# Patient Record
Sex: Female | Born: 1950 | Race: White | Hispanic: No | Smoking: Never smoker
Health system: Southern US, Community
[De-identification: ages and names within clinical notes are randomized; demographics above are authoritative.]

## PROBLEM LIST (undated history)

## (undated) DIAGNOSIS — N83209 Unspecified ovarian cyst, unspecified side: Secondary | ICD-10-CM

## (undated) DIAGNOSIS — I428 Other cardiomyopathies: Secondary | ICD-10-CM

## (undated) DIAGNOSIS — R9430 Abnormal result of cardiovascular function study, unspecified: Secondary | ICD-10-CM

## (undated) DIAGNOSIS — I340 Nonrheumatic mitral (valve) insufficiency: Secondary | ICD-10-CM

## (undated) DIAGNOSIS — Z7901 Long term (current) use of anticoagulants: Secondary | ICD-10-CM

## (undated) DIAGNOSIS — J449 Chronic obstructive pulmonary disease, unspecified: Secondary | ICD-10-CM

## (undated) DIAGNOSIS — I6509 Occlusion and stenosis of unspecified vertebral artery: Secondary | ICD-10-CM

## (undated) DIAGNOSIS — I480 Paroxysmal atrial fibrillation: Secondary | ICD-10-CM

## (undated) DIAGNOSIS — I639 Cerebral infarction, unspecified: Secondary | ICD-10-CM

## (undated) DIAGNOSIS — E039 Hypothyroidism, unspecified: Secondary | ICD-10-CM

## (undated) DIAGNOSIS — I509 Heart failure, unspecified: Secondary | ICD-10-CM

## (undated) DIAGNOSIS — K922 Gastrointestinal hemorrhage, unspecified: Secondary | ICD-10-CM

## (undated) DIAGNOSIS — Z9889 Other specified postprocedural states: Secondary | ICD-10-CM

## (undated) DIAGNOSIS — Z8679 Personal history of other diseases of the circulatory system: Secondary | ICD-10-CM

## (undated) DIAGNOSIS — M1990 Unspecified osteoarthritis, unspecified site: Secondary | ICD-10-CM

## (undated) DIAGNOSIS — I272 Pulmonary hypertension, unspecified: Secondary | ICD-10-CM

## (undated) DIAGNOSIS — B009 Herpesviral infection, unspecified: Secondary | ICD-10-CM

## (undated) DIAGNOSIS — M810 Age-related osteoporosis without current pathological fracture: Secondary | ICD-10-CM

## (undated) DIAGNOSIS — C50919 Malignant neoplasm of unspecified site of unspecified female breast: Secondary | ICD-10-CM

## (undated) DIAGNOSIS — G35 Multiple sclerosis: Secondary | ICD-10-CM

## (undated) DIAGNOSIS — K589 Irritable bowel syndrome without diarrhea: Secondary | ICD-10-CM

## (undated) DIAGNOSIS — F419 Anxiety disorder, unspecified: Secondary | ICD-10-CM

## (undated) DIAGNOSIS — Z952 Presence of prosthetic heart valve: Secondary | ICD-10-CM

## (undated) DIAGNOSIS — I493 Ventricular premature depolarization: Secondary | ICD-10-CM

## (undated) DIAGNOSIS — N319 Neuromuscular dysfunction of bladder, unspecified: Secondary | ICD-10-CM

## (undated) HISTORY — DX: Gastrointestinal hemorrhage, unspecified: K92.2

## (undated) HISTORY — DX: Age-related osteoporosis without current pathological fracture: M81.0

## (undated) HISTORY — DX: Irritable bowel syndrome, unspecified: K58.9

## (undated) HISTORY — DX: Ventricular premature depolarization: I49.3

## (undated) HISTORY — DX: Occlusion and stenosis of unspecified vertebral artery: I65.09

## (undated) HISTORY — DX: Pulmonary hypertension, unspecified: I27.20

## (undated) HISTORY — PX: LYMPHADENECTOMY: SHX15

## (undated) HISTORY — DX: Paroxysmal atrial fibrillation: I48.0

## (undated) HISTORY — DX: Other cardiomyopathies: I42.8

## (undated) HISTORY — DX: Herpesviral infection, unspecified: B00.9

## (undated) HISTORY — DX: Neuromuscular dysfunction of bladder, unspecified: N31.9

## (undated) HISTORY — DX: Chronic obstructive pulmonary disease, unspecified: J44.9

## (undated) HISTORY — DX: Cerebral infarction, unspecified: I63.9

## (undated) HISTORY — DX: Long term (current) use of anticoagulants: Z79.01

## (undated) HISTORY — DX: Anxiety disorder, unspecified: F41.9

## (undated) HISTORY — DX: Abnormal result of cardiovascular function study, unspecified: R94.30

---
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
## 1968-10-19 HISTORY — PX: TONSILLECTOMY: SUR1361

## 1976-10-19 HISTORY — PX: LAPAROSCOPIC OVARIAN CYSTECTOMY: SUR786

## 1990-10-19 HISTORY — PX: CYSTOSCOPY: SUR368

## 1997-10-19 DIAGNOSIS — C50919 Malignant neoplasm of unspecified site of unspecified female breast: Secondary | ICD-10-CM

## 1997-10-19 HISTORY — PX: BREAST LUMPECTOMY: SHX2

## 1997-10-19 HISTORY — DX: Malignant neoplasm of unspecified site of unspecified female breast: C50.919

## 1998-06-19 DIAGNOSIS — Z853 Personal history of malignant neoplasm of breast: Secondary | ICD-10-CM | POA: Insufficient documentation

## 1998-07-09 ENCOUNTER — Encounter: Admission: RE | Admit: 1998-07-09 | Discharge: 1998-10-07 | Payer: Self-pay | Admitting: Surgery

## 1998-07-19 ENCOUNTER — Ambulatory Visit (HOSPITAL_COMMUNITY): Admission: RE | Admit: 1998-07-19 | Discharge: 1998-07-20 | Payer: Self-pay | Admitting: Surgery

## 1998-07-21 ENCOUNTER — Emergency Department (HOSPITAL_COMMUNITY): Admission: EM | Admit: 1998-07-21 | Discharge: 1998-07-21 | Payer: Self-pay | Admitting: Emergency Medicine

## 1998-08-06 ENCOUNTER — Encounter: Payer: Self-pay | Admitting: Surgery

## 1998-08-06 ENCOUNTER — Ambulatory Visit (HOSPITAL_BASED_OUTPATIENT_CLINIC_OR_DEPARTMENT_OTHER): Admission: RE | Admit: 1998-08-06 | Discharge: 1998-08-06 | Payer: Self-pay | Admitting: Surgery

## 1998-08-07 ENCOUNTER — Ambulatory Visit (HOSPITAL_COMMUNITY): Admission: RE | Admit: 1998-08-07 | Discharge: 1998-08-07 | Payer: Self-pay | Admitting: Hematology and Oncology

## 1998-08-07 ENCOUNTER — Encounter: Payer: Self-pay | Admitting: Hematology and Oncology

## 1998-10-14 ENCOUNTER — Encounter: Admission: RE | Admit: 1998-10-14 | Discharge: 1999-01-12 | Payer: Self-pay | Admitting: Radiation Oncology

## 1998-11-08 ENCOUNTER — Ambulatory Visit (HOSPITAL_BASED_OUTPATIENT_CLINIC_OR_DEPARTMENT_OTHER): Admission: RE | Admit: 1998-11-08 | Discharge: 1998-11-08 | Payer: Self-pay | Admitting: Surgery

## 1999-09-17 ENCOUNTER — Encounter: Payer: Self-pay | Admitting: Hematology and Oncology

## 1999-09-17 ENCOUNTER — Encounter: Admission: RE | Admit: 1999-09-17 | Discharge: 1999-09-17 | Payer: Self-pay | Admitting: Hematology and Oncology

## 1999-10-01 ENCOUNTER — Encounter: Payer: Self-pay | Admitting: Hematology and Oncology

## 1999-11-28 ENCOUNTER — Ambulatory Visit (HOSPITAL_COMMUNITY): Admission: RE | Admit: 1999-11-28 | Discharge: 1999-11-28 | Payer: Self-pay | Admitting: Internal Medicine

## 2000-04-05 ENCOUNTER — Encounter: Payer: Self-pay | Admitting: Oncology

## 2000-04-05 ENCOUNTER — Encounter: Admission: RE | Admit: 2000-04-05 | Discharge: 2000-04-05 | Payer: Self-pay | Admitting: Oncology

## 2000-10-26 ENCOUNTER — Other Ambulatory Visit: Admission: RE | Admit: 2000-10-26 | Discharge: 2000-10-26 | Payer: Self-pay | Admitting: *Deleted

## 2001-11-23 ENCOUNTER — Other Ambulatory Visit: Admission: RE | Admit: 2001-11-23 | Discharge: 2001-11-23 | Payer: Self-pay | Admitting: *Deleted

## 2002-12-11 ENCOUNTER — Other Ambulatory Visit: Admission: RE | Admit: 2002-12-11 | Discharge: 2002-12-11 | Payer: Self-pay | Admitting: *Deleted

## 2003-08-28 ENCOUNTER — Ambulatory Visit (HOSPITAL_COMMUNITY): Admission: RE | Admit: 2003-08-28 | Discharge: 2003-08-28 | Payer: Self-pay | Admitting: Oncology

## 2004-01-22 ENCOUNTER — Other Ambulatory Visit: Admission: RE | Admit: 2004-01-22 | Discharge: 2004-01-22 | Payer: Self-pay | Admitting: *Deleted

## 2004-07-13 ENCOUNTER — Emergency Department (HOSPITAL_COMMUNITY): Admission: EM | Admit: 2004-07-13 | Discharge: 2004-07-13 | Payer: Self-pay | Admitting: Emergency Medicine

## 2004-11-18 ENCOUNTER — Ambulatory Visit: Payer: Self-pay | Admitting: Family Medicine

## 2004-12-05 ENCOUNTER — Ambulatory Visit: Payer: Self-pay | Admitting: Family Medicine

## 2005-02-03 ENCOUNTER — Ambulatory Visit: Payer: Self-pay | Admitting: Oncology

## 2005-04-07 ENCOUNTER — Ambulatory Visit: Payer: Self-pay | Admitting: Internal Medicine

## 2006-02-12 ENCOUNTER — Ambulatory Visit: Payer: Self-pay | Admitting: Oncology

## 2006-02-15 LAB — CBC WITH DIFFERENTIAL/PLATELET
BASO%: 0.7 % (ref 0.0–2.0)
EOS%: 1.5 % (ref 0.0–7.0)
MCH: 33.4 pg (ref 26.0–34.0)
MCHC: 34.8 g/dL (ref 32.0–36.0)
MCV: 96 fL (ref 81.0–101.0)
NEUT%: 61.9 % (ref 39.6–76.8)
RBC: 4.07 10*6/uL (ref 3.70–5.32)
RDW: 12.3 % (ref 11.3–14.5)

## 2006-02-15 LAB — COMPREHENSIVE METABOLIC PANEL
Alkaline Phosphatase: 51 U/L (ref 39–117)
BUN: 24 mg/dL — ABNORMAL HIGH (ref 6–23)
CO2: 27 mEq/L (ref 19–32)
Glucose, Bld: 94 mg/dL (ref 70–99)
Total Bilirubin: 0.4 mg/dL (ref 0.3–1.2)
Total Protein: 6.4 g/dL (ref 6.0–8.3)

## 2006-02-17 ENCOUNTER — Encounter: Payer: Self-pay | Admitting: Surgery

## 2006-03-04 ENCOUNTER — Ambulatory Visit: Payer: Self-pay | Admitting: Family Medicine

## 2006-03-18 ENCOUNTER — Ambulatory Visit: Payer: Self-pay | Admitting: Family Medicine

## 2006-05-14 ENCOUNTER — Ambulatory Visit: Payer: Self-pay | Admitting: Internal Medicine

## 2006-05-18 ENCOUNTER — Ambulatory Visit: Payer: Self-pay | Admitting: Family Medicine

## 2006-05-25 ENCOUNTER — Ambulatory Visit: Payer: Self-pay | Admitting: Family Medicine

## 2006-09-23 ENCOUNTER — Ambulatory Visit: Payer: Self-pay | Admitting: Family Medicine

## 2006-09-23 ENCOUNTER — Encounter: Admission: RE | Admit: 2006-09-23 | Discharge: 2006-09-23 | Payer: Self-pay | Admitting: Family Medicine

## 2006-11-30 ENCOUNTER — Ambulatory Visit: Payer: Self-pay | Admitting: Internal Medicine

## 2007-01-19 DIAGNOSIS — M949 Disorder of cartilage, unspecified: Secondary | ICD-10-CM

## 2007-01-19 DIAGNOSIS — Z8719 Personal history of other diseases of the digestive system: Secondary | ICD-10-CM

## 2007-01-19 DIAGNOSIS — R32 Unspecified urinary incontinence: Secondary | ICD-10-CM

## 2007-01-19 DIAGNOSIS — G35 Multiple sclerosis: Secondary | ICD-10-CM

## 2007-01-19 DIAGNOSIS — M899 Disorder of bone, unspecified: Secondary | ICD-10-CM | POA: Insufficient documentation

## 2007-01-19 DIAGNOSIS — Z87898 Personal history of other specified conditions: Secondary | ICD-10-CM

## 2007-01-19 DIAGNOSIS — L719 Rosacea, unspecified: Secondary | ICD-10-CM

## 2007-01-19 DIAGNOSIS — G35D Multiple sclerosis, unspecified: Secondary | ICD-10-CM | POA: Insufficient documentation

## 2007-02-09 ENCOUNTER — Ambulatory Visit: Payer: Self-pay | Admitting: Oncology

## 2007-02-14 LAB — CBC WITH DIFFERENTIAL/PLATELET
Basophils Absolute: 0 10*3/uL (ref 0.0–0.1)
EOS%: 1.8 % (ref 0.0–7.0)
HGB: 14.2 g/dL (ref 11.6–15.9)
MCH: 34.2 pg — ABNORMAL HIGH (ref 26.0–34.0)
MONO#: 0.6 10*3/uL (ref 0.1–0.9)
NEUT#: 2.8 10*3/uL (ref 1.5–6.5)
RDW: 12.7 % (ref 11.3–14.5)
WBC: 4.9 10*3/uL (ref 3.9–10.0)
lymph#: 1.3 10*3/uL (ref 0.9–3.3)

## 2007-02-14 LAB — COMPREHENSIVE METABOLIC PANEL
BUN: 20 mg/dL (ref 6–23)
Calcium: 9.5 mg/dL (ref 8.4–10.5)
Chloride: 105 mEq/L (ref 96–112)
Creatinine, Ser: 0.93 mg/dL (ref 0.40–1.20)
Glucose, Bld: 89 mg/dL (ref 70–99)
Potassium: 4 mEq/L (ref 3.5–5.3)
Total Bilirubin: 0.4 mg/dL (ref 0.3–1.2)

## 2007-02-14 LAB — CANCER ANTIGEN 27.29: CA 27.29: 39 U/mL (ref 0–39)

## 2007-04-20 ENCOUNTER — Ambulatory Visit: Payer: Self-pay | Admitting: Oncology

## 2007-04-26 LAB — CANCER ANTIGEN 27.29: CA 27.29: 27 U/mL (ref 0–39)

## 2007-11-18 ENCOUNTER — Encounter: Payer: Self-pay | Admitting: Family Medicine

## 2007-12-20 ENCOUNTER — Encounter: Payer: Self-pay | Admitting: Family Medicine

## 2008-02-15 ENCOUNTER — Ambulatory Visit: Payer: Self-pay | Admitting: Oncology

## 2008-02-20 LAB — CBC WITH DIFFERENTIAL/PLATELET
Basophils Absolute: 0 10*3/uL (ref 0.0–0.1)
Eosinophils Absolute: 0.1 10*3/uL (ref 0.0–0.5)
HCT: 39.3 % (ref 34.8–46.6)
LYMPH%: 28.3 % (ref 14.0–48.0)
MONO#: 0.5 10*3/uL (ref 0.1–0.9)
MONO%: 8.9 % (ref 0.0–13.0)
NEUT%: 61.4 % (ref 39.6–76.8)

## 2008-02-21 LAB — COMPREHENSIVE METABOLIC PANEL
AST: 18 U/L (ref 0–37)
Albumin: 4.1 g/dL (ref 3.5–5.2)
Alkaline Phosphatase: 69 U/L (ref 39–117)
BUN: 16 mg/dL (ref 6–23)
Calcium: 10.1 mg/dL (ref 8.4–10.5)
Creatinine, Ser: 0.89 mg/dL (ref 0.40–1.20)
Glucose, Bld: 102 mg/dL — ABNORMAL HIGH (ref 70–99)
Total Protein: 6.8 g/dL (ref 6.0–8.3)

## 2008-02-27 ENCOUNTER — Encounter: Payer: Self-pay | Admitting: Internal Medicine

## 2008-10-19 HISTORY — PX: COLONOSCOPY: SHX174

## 2008-12-21 ENCOUNTER — Encounter: Payer: Self-pay | Admitting: Family Medicine

## 2008-12-27 ENCOUNTER — Encounter: Payer: Self-pay | Admitting: Family Medicine

## 2009-02-05 ENCOUNTER — Ambulatory Visit: Payer: Self-pay | Admitting: Internal Medicine

## 2009-02-06 ENCOUNTER — Telehealth: Payer: Self-pay | Admitting: Internal Medicine

## 2009-02-15 ENCOUNTER — Ambulatory Visit: Payer: Self-pay | Admitting: Oncology

## 2009-02-22 ENCOUNTER — Ambulatory Visit: Payer: Self-pay | Admitting: Internal Medicine

## 2009-02-26 ENCOUNTER — Encounter: Payer: Self-pay | Admitting: Internal Medicine

## 2009-02-26 ENCOUNTER — Encounter: Payer: Self-pay | Admitting: Family Medicine

## 2010-02-18 ENCOUNTER — Ambulatory Visit: Payer: Self-pay | Admitting: Oncology

## 2010-02-19 ENCOUNTER — Encounter: Payer: Self-pay | Admitting: Family Medicine

## 2010-02-19 LAB — CBC WITH DIFFERENTIAL/PLATELET
BASO%: 0.3 % (ref 0.0–2.0)
Basophils Absolute: 0 10*3/uL (ref 0.0–0.1)
EOS%: 1.4 % (ref 0.0–7.0)
MCH: 33 pg (ref 25.1–34.0)
MCHC: 34.5 g/dL (ref 31.5–36.0)
MCV: 95.8 fL (ref 79.5–101.0)
MONO%: 8.4 % (ref 0.0–14.0)
NEUT#: 4.6 10*3/uL (ref 1.5–6.5)
NEUT%: 71.4 % (ref 38.4–76.8)
RDW: 13.1 % (ref 11.2–14.5)
WBC: 6.4 10*3/uL (ref 3.9–10.3)

## 2010-02-19 LAB — COMPREHENSIVE METABOLIC PANEL
AST: 17 U/L (ref 0–37)
Alkaline Phosphatase: 61 U/L (ref 39–117)
BUN: 21 mg/dL (ref 6–23)
Creatinine, Ser: 0.93 mg/dL (ref 0.40–1.20)
Potassium: 4 mEq/L (ref 3.5–5.3)

## 2010-02-26 ENCOUNTER — Encounter: Payer: Self-pay | Admitting: Internal Medicine

## 2010-03-05 ENCOUNTER — Encounter: Payer: Self-pay | Admitting: Family Medicine

## 2010-10-19 HISTORY — PX: WRIST SURGERY: SHX841

## 2010-11-18 NOTE — Letter (Signed)
[facility]   Imported By: Sherian Rein 03/27/2010 13:55:29  _____________________________________________________________________  External Attachment:    Type:   Image     Comment:   External Document

## 2010-11-18 NOTE — Letter (Signed)
Summary: [facility]   Imported By: Lanelle Bal 03/26/2010 10:54:16  _____________________________________________________________________  External Attachment:    Type:   Image     Comment:   External Document

## 2010-12-15 ENCOUNTER — Telehealth: Payer: Self-pay | Admitting: Family Medicine

## 2010-12-25 NOTE — Progress Notes (Signed)
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 Pt is due to have some dental work done and would like to know if she needs to be premedicated. Pt note that in the pass for a while she was premedicated but here lately she has not. Pt has not been seen in years by you pls advise if you would prefer to see Pt. Pls advise...Marland KitchenMarland KitchenFelecia Deloach CMA  December 15, 2010 10:08 AM   Follow-up for Phone Call        now no premedication is needed but since she has not been seen here in a while ---I don't know what has been done since I have seen her---which has been several years---she would need to be seen for me to evaluated her and do appropriate testing.   Follow-up by: Loreen Freud DO,  December 15, 2010 11:30 AM  Additional Follow-up for Phone Call Additional follow up Details #1::        spoke with pt and I advised of above-- stated she sees a functional medicine doctor who gives her a physical every year with labs, I advised patient if he is acting as her PCP then she needs to call there for medical advice, unless she is going to continue her medical care there, she voiced understanding... Stated she may come back to see Dr.Lowne soon, but since Dr. Laury Axon said no she will go to her dental appt, I advised she needed to get medical advice from her pcp, she thank me and ended call Additional Follow-up by: Almeta Monas CMA Duncan Dull),  December 15, 2010 11:57 AM

## 2011-01-17 ENCOUNTER — Emergency Department (HOSPITAL_BASED_OUTPATIENT_CLINIC_OR_DEPARTMENT_OTHER)
Admission: EM | Admit: 2011-01-17 | Discharge: 2011-01-17 | Disposition: A | Discharge status: Home / Self Care | Payer: Medicare Other | Attending: Emergency Medicine | Admitting: Emergency Medicine

## 2011-01-17 DIAGNOSIS — E039 Hypothyroidism, unspecified: Secondary | ICD-10-CM | POA: Insufficient documentation

## 2011-01-17 DIAGNOSIS — W268XXA Contact with other sharp object(s), not elsewhere classified, initial encounter: Secondary | ICD-10-CM | POA: Insufficient documentation

## 2011-01-17 DIAGNOSIS — Y92009 Unspecified place in unspecified non-institutional (private) residence as the place of occurrence of the external cause: Secondary | ICD-10-CM | POA: Insufficient documentation

## 2011-01-17 DIAGNOSIS — S61209A Unspecified open wound of unspecified finger without damage to nail, initial encounter: Secondary | ICD-10-CM | POA: Insufficient documentation

## 2011-02-02 ENCOUNTER — Emergency Department (HOSPITAL_BASED_OUTPATIENT_CLINIC_OR_DEPARTMENT_OTHER)
Admission: EM | Admit: 2011-02-02 | Discharge: 2011-02-02 | Disposition: A | Discharge status: Home / Self Care | Payer: Medicare Other | Attending: Emergency Medicine | Admitting: Emergency Medicine

## 2011-02-02 DIAGNOSIS — H1010 Acute atopic conjunctivitis, unspecified eye: Secondary | ICD-10-CM | POA: Insufficient documentation

## 2011-02-02 DIAGNOSIS — E039 Hypothyroidism, unspecified: Secondary | ICD-10-CM | POA: Insufficient documentation

## 2011-02-19 ENCOUNTER — Other Ambulatory Visit: Payer: Self-pay | Admitting: Oncology

## 2011-02-19 ENCOUNTER — Encounter (HOSPITAL_BASED_OUTPATIENT_CLINIC_OR_DEPARTMENT_OTHER): Payer: Medicare Other | Admitting: Oncology

## 2011-02-19 DIAGNOSIS — C50919 Malignant neoplasm of unspecified site of unspecified female breast: Secondary | ICD-10-CM

## 2011-02-19 DIAGNOSIS — Z170 Estrogen receptor positive status [ER+]: Secondary | ICD-10-CM

## 2011-02-19 LAB — COMPREHENSIVE METABOLIC PANEL
BUN: 23 mg/dL (ref 6–23)
CO2: 31 mEq/L (ref 19–32)
Calcium: 10.1 mg/dL (ref 8.4–10.5)
Chloride: 98 mEq/L (ref 96–112)
Creatinine, Ser: 0.82 mg/dL (ref 0.40–1.20)
Glucose, Bld: 100 mg/dL — ABNORMAL HIGH (ref 70–99)

## 2011-02-19 LAB — CBC WITH DIFFERENTIAL/PLATELET
BASO%: 0.9 % (ref 0.0–2.0)
EOS%: 1.6 % (ref 0.0–7.0)
HCT: 40.3 % (ref 34.8–46.6)
LYMPH%: 25.7 % (ref 14.0–49.7)
MCH: 33.2 pg (ref 25.1–34.0)
MCHC: 35 g/dL (ref 31.5–36.0)
MCV: 94.8 fL (ref 79.5–101.0)
MONO%: 10.9 % (ref 0.0–14.0)
NEUT%: 60.9 % (ref 38.4–76.8)
lymph#: 1.3 10*3/uL (ref 0.9–3.3)

## 2011-02-19 LAB — CANCER ANTIGEN 27.29: CA 27.29: 36 U/mL (ref 0–39)

## 2011-02-24 ENCOUNTER — Emergency Department (HOSPITAL_COMMUNITY): Payer: Medicare Other

## 2011-02-24 ENCOUNTER — Inpatient Hospital Stay (HOSPITAL_COMMUNITY)
Admission: EM | Admit: 2011-02-24 | Discharge: 2011-03-03 | DRG: 512 | Disposition: A | Discharge status: Home / Self Care | Payer: Medicare Other | Attending: Orthopedic Surgery | Admitting: Orthopedic Surgery

## 2011-02-24 DIAGNOSIS — W19XXXA Unspecified fall, initial encounter: Secondary | ICD-10-CM | POA: Diagnosis present

## 2011-02-24 DIAGNOSIS — Z853 Personal history of malignant neoplasm of breast: Secondary | ICD-10-CM

## 2011-02-24 DIAGNOSIS — S52509B Unspecified fracture of the lower end of unspecified radius, initial encounter for open fracture type I or II: Principal | ICD-10-CM | POA: Diagnosis present

## 2011-02-24 LAB — BASIC METABOLIC PANEL
BUN: 20 mg/dL (ref 6–23)
Calcium: 9.5 mg/dL (ref 8.4–10.5)
Creatinine, Ser: 0.64 mg/dL (ref 0.4–1.2)
GFR calc Af Amer: >60 mL/min (ref >60)
GFR calc non Af Amer: >60 mL/min (ref >60)

## 2011-02-24 LAB — CBC
HCT: 38.6 % (ref 36.0–46.0)
Hemoglobin: 13.3 g/dL (ref 12.0–15.0)
MCH: 32.2 pg (ref 26.0–34.0)
MCHC: 34.5 g/dL (ref 30.0–36.0)
MCV: 93.5 fL (ref 78.0–100.0)
Platelets: 205 10*3/uL (ref 150–400)
RDW: 12.6 % (ref 11.5–15.5)

## 2011-02-24 LAB — DIFFERENTIAL
Lymphocytes Relative: 6 % — ABNORMAL LOW (ref 12–46)
Lymphs Abs: 0.7 10*3/uL (ref 0.7–4.0)
Monocytes Absolute: 0.6 10*3/uL (ref 0.1–1.0)
Monocytes Relative: 5 % (ref 3–12)
Neutro Abs: 11.4 10*3/uL — ABNORMAL HIGH (ref 1.7–7.7)

## 2011-03-06 NOTE — Assessment & Plan Note (Signed)
[facility]                         GASTROENTEROLOGY OFFICE NOTE   NAME:Poche, ESMA KILTS                     MRN:          [id number]  DATE:11/30/2006                            DOB:          [birth year]    HISTORY OF PRESENT ILLNESS:  Ms. Abdelrahman is a 60 year old white female  who is due to a colonoscopy. She is here to discuss colonoscopy  procedure. She has a history of breast cancer and family history of  colon cancer. She was actually scheduled for a colonoscopy 3 years ago  but because of diarrhea, she wanted to wait until her symptoms  stabilized. We did her colonoscopy in February 2001 and she had a normal  examination. Since her last visit, her symptoms have stabilized. She is  now having formed stools, mostly due to taking in fiber and taking pro-  biotics. She has been improved only for the last 4 weeks. Prior to that,  her stools were rather loose.   We have discussed colonoscopy today. She will be ready for it in June  2008. She will, for the time being, continue her high fiber diet, then  pro-biotics. She has requested a prescription for EMLA cream 2.5%/2.5%,  to use 1 hour prior to the procedure, which she used before her IV  insertion for colonoscopy. We will send her recall letter in May 2008.     Hedwig Morton. Juanda Chance, MD  Electronically Signed    DMB/MedQ  DD: 11/30/2006  DT: 11/30/2006  Job #: [id number]

## 2011-03-12 NOTE — Discharge Summary (Signed)
[name], [name]              ACCOUNT NO.:  [id number]  MEDICAL RECORD NO.:  [id number]           PATIENT TYPE:  LOCATION:                                 FACILITY:  PHYSICIAN:  Dionne Ano. Ranvir Renovato, M.D.DATE OF BIRTH:  1951/08/11  DATE OF ADMISSION:  02/24/2011 DATE OF DISCHARGE:                              DISCHARGE SUMMARY   DISCHARGE SUMMARY:  [name] [name] is a very pleasant 60 year old female, who fell and sustaining a comminuted complex intraarticular distal, radius, and ulna fracture, right upper extremity.  She was admitted to the emergency room to [facility], Feb 24, 2011.  She was immediately taken to the operative arena due to the type 1 open fracture.  She underwent irrigation and debridement of her type 1 open ulnar fracture and following this underwent reconstruction of her right radius with a spanning plate and allograft bone graft.  The patient tolerated the procedure well.  She has a history of breast cancer, thyroid disease, and surgical history that includes breast surgery, tonsillectomy, and navel rupture that was treated in 1952.  She does not smoke or drink.  She has a history of drug allergy to ERYTHROMYCIN.  She takes thyroid medicine and hormone replacement.  During her hospital course, she was admitted and underwent surgery the night of her injury as described above.  Postoperatively, she did very well.  She was able to move her fingers.  Had good sensation.  No neurovascular compromise.  [name] [name] matriculated through her postoperative course with a gentle weaning of her PCA pain pump and O2 on today's date Feb 27, 2011.  She was stable, awake, alert, and oriented.  Chest is clear.  Abdomen is nontender.  She is tolerating a regular diet.  She has no signs of DVT, infection, or vascular compromise, and lower extremity examination is benign.  Her right upper extremity remains neurovascularly intact and quite pleased with her response to  treatment thus far.  Given her independent living situation and the injury that she sustained, it is felt that she would be better suited to a skilled level of care and we are going to try to arrange for her to be in a skilled facility and her mother who is at counter side in [place].  We are making arrangements at the present time for this.  She is going to be discharged on Percocet 5 mg one to two q.4-6 h. p.r.n. pain p.o., Robaxin 500 mg one q.6 h. p.r.n. spasm, vitamin C 1000 mg a day, Senokot or Peri-Colace p.r.n. to prevent constipation.  In addition of this, we are going to have her continue her regular vitamin, supplements, and regimes and to continue her thyroid medicine and her hormone replacement.  FINAL DISCHARGE DIAGNOSIS:  Status post open treatment type 1 open right distal radius fracture, treated with spanning plate about the radius and I and D of the ulna.  She is going to be following up in 10-14 days in my office.  We are going to take serial radiographs.  Ultimately, the spanning plate will need to be removed and we are going to need to watch  the ulnar fracture closely.  Occasionally, if this displaces, we will have to perform additional secondary fixation, but for now, she looks very well maintained.  I have discussed with [name] [name] the use of sling when she is up out of bed only to walk with a cane for support and would recommend therapy when she is at the skilled nursing so that she get her balance to normal and feel comfortable with an independent living status.  Once she is stable, comfortable with independent living, we will transfer her back into her independent living situation.  I have discussed these issues at length.  These notes etc., and all questions have been encouraged and answered.  It is my pleasure to see and treat [name] [name].  We look forward to participating in her follow-up care.  She is a delightful young lady who had a  very unfortunate fracture.  All questions have been encouraged and answered.     Dionne Ano. Amanda Pea, M.D.     [facility]  D:  02/27/2011  T:  02/27/2011  Job:  [id number]  Electronically Signed by Dominica Severin M.D. on 03/12/2011 09:46:29 PM

## 2011-03-12 NOTE — Op Note (Signed)
NAMEABAGAEL, KRAMM              ACCOUNT NO.:  [id number]  MEDICAL RECORD NO.:  [id number]           PATIENT TYPE:  O  LOCATION:  3013                         FACILITY:  [facility]  PHYSICIAN:  Dionne Ano. Welles Walthall, M.D.DATE OF BIRTH:  1951-05-13  DATE OF PROCEDURE:  02/24/2011 DATE OF DISCHARGE:                              OPERATIVE REPORT   PREOPERATIVE DIAGNOSES: 1. Type 1 open ulna fracture distally, right upper extremity. 2. Comminuted complex intraarticular right distal radius fracture,     right wrist.  POSTOPERATIVE DIAGNOSES: 1. Type 1 open ulna fracture distally, right upper extremity. 2. Comminuted complex intraarticular right distal radius fracture,     right wrist.  PROCEDURE.: 1. Irrigation and debridement of type 1 open ulna comminuted distal     shaft fracture at the wrist region. 2. Open treatment, ulna shaft/distal ulna fracture, right upper     extremity. 3. Open reduction with internal fixation and a spanning plate, right     radius fracture, comminuted intraarticular greater than 5 part with     allograft bone grafting. 4. Extensor pollicis longus decompression and transposition in soft     tissues. 5. Posterior interosseous nerve neurectomy, right wrist. 6. Stress radiography.  SURGEON:  Dionne Ano. Amanda Pea, MD  ASSISTANT:  None.  COMPLICATIONS:  None.  ANESTHESIA:  General.  TOURNIQUET TIME:  Less than an hour.  INDICATIONS FOR PROCEDURE:  This patient is a pleasant female who presents with the above-mentioned diagnosis.  I have counseled her in regards to risks and benefits of surgery including risk of infection, bleeding, anesthesia, damage to normal structures, and failure of surgery to accomplish its intended goals of relieving symptoms and restoring function.  With this in mind, she desires to proceed.  All questions have been encouraged and answered and addressed preoperatively with the patient.  OPERATIVE PROCEDURE IN DETAIL:  The patient  was seen by myself and Anesthesia.  She was counseled in the preop holding area.  I discussed the risk of nonunion, malunion, need for total wrist fusion, revision surgery, secondary reconstruction, and need for secondary fixation procedures, etc. Given her very horrible fracture pattern, I feel that she has a high risk of problems into the future.  I do feel that she will have progressive arthritis and discussed these issues with her open and honestly.  She understood this and desired to proceed with the above- mentioned surgical intervention.  The patient was seen by myself and Anesthesia.  She was given Ancef in the operative arena.  She was also given preoperative Ancef and her tetanus was updated.  The patient had very careful and cautious general anesthesia induced.  Following this, time-out was called.  The arm was then identified by myself.  Tourniquet was applied, a 1000 drape placed around the tourniquet.  She was prepped and draped in the usual sterile fashion, Betadine scrub and paint about the right upper extremity.  Once this was done, I then made an incision over the open wound.  This was an open distal ulna fracture.  The patient had this wound enlarged. Dissection was carried down.  Bone was identified and exposed,  curettage, and 3 liters of saline were placed in the wound.  This was an I and D of an open fracture and open treatment of the distal ulna shaft fracture near the distal radial ulnar joint.  Following this, given the comminution and evaluation under stress radiography and preoperatively, it was very apparent that the patient would require more than a simple plate and screw construct.  I set out to perform a spanning wrist plate for an internal and external fixator- type apparatus.  Incision was made dorsally.  Dissection was carried down, EPL was identified and removed out of its confines where bony shards were present and hematoma was then evacuated,  followed by transfer of the EPL to the dorsal soft tissues.  Once this was done, I placed a DePuy spanning plate in line with the second metacarpal. Second metacarpal screws were placed, first combination locking and regular screws were placed.  Once this was done, I then took tension off the bone and made an exposure and performed open reduction with allograft bone grafting.  Cancellous bone chips and graft on derivative were placed into this area.  The patient tolerated this well.  I was able achieve an excellent reduction.  She had a little bit a volar prominence, but I felt this was stable given the nature of the pronator which was noted to be intact from the initial evaluation that was performed with my I and D of the open volar ulnar wound.  At this time, I copiously packed bone into the defect, recreated the joint anatomy.  This looked well in AP and lateral views.  I took tension off the traction and made sure that the bone graft set nicely and then applied screws in the proximal plate.  Thus, a spanning wrist plate was placed achieving excellent cortices proximal and distal to the zone of injury which is very comminuted greater than 5 part and was treated with open reduction technique and allograft bone grafting.  At this time, I then closed the fascia and retinaculum over the plate so that there be good coverage and no extensor tendon adherence or impingement against plate.  A posterior interosseous nerve neurectomy was accomplished as well carefully.  The fourth dorsal compartment was stable as was the second.  At this time, I closed wound with far-near- near-far Prolene sutures of 3 and 4 variety.  Once this was done, I tested her out.  She had a remarkably stable distal radioulnar joint even though there was a horrible amount of comminution.  There was no grinding, popping or catching.  Given the swelling and her injury as well as the alignment, I felt that deferring any  further fixation for the ulna would be wise.  At this time, I checked her multiple times, took final copy x-rays.  The distal radioulnar joint actually looked very impressively good given the comminution and poor characteristics. With this noted, we then performed very careful and close placement of sterile bandages followed by a long-arm splint after dressings were placed.  She was taken to recovery room.  I plan for Ancef and gentamicin, elevation, ice, finger range of motion, my standard postop algorithm for a distal radius fracture and reconstruction.  We will keep a spanning plate on to she is completely healed and should any problems or questions concern, she is going to notify me immediately.  I have discussed with the patient all issues and I will call her friends at her request.  I would give her  variable prognosis. She is very osteoporotic, horrible comminution was noted, ample bone graft was placed and although there were no immediate complications, I do feel she has a very long and difficult road ahead of her.  These notes were discussed and all questions encouraged and answered.     Dionne Ano. Amanda Pea, M.D.     [facility]  D:  02/25/2011  T:  02/25/2011  Job:  [id number]  Electronically Signed by Dominica Severin M.D. on 03/12/2011 09:46:34 PM

## 2011-04-14 ENCOUNTER — Emergency Department (HOSPITAL_BASED_OUTPATIENT_CLINIC_OR_DEPARTMENT_OTHER)
Admission: EM | Admit: 2011-04-14 | Discharge: 2011-04-14 | Disposition: A | Discharge status: Home / Self Care | Payer: Medicare Other | Attending: Emergency Medicine | Admitting: Emergency Medicine

## 2011-04-14 ENCOUNTER — Emergency Department (INDEPENDENT_AMBULATORY_CARE_PROVIDER_SITE_OTHER): Payer: Medicare Other

## 2011-04-14 DIAGNOSIS — S2249XA Multiple fractures of ribs, unspecified side, initial encounter for closed fracture: Secondary | ICD-10-CM | POA: Insufficient documentation

## 2011-04-14 DIAGNOSIS — S5290XD Unspecified fracture of unspecified forearm, subsequent encounter for closed fracture with routine healing: Secondary | ICD-10-CM

## 2011-04-14 DIAGNOSIS — Y92009 Unspecified place in unspecified non-institutional (private) residence as the place of occurrence of the external cause: Secondary | ICD-10-CM | POA: Insufficient documentation

## 2011-04-14 DIAGNOSIS — M25539 Pain in unspecified wrist: Secondary | ICD-10-CM

## 2011-04-14 DIAGNOSIS — S20219A Contusion of unspecified front wall of thorax, initial encounter: Secondary | ICD-10-CM

## 2011-04-14 DIAGNOSIS — S5010XA Contusion of unspecified forearm, initial encounter: Secondary | ICD-10-CM | POA: Insufficient documentation

## 2011-04-14 DIAGNOSIS — W19XXXA Unspecified fall, initial encounter: Secondary | ICD-10-CM | POA: Insufficient documentation

## 2011-04-14 DIAGNOSIS — E039 Hypothyroidism, unspecified: Secondary | ICD-10-CM | POA: Insufficient documentation

## 2011-04-14 DIAGNOSIS — G35 Multiple sclerosis: Secondary | ICD-10-CM | POA: Insufficient documentation

## 2011-04-14 DIAGNOSIS — R0789 Other chest pain: Secondary | ICD-10-CM

## 2011-08-04 ENCOUNTER — Other Ambulatory Visit: Payer: Self-pay | Admitting: Physician Assistant

## 2011-12-17 DIAGNOSIS — Z472 Encounter for removal of internal fixation device: Secondary | ICD-10-CM | POA: Diagnosis not present

## 2011-12-17 DIAGNOSIS — S42309S Unspecified fracture of shaft of humerus, unspecified arm, sequela: Secondary | ICD-10-CM | POA: Diagnosis not present

## 2011-12-17 DIAGNOSIS — M659 Synovitis and tenosynovitis, unspecified: Secondary | ICD-10-CM | POA: Diagnosis not present

## 2011-12-17 DIAGNOSIS — T84498A Other mechanical complication of other internal orthopedic devices, implants and grafts, initial encounter: Secondary | ICD-10-CM | POA: Diagnosis not present

## 2011-12-28 DIAGNOSIS — S52599A Other fractures of lower end of unspecified radius, initial encounter for closed fracture: Secondary | ICD-10-CM | POA: Diagnosis not present

## 2012-01-06 DIAGNOSIS — S52599A Other fractures of lower end of unspecified radius, initial encounter for closed fracture: Secondary | ICD-10-CM | POA: Diagnosis not present

## 2012-01-13 DIAGNOSIS — S52599A Other fractures of lower end of unspecified radius, initial encounter for closed fracture: Secondary | ICD-10-CM | POA: Diagnosis not present

## 2012-01-16 ENCOUNTER — Other Ambulatory Visit: Payer: Self-pay

## 2012-01-16 ENCOUNTER — Inpatient Hospital Stay (HOSPITAL_BASED_OUTPATIENT_CLINIC_OR_DEPARTMENT_OTHER)
Admission: EM | Admit: 2012-01-16 | Discharge: 2012-02-03 | DRG: 217 | Disposition: A | Discharge status: Skilled Nursing Facility | Payer: Medicare Other | Attending: Thoracic Surgery (Cardiothoracic Vascular Surgery) | Admitting: Thoracic Surgery (Cardiothoracic Vascular Surgery)

## 2012-01-16 ENCOUNTER — Encounter (HOSPITAL_BASED_OUTPATIENT_CLINIC_OR_DEPARTMENT_OTHER): Payer: Self-pay | Admitting: *Deleted

## 2012-01-16 ENCOUNTER — Emergency Department (INDEPENDENT_AMBULATORY_CARE_PROVIDER_SITE_OTHER): Payer: Medicare Other

## 2012-01-16 DIAGNOSIS — J438 Other emphysema: Secondary | ICD-10-CM | POA: Diagnosis not present

## 2012-01-16 DIAGNOSIS — R05 Cough: Secondary | ICD-10-CM

## 2012-01-16 DIAGNOSIS — R0989 Other specified symptoms and signs involving the circulatory and respiratory systems: Secondary | ICD-10-CM | POA: Diagnosis not present

## 2012-01-16 DIAGNOSIS — E876 Hypokalemia: Secondary | ICD-10-CM

## 2012-01-16 DIAGNOSIS — I4891 Unspecified atrial fibrillation: Secondary | ICD-10-CM | POA: Diagnosis not present

## 2012-01-16 DIAGNOSIS — I059 Rheumatic mitral valve disease, unspecified: Secondary | ICD-10-CM

## 2012-01-16 DIAGNOSIS — L719 Rosacea, unspecified: Secondary | ICD-10-CM | POA: Diagnosis present

## 2012-01-16 DIAGNOSIS — Z7901 Long term (current) use of anticoagulants: Secondary | ICD-10-CM

## 2012-01-16 DIAGNOSIS — I509 Heart failure, unspecified: Secondary | ICD-10-CM | POA: Diagnosis not present

## 2012-01-16 DIAGNOSIS — F411 Generalized anxiety disorder: Secondary | ICD-10-CM | POA: Diagnosis present

## 2012-01-16 DIAGNOSIS — N39498 Other specified urinary incontinence: Secondary | ICD-10-CM | POA: Diagnosis present

## 2012-01-16 DIAGNOSIS — Z8679 Personal history of other diseases of the circulatory system: Secondary | ICD-10-CM

## 2012-01-16 DIAGNOSIS — I2789 Other specified pulmonary heart diseases: Secondary | ICD-10-CM | POA: Diagnosis present

## 2012-01-16 DIAGNOSIS — J449 Chronic obstructive pulmonary disease, unspecified: Secondary | ICD-10-CM

## 2012-01-16 DIAGNOSIS — Z853 Personal history of malignant neoplasm of breast: Secondary | ICD-10-CM | POA: Diagnosis present

## 2012-01-16 DIAGNOSIS — D62 Acute posthemorrhagic anemia: Secondary | ICD-10-CM | POA: Diagnosis not present

## 2012-01-16 DIAGNOSIS — N319 Neuromuscular dysfunction of bladder, unspecified: Secondary | ICD-10-CM | POA: Diagnosis present

## 2012-01-16 DIAGNOSIS — F419 Anxiety disorder, unspecified: Secondary | ICD-10-CM | POA: Diagnosis present

## 2012-01-16 DIAGNOSIS — I272 Pulmonary hypertension, unspecified: Secondary | ICD-10-CM | POA: Diagnosis present

## 2012-01-16 DIAGNOSIS — M899 Disorder of bone, unspecified: Secondary | ICD-10-CM | POA: Diagnosis present

## 2012-01-16 DIAGNOSIS — G35 Multiple sclerosis: Secondary | ICD-10-CM | POA: Diagnosis present

## 2012-01-16 DIAGNOSIS — R918 Other nonspecific abnormal finding of lung field: Secondary | ICD-10-CM

## 2012-01-16 DIAGNOSIS — J439 Emphysema, unspecified: Secondary | ICD-10-CM | POA: Diagnosis present

## 2012-01-16 DIAGNOSIS — I498 Other specified cardiac arrhythmias: Secondary | ICD-10-CM

## 2012-01-16 DIAGNOSIS — Z881 Allergy status to other antibiotic agents status: Secondary | ICD-10-CM

## 2012-01-16 DIAGNOSIS — R000 Tachycardia, unspecified: Secondary | ICD-10-CM | POA: Diagnosis present

## 2012-01-16 DIAGNOSIS — G35D Multiple sclerosis, unspecified: Secondary | ICD-10-CM | POA: Diagnosis present

## 2012-01-16 DIAGNOSIS — Z952 Presence of prosthetic heart valve: Secondary | ICD-10-CM

## 2012-01-16 DIAGNOSIS — E039 Hypothyroidism, unspecified: Secondary | ICD-10-CM | POA: Diagnosis present

## 2012-01-16 DIAGNOSIS — R0902 Hypoxemia: Secondary | ICD-10-CM | POA: Diagnosis present

## 2012-01-16 DIAGNOSIS — E873 Alkalosis: Secondary | ICD-10-CM | POA: Diagnosis not present

## 2012-01-16 DIAGNOSIS — E441 Mild protein-calorie malnutrition: Secondary | ICD-10-CM | POA: Diagnosis not present

## 2012-01-16 DIAGNOSIS — I440 Atrioventricular block, first degree: Secondary | ICD-10-CM | POA: Insufficient documentation

## 2012-01-16 DIAGNOSIS — Z79899 Other long term (current) drug therapy: Secondary | ICD-10-CM

## 2012-01-16 DIAGNOSIS — R32 Unspecified urinary incontinence: Secondary | ICD-10-CM | POA: Diagnosis present

## 2012-01-16 HISTORY — DX: Malignant neoplasm of unspecified site of unspecified female breast: C50.919

## 2012-01-16 HISTORY — DX: Personal history of other diseases of the circulatory system: Z86.79

## 2012-01-16 HISTORY — DX: Multiple sclerosis: G35

## 2012-01-16 HISTORY — DX: Nonrheumatic mitral (valve) insufficiency: I34.0

## 2012-01-16 HISTORY — DX: Unspecified ovarian cyst, unspecified side: N83.209

## 2012-01-16 HISTORY — DX: Hypothyroidism, unspecified: E03.9

## 2012-01-16 HISTORY — DX: Heart failure, unspecified: I50.9

## 2012-01-16 HISTORY — DX: Presence of prosthetic heart valve: Z95.2

## 2012-01-16 HISTORY — DX: Other specified postprocedural states: Z98.890

## 2012-01-16 HISTORY — DX: Unspecified osteoarthritis, unspecified site: M19.90

## 2012-01-16 LAB — BASIC METABOLIC PANEL
BUN: 25 mg/dL — ABNORMAL HIGH (ref 6–23)
Calcium: 9.5 mg/dL (ref 8.4–10.5)
Creatinine, Ser: 0.6 mg/dL (ref 0.50–1.10)
GFR calc Af Amer: >90 mL/min (ref >90)
GFR calc non Af Amer: >90 mL/min (ref >90)

## 2012-01-16 LAB — CBC
MCHC: 34.4 g/dL (ref 30.0–36.0)
Platelets: 196 10*3/uL (ref 150–400)
RDW: 14 % (ref 11.5–15.5)
WBC: 14.8 10*3/uL — ABNORMAL HIGH (ref 4.0–10.5)

## 2012-01-16 LAB — DIFFERENTIAL
Basophils Absolute: 0 10*3/uL (ref 0.0–0.1)
Basophils Relative: 0 % (ref 0–1)
Monocytes Absolute: 1.6 10*3/uL — ABNORMAL HIGH (ref 0.1–1.0)
Neutro Abs: 11.6 10*3/uL — ABNORMAL HIGH (ref 1.7–7.7)
Neutrophils Relative %: 78 % — ABNORMAL HIGH (ref 43–77)

## 2012-01-16 LAB — PRO B NATRIURETIC PEPTIDE: Pro B Natriuretic peptide (BNP): 7570 pg/mL — ABNORMAL HIGH (ref 0–125)

## 2012-01-16 LAB — TROPONIN I: Troponin I: <0.3 ng/mL (ref <0.30)

## 2012-01-16 MED ORDER — FUROSEMIDE 20 MG PO TABS
20.0000 mg | ORAL_TABLET | Freq: Once | ORAL | Status: AC
  Administered 2012-01-16: 20 mg via ORAL
  Filled 2012-01-16: qty 1

## 2012-01-16 MED ORDER — NITROGLYCERIN 2 % TD OINT
1.0000 [in_us] | TOPICAL_OINTMENT | Freq: Once | TRANSDERMAL | Status: AC
  Administered 2012-01-16: 1 [in_us] via TOPICAL
  Filled 2012-01-16: qty 1

## 2012-01-16 NOTE — ED Notes (Signed)
MD at bedside. 

## 2012-01-16 NOTE — ED Provider Notes (Addendum)
This chart was scribed for Doug Sou, MD by Williemae Natter. The patient was seen in room MH10/MH10 at 7:38 PM.  CSN: [id number]  Arrival date & time 01/16/12  1845   First MD Initiated Contact with Patient 01/16/12 1936      Chief Complaint  Patient presents with  . Shortness of Breath    (Consider location/radiation/quality/duration/timing/severity/associated sxs/prior treatment) HPI [name] [name] is a 61 y.o. female who presents to the Emergency Department complaining of acute onset moderate to severe shortness of breath for one day. Pt has an associated shallow nonproductive cough for one week. Pt reports that she hears crunchy sound when exhaling. Hot flashes at night. Pt is short of breath. No recorded fever. Laying flat makes it worse. Sitting up makes it better. No pain. No treatment prior to coming here to Pt is on thyroid and has hx of MS. Pt has had tonsillectomy, laparoscopy, hernia surgery, flexible cystoscopy, lumpectomy in 97, and lymphectomy. Pt fell and broke her arm. Pt does not smoke or drink. Pt is allergic to erythromycin. Pt reports getting "almost nauseated" in the mornings. Pt is more lethargic than usual. Family history of CHF. PCP-Dr. Ludwig Clarks Past Medical History  Diagnosis Date  . Multiple sclerosis   . Breast cancer   . Ovarian cyst     Past Surgical History  Procedure Date  . Breast lumpectomy   . Lymphadenectomy   . Wrist surgery   . Hernia repair   . Tonsillectomy   . Cystoscopy   . Laparoscopy     No family history on file.  History  Substance Use Topics  . Smoking status: Never Smoker   . Smokeless tobacco: Never Used  . Alcohol Use: No    OB History    Grav Para Term Preterm Abortions TAB SAB Ect Mult Living                  Review of Systems  Respiratory: Positive for cough and shortness of breath.        Orthopnea  All other systems reviewed and are negative.    Allergies  Erythromycin  Home Medications   Current  Outpatient Rx  Name Route Sig Dispense Refill  . PROGESTERONE MICRONIZED PO Oral Take 25 mg by mouth 2 (two) times daily.    . THYROID 60 MG PO TABS Oral Take 60 mg by mouth daily.      BP 137/67  Pulse 113  Temp(Src) 97.9 F (36.6 C) (Oral)  Resp 24  SpO2 83%  Physical Exam  Nursing note and vitals reviewed. Constitutional: She is oriented to person, place, and time. She appears well-developed and well-nourished.  HENT:  Head: Normocephalic and atraumatic.  Eyes: Conjunctivae are normal. Pupils are equal, round, and reactive to light.  Neck: Neck supple. No tracheal deviation present. No thyromegaly present.       Positive JVD, no bruit  Cardiovascular: Normal rate and regular rhythm.   No murmur heard. Pulmonary/Chest: Effort normal. She has rales (rales at right base).       Rales right base  Abdominal: Soft. Bowel sounds are normal. She exhibits no distension. There is no tenderness.  Musculoskeletal: Normal range of motion. She exhibits no edema and no tenderness.  Neurological: She is alert and oriented to person, place, and time. Coordination normal.  Skin: Skin is warm and dry. No rash noted.  Psychiatric: She has a normal mood and affect. Her behavior is normal.    ED Course  Procedures (including critical care time) DIAGNOSTIC STUDIES: Oxygen Saturation is 83% on [place], HYPOXEMIA by my interpretation.    COORDINATION OF CARE:  Medications  thyroid (ARMOUR) 60 MG tablet (not administered)  PROGESTERONE MICRONIZED PO (not administered)     Date: 01/16/2012  Rate: 105  Rhythm: sinus tachycardia  QRS Axis: normal  Intervals: normal  ST/T Wave abnormalities: normal  Conduction Disutrbances:none  Narrative Interpretation:   Old EKG Reviewed: changes noted Tracing from 02/24/2011 showed normal sinus rhythm 80 beats per minute otherwise unchanged   Labs Reviewed - No data to display No results found.  Results for orders placed during the hospital encounter of  01/16/12  BASIC METABOLIC PANEL      Component Value Range   Sodium 135  135 - 145 (mEq/L)   Potassium 4.1  3.5 - 5.1 (mEq/L)   Chloride 97  96 - 112 (mEq/L)   CO2 25  19 - 32 (mEq/L)   Glucose, Bld 132 (*) 70 - 99 (mg/dL)   BUN 25 (*) 6 - 23 (mg/dL)   Creatinine, Ser 9.52  0.50 - 1.10 (mg/dL)   Calcium 9.5  8.4 - 84.1 (mg/dL)   GFR calc non Af Amer >90  >90 (mL/min)   GFR calc Af Amer >90  >90 (mL/min)  CBC      Component Value Range   WBC 14.8 (*) 4.0 - 10.5 (K/uL)   RBC 4.51  3.87 - 5.11 (MIL/uL)   Hemoglobin 14.4  12.0 - 15.0 (g/dL)   HCT 32.4  40.1 - 02.7 (%)   MCV 92.7  78.0 - 100.0 (fL)   MCH 31.9  26.0 - 34.0 (pg)   MCHC 34.4  30.0 - 36.0 (g/dL)   RDW 25.3  66.4 - 40.3 (%)   Platelets 196  150 - 400 (K/uL)  DIFFERENTIAL      Component Value Range   Neutrophils Relative 78 (*) 43 - 77 (%)   Neutro Abs 11.6 (*) 1.7 - 7.7 (K/uL)   Lymphocytes Relative 11 (*) 12 - 46 (%)   Lymphs Abs 1.6  0.7 - 4.0 (K/uL)   Monocytes Relative 11  3 - 12 (%)   Monocytes Absolute 1.6 (*) 0.1 - 1.0 (K/uL)   Eosinophils Relative 0  0 - 5 (%)   Eosinophils Absolute 0.0  0.0 - 0.7 (K/uL)   Basophils Relative 0  0 - 1 (%)   Basophils Absolute 0.0  0.0 - 0.1 (K/uL)  TROPONIN I      Component Value Range   Troponin I <0.30  <0.30 (ng/mL)  PRO B NATRIURETIC PEPTIDE      Component Value Range   Pro B Natriuretic peptide (BNP) 7570.0 (*) 0 - 125 (pg/mL)   Dg Chest 2 View  01/16/2012  *RADIOLOGY REPORT*  Clinical Data: Cough, congestion, fever, shortness of breath.  CHEST - 2 VIEW  Comparison: 02/24/2011  Findings: Severe COPD changes.  Patchy bilateral airspace opacities are noted, right greater than left with bilateral pleural effusions.  Heart size is upper limits normal.  Stable right apical pleural thickening.  Surgical clips in the right axilla and breast.  IMPRESSION: Severe COPD.  Bilateral airspace opacities, right greater than left with bilateral effusions.  Asymmetric edema versus  infection.  Original Report Authenticated By: Cyndie Chime, M.D.    No diagnosis found.  Spoke with Dr Maryelizabeth Kaufmann who accepts patient in transfer to [facility]. Patient resting resting comfortably 11:10 PM  MDM  Clinically patient has  congestive heart failure rather than pneumonia i.e. no cough complains of orthopnea, positive neck vein distention Plan Lasix nitrates telemetry Diagnosis#1 congestive heart failure #2 hypoxia   I personally performed the services described in this documentation, which was scribed in my presence. The recorded information has been reviewed and considered.       Doug Sou, MD 01/16/12 2317  Doug Sou, MD 01/16/12 [phone]

## 2012-01-16 NOTE — ED Notes (Signed)
EDP Jacubowitz notified of pt's O2 sats 83% on RA in triage- pt up to 90% on 2L [place]

## 2012-01-16 NOTE — ED Notes (Signed)
Pt reports sob x 1 day- O2 sats 82% in triage- cough also

## 2012-01-17 ENCOUNTER — Encounter (HOSPITAL_COMMUNITY): Payer: Self-pay | Admitting: *Deleted

## 2012-01-17 DIAGNOSIS — R5383 Other fatigue: Secondary | ICD-10-CM | POA: Diagnosis not present

## 2012-01-17 DIAGNOSIS — Z79899 Other long term (current) drug therapy: Secondary | ICD-10-CM | POA: Diagnosis not present

## 2012-01-17 DIAGNOSIS — R0902 Hypoxemia: Secondary | ICD-10-CM | POA: Diagnosis not present

## 2012-01-17 DIAGNOSIS — R262 Difficulty in walking, not elsewhere classified: Secondary | ICD-10-CM | POA: Diagnosis not present

## 2012-01-17 DIAGNOSIS — E441 Mild protein-calorie malnutrition: Secondary | ICD-10-CM | POA: Diagnosis not present

## 2012-01-17 DIAGNOSIS — J984 Other disorders of lung: Secondary | ICD-10-CM | POA: Diagnosis not present

## 2012-01-17 DIAGNOSIS — I059 Rheumatic mitral valve disease, unspecified: Secondary | ICD-10-CM | POA: Diagnosis not present

## 2012-01-17 DIAGNOSIS — R000 Tachycardia, unspecified: Secondary | ICD-10-CM | POA: Diagnosis not present

## 2012-01-17 DIAGNOSIS — E039 Hypothyroidism, unspecified: Secondary | ICD-10-CM | POA: Diagnosis present

## 2012-01-17 DIAGNOSIS — J449 Chronic obstructive pulmonary disease, unspecified: Secondary | ICD-10-CM

## 2012-01-17 DIAGNOSIS — I700 Atherosclerosis of aorta: Secondary | ICD-10-CM | POA: Diagnosis not present

## 2012-01-17 DIAGNOSIS — C50919 Malignant neoplasm of unspecified site of unspecified female breast: Secondary | ICD-10-CM | POA: Diagnosis not present

## 2012-01-17 DIAGNOSIS — E873 Alkalosis: Secondary | ICD-10-CM | POA: Diagnosis not present

## 2012-01-17 DIAGNOSIS — R279 Unspecified lack of coordination: Secondary | ICD-10-CM | POA: Diagnosis not present

## 2012-01-17 DIAGNOSIS — M6281 Muscle weakness (generalized): Secondary | ICD-10-CM | POA: Diagnosis not present

## 2012-01-17 DIAGNOSIS — R0602 Shortness of breath: Secondary | ICD-10-CM | POA: Diagnosis not present

## 2012-01-17 DIAGNOSIS — I509 Heart failure, unspecified: Secondary | ICD-10-CM | POA: Diagnosis not present

## 2012-01-17 DIAGNOSIS — F411 Generalized anxiety disorder: Secondary | ICD-10-CM | POA: Diagnosis present

## 2012-01-17 DIAGNOSIS — G35 Multiple sclerosis: Secondary | ICD-10-CM | POA: Diagnosis present

## 2012-01-17 DIAGNOSIS — N319 Neuromuscular dysfunction of bladder, unspecified: Secondary | ICD-10-CM | POA: Diagnosis present

## 2012-01-17 DIAGNOSIS — Z5189 Encounter for other specified aftercare: Secondary | ICD-10-CM | POA: Diagnosis not present

## 2012-01-17 DIAGNOSIS — N39498 Other specified urinary incontinence: Secondary | ICD-10-CM | POA: Diagnosis present

## 2012-01-17 DIAGNOSIS — Z954 Presence of other heart-valve replacement: Secondary | ICD-10-CM | POA: Diagnosis not present

## 2012-01-17 DIAGNOSIS — L719 Rosacea, unspecified: Secondary | ICD-10-CM | POA: Diagnosis present

## 2012-01-17 DIAGNOSIS — I440 Atrioventricular block, first degree: Secondary | ICD-10-CM | POA: Diagnosis not present

## 2012-01-17 DIAGNOSIS — I4891 Unspecified atrial fibrillation: Secondary | ICD-10-CM | POA: Diagnosis not present

## 2012-01-17 DIAGNOSIS — Z01810 Encounter for preprocedural cardiovascular examination: Secondary | ICD-10-CM | POA: Diagnosis not present

## 2012-01-17 DIAGNOSIS — J95811 Postprocedural pneumothorax: Secondary | ICD-10-CM | POA: Diagnosis not present

## 2012-01-17 DIAGNOSIS — I498 Other specified cardiac arrhythmias: Secondary | ICD-10-CM | POA: Diagnosis not present

## 2012-01-17 DIAGNOSIS — Z7901 Long term (current) use of anticoagulants: Secondary | ICD-10-CM | POA: Diagnosis not present

## 2012-01-17 DIAGNOSIS — M899 Disorder of bone, unspecified: Secondary | ICD-10-CM | POA: Diagnosis present

## 2012-01-17 DIAGNOSIS — Z881 Allergy status to other antibiotic agents status: Secondary | ICD-10-CM | POA: Diagnosis not present

## 2012-01-17 DIAGNOSIS — J90 Pleural effusion, not elsewhere classified: Secondary | ICD-10-CM | POA: Diagnosis not present

## 2012-01-17 DIAGNOSIS — J438 Other emphysema: Secondary | ICD-10-CM | POA: Diagnosis not present

## 2012-01-17 DIAGNOSIS — Q233 Congenital mitral insufficiency: Secondary | ICD-10-CM | POA: Diagnosis not present

## 2012-01-17 DIAGNOSIS — J9819 Other pulmonary collapse: Secondary | ICD-10-CM | POA: Diagnosis not present

## 2012-01-17 DIAGNOSIS — D62 Acute posthemorrhagic anemia: Secondary | ICD-10-CM | POA: Diagnosis not present

## 2012-01-17 DIAGNOSIS — R918 Other nonspecific abnormal finding of lung field: Secondary | ICD-10-CM | POA: Diagnosis not present

## 2012-01-17 DIAGNOSIS — Z48812 Encounter for surgical aftercare following surgery on the circulatory system: Secondary | ICD-10-CM | POA: Diagnosis not present

## 2012-01-17 DIAGNOSIS — Z853 Personal history of malignant neoplasm of breast: Secondary | ICD-10-CM | POA: Diagnosis not present

## 2012-01-17 DIAGNOSIS — Z09 Encounter for follow-up examination after completed treatment for conditions other than malignant neoplasm: Secondary | ICD-10-CM | POA: Diagnosis not present

## 2012-01-17 DIAGNOSIS — E876 Hypokalemia: Secondary | ICD-10-CM | POA: Diagnosis not present

## 2012-01-17 DIAGNOSIS — I2789 Other specified pulmonary heart diseases: Secondary | ICD-10-CM | POA: Diagnosis not present

## 2012-01-17 DIAGNOSIS — J9383 Other pneumothorax: Secondary | ICD-10-CM | POA: Diagnosis not present

## 2012-01-17 LAB — IRON AND TIBC
Iron: 37 ug/dL — ABNORMAL LOW (ref 42–135)
Saturation Ratios: 15 % — ABNORMAL LOW (ref 20–55)
TIBC: 245 ug/dL — ABNORMAL LOW (ref 250–470)
UIBC: 208 ug/dL (ref 125–400)

## 2012-01-17 LAB — T4, FREE: Free T4: 0.75 ng/dL — ABNORMAL LOW (ref 0.80–1.80)

## 2012-01-17 LAB — CBC
HCT: 39.3 % (ref 36.0–46.0)
Hemoglobin: 13.5 g/dL (ref 12.0–15.0)
RBC: 4.17 MIL/uL (ref 3.87–5.11)

## 2012-01-17 LAB — BASIC METABOLIC PANEL
CO2: 24 mEq/L (ref 19–32)
Calcium: 9.2 mg/dL (ref 8.4–10.5)
Chloride: 99 mEq/L (ref 96–112)
Creatinine, Ser: 0.63 mg/dL (ref 0.50–1.10)
Glucose, Bld: 138 mg/dL — ABNORMAL HIGH (ref 70–99)
Sodium: 136 mEq/L (ref 135–145)

## 2012-01-17 LAB — TSH: TSH: 4.38 u[IU]/mL (ref 0.350–4.500)

## 2012-01-17 LAB — MAGNESIUM: Magnesium: 1.8 mg/dL (ref 1.5–2.5)

## 2012-01-17 LAB — FERRITIN: Ferritin: 225 ng/mL (ref 10–291)

## 2012-01-17 MED ORDER — FUROSEMIDE 10 MG/ML IJ SOLN
40.0000 mg | Freq: Two times a day (BID) | INTRAMUSCULAR | Status: DC
  Administered 2012-01-17 – 2012-01-18 (×2): 40 mg via INTRAVENOUS
  Filled 2012-01-17 (×4): qty 4

## 2012-01-17 MED ORDER — ONDANSETRON HCL 4 MG/2ML IJ SOLN: 4.0000 mg | Freq: Four times a day (QID) | INTRAMUSCULAR | Status: DC | PRN

## 2012-01-17 MED ORDER — LISINOPRIL 2.5 MG PO TABS
2.5000 mg | ORAL_TABLET | Freq: Every day | ORAL | Status: DC
  Administered 2012-01-17 – 2012-01-21 (×4): 2.5 mg via ORAL
  Filled 2012-01-17 (×6): qty 1

## 2012-01-17 MED ORDER — SODIUM CHLORIDE 0.9 % IJ SOLN: 3.0000 mL | INTRAMUSCULAR | Status: DC | PRN

## 2012-01-17 MED ORDER — POTASSIUM CHLORIDE CRYS ER 10 MEQ PO TBCR
10.0000 meq | EXTENDED_RELEASE_TABLET | Freq: Every day | ORAL | Status: DC
  Administered 2012-01-17 – 2012-01-21 (×5): 10 meq via ORAL
  Filled 2012-01-17 (×6): qty 1

## 2012-01-17 MED ORDER — FUROSEMIDE 10 MG/ML IJ SOLN
20.0000 mg | Freq: Two times a day (BID) | INTRAMUSCULAR | Status: DC
Start: 2012-01-17 — End: 2012-01-17
  Administered 2012-01-17: 20 mg via INTRAVENOUS
  Filled 2012-01-17 (×3): qty 2

## 2012-01-17 MED ORDER — THYROID 60 MG PO TABS
60.0000 mg | ORAL_TABLET | Freq: Every day | ORAL | Status: DC
  Administered 2012-01-17 – 2012-02-03 (×16): 60 mg via ORAL
  Filled 2012-01-17 (×19): qty 1

## 2012-01-17 MED ORDER — HEPARIN SODIUM (PORCINE) 5000 UNIT/ML IJ SOLN
5000.0000 [IU] | Freq: Three times a day (TID) | INTRAMUSCULAR | Status: DC
  Administered 2012-01-17 – 2012-01-20 (×9): 5000 [IU] via SUBCUTANEOUS
  Filled 2012-01-17 (×13): qty 1

## 2012-01-17 MED ORDER — SODIUM CHLORIDE 0.9 % IJ SOLN
3.0000 mL | Freq: Two times a day (BID) | INTRAMUSCULAR | Status: DC
  Administered 2012-01-18 – 2012-01-19 (×3): 3 mL via INTRAVENOUS

## 2012-01-17 MED ORDER — SODIUM CHLORIDE 0.9 % IV SOLN: 250.0000 mL | INTRAVENOUS | Status: DC | PRN

## 2012-01-17 MED ORDER — ACETAMINOPHEN 325 MG PO TABS: 650.0000 mg | ORAL_TABLET | ORAL | Status: DC | PRN

## 2012-01-17 NOTE — H&P (Signed)
Primary Cardiologist: unassigned [name] [name])   Patient Location: Room 04/14/36  Chief Complaint: shortness of breath  HPI:  [name] [name] is a pleasant 61 year old woman with a history of remotely treated breast cancer, hypothyroidism, and a reported history of incidentally noted mitral regurgitation years ago who presented to [facility] this evening with shortness of breath.  She reports last feeling physically well approximately ten days ago. She usually is able to walk up to half a mile or ascend stairs without any limiting chest symptoms or shortness of breath. For the past several days she has developed a cough and has developed progressively worsening orthopnea to the point where she has slept with the head of the bed at a 45 degree angle for the past two nights at home. She has begun to tire more easily and feel drained of energy. She has felt mildly nauseated over the past few days. No palpitations, chest pain or tightness, lightheadedness, or syncope. No recent fevers or illnesses. No clear sick contacts. No changes in her bowel or bladder habits.   No recent changes in thyroid replacement medication.   She has been under considerable stress recently stemming from the death of her mother in 01-14-2023 of this year. She says she is still very much in the grieving process and indicates that she has been pretty demoralized of late.   On the basis of her symptoms and her exam, she was transferred to [facility] for further evaluation and management.  Past Medical History:  Mitral Regurgitation - reportedly noted on TTE when she was undergoing cancer treatments - She has never been on medication for this Breast Cancer (1999-2000) - s/p Right lumpectomy & lymphadenectomy  Multiple Sclerosis Hypothyroidism Ovarian Cyst Right Upper Extremity Type I open fracture requiring surgical repair, 04/15/11)  Family History  No familial cardiomyopathies or sudden cardiac death  Social  History  Lifelong non-smoker No EtOH or illicits Retired; previously worked in the [employer], dispensing in [place], lived in [place], then moved to [place] alone with her mother's cat Mother, to whom patient was very close, passed away in March 28, 2013Husband died of cancer in 1999-04-15  ROS: as per HPI; otherwise is comprehensively negative  Allergies: Erythromycin  Medications (home):  Thyroid Armour 60mg   Progesterone 25mg   Exam  Weight 141 lbs (she says her baseline has been around 135 lbs for past several months) Afebrile HR 116 BP 119/76 RR 16 95% on RA  GEN no acute distress; thin appearing woman sitting with head of the bed at 45 degrees NECK supple, JVP is elevated at 12-14 cm H2O  CHEST clear to auscultation bilaterally with diminished basilar sounds CARDIAC regular, S1 S2, holosystolic murmur heard best at apex ABDOMEN soft, non-distended, non-tender, liver non-pulsatile EXT warm, no edema present  NEURO alert & oriented  SKIN warm & dry   Labs  ProBNP 7500 Trop <0.30 Na/K 135/4.1  BUN/Cr 25/0.6 WBC  14.8 Hgb 14.4 Plts 196K Urinalysis: pending  Imaging & Testing  CXR: bilateral airspace opacities consistent with vascular congestion ECG:  Sinus tachycardia (106), normal intervals, no Q waves  Assessment/Plan  This is a 61 year old woman with a clinical history and exam suggestive of acute decompensated heart failure in a patient with no prior history of congestive heart failure. Her Pro-BNP is significantly elevated at 7500. She has had a fairly steep clinical decline in the past week or so, and is impressively symptomatic on admission. The potential etiologies of  her presumptive CHF are many, and include: worsening mitral regurgitation, myocarditis,  any number of infiltrative cardiomyopathies, constrictive pericarditis given her receipt of XRT in the remote past, under-replacement or over-replacement of thyroid hormone, stress induced cardiomyopathy stemming from  her mother's death and the ongoing grief, etc. Given her pronounced recent decline together with normal cardiac biomarkers on presentation, I think it unlikely that this is an ischemic cardiomyopathy.   She is warm and wet on exam, without clinical or laboratory signs of compromised end-organ perfusion. We will nevertheless proceed cautiously with modifying her medication regimen until she is a bit more compensated from a volume standpoint.  1) ADHF: Acute Decompensated Heart Failure, warm & wet - IV Furosemide at 20mg  IV q12h with modifications as necessary to achieve adequate diuresis. She is 5-6 pounds over her supposed baseline weight. - Vigilance with regards to electrolytes and renal function; Will pro-actively initiate KCL at 20 mEq PO Daily  - Will initiate low-dose afterload reduction in the form of Lisinopril 2.5mg  Daily with adjustments as tolerated by BP and hemodynamics - Will hold on initiating a beta-blocker until she is in a more compensated state - Check TSH and Free T4 to formally assess adequacy of thyroid hormone replacement - Check Fe studies (Ferritin and TIBC) - Check TTE to assess for structural heart disease and to assess bi-ventricular function - Once we have a sense of her structural heart, her overall LV function, and her response to the above therapies, we can then assess her need and the timing for more invasive testing such as LHCath & RHCath.  I discussed my impressions with the patient. Her questions were answered to the best of my ability.   Zacarias Pontes, MD  Cardiology Fellow On-Call  [phone]

## 2012-01-17 NOTE — Progress Notes (Signed)
  Echocardiogram 2D Echocardiogram has been performed.  [name] [name] 01/17/2012, 3:23 PM

## 2012-01-17 NOTE — Progress Notes (Signed)
Patient ID: [name] [name], female   DOB: August 31, 1951, 61 y.o.   MRN: [id number]   SUBJECTIVE: Patient is feeling somewhat better since admission. I cannot get document how much diuresis she has had.   Filed Vitals:   01/16/12 2350 01/17/12 0126 01/17/12 0539 01/17/12 0640  BP: 128/82 119/76 124/72 124/72  Pulse: 114 116 116 116  Temp: 98.1 F (36.7 C) 98.1 F (36.7 C) 99 F (37.2 C) 99 F (37.2 C)  TempSrc: Oral Oral Oral Oral  Resp:  23 20 20   Height:    5\' 9"  (1.753 m)  Weight:  141 lb 1.6 oz (64.003 kg)  141 lb 1.6 oz (64.003 kg)  SpO2: 95% 90% 90% 90%    Intake/Output Summary (Last 24 hours) at 01/17/12 0955 Last data filed at 01/17/12 0600  Gross per 24 hour  Intake    120 ml  Output      0 ml  Net    120 ml    LABS: Basic Metabolic Panel:  Basename 01/17/12 0228 01/16/12 2004  NA 136 135  K 3.6 4.1  CL 99 97  CO2 24 25  GLUCOSE 138* 132*  BUN 23 25*  CREATININE 0.63 0.60  CALCIUM 9.2 9.5  MG 1.8 --  PHOS -- --   Liver Function Tests: No results found for this basename: AST:2,ALT:2,ALKPHOS:2,BILITOT:2,PROT:2,ALBUMIN:2 in the last 72 hours No results found for this basename: LIPASE:2,AMYLASE:2 in the last 72 hours CBC:  Basename 01/17/12 0228 01/16/12 2004  WBC 13.9* 14.8*  NEUTROABS -- 11.6*  HGB 13.5 14.4  HCT 39.3 41.8  MCV 94.2 92.7  PLT 195 196   Cardiac Enzymes:  Basename 01/16/12 2004  CKTOTAL --  CKMB --  CKMBINDEX --  TROPONINI <0.30   BNP: No components found with this basename: POCBNP:3 D-Dimer: No results found for this basename: DDIMER:2 in the last 72 hours Hemoglobin A1C: No results found for this basename: HGBA1C in the last 72 hours Fasting Lipid Panel: No results found for this basename: CHOL,HDL,LDLCALC,TRIG,CHOLHDL,LDLDIRECT in the last 72 hours Thyroid Function Tests: No results found for this basename: TSH,T4TOTAL,FREET3,T3FREE,THYROIDAB in the last 72 hours  RADIOLOGY: Dg Chest 2 View  01/16/2012  *RADIOLOGY  REPORT*  Clinical Data: Cough, congestion, fever, shortness of breath.  CHEST - 2 VIEW  Comparison: 02/24/2011  Findings: Severe COPD changes.  Patchy bilateral airspace opacities are noted, right greater than left with bilateral pleural effusions.  Heart size is upper limits normal.  Stable right apical pleural thickening.  Surgical clips in the right axilla and breast.  IMPRESSION: Severe COPD.  Bilateral airspace opacities, right greater than left with bilateral effusions.  Asymmetric edema versus infection.  Original Report Authenticated By: Cyndie Chime, M.D.    PHYSICAL EXAM   Patient is oriented to person time and place. Affect is normal. Head is atraumatic. Evaluation the lungs reveal scattered rhonchi and some rales. Cardiac exam reveals S1 and S2. There is a systolic murmur. The abdomen is soft. There is 1+ peripheral edema.   TELEMETRY: I have reviewed telemetry. There is sinus rhythm.  ASSESSMENT AND PLAN:   HYPOTHYROIDISM    Labs are being checked.   Multiple sclerosis   MITRAL REGURGITATION    The patient gives a history of mitral regurgitation in the past. She had chemotherapy at the time of her breast cancer in the past. She does not mention LV dysfunction from then. Echo is to be done.   NEUROGENIC BLADDER  URINARY INCONTINENCE  BREAST  CANCER, HX OF   COPD (chronic obstructive pulmonary disease)   Chest x-ray reveals severe COPD. In addition there is evidence of fluid overload.   CHF (congestive heart failure)   There is a new diagnosis of CHF. We do not know yet if this is systolic or diastolic. Echo is to be done. Dose of Lasix is increased and will follow her renal function carefully.  Willa Rough 01/17/2012 9:55 AM

## 2012-01-18 DIAGNOSIS — R0902 Hypoxemia: Secondary | ICD-10-CM | POA: Diagnosis present

## 2012-01-18 DIAGNOSIS — I059 Rheumatic mitral valve disease, unspecified: Secondary | ICD-10-CM

## 2012-01-18 DIAGNOSIS — R000 Tachycardia, unspecified: Secondary | ICD-10-CM | POA: Diagnosis present

## 2012-01-18 DIAGNOSIS — I2789 Other specified pulmonary heart diseases: Secondary | ICD-10-CM

## 2012-01-18 DIAGNOSIS — I509 Heart failure, unspecified: Principal | ICD-10-CM

## 2012-01-18 DIAGNOSIS — I272 Pulmonary hypertension, unspecified: Secondary | ICD-10-CM | POA: Diagnosis present

## 2012-01-18 DIAGNOSIS — F419 Anxiety disorder, unspecified: Secondary | ICD-10-CM | POA: Diagnosis present

## 2012-01-18 DIAGNOSIS — J449 Chronic obstructive pulmonary disease, unspecified: Secondary | ICD-10-CM

## 2012-01-18 LAB — BASIC METABOLIC PANEL
BUN: 24 mg/dL — ABNORMAL HIGH (ref 6–23)
Calcium: 9 mg/dL (ref 8.4–10.5)
Creatinine, Ser: 0.62 mg/dL (ref 0.50–1.10)
GFR calc Af Amer: >90 mL/min (ref >90)
GFR calc non Af Amer: >90 mL/min (ref >90)

## 2012-01-18 LAB — SEDIMENTATION RATE: Sed Rate: 52 mm/hr — ABNORMAL HIGH (ref 0–22)

## 2012-01-18 LAB — RHEUMATOID FACTOR: Rhuematoid fact SerPl-aCnc: 12 IU/mL (ref <=14)

## 2012-01-18 MED ORDER — SODIUM CHLORIDE 0.45 % IV SOLN
INTRAVENOUS | Status: DC
  Administered 2012-01-19: 07:00:00 via INTRAVENOUS

## 2012-01-18 MED ORDER — SODIUM CHLORIDE 0.9 % IJ SOLN: 3.0000 mL | INTRAMUSCULAR | Status: DC | PRN

## 2012-01-18 MED ORDER — FENTANYL CITRATE 0.05 MG/ML IJ SOLN: 250.0000 ug | Freq: Once | INTRAMUSCULAR | Status: DC

## 2012-01-18 MED ORDER — FUROSEMIDE 40 MG PO TABS
40.0000 mg | ORAL_TABLET | Freq: Once | ORAL | Status: AC
  Filled 2012-01-18: qty 1

## 2012-01-18 MED ORDER — SODIUM CHLORIDE 0.9 % IJ SOLN
3.0000 mL | Freq: Two times a day (BID) | INTRAMUSCULAR | Status: DC
  Administered 2012-01-18 – 2012-01-19 (×3): 3 mL via INTRAVENOUS

## 2012-01-18 MED ORDER — BENZOCAINE 20 % MT SOLN
1.0000 "application " | OROMUCOSAL | Status: DC | PRN
  Filled 2012-01-18: qty 57

## 2012-01-18 MED ORDER — SODIUM CHLORIDE 0.9 % IV SOLN: 250.0000 mL | INTRAVENOUS | Status: DC | PRN

## 2012-01-18 MED ORDER — MIDAZOLAM HCL 10 MG/2ML IJ SOLN: 10.0000 mg | Freq: Once | INTRAMUSCULAR | Status: DC

## 2012-01-18 NOTE — Progress Notes (Signed)
Patient ID: [name] [name], female   DOB: [birth year], 61 y.o.   MRN: [id number]   SUBJECTIVE:  The patient feels mildly improved. She still has some shortness of breath. She's had mild to diuresis. Her renal function has remained stable.  I personally reviewed her two-dimensional echo. The patient has excellent LV function. This is important to note that she had had chemotherapy in the past for breast cancer. She has severe mitral regurgitation. 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   Filed Vitals:   01/17/12 0640 01/17/12 1500 01/17/12 2106 01/18/12 0409  BP: 124/72 96/62 97/75  105/72  Pulse: 116 106 108 102  Temp: 99 F (37.2 C) 98.5 F (36.9 C) 98.5 F (36.9 C) 98.6 F (37 C)  TempSrc: Oral Oral Oral Oral  Resp: 20 16 17 17   Height: 5\' 9"  (1.753 m)     Weight: 141 lb 1.6 oz (64.003 kg)   137 lb 9.6 oz (62.415 kg)  SpO2: 90% 93% 92% 92%    Intake/Output Summary (Last 24 hours) at 01/18/12 1008 Last data filed at 01/18/12 0900  Gross per 24 hour  Intake    830 ml  Output    600 ml  Net    230 ml    LABS: Basic Metabolic Panel:  Basename 01/18/12 0615 01/17/12 0228  NA 139 136  K 3.7 3.6  CL 102 99  CO2 28 24  GLUCOSE 117* 138*  BUN 24* 23  CREATININE 0.62 0.63  CALCIUM 9.0 9.2  MG -- 1.8  PHOS -- --   Liver Function Tests: No results found for this basename: AST:2,ALT:2,ALKPHOS:2,BILITOT:2,PROT:2,ALBUMIN:2 in the last 72 hours No results found for this basename: LIPASE:2,AMYLASE:2 in the last 72 hours CBC:  Basename 01/17/12 0228 01/16/12 2004  WBC 13.9* 14.8*  NEUTROABS -- 11.6*  HGB 13.5 14.4  HCT 39.3 41.8  MCV 94.2 92.7  PLT 195 196   Cardiac Enzymes:  Basename 01/16/12 2004  CKTOTAL --  CKMB --  CKMBINDEX --  TROPONINI <0.30   BNP: No components found with this basename: POCBNP:3 D-Dimer: No results found for this basename: DDIMER:2 in the last 72 hours Hemoglobin A1C: No results found for this basename: HGBA1C in the last 72  hours Fasting Lipid Panel: No results found for this basename: CHOL,HDL,LDLCALC,TRIG,CHOLHDL,LDLDIRECT in the last 72 hours Thyroid Function Tests:  Basename 01/17/12 0228  TSH 4.380  T4TOTAL --  T3FREE --  THYROIDAB --    RADIOLOGY: Dg Chest 2 View  01/16/2012  *RADIOLOGY REPORT*  Clinical Data: Cough, congestion, fever, shortness of breath.  CHEST - 2 VIEW  Comparison: 02/24/2011  Findings: Severe COPD changes.  Patchy bilateral airspace opacities are noted, right greater than left with bilateral pleural effusions.  Heart size is upper limits normal.  Stable right apical pleural thickening.  Surgical clips in the right axilla and breast.  IMPRESSION: Severe COPD.  Bilateral airspace opacities, right greater than left with bilateral effusions.  Asymmetric edema versus infection.  Original Report Authenticated By: Cyndie Chime, M.D.    PHYSICAL EXAM  Patient is oriented to person time and place. Affect is normal. She is anxious about her diagnoses. I have explained them to her at great length. Head is atraumatic. Lungs reveal scattered rhonchi. I do not hear definite rales today. There is no respiratory distress. Cardiac exam reveals S1 and S2. There is a loud murmur of mitral regurgitation. The abdomen is soft. She has no significant peripheral edema. There no musculoskeletal  deformities. There are no skin rashes.   TELEMETRY: I personally reviewed telemetry. She has mild sinus tachycardia. There are multiple PACs.   ASSESSMENT AND PLAN:   HYPOTHYROIDISM   TSH is normal. She is on her home dose of thyroid.   Multiple sclerosis    At this point her multiple sclerosis does not appear to be a major limiting factor.   MITRAL REGURGITATION    Mitral regurgitation is severe. I have had a prolonged discussion with the patient about this. Left ventricular function is normal with an ejection fraction of 60%. 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 She does have pulmonary  hypertension. I cannot be sure at this time as this is related to her valve or lung disease. We will obtain more data to heart catheterization and pulmonary consultation. The patient needs a transesophageal echo and she will need right and left heart cath both for hemodynamics and 4 anatomy of her coronaries. I will also plan to involve cardiothoracic surgery for their opinion.   NEUROGENIC BLADDER  URINARY INCONTINENCE   BREAST CANCER, HX OF    The patient received chemotherapy in the past. This does not appear to have affected her LV function.   COPD (chronic obstructive pulmonary disease)    Chest x-ray is read as severe COPD. There is pulmonary hypertension. I will last for formal pulmonary consultation to help as part of the overall evaluation.   CHF (congestive heart failure)   The patient's initial chest x-ray suggested some volume overload. She has diaries mildly. I am hesitant to push her diuretics more aggressively at this point.   Anxiety   The patient notes repeatedly that she is anxious about feeling things at the time of any studies. She hopes that we can sedate her as much as possible.   Willa Rough 01/18/2012 10:08 AM

## 2012-01-18 NOTE — Progress Notes (Signed)
01/18/12 1415 UR Completed. Tera Mater, RN, BSN Nurse Case Manager [phone]

## 2012-01-18 NOTE — Consult Note (Signed)
Patient: [name] [name] DOB: 03-Dec-1950 Date of Admission: 01/16/2012            Pulmonary consult  Date of Consult: 01/18/2012 MD requesting consult:  Myrtis Ser Reason for consult: pulm htn, surgical clearance  HPI - 61yo female with hx MS and breast ca who presented 331 with 10 day hx progressive SOB and orthopnea.  Admitted by cardiology with decompensated heart failure and 2D echo revealed severe mitral valve regurgitation and pulm HTN.  PCCM consulted for pulm htn and surgical clearance for potential MVR.   Allergies:  Allergies  Allergen Reactions  . Erythromycin Nausea And Vomiting     PMH: Past Medical History  Diagnosis Date  . Multiple sclerosis   . Breast cancer   . Ovarian cyst   . Hypothyroidism     Home meds: Thyroid Armour 60mg   Progesterone 25mg     Social Hx: History   Social History  . Marital Status: Widowed    Spouse Name: N/A    Number of Children: N/A  . Years of Education: N/A   Occupational History  . Not on file.   Social History Main Topics  . Smoking status: Never Smoker   . Smokeless tobacco: Never Used  . Alcohol Use: No  . Drug Use: No  . Sexually Active:    Other Topics Concern  . Not on file   Social History Narrative  . No narrative on file     Family Hx: History reviewed. No pertinent family history.   ROS: C/o very mild SOB.  At baseline has no activity limitation r/t SOB.  Never smoker but did have significant second hand exposure both by her parents and her husband.  C/o intermittent loose cough with clear sputum.  Denies chest pain, hemoptysis. All other systems reviewed and were neg.   Filed Vitals:   01/17/12 0640 01/17/12 1500 01/17/12 2106 01/18/12 0409  BP: 124/72 96/62 97/75  105/72  Pulse: 116 106 108 102  Temp: 99 F (37.2 C) 98.5 F (36.9 C) 98.5 F (36.9 C) 98.6 F (37 C)  TempSrc: Oral Oral Oral Oral  Resp: 20 16 17 17   Height: 5\' 9"  (1.753 m)     Weight: 141 lb 1.6 oz (64.003 kg)   137 lb 9.6 oz  (62.415 kg)  SpO2: 90% 93% 92% 92%    chest X-ray Dg Chest 2 View  01/16/2012  *RADIOLOGY REPORT*  Clinical Data: Cough, congestion, fever, shortness of breath.  CHEST - 2 VIEW  Comparison: 02/24/2011  Findings: Severe COPD changes.  Patchy bilateral airspace opacities are noted, right greater than left with bilateral pleural effusions.  Heart size is upper limits normal.  Stable right apical pleural thickening.  Surgical clips in the right axilla and breast.  IMPRESSION: Severe COPD.  Bilateral airspace opacities, right greater than left with bilateral effusions.  Asymmetric edema versus infection.  Original Report Authenticated By: Cyndie Chime, M.D.     CBC    Component Value Date/Time   WBC 13.9* 01/17/2012 0228   WBC 5.2 02/19/2011 1518   RBC 4.17 01/17/2012 0228   RBC 4.25 02/19/2011 1518   HGB 13.5 01/17/2012 0228   HGB 14.1 02/19/2011 1518   HCT 39.3 01/17/2012 0228   HCT 40.3 02/19/2011 1518   PLT 195 01/17/2012 0228   PLT 216 02/19/2011 1518   MCV 94.2 01/17/2012 0228   MCV 94.8 02/19/2011 1518   MCH 32.4 01/17/2012 0228   MCH 33.2 02/19/2011 1518   MCHC 34.4  01/17/2012 0228   MCHC 35.0 02/19/2011 1518   RDW 14.0 01/17/2012 0228   RDW 12.5 02/19/2011 1518   LYMPHSABS 1.6 01/16/2012 2004   LYMPHSABS 1.3 02/19/2011 1518   MONOABS 1.6* 01/16/2012 2004   MONOABS 0.6 02/19/2011 1518   EOSABS 0.0 01/16/2012 2004   EOSABS 0.1 02/19/2011 1518   BASOSABS 0.0 01/16/2012 2004   BASOSABS 0.0 02/19/2011 1518     BMET    Component Value Date/Time   NA 139 01/18/2012 0615   K 3.7 01/18/2012 0615   CL 102 01/18/2012 0615   CO2 28 01/18/2012 0615   GLUCOSE 117* 01/18/2012 0615   BUN 24* 01/18/2012 0615   CREATININE 0.62 01/18/2012 0615   CALCIUM 9.0 01/18/2012 0615   GFRNONAA >90 01/18/2012 0615   GFRAA >90 01/18/2012 0615       EXAM: General: thin female, NAD Neuro: awake and alert, MAE, appropriate - does have MS but only manifestation appears to be incontinent bladder CV:  s1s2 rrr, +murmur PULM: resps even non  labored on [place], few bibasilar crackles, no wheeze  GI: abd soft Extremities:  Thin, pale, warm and dry    IMPRESSION/ PLAN:  Dyspnea -- multifactorial in setting CHF, severe mitral regurg, pulm HTN and potentially underlying COPD. No bronchospasm.  Much improved with diuresis.  PLAN -  For TEE and cath tomorrow  ??PFT- may want to wait until d/c and do as oupt rather than during acute illness for more accurate results  Will send RF, ESR, CRP, ANA    Pulm HTN - PA pressure=68.  In setting CHF and severe mitral regurg with probable flail leaflet PLAN -  F/u cath results May improve No need sildenafil at this time   Mitral regurg - per cards CHF - per cards   [facility], NP 01/18/2012  11:41 AM Pager: [phone]  *Care during the described time interval was provided by me and/or other providers on the critical care team. I have reviewed this patient's available data, including medical history, events of note, physical examination and test results as part of my evaluation.  Pul HTN likely secondary to CHF and valve.  No indication for pul HTN treatment.  Autoimmune work up sent and will need PFT as outpatient once cardiac situation is addressed.  Patient seen and examined, agree with above note.  I dictated the care and orders written for this patient under my direction.  Koren Bound, M.D. [phone]

## 2012-01-19 ENCOUNTER — Encounter (HOSPITAL_COMMUNITY): Payer: Self-pay | Admitting: *Deleted

## 2012-01-19 ENCOUNTER — Encounter (HOSPITAL_COMMUNITY)
Admission: EM | Disposition: A | Discharge status: Skilled Nursing Facility | Payer: Self-pay | Source: Home / Self Care | Attending: Cardiology

## 2012-01-19 DIAGNOSIS — I059 Rheumatic mitral valve disease, unspecified: Secondary | ICD-10-CM

## 2012-01-19 DIAGNOSIS — E876 Hypokalemia: Secondary | ICD-10-CM

## 2012-01-19 DIAGNOSIS — J438 Other emphysema: Secondary | ICD-10-CM

## 2012-01-19 HISTORY — PX: TEE WITHOUT CARDIOVERSION: SHX5443

## 2012-01-19 LAB — BASIC METABOLIC PANEL
Calcium: 9 mg/dL (ref 8.4–10.5)
GFR calc Af Amer: >90 mL/min (ref >90)
GFR calc non Af Amer: >90 mL/min (ref >90)
Glucose, Bld: 121 mg/dL — ABNORMAL HIGH (ref 70–99)
Potassium: 3.4 mEq/L — ABNORMAL LOW (ref 3.5–5.1)
Sodium: 139 mEq/L (ref 135–145)

## 2012-01-19 SURGERY — ECHOCARDIOGRAM, TRANSESOPHAGEAL
Anesthesia: Moderate Sedation

## 2012-01-19 MED ORDER — DIAZEPAM 5 MG PO TABS
5.0000 mg | ORAL_TABLET | ORAL | Status: AC
  Administered 2012-01-20: 5 mg via ORAL
  Filled 2012-01-19: qty 1

## 2012-01-19 MED ORDER — BUTAMBEN-TETRACAINE-BENZOCAINE 2-2-14 % EX AERO
INHALATION_SPRAY | CUTANEOUS | Status: DC | PRN
  Administered 2012-01-19: 2 via TOPICAL

## 2012-01-19 MED ORDER — SODIUM CHLORIDE 0.9 % IJ SOLN
3.0000 mL | Freq: Two times a day (BID) | INTRAMUSCULAR | Status: DC
  Administered 2012-01-19: 3 mL via INTRAVENOUS

## 2012-01-19 MED ORDER — SODIUM CHLORIDE 0.9 % IJ SOLN: 3.0000 mL | INTRAMUSCULAR | Status: DC | PRN

## 2012-01-19 MED ORDER — FENTANYL CITRATE 0.05 MG/ML IJ SOLN: 250.0000 ug | Freq: Once | INTRAMUSCULAR | Status: DC

## 2012-01-19 MED ORDER — BENZOCAINE 20 % MT SOLN: 1.0000 "application " | OROMUCOSAL | Status: DC | PRN

## 2012-01-19 MED ORDER — FENTANYL CITRATE 0.05 MG/ML IJ SOLN
INTRAMUSCULAR | Status: DC | PRN
  Administered 2012-01-19 (×2): 25 ug via INTRAVENOUS

## 2012-01-19 MED ORDER — ASPIRIN 81 MG PO CHEW
324.0000 mg | CHEWABLE_TABLET | ORAL | Status: AC
  Administered 2012-01-20: 324 mg via ORAL
  Filled 2012-01-19: qty 4

## 2012-01-19 MED ORDER — POTASSIUM CHLORIDE CRYS ER 20 MEQ PO TBCR
40.0000 meq | EXTENDED_RELEASE_TABLET | Freq: Once | ORAL | Status: AC
  Administered 2012-01-19: 40 meq via ORAL
  Filled 2012-01-19: qty 2

## 2012-01-19 MED ORDER — SODIUM CHLORIDE 0.45 % IV SOLN
INTRAVENOUS | Status: DC
  Administered 2012-01-19: 20 mL via INTRAVENOUS

## 2012-01-19 MED ORDER — SODIUM CHLORIDE 0.9 % IV SOLN: 250.0000 mL | INTRAVENOUS | Status: DC | PRN

## 2012-01-19 MED ORDER — MIDAZOLAM HCL 10 MG/2ML IJ SOLN
INTRAMUSCULAR | Status: DC | PRN
  Administered 2012-01-19 (×2): 2 mg via INTRAVENOUS

## 2012-01-19 MED ORDER — ASPIRIN 81 MG PO CHEW: 324.0000 mg | CHEWABLE_TABLET | ORAL | Status: DC

## 2012-01-19 MED ORDER — SODIUM CHLORIDE 0.9 % IV SOLN: INTRAVENOUS | Status: DC

## 2012-01-19 MED ORDER — FENTANYL CITRATE 0.05 MG/ML IJ SOLN
INTRAMUSCULAR | Status: AC
  Filled 2012-01-19: qty 2

## 2012-01-19 MED ORDER — MIDAZOLAM HCL 10 MG/2ML IJ SOLN: 10.0000 mg | Freq: Once | INTRAMUSCULAR | Status: DC

## 2012-01-19 MED ORDER — MIDAZOLAM HCL 10 MG/2ML IJ SOLN
INTRAMUSCULAR | Status: AC
  Filled 2012-01-19: qty 2

## 2012-01-19 MED ORDER — DIAZEPAM 5 MG PO TABS: 5.0000 mg | ORAL_TABLET | ORAL | Status: DC

## 2012-01-19 MED ORDER — SODIUM CHLORIDE 0.9 % IJ SOLN: 3.0000 mL | Freq: Two times a day (BID) | INTRAMUSCULAR | Status: DC

## 2012-01-19 MED ORDER — SODIUM CHLORIDE 0.9 % IV SOLN
1.0000 mL/kg/h | INTRAVENOUS | Status: DC
  Administered 2012-01-20: 1 mL/kg/h via INTRAVENOUS

## 2012-01-19 NOTE — Interval H&P Note (Signed)
History and Physical Interval Note:  01/19/2012 2:20 PM  [name] [name]  has presented today for surgery, with the diagnosis of assess mitral valve  The various methods of treatment have been discussed with the patient and family. After consideration of risks, benefits and other options for treatment, the patient has consented to  Procedure(s) (LRB): TRANSESOPHAGEAL ECHOCARDIOGRAM (TEE) (N/A) as a surgical intervention .  The patients' history has been reviewed, patient examined, no change in status, stable for surgery.  I have reviewed the patients' chart and labs.  Questions were answered to the patient's satisfaction.     Theron Arista [facility] 01/19/2012 2:20 PM

## 2012-01-19 NOTE — H&P (View-Only) (Signed)
Patient ID: Keyera C Staton, female   DOB: 11/21/1950, 61 y.o.   MRN: [id number]   SUBJECTIVE:  The patient is feeling a little better. I have answered her questions about proceeding with the TEE today and cardiac catheterization tomorrow. She has been seen by our pulmonary team. At this point we will follow through with the complete cardiac workup.   Filed Vitals:   01/18/12 0409 01/18/12 1410 01/18/12 2036 01/19/12 0422  BP: 105/72 94/62 96/66 115/73  Pulse: 102 113 102 96  Temp: 98.6 F (37 C) 97.4 F (36.3 C) 97.3 F (36.3 C) 97.1 F (36.2 C)  TempSrc: Oral Oral Oral Oral  Resp: 17 18 16 20  Height:      Weight: 137 lb 9.6 oz (62.415 kg)   139 lb 8.8 oz (63.3 kg)  SpO2: 92% 96% 91% 93%    Intake/Output Summary (Last 24 hours) at 01/19/12 0930 Last data filed at 01/19/12 0428  Gross per 24 hour  Intake    480 ml  Output    375 ml  Net    105 ml    LABS: Basic Metabolic Panel:  Basename 01/19/12 0600 01/18/12 0615 01/17/12 0228  NA 139 139 --  K 3.4* 3.7 --  CL 101 102 --  CO2 28 28 --  GLUCOSE 121* 117* --  BUN 30* 24* --  CREATININE 0.60 0.62 --  CALCIUM 9.0 9.0 --  MG -- -- 1.8  PHOS -- -- --   Liver Function Tests: No results found for this basename: AST:2,ALT:2,ALKPHOS:2,BILITOT:2,PROT:2,ALBUMIN:2 in the last 72 hours No results found for this basename: LIPASE:2,AMYLASE:2 in the last 72 hours CBC:  Basename 01/17/12 0228 01/16/12 2004  WBC 13.9* 14.8*  NEUTROABS -- 11.6*  HGB 13.5 14.4  HCT 39.3 41.8  MCV 94.2 92.7  PLT 195 196   Cardiac Enzymes:  Basename 01/16/12 2004  CKTOTAL --  CKMB --  CKMBINDEX --  TROPONINI <0.30   BNP: No components found with this basename: POCBNP:3 D-Dimer: No results found for this basename: DDIMER:2 in the last 72 hours Hemoglobin A1C: No results found for this basename: HGBA1C in the last 72 hours Fasting Lipid Panel: No results found for this basename: CHOL,HDL,LDLCALC,TRIG,CHOLHDL,LDLDIRECT in the last 72  hours Thyroid Function Tests:  Basename 01/17/12 0228  TSH 4.380  T4TOTAL --  T3FREE --  THYROIDAB --    RADIOLOGY: Dg Chest 2 View  01/16/2012  *RADIOLOGY REPORT*  Clinical Data: Cough, congestion, fever, shortness of breath.  CHEST - 2 VIEW  Comparison: 02/24/2011  Findings: Severe COPD changes.  Patchy bilateral airspace opacities are noted, right greater than left with bilateral pleural effusions.  Heart size is upper limits normal.  Stable right apical pleural thickening.  Surgical clips in the right axilla and breast.  IMPRESSION: Severe COPD.  Bilateral airspace opacities, right greater than left with bilateral effusions.  Asymmetric edema versus infection.  Original Report Authenticated By: KEVIN G. DOVER, M.D.    PHYSICAL EXAM   Patient denies fever, chills, headache, sweats, rash, change in vision, change in hearing, chest pain, nausea vomiting, urinary symptoms. All other systems are reviewed and are negative.   TELEMETRY: I have reviewed telemetry. There is sinus rhythm. The rate is still close to 100.   ASSESSMENT AND PLAN:   HYPOTHYROIDISM   TSH is in the normal range.   Multiple sclerosis    Stable.   MITRAL REGURGITATION    Today. This will help further assess her anatomy. I believe   she will need her valve repaired. I will await more information from her TEE.   NEUROGENIC BLADDER  URINARY INCONTINENCE   BREAST CANCER, HX OF    This was treated in the past with Treatment including  chemotherapy. She has excellent LV function.   COPD (chronic obstructive pulmonary disease)   See the consultation done by pulmonary. At this point our hope is that her overall pulmonary artery pressures can't improve with mitral valve treatment.   CHF (congestive heart failure)   The patient has been diuresis a small amount since admission. I have been hesitant to push her diuretics too hard.   Anxiety   It is important to give the patient adequate sedation with any tests that  she is quite anxious.   Tachycardia    There is mild sinus tachycardia. I think this is probably physiologic. I have not added a beta blocker.   Pulmonary hypertension   There is significant pulmonary hypertension. This will be assessed further with her right and left heart cath tomorrow.   Hypoxemia  Hypokalemia. Potassium is 3.4. We'll plan to give her potassium after she refers from her transesophageal echo.  Francess Mullen 01/19/2012 9:30 AM  

## 2012-01-19 NOTE — CV Procedure (Signed)
TEE performed under conscious sedation with 50 micrograms of IV Fentanyl and 4 mg IV Versed. Good image quality.   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 Severe MR. Normal LV function.  Full report to follow in [employer] reporting system. Peter [place] MD, [facility]

## 2012-01-19 NOTE — Progress Notes (Signed)
Patient: [name] [name] DOB: Apr 26, 1951 Date of Admission: 01/16/2012            Pulmonary consult  Date of Consult: 01/19/2012 MD requesting consult:  Myrtis Ser Reason for consult: pulm htn, surgical clearance  Subj- Sl less dyspnea.  Awaiting TEE today 4/2 and cath 4/3    Filed Vitals:   01/18/12 0409 01/18/12 1410 01/18/12 2036 01/19/12 0422  BP: 105/72 94/62 96/66  115/73  Pulse: 102 113 102 96  Temp: 98.6 F (37 C) 97.4 F (36.3 C) 97.3 F (36.3 C) 97.1 F (36.2 C)  TempSrc: Oral Oral Oral Oral  Resp: 17 18 16 20   Height:      Weight: 62.415 kg (137 lb 9.6 oz)   63.3 kg (139 lb 8.8 oz)  SpO2: 92% 96% 91% 93%    chest X-ray 3/30 Severe COPD.  Bilateral airspace opacities, right greater than left with  bilateral effusions. Asymmetric edema versus infection    CBC    Component Value Date/Time   WBC 13.9* 01/17/2012 0228   WBC 5.2 02/19/2011 1518   RBC 4.17 01/17/2012 0228   RBC 4.25 02/19/2011 1518   HGB 13.5 01/17/2012 0228   HGB 14.1 02/19/2011 1518   HCT 39.3 01/17/2012 0228   HCT 40.3 02/19/2011 1518   PLT 195 01/17/2012 0228   PLT 216 02/19/2011 1518   MCV 94.2 01/17/2012 0228   MCV 94.8 02/19/2011 1518   MCH 32.4 01/17/2012 0228   MCH 33.2 02/19/2011 1518   MCHC 34.4 01/17/2012 0228   MCHC 35.0 02/19/2011 1518   RDW 14.0 01/17/2012 0228   RDW 12.5 02/19/2011 1518   LYMPHSABS 1.6 01/16/2012 2004   LYMPHSABS 1.3 02/19/2011 1518   MONOABS 1.6* 01/16/2012 2004   MONOABS 0.6 02/19/2011 1518   EOSABS 0.0 01/16/2012 2004   EOSABS 0.1 02/19/2011 1518   BASOSABS 0.0 01/16/2012 2004   BASOSABS 0.0 02/19/2011 1518     BMET    Component Value Date/Time   NA 139 01/19/2012 0600   K 3.4* 01/19/2012 0600   CL 101 01/19/2012 0600   CO2 28 01/19/2012 0600   GLUCOSE 121* 01/19/2012 0600   BUN 30* 01/19/2012 0600   CREATININE 0.60 01/19/2012 0600   CALCIUM 9.0 01/19/2012 0600   GFRNONAA >90 01/19/2012 0600   GFRAA >90 01/19/2012 0600       EXAM: General: thin female, NAD Neuro: awake and alert, MAE,  appropriate - does have MS but only manifestation appears to be incontinent bladder CV:  s1s2 rrr, +murmur PULM: resps even non labored on [place], few bibasilar crackles, no wheeze  GI: abd soft Extremities:  Thin, pale, warm and dry    IMPRESSION/ PLAN:  Dyspnea -- multifactorial in setting CHF, severe mitral regurg, pulm HTN and potentially underlying COPD. No bronchospasm.  Much improved with diuresis.  PLAN -  For TEE 01/19/12 and cath 01/20/12 Cant do PFTS now until cardiac studies done and pulm edema is better  Pulm HTN - PA pressure=68.  In setting CHF and severe mitral regurg with probable flail leaflet PLAN -  F/u cath results May improve No need sildenafil at this time   Mitral regurg - per cards  CHF - per cards     Shan Levans, M.D. Beeper  [phone]  Cell  [phone]  If no response or cell goes to voicemail, call beeper [phone]

## 2012-01-19 NOTE — Progress Notes (Signed)
  Echocardiogram Echocardiogram Transesophageal has been performed.  Isatou Agredano, Real Cons 01/19/2012, 3:26 PM

## 2012-01-19 NOTE — Progress Notes (Addendum)
Patient ID: [name] [name], female   DOB: Jan 15, 1951, 61 y.o.   MRN: [id number]   SUBJECTIVE:  The patient is feeling a little better. I have answered her questions about proceeding with the TEE today and cardiac catheterization tomorrow. She has been seen by our pulmonary team. At this point we will follow through with the complete cardiac workup.   Filed Vitals:   01/18/12 0409 01/18/12 1410 01/18/12 2036 01/19/12 0422  BP: 105/72 94/62 96/66  115/73  Pulse: 102 113 102 96  Temp: 98.6 F (37 C) 97.4 F (36.3 C) 97.3 F (36.3 C) 97.1 F (36.2 C)  TempSrc: Oral Oral Oral Oral  Resp: 17 18 16 20   Height:      Weight: 137 lb 9.6 oz (62.415 kg)   139 lb 8.8 oz (63.3 kg)  SpO2: 92% 96% 91% 93%    Intake/Output Summary (Last 24 hours) at 01/19/12 0930 Last data filed at 01/19/12 0428  Gross per 24 hour  Intake    480 ml  Output    375 ml  Net    105 ml    LABS: Basic Metabolic Panel:  Basename 01/19/12 0600 01/18/12 0615 01/17/12 0228  NA 139 139 --  K 3.4* 3.7 --  CL 101 102 --  CO2 28 28 --  GLUCOSE 121* 117* --  BUN 30* 24* --  CREATININE 0.60 0.62 --  CALCIUM 9.0 9.0 --  MG -- -- 1.8  PHOS -- -- --   Liver Function Tests: No results found for this basename: AST:2,ALT:2,ALKPHOS:2,BILITOT:2,PROT:2,ALBUMIN:2 in the last 72 hours No results found for this basename: LIPASE:2,AMYLASE:2 in the last 72 hours CBC:  Basename 01/17/12 0228 01/16/12 2004  WBC 13.9* 14.8*  NEUTROABS -- 11.6*  HGB 13.5 14.4  HCT 39.3 41.8  MCV 94.2 92.7  PLT 195 196   Cardiac Enzymes:  Basename 01/16/12 2004  CKTOTAL --  CKMB --  CKMBINDEX --  TROPONINI <0.30   BNP: No components found with this basename: POCBNP:3 D-Dimer: No results found for this basename: DDIMER:2 in the last 72 hours Hemoglobin A1C: No results found for this basename: HGBA1C in the last 72 hours Fasting Lipid Panel: No results found for this basename: CHOL,HDL,LDLCALC,TRIG,CHOLHDL,LDLDIRECT in the last 72  hours Thyroid Function Tests:  Basename 01/17/12 0228  TSH 4.380  T4TOTAL --  T3FREE --  THYROIDAB --    RADIOLOGY: Dg Chest 2 View  01/16/2012  *RADIOLOGY REPORT*  Clinical Data: Cough, congestion, fever, shortness of breath.  CHEST - 2 VIEW  Comparison: 02/24/2011  Findings: Severe COPD changes.  Patchy bilateral airspace opacities are noted, right greater than left with bilateral pleural effusions.  Heart size is upper limits normal.  Stable right apical pleural thickening.  Surgical clips in the right axilla and breast.  IMPRESSION: Severe COPD.  Bilateral airspace opacities, right greater than left with bilateral effusions.  Asymmetric edema versus infection.  Original Report Authenticated By: Cyndie Chime, M.D.    PHYSICAL EXAM   Patient denies fever, chills, headache, sweats, rash, change in vision, change in hearing, chest pain, nausea vomiting, urinary symptoms. All other systems are reviewed and are negative.   TELEMETRY: I have reviewed telemetry. There is sinus rhythm. The rate is still close to 100.   ASSESSMENT AND PLAN:   HYPOTHYROIDISM   TSH is in the normal range.   Multiple sclerosis    Stable.   MITRAL REGURGITATION    Today. This will help further assess her anatomy. I believe  she will need her valve repaired. I will await more information from her TEE.   NEUROGENIC BLADDER  URINARY INCONTINENCE   BREAST CANCER, HX OF    This was treated in the past with Treatment including  chemotherapy. She has excellent LV function.   COPD (chronic obstructive pulmonary disease)   See the consultation done by pulmonary. At this point our hope is that her overall pulmonary artery pressures can't improve with mitral valve treatment.   CHF (congestive heart failure)   The patient has been diuresis a small amount since admission. I have been hesitant to push her diuretics too hard.   Anxiety   It is important to give the patient adequate sedation with any tests that  she is quite anxious.   Tachycardia    There is mild sinus tachycardia. I think this is probably physiologic. I have not added a beta blocker.   Pulmonary hypertension   There is significant pulmonary hypertension. This will be assessed further with her right and left heart cath tomorrow.   Hypoxemia  Hypokalemia. Potassium is 3.4. We'll plan to give her potassium after she refers from her transesophageal echo.  Willa Rough 01/19/2012 9:30 AM

## 2012-01-20 ENCOUNTER — Other Ambulatory Visit: Payer: Self-pay

## 2012-01-20 ENCOUNTER — Encounter (HOSPITAL_COMMUNITY)
Admission: EM | Disposition: A | Discharge status: Skilled Nursing Facility | Payer: Self-pay | Source: Home / Self Care | Attending: Cardiology

## 2012-01-20 ENCOUNTER — Encounter (HOSPITAL_COMMUNITY): Payer: Self-pay | Admitting: Cardiology

## 2012-01-20 ENCOUNTER — Encounter (HOSPITAL_COMMUNITY): Payer: Medicare Other

## 2012-01-20 DIAGNOSIS — Z01810 Encounter for preprocedural cardiovascular examination: Secondary | ICD-10-CM

## 2012-01-20 DIAGNOSIS — I059 Rheumatic mitral valve disease, unspecified: Secondary | ICD-10-CM

## 2012-01-20 HISTORY — PX: LEFT AND RIGHT HEART CATHETERIZATION WITH CORONARY ANGIOGRAM: SHX5449

## 2012-01-20 LAB — POCT I-STAT 3, VENOUS BLOOD GAS (G3P V)
Acid-Base Excess: 2 mmol/L (ref 0.0–2.0)
Bicarbonate: 27.3 mEq/L — ABNORMAL HIGH (ref 20.0–24.0)
pH, Ven: 7.389 — ABNORMAL HIGH (ref 7.250–7.300)

## 2012-01-20 LAB — POCT I-STAT 3, ART BLOOD GAS (G3+)
Bicarbonate: 23.3 mEq/L (ref 20.0–24.0)
O2 Saturation: 93 %
TCO2: 24 mmol/L (ref 0–100)
pO2, Arterial: 60 mmHg — ABNORMAL LOW (ref 80.0–100.0)

## 2012-01-20 LAB — BASIC METABOLIC PANEL
Calcium: 8.9 mg/dL (ref 8.4–10.5)
GFR calc non Af Amer: >90 mL/min (ref >90)
Sodium: 138 mEq/L (ref 135–145)

## 2012-01-20 LAB — PROTIME-INR
INR: 1.02 (ref 0.00–1.49)
Prothrombin Time: 13.6 seconds (ref 11.6–15.2)

## 2012-01-20 SURGERY — LEFT AND RIGHT HEART CATHETERIZATION WITH CORONARY ANGIOGRAM
Anesthesia: LOCAL

## 2012-01-20 MED ORDER — HEPARIN (PORCINE) IN NACL 2-0.9 UNIT/ML-% IJ SOLN
INTRAMUSCULAR | Status: AC
  Filled 2012-01-20: qty 2000

## 2012-01-20 MED ORDER — ADENOSINE 6 MG/2ML IV SOLN
INTRAVENOUS | Status: AC
  Filled 2012-01-20: qty 4

## 2012-01-20 MED ORDER — SODIUM CHLORIDE 0.9 % IV SOLN: INTRAVENOUS | Status: AC

## 2012-01-20 MED ORDER — MIDAZOLAM HCL 2 MG/2ML IJ SOLN
INTRAMUSCULAR | Status: AC
  Filled 2012-01-20: qty 2

## 2012-01-20 MED ORDER — FENTANYL CITRATE 0.05 MG/ML IJ SOLN
INTRAMUSCULAR | Status: AC
  Filled 2012-01-20: qty 2

## 2012-01-20 MED ORDER — ADENOSINE 6 MG/2ML IV SOLN
INTRAVENOUS | Status: AC
  Filled 2012-01-20: qty 2

## 2012-01-20 MED ORDER — AMIODARONE HCL 200 MG PO TABS
200.0000 mg | ORAL_TABLET | Freq: Two times a day (BID) | ORAL | Status: DC
  Administered 2012-01-21: 200 mg via ORAL
  Filled 2012-01-20 (×2): qty 1

## 2012-01-20 MED ORDER — LIDOCAINE HCL (PF) 1 % IJ SOLN
INTRAMUSCULAR | Status: AC
  Filled 2012-01-20: qty 30

## 2012-01-20 MED ORDER — METOPROLOL TARTRATE 1 MG/ML IV SOLN
INTRAVENOUS | Status: AC
  Filled 2012-01-20: qty 5

## 2012-01-20 NOTE — Progress Notes (Signed)
Patient ID: [name] [name], female   DOB: 1951/08/27, 61 y.o.   MRN: [id number]   The TEE showed flail leaflet and severe MR. Patient for right and left cath with coronaries today.  Myrtis Ser

## 2012-01-20 NOTE — Interval H&P Note (Signed)
History and Physical Interval Note:  01/20/2012 9:52 AM  [name] [name]  has presented today for surgery, with the diagnosis of chest pain  The various methods of treatment have been discussed with the patient and family. After consideration of risks, benefits and other options for treatment, the patient has consented to  Procedure(s) (LRB): LEFT AND RIGHT HEART CATHETERIZATION WITH CORONARY ANGIOGRAM (N/A) as a surgical intervention .  The patients' history has been reviewed, patient examined, no change in status, stable for surgery.  I have reviewed the patients' chart and labs.  Questions were answered to the patient's satisfaction.     [name]

## 2012-01-20 NOTE — Consult Note (Signed)
CARDIOTHORACIC SURGERY CONSULTATION REPORT  PCP is Henri Medal, MD, MD Referring Provider is Willa Rough, MD   Reason for consultation:  Severe mitral regurgitation  HPI:  Patient is a 61 year old widowed white female from [place] with known history of mitral regurgitation who was admitted with fairly rapid onset of symptoms of congestive heart failure over the past two weeks.  Prior to that she reports only occasional symptoms of mild exertional shortness of breath. Last week she developed fairly rapid progression of symptoms of exertional shortness of breath and orthopnea. By this past weekend she had severe resting shortness of breath and a dry cough. She initially presented to the acute care clinic at [facility] where she was noted to be in congestive heart failure. She was admitted to the hospital and symptoms have improved somewhat with diuretic therapy. Transthoracic and transesophageal echocardiograms both demonstrate mitral valve prolapse with severe (4+) mitral regurgitation. Left and right heart catheterization performed earlier today demonstrates normal coronary artery anatomy with no significant coronary artery disease.  There was normal left ventricular function with moderate pulmonary potential. Cardiothoracic surgical consultation has been requested.  Past Medical History  Diagnosis Date  . Multiple sclerosis   . Breast cancer   . Ovarian cyst   . Hypothyroidism   . Mitral valve regurgitation   . CHF (congestive heart failure)   . Shortness of breath   . Multiple sclerosis   . Neuromuscular disorder     ms  . Arthritis     knees    Past Surgical History  Procedure Date  . Breast lumpectomy   . Lymphadenectomy   . Wrist surgery   . Hernia repair   . Tonsillectomy   . Cystoscopy   . Laparoscopy   . Tee without cardioversion 01/19/2012    Procedure: TRANSESOPHAGEAL ECHOCARDIOGRAM (TEE);  Surgeon: Peter M Swaziland, MD;  Location: [facility]  ENDOSCOPY;  Service: Cardiovascular;  Laterality: N/A;    History reviewed. No pertinent family history.  Social History History  Substance Use Topics  . Smoking status: Never Smoker   . Smokeless tobacco: Never Used  . Alcohol Use: No    Prior to Admission medications   Medication Sig Start Date End Date Taking? Authorizing Provider  PROGESTERONE MICRONIZED PO Take 25 mg by mouth 2 (two) times daily.   Yes Historical Provider, MD  thyroid (ARMOUR) 60 MG tablet Take 60 mg by mouth daily.   Yes Historical Provider, MD    Current Facility-Administered Medications  Medication Dose Route Frequency Provider Last Rate Last Dose  . 0.9 %  sodium chloride infusion   Intravenous Continuous Kathleene Hazel, MD 50 mL/hr at 01/20/12 1200    . acetaminophen (TYLENOL) tablet 650 mg  650 mg Oral Q4H PRN Zacarias Pontes, MD      . adenosine [facility]) 6 MG/2ML injection           . adenosine (ADENOCARD) 6 MG/2ML injection           . aspirin chewable tablet 324 mg  324 mg Oral Pre-Cath Luis Abed, MD   324 mg at 01/20/12 1478  . diazepam (VALIUM) tablet 5 mg  5 mg Oral On Call Luis Abed, MD   5 mg at 01/20/12 0928  . fentaNYL (SUBLIMAZE) 0.05 MG/ML injection           . heparin 2-0.9 UNIT/ML-% infusion           .  lidocaine (XYLOCAINE) 1 % injection           . lisinopril (PRINIVIL,ZESTRIL) tablet 2.5 mg  2.5 mg Oral Daily Zacarias Pontes, MD   2.5 mg at 01/19/12 1000  . metoprolol (LOPRESSOR) 1 MG/ML injection           . midazolam (VERSED) 2 MG/2ML injection           . ondansetron (ZOFRAN) injection 4 mg  4 mg Intravenous Q6H PRN Zacarias Pontes, MD      . potassium chloride (K-DUR,KLOR-CON) CR tablet 10 mEq  10 mEq Oral Daily Zacarias Pontes, MD   10 mEq at 01/20/12 1524  . thyroid (ARMOUR) tablet 60 mg  60 mg Oral QAC breakfast Zacarias Pontes, MD   60 mg at 01/20/12 0608  . DISCONTD: 0.9 %  sodium chloride infusion  250 mL Intravenous PRN Luis Abed, MD      . DISCONTD: 0.9 %  sodium  chloride infusion  1 mL/kg/hr Intravenous Continuous Luis Abed, MD 63.3 mL/hr at 01/20/12 0405 1 mL/kg/hr at 01/20/12 0405  . DISCONTD: 0.9 %  sodium chloride infusion   Intravenous Continuous Luis Abed, MD      . DISCONTD: benzocaine (HURRICAINE) 20 % mouth spray 1 application  1 application Mouth/Throat PRN Luis Abed, MD      . DISCONTD: fentaNYL (SUBLIMAZE) injection 250 mcg  250 mcg Intravenous Once Luis Abed, MD      . DISCONTD: fentaNYL (SUBLIMAZE) injection 250 mcg  250 mcg Intravenous Once Peter M Swaziland, MD      . DISCONTD: heparin injection 5,000 Units  5,000 Units Subcutaneous Q8H Zacarias Pontes, MD   5,000 Units at 01/20/12 [phone]  . DISCONTD: midazolam (VERSED) injection 10 mg  10 mg Intravenous Once Luis Abed, MD      . DISCONTD: sodium chloride 0.9 % injection 3 mL  3 mL Intravenous Q12H Luis Abed, MD   3 mL at 01/19/12 2143  . DISCONTD: sodium chloride 0.9 % injection 3 mL  3 mL Intravenous PRN Luis Abed, MD        Allergies  Allergen Reactions  . Erythromycin Nausea And Vomiting    Review of Systems:  General:  normal appetite, decreased energy   Respiratory:  + dry cough, no wheezing, no hemoptysis, no pain with inspiration or cough, + shortness of breath  Cardiac:   no chest pain or tightness, + exertional SOB, + resting SOB, no PND, + orthopnea, no LE edema, no palpitations, no syncope  GI:   no difficulty swallowing, no hematochezia, no hematemesis, no melena, no constipation, no diarrhea   GU:   no dysuria, no urgency, no frequency   Musculoskeletal: + arthritis and arthralgia afflicting both knees which have a tendency to hyperextension, the patient also recently had ORIF of right wrist fracture and has been participating in OT and PT exercises to improve her somewhat limited mobility of the wrist  Vascular:  no pain suggestive of claudication   Neuro:   no symptoms suggestive of TIA's, no seizures, no headaches, no peripheral neuropathy     Endocrine:  Negative   HEENT:  no loose teeth or painful teeth, typically sees her dentist regularly although not yet this year, no recent vision changes, no history of lens problems  Psych:   Some anxiety and stress recently related to her mother's recent passing   Physical Exam:   BP 102/72  Pulse 94  Temp(Src) 98.2  F (36.8 C) (Oral)  Resp 18  Ht 5\' 9"  (1.753 m)  Wt 63.6 kg (140 lb 3.4 oz)  BMI 20.71 kg/m2  SpO2 95%  General:  Thin but otherwise well-appearing with features suggestive of Marfan's  HEENT:  Unremarkable   Neck:   no JVD, no bruits, no adenopathy   Chest:   clear to auscultation, symmetrical breath sounds, no wheezes, no rhonchi   CV:   RRR, grade IV/VI holosystolic murmur all across the precordium  Abdomen:  soft, non-tender, no masses   Extremities:  warm, well-perfused, pulses diminished at ankles  Rectal/GU  Deferred  Neuro:   Grossly non-focal and symmetrical throughout  Skin:   Clean and dry, no rashes, no breakdown  Diagnostic Tests:  Transesophageal Echocardiography  Patient: [name] [name], [name] [name] MR #: [id number] Study Date: 01/19/2012 ------------------------------------------------------------ LV EF: 65% - 70% ------------------------------------------------------------ ------------------------------------------------------------ Study Conclusions  - Left ventricle: The cavity size was normal. Wall thickness was normal. Systolic function was vigorous. The estimated ejection fraction was in the range of 65% to 70%. - Aortic valve: No evidence of vegetation. - Mitral valve: Severe prolapse, involving the medial and middle scallop of the posterior leaflet. Flail motion involving the middle scallop of the posterior leaflet due to rupture of one or more chords. Severe regurgitation directed anteriorly. - Left atrium: The atrium was mildly dilated. - Right atrium: No evidence of thrombus in the atrial cavity or appendage. - Atrial septum: No  defect or patent foramen ovale was identified. Echo contrast study showed no right-to-left atrial level shunt, following an increase in RA pressure induced by provocative maneuvers. - Tricuspid valve: No evidence of vegetation. ------------------------------------------------------------ Left ventricle: The cavity size was normal. Wall thickness was normal. Systolic function was vigorous. The estimated ejection fraction was in the range of 65% to 70%. ------------------------------------------------------------ Aortic valve: Structurally normal valve. Cusp separation was normal. No evidence of vegetation. Doppler: No regurgitation. ------------------------------------------------------------ Aorta: The aorta was normal, not dilated, and non-diseased. ------------------------------------------------------------ Mitral valve: Normal-sized annulus. Mildly thickened leaflets . Moderate myxomatous degeneration. Severe prolapse, involving the medial and middle scallop of the posterior leaflet. Flail motion involving the middle scallop of the posterior leaflet due to rupture of one or more chords. Doppler: Severe regurgitation directed anteriorly. ------------------------------------------------------------ Left atrium: The atrium was mildly dilated. The appendage was morphologically a left appendage. ------------------------------------------------------------ Atrial septum: No defect or patent foramen ovale was identified. Echo contrast study showed no right-to-left atrial level shunt, following an increase in RA pressure induced by provocative maneuvers. ------------------------------------------------------------ Right ventricle: The cavity size was normal. Wall thickness was normal. Systolic function was normal. ------------------------------------------------------------ Pulmonic valve: Doppler: Trivial  regurgitation. ------------------------------------------------------------ Tricuspid valve: Structurally normal valve. Leaflet separation was normal. No evidence of vegetation. Doppler: No regurgitation. ------------------------------------------------------------ Right atrium: The atrium was normal in size. No evidence of thrombus in the atrial cavity or appendage. ------------------------------------------------------------ Pericardium: The pericardium was normal in appearance. There was no pericardial effusion. ------------------------------------------------------------ Post procedure conclusions Ascending Aorta: - The aorta was normal, not dilated, and non-diseased. ----------------------------------------------------------- Doppler measurements Normal Mitral valve Regurg vena 5.41 mm ------ contracta Regurg alias 38.5 cm/s ------ vel, PISA Regurg 12.4 mm ------ radius, PISA Max regurg 464 cm/s ------ vel Regurg VTI 109 cm ------ ERO, PISA 0.8 cm^2 ------ ------------------------------------------------------------ Prepared and Electronically Authenticated by  Peter Swaziland, MD 2013-04-02T16:[id number]   Cardiac Catheterization Operative Report  [name] [name]  [id number]  4/3/201310:54 AM  No primary provider on file.  Procedure Performed:  1. Left Heart Catheterization 2.  Selective Coronary Angiography 3. Right Heart Catheterization 4. Left ventricular angiogram Operator: Verne Carrow, MD  Indication: Severe MR. Cath is for exclusion of CAD and for assessment of right sided and PA pressures.  Procedure Details:  The risks, benefits, complications, treatment options, and expected outcomes were discussed with the patient. The patient and/or family concurred with the proposed plan, giving informed consent. The patient was brought to the cath lab after IV hydration was begun and oral premedication was given. The patient was further sedated with Versed and  Fentanyl. The patient was prepped and draped in the usual manner. Using the modified Seldinger access technique, a 5 French sheath was placed in the femoral artery. A 6 French sheath was inserted into the right femoral vein. A balloon tipped catheter was used to perform a right heart catheterization. Standard diagnostic catheters were used to perform selective coronary angiography. A pigtail catheter was used to perform a left ventricular angiogram. There were no immediate complications. The patient was taken to the recovery area in stable condition.  Hemodynamic Findings:  Ao: 93/64  LV: 92/4/11  RA: 3  RV: 48/5/8  PA: 49/22 (mean 35)  PCWP: 20  Fick Cardiac Output: 3.1 L/min  Fick Cardiac Index: 1.77 L/min/m2  Central Aortic Saturation: 93%  Pulmonary Artery Saturation: 52%  Angiographic Findings:  Left main: No obstructive disease.  Left Anterior Descending Artery: Large vessel that courses to the apex. There is a moderate sized diagonal branch. No obstructive disease.  Circumflex Artery: Non-dominant. Small caliber artery with no obstructive disease noted.  Right Coronary Artery: Large dominant vessel with mild calcification in the proximal vessel. No obstructive disease noted.  Left Ventricular Angiogram: LVEF 55-60%. Severe MR.  Impression:  1. No angiographic evidence of obstructive CAD  2. Preserved LV systolic function  3. Severe MR   Impression:  Mitral valve prolapse with flail posterior leaflet and severe (4+) mitral regurgitation. The patient presents with relatively sudden progression of symptoms of class IV congestive heart failure that most likely coincide with recent development of multiple ruptured cords involving a large portion of the posterior leaflet. Left ventricular systolic function remains preserved and the patient does not have significant coronary artery disease. Physically the patient has features suggestive of underlying collagen vascular disorder such as  Marfan's syndrome. I agree that she needs mitral valve repair. She may be a good candidate for minimally invasive approach for surgery.   Plan:  The rationale for elective mitral valve repair surgery has been explained, including a comparison between surgery and continued medical therapy with close follow-up.  The likelihood of successful and durable valve repair has been discussed with particular reference to the findings of their recent echocardiogram.  Based upon these findings and previous experience, I have quoted them a greater than 80 percent likelihood of successful valve repair.  In the unlikely event that their valve cannot be successfully repaired, we discussed the possibility of replacing the mitral valve using a mechanical prosthesis with the attendant need for long-term anticoagulation versus the alternative of replacing it using a bioprosthetic tissue valve with its potential for late structural valve deterioration and failure, depending upon the patient's longevity.  The patient specifically requests that if the mitral valve must be replaced that it be done using a mechanical valve.  All questions have been addressed.  Alternative surgical approaches have been discussed, including a comparison between conventional sternotomy and minimally-invasive techniques.  The relative risks and benefits of each have been reviewed as they pertain  to the patient's specific circumstances, and all of their questions have been addressed.    We will proceed with CT angiogram of the chest to rule out aneurysmal enlargement of the aorta or other findings suggestive of Marfan's disease. We will obtain CT angiogram of the abdomen and pelvis to rule out significant aortoiliac occlusive disease which would preclude the ability to use femoral artery cannulation for surgery. We will tentatively plan for surgery next week. The patient is interested in possibly going home for a few days between now and then. This will  depend upon her clinical stability with respect to her congestive heart failure. I do favor starting amiodarone preoperatively to decrease her risk of perioperative atrial arrhythmias.    Salvatore Decent. Cornelius Moras, MD 01/20/2012 7:54 PM

## 2012-01-20 NOTE — CV Procedure (Addendum)
    Cardiac Catheterization Operative Report  [name] [name] [id number] 4/3/201310:54 AM No primary provider on file.  Procedure Performed:  1. Left Heart Catheterization 2. Selective Coronary Angiography 3. Right Heart Catheterization 4. Left ventricular angiogram  Operator: Verne Carrow, MD  Indication: Severe MR. Cath is for exclusion of CAD and for assessment of right sided and PA pressures.                                  Procedure Details: The risks, benefits, complications, treatment options, and expected outcomes were discussed with the patient. The patient and/or family concurred with the proposed plan, giving informed consent. The patient was brought to the cath lab after IV hydration was begun and oral premedication was given. The patient was further sedated with Versed and Fentanyl. The patient was prepped and draped in the usual manner. Using the modified Seldinger access technique, a 5 French sheath was placed in the femoral artery. A 6 French sheath was inserted into the right femoral vein. A balloon tipped catheter was used to perform a right heart catheterization. Standard diagnostic catheters were used to perform selective coronary angiography. A pigtail catheter was used to perform a left ventricular angiogram. There were no immediate complications. The patient was taken to the recovery area in stable condition.   Hemodynamic Findings: Ao:  93/64               LV:  92/4/11 RA:  3               RV:  48/5/8 PA:   49/22 (mean 35)      PCWP:  20 Fick Cardiac Output: 3.1   L/min Fick Cardiac Index:  1.77   L/min/m2 Central Aortic Saturation: 93% Pulmonary Artery Saturation: 52%   Angiographic Findings:  Left main: No obstructive disease.   Left Anterior Descending Artery: Large vessel that courses to the apex. There is a moderate sized diagonal branch. No obstructive disease.  Circumflex Artery: Non-dominant. Small caliber artery with no obstructive  disease noted.   Right Coronary Artery: Large dominant vessel with mild calcification in the proximal vessel. No obstructive disease noted.   Left Ventricular Angiogram: LVEF 55-60%. Severe MR.   Impression: 1. No angiographic evidence of obstructive CAD 2. Preserved LV systolic function 3. Severe MR  Recommendations: Proceed with surgical evaluation.        Complications:  None; patient tolerated the procedure well.

## 2012-01-20 NOTE — Progress Notes (Addendum)
Pre-op Cardiac Surgery  Carotid Findings:  No evidence of internal carotid artery stenosis bilaterally. Antegrade left vertebral artery, unable to visualize right vertebral artery.   Upper Extremity Right Left  Brachial Pressures Unable to take pressure- history of lymphectomy. Biphasic 113 Triphasic  Radial Waveforms Biphasic Biphasic  Ulnar Waveforms Triphasic Biphasic  Palmar Arch (Allen's Test) Doppler signal obliterates with radial compression, is unchanged with ulnar compression. Doppler signal remains normal with radial compression, decreases >50% with ulnar compression.   Findings:  See table above.    Lower  Extremity Right Left  Dorsalis Pedis    Anterior Tibial    Posterior Tibial    Ankle/Brachial Indices      Findings:  Strong pedal pulses felt.

## 2012-01-21 ENCOUNTER — Encounter (HOSPITAL_COMMUNITY): Payer: Self-pay | Admitting: Critical Care Medicine

## 2012-01-21 ENCOUNTER — Inpatient Hospital Stay (HOSPITAL_COMMUNITY): Payer: Medicare Other

## 2012-01-21 ENCOUNTER — Encounter (HOSPITAL_COMMUNITY): Payer: Self-pay | Admitting: Radiology

## 2012-01-21 ENCOUNTER — Other Ambulatory Visit: Payer: Self-pay

## 2012-01-21 ENCOUNTER — Inpatient Hospital Stay (HOSPITAL_COMMUNITY): Payer: Medicare Other | Admitting: Critical Care Medicine

## 2012-01-21 DIAGNOSIS — I059 Rheumatic mitral valve disease, unspecified: Secondary | ICD-10-CM

## 2012-01-21 DIAGNOSIS — I509 Heart failure, unspecified: Secondary | ICD-10-CM | POA: Diagnosis not present

## 2012-01-21 LAB — URINALYSIS, ROUTINE W REFLEX MICROSCOPIC
Glucose, UA: NEGATIVE mg/dL
Ketones, ur: NEGATIVE mg/dL
Nitrite: POSITIVE — AB
Protein, ur: NEGATIVE mg/dL

## 2012-01-21 LAB — COMPREHENSIVE METABOLIC PANEL
ALT: 67 U/L — ABNORMAL HIGH (ref 0–35)
AST: 47 U/L — ABNORMAL HIGH (ref 0–37)
Alkaline Phosphatase: 63 U/L (ref 39–117)
CO2: 25 mEq/L (ref 19–32)
Chloride: 103 mEq/L (ref 96–112)
GFR calc non Af Amer: >90 mL/min (ref >90)
Sodium: 138 mEq/L (ref 135–145)
Total Bilirubin: 0.7 mg/dL (ref 0.3–1.2)

## 2012-01-21 LAB — CBC
MCH: 32.2 pg (ref 26.0–34.0)
MCHC: 34.3 g/dL (ref 30.0–36.0)
Platelets: 263 10*3/uL (ref 150–400)
RDW: 13.7 % (ref 11.5–15.5)

## 2012-01-21 LAB — BLOOD GAS, ARTERIAL
Acid-Base Excess: 2.6 mmol/L — ABNORMAL HIGH (ref 0.0–2.0)
Bicarbonate: 26 mEq/L — ABNORMAL HIGH (ref 20.0–24.0)
TCO2: 27.1 mmol/L (ref 0–100)
pCO2 arterial: 35.2 mmHg (ref 35.0–45.0)
pH, Arterial: 7.479 — ABNORMAL HIGH (ref 7.350–7.400)

## 2012-01-21 LAB — URINE MICROSCOPIC-ADD ON

## 2012-01-21 LAB — PULMONARY FUNCTION TEST

## 2012-01-21 LAB — PREALBUMIN: Prealbumin: 13.9 mg/dL — ABNORMAL LOW (ref 17.0–34.0)

## 2012-01-21 LAB — MRSA PCR SCREENING: MRSA by PCR: NEGATIVE

## 2012-01-21 MED ORDER — LIDOCAINE HCL (CARDIAC) 20 MG/ML IV SOLN
INTRAVENOUS | Status: DC | PRN
  Administered 2012-01-21: 100 mg via INTRAVENOUS

## 2012-01-21 MED ORDER — FUROSEMIDE 10 MG/ML IJ SOLN
40.0000 mg | Freq: Every day | INTRAMUSCULAR | Status: DC
  Administered 2012-01-22: 40 mg via INTRAVENOUS
  Filled 2012-01-21: qty 4

## 2012-01-21 MED ORDER — LORAZEPAM 2 MG/ML IJ SOLN: 0.5000 mg | Freq: Once | INTRAMUSCULAR | Status: AC

## 2012-01-21 MED ORDER — AMIODARONE HCL IN DEXTROSE 360-4.14 MG/200ML-% IV SOLN
30.0000 mg/h | INTRAVENOUS | Status: DC
  Administered 2012-01-21 – 2012-01-22 (×2): 60 mg/h via INTRAVENOUS
  Administered 2012-01-22 – 2012-01-23 (×3): 30 mg/h via INTRAVENOUS
  Filled 2012-01-21 (×17): qty 200

## 2012-01-21 MED ORDER — PROPOFOL 10 MG/ML IV BOLUS
INTRAVENOUS | Status: DC | PRN
  Administered 2012-01-21: 40 mg via INTRAVENOUS

## 2012-01-21 MED ORDER — SODIUM CHLORIDE 0.9 % IV SOLN
INTRAVENOUS | Status: DC
  Administered 2012-01-23: 06:00:00 via INTRAVENOUS

## 2012-01-21 MED ORDER — ADENOSINE 6 MG/2ML IV SOLN
INTRAVENOUS | Status: AC
  Administered 2012-01-21: 6 mg
  Filled 2012-01-21: qty 4

## 2012-01-21 MED ORDER — TIOTROPIUM BROMIDE MONOHYDRATE 18 MCG IN CAPS
18.0000 ug | ORAL_CAPSULE | Freq: Every day | RESPIRATORY_TRACT | Status: DC
  Administered 2012-01-22 – 2012-01-25 (×4): 18 ug via RESPIRATORY_TRACT
  Filled 2012-01-21: qty 5

## 2012-01-21 MED ORDER — ALBUTEROL SULFATE (5 MG/ML) 0.5% IN NEBU
2.5000 mg | INHALATION_SOLUTION | Freq: Once | RESPIRATORY_TRACT | Status: AC
  Administered 2012-01-21: 2.5 mg via RESPIRATORY_TRACT

## 2012-01-21 MED ORDER — SODIUM CHLORIDE 0.9 % IV SOLN
INTRAVENOUS | Status: DC | PRN
  Administered 2012-01-21: 15:00:00 via INTRAVENOUS

## 2012-01-21 MED ORDER — LORAZEPAM 0.5 MG PO TABS
0.5000 mg | ORAL_TABLET | Freq: Once | ORAL | Status: AC
  Administered 2012-01-21: 0.5 mg via ORAL
  Filled 2012-01-21: qty 1

## 2012-01-21 MED ORDER — DEXTROSE 5 % IV SOLN
1.0000 mg/min | INTRAVENOUS | Status: DC
  Filled 2012-01-21: qty 9

## 2012-01-21 MED ORDER — ADENOSINE 6 MG/2ML IV SOLN: 6.0000 mg | Freq: Once | INTRAVENOUS | Status: AC

## 2012-01-21 MED ORDER — IOHEXOL 350 MG/ML SOLN
100.0000 mL | Freq: Once | INTRAVENOUS | Status: AC | PRN
  Administered 2012-01-21: 100 mL via INTRAVENOUS

## 2012-01-21 MED ORDER — FUROSEMIDE 10 MG/ML IJ SOLN
40.0000 mg | Freq: Once | INTRAMUSCULAR | Status: AC
  Administered 2012-01-21: 40 mg via INTRAVENOUS
  Filled 2012-01-21: qty 4

## 2012-01-21 MED ORDER — LORAZEPAM 0.5 MG PO TABS
0.5000 mg | ORAL_TABLET | Freq: Three times a day (TID) | ORAL | Status: DC | PRN
  Administered 2012-01-21 – 2012-01-24 (×4): 0.5 mg via ORAL
  Filled 2012-01-21 (×4): qty 1

## 2012-01-21 MED ORDER — AMIODARONE IV BOLUS ONLY 150 MG/100ML
150.0000 mg | Freq: Once | INTRAVENOUS | Status: AC
  Administered 2012-01-21: 150 mg via INTRAVENOUS
  Filled 2012-01-21: qty 100

## 2012-01-21 NOTE — Transfer of Care (Addendum)
Immediate Anesthesia Transfer of Care Note  Patient: [name] [name]  Procedure(s) Performed: * No procedures listed *  Patient Location: 2037  Anesthesia Type: General  Level of Consciousness: awake, alert  and oriented  Airway & Oxygen Therapy: Patient Spontanous Breathing and Patient connected to nasal cannula oxygen  Post-op Assessment: Report given to PACU RN, Post -op Vital signs reviewed and stable and Patient moving all extremities X 4  Post vital signs: Reviewed and stable  Complications: No apparent anesthesia complications

## 2012-01-21 NOTE — Progress Notes (Signed)
CSW went to patient's room to offer support and discuss HF management. Patient reported that she was tired and requested that CSW return at another time. CSW spoke with MD and patient's CHF is secondary to MR and therefore will resolve after her valve surgery. CSW will not return to discuss CHF management with patient per MD's instructions. Clinical Social Worker will sign off for now as social work intervention is no longer needed. Please consult [place] again if new need arises.   Sabino Niemann, MSW, [employer] [phone]

## 2012-01-21 NOTE — Progress Notes (Signed)
Patient: [name] [name] DOB: 1951-07-01 Date of Admission: 01/16/2012            Pulmonary consult  Date of Consult: 01/21/2012 MD requesting consult:  Myrtis Ser Reason for consult: pulm htn, surgical clearance  Subj-  Dyspnea unchanged    Filed Vitals:   01/20/12 1956 01/21/12 0520 01/21/12 0900 01/21/12 1100  BP: 131/81 123/82 127/85   Pulse: 110 119 120   Temp: 97.9 F (36.6 C) 97.9 F (36.6 C) 97 F (36.1 C)   TempSrc: Oral Oral Oral   Resp: 16 22 20    Height:      Weight:    63.7 kg (140 lb 6.9 oz)  SpO2: 94% 92% 97%     chest X-ray 3/30 Severe COPD.  Bilateral airspace opacities, right greater than left with  bilateral effusions. Asymmetric edema versus infection  PFTs combined severe restriction /obstruction with low DLCO c/w emphysema   CBC    Component Value Date/Time   WBC 8.7 01/21/2012 0515   WBC 5.2 02/19/2011 1518   RBC 4.32 01/21/2012 0515   RBC 4.25 02/19/2011 1518   HGB 13.9 01/21/2012 0515   HGB 14.1 02/19/2011 1518   HCT 40.5 01/21/2012 0515   HCT 40.3 02/19/2011 1518   PLT 263 01/21/2012 0515   PLT 216 02/19/2011 1518   MCV 93.8 01/21/2012 0515   MCV 94.8 02/19/2011 1518   MCH 32.2 01/21/2012 0515   MCH 33.2 02/19/2011 1518   MCHC 34.3 01/21/2012 0515   MCHC 35.0 02/19/2011 1518   RDW 13.7 01/21/2012 0515   RDW 12.5 02/19/2011 1518   LYMPHSABS 1.6 01/16/2012 2004   LYMPHSABS 1.3 02/19/2011 1518   MONOABS 1.6* 01/16/2012 2004   MONOABS 0.6 02/19/2011 1518   EOSABS 0.0 01/16/2012 2004   EOSABS 0.1 02/19/2011 1518   BASOSABS 0.0 01/16/2012 2004   BASOSABS 0.0 02/19/2011 1518     BMET    Component Value Date/Time   NA 138 01/21/2012 0515   K 4.1 01/21/2012 0515   CL 103 01/21/2012 0515   CO2 25 01/21/2012 0515   GLUCOSE 133* 01/21/2012 0515   BUN 21 01/21/2012 0515   CREATININE 0.50 01/21/2012 0515   CALCIUM 9.4 01/21/2012 0515   GFRNONAA >90 01/21/2012 0515   GFRAA >90 01/21/2012 0515       EXAM: General: thin female, NAD Neuro: awake and alert, MAE, appropriate - does have MS but 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 No bronchospasm.  Much improved with diuresis.  PLAN -  For TEE 01/19/12 and cath 01/20/12 Cant do PFTS now until cardiac studies done and pulm edema is better  Pulm HTN - PA pressure=68.  In setting CHF and severe mitral regurg with  Flail posterior  Leaflet  PLAN -  Per cards/ tcts   Mitral regurg - per cards  CHF - per cards    COPD with primary emphysema This pt can be cleared for planned MV surgery Plan Start Spiriva daily Check alpha one anti tryspin assay   Shan Levans, M.D. [employer]  [phone]  Cell  [phone]  If no response or cell goes to voicemail, call beeper [phone]

## 2012-01-21 NOTE — Progress Notes (Signed)
Patient ID: [name] [name], female   DOB: [birth year], 61 y.o.   MRN: [id number] The purpose of this note is to document that the patient did present with congestive heart failure. This is CHF secondary to mitral regurgitation. Therefore it really is not CHF from systolic failure or diastolic dysfunction. It is CHF from mitral regurgitation .  Jerral Bonito, MD

## 2012-01-21 NOTE — Anesthesia Preprocedure Evaluation (Addendum)
Anesthesia Evaluation  Patient identified by MRN, date of birth, ID band Patient awake    Airway       Dental  (+) Dental Advisory Given   Pulmonary shortness of breath,          Cardiovascular +CHF + Valvular Problems/Murmurs MR     Neuro/Psych    GI/Hepatic   Endo/Other  Hypothyroidism   Renal/GU      Musculoskeletal   Abdominal   Peds  Hematology   Anesthesia Other Findings   Reproductive/Obstetrics                           Anesthesia Physical Anesthesia Plan  ASA: III  Anesthesia Plan: General   Post-op Pain Management:    Induction: Intravenous  Airway Management Planned: Mask  Additional Equipment:   Intra-op Plan:   Post-operative Plan:   Informed Consent: I have reviewed the patients History and Physical, chart, labs and discussed the procedure including the risks, benefits and alternatives for the proposed anesthesia with the patient or authorized representative who has indicated his/her understanding and acceptance.   Dental advisory given  Plan Discussed with: CRNA, Anesthesiologist and Surgeon  Anesthesia Plan Comments:         Anesthesia Quick Evaluation

## 2012-01-21 NOTE — Progress Notes (Signed)
01/21/12 1630 UR Completed. Tera Mater, RN, BSN Nurse Case Manager [phone]

## 2012-01-21 NOTE — Progress Notes (Signed)
Pt blood pressures remain low.  Pt is alert and oriented without any distress.  Pt aware she will move to CCICU once bed available.  Pt is currently on 4L O2 sats of 94%, HR=105, BP=85/50. Amio drip and NS running currently.  Will continue to monitor pt until transfer.  Pt is set up on frequent vitals.  Call bell within reach and pt watching TV. Thomas Hoff

## 2012-01-21 NOTE — Progress Notes (Signed)
Called by patient's RN regarding HR in 180s, SBP 90s. Upon review of telemetry, patient noted be in a regular, narrow-complex tachycardia at 140-180 bpm. Reviewed by myself and Dr. Excell Seltzer. 12-lead done at bedside. Noted to be a SVT vs rapid atrial flutter initially. Patient given Adenosine 6mg  IV with a transient decrease in HR, but without conversion to NSR. Returned to regular, narrow-complex tachycardia. On telemetry review of Adenosine infusion, trace P waves were noted initially, followed by fibrillation waves. No flutter waves noted. Decision was made to proceed with DCCV.   Patient seen, examined. Available data reviewed. Agree with findings, assessment, and plan as outlined by Hurman Horn, PA-C. As per note above, I initially thought pt was in SVT with regular narrow-complex tach at 180 bpm. After IV adenosine the patient was clearly in AF with RVR. She became increasingly hypotensive with SBP approx 70. She was able to maintain normal mentation throughout. With unstable rapid AF I elected to treat her with IV amio and emergent DCCV with anesthesia assistance. The patient was given Amio 150 mg over 10 min and DCCV was performed with propofol 40 mg IV, followed by one synchronized biphasic shock with 120 joules. The patient converted to sinus rhythm with recovery BP of 82/49. She is awake and mentating normally post-cardioversion. Will continue IV amiodarone and would favor continuation of this until she goes to the OR. Will continue to follow closely and will transfer patient to a CCU bed as her BP remains low.   Critical care time spent with patient = 45 minutes.  Tonny Bollman, M.D. 01/21/2012 2:56 PM

## 2012-01-21 NOTE — Clinical Documentation Improvement (Signed)
CHF DOCUMENTATION CLARIFICATION QUERY  THIS DOCUMENT IS NOT A PERMANENT PART OF THE MEDICAL RECORD  TO RESPOND TO THE THIS QUERY, FOLLOW THE INSTRUCTIONS BELOW:  1. If needed, update documentation for the patient's encounter via the notes activity.  2. Access this query again and click edit on the In [employer].  3. After updating, or not, click F2 to complete all highlighted (required) fields concerning your review. Select "additional documentation in the medical record" OR "no additional documentation provided".  4. Click Sign note button.  5. The deficiency will fall out of your In Basket *Please let us know if you are not able to complete this workflow by phone or e-mail (listed below).  Please update your documentation within the medical record to reflect your response to this query.                                                                                    01/21/12  Dear Dr. Shela Commons. [facility],  In a better effort to capture your patient's severity of illness, reflect appropriate length of stay and utilization of resources, a review of the patient medical record has revealed the following indicators the diagnosis of Heart Failure.    Based on your clinical judgment, please clarify and document in a progress note and/or discharge summary the clinical condition associated with the following supporting information:  In responding to this query please exercise your independent judgment.  The fact that a query is asked, does not imply that any particular answer is desired or expected.  Initial cause for admit thought to be "acute decompensated heart failure" .  Pro BNP at time of admit 7500 elevated. Please clarify " type of chf" that the patient has/had if known.  Thank you      Possible Clinical Conditions?  Chronic Systolic Congestive Heart Failure Chronic Diastolic Congestive Heart Failure Chronic Systolic & Diastolic Congestive Heart Failure Acute Systolic  Congestive Heart Failure Acute Diastolic Congestive Heart Failure Acute Systolic & Diastolic Congestive Heart Failure Acute on Chronic Systolic Congestive Heart Failure Acute on Chronic Diastolic Congestive Heart Failure Acute on Chronic Systolic & Diastolic  Congestive Heart Failure Other Condition________CHF from severe mitral regurgitation________________________________ Cannot Clinically Determine  Supporting Information:  Risk Factors: CAD, MVR  Signs & Symptoms: mild exertional shortness of breath and orthopnea/progressing to severe resting sob And a dry couth   Diagnostics: 2 Decho done/ CXR: bilateral airspace opacities consistent with vascular congestion  Treatment: IV Lasix 20 mg q 12 hrs   Reviewed:   Thank You,  Leonette Most Addison  Clinical Documentation Specialist RN, BSN:  Pager [phone] HIM off [phone]  Health Information Management [place]

## 2012-01-21 NOTE — Progress Notes (Signed)
CARDIOTHORACIC SURGERY PROGRESS NOTE  1 Day Post-Op  S/P Procedure(s) (LRB): LEFT AND RIGHT HEART CATHETERIZATION WITH CORONARY ANGIOGRAM (N/A)  Subjective: Events noted.  Currently feels much better.  Objective: Vital signs in last 24 hours: Temp:  [97 F (36.1 C)-97.9 F (36.6 C)] 97.9 F (36.6 C) (04/04 1338) Pulse Rate:  [16-185] 102  (04/04 1800) Cardiac Rhythm:  [-] Normal sinus rhythm (04/04 0800) Resp:  [16-28] 24  (04/04 1800) BP: (69-131)/(32-85) 101/55 mmHg (04/04 1800) SpO2:  [92 %-98 %] 97 % (04/04 1800) Weight:  [63.7 kg (140 lb 6.9 oz)] 63.7 kg (140 lb 6.9 oz) (04/04 1100)  Physical Exam:  Rhythm:   sinus  Breath sounds: Diminished at bases  Heart sounds:  RRR with prominent murmur  Incisions:  n/a  Abdomen:  soft  Extremities:  warm   Intake/Output from previous day: 04/03 0701 - 04/04 0700 In: -  Out: 400 [Urine:400] Intake/Output this shift: Total I/O In: 380 [P.O.:360; I.V.:20] Out: 750 [Urine:750]  Lab Results:  [facility] 01/21/12 0515  WBC 8.7  HGB 13.9  HCT 40.5  PLT 263   BMET:  Basename 01/21/12 0515 01/20/12 0505  NA 138 138  K 4.1 3.7  CL 103 102  CO2 25 24  GLUCOSE 133* 122*  BUN 21 23  CREATININE 0.50 0.62  CALCIUM 9.4 8.9    CBG (last 3)  No results found for this basename: GLUCAP:3 in the last 72 hours PT/INR:   Basename 01/21/12 0515  LABPROT 14.6  INR 1.12    CTA: CT ANGIOGRAPHY CHEST, ABDOMEN AND PELVIS  Technique: Multidetector CT imaging through the chest, abdomen and  pelvis was performed using the standard protocol during bolus  administration of intravenous contrast. Multiplanar reconstructed  images including MIPs were obtained and reviewed to evaluate the  vascular anatomy.  Contrast: 100 ml Omnipaque 300 IV.  Comparison: None.  CTA CHEST  Findings: There is tortuosity of the thoracic aorta which is  nonaneurysmal. Largest diameter is in the ascending aorta  measuring 3.0 cm. Aortic arch measures  2.4 cm. Proximal  descending thoracic aorta measures 2.3 cm. Descending aorta at the  aortic hiatus measures 2.1 cm. Minimal calcified plaque in the  aortic arch and distal descending thoracic aorta.  There are large bilateral pleural effusions. Compressive  atelectasis in the lower lobes bilaterally. Heart is upper limits  normal in size. There are ground-glass opacities within the lungs  bilaterally which could reflect edema or bronchiolitis. Mildly  enlarged pretracheal lymph node measures 13 mm in short axis  diameter. No hilar or axillary adenopathy.  No acute bony abnormality. Calcifications noted in the proximal  left anterior descending coronary artery.  Review of the MIP images confirms the above findings.  IMPRESSION:  No evidence of aortic aneurysm or dissection.  Large bilateral pleural effusions. Ground-glass opacities within  the lungs could reflect edema or bronchiolitis/alveolitis.  Heart is borderline in size. Coronary artery calcifications.  CTA ABDOMEN AND PELVIS  Findings: No evidence of aortic aneurysm or dissection. Scattered  atherosclerotic calcifications in the infrarenal aorta and common  iliac arteries. Iliofemoral vessels are patent without stenosis.  Celiac, superior mesenteric artery, inferior mesenteric artery and  single renal arteries bilaterally are patent without focal  stenosis.  Liver, spleen, pancreas, adrenals and kidneys are unremarkable.  Small amount of free fluid in the pelvis. Uterus and adnexa as  well as urinary bladder grossly unremarkable. Large and small  bowel are grossly unremarkable. No evidence of obstruction.  Review of the MIP images confirms the above findings.  IMPRESSION:  No evidence of aortic aneurysm or dissection. Atherosclerotic  change in the distal aorta and common iliac arteries.  No acute findings in the abdomen or pelvis.  Original Report Authenticated By: Cyndie Chime, M.D.     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  At present the patient looks good and remains stable.  We plan minimally invasive mitral valve repair and maze procedure next Tuesday.  I spent in excess of 30 minutes reviewing the indications, risks, and benefits of surgery as well as expectations for her postoperative recovery.  All questions answered.  Roberts Bon H 01/21/2012 6:29 PM

## 2012-01-21 NOTE — Preoperative (Signed)
Beta Blockers   Reason not to administer Beta Blockers:Not Applicable 

## 2012-01-21 NOTE — Progress Notes (Addendum)
Patient ID: [name] [name], female   DOB: March 26, 1951, 61 y.o.   MRN: [id number]   SUBJECTIVE:Stable post cath. Appreciate help Dr. Cornelius Moras with plan for MVR. The patient is very anxious today. She has used Ativan in the past on a when necessary basis and would like to have some. In addition she does have some increased shortness of breath. I will give her Lasix this morning.    Filed Vitals:   01/20/12 1430 01/20/12 1500 01/20/12 1956 01/21/12 0520  BP: 107/71 102/72 131/81 123/82  Pulse: 94 94 110 119  Temp:   97.9 F (36.6 C) 97.9 F (36.6 C)  TempSrc:   Oral Oral  Resp: 18 18 16 22   Height:      Weight:      SpO2: 95% 95% 94% 92%    Intake/Output Summary (Last 24 hours) at 01/21/12 0731 Last data filed at 01/20/12 1900  Gross per 24 hour  Intake      0 ml  Output    300 ml  Net   -300 ml    LABS: Basic Metabolic Panel:  Basename 01/21/12 0515 01/20/12 0505  NA 138 138  K 4.1 3.7  CL 103 102  CO2 25 24  GLUCOSE 133* 122*  BUN 21 23  CREATININE 0.50 0.62  CALCIUM 9.4 8.9  MG -- --  PHOS -- --   Liver Function Tests:  [facility] 01/21/12 0515  AST 47*  ALT 67*  ALKPHOS 63  BILITOT 0.7  PROT 5.9*  ALBUMIN 2.5*   No results found for this basename: LIPASE:2,AMYLASE:2 in the last 72 hours CBC:  Basename 01/21/12 0515  WBC 8.7  NEUTROABS --  HGB 13.9  HCT 40.5  MCV 93.8  PLT 263   Cardiac Enzymes: No results found for this basename: CKTOTAL:3,CKMB:3,CKMBINDEX:3,TROPONINI:3 in the last 72 hours BNP: No components found with this basename: POCBNP:3 D-Dimer: No results found for this basename: DDIMER:2 in the last 72 hours Hemoglobin A1C: No results found for this basename: HGBA1C in the last 72 hours Fasting Lipid Panel: No results found for this basename: CHOL,HDL,LDLCALC,TRIG,CHOLHDL,LDLDIRECT in the last 72 hours Thyroid Function Tests: No results found for this basename: TSH,T4TOTAL,FREET3,T3FREE,THYROIDAB in the last 72 hours  RADIOLOGY: Dg  Chest 2 View  01/16/2012  *RADIOLOGY REPORT*  Clinical Data: Cough, congestion, fever, shortness of breath.  CHEST - 2 VIEW  Comparison: 02/24/2011  Findings: Severe COPD changes.  Patchy bilateral airspace opacities are noted, right greater than left with bilateral pleural effusions.  Heart size is upper limits normal.  Stable right apical pleural thickening.  Surgical clips in the right axilla and breast.  IMPRESSION: Severe COPD.  Bilateral airspace opacities, right greater than left with bilateral effusions.  Asymmetric edema versus infection.  Original Report Authenticated By: Cyndie Chime, M.D.    PHYSICAL EXAM stable , she is anxious understandably. She is oriented to person time and place. Affect is normal. There is no jugular venous distention. She has a few scattered rhonchi. Abdomen is soft. There is no significant peripheral edema.   TELEMETRY: I have reviewed. NSR   ASSESSMENT AND PLAN:   *CHF (congestive heart failure)    We will resume her Lasix this morning.   HYPOTHYROIDISM  Multiple sclerosis   MITRAL REGURGITATION   Severe. Plan for MVR   NEUROGENIC BLADDER  URINARY INCONTINENCE  BREAST CANCER, HX OF   Anxiety   Call our to have Ativan on a when necessary basis.   Tachycardia  Rhythm  stable   Pulmonary hypertension  Hypoxemia   Hypokalemia  Potassium stable yesterday. Repeat AM.  As of this AM I favor patient remaining in the hospital until surgery. The patient agrees.   Willa Rough 01/21/2012 7:31 AM

## 2012-01-21 NOTE — Anesthesia Postprocedure Evaluation (Addendum)
  Anesthesia Post-op Note  Patient: [name] [name]  Procedure(s) Performed: * No procedures listed *  Patient Location: 2037  Anesthesia Type: General  Level of Consciousness: awake, alert  and oriented  Airway and Oxygen Therapy: Patient Spontanous Breathing and Patient connected to nasal cannula oxygen  Post-op Pain: none  Post-op Assessment: Post-op Vital signs reviewed, PATIENT'S CARDIOVASCULAR STATUS UNSTABLE and Respiratory Function Stable  Post-op Vital Signs: Reviewed  Complications: No apparent anesthesia complications and Dr. Excell Seltzer and Dr. Katrinka Blazing aware of pt's hypotension.

## 2012-01-22 ENCOUNTER — Inpatient Hospital Stay (HOSPITAL_COMMUNITY): Payer: Medicare Other

## 2012-01-22 DIAGNOSIS — J439 Emphysema, unspecified: Secondary | ICD-10-CM | POA: Diagnosis present

## 2012-01-22 DIAGNOSIS — G35 Multiple sclerosis: Secondary | ICD-10-CM

## 2012-01-22 LAB — BASIC METABOLIC PANEL
CO2: 26 mEq/L (ref 19–32)
Calcium: 9.1 mg/dL (ref 8.4–10.5)
GFR calc non Af Amer: >90 mL/min (ref >90)
Sodium: 135 mEq/L (ref 135–145)

## 2012-01-22 LAB — ALPHA-1-ANTITRYPSIN: A-1 Antitrypsin, Ser: 208 mg/dL — ABNORMAL HIGH (ref 90–200)

## 2012-01-22 LAB — HEPARIN LEVEL (UNFRACTIONATED)
Heparin Unfractionated: 0.43 IU/mL (ref 0.30–0.70)
Heparin Unfractionated: 0.45 IU/mL (ref 0.30–0.70)

## 2012-01-22 MED ORDER — FUROSEMIDE 10 MG/ML IJ SOLN
40.0000 mg | Freq: Two times a day (BID) | INTRAMUSCULAR | Status: DC
  Filled 2012-01-22 (×3): qty 4

## 2012-01-22 MED ORDER — HEPARIN (PORCINE) IN NACL 100-0.45 UNIT/ML-% IJ SOLN
1150.0000 [IU]/h | INTRAMUSCULAR | Status: DC
  Administered 2012-01-22 – 2012-01-23 (×2): 1050 [IU]/h via INTRAVENOUS
  Administered 2012-01-24 (×2): 1150 [IU]/h via INTRAVENOUS
  Filled 2012-01-22 (×6): qty 250

## 2012-01-22 MED ORDER — POTASSIUM CHLORIDE CRYS ER 20 MEQ PO TBCR
20.0000 meq | EXTENDED_RELEASE_TABLET | Freq: Every day | ORAL | Status: DC
  Administered 2012-01-22: 20 meq via ORAL
  Filled 2012-01-22 (×2): qty 1
  Filled 2012-01-22: qty 2

## 2012-01-22 MED ORDER — HEPARIN BOLUS VIA INFUSION
4000.0000 [IU] | Freq: Once | INTRAVENOUS | Status: AC
  Administered 2012-01-22: 4000 [IU] via INTRAVENOUS
  Filled 2012-01-22: qty 4000

## 2012-01-22 MED ORDER — POTASSIUM CHLORIDE CRYS ER 20 MEQ PO TBCR
40.0000 meq | EXTENDED_RELEASE_TABLET | Freq: Once | ORAL | Status: AC
  Administered 2012-01-22: 40 meq via ORAL

## 2012-01-22 MED ORDER — FUROSEMIDE 10 MG/ML IJ SOLN
40.0000 mg | Freq: Once | INTRAMUSCULAR | Status: AC
  Administered 2012-01-22: 40 mg via INTRAVENOUS
  Filled 2012-01-22: qty 4

## 2012-01-22 NOTE — Progress Notes (Signed)
Patient ID: [name] [name], female   DOB: 05/17/1951, 61 y.o.   MRN: [id number]   The patient had some increased shortness of breath in the middle of the day today. There was concern that this might be anxiety. However in addition her initial CT scan did show bilateral effusions. The patient has not had a followup chest x-ray and I have just ordered one for this evening. Dr. Cornelius Moras called me to discuss the fact that he was concerned that the patient might still be fluid overloaded. I have ordered an extra 40 mg of Lasix for now. I've also ordered that her Lasix be increased from 40 IV daily to 40 twice a day. I have ordered extra potassium. A BMet is ordered for tomorrow morning. The patient will be seen on rounds by our team tomorrow for further review of the chest film and response to diuretics and her labs. She has severe mitral regurgitation. She is scheduled for surgery on Tuesday. Hopefully, if she does have significant effusions present, they will diuresis with increased meds. If the patient's chest x-ray this evening does not reveal significant effusions, we will probably need to cut her Lasix dose back to once daily.  Jerral Bonito, MD

## 2012-01-22 NOTE — Progress Notes (Signed)
Patient ID: [name] [name], female   DOB: 03/17/1951, 61 y.o.   MRN: [id number]   SUBJECTIVE:  The patient is quite stable today. Yesterday was complicated with a burst of supraventricular tachycardia. After adenosine she converted atrial flutter with a rapid rate and hypotension. She was immediately cardioverted. Over a brief period of time her blood pressure improved when she was back in sinus rhythm. She has not had any further arrhythmias. She is on IV amiodarone and IV heparin. She looks very good today.   Filed Vitals:   01/22/12 0700 01/22/12 0753 01/22/12 0800 01/22/12 0919  BP: 106/54  108/68   Pulse:  103 104   Temp:  97.8 F (36.6 C)    TempSrc:  Oral    Resp:  18 22   Height:      Weight:      SpO2:  94% 92% 94%    Intake/Output Summary (Last 24 hours) at 01/22/12 0934 Last data filed at 01/22/12 0600  Gross per 24 hour  Intake  733.6 ml  Output   1150 ml  Net -416.4 ml    LABS: Basic Metabolic Panel:  Basename 01/22/12 0700 01/21/12 0515  NA 135 138  K 3.7 4.1  CL 99 103  CO2 26 25  GLUCOSE 123* 133*  BUN 20 21  CREATININE 0.64 0.50  CALCIUM 9.1 9.4  MG -- --  PHOS -- --   Liver Function Tests:  Basename 01/21/12 0515  AST 47*  ALT 67*  ALKPHOS 63  BILITOT 0.7  PROT 5.9*  ALBUMIN 2.5*   No results found for this basename: LIPASE:2,AMYLASE:2 in the last 72 hours CBC:  Basename 01/21/12 0515  WBC 8.7  NEUTROABS --  HGB 13.9  HCT 40.5  MCV 93.8  PLT 263   Cardiac Enzymes: No results found for this basename: CKTOTAL:3,CKMB:3,CKMBINDEX:3,TROPONINI:3 in the last 72 hours BNP: No components found with this basename: POCBNP:3 D-Dimer: No results found for this basename: DDIMER:2 in the last 72 hours Hemoglobin A1C:  Basename 01/21/12 0515  HGBA1C 5.3   Fasting Lipid Panel: No results found for this basename: CHOL,HDL,LDLCALC,TRIG,CHOLHDL,LDLDIRECT in the last 72 hours Thyroid Function Tests: No results found for this basename:  TSH,T4TOTAL,FREET3,T3FREE,THYROIDAB in the last 72 hours  RADIOLOGY: Dg Chest 2 View  01/16/2012  *RADIOLOGY REPORT*  Clinical Data: Cough, congestion, fever, shortness of breath.  CHEST - 2 VIEW  Comparison: 02/24/2011  Findings: Severe COPD changes.  Patchy bilateral airspace opacities are noted, right greater than left with bilateral pleural effusions.  Heart size is upper limits normal.  Stable right apical pleural thickening.  Surgical clips in the right axilla and breast.  IMPRESSION: Severe COPD.  Bilateral airspace opacities, right greater than left with bilateral effusions.  Asymmetric edema versus infection.  Original Report Authenticated By: Cyndie Chime, M.D.   Ct Angio Chest W/cm &/or Wo Cm  01/21/2012  *RADIOLOGY REPORT*  Clinical Data:  Shortness of breath.  Rule out thoracic aortic aneurysm.  Rule out aorto occlusive disease.  CT ANGIOGRAPHY CHEST, ABDOMEN AND PELVIS  Technique:  Multidetector CT imaging through the chest, abdomen and pelvis was performed using the standard protocol during bolus administration of intravenous contrast.  Multiplanar reconstructed images including MIPs were obtained and reviewed to evaluate the vascular anatomy.  Contrast:  100 ml Omnipaque 300 IV.  Comparison:  None.  CTA CHEST  Findings:  There is tortuosity of the thoracic aorta which is nonaneurysmal.  Largest diameter is in the ascending aorta  measuring 3.0 cm.  Aortic arch measures 2.4 cm.  Proximal descending thoracic aorta measures 2.3 cm.  Descending aorta at the aortic hiatus measures 2.1 cm.  Minimal calcified plaque in the aortic arch and distal descending thoracic aorta.  There are large bilateral pleural effusions.  Compressive atelectasis in the lower lobes bilaterally.  Heart is upper limits normal in size.  There are ground-glass opacities within the lungs bilaterally which could reflect edema or bronchiolitis.  Mildly enlarged pretracheal lymph node measures 13 mm in short axis diameter.  No  hilar or axillary adenopathy.  No acute bony abnormality. Calcifications noted in the proximal left anterior descending coronary artery.   Review of the MIP images confirms the above findings.  IMPRESSION: No evidence of aortic aneurysm or dissection.  Large bilateral pleural effusions.  Ground-glass opacities within the lungs could reflect edema or bronchiolitis/alveolitis.  Heart is borderline in size.  Coronary artery calcifications.  CTA ABDOMEN AND PELVIS  Findings:  No evidence of aortic aneurysm or dissection.  Scattered atherosclerotic calcifications in the infrarenal aorta and common iliac arteries.  Iliofemoral vessels are patent without stenosis. Celiac, superior mesenteric artery, inferior mesenteric artery and single renal arteries bilaterally are patent without focal stenosis.  Liver, spleen, pancreas, adrenals and kidneys are unremarkable.  Small amount of free fluid in the pelvis.  Uterus and adnexa as well as urinary bladder grossly unremarkable.  Large and small bowel are grossly unremarkable.  No evidence of obstruction.   Review of the MIP images confirms the above findings.  IMPRESSION: No evidence of aortic aneurysm or dissection. Atherosclerotic change in the distal aorta and common iliac arteries.  No acute findings in the abdomen or pelvis.  Original Report Authenticated By: Cyndie Chime, M.D.   Ct Angio Abd/pel W/ And/or W/o  01/21/2012  *RADIOLOGY REPORT*  Clinical Data:  Shortness of breath.  Rule out thoracic aortic aneurysm.  Rule out aorto occlusive disease.  CT ANGIOGRAPHY CHEST, ABDOMEN AND PELVIS  Technique:  Multidetector CT imaging through the chest, abdomen and pelvis was performed using the standard protocol during bolus administration of intravenous contrast.  Multiplanar reconstructed images including MIPs were obtained and reviewed to evaluate the vascular anatomy.  Contrast:  100 ml Omnipaque 300 IV.  Comparison:  None.  CTA CHEST  Findings:  There is tortuosity of the  thoracic aorta which is nonaneurysmal.  Largest diameter is in the ascending aorta measuring 3.0 cm.  Aortic arch measures 2.4 cm.  Proximal descending thoracic aorta measures 2.3 cm.  Descending aorta at the aortic hiatus measures 2.1 cm.  Minimal calcified plaque in the aortic arch and distal descending thoracic aorta.  There are large bilateral pleural effusions.  Compressive atelectasis in the lower lobes bilaterally.  Heart is upper limits normal in size.  There are ground-glass opacities within the lungs bilaterally which could reflect edema or bronchiolitis.  Mildly enlarged pretracheal lymph node measures 13 mm in short axis diameter.  No hilar or axillary adenopathy.  No acute bony abnormality. Calcifications noted in the proximal left anterior descending coronary artery.   Review of the MIP images confirms the above findings.  IMPRESSION: No evidence of aortic aneurysm or dissection.  Large bilateral pleural effusions.  Ground-glass opacities within the lungs could reflect edema or bronchiolitis/alveolitis.  Heart is borderline in size.  Coronary artery calcifications.  CTA ABDOMEN AND PELVIS  Findings:  No evidence of aortic aneurysm or dissection.  Scattered atherosclerotic calcifications in the infrarenal aorta and common iliac arteries.  Iliofemoral vessels are patent without stenosis. Celiac, superior mesenteric artery, inferior mesenteric artery and single renal arteries bilaterally are patent without focal stenosis.  Liver, spleen, pancreas, adrenals and kidneys are unremarkable.  Small amount of free fluid in the pelvis.  Uterus and adnexa as well as urinary bladder grossly unremarkable.  Large and small bowel are grossly unremarkable.  No evidence of obstruction.   Review of the MIP images confirms the above findings.  IMPRESSION: No evidence of aortic aneurysm or dissection. Atherosclerotic change in the distal aorta and common iliac arteries.  No acute findings in the abdomen or pelvis.   Original Report Authenticated By: Cyndie Chime, M.D.    PHYSICAL EXAM  Patient is stable today. Using when necessary Ativan she is much less anxious. She is recovered completely from her episode of atrial fibrillation with low blood pressure yesterday. She is oriented to person time and place. Affect is normal. Lungs are clear. Respiratory effort is not labored. Cardiac exam reveals S1 and S2 and her murmur of mitral regurgitation. The abdomen is soft. There is no peripheral edema.   TELEMETRY: I have reviewed telemetry. She has normal sinus rhythm.   ASSESSMENT AND PLAN:   *MITRAL REGURGITATION  Patient will have surgery for mitral valve repair next week   HYPOTHYROIDISM  Multiple sclerosis  NEUROGENIC BLADDER  URINARY INCONTINENCE  BREAST CANCER, HX OF    CHF (congestive heart failure) Her CHF is related to her mitral regurgitation. Her volume status is stable. She does not need medications for left ventricular dysfunction.   Anxiety   When necessary Ativan is helping her.    Pulmonary hypertension  We feel this is related primarily to her mitral valve disease. She does have pulmonary disease.   Hypokalemia  Potassium is 3.7. I've increased her daily potassium dosing in order be made for tomorrow.   COPD with emphysema  Appreciate help from the critical care team. She does have some underlying lung disease. They are adjusting her medications.   Atrial fibrillation  Her supraventricular arrhythmias her new starting yesterday. It is my understanding that was SVT followed by adenosine followed by atrial fibrillation. She is on IV amiodarone and IV heparin at this point.   Willa Rough 01/22/2012 9:34 AM

## 2012-01-22 NOTE — Progress Notes (Signed)
HPI:  Patient is a 61 year old woman with a history of remotely treated breast cancer, hypothyroidism, and a reported history of incidentally noted mitral regurgitation years ago who presented to [facility] on admission with acute shortness of breath.She underwent cardiac cath which showed no coronary disease, 2D Echo demenstrated severe MR, she will be undergoing mitral valve replacement next week.   Antibiotics:   None  Cultures/Sepsis Markers:   None  Access/Protocols:   Best Practice: DVT: Heparin GI: Protonix   Subjective:   Physical Exam: Filed Vitals:   01/22/12 0800  BP: 108/68  Pulse: 104  Temp:   Resp: 22    Intake/Output Summary (Last 24 hours) at 01/22/12 0938 Last data filed at 01/22/12 0600  Gross per 24 hour  Intake  733.6 ml  Output   1150 ml  Net -416.4 ml     GEN no acute distress; thin appearing woman sitting with head of the bed at 45 degrees  NECK supple, JVP is elevated at 12-14 cm H2O  CHEST diffuse bibasillar crackles, no wheezing  CARDIAC regular, S1 S2, holosystolic murmur heard best at apex ABDOMEN soft, non-distended, non-tender, liver non-pulsatile EXT warm, no edema present  NEURO alert & oriented  SKIN warm & dry   Labs: CBC:    Component Value Date/Time   WBC 8.7 01/21/2012 0515   WBC 5.2 02/19/2011 1518   HGB 13.9 01/21/2012 0515   HGB 14.1 02/19/2011 1518   HCT 40.5 01/21/2012 0515   HCT 40.3 02/19/2011 1518   PLT 263 01/21/2012 0515   PLT 216 02/19/2011 1518   MCV 93.8 01/21/2012 0515   MCV 94.8 02/19/2011 1518   NEUTROABS 11.6* 01/16/2012 2004   NEUTROABS 3.1 02/19/2011 1518   LYMPHSABS 1.6 01/16/2012 2004   LYMPHSABS 1.3 02/19/2011 1518   MONOABS 1.6* 01/16/2012 2004   MONOABS 0.6 02/19/2011 1518   EOSABS 0.0 01/16/2012 2004   EOSABS 0.1 02/19/2011 1518   BASOSABS 0.0 01/16/2012 2004   BASOSABS 0.0 02/19/2011 1518     Basic Metabolic Panel:    Component Value Date/Time   NA 135 01/22/2012 0700   K 3.7 01/22/2012 0700   CL 99  01/22/2012 0700   CO2 26 01/22/2012 0700   BUN 20 01/22/2012 0700   CREATININE 0.64 01/22/2012 0700   GLUCOSE 123* 01/22/2012 0700   CALCIUM 9.1 01/22/2012 0700   Comprehensive Metabolic Panel:    Component Value Date/Time   NA 135 01/22/2012 0700   K 3.7 01/22/2012 0700   CL 99 01/22/2012 0700   CO2 26 01/22/2012 0700   BUN 20 01/22/2012 0700   CREATININE 0.64 01/22/2012 0700   GLUCOSE 123* 01/22/2012 0700   CALCIUM 9.1 01/22/2012 0700   AST 47* 01/21/2012 0515   ALT 67* 01/21/2012 0515   ALKPHOS 63 01/21/2012 0515   BILITOT 0.7 01/21/2012 0515   PROT 5.9* 01/21/2012 0515   ALBUMIN 2.5* 01/21/2012 0515    ABG    Component Value Date/Time   PHART 7.479* 01/21/2012 0505   PCO2ART 35.2 01/21/2012 0505   PO2ART 52.4* 01/21/2012 0505   HCO3 26.0* 01/21/2012 0505   TCO2 27.1 01/21/2012 0505   O2SAT 86.6 01/21/2012 0505     Lab 01/17/12 0228  MG 1.8   Lab Results  Component Value Date   CALCIUM 9.1 01/22/2012    Chest Xray: Severe COPD. Bilateral airspace opacities, right greater than left with bilateral effusions.  Assessment & Plan:  Acute Severe Mitral Valve  Regurgitation  Going to surgery next week, continue to optimize volume status with IV lasix, may also consider vasodilators such for bridging until surgery, as vasodilators decreases left ventricular end-diastolic pressure and volume while increasing forward stroke volume and cardiac index. Hydralazine has similar salutary effects. In contrast, the acute effect of angiotensin converting enzyme (ACE) inhibitors or nitrates is usually a decrease in the cardiac index. Therefore D/C Lisinopril.  COPD Continue spiriva, avoid bronchodilators if possible, given risk for tachycardia exacerbation.  AFIB Now NSR after cardioversion, now on amiodarone.   Darnelle Maffucci, MD  Patient seen and examined, agree with above note.  I dictated the care and orders written for this patient under my direction.  Koren Bound, M.D. [phone]

## 2012-01-22 NOTE — Progress Notes (Signed)
Patient: [name] [name] DOB: 1951/02/14 Date of Admission: 01/16/2012            Pulmonary consult  Date of Consult: 01/22/2012 MD requesting consult:  Myrtis Ser Reason for consult: pulm htn, surgical clearance  Subj-  Events of PM 4/4 noted.  THis am pt in CCU on amiodarone drip.  Now in sinus tach.    Filed Vitals:   01/22/12 0600 01/22/12 0700 01/22/12 0753 01/22/12 0800  BP: 111/67 106/54  108/68  Pulse: 100  103 104  Temp:   97.8 F (36.6 C)   TempSrc:   Oral   Resp: 23  18 22   Height:      Weight:      SpO2: 96%  94% 92%      PFTs combined severe restriction /obstruction with low DLCO c/w emphysema   CBC    Component Value Date/Time   WBC 8.7 01/21/2012 0515   WBC 5.2 02/19/2011 1518   RBC 4.32 01/21/2012 0515   RBC 4.25 02/19/2011 1518   HGB 13.9 01/21/2012 0515   HGB 14.1 02/19/2011 1518   HCT 40.5 01/21/2012 0515   HCT 40.3 02/19/2011 1518   PLT 263 01/21/2012 0515   PLT 216 02/19/2011 1518   MCV 93.8 01/21/2012 0515   MCV 94.8 02/19/2011 1518   MCH 32.2 01/21/2012 0515   MCH 33.2 02/19/2011 1518   MCHC 34.3 01/21/2012 0515   MCHC 35.0 02/19/2011 1518   RDW 13.7 01/21/2012 0515   RDW 12.5 02/19/2011 1518   LYMPHSABS 1.6 01/16/2012 2004   LYMPHSABS 1.3 02/19/2011 1518   MONOABS 1.6* 01/16/2012 2004   MONOABS 0.6 02/19/2011 1518   EOSABS 0.0 01/16/2012 2004   EOSABS 0.1 02/19/2011 1518   BASOSABS 0.0 01/16/2012 2004   BASOSABS 0.0 02/19/2011 1518     BMET    Component Value Date/Time   NA 135 01/22/2012 0700   K 3.7 01/22/2012 0700   CL 99 01/22/2012 0700   CO2 26 01/22/2012 0700   GLUCOSE 123* 01/22/2012 0700   BUN 20 01/22/2012 0700   CREATININE 0.64 01/22/2012 0700   CALCIUM 9.1 01/22/2012 0700   GFRNONAA >90 01/22/2012 0700   GFRAA >90 01/22/2012 0700   Ct Angio Chest W/cm &/or Wo Cm  01/21/2012  *RADIOLOGY REPORT*  Clinical Data:  Shortness of breath.  Rule out thoracic aortic aneurysm.  Rule out aorto occlusive disease.  CT ANGIOGRAPHY CHEST, ABDOMEN AND PELVIS  Technique:  Multidetector CT imaging  through the chest, abdomen and pelvis was performed using the standard protocol during bolus administration of intravenous contrast.  Multiplanar reconstructed images including MIPs were obtained and reviewed to evaluate the vascular anatomy.  Contrast:  100 ml Omnipaque 300 IV.  Comparison:  None.  CTA CHEST  Findings:  There is tortuosity of the thoracic aorta which is nonaneurysmal.  Largest diameter is in the ascending aorta measuring 3.0 cm.  Aortic arch measures 2.4 cm.  Proximal descending thoracic aorta measures 2.3 cm.  Descending aorta at the aortic hiatus measures 2.1 cm.  Minimal calcified plaque in the aortic arch and distal descending thoracic aorta.  There are large bilateral pleural effusions.  Compressive atelectasis in the lower lobes bilaterally.  Heart is upper limits normal in size.  There are ground-glass opacities within the lungs bilaterally which could reflect edema or bronchiolitis.  Mildly enlarged pretracheal lymph node measures 13 mm in short axis diameter.  No hilar or axillary adenopathy.  No acute bony abnormality. Calcifications noted in  the proximal left anterior descending coronary artery.   Review of the MIP images confirms the above findings.  IMPRESSION: No evidence of aortic aneurysm or dissection.  Large bilateral pleural effusions.  Ground-glass opacities within the lungs could reflect edema or bronchiolitis/alveolitis.  Heart is borderline in size.  Coronary artery calcifications.  CTA ABDOMEN AND PELVIS  Findings:  No evidence of aortic aneurysm or dissection.  Scattered atherosclerotic calcifications in the infrarenal aorta and common iliac arteries.  Iliofemoral vessels are patent without stenosis. Celiac, superior mesenteric artery, inferior mesenteric artery and single renal arteries bilaterally are patent without focal stenosis.  Liver, spleen, pancreas, adrenals and kidneys are unremarkable.  Small amount of free fluid in the pelvis.  Uterus and adnexa as well as  urinary bladder grossly unremarkable.  Large and small bowel are grossly unremarkable.  No evidence of obstruction.   Review of the MIP images confirms the above findings.  IMPRESSION: No evidence of aortic aneurysm or dissection. Atherosclerotic change in the distal aorta and common iliac arteries.  No acute findings in the abdomen or pelvis.  Original Report Authenticated By: Cyndie Chime, M.D.   Ct Angio Abd/pel W/ And/or W/o  01/21/2012  *RADIOLOGY REPORT*  Clinical Data:  Shortness of breath.  Rule out thoracic aortic aneurysm.  Rule out aorto occlusive disease.  CT ANGIOGRAPHY CHEST, ABDOMEN AND PELVIS  Technique:  Multidetector CT imaging through the chest, abdomen and pelvis was performed using the standard protocol during bolus administration of intravenous contrast.  Multiplanar reconstructed images including MIPs were obtained and reviewed to evaluate the vascular anatomy.  Contrast:  100 ml Omnipaque 300 IV.  Comparison:  None.  CTA CHEST  Findings:  There is tortuosity of the thoracic aorta which is nonaneurysmal.  Largest diameter is in the ascending aorta measuring 3.0 cm.  Aortic arch measures 2.4 cm.  Proximal descending thoracic aorta measures 2.3 cm.  Descending aorta at the aortic hiatus measures 2.1 cm.  Minimal calcified plaque in the aortic arch and distal descending thoracic aorta.  There are large bilateral pleural effusions.  Compressive atelectasis in the lower lobes bilaterally.  Heart is upper limits normal in size.  There are ground-glass opacities within the lungs bilaterally which could reflect edema or bronchiolitis.  Mildly enlarged pretracheal lymph node measures 13 mm in short axis diameter.  No hilar or axillary adenopathy.  No acute bony abnormality. Calcifications noted in the proximal left anterior descending coronary artery.   Review of the MIP images confirms the above findings.  IMPRESSION: No evidence of aortic aneurysm or dissection.  Large bilateral pleural  effusions.  Ground-glass opacities within the lungs could reflect edema or bronchiolitis/alveolitis.  Heart is borderline in size.  Coronary artery calcifications.  CTA ABDOMEN AND PELVIS  Findings:  No evidence of aortic aneurysm or dissection.  Scattered atherosclerotic calcifications in the infrarenal aorta and common iliac arteries.  Iliofemoral vessels are patent without stenosis. Celiac, superior mesenteric artery, inferior mesenteric artery and single renal arteries bilaterally are patent without focal stenosis.  Liver, spleen, pancreas, adrenals and kidneys are unremarkable.  Small amount of free fluid in the pelvis.  Uterus and adnexa as well as urinary bladder grossly unremarkable.  Large and small bowel are grossly unremarkable.  No evidence of obstruction.   Review of the MIP images confirms the above findings.  IMPRESSION: No evidence of aortic aneurysm or dissection. Atherosclerotic change in the distal aorta and common iliac arteries.  No acute findings in the abdomen or pelvis.  Original Report Authenticated By: Cyndie Chime, M.D.       EXAM: BP 108/68  Pulse 104  Temp(Src) 97.8 F (36.6 C) (Oral)  Resp 22  Ht 5\' 9"  (1.753 m)  Wt 64.4 kg (141 lb 15.6 oz)  BMI 20.97 kg/m2  SpO2 92% Now sinus tach  RH 104 General: thin female, NAD Neuro: awake and alert, MAE, appropriate - does have MS but only manifestation appears to be incontinent bladder CV:  s1s2 rrr, +murmur PULM: resps even non labored on [place], few bibasilar crackles, no wheeze  GI: abd soft Extremities:  Thin, pale, warm and dry    IMPRESSION/ PLAN:  Dyspnea -- multifactorial in setting CHF, severe mitral regurg, pulm HTN and potentially underlying COPD. No bronchospasm.  PFTs showed combined restriction/obstruction  PLAN -  Spiriva daily CHF Rx per cardiology  Pulm HTN - PA pressure=68.  In setting CHF and severe mitral regurg with  Flail posterior  Leaflet  PLAN -  Per cards/ tcts   Mitral regurg - per  cards/ Dr Barry Dienes Plan is for OR next week  CHF - per cards    COPD with primary emphysema Alpha one antitrypsin Normal.  This pt can be cleared for planned MV surgery Plan Start Spiriva daily  PCCM will be available prn this weekend and will be checked next week  Shan Levans, M.D. Beeper  [phone]  Cell  [phone]  If no response or cell goes to voicemail, call beeper [phone]

## 2012-01-22 NOTE — Progress Notes (Signed)
   CARDIOTHORACIC SURGERY PROGRESS NOTE  2 Days Post-Op  S/P Procedure(s) (LRB): LEFT AND RIGHT HEART CATHETERIZATION WITH CORONARY ANGIOGRAM (N/A)  Subjective: Feels okay but dyspneic  Objective: Vital signs in last 24 hours: Temp:  [97.6 F (36.4 C)-98.3 F (36.8 C)] 97.6 F (36.4 C) (04/05 1614) Pulse Rate:  [89-106] 106  (04/05 1614) Cardiac Rhythm:  [-] Normal sinus rhythm (04/05 1200) Resp:  [17-30] 24  (04/05 1614) BP: (86-118)/(48-68) 112/65 mmHg (04/05 1615) SpO2:  [92 %-100 %] 97 % (04/05 1614) FiO2 (%):  [50 %] 50 % (04/05 1200) Weight:  [64.4 kg (141 lb 15.6 oz)] 64.4 kg (141 lb 15.6 oz) (04/05 0500)  Physical Exam:  Rhythm:   sinus  Breath sounds: Diminished at bases  Heart sounds:  RRR with loud murmur  Incisions:  n/a  Abdomen:  soft  Extremities:  warm   Intake/Output from previous day: 04/04 0701 - 04/05 0700 In: 733.6 [P.O.:360; I.V.:373.6] Out: 1150 [Urine:1150] Intake/Output this shift: Total I/O In: 548.8 [P.O.:120; I.V.:424.8; IV Piggyback:4] Out: -   Lab Results:  [facility] 01/21/12 0515  WBC 8.7  HGB 13.9  HCT 40.5  PLT 263   BMET:  Basename 01/22/12 0700 01/21/12 0515  NA 135 138  K 3.7 4.1  CL 99 103  CO2 26 25  GLUCOSE 123* 133*  BUN 20 21  CREATININE 0.64 0.50  CALCIUM 9.1 9.4    CBG (last 3)  No results found for this basename: GLUCAP:3 in the last 72 hours PT/INR:   Basename 01/21/12 0515  LABPROT 14.6  INR 1.12    CXR:  N/A  Assessment/Plan: S/P Procedure(s) (LRB): LEFT AND RIGHT HEART CATHETERIZATION WITH CORONARY ANGIOGRAM (N/A)  [name] [name] remains stable although still dyspneic and she cannot lie flat.  She has fairly large pleural effusions, R>L which could be tapped if her respiratory status deteriorates any.  However, I would consider pushing her diuretics - she's really not being diuresed aggressively.  I will follow up on Monday.  Tentatively for OR Tuesday.  [name] [name] H 01/22/2012 4:53 PM

## 2012-01-22 NOTE — Progress Notes (Signed)
PHARMACY - ANTICOAGULATION CONSULT INITIAL NOTE  Pharmacy Consult for: Heparin  Indication: atrial fibrillation    Patient Data:   Allergies: Allergies  Allergen Reactions  . Erythromycin Nausea And Vomiting    Patient Measurements: Height: 5\' 9"  (175.3 cm) Weight: 140 lb 6.9 oz (63.7 kg) IBW/kg (Calculated) : 66.2  Heparin dosing weight: 64 kg   Vital Signs: Temp:  [97 F (36.1 C)-98.1 F (36.7 C)] 98 F (36.7 C) (04/05 0351) Pulse Rate:  [16-185] 93  (04/05 0500) Resp:  [17-30] 22  (04/05 0500) BP: (69-127)/(32-85) 107/63 mmHg (04/05 0500) SpO2:  [93 %-98 %] 97 % (04/05 0500) Weight:  [140 lb 6.9 oz (63.7 kg)] 140 lb 6.9 oz (63.7 kg) (04/04 1100)  Intake/Output from previous day:  Intake/Output Summary (Last 24 hours) at 01/22/12 0624 Last data filed at 01/22/12 0500  Gross per 24 hour  Intake  706.9 ml  Output   1150 ml  Net -443.1 ml    Labs:  Basename 01/21/12 0515 01/20/12 0505  HGB 13.9 --  HCT 40.5 --  PLT 263 --  APTT 29 --  LABPROT 14.6 13.6  INR 1.12 1.02  HEPARINUNFRC -- --  CREATININE 0.50 0.62  CKTOTAL -- --  CKMB -- --  TROPONINI -- --   Estimated Creatinine Clearance: 73.3 ml/min (by C-G formula based on Cr of 0.5).  Medical History: Past Medical History  Diagnosis Date  . Multiple sclerosis   . Breast cancer   . Ovarian cyst   . Hypothyroidism   . Mitral valve regurgitation   . CHF (congestive heart failure)   . Shortness of breath   . Multiple sclerosis   . Neuromuscular disorder     ms  . Arthritis     knees    Scheduled medications:     . adenosine (ADENOCARD) IV  6 mg Intravenous Once  . adenosine      . albuterol  2.5 mg Nebulization Once  . amiodarone  150 mg Intravenous Once  . furosemide  40 mg Intravenous Once  . furosemide  40 mg Intravenous Daily  . lisinopril  2.5 mg Oral Daily  . LORazepam  0.5 mg Oral Once   Or  . LORazepam  0.5 mg Intramuscular Once  . potassium chloride  10 mEq Oral Daily  .  thyroid  60 mg Oral QAC breakfast  . tiotropium  18 mcg Inhalation Daily  . DISCONTD: amiodarone  200 mg Oral BID     Assessment:  61 y.o. female with atrial fibrillation. Pharmacy consulted to manage IV heparin. Plan for mitral valve repair and maze procedure next Tuesday.   Goal of Therapy:  1. Heparin level 0.3-0.7 units/ml  Plan:  1. Heparin IV bolus of 4000 units x 1, then IV heparin infusion at 1050 units/hr.  2. Heparin level in 6 hours.  3. Daily CBC, heparin level.   Dineen Kid Thad Ranger, PharmD 01/22/2012, 6:24 AM

## 2012-01-22 NOTE — Progress Notes (Signed)
ANTICOAGULATION CONSULT NOTE - Follow Up Consult  Pharmacy Consult for Heparin Indication: atrial fibrillation  Allergies  Allergen Reactions  . Erythromycin Nausea And Vomiting    Patient Measurements: Height: 5\' 9"  (175.3 cm) Weight: 141 lb 15.6 oz (64.4 kg) IBW/kg (Calculated) : 66.2  Heparin Dosing Weight: 64 kg  Vital Signs: Temp: 97.8 F (36.6 C) (04/05 2017) Temp src: Oral (04/05 1614) BP: 114/70 mmHg (04/05 1700) Pulse Rate: 87  (04/05 1700)  Labs:  Basename 01/22/12 1955 01/22/12 1357 01/22/12 0700 01/21/12 0515 01/20/12 0505  HGB -- -- -- 13.9 --  HCT -- -- -- 40.5 --  PLT -- -- -- 263 --  APTT -- -- -- 29 --  LABPROT -- -- -- 14.6 13.6  INR -- -- -- 1.12 1.02  HEPARINUNFRC 0.45 0.43 -- -- --  CREATININE -- -- 0.64 0.50 0.62  CKTOTAL -- -- -- -- --  CKMB -- -- -- -- --  TROPONINI -- -- -- -- --   Estimated Creatinine Clearance: 74.1 ml/min (by C-G formula based on Cr of 0.64).   Medications:  Scheduled:     . furosemide  40 mg Intravenous Once  . furosemide  40 mg Intravenous BID  . heparin  4,000 Units Intravenous Once  . potassium chloride  20 mEq Oral Daily  . potassium chloride  40 mEq Oral Once  . thyroid  60 mg Oral QAC breakfast  . tiotropium  18 mcg Inhalation Daily  . DISCONTD: furosemide  40 mg Intravenous Daily  . DISCONTD: lisinopril  2.5 mg Oral Daily  . DISCONTD: potassium chloride  10 mEq Oral Daily   Infusions:     . sodium chloride 10 mL/hr at 01/21/12 2345  . amiodarone (NEXTERONE PREMIX) 360 mg/200 mL dextrose 60 mg/hr (01/22/12 0836)  . heparin 1,050 Units/hr (01/22/12 0800)    Assessment: 61 yo F with new SVT and persistent Afib overnight requiring DCCV.  Currently on amio and heparin infusion.  Heparin level is therapeutic on heparin at 1050 units/hr.  Noted plans for MVR Tuesday.  Goal of Therapy:  Heparin level 0.3-0.7 units/ml   Plan:  Continue heparin at 1050 units/hr. Continue daily heparin level and  CBC.   Caelynn Marshman L. Illene Bolus, PharmD, BCPS Clinical Pharmacist Pager: [phone] 01/22/2012 8:52 PM

## 2012-01-22 NOTE — Progress Notes (Signed)
ANTICOAGULATION CONSULT NOTE - Follow Up Consult  Pharmacy Consult for Heparin Indication: atrial fibrillation  Allergies  Allergen Reactions  . Erythromycin Nausea And Vomiting    Patient Measurements: Height: 5\' 9"  (175.3 cm) Weight: 141 lb 15.6 oz (64.4 kg) IBW/kg (Calculated) : 66.2  Heparin Dosing Weight: 64 kg  Vital Signs: Temp: 98.3 F (36.8 C) (04/05 1137) Temp src: Oral (04/05 1137) BP: 108/67 mmHg (04/05 1200) Pulse Rate: 106  (04/05 1137)  Labs:  Basename 01/22/12 1357 01/22/12 0700 01/21/12 0515 01/20/12 0505  HGB -- -- 13.9 --  HCT -- -- 40.5 --  PLT -- -- 263 --  APTT -- -- 29 --  LABPROT -- -- 14.6 13.6  INR -- -- 1.12 1.02  HEPARINUNFRC 0.43 -- -- --  CREATININE -- 0.64 0.50 0.62  CKTOTAL -- -- -- --  CKMB -- -- -- --  TROPONINI -- -- -- --   Estimated Creatinine Clearance: 74.1 ml/min (by C-G formula based on Cr of 0.64).   Medications:  Scheduled:    . furosemide  40 mg Intravenous Daily  . heparin  4,000 Units Intravenous Once  . potassium chloride  20 mEq Oral Daily  . thyroid  60 mg Oral QAC breakfast  . tiotropium  18 mcg Inhalation Daily  . DISCONTD: lisinopril  2.5 mg Oral Daily  . DISCONTD: potassium chloride  10 mEq Oral Daily   Infusions:    . sodium chloride 10 mL/hr at 01/21/12 2345  . amiodarone (NEXTERONE PREMIX) 360 mg/200 mL dextrose 60 mg/hr (01/22/12 0836)  . heparin 1,050 Units/hr (01/22/12 0800)    Assessment: 61 yo F with new SVT and persistent Afib overnight requiring DCCV.  Currently on amio and heparin infusion.  Heparin level is therapeutic on heparin at 1050 units/hr.  Noted plans for MVR Tuesday.  Goal of Therapy:  Heparin level 0.3-0.7 units/ml   Plan:  Continue heparin at 1050 units/hr. Recheck heparin level this PM to confirm heparin remains therapeutic. Continue daily heparin level and CBC. Continue to monitor for s/sx bleeding.  [employer], Pharm.D., BCPS Clinical Pharmacist Pager  [phone] 01/22/2012 3:26 PM

## 2012-01-23 DIAGNOSIS — I4891 Unspecified atrial fibrillation: Secondary | ICD-10-CM

## 2012-01-23 LAB — BASIC METABOLIC PANEL
Calcium: 9 mg/dL (ref 8.4–10.5)
GFR calc Af Amer: >90 mL/min (ref >90)
GFR calc non Af Amer: >90 mL/min (ref >90)
Potassium: 4.1 mEq/L (ref 3.5–5.1)
Sodium: 136 mEq/L (ref 135–145)

## 2012-01-23 LAB — CBC
Hemoglobin: 12.7 g/dL (ref 12.0–15.0)
MCH: 31.4 pg (ref 26.0–34.0)
MCHC: 33.4 g/dL (ref 30.0–36.0)
Platelets: 250 10*3/uL (ref 150–400)

## 2012-01-23 LAB — HEPARIN LEVEL (UNFRACTIONATED)
Heparin Unfractionated: 0.29 IU/mL — ABNORMAL LOW (ref 0.30–0.70)
Heparin Unfractionated: 0.53 IU/mL (ref 0.30–0.70)

## 2012-01-23 LAB — URINE CULTURE: Colony Count: 35000

## 2012-01-23 LAB — PREALBUMIN: Prealbumin: 13 mg/dL — ABNORMAL LOW (ref 17.0–34.0)

## 2012-01-23 MED ORDER — POTASSIUM CHLORIDE CRYS ER 20 MEQ PO TBCR
40.0000 meq | EXTENDED_RELEASE_TABLET | Freq: Every day | ORAL | Status: DC
  Administered 2012-01-23 – 2012-01-25 (×3): 40 meq via ORAL
  Filled 2012-01-23: qty 2
  Filled 2012-01-23: qty 1
  Filled 2012-01-23 (×2): qty 2

## 2012-01-23 MED ORDER — SODIUM CHLORIDE 0.9 % IV SOLN
INTRAVENOUS | Status: DC
  Administered 2012-01-23: 06:00:00 via INTRAVENOUS

## 2012-01-23 MED ORDER — NESIRITIDE 1.5 MG IV SOLR
0.0050 ug/kg/min | INTRAVENOUS | Status: DC
  Administered 2012-01-23: 0.005 ug/kg/min via INTRAVENOUS
  Filled 2012-01-23: qty 5

## 2012-01-23 MED ORDER — FUROSEMIDE 10 MG/ML IJ SOLN
40.0000 mg | Freq: Three times a day (TID) | INTRAMUSCULAR | Status: DC
  Administered 2012-01-23 – 2012-01-25 (×9): 40 mg via INTRAVENOUS
  Filled 2012-01-23 (×13): qty 4

## 2012-01-23 NOTE — Progress Notes (Addendum)
ANTICOAGULATION CONSULT NOTE - Follow Up Consult  Pharmacy Consult for Heparin Indication: atrial fibrillation  Allergies  Allergen Reactions  . Erythromycin Nausea And Vomiting    Patient Measurements: Height: 5\' 9"  (175.3 cm) Weight: 142 lb 3.2 oz (64.5 kg) IBW/kg (Calculated) : 66.2  Heparin Dosing Weight: 64 kg  Vital Signs: Temp: 98.3 F (36.8 C) (04/06 0358) Temp src: Oral (04/06 0358) BP: 108/80 mmHg (04/06 0700) Pulse Rate: 90  (04/06 0700)  Labs:  Basename 01/23/12 0500 01/22/12 1955 01/22/12 1357 01/22/12 0700 01/21/12 0515  HGB 12.7 -- -- -- 13.9  HCT 38.0 -- -- -- 40.5  PLT 250 -- -- -- 263  APTT -- -- -- -- 29  LABPROT -- -- -- -- 14.6  INR -- -- -- -- 1.12  HEPARINUNFRC 0.29* 0.45 0.43 -- --  CREATININE 0.61 -- -- 0.64 0.50  CKTOTAL -- -- -- -- --  CKMB -- -- -- -- --  TROPONINI -- -- -- -- --   Estimated Creatinine Clearance: 74.2 ml/min (by C-G formula based on Cr of 0.61).   Medications:  Scheduled:     . furosemide  40 mg Intravenous Once  . furosemide  40 mg Intravenous BID  . potassium chloride  20 mEq Oral Daily  . potassium chloride  40 mEq Oral Once  . thyroid  60 mg Oral QAC breakfast  . tiotropium  18 mcg Inhalation Daily  . DISCONTD: furosemide  40 mg Intravenous Daily  . DISCONTD: lisinopril  2.5 mg Oral Daily  . DISCONTD: potassium chloride  10 mEq Oral Daily   Infusions:     . sodium chloride 10 mL/hr at 01/23/12 0551  . sodium chloride 10 mL/hr at 01/23/12 0551  . amiodarone (NEXTERONE PREMIX) 360 mg/200 mL dextrose 30 mg/hr (01/22/12 2253)  . heparin 1,050 Units/hr (01/23/12 0053)    Assessment: 61 yo F with new SVT and persistent Afib overnight requiring DCCV.  Currently on amio and heparin infusion.  Heparin level is subtherapeutic at 0.29 this AM, decreased from yesterday, on heparin at 1050 units/hr. CBC and renal function stable. No line issues noted per RN.  Noted plans for MVR Tuesday.   Goal of Therapy:    Heparin level 0.3-0.7 units/ml   Plan:  Increase heparin to 1150 units/h (=11.5 ml/h) Follow up 6h heparin level and daily CBC.   Maudry Mayhew, PharmD Pgr [phone] 01/23/2012 7:40 AM    Addendum: Heparin level after rate increase to 1150 units/h is therapeutic at 0.53 (goal 0.3-0.7). Continue heparin gtt at the current rate.  Follow up AM heparin level and CBC.  Maudry Mayhew, PharmD Pgr [phone] 01/23/2012 3:20 PM

## 2012-01-23 NOTE — Progress Notes (Signed)
HPI:  Patient is a 61 year old woman with a history of remotely treated breast cancer, hypothyroidism, and a reported history of incidentally noted mitral regurgitation years ago who presented to [facility] on admission with acute shortness of breath.She underwent cardiac cath which showed no coronary disease, 2D Echo demenstrated severe MR, she will be undergoing mitral valve replacement next week.   Antibiotics:   None  Cultures/Sepsis Markers:   None  Access/Protocols:   Best Practice: DVT: Heparin GI: Protonix   Subjective:   Physical Exam: Filed Vitals:   01/23/12 1045  BP: 116/61  Pulse: 113  Temp:   Resp: 31    Intake/Output Summary (Last 24 hours) at 01/23/12 1115 Last data filed at 01/23/12 1000  Gross per 24 hour  Intake 1023.22 ml  Output    800 ml  Net 223.22 ml   Vent Mode:  [-]  FiO2 (%):  [50 %] 50 % GEN no acute distress; thin appearing woman sitting with head of the bed at 45 degrees  NECK supple, JVP is elevated at 12-14 cm H2O  CHEST diffuse bibasillar crackles, no wheezing  CARDIAC regular, S1 S2, holosystolic murmur heard best at apex ABDOMEN soft, non-distended, non-tender, liver non-pulsatile EXT warm, no edema present  NEURO alert & oriented  SKIN warm & dry   Labs: CBC:    Component Value Date/Time   WBC 10.3 01/23/2012 0500   WBC 5.2 02/19/2011 1518   HGB 12.7 01/23/2012 0500   HGB 14.1 02/19/2011 1518   HCT 38.0 01/23/2012 0500   HCT 40.3 02/19/2011 1518   PLT 250 01/23/2012 0500   PLT 216 02/19/2011 1518   MCV 93.8 01/23/2012 0500   MCV 94.8 02/19/2011 1518   NEUTROABS 11.6* 01/16/2012 2004   NEUTROABS 3.1 02/19/2011 1518   LYMPHSABS 1.6 01/16/2012 2004   LYMPHSABS 1.3 02/19/2011 1518   MONOABS 1.6* 01/16/2012 2004   MONOABS 0.6 02/19/2011 1518   EOSABS 0.0 01/16/2012 2004   EOSABS 0.1 02/19/2011 1518   BASOSABS 0.0 01/16/2012 2004   BASOSABS 0.0 02/19/2011 1518     Basic Metabolic Panel:    Component Value Date/Time   NA 136 01/23/2012  0500   K 4.1 01/23/2012 0500   CL 99 01/23/2012 0500   CO2 30 01/23/2012 0500   BUN 21 01/23/2012 0500   CREATININE 0.61 01/23/2012 0500   GLUCOSE 112* 01/23/2012 0500   CALCIUM 9.0 01/23/2012 0500   Comprehensive Metabolic Panel:    Component Value Date/Time   NA 136 01/23/2012 0500   K 4.1 01/23/2012 0500   CL 99 01/23/2012 0500   CO2 30 01/23/2012 0500   BUN 21 01/23/2012 0500   CREATININE 0.61 01/23/2012 0500   GLUCOSE 112* 01/23/2012 0500   CALCIUM 9.0 01/23/2012 0500   AST 47* 01/21/2012 0515   ALT 67* 01/21/2012 0515   ALKPHOS 63 01/21/2012 0515   BILITOT 0.7 01/21/2012 0515   PROT 5.9* 01/21/2012 0515   ALBUMIN 2.5* 01/21/2012 0515    ABG    Component Value Date/Time   PHART 7.479* 01/21/2012 0505   PCO2ART 35.2 01/21/2012 0505   PO2ART 52.4* 01/21/2012 0505   HCO3 26.0* 01/21/2012 0505   TCO2 27.1 01/21/2012 0505   O2SAT 86.6 01/21/2012 0505     Lab 01/17/12 0228  MG 1.8   Lab Results  Component Value Date   CALCIUM 9.0 01/23/2012    Chest Xray: Severe COPD. Bilateral airspace opacities, right greater than left with bilateral  effusions.  Assessment & Plan:  Acute Severe Mitral Valve Regurgitation  Going to surgery next week, continue to optimize volume status with IV lasix, may also consider vasodilators such for bridging until surgery, as vasodilators decreases left ventricular end-diastolic pressure and volume while increasing forward stroke volume and cardiac index. Hydralazine has similar salutary effects. In contrast, the acute effect of angiotensin converting enzyme (ACE) inhibitors or nitrates is usually a decrease in the cardiac index. Therefore D/C Lisinopril.  COPD Continue spiriva, avoid bronchodilators if possible, given risk for tachycardia exacerbation.  AFIB Now NSR after cardioversion, now on amiodarone.   Patient is likely at baseline at this point, little for PCCM to do.  If surgery is undertaken then PCCM will be available to assist but in the meantime, PCCM signing off, please call  back if needed.   Koren Bound, MD [phone]

## 2012-01-23 NOTE — Progress Notes (Signed)
Patient ID: [name] [name], female   DOB: 08-21-1951, 61 y.o.   MRN: [id number]    SUBJECTIVE: Orthopneic and short of breath.  She seems dyspneic talking.  Says this has been stable and has not worsened for the last couple of days.       . furosemide  40 mg Intravenous Once  . furosemide  40 mg Intravenous BID  . potassium chloride  20 mEq Oral Daily  . potassium chloride  40 mEq Oral Once  . thyroid  60 mg Oral QAC breakfast  . tiotropium  18 mcg Inhalation Daily  . DISCONTD: furosemide  40 mg Intravenous Daily  . DISCONTD: lisinopril  2.5 mg Oral Daily  . DISCONTD: potassium chloride  10 mEq Oral Daily      Filed Vitals:   01/23/12 0400 01/23/12 0500 01/23/12 0600 01/23/12 0700  BP: 103/76 109/67 108/77 108/80  Pulse: 94 95 91 90  Temp:      TempSrc:      Resp: 24 25 23 23   Height:      Weight:  142 lb 3.2 oz (64.5 kg)    SpO2: 95% 96% 97% 98%    Intake/Output Summary (Last 24 hours) at 01/23/12 0737 Last data filed at 01/23/12 0700  Gross per 24 hour  Intake 1186.8 ml  Output    800 ml  Net  386.8 ml    LABS: Basic Metabolic Panel:  Basename 01/23/12 0500 01/22/12 0700  NA 136 135  K 4.1 3.7  CL 99 99  CO2 30 26  GLUCOSE 112* 123*  BUN 21 20  CREATININE 0.61 0.64  CALCIUM 9.0 9.1  MG -- --  PHOS -- --   Liver Function Tests:  Basename 01/21/12 0515  AST 47*  ALT 67*  ALKPHOS 63  BILITOT 0.7  PROT 5.9*  ALBUMIN 2.5*   No results found for this basename: LIPASE:2,AMYLASE:2 in the last 72 hours CBC:  Basename 01/23/12 0500 01/21/12 0515  WBC 10.3 8.7  NEUTROABS -- --  HGB 12.7 13.9  HCT 38.0 40.5  MCV 93.8 93.8  PLT 250 263   Cardiac Enzymes: No results found for this basename: CKTOTAL:3,CKMB:3,CKMBINDEX:3,TROPONINI:3 in the last 72 hours BNP: No components found with this basename: POCBNP:3 D-Dimer: No results found for this basename: DDIMER:2 in the last 72 hours Hemoglobin A1C:  Basename 01/21/12 0515  HGBA1C 5.3   Fasting  Lipid Panel: No results found for this basename: CHOL,HDL,LDLCALC,TRIG,CHOLHDL,LDLDIRECT in the last 72 hours Thyroid Function Tests: No results found for this basename: TSH,T4TOTAL,FREET3,T3FREE,THYROIDAB in the last 72 hours Anemia Panel: No results found for this basename: VITAMINB12,FOLATE,FERRITIN,TIBC,IRON,RETICCTPCT in the last 72 hours  RADIOLOGY:  Dg Chest Port 1 View  01/22/2012  *RADIOLOGY REPORT*  Clinical Data: Shortness of breath  PORTABLE CHEST - 1 VIEW  Comparison: Chest x-ray of 01/16/2012  Findings: The lungs are not well aerated and there has been worsening of pulmonary edema with bilateral effusions and cardiomegaly.  Surgical clips overlie the right axilla.  IMPRESSION: Worsening of congestive heart failure with poor aeration and bilateral pleural effusions.  Original Report Authenticated By: Juline Patch, M.D.   Ct Angio Abd/pel W/ And/or W/o  01/21/2012  *RADIOLOGY REPORT*  Clinical Data:  Shortness of breath.  Rule out thoracic aortic aneurysm.  Rule out aorto occlusive disease.  CT ANGIOGRAPHY CHEST, ABDOMEN AND PELVIS  Technique:  Multidetector CT imaging through the chest, abdomen and pelvis was performed using the standard protocol during bolus administration of intravenous  contrast.  Multiplanar reconstructed images including MIPs were obtained and reviewed to evaluate the vascular anatomy.  Contrast:  100 ml Omnipaque 300 IV.  Comparison:  None.  CTA CHEST  Findings:  There is tortuosity of the thoracic aorta which is nonaneurysmal.  Largest diameter is in the ascending aorta measuring 3.0 cm.  Aortic arch measures 2.4 cm.  Proximal descending thoracic aorta measures 2.3 cm.  Descending aorta at the aortic hiatus measures 2.1 cm.  Minimal calcified plaque in the aortic arch and distal descending thoracic aorta.  There are large bilateral pleural effusions.  Compressive atelectasis in the lower lobes bilaterally.  Heart is upper limits normal in size.  There are ground-glass  opacities within the lungs bilaterally which could reflect edema or bronchiolitis.  Mildly enlarged pretracheal lymph node measures 13 mm in short axis diameter.  No hilar or axillary adenopathy.  No acute bony abnormality. Calcifications noted in the proximal left anterior descending coronary artery.   Review of the MIP images confirms the above findings.  IMPRESSION: No evidence of aortic aneurysm or dissection.  Large bilateral pleural effusions.  Ground-glass opacities within the lungs could reflect edema or bronchiolitis/alveolitis.  Heart is borderline in size.  Coronary artery calcifications.  CTA ABDOMEN AND PELVIS  Findings:  No evidence of aortic aneurysm or dissection.  Scattered atherosclerotic calcifications in the infrarenal aorta and common iliac arteries.  Iliofemoral vessels are patent without stenosis. Celiac, superior mesenteric artery, inferior mesenteric artery and single renal arteries bilaterally are patent without focal stenosis.  Liver, spleen, pancreas, adrenals and kidneys are unremarkable.  Small amount of free fluid in the pelvis.  Uterus and adnexa as well as urinary bladder grossly unremarkable.  Large and small bowel are grossly unremarkable.  No evidence of obstruction.   Review of the MIP images confirms the above findings.  IMPRESSION: No evidence of aortic aneurysm or dissection. Atherosclerotic change in the distal aorta and common iliac arteries.  No acute findings in the abdomen or pelvis.  Original Report Authenticated By: Cyndie Chime, M.D.    PHYSICAL EXAM General: NAD Neck: JVP 8-9 cm, no thyromegaly or thyroid nodule.  Lungs: Decreased breath sounds at bases bilaterally CV: Nondisplaced PMI.  Heart regular S1/S2, no S3/S4, 3/6 HSM at apex.  No peripheral edema.  No carotid bruit.  Normal pedal pulses.  Abdomen: Soft, nontender, no hepatosplenomegaly, no distention.  Neurologic: Alert and oriented x 3.  Psych: Normal affect. Extremities: No clubbing or  cyanosis.   TELEMETRY: Reviewed telemetry pt in NSR:  ASSESSMENT AND PLAN:  61 yo with acute severe mitral regurgitation and CHF.  During this hospitalization, she developed atrial fibrillation with RVR and was cardioverted.  1. Mitral regurgitation: Acute severe with flail segment of posterior leaflet (MVP).  Repair planned for Tuesday.  Currently, she is tachypneic and short of breath.  Suspect her symptoms are due to severe MR.  - Add nesiritide at low dose today (0.005 mcg/kg/min w/o bolus) for afterload reduction.  Watch BP carefully.  - Will increase Lasix to q8 hrs to keep I/Os net negative.  2. Atrial fibrillation: Remains in NSR.  Continue heparin gtt/amio gtt today, transition amio to po tomorrow.   Marca Ancona 01/23/2012 7:42 AM

## 2012-01-24 LAB — BASIC METABOLIC PANEL
Calcium: 9 mg/dL (ref 8.4–10.5)
GFR calc Af Amer: >90 mL/min (ref >90)
GFR calc non Af Amer: >90 mL/min (ref >90)
Glucose, Bld: 120 mg/dL — ABNORMAL HIGH (ref 70–99)
Potassium: 3.8 mEq/L (ref 3.5–5.1)
Sodium: 134 mEq/L — ABNORMAL LOW (ref 135–145)

## 2012-01-24 LAB — CBC
Hemoglobin: 12.1 g/dL (ref 12.0–15.0)
MCH: 31.9 pg (ref 26.0–34.0)
MCHC: 34.6 g/dL (ref 30.0–36.0)
Platelets: 265 10*3/uL (ref 150–400)
RDW: 13.9 % (ref 11.5–15.5)

## 2012-01-24 LAB — HEPARIN LEVEL (UNFRACTIONATED): Heparin Unfractionated: 0.6 IU/mL (ref 0.30–0.70)

## 2012-01-24 MED ORDER — POTASSIUM CHLORIDE 20 MEQ PO PACK
40.0000 meq | PACK | Freq: Once | ORAL | Status: AC
  Administered 2012-01-24: 40 meq via ORAL
  Filled 2012-01-24: qty 2

## 2012-01-24 MED ORDER — AMIODARONE HCL 200 MG PO TABS
400.0000 mg | ORAL_TABLET | Freq: Two times a day (BID) | ORAL | Status: DC
  Administered 2012-01-24 – 2012-01-25 (×4): 400 mg via ORAL
  Filled 2012-01-24 (×6): qty 2

## 2012-01-24 MED ORDER — POTASSIUM CHLORIDE 20 MEQ/15ML (10%) PO LIQD
ORAL | Status: AC
  Filled 2012-01-24: qty 30

## 2012-01-24 NOTE — Progress Notes (Signed)
Patient ID: [name] [name], female   DOB: 1951-05-03, 61 y.o.   MRN: [id number]     SUBJECTIVE: Doing much better today.  Started nesiritide yesterday, breathing easier and diuresed well.  I/Os are unrecorded b/c of incontinence but weight is down 2 lbs. She remains in NSR.      . furosemide  40 mg Intravenous Q8H  . potassium chloride  40 mEq Oral Daily  . thyroid  60 mg Oral QAC breakfast  . tiotropium  18 mcg Inhalation Daily  . DISCONTD: furosemide  40 mg Intravenous BID  . DISCONTD: potassium chloride  20 mEq Oral Daily  nesiritide gtt @ 0.005    Filed Vitals:   01/24/12 0400 01/24/12 0500 01/24/12 0600 01/24/12 0727  BP: 93/57 98/74 91/56    Pulse: 83 92 79   Temp:      TempSrc:      Resp: 18 22 16    Height:      Weight:  140 lb 6.9 oz (63.7 kg)    SpO2: 96% 94% 97% 97%    Intake/Output Summary (Last 24 hours) at 01/24/12 0734 Last data filed at 01/24/12 0600  Gross per 24 hour  Intake 1301.22 ml  Output    400 ml  Net 901.22 ml    LABS: Basic Metabolic Panel:  Basename 01/24/12 0455 01/23/12 0500  NA 134* 136  K 3.8 4.1  CL 96 99  CO2 30 30  GLUCOSE 120* 112*  BUN 19 21  CREATININE 0.69 0.61  CALCIUM 9.0 9.0  MG -- --  PHOS -- --   Liver Function Tests: No results found for this basename: AST:2,ALT:2,ALKPHOS:2,BILITOT:2,PROT:2,ALBUMIN:2 in the last 72 hours No results found for this basename: LIPASE:2,AMYLASE:2 in the last 72 hours CBC:  Basename 01/24/12 0455 01/23/12 0500  WBC 10.2 10.3  NEUTROABS -- --  HGB 12.1 12.7  HCT 35.0* 38.0  MCV 92.3 93.8  PLT 265 250   Cardiac Enzymes: No results found for this basename: CKTOTAL:3,CKMB:3,CKMBINDEX:3,TROPONINI:3 in the last 72 hours BNP: No components found with this basename: POCBNP:3 D-Dimer: No results found for this basename: DDIMER:2 in the last 72 hours Hemoglobin A1C: No results found for this basename: HGBA1C in the last 72 hours Fasting Lipid Panel: No results found for this  basename: CHOL,HDL,LDLCALC,TRIG,CHOLHDL,LDLDIRECT in the last 72 hours Thyroid Function Tests: No results found for this basename: TSH,T4TOTAL,FREET3,T3FREE,THYROIDAB in the last 72 hours Anemia Panel: No results found for this basename: VITAMINB12,FOLATE,FERRITIN,TIBC,IRON,RETICCTPCT in the last 72 hours  RADIOLOGY:  Dg Chest Port 1 View  01/22/2012  *RADIOLOGY REPORT*  Clinical Data: Shortness of breath  PORTABLE CHEST - 1 VIEW  Comparison: Chest x-ray of 01/16/2012  Findings: The lungs are not well aerated and there has been worsening of pulmonary edema with bilateral effusions and cardiomegaly.  Surgical clips overlie the right axilla.  IMPRESSION: Worsening of congestive heart failure with poor aeration and bilateral pleural effusions.  Original Report Authenticated By: Juline Patch, M.D.   Ct Angio Abd/pel W/ And/or W/o  01/21/2012  *RADIOLOGY REPORT*  Clinical Data:  Shortness of breath.  Rule out thoracic aortic aneurysm.  Rule out aorto occlusive disease.  CT ANGIOGRAPHY CHEST, ABDOMEN AND PELVIS  Technique:  Multidetector CT imaging through the chest, abdomen and pelvis was performed using the standard protocol during bolus administration of intravenous contrast.  Multiplanar reconstructed images including MIPs were obtained and reviewed to evaluate the vascular anatomy.  Contrast:  100 ml Omnipaque 300 IV.  Comparison:  None.  CTA CHEST  Findings:  There is tortuosity of the thoracic aorta which is nonaneurysmal.  Largest diameter is in the ascending aorta measuring 3.0 cm.  Aortic arch measures 2.4 cm.  Proximal descending thoracic aorta measures 2.3 cm.  Descending aorta at the aortic hiatus measures 2.1 cm.  Minimal calcified plaque in the aortic arch and distal descending thoracic aorta.  There are large bilateral pleural effusions.  Compressive atelectasis in the lower lobes bilaterally.  Heart is upper limits normal in size.  There are ground-glass opacities within the lungs bilaterally  which could reflect edema or bronchiolitis.  Mildly enlarged pretracheal lymph node measures 13 mm in short axis diameter.  No hilar or axillary adenopathy.  No acute bony abnormality. Calcifications noted in the proximal left anterior descending coronary artery.   Review of the MIP images confirms the above findings.  IMPRESSION: No evidence of aortic aneurysm or dissection.  Large bilateral pleural effusions.  Ground-glass opacities within the lungs could reflect edema or bronchiolitis/alveolitis.  Heart is borderline in size.  Coronary artery calcifications.  CTA ABDOMEN AND PELVIS  Findings:  No evidence of aortic aneurysm or dissection.  Scattered atherosclerotic calcifications in the infrarenal aorta and common iliac arteries.  Iliofemoral vessels are patent without stenosis. Celiac, superior mesenteric artery, inferior mesenteric artery and single renal arteries bilaterally are patent without focal stenosis.  Liver, spleen, pancreas, adrenals and kidneys are unremarkable.  Small amount of free fluid in the pelvis.  Uterus and adnexa as well as urinary bladder grossly unremarkable.  Large and small bowel are grossly unremarkable.  No evidence of obstruction.   Review of the MIP images confirms the above findings.  IMPRESSION: No evidence of aortic aneurysm or dissection. Atherosclerotic change in the distal aorta and common iliac arteries.  No acute findings in the abdomen or pelvis.  Original Report Authenticated By: Cyndie Chime, M.D.    PHYSICAL EXAM General: NAD Neck: JVP 7 cm, no thyromegaly or thyroid nodule.  Lungs: Decreased breath sounds at bases bilaterally CV: Nondisplaced PMI.  Heart regular S1/S2, no S3/S4, 3/6 HSM at apex.  No peripheral edema.  No carotid bruit.  Normal pedal pulses.  Abdomen: Soft, nontender, no hepatosplenomegaly, no distention.  Neurologic: Alert and oriented x 3.  Psych: Normal affect. Extremities: No clubbing or cyanosis.   TELEMETRY: Reviewed telemetry pt in  NSR:  ASSESSMENT AND PLAN:  61 yo with acute severe mitral regurgitation and CHF.  During this hospitalization, she developed atrial fibrillation with RVR and was cardioverted.  1. Mitral regurgitation: Acute severe with flail segment of posterior leaflet (MVP).  Repair planned for Tuesday.   - Symptomatically better on low-dose nesiritide.  Continue. - Continue current Lasix dosing, she diuresed well yesterday.  2. Atrial fibrillation: Remains in NSR.  Continue heparin gtt, change amiodarone to po.  Marca Ancona 01/24/2012 7:34 AM

## 2012-01-24 NOTE — Progress Notes (Addendum)
ANTICOAGULATION CONSULT NOTE - Follow Up Consult  Pharmacy Consult for Heparin Indication: atrial fibrillation  Allergies  Allergen Reactions  . Erythromycin Nausea And Vomiting    Patient Measurements: Height: 5\' 9"  (175.3 cm) Weight: 140 lb 6.9 oz (63.7 kg) IBW/kg (Calculated) : 66.2  Heparin Dosing Weight: 64 kg  Vital Signs: Temp: 98 F (36.7 C) (04/07 0744) Temp src: Oral (04/07 0744) BP: 91/56 mmHg (04/07 0600) Pulse Rate: 79  (04/07 0600)  Labs:  Basename 01/24/12 0455 01/23/12 1400 01/23/12 0500 01/22/12 0700  HGB 12.1 -- 12.7 --  HCT 35.0* -- 38.0 --  PLT 265 -- 250 --  APTT -- -- -- --  LABPROT -- -- -- --  INR -- -- -- --  HEPARINUNFRC 0.60 0.53 0.29* --  CREATININE 0.69 -- 0.61 0.64  CKTOTAL -- -- -- --  CKMB -- -- -- --  TROPONINI -- -- -- --   Estimated Creatinine Clearance: 73.3 ml/min (by C-G formula based on Cr of 0.69).   Medications:  Scheduled:     . amiodarone  400 mg Oral BID  . furosemide  40 mg Intravenous Q8H  . potassium chloride  40 mEq Oral Once  . potassium chloride  40 mEq Oral Daily  . thyroid  60 mg Oral QAC breakfast  . tiotropium  18 mcg Inhalation Daily   Infusions:     . sodium chloride 10 mL/hr at 01/23/12 2000  . sodium chloride 10 mL/hr at 01/23/12 2000  . heparin 1,150 Units/hr (01/24/12 0014)  . nesiritide (NATRECOR) infusion 0.005 mcg/kg/min (01/23/12 2000)  . DISCONTD: amiodarone (NEXTERONE PREMIX) 360 mg/200 mL dextrose 30 mg/hr (01/23/12 2312)    Assessment: 61 yo F with new SVT and persistent Afib requiring DCCV during hospitalization currently on heparin infusion.  Heparin level remains within goal range at 0.60 this AM on heparin at 1150 units/hr. CBC and renal function stable. No line issues or bleeding noted.  Noted plans for MVR Tuesday.   Goal of Therapy:  Heparin level 0.3-0.7 units/ml   Plan:  Continue heparin drip at 1150 units/h (=11.5 ml/h) Follow up AM heparin level, CBC, and renal  function.   Maudry Mayhew, PharmD Pgr [phone] 01/24/2012 7:53 AM

## 2012-01-25 DIAGNOSIS — I059 Rheumatic mitral valve disease, unspecified: Secondary | ICD-10-CM

## 2012-01-25 LAB — COMPREHENSIVE METABOLIC PANEL
ALT: 48 U/L — ABNORMAL HIGH (ref 0–35)
AST: 24 U/L (ref 0–37)
Albumin: 2.5 g/dL — ABNORMAL LOW (ref 3.5–5.2)
Calcium: 9.5 mg/dL (ref 8.4–10.5)
Chloride: 94 mEq/L — ABNORMAL LOW (ref 96–112)
Creatinine, Ser: 0.84 mg/dL (ref 0.50–1.10)
Sodium: 133 mEq/L — ABNORMAL LOW (ref 135–145)
Total Bilirubin: 0.6 mg/dL (ref 0.3–1.2)

## 2012-01-25 LAB — CBC
MCV: 93.8 fL (ref 78.0–100.0)
Platelets: 305 10*3/uL (ref 150–400)
RDW: 13.9 % (ref 11.5–15.5)
WBC: 8 10*3/uL (ref 4.0–10.5)

## 2012-01-25 MED ORDER — EPINEPHRINE HCL 1 MG/ML IJ SOLN
0.5000 ug/min | INTRAVENOUS | Status: DC
  Filled 2012-01-25: qty 4

## 2012-01-25 MED ORDER — CHLORHEXIDINE GLUCONATE 4 % EX LIQD
60.0000 mL | Freq: Once | CUTANEOUS | Status: AC
  Administered 2012-01-25: 4 via TOPICAL

## 2012-01-25 MED ORDER — BISACODYL 5 MG PO TBEC
5.0000 mg | DELAYED_RELEASE_TABLET | Freq: Once | ORAL | Status: AC
  Administered 2012-01-25: 5 mg via ORAL
  Filled 2012-01-25: qty 1

## 2012-01-25 MED ORDER — NITROGLYCERIN IN D5W 200-5 MCG/ML-% IV SOLN
2.0000 ug/min | INTRAVENOUS | Status: AC
  Administered 2012-01-26: 5 ug/min via INTRAVENOUS
  Filled 2012-01-25: qty 250

## 2012-01-25 MED ORDER — CHLORHEXIDINE GLUCONATE 4 % EX LIQD: 60.0000 mL | Freq: Once | CUTANEOUS | Status: DC

## 2012-01-25 MED ORDER — DEXTROSE 5 % IV SOLN
30.0000 ug/min | INTRAVENOUS | Status: DC
  Administered 2012-01-26: 10 ug/min via INTRAVENOUS
  Filled 2012-01-25: qty 2

## 2012-01-25 MED ORDER — AMINOCAPROIC ACID 250 MG/ML IV SOLN
INTRAVENOUS | Status: AC
  Administered 2012-01-26: 60 mL/h via INTRAVENOUS
  Filled 2012-01-25 (×2): qty 40

## 2012-01-25 MED ORDER — LORAZEPAM 1 MG PO TABS
1.0000 mg | ORAL_TABLET | Freq: Every day | ORAL | Status: AC
  Administered 2012-01-26: 1 mg via ORAL
  Filled 2012-01-25: qty 1

## 2012-01-25 MED ORDER — DEXTROSE 5 % IV SOLN
750.0000 mg | INTRAVENOUS | Status: DC
  Filled 2012-01-25: qty 750

## 2012-01-25 MED ORDER — VANCOMYCIN HCL 1000 MG IV SOLR
1250.0000 mg | INTRAVENOUS | Status: AC
  Administered 2012-01-26: 1250 mg via INTRAVENOUS
  Filled 2012-01-25: qty 1250

## 2012-01-25 MED ORDER — POTASSIUM CHLORIDE 2 MEQ/ML IV SOLN
80.0000 meq | INTRAVENOUS | Status: DC
  Filled 2012-01-25: qty 40

## 2012-01-25 MED ORDER — TEMAZEPAM 15 MG PO CAPS: 15.0000 mg | ORAL_CAPSULE | Freq: Once | ORAL | Status: AC | PRN

## 2012-01-25 MED ORDER — DEXTROSE 5 % IV SOLN
1.5000 g | INTRAVENOUS | Status: AC
  Administered 2012-01-26: .75 g via INTRAVENOUS
  Administered 2012-01-26: 1.5 g via INTRAVENOUS
  Filled 2012-01-25: qty 1.5

## 2012-01-25 MED ORDER — METOPROLOL TARTRATE 12.5 MG HALF TABLET
12.5000 mg | ORAL_TABLET | Freq: Once | ORAL | Status: AC
  Administered 2012-01-26: 12.5 mg via ORAL
  Filled 2012-01-25: qty 1

## 2012-01-25 MED ORDER — SODIUM CHLORIDE 0.9 % IV SOLN
0.1000 ug/kg/h | INTRAVENOUS | Status: AC
  Administered 2012-01-26: .2 ug/kg/h via INTRAVENOUS
  Filled 2012-01-25: qty 4

## 2012-01-25 MED ORDER — SODIUM CHLORIDE 0.9 % IV SOLN
INTRAVENOUS | Status: AC
  Administered 2012-01-26: 1 [IU]/h via INTRAVENOUS
  Filled 2012-01-25: qty 1

## 2012-01-25 MED ORDER — VERAPAMIL HCL 2.5 MG/ML IV SOLN
INTRAVENOUS | Status: DC
  Filled 2012-01-25: qty 2.5

## 2012-01-25 MED ORDER — MAGNESIUM SULFATE 50 % IJ SOLN
40.0000 meq | INTRAMUSCULAR | Status: DC
  Filled 2012-01-25: qty 10

## 2012-01-25 MED ORDER — CHLORHEXIDINE GLUCONATE 4 % EX LIQD
60.0000 mL | Freq: Once | CUTANEOUS | Status: AC
  Administered 2012-01-25: 4 via TOPICAL
  Filled 2012-01-25: qty 60

## 2012-01-25 MED ORDER — DOPAMINE-DEXTROSE 3.2-5 MG/ML-% IV SOLN
2.0000 ug/kg/min | INTRAVENOUS | Status: DC
  Filled 2012-01-25: qty 250

## 2012-01-25 NOTE — Progress Notes (Signed)
ANTICOAGULATION CONSULT NOTE - Follow Up Consult  Pharmacy Consult for Heparin Indication: atrial fibrillation  Allergies  Allergen Reactions  . Erythromycin Nausea And Vomiting    Patient Measurements: Height: 5\' 9"  (175.3 cm) Weight: 135 lb 12.9 oz (61.6 kg) IBW/kg (Calculated) : 66.2  Heparin Dosing Weight:    Vital Signs: Temp: 98.3 F (36.8 C) (04/08 0818) Temp src: Oral (04/08 0818) BP: 117/76 mmHg (04/08 0800) Pulse Rate: 88  (04/08 0800)  Labs:  Basename 01/25/12 0619 01/24/12 0455 01/23/12 1400 01/23/12 0500  HGB 13.0 12.1 -- --  HCT 38.1 35.0* -- 38.0  PLT 305 265 -- 250  APTT -- -- -- --  LABPROT -- -- -- --  INR -- -- -- --  HEPARINUNFRC 0.62 0.60 0.53 --  CREATININE 0.84 0.69 -- 0.61  CKTOTAL -- -- -- --  CKMB -- -- -- --  TROPONINI -- -- -- --   Estimated Creatinine Clearance: 67.5 ml/min (by C-G formula based on Cr of 0.84).  Assessment: several day history of cough and orthopnea  Anticoagulation: Heparin gtt for new AFib (4/5), HL 0.62 remains therapeutic on 1150 units/h. CBC good.  Cardiovascular: CHF, pulmonary HTN,  hx MR, needs MVR, plan for Tuesady, in the interim developed SVT and persistent AFib requiring DCCV on 4/5. Added low dose nesiritide 4/6 (0.005 mcg/kg/min w/o bolus) for afterload reduction. BP 85/69 to 115/81 with HR 79-101. I/O continue to be +1515 despite diruresis. Meds: IV Lasix, amiodarone po, K, Natrecor (1/2 dose)  Endocrinology: hx hypothyroidism on home [place] thyroid,  Neurology: multiple sclerosis, anxiety/depression  Nephrology: SCr 0.84 (stable). H/o neurogenic bladder and incontinence = foley.  Neuro: h/o anxiety.  Pulmonary: h/o COPD with emphysema. Pulm HTN likely 2/2 HF/mitral regurg, started Spiriva and await to eval pulm fxn after valve repair; noted to be short of breath and dyspneic while talking, tachypneic on 3L [place]  Hematology / Oncology: hx breast cancer (2000); CBC stable:   PTA Medication Issues -  progesterone micronzied not resumed,   Goal of Therapy:  Heparin level 0.3-0.7 units/ml   Plan:  Heparin 1150 units/hr. Plan surgery Tuesday.  [name] [name], PharmD, [facility] Clinical Staff Pharmacist Pager [phone]  01/25/2012,9:05 AM

## 2012-01-25 NOTE — Progress Notes (Addendum)
Patient Name: [name] [name] Date of Encounter: 01/25/2012     SUBJECTIVE: Wants more info on the surgery. Breathing better, no chest pain. Ready for surgery tomorrow.    OBJECTIVE  Filed Vitals:   01/25/12 0401 01/25/12 0500 01/25/12 0600 01/25/12 0700  BP:  96/68 90/55 84/55   Pulse:  100 87 77  Temp: 98.1 F (36.7 C)     TempSrc: Oral     Resp:  19 21 16   Height:      Weight:  135 lb 12.9 oz (61.6 kg)    SpO2:  97% 99% 98%    Intake/Output Summary (Last 24 hours) at 01/25/12 0815 Last data filed at 01/25/12 0800  Gross per 24 hour  Intake 1577.5 ml  Output    100 ml  Net 1477.5 ml   Weight change: -4 lb 10.1 oz (-2.1 kg) Wt Readings from Last 1 Encounters:  01/25/12 135 lb 12.9 oz (61.6 kg)    PHYSICAL EXAM  General: Well developed, slender female, in no acute distress. Head: Normocephalic, atraumatic.  Neck: Supple without bruits, JVD at 10 cm. Lungs:  Resp regular and unlabored, decreased breath sounds bases with rales. Heart: RRR, S1, S2, no S3, S4, 3/6 murmur at apex. Abdomen: Soft, non-tender, non-distended, BS + x 4.  Extremities: No clubbing, cyanosis, no edema.  Neuro: Alert and oriented X 3. Moves all extremities spontaneously. Psych: Normal affect.  LABS: CBC: Basename 01/25/12 0619 01/24/12 0455  WBC 8.0 10.2  NEUTROABS -- --  HGB 13.0 12.1  HCT 38.1 35.0*  MCV 93.8 92.3  PLT 305 265   Basic Metabolic Panel: Basename 01/25/12 0619 01/24/12 0455  NA 133* 134*  K 3.9 3.8  CL 94* 96  CO2 30 30  GLUCOSE 114* 120*  BUN 24* 19  CREATININE 0.84 0.69  CALCIUM 9.5 9.0  MG -- --  PHOS -- --   Liver Function Tests: [facility] 01/25/12 0619  AST 24  ALT 48*  ALKPHOS 58  BILITOT 0.6  PROT 5.7*  ALBUMIN 2.5*   BNP: Pro B Natriuretic peptide (BNP)  Date/Time Value Range Status  01/16/2012  8:04 PM 7570.0* 0-125 (pg/mL) Final   TELE:  SR/ST with occ PVCs   Radiology/Studies: Ct Angio Chest W/cm &/or Wo Cm 01/21/2012  *RADIOLOGY  REPORT*  Clinical Data:  Shortness of breath.  Rule out thoracic aortic aneurysm.  Rule out aorto occlusive disease.  CT ANGIOGRAPHY CHEST, ABDOMEN AND PELVIS  Technique:  Multidetector CT imaging through the chest, abdomen and pelvis was performed using the standard protocol during bolus administration of intravenous contrast.  Multiplanar reconstructed images including MIPs were obtained and reviewed to evaluate the vascular anatomy.  Contrast:  100 ml Omnipaque 300 IV.  Comparison:  None.  CTA CHEST  Findings:  There is tortuosity of the thoracic aorta which is nonaneurysmal.  Largest diameter is in the ascending aorta measuring 3.0 cm.  Aortic arch measures 2.4 cm.  Proximal descending thoracic aorta measures 2.3 cm.  Descending aorta at the aortic hiatus measures 2.1 cm.  Minimal calcified plaque in the aortic arch and distal descending thoracic aorta.  There are large bilateral pleural effusions.  Compressive atelectasis in the lower lobes bilaterally.  Heart is upper limits normal in size.  There are ground-glass opacities within the lungs bilaterally which could reflect edema or bronchiolitis.  Mildly enlarged pretracheal lymph node measures 13 mm in short axis diameter.  No hilar or axillary adenopathy.  No acute bony abnormality. Calcifications  noted in the proximal left anterior descending coronary artery.   Review of the MIP images confirms the above findings.  IMPRESSION: No evidence of aortic aneurysm or dissection.  Large bilateral pleural effusions.  Ground-glass opacities within the lungs could reflect edema or bronchiolitis/alveolitis.  Heart is borderline in size.  Coronary artery calcifications.  CTA ABDOMEN AND PELVIS  Findings:  No evidence of aortic aneurysm or dissection.  Scattered atherosclerotic calcifications in the infrarenal aorta and common iliac arteries.  Iliofemoral vessels are patent without stenosis. Celiac, superior mesenteric artery, inferior mesenteric artery and single renal  arteries bilaterally are patent without focal stenosis.  Liver, spleen, pancreas, adrenals and kidneys are unremarkable.  Small amount of free fluid in the pelvis.  Uterus and adnexa as well as urinary bladder grossly unremarkable.  Large and small bowel are grossly unremarkable.  No evidence of obstruction.   Review of the MIP images confirms the above findings.  IMPRESSION: No evidence of aortic aneurysm or dissection. Atherosclerotic change in the distal aorta and common iliac arteries.  No acute findings in the abdomen or pelvis.  Original Report Authenticated By: Cyndie Chime, M.D.   Dg Chest Port 1 View 01/22/2012  *RADIOLOGY REPORT*  Clinical Data: Shortness of breath  PORTABLE CHEST - 1 VIEW  Comparison: Chest x-ray of 01/16/2012  Findings: The lungs are not well aerated and there has been worsening of pulmonary edema with bilateral effusions and cardiomegaly.  Surgical clips overlie the right axilla.  IMPRESSION: Worsening of congestive heart failure with poor aeration and bilateral pleural effusions.  Original Report Authenticated By: Juline Patch, M.D.    Current Medications:    . sodium chloride 10 mL/hr at 01/23/12 2000  . sodium chloride 10 mL/hr at 01/23/12 2000  . heparin 1,150 Units/hr (01/24/12 2338)  . nesiritide (NATRECOR) infusion 0.005 mcg/kg/min (01/23/12 2000)       . amiodarone  400 mg Oral BID  . furosemide  40 mg Intravenous Q8H  . potassium chloride  40 mEq Oral Once  . potassium chloride      . potassium chloride  40 mEq Oral Daily  . thyroid  60 mg Oral QAC breakfast  . tiotropium  18 mcg Inhalation Daily    ASSESSMENT AND PLAN: Principal Problem:  *MITRAL REGURGITATION - surgery in am  Active Problems:  HYPOTHYROIDISM - TSH wnl at 4.380 but Free T4 slightly low at 0.75 (0.80), MD advise if any med changes needed   Multiple sclerosis - follow   NEUROGENIC BLADDER - will order foley cath   URINARY INCONTINENCE -  will order foley cath   BREAST  CANCER, HX OF - no IVs/blood draws right arm   CHF (congestive heart failure) - continue IV Lasix today, exam and symptoms improved, ?decrease dose   Tachycardia - all sinus, HR stays > 80 most of the time   Pulmonary hypertension - ?2nd MR   Hypoxemia -improved on O2   Hypokalemia - improved with supp   COPD with emphysema - no wheezing now   Atrial fibrillation - maintaining SR on amio bid   Signed, Theodore Demark , PA-C 8:15 AM 01/25/2012  Patient seen and examined. I agree with the assessment and plan as detailed above. See also my additional thoughts below.   I've seen the patient and reviewed all data. I discussed the case this morning with Dr. Shirlee Latch. The patient is on nesiritide, and this has helped. She has diaries. Her weight is down. He I&O is incorrect  as it does not reflect this significant urine output she's had into her diaper because of a neurogenic bladder. This is related to her multiple sclerosis. She is stable. She is ready for her mitral valve surgery tomorrow. Her renal function is stable. Potassium is stable. Rhythm is stable on amiodarone.  Willa Rough, MD, [facility] 01/25/2012 9:17 AM

## 2012-01-25 NOTE — Anesthesia Preprocedure Evaluation (Addendum)
Anesthesia Evaluation  Patient identified by MRN, date of birth, ID band Patient awake    Reviewed: Allergy & Precautions, H&P , NPO status , Patient's Chart, lab work & pertinent test results, reviewed documented beta blocker date and time   Airway Mallampati: II TM Distance: <3 FB Neck ROM: Full    Dental No notable dental hx. (+) Teeth Intact, Caps and Dental Advisory Given   Pulmonary neg pulmonary ROS, shortness of breath and at rest,  breath sounds clear to auscultation  Pulmonary exam normal       Cardiovascular +CHF, + Orthopnea and + DOE negative cardio ROS  + dysrhythmias Atrial Fibrillation + Valvular Problems/Murmurs MR Rhythm:Regular Rate:Normal + Systolic murmurs    Neuro/Psych MS  Neuromuscular disease negative psych ROS   GI/Hepatic negative GI ROS, Neg liver ROS,   Endo/Other  Hypothyroidism   Renal/GU negative Renal ROS  negative genitourinary   Musculoskeletal negative musculoskeletal ROS (+)   Abdominal   Peds negative pediatric ROS (+)  Hematology negative hematology ROS (+)   Anesthesia Other Findings   Reproductive/Obstetrics negative OB ROS                         Anesthesia Physical Anesthesia Plan  ASA: III  Anesthesia Plan: General   Post-op Pain Management:    Induction: Intravenous  Airway Management Planned: Oral ETT and Double Lumen EBT  Additional Equipment: Arterial line, CVP, PA Cath and TEE  Intra-op Plan:   Post-operative Plan: Post-operative intubation/ventilation  Informed Consent: I have reviewed the patients History and Physical, chart, labs and discussed the procedure including the risks, benefits and alternatives for the proposed anesthesia with the patient or authorized representative who has indicated his/her understanding and acceptance.   Dental advisory given  Plan Discussed with: CRNA, Anesthesiologist and Surgeon  Anesthesia Plan  Comments:        Anesthesia Quick Evaluation

## 2012-01-25 NOTE — Progress Notes (Addendum)
   CARDIOTHORACIC SURGERY PROGRESS NOTE  5 Days Post-Op  S/P Procedure(s) (LRB): LEFT AND RIGHT HEART CATHETERIZATION WITH CORONARY ANGIOGRAM (N/A)  Subjective: Breathing improved some over the weekend.  No new complaints.  Objective: Vital signs in last 24 hours: Temp:  [97.4 F (36.3 C)-98.3 F (36.8 C)] 97.9 F (36.6 C) (04/08 1141) Pulse Rate:  [77-100] 91  (04/08 1400) Cardiac Rhythm:  [-] Normal sinus rhythm (04/08 0700) Resp:  [16-26] 20  (04/08 1400) BP: (84-122)/(53-81) 115/72 mmHg (04/08 1400) SpO2:  [93 %-100 %] 97 % (04/08 1400) Weight:  [61.6 kg (135 lb 12.9 oz)] 61.6 kg (135 lb 12.9 oz) (04/08 0500)  Physical Exam:  Rhythm:   sinus  Breath sounds: Diminished at bases  Heart sounds:  RRR with prominent murmur  Incisions:  n/a  Abdomen:  soft  Extremities:  warm   Intake/Output from previous day: 04/07 0701 - 04/08 0700 In: 1614.9 [P.O.:720; I.V.:882.9; IV Piggyback:12] Out: 100 [Urine:100] Intake/Output this shift: Total I/O In: 763.4 [P.O.:480; I.V.:279.4; IV Piggyback:4] Out: 50 [Urine:50]  Lab Results:  [facility] 01/25/12 0619 01/24/12 0455  WBC 8.0 10.2  HGB 13.0 12.1  HCT 38.1 35.0*  PLT 305 265   BMET:  Basename 01/25/12 0619 01/24/12 0455  NA 133* 134*  K 3.9 3.8  CL 94* 96  CO2 30 30  GLUCOSE 114* 120*  BUN 24* 19  CREATININE 0.84 0.69  CALCIUM 9.5 9.0    CBG (last 3)  No results found for this basename: GLUCAP:3 in the last 72 hours PT/INR:  No results found for this basename: LABPROT,INR in the last 72 hours  CXR:  N/A  Assessment/Plan: S/P Procedure(s) (LRB): LEFT AND RIGHT HEART CATHETERIZATION WITH CORONARY ANGIOGRAM (N/A)  I have again reviewed the indications, risks and potential benefits of surgery with [name] [name].  Expectations for her postoperative recovery have been reviewed, and I would predict that her recovery might be somewhat slow even in the absence of complications because of her pre-existing severe class IV  CHF and her somewhat frail condition with protein-depleted malnutrition.  All of her questions have been addressed.  In the event that her mitral valve cannot be repaired satisfactorily, she specifically requests that we replace it using a mechanical prosthesis.  For OR in am.  Purcell Nails 01/25/2012 4:54 PM

## 2012-01-25 NOTE — Progress Notes (Signed)
I received a referral; from the chaplain - Terri to visit with this patient who is having open heart surgery tomorrow morning 6:00.  There are no local family members but she has been in contact with several friends and her brother and sister who are out of state.  She is experiencing a high level of anxiety and request chaplains presence in the morning prior to her surgery.  She is afraid and wants prayer. Will follow up and be there for her in the morning.   01/25/12 2000  Clinical Encounter Type  Visited With Patient;Health care provider  Visit Type Spiritual support;Pre-op  Referral From Chaplain  Spiritual Encounters  Spiritual Needs Emotional  Stress Factors  Patient Stress Factors Exhausted

## 2012-01-26 ENCOUNTER — Inpatient Hospital Stay (HOSPITAL_COMMUNITY): Payer: Medicare Other | Admitting: Anesthesiology

## 2012-01-26 ENCOUNTER — Encounter (HOSPITAL_COMMUNITY): Payer: Self-pay | Admitting: Thoracic Surgery (Cardiothoracic Vascular Surgery)

## 2012-01-26 ENCOUNTER — Encounter (HOSPITAL_COMMUNITY)
Admission: EM | Disposition: A | Discharge status: Skilled Nursing Facility | Payer: Self-pay | Source: Home / Self Care | Attending: Cardiology

## 2012-01-26 ENCOUNTER — Inpatient Hospital Stay (HOSPITAL_COMMUNITY): Payer: Medicare Other

## 2012-01-26 ENCOUNTER — Encounter (HOSPITAL_COMMUNITY): Payer: Self-pay | Admitting: Anesthesiology

## 2012-01-26 DIAGNOSIS — Z9889 Other specified postprocedural states: Secondary | ICD-10-CM

## 2012-01-26 DIAGNOSIS — I4891 Unspecified atrial fibrillation: Secondary | ICD-10-CM

## 2012-01-26 DIAGNOSIS — I059 Rheumatic mitral valve disease, unspecified: Secondary | ICD-10-CM

## 2012-01-26 DIAGNOSIS — Z8679 Personal history of other diseases of the circulatory system: Secondary | ICD-10-CM

## 2012-01-26 DIAGNOSIS — Z952 Presence of prosthetic heart valve: Secondary | ICD-10-CM

## 2012-01-26 HISTORY — DX: Presence of prosthetic heart valve: Z95.2

## 2012-01-26 HISTORY — PX: MITRAL VALVE REPLACEMENT: SHX147

## 2012-01-26 HISTORY — DX: Personal history of other diseases of the circulatory system: Z86.79

## 2012-01-26 HISTORY — PX: CHEST TUBE INSERTION: SHX231

## 2012-01-26 HISTORY — PX: MAZE: SHX5063

## 2012-01-26 LAB — POCT I-STAT 4, (NA,K, GLUC, HGB,HCT)
Glucose, Bld: 131 mg/dL — ABNORMAL HIGH (ref 70–99)
Glucose, Bld: 155 mg/dL — ABNORMAL HIGH (ref 70–99)
Glucose, Bld: 142 mg/dL — ABNORMAL HIGH (ref 70–99)
Glucose, Bld: 154 mg/dL — ABNORMAL HIGH (ref 70–99)
Glucose, Bld: 164 mg/dL — ABNORMAL HIGH (ref 70–99)
Glucose, Bld: 118 mg/dL — ABNORMAL HIGH (ref 70–99)
Glucose, Bld: 94 mg/dL (ref 70–99)
Glucose, Bld: 184 mg/dL — ABNORMAL HIGH (ref 70–99)
Glucose, Bld: 92 mg/dL (ref 70–99)
HCT: 34 % — ABNORMAL LOW (ref 36.0–46.0)
HCT: 33 % — ABNORMAL LOW (ref 36.0–46.0)
HCT: 20 % — ABNORMAL LOW (ref 36.0–46.0)
HCT: 20 % — ABNORMAL LOW (ref 36.0–46.0)
HCT: 22 % — ABNORMAL LOW (ref 36.0–46.0)
HCT: 21 % — ABNORMAL LOW (ref 36.0–46.0)
HCT: 19 % — ABNORMAL LOW (ref 36.0–46.0)
HCT: 30 % — ABNORMAL LOW (ref 36.0–46.0)
Hemoglobin: 11.6 g/dL — ABNORMAL LOW (ref 12.0–15.0)
Hemoglobin: 11.2 g/dL — ABNORMAL LOW (ref 12.0–15.0)
Hemoglobin: 6.8 g/dL — CL (ref 12.0–15.0)
Hemoglobin: 7.8 g/dL — ABNORMAL LOW (ref 12.0–15.0)
Hemoglobin: 7.1 g/dL — ABNORMAL LOW (ref 12.0–15.0)
Hemoglobin: 6.5 g/dL — CL (ref 12.0–15.0)
Potassium: 4 mEq/L (ref 3.5–5.1)
Potassium: 4 meq/L (ref 3.5–5.1)
Potassium: 3.3 mEq/L — ABNORMAL LOW (ref 3.5–5.1)
Potassium: 4.1 meq/L (ref 3.5–5.1)
Potassium: 3.8 mEq/L (ref 3.5–5.1)
Potassium: 4.9 meq/L (ref 3.5–5.1)
Potassium: 4.4 meq/L (ref 3.5–5.1)
Sodium: 133 mEq/L — ABNORMAL LOW (ref 135–145)
Sodium: 133 meq/L — ABNORMAL LOW (ref 135–145)
Sodium: 129 meq/L — ABNORMAL LOW (ref 135–145)
Sodium: 134 mEq/L — ABNORMAL LOW (ref 135–145)
Sodium: 136 meq/L (ref 135–145)
Sodium: 135 meq/L (ref 135–145)

## 2012-01-26 LAB — POCT I-STAT 3, ART BLOOD GAS (G3+)
Acid-Base Excess: 9 mmol/L — ABNORMAL HIGH (ref 0.0–2.0)
Acid-Base Excess: 9 mmol/L — ABNORMAL HIGH (ref 0.0–2.0)
Bicarbonate: 31.4 mEq/L — ABNORMAL HIGH (ref 20.0–24.0)
Bicarbonate: 30.9 meq/L — ABNORMAL HIGH (ref 20.0–24.0)
Bicarbonate: 27.9 mEq/L — ABNORMAL HIGH (ref 20.0–24.0)
Bicarbonate: 22.4 mEq/L (ref 20.0–24.0)
O2 Saturation: 100 %
O2 Saturation: 100 %
O2 Saturation: 100 %
TCO2: 32 mmol/L (ref 0–100)
TCO2: 24 mmol/L (ref 0–100)
pCO2 arterial: 35.1 mmHg (ref 35.0–45.0)
pCO2 arterial: 29.6 mmHg — ABNORMAL LOW (ref 35.0–45.0)
pCO2 arterial: 41.9 mmHg (ref 35.0–45.0)
pCO2 arterial: 44.2 mmHg (ref 35.0–45.0)
pCO2 arterial: 31 mmHg — ABNORMAL LOW (ref 35.0–45.0)
pH, Arterial: 7.628 (ref 7.350–7.400)
pH, Arterial: 7.431 — ABNORMAL HIGH (ref 7.350–7.400)
pH, Arterial: 7.313 — ABNORMAL LOW (ref 7.350–7.400)
pH, Arterial: 7.464 — ABNORMAL HIGH (ref 7.350–7.400)
pO2, Arterial: 388 mmHg — ABNORMAL HIGH (ref 80.0–100.0)
pO2, Arterial: 344 mmHg — ABNORMAL HIGH (ref 80.0–100.0)
pO2, Arterial: 214 mmHg — ABNORMAL HIGH (ref 80.0–100.0)
pO2, Arterial: 79 mmHg — ABNORMAL LOW (ref 80.0–100.0)

## 2012-01-26 LAB — CBC
HCT: 32.5 % — ABNORMAL LOW (ref 36.0–46.0)
MCH: 32 pg (ref 26.0–34.0)
MCH: 30.4 pg (ref 26.0–34.0)
MCV: 92.6 fL (ref 78.0–100.0)
MCV: 86.7 fL (ref 78.0–100.0)
Platelets: 370 10*3/uL (ref 150–400)
Platelets: 143 10*3/uL — ABNORMAL LOW (ref 150–400)
RDW: 14 % (ref 11.5–15.5)
RDW: 16.8 % — ABNORMAL HIGH (ref 11.5–15.5)

## 2012-01-26 LAB — HEMOGLOBIN A1C: Hgb A1c MFr Bld: 5.2 % (ref <5.7)

## 2012-01-26 LAB — POCT I-STAT 3, VENOUS BLOOD GAS (G3P V)
pCO2, Ven: 31.2 mmHg — ABNORMAL LOW (ref 45.0–50.0)
pH, Ven: 7.454 — ABNORMAL HIGH (ref 7.250–7.300)
pO2, Ven: 79 mmHg — ABNORMAL HIGH (ref 30.0–45.0)

## 2012-01-26 LAB — HEMOGLOBIN AND HEMATOCRIT, BLOOD: HCT: 21.4 % — ABNORMAL LOW (ref 36.0–46.0)

## 2012-01-26 LAB — BASIC METABOLIC PANEL
Calcium: 9.7 mg/dL (ref 8.4–10.5)
Creatinine, Ser: 0.95 mg/dL (ref 0.50–1.10)
GFR calc Af Amer: 73 mL/min — ABNORMAL LOW (ref >90)

## 2012-01-26 SURGERY — REPLACEMENT, MITRAL VALVE, MINIMALLY INVASIVE
Anesthesia: General | Site: Chest | Laterality: Right | Wound class: Clean

## 2012-01-26 MED ORDER — BISACODYL 10 MG RE SUPP: 10.0000 mg | Freq: Every day | RECTAL | Status: DC

## 2012-01-26 MED ORDER — LACTATED RINGERS IV SOLN: 500.0000 mL | Freq: Once | INTRAVENOUS | Status: AC | PRN

## 2012-01-26 MED ORDER — PHENYLEPHRINE HCL 10 MG/ML IJ SOLN
10.0000 mg | INTRAVENOUS | Status: DC | PRN
  Administered 2012-01-26: 1 ug/min via INTRAVENOUS

## 2012-01-26 MED ORDER — AMINOCAPROIC ACID 250 MG/ML IV SOLN
5.0000 g/h | INTRAVENOUS | Status: DC
  Filled 2012-01-26: qty 20

## 2012-01-26 MED ORDER — PROTAMINE SULFATE 10 MG/ML IV SOLN
INTRAVENOUS | Status: DC | PRN
  Administered 2012-01-26: 300 mg via INTRAVENOUS

## 2012-01-26 MED ORDER — LACTATED RINGERS IV SOLN
INTRAVENOUS | Status: DC | PRN
  Administered 2012-01-26 (×2): via INTRAVENOUS

## 2012-01-26 MED ORDER — METOPROLOL TARTRATE 25 MG/10 ML ORAL SUSPENSION
12.5000 mg | Freq: Two times a day (BID) | ORAL | Status: DC
  Filled 2012-01-26 (×5): qty 5

## 2012-01-26 MED ORDER — ALBUMIN HUMAN 5 % IV SOLN
250.0000 mL | INTRAVENOUS | Status: AC | PRN
  Administered 2012-01-26 – 2012-01-27 (×3): 250 mL via INTRAVENOUS
  Filled 2012-01-26: qty 250

## 2012-01-26 MED ORDER — SODIUM CHLORIDE 0.9 % IV SOLN: INTRAVENOUS | Status: DC

## 2012-01-26 MED ORDER — BISACODYL 5 MG PO TBEC
10.0000 mg | DELAYED_RELEASE_TABLET | Freq: Every day | ORAL | Status: DC
  Administered 2012-01-27 – 2012-02-03 (×7): 10 mg via ORAL
  Filled 2012-01-26 (×5): qty 2
  Filled 2012-01-26 (×2): qty 1
  Filled 2012-01-26: qty 2

## 2012-01-26 MED ORDER — LACTATED RINGERS IV SOLN
INTRAVENOUS | Status: DC | PRN
  Administered 2012-01-26 (×2): via INTRAVENOUS

## 2012-01-26 MED ORDER — SODIUM CHLORIDE 0.45 % IV SOLN: INTRAVENOUS | Status: DC

## 2012-01-26 MED ORDER — METOPROLOL TARTRATE 12.5 MG HALF TABLET
12.5000 mg | ORAL_TABLET | Freq: Two times a day (BID) | ORAL | Status: DC
  Administered 2012-01-27: 12.5 mg via ORAL
  Filled 2012-01-26 (×5): qty 1

## 2012-01-26 MED ORDER — ASPIRIN EC 325 MG PO TBEC
325.0000 mg | DELAYED_RELEASE_TABLET | Freq: Every day | ORAL | Status: DC
  Administered 2012-01-27: 325 mg via ORAL
  Filled 2012-01-26 (×2): qty 1

## 2012-01-26 MED ORDER — METOPROLOL TARTRATE 1 MG/ML IV SOLN: 2.5000 mg | INTRAVENOUS | Status: DC | PRN

## 2012-01-26 MED ORDER — VECURONIUM BROMIDE 10 MG IV SOLR
INTRAVENOUS | Status: DC | PRN
  Administered 2012-01-26: 5 mg via INTRAVENOUS
  Administered 2012-01-26: 10 mg via INTRAVENOUS

## 2012-01-26 MED ORDER — ACETAMINOPHEN 500 MG PO TABS
1000.0000 mg | ORAL_TABLET | Freq: Four times a day (QID) | ORAL | Status: AC
  Administered 2012-01-27 – 2012-01-31 (×18): 1000 mg via ORAL
  Filled 2012-01-26 (×19): qty 2

## 2012-01-26 MED ORDER — EPINEPHRINE HCL 1 MG/ML IJ SOLN
0.0000 ug/min | INTRAVENOUS | Status: DC
  Filled 2012-01-26: qty 4

## 2012-01-26 MED ORDER — PHENYLEPHRINE HCL 10 MG/ML IJ SOLN
0.0000 ug/min | INTRAVENOUS | Status: DC
  Filled 2012-01-26: qty 2

## 2012-01-26 MED ORDER — MAGNESIUM SULFATE 40 MG/ML IJ SOLN
4.0000 g | Freq: Once | INTRAMUSCULAR | Status: AC
  Administered 2012-01-26: 4 g via INTRAVENOUS
  Filled 2012-01-26: qty 100

## 2012-01-26 MED ORDER — PROPOFOL 10 MG/ML IV BOLUS
INTRAVENOUS | Status: DC | PRN
  Administered 2012-01-26: 70 mg via INTRAVENOUS

## 2012-01-26 MED ORDER — DOCUSATE SODIUM 100 MG PO CAPS
200.0000 mg | ORAL_CAPSULE | Freq: Every day | ORAL | Status: DC
  Administered 2012-01-27 – 2012-02-03 (×6): 200 mg via ORAL
  Filled 2012-01-26 (×7): qty 2

## 2012-01-26 MED ORDER — ROCURONIUM BROMIDE 100 MG/10ML IV SOLN
INTRAVENOUS | Status: DC | PRN
  Administered 2012-01-26 (×2): 50 mg via INTRAVENOUS

## 2012-01-26 MED ORDER — CHLORHEXIDINE GLUCONATE 4 % EX LIQD
CUTANEOUS | Status: AC
  Administered 2012-01-26: 05:00:00
  Filled 2012-01-26: qty 60

## 2012-01-26 MED ORDER — MORPHINE SULFATE 4 MG/ML IJ SOLN: 2.0000 mg | INTRAMUSCULAR | Status: DC | PRN

## 2012-01-26 MED ORDER — SODIUM CHLORIDE 0.9 % IV SOLN
250.0000 mL | INTRAVENOUS | Status: DC
  Administered 2012-01-27: 250 mL via INTRAVENOUS

## 2012-01-26 MED ORDER — ASPIRIN 81 MG PO CHEW: 324.0000 mg | CHEWABLE_TABLET | Freq: Every day | ORAL | Status: DC

## 2012-01-26 MED ORDER — SODIUM CHLORIDE 0.9 % IJ SOLN: 3.0000 mL | INTRAMUSCULAR | Status: DC | PRN

## 2012-01-26 MED ORDER — POTASSIUM CHLORIDE 10 MEQ/50ML IV SOLN
10.0000 meq | INTRAVENOUS | Status: AC
  Administered 2012-01-26 (×3): 10 meq via INTRAVENOUS

## 2012-01-26 MED ORDER — MIDAZOLAM HCL 2 MG/2ML IJ SOLN: 2.0000 mg | INTRAMUSCULAR | Status: DC | PRN

## 2012-01-26 MED ORDER — HEPARIN SODIUM (PORCINE) 1000 UNIT/ML IJ SOLN
INTRAMUSCULAR | Status: DC | PRN
  Administered 2012-01-26: 35000 [IU] via INTRAVENOUS

## 2012-01-26 MED ORDER — VANCOMYCIN HCL 1000 MG IV SOLR
1000.0000 mg | Freq: Once | INTRAVENOUS | Status: AC
  Administered 2012-01-26: 1000 mg via INTRAVENOUS
  Filled 2012-01-26: qty 1000

## 2012-01-26 MED ORDER — MORPHINE SULFATE 2 MG/ML IJ SOLN: 1.0000 mg | INTRAMUSCULAR | Status: AC | PRN

## 2012-01-26 MED ORDER — ACETAMINOPHEN 650 MG RE SUPP
650.0000 mg | RECTAL | Status: AC
  Administered 2012-01-26: 650 mg via RECTAL

## 2012-01-26 MED ORDER — FENTANYL CITRATE 0.05 MG/ML IJ SOLN
INTRAMUSCULAR | Status: DC | PRN
  Administered 2012-01-26: 100 ug via INTRAVENOUS
  Administered 2012-01-26 (×2): 250 ug via INTRAVENOUS
  Administered 2012-01-26: 100 ug via INTRAVENOUS
  Administered 2012-01-26: 50 ug via INTRAVENOUS
  Administered 2012-01-26: 500 ug via INTRAVENOUS
  Administered 2012-01-26: 150 ug via INTRAVENOUS
  Administered 2012-01-26: 100 ug via INTRAVENOUS

## 2012-01-26 MED ORDER — ALBUMIN HUMAN 5 % IV SOLN
INTRAVENOUS | Status: DC | PRN
  Administered 2012-01-26: 17:00:00 via INTRAVENOUS

## 2012-01-26 MED ORDER — SODIUM CHLORIDE 0.9 % IV SOLN
INTRAVENOUS | Status: DC
  Filled 2012-01-26: qty 1

## 2012-01-26 MED ORDER — SODIUM CHLORIDE 0.9 % IJ SOLN: 3.0000 mL | Freq: Two times a day (BID) | INTRAMUSCULAR | Status: DC

## 2012-01-26 MED ORDER — SODIUM CHLORIDE 0.9 % IR SOLN
Status: DC | PRN
  Administered 2012-01-26: 1000 mL

## 2012-01-26 MED ORDER — DEXTROSE 5 % IV SOLN
1.5000 g | Freq: Two times a day (BID) | INTRAVENOUS | Status: DC
  Administered 2012-01-26 – 2012-01-27 (×3): 1.5 g via INTRAVENOUS
  Filled 2012-01-26 (×4): qty 1.5

## 2012-01-26 MED ORDER — MIDAZOLAM HCL 5 MG/5ML IJ SOLN
INTRAMUSCULAR | Status: DC | PRN
  Administered 2012-01-26: 2 mg via INTRAVENOUS
  Administered 2012-01-26 (×2): 6 mg via INTRAVENOUS
  Administered 2012-01-26: 4 mg via INTRAVENOUS
  Administered 2012-01-26: 2 mg via INTRAVENOUS

## 2012-01-26 MED ORDER — EPINEPHRINE HCL 1 MG/ML IJ SOLN
1000.0000 ug | INTRAVENOUS | Status: DC | PRN
  Administered 2012-01-26: 1.33 ug/min via INTRAVENOUS

## 2012-01-26 MED ORDER — DOPAMINE-DEXTROSE 3.2-5 MG/ML-% IV SOLN
INTRAVENOUS | Status: DC | PRN
  Administered 2012-01-26: 3 ug/kg/min via INTRAVENOUS

## 2012-01-26 MED ORDER — OXYCODONE HCL 5 MG PO TABS
5.0000 mg | ORAL_TABLET | ORAL | Status: DC | PRN
  Administered 2012-01-27 – 2012-01-28 (×2): 5 mg via ORAL
  Administered 2012-01-28: 10 mg via ORAL
  Administered 2012-01-28: 5 mg via ORAL
  Administered 2012-01-29 – 2012-02-02 (×17): 10 mg via ORAL
  Filled 2012-01-26: qty 2
  Filled 2012-01-26: qty 1
  Filled 2012-01-26 (×6): qty 2
  Filled 2012-01-26: qty 1
  Filled 2012-01-26 (×3): qty 2
  Filled 2012-01-26: qty 1
  Filled 2012-01-26 (×8): qty 2

## 2012-01-26 MED ORDER — LACTATED RINGERS IV SOLN: INTRAVENOUS | Status: DC

## 2012-01-26 MED ORDER — ONDANSETRON HCL 4 MG/2ML IJ SOLN: 4.0000 mg | Freq: Four times a day (QID) | INTRAMUSCULAR | Status: DC | PRN

## 2012-01-26 MED ORDER — MILRINONE IN DEXTROSE 200-5 MCG/ML-% IV SOLN
0.3000 ug/kg/min | INTRAVENOUS | Status: DC
  Filled 2012-01-26: qty 100

## 2012-01-26 MED ORDER — NITROGLYCERIN IN D5W 200-5 MCG/ML-% IV SOLN: 0.0000 ug/min | INTRAVENOUS | Status: DC

## 2012-01-26 MED ORDER — 0.9 % SODIUM CHLORIDE (POUR BTL) OPTIME
TOPICAL | Status: DC | PRN
  Administered 2012-01-26: 6000 mL

## 2012-01-26 MED ORDER — FAMOTIDINE IN NACL 20-0.9 MG/50ML-% IV SOLN
20.0000 mg | Freq: Two times a day (BID) | INTRAVENOUS | Status: AC
  Administered 2012-01-26 – 2012-01-27 (×2): 20 mg via INTRAVENOUS
  Filled 2012-01-26: qty 50

## 2012-01-26 MED ORDER — PANTOPRAZOLE SODIUM 40 MG PO TBEC
40.0000 mg | DELAYED_RELEASE_TABLET | Freq: Every day | ORAL | Status: DC
  Administered 2012-01-28 – 2012-02-02 (×6): 40 mg via ORAL
  Filled 2012-01-26 (×8): qty 1

## 2012-01-26 MED ORDER — ACETAMINOPHEN 160 MG/5ML PO SOLN: 650.0000 mg | ORAL | Status: AC

## 2012-01-26 MED ORDER — POTASSIUM CHLORIDE 10 MEQ/50ML IV SOLN
10.0000 meq | INTRAVENOUS | Status: AC | PRN
  Administered 2012-01-26 – 2012-01-27 (×3): 10 meq via INTRAVENOUS

## 2012-01-26 MED ORDER — DOPAMINE-DEXTROSE 3.2-5 MG/ML-% IV SOLN: 0.0000 ug/kg/min | INTRAVENOUS | Status: DC

## 2012-01-26 MED ORDER — SODIUM CHLORIDE 0.9 % IR SOLN
Status: DC | PRN
  Administered 2012-01-26: 3000 mL

## 2012-01-26 MED ORDER — MILRINONE IN DEXTROSE 200-5 MCG/ML-% IV SOLN
INTRAVENOUS | Status: DC | PRN
  Administered 2012-01-26: .3 ug/kg/min via INTRAVENOUS

## 2012-01-26 MED ORDER — SODIUM CHLORIDE 0.9 % IV SOLN
0.1000 ug/kg/h | INTRAVENOUS | Status: DC
  Administered 2012-01-26: 0.5 ug/kg/h via INTRAVENOUS
  Filled 2012-01-26 (×2): qty 2

## 2012-01-26 MED ORDER — ACETAMINOPHEN 160 MG/5ML PO SOLN
975.0000 mg | Freq: Four times a day (QID) | ORAL | Status: AC
  Administered 2012-01-27: 975 mg
  Filled 2012-01-26: qty 40.6

## 2012-01-26 MED ORDER — LACTATED RINGERS IV SOLN
INTRAVENOUS | Status: DC | PRN
  Administered 2012-01-26: 09:00:00 via INTRAVENOUS

## 2012-01-26 MED ORDER — INSULIN REGULAR BOLUS VIA INFUSION
0.0000 [IU] | Freq: Three times a day (TID) | INTRAVENOUS | Status: DC
  Filled 2012-01-26: qty 10

## 2012-01-26 SURGICAL SUPPLY — 127 items
ADAPTER CARDIO PERF ANTE/RETRO (ADAPTER) ×4 IMPLANT
BAG DECANTER FOR FLEXI CONT (MISCELLANEOUS) ×4 IMPLANT
BENZOIN TINCTURE PRP APPL 2/3 (GAUZE/BANDAGES/DRESSINGS) ×4 IMPLANT
BLADE SURG 11 STRL SS (BLADE) ×8 IMPLANT
BOOT SUTURE AID YELLOW STND (SUTURE) ×4 IMPLANT
CANISTER SUCTION 2500CC (MISCELLANEOUS) ×8 IMPLANT
CANNULA FEM VENOUS REMOTE 22FR (CANNULA) ×4 IMPLANT
CANNULA FEMORAL ART 14 SM (MISCELLANEOUS) ×4 IMPLANT
CANNULA GUNDRY RCSP 15FR (MISCELLANEOUS) ×4 IMPLANT
CANNULA OPTISITE PERFUSION 16F (CANNULA) ×4 IMPLANT
CANNULA OPTISITE PERFUSION 18F (CANNULA) IMPLANT
CARDIAC SUCTION (MISCELLANEOUS) ×4 IMPLANT
CARDIOBLATE CARDIAC ABLATION (MISCELLANEOUS)
CATH KIT ON Q 5IN SLV (PAIN MANAGEMENT) IMPLANT
CLOTH BEACON ORANGE TIMEOUT ST (SAFETY) ×4 IMPLANT
CONN ST 1/4X3/8  BEN (MISCELLANEOUS) ×2
CONN ST 1/4X3/8 BEN (MISCELLANEOUS) ×6 IMPLANT
CONT SPEC 4OZ CLIKSEAL STRL BL (MISCELLANEOUS) ×4 IMPLANT
CONT SPEC STER OR (MISCELLANEOUS) ×4 IMPLANT
COVER DRAPE ULTRASOUND 610 023 (DRAPES) ×4 IMPLANT
COVER MAYO STAND STRL (DRAPES) ×4 IMPLANT
COVER SURGICAL LIGHT HANDLE (MISCELLANEOUS) ×8 IMPLANT
CRADLE DONUT ADULT HEAD (MISCELLANEOUS) ×4 IMPLANT
DERMABOND ADVANCED (GAUZE/BANDAGES/DRESSINGS) ×1
DERMABOND ADVANCED .7 DNX12 (GAUZE/BANDAGES/DRESSINGS) ×3 IMPLANT
DEVICE CARDIOBLATE CARDIAC ABL (MISCELLANEOUS) IMPLANT
DEVICE PMI PUNCTURE CLOSURE (MISCELLANEOUS) ×4 IMPLANT
DEVICE TROCAR PUNCTURE CLOSURE (ENDOMECHANICALS) ×4 IMPLANT
DRAIN CHANNEL 28F RND 3/8 FF (WOUND CARE) ×8 IMPLANT
DRAPE BILATERAL SPLIT (DRAPES) ×4 IMPLANT
DRAPE C-ARM 42X72 X-RAY (DRAPES) ×4 IMPLANT
DRAPE CV SPLIT W-CLR ANES SCRN (DRAPES) ×4 IMPLANT
DRAPE INCISE IOBAN 66X45 STRL (DRAPES) ×8 IMPLANT
DRAPE SLUSH MACHINE 52X66 (DRAPES) IMPLANT
DRAPE SLUSH/WARMER DISC (DRAPES) ×4 IMPLANT
DRSG COVADERM 4X8 (GAUZE/BANDAGES/DRESSINGS) ×4 IMPLANT
ELECT BLADE 6.5 EXT (BLADE) ×4 IMPLANT
ELECT REM PT RETURN 9FT ADLT (ELECTROSURGICAL) ×8
ELECTRODE REM PT RTRN 9FT ADLT (ELECTROSURGICAL) ×6 IMPLANT
FEMORAL VENOUS CANN RAP (CANNULA) IMPLANT
GLOVE BIO SURGEON STRL SZ 6 (GLOVE) ×20 IMPLANT
GLOVE BIO SURGEON STRL SZ 6.5 (GLOVE) ×16 IMPLANT
GLOVE BIO SURGEON STRL SZ7.5 (GLOVE) ×12 IMPLANT
GLOVE BIOGEL PI IND STRL 6 (GLOVE) ×9 IMPLANT
GLOVE BIOGEL PI INDICATOR 6 (GLOVE) ×3
GLOVE ORTHO TXT STRL SZ7.5 (GLOVE) ×12 IMPLANT
GOWN STRL NON-REIN LRG LVL3 (GOWN DISPOSABLE) ×36 IMPLANT
GUIDEWIRE ANG ZIPWIRE 038X150 (WIRE) ×4 IMPLANT
INSERT CONFORM CROSS CLAMP 66M (MISCELLANEOUS) IMPLANT
INSERT CONFORM CROSS CLAMP 86M (MISCELLANEOUS) IMPLANT
KIT BASIN OR (CUSTOM PROCEDURE TRAY) ×4 IMPLANT
KIT DILATOR VASC 18G NDL (KITS) ×4 IMPLANT
KIT DRAINAGE VACCUM ASSIST (KITS) ×4 IMPLANT
KIT ROOM TURNOVER OR (KITS) ×4 IMPLANT
KIT SUCTION CATH 14FR (SUCTIONS) ×8 IMPLANT
LEAD PACING MYOCARDI (MISCELLANEOUS) ×4 IMPLANT
LINE VENT (MISCELLANEOUS) ×4 IMPLANT
NEEDLE AORTIC ROOT 14G 7F (CATHETERS) ×4 IMPLANT
NS IRRIG 1000ML POUR BTL (IV SOLUTION) ×12 IMPLANT
PACK OPEN HEART (CUSTOM PROCEDURE TRAY) ×4 IMPLANT
PAD ARMBOARD 7.5X6 YLW CONV (MISCELLANEOUS) ×8 IMPLANT
PAD ELECT DEFIB RADIOL ZOLL (MISCELLANEOUS) ×4 IMPLANT
PAD SHARPS MAGNETIC DISPOSAL (MISCELLANEOUS) ×4 IMPLANT
PATCH CORMATRIX 4CMX7CM (Prosthesis & Implant Heart) ×8 IMPLANT
PROBE CRYO2-ABLATION MALLABLE (MISCELLANEOUS) ×4 IMPLANT
RETRACTOR TRL SOFT TISSUE LG (INSTRUMENTS) IMPLANT
RETRACTOR TRM SOFT TISSUE 7.5 (INSTRUMENTS) ×4 IMPLANT
RING MITRAL MEMO 3D 28MM SMD28 (Prosthesis & Implant Heart) ×4 IMPLANT
SET CANNULATION TOURNIQUET (MISCELLANEOUS) ×4 IMPLANT
SET CARDIOPLEGIA MPS 5001102 (MISCELLANEOUS) ×4 IMPLANT
SET IRRIG TUBING LAPAROSCOPIC (IRRIGATION / IRRIGATOR) ×4 IMPLANT
SOLUTION ANTI FOG 6CC (MISCELLANEOUS) ×4 IMPLANT
SPONGE GAUZE 4X4 12PLY (GAUZE/BANDAGES/DRESSINGS) ×12 IMPLANT
SUCKER WEIGHTED FLEX (MISCELLANEOUS) ×8 IMPLANT
SUT BONE WAX W31G (SUTURE) ×8 IMPLANT
SUT E-PACK MINIMALLY INVASIVE (SUTURE) ×4 IMPLANT
SUT ETHIBOND (SUTURE) ×8 IMPLANT
SUT ETHIBOND 2 0 SH (SUTURE) ×16 IMPLANT
SUT ETHIBOND 2 0 V4 (SUTURE) IMPLANT
SUT ETHIBOND 2 0V4 GREEN (SUTURE) IMPLANT
SUT ETHIBOND 2-0 RB-1 WHT (SUTURE) ×8 IMPLANT
SUT ETHIBOND 4 0 TF (SUTURE) IMPLANT
SUT ETHIBOND 5 0 C 1 30 (SUTURE) IMPLANT
SUT ETHIBOND NAB MH 2-0 36IN (SUTURE) IMPLANT
SUT ETHIBOND X763 2 0 SH 1 (SUTURE) ×8 IMPLANT
SUT GORETEX 6.0 TH-9 30 IN (SUTURE) IMPLANT
SUT GORETEX CV 4 TH 22 36 (SUTURE) ×4 IMPLANT
SUT GORETEX CV-5THC-13 36IN (SUTURE) ×16 IMPLANT
SUT GORETEX CV4 TH-18 (SUTURE) ×16 IMPLANT
SUT GORETEX TH-18 36 INCH (SUTURE) IMPLANT
SUT MNCRL AB 3-0 PS2 18 (SUTURE) IMPLANT
SUT PROLENE 3 0 SH DA (SUTURE) ×20 IMPLANT
SUT PROLENE 3 0 SH1 36 (SUTURE) ×12 IMPLANT
SUT PROLENE 4 0 RB 1 (SUTURE) ×6
SUT PROLENE 4-0 RB1 .5 CRCL 36 (SUTURE) ×18 IMPLANT
SUT PROLENE 5 0 C 1 36 (SUTURE) ×4 IMPLANT
SUT PROLENE 6 0 C 1 30 (SUTURE) ×4 IMPLANT
SUT SILK  1 MH (SUTURE) ×1
SUT SILK 1 MH (SUTURE) ×3 IMPLANT
SUT SILK 1 TIES 10X30 (SUTURE) IMPLANT
SUT SILK 2 0 SH CR/8 (SUTURE) IMPLANT
SUT SILK 2 0 TIES 10X30 (SUTURE) IMPLANT
SUT SILK 2 0SH CR/8 30 (SUTURE) IMPLANT
SUT SILK 3 0 (SUTURE)
SUT SILK 3 0 SH CR/8 (SUTURE) IMPLANT
SUT SILK 3 0SH CR/8 30 (SUTURE) IMPLANT
SUT SILK 3-0 18XBRD TIE 12 (SUTURE) IMPLANT
SUT TEM PAC WIRE 2 0 SH (SUTURE) IMPLANT
SUT VIC AB 2-0 CTX 36 (SUTURE) IMPLANT
SUT VIC AB 2-0 UR6 27 (SUTURE) IMPLANT
SUT VIC AB 3-0 SH 8-18 (SUTURE) ×4 IMPLANT
SUT VICRYL 2 TP 1 (SUTURE) IMPLANT
SYRINGE 10CC LL (SYRINGE) ×4 IMPLANT
SYS ATRICLIP LAA EXCLUSION 45 (CLIP) IMPLANT
SYSTEM SAHARA CHEST DRAIN ATS (WOUND CARE) ×4 IMPLANT
TAPE CLOTH SURG 4X10 WHT LF (GAUZE/BANDAGES/DRESSINGS) ×8 IMPLANT
TOWEL OR 17X24 6PK STRL BLUE (TOWEL DISPOSABLE) ×4 IMPLANT
TOWEL OR 17X26 10 PK STRL BLUE (TOWEL DISPOSABLE) ×4 IMPLANT
TRAY FOLEY IC TEMP SENS 14FR (CATHETERS) ×4 IMPLANT
TROCAR XCEL BLADELESS 5X75MML (TROCAR) ×4 IMPLANT
TROCAR XCEL NON-BLD 11X100MML (ENDOMECHANICALS) ×8 IMPLANT
TUBE SUCT INTRACARD DLP 20F (MISCELLANEOUS) ×4 IMPLANT
TUNNELER SHEATH ON-Q 11GX8 (MISCELLANEOUS) IMPLANT
UNDERPAD 30X30 INCONTINENT (UNDERPADS AND DIAPERS) ×4 IMPLANT
VALVE MITRAL 31MM (Prosthesis & Implant Heart) ×4 IMPLANT
WATER STERILE IRR 1000ML POUR (IV SOLUTION) ×8 IMPLANT
WIRE BENTSON .035X145CM (WIRE) ×4 IMPLANT

## 2012-01-26 NOTE — Preoperative (Signed)
Beta Blockers   Reason not to administer Beta Blockers:Not Applicable 

## 2012-01-26 NOTE — OR Nursing (Signed)
On transport call to SICU @1652 

## 2012-01-26 NOTE — Brief Op Note (Signed)
01/16/2012 - 01/26/2012  6:00 PM  PATIENT:  [name] [name]  61 y.o. 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

## 2012-01-26 NOTE — Progress Notes (Signed)
Received referral from staff chaplain Camelia Eng) who indicated that she had formerly provided chaplain service to pt.and that pt had no family locally. Pt. requested that I come have prayer with her before her  Surgery at 6am. I met with her and had   Offered encouragement ,blessing and Prayer.  Will pass on to unit chaplain  For further support.   01/26/12 0800  Clinical Encounter Type  Visited With Patient;Health care provider  Visit Type Spiritual support;Pre-op  Referral From Chaplain  Spiritual Encounters  Spiritual Needs Prayer;Emotional  Stress Factors  Patient Stress Factors Exhausted;Major life changes;Other (Comment) (patient afraid)

## 2012-01-26 NOTE — Op Note (Addendum)
CARDIOTHORACIC SURGERY OPERATIVE NOTE  Date of Procedure: 01/26/2012  Preoperative Diagnosis:   Severe Mitral Regurgitation  New-onset Persistent Atrial Fibrillation  Class IV Congestive Heart Failure  Bilateral Pleural Effusions  Postoperative Diagnosis: Same  Procedure:    Minimally-Invasive Mitral Valve Replacement  Sorin [employer] Optiform mechanical prosthesis (size 31mm, cat. #[id number], serial #[id number])   Maze Procedure  Complete biatrial lesion set using cryothermy   Placement of Left Chest Tube    Surgeon: Salvatore Decent. 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  Prior to that she reports only occasional symptoms of mild exertional shortness of breath. Last week she developed fairly rapid progression of symptoms of exertional shortness of breath and orthopnea. By this past weekend she had severe resting shortness of breath and a dry cough. She initially presented to the acute care clinic at [facility] where she was noted to be in congestive heart failure. She was admitted to the hospital and symptoms have improved somewhat with diuretic therapy. Transthoracic and transesophageal echocardiograms both demonstrate mitral valve prolapse with severe (4+) mitral regurgitation. Left and right heart  catheterization demonstrates normal coronary artery anatomy with no significant coronary artery disease.  There was normal left ventricular function with moderate pulmonary hypertension.  The patient has been seen in consultation and counseled at length regarding the indications, risks and potential benefits of surgery.  All questions have been answered, and the patient provides full informed consent for the operation as described.   The patient specifically requests that her valve be replaced using a mechanical prosthesis if it cannot be satisfactorily repaired.     DETAILS OF THE OPERATIVE PROCEDURE  The patient is brought to the operating room on the above mentioned date and central monitoring was established by the anesthesia team including placement of Swan-Ganz catheter through the right internal jugular vein.  Previous attempts at placing the Swan-Ganz catheter through both the left internal jugular and left subclavian approach were unsuccessful.  A radial arterial line is placed. The patient is placed in the supine position on the operating table.  Intravenous antibiotics are administered. General endotracheal anesthesia is induced uneventfully. The patient is initially intubated using a dual lumen endotracheal tube.  A Foley catheter is placed.  A skin overlying the left anterolateral chest wall just lateral to the left breast was prepared and draped in a sterile manner.  A 28 French chest tube is placed into the left pleural space in order to drain the pre-existing left pleural effusion. A chest tube was fixed to close suction drainage device.  Baseline transesophageal echocardiogram was performed.  Findings were notable for essentially wide open mitral regurgitation. There is fibroelastic deficiency type degenerative disease of the mitral valve with multiple ruptured primary cords involving almost the entire posterior leaflet. Both the P1 and P2 segments of the posterior leaflet were completely  flail. There was some prolapse of P3  as well here the anterior leaflet was essentially normal. Left ventricular function was normal. The mitral annulus was dilated. The aortic valve was normal. There was moderate tricuspid regurgitation with normal right ventricular function.  A soft roll is placed behind the patient's left scapula and the neck gently extended and turned to the left.   The patient's right neck, chest, abdomen, both groins, and both lower extremities are prepared and draped in a sterile manner. A time out procedure is performed.  A small incision is made in the right inguinal crease and the anterior surface of the right common femoral artery and right common femoral vein are identified.  A right miniature anterolateral thoracotomy incision is performed. The incision is placed just lateral to and superior to the right nipple. The pectoralis major muscle is retracted medially and completely preserved. The right pleural space is entered through the 3rd intercostal space. A soft tissue retractor is placed.  Two 11 mm ports are placed through separate stab incisions inferiorly. The right pleural space is insufflated continuously with carbon dioxide gas through the posterior port during the remainder of the operation.  A pledgeted sutures placed through the dome of the right hemidiaphragm and retracted inferiorly to facilitate exposure.  A longitudinal incision is made in the pericardium 3 cm anterior to the phrenic nerve and silk traction sutures are placed on either side of the incision for exposure.  Pursestring sutures are placed on the anterior surface of the right common femoral vein and right common femoral artery. The right common femoral vein is cannulated with the Seldinger technique and a guidewire is advanced under transesophageal echocardiogram guidance through the right atrium. The femoral vein is cannulated with a long 22 French femoral venous cannula. The right common femoral artery  is cannulated with Seldinger technique and a flexible guidewire is advanced until it can be appreciated intraluminally in the descending thoracic aorta on transesophageal echocardiogram. The femoral artery is cannulated with an 18 French femoral arterial cannula.  Adequate heparinization is verified.      The entire pre-bypass portion of the operation was notable for stable hemodynamics.  Cardiopulmonary bypass was begun.  Vacuum assist venous drainage is utilized. The incision in the pericardium is extended in both directions. Venous drainage and exposure are notably excellent. A retrograde cardioplegia cannula is placed through the right atrium into the coronary sinus using transesophageal echocardiogram guidance.  An antegrade cardioplegia cannula is placed in the ascending aorta.    The Atricure cryothermy system is utilized for all cryothermy ablation lesions for the Cox cryomaze procedure.  The right atrial lateral wall lesions are placed including a longitudinal line along the lateral wall from the superior vena cava to the inferior vena cava.  A second lesion is then placed extending from the midportion of the first lesion in an anterior and inferior direction to reach the acute margin of the heart.  The patient is cooled to 28C systemic temperature.  The aortic cross clamp is applied and cold blood cardioplegia is delivered initially in an antegrade fashion through the aortic root.   The initial cardioplegic arrest is rapid with early diastolic arrest.  Supplemental cardioplegia is given retrograde through the coronary sinus catheter. However, the pressure does not increase appropriately and follow up TEE reveals that the retrograde catheter has become dislodged from the coronary sinus.  The catheter is removed and a 14 French venous cannula placed into the right atrium to supplement venous drainage.  Repeat doses of cardioplegia are administered  intermittently every 20 to 30 minutes throughout the  entire cross clamp portion of the operation through the aortic root in order to maintain completely flat electrocardiogram.  Myocardial protection was felt to be excellent.  A left atriotomy incision was performed through the interatrial groove and extended partially across the back wall of the left atrium after opening the oblique sinus inferiorly.  The mitral valve is exposed using a self-retaining retractor.  The mitral valve was inspected and notable for Fibroelastic deficiency type degenerative disease of the valve with ruptured primary cords to almost all of P1 and the majority of P2. This prolapse without rupture and P3. The anterior leaflet is normal. The P2 segment is somewhat tall. Almost the entire posterior leaflet is without support although there does appear to be some chordal support to P3 into the portion of P2 towards the posterior commissure. Both the anterior and posterior commissures abnormal support as does the entire anterior leaflet. There is no calcification.  The left atrial lesion set of the Cox cryomaze procedure is now performed using 3 minute duration for all cryothermy lesions.  Initially a lesion is placed along the endocardial surface of the left atrium from the caudad apex of the atriotomy incision across the posterior wall of the left atrium onto the posterior mitral annulus.  A mirror image lesion along the epicardial surface is then performed with the probe posterior to the left atrium, crossing over the coronary sinus.  Two lesions are then performed to create a box isolating all of the pulmonary veins from the remainder of the left atrium.  The first lesion is placed from the cephalad apex of the atriotomy incision across the dome of the left atrium to just anterior to the left sided pulmonary veins.  The second lesion completes the box from the caudad apex of the atriotomy incision across the back wall of the left atrium to connect with the previous lesion just anterior to  the left sided pulmonary veins.    Interrupted 2-0 Ethibond horizontal mattress sutures are placed circumferentially around the entire mitral valve annulus. The sutures will ultimately be utilized for ring annuloplasty, and at this juncture there are utilized to suspend the valve symmetrically.  The valve is again carefully examined and a strategy for valve repair is planned. Valve appears performed using a generous quadrangular resection of the portion of the P2 segment towards the anterior commissure as well as approximately 1/3 of the P1 segment. Approximately 30% of P2 was removed. The remaining portions of P1 and P2 are now mobilized off of the posterior annulus to perform sliding leaflet annuloplasty. Repair strategy consists of sliding folding plasty involving P2 utilized the somewhat redundant tissue from P2 to reconstruct the resected portion of P1. A pair of secondary cords that were not enlarged were mobilized off of the undersurface of P2 and translocated to the free margin where there were reattached using CV 5 Gore-Tex suture. It is clear that additional chordal support will be necessary, so prior to reconstruction of the posterior leaflet a pair of CV 4 pledgeted Gore-Tex sutures are placed into the head of both the anterior and posterior papillary muscles respectively. Each of these will be utilized for artificial Gore-Tex cord replacement. A series of interrupted 2-0 Ethibond figure-of-eight compression sutures are now placed in the posterior annulus to shorten the posterior annular circumference. The sliding leaflet plasty is completed with a 2 layer closure of running 4-0 Prolene 3 patch been mobilized portions of P1 and P2  back to the posterior annulus. The intervening vertical deffect between P1 and P2 is now closed using interrupted everting CV 5 Gore-Tex sutures.  The valve is tested with saline and appears to be recently competent even without ring annuloplasty completed. The valve was  sized to accept a 28 mm annuloplasty ring corresponding fairly precisely to the overall dimensions of the anterior leaflet including the linear dimension between the anterior and posterior commissures, the height of the anterior leaflet, and overall surface area of the anterior leaflet.  A Sorin Memo 3-D annuloplasty ring (size 28mm, catalog [id number], serial [id number]) is secured in place uneventfully.  The valve was tested with saline and appeared to be intact. The four artificial Gore-Tex cords were retrieved from the left ventricular chamber.  Each limb was woven through the posterior leaflet beginning by placing the cord from the ventricular to the atrial surface close to the free margin on either side of the repair vertical defect. The Gore-Tex cords were then woven in a diamond-shaped fashion towards the posterior annulus. The ventricle was filled with saline and each of the cords tied while the heart was completely filled to secure the appropriate length.  The valve is again tested with saline and appears to be competent with a broad symmetrical line of coaptation of the anterior and posterior leaflet. There is no residual leak. Rewarming is begun.  The atriotomy was closed using a 2-layer closure of running 3-0 Prolene suture after placing a sump drain across the mitral valve to serve as a left ventricular vent.  One final dose of warm antegrade "hot shot" cardioplegia was administered retrograde through the coronary sinus catheter while all air was evacuated through the aortic root.  The aortic cross clamp was removed after a total cross clamp time of 178 minutes.  Epicardial pacing wires are fixed to the inferior wall of the right ventricule and to the right atrial appendage. The patient is rewarmed to 37C temperature. The left ventricular vent is removed.  The patient is ventilated and flow volumes turndown while the mitral valve repair is inspected using transesophageal echocardiogram. The valve  repair appears intact with no residual leak. The antegrade cardioplegia cannula is now removed. The patient is weaned and disconnected from cardiopulmonary bypass.  The patient's rhythm at separation from bypass was AV paced.  The patient was weaned from bypass on low dose dopamine. Total cardiopulmonary bypass time for the operation was 216 minutes.  Followup transesophageal echocardiogram performed after separation from bypass revealed a well-seated annuloplasty ring in the mitral position with a normal functioning mitral valve. There was moderate (2+) residual mitral regurgitation. The functional anatomy of the residual leak appeared to be do to restriction of the posterior leaflet.  Left ventricular function was unchanged from preoperatively.  The patient was observed for a period of several minutes. Hemodynamics became hyperdynamic and dopamine was discontinued. The mitral regurgitation persisted. Although the mitral regurgitation did not appear severe, it clearly was moderate in severity and worrisome for the possibility that valve repair would not remain durable in the long run.  Cardiopulmonary bypass was resumed. The aortic cross-clamp was replaced. Cold blood cardioplegia was again administered antegrade to the aortic root. Repeat doses of cardioplegia were administered in only about the second cross clamp portion of the operation every 20-30 minutes to maintain completely flat all echocardiogram. The left atriotomy was reopened. The mitral valve was reexamined. The repair appeared unchanged from previously and on saline testing appeared intact. Only with significant distention of  the left ventricle could some restriction of posterior leaflet be demonstrated. The artificial Gore-Tex cords were noticeably relatively loose and clearly not the source of leaflet restriction. The leaflet restriction appeared to be do to the amount of resected tissue as well as the sliding folding leaflet plasty of the  posterior leaflet. Repeat repair would require removing the annuloplasty ring and possibly patch augmentation of the posterior leaflet. Given the duration of the operation at this point valve replacement seemed most prudent course of action.  The existing annuloplasty was removed. The majority of the anterior leaflet of the mitral valve was excised sharply with exception of the free margin of the leaflet with some chordal attachments which were incorporated into the lateral margin of the valve replacement to preserve cortical integrity from both the anterior and the posterior leaflet. The sliding leaflet plasty of the posterior leaflet was taken down in the midline and the artificial Gore-Tex cords removed. The valve was sized to accept a 31 mm can't prosthesis. Mitral valve replacement is performed using interrupted 2-0 Ethibond horizontal mattress pledgeted sutures with pledgets in the supra-annular position. A Sorin [employer] Optiform mechanical prosthetic heart valve (size 31mm, catalog #[id number], serial [id number]) was secured in place uneventfully.  The valve was tested to make sure that both leaflets opened and closed without impingement.   The atriotomy was closed using a 2-layer closure of running 3-0 Prolene suture after placing a sump drain across the mitral valve to serve as a left ventricular vent.  One final dose of warm antegrade "hot shot" cardioplegia was administered retrograde through the coronary sinus catheter while all air was evacuated through the aortic root.  The aortic cross clamp was removed after a total cross clamp time of 100 minutes, such that the total combined crossclamp time was 278 minutes.  The patient is rewarmed to 37C temperature. The left ventricular vent is removed.  The patient is ventilated and flow volumes turndown while the mitral valve repair is inspected using transesophageal echocardiogram. The mechanical valve appears intact and functioning normally. The  antegrade cardioplegia cannula is now removed. The patient is weaned and disconnected from cardiopulmonary bypass.  The patient's rhythm at separation from bypass was AV paced.  The patient was weaned from bypass on low dose dopamine. Total cardiopulmonary bypass time for the operation was 144 minutes for a combined total cardiopulmonary bypass time of 360 minutes.  Transesophageal echocardiogram was performed demonstrating normal functioning mechanical mitral prosthesis with no sign of any perivalvular leak. Left ventricular function is mildly reduced. Right ventricular function is moderately reduced. Low-dose milrinone infusion as added.  The femoral arterial and venous cannulae were removed uneventfully. There was a palpable pulse in the distal right common femoral artery after removal of the cannula. Protamine was administered to reverse the anticoagulation. Single lung ventilation was begun. The atriotomy closure was inspected for hemostasis. The pericardial sac was drained using a 28 French Bard drain placed through the anterior port incision.  The pericardium was closed using a patch of core matrix bovine submucosal tissue patch. The right pleural space is irrigated with saline solution and inspected for hemostasis. The right pleural space was drained using a 28 French Bard drain placed through the posterior port incision. The miniature thoracotomy incision was closed in multiple layers in routine fashion. The right groin incision was inspected for hemostasis and closed in multiple layers in routine fashion.  The post-bypass portion of the operation was notable for stable rhythm and hemodynamics although low dose epinephrine  was added because of her moderate right ventricular dysfunction.  Cardiac outputs increased appropriately.  The patient tolerated the procedure well.  The patient was reintubated using a single lumen endotracheal tube and subsequently transported to the surgical intensive care  unit in stable condition. There were no intraoperative complications. All sponge instrument and needle counts are verified correct at completion of the operation.     Salvatore Decent. Cornelius Moras MD 01/26/2012 7:24 PM

## 2012-01-26 NOTE — Progress Notes (Signed)
[facility] BRIEF SICU PROGRESS NOTE  Day of Surgery  S/P Procedure(s) (LRB): MAZE (N/A) MINIMALLY INVASIVE MITRAL VALVE (MV) REPLACEMENT (Right) CHEST TUBE INSERTION (Left)   Sedated on vent Sinus rhythm - AAI paced - O2 sats 98% on 50% FiO2 Stable hemodynamics on low dose dopamine and milrinone Chest tube output low UOP excellent Labs okay with HgB 11.4  Plan: Continue routine early postop  [name] [name] H 01/26/2012 8:11 PM

## 2012-01-26 NOTE — Anesthesia Postprocedure Evaluation (Signed)
  Anesthesia Post-op Note  Patient: [name] [name]  Procedure(s) Performed: Procedure(s) (LRB): MAZE (N/A) MINIMALLY INVASIVE MITRAL VALVE (MV) REPLACEMENT (Right) CHEST TUBE INSERTION (Left)  Patient Location: SICU  Anesthesia Type: General  Level of Consciousness: sedated  Airway and Oxygen Therapy: Patient remains intubated per anesthesia plan and Patient placed on Ventilator (see vital sign flow sheet for setting)  Post-op Pain: none  Post-op Assessment: Post-op Vital signs reviewed and Patient's Cardiovascular Status Stable  Post-op Vital Signs: stable  Complications: No apparent anesthesia complications

## 2012-01-26 NOTE — Transfer of Care (Signed)
Immediate Anesthesia Transfer of Care Note  Patient: BLONDELL LAPERLE  Procedure(s) Performed: Procedure(s) (LRB): MAZE (N/A) MINIMALLY INVASIVE MITRAL VALVE (MV) REPLACEMENT (Right) CHEST TUBE INSERTION (Left)  Patient Location: PACU and SICU  Anesthesia Type: General  Level of Consciousness: sedated  Airway & Oxygen Therapy: Patient remains intubated per anesthesia plan and Patient placed on Ventilator (see vital sign flow sheet for setting)  Post-op Assessment: Report given to PACU RN and Post -op Vital signs reviewed and stable  Post vital signs: Reviewed and stable  Complications: No apparent anesthesia complications

## 2012-01-26 NOTE — Anesthesia Procedure Notes (Signed)
Procedure Name: Intubation Performed by: Brien Mates DOBSON Pre-anesthesia Checklist: Patient identified, Emergency Drugs available, Suction available and Timeout performed Patient Re-evaluated:Patient Re-evaluated prior to inductionOxygen Delivery Method: Circle system utilized Preoxygenation: Pre-oxygenation with 100% oxygen Intubation Type: IV induction Laryngoscope Size: Mac and 3 Grade View: Grade I Tube type: Oral Endobronchial tube: Double lumen EBT and EBT position confirmed by fiberoptic bronchoscope and 35 Fr Number of attempts: 1 Airway Equipment and Method: Stylet Placement Confirmation: ETT inserted through vocal cords under direct vision,  breath sounds checked- equal and bilateral,  positive ETCO2 and CO2 detector Tube secured with: Tape Dental Injury: Teeth and Oropharynx as per pre-operative assessment

## 2012-01-26 NOTE — OR Nursing (Signed)
2nd call to SICU @1745 Louie Bun

## 2012-01-26 NOTE — Progress Notes (Signed)
  Echocardiogram Echocardiogram Transesophageal has been performed.  [name] [name] 01/26/2012, 9:36 AM

## 2012-01-26 NOTE — Brief Op Note (Deleted)
                   [address]            [place] [address]          [id number]    01/16/2012 - 01/26/2012  2:03 PM  PATIENT:  [name] [name]  61 y.o. female  PRE-OPERATIVE DIAGNOSIS:  mitral regurgitation  POST-OPERATIVE DIAGNOSIS:  mitral regurgitation  PROCEDURE:  Procedure(s): MINIMALLY INVASIVE MITRAL VALVE REPAIR (MVR) with complex valvuloplasty and #28 MEMO 3D RING ANNULOPLASTY MAZE PROCEDURE  SURGEON:  Surgeon(s): Purcell Nails, MD  PHYSICIAN ASSISTANT: Keena Dinse PA-C  ANESTHESIA:   general  PATIENT CONDITION:  TO SICU STABLE AND INTUBATED  PRE-OPERATIVE WEIGHT: 59kg   COMPLICATIONS- NO KNOWN

## 2012-01-27 ENCOUNTER — Encounter (HOSPITAL_COMMUNITY): Payer: Self-pay | Admitting: Thoracic Surgery (Cardiothoracic Vascular Surgery)

## 2012-01-27 ENCOUNTER — Inpatient Hospital Stay (HOSPITAL_COMMUNITY): Payer: Medicare Other

## 2012-01-27 LAB — BASIC METABOLIC PANEL
BUN: 17 mg/dL (ref 6–23)
Chloride: 107 mEq/L (ref 96–112)
Creatinine, Ser: 0.68 mg/dL (ref 0.50–1.10)
GFR calc Af Amer: >90 mL/min (ref >90)
Glucose, Bld: 113 mg/dL — ABNORMAL HIGH (ref 70–99)
Potassium: 4.4 mEq/L (ref 3.5–5.1)

## 2012-01-27 LAB — POCT I-STAT 3, ART BLOOD GAS (G3+)
Acid-base deficit: 2 mmol/L (ref 0.0–2.0)
Acid-base deficit: 3 mmol/L — ABNORMAL HIGH (ref 0.0–2.0)
O2 Saturation: 99 %
Patient temperature: 36.7
TCO2: 24 mmol/L (ref 0–100)
pCO2 arterial: 38.2 mmHg (ref 35.0–45.0)
pH, Arterial: 7.383 (ref 7.350–7.400)
pO2, Arterial: 104 mmHg — ABNORMAL HIGH (ref 80.0–100.0)

## 2012-01-27 LAB — CREATININE, SERUM
Creatinine, Ser: 0.85 mg/dL (ref 0.50–1.10)
GFR calc Af Amer: 83 mL/min — ABNORMAL LOW (ref >90)
GFR calc non Af Amer: 72 mL/min — ABNORMAL LOW (ref >90)

## 2012-01-27 LAB — CBC
HCT: 33.6 % — ABNORMAL LOW (ref 36.0–46.0)
Hemoglobin: 11.8 g/dL — ABNORMAL LOW (ref 12.0–15.0)
MCHC: 35.1 g/dL (ref 30.0–36.0)
MCV: 87.3 fL (ref 78.0–100.0)
MCV: 88.2 fL (ref 78.0–100.0)
Platelets: 171 10*3/uL (ref 150–400)
RBC: 4.24 MIL/uL (ref 3.87–5.11)
RDW: 17.4 % — ABNORMAL HIGH (ref 11.5–15.5)
RDW: 17.8 % — ABNORMAL HIGH (ref 11.5–15.5)
WBC: 15.9 10*3/uL — ABNORMAL HIGH (ref 4.0–10.5)

## 2012-01-27 LAB — GLUCOSE, CAPILLARY
Glucose-Capillary: 109 mg/dL — ABNORMAL HIGH (ref 70–99)
Glucose-Capillary: 110 mg/dL — ABNORMAL HIGH (ref 70–99)
Glucose-Capillary: 95 mg/dL (ref 70–99)
Glucose-Capillary: 138 mg/dL — ABNORMAL HIGH (ref 70–99)
Glucose-Capillary: 166 mg/dL — ABNORMAL HIGH (ref 70–99)
Glucose-Capillary: 154 mg/dL — ABNORMAL HIGH (ref 70–99)

## 2012-01-27 LAB — PREPARE FRESH FROZEN PLASMA: Unit division: 0

## 2012-01-27 LAB — SURGICAL PCR SCREEN
MRSA, PCR: NEGATIVE
Staphylococcus aureus: NEGATIVE

## 2012-01-27 LAB — POCT I-STAT, CHEM 8
Glucose, Bld: 158 mg/dL — ABNORMAL HIGH (ref 70–99)
HCT: 38 % (ref 36.0–46.0)
Hemoglobin: 12.9 g/dL (ref 12.0–15.0)
Potassium: 4.4 mEq/L (ref 3.5–5.1)
Sodium: 137 mEq/L (ref 135–145)
TCO2: 23 mmol/L (ref 0–100)

## 2012-01-27 LAB — PREPARE PLATELET PHERESIS: Unit division: 0

## 2012-01-27 LAB — MAGNESIUM: Magnesium: 2.4 mg/dL (ref 1.5–2.5)

## 2012-01-27 MED ORDER — INSULIN ASPART 100 UNIT/ML ~~LOC~~ SOLN
0.0000 [IU] | SUBCUTANEOUS | Status: AC
  Administered 2012-01-27: 2 [IU] via SUBCUTANEOUS
  Administered 2012-01-27: 4 [IU] via SUBCUTANEOUS

## 2012-01-27 MED ORDER — FUROSEMIDE 10 MG/ML IJ SOLN
20.0000 mg | Freq: Four times a day (QID) | INTRAMUSCULAR | Status: AC
  Administered 2012-01-27 (×3): 20 mg via INTRAVENOUS
  Filled 2012-01-27 (×3): qty 2

## 2012-01-27 MED ORDER — AMIODARONE HCL 200 MG PO TABS: 200.0000 mg | ORAL_TABLET | Freq: Two times a day (BID) | ORAL | Status: DC

## 2012-01-27 MED ORDER — WARFARIN - PHYSICIAN DOSING INPATIENT
Freq: Every day | Status: DC
  Administered 2012-01-28: 17:00:00

## 2012-01-27 MED ORDER — AMIODARONE HCL IN DEXTROSE 360-4.14 MG/200ML-% IV SOLN
30.0000 mg/h | INTRAVENOUS | Status: DC
  Administered 2012-01-27 (×2): 30 mg/h via INTRAVENOUS
  Filled 2012-01-27 (×4): qty 200

## 2012-01-27 MED ORDER — MILRINONE IN DEXTROSE 200-5 MCG/ML-% IV SOLN
0.0000 ug/kg/min | INTRAVENOUS | Status: DC
  Filled 2012-01-27: qty 100

## 2012-01-27 MED ORDER — WARFARIN SODIUM 2.5 MG PO TABS
2.5000 mg | ORAL_TABLET | Freq: Every day | ORAL | Status: DC
  Administered 2012-01-27 – 2012-02-02 (×7): 2.5 mg via ORAL
  Filled 2012-01-27 (×8): qty 1

## 2012-01-27 MED ORDER — INSULIN ASPART 100 UNIT/ML ~~LOC~~ SOLN
0.0000 [IU] | SUBCUTANEOUS | Status: DC
  Administered 2012-01-27: 2 [IU] via SUBCUTANEOUS

## 2012-01-27 MED ORDER — INSULIN ASPART 100 UNIT/ML ~~LOC~~ SOLN
0.0000 [IU] | SUBCUTANEOUS | Status: DC
  Administered 2012-01-27: 2 [IU] via SUBCUTANEOUS
  Administered 2012-01-27 (×2): 4 [IU] via SUBCUTANEOUS
  Administered 2012-01-28 (×2): 2 [IU] via SUBCUTANEOUS

## 2012-01-27 MED ORDER — AMIODARONE HCL 200 MG PO TABS
200.0000 mg | ORAL_TABLET | Freq: Two times a day (BID) | ORAL | Status: DC
  Administered 2012-01-28 – 2012-02-03 (×13): 200 mg via ORAL
  Filled 2012-01-27 (×14): qty 1

## 2012-01-27 MED FILL — Verapamil HCl IV Soln 2.5 MG/ML: INTRAVENOUS | Qty: 4 | Status: AC

## 2012-01-27 MED FILL — Lactated Ringer's Solution: INTRAVENOUS | Qty: 500 | Status: AC

## 2012-01-27 MED FILL — Potassium Chloride Inj 2 mEq/ML: INTRAVENOUS | Qty: 40 | Status: AC

## 2012-01-27 MED FILL — Heparin Sodium (Porcine) Inj 1000 Unit/ML: INTRAMUSCULAR | Qty: 10 | Status: AC

## 2012-01-27 MED FILL — Magnesium Sulfate Inj 50%: INTRAMUSCULAR | Qty: 10 | Status: AC

## 2012-01-27 MED FILL — Nitroglycerin IV Soln 5 MG/ML: INTRAVENOUS | Qty: 10 | Status: AC

## 2012-01-27 NOTE — Progress Notes (Addendum)
Advanced Heart Failure Rounding Note   Subjective:    POD 1 Maze procedure and MVR by Dr. Cornelius Moras.  Weaning milrinone today.  Amiodarone gtt on.    Feels pretty good.  Breathing improved.  Pain with movement otherwise ok.    Has significant drainage from chest tubes.   Objective:    Vital Signs:   Temp:  [94.1 F (34.5 C)-98.8 F (37.1 C)] 97.5 F (36.4 C) (04/10 1547) Pulse Rate:  [66-96] 66  (04/10 1600) Resp:  [0-27] 23  (04/10 1600) BP: (84-136)/(52-85) 114/71 mmHg (04/10 1600) SpO2:  [96 %-100 %] 99 % (04/10 1600) Arterial Line BP: (84-177)/(48-84) 153/72 mmHg (04/10 0945) FiO2 (%):  [39.8 %-50.3 %] 39.8 % (04/10 0500) Weight:  [155 lb 13.8 oz (70.7 kg)] 155 lb 13.8 oz (70.7 kg) (04/10 0600) Last BM Date: 01/27/12  Weight change: Filed Weights   01/25/12 0500 01/26/12 0500 01/27/12 0600  Weight: 135 lb 12.9 oz (61.6 kg) 131 lb 9.8 oz (59.7 kg) 155 lb 13.8 oz (70.7 kg)    Intake/Output:   Intake/Output Summary (Last 24 hours) at 01/27/12 1723 Last data filed at 01/27/12 1600  Gross per 24 hour  Intake 6987.42 ml  Output   4616 ml  Net 2371.42 ml     Physical Exam: General:  Ill appearing. No resp difficulty HEENT: normal Neck: supple. JVP 10-12 . Carotids 2+ bilat; no bruits. No lymphadenopathy or thryomegaly appreciated. Cor: PMI nondisplaced. Regular rate & rhythm. Mechanical click. Lungs: decreased BS at bases.  Chest tube in place Abdomen: soft, nontender, nondistended. No hepatosplenomegaly. No bruits or masses. Good bowel sounds. Extremities: no cyanosis, clubbing, rash, edema, SCDs in place  Neuro: alert & orientedx3, cranial nerves grossly intact. moves all 4 extremities w/o difficulty. Affect pleasant  Telemetry: NSR 80s long 1aVB  Labs: Basic Metabolic Panel:  Lab 01/27/12 4098 01/27/12 1627 01/27/12 0223 01/26/12 1908 01/26/12 1713 01/26/12 1541 01/26/12 0510 01/25/12 0619 01/24/12 0455 01/23/12 0500  NA -- 137 139 141 140 135 -- -- -- --   K -- 4.4 4.4 3.4* 4.4 4.4 -- -- -- --  CL -- 104 107 -- -- -- 91* 94* 96 --  CO2 -- -- 24 -- -- -- 29 30 30 30   GLUCOSE -- 158* 113* 92 184* 94 -- -- -- --  BUN -- 22 17 -- -- -- 25* 24* 19 --  CREATININE 0.85 1.00 0.68 -- -- -- 0.95 0.84 -- --  CALCIUM -- -- 8.7 -- -- -- 9.7 9.5 -- --  MG 2.4 -- 3.1* -- -- -- -- -- -- --  PHOS -- -- -- -- -- -- -- -- -- --    Liver Function Tests:  Lab 01/25/12 0619 01/21/12 0515  AST 24 47*  ALT 48* 67*  ALKPHOS 58 63  BILITOT 0.6 0.7  PROT 5.7* 5.9*  ALBUMIN 2.5* 2.5*   No results found for this basename: LIPASE:5,AMYLASE:5 in the last 168 hours No results found for this basename: AMMONIA:3 in the last 168 hours  CBC:  Lab 01/27/12 1630 01/27/12 1627 01/27/12 0223 01/26/12 1908 01/26/12 1906 01/26/12 1339 01/26/12 0510 01/25/12 0619  WBC 15.9* -- 13.7* -- 15.6* -- 10.9* 8.0  NEUTROABS -- -- -- -- -- -- -- --  HGB 12.8 12.9 11.8* 10.2* 11.4* -- -- --  HCT 37.4 38.0 33.6* 30.0* 32.5* -- -- --  MCV 88.2 -- 87.3 -- 86.7 -- 92.6 93.8  PLT 171 -- 183 -- 143* 158  370 --    Cardiac Enzymes: No results found for this basename: CKTOTAL:5,CKMB:5,CKMBINDEX:5,TROPONINI:5 in the last 168 hours  BNP: BNP (last 3 results)  Basename 01/16/12 2004  PROBNP 7570.0*     Other results:  Imaging: Dg Chest Portable 1 View In Am  01/27/2012  *RADIOLOGY REPORT*  Clinical Data: Postop heart surgery  PORTABLE CHEST - 1 VIEW  Comparison: Chest radiograph 01/26/2012  Findings: Interval extubation with no increase in atelectasis. There is some improvement in the air space opacity in the right mid lung.  Swan-Ganz catheter, mediastinal drain, and left chest tube are unchanged.  There is interval removal of NG tube.  Additionally there is a right chest tube.  IMPRESSION:  1.  Interval extubation without complication. 2.  Improved aeration in the right mid lung. 3.  Stable remaining support apparatus of pneumothorax.  Original Report Authenticated By: Genevive Bi, M.D.   Dg Chest Portable 1 View  01/26/2012  *RADIOLOGY REPORT*  Clinical Data: Mitral valve replacement procedure.  PORTABLE CHEST - 1 VIEW  Comparison: 01/22/2012  Findings: Endotracheal tube is approximately 2.4 cm above the carina. Patient has undergone a mitral valve replacement.  Ernestine Conrad- Ganz catheter in the proximal right main pulmonary artery. Evidence for bilateral chest tubes.  No evidence for a large pneumothorax.  Nasogastric tube extends into the abdomen.  There is patchy airspace disease, right side greater than left.  Findings could represent edema.  Decreased pleural effusions compared to the prior examination.  There are epicardial pacer wires.  There is also a right jugular central venous catheter with tip in the SVC region. There appears be a chest drain overlying the cardiac silhouette.  IMPRESSION: Post mitral valve surgery.  Bilateral chest tubes without a large pneumothorax.  Decreased pleural effusions.  Bilateral airspace disease most likely represents asymmetric edema. Recommend attention to the airspace disease in the right lung on follow-up exams to exclude infection or aspiration.  Original Report Authenticated By: Richarda Overlie, M.D.     Medications:     Scheduled Medications:    . acetaminophen (TYLENOL) oral liquid 160 mg/5 mL  650 mg Per Tube NOW   Or  . acetaminophen  650 mg Rectal NOW  . acetaminophen  1,000 mg Oral Q6H   Or  . acetaminophen (TYLENOL) oral liquid 160 mg/5 mL  975 mg Per Tube Q6H  . amiodarone  200 mg Oral BID  . aspirin EC  325 mg Oral Daily   Or  . aspirin  324 mg Per Tube Daily  . bisacodyl  10 mg Oral Daily   Or  . bisacodyl  10 mg Rectal Daily  . cefUROXime (ZINACEF)  IV  1.5 g Intravenous Q12H  . docusate sodium  200 mg Oral Daily  . famotidine (PEPCID) IV  20 mg Intravenous Q12H  . furosemide  20 mg Intravenous Q6H  . insulin aspart  0-24 Units Subcutaneous Q2H  . insulin aspart  0-24 Units Subcutaneous Q4H  . magnesium  sulfate  4 g Intravenous Once  . metoprolol tartrate  12.5 mg Oral BID   Or  . metoprolol tartrate  12.5 mg Per Tube BID  . pantoprazole  40 mg Oral Q1200  . potassium chloride  10 mEq Intravenous Q1 Hr x 3  . sodium chloride  3 mL Intravenous Q12H  . thyroid  60 mg Oral QAC breakfast  . vancomycin (VANCOCIN) IVPB 1000 mg/100 mL central line  1,000 mg Intravenous Once  . warfarin  2.5 mg  Oral q1800  . Warfarin - Physician Dosing Inpatient   Does not apply q1800  . DISCONTD: aminocaproic acid (AMICAR) IVPB  5 g/hr Intravenous To OR  . DISCONTD: amiodarone  200 mg Oral BID  . DISCONTD: amiodarone  400 mg Oral BID  . DISCONTD: furosemide  40 mg Intravenous Q8H  . DISCONTD: insulin aspart  0-24 Units Subcutaneous Q4H  . DISCONTD: insulin regular  0-10 Units Intravenous TID WC  . DISCONTD: potassium chloride  40 mEq Oral Daily  . DISCONTD: tiotropium  18 mcg Inhalation Daily    Infusions:    . sodium chloride Stopped (01/27/12 1000)  . sodium chloride    . sodium chloride 250 mL (01/27/12 0626)  . amiodarone (NEXTERONE PREMIX) 360 mg/200 mL dextrose 30 mg/hr (01/27/12 1600)  . lactated ringers 20 mL/hr at 01/27/12 0600  . milrinone Stopped (01/27/12 1000)  . nitroGLYCERIN Stopped (01/26/12 2102)  . DISCONTD: sodium chloride 10 mL/hr at 01/23/12 2000  . DISCONTD: sodium chloride 10 mL/hr at 01/23/12 2000  . DISCONTD: dexmedetomidine (PRECEDEX) IV infusion Stopped (01/26/12 2228)  . DISCONTD: DOPamine Stopped (01/27/12 0600)  . DISCONTD: EPINEPHrine infusion    . DISCONTD: insulin (NOVOLIN-R) infusion Stopped (01/27/12 0146)  . DISCONTD: milrinone 0.302 mcg/kg/min (01/27/12 0600)  . DISCONTD: nesiritide (NATRECOR) infusion 0.005 mcg/kg/min (01/23/12 2000)  . DISCONTD: phenylephrine (NEO-SYNEPHRINE) Adult infusion Stopped (01/27/12 0500)    PRN Medications: albumin human, lactated ringers, metoprolol, midazolam, morphine injection, morphine, ondansetron (ZOFRAN) IV, oxyCODONE,  potassium chloride, sodium chloride, DISCONTD: acetaminophen, DISCONTD: LORazepam, DISCONTD: ondansetron (ZOFRAN) IV, DISCONTD: sodium chloride irrigation   Assessment:   1) Severe mitral regurgitation s/p MVR (mechanical prosthesis) POD1 2) A/C CHF due to valvular disease 3) Atrial fibrillation s/p Maze procedure POD1 4) Neurogenic bladder 5) Multiple sclerosis   Plan/Discussion:    Ms. Maring is doing well post op day 1. Maintaining NSR (with very long 1 AVB) Her volume status is elevated, agree with IV diuresis at this time.  Management per CVTS. No new recs at this time. Can follow CVPs if needed.   Tenaya Hilyer,MD 5:24 PM   Length of Stay: 11

## 2012-01-27 NOTE — Progress Notes (Signed)
Anesthesiology Follow up:  Ms. Coxe is alert and oriented, neurologically intact, sitting up in bed. Hemodynamically stable overnight, extubated at 04:38 today.  VS: T- 36.6 BP- 136/58 RR-10 HR- 87 (atrial pacing)  CO/CI: 3.2/1.87  PA 29/11  H/H: 32.5/11.4 Plts: 143, 000 K-4.4 Cr.- 0.95  Milrinone 0.3 ug/kg/min all other pressors weaned off.  61 year old WF S/P minimally invasive mitral valve replacement for severe MR. Stable post-op course so far.  Kipp Brood, MD

## 2012-01-27 NOTE — Op Note (Signed)
Intraoperative Transesophageal Echocardiography Report  Ms. [name] [name] is a 61 year old white female who  was recently admitted with congestive heart failure and was found to have severe mitral regurgitation. She is now scheduled to undergo mitral valve repair or replacement by Dr. Tressie Stalker. Intraoperative transesophageal echocardiography was requested to evaluate the mitral valve and to assist with the procedure and to serve as a monitor for intraoperative volume status.  The patient was brought to the operating room at [facility] and general anesthesia was induced without difficulty. Following endotracheal intubation with an endobronchial tube and oral gastric suctioning, the transesophageal echocardiography probe was inserted into the esophagus without difficulty.  Impression pre-bypass findings:  1. Mitral valve: There was severe mitral regurgitation. This was due to a flail posterior leaflet in the P2 and P1 regions. This resulted in a jet of severe mitral regurgitation which was both centrally and anteriorly directed. Multiple torn chordae were evident in the P2 and P1 regions. The antero-lateral commissure was intact without evident flail segments.  2. Aortic valve: The aortic valve was tri-leaflet. The leaflets open normally without stenosis. The leaflets were thin and pliable and were not calcified. There was no aortic insufficiency.  3. Left ventricle: The left ventricular cavity was dilated and was hyperdynamic. The ejection fraction was estimated at 65-70% with vigorous contractility in all segments interrogated. Left ventricular end diastolic diameter was 6.1 cm at end diastole at the mid-papillary level and left ventricular end-systolic diameter was 3.8 cm in the same plane. Left ventricular wall thickness measured 0.75-0.8 cm at end diastole at the mid-papillary level of the  posterior and inferior walls.  4. Right ventricle: The right ventricular cavity size appeared  to be withinin normal limits with good contractility of the right ventricular free wall and normal appearing right ventricular function.  5. Tricuspid valve: There was 2+ tricuspid insufficiency with a central jet. The tricuspid leaflets appeared structurally normal. The tricuspid annulus measured 4.1 cm in diameter.  6: Intra-atrial septum: The interatrial septum was intact without evidence of patent foramen ovale or atrial septal defect by color Doppler or bubble study.  7. Left atrium: Left atrial cavity was enlarged and there was no thrombus noted in the left atrial cavity or left atrial appendage.  8. Ascending Aorta: The  ascending aorta was non-aneurysmal and there was no significant atheromatous disease noted in the aortic wall.  9. Descending Aorta: The descending aorta appeared normal without significant atheromatous disease and measured 2.2 cm in diameter.  Post-bypass findings:  1. Mitral valve: Following initial separation from cardiopulmonary bypass, the mitral valve was imaged using 2-D and color Doppler. There was an annuloplasty ring in the mitral position. The posterior leaflet was fixed and the anterior leaflet was freely mobile. There was a jet of mitral insufficiency which was judged as 2+. The patient was returned to cardiopulmonary bypass  Following separation from cardiopulmonary bypass the second time, the mitral valve was again imaged. There was a bileaflet mechanical prosthesis in the mitral position. Both leaflets open normally. There were normal-appearing washing jets of mitral insufficiency consistent with a normally functioning bileaflet mechanical valve. No perivalvular leaks could be appreciated with extensive Omni plane scanning from 0-180 using color Doppler.  2. Aortic valve: the aortic valve appeared normal and was unchanged from the pre-bypass study.  3. Left ventricle: Upon separation from cardiopulmonary bypass there was  left ventricular hypokinesis which  was most prominent in the inferior wall. There was also dyssynchronous contractility of the  left ventricle due to ventricular pacing. The left ventricular contractility improved during the bot post-bypass period and the left ventricular ejection fraction was estimated at 40-45% when the transesophageal echocardiography probe was removed.  4. Right ventricle: The right ventricular cavity was dilated following the initial separation from cardiopulmonary bypass and there was decreased contractility of the right ventricular free wall. These findings improved during the post-bypass period.  5. Tricuspid valve: The tricuspid valve was difficult to image do to acoustic shadowing from the mitral prosthesis but there appeared to be 2+ tricuspid insufficiency which appeared unchanged from the pre-bypass study.  Kipp Brood, M.D.

## 2012-01-27 NOTE — Procedures (Signed)
Extubation Procedure Note  Patient Details:   Name: [name] [name] DOB: June 21, 1951 MRN: [id number]   Airway Documentation:  Airway 8 mm (Active)  Secured at (cm) 24 cm 01/27/2012  4:38 AM  Measured From Lips 01/27/2012  4:38 AM  Secured Location Right 01/27/2012  4:38 AM  Secured By Caron Presume Tape 01/27/2012  4:38 AM  Cuff Pressure (cm H2O) 24 cm H2O 01/26/2012 11:07 PM  Site Condition Dry 01/27/2012  4:38 AM    Evaluation  O2 sats: stable throughout Complications: No apparent complications Patient did tolerate procedure well. Bilateral Breath Sounds: Clear Suctioning: Airway Yes  Filbert Schilder 01/27/2012, 5:15 AM   Patient was weaned, performed SBT, coached on deep breathing, cough and was extubated to a 4 L nasal cannula.  No evidence of stridor present.

## 2012-01-27 NOTE — Progress Notes (Addendum)
   CARDIOTHORACIC SURGERY PROGRESS NOTE   R1 Day Post-Op Procedure(s) (LRB): MAZE (N/A) MINIMALLY INVASIVE MITRAL VALVE (MV) REPLACEMENT (Right) CHEST TUBE INSERTION (Left)  Subjective: Looks good.  Denies any pain or SOB.  Feels sleepy.  Objective: Vital signs: BP Readings from Last 1 Encounters:  01/27/12 110/69   Pulse Readings from Last 1 Encounters:  01/27/12 86   Resp Readings from Last 1 Encounters:  01/27/12 21   Temp Readings from Last 1 Encounters:  01/27/12 97.9 F (36.6 C) Core (Comment)    Hemodynamics: PAP: (27-40)/(9-22) 28/10 mmHg CO:  [3.1 L/min-3.8 L/min] 3.4 L/min CI:  [1.8 L/min/m2-2.2 L/min/m2] 2 L/min/m2  Physical Exam:  Rhythm:   sinus  Breath sounds: clear  Heart sounds:  RRR with mechanical sounds, no murmur  Incisions:  Dressings dry  Abdomen:  Soft, non distended, non tender  Extremities:  Warm, well perfused   Intake/Output from previous day: 04/09 0701 - 04/10 0700 In: 1610 [I.V.:3881; RUEAV:4098; NG/GT:90; IV Piggyback:2100] Out: 1191 [YNWGN:5621; Emesis/NG output:150; Blood:1400; Chest Tube:710] Intake/Output this shift:    Lab Results:  Basename 01/27/12 0223 01/26/12 1908 01/26/12 1906  WBC 13.7* -- 15.6*  HGB 11.8* 10.2* --  HCT 33.6* 30.0* --  PLT 183 -- 143*   BMET:  Basename 01/27/12 0223 01/26/12 1908 01/26/12 0510  NA 139 141 --  K 4.4 3.4* --  CL 107 -- 91*  CO2 24 -- 29  GLUCOSE 113* 92 --  BUN 17 -- 25*  CREATININE 0.68 -- 0.95  CALCIUM 8.7 -- 9.7    CBG (last 3)   Basename 01/27/12 0636 01/27/12 0404 01/27/12 0145  GLUCAP 138* 161* 77   ABG    Component Value Date/Time   PHART 7.383 01/27/2012 0638   HCO3 21.7 01/27/2012 0638   TCO2 23 01/27/2012 0638   ACIDBASEDEF 3.0* 01/27/2012 0638   O2SAT 98.0 01/27/2012 0638   CXR: Looks good  Assessment/Plan: S/P Procedure(s) (LRB): MAZE (N/A) MINIMALLY INVASIVE MITRAL VALVE (MV) REPLACEMENT (Right) CHEST TUBE INSERTION (Left)  Doing well  POD1 Expected post op acute blood loss anemia, mild, stable Expected post op volume excess   Mobilize  Diuresis  D/C lines  Wean milrinone off  Start coumadin  Restart amiodarone  Ismahan Lippman H 01/27/2012 8:19 AM

## 2012-01-27 NOTE — Progress Notes (Signed)
Patient unable to substain SICU rapid wean at this time.  Patient is able to follow commands but cannot maintain an adequate minute volume or spontaneous tidal volume when place on a RR of 4 and 40%  Gypsy Decant, RRT, RCP

## 2012-01-27 NOTE — Progress Notes (Signed)
Patient ID: [name] [name], female   DOB: 1951/07/05, 61 y.o.   MRN: [id number] Just back from walking BP 114/71  Pulse 66  Temp(Src) 97.5 F (36.4 C) (Oral)  Resp 23  Ht 5\' 9"  (1.753 m)  Wt 155 lb 13.8 oz (70.7 kg)  BMI 23.02 kg/m2  SpO2 99%  Intake/Output Summary (Last 24 hours) at 01/27/12 1819 Last data filed at 01/27/12 1600  Gross per 24 hour  Intake 5891.42 ml  Output   3216 ml  Net 2675.42 ml   Continue diuresis Creatinine and lytes OK

## 2012-01-27 NOTE — Progress Notes (Signed)
Patient unable to substain SICU rapid wean at this time. Patient is able to follow commands but cannot maintain an adequate minute volume or a consistent spontaneous tidal volume when place on a RR of 4 and 40% Gypsy Decant, RRT, RCP

## 2012-01-27 NOTE — Addendum Note (Signed)
Addendum  created 01/27/12 6045 by Kipp Brood, MD   Modules edited:Notes Section

## 2012-01-27 NOTE — Addendum Note (Signed)
Addendum  created 01/27/12 0849 by Kipp Brood, MD   Modules edited:Notes Section

## 2012-01-28 ENCOUNTER — Inpatient Hospital Stay (HOSPITAL_COMMUNITY): Payer: Medicare Other

## 2012-01-28 LAB — GLUCOSE, CAPILLARY
Glucose-Capillary: 133 mg/dL — ABNORMAL HIGH (ref 70–99)
Glucose-Capillary: 142 mg/dL — ABNORMAL HIGH (ref 70–99)

## 2012-01-28 LAB — BASIC METABOLIC PANEL
BUN: 28 mg/dL — ABNORMAL HIGH (ref 6–23)
Calcium: 8.9 mg/dL (ref 8.4–10.5)
Chloride: 102 mEq/L (ref 96–112)
Creatinine, Ser: 1.09 mg/dL (ref 0.50–1.10)
GFR calc Af Amer: 62 mL/min — ABNORMAL LOW (ref >90)
GFR calc non Af Amer: 53 mL/min — ABNORMAL LOW (ref >90)

## 2012-01-28 LAB — PROTIME-INR
INR: 2.03 — ABNORMAL HIGH (ref 0.00–1.49)
Prothrombin Time: 23.3 seconds — ABNORMAL HIGH (ref 11.6–15.2)

## 2012-01-28 LAB — CBC
HCT: 38.1 % (ref 36.0–46.0)
MCHC: 34.6 g/dL (ref 30.0–36.0)
Platelets: 139 10*3/uL — ABNORMAL LOW (ref 150–400)
RDW: 17.4 % — ABNORMAL HIGH (ref 11.5–15.5)
WBC: 16.9 10*3/uL — ABNORMAL HIGH (ref 4.0–10.5)

## 2012-01-28 MED ORDER — ENSURE COMPLETE PO LIQD
237.0000 mL | Freq: Three times a day (TID) | ORAL | Status: DC
  Administered 2012-01-28 – 2012-02-03 (×14): 237 mL via ORAL

## 2012-01-28 MED ORDER — SODIUM CHLORIDE 0.9 % IJ SOLN: 3.0000 mL | INTRAMUSCULAR | Status: DC | PRN

## 2012-01-28 MED ORDER — TRAMADOL HCL 50 MG PO TABS: 50.0000 mg | ORAL_TABLET | ORAL | Status: DC | PRN

## 2012-01-28 MED ORDER — SODIUM CHLORIDE 0.9 % IJ SOLN
3.0000 mL | Freq: Two times a day (BID) | INTRAMUSCULAR | Status: DC
  Administered 2012-01-28 – 2012-02-03 (×4): 3 mL via INTRAVENOUS

## 2012-01-28 MED ORDER — SODIUM CHLORIDE 0.9 % IV SOLN: 250.0000 mL | INTRAVENOUS | Status: DC | PRN

## 2012-01-28 MED ORDER — POTASSIUM CHLORIDE CRYS ER 20 MEQ PO TBCR
20.0000 meq | EXTENDED_RELEASE_TABLET | Freq: Two times a day (BID) | ORAL | Status: DC
  Administered 2012-01-29 – 2012-02-03 (×11): 20 meq via ORAL
  Filled 2012-01-28 (×12): qty 1

## 2012-01-28 MED ORDER — MOVING RIGHT ALONG BOOK
Freq: Once | Status: AC
  Administered 2012-01-28: 08:00:00
  Filled 2012-01-28: qty 1

## 2012-01-28 MED ORDER — FUROSEMIDE 10 MG/ML IJ SOLN
40.0000 mg | Freq: Two times a day (BID) | INTRAMUSCULAR | Status: AC
  Administered 2012-01-28 (×2): 40 mg via INTRAVENOUS
  Filled 2012-01-28 (×2): qty 4

## 2012-01-28 MED ORDER — FUROSEMIDE 40 MG PO TABS
40.0000 mg | ORAL_TABLET | Freq: Two times a day (BID) | ORAL | Status: DC
  Administered 2012-01-29 – 2012-02-01 (×7): 40 mg via ORAL
  Filled 2012-01-28 (×9): qty 1

## 2012-01-28 NOTE — Evaluation (Signed)
Physical Therapy Evaluation Patient Details Name: [name] [name] MRN: [id number] DOB: 30-Aug-1951 Today's Date: 01/28/2012  Problem List:  Patient Active Problem List  Diagnoses  . HYPOTHYROIDISM  . Multiple sclerosis  . MITRAL REGURGITATION  . NEUROGENIC BLADDER  . ROSACEA  . OSTEOPENIA  . URINARY INCONTINENCE  . BREAST CANCER, HX OF  . COLONOSCOPY, HX OF  . GENITAL HERPES, HX OF  . CHF (congestive heart failure)  . Anxiety  . Tachycardia  . Pulmonary hypertension  . Hypoxemia  . Hypokalemia  . COPD with emphysema  . Atrial fibrillation  . S/P mitral valve replacement  . S/P Maze operation for atrial fibrillation    Past Medical History:  Past Medical History  Diagnosis Date  . Multiple sclerosis   . Breast cancer   . Ovarian cyst   . Hypothyroidism   . Mitral valve regurgitation   . CHF (congestive heart failure)   . Shortness of breath   . Multiple sclerosis   . Neuromuscular disorder     ms  . Arthritis     knees  . 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 S/P Maze operation for atrial fibrillation 01/26/2012    Complete biatrial lesion set using cryothermy via right mini thoracotomy   Past Surgical History:  Past Surgical History  Procedure Date  . Breast lumpectomy   . Lymphadenectomy   . Wrist surgery   . Hernia repair   . Tonsillectomy   . Cystoscopy   . Laparoscopy   . Tee without cardioversion 01/19/2012    Procedure: TRANSESOPHAGEAL ECHOCARDIOGRAM (TEE);  Surgeon: Peter M Swaziland, MD;  Location: [facility] ENDOSCOPY;  Service: Cardiovascular;  Laterality: N/A;  . Maze 01/26/2012    Procedure: MAZE;  Surgeon: Purcell Nails, MD;  Location: [facility] OR;  Service: Open Heart Surgery;  Laterality: N/A;  . Mitral valve replacement 01/26/2012    Procedure: MINIMALLY INVASIVE MITRAL VALVE (MV) REPLACEMENT;  Surgeon: Purcell Nails, MD;  Location: [facility] OR;  Service: Open Heart Surgery;   Laterality: Right;  . Chest tube insertion 01/26/2012    Procedure: CHEST TUBE INSERTION;  Surgeon: Purcell Nails, MD;  Location: [facility] OR;  Service: Open Heart Surgery;  Laterality: Left;    PT Assessment/Plan/Recommendation PT Assessment Clinical Impression Statement: Patient s/p MVR with decr mobility secondary to pain and lines and tubes.  Feeling poorly today therefore refused to walk.  Will most likely need NHP on d/c. PT Recommendation/Assessment: Patient will need skilled PT in the acute care venue PT Problem List: Decreased strength;Decreased activity tolerance;Decreased balance;Decreased mobility;Decreased safety awareness;Decreased knowledge of precautions;Decreased knowledge of use of DME PT Therapy Diagnosis : Generalized weakness PT Plan PT Frequency: Min 3X/week PT Treatment/Interventions: DME instruction;Gait training;Functional mobility training;Therapeutic activities;Therapeutic exercise;Balance training;Patient/family education PT Recommendation Recommendations for Other Services: OT consult Follow Up Recommendations: Skilled nursing facility;Supervision/Assistance - 24 hour Equipment Recommended: Defer to next venue PT Goals  Acute Rehab PT Goals PT Goal Formulation: With patient Time For Goal Achievement: 2 weeks Pt will go Supine/Side to Sit: with modified independence PT Goal: Supine/Side to Sit - Progress: Goal set today Pt will go Sit to Supine/Side: with modified independence PT Goal: Sit to Supine/Side - Progress: Goal set today Pt will go Sit to Stand: with modified independence;with upper extremity assist PT Goal: Sit to Stand - Progress: Goal set today Pt will go Stand to Sit: with modified independence;with upper extremity assist PT  Goal: Stand to Sit - Progress: Goal set today Pt will Transfer Bed to Chair/Chair to Bed: with supervision PT Transfer Goal: Bed to Chair/Chair to Bed - Progress: Goal set today Pt will Ambulate: >150 feet;with modified  independence;with least restrictive assistive device PT Goal: Ambulate - Progress: Goal set today Pt will Perform Home Exercise Program: with supervision, verbal cues required/provided PT Goal: Perform Home Exercise Program - Progress: Goal set today  PT Evaluation Precautions/Restrictions   Patient has 2 chest tubes Prior Functioning  Home Living Lives With: Alone Available Help at Discharge: Family;Friend(s) Type of Home: House Home Access: Stairs to enter [employer] of Steps: 3 Entrance Stairs-Rails: Can reach both Home Layout: Two level;Able to live on main level with bedroom/bathroom;1/2 bath on main level Alternate Level Stairs-Number of Steps: flight Alternate Level Stairs-Rails: Can reach both Bathroom Shower/Tub: Walk-in shower;Door [employer] Toilet: Standard Home Adaptive Equipment: Bedside commode/3-in-1;Straight cane Prior Function Level of Independence: Independent Able to Take Stairs?: Yes Driving: Yes Vocation: Retired Financial risk analyst Arousal/Alertness: Awake/alert Overall Cognitive Status: Appears within functional limits for tasks assessed Orientation Level: Oriented X4 Sensation/Coordination Sensation Light Touch: Appears Intact Stereognosis: Not tested Hot/Cold: Not tested Proprioception: Not tested Coordination Gross Motor Movements are Fluid and Coordinated: Yes Fine Motor Movements are Fluid and Coordinated: Yes Extremity Assessment RUE Assessment RUE Assessment: Within Functional Limits LUE Assessment LUE Assessment: Within Functional Limits RLE Assessment RLE Assessment: Within Functional Limits LLE Assessment LLE Assessment: Within Functional Limits Mobility (including Balance) Bed Mobility Bed Mobility: Yes Rolling Right: 1: +2 Total assist;Patient percentage (comment);With rail (HOB at 80 degrees; pt = 70%) Right Sidelying to Sit: 1: +2 Total assist;Patient percentage (comment);With rails;HOB elevated (comment degrees)  (HOB at 80 degrees; pt = 70%) Sitting - Scoot to Edge of Bed: 1: +2 Total assist;Patient percentage (comment) (pt = 50%) Sitting - Scoot to Edge of Bed Details (indicate cue type and reason): assisted with pad Transfers Transfers: Yes Sit to Stand: 1: +2 Total assist;Patient percentage (comment);From elevated surface;With upper extremity assist;From bed (pt = 50%) Sit to Stand Details (indicate cue type and reason): cues for hand placement Stand to Sit: 1: +2 Total assist;Patient percentage (comment);With upper extremity assist;With armrests;To chair/3-in-1 (pt = 50%) Stand to Sit Details: assist to control descent, cues for hand placement Stand Pivot Transfers: 1: +2 Total assist;Patient percentage (comment) (pt = 65%) Stand Pivot Transfer Details (indicate cue type and reason): Patient not standing fully upright.  Wide BOS.  Used RW to step from bed to chair.  Took several steps to get to chair. Ambulation/Gait Ambulation/Gait: No Stairs: No Wheelchair Mobility Wheelchair Mobility: No  Posture/Postural Control Posture/Postural Control: Postural limitations Postural Limitations: forward rounded shoulders    End of Session PT - End of Session Equipment Utilized During Treatment: Gait belt Activity Tolerance: Patient limited by fatigue;Patient limited by pain Patient left: in chair;with call bell in reach Nurse Communication: Mobility status for transfers General Behavior During Session: [facility] for tasks performed Cognition: [facility] for tasks performed INGOLD,Rubye Strohmeyer 01/28/2012, 12:56 PM  [facility] Acute Rehabilitation [phone] (pager)

## 2012-01-28 NOTE — Progress Notes (Signed)
   CARE MANAGEMENT NOTE 01/28/2012  Patient:  MASAYO, FERA   Account Number:  [id number]  Date Initiated:  01/28/2012  Documentation initiated by:  [facility]  Subjective/Objective Assessment:   post op VR and Maze on 01-26-12.  Lives alone     Action/Plan:   PTA, PT INDEPENDENT, LIVES ALONE.  DISCUSSED DC PLANNING WITH PT; SHE WILL NEED SHORT TERM SNF FOR REHAB AT DISCHARGE, AS SHE HAS LIMITED SUPPORT.  WILL OBTAIN P.T. CONSULT.   Anticipated DC Date:  02/01/2012   Anticipated DC Plan:  SKILLED NURSING FACILITY  In-house referral  Clinical Social Worker      DC Planning Services  CM consult      Choice offered to / List presented to:             Status of service:  In process, will continue to follow Medicare Important Message given?   (If response is "NO", the following Medicare IM given date fields will be blank) Date Medicare IM given:   Date Additional Medicare IM given:    Discharge Disposition:    Per UR Regulation:  Reviewed for med. necessity/level of care/duration of stay  If discussed at Long Length of Stay Meetings, dates discussed:   01/27/2012    Comments:  01/28/12 Kylia Grajales,RN,BSN 1400 REFERRAL TO CSW TO FACILITATE DC TO SNF WHEN MEDICALLY STABLE FOR DISCHARGE.

## 2012-01-28 NOTE — Progress Notes (Signed)
All medications, charting, assessments, patient care and procedures given by Amy V-Smith, SN [facility] has been supervised and verified by this RN from 4/10 [phone]

## 2012-01-28 NOTE — Progress Notes (Signed)
UR Completed.  Tamicka Shimon Jane [phone] 01/28/2012  

## 2012-01-28 NOTE — Plan of Care (Signed)
Problem: Phase III Progression Outcomes Goal: Time patient transferred to PCTU/Telemetry POD Outcome: Completed/Met Date Met:  01/28/12 11:45am

## 2012-01-28 NOTE — Progress Notes (Signed)
   CARDIOTHORACIC SURGERY PROGRESS NOTE   R2 Days Post-Op Procedure(s) (LRB): MAZE (N/A) MINIMALLY INVASIVE MITRAL VALVE (MV) REPLACEMENT (Right) CHEST TUBE INSERTION (Left)  Subjective: Feels weak but otherwise okay.  Minimal pain only with movement, cough.  No SOB.  Objective: Vital signs: BP Readings from Last 1 Encounters:  01/28/12 101/66   Pulse Readings from Last 1 Encounters:  01/28/12 58   Resp Readings from Last 1 Encounters:  01/28/12 14   Temp Readings from Last 1 Encounters:  01/28/12 97.4 F (36.3 C) Oral    Hemodynamics: PAP: (33-42)/(16-23) 42/23 mmHg CO:  [3.2 L/min] 3.2 L/min CI:  [1.9 L/min/m2] 1.9 L/min/m2  Physical Exam:  Rhythm:   sinus  Breath sounds: clear  Heart sounds:  RRR  Incisions:  Dressings dry  Abdomen:  soft  Extremities:  Warm  Chest tubes:  Serous drainage approx 200 mL/shift   Intake/Output from previous day: 04/10 0701 - 04/11 0700 In: 3340.8 [P.O.:960; I.V.:2276.8; IV Piggyback:104] Out: 1706 [Urine:740; Stool:1; Chest Tube:965] Intake/Output this shift: Total I/O In: -  Out: 10 [Urine:10]  Lab Results:  [facility] 01/28/12 0415 01/27/12 1630  WBC 16.9* 15.9*  HGB 13.2 12.8  HCT 38.1 37.4  PLT 139* 171   BMET:  Basename 01/28/12 0415 01/27/12 1630 01/27/12 1627 01/27/12 0223  NA 135 -- 137 --  K 4.4 -- 4.4 --  CL 102 -- 104 --  CO2 23 -- -- 24  GLUCOSE 129* -- 158* --  BUN 28* -- 22 --  CREATININE 1.09 0.85 -- --  CALCIUM 8.9 -- -- 8.7    CBG (last 3)   Basename 01/28/12 0344 01/27/12 2358 01/27/12 2207  GLUCAP 142* 133* 154*   ABG    Component Value Date/Time   PHART 7.383 01/27/2012 0638   HCO3 21.7 01/27/2012 0638   TCO2 23 01/27/2012 1627   ACIDBASEDEF 3.0* 01/27/2012 0638   O2SAT 98.0 01/27/2012 0638   PT/INR:  Results for IDANIA, DESOUZA (MRN [id number]) as of 01/28/2012 07:39  Ref. Range 01/28/2012 04:15  Prothrombin Time Latest Range: 11.6-15.2 seconds 23.3 (H)  INR Latest Range: 0.00-1.49   2.03 (H)    CXR: stable  Assessment/Plan: S/P Procedure(s) (LRB): MAZE (N/A) MINIMALLY INVASIVE MITRAL VALVE (MV) REPLACEMENT (Right) CHEST TUBE INSERTION (Left)  Doing well POD2 Expected post op acute blood loss anemia, mild, stable Expected post op volume excess, modest diuresis Protein-depleted malnutrition - preop Generalized weakness - pre and postop Chest tubes still draining serous fluid S/P mechanical MVR - INR already > 2.0 likely due to protein-depletion, not coumadin S/P maze - maintaining NSR so far postop - HR decreased on IV amiodarone   Mobilize  Diuresis  Transfer step down  PT consult  Nutritional supplements  Continue amiodarone + coumadin  AAI pace and hold beta blocker    Domingue Coltrain H 01/28/2012 7:36 AM

## 2012-01-28 NOTE — Progress Notes (Signed)
Clinical Social Work Department CLINICAL SOCIAL WORK PLACEMENT NOTE 01/28/2012  Patient:  ELERI, RUBEN  Account Number:  [id number] Admit date:  01/16/2012  Clinical Social Worker:  Baxter Flattery, LCSWA  Date/time:  01/28/2012 03:00 PM  Clinical Social Work is seeking post-discharge placement for this patient at the following level of care:   SKILLED NURSING   (*CSW will update this form in Epic as items are completed)     Patient/family provided with [facility] Department of Clinical Social Work's list of facilities offering this level of care within the geographic area requested by the patient (or if unable, by the patient's family).    Patient/family informed of their freedom to choose among providers that offer the needed level of care, that participate in Medicare, Medicaid or managed care program needed by the patient, have an available bed and are willing to accept the patient.    Patient/family informed of [facility]' ownership interest in [facility], as well as of the fact that they are under no obligation to receive care at this facility.  PASARR submitted to EDS on  PASARR number received from EDS on   FL2 transmitted to all facilities in geographic area requested by pt/family on  01/28/2012 FL2 transmitted to all facilities within larger geographic area on   Patient informed that his/her managed care company has contracts with or will negotiate with  certain facilities, including the following:   [facility]     Patient/family informed of bed offers received:  01/28/2012 Patient chooses bed at [facility] Physician recommends and patient chooses bed at    Patient to be transferred to [facility] on   Patient to be transferred to facility by ptar  The following physician request were entered in Epic:   Additional Comments: Pt requesting placement at [facility]. Facility confirms bed availability. Pt has  existing PASSAR

## 2012-01-28 NOTE — Progress Notes (Signed)
Clinical Social Work Department BRIEF PSYCHOSOCIAL ASSESSMENT 01/28/2012  Patient:  DEAUN, ROCHA     Account Number:  [id number]     Admit date:  01/16/2012  Clinical Social Worker:  Mee Hives  Date/Time:  01/28/2012 03:00 PM  Referred by:  Care Management  Date Referred:  01/28/2012 Referred for  SNF Placement   Other Referral:   Interview type:  Patient Other interview type:    PSYCHOSOCIAL DATA Living Status:  ALONE Admitted from facility:   Level of care:   Primary support name:   Primary support relationship to patient:  FAMILY Degree of support available:   Pt daughter in law is support, lives in [place]. Pt has "many friends" in the area, but no living family. Pt mother passed away in 01/15/2012   CURRENT CONCERNS Current Concerns  Post-Acute Placement  Adjustment to Illness  Other - See comment   Other Concerns:   Pt is grieving loss of her mother,    SOCIAL WORK ASSESSMENT / PLAN Pt weak and deconditioned s/p MVR, states she was on the table for 9 hours and her recovery has been very slow. 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 Pt seems to have suffered a great deal of loss and is likely also experiencing depressed feelings around decline of her own health. Pt has limited social support. With regard to disposition,pt requesting placement at [facility], is very familiar with this facility. CSW contacted facility and sent clinicals, confirmed pt has bed there when medically ready. CSW will continue to follow.   Assessment/plan status:  Other - See comment Other assessment/ plan:   SNF at d/c   Information/referral to community resources:    PATIENT'S/FAMILY'S RESPONSE TO PLAN OF CARE: Pt very appreciative and requests CSW check in every couple of days.

## 2012-01-28 NOTE — Progress Notes (Signed)
CARDIAC REHAB PHASE I   PRE:  Rate/Rhythm: 70PACING  BP:  Supine:   Sitting: 121/71  Standing:    SaO2: 100% 2L  MODE:  Ambulation: 150 ft   POST:  Rate/Rhythem: 74-84  BP:  Supine:   Sitting: 125/72  Standing:    SaO2: 96%2L 1312-1400 Pt assisted x 2 to walk 150 ft pushing wheelchair on oxygen at 2L. All equipment intact. Took many standing rest breaks due to SOB and weak legs.  To recliner on chair alarm. Call bell in reach.tired by end of walk. Set up lunch.  Duanne Limerick

## 2012-01-29 ENCOUNTER — Encounter: Payer: Self-pay | Admitting: Thoracic Surgery (Cardiothoracic Vascular Surgery)

## 2012-01-29 ENCOUNTER — Inpatient Hospital Stay (HOSPITAL_COMMUNITY): Payer: Medicare Other

## 2012-01-29 DIAGNOSIS — I498 Other specified cardiac arrhythmias: Secondary | ICD-10-CM

## 2012-01-29 DIAGNOSIS — I440 Atrioventricular block, first degree: Secondary | ICD-10-CM | POA: Insufficient documentation

## 2012-01-29 LAB — TYPE AND SCREEN
ABO/RH(D): A POS
Antibody Screen: NEGATIVE
Unit division: 0
Unit division: 0
Unit division: 0

## 2012-01-29 LAB — CBC
HCT: 36.2 % (ref 36.0–46.0)
Hemoglobin: 12.5 g/dL (ref 12.0–15.0)
MCH: 30.9 pg (ref 26.0–34.0)
MCHC: 34.5 g/dL (ref 30.0–36.0)
MCV: 89.6 fL (ref 78.0–100.0)
RDW: 16.3 % — ABNORMAL HIGH (ref 11.5–15.5)

## 2012-01-29 LAB — BASIC METABOLIC PANEL
BUN: 30 mg/dL — ABNORMAL HIGH (ref 6–23)
Calcium: 8.6 mg/dL (ref 8.4–10.5)
Creatinine, Ser: 0.77 mg/dL (ref 0.50–1.10)
GFR calc Af Amer: >90 mL/min (ref >90)
GFR calc non Af Amer: 88 mL/min — ABNORMAL LOW (ref >90)
Glucose, Bld: 107 mg/dL — ABNORMAL HIGH (ref 70–99)
Potassium: 4 mEq/L (ref 3.5–5.1)

## 2012-01-29 MED ORDER — PATIENT'S GUIDE TO USING COUMADIN BOOK
Freq: Once | Status: AC
  Administered 2012-01-29: 17:00:00
  Filled 2012-01-29: qty 1

## 2012-01-29 MED ORDER — POTASSIUM CHLORIDE CRYS ER 20 MEQ PO TBCR
40.0000 meq | EXTENDED_RELEASE_TABLET | Freq: Once | ORAL | Status: AC
  Administered 2012-01-29: 40 meq via ORAL
  Filled 2012-01-29: qty 2

## 2012-01-29 MED ORDER — WARFARIN VIDEO
Freq: Once | Status: AC
  Administered 2012-01-30: 10:00:00

## 2012-01-29 MED FILL — Albumin, Human Inj 5%: INTRAVENOUS | Qty: 250 | Status: AC

## 2012-01-29 MED FILL — Electrolyte-R (PH 7.4) Solution: INTRAVENOUS | Qty: 4000 | Status: AC

## 2012-01-29 MED FILL — Sodium Chloride Irrigation Soln 0.9%: Qty: 3000 | Status: AC

## 2012-01-29 MED FILL — Sodium Bicarbonate IV Soln 8.4%: INTRAVENOUS | Qty: 50 | Status: AC

## 2012-01-29 MED FILL — Sodium Chloride IV Soln 0.9%: INTRAVENOUS | Qty: 1000 | Status: AC

## 2012-01-29 MED FILL — Mannitol IV Soln 20%: INTRAVENOUS | Qty: 500 | Status: AC

## 2012-01-29 MED FILL — Heparin Sodium (Porcine) Inj 1000 Unit/ML: INTRAMUSCULAR | Qty: 30 | Status: AC

## 2012-01-29 MED FILL — Lidocaine HCl IV Inj 20 MG/ML: INTRAVENOUS | Qty: 10 | Status: AC

## 2012-01-29 MED FILL — Heparin Sodium (Porcine) Inj 1000 Unit/ML: INTRAMUSCULAR | Qty: 40 | Status: AC

## 2012-01-29 NOTE — Progress Notes (Addendum)
Subjective:    [name] [name] states she feels better than yesterday.  She does complain of pain at her chest tube sites and surgical incision.  She also states that Cardiology told her about her heart being in a junctional rhythm and the patient is concerned that she will develop A. Fib and hopes that we can prevent that from happening.  Objective:  Vital Signs in the last 24 hours: Temp:  [97.8 F (36.6 C)-98.3 F (36.8 C)] 97.8 F (36.6 C) (04/12 0453) Pulse Rate:  [70-88] 86  (04/12 0453) Resp:  [11-25] 18  (04/12 0453) BP: (112-133)/(65-74) 120/65 mmHg (04/12 0453) SpO2:  [96 %-100 %] 99 % (04/12 0453) Weight:  [155 lb 6.8 oz (70.5 kg)] 155 lb 6.8 oz (70.5 kg) (04/12 0453)  Intake/Output from previous day: 04/11 0701 - 04/12 0700 In: 123 [P.O.:120; I.V.:3] Out: 1020 [Urine:630; Chest Tube:390] Intake/Output from this shift:    Physical Exam: General appearance: alert, cooperative and no distress Lungs: clear to auscultation bilaterally Heart: regular rate and rhythm Abdomen: soft, non-tender; bowel sounds normal; no masses,  no organomegaly Extremities: edema trace Skin: incision healing well, no drainage or erythema present  Lab Results:  Basename 01/29/12 0500 01/28/12 0415  WBC 13.4* 16.9*  HGB 12.5 13.2  PLT 154 139*    Basename 01/28/12 0415 01/27/12 1630 01/27/12 1627 01/27/12 0223  NA 135 -- 137 --  K 4.4 -- 4.4 --  CL 102 -- 104 --  CO2 23 -- -- 24  GLUCOSE 129* -- 158* --  BUN 28* -- 22 --  CREATININE 1.09 0.85 -- --   No results found for this basename: TROPONINI:2,CK,MB:2 in the last 72 hours Hepatic Function Panel No results found for this basename: PROT,ALBUMIN,AST,ALT,ALKPHOS,BILITOT,BILIDIR,IBILI in the last 72 hours No results found for this basename: CHOL in the last 72 hours No results found for this basename: PROTIME in the last 72 hours   Assessment/Plan:   1. S/P Mini MVR/MAZE procedure 2. CV- patient appears to be in first degree AV  block, patient remains A. Paced.  Patient currently on Amiodarone for A. Fib prophylaxis, Cardiology has evaluated patient and recommends discontinuing Amiodarone feeling patient is suffering from junctional rhythm, will discuss with Dr. Cornelius Moras 3. Chest tubes- 24hr output of 480, CXR ordered for this morning, will leave in place today 4. Resp- patient remains on oxygen, will attempt to wean as tolerated, encouraged IS use 5. Fluid Balance- patient with modest po intake, good U/O, weight up 6kg from admission, will continue diuresis 6. INR 2.10 this morning- will continue 2.5mg  coumadin tonight, ideally patients INR needs to be 2.5 to 3.5   LOS: 13 days    [name], [name] 01/29/2012, 8:37 AM    I have seen and examined the patient and agree with the assessment and plan as outlined.  Atrial lead confirms that her rhythm is sinus with 1st degree AV block.  Continue Amiodarone.  Keep chest tubes 1 more day.  Anticipate d/c to SNF on Monday.  [name] [name] 01/29/2012 9:12 AM

## 2012-01-29 NOTE — Progress Notes (Addendum)
1425 - d/c EPW per protocol and as ordered, all ends intact, no ectopy/bleeding noted, pt tolerated well.  Pt reminded to lie supine approximately one hour. VS WNL, INR 2.10 (Erin, PA notified, order to continue with pulling wires).  Will continue to monitor.

## 2012-01-29 NOTE — Progress Notes (Signed)
CARDIAC REHAB PHASE I   PRE:  Rate/Rhythm: 88 SR    BP: sitting 112/66    SaO2: 100 2L  MODE:  Ambulation: 350 ft   POST:  Rate/Rhythm: 101    BP: sitting 112/82     SaO2: 100 2L  Stronger today. Used W/C to push, assist x2 with 2L O2. x2 rest stops. Return to bed for EPW. Will f/u. [id number]  Harriet Masson CES, ACSM

## 2012-01-29 NOTE — Progress Notes (Signed)
Patient ID: [name] [name], female   DOB: Oct 04, 1951, 60 y.o.   MRN: [id number]    SUBJECTIVE: The patient feels better than yesterday. She has chest tubes in. I do not see P waves on her telemetry at this time. This may be a junctional rhythm. The rate is stable at 85 and her blood pressure is stable. She's had first degree AV block in the past.  Filed Vitals:   01/28/12 1058 01/28/12 1150 01/28/12 2012 01/29/12 0453  BP: 112/69 133/74 112/67 120/65  Pulse: 77 88 86 86  Temp:  97.9 F (36.6 C) 98.3 F (36.8 C) 97.8 F (36.6 C)  TempSrc:  Oral Oral Oral  Resp:  18 18 18   Height:      Weight:    155 lb 6.8 oz (70.5 kg)  SpO2:  96% 100% 99%    Intake/Output Summary (Last 24 hours) at 01/29/12 5784 Last data filed at 01/28/12 2130  Gross per 24 hour  Intake    123 ml  Output    950 ml  Net   -827 ml    LABS: Basic Metabolic Panel:  Basename 01/28/12 0415 01/27/12 1630 01/27/12 1627 01/27/12 0223  NA 135 -- 137 --  K 4.4 -- 4.4 --  CL 102 -- 104 --  CO2 23 -- -- 24  GLUCOSE 129* -- 158* --  BUN 28* -- 22 --  CREATININE 1.09 0.85 -- --  CALCIUM 8.9 -- -- 8.7  MG -- 2.4 -- 3.1*  PHOS -- -- -- --   Liver Function Tests: No results found for this basename: AST:2,ALT:2,ALKPHOS:2,BILITOT:2,PROT:2,ALBUMIN:2 in the last 72 hours No results found for this basename: LIPASE:2,AMYLASE:2 in the last 72 hours CBC:  Basename 01/28/12 0415 01/27/12 1630  WBC 16.9* 15.9*  NEUTROABS -- --  HGB 13.2 12.8  HCT 38.1 37.4  MCV 88.2 88.2  PLT 139* 171   Cardiac Enzymes: No results found for this basename: CKTOTAL:3,CKMB:3,CKMBINDEX:3,TROPONINI:3 in the last 72 hours BNP: No components found with this basename: POCBNP:3 D-Dimer: No results found for this basename: DDIMER:2 in the last 72 hours Hemoglobin A1C: No results found for this basename: HGBA1C in the last 72 hours Fasting Lipid Panel: No results found for this basename: CHOL,HDL,LDLCALC,TRIG,CHOLHDL,LDLDIRECT in the last  72 hours Thyroid Function Tests: No results found for this basename: TSH,T4TOTAL,FREET3,T3FREE,THYROIDAB in the last 72 hours  Physical exam:  Patient is oriented to person time and place. Affect is normal. Cardiac exam reveals crisp closure sound of her mitral prosthesis. She has scattered rhonchi.   TELEMETRY: I reviewed telemetry. It appears that she has a junctional rhythm at this time with a rate of 85.   ASSESSMENT AND PLAN:   *S/P mitral valve replacement   Patient is recovering status post mitral valve replacement   Atrial fibrillation  The patient had a Maze procedure with her surgery. She is on amiodarone. She's not had any recurrent significant rapid atrial fib.   S/P Maze operation for atrial fibrillation   Junctional rhythm   On telemetry the rhythm appears to be junctional today with a rate of approximately 85. The only med that she is getting at this time that could affect her rhythm is amiodarone. She did have rapid atrial fibrillation that had to be cardioverted before her surgery. However she has had a Maze procedure. I will get an EKG to be sure we can see all leads to make sure this is junctional. However assuming it is, the options would be to  either continue to watch the rhythm or to stop her amiodarone. I would favor stopping her amiodarone. However I will wait for input from the surgical team.    Willa Rough 01/29/2012 7:12 AM

## 2012-01-30 ENCOUNTER — Inpatient Hospital Stay (HOSPITAL_COMMUNITY): Payer: Medicare Other

## 2012-01-30 LAB — PROTIME-INR: Prothrombin Time: 25.3 seconds — ABNORMAL HIGH (ref 11.6–15.2)

## 2012-01-30 NOTE — Progress Notes (Addendum)
Subjective:   [name] [name] complains of pain this morning.  She states that she can feel the chest tubes moving around in her chest.    Objective:  Vital Signs in the last 24 hours: Temp:  [97.7 F (36.5 C)-98.6 F (37 C)] 97.7 F (36.5 C) (04/13 0348) Pulse Rate:  [87-88] 88  (04/13 0348) Resp:  [18] 18  (04/13 0348) BP: (123-132)/(76-82) 123/79 mmHg (04/13 0348) SpO2:  [98 %-100 %] 100 % (04/13 0348) Weight:  [156 lb 15.5 oz (71.2 kg)] 156 lb 15.5 oz (71.2 kg) (04/13 0348)  Intake/Output from previous day: 04/12 0701 - 04/13 0700 In: 483 [P.O.:480; I.V.:3] Out: 350 [Chest Tube:350] Intake/Output from this shift:    Physical Exam: General appearance: alert, cooperative and no distress Lungs: clear to auscultation bilaterally Heart: regular rate and rhythm Abdomen: soft, non-tender; bowel sounds normal; no masses,  no organomegaly Extremities: edema 1+ Skin: incisions C/D/I  Lab Results:  Basename 01/29/12 0500 01/28/12 0415  WBC 13.4* 16.9*  HGB 12.5 13.2  PLT 154 139*    Basename 01/29/12 0500 01/28/12 0415  NA 133* 135  K 4.0 4.4  CL 99 102  CO2 21 23  GLUCOSE 107* 129*  BUN 30* 28*  CREATININE 0.77 1.09   No results found for this basename: TROPONINI:2,CK,MB:2 in the last 72 hours Hepatic Function Panel No results found for this basename: PROT,ALBUMIN,AST,ALT,ALKPHOS,BILITOT,BILIDIR,IBILI in the last 72 hours No results found for this basename: CHOL in the last 72 hours No results found for this basename: PROTIME in the last 72 hours  Imaging: Imaging results have been reviewed: chest tubes remain in place on right and left, no evidence of pneumothorax, + bilateral pleural effusions  Assessment/Plan:   1. S/P MINI MVR/MAZE 2. CV- patient remains in NSR with 1 AV Block, will continue to hold Beta Blocker, will continue Amiodarone 3. Chest tubes- left pleural chest tube had 100cc of drainage in last 24 hours, will d/c this tube.  Right sided chest tubes  had 250cc for 24 hour output will leave these in place and hopefully remove tomorrow 4. Resp- patient remains on oxygen with sats running 98-100%, will wean off today 5. Constipation- patient has not had a BM since surgery, she is passing flatus and is requesting senokot, but wishes to wait another day before trying a laxative 6. INR- results pending, will continue coumadin at 2.5mg  nightly, if INR result elevated will make adjustments accordingly 7. Dispo- patient doing well.  Will aim for discharge to SNF on Monday    LOS: 14 days    BARRETT, ERIN 01/30/2012, 7:44 AM    patient examined and medical record reviewed,agree with above note.Remove L tube. VAN TRIGT III,[name] [name] 01/30/2012

## 2012-01-30 NOTE — Progress Notes (Signed)
Removed chest tubes on the left side and tied sutures. Applied Vaseline guaze, guaze, and hyperfix tape over the site. Patient tolerated well, will continue to monitor. Lajuana Matte, RN

## 2012-01-30 NOTE — Progress Notes (Signed)
CARDIAC REHAB PHASE I   PRE:  Rate/Rhythm: 92SR  BP:  Supine: 103/81  Sitting:   Standing:    SaO2: 100% 2L  MODE:  Ambulation: 550 ft   POST:  Rate/Rhythem: 108ST  BP:  Supine:   Sitting:   Standing:    SaO2: 100%2L 1125-1200 Pt walked 550 ft on 2L with rolling walker and asst x 2. Pt pushed wheelchair. Took several standing rest breaks to catch her breath. Proud of her distance. Changed pt's depends and cleaned bottom upon return to room before going back to bed and BP not taken. No c/o dizziness. Staff in to pull chest tube and pws. Pt tolerated walk well and positive reenforcement given.    Duanne Limerick

## 2012-01-30 NOTE — Progress Notes (Signed)
Patient ambulated in the hallway 350 feet with wheelchair. Patient tolerated well with two brief stops, no SOB. Returned patient to bed. Patient complains of left arm swelling. Changed patient dressing on right side, saturated with serosanguinous fluid. Lajuana Matte, RN

## 2012-01-30 NOTE — Progress Notes (Signed)
Changed patient's dressing on right side, saturated with serosanguinous fluid. Patient tolerated well, will continue to monitor. Lajuana Matte, RN

## 2012-01-31 ENCOUNTER — Inpatient Hospital Stay (HOSPITAL_COMMUNITY): Payer: Medicare Other

## 2012-01-31 LAB — PROTIME-INR
INR: 2.36 — ABNORMAL HIGH (ref 0.00–1.49)
Prothrombin Time: 26.2 seconds — ABNORMAL HIGH (ref 11.6–15.2)

## 2012-01-31 MED ORDER — LEVALBUTEROL HCL 0.63 MG/3ML IN NEBU
0.6300 mg | INHALATION_SOLUTION | Freq: Four times a day (QID) | RESPIRATORY_TRACT | Status: AC
  Administered 2012-01-31 – 2012-02-02 (×9): 0.63 mg via RESPIRATORY_TRACT
  Filled 2012-01-31 (×14): qty 3

## 2012-01-31 MED ORDER — SODIUM CHLORIDE 0.9 % IJ SOLN
10.0000 mL | Freq: Two times a day (BID) | INTRAMUSCULAR | Status: DC
  Administered 2012-01-31 – 2012-02-01 (×6): 10 mL

## 2012-01-31 MED ORDER — SODIUM CHLORIDE 0.9 % IJ SOLN
10.0000 mL | INTRAMUSCULAR | Status: DC | PRN
  Administered 2012-02-02 (×2): 10 mL
  Administered 2012-02-03: 20 mL
  Administered 2012-02-03: 10 mL

## 2012-01-31 NOTE — Progress Notes (Signed)
Patient refused her walk this morning, she stated that she wanted to rest after her wash up. Lajuana Matte, RN

## 2012-01-31 NOTE — Progress Notes (Addendum)
Subjective:   [name] [name] states she feels better again today.  She states she walked 900 ft yesterday and was amazed at how good she felt afterwards.  Objective:  Vital Signs in the last 24 hours: Temp:  [97.5 F (36.4 C)-98 F (36.7 C)] 98 F (36.7 C) (04/14 0644) Pulse Rate:  [92-101] 92  (04/14 0644) Resp:  [16-19] 19  (04/14 0644) BP: (111-126)/(71-85) 112/75 mmHg (04/14 0644) SpO2:  [100 %] 100 % (04/14 0644) Weight:  [152 lb 1.9 oz (69 kg)] 152 lb 1.9 oz (69 kg) (04/14 0644)  Intake/Output from previous day: 04/13 0701 - 04/14 0700 In: 720 [P.O.:720] Out: 830 [Urine:200; Chest Tube:630] Intake/Output from this shift:    Physical Exam: General appearance: alert, cooperative and no distress Lungs: wheezes bilaterally and on inhalation Heart: regular rate and rhythm Abdomen: soft, non-tender; bowel sounds normal; no masses,  no organomegaly Extremities: edema 1+ Skin: incisions clean, chest tube site on right with soaked dressing  Lab Results:  Basename 01/29/12 0500  WBC 13.4*  HGB 12.5  PLT 154    Basename 01/29/12 0500  NA 133*  K 4.0  CL 99  CO2 21  GLUCOSE 107*  BUN 30*  CREATININE 0.77   No results found for this basename: TROPONINI:2,CK,MB:2 in the last 72 hours Hepatic Function Panel No results found for this basename: PROT,ALBUMIN,AST,ALT,ALKPHOS,BILITOT,BILIDIR,IBILI in the last 72 hours No results found for this basename: CHOL in the last 72 hours No results found for this basename: PROTIME in the last 72 hours  Imaging: Imaging results have been reviewed: small left sided pneumothorax, stable from previous film, right sided chest tubes remain in place  Assessment/Plan:   1. S/P Mini MVR/MAZE 2. CV- NSR with 1 AV Block, patients HR in the 90s, blood pressure controlled, patient currently on Amiodarone 3. Resp- new inspiratory wheezing, will order Xopenex nebulizer treatments and encouraged IS 4. Chest tube- patient's right sided chest tube  continue to have increased output, 24 hour total was about 400cc, will leave in place today 5. INR- result pending this morning, yesterdays value was 2.26, will continue 2.5mg  nightly 6. Fluid Balance- patient remains edematous, her weight is up 5kg since admission, will continue diuresis 7. Dispo-  The patient is stable at this time,  We will transfer her out of stepdown.  Hopefully be able to d/c chest tubes tomorrow, will aim for d/c to SNF possibly Tuesday   LOS: 15 days    [name], [name] 01/31/2012, 8:15 AM   Sig R chest tube output persists- will leave in place to water seal, otherwise progressing well- preop albumin 2.5

## 2012-02-01 ENCOUNTER — Inpatient Hospital Stay (HOSPITAL_COMMUNITY): Payer: Medicare Other

## 2012-02-01 LAB — URINALYSIS, ROUTINE W REFLEX MICROSCOPIC
Bilirubin Urine: NEGATIVE
Hgb urine dipstick: NEGATIVE
Nitrite: POSITIVE — AB
Specific Gravity, Urine: 1.011 (ref 1.005–1.030)
pH: 5.5 (ref 5.0–8.0)

## 2012-02-01 LAB — URINE MICROSCOPIC-ADD ON

## 2012-02-01 MED ORDER — FUROSEMIDE 10 MG/ML IJ SOLN
40.0000 mg | Freq: Four times a day (QID) | INTRAMUSCULAR | Status: AC
  Administered 2012-02-01 – 2012-02-02 (×3): 40 mg via INTRAVENOUS
  Filled 2012-02-01 (×3): qty 4

## 2012-02-01 MED ORDER — FUROSEMIDE 40 MG PO TABS
40.0000 mg | ORAL_TABLET | Freq: Two times a day (BID) | ORAL | Status: DC
  Administered 2012-02-02 – 2012-02-03 (×3): 40 mg via ORAL
  Filled 2012-02-01 (×5): qty 1

## 2012-02-01 NOTE — Progress Notes (Signed)
CARDIAC REHAB PHASE I   PRE:  Rate/Rhythm: 98 SR  BP:  Supine:   Sitting: 100/60  Standing:    SaO2: 100 RA  MODE:  Ambulation: 634 ft   POST:  Rate/Rhythem: 112  BP:  Supine:   Sitting: 100/64  Standing:    SaO2: 99 RA [id number] Assisted X 1 and used walker to ambulate. Gait fairly steady with walker, pt has awkward gait her feet turn outward. Pt tires easily and took several standing rest stops. VS stable Some DOE, but RA sats good. Pt to side of bed after walk with call light in reach.  Beatrix Fetters

## 2012-02-01 NOTE — Progress Notes (Addendum)
Subjective:   [name] [name] complains of swelling this morning.  She states that her left arm is sore and swollen.  She states she expected this in her right arm due to history of lymph node removal.  Objective:  Vital Signs in the last 24 hours: Temp:  [98.1 F (36.7 C)-98.9 F (37.2 C)] 98.3 F (36.8 C) (04/15 0641) Pulse Rate:  [90-92] 92  (04/15 0641) Resp:  [18-20] 19  (04/15 0641) BP: (105-123)/(70-75) 115/75 mmHg (04/15 0641) SpO2:  [97 %-100 %] 100 % (04/15 0641) Weight:  [151 lb 0.2 oz (68.5 kg)] 151 lb 0.2 oz (68.5 kg) (04/15 0641)  Intake/Output from previous day: 04/14 0701 - 04/15 0700 In: 240 [P.O.:240] Out: 690 [Urine:400; Chest Tube:290] Intake/Output from this shift:    Physical Exam: General appearance: alert, cooperative and no distress Lungs: wheezes bilaterally Heart: regular rate and rhythm Abdomen: soft, non-tender; bowel sounds normal; no masses,  no organomegaly Extremities: LUE area of edema, hard to palpation, warm to touch, no erythema present Skin: incision clean and dry, chest tube site continues to saturate dressing  Lab Results: No results found for this basename: WBC:2,HGB:2,PLT:2 in the last 72 hours No results found for this basename: NA:2,K:2,CL:2,CO2:2,GLUCOSE:2,BUN:2,CREATININE:2 in the last 72 hours No results found for this basename: TROPONINI:2,CK,MB:2 in the last 72 hours Hepatic Function Panel No results found for this basename: PROT,ALBUMIN,AST,ALT,ALKPHOS,BILITOT,BILIDIR,IBILI in the last 72 hours No results found for this basename: CHOL in the last 72 hours No results found for this basename: PROTIME in the last 72 hours  Assessment/Plan:   1. S/P Mini MVR/MAZE 2. CV- NSR with 1 AV Block, Tachy with good blood pressure control, patient is on Amiodarone 200mg  BID 3. Resp- wheezing has improved, will continue Xopenex  4. Chest tube- patient continues to have increased output, 24hr total was 370cc, she also continues to saturate  her dressing 5. INR-2.27, will continue coumadin 2.5mg  daily 6. LUE edema- concerning for DVT, patient had IV placed on admission which has since been removed, patient currently on Coumadin will discuss need for LUE duplex with staff 7. Fluid Balance- patients weight up 4kg since admission, some edema on exam, will continue diuresis 8. Dispo- patient with chest tube in place, continues to have increased drainage, currently on suction, will discuss further management with Dr. Cornelius Moras.  Once chest tube removed patient would be ready for d/c to SNF  LOS: 16 days    [name] [name] 02/01/2012, 7:55 AM    I have seen and examined the patient and agree with the assessment and plan as outlined.  Still volume overloaded - needs increased lasix.  I suspect that pleural fluid output will decrease when volume overload improved.  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  Presumably this may be related to old left subclavian central venous port.  She is and will need to remain therapeutic on coumadin.  Treatment is elevation of left arm.  Oleg Oleson [name] 02/01/2012 9:36 AM

## 2012-02-01 NOTE — Progress Notes (Signed)
CSW reviewed chart and staffed pt with RNCM. Note pt with chest tube presently. Will continue to follow for SNF placement when pt is medically ready.  Baxter Flattery, MSW [phone]

## 2012-02-01 NOTE — Progress Notes (Signed)
PT Cancellation Note  Treatment cancelled today due to pt declining mobility at this time stating she just woke up, was too hot, uncomfortable, and wanted to eat.  Will try another time.     Sunny Schlein, [place] [phone] 02/01/2012, 1:46 PM

## 2012-02-02 ENCOUNTER — Inpatient Hospital Stay (HOSPITAL_COMMUNITY): Payer: Medicare Other

## 2012-02-02 DIAGNOSIS — R000 Tachycardia, unspecified: Secondary | ICD-10-CM

## 2012-02-02 LAB — PROTIME-INR
INR: 2.15 — ABNORMAL HIGH (ref 0.00–1.49)
Prothrombin Time: 24.4 seconds — ABNORMAL HIGH (ref 11.6–15.2)

## 2012-02-02 LAB — CBC
HCT: 37.8 % (ref 36.0–46.0)
MCHC: 32.8 g/dL (ref 30.0–36.0)
RDW: 15.9 % — ABNORMAL HIGH (ref 11.5–15.5)
WBC: 11.3 10*3/uL — ABNORMAL HIGH (ref 4.0–10.5)

## 2012-02-02 LAB — BASIC METABOLIC PANEL
BUN: 16 mg/dL (ref 6–23)
Chloride: 92 mEq/L — ABNORMAL LOW (ref 96–112)
Creatinine, Ser: 0.68 mg/dL (ref 0.50–1.10)
GFR calc Af Amer: >90 mL/min (ref >90)
GFR calc non Af Amer: >90 mL/min (ref >90)
Potassium: 4.1 mEq/L (ref 3.5–5.1)

## 2012-02-02 MED ORDER — OXYCODONE HCL 5 MG PO TABS: 5.0000 mg | ORAL_TABLET | ORAL | Status: AC | PRN

## 2012-02-02 MED ORDER — POTASSIUM CHLORIDE CRYS ER 20 MEQ PO TBCR: 20.0000 meq | EXTENDED_RELEASE_TABLET | Freq: Two times a day (BID) | ORAL | Status: DC

## 2012-02-02 MED ORDER — AMIODARONE HCL 200 MG PO TABS: 200.0000 mg | ORAL_TABLET | Freq: Two times a day (BID) | ORAL | Status: DC

## 2012-02-02 MED ORDER — ENSURE COMPLETE PO LIQD: 237.0000 mL | Freq: Three times a day (TID) | ORAL | Status: DC

## 2012-02-02 MED ORDER — FUROSEMIDE 40 MG PO TABS: 40.0000 mg | ORAL_TABLET | Freq: Two times a day (BID) | ORAL | Status: DC

## 2012-02-02 MED ORDER — TRAMADOL HCL 50 MG PO TABS: 50.0000 mg | ORAL_TABLET | ORAL | Status: AC | PRN

## 2012-02-02 MED ORDER — METOPROLOL TARTRATE 12.5 MG HALF TABLET: 12.5000 mg | ORAL_TABLET | Freq: Two times a day (BID) | ORAL | Status: DC

## 2012-02-02 MED ORDER — WARFARIN SODIUM 2.5 MG PO TABS: 2.5000 mg | ORAL_TABLET | Freq: Every day | ORAL | Status: DC

## 2012-02-02 MED ORDER — METOPROLOL TARTRATE 12.5 MG HALF TABLET
12.5000 mg | ORAL_TABLET | Freq: Two times a day (BID) | ORAL | Status: DC
  Administered 2012-02-02 – 2012-02-03 (×3): 12.5 mg via ORAL
  Filled 2012-02-02 (×4): qty 1

## 2012-02-02 NOTE — Progress Notes (Signed)
PT Cancellation Note  Treatment cancelled today due to patient's refusal to participate. 1st attempt, pt was fatigued, and second attempt, she had just had chest tube removed. Will reattempt tomorrow.  Thank you.   Virl Cagey, [place] [phone]  02/02/2012, 3:36 PM

## 2012-02-02 NOTE — Progress Notes (Signed)
R chest tube removed per MD order and protocol. Occlusive dressing applied, Pt tolerated procedure well. PA notified and no x ray orders at this time. CXR ordered for tomorrow am. Will continue to monitor.

## 2012-02-02 NOTE — Discharge Summary (Addendum)
Physician Discharge Summary  Patient ID: [name] [name] MRN: [id number] DOB/AGE: 1951-06-27 61 y.o.  Admit date: 01/16/2012 Discharge date: 02/03/2012  Admission Diagnoses:  Patient Active Problem List  Diagnoses  . HYPOTHYROIDISM  . Multiple sclerosis  . MITRAL REGURGITATION  . NEUROGENIC BLADDER  . ROSACEA  . OSTEOPENIA  . URINARY INCONTINENCE  . BREAST CANCER, HX OF  . COLONOSCOPY, HX OF  . GENITAL HERPES, HX OF  . CHF (congestive heart failure)  . Anxiety  . Tachycardia  . Pulmonary hypertension  . Hypoxemia  . Hypokalemia  . COPD with emphysema  . Atrial fibrillation     Patient Active Problem List  Diagnoses  . HYPOTHYROIDISM  . Multiple sclerosis  . MITRAL REGURGITATION  . NEUROGENIC BLADDER  . ROSACEA  . OSTEOPENIA  . URINARY INCONTINENCE  . BREAST CANCER, HX OF  . COLONOSCOPY, HX OF  . GENITAL HERPES, HX OF  . CHF (congestive heart failure)  . Anxiety  . Tachycardia  . Pulmonary hypertension  . Hypoxemia  . Hypokalemia  . COPD with emphysema  . Atrial fibrillation  . S/P mitral valve replacement  . S/P Maze operation for atrial fibrillation  . First Degree Heart Block   Discharged Condition: good  Hospital Course:   Ms. Haseley is a 61 yo white female with history of breast cancer and thyroid disease who presented to the Emergency Department with a complaint of acute onset severe shortness of breath.  This had been occurring for one day.  However the patient states that she has been getting progressively more fatigued over the past 10 days.  The patient also complained of a non-productive cough for the past week.  The patient stated that when she exhales she heard a crunchy sound.  She also states that she is unable to lay flat.  The patient denied fevers, chills, or sweats.  Workup obtained in the ED suggested the patient to be suffering from CHF.  Cardiology was consulted and evaluated the patient who admits that she was told she had mitral  regurgitation in the past as an incidental finding.  Based on Cardiology evaluation it was felt the patient was likely suffering from acute non decompensated heart failure.  She was subsequently admitted for CHF and to undergo further medical workup.  HD #1 the patient underwent TTE which revealed a preserved EF of 60% and severe mitral regurgitation with a probable flail of her posterior leaflet and pulmonary hypertension.  HD #2 Pulmonary consulted for evaluation of the patient's pulmonary hypertension.  HD #3 pulmonary evaluated the patient and felt her pulmonary hypertension was likely multifactorial.  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  The patient also underwent cardiac catheterization which did not reveal any evidence of CAD.  Cardiothoracic surgery was consulted for possible Mitral Valve surgery.  Dr. Cornelius Moras evaluated the patient and felt that she would need to undergo Mitral Valve surgery.  He also requested CTA chest to rule out possible Aortic Aneurysm because the patient exhibits suggestive of an underlying collagen disorder.   HD #5 the patient underwent CTA of chest which did not reveal any evidence of aortic aneurysm or dissection.  The patient was found to have bilateral pleural effusions.  Pulmonary cleared the patient to proceed with Mitral Valve surgery.  The patient developed SVT vs. Atrial Flutter.  She was treated accordingly with IV Adenosine  which did convert the patient to NSR.  At that point it was clear the patient was suffering from Rapid Atrial Fibrillation.  She underwent cardioversion with subsequent conversion to NSR.  She remained on IV Amiodarone.  HD #6 Dr. Cornelius Moras again evaluated the patient and felt that she would be a candidate for minimally invasive mitral valve repair and MAZE procedure.  The risks and benefits were explained to the patient  and she wished to proceed with surgery. HD #7 the patient was started on Spiriva by pulmonary for COPD.  HD #8 the patient was placed on Nesiritide for more aggressive diruesis.  HD #9  Dr. Cornelius Moras again spoke with the patient regarding risks and benefits of the surgery.  All of her questions were answered and she was willing to proceed.  HD #10  The patient was taken to the operating room and underwent  Minimally Invasive Mitral Valve replacement utilizing a 31mm Sorin [employer] mechanical valve prosthesis, complete MAZE procedure, and left sided chest tube placement for pleural effusion.  The patient tolerated the procedure well and was taken to the Surgical ICU in stable condition.  POD #1 the patient was extubated.  The patient was weaned off her milrinone drip.  She was placed on Coumadin for mechanical valve anticoagulation.  Her arterial lines were removed.  POD #2  The patient remained on amiodarone for atrial fibrillation prophylaxis.  She continued to be atrial paced.  She was medically stable and transferred to the step down unit.  POD #3  The patient developed first degree heart block.  Her pacing wires were removed without difficulty.  POD #4 the patient's left sided chest tube was removed without difficulty.  POD #5 the patient's medically stable.  She was transferred to the telemetry unit.  The patients chest xray did not reveal any evidence of a left sided pneumothorax.  POD #6  The patient has LUE edema, felt most likely to be associated with possible central venous obstruction of left subclavian and/or internal jugular veins.  POD #7 the patients right sided chest tubes were removed. The patient was started on low dose Beta Blocker for tachycardia.  POD #8 the patient is doing well. Her chest xray did not reveal any evidence on pneumothorax or pleural effusion.  Her INR is 2.35 this morning and she will be discharged on 2.5mg  daily.  Please keep her INR 2.5-3.5.  She will follow up with Dr. Cornelius Moras on  02/22/2012 at 4:30 and should have a chest xray on 02/19/2012.  Disposition: 01-Home or Self Care  Discharge Orders    Future Appointments: Provider: Department: Dept Phone: Center:   02/22/2012 4:30 PM Purcell Nails, MD [facility] [phone]     Medication List  As of 02/03/2012  8:07 AM   TAKE these medications         amiodarone 200 MG tablet   Commonly known as: PACERONE   Take 1 tablet (200 mg total) by mouth 2 (two) times daily.      feeding supplement Liqd   Take 237 mLs by mouth 3 (three) times daily with meals.      furosemide 40 MG tablet   Commonly known as: LASIX   Take 1 tablet (40 mg total) by mouth 2 (two) times daily.      metoprolol tartrate 12.5 mg Tabs   Commonly known as: LOPRESSOR   Take 0.5 tablets (12.5 mg total) by mouth 2 (two) times daily.      oxyCODONE  5 MG immediate release tablet   Commonly known as: Oxy IR/ROXICODONE   Take 1-2 tablets (5-10 mg total) by mouth every 4 (four) hours as needed.      potassium chloride SA 20 MEQ tablet   Commonly known as: K-DUR,KLOR-CON   Take 1 tablet (20 mEq total) by mouth 2 (two) times daily.      PROGESTERONE MICRONIZED PO   Take 25 mg by mouth 2 (two) times daily.      thyroid 60 MG tablet   Commonly known as: ARMOUR   Take 60 mg by mouth daily.      traMADol 50 MG tablet   Commonly known as: ULTRAM   Take 1-2 tablets (50-100 mg total) by mouth every 4 (four) hours as needed.      warfarin 2.5 MG tablet   Commonly known as: COUMADIN   Take 1 tablet (2.5 mg total) by mouth daily at 6 PM.           Follow-up Information    Follow up with Purcell Nails, MD on 02/22/2012. (Appointment time is 4:30pm)    Contact information:   [address] [place] [address] [phone]       Follow up with [facility] on 02/19/2012. (Please have chest xray performed, appointment is as a walk in)       Follow up with Willa Rough, MD. Schedule an appointment as soon  as possible for a visit in 2 weeks.   Contact information:   [address] [place] [address] [phone]       Follow up with Shan Levans, MD. Schedule an appointment as soon as possible for a visit in 4 weeks.   Contact information:   [address] [facility] [address] [place] [address] [phone]          Signed: Lowella Dandy 02/03/2012, 8:07 AM

## 2012-02-02 NOTE — Progress Notes (Signed)
CARDIAC REHAB PHASE I   PRE:  Rate/Rhythm: 91SR  BP:  Supine: 100/70  Sitting:   Standing:    SaO2: 99%RA  MODE:  Ambulation: 800 ft   POST:  Rate/Rhythem: 105  BP:  Supine:   Sitting:   Standing:    SaO2: 92%RA 1100-1135 Pt walked 800 ft on RA with rolling walker and asst x 1. Gait steady. To BSC prior to walk. Pt took a few standing rest breaks but tolerated well. Back to bed after walk. To get chest tube out today.  Duanne Limerick

## 2012-02-02 NOTE — Discharge Summary (Signed)
I agree with the above discharge summary and plan for follow-up.  Salisa Broz H  

## 2012-02-02 NOTE — Discharge Instructions (Signed)
Mitral Valve Replacement Care After  Refer to this sheet in the next few weeks. These instructions provide you with information on caring for yourself after your procedure. Your caregiver may also give you specific instructions. Your treatment has been planned according to current medical practices, but problems sometimes occur. Call your caregiver if you have any problems or questions after your procedure.  HOME CARE INSTRUCTIONS   Only take over-the-counter or prescription medicines for pain, fever, or discomfort as directed by your caregiver.   Take your temperature every morning for the first week after surgery. Write these down.   Weigh yourself every morning for at least the first week after surgery and record.   Do not lift more than 10 lb (4.5 kg) until your breastbone (sternum) has healed. Avoid all activities which would place strain on your surgical cut (incision).   You may shower. Do not take baths until instructed by your surgeon. Pat incisions dry. Do not rub incisions with washcloth or towel.   Avoid driving for 4 to 6 weeks after surgery or as instructed.   Use your elastic stockings during the day. You should wear the stockings for at least 2 weeks after discharge or longer if your ankles are swollen. The stockings help blood flow and help reduce swelling in the legs. It is easiest to put the stockings on before you get out of bed in the morning. They should be snug.   Some individuals with a mitral valve replacement need to take antibiotics before having dental work or other surgical procedures. This is called prophylactic antibiotic treatment. These drugs help to prevent infective endocarditis. Antibiotics are only recommended for individuals with the highest risk for developing infective endocarditis. Let your dentist and your caregiver know if you have a history of any of the following so that the necessary precautions can be taken:   A VSD.   A repaired VSD.    Endocarditis in the past.   An artificial (prosthetic) heart valve.  Pain Control  You may feel some chest, shoulder, or abdominal discomfort. It is caused by the absorption of air through the chest wall.   If a prescription was given for a pain reliever, follow your surgeon's directions.   If the pain is not relieved by your medicine, becomes worse, or you have difficulty breathing, call your surgeon.  Activity  Take frequent rest periods throughout the day.   Wait 1 week before returning to strenuous activities such as heavy lifting (more than 10 pounds), pushing, or pulling.   Talk with your doctor about when you may return to work and your exercise routine.   Do not drive while taking prescription pain medication.  Nutrition  You may resume your normal diet as instructed.   Eat a well-balanced diet.  Elimination Your normal bowel function should return. If constipation should occur, you may:  Take a mild laxative as directed by your caregiver.   Add fruit and bran to your diet.   Drink enough fluids to keep your urine clear or pale yellow.   Call your surgeon if constipation is not relieved.  SEEK IMMEDIATE MEDICAL CARE IF:   You develop chest pain which is not coming from your incision.   You develop shortness of breath or have difficulty breathing.   You develop a temperature over 102 F (38.9 C).   You have a sudden weight gain. Let your doctor know what the weight gain is.   You develop a rash.  You develop any reaction or side effects to medications given.   You have increased bleeding from wounds.   You see redness, swelling, or have increasing pain in wounds.   You have yellowish white fluid (pus) coming from your wound.   You feel lightheaded or feel faint.   You feel sick to your stomach (nauseous) or throw up (vomit).  Document Released: 04/24/2005 Document Revised: 09/24/2011 Document Reviewed: 10/05/2005 [facility] Patient Information  2012 [place], [place].

## 2012-02-02 NOTE — Progress Notes (Signed)
Patient ID: [name] [name], female   DOB: 1950-12-10, 61 y.o.   MRN: [id number]   SUBJECTIVE:  Patient continues to feel better and get stronger. She is maintaining sinus rhythm with first degree AV block. Initially she had marked prolongation of her first degree AV block. She had also had some bradycardia. Her beta blocker was stopped. Currently her heart rate is running in the 90 range despite the beta blocking effect of the amiodarone.   Filed Vitals:   02/01/12 1453 02/01/12 1946 02/01/12 2120 02/02/12 0517  BP:   98/63 119/76  Pulse:   98 108  Temp:   98.7 F (37.1 C) 98.1 F (36.7 C)  TempSrc:   Oral Oral  Resp:   18 18  Height:      Weight:    144 lb 9.6 oz (65.59 kg)  SpO2: 98% 100% 97% 99%    Intake/Output Summary (Last 24 hours) at 02/02/12 0930 Last data filed at 02/02/12 0700  Gross per 24 hour  Intake    600 ml  Output    290 ml  Net    310 ml    LABS: Basic Metabolic Panel:  Basename 02/02/12 0510  NA 137  K 4.1  CL 92*  CO2 38*  GLUCOSE 116*  BUN 16  CREATININE 0.68  CALCIUM 9.2  MG --  PHOS --   Liver Function Tests: No results found for this basename: AST:2,ALT:2,ALKPHOS:2,BILITOT:2,PROT:2,ALBUMIN:2 in the last 72 hours No results found for this basename: LIPASE:2,AMYLASE:2 in the last 72 hours CBC:  Basename 02/02/12 0510  WBC 11.3*  NEUTROABS --  HGB 12.4  HCT 37.8  MCV 93.3  PLT 292   Cardiac Enzymes: No results found for this basename: CKTOTAL:3,CKMB:3,CKMBINDEX:3,TROPONINI:3 in the last 72 hours BNP: No components found with this basename: POCBNP:3 D-Dimer: No results found for this basename: DDIMER:2 in the last 72 hours Hemoglobin A1C: No results found for this basename: HGBA1C in the last 72 hours Fasting Lipid Panel: No results found for this basename: CHOL,HDL,LDLCALC,TRIG,CHOLHDL,LDLDIRECT in the last 72 hours Thyroid Function Tests: No results found for this basename: TSH,T4TOTAL,FREET3,T3FREE,THYROIDAB in the last 72  hours  RADIOLOGY: Dg Chest 2 View  01/16/2012  *RADIOLOGY REPORT*  Clinical Data: Cough, congestion, fever, shortness of breath.  CHEST - 2 VIEW  Comparison: 02/24/2011  Findings: Severe COPD changes.  Patchy bilateral airspace opacities are noted, right greater than left with bilateral pleural effusions.  Heart size is upper limits normal.  Stable right apical pleural thickening.  Surgical clips in the right axilla and breast.  IMPRESSION: Severe COPD.  Bilateral airspace opacities, right greater than left with bilateral effusions.  Asymmetric edema versus infection.  Original Report Authenticated By: Cyndie Chime, M.D.   Ct Angio Chest W/cm &/or Wo Cm  01/21/2012  *RADIOLOGY REPORT*  Clinical Data:  Shortness of breath.  Rule out thoracic aortic aneurysm.  Rule out aorto occlusive disease.  CT ANGIOGRAPHY CHEST, ABDOMEN AND PELVIS  Technique:  Multidetector CT imaging through the chest, abdomen and pelvis was performed using the standard protocol during bolus administration of intravenous contrast.  Multiplanar reconstructed images including MIPs were obtained and reviewed to evaluate the vascular anatomy.  Contrast:  100 ml Omnipaque 300 IV.  Comparison:  None.  CTA CHEST  Findings:  There is tortuosity of the thoracic aorta which is nonaneurysmal.  Largest diameter is in the ascending aorta measuring 3.0 cm.  Aortic arch measures 2.4 cm.  Proximal descending thoracic aorta measures 2.3 cm.  Descending aorta at the aortic hiatus measures 2.1 cm.  Minimal calcified plaque in the aortic arch and distal descending thoracic aorta.  There are large bilateral pleural effusions.  Compressive atelectasis in the lower lobes bilaterally.  Heart is upper limits normal in size.  There are ground-glass opacities within the lungs bilaterally which could reflect edema or bronchiolitis.  Mildly enlarged pretracheal lymph node measures 13 mm in short axis diameter.  No hilar or axillary adenopathy.  No acute bony  abnormality. Calcifications noted in the proximal left anterior descending coronary artery.   Review of the MIP images confirms the above findings.  IMPRESSION: No evidence of aortic aneurysm or dissection.  Large bilateral pleural effusions.  Ground-glass opacities within the lungs could reflect edema or bronchiolitis/alveolitis.  Heart is borderline in size.  Coronary artery calcifications.  CTA ABDOMEN AND PELVIS  Findings:  No evidence of aortic aneurysm or dissection.  Scattered atherosclerotic calcifications in the infrarenal aorta and common iliac arteries.  Iliofemoral vessels are patent without stenosis. Celiac, superior mesenteric artery, inferior mesenteric artery and single renal arteries bilaterally are patent without focal stenosis.  Liver, spleen, pancreas, adrenals and kidneys are unremarkable.  Small amount of free fluid in the pelvis.  Uterus and adnexa as well as urinary bladder grossly unremarkable.  Large and small bowel are grossly unremarkable.  No evidence of obstruction.   Review of the MIP images confirms the above findings.  IMPRESSION: No evidence of aortic aneurysm or dissection. Atherosclerotic change in the distal aorta and common iliac arteries.  No acute findings in the abdomen or pelvis.  Original Report Authenticated By: Cyndie Chime, M.D.   Dg Chest Port 1 View  02/02/2012  *RADIOLOGY REPORT*  Clinical Data: Right pleural effusion.  PORTABLE CHEST - 1 VIEW  Comparison: 02/01/2012  Findings: Support devices remain in stable position.  No pneumothorax.  Stable right apical pleural thickening.  Prior valve replacement.  Heart is borderline in size.  Patchy airspace disease in the right lung and left base.  Suspect small effusions.  No real change.  IMPRESSION: No significant interval change.  Original Report Authenticated By: Cyndie Chime, M.D.   Dg Chest Port 1 View  02/01/2012  *RADIOLOGY REPORT*  Clinical Data: Right chest tubes, pleural effusion, increased shortness of  breath  PORTABLE CHEST - 1 VIEW  Comparison: Portable exam 0807 hours compared to 01/30/2029  Findings: Right jugular central venous catheter stable, tip projecting over SVC. Pair of the right thoracostomy tubes unchanged. Upper normal heart size post MVR. Mediastinal contours and pulmonary vascularity normal. Emphysematous changes with left basilar atelectasis and right apical pleural parenchymal scarring/thickening unchanged. Minimal peribronchial thickening. No definite pneumothorax identified. Bones demineralized. Surgical clips right axilla.  IMPRESSION: Right thoracostomy tubes without pneumothorax. No definite left apex pneumothorax identified.  Original Report Authenticated By: Lollie Marrow, M.D.   Dg Chest Port 1 View  01/31/2012  *RADIOLOGY REPORT*  Clinical Data: Removal of left chest tube.  PORTABLE CHEST - 1 VIEW  Comparison: 01/30/2012  Findings: The left chest tube has been removed.  Densities at the left lung base could represent atelectasis or pleural fluid.  There is no definite left pneumothorax but again seen is a small lucency at the left lung apex.  Again noted is a mitral valve replacement. The right chest drains are still in place.  There is not a large right pneumothorax.  Stable densities in the right lung apex. Heart size is stable.  Jugular central catheter overlying  the SVC.  IMPRESSION:  Removal of left chest tube without a large pneumothorax.  There may be a tiny amount of left apical pleural air which is unchanged. Left basilar densities as described.  Stable position of the remaining chest drains.  Original Report Authenticated By: Richarda Overlie, M.D.   Dg Chest Port 1 View  01/30/2012  *RADIOLOGY REPORT*  Clinical Data: Postop mitral valve replacement.  PORTABLE CHEST - 1 VIEW  Comparison: 01/29/2012 and 01/28/2012.  Findings: 0530 hours.  Right IJ central venous catheter and bilateral chest tubes remain in place.  A small left-sided pneumothorax remains with apical and medial  components adjacent to the heart.  There is no right-sided pneumothorax.  Bilateral pulmonary opacities are stable.  Heart size and mediastinal contours are stable.  IMPRESSION: Stable postoperative chest with a small residual left-sided pneumothorax.  Original Report Authenticated By: Gerrianne Scale, M.D.   Dg Chest Port 1 View  01/29/2012  *RADIOLOGY REPORT*  Clinical Data: 61 year old female status post cardiac valve replacement.  Left pneumothorax, chest tube.  PORTABLE CHEST - 1 VIEW  Comparison: 01/28/2012 and earlier.  Findings: Portable upright AP view at 0900 hours.  Bilateral chest tubes remain in place.  Small left apical pneumothorax is less apparent.  Small bilateral pleural effusions.  Right apical pleural/parenchymal scarring.  No right pneumothorax identified. Right IJ central line remains in place.  Epicardial pacer wires and mediastinal drain remain in place.  Cardiac valve replacement. Stable cardiac size and mediastinal contours.  No consolidation. Improved bibasilar ventilation.  IMPRESSION: 1. Stable lines and tubes.  Small left apical pneumothorax is less apparent. 2.  Small bilateral pleural effusions.  Interval improved bibasilar ventilation.  Original Report Authenticated By: Harley Hallmark, M.D.   Dg Chest Portable 1 View In Am  01/28/2012  *RADIOLOGY REPORT*  Clinical Data: Postop open heart surgery  PORTABLE CHEST - 1 VIEW  Comparison: Chest x-ray of 01/27/2012  Findings: The Swan-Ganz catheter has been removed with a venous sheath remaining in the SVC.  The left system remains in the does appear to be a tiny left apical pneumothorax present.  Right chest tubes are noted and no definite right pneumothorax is seen with right apical opacity unchanged.  Heart size is stable.  IMPRESSION: Swan-Ganz catheter removed.  Tiny left apical pneumothorax. Bilateral chest tubes remain.  Original Report Authenticated By: Juline Patch, M.D.   Dg Chest Portable 1 View In Am  01/27/2012   *RADIOLOGY REPORT*  Clinical Data: Postop heart surgery  PORTABLE CHEST - 1 VIEW  Comparison: Chest radiograph 01/26/2012  Findings: Interval extubation with no increase in atelectasis. There is some improvement in the air space opacity in the right mid lung.  Swan-Ganz catheter, mediastinal drain, and left chest tube are unchanged.  There is interval removal of NG tube.  Additionally there is a right chest tube.  IMPRESSION:  1.  Interval extubation without complication. 2.  Improved aeration in the right mid lung. 3.  Stable remaining support apparatus of pneumothorax.  Original Report Authenticated By: Genevive Bi, M.D.   Dg Chest Portable 1 View  01/26/2012  *RADIOLOGY REPORT*  Clinical Data: Mitral valve replacement procedure.  PORTABLE CHEST - 1 VIEW  Comparison: 01/22/2012  Findings: Endotracheal tube is approximately 2.4 cm above the carina. Patient has undergone a mitral valve replacement.  Ernestine Conrad- Ganz catheter in the proximal right main pulmonary artery. Evidence for bilateral chest tubes.  No evidence for a large pneumothorax.  Nasogastric tube extends into  the abdomen.  There is patchy airspace disease, right side greater than left.  Findings could represent edema.  Decreased pleural effusions compared to the prior examination.  There are epicardial pacer wires.  There is also a right jugular central venous catheter with tip in the SVC region. There appears be a chest drain overlying the cardiac silhouette.  IMPRESSION: Post mitral valve surgery.  Bilateral chest tubes without a large pneumothorax.  Decreased pleural effusions.  Bilateral airspace disease most likely represents asymmetric edema. Recommend attention to the airspace disease in the right lung on follow-up exams to exclude infection or aspiration.  Original Report Authenticated By: Richarda Overlie, M.D.   Dg Chest Port 1 View  01/22/2012  *RADIOLOGY REPORT*  Clinical Data: Shortness of breath  PORTABLE CHEST - 1 VIEW  Comparison: Chest  x-ray of 01/16/2012  Findings: The lungs are not well aerated and there has been worsening of pulmonary edema with bilateral effusions and cardiomegaly.  Surgical clips overlie the right axilla.  IMPRESSION: Worsening of congestive heart failure with poor aeration and bilateral pleural effusions.  Original Report Authenticated By: Juline Patch, M.D.   Ct Angio Abd/pel W/ And/or W/o  01/21/2012  *RADIOLOGY REPORT*  Clinical Data:  Shortness of breath.  Rule out thoracic aortic aneurysm.  Rule out aorto occlusive disease.  CT ANGIOGRAPHY CHEST, ABDOMEN AND PELVIS  Technique:  Multidetector CT imaging through the chest, abdomen and pelvis was performed using the standard protocol during bolus administration of intravenous contrast.  Multiplanar reconstructed images including MIPs were obtained and reviewed to evaluate the vascular anatomy.  Contrast:  100 ml Omnipaque 300 IV.  Comparison:  None.  CTA CHEST  Findings:  There is tortuosity of the thoracic aorta which is nonaneurysmal.  Largest diameter is in the ascending aorta measuring 3.0 cm.  Aortic arch measures 2.4 cm.  Proximal descending thoracic aorta measures 2.3 cm.  Descending aorta at the aortic hiatus measures 2.1 cm.  Minimal calcified plaque in the aortic arch and distal descending thoracic aorta.  There are large bilateral pleural effusions.  Compressive atelectasis in the lower lobes bilaterally.  Heart is upper limits normal in size.  There are ground-glass opacities within the lungs bilaterally which could reflect edema or bronchiolitis.  Mildly enlarged pretracheal lymph node measures 13 mm in short axis diameter.  No hilar or axillary adenopathy.  No acute bony abnormality. Calcifications noted in the proximal left anterior descending coronary artery.   Review of the MIP images confirms the above findings.  IMPRESSION: No evidence of aortic aneurysm or dissection.  Large bilateral pleural effusions.  Ground-glass opacities within the lungs could  reflect edema or bronchiolitis/alveolitis.  Heart is borderline in size.  Coronary artery calcifications.  CTA ABDOMEN AND PELVIS  Findings:  No evidence of aortic aneurysm or dissection.  Scattered atherosclerotic calcifications in the infrarenal aorta and common iliac arteries.  Iliofemoral vessels are patent without stenosis. Celiac, superior mesenteric artery, inferior mesenteric artery and single renal arteries bilaterally are patent without focal stenosis.  Liver, spleen, pancreas, adrenals and kidneys are unremarkable.  Small amount of free fluid in the pelvis.  Uterus and adnexa as well as urinary bladder grossly unremarkable.  Large and small bowel are grossly unremarkable.  No evidence of obstruction.   Review of the MIP images confirms the above findings.  IMPRESSION: No evidence of aortic aneurysm or dissection. Atherosclerotic change in the distal aorta and common iliac arteries.  No acute findings in the abdomen or pelvis.  Original Report Authenticated By: Cyndie Chime, M.D.    PHYSICAL EXAM  Patient is oriented to person time and place. Affect is normal. Lungs reveal only a few scattered rhonchi. Cardiac exam reveals crisp mitral prosthetic closure sound. No obvious murmurs heard.   TELEMETRY: There is sinus rhythm with a rate of 92. I have personally reviewed telemetry.   ASSESSMENT AND PLAN:   *S/P mitral valve replacement     Patient continues to improve. I appreciate the excellent care from the entire cardiothoracic team, especially Dr. Cornelius Moras.   HYPOTHYROIDISM Her thyroid functions have been checked this admission and they are in the normal range on her current therapy.    Tachycardia   Her resting heart rate is mildly increased. I will start a low dose of beta blocker back.    S/P Maze operation for atrial fibrillation     She is now holding sinus on amiodarone and this will be continued for now.    Willa Rough 02/02/2012 9:30 AM

## 2012-02-02 NOTE — Progress Notes (Addendum)
[address]            [place] [address]          [phone]     7 Days Post-Op  Procedure(s) (LRB): MAZE (N/A) MINIMALLY INVASIVE MITRAL VALVE (MV) REPLACEMENT (Right) CHEST TUBE INSERTION (Left) Subjective: Her arm feels better, less swelling with elevation.   Objective  Telemetry SR/ST  Temp:  [97.8 F (36.6 C)-98.7 F (37.1 C)] 98.1 F (36.7 C) (04/16 0517) Pulse Rate:  [98-108] 108  (04/16 0517) Resp:  [18] 18  (04/16 0517) BP: (98-119)/(62-76) 119/76 mmHg (04/16 0517) SpO2:  [97 %-100 %] 99 % (04/16 0517) Weight:  [144 lb 9.6 oz (65.59 kg)] 144 lb 9.6 oz (65.59 kg) (04/16 0517)   Intake/Output Summary (Last 24 hours) at 02/02/12 0730 Last data filed at 02/01/12 2100  Gross per 24 hour  Intake    960 ml  Output    200 ml  Net    760 ml       General appearance: alert, cooperative and no distress Heart: regular rate and rhythm, S1, S2 normal and crisp valve click Lungs: mildly diminished in bases Abdomen: soft, nontender Extremities: min LE edema, L arm swelling improved per patient report Wound: incisions healing well  Lab Results:  Basename 02/02/12 0510  NA 137  K 4.1  CL 92*  CO2 38*  GLUCOSE 116*  BUN 16  CREATININE 0.68  CALCIUM 9.2  MG --  PHOS --   No results found for this basename: AST:2,ALT:2,ALKPHOS:2,BILITOT:2,PROT:2,ALBUMIN:2 in the last 72 hours No results found for this basename: LIPASE:2,AMYLASE:2 in the last 72 hours  Basename 02/02/12 0510  WBC 11.3*  NEUTROABS --  HGB 12.4  HCT 37.8  MCV 93.3  PLT 292   No results found for this basename: CKTOTAL:4,CKMB:4,TROPONINI:4 in the last 72 hours No components found with this basename: POCBNP:3 No results found for this basename: DDIMER in the last 72 hours No results found for this basename: HGBA1C in the last 72 hours No results found for this basename: CHOL,HDL,LDLCALC,TRIG,CHOLHDL in the last 72 hours No results found for this basename:  TSH,T4TOTAL,FREET3,T3FREE,THYROIDAB in the last 72 hours No results found for this basename: VITAMINB12,FOLATE,FERRITIN,TIBC,IRON,RETICCTPCT in the last 72 hours  Medications: Scheduled    . amiodarone  200 mg Oral BID  . bisacodyl  10 mg Oral Daily   Or  . bisacodyl  10 mg Rectal Daily  . docusate sodium  200 mg Oral Daily  . feeding supplement  237 mL Oral TID WC  . furosemide  40 mg Intravenous Q6H  . furosemide  40 mg Oral BID  . levalbuterol  0.63 mg Nebulization Q6H  . pantoprazole  40 mg Oral Q1200  . potassium chloride  20 mEq Oral BID  . sodium chloride  10-40 mL Intracatheter Q12H  . sodium chloride  3 mL Intravenous Q12H  . thyroid  60 mg Oral QAC breakfast  . warfarin  2.5 mg Oral q1800  . Warfarin - Physician Dosing Inpatient   Does not apply q1800  . DISCONTD: furosemide  40 mg Oral BID     Radiology/Studies:  Dg Chest Port 1 View  02/01/2012  *RADIOLOGY REPORT*  Clinical Data: Right chest tubes, pleural effusion, increased shortness of breath  PORTABLE CHEST - 1 VIEW  Comparison: Portable exam 0807 hours compared to 01/30/2029  Findings: Right jugular central venous  catheter stable, tip projecting over SVC. Pair of the right thoracostomy tubes unchanged. Upper normal heart size post MVR. Mediastinal contours and pulmonary vascularity normal. Emphysematous changes with left basilar atelectasis and right apical pleural parenchymal scarring/thickening unchanged. Minimal peribronchial thickening. No definite pneumothorax identified. Bones demineralized. Surgical clips right axilla.  IMPRESSION: Right thoracostomy tubes without pneumothorax. No definite left apex pneumothorax identified.  Original Report Authenticated By: Lollie Marrow, M.D.   Dg Chest Port 1 View  01/31/2012  *RADIOLOGY REPORT*  Clinical Data: Removal of left chest tube.  PORTABLE CHEST - 1 VIEW  Comparison: 01/30/2012  Findings: The left chest tube has been removed.  Densities at the left lung base could  represent atelectasis or pleural fluid.  There is no definite left pneumothorax but again seen is a small lucency at the left lung apex.  Again noted is a mitral valve replacement. The right chest drains are still in place.  There is not a large right pneumothorax.  Stable densities in the right lung apex. Heart size is stable.  Jugular central catheter overlying the SVC.  IMPRESSION:  Removal of left chest tube without a large pneumothorax.  There may be a tiny amount of left apical pleural air which is unchanged. Left basilar densities as described.  Stable position of the remaining chest drains.  Original Report Authenticated By: Richarda Overlie, M.D.   Chest tube 190 cc out yesterday Wt 144 #   INR:2.15 Will add last result for INR, ABG once components are confirmed Will add last 4 CBG results once components are confirmed  Assessment/Plan: S/P Procedure(s) (LRB): MAZE (N/A) MINIMALLY INVASIVE MITRAL VALVE (MV) REPLACEMENT (Right) CHEST TUBE INSERTION (Left)  1. D/C chest tubes today 2. Some metabolic alkalosis ,. Lasix decreased  today, good diuresis. 3. Cont AC RX 4. Poss SNF soon  LOS: 17 days    GOLD,WAYNE E 4/16/20137:30 AM    I have seen and examined the patient and agree with the assessment and plan as outlined.  Tentatively for d/c to SNF tomorrow.  Will decrease lasix and KCl to daily dosages for d/c.  Recheck INR in am to decide on coumadin dose.  Brady Schiller H 02/02/2012 1:41 PM

## 2012-02-03 ENCOUNTER — Inpatient Hospital Stay (HOSPITAL_COMMUNITY): Payer: Medicare Other

## 2012-02-03 DIAGNOSIS — Z48812 Encounter for surgical aftercare following surgery on the circulatory system: Secondary | ICD-10-CM | POA: Diagnosis not present

## 2012-02-03 DIAGNOSIS — J984 Other disorders of lung: Secondary | ICD-10-CM | POA: Diagnosis not present

## 2012-02-03 DIAGNOSIS — E039 Hypothyroidism, unspecified: Secondary | ICD-10-CM | POA: Diagnosis not present

## 2012-02-03 DIAGNOSIS — G35 Multiple sclerosis: Secondary | ICD-10-CM | POA: Diagnosis not present

## 2012-02-03 DIAGNOSIS — R0602 Shortness of breath: Secondary | ICD-10-CM | POA: Diagnosis not present

## 2012-02-03 DIAGNOSIS — Z5189 Encounter for other specified aftercare: Secondary | ICD-10-CM | POA: Diagnosis not present

## 2012-02-03 DIAGNOSIS — Z79899 Other long term (current) drug therapy: Secondary | ICD-10-CM | POA: Diagnosis not present

## 2012-02-03 DIAGNOSIS — I4891 Unspecified atrial fibrillation: Secondary | ICD-10-CM | POA: Diagnosis not present

## 2012-02-03 DIAGNOSIS — R279 Unspecified lack of coordination: Secondary | ICD-10-CM | POA: Diagnosis not present

## 2012-02-03 DIAGNOSIS — J449 Chronic obstructive pulmonary disease, unspecified: Secondary | ICD-10-CM | POA: Diagnosis not present

## 2012-02-03 DIAGNOSIS — J90 Pleural effusion, not elsewhere classified: Secondary | ICD-10-CM | POA: Diagnosis not present

## 2012-02-03 DIAGNOSIS — R5381 Other malaise: Secondary | ICD-10-CM | POA: Diagnosis not present

## 2012-02-03 DIAGNOSIS — I2789 Other specified pulmonary heart diseases: Secondary | ICD-10-CM | POA: Diagnosis not present

## 2012-02-03 DIAGNOSIS — Q233 Congenital mitral insufficiency: Secondary | ICD-10-CM | POA: Diagnosis not present

## 2012-02-03 DIAGNOSIS — M6281 Muscle weakness (generalized): Secondary | ICD-10-CM | POA: Diagnosis not present

## 2012-02-03 DIAGNOSIS — R262 Difficulty in walking, not elsewhere classified: Secondary | ICD-10-CM | POA: Diagnosis not present

## 2012-02-03 DIAGNOSIS — Z09 Encounter for follow-up examination after completed treatment for conditions other than malignant neoplasm: Secondary | ICD-10-CM | POA: Diagnosis not present

## 2012-02-03 DIAGNOSIS — I509 Heart failure, unspecified: Secondary | ICD-10-CM | POA: Diagnosis not present

## 2012-02-03 LAB — PROTIME-INR: Prothrombin Time: 26.1 seconds — ABNORMAL HIGH (ref 11.6–15.2)

## 2012-02-03 MED ORDER — POLYETHYLENE GLYCOL 3350 17 G PO PACK
17.0000 g | PACK | Freq: Once | ORAL | Status: DC
  Filled 2012-02-03: qty 1

## 2012-02-03 NOTE — Progress Notes (Signed)
Pt has bed at [facility]. Packet is prepared and placed in [place]. RN advised and preparing pt for transfer.  CSW will d/c pt when FL2 is signed.   Baxter Flattery, MSW [phone]

## 2012-02-03 NOTE — Progress Notes (Signed)
CARDIAC REHAB PHASE I   PRE:  Rate/Rhythm: 86SR  BP:  Supine:   Sitting: 100/70  Standing:    SaO2: 100%RA  MODE:  Ambulation: 800 ft   POST:  Rate/Rhythem: 93  BP:  Supine:   Sitting: 118/60  Standing:    SaO2: 94%RA 1115-1152 Pt walked 800 ft with rolling walker with steady gait. Stopped several times to rest. Tolerated well. To bed after walk. Education completed. Permission given to refer to [facility] Phase 2. Linnet Bottari DunlapRN  Duanne Limerick

## 2012-02-03 NOTE — Progress Notes (Addendum)
8 Days Post-Op Procedure(s) (LRB): MAZE (N/A) MINIMALLY INVASIVE MITRAL VALVE (MV) REPLACEMENT (Right) CHEST TUBE INSERTION (Left) Subjective:  [name] [name] has no complaints this morning.  She states she thinks she is doing very well and is hoping to be discharged to rehab today  Objective: Vital signs in last 24 hours: Temp:  [98.1 F (36.7 C)-98.4 F (36.9 C)] 98.1 F (36.7 C) (04/17 0529) Pulse Rate:  [100-110] 101  (04/17 0529) Cardiac Rhythm:  [-] Heart block (04/16 1930) Resp:  [18] 18  (04/17 0529) BP: (104-114)/(61-74) 114/74 mmHg (04/17 0529) SpO2:  [93 %-99 %] 93 % (04/17 0529) Weight:  [146 lb 9.6 oz (66.497 kg)] 146 lb 9.6 oz (66.497 kg) (04/17 0529)   Intake/Output from previous day: 04/16 0701 - 04/17 0700 In: 600 [P.O.:600] Out: -     General appearance: alert, cooperative and no distress Neurologic: intact Heart: regular rate and rhythm Lungs: clear to auscultation bilaterally Abdomen: soft, non-tender; bowel sounds normal; no masses,  no organomegaly Extremities: edema 1+ Wound: clean and dry, healing with no evidence of infectious process  Lab Results:  [facility] 02/02/12 0510  WBC 11.3*  HGB 12.4  HCT 37.8  PLT 292   BMET:  Basename 02/02/12 0510  NA 137  K 4.1  CL 92*  CO2 38*  GLUCOSE 116*  BUN 16  CREATININE 0.68  CALCIUM 9.2    PT/INR:  Basename 02/03/12 0500  LABPROT 26.1*  INR 2.35*   ABG    Component Value Date/Time   PHART 7.383 01/27/2012 0638   HCO3 21.7 01/27/2012 0638   TCO2 23 01/27/2012 1627   ACIDBASEDEF 3.0* 01/27/2012 0638   O2SAT 98.0 01/27/2012 0638   CBG (last 3)  No results found for this basename: GLUCAP:3 in the last 72 hours  Assessment/Plan: S/P Procedure(s) (LRB): MAZE (N/A) MINIMALLY INVASIVE MITRAL VALVE (MV) REPLACEMENT (Right) CHEST TUBE INSERTION (Left)  2. CV- NSR with 1 degree block, blood pressure well controlled, on Beta Blocker and Amiodarone 3. INR 2.35, on coumadin, will continue 2.5mg   daily 4. Dispo-patients chest xray looks good, no pneumothorax or pleural effusion appreciated.  Her INR is therapeutic.  She is medically stable and can be discharged to SNF today if bed available    LOS: 18 days    BARRETT, ERIN 02/03/2012   I have seen and examined the patient and agree with the assessment and plan as outlined.  Lysha Schrade H 02/03/2012 9:03 AM

## 2012-02-03 NOTE — Discharge Summary (Signed)
I agree with the above discharge summary and plan for follow-up.  Yameli Delamater H  

## 2012-02-03 NOTE — Progress Notes (Signed)
Patient discharge to SNF via (ambulance or private vehicle) 02/03/2012 3:08 PM. All necessary paperwork sent with patient to facility. [name] [name]

## 2012-02-03 NOTE — Progress Notes (Signed)
Patient central line removed per protocol without difficulty.  All ends intact. Patient tolerated procedure well. Instructed to leave pressure dressing on for 24 hours. [name] [name]

## 2012-02-05 DIAGNOSIS — Q233 Congenital mitral insufficiency: Secondary | ICD-10-CM | POA: Diagnosis not present

## 2012-02-05 DIAGNOSIS — J449 Chronic obstructive pulmonary disease, unspecified: Secondary | ICD-10-CM | POA: Diagnosis not present

## 2012-02-05 DIAGNOSIS — I2789 Other specified pulmonary heart diseases: Secondary | ICD-10-CM | POA: Diagnosis not present

## 2012-02-05 DIAGNOSIS — G35 Multiple sclerosis: Secondary | ICD-10-CM | POA: Diagnosis not present

## 2012-02-17 ENCOUNTER — Encounter: Payer: Self-pay | Admitting: Cardiology

## 2012-02-17 DIAGNOSIS — I480 Paroxysmal atrial fibrillation: Secondary | ICD-10-CM | POA: Insufficient documentation

## 2012-02-17 DIAGNOSIS — J449 Chronic obstructive pulmonary disease, unspecified: Secondary | ICD-10-CM | POA: Insufficient documentation

## 2012-02-17 DIAGNOSIS — E039 Hypothyroidism, unspecified: Secondary | ICD-10-CM | POA: Insufficient documentation

## 2012-02-17 DIAGNOSIS — I340 Nonrheumatic mitral (valve) insufficiency: Secondary | ICD-10-CM | POA: Insufficient documentation

## 2012-02-17 DIAGNOSIS — R0602 Shortness of breath: Secondary | ICD-10-CM | POA: Insufficient documentation

## 2012-02-17 DIAGNOSIS — I509 Heart failure, unspecified: Secondary | ICD-10-CM | POA: Insufficient documentation

## 2012-02-17 DIAGNOSIS — M1990 Unspecified osteoarthritis, unspecified site: Secondary | ICD-10-CM | POA: Insufficient documentation

## 2012-02-17 DIAGNOSIS — G709 Myoneural disorder, unspecified: Secondary | ICD-10-CM | POA: Insufficient documentation

## 2012-02-17 DIAGNOSIS — Z7901 Long term (current) use of anticoagulants: Secondary | ICD-10-CM | POA: Insufficient documentation

## 2012-02-17 DIAGNOSIS — R9430 Abnormal result of cardiovascular function study, unspecified: Secondary | ICD-10-CM | POA: Insufficient documentation

## 2012-02-17 DIAGNOSIS — C50919 Malignant neoplasm of unspecified site of unspecified female breast: Secondary | ICD-10-CM | POA: Insufficient documentation

## 2012-02-17 DIAGNOSIS — N319 Neuromuscular dysfunction of bladder, unspecified: Secondary | ICD-10-CM | POA: Insufficient documentation

## 2012-02-18 ENCOUNTER — Other Ambulatory Visit: Payer: Self-pay | Admitting: Thoracic Surgery (Cardiothoracic Vascular Surgery)

## 2012-02-18 DIAGNOSIS — Z48812 Encounter for surgical aftercare following surgery on the circulatory system: Secondary | ICD-10-CM | POA: Diagnosis not present

## 2012-02-18 DIAGNOSIS — I2789 Other specified pulmonary heart diseases: Secondary | ICD-10-CM | POA: Diagnosis not present

## 2012-02-18 DIAGNOSIS — I509 Heart failure, unspecified: Secondary | ICD-10-CM | POA: Diagnosis not present

## 2012-02-18 DIAGNOSIS — J438 Other emphysema: Secondary | ICD-10-CM | POA: Diagnosis not present

## 2012-02-18 DIAGNOSIS — N319 Neuromuscular dysfunction of bladder, unspecified: Secondary | ICD-10-CM | POA: Diagnosis not present

## 2012-02-18 DIAGNOSIS — I4891 Unspecified atrial fibrillation: Secondary | ICD-10-CM | POA: Diagnosis not present

## 2012-02-18 DIAGNOSIS — G35 Multiple sclerosis: Secondary | ICD-10-CM | POA: Diagnosis not present

## 2012-02-18 DIAGNOSIS — I059 Rheumatic mitral valve disease, unspecified: Secondary | ICD-10-CM

## 2012-02-19 ENCOUNTER — Ambulatory Visit (INDEPENDENT_AMBULATORY_CARE_PROVIDER_SITE_OTHER): Payer: Medicare Other | Admitting: *Deleted

## 2012-02-19 ENCOUNTER — Ambulatory Visit
Admission: RE | Admit: 2012-02-19 | Discharge: 2012-02-19 | Disposition: A | Discharge status: Home / Self Care | Payer: Medicare Other | Source: Ambulatory Visit | Attending: Thoracic Surgery (Cardiothoracic Vascular Surgery) | Admitting: Thoracic Surgery (Cardiothoracic Vascular Surgery)

## 2012-02-19 ENCOUNTER — Encounter: Payer: Self-pay | Admitting: Cardiology

## 2012-02-19 ENCOUNTER — Ambulatory Visit (INDEPENDENT_AMBULATORY_CARE_PROVIDER_SITE_OTHER): Payer: Medicare Other | Admitting: Cardiology

## 2012-02-19 VITALS — BP 123/78 | HR 84 | Ht 69.25 in | Wt 139.0 lb

## 2012-02-19 DIAGNOSIS — Z952 Presence of prosthetic heart valve: Secondary | ICD-10-CM

## 2012-02-19 DIAGNOSIS — R05 Cough: Secondary | ICD-10-CM | POA: Diagnosis not present

## 2012-02-19 DIAGNOSIS — Z954 Presence of other heart-valve replacement: Secondary | ICD-10-CM

## 2012-02-19 DIAGNOSIS — I059 Rheumatic mitral valve disease, unspecified: Secondary | ICD-10-CM

## 2012-02-19 DIAGNOSIS — Z8679 Personal history of other diseases of the circulatory system: Secondary | ICD-10-CM

## 2012-02-19 DIAGNOSIS — I509 Heart failure, unspecified: Secondary | ICD-10-CM

## 2012-02-19 DIAGNOSIS — Z7901 Long term (current) use of anticoagulants: Secondary | ICD-10-CM

## 2012-02-19 DIAGNOSIS — R918 Other nonspecific abnormal finding of lung field: Secondary | ICD-10-CM | POA: Diagnosis not present

## 2012-02-19 DIAGNOSIS — Z9889 Other specified postprocedural states: Secondary | ICD-10-CM | POA: Diagnosis not present

## 2012-02-19 DIAGNOSIS — J90 Pleural effusion, not elsewhere classified: Secondary | ICD-10-CM | POA: Diagnosis not present

## 2012-02-19 DIAGNOSIS — I4891 Unspecified atrial fibrillation: Secondary | ICD-10-CM

## 2012-02-19 LAB — POCT INR: INR: 3.1

## 2012-02-19 MED ORDER — AMIODARONE HCL 200 MG PO TABS: 200.0000 mg | ORAL_TABLET | Freq: Every day | ORAL | Status: DC

## 2012-02-19 NOTE — Patient Instructions (Signed)
Your physician recommends that you schedule a follow-up appointment in: 6 weeks  Your physician recommends that you return for lab work in: Monday, 02/22/12 (bmet)  Your physician has recommended you make the following change in your medication: Decrease your amiodarone to 200mg  once daily

## 2012-02-19 NOTE — Progress Notes (Addendum)
HPI  The patient is seen today post hospitalization for a complex illness. I have reviewed the extensive records carefully. The patient presented with congestive heart failure. Her echo showed good LV function but severe mitral regurgitation. She appeared to have a flail leaflet. She has multiple other medical problems that were stable. Evaluation revealed that she would need mitral valve surgery. It appeared that this could be done through a thoracotomy. Preparations were being made. The patient did have ongoing problems with CHF and required further diuretics. In addition she developed a rapid atrial fibrillation and at one point had to be rapidly cardioverted. She stabilized with all of this. She underwent mitral valve surgery. Careful attempts were made to repair her valve. Unfortunately this could not be done and she received a mechanical valve the mitral position. She has been coumadinized. She received a Maze procedure for the atrial fib. She is now here for followup.  Allergies  Allergen Reactions  . Erythromycin Nausea And Vomiting    Current Outpatient Prescriptions  Medication Sig Dispense Refill  . amiodarone (PACERONE) 200 MG tablet Take 1 tablet (200 mg total) by mouth 2 (two) times daily.  60 tablet  1  . feeding supplement (ENSURE COMPLETE) LIQD Take 237 mLs by mouth 3 (three) times daily with meals.      . furosemide (LASIX) 40 MG tablet Take 1 tablet (40 mg total) by mouth 2 (two) times daily.  60 tablet  0  . metoprolol tartrate (LOPRESSOR) 12.5 mg TABS Take 0.5 tablets (12.5 mg total) by mouth 2 (two) times daily.  30 tablet  1  . potassium chloride SA (K-DUR,KLOR-CON) 20 MEQ tablet Take 1 tablet (20 mEq total) by mouth 2 (two) times daily.  60 tablet  0  . PROGESTERONE MICRONIZED PO Take 25 mg by mouth 2 (two) times daily.      Marland Kitchen thyroid (ARMOUR) 60 MG tablet Take 60 mg by mouth daily.      Marland Kitchen warfarin (COUMADIN) 2.5 MG tablet Take 1 tablet (2.5 mg total) by mouth daily at 6  PM.  30 tablet  1    History   Social History  . Marital Status: Widowed    Spouse Name: N/A    Number of Children: N/A  . Years of Education: N/A   Occupational History  . Not on file.   Social History Main Topics  . Smoking status: Never Smoker   . Smokeless tobacco: Never Used  . Alcohol Use: No  . Drug Use: No  . Sexually Active:    Other Topics Concern  . Not on file   Social History Narrative  . No narrative on file    No family history on file.  Past Medical History  Diagnosis Date  . Multiple sclerosis   . Breast cancer   . Ovarian cyst   . Hypothyroidism   . Mitral valve regurgitation     Mitral valve replacement April, 2013, Mitral valve prolapse  . CHF (congestive heart failure)     Related to severe mitral regurgitation, April, 2013  . Shortness of breath     Related to severe mitral regurgitation, April, 2013  . Atrial fibrillation     Rapid atrial fibrillation in-hospital, Rapid cardioversion,  before mitral valve surgery  . Neuromuscular disorder     ms  . Arthritis     knees  . 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  S/P Maze operation for atrial fibrillation 01/26/2012    Complete biatrial lesion set using cryothermy via right mini thoracotomy  . Pulmonary hypertension     Echo, April, 2013, before mitral valve surgery  . 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 Assess by pulmonary team in the hospital April, 2013  . Anxiety   . Neurogenic bladder   . Ejection fraction     EF 60%, echo, April, 2013, with severe MR before mitral valve replacement  . Warfarin anticoagulation     Mechanical mitral prosthesis, April, [address]    Past Surgical History  Procedure Date  . Breast lumpectomy   . Lymphadenectomy   . Wrist surgery   . Hernia repair   . Tonsillectomy   . Cystoscopy   . Laparoscopy   . Tee without cardioversion  01/19/2012    Procedure: TRANSESOPHAGEAL ECHOCARDIOGRAM (TEE);  Surgeon: Peter M Swaziland, MD;  [facility] ENDOSCOPY;  Service: Cardiovascular;  Laterality: N/A;  . Maze 01/26/2012    Procedure: MAZE;  Surgeon: Purcell Nails, MD;  Location: [facility] OR;  Service: Open Heart Surgery;  Laterality: N/A;  . Mitral valve replacement 01/26/2012    Procedure: MINIMALLY INVASIVE MITRAL VALVE (MV) REPLACEMENT;  Surgeon: Purcell Nails, MD;  Location: [facility] OR;  Service: Open Heart Surgery;  Laterality: Right;  . Chest tube insertion 01/26/2012    Procedure: CHEST TUBE INSERTION;  Surgeon: Purcell Nails, MD;  Location: [facility] OR;  Service: Open Heart Surgery;  Laterality: Left;    ROS Patient denies fever, chills, headache, sweats, rash, change in vision, change in hearing, chest pain, cough, nausea vomiting, urinary symptoms. All other systems are reviewed and are negative.  PHYSICAL EXAM  Patient is oriented to person time and place. Affect is normal. She's recovering nicely. There is no jugulovenous distention. Lungs are clear. Respiratory effort is nonlabored. Cardiac exam reveals crisp closure sound of her mitral prosthesis. Her chest lesion is healing nicely. The abdomen is soft. There is no significant peripheral edema. There are no musculoskeletal deformities. There are no skin rashes.  Filed Vitals:   02/19/12 1231  BP: 123/78  Pulse: 84  Height: 5' 9.25" (1.759 m)  Weight: 139 lb (63.05 kg)   EKG is done today and reviewed by me. There is sinus rhythm. There are no diagnostic abnormalities.  ASSESSMENT & PLAN

## 2012-02-19 NOTE — Assessment & Plan Note (Signed)
Patient had significant rapid atrial fibrillation before her operation. She did not tolerate this well and actually was cardioverted. She is on amiodarone. I will decrease the dose to 200 mg daily. Hopefully this can be stopped over time.

## 2012-02-19 NOTE — Assessment & Plan Note (Signed)
It was felt that her CHF was predominantly related to her mitral regurgitation. However she seems to have an ongoing need for diuretics. She will be kept on Lasix for now. Chemistry lab is to be checked today.

## 2012-02-19 NOTE — Patient Instructions (Signed)

## 2012-02-19 NOTE — Assessment & Plan Note (Signed)
She has a mechanical prosthesis. She'll be continued on warfarin indefinitely

## 2012-02-19 NOTE — Assessment & Plan Note (Signed)
The patient is stable after her mitral valve replacement with a mechanical valve. She's doing very well.

## 2012-02-19 NOTE — Assessment & Plan Note (Signed)
The patient had a Maze procedure for her atrial fib. She is holding sinus. She is on amiodarone. The dose has been 200 mg twice a day. I will lower it to 200 mg daily today.

## 2012-02-22 ENCOUNTER — Other Ambulatory Visit: Payer: Medicare Other

## 2012-02-22 ENCOUNTER — Ambulatory Visit (INDEPENDENT_AMBULATORY_CARE_PROVIDER_SITE_OTHER): Payer: Self-pay | Admitting: Thoracic Surgery (Cardiothoracic Vascular Surgery)

## 2012-02-22 ENCOUNTER — Encounter: Payer: Self-pay | Admitting: Thoracic Surgery (Cardiothoracic Vascular Surgery)

## 2012-02-22 VITALS — BP 120/74 | HR 85 | Resp 16 | Ht 69.0 in | Wt 140.0 lb

## 2012-02-22 DIAGNOSIS — Z8679 Personal history of other diseases of the circulatory system: Secondary | ICD-10-CM

## 2012-02-22 DIAGNOSIS — I4891 Unspecified atrial fibrillation: Secondary | ICD-10-CM | POA: Diagnosis not present

## 2012-02-22 DIAGNOSIS — I2789 Other specified pulmonary heart diseases: Secondary | ICD-10-CM | POA: Diagnosis not present

## 2012-02-22 DIAGNOSIS — Z9889 Other specified postprocedural states: Secondary | ICD-10-CM

## 2012-02-22 DIAGNOSIS — Z952 Presence of prosthetic heart valve: Secondary | ICD-10-CM

## 2012-02-22 DIAGNOSIS — J438 Other emphysema: Secondary | ICD-10-CM | POA: Diagnosis not present

## 2012-02-22 DIAGNOSIS — Z954 Presence of other heart-valve replacement: Secondary | ICD-10-CM

## 2012-02-22 DIAGNOSIS — Z48812 Encounter for surgical aftercare following surgery on the circulatory system: Secondary | ICD-10-CM | POA: Diagnosis not present

## 2012-02-22 DIAGNOSIS — G35 Multiple sclerosis: Secondary | ICD-10-CM | POA: Diagnosis not present

## 2012-02-22 DIAGNOSIS — N319 Neuromuscular dysfunction of bladder, unspecified: Secondary | ICD-10-CM | POA: Diagnosis not present

## 2012-02-22 NOTE — Patient Instructions (Signed)
The patient has been advised to continue to gradually increase their physical activity as tolerated.  They should refrain from any heavy lifting or strenuous use of their arms and shoulders until at least 8 weeks from the time of their surgery, and they should avoid activities that cause increased pain in their chest on the side of their surgical incision.The patient has been instructed that they may return driving an automobile as long as they are no longer requiring oral narcotic pain relievers during the daytime.  They have been advised to start driving short distances during the daylight and gradually increase from there as they feel comfortable.  

## 2012-02-22 NOTE — Progress Notes (Signed)
[address]            [place] [address]          [phone]     CARDIOTHORACIC SURGERY OFFICE NOTE  Referring Provider is Luis Abed, MD PCP is Henri Medal, MD, MD   HPI:  Patient returns for routine followup nearly one month status post minimally invasive mitral valve replacement and Maze procedure. She has done well postoperatively. Since hospital discharge she has had her Coumadin dose adjusted and monitored. Her most recent INR was reportedly 3.1. She is been seen in followup by Dr. Myrtis Ser and returns for routine followup to our office today. She reports that she's doing quite well. She has minimal residual soreness in her chest and she has not taken any sort of pain relievers. She reports that her breathing is quite good. Her activity level is continued to gradually increase. She is eating fairly well. She has no complaints. She has not had any palpitations or dizzy spells.   Current Outpatient Prescriptions  Medication Sig Dispense Refill  . amiodarone (PACERONE) 200 MG tablet Take 1 tablet (200 mg total) by mouth daily.  30 tablet  5  . feeding supplement (ENSURE COMPLETE) LIQD Take 237 mLs by mouth 3 (three) times daily with meals.      . furosemide (LASIX) 40 MG tablet Take 1 tablet (40 mg total) by mouth 2 (two) times daily.  60 tablet  0  . metoprolol tartrate (LOPRESSOR) 12.5 mg TABS Take 0.5 tablets (12.5 mg total) by mouth 2 (two) times daily.  30 tablet  1  . potassium chloride SA (K-DUR,KLOR-CON) 20 MEQ tablet Take 1 tablet (20 mEq total) by mouth 2 (two) times daily.  60 tablet  0  . PROGESTERONE MICRONIZED PO Take 25 mg by mouth 2 (two) times daily.      Marland Kitchen thyroid (ARMOUR) 60 MG tablet Take 60 mg by mouth daily.      Marland Kitchen warfarin (COUMADIN) 2.5 MG tablet Take 1 tablet (2.5 mg total) by mouth daily at 6 PM.  30 tablet  1      Physical Exam:   BP 120/74  Pulse 85  Resp 16  Ht 5\' 9"  (1.753 m)  Wt 140 lb (63.504 kg)   BMI 20.67 kg/m2  SpO2 98%  General:  Well-appearing  Chest:   Clear to auscultation  CV:   Regular rate and rhythm with mechanical heart sounds  Incisions:  Clean and dry and healing nicely  Abdomen:  Soft and nontender  Extremities:  Warm and well-perfused with no lower extremity edema  Diagnostic Tests:  *RADIOLOGY REPORT*  Clinical Data: History of mitral valve replacement and MAZE  procedure on 01/26/2012. Cough and fatigue. History of right-sided  breast cancer.  CHEST - 2 VIEW 02/19/2012 Comparison: Chest x-ray 02/03/2012.  Findings: There are bilateral pleural effusions (small on the left  and moderate on the right). There continues to be irregular areas  of interstitial prominence and patchy opacities throughout the  right mid and lower lung, similar to the prior study from  02/03/2012. Right apical pleuroparenchymal thickening is  unchanged, and presumably reflects scarring. Pulmonary vasculature  is normal. Heart size is normal. A mechanical mitral valve is in  place. Mediastinal contours are unremarkable. Atherosclerotic  calcifications in the arch of the aorta. Postoperative changes of  right-sided lumpectomy and axillary nodal dissection are  again  noted.  IMPRESSION:  1. Slight interval enlargement of a moderate right-sided pleural  effusion. Small left-sided pleural effusion is unchanged. There  continues to be extensive interstitial and patchy airspace  opacities throughout the right mid and lower lung, slightly  increased compared to the prior study. This may in part reflect  some resolving postoperative scarring, however, this may  alternatively indicate a bronchopneumonia involving portions of the  right middle and right lower lobe. Clinical correlation is  recommended.  2. Status post minimally invasive MAZE procedure and mitral valve  replacement with mechanical mitral valve in place.  3. Atherosclerosis.  Original Report Authenticated By: Florencia Reasons, M.D.    Impression:  Patient is doing well following minimally invasive mitral valve replacement and Maze procedure. She is maintaining sinus rhythm. Her physical activity is progressing well. She has a very small residual pleural effusion on the right side.  Plan:  I've encouraged patient to continue to gradually increase her physical activity as tolerated without any particular limitations at this time. I think she can resume driving an automobile and I've encouraged her to start cardiac rehabilitation program. We'll plan to see her back in 2 months for routine followup and rhythm check.   Salvatore Decent. Cornelius Moras, MD 02/22/2012 5:17 PM

## 2012-02-23 DIAGNOSIS — Z48812 Encounter for surgical aftercare following surgery on the circulatory system: Secondary | ICD-10-CM | POA: Diagnosis not present

## 2012-02-23 DIAGNOSIS — I2789 Other specified pulmonary heart diseases: Secondary | ICD-10-CM | POA: Diagnosis not present

## 2012-02-23 DIAGNOSIS — N319 Neuromuscular dysfunction of bladder, unspecified: Secondary | ICD-10-CM | POA: Diagnosis not present

## 2012-02-23 DIAGNOSIS — I4891 Unspecified atrial fibrillation: Secondary | ICD-10-CM | POA: Diagnosis not present

## 2012-02-23 DIAGNOSIS — G35 Multiple sclerosis: Secondary | ICD-10-CM | POA: Diagnosis not present

## 2012-02-23 DIAGNOSIS — J438 Other emphysema: Secondary | ICD-10-CM | POA: Diagnosis not present

## 2012-02-24 DIAGNOSIS — I4891 Unspecified atrial fibrillation: Secondary | ICD-10-CM | POA: Diagnosis not present

## 2012-02-24 DIAGNOSIS — G35 Multiple sclerosis: Secondary | ICD-10-CM | POA: Diagnosis not present

## 2012-02-24 DIAGNOSIS — N319 Neuromuscular dysfunction of bladder, unspecified: Secondary | ICD-10-CM | POA: Diagnosis not present

## 2012-02-24 DIAGNOSIS — Z48812 Encounter for surgical aftercare following surgery on the circulatory system: Secondary | ICD-10-CM | POA: Diagnosis not present

## 2012-02-24 DIAGNOSIS — I2789 Other specified pulmonary heart diseases: Secondary | ICD-10-CM | POA: Diagnosis not present

## 2012-02-24 DIAGNOSIS — J438 Other emphysema: Secondary | ICD-10-CM | POA: Diagnosis not present

## 2012-02-25 DIAGNOSIS — I4891 Unspecified atrial fibrillation: Secondary | ICD-10-CM | POA: Diagnosis not present

## 2012-02-25 DIAGNOSIS — G35 Multiple sclerosis: Secondary | ICD-10-CM | POA: Diagnosis not present

## 2012-02-25 DIAGNOSIS — I2789 Other specified pulmonary heart diseases: Secondary | ICD-10-CM | POA: Diagnosis not present

## 2012-02-25 DIAGNOSIS — N319 Neuromuscular dysfunction of bladder, unspecified: Secondary | ICD-10-CM | POA: Diagnosis not present

## 2012-02-25 DIAGNOSIS — Z48812 Encounter for surgical aftercare following surgery on the circulatory system: Secondary | ICD-10-CM | POA: Diagnosis not present

## 2012-02-25 DIAGNOSIS — J438 Other emphysema: Secondary | ICD-10-CM | POA: Diagnosis not present

## 2012-02-26 ENCOUNTER — Ambulatory Visit (INDEPENDENT_AMBULATORY_CARE_PROVIDER_SITE_OTHER): Payer: Medicare Other | Admitting: Pharmacist

## 2012-02-26 DIAGNOSIS — Z954 Presence of other heart-valve replacement: Secondary | ICD-10-CM | POA: Diagnosis not present

## 2012-02-26 DIAGNOSIS — Z9889 Other specified postprocedural states: Secondary | ICD-10-CM

## 2012-02-26 DIAGNOSIS — Z952 Presence of prosthetic heart valve: Secondary | ICD-10-CM

## 2012-02-26 DIAGNOSIS — I509 Heart failure, unspecified: Secondary | ICD-10-CM | POA: Diagnosis not present

## 2012-02-26 DIAGNOSIS — Z7901 Long term (current) use of anticoagulants: Secondary | ICD-10-CM | POA: Diagnosis not present

## 2012-02-26 DIAGNOSIS — I4891 Unspecified atrial fibrillation: Secondary | ICD-10-CM | POA: Diagnosis not present

## 2012-03-01 DIAGNOSIS — I4891 Unspecified atrial fibrillation: Secondary | ICD-10-CM | POA: Diagnosis not present

## 2012-03-01 DIAGNOSIS — J438 Other emphysema: Secondary | ICD-10-CM | POA: Diagnosis not present

## 2012-03-01 DIAGNOSIS — Z48812 Encounter for surgical aftercare following surgery on the circulatory system: Secondary | ICD-10-CM | POA: Diagnosis not present

## 2012-03-01 DIAGNOSIS — I2789 Other specified pulmonary heart diseases: Secondary | ICD-10-CM | POA: Diagnosis not present

## 2012-03-01 DIAGNOSIS — G35 Multiple sclerosis: Secondary | ICD-10-CM | POA: Diagnosis not present

## 2012-03-01 DIAGNOSIS — N319 Neuromuscular dysfunction of bladder, unspecified: Secondary | ICD-10-CM | POA: Diagnosis not present

## 2012-03-03 ENCOUNTER — Encounter: Payer: Self-pay | Admitting: Critical Care Medicine

## 2012-03-03 ENCOUNTER — Encounter (HOSPITAL_COMMUNITY)
Admission: RE | Admit: 2012-03-03 | Discharge: 2012-03-03 | Disposition: A | Discharge status: Home / Self Care | Payer: Medicare Other | Source: Ambulatory Visit | Attending: Cardiology | Admitting: Cardiology

## 2012-03-03 ENCOUNTER — Ambulatory Visit (INDEPENDENT_AMBULATORY_CARE_PROVIDER_SITE_OTHER): Payer: Medicare Other | Admitting: Critical Care Medicine

## 2012-03-03 VITALS — BP 108/62 | HR 92 | Temp 98.1°F | Ht 69.0 in | Wt 137.0 lb

## 2012-03-03 DIAGNOSIS — E039 Hypothyroidism, unspecified: Secondary | ICD-10-CM | POA: Insufficient documentation

## 2012-03-03 DIAGNOSIS — I509 Heart failure, unspecified: Secondary | ICD-10-CM | POA: Insufficient documentation

## 2012-03-03 DIAGNOSIS — M949 Disorder of cartilage, unspecified: Secondary | ICD-10-CM | POA: Insufficient documentation

## 2012-03-03 DIAGNOSIS — Z7901 Long term (current) use of anticoagulants: Secondary | ICD-10-CM | POA: Insufficient documentation

## 2012-03-03 DIAGNOSIS — Z853 Personal history of malignant neoplasm of breast: Secondary | ICD-10-CM | POA: Insufficient documentation

## 2012-03-03 DIAGNOSIS — J449 Chronic obstructive pulmonary disease, unspecified: Secondary | ICD-10-CM

## 2012-03-03 DIAGNOSIS — R000 Tachycardia, unspecified: Secondary | ICD-10-CM | POA: Insufficient documentation

## 2012-03-03 DIAGNOSIS — R0602 Shortness of breath: Secondary | ICD-10-CM | POA: Diagnosis not present

## 2012-03-03 DIAGNOSIS — G35 Multiple sclerosis: Secondary | ICD-10-CM | POA: Insufficient documentation

## 2012-03-03 DIAGNOSIS — M899 Disorder of bone, unspecified: Secondary | ICD-10-CM | POA: Insufficient documentation

## 2012-03-03 DIAGNOSIS — I272 Pulmonary hypertension, unspecified: Secondary | ICD-10-CM

## 2012-03-03 DIAGNOSIS — Z5189 Encounter for other specified aftercare: Secondary | ICD-10-CM | POA: Insufficient documentation

## 2012-03-03 DIAGNOSIS — Z881 Allergy status to other antibiotic agents status: Secondary | ICD-10-CM | POA: Insufficient documentation

## 2012-03-03 DIAGNOSIS — J438 Other emphysema: Secondary | ICD-10-CM | POA: Insufficient documentation

## 2012-03-03 DIAGNOSIS — I2789 Other specified pulmonary heart diseases: Secondary | ICD-10-CM | POA: Insufficient documentation

## 2012-03-03 DIAGNOSIS — Z79899 Other long term (current) drug therapy: Secondary | ICD-10-CM | POA: Insufficient documentation

## 2012-03-03 DIAGNOSIS — I059 Rheumatic mitral valve disease, unspecified: Secondary | ICD-10-CM | POA: Insufficient documentation

## 2012-03-03 DIAGNOSIS — Z9889 Other specified postprocedural states: Secondary | ICD-10-CM | POA: Insufficient documentation

## 2012-03-03 DIAGNOSIS — F411 Generalized anxiety disorder: Secondary | ICD-10-CM | POA: Insufficient documentation

## 2012-03-03 DIAGNOSIS — I440 Atrioventricular block, first degree: Secondary | ICD-10-CM | POA: Insufficient documentation

## 2012-03-03 DIAGNOSIS — I4891 Unspecified atrial fibrillation: Secondary | ICD-10-CM | POA: Insufficient documentation

## 2012-03-03 NOTE — Patient Instructions (Signed)
Ok to stay off spiriva Return to pulmonary if needed

## 2012-03-03 NOTE — Progress Notes (Signed)
Cardiac Rehab Medication Review by a Pharmacist  Does the patient  feel that his/her medications are working for him/her?  Yes    Has the patient been experiencing any side effects to the medications prescribed?  no  Does the patient measure his/her own blood pressure or blood glucose at home?  no   Does the patient have any problems obtaining medications due to transportation or finances?   no  Understanding of regimen: excellent Understanding of indications: good Potential of compliance: excellent    Pharmacist comments: Pt is concerned that she is unable to gain weight even with drinking extra Ensure.  She also has inquiries regarding her lasix frequency for her next visit.      [name] [name] 03/03/2012 8:54 AM

## 2012-03-03 NOTE — Assessment & Plan Note (Signed)
1 hypertension was secondary in nature due to mitral valve disease and now is improving status post mitral valve repair

## 2012-03-03 NOTE — Progress Notes (Signed)
Subjective:    Patient ID: [name] [name], female    DOB: 07-16-1951, 61 y.o.   MRN: [id number]  HPI  Post hosp f/u 61 y.o. F Copd. Had MVrepair and MAZE, minimally invasive.  4/13 Hx of pulm HTN , copd changes on xray and pfts Pt was d/c off inhalers . spiriva was d/c.  Pt adm 3/30 - d/c 4/17 Since d/c doing well.  [facility] was out to home.  All signed off. Now has some sl cough.  If walks a distance , is better.  No real wheeze.  Will do IS.  Dyspnea is markedly better.  Past Medical History  Diagnosis Date  . Multiple sclerosis   . Breast cancer   . Ovarian cyst   . Hypothyroidism   . Mitral valve regurgitation     Mitral valve replacement April, 2013, Mitral valve prolapse  . CHF (congestive heart failure)     Related to severe mitral regurgitation, April, 2013  . Shortness of breath     Related to severe mitral regurgitation, April, 2013  . Atrial fibrillation     Rapid atrial fibrillation in-hospital, Rapid cardioversion,  before mitral valve surgery  . Neuromuscular disorder     ms  . Arthritis     knees  . 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 S/P Maze operation for atrial fibrillation 01/26/2012    Complete biatrial lesion set using cryothermy via right mini thoracotomy  . Pulmonary hypertension     Echo, April, 2013, before mitral valve surgery  . 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 Assess by pulmonary team in the hospital April, 2013  . Anxiety   . Neurogenic bladder   . Ejection fraction     EF 60%, echo, April, 2013, with severe MR before mitral valve replacement  . Warfarin anticoagulation     Mechanical mitral prosthesis, April, 40981     No family history on file.   History   Social History  . Marital Status: Widowed    Spouse Name: N/A    Number of Children: N/A  . Years of Education: N/A   Occupational History  . Not on file.    Social History Main Topics  . Smoking status: Never Smoker   . Smokeless tobacco: Never Used  . Alcohol Use: No  . Drug Use: No  . Sexually Active: Not on file   Other Topics Concern  . Not on file   Social History Narrative  . No narrative on file     Allergies  Allergen Reactions  . Erythromycin Nausea And Vomiting     Outpatient Prescriptions Prior to Visit  Medication Sig Dispense Refill  . amiodarone (PACERONE) 200 MG tablet Take 1 tablet (200 mg total) by mouth daily.  30 tablet  5  . furosemide (LASIX) 40 MG tablet Take 1 tablet (40 mg total) by mouth 2 (two) times daily.  60 tablet  0  . potassium chloride SA (K-DUR,KLOR-CON) 20 MEQ tablet Take 1 tablet (20 mEq total) by mouth 2 (two) times daily.  60 tablet  0  . PROGESTERONE MICRONIZED PO Take 25 mg by mouth 2 (two) times daily.      Marland Kitchen thyroid (ARMOUR) 60 MG tablet Take 60 mg by mouth daily.      Marland Kitchen warfarin (COUMADIN) 2.5 MG tablet Take 1 tablet (2.5 mg total) by mouth daily at 6 PM.  30 tablet  1  . metoprolol tartrate (LOPRESSOR) 12.5 mg TABS Take 0.5 tablets (12.5 mg total) by mouth 2 (two) times daily.  30 tablet  1     Review of Systems Constitutional:   No  weight loss, night sweats,  Fevers, chills, fatigue, lassitude. HEENT:   No headaches,  Difficulty swallowing,  Tooth/dental problems,  Sore throat,                No sneezing, itching, ear ache, nasal congestion, post nasal drip,   CV:  No chest pain,  Orthopnea, PND, swelling in lower extremities, anasarca, dizziness, palpitations  GI  No heartburn, indigestion, abdominal pain, nausea, vomiting, diarrhea, change in bowel habits, loss of appetite  Resp: No shortness of breath with exertion or at rest.  No excess mucus, no productive cough,  No non-productive cough,  No coughing up of blood.  No change in color of mucus.  No wheezing.  No chest wall deformity  Skin: no rash or lesions.  GU: no dysuria, change in color of urine, no urgency or  frequency.  No flank pain.  MS:  No joint pain or swelling.  No decreased range of motion.  No back pain.  Psych:  No change in mood or affect. No depression or anxiety.  No memory loss.     Objective:   Physical Exam Filed Vitals:   03/03/12 1439  BP: 108/62  Pulse: 92  Temp: 98.1 F (36.7 C)  TempSrc: Oral  Height: 5\' 9"  (1.753 m)  Weight: 137 lb (62.143 kg)  SpO2: 96%    Gen: Thin white female, in no distress,  normal affect  ENT: No lesions,  mouth clear,  oropharynx clear, no postnasal drip  Neck: No JVD, no TMG, no carotid bruits  Lungs: No use of accessory muscles, no dullness to percussion, clear without rales or rhonchi  Cardiovascular: RRR, heart sounds normal, no murmur or gallops, no peripheral edema  Abdomen: soft and NT, no HSM,  BS normal  Musculoskeletal: No deformities, no cyanosis or clubbing  Neuro: alert, non focal  Skin: Warm, no lesions or rashes  No results found.        Assessment & Plan:   COPD (chronic obstructive pulmonary disease) Chronic obstructive lung disease with mild airflow limitation and primary emphysema is demonstrated on chest x-ray This patient does not require bronchodilator therapy at this time as most of her dyspnea has resolved following mitral valve repair Plan Discontinue further inhaler medications Return to pulmonary as needed if symptoms worsen  Pulmonary hypertension 1 hypertension was secondary in nature due to mitral valve disease and now is improving status post mitral valve repair    Updated Medication List Outpatient Encounter Prescriptions as of 03/03/2012  Medication Sig Dispense Refill  . amiodarone (PACERONE) 200 MG tablet Take 1 tablet (200 mg total) by mouth daily.  30 tablet  5  . Black Cohosh 40 MG CAPS Take 1 capsule by mouth daily.      . Bromelains (BROMELAIN PO) Take 1 capsule by mouth 2 (two) times daily.      . Cholecalciferol (VITAMIN D) 2000 UNITS CAPS Take 6,000 Units by mouth  daily.      . Coenzyme Q10 (CO Q-10) 100 MG CAPS Take 1 capsule by mouth daily.      . Cranberry 500 MG CAPS Take 1 capsule by mouth daily.      . feeding supplement (ENSURE COMPLETE) LIQD Take 237 mLs by mouth 2 (two)  times daily between meals.      . furosemide (LASIX) 40 MG tablet Take 1 tablet (40 mg total) by mouth 2 (two) times daily.  60 tablet  0  . L-THEANINE PO Take 1 capsule by mouth 2 (two) times daily.      . Magnesium 200 MG TABS Take 400 mg by mouth 2 (two) times daily.      . metoprolol tartrate (LOPRESSOR) 12.5 mg TABS Take 6.25 mg by mouth 2 (two) times daily.      Marland Kitchen MILK THISTLE PO Take 1 capsule by mouth 2 (two) times daily.      . Omega-3 Fatty Acids (FISH OIL PO) Take 2 capsules by mouth 2 (two) times daily.      Marland Kitchen OVER THE COUNTER MEDICATION Red marine algae - 2 capsules daily      . potassium chloride SA (K-DUR,KLOR-CON) 20 MEQ tablet Take 1 tablet (20 mEq total) by mouth 2 (two) times daily.  60 tablet  0  . PROGESTERONE MICRONIZED PO Take 25 mg by mouth 2 (two) times daily.      Marland Kitchen thyroid (ARMOUR) 60 MG tablet Take 60 mg by mouth daily.      . vitamin A 86578 UNIT capsule Take 10,000 Units by mouth daily.      . vitamin E 400 UNIT capsule Take 400 Units by mouth daily.      Marland Kitchen warfarin (COUMADIN) 2.5 MG tablet Take 1 tablet (2.5 mg total) by mouth daily at 6 PM.  30 tablet  1  . DISCONTD: metoprolol tartrate (LOPRESSOR) 12.5 mg TABS Take 0.5 tablets (12.5 mg total) by mouth 2 (two) times daily.  30 tablet  1

## 2012-03-03 NOTE — Assessment & Plan Note (Signed)
Chronic obstructive lung disease with mild airflow limitation and primary emphysema is demonstrated on chest x-ray This patient does not require bronchodilator therapy at this time as most of her dyspnea has resolved following mitral valve repair Plan Discontinue further inhaler medications Return to pulmonary as needed if symptoms worsen

## 2012-03-04 ENCOUNTER — Ambulatory Visit (INDEPENDENT_AMBULATORY_CARE_PROVIDER_SITE_OTHER): Payer: Medicare Other | Admitting: Pharmacist

## 2012-03-04 DIAGNOSIS — I4891 Unspecified atrial fibrillation: Secondary | ICD-10-CM

## 2012-03-04 DIAGNOSIS — Z7901 Long term (current) use of anticoagulants: Secondary | ICD-10-CM | POA: Diagnosis not present

## 2012-03-04 DIAGNOSIS — I509 Heart failure, unspecified: Secondary | ICD-10-CM | POA: Diagnosis not present

## 2012-03-04 DIAGNOSIS — Z954 Presence of other heart-valve replacement: Secondary | ICD-10-CM

## 2012-03-04 DIAGNOSIS — Z8679 Personal history of other diseases of the circulatory system: Secondary | ICD-10-CM

## 2012-03-04 DIAGNOSIS — Z9889 Other specified postprocedural states: Secondary | ICD-10-CM

## 2012-03-04 DIAGNOSIS — Z952 Presence of prosthetic heart valve: Secondary | ICD-10-CM

## 2012-03-07 ENCOUNTER — Telehealth: Payer: Self-pay | Admitting: Cardiology

## 2012-03-07 ENCOUNTER — Encounter (HOSPITAL_COMMUNITY)
Admission: RE | Admit: 2012-03-07 | Discharge: 2012-03-07 | Disposition: A | Discharge status: Home / Self Care | Payer: Medicare Other | Source: Ambulatory Visit | Attending: Cardiology | Admitting: Cardiology

## 2012-03-07 DIAGNOSIS — I440 Atrioventricular block, first degree: Secondary | ICD-10-CM | POA: Diagnosis not present

## 2012-03-07 DIAGNOSIS — R000 Tachycardia, unspecified: Secondary | ICD-10-CM | POA: Diagnosis not present

## 2012-03-07 DIAGNOSIS — Z9889 Other specified postprocedural states: Secondary | ICD-10-CM | POA: Diagnosis not present

## 2012-03-07 DIAGNOSIS — M899 Disorder of bone, unspecified: Secondary | ICD-10-CM | POA: Diagnosis not present

## 2012-03-07 DIAGNOSIS — I4891 Unspecified atrial fibrillation: Secondary | ICD-10-CM

## 2012-03-07 DIAGNOSIS — E039 Hypothyroidism, unspecified: Secondary | ICD-10-CM | POA: Diagnosis not present

## 2012-03-07 DIAGNOSIS — Z853 Personal history of malignant neoplasm of breast: Secondary | ICD-10-CM | POA: Diagnosis not present

## 2012-03-07 DIAGNOSIS — Z881 Allergy status to other antibiotic agents status: Secondary | ICD-10-CM | POA: Diagnosis not present

## 2012-03-07 DIAGNOSIS — G35 Multiple sclerosis: Secondary | ICD-10-CM | POA: Diagnosis not present

## 2012-03-07 DIAGNOSIS — I509 Heart failure, unspecified: Secondary | ICD-10-CM | POA: Diagnosis not present

## 2012-03-07 DIAGNOSIS — J438 Other emphysema: Secondary | ICD-10-CM | POA: Diagnosis not present

## 2012-03-07 DIAGNOSIS — I059 Rheumatic mitral valve disease, unspecified: Secondary | ICD-10-CM | POA: Diagnosis not present

## 2012-03-07 DIAGNOSIS — I2789 Other specified pulmonary heart diseases: Secondary | ICD-10-CM | POA: Diagnosis not present

## 2012-03-07 DIAGNOSIS — F411 Generalized anxiety disorder: Secondary | ICD-10-CM | POA: Diagnosis not present

## 2012-03-07 DIAGNOSIS — Z7901 Long term (current) use of anticoagulants: Secondary | ICD-10-CM | POA: Diagnosis not present

## 2012-03-07 DIAGNOSIS — Z79899 Other long term (current) drug therapy: Secondary | ICD-10-CM | POA: Diagnosis not present

## 2012-03-07 DIAGNOSIS — Z5189 Encounter for other specified aftercare: Secondary | ICD-10-CM | POA: Diagnosis not present

## 2012-03-07 NOTE — Progress Notes (Signed)
Pt started cardiac rehab today.  Pt tolerated light exercise without difficulty. [name] used a cane for stability due to hx of MS. Vital signs stable. Telemetry rhythm sinus without ectopy. Will continue to monitor the patient throughout  the program.

## 2012-03-07 NOTE — Telephone Encounter (Signed)
Pt on 40 mg of lasix's twice a day, no longer having leg swelling , can she either decrease or stop altogether? pls call [phone]

## 2012-03-07 NOTE — Telephone Encounter (Signed)
I spoke with the patient. She states her right leg is back to normal and her left leg has only intermittent swelling in the ankle. I explained we would need to get Dr. Henrietta Hoover ok to change her dose. I explained I will forward this message to him. He will be back in the office on Wednesday and hopefully we can call her back then. She is agreeable.

## 2012-03-08 NOTE — Telephone Encounter (Signed)
It is okay to decrease the Lasix to 40 mg once daily

## 2012-03-08 NOTE — Telephone Encounter (Signed)
The patient is aware of Dr. Henrietta Hoover recommendations to decrease lasix to 40 mg once daily.

## 2012-03-09 ENCOUNTER — Encounter (HOSPITAL_COMMUNITY)
Admission: RE | Admit: 2012-03-09 | Discharge: 2012-03-09 | Disposition: A | Discharge status: Home / Self Care | Payer: Medicare Other | Source: Ambulatory Visit | Attending: Cardiology | Admitting: Cardiology

## 2012-03-09 NOTE — Progress Notes (Signed)
[name] [name] 61 y.o. female       Nutrition Screen                                                                    YES  NO Do you live in a nursing home?  X   Do you eat out more than 3 times/week?    X If yes, how many times per week do you eat out?     Do you have food allergies?  X  If yes, what are you allergic to? Gluten sensitivity, lactose intolerant  Have you gained or lost more than 10 lbs without trying?              X  If yes, how much weight have you lost and over what time period?    Do you want to lose weight?     X If yes, what is a goal weight or amount of weight you would like to lose?    Do you eat alone most of the time?  X    Do you eat less than 2 meals/day?  X If yes, how many meals do you eat?    Do you drink more than 3 alcohol drinks/day?  X If yes, how many drinks per day?   Are you having trouble with constipation? *  X If yes, what are you doing to help relieve constipation?  Do you have financial difficulties with buying food?*    X   Are you experiencing regular nausea/ vomiting?*     X   Do you have a poor appetite? *                                        X   Do you have trouble chewing/swallowing? *   X    Pt with diagnoses of:  X COPD X AVR/MVR/ICD      X %  Body fat >goal / Body Mass Index >25 X HTN / BP >120/80 XCHF        Pt Risk Score   3       Diagnosis Risk Score  70       Total Risk Score   73                        X High Risk                Low Risk    HT: 70" Ht Readings from Last 1 Encounters:  03/03/12 5\' 9"  (1.753 m)    WT:   138.8 lb (63.1 kg) Wt Readings from Last 3 Encounters:  03/03/12 137 lb (62.143 kg)  03/03/12 139 lb 1.8 oz (63.1 kg)  02/22/12 140 lb (63.504 kg)     IBW 68.2 93%IBW BMI 20.0 30.8%body fat  Meds reviewed: Black Cohosh, Bromelain, Vitamin D, CoQ10, Cranberry, Ensure BID, Lasix, L-Theanine, Magnesium, Milk thistle, Fish oil, vitamin A, vitamin E, Warfarin Past Medical History  Diagnosis Date  .  Multiple sclerosis   . Breast cancer   . Ovarian cyst   . Hypothyroidism   .  Mitral valve regurgitation     Mitral valve replacement April, 2013, Mitral valve prolapse  . CHF (congestive heart failure)     Related to severe mitral regurgitation, April, 2013  . Shortness of breath     Related to severe mitral regurgitation, April, 2013  . Atrial fibrillation     Rapid atrial fibrillation in-hospital, Rapid cardioversion,  before mitral valve surgery  . Neuromuscular disorder     ms  . Arthritis     knees  . 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 S/P Maze operation for atrial fibrillation 01/26/2012    Complete biatrial lesion set using cryothermy via right mini thoracotomy  . Pulmonary hypertension     Echo, April, 2013, before mitral valve surgery  . 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 Assess by pulmonary team in the hospital April, 2013  . Anxiety   . Neurogenic bladder   . Ejection fraction     EF 60%, echo, April, 2013, with severe MR before mitral valve replacement  . Warfarin anticoagulation     Mechanical mitral prosthesis, April, [address]109       Activity level: Pt is moderately active  Wt goal: to be determined with pt Current tobacco use? No Food/Drug Interaction? Yes      If Y, which med(s)? Coumadin If yes, pt given Food/Drug Interaction handout? Yes Labs:  Lipid Panel  No results found for this basename: chol, trig, hdl, cholhdl, vldl, ldlcalc   Lab Results  Component Value Date   HGBA1C 5.2 01/26/2012   LDL goal: < 100      MI, DM, Carotid or PVD and > 2:      HTN, family h/o, >63 yo female Estimated Daily Nutrition Needs for: ? wt gain 2400-2900 Kcal , Total Fat 65-80gm, Saturated Fat 18-22 gm, Trans Fat 2.4-2.9 gm,  Sodium less than 1500 mg

## 2012-03-11 ENCOUNTER — Telehealth: Payer: Self-pay | Admitting: Cardiology

## 2012-03-11 ENCOUNTER — Encounter (HOSPITAL_COMMUNITY)
Admission: RE | Admit: 2012-03-11 | Discharge: 2012-03-11 | Disposition: A | Discharge status: Home / Self Care | Payer: Medicare Other | Source: Ambulatory Visit | Attending: Cardiology | Admitting: Cardiology

## 2012-03-11 ENCOUNTER — Ambulatory Visit (INDEPENDENT_AMBULATORY_CARE_PROVIDER_SITE_OTHER): Payer: Medicare Other | Admitting: *Deleted

## 2012-03-11 DIAGNOSIS — Z9889 Other specified postprocedural states: Secondary | ICD-10-CM

## 2012-03-11 DIAGNOSIS — Z7901 Long term (current) use of anticoagulants: Secondary | ICD-10-CM | POA: Diagnosis not present

## 2012-03-11 DIAGNOSIS — Z952 Presence of prosthetic heart valve: Secondary | ICD-10-CM

## 2012-03-11 DIAGNOSIS — I4891 Unspecified atrial fibrillation: Secondary | ICD-10-CM | POA: Diagnosis not present

## 2012-03-11 DIAGNOSIS — I509 Heart failure, unspecified: Secondary | ICD-10-CM | POA: Diagnosis not present

## 2012-03-11 DIAGNOSIS — Z954 Presence of other heart-valve replacement: Secondary | ICD-10-CM

## 2012-03-11 LAB — POCT INR: INR: 2

## 2012-03-11 NOTE — Telephone Encounter (Signed)
May go to just one daily with the Lasix. Will need BMET at next visit.

## 2012-03-11 NOTE — Telephone Encounter (Signed)
Advised patient and scheduled labs 

## 2012-03-11 NOTE — Telephone Encounter (Signed)
New problem:  Patient calling wants discuss cutting back the K+ to once a day. Due to lasix was cut back to once a day.

## 2012-03-11 NOTE — Telephone Encounter (Signed)
Patient was taking Lasix and Potassium twice a day while she was in the hospital a month ago and has continued.  On 5/21 per phone note Lasix decreased to one daily, should patient continue potassium twice a day or decrease.  Patient states she does not want to have labs again at this time secondary to still being bruised from hospital.  Will forward to [facility] NP for review

## 2012-03-16 ENCOUNTER — Encounter (HOSPITAL_COMMUNITY)
Admission: RE | Admit: 2012-03-16 | Discharge: 2012-03-16 | Disposition: A | Discharge status: Home / Self Care | Payer: Medicare Other | Source: Ambulatory Visit | Attending: Cardiology | Admitting: Cardiology

## 2012-03-16 ENCOUNTER — Other Ambulatory Visit: Payer: Self-pay | Admitting: Cardiology

## 2012-03-18 ENCOUNTER — Ambulatory Visit (INDEPENDENT_AMBULATORY_CARE_PROVIDER_SITE_OTHER): Payer: Medicare Other | Admitting: *Deleted

## 2012-03-18 ENCOUNTER — Encounter (HOSPITAL_COMMUNITY)
Admission: RE | Admit: 2012-03-18 | Discharge: 2012-03-18 | Disposition: A | Discharge status: Home / Self Care | Payer: Medicare Other | Source: Ambulatory Visit | Attending: Cardiology | Admitting: Cardiology

## 2012-03-18 DIAGNOSIS — Z7901 Long term (current) use of anticoagulants: Secondary | ICD-10-CM

## 2012-03-18 DIAGNOSIS — Z952 Presence of prosthetic heart valve: Secondary | ICD-10-CM

## 2012-03-18 DIAGNOSIS — Z9889 Other specified postprocedural states: Secondary | ICD-10-CM

## 2012-03-18 DIAGNOSIS — Z954 Presence of other heart-valve replacement: Secondary | ICD-10-CM

## 2012-03-18 DIAGNOSIS — I509 Heart failure, unspecified: Secondary | ICD-10-CM

## 2012-03-18 DIAGNOSIS — Z8679 Personal history of other diseases of the circulatory system: Secondary | ICD-10-CM

## 2012-03-18 DIAGNOSIS — I4891 Unspecified atrial fibrillation: Secondary | ICD-10-CM

## 2012-03-18 LAB — POCT INR: INR: 3.1

## 2012-03-18 NOTE — Progress Notes (Signed)
[name] [name] feels she has been working to hard with her previous exercise sessions. [name] [name] will work at reduced workloads today.

## 2012-03-21 ENCOUNTER — Encounter (HOSPITAL_COMMUNITY)
Admission: RE | Admit: 2012-03-21 | Discharge: 2012-03-21 | Disposition: A | Discharge status: Home / Self Care | Payer: Medicare Other | Source: Ambulatory Visit | Attending: Cardiology | Admitting: Cardiology

## 2012-03-21 DIAGNOSIS — R000 Tachycardia, unspecified: Secondary | ICD-10-CM | POA: Insufficient documentation

## 2012-03-21 DIAGNOSIS — I4891 Unspecified atrial fibrillation: Secondary | ICD-10-CM | POA: Insufficient documentation

## 2012-03-21 DIAGNOSIS — Z5189 Encounter for other specified aftercare: Secondary | ICD-10-CM | POA: Insufficient documentation

## 2012-03-21 DIAGNOSIS — Z9889 Other specified postprocedural states: Secondary | ICD-10-CM | POA: Insufficient documentation

## 2012-03-21 DIAGNOSIS — Z79899 Other long term (current) drug therapy: Secondary | ICD-10-CM | POA: Diagnosis not present

## 2012-03-21 DIAGNOSIS — I059 Rheumatic mitral valve disease, unspecified: Secondary | ICD-10-CM | POA: Diagnosis not present

## 2012-03-21 DIAGNOSIS — G35 Multiple sclerosis: Secondary | ICD-10-CM | POA: Insufficient documentation

## 2012-03-21 DIAGNOSIS — Z853 Personal history of malignant neoplasm of breast: Secondary | ICD-10-CM | POA: Diagnosis not present

## 2012-03-21 DIAGNOSIS — F411 Generalized anxiety disorder: Secondary | ICD-10-CM | POA: Insufficient documentation

## 2012-03-21 DIAGNOSIS — M899 Disorder of bone, unspecified: Secondary | ICD-10-CM | POA: Diagnosis not present

## 2012-03-21 DIAGNOSIS — I509 Heart failure, unspecified: Secondary | ICD-10-CM | POA: Diagnosis not present

## 2012-03-21 DIAGNOSIS — Z881 Allergy status to other antibiotic agents status: Secondary | ICD-10-CM | POA: Diagnosis not present

## 2012-03-21 DIAGNOSIS — J438 Other emphysema: Secondary | ICD-10-CM | POA: Diagnosis not present

## 2012-03-21 DIAGNOSIS — I440 Atrioventricular block, first degree: Secondary | ICD-10-CM | POA: Insufficient documentation

## 2012-03-21 DIAGNOSIS — I2789 Other specified pulmonary heart diseases: Secondary | ICD-10-CM | POA: Insufficient documentation

## 2012-03-21 DIAGNOSIS — Z7901 Long term (current) use of anticoagulants: Secondary | ICD-10-CM | POA: Insufficient documentation

## 2012-03-21 DIAGNOSIS — E039 Hypothyroidism, unspecified: Secondary | ICD-10-CM | POA: Insufficient documentation

## 2012-03-21 DIAGNOSIS — M949 Disorder of cartilage, unspecified: Secondary | ICD-10-CM | POA: Insufficient documentation

## 2012-03-21 NOTE — Progress Notes (Signed)
Reviewed home exercise with pt today.  Pt plans to walk at home and in stores for exercise.  Pt is also considering joining the [employer].  Reviewed THR, pulse, RPE, sign and symptoms, and when to call 911 or MD.  Pt voiced understanding. Fabio Pierce, MA, ACSM RCEP

## 2012-03-23 ENCOUNTER — Encounter (HOSPITAL_COMMUNITY)
Admission: RE | Admit: 2012-03-23 | Discharge: 2012-03-23 | Disposition: A | Discharge status: Home / Self Care | Payer: Medicare Other | Source: Ambulatory Visit | Attending: Cardiology | Admitting: Cardiology

## 2012-03-24 ENCOUNTER — Other Ambulatory Visit: Payer: Self-pay | Admitting: Cardiology

## 2012-03-24 ENCOUNTER — Other Ambulatory Visit: Payer: Self-pay

## 2012-03-24 MED ORDER — POTASSIUM CHLORIDE CRYS ER 20 MEQ PO TBCR: 20.0000 meq | EXTENDED_RELEASE_TABLET | Freq: Every day | ORAL | Status: DC

## 2012-03-25 ENCOUNTER — Encounter (HOSPITAL_COMMUNITY)
Admission: RE | Admit: 2012-03-25 | Discharge: 2012-03-25 | Disposition: A | Discharge status: Home / Self Care | Payer: Medicare Other | Source: Ambulatory Visit | Attending: Cardiology | Admitting: Cardiology

## 2012-03-26 ENCOUNTER — Other Ambulatory Visit: Payer: Self-pay | Admitting: *Deleted

## 2012-03-28 ENCOUNTER — Encounter (HOSPITAL_COMMUNITY)
Admission: RE | Admit: 2012-03-28 | Discharge: 2012-03-28 | Disposition: A | Discharge status: Home / Self Care | Payer: Medicare Other | Source: Ambulatory Visit | Attending: Cardiology | Admitting: Cardiology

## 2012-03-28 NOTE — Progress Notes (Signed)
[name] [name] has a follow up appointment with Dr Myrtis Ser tomorrow. Faxed exercise flow sheets to Dr Myrtis Ser for review.

## 2012-03-29 ENCOUNTER — Ambulatory Visit (INDEPENDENT_AMBULATORY_CARE_PROVIDER_SITE_OTHER): Payer: Medicare Other | Admitting: Cardiology

## 2012-03-29 ENCOUNTER — Encounter: Payer: Self-pay | Admitting: Cardiology

## 2012-03-29 ENCOUNTER — Ambulatory Visit (INDEPENDENT_AMBULATORY_CARE_PROVIDER_SITE_OTHER): Payer: Medicare Other | Admitting: *Deleted

## 2012-03-29 ENCOUNTER — Other Ambulatory Visit (INDEPENDENT_AMBULATORY_CARE_PROVIDER_SITE_OTHER): Payer: Medicare Other

## 2012-03-29 VITALS — BP 110/60 | HR 90 | Ht 69.25 in | Wt 137.0 lb

## 2012-03-29 DIAGNOSIS — Z7901 Long term (current) use of anticoagulants: Secondary | ICD-10-CM

## 2012-03-29 DIAGNOSIS — Z9889 Other specified postprocedural states: Secondary | ICD-10-CM

## 2012-03-29 DIAGNOSIS — I4891 Unspecified atrial fibrillation: Secondary | ICD-10-CM | POA: Diagnosis not present

## 2012-03-29 DIAGNOSIS — I509 Heart failure, unspecified: Secondary | ICD-10-CM

## 2012-03-29 DIAGNOSIS — Z954 Presence of other heart-valve replacement: Secondary | ICD-10-CM

## 2012-03-29 DIAGNOSIS — Z952 Presence of prosthetic heart valve: Secondary | ICD-10-CM

## 2012-03-29 DIAGNOSIS — Z8679 Personal history of other diseases of the circulatory system: Secondary | ICD-10-CM

## 2012-03-29 DIAGNOSIS — I480 Paroxysmal atrial fibrillation: Secondary | ICD-10-CM

## 2012-03-29 DIAGNOSIS — R0989 Other specified symptoms and signs involving the circulatory and respiratory systems: Secondary | ICD-10-CM

## 2012-03-29 LAB — BASIC METABOLIC PANEL
BUN: 25 mg/dL — ABNORMAL HIGH (ref 6–23)
GFR: 80.49 mL/min (ref >60.00)
Potassium: 4.1 mEq/L (ref 3.5–5.1)
Sodium: 139 mEq/L (ref 135–145)

## 2012-03-29 LAB — POCT INR: INR: 2

## 2012-03-29 MED ORDER — FUROSEMIDE 20 MG PO TABS: ORAL_TABLET | ORAL | Status: DC

## 2012-03-29 MED ORDER — WARFARIN SODIUM 2.5 MG PO TABS: ORAL_TABLET | ORAL | Status: DC

## 2012-03-29 MED ORDER — POTASSIUM CHLORIDE CRYS ER 20 MEQ PO TBCR: 10.0000 meq | EXTENDED_RELEASE_TABLET | Freq: Every day | ORAL | Status: DC

## 2012-03-29 MED ORDER — AMIODARONE HCL 200 MG PO TABS: 100.0000 mg | ORAL_TABLET | Freq: Every day | ORAL | Status: DC

## 2012-03-29 NOTE — Assessment & Plan Note (Addendum)
Patient is holding sinus rhythm. We will continue to reduce her amiodarone. She is having some tingling in her feet. It's possible that amiodarone could be playing a role with this. Hopefully we'll get her ischemia. Soon.

## 2012-03-29 NOTE — Assessment & Plan Note (Signed)
Her fluid status is quite stable now. Will reduce Lasix to 20 mg daily and reduce potassium to 10 mEq daily.

## 2012-03-29 NOTE — Progress Notes (Signed)
HPI   The patient looks good today. She's getting stronger. She is maintaining sinus rhythm. She's not had any syncope or presyncope. There's been no chest pain. She has no edema.  Allergies  Allergen Reactions  . Erythromycin Nausea And Vomiting    Current Outpatient Prescriptions  Medication Sig Dispense Refill  . amiodarone (PACERONE) 200 MG tablet Take 1 tablet (200 mg total) by mouth daily.  30 tablet  5  . Black Cohosh 40 MG CAPS Take 1 capsule by mouth daily.      . Bromelains (BROMELAIN PO) Take 1 capsule by mouth 2 (two) times daily.      . Cholecalciferol (VITAMIN D) 2000 UNITS CAPS Take 6,000 Units by mouth daily.      . Coenzyme Q10 (CO Q-10) 100 MG CAPS Take 1 capsule by mouth daily.      . Cranberry 500 MG CAPS Take 1 capsule by mouth daily.      . feeding supplement (ENSURE COMPLETE) LIQD Take 237 mLs by mouth 2 (two) times daily between meals.      . furosemide (LASIX) 40 MG tablet Take one tablet by mouth once daily.      Marland Kitchen L-THEANINE PO Take 1 capsule by mouth 2 (two) times daily.      . Magnesium 200 MG TABS Take 400 mg by mouth 2 (two) times daily.      . metoprolol tartrate (LOPRESSOR) 25 MG tablet take 1/2 tablet by mouth twice a day  30 tablet  11  . MILK THISTLE PO Take 1 capsule by mouth 2 (two) times daily.      . Omega-3 Fatty Acids (FISH OIL PO) Take 2 capsules by mouth 2 (two) times daily.      Marland Kitchen OVER THE COUNTER MEDICATION Red marine algae - 2 capsules daily      . potassium chloride SA (K-DUR,KLOR-CON) 20 MEQ tablet Take 1 tablet (20 mEq total) by mouth daily.  30 tablet  0  . PROGESTERONE MICRONIZED PO Take 25 mg by mouth 2 (two) times daily.      Marland Kitchen thyroid (ARMOUR) 60 MG tablet Take 60 mg by mouth daily.      . vitamin A 04540 UNIT capsule Take 10,000 Units by mouth daily.      . vitamin E 400 UNIT capsule Take 400 Units by mouth daily.      Marland Kitchen warfarin (COUMADIN) 2.5 MG tablet Take as directed by coumadin clinic  30 tablet  3  . DISCONTD: warfarin  (COUMADIN) 2.5 MG tablet Take 1 tablet (2.5 mg total) by mouth daily at 6 PM.  30 tablet  1    History   Social History  . Marital Status: Widowed    Spouse Name: N/A    Number of Children: N/A  . Years of Education: N/A   Occupational History  . Not on file.   Social History Main Topics  . Smoking status: Never Smoker   . Smokeless tobacco: Never Used  . Alcohol Use: No  . Drug Use: No  . Sexually Active: Not on file   Other Topics Concern  . Not on file   Social History Narrative  . No narrative on file    No family history on file.  Past Medical History  Diagnosis Date  . Multiple sclerosis   . Breast cancer   . Ovarian cyst   . Hypothyroidism   . Mitral valve regurgitation     Mitral valve replacement April, 2013, Mitral  valve prolapse  . CHF (congestive heart failure)     Related to severe mitral regurgitation, April, 2013  . Shortness of breath     Related to severe mitral regurgitation, April, 2013  . Atrial fibrillation     Rapid atrial fibrillation in-hospital, Rapid cardioversion,  before mitral valve surgery  . Neuromuscular disorder     ms  . Arthritis     knees  . 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 S/P Maze operation for atrial fibrillation 01/26/2012    Complete biatrial lesion set using cryothermy via right mini thoracotomy  . Pulmonary hypertension     Echo, April, 2013, before mitral valve surgery  . 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 Assess by pulmonary team in the hospital April, 2013  . Anxiety   . Neurogenic bladder   . Ejection fraction     EF 60%, echo, April, 2013, with severe MR before mitral valve replacement  . Warfarin anticoagulation     Mechanical mitral prosthesis, April, [address]109    Past Surgical History  Procedure Date  . Breast lumpectomy   . Lymphadenectomy   . Wrist surgery   . Hernia repair     . Tonsillectomy   . Cystoscopy   . Laparoscopy   . Tee without cardioversion 01/19/2012    Procedure: TRANSESOPHAGEAL ECHOCARDIOGRAM (TEE);  Surgeon: Peter M Swaziland, MD;  Location[facility] ENDOSCOPY;  Service: Cardiovascular;  Laterality: N/A;  . Maze 01/26/2012    Procedure: MAZE;  Surgeon: Purcell Nails, MD;  Location[facility] OR;  Service: Open Heart Surgery;  Laterality: N/A;  . Mitral valve replacement 01/26/2012    Procedure: MINIMALLY INVASIVE MITRAL VALVE (MV) REPLACEMENT;  Surgeon: Purcell Nails, MD;  Location: [facility] OR;  Service: Open Heart Surgery;  Laterality: Right;  . Chest tube insertion 01/26/2012    Procedure: CHEST TUBE INSERTION;  Surgeon: Purcell Nails, MD;  Location: [facility] OR;  Service: Open Heart Surgery;  Laterality: Left;    ROS  Patient denies fever, chills, headache, sweats, rash, change in vision, change in hearing, chest pain, cough, nausea vomiting, urinary symptoms. All other systems are reviewed and are negative.   PHYSICAL EXAM Patient is oriented to person time and place. Affect is normal. There is no jugulovenous distention. Lungs are clear. Respiratory effort is nonlabored. Cardiac exam reveals a crisp mitral closure sound. There is no significant murmur. The abdomen is soft. She is no peripheral edema.  Filed Vitals:   03/29/12 1410  BP: 110/60  Pulse: 90  Height: 5' 9.25" (1.759 m)  Weight: 137 lb (62.143 kg)  SpO2: 97%     ASSESSMENT & PLAN

## 2012-03-29 NOTE — Assessment & Plan Note (Signed)
Patient is doing very well post mitral valve surgery. No further workup.

## 2012-03-29 NOTE — Patient Instructions (Signed)
Your physician recommends that you return for lab work in: today (bmet)  Your physician has recommended you make the following change in your medication: Cut your potassium in half and take one-half tab daily, cut your amiodarone in half and take one-half tab daily.  Decrease your lasix to 20mg  daily  Your physician recommends that you schedule a follow-up appointment in: 4-5 weeks

## 2012-03-29 NOTE — Assessment & Plan Note (Signed)
Patient's rhythm is regular. Amiodarone will be reduced to 100 mg daily today.

## 2012-03-30 ENCOUNTER — Encounter (HOSPITAL_COMMUNITY)
Admission: RE | Admit: 2012-03-30 | Discharge: 2012-03-30 | Disposition: A | Discharge status: Home / Self Care | Payer: Medicare Other | Source: Ambulatory Visit | Attending: Cardiology | Admitting: Cardiology

## 2012-03-30 ENCOUNTER — Telehealth: Payer: Self-pay | Admitting: Cardiology

## 2012-03-30 NOTE — Telephone Encounter (Signed)
Please return call to patient at [phone], regarding taking Advil with current medication Amiodarone.

## 2012-03-30 NOTE — Telephone Encounter (Signed)
No drug interaction found between tylenol and amiodarone found but I told her I wo9uld double check with Dr Myrtis Ser to make sure it is ok for her to take tylenol.  Pt states the question was for tylenol, not advil.

## 2012-03-31 NOTE — Telephone Encounter (Signed)
Okay to take Tylenol. As always in general, moderation in dosing of any medicine is appropriate.

## 2012-04-01 ENCOUNTER — Encounter (HOSPITAL_COMMUNITY)
Admission: RE | Admit: 2012-04-01 | Discharge: 2012-04-01 | Disposition: A | Discharge status: Home / Self Care | Payer: Medicare Other | Source: Ambulatory Visit | Attending: Cardiology | Admitting: Cardiology

## 2012-04-01 NOTE — Telephone Encounter (Signed)
Pt was notified.  

## 2012-04-04 ENCOUNTER — Encounter (HOSPITAL_COMMUNITY): Payer: Medicare Other

## 2012-04-04 ENCOUNTER — Encounter (HOSPITAL_COMMUNITY)
Admission: RE | Admit: 2012-04-04 | Discharge: 2012-04-04 | Disposition: A | Discharge status: Home / Self Care | Payer: Medicare Other | Source: Ambulatory Visit | Attending: Cardiology | Admitting: Cardiology

## 2012-04-06 ENCOUNTER — Encounter (HOSPITAL_COMMUNITY)
Admission: RE | Admit: 2012-04-06 | Discharge: 2012-04-06 | Disposition: A | Discharge status: Home / Self Care | Payer: Medicare Other | Source: Ambulatory Visit | Attending: Cardiology | Admitting: Cardiology

## 2012-04-06 ENCOUNTER — Telehealth: Payer: Self-pay

## 2012-04-06 NOTE — Telephone Encounter (Signed)
YES, stop potassium

## 2012-04-06 NOTE — Telephone Encounter (Signed)
[name] [name] states that at her last appt,  Dr Myrtis Ser told her to cut her lasix in half for 2 weeks and then stop it.  She wants to know if she should stop her potassium also?  K+ was 4.1.

## 2012-04-08 ENCOUNTER — Ambulatory Visit (INDEPENDENT_AMBULATORY_CARE_PROVIDER_SITE_OTHER): Payer: Medicare Other | Admitting: *Deleted

## 2012-04-08 ENCOUNTER — Encounter (HOSPITAL_COMMUNITY)
Admission: RE | Admit: 2012-04-08 | Discharge: 2012-04-08 | Disposition: A | Discharge status: Home / Self Care | Payer: Medicare Other | Source: Ambulatory Visit | Attending: Cardiology | Admitting: Cardiology

## 2012-04-08 DIAGNOSIS — Z9889 Other specified postprocedural states: Secondary | ICD-10-CM

## 2012-04-08 DIAGNOSIS — I509 Heart failure, unspecified: Secondary | ICD-10-CM | POA: Diagnosis not present

## 2012-04-08 DIAGNOSIS — Z954 Presence of other heart-valve replacement: Secondary | ICD-10-CM | POA: Diagnosis not present

## 2012-04-08 DIAGNOSIS — Z952 Presence of prosthetic heart valve: Secondary | ICD-10-CM

## 2012-04-08 DIAGNOSIS — Z7901 Long term (current) use of anticoagulants: Secondary | ICD-10-CM

## 2012-04-08 DIAGNOSIS — I4891 Unspecified atrial fibrillation: Secondary | ICD-10-CM

## 2012-04-08 DIAGNOSIS — Z8679 Personal history of other diseases of the circulatory system: Secondary | ICD-10-CM

## 2012-04-08 LAB — POCT INR: INR: 3.1

## 2012-04-11 ENCOUNTER — Encounter (HOSPITAL_COMMUNITY)
Admission: RE | Admit: 2012-04-11 | Discharge: 2012-04-11 | Disposition: A | Discharge status: Home / Self Care | Payer: Medicare Other | Source: Ambulatory Visit | Attending: Cardiology | Admitting: Cardiology

## 2012-04-11 NOTE — Telephone Encounter (Signed)
Pt was notified.  

## 2012-04-13 ENCOUNTER — Encounter (HOSPITAL_COMMUNITY)
Admission: RE | Admit: 2012-04-13 | Discharge: 2012-04-13 | Disposition: A | Discharge status: Home / Self Care | Payer: Medicare Other | Source: Ambulatory Visit | Attending: Cardiology | Admitting: Cardiology

## 2012-04-15 ENCOUNTER — Encounter (HOSPITAL_COMMUNITY)
Admission: RE | Admit: 2012-04-15 | Discharge: 2012-04-15 | Disposition: A | Discharge status: Home / Self Care | Payer: Medicare Other | Source: Ambulatory Visit | Attending: Cardiology | Admitting: Cardiology

## 2012-04-18 ENCOUNTER — Encounter (HOSPITAL_COMMUNITY)
Admission: RE | Admit: 2012-04-18 | Discharge: 2012-04-18 | Disposition: A | Discharge status: Home / Self Care | Payer: Medicare Other | Source: Ambulatory Visit | Attending: Cardiology | Admitting: Cardiology

## 2012-04-18 DIAGNOSIS — I440 Atrioventricular block, first degree: Secondary | ICD-10-CM | POA: Insufficient documentation

## 2012-04-18 DIAGNOSIS — I4891 Unspecified atrial fibrillation: Secondary | ICD-10-CM | POA: Insufficient documentation

## 2012-04-18 DIAGNOSIS — I059 Rheumatic mitral valve disease, unspecified: Secondary | ICD-10-CM | POA: Insufficient documentation

## 2012-04-18 DIAGNOSIS — F411 Generalized anxiety disorder: Secondary | ICD-10-CM | POA: Insufficient documentation

## 2012-04-18 DIAGNOSIS — Z853 Personal history of malignant neoplasm of breast: Secondary | ICD-10-CM | POA: Diagnosis not present

## 2012-04-18 DIAGNOSIS — E039 Hypothyroidism, unspecified: Secondary | ICD-10-CM | POA: Diagnosis not present

## 2012-04-18 DIAGNOSIS — I509 Heart failure, unspecified: Secondary | ICD-10-CM | POA: Insufficient documentation

## 2012-04-18 DIAGNOSIS — Z79899 Other long term (current) drug therapy: Secondary | ICD-10-CM | POA: Insufficient documentation

## 2012-04-18 DIAGNOSIS — J438 Other emphysema: Secondary | ICD-10-CM | POA: Insufficient documentation

## 2012-04-18 DIAGNOSIS — I2789 Other specified pulmonary heart diseases: Secondary | ICD-10-CM | POA: Diagnosis not present

## 2012-04-18 DIAGNOSIS — Z881 Allergy status to other antibiotic agents status: Secondary | ICD-10-CM | POA: Insufficient documentation

## 2012-04-18 DIAGNOSIS — Z7901 Long term (current) use of anticoagulants: Secondary | ICD-10-CM | POA: Insufficient documentation

## 2012-04-18 DIAGNOSIS — Z5189 Encounter for other specified aftercare: Secondary | ICD-10-CM | POA: Insufficient documentation

## 2012-04-18 DIAGNOSIS — M899 Disorder of bone, unspecified: Secondary | ICD-10-CM | POA: Insufficient documentation

## 2012-04-18 DIAGNOSIS — M949 Disorder of cartilage, unspecified: Secondary | ICD-10-CM | POA: Insufficient documentation

## 2012-04-18 DIAGNOSIS — R000 Tachycardia, unspecified: Secondary | ICD-10-CM | POA: Diagnosis not present

## 2012-04-18 DIAGNOSIS — Z9889 Other specified postprocedural states: Secondary | ICD-10-CM | POA: Diagnosis not present

## 2012-04-18 DIAGNOSIS — G35 Multiple sclerosis: Secondary | ICD-10-CM | POA: Diagnosis not present

## 2012-04-20 ENCOUNTER — Encounter (HOSPITAL_COMMUNITY)
Admission: RE | Admit: 2012-04-20 | Discharge: 2012-04-20 | Disposition: A | Discharge status: Home / Self Care | Payer: Medicare Other | Source: Ambulatory Visit | Attending: Cardiology | Admitting: Cardiology

## 2012-04-22 ENCOUNTER — Encounter (HOSPITAL_COMMUNITY)
Admission: RE | Admit: 2012-04-22 | Discharge: 2012-04-22 | Disposition: A | Discharge status: Home / Self Care | Payer: Medicare Other | Source: Ambulatory Visit | Attending: Cardiology | Admitting: Cardiology

## 2012-04-25 ENCOUNTER — Encounter (HOSPITAL_COMMUNITY)
Admission: RE | Admit: 2012-04-25 | Discharge: 2012-04-25 | Disposition: A | Discharge status: Home / Self Care | Payer: Medicare Other | Source: Ambulatory Visit | Attending: Cardiology | Admitting: Cardiology

## 2012-04-25 ENCOUNTER — Ambulatory Visit: Payer: Medicare Other | Admitting: Thoracic Surgery (Cardiothoracic Vascular Surgery)

## 2012-04-27 ENCOUNTER — Encounter (HOSPITAL_COMMUNITY)
Admission: RE | Admit: 2012-04-27 | Discharge: 2012-04-27 | Disposition: A | Discharge status: Home / Self Care | Payer: Medicare Other | Source: Ambulatory Visit | Attending: Cardiology | Admitting: Cardiology

## 2012-04-29 ENCOUNTER — Ambulatory Visit (INDEPENDENT_AMBULATORY_CARE_PROVIDER_SITE_OTHER): Payer: Medicare Other | Admitting: *Deleted

## 2012-04-29 ENCOUNTER — Encounter (HOSPITAL_COMMUNITY)
Admission: RE | Admit: 2012-04-29 | Discharge: 2012-04-29 | Disposition: A | Discharge status: Home / Self Care | Payer: Medicare Other | Source: Ambulatory Visit | Attending: Cardiology | Admitting: Cardiology

## 2012-04-29 DIAGNOSIS — I509 Heart failure, unspecified: Secondary | ICD-10-CM

## 2012-04-29 DIAGNOSIS — Z952 Presence of prosthetic heart valve: Secondary | ICD-10-CM

## 2012-04-29 DIAGNOSIS — Z954 Presence of other heart-valve replacement: Secondary | ICD-10-CM | POA: Diagnosis not present

## 2012-04-29 DIAGNOSIS — Z9889 Other specified postprocedural states: Secondary | ICD-10-CM

## 2012-04-29 DIAGNOSIS — Z7901 Long term (current) use of anticoagulants: Secondary | ICD-10-CM

## 2012-04-29 DIAGNOSIS — I4891 Unspecified atrial fibrillation: Secondary | ICD-10-CM

## 2012-05-02 ENCOUNTER — Telehealth: Payer: Self-pay | Admitting: Cardiology

## 2012-05-02 ENCOUNTER — Encounter (HOSPITAL_COMMUNITY)
Admission: RE | Admit: 2012-05-02 | Discharge: 2012-05-02 | Disposition: A | Discharge status: Home / Self Care | Payer: Medicare Other | Source: Ambulatory Visit | Attending: Cardiology | Admitting: Cardiology

## 2012-05-02 NOTE — Telephone Encounter (Signed)
Pt is on amilodipine and she is out and needs to know should she get a refill or wait

## 2012-05-02 NOTE — Telephone Encounter (Signed)
Patient calling regarding Amiodarone, not Amlodipine.  Patient had been taking Amiodarone 200 mg until last office visit and was advised to decrease to 100 mg and trying to continue to discontinue completely.  Patient took last 100 mg today and has an appointment Wednesday with Dr Myrtis Ser.  Patient is wanting to know if she should get refilled or just what until she sees Dr Myrtis Ser on Friday.  Will forward to Dr Myrtis Ser and Eunice Blase L. LPN

## 2012-05-03 NOTE — Telephone Encounter (Signed)
Pt was notified.  

## 2012-05-03 NOTE — Telephone Encounter (Signed)
Okay to stop amiodarone. Do not refill the amiodarone and I'll see her back in the office soon

## 2012-05-04 ENCOUNTER — Encounter (HOSPITAL_COMMUNITY)
Admission: RE | Admit: 2012-05-04 | Discharge: 2012-05-04 | Disposition: A | Discharge status: Home / Self Care | Payer: Medicare Other | Source: Ambulatory Visit | Attending: Cardiology | Admitting: Cardiology

## 2012-05-04 ENCOUNTER — Encounter: Payer: Self-pay | Admitting: Cardiology

## 2012-05-04 ENCOUNTER — Ambulatory Visit (INDEPENDENT_AMBULATORY_CARE_PROVIDER_SITE_OTHER): Payer: Medicare Other | Admitting: Cardiology

## 2012-05-04 VITALS — BP 80/52 | HR 104 | Resp 12 | Wt 141.0 lb

## 2012-05-04 DIAGNOSIS — I509 Heart failure, unspecified: Secondary | ICD-10-CM

## 2012-05-04 DIAGNOSIS — Z954 Presence of other heart-valve replacement: Secondary | ICD-10-CM

## 2012-05-04 DIAGNOSIS — Z952 Presence of prosthetic heart valve: Secondary | ICD-10-CM

## 2012-05-04 DIAGNOSIS — R000 Tachycardia, unspecified: Secondary | ICD-10-CM | POA: Diagnosis not present

## 2012-05-04 MED ORDER — METOPROLOL TARTRATE 25 MG PO TABS: 25.0000 mg | ORAL_TABLET | Freq: Two times a day (BID) | ORAL | Status: DC

## 2012-05-04 MED ORDER — METOPROLOL TARTRATE 50 MG PO TABS: 25.0000 mg | ORAL_TABLET | Freq: Two times a day (BID) | ORAL | Status: DC

## 2012-05-04 NOTE — Assessment & Plan Note (Signed)
Patient has mild persistent sinus tachycardia with a rate of 103 today. Her amiodarone was just stopped. I plan to continue her low-dose beta blocker for now.

## 2012-05-04 NOTE — Assessment & Plan Note (Signed)
Hopefully she'll remain in sinus rhythm off of amiodarone.

## 2012-05-04 NOTE — Progress Notes (Signed)
HPI Patient returns for followup of her mitral regurgitation and fluid overload and atrial fibrillation. I have lowered her amiodarone dose to 100 mg. She ran out the other day and we decided to stop it. She is maintaining sinus rhythm. She is definitely getting stronger looking better. Her blood pressure continues to Ron the low side. This has been true historically. She is watching her salt intake. Early post hospitalization we did have some fluid overload problems.  Allergies  Allergen Reactions  . Erythromycin Nausea And Vomiting    Current Outpatient Prescriptions  Medication Sig Dispense Refill  . Black Cohosh 40 MG CAPS Take 1 capsule by mouth daily.      . Bromelains (BROMELAIN PO) Take 1 capsule by mouth 2 (two) times daily.      . Cholecalciferol (VITAMIN D) 2000 UNITS CAPS Take 6,000 Units by mouth daily.      . Coenzyme Q10 (CO Q-10) 100 MG CAPS Take 1 capsule by mouth 2 (two) times daily.       . Cranberry 500 MG CAPS Take 1 capsule by mouth daily.      Marland Kitchen L-THEANINE PO Take 1 capsule by mouth daily.       Marland Kitchen lactose free nutrition (BOOST PLUS) LIQD Take 237 mLs by mouth 2 (two) times daily between meals.      . Lysine 500 MG CAPS Take 1 capsule by mouth daily.      . Magnesium 200 MG TABS Take 400 mg by mouth 2 (two) times daily.      . metoprolol tartrate (LOPRESSOR) 25 MG tablet take 1/2 tablet by mouth twice a day  30 tablet  11  . MILK THISTLE PO Take 1 capsule by mouth 2 (two) times daily.      . Omega-3 Fatty Acids (FISH OIL PO) Take 2 capsules by mouth 2 (two) times daily.      Marland Kitchen OVER THE COUNTER MEDICATION Red marine algae - 2 capsules daily      . PROGESTERONE MICRONIZED PO Take 25 mg by mouth 2 (two) times daily.      Marland Kitchen thyroid (ARMOUR) 60 MG tablet Take 60 mg by mouth daily.      . vitamin A 45409 UNIT capsule Take 10,000 Units by mouth daily.      . vitamin E 400 UNIT capsule Take 400 Units by mouth daily.      Marland Kitchen warfarin (COUMADIN) 2.5 MG tablet Take as  directed by coumadin clinic  30 tablet  3  . amiodarone (PACERONE) 200 MG tablet Take 0.5 tablets (100 mg total) by mouth daily.  30 tablet  3  . feeding supplement (ENSURE COMPLETE) LIQD Take 237 mLs by mouth 2 (two) times daily between meals.      . furosemide (LASIX) 20 MG tablet Take one tablet by mouth once daily.  30 tablet  3  . potassium chloride SA (K-DUR,KLOR-CON) 20 MEQ tablet Take 0.5 tablets (10 mEq total) by mouth daily.  30 tablet  0    History   Social History  . Marital Status: Widowed    Spouse Name: N/A    Number of Children: N/A  . Years of Education: N/A   Occupational History  . Not on file.   Social History Main Topics  . Smoking status: Never Smoker   . Smokeless tobacco: Never Used  . Alcohol Use: No  . Drug Use: No  . Sexually Active: Not on file   Other Topics Concern  .  Not on file   Social History Narrative  . No narrative on file    No family history on file.  Past Medical History  Diagnosis Date  . Multiple sclerosis   . Breast cancer   . Ovarian cyst   . Hypothyroidism   . Mitral valve regurgitation     Mitral valve replacement April, 2013, Mitral valve prolapse  . CHF (congestive heart failure)     Related to severe mitral regurgitation, April, 2013  . Shortness of breath     Related to severe mitral regurgitation, April, 2013  . Paroxysmal atrial fibrillation     Rapid atrial fibrillation in-hospital, Rapid cardioversion,  before mitral valve surgery  . Neuromuscular disorder     ms  . Arthritis     knees  . 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 S/P Maze operation for atrial fibrillation 01/26/2012    Complete biatrial lesion set using cryothermy via right mini thoracotomy  . Pulmonary hypertension     Echo, April, 2013, before mitral valve surgery  . 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 Assess by pulmonary  team in the hospital April, 2013  . Anxiety   . Neurogenic bladder   . Ejection fraction     EF 60%, echo, April, 2013, with severe MR before mitral valve replacement  . Warfarin anticoagulation     Mechanical mitral prosthesis, April, 54098    Past Surgical History  Procedure Date  . Breast lumpectomy   . Lymphadenectomy   . Wrist surgery   . Hernia repair   . Tonsillectomy   . Cystoscopy   . Laparoscopy   . Tee without cardioversion 01/19/2012    Procedure: TRANSESOPHAGEAL ECHOCARDIOGRAM (TEE);  Surgeon: Peter M Swaziland, MD;  Location: [facility] ENDOSCOPY;  Service: Cardiovascular;  Laterality: N/A;  . Maze 01/26/2012    Procedure: MAZE;  Surgeon: Purcell Nails, MD;  Location: [facility] OR;  Service: Open Heart Surgery;  Laterality: N/A;  . Mitral valve replacement 01/26/2012    Procedure: MINIMALLY INVASIVE MITRAL VALVE (MV) REPLACEMENT;  Surgeon: Purcell Nails, MD;  Location: [facility] OR;  Service: Open Heart Surgery;  Laterality: Right;  . Chest tube insertion 01/26/2012    Procedure: CHEST TUBE INSERTION;  Surgeon: Purcell Nails, MD;  Location: [facility] OR;  Service: Open Heart Surgery;  Laterality: Left;    ROS  Patient denies fever, chills, headache, sweats, rash, change in vision, change in hearing, chest pain, cough, nausea vomiting, urinary symptoms. All other systems are reviewed and are negative.  PHYSICAL EXAM Patient is oriented to person time and place. Affect is normal. Lungs are clear. Respiratory effort is nonlabored. Cardiac exam reveals S1 and S2. The mitral valve closure sound is crisp. No murmurs are heard. Abdomen is soft. There is no significant peripheral edema.  Filed Vitals:   05/04/12 1141  BP: 80/52  Pulse: 104  Resp: 12  Weight: 141 lb (63.957 kg)     ASSESSMENT & PLAN

## 2012-05-04 NOTE — Assessment & Plan Note (Signed)
The patient's volume status is now stable. Her blood pressure is on the low side. I will allow her to start to liberalize her salt intake a little bit.

## 2012-05-04 NOTE — Patient Instructions (Addendum)
Your physician recommends that you schedule a follow-up appointment in: 8 weeks  

## 2012-05-04 NOTE — Assessment & Plan Note (Signed)
The patient is doing extremely well after mitral valve replacement. Mitral valve repair could not be done. She continues to improve. She is now off Lasix and off amiodarone.

## 2012-05-05 ENCOUNTER — Telehealth: Payer: Self-pay | Admitting: Cardiology

## 2012-05-05 NOTE — Telephone Encounter (Signed)
New msg Pt was here yesterday and is confused about what mg of metoprolol she is to take 25mg  or 50 mg twice a day. Please call her back

## 2012-05-05 NOTE — Telephone Encounter (Signed)
Metoprolol dosage reviewed with pt.

## 2012-05-06 ENCOUNTER — Encounter (HOSPITAL_COMMUNITY)
Admission: RE | Admit: 2012-05-06 | Discharge: 2012-05-06 | Disposition: A | Discharge status: Home / Self Care | Payer: Medicare Other | Source: Ambulatory Visit | Attending: Cardiology | Admitting: Cardiology

## 2012-05-06 NOTE — Progress Notes (Signed)
Dr Myrtis Ser increased [name] [name]'s metoprolol to 25 mg twice a day.  Will monitor blood pressure at cardiac rehab.

## 2012-05-09 ENCOUNTER — Ambulatory Visit (INDEPENDENT_AMBULATORY_CARE_PROVIDER_SITE_OTHER): Payer: Medicare Other | Admitting: Thoracic Surgery (Cardiothoracic Vascular Surgery)

## 2012-05-09 ENCOUNTER — Encounter (HOSPITAL_COMMUNITY)
Admission: RE | Admit: 2012-05-09 | Discharge: 2012-05-09 | Disposition: A | Discharge status: Home / Self Care | Payer: Medicare Other | Source: Ambulatory Visit | Attending: Cardiology | Admitting: Cardiology

## 2012-05-09 ENCOUNTER — Encounter: Payer: Self-pay | Admitting: Thoracic Surgery (Cardiothoracic Vascular Surgery)

## 2012-05-09 VITALS — BP 113/67 | HR 80 | Resp 20 | Ht 69.0 in | Wt 143.0 lb

## 2012-05-09 DIAGNOSIS — Z952 Presence of prosthetic heart valve: Secondary | ICD-10-CM

## 2012-05-09 DIAGNOSIS — Z9889 Other specified postprocedural states: Secondary | ICD-10-CM

## 2012-05-09 DIAGNOSIS — Z954 Presence of other heart-valve replacement: Secondary | ICD-10-CM | POA: Diagnosis not present

## 2012-05-09 DIAGNOSIS — Z8679 Personal history of other diseases of the circulatory system: Secondary | ICD-10-CM

## 2012-05-09 NOTE — Progress Notes (Signed)
[address]            [place] [address]          [phone]     CARDIOTHORACIC SURGERY OFFICE NOTE  Referring Provider is Willa Rough, MD PCP is Henri Medal, MD   HPI:  Patient returns for followup status post Minimally Invasive Mitral Valve replacement and maze procedure on 01/26/2012.  They were last seen here in the office on 02/22/2012. Patient no longer has pain or soreness related to the surgery.  She is been doing well with cardiac rehabilitation program and her exercise tolerance has continued to improve nicely.  She denies any shortness of breath. She's not having any tachypalpitations.  She has not had any Coumadin related problems and she continues to have it carefully monitored through the [facility].    Current Outpatient Prescriptions  Medication Sig Dispense Refill  . Black Cohosh 40 MG CAPS Take 1 capsule by mouth daily.      . Bromelains (BROMELAIN PO) Take 1 capsule by mouth 2 (two) times daily.      . Cholecalciferol (VITAMIN D) 2000 UNITS CAPS Take 6,000 Units by mouth daily.      . Coenzyme Q10 (CO Q-10) 100 MG CAPS Take 1 capsule by mouth 2 (two) times daily.       . Cranberry 500 MG CAPS Take 1 capsule by mouth daily.      . feeding supplement (ENSURE COMPLETE) LIQD Take 237 mLs by mouth 2 (two) times daily between meals.      Marland Kitchen L-THEANINE PO Take 1 capsule by mouth daily.       Marland Kitchen lactose free nutrition (BOOST PLUS) LIQD Take 237 mLs by mouth 2 (two) times daily between meals.      . Lysine 500 MG CAPS Take 1 capsule by mouth daily.      . Magnesium 200 MG TABS Take 400 mg by mouth 2 (two) times daily.      . metoprolol tartrate (LOPRESSOR) 50 MG tablet Take 0.5 tablets (25 mg total) by mouth 2 (two) times daily.  30 tablet  11  . MILK THISTLE PO Take 1 capsule by mouth 2 (two) times daily.      . Omega-3 Fatty Acids (FISH OIL PO) Take 2 capsules by mouth 2 (two) times daily.      Marland Kitchen OVER THE COUNTER  MEDICATION Red marine algae - 2 capsules daily      . PROGESTERONE MICRONIZED PO Take 25 mg by mouth 2 (two) times daily.      Marland Kitchen thyroid (ARMOUR) 60 MG tablet Take 60 mg by mouth daily.      . vitamin A 91478 UNIT capsule Take 10,000 Units by mouth daily.      . vitamin E 400 UNIT capsule Take 400 Units by mouth daily.      Marland Kitchen warfarin (COUMADIN) 2.5 MG tablet Take as directed by coumadin clinic  30 tablet  3  . amiodarone (PACERONE) 200 MG tablet Take 0.5 tablets (100 mg total) by mouth daily.  30 tablet  3  . furosemide (LASIX) 20 MG tablet Take one tablet by mouth once daily.  30 tablet  3  . potassium chloride SA (K-DUR,KLOR-CON) 20 MEQ tablet Take 0.5 tablets (10 mEq total) by mouth daily.  30 tablet  0      Physical Exam:   BP  113/67  Pulse 80  Resp 20  Ht 5\' 9"  (1.753 m)  Wt 64.864 kg (143 lb)  BMI 21.12 kg/m2  SpO2 97%  General:  Patient looks great overall  Chest:   Clear and symmetrical  CV:   RRR no murmur  Incisions:  Healing very well  Abdomen:  soft  Extremities:  warm  Diagnostic Tests:  2 channel telemetry rhythm strip demonstrates normal sinus rhythm  Impression:  The patient is doing well more than 3 months status post minimally invasive mitral valve replacement and Maze procedure. She is maintaining sinus rhythm. Her exercise tolerance continues to improve.   Plan:  The patient will return for followup and rhythm check in 3 months.   Salvatore Decent. Cornelius Moras, MD 05/09/2012 4:16 PM

## 2012-05-11 ENCOUNTER — Encounter (HOSPITAL_COMMUNITY)
Admission: RE | Admit: 2012-05-11 | Discharge: 2012-05-11 | Disposition: A | Discharge status: Home / Self Care | Payer: Medicare Other | Source: Ambulatory Visit | Attending: Cardiology | Admitting: Cardiology

## 2012-05-13 ENCOUNTER — Ambulatory Visit (INDEPENDENT_AMBULATORY_CARE_PROVIDER_SITE_OTHER): Payer: Medicare Other | Admitting: *Deleted

## 2012-05-13 ENCOUNTER — Encounter (HOSPITAL_COMMUNITY)
Admission: RE | Admit: 2012-05-13 | Discharge: 2012-05-13 | Disposition: A | Discharge status: Home / Self Care | Payer: Medicare Other | Source: Ambulatory Visit | Attending: Cardiology | Admitting: Cardiology

## 2012-05-13 DIAGNOSIS — I4891 Unspecified atrial fibrillation: Secondary | ICD-10-CM | POA: Diagnosis not present

## 2012-05-13 DIAGNOSIS — I509 Heart failure, unspecified: Secondary | ICD-10-CM

## 2012-05-13 DIAGNOSIS — Z952 Presence of prosthetic heart valve: Secondary | ICD-10-CM

## 2012-05-13 DIAGNOSIS — Z954 Presence of other heart-valve replacement: Secondary | ICD-10-CM

## 2012-05-13 DIAGNOSIS — Z9889 Other specified postprocedural states: Secondary | ICD-10-CM

## 2012-05-13 DIAGNOSIS — Z7901 Long term (current) use of anticoagulants: Secondary | ICD-10-CM | POA: Diagnosis not present

## 2012-05-13 DIAGNOSIS — Z8679 Personal history of other diseases of the circulatory system: Secondary | ICD-10-CM

## 2012-05-16 ENCOUNTER — Encounter (HOSPITAL_COMMUNITY)
Admission: RE | Admit: 2012-05-16 | Discharge: 2012-05-16 | Disposition: A | Discharge status: Home / Self Care | Payer: Medicare Other | Source: Ambulatory Visit | Attending: Cardiology | Admitting: Cardiology

## 2012-05-18 ENCOUNTER — Encounter (HOSPITAL_COMMUNITY)
Admission: RE | Admit: 2012-05-18 | Discharge: 2012-05-18 | Disposition: A | Discharge status: Home / Self Care | Payer: Medicare Other | Source: Ambulatory Visit | Attending: Cardiology | Admitting: Cardiology

## 2012-05-19 DIAGNOSIS — G35 Multiple sclerosis: Secondary | ICD-10-CM | POA: Diagnosis not present

## 2012-05-20 ENCOUNTER — Encounter (HOSPITAL_COMMUNITY)
Admission: RE | Admit: 2012-05-20 | Discharge: 2012-05-20 | Disposition: A | Discharge status: Home / Self Care | Payer: Medicare Other | Source: Ambulatory Visit | Attending: Cardiology | Admitting: Cardiology

## 2012-05-20 DIAGNOSIS — Z9889 Other specified postprocedural states: Secondary | ICD-10-CM | POA: Diagnosis not present

## 2012-05-20 DIAGNOSIS — J438 Other emphysema: Secondary | ICD-10-CM | POA: Insufficient documentation

## 2012-05-20 DIAGNOSIS — Z79899 Other long term (current) drug therapy: Secondary | ICD-10-CM | POA: Diagnosis not present

## 2012-05-20 DIAGNOSIS — I2789 Other specified pulmonary heart diseases: Secondary | ICD-10-CM | POA: Diagnosis not present

## 2012-05-20 DIAGNOSIS — I440 Atrioventricular block, first degree: Secondary | ICD-10-CM | POA: Diagnosis not present

## 2012-05-20 DIAGNOSIS — I509 Heart failure, unspecified: Secondary | ICD-10-CM | POA: Insufficient documentation

## 2012-05-20 DIAGNOSIS — Z7901 Long term (current) use of anticoagulants: Secondary | ICD-10-CM | POA: Diagnosis not present

## 2012-05-20 DIAGNOSIS — R000 Tachycardia, unspecified: Secondary | ICD-10-CM | POA: Diagnosis not present

## 2012-05-20 DIAGNOSIS — Z881 Allergy status to other antibiotic agents status: Secondary | ICD-10-CM | POA: Diagnosis not present

## 2012-05-20 DIAGNOSIS — I059 Rheumatic mitral valve disease, unspecified: Secondary | ICD-10-CM | POA: Insufficient documentation

## 2012-05-20 DIAGNOSIS — G35 Multiple sclerosis: Secondary | ICD-10-CM | POA: Diagnosis not present

## 2012-05-20 DIAGNOSIS — I4891 Unspecified atrial fibrillation: Secondary | ICD-10-CM | POA: Diagnosis not present

## 2012-05-20 DIAGNOSIS — Z5189 Encounter for other specified aftercare: Secondary | ICD-10-CM | POA: Diagnosis not present

## 2012-05-20 DIAGNOSIS — F411 Generalized anxiety disorder: Secondary | ICD-10-CM | POA: Insufficient documentation

## 2012-05-20 DIAGNOSIS — M899 Disorder of bone, unspecified: Secondary | ICD-10-CM | POA: Diagnosis not present

## 2012-05-20 DIAGNOSIS — E039 Hypothyroidism, unspecified: Secondary | ICD-10-CM | POA: Insufficient documentation

## 2012-05-20 DIAGNOSIS — Z853 Personal history of malignant neoplasm of breast: Secondary | ICD-10-CM | POA: Diagnosis not present

## 2012-05-23 ENCOUNTER — Encounter (HOSPITAL_COMMUNITY)
Admission: RE | Admit: 2012-05-23 | Discharge: 2012-05-23 | Disposition: A | Discharge status: Home / Self Care | Payer: Medicare Other | Source: Ambulatory Visit | Attending: Cardiology | Admitting: Cardiology

## 2012-05-23 ENCOUNTER — Ambulatory Visit (INDEPENDENT_AMBULATORY_CARE_PROVIDER_SITE_OTHER): Payer: Medicare Other | Admitting: Pharmacist

## 2012-05-23 DIAGNOSIS — Z954 Presence of other heart-valve replacement: Secondary | ICD-10-CM | POA: Diagnosis not present

## 2012-05-23 DIAGNOSIS — Z7901 Long term (current) use of anticoagulants: Secondary | ICD-10-CM | POA: Diagnosis not present

## 2012-05-23 DIAGNOSIS — I509 Heart failure, unspecified: Secondary | ICD-10-CM

## 2012-05-23 DIAGNOSIS — Z8679 Personal history of other diseases of the circulatory system: Secondary | ICD-10-CM

## 2012-05-23 DIAGNOSIS — I4891 Unspecified atrial fibrillation: Secondary | ICD-10-CM | POA: Diagnosis not present

## 2012-05-23 DIAGNOSIS — Z952 Presence of prosthetic heart valve: Secondary | ICD-10-CM

## 2012-05-23 DIAGNOSIS — Z9889 Other specified postprocedural states: Secondary | ICD-10-CM

## 2012-05-23 LAB — POCT INR: INR: 1.5

## 2012-05-25 ENCOUNTER — Encounter (HOSPITAL_COMMUNITY)
Admission: RE | Admit: 2012-05-25 | Discharge: 2012-05-25 | Disposition: A | Discharge status: Home / Self Care | Payer: Medicare Other | Source: Ambulatory Visit | Attending: Cardiology | Admitting: Cardiology

## 2012-05-27 ENCOUNTER — Encounter (HOSPITAL_COMMUNITY)
Admission: RE | Admit: 2012-05-27 | Discharge: 2012-05-27 | Disposition: A | Discharge status: Home / Self Care | Payer: Medicare Other | Source: Ambulatory Visit | Attending: Cardiology | Admitting: Cardiology

## 2012-05-30 ENCOUNTER — Encounter (HOSPITAL_COMMUNITY)
Admission: RE | Admit: 2012-05-30 | Discharge: 2012-05-30 | Disposition: A | Discharge status: Home / Self Care | Payer: Medicare Other | Source: Ambulatory Visit | Attending: Cardiology | Admitting: Cardiology

## 2012-05-30 NOTE — Progress Notes (Signed)
[employer] today she plans to continue exercise in the maintenance program

## 2012-06-01 ENCOUNTER — Encounter (HOSPITAL_COMMUNITY): Payer: Medicare Other

## 2012-06-03 ENCOUNTER — Encounter (HOSPITAL_COMMUNITY)
Admission: RE | Admit: 2012-06-03 | Discharge: 2012-06-03 | Disposition: A | Discharge status: Home / Self Care | Payer: Self-pay | Source: Ambulatory Visit | Attending: Cardiology | Admitting: Cardiology

## 2012-06-03 ENCOUNTER — Encounter (HOSPITAL_COMMUNITY): Payer: Medicare Other

## 2012-06-03 ENCOUNTER — Ambulatory Visit (INDEPENDENT_AMBULATORY_CARE_PROVIDER_SITE_OTHER): Payer: Medicare Other | Admitting: *Deleted

## 2012-06-03 DIAGNOSIS — I509 Heart failure, unspecified: Secondary | ICD-10-CM | POA: Insufficient documentation

## 2012-06-03 DIAGNOSIS — I4891 Unspecified atrial fibrillation: Secondary | ICD-10-CM | POA: Insufficient documentation

## 2012-06-03 DIAGNOSIS — Z7901 Long term (current) use of anticoagulants: Secondary | ICD-10-CM

## 2012-06-03 DIAGNOSIS — Z954 Presence of other heart-valve replacement: Secondary | ICD-10-CM | POA: Diagnosis not present

## 2012-06-03 DIAGNOSIS — R000 Tachycardia, unspecified: Secondary | ICD-10-CM | POA: Insufficient documentation

## 2012-06-03 DIAGNOSIS — Z853 Personal history of malignant neoplasm of breast: Secondary | ICD-10-CM | POA: Insufficient documentation

## 2012-06-03 DIAGNOSIS — M899 Disorder of bone, unspecified: Secondary | ICD-10-CM | POA: Insufficient documentation

## 2012-06-03 DIAGNOSIS — Z5189 Encounter for other specified aftercare: Secondary | ICD-10-CM | POA: Insufficient documentation

## 2012-06-03 DIAGNOSIS — Z9889 Other specified postprocedural states: Secondary | ICD-10-CM | POA: Insufficient documentation

## 2012-06-03 DIAGNOSIS — Z8679 Personal history of other diseases of the circulatory system: Secondary | ICD-10-CM

## 2012-06-03 DIAGNOSIS — Z79899 Other long term (current) drug therapy: Secondary | ICD-10-CM | POA: Insufficient documentation

## 2012-06-03 DIAGNOSIS — I2789 Other specified pulmonary heart diseases: Secondary | ICD-10-CM | POA: Insufficient documentation

## 2012-06-03 DIAGNOSIS — G35 Multiple sclerosis: Secondary | ICD-10-CM | POA: Insufficient documentation

## 2012-06-03 DIAGNOSIS — Z952 Presence of prosthetic heart valve: Secondary | ICD-10-CM

## 2012-06-03 DIAGNOSIS — E039 Hypothyroidism, unspecified: Secondary | ICD-10-CM | POA: Insufficient documentation

## 2012-06-03 DIAGNOSIS — Z881 Allergy status to other antibiotic agents status: Secondary | ICD-10-CM | POA: Insufficient documentation

## 2012-06-03 DIAGNOSIS — I059 Rheumatic mitral valve disease, unspecified: Secondary | ICD-10-CM | POA: Insufficient documentation

## 2012-06-03 DIAGNOSIS — I440 Atrioventricular block, first degree: Secondary | ICD-10-CM | POA: Insufficient documentation

## 2012-06-03 DIAGNOSIS — F411 Generalized anxiety disorder: Secondary | ICD-10-CM | POA: Insufficient documentation

## 2012-06-06 ENCOUNTER — Encounter (HOSPITAL_COMMUNITY): Payer: Medicare Other

## 2012-06-06 ENCOUNTER — Encounter (HOSPITAL_COMMUNITY)
Admission: RE | Admit: 2012-06-06 | Discharge: 2012-06-06 | Disposition: A | Discharge status: Home / Self Care | Payer: Self-pay | Source: Ambulatory Visit | Attending: Cardiology | Admitting: Cardiology

## 2012-06-08 ENCOUNTER — Encounter (HOSPITAL_COMMUNITY): Payer: Self-pay

## 2012-06-08 ENCOUNTER — Encounter (HOSPITAL_COMMUNITY): Payer: Medicare Other

## 2012-06-10 ENCOUNTER — Encounter (HOSPITAL_COMMUNITY)
Admission: RE | Admit: 2012-06-10 | Discharge: 2012-06-10 | Disposition: A | Discharge status: Home / Self Care | Payer: Self-pay | Source: Ambulatory Visit | Attending: Cardiology | Admitting: Cardiology

## 2012-06-10 ENCOUNTER — Encounter (HOSPITAL_COMMUNITY): Payer: Medicare Other

## 2012-06-13 ENCOUNTER — Encounter (HOSPITAL_COMMUNITY)
Admission: RE | Admit: 2012-06-13 | Discharge: 2012-06-13 | Disposition: A | Discharge status: Home / Self Care | Payer: Self-pay | Source: Ambulatory Visit | Attending: Cardiology | Admitting: Cardiology

## 2012-06-13 ENCOUNTER — Ambulatory Visit (INDEPENDENT_AMBULATORY_CARE_PROVIDER_SITE_OTHER): Payer: Medicare Other | Admitting: Pharmacist

## 2012-06-13 DIAGNOSIS — Z9889 Other specified postprocedural states: Secondary | ICD-10-CM

## 2012-06-13 DIAGNOSIS — Z7901 Long term (current) use of anticoagulants: Secondary | ICD-10-CM

## 2012-06-13 DIAGNOSIS — I509 Heart failure, unspecified: Secondary | ICD-10-CM | POA: Diagnosis not present

## 2012-06-13 DIAGNOSIS — I4891 Unspecified atrial fibrillation: Secondary | ICD-10-CM | POA: Diagnosis not present

## 2012-06-13 DIAGNOSIS — Z954 Presence of other heart-valve replacement: Secondary | ICD-10-CM

## 2012-06-13 DIAGNOSIS — Z952 Presence of prosthetic heart valve: Secondary | ICD-10-CM

## 2012-06-15 ENCOUNTER — Encounter (HOSPITAL_COMMUNITY): Payer: Self-pay

## 2012-06-17 ENCOUNTER — Encounter (HOSPITAL_COMMUNITY)
Admission: RE | Admit: 2012-06-17 | Discharge: 2012-06-17 | Disposition: A | Discharge status: Home / Self Care | Payer: Self-pay | Source: Ambulatory Visit | Attending: Cardiology | Admitting: Cardiology

## 2012-06-22 ENCOUNTER — Encounter (HOSPITAL_COMMUNITY)
Admission: RE | Admit: 2012-06-22 | Discharge: 2012-06-22 | Disposition: A | Discharge status: Home / Self Care | Payer: Self-pay | Source: Ambulatory Visit | Attending: Cardiology | Admitting: Cardiology

## 2012-06-22 DIAGNOSIS — G35 Multiple sclerosis: Secondary | ICD-10-CM | POA: Insufficient documentation

## 2012-06-22 DIAGNOSIS — E039 Hypothyroidism, unspecified: Secondary | ICD-10-CM | POA: Insufficient documentation

## 2012-06-22 DIAGNOSIS — Z881 Allergy status to other antibiotic agents status: Secondary | ICD-10-CM | POA: Insufficient documentation

## 2012-06-22 DIAGNOSIS — Z5189 Encounter for other specified aftercare: Secondary | ICD-10-CM | POA: Insufficient documentation

## 2012-06-22 DIAGNOSIS — I4891 Unspecified atrial fibrillation: Secondary | ICD-10-CM | POA: Insufficient documentation

## 2012-06-22 DIAGNOSIS — Z9889 Other specified postprocedural states: Secondary | ICD-10-CM | POA: Insufficient documentation

## 2012-06-22 DIAGNOSIS — R000 Tachycardia, unspecified: Secondary | ICD-10-CM | POA: Insufficient documentation

## 2012-06-22 DIAGNOSIS — M899 Disorder of bone, unspecified: Secondary | ICD-10-CM | POA: Insufficient documentation

## 2012-06-22 DIAGNOSIS — Z79899 Other long term (current) drug therapy: Secondary | ICD-10-CM | POA: Insufficient documentation

## 2012-06-22 DIAGNOSIS — I2789 Other specified pulmonary heart diseases: Secondary | ICD-10-CM | POA: Insufficient documentation

## 2012-06-22 DIAGNOSIS — F411 Generalized anxiety disorder: Secondary | ICD-10-CM | POA: Insufficient documentation

## 2012-06-22 DIAGNOSIS — Z853 Personal history of malignant neoplasm of breast: Secondary | ICD-10-CM | POA: Insufficient documentation

## 2012-06-22 DIAGNOSIS — I509 Heart failure, unspecified: Secondary | ICD-10-CM | POA: Insufficient documentation

## 2012-06-22 DIAGNOSIS — I440 Atrioventricular block, first degree: Secondary | ICD-10-CM | POA: Insufficient documentation

## 2012-06-22 DIAGNOSIS — Z7901 Long term (current) use of anticoagulants: Secondary | ICD-10-CM | POA: Insufficient documentation

## 2012-06-22 DIAGNOSIS — I059 Rheumatic mitral valve disease, unspecified: Secondary | ICD-10-CM | POA: Insufficient documentation

## 2012-06-24 ENCOUNTER — Encounter (HOSPITAL_COMMUNITY)
Admission: RE | Admit: 2012-06-24 | Discharge: 2012-06-24 | Disposition: A | Discharge status: Home / Self Care | Payer: Self-pay | Source: Ambulatory Visit | Attending: Cardiology | Admitting: Cardiology

## 2012-06-24 ENCOUNTER — Ambulatory Visit (INDEPENDENT_AMBULATORY_CARE_PROVIDER_SITE_OTHER): Payer: Medicare Other | Admitting: Pharmacist

## 2012-06-24 DIAGNOSIS — I509 Heart failure, unspecified: Secondary | ICD-10-CM | POA: Diagnosis not present

## 2012-06-24 DIAGNOSIS — Z8679 Personal history of other diseases of the circulatory system: Secondary | ICD-10-CM

## 2012-06-24 DIAGNOSIS — Z952 Presence of prosthetic heart valve: Secondary | ICD-10-CM

## 2012-06-24 DIAGNOSIS — Z954 Presence of other heart-valve replacement: Secondary | ICD-10-CM

## 2012-06-24 DIAGNOSIS — I4891 Unspecified atrial fibrillation: Secondary | ICD-10-CM

## 2012-06-24 DIAGNOSIS — Z7901 Long term (current) use of anticoagulants: Secondary | ICD-10-CM

## 2012-06-24 DIAGNOSIS — Z9889 Other specified postprocedural states: Secondary | ICD-10-CM

## 2012-06-24 MED ORDER — WARFARIN SODIUM 5 MG PO TABS: ORAL_TABLET | ORAL | Status: DC

## 2012-06-27 ENCOUNTER — Encounter (HOSPITAL_COMMUNITY)
Admission: RE | Admit: 2012-06-27 | Discharge: 2012-06-27 | Disposition: A | Discharge status: Home / Self Care | Payer: Self-pay | Source: Ambulatory Visit | Attending: Cardiology | Admitting: Cardiology

## 2012-06-29 ENCOUNTER — Encounter (HOSPITAL_COMMUNITY)
Admission: RE | Admit: 2012-06-29 | Discharge: 2012-06-29 | Disposition: A | Discharge status: Home / Self Care | Payer: Self-pay | Source: Ambulatory Visit | Attending: Cardiology | Admitting: Cardiology

## 2012-06-30 ENCOUNTER — Ambulatory Visit (INDEPENDENT_AMBULATORY_CARE_PROVIDER_SITE_OTHER): Payer: Medicare Other | Admitting: *Deleted

## 2012-06-30 ENCOUNTER — Ambulatory Visit (INDEPENDENT_AMBULATORY_CARE_PROVIDER_SITE_OTHER): Payer: Medicare Other | Admitting: Cardiology

## 2012-06-30 ENCOUNTER — Encounter: Payer: Self-pay | Admitting: Cardiology

## 2012-06-30 VITALS — BP 110/62 | HR 84 | Ht 69.0 in | Wt 148.0 lb

## 2012-06-30 DIAGNOSIS — I509 Heart failure, unspecified: Secondary | ICD-10-CM

## 2012-06-30 DIAGNOSIS — Z954 Presence of other heart-valve replacement: Secondary | ICD-10-CM

## 2012-06-30 DIAGNOSIS — I4891 Unspecified atrial fibrillation: Secondary | ICD-10-CM | POA: Diagnosis not present

## 2012-06-30 DIAGNOSIS — R000 Tachycardia, unspecified: Secondary | ICD-10-CM

## 2012-06-30 DIAGNOSIS — Z952 Presence of prosthetic heart valve: Secondary | ICD-10-CM

## 2012-06-30 DIAGNOSIS — Z9889 Other specified postprocedural states: Secondary | ICD-10-CM

## 2012-06-30 DIAGNOSIS — Z7901 Long term (current) use of anticoagulants: Secondary | ICD-10-CM | POA: Diagnosis not present

## 2012-06-30 LAB — POCT INR: INR: 1.8

## 2012-06-30 NOTE — Assessment & Plan Note (Signed)
She has sinus rhythm by physical exam at this time. No change in therapy. She'll continue on Coumadin. I'll see her in 3 months.

## 2012-06-30 NOTE — Assessment & Plan Note (Signed)
Heart rate today is stable. No change in therapy.

## 2012-06-30 NOTE — Patient Instructions (Addendum)
Your physician recommends that you schedule a follow-up appointment in: 3 months.  

## 2012-06-30 NOTE — Assessment & Plan Note (Signed)
Volume status is stable. No change in therapy. She is off her diuretic.

## 2012-06-30 NOTE — Progress Notes (Signed)
HPI Patient is seen for followup her mitral valve replacement and atrial fibrillation. Her amiodarone had been stopped prior to July visit. She is maintaining sinus rhythm. She has not regained any significant fluid overload. She is increasing her activity level and she looks great. Allergies  Allergen Reactions  . Erythromycin Nausea And Vomiting    Current Outpatient Prescriptions  Medication Sig Dispense Refill  . Black Cohosh 40 MG CAPS Take 1 capsule by mouth daily.      . Bromelains (BROMELAIN PO) Take 1 capsule by mouth 2 (two) times daily.      . Cholecalciferol (VITAMIN D) 2000 UNITS CAPS Take 6,000 Units by mouth daily.      . Coenzyme Q10 (CO Q-10) 100 MG CAPS Take 1 capsule by mouth 2 (two) times daily.       . Cranberry 500 MG CAPS Take 1 capsule by mouth daily.      Marland Kitchen L-THEANINE PO Take 1 capsule by mouth daily.       Marland Kitchen lactose free nutrition (BOOST PLUS) LIQD Take 237 mLs by mouth 2 (two) times daily between meals.      . Lysine 500 MG CAPS Take 1 capsule by mouth daily.      . Magnesium 200 MG TABS Take 400 mg by mouth 2 (two) times daily.      . metoprolol tartrate (LOPRESSOR) 50 MG tablet Take 0.5 tablets (25 mg total) by mouth 2 (two) times daily.  30 tablet  11  . MILK THISTLE PO Take 1 capsule by mouth 2 (two) times daily.      . Omega-3 Fatty Acids (FISH OIL PO) Take 2 capsules by mouth 2 (two) times daily.      Marland Kitchen OVER THE COUNTER MEDICATION Red marine algae - 2 capsules daily      . PROGESTERONE MICRONIZED PO Take 25 mg by mouth 2 (two) times daily.      Marland Kitchen thyroid (ARMOUR) 60 MG tablet Take 60 mg by mouth daily.      . vitamin A 40981 UNIT capsule Take 10,000 Units by mouth daily.      . vitamin E 400 UNIT capsule Take 400 Units by mouth daily.      Marland Kitchen warfarin (COUMADIN) 5 MG tablet Take as directed by Coumadin Clinic/  35 tablet  2    History   Social History  . Marital Status: Widowed    Spouse Name: N/A    Number of Children: N/A  . Years of Education:  N/A   Occupational History  . Not on file.   Social History Main Topics  . Smoking status: Never Smoker   . Smokeless tobacco: Never Used  . Alcohol Use: No  . Drug Use: No  . Sexually Active: Not on file   Other Topics Concern  . Not on file   Social History Narrative  . No narrative on file    No family history on file.  Past Medical History  Diagnosis Date  . Multiple sclerosis   . Breast cancer   . Ovarian cyst   . Hypothyroidism   . Mitral valve regurgitation     Mitral valve replacement April, 2013, Mitral valve prolapse  . CHF (congestive heart failure)     Related to severe mitral regurgitation, April, 2013  . Shortness of breath     Related to severe mitral regurgitation, April, 2013  . Paroxysmal atrial fibrillation     Rapid atrial fibrillation in-hospital, Rapid cardioversion,  before  mitral valve surgery  . Neuromuscular disorder     ms  . Arthritis     knees  . 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 S/P Maze operation for atrial fibrillation 01/26/2012    Complete biatrial lesion set using cryothermy via right mini thoracotomy  . Pulmonary hypertension     Echo, April, 2013, before mitral valve surgery  . 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 Assess by pulmonary team in the hospital April, 2013  . Anxiety   . Neurogenic bladder   . Ejection fraction     EF 60%, echo, April, 2013, with severe MR before mitral valve replacement  . Warfarin anticoagulation     Mechanical mitral prosthesis, April, [address]    Past Surgical History  Procedure Date  . Breast lumpectomy   . Lymphadenectomy   . Wrist surgery   . Hernia repair   . Tonsillectomy   . Cystoscopy   . Laparoscopy   . Tee without cardioversion 01/19/2012    Procedure: TRANSESOPHAGEAL ECHOCARDIOGRAM (TEE);  Surgeon: Peter M Swaziland, MD;  Location: [facility] ENDOSCOPY;  Service:  Cardiovascular;  Laterality: N/A;  . Maze 01/26/2012    Procedure: MAZE;  Surgeon: Purcell Nails, MD;  Location: [facility] OR;  Service: Open Heart Surgery;  Laterality: N/A;  . Mitral valve replacement 01/26/2012    Procedure: MINIMALLY INVASIVE MITRAL VALVE (MV) REPLACEMENT;  Surgeon: Purcell Nails, MD;  Location: [facility] OR;  Service: Open Heart Surgery;  Laterality: Right;  . Chest tube insertion 01/26/2012    Procedure: CHEST TUBE INSERTION;  Surgeon: Purcell Nails, MD;  Location: [facility] OR;  Service: Open Heart Surgery;  Laterality: Left;    ROS   Patient denies fever, chills, headache, sweats, rash, change in vision, change in hearing, chest pain, cough, nausea vomiting, urinary symptoms. All other systems are reviewed and are negative.  PHYSICAL EXAM  Patient is oriented to person time and place. Affect is normal. Lungs are clear. Respiratory effort is nonlabored. Cardiac exam reveals a S1. Her closure sound of the mitral prosthesis is crisp. The abdomen is soft. There is no significant edema.  Filed Vitals:   06/30/12 1427  BP: 110/62  Pulse: 84  Height: 5\' 9"  (1.753 m)  Weight: 148 lb (67.132 kg)    EKG  ASSESSMENT & PLAN

## 2012-06-30 NOTE — Assessment & Plan Note (Signed)
She is very stable post mitral valve replacement. No change in therapy.

## 2012-07-01 ENCOUNTER — Encounter (HOSPITAL_COMMUNITY): Payer: Self-pay

## 2012-07-04 ENCOUNTER — Encounter (HOSPITAL_COMMUNITY)
Admission: RE | Admit: 2012-07-04 | Discharge: 2012-07-04 | Disposition: A | Discharge status: Home / Self Care | Payer: Self-pay | Source: Ambulatory Visit | Attending: Cardiology | Admitting: Cardiology

## 2012-07-05 ENCOUNTER — Telehealth: Payer: Self-pay | Admitting: Pharmacist

## 2012-07-05 NOTE — Telephone Encounter (Signed)
Pt called to report her stools looked a little darker over the past few days.  She had her INR last checked on 9/12 and it was subtherapeutic.  She does not take iron supplements and doesn't report eating any thing different in her diet.  She did say the color was closer to normal today.  Last H/H was normal.  Asked patient to watch her stools over the next 24 hours and if there is no change, will have her come and get a stool card to make sure there is no blood.  She is agreeable.

## 2012-07-06 ENCOUNTER — Encounter (HOSPITAL_COMMUNITY): Payer: Self-pay

## 2012-07-08 ENCOUNTER — Encounter (HOSPITAL_COMMUNITY)
Admission: RE | Admit: 2012-07-08 | Discharge: 2012-07-08 | Disposition: A | Discharge status: Home / Self Care | Payer: Self-pay | Source: Ambulatory Visit | Attending: Cardiology | Admitting: Cardiology

## 2012-07-11 ENCOUNTER — Encounter (HOSPITAL_COMMUNITY): Payer: Self-pay

## 2012-07-11 ENCOUNTER — Other Ambulatory Visit: Payer: Self-pay

## 2012-07-11 ENCOUNTER — Ambulatory Visit (INDEPENDENT_AMBULATORY_CARE_PROVIDER_SITE_OTHER): Payer: Medicare Other | Admitting: *Deleted

## 2012-07-11 DIAGNOSIS — Z7901 Long term (current) use of anticoagulants: Secondary | ICD-10-CM | POA: Diagnosis not present

## 2012-07-11 DIAGNOSIS — Z8679 Personal history of other diseases of the circulatory system: Secondary | ICD-10-CM

## 2012-07-11 DIAGNOSIS — Z954 Presence of other heart-valve replacement: Secondary | ICD-10-CM

## 2012-07-11 DIAGNOSIS — Z9889 Other specified postprocedural states: Secondary | ICD-10-CM | POA: Diagnosis not present

## 2012-07-11 DIAGNOSIS — Z952 Presence of prosthetic heart valve: Secondary | ICD-10-CM

## 2012-07-11 DIAGNOSIS — I4891 Unspecified atrial fibrillation: Secondary | ICD-10-CM

## 2012-07-11 DIAGNOSIS — I509 Heart failure, unspecified: Secondary | ICD-10-CM

## 2012-07-11 LAB — POCT INR: INR: 2.5

## 2012-07-11 MED ORDER — METOPROLOL TARTRATE 25 MG PO TABS: 25.0000 mg | ORAL_TABLET | Freq: Two times a day (BID) | ORAL | Status: DC

## 2012-07-12 DIAGNOSIS — G35 Multiple sclerosis: Secondary | ICD-10-CM | POA: Diagnosis not present

## 2012-07-13 ENCOUNTER — Encounter (HOSPITAL_COMMUNITY)
Admission: RE | Admit: 2012-07-13 | Discharge: 2012-07-13 | Disposition: A | Discharge status: Home / Self Care | Payer: Self-pay | Source: Ambulatory Visit | Attending: Cardiology | Admitting: Cardiology

## 2012-07-15 ENCOUNTER — Encounter (HOSPITAL_COMMUNITY)
Admission: RE | Admit: 2012-07-15 | Discharge: 2012-07-15 | Disposition: A | Discharge status: Home / Self Care | Payer: Self-pay | Source: Ambulatory Visit | Attending: Cardiology | Admitting: Cardiology

## 2012-07-18 ENCOUNTER — Encounter (HOSPITAL_COMMUNITY)
Admission: RE | Admit: 2012-07-18 | Discharge: 2012-07-18 | Disposition: A | Discharge status: Home / Self Care | Payer: Self-pay | Source: Ambulatory Visit | Attending: Cardiology | Admitting: Cardiology

## 2012-07-20 ENCOUNTER — Encounter (HOSPITAL_COMMUNITY): Payer: Self-pay

## 2012-07-20 DIAGNOSIS — Z7901 Long term (current) use of anticoagulants: Secondary | ICD-10-CM | POA: Insufficient documentation

## 2012-07-20 DIAGNOSIS — Z79899 Other long term (current) drug therapy: Secondary | ICD-10-CM | POA: Insufficient documentation

## 2012-07-20 DIAGNOSIS — M949 Disorder of cartilage, unspecified: Secondary | ICD-10-CM | POA: Insufficient documentation

## 2012-07-20 DIAGNOSIS — I440 Atrioventricular block, first degree: Secondary | ICD-10-CM | POA: Insufficient documentation

## 2012-07-20 DIAGNOSIS — Z9889 Other specified postprocedural states: Secondary | ICD-10-CM | POA: Insufficient documentation

## 2012-07-20 DIAGNOSIS — I4891 Unspecified atrial fibrillation: Secondary | ICD-10-CM | POA: Insufficient documentation

## 2012-07-20 DIAGNOSIS — G35 Multiple sclerosis: Secondary | ICD-10-CM | POA: Insufficient documentation

## 2012-07-20 DIAGNOSIS — I509 Heart failure, unspecified: Secondary | ICD-10-CM | POA: Insufficient documentation

## 2012-07-20 DIAGNOSIS — F411 Generalized anxiety disorder: Secondary | ICD-10-CM | POA: Insufficient documentation

## 2012-07-20 DIAGNOSIS — I2789 Other specified pulmonary heart diseases: Secondary | ICD-10-CM | POA: Insufficient documentation

## 2012-07-20 DIAGNOSIS — I059 Rheumatic mitral valve disease, unspecified: Secondary | ICD-10-CM | POA: Insufficient documentation

## 2012-07-20 DIAGNOSIS — Z881 Allergy status to other antibiotic agents status: Secondary | ICD-10-CM | POA: Insufficient documentation

## 2012-07-20 DIAGNOSIS — E039 Hypothyroidism, unspecified: Secondary | ICD-10-CM | POA: Insufficient documentation

## 2012-07-20 DIAGNOSIS — Z5189 Encounter for other specified aftercare: Secondary | ICD-10-CM | POA: Insufficient documentation

## 2012-07-20 DIAGNOSIS — R000 Tachycardia, unspecified: Secondary | ICD-10-CM | POA: Insufficient documentation

## 2012-07-20 DIAGNOSIS — M899 Disorder of bone, unspecified: Secondary | ICD-10-CM | POA: Insufficient documentation

## 2012-07-20 DIAGNOSIS — Z853 Personal history of malignant neoplasm of breast: Secondary | ICD-10-CM | POA: Insufficient documentation

## 2012-07-22 ENCOUNTER — Encounter (HOSPITAL_COMMUNITY)
Admission: RE | Admit: 2012-07-22 | Discharge: 2012-07-22 | Disposition: A | Discharge status: Home / Self Care | Payer: Self-pay | Source: Ambulatory Visit | Attending: Cardiology | Admitting: Cardiology

## 2012-07-25 ENCOUNTER — Encounter (HOSPITAL_COMMUNITY): Payer: Self-pay

## 2012-07-27 ENCOUNTER — Encounter (HOSPITAL_COMMUNITY)
Admission: RE | Admit: 2012-07-27 | Discharge: 2012-07-27 | Disposition: A | Discharge status: Home / Self Care | Payer: Self-pay | Source: Ambulatory Visit | Attending: Cardiology | Admitting: Cardiology

## 2012-07-29 ENCOUNTER — Encounter (HOSPITAL_COMMUNITY)
Admission: RE | Admit: 2012-07-29 | Discharge: 2012-07-29 | Disposition: A | Discharge status: Home / Self Care | Payer: Self-pay | Source: Ambulatory Visit | Attending: Cardiology | Admitting: Cardiology

## 2012-08-01 ENCOUNTER — Encounter (HOSPITAL_COMMUNITY)
Admission: RE | Admit: 2012-08-01 | Discharge: 2012-08-01 | Disposition: A | Discharge status: Home / Self Care | Payer: Self-pay | Source: Ambulatory Visit | Attending: Cardiology | Admitting: Cardiology

## 2012-08-01 ENCOUNTER — Ambulatory Visit (INDEPENDENT_AMBULATORY_CARE_PROVIDER_SITE_OTHER): Payer: Medicare Other | Admitting: *Deleted

## 2012-08-01 DIAGNOSIS — I4891 Unspecified atrial fibrillation: Secondary | ICD-10-CM

## 2012-08-01 DIAGNOSIS — I509 Heart failure, unspecified: Secondary | ICD-10-CM

## 2012-08-01 DIAGNOSIS — Z9889 Other specified postprocedural states: Secondary | ICD-10-CM

## 2012-08-01 DIAGNOSIS — Z954 Presence of other heart-valve replacement: Secondary | ICD-10-CM

## 2012-08-01 DIAGNOSIS — Z952 Presence of prosthetic heart valve: Secondary | ICD-10-CM

## 2012-08-01 DIAGNOSIS — Z7901 Long term (current) use of anticoagulants: Secondary | ICD-10-CM | POA: Diagnosis not present

## 2012-08-03 ENCOUNTER — Encounter (HOSPITAL_COMMUNITY): Payer: Self-pay

## 2012-08-04 DIAGNOSIS — N951 Menopausal and female climacteric states: Secondary | ICD-10-CM | POA: Diagnosis not present

## 2012-08-05 ENCOUNTER — Encounter (HOSPITAL_COMMUNITY): Payer: Self-pay

## 2012-08-08 ENCOUNTER — Encounter: Payer: Self-pay | Admitting: Thoracic Surgery (Cardiothoracic Vascular Surgery)

## 2012-08-08 ENCOUNTER — Encounter (HOSPITAL_COMMUNITY): Admission: RE | Admit: 2012-08-08 | Payer: Self-pay | Source: Ambulatory Visit

## 2012-08-08 ENCOUNTER — Ambulatory Visit
Admission: RE | Admit: 2012-08-08 | Discharge: 2012-08-08 | Disposition: A | Discharge status: Home / Self Care | Payer: Medicare Other | Source: Ambulatory Visit | Attending: Thoracic Surgery (Cardiothoracic Vascular Surgery) | Admitting: Thoracic Surgery (Cardiothoracic Vascular Surgery)

## 2012-08-08 ENCOUNTER — Other Ambulatory Visit: Payer: Self-pay | Admitting: Thoracic Surgery (Cardiothoracic Vascular Surgery)

## 2012-08-08 ENCOUNTER — Ambulatory Visit (INDEPENDENT_AMBULATORY_CARE_PROVIDER_SITE_OTHER): Payer: Medicare Other | Admitting: Thoracic Surgery (Cardiothoracic Vascular Surgery)

## 2012-08-08 VITALS — BP 107/74 | HR 82 | Resp 18 | Ht 69.0 in | Wt 150.0 lb

## 2012-08-08 DIAGNOSIS — Z952 Presence of prosthetic heart valve: Secondary | ICD-10-CM

## 2012-08-08 DIAGNOSIS — J9819 Other pulmonary collapse: Secondary | ICD-10-CM | POA: Diagnosis not present

## 2012-08-08 DIAGNOSIS — J90 Pleural effusion, not elsewhere classified: Secondary | ICD-10-CM | POA: Diagnosis not present

## 2012-08-08 DIAGNOSIS — I2510 Atherosclerotic heart disease of native coronary artery without angina pectoris: Secondary | ICD-10-CM

## 2012-08-08 DIAGNOSIS — Z9889 Other specified postprocedural states: Secondary | ICD-10-CM | POA: Diagnosis not present

## 2012-08-08 DIAGNOSIS — Z954 Presence of other heart-valve replacement: Secondary | ICD-10-CM | POA: Diagnosis not present

## 2012-08-08 DIAGNOSIS — Z8679 Personal history of other diseases of the circulatory system: Secondary | ICD-10-CM

## 2012-08-08 NOTE — Progress Notes (Signed)
[address]            [place] [address]          [phone]     CARDIOTHORACIC SURGERY OFFICE NOTE  Referring Provider is Luis Abed, MD PCP is Henri Medal, MD   HPI:  Patient returns for followup 6 months status post minimally invasive mitral valve replacement and Maze procedure. She was last seen here in the office 3 months ago. Since then she has done extremely well. She finished the cardiac rehabilitation program but has continued on her long-term maintenance program. She enjoys very much and has enjoyed a return to completely normal physical activity. She has not had any problems with Coumadin therapy. She denies any shortness of breath. She has not had any tachypalpitations nor other documented arrhythmias. Overall she is delighted with her progress.   Current Outpatient Prescriptions  Medication Sig Dispense Refill  . Black Cohosh 40 MG CAPS Take 1 capsule by mouth daily.      . Bromelains (BROMELAIN PO) Take 1 capsule by mouth 2 (two) times daily.      . Cholecalciferol (VITAMIN D) 2000 UNITS CAPS Take 6,000 Units by mouth daily.      . Coenzyme Q10 (CO Q-10) 100 MG CAPS Take 1 capsule by mouth 2 (two) times daily.       . Cranberry 500 MG CAPS Take 1 capsule by mouth daily.      Marland Kitchen L-THEANINE PO Take 1 capsule by mouth daily.       Marland Kitchen lactose free nutrition (BOOST PLUS) LIQD Take 237 mLs by mouth 2 (two) times daily between meals.      . Lysine 500 MG CAPS Take 1 capsule by mouth daily.      . Magnesium 200 MG TABS Take 400 mg by mouth 2 (two) times daily.      . metoprolol (LOPRESSOR) 25 MG tablet Take 1 tablet (25 mg total) by mouth 2 (two) times daily.  60 tablet  11  . MILK THISTLE PO Take 1 capsule by mouth 2 (two) times daily.      Gracelyn Nurse Leaf 500 MG CAPS Take by mouth daily.      Marland Kitchen OVER THE COUNTER MEDICATION Red marine algae - 2 capsules daily      . PROGESTERONE MICRONIZED PO Take 25 mg by mouth 2 (two) times daily.       Marland Kitchen thyroid (ARMOUR) 60 MG tablet Take 60 mg by mouth daily.      . vitamin A 91478 UNIT capsule Take 10,000 Units by mouth daily.      . vitamin E 400 UNIT capsule Take 400 Units by mouth daily.      Marland Kitchen warfarin (COUMADIN) 5 MG tablet Take as directed by Coumadin Clinic/  35 tablet  2      Physical Exam:   BP 107/74  Pulse 82  Resp 18  Ht 5\' 9"  (1.753 m)  Wt 150 lb (68.04 kg)  BMI 22.15 kg/m2  SpO2 96%  General:  Well-appearing  Chest:   Clear to auscultation with symmetrical breath sounds  CV:   Regular rate and rhythm with mechanical heart sounds and no murmur  Incisions:  Completely healed  Abdomen:  Soft nontender  Extremities:  Warm and well-perfused with no lower extremity edema  Diagnostic Tests:  2 channel telemetry rhythm strip demonstrates normal sinus  rhythm  *RADIOLOGY REPORT*  Clinical Data: History of mitral valve replacement, follow-up  CHEST - 2 VIEW  Comparison: Chest x-ray of 02/19/2012  Findings: Aeration of the lungs has improved. The small effusions  and basilar atelectasis noted previously have resolved. Mitral  valve replacement is noted. There is pleural and parenchymal  scarring in the right lung apex which appears stable and there may  be slight volume loss of the right upper lobe. Heart size is  stable. No bony abnormality is seen.  IMPRESSION:  Improved aeration with resolution of basilar atelectasis and small  effusions.  Original Report Authenticated By: Juline Patch, M.D.     Impression:  Patient is doing very well more than 6 months status post minimally invasive mitral valve replacement and Maze procedure. She is maintaining sinus rhythm off amiodarone. Clinically she is doing very well.  Plan:  We will plan to see the patient back in 6 months for routine followup and rhythm check.   Salvatore Decent. Cornelius Moras, MD 08/08/2012 12:21 PM

## 2012-08-10 ENCOUNTER — Encounter (HOSPITAL_COMMUNITY)
Admission: RE | Admit: 2012-08-10 | Discharge: 2012-08-10 | Disposition: A | Discharge status: Home / Self Care | Payer: Self-pay | Source: Ambulatory Visit | Attending: Cardiology | Admitting: Cardiology

## 2012-08-10 DIAGNOSIS — G35 Multiple sclerosis: Secondary | ICD-10-CM | POA: Diagnosis not present

## 2012-08-12 ENCOUNTER — Encounter (HOSPITAL_COMMUNITY)
Admission: RE | Admit: 2012-08-12 | Discharge: 2012-08-12 | Disposition: A | Discharge status: Home / Self Care | Payer: Self-pay | Source: Ambulatory Visit | Attending: Cardiology | Admitting: Cardiology

## 2012-08-15 ENCOUNTER — Encounter (HOSPITAL_COMMUNITY)
Admission: RE | Admit: 2012-08-15 | Discharge: 2012-08-15 | Disposition: A | Discharge status: Home / Self Care | Payer: Self-pay | Source: Ambulatory Visit | Attending: Cardiology | Admitting: Cardiology

## 2012-08-15 ENCOUNTER — Ambulatory Visit (INDEPENDENT_AMBULATORY_CARE_PROVIDER_SITE_OTHER): Payer: Medicare Other | Admitting: *Deleted

## 2012-08-15 DIAGNOSIS — Z9889 Other specified postprocedural states: Secondary | ICD-10-CM

## 2012-08-15 DIAGNOSIS — Z954 Presence of other heart-valve replacement: Secondary | ICD-10-CM | POA: Diagnosis not present

## 2012-08-15 DIAGNOSIS — I509 Heart failure, unspecified: Secondary | ICD-10-CM | POA: Diagnosis not present

## 2012-08-15 DIAGNOSIS — Z7901 Long term (current) use of anticoagulants: Secondary | ICD-10-CM | POA: Diagnosis not present

## 2012-08-15 DIAGNOSIS — I4891 Unspecified atrial fibrillation: Secondary | ICD-10-CM

## 2012-08-15 DIAGNOSIS — Z952 Presence of prosthetic heart valve: Secondary | ICD-10-CM

## 2012-08-15 MED ORDER — WARFARIN SODIUM 5 MG PO TABS: ORAL_TABLET | ORAL | Status: DC

## 2012-08-17 ENCOUNTER — Encounter (HOSPITAL_COMMUNITY): Payer: Self-pay

## 2012-08-19 ENCOUNTER — Encounter (HOSPITAL_COMMUNITY)
Admission: RE | Admit: 2012-08-19 | Discharge: 2012-08-19 | Disposition: A | Discharge status: Home / Self Care | Payer: Self-pay | Source: Ambulatory Visit | Attending: Cardiology | Admitting: Cardiology

## 2012-08-19 DIAGNOSIS — I2789 Other specified pulmonary heart diseases: Secondary | ICD-10-CM | POA: Insufficient documentation

## 2012-08-19 DIAGNOSIS — Z7901 Long term (current) use of anticoagulants: Secondary | ICD-10-CM | POA: Insufficient documentation

## 2012-08-19 DIAGNOSIS — I440 Atrioventricular block, first degree: Secondary | ICD-10-CM | POA: Insufficient documentation

## 2012-08-19 DIAGNOSIS — M899 Disorder of bone, unspecified: Secondary | ICD-10-CM | POA: Insufficient documentation

## 2012-08-19 DIAGNOSIS — Z9889 Other specified postprocedural states: Secondary | ICD-10-CM | POA: Insufficient documentation

## 2012-08-19 DIAGNOSIS — I059 Rheumatic mitral valve disease, unspecified: Secondary | ICD-10-CM | POA: Insufficient documentation

## 2012-08-19 DIAGNOSIS — Z79899 Other long term (current) drug therapy: Secondary | ICD-10-CM | POA: Insufficient documentation

## 2012-08-19 DIAGNOSIS — R000 Tachycardia, unspecified: Secondary | ICD-10-CM | POA: Insufficient documentation

## 2012-08-19 DIAGNOSIS — Z853 Personal history of malignant neoplasm of breast: Secondary | ICD-10-CM | POA: Insufficient documentation

## 2012-08-19 DIAGNOSIS — Z5189 Encounter for other specified aftercare: Secondary | ICD-10-CM | POA: Insufficient documentation

## 2012-08-19 DIAGNOSIS — Z881 Allergy status to other antibiotic agents status: Secondary | ICD-10-CM | POA: Insufficient documentation

## 2012-08-19 DIAGNOSIS — I509 Heart failure, unspecified: Secondary | ICD-10-CM | POA: Insufficient documentation

## 2012-08-19 DIAGNOSIS — F411 Generalized anxiety disorder: Secondary | ICD-10-CM | POA: Insufficient documentation

## 2012-08-19 DIAGNOSIS — G35 Multiple sclerosis: Secondary | ICD-10-CM | POA: Insufficient documentation

## 2012-08-19 DIAGNOSIS — E039 Hypothyroidism, unspecified: Secondary | ICD-10-CM | POA: Insufficient documentation

## 2012-08-19 DIAGNOSIS — I4891 Unspecified atrial fibrillation: Secondary | ICD-10-CM | POA: Insufficient documentation

## 2012-08-22 ENCOUNTER — Encounter (HOSPITAL_COMMUNITY)
Admission: RE | Admit: 2012-08-22 | Discharge: 2012-08-22 | Disposition: A | Discharge status: Home / Self Care | Payer: Self-pay | Source: Ambulatory Visit | Attending: Cardiology | Admitting: Cardiology

## 2012-08-24 ENCOUNTER — Encounter (HOSPITAL_COMMUNITY): Payer: Self-pay

## 2012-08-26 ENCOUNTER — Encounter (HOSPITAL_COMMUNITY)
Admission: RE | Admit: 2012-08-26 | Discharge: 2012-08-26 | Disposition: A | Discharge status: Home / Self Care | Payer: Self-pay | Source: Ambulatory Visit | Attending: Cardiology | Admitting: Cardiology

## 2012-08-29 ENCOUNTER — Encounter (HOSPITAL_COMMUNITY): Payer: Self-pay

## 2012-08-29 ENCOUNTER — Ambulatory Visit (INDEPENDENT_AMBULATORY_CARE_PROVIDER_SITE_OTHER): Payer: Medicare Other | Admitting: Pharmacist

## 2012-08-29 DIAGNOSIS — I4891 Unspecified atrial fibrillation: Secondary | ICD-10-CM | POA: Diagnosis not present

## 2012-08-29 DIAGNOSIS — Z7901 Long term (current) use of anticoagulants: Secondary | ICD-10-CM | POA: Diagnosis not present

## 2012-08-29 DIAGNOSIS — I509 Heart failure, unspecified: Secondary | ICD-10-CM

## 2012-08-29 DIAGNOSIS — Z952 Presence of prosthetic heart valve: Secondary | ICD-10-CM

## 2012-08-29 DIAGNOSIS — Z954 Presence of other heart-valve replacement: Secondary | ICD-10-CM

## 2012-08-29 DIAGNOSIS — Z8679 Personal history of other diseases of the circulatory system: Secondary | ICD-10-CM

## 2012-08-29 DIAGNOSIS — Z9889 Other specified postprocedural states: Secondary | ICD-10-CM

## 2012-08-31 ENCOUNTER — Encounter (HOSPITAL_COMMUNITY)
Admission: RE | Admit: 2012-08-31 | Discharge: 2012-08-31 | Disposition: A | Discharge status: Home / Self Care | Payer: Self-pay | Source: Ambulatory Visit | Attending: Cardiology | Admitting: Cardiology

## 2012-09-02 ENCOUNTER — Encounter (HOSPITAL_COMMUNITY)
Admission: RE | Admit: 2012-09-02 | Discharge: 2012-09-02 | Disposition: A | Discharge status: Home / Self Care | Payer: Self-pay | Source: Ambulatory Visit | Attending: Cardiology | Admitting: Cardiology

## 2012-09-05 ENCOUNTER — Encounter (HOSPITAL_COMMUNITY): Payer: Self-pay

## 2012-09-07 ENCOUNTER — Encounter (HOSPITAL_COMMUNITY)
Admission: RE | Admit: 2012-09-07 | Discharge: 2012-09-07 | Disposition: A | Discharge status: Home / Self Care | Payer: Self-pay | Source: Ambulatory Visit | Attending: Cardiology | Admitting: Cardiology

## 2012-09-09 ENCOUNTER — Encounter (HOSPITAL_COMMUNITY)
Admission: RE | Admit: 2012-09-09 | Discharge: 2012-09-09 | Disposition: A | Discharge status: Home / Self Care | Payer: Self-pay | Source: Ambulatory Visit | Attending: Cardiology | Admitting: Cardiology

## 2012-09-12 ENCOUNTER — Encounter (HOSPITAL_COMMUNITY)
Admission: RE | Admit: 2012-09-12 | Discharge: 2012-09-12 | Disposition: A | Discharge status: Home / Self Care | Payer: Self-pay | Source: Ambulatory Visit | Attending: Cardiology | Admitting: Cardiology

## 2012-09-14 ENCOUNTER — Encounter (HOSPITAL_COMMUNITY): Payer: Self-pay

## 2012-09-19 ENCOUNTER — Ambulatory Visit (INDEPENDENT_AMBULATORY_CARE_PROVIDER_SITE_OTHER): Payer: Medicare Other | Admitting: *Deleted

## 2012-09-19 ENCOUNTER — Encounter (HOSPITAL_COMMUNITY)
Admission: RE | Admit: 2012-09-19 | Discharge: 2012-09-19 | Disposition: A | Discharge status: Home / Self Care | Payer: Self-pay | Source: Ambulatory Visit | Attending: Cardiology | Admitting: Cardiology

## 2012-09-19 DIAGNOSIS — R000 Tachycardia, unspecified: Secondary | ICD-10-CM | POA: Insufficient documentation

## 2012-09-19 DIAGNOSIS — Z954 Presence of other heart-valve replacement: Secondary | ICD-10-CM | POA: Diagnosis not present

## 2012-09-19 DIAGNOSIS — I2789 Other specified pulmonary heart diseases: Secondary | ICD-10-CM | POA: Insufficient documentation

## 2012-09-19 DIAGNOSIS — I440 Atrioventricular block, first degree: Secondary | ICD-10-CM | POA: Insufficient documentation

## 2012-09-19 DIAGNOSIS — G35 Multiple sclerosis: Secondary | ICD-10-CM | POA: Insufficient documentation

## 2012-09-19 DIAGNOSIS — I4891 Unspecified atrial fibrillation: Secondary | ICD-10-CM

## 2012-09-19 DIAGNOSIS — Z8679 Personal history of other diseases of the circulatory system: Secondary | ICD-10-CM

## 2012-09-19 DIAGNOSIS — Z9889 Other specified postprocedural states: Secondary | ICD-10-CM

## 2012-09-19 DIAGNOSIS — F411 Generalized anxiety disorder: Secondary | ICD-10-CM | POA: Insufficient documentation

## 2012-09-19 DIAGNOSIS — Z7901 Long term (current) use of anticoagulants: Secondary | ICD-10-CM | POA: Insufficient documentation

## 2012-09-19 DIAGNOSIS — I059 Rheumatic mitral valve disease, unspecified: Secondary | ICD-10-CM | POA: Insufficient documentation

## 2012-09-19 DIAGNOSIS — Z5189 Encounter for other specified aftercare: Secondary | ICD-10-CM | POA: Insufficient documentation

## 2012-09-19 DIAGNOSIS — Z853 Personal history of malignant neoplasm of breast: Secondary | ICD-10-CM | POA: Insufficient documentation

## 2012-09-19 DIAGNOSIS — E039 Hypothyroidism, unspecified: Secondary | ICD-10-CM | POA: Insufficient documentation

## 2012-09-19 DIAGNOSIS — M949 Disorder of cartilage, unspecified: Secondary | ICD-10-CM | POA: Insufficient documentation

## 2012-09-19 DIAGNOSIS — M899 Disorder of bone, unspecified: Secondary | ICD-10-CM | POA: Insufficient documentation

## 2012-09-19 DIAGNOSIS — I509 Heart failure, unspecified: Secondary | ICD-10-CM | POA: Insufficient documentation

## 2012-09-19 DIAGNOSIS — Z79899 Other long term (current) drug therapy: Secondary | ICD-10-CM | POA: Insufficient documentation

## 2012-09-19 DIAGNOSIS — Z952 Presence of prosthetic heart valve: Secondary | ICD-10-CM

## 2012-09-19 DIAGNOSIS — Z881 Allergy status to other antibiotic agents status: Secondary | ICD-10-CM | POA: Insufficient documentation

## 2012-09-19 LAB — POCT INR: INR: 2.5

## 2012-09-21 ENCOUNTER — Encounter (HOSPITAL_COMMUNITY): Payer: Self-pay

## 2012-09-23 ENCOUNTER — Encounter (HOSPITAL_COMMUNITY)
Admission: RE | Admit: 2012-09-23 | Discharge: 2012-09-23 | Disposition: A | Discharge status: Home / Self Care | Payer: Self-pay | Source: Ambulatory Visit | Attending: Cardiology | Admitting: Cardiology

## 2012-09-26 ENCOUNTER — Encounter (HOSPITAL_COMMUNITY)
Admission: RE | Admit: 2012-09-26 | Discharge: 2012-09-26 | Disposition: A | Discharge status: Home / Self Care | Payer: Self-pay | Source: Ambulatory Visit | Attending: Cardiology | Admitting: Cardiology

## 2012-09-28 ENCOUNTER — Encounter (HOSPITAL_COMMUNITY): Payer: Self-pay

## 2012-09-29 ENCOUNTER — Encounter: Payer: Self-pay | Admitting: Cardiology

## 2012-09-29 ENCOUNTER — Ambulatory Visit (INDEPENDENT_AMBULATORY_CARE_PROVIDER_SITE_OTHER): Payer: Medicare Other | Admitting: Cardiology

## 2012-09-29 VITALS — BP 122/70 | HR 89 | Ht 69.0 in | Wt 156.8 lb

## 2012-09-29 DIAGNOSIS — Z7901 Long term (current) use of anticoagulants: Secondary | ICD-10-CM | POA: Diagnosis not present

## 2012-09-29 DIAGNOSIS — Z952 Presence of prosthetic heart valve: Secondary | ICD-10-CM

## 2012-09-29 DIAGNOSIS — I4891 Unspecified atrial fibrillation: Secondary | ICD-10-CM

## 2012-09-29 DIAGNOSIS — Z954 Presence of other heart-valve replacement: Secondary | ICD-10-CM

## 2012-09-29 DIAGNOSIS — I509 Heart failure, unspecified: Secondary | ICD-10-CM

## 2012-09-29 DIAGNOSIS — R000 Tachycardia, unspecified: Secondary | ICD-10-CM | POA: Diagnosis not present

## 2012-09-29 NOTE — Assessment & Plan Note (Signed)
Patient is status post mitral  Valve replacement with a mechanical prosthesis with a mini thoracotomy. She's doing extremely well.

## 2012-09-29 NOTE — Assessment & Plan Note (Signed)
The rate is 85. This is good for her. No change in therapy.

## 2012-09-29 NOTE — Patient Instructions (Addendum)

## 2012-09-29 NOTE — Assessment & Plan Note (Addendum)
Patient's original CHF was related to her mitral regurgitation. Clinically this is now resolved.I do want to proceed with a 2-D echo. We do not have an echo since his surgery. I am hopeful that her left insula function has remained normal. This needs to be reassessed.

## 2012-09-29 NOTE — Assessment & Plan Note (Signed)
Warfarin as continued for her mitral prosthesis.

## 2012-09-29 NOTE — Progress Notes (Signed)
HPI  Patient is seen today to follow up mitral valve disease and atrial fibrillation. She looks great. She's doing well. She's gained some to body weight back which is appropriate for her. She is fully active.  Allergies  Allergen Reactions  . Erythromycin Nausea And Vomiting    Current Outpatient Prescriptions  Medication Sig Dispense Refill  . Black Cohosh 40 MG CAPS Take 1 capsule by mouth daily.      . Bromelains (BROMELAIN PO) Take 1 capsule by mouth 2 (two) times daily.      . Cholecalciferol (VITAMIN D) 2000 UNITS CAPS Take 6,000 Units by mouth daily.      . Coenzyme Q10 (CO Q-10) 100 MG CAPS Take 1 capsule by mouth 2 (two) times daily.       . Cranberry 500 MG CAPS Take 1 capsule by mouth daily.      Marland Kitchen L-THEANINE PO Take 1 capsule by mouth daily.       Marland Kitchen lactose free nutrition (BOOST PLUS) LIQD Take 237 mLs by mouth daily.       Marland Kitchen Lysine 500 MG CAPS Take 1 capsule by mouth daily.      . Magnesium 200 MG TABS Take 400 mg by mouth 2 (two) times daily.      . metoprolol (LOPRESSOR) 25 MG tablet Take 1 tablet (25 mg total) by mouth 2 (two) times daily.  60 tablet  11  . MILK THISTLE PO Take 1 capsule by mouth 2 (two) times daily.      Gracelyn Nurse Leaf 500 MG CAPS Take by mouth daily.      Marland Kitchen OVER THE COUNTER MEDICATION Red marine algae - 2 capsules daily      . PROGESTERONE MICRONIZED PO Take 50 mg by mouth 2 (two) times daily.       Marland Kitchen thyroid (ARMOUR) 60 MG tablet Take 60 mg by mouth daily.      . vitamin A 40981 UNIT capsule Take 10,000 Units by mouth daily.      . vitamin E 400 UNIT capsule Take 400 Units by mouth daily.      Marland Kitchen warfarin (COUMADIN) 5 MG tablet Take as directed by [facility]  40 tablet  3    History   Social History  . Marital Status: Widowed    Spouse Name: N/A    Number of Children: N/A  . Years of Education: N/A   Occupational History  . Not on file.   Social History Main Topics  . Smoking status: Never Smoker   . Smokeless tobacco: Never Used    . Alcohol Use: No  . Drug Use: No  . Sexually Active: Not on file   Other Topics Concern  . Not on file   Social History Narrative  . No narrative on file    History reviewed. No pertinent family history.  Past Medical History  Diagnosis Date  . Multiple sclerosis   . Breast cancer   . Ovarian cyst   . Hypothyroidism   . Mitral valve regurgitation     Mitral valve replacement April, 2013, Mitral valve prolapse  . CHF (congestive heart failure)     Related to severe mitral regurgitation, April, 2013  . Shortness of breath     Related to severe mitral regurgitation, April, 2013  . Paroxysmal atrial fibrillation     Rapid atrial fibrillation in-hospital, Rapid cardioversion,  before mitral valve surgery  . Neuromuscular disorder     ms  . Arthritis  knees  . 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 S/P Maze operation for atrial fibrillation 01/26/2012    Complete biatrial lesion set using cryothermy via right mini thoracotomy  . Pulmonary hypertension     Echo, April, 2013, before mitral valve surgery  . 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 Assess by pulmonary team in the hospital April, 2013  . Anxiety   . Neurogenic bladder   . Ejection fraction     EF 60%, echo, April, 2013, with severe MR before mitral valve replacement  . Warfarin anticoagulation     Mechanical mitral prosthesis, April, 45409    Past Surgical History  Procedure Date  . Breast lumpectomy   . Lymphadenectomy   . Wrist surgery   . Hernia repair   . Tonsillectomy   . Cystoscopy   . Laparoscopy   . Tee without cardioversion 01/19/2012    Procedure: TRANSESOPHAGEAL ECHOCARDIOGRAM (TEE);  Surgeon: Peter M Swaziland, MD;  Location: [facility] ENDOSCOPY;  Service: Cardiovascular;  Laterality: N/A;  . Maze 01/26/2012    Procedure: MAZE;  Surgeon: Purcell Nails, MD;  Location: [facility] OR;  Service:  Open Heart Surgery;  Laterality: N/A;  . Mitral valve replacement 01/26/2012    Procedure: MINIMALLY INVASIVE MITRAL VALVE (MV) REPLACEMENT;  Surgeon: Purcell Nails, MD;  Location: [facility] OR;  Service: Open Heart Surgery;  Laterality: Right;  . Chest tube insertion 01/26/2012    Procedure: CHEST TUBE INSERTION;  Surgeon: Purcell Nails, MD;  Location: [facility] OR;  Service: Open Heart Surgery;  Laterality: Left;    Patient Active Problem List  Diagnosis  . Multiple sclerosis  . ROSACEA  . OSTEOPENIA  . URINARY INCONTINENCE  . BREAST CANCER, HX OF  . COLONOSCOPY, HX OF  . GENITAL HERPES, HX OF  . Anxiety  . Tachycardia  . Pulmonary hypertension  . Methylprednisolone-induced hypoxia  . Hypokalemia  . COPD with emphysema  . S/P mitral valve replacement  . S/P Maze operation for atrial fibrillation  . First degree heart block  . Mitral valve regurgitation  . Breast cancer  . Hypothyroidism  . CHF (congestive heart failure)  . Shortness of breath  . Atrial fibrillation  . Neuromuscular disorder  . Arthritis  . COPD (chronic obstructive pulmonary disease)  . Neurogenic bladder  . Ejection fraction  . Warfarin anticoagulation    ROS   Patient denies fever, chills, headache, sweats, rash, change in vision, change in hearing, chest pain, cough, nausea vomiting, urinary symptoms. All other systems are reviewed and are negative.  PHYSICAL EXAM  Patient is oriented to person time and place. Affect is normal. She looks quite stable. There is no jugulovenous distention. Lungs are clear. Respiratory effort is nonlabored. Cardiac exam reveals S1 and S2. The closure sound of her mitral prosthesis is crisp. The abdomen is soft. There is no peripheral edema.  Filed Vitals:   09/29/12 1427  BP: 122/70  Pulse: 89  Height: 5\' 9"  (1.753 m)  Weight: 156 lb 12.8 oz (71.124 kg)   EKG is done today and reviewed by me. There is normal sinus rhythm. The rate is 85.  ASSESSMENT & PLAN

## 2012-09-30 ENCOUNTER — Encounter (HOSPITAL_COMMUNITY)
Admission: RE | Admit: 2012-09-30 | Discharge: 2012-09-30 | Disposition: A | Discharge status: Home / Self Care | Payer: Self-pay | Source: Ambulatory Visit | Attending: Cardiology | Admitting: Cardiology

## 2012-10-03 ENCOUNTER — Encounter (HOSPITAL_COMMUNITY)
Admission: RE | Admit: 2012-10-03 | Discharge: 2012-10-03 | Disposition: A | Discharge status: Home / Self Care | Payer: Self-pay | Source: Ambulatory Visit | Attending: Cardiology | Admitting: Cardiology

## 2012-10-05 ENCOUNTER — Encounter (HOSPITAL_COMMUNITY): Payer: Self-pay

## 2012-10-05 DIAGNOSIS — G35 Multiple sclerosis: Secondary | ICD-10-CM | POA: Diagnosis not present

## 2012-10-07 ENCOUNTER — Encounter (HOSPITAL_COMMUNITY)
Admission: RE | Admit: 2012-10-07 | Discharge: 2012-10-07 | Disposition: A | Discharge status: Home / Self Care | Payer: Self-pay | Source: Ambulatory Visit | Attending: Cardiology | Admitting: Cardiology

## 2012-10-10 ENCOUNTER — Encounter (HOSPITAL_COMMUNITY)
Admission: RE | Admit: 2012-10-10 | Discharge: 2012-10-10 | Disposition: A | Discharge status: Home / Self Care | Payer: Self-pay | Source: Ambulatory Visit | Attending: Cardiology | Admitting: Cardiology

## 2012-10-14 ENCOUNTER — Encounter (HOSPITAL_COMMUNITY): Payer: Self-pay

## 2012-10-17 ENCOUNTER — Encounter (HOSPITAL_COMMUNITY): Payer: Self-pay

## 2012-10-21 ENCOUNTER — Encounter (HOSPITAL_COMMUNITY): Payer: Self-pay

## 2012-10-21 DIAGNOSIS — I509 Heart failure, unspecified: Secondary | ICD-10-CM | POA: Insufficient documentation

## 2012-10-21 DIAGNOSIS — Z7901 Long term (current) use of anticoagulants: Secondary | ICD-10-CM | POA: Insufficient documentation

## 2012-10-21 DIAGNOSIS — G35 Multiple sclerosis: Secondary | ICD-10-CM | POA: Insufficient documentation

## 2012-10-21 DIAGNOSIS — I2789 Other specified pulmonary heart diseases: Secondary | ICD-10-CM | POA: Insufficient documentation

## 2012-10-21 DIAGNOSIS — Z79899 Other long term (current) drug therapy: Secondary | ICD-10-CM | POA: Insufficient documentation

## 2012-10-21 DIAGNOSIS — Z881 Allergy status to other antibiotic agents status: Secondary | ICD-10-CM | POA: Insufficient documentation

## 2012-10-21 DIAGNOSIS — E039 Hypothyroidism, unspecified: Secondary | ICD-10-CM | POA: Insufficient documentation

## 2012-10-21 DIAGNOSIS — R000 Tachycardia, unspecified: Secondary | ICD-10-CM | POA: Insufficient documentation

## 2012-10-21 DIAGNOSIS — M949 Disorder of cartilage, unspecified: Secondary | ICD-10-CM | POA: Insufficient documentation

## 2012-10-21 DIAGNOSIS — Z853 Personal history of malignant neoplasm of breast: Secondary | ICD-10-CM | POA: Insufficient documentation

## 2012-10-21 DIAGNOSIS — M899 Disorder of bone, unspecified: Secondary | ICD-10-CM | POA: Insufficient documentation

## 2012-10-21 DIAGNOSIS — Z9889 Other specified postprocedural states: Secondary | ICD-10-CM | POA: Insufficient documentation

## 2012-10-21 DIAGNOSIS — F411 Generalized anxiety disorder: Secondary | ICD-10-CM | POA: Insufficient documentation

## 2012-10-21 DIAGNOSIS — Z5189 Encounter for other specified aftercare: Secondary | ICD-10-CM | POA: Insufficient documentation

## 2012-10-21 DIAGNOSIS — I4891 Unspecified atrial fibrillation: Secondary | ICD-10-CM | POA: Insufficient documentation

## 2012-10-21 DIAGNOSIS — I440 Atrioventricular block, first degree: Secondary | ICD-10-CM | POA: Insufficient documentation

## 2012-10-21 DIAGNOSIS — I059 Rheumatic mitral valve disease, unspecified: Secondary | ICD-10-CM | POA: Insufficient documentation

## 2012-10-24 ENCOUNTER — Encounter (HOSPITAL_COMMUNITY): Payer: Self-pay

## 2012-10-24 ENCOUNTER — Ambulatory Visit (INDEPENDENT_AMBULATORY_CARE_PROVIDER_SITE_OTHER): Payer: Medicare Other

## 2012-10-24 DIAGNOSIS — Z8679 Personal history of other diseases of the circulatory system: Secondary | ICD-10-CM

## 2012-10-24 DIAGNOSIS — I509 Heart failure, unspecified: Secondary | ICD-10-CM

## 2012-10-24 DIAGNOSIS — Z7901 Long term (current) use of anticoagulants: Secondary | ICD-10-CM

## 2012-10-24 DIAGNOSIS — I4891 Unspecified atrial fibrillation: Secondary | ICD-10-CM

## 2012-10-24 DIAGNOSIS — Z9889 Other specified postprocedural states: Secondary | ICD-10-CM

## 2012-10-24 DIAGNOSIS — Z954 Presence of other heart-valve replacement: Secondary | ICD-10-CM | POA: Diagnosis not present

## 2012-10-24 DIAGNOSIS — Z952 Presence of prosthetic heart valve: Secondary | ICD-10-CM

## 2012-10-24 LAB — POCT INR: INR: 4.3

## 2012-10-25 DIAGNOSIS — G35 Multiple sclerosis: Secondary | ICD-10-CM | POA: Diagnosis not present

## 2012-10-26 ENCOUNTER — Encounter (HOSPITAL_COMMUNITY): Payer: Self-pay

## 2012-10-27 ENCOUNTER — Other Ambulatory Visit (HOSPITAL_COMMUNITY): Payer: Medicare Other

## 2012-10-28 ENCOUNTER — Encounter (HOSPITAL_COMMUNITY): Payer: Self-pay

## 2012-10-31 ENCOUNTER — Encounter (HOSPITAL_COMMUNITY)
Admission: RE | Admit: 2012-10-31 | Discharge: 2012-10-31 | Disposition: A | Discharge status: Home / Self Care | Payer: Self-pay | Source: Ambulatory Visit | Attending: Cardiology | Admitting: Cardiology

## 2012-11-01 ENCOUNTER — Ambulatory Visit (HOSPITAL_COMMUNITY): Payer: Medicare Other | Attending: Cardiology

## 2012-11-01 DIAGNOSIS — I369 Nonrheumatic tricuspid valve disorder, unspecified: Secondary | ICD-10-CM | POA: Insufficient documentation

## 2012-11-01 DIAGNOSIS — J449 Chronic obstructive pulmonary disease, unspecified: Secondary | ICD-10-CM | POA: Diagnosis not present

## 2012-11-01 DIAGNOSIS — I379 Nonrheumatic pulmonary valve disorder, unspecified: Secondary | ICD-10-CM | POA: Diagnosis not present

## 2012-11-01 DIAGNOSIS — I059 Rheumatic mitral valve disease, unspecified: Secondary | ICD-10-CM | POA: Diagnosis not present

## 2012-11-01 DIAGNOSIS — I509 Heart failure, unspecified: Secondary | ICD-10-CM | POA: Diagnosis not present

## 2012-11-01 DIAGNOSIS — Z952 Presence of prosthetic heart valve: Secondary | ICD-10-CM

## 2012-11-01 DIAGNOSIS — I2789 Other specified pulmonary heart diseases: Secondary | ICD-10-CM | POA: Insufficient documentation

## 2012-11-01 DIAGNOSIS — J4489 Other specified chronic obstructive pulmonary disease: Secondary | ICD-10-CM | POA: Insufficient documentation

## 2012-11-01 NOTE — Progress Notes (Signed)
Echocardiogram performed.  

## 2012-11-02 ENCOUNTER — Encounter (HOSPITAL_COMMUNITY): Payer: Self-pay

## 2012-11-04 ENCOUNTER — Encounter (HOSPITAL_COMMUNITY)
Admission: RE | Admit: 2012-11-04 | Discharge: 2012-11-04 | Disposition: A | Discharge status: Home / Self Care | Payer: Self-pay | Source: Ambulatory Visit | Attending: Cardiology | Admitting: Cardiology

## 2012-11-06 ENCOUNTER — Encounter: Payer: Self-pay | Admitting: Cardiology

## 2012-11-07 ENCOUNTER — Ambulatory Visit (INDEPENDENT_AMBULATORY_CARE_PROVIDER_SITE_OTHER): Payer: Medicare Other | Admitting: *Deleted

## 2012-11-07 ENCOUNTER — Encounter (HOSPITAL_COMMUNITY)
Admission: RE | Admit: 2012-11-07 | Discharge: 2012-11-07 | Disposition: A | Discharge status: Home / Self Care | Payer: Self-pay | Source: Ambulatory Visit | Attending: Cardiology | Admitting: Cardiology

## 2012-11-07 DIAGNOSIS — I509 Heart failure, unspecified: Secondary | ICD-10-CM

## 2012-11-07 DIAGNOSIS — Z8679 Personal history of other diseases of the circulatory system: Secondary | ICD-10-CM

## 2012-11-07 DIAGNOSIS — I4891 Unspecified atrial fibrillation: Secondary | ICD-10-CM | POA: Diagnosis not present

## 2012-11-07 DIAGNOSIS — Z9889 Other specified postprocedural states: Secondary | ICD-10-CM

## 2012-11-07 DIAGNOSIS — Z7901 Long term (current) use of anticoagulants: Secondary | ICD-10-CM

## 2012-11-07 DIAGNOSIS — Z954 Presence of other heart-valve replacement: Secondary | ICD-10-CM | POA: Diagnosis not present

## 2012-11-07 DIAGNOSIS — Z952 Presence of prosthetic heart valve: Secondary | ICD-10-CM

## 2012-11-07 LAB — POCT INR: INR: 3.2

## 2012-11-09 ENCOUNTER — Encounter (HOSPITAL_COMMUNITY)
Admission: RE | Admit: 2012-11-09 | Discharge: 2012-11-09 | Disposition: A | Discharge status: Home / Self Care | Payer: Self-pay | Source: Ambulatory Visit | Attending: Cardiology | Admitting: Cardiology

## 2012-11-11 ENCOUNTER — Encounter (HOSPITAL_COMMUNITY): Payer: Self-pay

## 2012-11-14 ENCOUNTER — Encounter (HOSPITAL_COMMUNITY)
Admission: RE | Admit: 2012-11-14 | Discharge: 2012-11-14 | Disposition: A | Discharge status: Home / Self Care | Payer: Self-pay | Source: Ambulatory Visit | Attending: Cardiology | Admitting: Cardiology

## 2012-11-16 ENCOUNTER — Encounter (HOSPITAL_COMMUNITY): Payer: Self-pay

## 2012-11-17 DIAGNOSIS — G35 Multiple sclerosis: Secondary | ICD-10-CM | POA: Diagnosis not present

## 2012-11-18 ENCOUNTER — Encounter (HOSPITAL_COMMUNITY)
Admission: RE | Admit: 2012-11-18 | Discharge: 2012-11-18 | Disposition: A | Discharge status: Home / Self Care | Payer: Self-pay | Source: Ambulatory Visit | Attending: Cardiology | Admitting: Cardiology

## 2012-11-21 ENCOUNTER — Encounter (HOSPITAL_COMMUNITY)
Admission: RE | Admit: 2012-11-21 | Discharge: 2012-11-21 | Disposition: A | Discharge status: Home / Self Care | Payer: Self-pay | Source: Ambulatory Visit | Attending: Cardiology | Admitting: Cardiology

## 2012-11-21 DIAGNOSIS — F411 Generalized anxiety disorder: Secondary | ICD-10-CM | POA: Insufficient documentation

## 2012-11-21 DIAGNOSIS — Z5189 Encounter for other specified aftercare: Secondary | ICD-10-CM | POA: Insufficient documentation

## 2012-11-21 DIAGNOSIS — Z9889 Other specified postprocedural states: Secondary | ICD-10-CM | POA: Insufficient documentation

## 2012-11-21 DIAGNOSIS — Z881 Allergy status to other antibiotic agents status: Secondary | ICD-10-CM | POA: Insufficient documentation

## 2012-11-21 DIAGNOSIS — I440 Atrioventricular block, first degree: Secondary | ICD-10-CM | POA: Insufficient documentation

## 2012-11-21 DIAGNOSIS — I2789 Other specified pulmonary heart diseases: Secondary | ICD-10-CM | POA: Insufficient documentation

## 2012-11-21 DIAGNOSIS — I4891 Unspecified atrial fibrillation: Secondary | ICD-10-CM | POA: Insufficient documentation

## 2012-11-21 DIAGNOSIS — Z853 Personal history of malignant neoplasm of breast: Secondary | ICD-10-CM | POA: Insufficient documentation

## 2012-11-21 DIAGNOSIS — M949 Disorder of cartilage, unspecified: Secondary | ICD-10-CM | POA: Insufficient documentation

## 2012-11-21 DIAGNOSIS — I509 Heart failure, unspecified: Secondary | ICD-10-CM | POA: Insufficient documentation

## 2012-11-21 DIAGNOSIS — R000 Tachycardia, unspecified: Secondary | ICD-10-CM | POA: Insufficient documentation

## 2012-11-21 DIAGNOSIS — E039 Hypothyroidism, unspecified: Secondary | ICD-10-CM | POA: Insufficient documentation

## 2012-11-21 DIAGNOSIS — I059 Rheumatic mitral valve disease, unspecified: Secondary | ICD-10-CM | POA: Insufficient documentation

## 2012-11-21 DIAGNOSIS — Z7901 Long term (current) use of anticoagulants: Secondary | ICD-10-CM | POA: Insufficient documentation

## 2012-11-21 DIAGNOSIS — G35 Multiple sclerosis: Secondary | ICD-10-CM | POA: Insufficient documentation

## 2012-11-21 DIAGNOSIS — M899 Disorder of bone, unspecified: Secondary | ICD-10-CM | POA: Insufficient documentation

## 2012-11-21 DIAGNOSIS — Z79899 Other long term (current) drug therapy: Secondary | ICD-10-CM | POA: Insufficient documentation

## 2012-11-23 ENCOUNTER — Encounter (HOSPITAL_COMMUNITY): Payer: Self-pay

## 2012-11-25 ENCOUNTER — Encounter (HOSPITAL_COMMUNITY)
Admission: RE | Admit: 2012-11-25 | Discharge: 2012-11-25 | Disposition: A | Discharge status: Home / Self Care | Payer: Self-pay | Source: Ambulatory Visit | Attending: Cardiology | Admitting: Cardiology

## 2012-11-28 ENCOUNTER — Ambulatory Visit (INDEPENDENT_AMBULATORY_CARE_PROVIDER_SITE_OTHER): Payer: Medicare Other | Admitting: *Deleted

## 2012-11-28 ENCOUNTER — Encounter (HOSPITAL_COMMUNITY)
Admission: RE | Admit: 2012-11-28 | Discharge: 2012-11-28 | Disposition: A | Discharge status: Home / Self Care | Payer: Self-pay | Source: Ambulatory Visit | Attending: Cardiology | Admitting: Cardiology

## 2012-11-28 DIAGNOSIS — Z8679 Personal history of other diseases of the circulatory system: Secondary | ICD-10-CM

## 2012-11-28 DIAGNOSIS — Z7901 Long term (current) use of anticoagulants: Secondary | ICD-10-CM

## 2012-11-28 DIAGNOSIS — I509 Heart failure, unspecified: Secondary | ICD-10-CM | POA: Diagnosis not present

## 2012-11-28 DIAGNOSIS — Z9889 Other specified postprocedural states: Secondary | ICD-10-CM

## 2012-11-28 DIAGNOSIS — I4891 Unspecified atrial fibrillation: Secondary | ICD-10-CM | POA: Diagnosis not present

## 2012-11-28 DIAGNOSIS — Z952 Presence of prosthetic heart valve: Secondary | ICD-10-CM

## 2012-11-28 DIAGNOSIS — Z954 Presence of other heart-valve replacement: Secondary | ICD-10-CM

## 2012-11-30 ENCOUNTER — Encounter (HOSPITAL_COMMUNITY): Payer: Self-pay

## 2012-12-02 ENCOUNTER — Encounter (HOSPITAL_COMMUNITY): Payer: Self-pay

## 2012-12-02 DIAGNOSIS — G35 Multiple sclerosis: Secondary | ICD-10-CM | POA: Diagnosis not present

## 2012-12-05 ENCOUNTER — Encounter (HOSPITAL_COMMUNITY)
Admission: RE | Admit: 2012-12-05 | Discharge: 2012-12-05 | Disposition: A | Discharge status: Home / Self Care | Payer: Self-pay | Source: Ambulatory Visit | Attending: Cardiology | Admitting: Cardiology

## 2012-12-07 ENCOUNTER — Encounter (HOSPITAL_COMMUNITY)
Admission: RE | Admit: 2012-12-07 | Discharge: 2012-12-07 | Disposition: A | Discharge status: Home / Self Care | Payer: Self-pay | Source: Ambulatory Visit | Attending: Cardiology | Admitting: Cardiology

## 2012-12-09 ENCOUNTER — Encounter (HOSPITAL_COMMUNITY): Payer: Self-pay

## 2012-12-12 ENCOUNTER — Encounter (HOSPITAL_COMMUNITY)
Admission: RE | Admit: 2012-12-12 | Discharge: 2012-12-12 | Disposition: A | Discharge status: Home / Self Care | Payer: Self-pay | Source: Ambulatory Visit | Attending: Cardiology | Admitting: Cardiology

## 2012-12-14 ENCOUNTER — Encounter (HOSPITAL_COMMUNITY): Payer: Self-pay

## 2012-12-16 ENCOUNTER — Encounter (HOSPITAL_COMMUNITY)
Admission: RE | Admit: 2012-12-16 | Discharge: 2012-12-16 | Disposition: A | Discharge status: Home / Self Care | Payer: Self-pay | Source: Ambulatory Visit | Attending: Cardiology | Admitting: Cardiology

## 2012-12-19 ENCOUNTER — Encounter (HOSPITAL_COMMUNITY): Payer: Self-pay

## 2012-12-19 DIAGNOSIS — M899 Disorder of bone, unspecified: Secondary | ICD-10-CM | POA: Insufficient documentation

## 2012-12-19 DIAGNOSIS — R000 Tachycardia, unspecified: Secondary | ICD-10-CM | POA: Insufficient documentation

## 2012-12-19 DIAGNOSIS — I4891 Unspecified atrial fibrillation: Secondary | ICD-10-CM | POA: Insufficient documentation

## 2012-12-19 DIAGNOSIS — F411 Generalized anxiety disorder: Secondary | ICD-10-CM | POA: Insufficient documentation

## 2012-12-19 DIAGNOSIS — I440 Atrioventricular block, first degree: Secondary | ICD-10-CM | POA: Insufficient documentation

## 2012-12-19 DIAGNOSIS — E039 Hypothyroidism, unspecified: Secondary | ICD-10-CM | POA: Insufficient documentation

## 2012-12-19 DIAGNOSIS — Z853 Personal history of malignant neoplasm of breast: Secondary | ICD-10-CM | POA: Insufficient documentation

## 2012-12-19 DIAGNOSIS — I2789 Other specified pulmonary heart diseases: Secondary | ICD-10-CM | POA: Insufficient documentation

## 2012-12-19 DIAGNOSIS — Z5189 Encounter for other specified aftercare: Secondary | ICD-10-CM | POA: Insufficient documentation

## 2012-12-19 DIAGNOSIS — Z79899 Other long term (current) drug therapy: Secondary | ICD-10-CM | POA: Insufficient documentation

## 2012-12-19 DIAGNOSIS — I059 Rheumatic mitral valve disease, unspecified: Secondary | ICD-10-CM | POA: Insufficient documentation

## 2012-12-19 DIAGNOSIS — Z881 Allergy status to other antibiotic agents status: Secondary | ICD-10-CM | POA: Insufficient documentation

## 2012-12-19 DIAGNOSIS — Z9889 Other specified postprocedural states: Secondary | ICD-10-CM | POA: Insufficient documentation

## 2012-12-19 DIAGNOSIS — Z7901 Long term (current) use of anticoagulants: Secondary | ICD-10-CM | POA: Insufficient documentation

## 2012-12-19 DIAGNOSIS — I509 Heart failure, unspecified: Secondary | ICD-10-CM | POA: Insufficient documentation

## 2012-12-19 DIAGNOSIS — G35 Multiple sclerosis: Secondary | ICD-10-CM | POA: Insufficient documentation

## 2012-12-21 ENCOUNTER — Encounter (HOSPITAL_COMMUNITY)
Admission: RE | Admit: 2012-12-21 | Discharge: 2012-12-21 | Disposition: A | Discharge status: Home / Self Care | Payer: Self-pay | Source: Ambulatory Visit | Attending: Cardiology | Admitting: Cardiology

## 2012-12-23 ENCOUNTER — Encounter (HOSPITAL_COMMUNITY): Payer: Self-pay

## 2012-12-26 ENCOUNTER — Ambulatory Visit (INDEPENDENT_AMBULATORY_CARE_PROVIDER_SITE_OTHER): Payer: Medicare Other | Admitting: Pharmacist

## 2012-12-26 ENCOUNTER — Encounter (HOSPITAL_COMMUNITY)
Admission: RE | Admit: 2012-12-26 | Discharge: 2012-12-26 | Disposition: A | Discharge status: Home / Self Care | Payer: Self-pay | Source: Ambulatory Visit | Attending: Cardiology | Admitting: Cardiology

## 2012-12-26 DIAGNOSIS — Z8679 Personal history of other diseases of the circulatory system: Secondary | ICD-10-CM

## 2012-12-26 DIAGNOSIS — I4891 Unspecified atrial fibrillation: Secondary | ICD-10-CM | POA: Diagnosis not present

## 2012-12-28 ENCOUNTER — Encounter (HOSPITAL_COMMUNITY): Payer: Self-pay

## 2012-12-30 ENCOUNTER — Encounter (HOSPITAL_COMMUNITY)
Admission: RE | Admit: 2012-12-30 | Discharge: 2012-12-30 | Disposition: A | Discharge status: Home / Self Care | Payer: Self-pay | Source: Ambulatory Visit | Attending: Cardiology | Admitting: Cardiology

## 2013-01-02 ENCOUNTER — Encounter (HOSPITAL_COMMUNITY): Payer: Self-pay

## 2013-01-04 ENCOUNTER — Encounter (HOSPITAL_COMMUNITY)
Admission: RE | Admit: 2013-01-04 | Discharge: 2013-01-04 | Disposition: A | Discharge status: Home / Self Care | Payer: Self-pay | Source: Ambulatory Visit | Attending: Cardiology | Admitting: Cardiology

## 2013-01-06 ENCOUNTER — Encounter (HOSPITAL_COMMUNITY)
Admission: RE | Admit: 2013-01-06 | Discharge: 2013-01-06 | Disposition: A | Discharge status: Home / Self Care | Payer: Self-pay | Source: Ambulatory Visit | Attending: Cardiology | Admitting: Cardiology

## 2013-01-09 ENCOUNTER — Encounter (HOSPITAL_COMMUNITY)
Admission: RE | Admit: 2013-01-09 | Discharge: 2013-01-09 | Disposition: A | Discharge status: Home / Self Care | Payer: Self-pay | Source: Ambulatory Visit | Attending: Cardiology | Admitting: Cardiology

## 2013-01-09 ENCOUNTER — Other Ambulatory Visit: Payer: Self-pay | Admitting: Pharmacist

## 2013-01-09 DIAGNOSIS — Z952 Presence of prosthetic heart valve: Secondary | ICD-10-CM

## 2013-01-09 DIAGNOSIS — Z7901 Long term (current) use of anticoagulants: Secondary | ICD-10-CM

## 2013-01-09 DIAGNOSIS — Z9889 Other specified postprocedural states: Secondary | ICD-10-CM

## 2013-01-09 DIAGNOSIS — I509 Heart failure, unspecified: Secondary | ICD-10-CM

## 2013-01-09 DIAGNOSIS — I4891 Unspecified atrial fibrillation: Secondary | ICD-10-CM

## 2013-01-09 MED ORDER — WARFARIN SODIUM 5 MG PO TABS: ORAL_TABLET | ORAL | Status: DC

## 2013-01-11 ENCOUNTER — Encounter (HOSPITAL_COMMUNITY)
Admission: RE | Admit: 2013-01-11 | Discharge: 2013-01-11 | Disposition: A | Discharge status: Home / Self Care | Payer: Self-pay | Source: Ambulatory Visit | Attending: Cardiology | Admitting: Cardiology

## 2013-01-13 ENCOUNTER — Encounter (HOSPITAL_COMMUNITY): Payer: Self-pay

## 2013-01-16 ENCOUNTER — Encounter (HOSPITAL_COMMUNITY)
Admission: RE | Admit: 2013-01-16 | Discharge: 2013-01-16 | Disposition: A | Discharge status: Home / Self Care | Payer: Self-pay | Source: Ambulatory Visit | Attending: Cardiology | Admitting: Cardiology

## 2013-01-18 ENCOUNTER — Encounter (HOSPITAL_COMMUNITY): Payer: Self-pay

## 2013-01-18 DIAGNOSIS — Z853 Personal history of malignant neoplasm of breast: Secondary | ICD-10-CM | POA: Insufficient documentation

## 2013-01-18 DIAGNOSIS — Z9889 Other specified postprocedural states: Secondary | ICD-10-CM | POA: Insufficient documentation

## 2013-01-18 DIAGNOSIS — Z5189 Encounter for other specified aftercare: Secondary | ICD-10-CM | POA: Insufficient documentation

## 2013-01-18 DIAGNOSIS — G35 Multiple sclerosis: Secondary | ICD-10-CM | POA: Insufficient documentation

## 2013-01-18 DIAGNOSIS — M899 Disorder of bone, unspecified: Secondary | ICD-10-CM | POA: Insufficient documentation

## 2013-01-18 DIAGNOSIS — R000 Tachycardia, unspecified: Secondary | ICD-10-CM | POA: Insufficient documentation

## 2013-01-18 DIAGNOSIS — F411 Generalized anxiety disorder: Secondary | ICD-10-CM | POA: Insufficient documentation

## 2013-01-18 DIAGNOSIS — I509 Heart failure, unspecified: Secondary | ICD-10-CM | POA: Insufficient documentation

## 2013-01-18 DIAGNOSIS — M949 Disorder of cartilage, unspecified: Secondary | ICD-10-CM | POA: Insufficient documentation

## 2013-01-18 DIAGNOSIS — Z881 Allergy status to other antibiotic agents status: Secondary | ICD-10-CM | POA: Insufficient documentation

## 2013-01-18 DIAGNOSIS — Z79899 Other long term (current) drug therapy: Secondary | ICD-10-CM | POA: Insufficient documentation

## 2013-01-18 DIAGNOSIS — I059 Rheumatic mitral valve disease, unspecified: Secondary | ICD-10-CM | POA: Insufficient documentation

## 2013-01-18 DIAGNOSIS — Z7901 Long term (current) use of anticoagulants: Secondary | ICD-10-CM | POA: Insufficient documentation

## 2013-01-18 DIAGNOSIS — I440 Atrioventricular block, first degree: Secondary | ICD-10-CM | POA: Insufficient documentation

## 2013-01-18 DIAGNOSIS — I4891 Unspecified atrial fibrillation: Secondary | ICD-10-CM | POA: Insufficient documentation

## 2013-01-18 DIAGNOSIS — I2789 Other specified pulmonary heart diseases: Secondary | ICD-10-CM | POA: Insufficient documentation

## 2013-01-18 DIAGNOSIS — E039 Hypothyroidism, unspecified: Secondary | ICD-10-CM | POA: Insufficient documentation

## 2013-01-20 ENCOUNTER — Encounter (HOSPITAL_COMMUNITY)
Admission: RE | Admit: 2013-01-20 | Discharge: 2013-01-20 | Disposition: A | Discharge status: Home / Self Care | Payer: Self-pay | Source: Ambulatory Visit | Attending: Cardiology | Admitting: Cardiology

## 2013-01-23 ENCOUNTER — Encounter (HOSPITAL_COMMUNITY)
Admission: RE | Admit: 2013-01-23 | Discharge: 2013-01-23 | Disposition: A | Discharge status: Home / Self Care | Payer: Self-pay | Source: Ambulatory Visit | Attending: Cardiology | Admitting: Cardiology

## 2013-01-25 ENCOUNTER — Encounter (HOSPITAL_COMMUNITY): Payer: Self-pay

## 2013-01-27 ENCOUNTER — Ambulatory Visit (INDEPENDENT_AMBULATORY_CARE_PROVIDER_SITE_OTHER): Payer: Medicare Other | Admitting: *Deleted

## 2013-01-27 ENCOUNTER — Encounter (HOSPITAL_COMMUNITY)
Admission: RE | Admit: 2013-01-27 | Discharge: 2013-01-27 | Disposition: A | Discharge status: Home / Self Care | Payer: Self-pay | Source: Ambulatory Visit | Attending: Cardiology | Admitting: Cardiology

## 2013-01-27 DIAGNOSIS — Z8679 Personal history of other diseases of the circulatory system: Secondary | ICD-10-CM

## 2013-01-27 DIAGNOSIS — Z954 Presence of other heart-valve replacement: Secondary | ICD-10-CM

## 2013-01-27 DIAGNOSIS — Z7901 Long term (current) use of anticoagulants: Secondary | ICD-10-CM | POA: Diagnosis not present

## 2013-01-27 DIAGNOSIS — I4891 Unspecified atrial fibrillation: Secondary | ICD-10-CM | POA: Diagnosis not present

## 2013-01-27 DIAGNOSIS — I509 Heart failure, unspecified: Secondary | ICD-10-CM

## 2013-01-27 DIAGNOSIS — Z9889 Other specified postprocedural states: Secondary | ICD-10-CM

## 2013-01-27 DIAGNOSIS — Z952 Presence of prosthetic heart valve: Secondary | ICD-10-CM

## 2013-01-30 ENCOUNTER — Encounter (HOSPITAL_COMMUNITY)
Admission: RE | Admit: 2013-01-30 | Discharge: 2013-01-30 | Disposition: A | Discharge status: Home / Self Care | Payer: Self-pay | Source: Ambulatory Visit | Attending: Cardiology | Admitting: Cardiology

## 2013-02-01 ENCOUNTER — Encounter (HOSPITAL_COMMUNITY): Payer: Self-pay

## 2013-02-03 ENCOUNTER — Encounter (HOSPITAL_COMMUNITY)
Admission: RE | Admit: 2013-02-03 | Discharge: 2013-02-03 | Disposition: A | Discharge status: Home / Self Care | Payer: Self-pay | Source: Ambulatory Visit | Attending: Cardiology | Admitting: Cardiology

## 2013-02-06 ENCOUNTER — Encounter (HOSPITAL_COMMUNITY)
Admission: RE | Admit: 2013-02-06 | Discharge: 2013-02-06 | Disposition: A | Discharge status: Home / Self Care | Payer: Self-pay | Source: Ambulatory Visit | Attending: Cardiology | Admitting: Cardiology

## 2013-02-07 DIAGNOSIS — G35 Multiple sclerosis: Secondary | ICD-10-CM | POA: Diagnosis not present

## 2013-02-08 ENCOUNTER — Encounter (HOSPITAL_COMMUNITY): Payer: Self-pay

## 2013-02-10 ENCOUNTER — Encounter (HOSPITAL_COMMUNITY)
Admission: RE | Admit: 2013-02-10 | Discharge: 2013-02-10 | Disposition: A | Discharge status: Home / Self Care | Payer: Self-pay | Source: Ambulatory Visit | Attending: Cardiology | Admitting: Cardiology

## 2013-02-13 ENCOUNTER — Encounter (HOSPITAL_COMMUNITY)
Admission: RE | Admit: 2013-02-13 | Discharge: 2013-02-13 | Disposition: A | Discharge status: Home / Self Care | Payer: Self-pay | Source: Ambulatory Visit | Attending: Cardiology | Admitting: Cardiology

## 2013-02-15 ENCOUNTER — Encounter (HOSPITAL_COMMUNITY): Payer: Self-pay

## 2013-02-17 ENCOUNTER — Encounter (HOSPITAL_COMMUNITY)
Admission: RE | Admit: 2013-02-17 | Discharge: 2013-02-17 | Disposition: A | Discharge status: Home / Self Care | Payer: Self-pay | Source: Ambulatory Visit | Attending: Cardiology | Admitting: Cardiology

## 2013-02-17 ENCOUNTER — Ambulatory Visit (INDEPENDENT_AMBULATORY_CARE_PROVIDER_SITE_OTHER): Payer: Medicare Other | Admitting: Pharmacist

## 2013-02-17 DIAGNOSIS — Z954 Presence of other heart-valve replacement: Secondary | ICD-10-CM | POA: Diagnosis not present

## 2013-02-17 DIAGNOSIS — Z881 Allergy status to other antibiotic agents status: Secondary | ICD-10-CM | POA: Insufficient documentation

## 2013-02-17 DIAGNOSIS — I440 Atrioventricular block, first degree: Secondary | ICD-10-CM | POA: Insufficient documentation

## 2013-02-17 DIAGNOSIS — I2789 Other specified pulmonary heart diseases: Secondary | ICD-10-CM | POA: Insufficient documentation

## 2013-02-17 DIAGNOSIS — I4891 Unspecified atrial fibrillation: Secondary | ICD-10-CM | POA: Insufficient documentation

## 2013-02-17 DIAGNOSIS — Z853 Personal history of malignant neoplasm of breast: Secondary | ICD-10-CM | POA: Insufficient documentation

## 2013-02-17 DIAGNOSIS — G35 Multiple sclerosis: Secondary | ICD-10-CM | POA: Insufficient documentation

## 2013-02-17 DIAGNOSIS — Z79899 Other long term (current) drug therapy: Secondary | ICD-10-CM | POA: Insufficient documentation

## 2013-02-17 DIAGNOSIS — Z9889 Other specified postprocedural states: Secondary | ICD-10-CM

## 2013-02-17 DIAGNOSIS — Z5189 Encounter for other specified aftercare: Secondary | ICD-10-CM | POA: Insufficient documentation

## 2013-02-17 DIAGNOSIS — E039 Hypothyroidism, unspecified: Secondary | ICD-10-CM | POA: Insufficient documentation

## 2013-02-17 DIAGNOSIS — R000 Tachycardia, unspecified: Secondary | ICD-10-CM | POA: Insufficient documentation

## 2013-02-17 DIAGNOSIS — Z7901 Long term (current) use of anticoagulants: Secondary | ICD-10-CM

## 2013-02-17 DIAGNOSIS — I509 Heart failure, unspecified: Secondary | ICD-10-CM | POA: Diagnosis not present

## 2013-02-17 DIAGNOSIS — I059 Rheumatic mitral valve disease, unspecified: Secondary | ICD-10-CM | POA: Insufficient documentation

## 2013-02-17 DIAGNOSIS — Z952 Presence of prosthetic heart valve: Secondary | ICD-10-CM

## 2013-02-17 DIAGNOSIS — F411 Generalized anxiety disorder: Secondary | ICD-10-CM | POA: Insufficient documentation

## 2013-02-17 DIAGNOSIS — M899 Disorder of bone, unspecified: Secondary | ICD-10-CM | POA: Insufficient documentation

## 2013-02-17 LAB — POCT INR: INR: 3.4

## 2013-02-20 ENCOUNTER — Encounter (HOSPITAL_COMMUNITY)
Admission: RE | Admit: 2013-02-20 | Discharge: 2013-02-20 | Disposition: A | Discharge status: Home / Self Care | Payer: Self-pay | Source: Ambulatory Visit | Attending: Cardiology | Admitting: Cardiology

## 2013-02-22 ENCOUNTER — Encounter (HOSPITAL_COMMUNITY): Payer: Self-pay

## 2013-02-24 ENCOUNTER — Encounter (HOSPITAL_COMMUNITY)
Admission: RE | Admit: 2013-02-24 | Discharge: 2013-02-24 | Disposition: A | Discharge status: Home / Self Care | Payer: Self-pay | Source: Ambulatory Visit | Attending: Cardiology | Admitting: Cardiology

## 2013-02-27 ENCOUNTER — Ambulatory Visit: Payer: Medicare Other | Admitting: Thoracic Surgery (Cardiothoracic Vascular Surgery)

## 2013-02-27 ENCOUNTER — Encounter (HOSPITAL_COMMUNITY): Payer: Self-pay

## 2013-03-01 ENCOUNTER — Encounter (HOSPITAL_COMMUNITY): Payer: Self-pay

## 2013-03-03 ENCOUNTER — Encounter (HOSPITAL_COMMUNITY): Payer: Self-pay

## 2013-03-06 ENCOUNTER — Encounter (HOSPITAL_COMMUNITY): Payer: Self-pay

## 2013-03-08 ENCOUNTER — Encounter (HOSPITAL_COMMUNITY): Payer: Self-pay

## 2013-03-10 ENCOUNTER — Encounter (HOSPITAL_COMMUNITY): Payer: Self-pay

## 2013-03-15 ENCOUNTER — Encounter (HOSPITAL_COMMUNITY): Payer: Self-pay

## 2013-03-16 DIAGNOSIS — E559 Vitamin D deficiency, unspecified: Secondary | ICD-10-CM | POA: Diagnosis not present

## 2013-03-16 DIAGNOSIS — R7989 Other specified abnormal findings of blood chemistry: Secondary | ICD-10-CM | POA: Diagnosis not present

## 2013-03-16 DIAGNOSIS — G35 Multiple sclerosis: Secondary | ICD-10-CM | POA: Diagnosis not present

## 2013-03-16 DIAGNOSIS — E039 Hypothyroidism, unspecified: Secondary | ICD-10-CM | POA: Diagnosis not present

## 2013-03-17 ENCOUNTER — Ambulatory Visit (INDEPENDENT_AMBULATORY_CARE_PROVIDER_SITE_OTHER): Payer: Medicare Other | Admitting: *Deleted

## 2013-03-17 ENCOUNTER — Encounter (HOSPITAL_COMMUNITY): Admission: RE | Admit: 2013-03-17 | Payer: Self-pay | Source: Ambulatory Visit

## 2013-03-17 DIAGNOSIS — I4891 Unspecified atrial fibrillation: Secondary | ICD-10-CM

## 2013-03-17 DIAGNOSIS — Z7901 Long term (current) use of anticoagulants: Secondary | ICD-10-CM

## 2013-03-17 DIAGNOSIS — Z954 Presence of other heart-valve replacement: Secondary | ICD-10-CM

## 2013-03-17 DIAGNOSIS — Z9889 Other specified postprocedural states: Secondary | ICD-10-CM | POA: Diagnosis not present

## 2013-03-17 DIAGNOSIS — Z952 Presence of prosthetic heart valve: Secondary | ICD-10-CM

## 2013-03-17 DIAGNOSIS — Z8679 Personal history of other diseases of the circulatory system: Secondary | ICD-10-CM

## 2013-03-20 ENCOUNTER — Encounter (HOSPITAL_COMMUNITY)
Admission: RE | Admit: 2013-03-20 | Discharge: 2013-03-20 | Disposition: A | Discharge status: Home / Self Care | Payer: Self-pay | Source: Ambulatory Visit | Attending: Cardiology | Admitting: Cardiology

## 2013-03-20 ENCOUNTER — Ambulatory Visit: Payer: Medicare Other | Admitting: Thoracic Surgery (Cardiothoracic Vascular Surgery)

## 2013-03-20 DIAGNOSIS — Z853 Personal history of malignant neoplasm of breast: Secondary | ICD-10-CM | POA: Insufficient documentation

## 2013-03-20 DIAGNOSIS — Z5189 Encounter for other specified aftercare: Secondary | ICD-10-CM | POA: Insufficient documentation

## 2013-03-20 DIAGNOSIS — R000 Tachycardia, unspecified: Secondary | ICD-10-CM | POA: Insufficient documentation

## 2013-03-20 DIAGNOSIS — Z79899 Other long term (current) drug therapy: Secondary | ICD-10-CM | POA: Insufficient documentation

## 2013-03-20 DIAGNOSIS — M899 Disorder of bone, unspecified: Secondary | ICD-10-CM | POA: Insufficient documentation

## 2013-03-20 DIAGNOSIS — I059 Rheumatic mitral valve disease, unspecified: Secondary | ICD-10-CM | POA: Insufficient documentation

## 2013-03-20 DIAGNOSIS — I440 Atrioventricular block, first degree: Secondary | ICD-10-CM | POA: Insufficient documentation

## 2013-03-20 DIAGNOSIS — M949 Disorder of cartilage, unspecified: Secondary | ICD-10-CM | POA: Insufficient documentation

## 2013-03-20 DIAGNOSIS — E039 Hypothyroidism, unspecified: Secondary | ICD-10-CM | POA: Insufficient documentation

## 2013-03-20 DIAGNOSIS — I2789 Other specified pulmonary heart diseases: Secondary | ICD-10-CM | POA: Insufficient documentation

## 2013-03-20 DIAGNOSIS — I4891 Unspecified atrial fibrillation: Secondary | ICD-10-CM | POA: Insufficient documentation

## 2013-03-20 DIAGNOSIS — Z9889 Other specified postprocedural states: Secondary | ICD-10-CM | POA: Insufficient documentation

## 2013-03-20 DIAGNOSIS — F411 Generalized anxiety disorder: Secondary | ICD-10-CM | POA: Insufficient documentation

## 2013-03-20 DIAGNOSIS — I509 Heart failure, unspecified: Secondary | ICD-10-CM | POA: Insufficient documentation

## 2013-03-20 DIAGNOSIS — Z7901 Long term (current) use of anticoagulants: Secondary | ICD-10-CM | POA: Insufficient documentation

## 2013-03-20 DIAGNOSIS — Z881 Allergy status to other antibiotic agents status: Secondary | ICD-10-CM | POA: Insufficient documentation

## 2013-03-20 DIAGNOSIS — G35 Multiple sclerosis: Secondary | ICD-10-CM | POA: Insufficient documentation

## 2013-03-22 ENCOUNTER — Encounter (HOSPITAL_COMMUNITY): Payer: Self-pay

## 2013-03-22 DIAGNOSIS — G35 Multiple sclerosis: Secondary | ICD-10-CM | POA: Diagnosis not present

## 2013-03-23 DIAGNOSIS — G43809 Other migraine, not intractable, without status migrainosus: Secondary | ICD-10-CM | POA: Diagnosis not present

## 2013-03-24 ENCOUNTER — Encounter (HOSPITAL_COMMUNITY)
Admission: RE | Admit: 2013-03-24 | Discharge: 2013-03-24 | Disposition: A | Discharge status: Home / Self Care | Payer: Self-pay | Source: Ambulatory Visit | Attending: Cardiology | Admitting: Cardiology

## 2013-03-27 ENCOUNTER — Encounter: Payer: Self-pay | Admitting: Thoracic Surgery (Cardiothoracic Vascular Surgery)

## 2013-03-27 ENCOUNTER — Ambulatory Visit (INDEPENDENT_AMBULATORY_CARE_PROVIDER_SITE_OTHER): Payer: Medicare Other | Admitting: Thoracic Surgery (Cardiothoracic Vascular Surgery)

## 2013-03-27 ENCOUNTER — Encounter (HOSPITAL_COMMUNITY)
Admission: RE | Admit: 2013-03-27 | Discharge: 2013-03-27 | Disposition: A | Discharge status: Home / Self Care | Payer: Self-pay | Source: Ambulatory Visit | Attending: Cardiology | Admitting: Cardiology

## 2013-03-27 VITALS — Resp 18 | Ht 69.0 in | Wt 156.0 lb

## 2013-03-27 DIAGNOSIS — Z952 Presence of prosthetic heart valve: Secondary | ICD-10-CM

## 2013-03-27 DIAGNOSIS — I059 Rheumatic mitral valve disease, unspecified: Secondary | ICD-10-CM

## 2013-03-27 DIAGNOSIS — Z9889 Other specified postprocedural states: Secondary | ICD-10-CM | POA: Diagnosis not present

## 2013-03-27 DIAGNOSIS — Z954 Presence of other heart-valve replacement: Secondary | ICD-10-CM

## 2013-03-27 DIAGNOSIS — Z8679 Personal history of other diseases of the circulatory system: Secondary | ICD-10-CM

## 2013-03-27 NOTE — Progress Notes (Signed)
[address]       [name] [address]             [phone]     CARDIOTHORACIC SURGERY OFFICE NOTE  Referring Provider is Luis Abed, MD PCP is Henri Medal, MD   HPI:  Patient returns for followup and rhythm surveillance more than one year status post minimally invasive mitral valve replacement with a mechanical prosthesis and Maze procedure. She was last seen here in the office 08/08/2012. Since then she has continued to do exceptionally well. She enjoys fairly normal exercise tolerance and she continues to participate in the maintenance program in the cardiac rehabilitation program. She has not had any problems with Coumadin therapy. She has no shortness of breath reports no history of any sort of tachypalpitations. He feels well and has no complaints.   Current Outpatient Prescriptions  Medication Sig Dispense Refill  . Black Cohosh 40 MG CAPS Take 1 capsule by mouth daily.      . Bromelains (BROMELAIN PO) Take 1 capsule by mouth 2 (two) times daily.      . Cholecalciferol (VITAMIN D) 2000 UNITS CAPS Take 6,000 Units by mouth daily.      . Coenzyme Q10 (CO Q-10) 100 MG CAPS Take 1 capsule by mouth 2 (two) times daily.       . Cranberry 500 MG CAPS Take 1 capsule by mouth daily.      Marland Kitchen L-THEANINE PO Take 1 capsule by mouth daily.       Marland Kitchen lactose free nutrition (BOOST PLUS) LIQD Take 237 mLs by mouth daily.       Marland Kitchen Lysine 500 MG CAPS Take 1 capsule by mouth daily.      . metoprolol tartrate (LOPRESSOR) 25 MG tablet Take 50 mg by mouth 2 (two) times daily.      Marland Kitchen MILK THISTLE PO Take 1 capsule by mouth 2 (two) times daily.      Gracelyn Nurse Leaf 500 MG CAPS Take by mouth daily.      Marland Kitchen OVER THE COUNTER MEDICATION Red marine algae - 2 capsules daily      . PROGESTERONE MICRONIZED PO Take 50 mg by mouth 2 (two) times daily.       Marland Kitchen thyroid (ARMOUR) 60 MG tablet Take 60 mg by mouth daily.      . vitamin A 91478 UNIT capsule Take 10,000 Units by mouth  daily.      . vitamin E 400 UNIT capsule Take 400 Units by mouth daily.      Marland Kitchen warfarin (COUMADIN) 5 MG tablet Take as directed by [facility]  40 tablet  3   No current facility-administered medications for this visit.      Physical Exam:   Resp 18  Ht 5\' 9"  (1.753 m)  Wt 156 lb (70.761 kg)  BMI 23.03 kg/m2  SpO2 97%  General:  Well-appearing  Chest:   Clear to auscultation  CV:   Regular rate and rhythm with mechanical heart sounds  Incisions:  Completely healed  Abdomen:  Soft and nontender  Extremities:  Warm and well-perfused  Diagnostic Tests:  2 channel telemetry rhythm strip demonstrates normal sinus rhythm   Transthoracic Echocardiography  Patient: [name], [name] MR #: [id number] Study Date: 11/01/2012 Gender: F Age: 61 Height: 175.3cm Weight: 70.8kg BSA: 1.68m^2 Pt. Status: Room:  ATTENDING Angelina Pih, Tinnie Gens REFERRING Willa Rough PERFORMING [facility], [facility] SONOGRAPHER Philomena Course, RDCS cc:  ------------------------------------------------------------  LV EF: 50% - 55%  ------------------------------------------------------------ Indications: Mitral valve disease 424.0 CHF - 428.0. Prosthetic valve.  ------------------------------------------------------------ History: PMH: Breast cancer. Maze procedure. Pulmonary hypertension. Acquired from the patient and from the patient's chart. Dyspnea. Paroxysmal Atrial fibrillation. Tachycardia. Congestive heart failure. Mitral valve prolapse. Severe mitral regurgitation, post prosthetic replacement. Chronic obstructive pulmonary disease.  ------------------------------------------------------------ Study Conclusions  - Left ventricle: The cavity size was normal. Wall thickness was normal. Systolic function was normal. The estimated ejection fraction was in the range of 50% to 55%. Wall motion was normal; there were no regional wall motion abnormalities. - Mitral  valve: A mechanical prosthesis was present. Transthoracic echocardiography. M-mode, complete 2D, spectral Doppler, and color Doppler. Height: Height: 175.3cm. Height: 69in. Weight: Weight: 70.8kg. Weight: 155.7lb. Body mass index: BMI: 23kg/m^2. Body surface area: BSA: 1.71m^2. Blood pressure: 122/70. Patient status: Outpatient. Location: [place] [facility]  ------------------------------------------------------------  ------------------------------------------------------------ Left ventricle: The cavity size was normal. Wall thickness was normal. Systolic function was normal. The estimated ejection fraction was in the range of 50% to 55%. Wall motion was normal; there were no regional wall motion abnormalities.  ------------------------------------------------------------ Aortic valve: Trileaflet; normal thickness leaflets. Mobility was not restricted. Doppler: Transvalvular velocity was within the normal range. There was no stenosis. No regurgitation.  ------------------------------------------------------------ Aorta: Aortic root: The aortic root was normal in size.  ------------------------------------------------------------ Mitral valve: A mechanical prosthesis was present. Mobility was not restricted. Doppler: Transvalvular velocity was within the normal range. There was no evidence for stenosis. Trivial regurgitation. Indexed valve area by continuity equation (using LVOT flow): 1.01cm^2/m^2. Mean gradient: 2mm Hg (D). Peak gradient: 5mm Hg (D).  ------------------------------------------------------------ Left atrium: The atrium was normal in size.  ------------------------------------------------------------ Right ventricle: The cavity size was normal. Systolic function was normal.  ------------------------------------------------------------ Pulmonic valve: Doppler: Transvalvular velocity was within the normal range. There was no evidence for stenosis. Mild  regurgitation.  ------------------------------------------------------------ Tricuspid valve: Structurally normal valve. Doppler: Transvalvular velocity was within the normal range. Mild regurgitation.  ------------------------------------------------------------ Pulmonary artery: Systolic pressure was within the normal range.  ------------------------------------------------------------ Right atrium: The atrium was normal in size.  ------------------------------------------------------------ Pericardium: There was no pericardial effusion.  ------------------------------------------------------------  2D measurements Normal Doppler measurements Norma Left ventricle l LVID ED, 41.4 mm 43-52 Main pulmonary artery chord, Pressure, 29 mm Hg =30 PLAX S LVID ES, 27.1 mm 23-38 Left ventricle chord, Ea, lat 8.2 cm/s ----- PLAX ann, tiss 9 FS, chord, 35 % >29 DP PLAX E/Ea, lat 13. ----- LVPW, ED 10.8 mm ------ ann, tiss 75 IVS/LVPW 1.01 <1.3 DP ratio, ED Ea, med 3.9 cm/s ----- Ventricular septum ann, tiss IVS, ED 10.9 mm ------ DP LVOT E/Ea, med 29. ----- Diam, S 19 mm ------ ann, tiss 23 Area 2.84 cm^2 ------ DP Diam 19 mm ------ LVOT Aorta Peak vel, 92. cm/s ----- Root diam, 26 mm ------ S 5 ED VTI, S 15. cm ----- Left atrium 5 AP dim 31 mm ------ HR 78 bpm ----- AP dim 1.67 cm/m^2 <2.2 Stroke vol 43. ml ----- index 9 Cardiac 3.4 L/min ----- output Cardiac 1.8 L/(min-m ----- index ^2) Stroke 23. ml/m^2 ----- index 6 Mitral valve Peak E vel 114 cm/s ----- Peak A vel 49. cm/s ----- 9 Mean vel, 67. cm/s ----- D 3 Decelerati 215 ms 150-2 on time 30 Mean 2 mm Hg ----- gradient, D Peak 5 mm Hg ----- gradient, D Peak E/A 2.3 ----- ratio Area index 1.0 cm^2/m^2 ----- (LVOT 1 cont) Annulus 23. cm ----- VTI 6  Tricuspid valve Regurg 245 cm/s ----- peak vel Peak RV-RA 24 mm Hg ----- gradient, S Systemic veins Estimated 5 mm Hg ----- CVP Right  ventricle Pressure, 29 mm Hg <30 S Sa vel, 10. cm/s ----- lat ann, 7 tiss DP  ------------------------------------------------------------ Prepared and Electronically Authenticated by  Olga Millers 2014-01-14T15:58:19.333   Impression:  Patient is doing very well more than 1 year following minimally invasive mitral valve replacement and Maze procedure. She is maintaining sinus rhythm. Late followup echocardiogram looks quite good with normal left ventricular function.  Plan:  We will have the patient return in one year's time for routine followup and rhythm check.   Salvatore Decent. Cornelius Moras, MD 03/27/2013 4:56 PM

## 2013-03-28 ENCOUNTER — Other Ambulatory Visit: Payer: Self-pay

## 2013-03-28 DIAGNOSIS — I10 Essential (primary) hypertension: Secondary | ICD-10-CM

## 2013-03-29 ENCOUNTER — Encounter (HOSPITAL_COMMUNITY): Payer: Self-pay

## 2013-03-31 ENCOUNTER — Encounter (HOSPITAL_COMMUNITY)
Admission: RE | Admit: 2013-03-31 | Discharge: 2013-03-31 | Disposition: A | Discharge status: Home / Self Care | Payer: Self-pay | Source: Ambulatory Visit | Attending: Cardiology | Admitting: Cardiology

## 2013-04-03 ENCOUNTER — Encounter (HOSPITAL_COMMUNITY)
Admission: RE | Admit: 2013-04-03 | Discharge: 2013-04-03 | Disposition: A | Discharge status: Home / Self Care | Payer: Self-pay | Source: Ambulatory Visit | Attending: Cardiology | Admitting: Cardiology

## 2013-04-05 ENCOUNTER — Encounter (HOSPITAL_COMMUNITY): Admission: RE | Admit: 2013-04-05 | Payer: Self-pay | Source: Ambulatory Visit

## 2013-04-07 ENCOUNTER — Encounter (HOSPITAL_COMMUNITY)
Admission: RE | Admit: 2013-04-07 | Discharge: 2013-04-07 | Disposition: A | Discharge status: Home / Self Care | Payer: Self-pay | Source: Ambulatory Visit | Attending: Cardiology | Admitting: Cardiology

## 2013-04-10 ENCOUNTER — Encounter (HOSPITAL_COMMUNITY): Payer: Self-pay

## 2013-04-12 ENCOUNTER — Encounter (HOSPITAL_COMMUNITY): Payer: Self-pay

## 2013-04-12 DIAGNOSIS — S52599A Other fractures of lower end of unspecified radius, initial encounter for closed fracture: Secondary | ICD-10-CM | POA: Diagnosis not present

## 2013-04-14 ENCOUNTER — Encounter (HOSPITAL_COMMUNITY)
Admission: RE | Admit: 2013-04-14 | Discharge: 2013-04-14 | Disposition: A | Discharge status: Home / Self Care | Payer: Self-pay | Source: Ambulatory Visit | Attending: Cardiology | Admitting: Cardiology

## 2013-04-14 ENCOUNTER — Ambulatory Visit (INDEPENDENT_AMBULATORY_CARE_PROVIDER_SITE_OTHER): Payer: Medicare Other

## 2013-04-14 DIAGNOSIS — Z952 Presence of prosthetic heart valve: Secondary | ICD-10-CM

## 2013-04-14 DIAGNOSIS — Z954 Presence of other heart-valve replacement: Secondary | ICD-10-CM | POA: Diagnosis not present

## 2013-04-14 DIAGNOSIS — Z7901 Long term (current) use of anticoagulants: Secondary | ICD-10-CM | POA: Diagnosis not present

## 2013-04-14 DIAGNOSIS — Z9889 Other specified postprocedural states: Secondary | ICD-10-CM

## 2013-04-14 DIAGNOSIS — Z8679 Personal history of other diseases of the circulatory system: Secondary | ICD-10-CM

## 2013-04-14 DIAGNOSIS — I4891 Unspecified atrial fibrillation: Secondary | ICD-10-CM | POA: Diagnosis not present

## 2013-04-17 ENCOUNTER — Encounter (HOSPITAL_COMMUNITY): Payer: Self-pay

## 2013-04-19 ENCOUNTER — Encounter (HOSPITAL_COMMUNITY)
Admission: RE | Admit: 2013-04-19 | Discharge: 2013-04-19 | Disposition: A | Discharge status: Home / Self Care | Payer: Self-pay | Source: Ambulatory Visit | Attending: Cardiology | Admitting: Cardiology

## 2013-04-19 DIAGNOSIS — G35 Multiple sclerosis: Secondary | ICD-10-CM | POA: Insufficient documentation

## 2013-04-19 DIAGNOSIS — Z7901 Long term (current) use of anticoagulants: Secondary | ICD-10-CM | POA: Insufficient documentation

## 2013-04-19 DIAGNOSIS — I4891 Unspecified atrial fibrillation: Secondary | ICD-10-CM | POA: Insufficient documentation

## 2013-04-19 DIAGNOSIS — M899 Disorder of bone, unspecified: Secondary | ICD-10-CM | POA: Insufficient documentation

## 2013-04-19 DIAGNOSIS — Z79899 Other long term (current) drug therapy: Secondary | ICD-10-CM | POA: Insufficient documentation

## 2013-04-19 DIAGNOSIS — E039 Hypothyroidism, unspecified: Secondary | ICD-10-CM | POA: Insufficient documentation

## 2013-04-19 DIAGNOSIS — M949 Disorder of cartilage, unspecified: Secondary | ICD-10-CM | POA: Insufficient documentation

## 2013-04-19 DIAGNOSIS — Z853 Personal history of malignant neoplasm of breast: Secondary | ICD-10-CM | POA: Insufficient documentation

## 2013-04-19 DIAGNOSIS — I2789 Other specified pulmonary heart diseases: Secondary | ICD-10-CM | POA: Insufficient documentation

## 2013-04-19 DIAGNOSIS — Z5189 Encounter for other specified aftercare: Secondary | ICD-10-CM | POA: Insufficient documentation

## 2013-04-19 DIAGNOSIS — F411 Generalized anxiety disorder: Secondary | ICD-10-CM | POA: Insufficient documentation

## 2013-04-19 DIAGNOSIS — R000 Tachycardia, unspecified: Secondary | ICD-10-CM | POA: Insufficient documentation

## 2013-04-19 DIAGNOSIS — Z881 Allergy status to other antibiotic agents status: Secondary | ICD-10-CM | POA: Insufficient documentation

## 2013-04-19 DIAGNOSIS — I059 Rheumatic mitral valve disease, unspecified: Secondary | ICD-10-CM | POA: Insufficient documentation

## 2013-04-19 DIAGNOSIS — I440 Atrioventricular block, first degree: Secondary | ICD-10-CM | POA: Insufficient documentation

## 2013-04-19 DIAGNOSIS — I509 Heart failure, unspecified: Secondary | ICD-10-CM | POA: Insufficient documentation

## 2013-04-19 DIAGNOSIS — Z9889 Other specified postprocedural states: Secondary | ICD-10-CM | POA: Insufficient documentation

## 2013-04-24 ENCOUNTER — Encounter (HOSPITAL_COMMUNITY)
Admission: RE | Admit: 2013-04-24 | Discharge: 2013-04-24 | Disposition: A | Discharge status: Home / Self Care | Payer: Self-pay | Source: Ambulatory Visit | Attending: Cardiology | Admitting: Cardiology

## 2013-04-26 ENCOUNTER — Encounter (HOSPITAL_COMMUNITY): Payer: Self-pay

## 2013-04-28 ENCOUNTER — Encounter (HOSPITAL_COMMUNITY)
Admission: RE | Admit: 2013-04-28 | Discharge: 2013-04-28 | Disposition: A | Discharge status: Home / Self Care | Payer: Self-pay | Source: Ambulatory Visit | Attending: Cardiology | Admitting: Cardiology

## 2013-05-01 ENCOUNTER — Encounter (HOSPITAL_COMMUNITY)
Admission: RE | Admit: 2013-05-01 | Discharge: 2013-05-01 | Disposition: A | Discharge status: Home / Self Care | Payer: Self-pay | Source: Ambulatory Visit | Attending: Cardiology | Admitting: Cardiology

## 2013-05-03 ENCOUNTER — Encounter (HOSPITAL_COMMUNITY): Payer: Self-pay

## 2013-05-05 ENCOUNTER — Encounter (HOSPITAL_COMMUNITY)
Admission: RE | Admit: 2013-05-05 | Discharge: 2013-05-05 | Disposition: A | Discharge status: Home / Self Care | Payer: Self-pay | Source: Ambulatory Visit | Attending: Cardiology | Admitting: Cardiology

## 2013-05-08 ENCOUNTER — Encounter (HOSPITAL_COMMUNITY)
Admission: RE | Admit: 2013-05-08 | Discharge: 2013-05-08 | Disposition: A | Discharge status: Home / Self Care | Payer: Self-pay | Source: Ambulatory Visit | Attending: Cardiology | Admitting: Cardiology

## 2013-05-09 DIAGNOSIS — G35 Multiple sclerosis: Secondary | ICD-10-CM | POA: Diagnosis not present

## 2013-05-10 ENCOUNTER — Encounter (HOSPITAL_COMMUNITY): Payer: Self-pay

## 2013-05-11 DIAGNOSIS — Z9189 Other specified personal risk factors, not elsewhere classified: Secondary | ICD-10-CM | POA: Diagnosis not present

## 2013-05-11 DIAGNOSIS — Z01419 Encounter for gynecological examination (general) (routine) without abnormal findings: Secondary | ICD-10-CM | POA: Diagnosis not present

## 2013-05-12 ENCOUNTER — Ambulatory Visit (INDEPENDENT_AMBULATORY_CARE_PROVIDER_SITE_OTHER): Payer: Medicare Other | Admitting: *Deleted

## 2013-05-12 ENCOUNTER — Encounter (HOSPITAL_COMMUNITY)
Admission: RE | Admit: 2013-05-12 | Discharge: 2013-05-12 | Disposition: A | Discharge status: Home / Self Care | Payer: Self-pay | Source: Ambulatory Visit | Attending: Cardiology | Admitting: Cardiology

## 2013-05-12 DIAGNOSIS — Z7901 Long term (current) use of anticoagulants: Secondary | ICD-10-CM

## 2013-05-12 DIAGNOSIS — Z952 Presence of prosthetic heart valve: Secondary | ICD-10-CM

## 2013-05-12 DIAGNOSIS — Z9889 Other specified postprocedural states: Secondary | ICD-10-CM | POA: Diagnosis not present

## 2013-05-12 DIAGNOSIS — Z8679 Personal history of other diseases of the circulatory system: Secondary | ICD-10-CM

## 2013-05-12 DIAGNOSIS — I4891 Unspecified atrial fibrillation: Secondary | ICD-10-CM

## 2013-05-12 DIAGNOSIS — Z954 Presence of other heart-valve replacement: Secondary | ICD-10-CM | POA: Diagnosis not present

## 2013-05-12 LAB — POCT INR: INR: 2.7

## 2013-05-15 ENCOUNTER — Encounter (HOSPITAL_COMMUNITY)
Admission: RE | Admit: 2013-05-15 | Discharge: 2013-05-15 | Disposition: A | Discharge status: Home / Self Care | Payer: Self-pay | Source: Ambulatory Visit | Attending: Cardiology | Admitting: Cardiology

## 2013-05-17 ENCOUNTER — Encounter (HOSPITAL_COMMUNITY): Payer: Self-pay

## 2013-05-19 ENCOUNTER — Encounter (HOSPITAL_COMMUNITY)
Admission: RE | Admit: 2013-05-19 | Discharge: 2013-05-19 | Disposition: A | Discharge status: Home / Self Care | Payer: Medicare Other | Source: Ambulatory Visit | Attending: Cardiology | Admitting: Cardiology

## 2013-05-19 DIAGNOSIS — Z79899 Other long term (current) drug therapy: Secondary | ICD-10-CM | POA: Insufficient documentation

## 2013-05-19 DIAGNOSIS — I059 Rheumatic mitral valve disease, unspecified: Secondary | ICD-10-CM | POA: Insufficient documentation

## 2013-05-19 DIAGNOSIS — F411 Generalized anxiety disorder: Secondary | ICD-10-CM | POA: Insufficient documentation

## 2013-05-19 DIAGNOSIS — Z881 Allergy status to other antibiotic agents status: Secondary | ICD-10-CM | POA: Insufficient documentation

## 2013-05-19 DIAGNOSIS — Z5189 Encounter for other specified aftercare: Secondary | ICD-10-CM | POA: Insufficient documentation

## 2013-05-19 DIAGNOSIS — I440 Atrioventricular block, first degree: Secondary | ICD-10-CM | POA: Insufficient documentation

## 2013-05-19 DIAGNOSIS — I4891 Unspecified atrial fibrillation: Secondary | ICD-10-CM | POA: Insufficient documentation

## 2013-05-19 DIAGNOSIS — Z9889 Other specified postprocedural states: Secondary | ICD-10-CM | POA: Insufficient documentation

## 2013-05-19 DIAGNOSIS — Z7901 Long term (current) use of anticoagulants: Secondary | ICD-10-CM | POA: Insufficient documentation

## 2013-05-19 DIAGNOSIS — I509 Heart failure, unspecified: Secondary | ICD-10-CM | POA: Insufficient documentation

## 2013-05-19 DIAGNOSIS — M899 Disorder of bone, unspecified: Secondary | ICD-10-CM | POA: Insufficient documentation

## 2013-05-19 DIAGNOSIS — I2789 Other specified pulmonary heart diseases: Secondary | ICD-10-CM | POA: Insufficient documentation

## 2013-05-19 DIAGNOSIS — M949 Disorder of cartilage, unspecified: Secondary | ICD-10-CM | POA: Insufficient documentation

## 2013-05-19 DIAGNOSIS — E039 Hypothyroidism, unspecified: Secondary | ICD-10-CM | POA: Insufficient documentation

## 2013-05-19 DIAGNOSIS — R000 Tachycardia, unspecified: Secondary | ICD-10-CM | POA: Insufficient documentation

## 2013-05-19 DIAGNOSIS — Z853 Personal history of malignant neoplasm of breast: Secondary | ICD-10-CM | POA: Insufficient documentation

## 2013-05-19 DIAGNOSIS — G35 Multiple sclerosis: Secondary | ICD-10-CM | POA: Insufficient documentation

## 2013-05-19 NOTE — Progress Notes (Signed)
[name] [name] had an episode of irritable bowel syndrome on the way into cardiac rehab today.  After exercising for approximately 5 minutes she experienced dizziness.  Blood pressure 122/60, heart rate 101. Exercise stopped,  given 12 oz. of gatorade and symptoms resolved.  Instructed not to exercise today.

## 2013-05-22 ENCOUNTER — Encounter (HOSPITAL_COMMUNITY)
Admission: RE | Admit: 2013-05-22 | Discharge: 2013-05-22 | Disposition: A | Discharge status: Home / Self Care | Payer: Medicare Other | Source: Ambulatory Visit | Attending: Cardiology | Admitting: Cardiology

## 2013-05-24 ENCOUNTER — Encounter (HOSPITAL_COMMUNITY): Payer: Medicare Other

## 2013-05-26 ENCOUNTER — Other Ambulatory Visit: Payer: Self-pay

## 2013-05-26 ENCOUNTER — Encounter (HOSPITAL_COMMUNITY)
Admission: RE | Admit: 2013-05-26 | Discharge: 2013-05-26 | Disposition: A | Discharge status: Home / Self Care | Payer: Medicare Other | Source: Ambulatory Visit | Attending: Cardiology | Admitting: Cardiology

## 2013-05-26 DIAGNOSIS — I4891 Unspecified atrial fibrillation: Secondary | ICD-10-CM

## 2013-05-26 DIAGNOSIS — Z8679 Personal history of other diseases of the circulatory system: Secondary | ICD-10-CM

## 2013-05-26 DIAGNOSIS — Z7901 Long term (current) use of anticoagulants: Secondary | ICD-10-CM

## 2013-05-26 DIAGNOSIS — Z952 Presence of prosthetic heart valve: Secondary | ICD-10-CM

## 2013-05-26 NOTE — Telephone Encounter (Signed)
Pt needs a refill on Warfarin and I can not refill it for them.

## 2013-05-29 ENCOUNTER — Encounter (HOSPITAL_COMMUNITY)
Admission: RE | Admit: 2013-05-29 | Discharge: 2013-05-29 | Disposition: A | Discharge status: Home / Self Care | Payer: Medicare Other | Source: Ambulatory Visit | Attending: Cardiology | Admitting: Cardiology

## 2013-05-29 MED ORDER — WARFARIN SODIUM 5 MG PO TABS: ORAL_TABLET | ORAL | Status: DC

## 2013-05-30 DIAGNOSIS — Z853 Personal history of malignant neoplasm of breast: Secondary | ICD-10-CM | POA: Diagnosis not present

## 2013-05-30 DIAGNOSIS — N6459 Other signs and symptoms in breast: Secondary | ICD-10-CM | POA: Diagnosis not present

## 2013-05-31 ENCOUNTER — Encounter (HOSPITAL_COMMUNITY): Payer: Medicare Other

## 2013-06-02 ENCOUNTER — Encounter (HOSPITAL_COMMUNITY)
Admission: RE | Admit: 2013-06-02 | Discharge: 2013-06-02 | Disposition: A | Discharge status: Home / Self Care | Payer: Medicare Other | Source: Ambulatory Visit | Attending: Cardiology | Admitting: Cardiology

## 2013-06-05 ENCOUNTER — Encounter (HOSPITAL_COMMUNITY)
Admission: RE | Admit: 2013-06-05 | Discharge: 2013-06-05 | Disposition: A | Discharge status: Home / Self Care | Payer: Medicare Other | Source: Ambulatory Visit | Attending: Cardiology | Admitting: Cardiology

## 2013-06-07 ENCOUNTER — Encounter (HOSPITAL_COMMUNITY): Payer: Medicare Other

## 2013-06-09 ENCOUNTER — Encounter (HOSPITAL_COMMUNITY): Payer: Medicare Other

## 2013-06-12 ENCOUNTER — Encounter: Payer: Self-pay | Admitting: Cardiology

## 2013-06-12 ENCOUNTER — Encounter (HOSPITAL_COMMUNITY)
Admission: RE | Admit: 2013-06-12 | Discharge: 2013-06-12 | Disposition: A | Discharge status: Home / Self Care | Payer: Medicare Other | Source: Ambulatory Visit | Attending: Cardiology | Admitting: Cardiology

## 2013-06-14 ENCOUNTER — Encounter (HOSPITAL_COMMUNITY): Payer: Medicare Other

## 2013-06-16 ENCOUNTER — Ambulatory Visit (INDEPENDENT_AMBULATORY_CARE_PROVIDER_SITE_OTHER): Payer: Medicare Other

## 2013-06-16 ENCOUNTER — Encounter (HOSPITAL_COMMUNITY)
Admission: RE | Admit: 2013-06-16 | Discharge: 2013-06-16 | Disposition: A | Discharge status: Home / Self Care | Payer: Medicare Other | Source: Ambulatory Visit | Attending: Cardiology | Admitting: Cardiology

## 2013-06-16 DIAGNOSIS — Z7901 Long term (current) use of anticoagulants: Secondary | ICD-10-CM | POA: Diagnosis not present

## 2013-06-16 DIAGNOSIS — I509 Heart failure, unspecified: Secondary | ICD-10-CM

## 2013-06-16 DIAGNOSIS — Z8679 Personal history of other diseases of the circulatory system: Secondary | ICD-10-CM

## 2013-06-16 DIAGNOSIS — Z9889 Other specified postprocedural states: Secondary | ICD-10-CM

## 2013-06-16 DIAGNOSIS — Z952 Presence of prosthetic heart valve: Secondary | ICD-10-CM

## 2013-06-16 DIAGNOSIS — Z954 Presence of other heart-valve replacement: Secondary | ICD-10-CM

## 2013-06-16 DIAGNOSIS — I4891 Unspecified atrial fibrillation: Secondary | ICD-10-CM | POA: Diagnosis not present

## 2013-06-16 LAB — POCT INR: INR: 3.1

## 2013-06-21 ENCOUNTER — Encounter (HOSPITAL_COMMUNITY)
Admission: RE | Admit: 2013-06-21 | Discharge: 2013-06-21 | Disposition: A | Discharge status: Home / Self Care | Payer: Self-pay | Source: Ambulatory Visit | Attending: Cardiology | Admitting: Cardiology

## 2013-06-21 DIAGNOSIS — I059 Rheumatic mitral valve disease, unspecified: Secondary | ICD-10-CM | POA: Insufficient documentation

## 2013-06-21 DIAGNOSIS — Z5189 Encounter for other specified aftercare: Secondary | ICD-10-CM | POA: Insufficient documentation

## 2013-06-21 DIAGNOSIS — I4891 Unspecified atrial fibrillation: Secondary | ICD-10-CM | POA: Insufficient documentation

## 2013-06-21 DIAGNOSIS — I2789 Other specified pulmonary heart diseases: Secondary | ICD-10-CM | POA: Insufficient documentation

## 2013-06-23 ENCOUNTER — Encounter (HOSPITAL_COMMUNITY): Payer: Self-pay

## 2013-06-26 ENCOUNTER — Encounter (HOSPITAL_COMMUNITY)
Admission: RE | Admit: 2013-06-26 | Discharge: 2013-06-26 | Disposition: A | Discharge status: Home / Self Care | Payer: Self-pay | Source: Ambulatory Visit | Attending: Cardiology | Admitting: Cardiology

## 2013-06-28 ENCOUNTER — Encounter (HOSPITAL_COMMUNITY): Payer: Self-pay

## 2013-06-28 DIAGNOSIS — G35 Multiple sclerosis: Secondary | ICD-10-CM | POA: Diagnosis not present

## 2013-06-30 ENCOUNTER — Encounter (HOSPITAL_COMMUNITY): Payer: Self-pay

## 2013-07-03 ENCOUNTER — Encounter (HOSPITAL_COMMUNITY): Payer: Self-pay

## 2013-07-03 ENCOUNTER — Telehealth: Payer: Self-pay | Admitting: Cardiology

## 2013-07-03 NOTE — Telephone Encounter (Signed)
New problem  [facility] pt of Kat[facility] takes Coumadin//[facility] wants to know if she can come off 3-5 days for Biopsy.    Pt is scheduled next week for the 24 th for this procedure.

## 2013-07-03 NOTE — Telephone Encounter (Signed)
**Note De-Identified [name] Obfuscation** Pt was last seen in office by Dr Myrtis Ser on 09/29/12 and was to f/u in June but did not. Will she need to f/u before procedure? Please advise.

## 2013-07-05 ENCOUNTER — Encounter (HOSPITAL_COMMUNITY): Payer: Self-pay

## 2013-07-05 NOTE — Telephone Encounter (Signed)
The patient has a mechanical mitral prosthesis. She will have to be bridged anytime her Coumadin is held. Please talk with Kennon Rounds in the Coumadin clinic to arrange this

## 2013-07-07 ENCOUNTER — Encounter (HOSPITAL_COMMUNITY)
Admission: RE | Admit: 2013-07-07 | Discharge: 2013-07-07 | Disposition: A | Discharge status: Home / Self Care | Payer: Self-pay | Source: Ambulatory Visit | Attending: Cardiology | Admitting: Cardiology

## 2013-07-07 NOTE — Telephone Encounter (Signed)
Per Kennon Rounds, pharmacist, Shanda Bumps is advised that the pt does not need to stop Coumadin before biopsy. Shanda Bumps verbalized understanding.

## 2013-07-10 ENCOUNTER — Encounter (HOSPITAL_COMMUNITY)
Admission: RE | Admit: 2013-07-10 | Discharge: 2013-07-10 | Disposition: A | Discharge status: Home / Self Care | Payer: Self-pay | Source: Ambulatory Visit | Attending: Cardiology | Admitting: Cardiology

## 2013-07-12 ENCOUNTER — Other Ambulatory Visit: Payer: Self-pay | Admitting: Radiology

## 2013-07-12 ENCOUNTER — Encounter (HOSPITAL_COMMUNITY): Payer: Self-pay

## 2013-07-12 DIAGNOSIS — N641 Fat necrosis of breast: Secondary | ICD-10-CM | POA: Diagnosis not present

## 2013-07-14 ENCOUNTER — Encounter (HOSPITAL_COMMUNITY)
Admission: RE | Admit: 2013-07-14 | Discharge: 2013-07-14 | Disposition: A | Discharge status: Home / Self Care | Payer: Self-pay | Source: Ambulatory Visit | Attending: Cardiology | Admitting: Cardiology

## 2013-07-17 ENCOUNTER — Encounter (HOSPITAL_COMMUNITY)
Admission: RE | Admit: 2013-07-17 | Discharge: 2013-07-17 | Disposition: A | Discharge status: Home / Self Care | Payer: Self-pay | Source: Ambulatory Visit | Attending: Cardiology | Admitting: Cardiology

## 2013-07-18 ENCOUNTER — Other Ambulatory Visit: Payer: Self-pay

## 2013-07-18 MED ORDER — METOPROLOL TARTRATE 25 MG PO TABS: 25.0000 mg | ORAL_TABLET | Freq: Two times a day (BID) | ORAL | Status: DC

## 2013-07-19 ENCOUNTER — Encounter (HOSPITAL_COMMUNITY): Payer: Medicare Other

## 2013-07-19 DIAGNOSIS — I059 Rheumatic mitral valve disease, unspecified: Secondary | ICD-10-CM | POA: Insufficient documentation

## 2013-07-19 DIAGNOSIS — Z5189 Encounter for other specified aftercare: Secondary | ICD-10-CM | POA: Insufficient documentation

## 2013-07-19 DIAGNOSIS — I4891 Unspecified atrial fibrillation: Secondary | ICD-10-CM | POA: Insufficient documentation

## 2013-07-19 DIAGNOSIS — I2789 Other specified pulmonary heart diseases: Secondary | ICD-10-CM | POA: Insufficient documentation

## 2013-07-21 ENCOUNTER — Encounter (HOSPITAL_COMMUNITY)
Admission: RE | Admit: 2013-07-21 | Discharge: 2013-07-21 | Disposition: A | Discharge status: Home / Self Care | Payer: Self-pay | Source: Ambulatory Visit | Attending: Cardiology | Admitting: Cardiology

## 2013-07-24 ENCOUNTER — Encounter (HOSPITAL_COMMUNITY)
Admission: RE | Admit: 2013-07-24 | Discharge: 2013-07-24 | Disposition: A | Discharge status: Home / Self Care | Payer: Self-pay | Source: Ambulatory Visit | Attending: Cardiology | Admitting: Cardiology

## 2013-07-26 ENCOUNTER — Encounter (HOSPITAL_COMMUNITY): Payer: Medicare Other

## 2013-07-28 ENCOUNTER — Encounter (HOSPITAL_COMMUNITY)
Admission: RE | Admit: 2013-07-28 | Discharge: 2013-07-28 | Disposition: A | Discharge status: Home / Self Care | Payer: Self-pay | Source: Ambulatory Visit | Attending: Cardiology | Admitting: Cardiology

## 2013-07-28 ENCOUNTER — Ambulatory Visit (INDEPENDENT_AMBULATORY_CARE_PROVIDER_SITE_OTHER): Payer: Medicare Other | Admitting: *Deleted

## 2013-07-28 DIAGNOSIS — Z9889 Other specified postprocedural states: Secondary | ICD-10-CM

## 2013-07-28 DIAGNOSIS — I4891 Unspecified atrial fibrillation: Secondary | ICD-10-CM

## 2013-07-28 DIAGNOSIS — Z7901 Long term (current) use of anticoagulants: Secondary | ICD-10-CM

## 2013-07-28 DIAGNOSIS — I509 Heart failure, unspecified: Secondary | ICD-10-CM

## 2013-07-28 DIAGNOSIS — Z954 Presence of other heart-valve replacement: Secondary | ICD-10-CM

## 2013-07-28 DIAGNOSIS — Z952 Presence of prosthetic heart valve: Secondary | ICD-10-CM

## 2013-07-28 DIAGNOSIS — Z8679 Personal history of other diseases of the circulatory system: Secondary | ICD-10-CM

## 2013-07-28 LAB — POCT INR: INR: 1.5

## 2013-07-31 ENCOUNTER — Encounter (HOSPITAL_COMMUNITY): Payer: Medicare Other

## 2013-08-02 ENCOUNTER — Encounter (HOSPITAL_COMMUNITY): Payer: Medicare Other

## 2013-08-04 ENCOUNTER — Ambulatory Visit (INDEPENDENT_AMBULATORY_CARE_PROVIDER_SITE_OTHER): Payer: Medicare Other | Admitting: *Deleted

## 2013-08-04 ENCOUNTER — Encounter (HOSPITAL_COMMUNITY)
Admission: RE | Admit: 2013-08-04 | Discharge: 2013-08-04 | Disposition: A | Discharge status: Home / Self Care | Payer: Self-pay | Source: Ambulatory Visit | Attending: Cardiology | Admitting: Cardiology

## 2013-08-04 DIAGNOSIS — Z954 Presence of other heart-valve replacement: Secondary | ICD-10-CM | POA: Diagnosis not present

## 2013-08-04 DIAGNOSIS — I509 Heart failure, unspecified: Secondary | ICD-10-CM

## 2013-08-04 DIAGNOSIS — Z9889 Other specified postprocedural states: Secondary | ICD-10-CM

## 2013-08-04 DIAGNOSIS — Z8679 Personal history of other diseases of the circulatory system: Secondary | ICD-10-CM

## 2013-08-04 DIAGNOSIS — Z7901 Long term (current) use of anticoagulants: Secondary | ICD-10-CM

## 2013-08-04 DIAGNOSIS — I4891 Unspecified atrial fibrillation: Secondary | ICD-10-CM

## 2013-08-04 DIAGNOSIS — Z952 Presence of prosthetic heart valve: Secondary | ICD-10-CM

## 2013-08-07 ENCOUNTER — Encounter (HOSPITAL_COMMUNITY)
Admission: RE | Admit: 2013-08-07 | Discharge: 2013-08-07 | Disposition: A | Discharge status: Home / Self Care | Payer: Self-pay | Source: Ambulatory Visit | Attending: Cardiology | Admitting: Cardiology

## 2013-08-09 ENCOUNTER — Encounter (HOSPITAL_COMMUNITY): Payer: Medicare Other

## 2013-08-11 ENCOUNTER — Encounter: Payer: Self-pay | Admitting: Cardiology

## 2013-08-11 ENCOUNTER — Encounter (HOSPITAL_COMMUNITY)
Admission: RE | Admit: 2013-08-11 | Discharge: 2013-08-11 | Disposition: A | Discharge status: Home / Self Care | Payer: Self-pay | Source: Ambulatory Visit | Attending: Cardiology | Admitting: Cardiology

## 2013-08-14 ENCOUNTER — Encounter (HOSPITAL_COMMUNITY)
Admission: RE | Admit: 2013-08-14 | Discharge: 2013-08-14 | Disposition: A | Discharge status: Home / Self Care | Payer: Self-pay | Source: Ambulatory Visit | Attending: Cardiology | Admitting: Cardiology

## 2013-08-16 ENCOUNTER — Encounter (HOSPITAL_COMMUNITY): Payer: Medicare Other

## 2013-08-17 DIAGNOSIS — G35 Multiple sclerosis: Secondary | ICD-10-CM | POA: Diagnosis not present

## 2013-08-18 ENCOUNTER — Encounter (HOSPITAL_COMMUNITY)
Admission: RE | Admit: 2013-08-18 | Discharge: 2013-08-18 | Disposition: A | Discharge status: Home / Self Care | Payer: Self-pay | Source: Ambulatory Visit | Attending: Cardiology | Admitting: Cardiology

## 2013-08-21 ENCOUNTER — Encounter (HOSPITAL_COMMUNITY)
Admission: RE | Admit: 2013-08-21 | Discharge: 2013-08-21 | Disposition: A | Discharge status: Home / Self Care | Payer: Self-pay | Source: Ambulatory Visit | Attending: Cardiology | Admitting: Cardiology

## 2013-08-21 ENCOUNTER — Ambulatory Visit (INDEPENDENT_AMBULATORY_CARE_PROVIDER_SITE_OTHER): Payer: Medicare Other | Admitting: *Deleted

## 2013-08-21 DIAGNOSIS — Z8679 Personal history of other diseases of the circulatory system: Secondary | ICD-10-CM

## 2013-08-21 DIAGNOSIS — Z952 Presence of prosthetic heart valve: Secondary | ICD-10-CM

## 2013-08-21 DIAGNOSIS — Z954 Presence of other heart-valve replacement: Secondary | ICD-10-CM | POA: Diagnosis not present

## 2013-08-21 DIAGNOSIS — Z7901 Long term (current) use of anticoagulants: Secondary | ICD-10-CM | POA: Diagnosis not present

## 2013-08-21 DIAGNOSIS — I059 Rheumatic mitral valve disease, unspecified: Secondary | ICD-10-CM | POA: Insufficient documentation

## 2013-08-21 DIAGNOSIS — I4891 Unspecified atrial fibrillation: Secondary | ICD-10-CM

## 2013-08-21 DIAGNOSIS — I509 Heart failure, unspecified: Secondary | ICD-10-CM | POA: Diagnosis not present

## 2013-08-21 DIAGNOSIS — I2789 Other specified pulmonary heart diseases: Secondary | ICD-10-CM | POA: Insufficient documentation

## 2013-08-21 DIAGNOSIS — Z9889 Other specified postprocedural states: Secondary | ICD-10-CM

## 2013-08-21 DIAGNOSIS — Z5189 Encounter for other specified aftercare: Secondary | ICD-10-CM | POA: Insufficient documentation

## 2013-08-21 LAB — POCT INR: INR: 3.1

## 2013-08-23 ENCOUNTER — Encounter (HOSPITAL_COMMUNITY): Payer: Medicare Other

## 2013-08-25 ENCOUNTER — Encounter (HOSPITAL_COMMUNITY)
Admission: RE | Admit: 2013-08-25 | Discharge: 2013-08-25 | Disposition: A | Discharge status: Home / Self Care | Payer: Self-pay | Source: Ambulatory Visit | Attending: Cardiology | Admitting: Cardiology

## 2013-08-28 ENCOUNTER — Encounter (HOSPITAL_COMMUNITY)
Admission: RE | Admit: 2013-08-28 | Discharge: 2013-08-28 | Disposition: A | Discharge status: Home / Self Care | Payer: Self-pay | Source: Ambulatory Visit | Attending: Cardiology | Admitting: Cardiology

## 2013-08-30 ENCOUNTER — Encounter (HOSPITAL_COMMUNITY): Payer: Medicare Other

## 2013-09-01 ENCOUNTER — Encounter (HOSPITAL_COMMUNITY)
Admission: RE | Admit: 2013-09-01 | Discharge: 2013-09-01 | Disposition: A | Discharge status: Home / Self Care | Payer: Self-pay | Source: Ambulatory Visit | Attending: Cardiology | Admitting: Cardiology

## 2013-09-04 ENCOUNTER — Encounter (HOSPITAL_COMMUNITY)
Admission: RE | Admit: 2013-09-04 | Discharge: 2013-09-04 | Disposition: A | Discharge status: Home / Self Care | Payer: Self-pay | Source: Ambulatory Visit | Attending: Cardiology | Admitting: Cardiology

## 2013-09-04 ENCOUNTER — Ambulatory Visit (INDEPENDENT_AMBULATORY_CARE_PROVIDER_SITE_OTHER): Payer: Medicare Other | Admitting: *Deleted

## 2013-09-04 DIAGNOSIS — Z8679 Personal history of other diseases of the circulatory system: Secondary | ICD-10-CM

## 2013-09-04 DIAGNOSIS — I509 Heart failure, unspecified: Secondary | ICD-10-CM

## 2013-09-04 DIAGNOSIS — I4891 Unspecified atrial fibrillation: Secondary | ICD-10-CM | POA: Diagnosis not present

## 2013-09-04 DIAGNOSIS — Z7901 Long term (current) use of anticoagulants: Secondary | ICD-10-CM

## 2013-09-04 DIAGNOSIS — Z954 Presence of other heart-valve replacement: Secondary | ICD-10-CM | POA: Diagnosis not present

## 2013-09-04 DIAGNOSIS — Z9889 Other specified postprocedural states: Secondary | ICD-10-CM

## 2013-09-04 DIAGNOSIS — Z952 Presence of prosthetic heart valve: Secondary | ICD-10-CM

## 2013-09-04 LAB — POCT INR: INR: 2

## 2013-09-06 ENCOUNTER — Encounter (HOSPITAL_COMMUNITY): Payer: Medicare Other

## 2013-09-08 ENCOUNTER — Encounter (HOSPITAL_COMMUNITY)
Admission: RE | Admit: 2013-09-08 | Discharge: 2013-09-08 | Disposition: A | Discharge status: Home / Self Care | Payer: Self-pay | Source: Ambulatory Visit | Attending: Cardiology | Admitting: Cardiology

## 2013-09-11 ENCOUNTER — Encounter (HOSPITAL_COMMUNITY)
Admission: RE | Admit: 2013-09-11 | Discharge: 2013-09-11 | Disposition: A | Discharge status: Home / Self Care | Payer: Self-pay | Source: Ambulatory Visit | Attending: Cardiology | Admitting: Cardiology

## 2013-09-13 ENCOUNTER — Encounter (HOSPITAL_COMMUNITY)
Admission: RE | Admit: 2013-09-13 | Discharge: 2013-09-13 | Disposition: A | Discharge status: Home / Self Care | Payer: Self-pay | Source: Ambulatory Visit | Attending: Cardiology | Admitting: Cardiology

## 2013-09-18 ENCOUNTER — Ambulatory Visit (INDEPENDENT_AMBULATORY_CARE_PROVIDER_SITE_OTHER): Payer: Medicare Other | Admitting: Pharmacist

## 2013-09-18 ENCOUNTER — Encounter (HOSPITAL_COMMUNITY)
Admission: RE | Admit: 2013-09-18 | Discharge: 2013-09-18 | Disposition: A | Discharge status: Home / Self Care | Payer: Self-pay | Source: Ambulatory Visit | Attending: Cardiology | Admitting: Cardiology

## 2013-09-18 DIAGNOSIS — I509 Heart failure, unspecified: Secondary | ICD-10-CM | POA: Diagnosis not present

## 2013-09-18 DIAGNOSIS — I4891 Unspecified atrial fibrillation: Secondary | ICD-10-CM | POA: Diagnosis not present

## 2013-09-18 DIAGNOSIS — Z8679 Personal history of other diseases of the circulatory system: Secondary | ICD-10-CM

## 2013-09-18 DIAGNOSIS — I059 Rheumatic mitral valve disease, unspecified: Secondary | ICD-10-CM | POA: Insufficient documentation

## 2013-09-18 DIAGNOSIS — Z7901 Long term (current) use of anticoagulants: Secondary | ICD-10-CM | POA: Diagnosis not present

## 2013-09-18 DIAGNOSIS — Z9889 Other specified postprocedural states: Secondary | ICD-10-CM

## 2013-09-18 DIAGNOSIS — Z954 Presence of other heart-valve replacement: Secondary | ICD-10-CM | POA: Diagnosis not present

## 2013-09-18 DIAGNOSIS — Z5189 Encounter for other specified aftercare: Secondary | ICD-10-CM | POA: Insufficient documentation

## 2013-09-18 DIAGNOSIS — Z952 Presence of prosthetic heart valve: Secondary | ICD-10-CM

## 2013-09-18 DIAGNOSIS — I2789 Other specified pulmonary heart diseases: Secondary | ICD-10-CM | POA: Insufficient documentation

## 2013-09-18 MED ORDER — WARFARIN SODIUM 5 MG PO TABS: ORAL_TABLET | ORAL | Status: DC

## 2013-09-20 ENCOUNTER — Encounter: Payer: Self-pay | Admitting: Cardiology

## 2013-09-20 ENCOUNTER — Encounter (HOSPITAL_COMMUNITY): Payer: Medicare Other

## 2013-09-20 ENCOUNTER — Ambulatory Visit (INDEPENDENT_AMBULATORY_CARE_PROVIDER_SITE_OTHER): Payer: Medicare Other | Admitting: Cardiology

## 2013-09-20 VITALS — BP 118/76 | HR 75 | Ht 69.5 in | Wt 148.8 lb

## 2013-09-20 DIAGNOSIS — I059 Rheumatic mitral valve disease, unspecified: Secondary | ICD-10-CM

## 2013-09-20 DIAGNOSIS — R002 Palpitations: Secondary | ICD-10-CM | POA: Diagnosis not present

## 2013-09-20 DIAGNOSIS — R000 Tachycardia, unspecified: Secondary | ICD-10-CM

## 2013-09-20 DIAGNOSIS — Z9889 Other specified postprocedural states: Secondary | ICD-10-CM

## 2013-09-20 DIAGNOSIS — Z8679 Personal history of other diseases of the circulatory system: Secondary | ICD-10-CM

## 2013-09-20 DIAGNOSIS — I4891 Unspecified atrial fibrillation: Secondary | ICD-10-CM

## 2013-09-20 DIAGNOSIS — I272 Pulmonary hypertension, unspecified: Secondary | ICD-10-CM

## 2013-09-20 DIAGNOSIS — Z954 Presence of other heart-valve replacement: Secondary | ICD-10-CM

## 2013-09-20 DIAGNOSIS — I2789 Other specified pulmonary heart diseases: Secondary | ICD-10-CM

## 2013-09-20 DIAGNOSIS — I340 Nonrheumatic mitral (valve) insufficiency: Secondary | ICD-10-CM

## 2013-09-20 DIAGNOSIS — Z7901 Long term (current) use of anticoagulants: Secondary | ICD-10-CM

## 2013-09-20 DIAGNOSIS — Z952 Presence of prosthetic heart valve: Secondary | ICD-10-CM

## 2013-09-20 NOTE — Progress Notes (Signed)
HPI  Patient is seen today to followup mitral valve disease and atrial fibrillation. She is done extremely well. She has a mitral mechanical valve. Originally she presented with CHF. This resolved. Followup echo after her repair in January of 2014 revealed EF of 50-55%.  The patient notices some palpitations at nighttime. After further description it sounds like post PVC beats that she is feeling. I had a long discussion with her about this.  She's not having any shortness of breath or chest pain.  Allergies  Allergen Reactions  . Erythromycin Nausea And Vomiting    Current Outpatient Prescriptions  Medication Sig Dispense Refill  . Black Cohosh 40 MG CAPS Take 1 capsule by mouth daily.      . Bromelains (BROMELAIN PO) Take 1 capsule by mouth 2 (two) times daily.      . Cholecalciferol (VITAMIN D) 2000 UNITS CAPS Take 6,000 Units by mouth daily.      . Coenzyme Q10 (CO Q-10) 100 MG CAPS Take 1 capsule by mouth daily.       . Cranberry 500 MG CAPS Take 1 capsule by mouth daily.      Marland Kitchen L-THEANINE PO Take 1 capsule by mouth daily.       Marland Kitchen lactose free nutrition (BOOST PLUS) LIQD Take 237 mLs by mouth daily.       Marland Kitchen Lysine 500 MG CAPS Take 1 capsule by mouth daily.      . metoprolol tartrate (LOPRESSOR) 25 MG tablet Take 1 tablet (25 mg total) by mouth 2 (two) times daily.  60 tablet  6  . MILK THISTLE PO Take 1 capsule by mouth 2 (two) times daily.      Gracelyn Nurse Leaf 500 MG CAPS Take 1 capsule by mouth 2 (two) times daily.       Marland Kitchen OVER THE COUNTER MEDICATION Red marine algae - 2 capsules daily      . Probiotic Product (PROBIOTIC DAILY PO) Take 1 capsule once a day      . PROGESTERONE MICRONIZED PO Take 50 mg by mouth 2 (two) times daily.       Marland Kitchen thyroid (ARMOUR) 60 MG tablet Take 60 mg by mouth daily.      . vitamin A 16109 UNIT capsule Take 10,000 Units by mouth daily.      . vitamin E 400 UNIT capsule Take 400 Units by mouth daily.      Marland Kitchen warfarin (COUMADIN) 5 MG tablet Take as  directed by [facility]  45 tablet  3   No current facility-administered medications for this visit.    History   Social History  . Marital Status: Widowed    Spouse Name: N/A    Number of Children: N/A  . Years of Education: N/A   Occupational History  . Not on file.   Social History Main Topics  . Smoking status: Never Smoker   . Smokeless tobacco: Never Used  . Alcohol Use: No  . Drug Use: No  . Sexual Activity: Not on file   Other Topics Concern  . Not on file   Social History Narrative  . No narrative on file    Family History  Problem Relation Age of Onset  . CAD    . Heart disease Father     cardiac arrest   . CAD Mother     5 stents and numerous bypass surgery    Past Medical History  Diagnosis Date  . Multiple sclerosis   .  Breast cancer   . Ovarian cyst   . Hypothyroidism   . Mitral valve regurgitation     Mitral valve replacement April, 2013, Mitral valve prolapse  . CHF (congestive heart failure)     Related to severe mitral regurgitation, April, 2013  . Shortness of breath     Related to severe mitral regurgitation, April, 2013  . Paroxysmal atrial fibrillation     Rapid atrial fibrillation in-hospital, Rapid cardioversion,  before mitral valve surgery  . Neuromuscular disorder     ms  . Arthritis     knees  . 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 S/P Maze operation for atrial fibrillation 01/26/2012    Complete biatrial lesion set using cryothermy via right mini thoracotomy  . Pulmonary hypertension     Echo, April, 2013, before mitral valve surgery  . 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 Assess by pulmonary team in the hospital April, 2013  . Anxiety   . Neurogenic bladder   . Ejection fraction     EF 60%, echo, April, 2013, with severe MR before mitral valve replacement  . Warfarin anticoagulation      Mechanical mitral prosthesis, April, 45[address]    Past Surgical History  Procedure Laterality Date  . Breast lumpectomy    . Lymphadenectomy    . Wrist surgery    . Hernia repair    . Tonsillectomy    . Cystoscopy    . Laparoscopy    . Tee without cardioversion  01/19/2012    Procedure: TRANSESOPHAGEAL ECHOCARDIOGRAM (TEE);  Surgeon: Peter M Swaziland, MD;  Location: [facility] ENDOSCOPY;  Service: Cardiovascular;  Laterality: N/A;  . Maze  01/26/2012    Procedure: MAZE;  Surgeon: Purcell Nails, MD;  Location: [facility] OR;  Service: Open Heart Surgery;  Laterality: N/A;  . Mitral valve replacement  01/26/2012    Procedure: MINIMALLY INVASIVE MITRAL VALVE (MV) REPLACEMENT;  Surgeon: Purcell Nails, MD;  Location: [facility] OR;  Service: Open Heart Surgery;  Laterality: Right;  . Chest tube insertion  01/26/2012    Procedure: CHEST TUBE INSERTION;  Surgeon: Purcell Nails, MD;  Location: [facility] OR;  Service: Open Heart Surgery;  Laterality: Left;    Patient Active Problem List   Diagnosis Date Noted  . Palpitations 09/20/2013  . Mitral valve regurgitation   . Breast cancer   . Hypothyroidism   . CHF (congestive heart failure)   . Shortness of breath   . Atrial fibrillation   . Neuromuscular disorder   . Arthritis   . COPD (chronic obstructive pulmonary disease)   . Neurogenic bladder   . Ejection fraction   . Warfarin anticoagulation   . First degree heart block 01/29/2012  . 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 S/P Maze operation for atrial fibrillation 01/26/2012  . 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 Hypokalemia 01/19/2012  . Anxiety 01/18/2012  . Tachycardia 01/18/2012  . Pulmonary hypertension 01/18/2012  . Methylprednisolone-induced hypoxia 01/18/2012  . Multiple sclerosis 01/19/2007  . 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 OSTEOPENIA 01/19/2007  . URINARY INCONTINENCE 01/19/2007  . COLONOSCOPY, HX OF 01/19/2007  . GENITAL HERPES, HX OF 01/19/2007  . BREAST CANCER, HX OF 06/19/1998    ROS   Patient  denies fever, chills, headache, sweats, rash, change in vision, change in hearing, chest pain, cough, nausea or vomiting, urinary symptoms. All other systems are reviewed and are  negative other than the history of present illness.  PHYSICAL EXAM  Patient is oriented to person time and place. Affect is normal. There is no jugulovenous distention. Lungs are clear. Respiratory effort is nonlabored. Cardiac exam reveals crisp closure sound of her mitral prosthesis. No murmurs are heard. The abdomen is soft. Is no peripheral edema.  Filed Vitals:   09/20/13 1425  BP: 118/76  Pulse: 75  Height: 5' 9.5" (1.765 m)  Weight: 148 lb 12.8 oz (67.495 kg)   EKG is done today and reviewed by me. There is normal sinus rhythm. Her scattered PVCs.  ASSESSMENT & PLAN

## 2013-09-20 NOTE — Assessment & Plan Note (Signed)
Followup echo after her mitral valve surgery shows that her pulmonary pressures have returned to normal. No further workup.

## 2013-09-20 NOTE — Assessment & Plan Note (Signed)
The patient's resting heart rate is stable. She mentions that after a long walk to rehabilitation her initial resting rate may be 100. This is not a problem. Her resting rate here today is stable. No further workup.

## 2013-09-20 NOTE — Assessment & Plan Note (Signed)
The patient notices some strong beats when it is quiet at nighttime. I explained to her that these are a post-PVC beats that she's feeling. No further workup.

## 2013-09-20 NOTE — Assessment & Plan Note (Signed)
The patient needs lifelong Coumadin for her mechanical mitral prosthesis. Today she asked me about taking supplemental vitamin K. At first I was shocked by this. She showed me literature that she had been reading about the importance of vitamin K as it relates to calcification of vessels. I made it clear to her that her Coumadin is a vitamin K antagonists. It is extremely important that she not use a supplement that would affect her anticoagulation. We talked about this at great length. The Coumadin clinic is very carefully aware of a very small amount of medicine that she's taking. I told her that my preference would be to take none of the vitamin K.  As part of today's evaluation I spent greater than 25 minutes with her total care. More than half of this time was spent with direct contact discussing these very important issues with her.

## 2013-09-20 NOTE — Assessment & Plan Note (Signed)
The patient is maintaining sinus rhythm after the Maze procedure she had associated with her mitral surgery.

## 2013-09-20 NOTE — Assessment & Plan Note (Signed)
The patient is doing extremely well after her mitral valve surgery. Her valve could not be repaired and she required a mechanical prosthesis.

## 2013-09-20 NOTE — Patient Instructions (Signed)
Your physician recommends that you continue on your current medications as directed. Please refer to the Current Medication list given to you today.  Your physician wants you to follow-up in: 1 year. You will receive a reminder letter in the mail two months in advance. If you don't receive a letter, please call our office to schedule the follow-up appointment.  

## 2013-09-22 ENCOUNTER — Encounter (HOSPITAL_COMMUNITY)
Admission: RE | Admit: 2013-09-22 | Discharge: 2013-09-22 | Disposition: A | Discharge status: Home / Self Care | Payer: Self-pay | Source: Ambulatory Visit | Attending: Cardiology | Admitting: Cardiology

## 2013-09-25 ENCOUNTER — Encounter (HOSPITAL_COMMUNITY)
Admission: RE | Admit: 2013-09-25 | Discharge: 2013-09-25 | Disposition: A | Discharge status: Home / Self Care | Payer: Self-pay | Source: Ambulatory Visit | Attending: Cardiology | Admitting: Cardiology

## 2013-09-27 ENCOUNTER — Encounter (HOSPITAL_COMMUNITY): Payer: Medicare Other

## 2013-09-29 ENCOUNTER — Encounter (HOSPITAL_COMMUNITY)
Admission: RE | Admit: 2013-09-29 | Discharge: 2013-09-29 | Disposition: A | Discharge status: Home / Self Care | Payer: Self-pay | Source: Ambulatory Visit | Attending: Cardiology | Admitting: Cardiology

## 2013-10-02 ENCOUNTER — Ambulatory Visit (INDEPENDENT_AMBULATORY_CARE_PROVIDER_SITE_OTHER): Payer: Medicare Other | Admitting: *Deleted

## 2013-10-02 ENCOUNTER — Encounter (HOSPITAL_COMMUNITY)
Admission: RE | Admit: 2013-10-02 | Discharge: 2013-10-02 | Disposition: A | Discharge status: Home / Self Care | Payer: Self-pay | Source: Ambulatory Visit | Attending: Cardiology | Admitting: Cardiology

## 2013-10-02 DIAGNOSIS — I4891 Unspecified atrial fibrillation: Secondary | ICD-10-CM

## 2013-10-02 DIAGNOSIS — Z954 Presence of other heart-valve replacement: Secondary | ICD-10-CM | POA: Diagnosis not present

## 2013-10-02 DIAGNOSIS — Z952 Presence of prosthetic heart valve: Secondary | ICD-10-CM

## 2013-10-02 DIAGNOSIS — Z7901 Long term (current) use of anticoagulants: Secondary | ICD-10-CM | POA: Diagnosis not present

## 2013-10-02 DIAGNOSIS — Z9889 Other specified postprocedural states: Secondary | ICD-10-CM

## 2013-10-02 DIAGNOSIS — I509 Heart failure, unspecified: Secondary | ICD-10-CM | POA: Diagnosis not present

## 2013-10-02 DIAGNOSIS — Z8679 Personal history of other diseases of the circulatory system: Secondary | ICD-10-CM

## 2013-10-03 DIAGNOSIS — R7989 Other specified abnormal findings of blood chemistry: Secondary | ICD-10-CM | POA: Diagnosis not present

## 2013-10-03 DIAGNOSIS — E559 Vitamin D deficiency, unspecified: Secondary | ICD-10-CM | POA: Diagnosis not present

## 2013-10-03 DIAGNOSIS — E039 Hypothyroidism, unspecified: Secondary | ICD-10-CM | POA: Diagnosis not present

## 2013-10-03 DIAGNOSIS — G35 Multiple sclerosis: Secondary | ICD-10-CM | POA: Diagnosis not present

## 2013-10-04 ENCOUNTER — Encounter (HOSPITAL_COMMUNITY): Payer: Medicare Other

## 2013-10-06 ENCOUNTER — Encounter (HOSPITAL_COMMUNITY)
Admission: RE | Admit: 2013-10-06 | Discharge: 2013-10-06 | Disposition: A | Discharge status: Home / Self Care | Payer: Self-pay | Source: Ambulatory Visit | Attending: Cardiology | Admitting: Cardiology

## 2013-10-07 DIAGNOSIS — N951 Menopausal and female climacteric states: Secondary | ICD-10-CM | POA: Diagnosis not present

## 2013-10-09 ENCOUNTER — Encounter (HOSPITAL_COMMUNITY)
Admission: RE | Admit: 2013-10-09 | Discharge: 2013-10-09 | Disposition: A | Discharge status: Home / Self Care | Payer: Self-pay | Source: Ambulatory Visit | Attending: Cardiology | Admitting: Cardiology

## 2013-10-11 ENCOUNTER — Encounter (HOSPITAL_COMMUNITY): Payer: Medicare Other

## 2013-10-16 ENCOUNTER — Ambulatory Visit (INDEPENDENT_AMBULATORY_CARE_PROVIDER_SITE_OTHER): Payer: Medicare Other | Admitting: Pharmacist

## 2013-10-16 ENCOUNTER — Encounter (HOSPITAL_COMMUNITY)
Admission: RE | Admit: 2013-10-16 | Discharge: 2013-10-16 | Disposition: A | Discharge status: Home / Self Care | Payer: Self-pay | Source: Ambulatory Visit | Attending: Cardiology | Admitting: Cardiology

## 2013-10-16 DIAGNOSIS — Z8679 Personal history of other diseases of the circulatory system: Secondary | ICD-10-CM

## 2013-10-16 DIAGNOSIS — Z7901 Long term (current) use of anticoagulants: Secondary | ICD-10-CM | POA: Diagnosis not present

## 2013-10-16 DIAGNOSIS — I509 Heart failure, unspecified: Secondary | ICD-10-CM

## 2013-10-16 DIAGNOSIS — Z9889 Other specified postprocedural states: Secondary | ICD-10-CM

## 2013-10-16 DIAGNOSIS — Z954 Presence of other heart-valve replacement: Secondary | ICD-10-CM

## 2013-10-16 DIAGNOSIS — I4891 Unspecified atrial fibrillation: Secondary | ICD-10-CM | POA: Diagnosis not present

## 2013-10-16 DIAGNOSIS — Z952 Presence of prosthetic heart valve: Secondary | ICD-10-CM

## 2013-10-18 ENCOUNTER — Encounter (HOSPITAL_COMMUNITY): Payer: Medicare Other

## 2013-10-20 ENCOUNTER — Encounter (HOSPITAL_COMMUNITY)
Admission: RE | Admit: 2013-10-20 | Discharge: 2013-10-20 | Disposition: A | Discharge status: Home / Self Care | Payer: Self-pay | Source: Ambulatory Visit | Attending: Cardiology | Admitting: Cardiology

## 2013-10-20 DIAGNOSIS — I2789 Other specified pulmonary heart diseases: Secondary | ICD-10-CM | POA: Insufficient documentation

## 2013-10-20 DIAGNOSIS — I4891 Unspecified atrial fibrillation: Secondary | ICD-10-CM | POA: Insufficient documentation

## 2013-10-20 DIAGNOSIS — Z5189 Encounter for other specified aftercare: Secondary | ICD-10-CM | POA: Insufficient documentation

## 2013-10-20 DIAGNOSIS — I059 Rheumatic mitral valve disease, unspecified: Secondary | ICD-10-CM | POA: Insufficient documentation

## 2013-10-23 ENCOUNTER — Encounter (HOSPITAL_COMMUNITY)
Admission: RE | Admit: 2013-10-23 | Discharge: 2013-10-23 | Disposition: A | Discharge status: Home / Self Care | Payer: Self-pay | Source: Ambulatory Visit | Attending: Cardiology | Admitting: Cardiology

## 2013-10-24 DIAGNOSIS — G35 Multiple sclerosis: Secondary | ICD-10-CM | POA: Diagnosis not present

## 2013-10-25 ENCOUNTER — Encounter (HOSPITAL_COMMUNITY): Payer: Medicare Other

## 2013-10-27 ENCOUNTER — Encounter (HOSPITAL_COMMUNITY): Payer: Medicare Other

## 2013-10-30 ENCOUNTER — Encounter (HOSPITAL_COMMUNITY)
Admission: RE | Admit: 2013-10-30 | Discharge: 2013-10-30 | Disposition: A | Discharge status: Home / Self Care | Payer: Self-pay | Source: Ambulatory Visit | Attending: Cardiology | Admitting: Cardiology

## 2013-11-01 ENCOUNTER — Encounter (HOSPITAL_COMMUNITY): Payer: Medicare Other

## 2013-11-03 ENCOUNTER — Encounter (HOSPITAL_COMMUNITY)
Admission: RE | Admit: 2013-11-03 | Discharge: 2013-11-03 | Disposition: A | Discharge status: Home / Self Care | Payer: Self-pay | Source: Ambulatory Visit | Attending: Cardiology | Admitting: Cardiology

## 2013-11-05 IMAGING — CR DG CHEST 1V PORT
1 series · 1 of 1 positions shown · non-contrast
Comparison: Chest radiograph 01/26/2012

CLINICAL DATA: Postop heart surgery

PORTABLE CHEST - 1 VIEW

[AP]
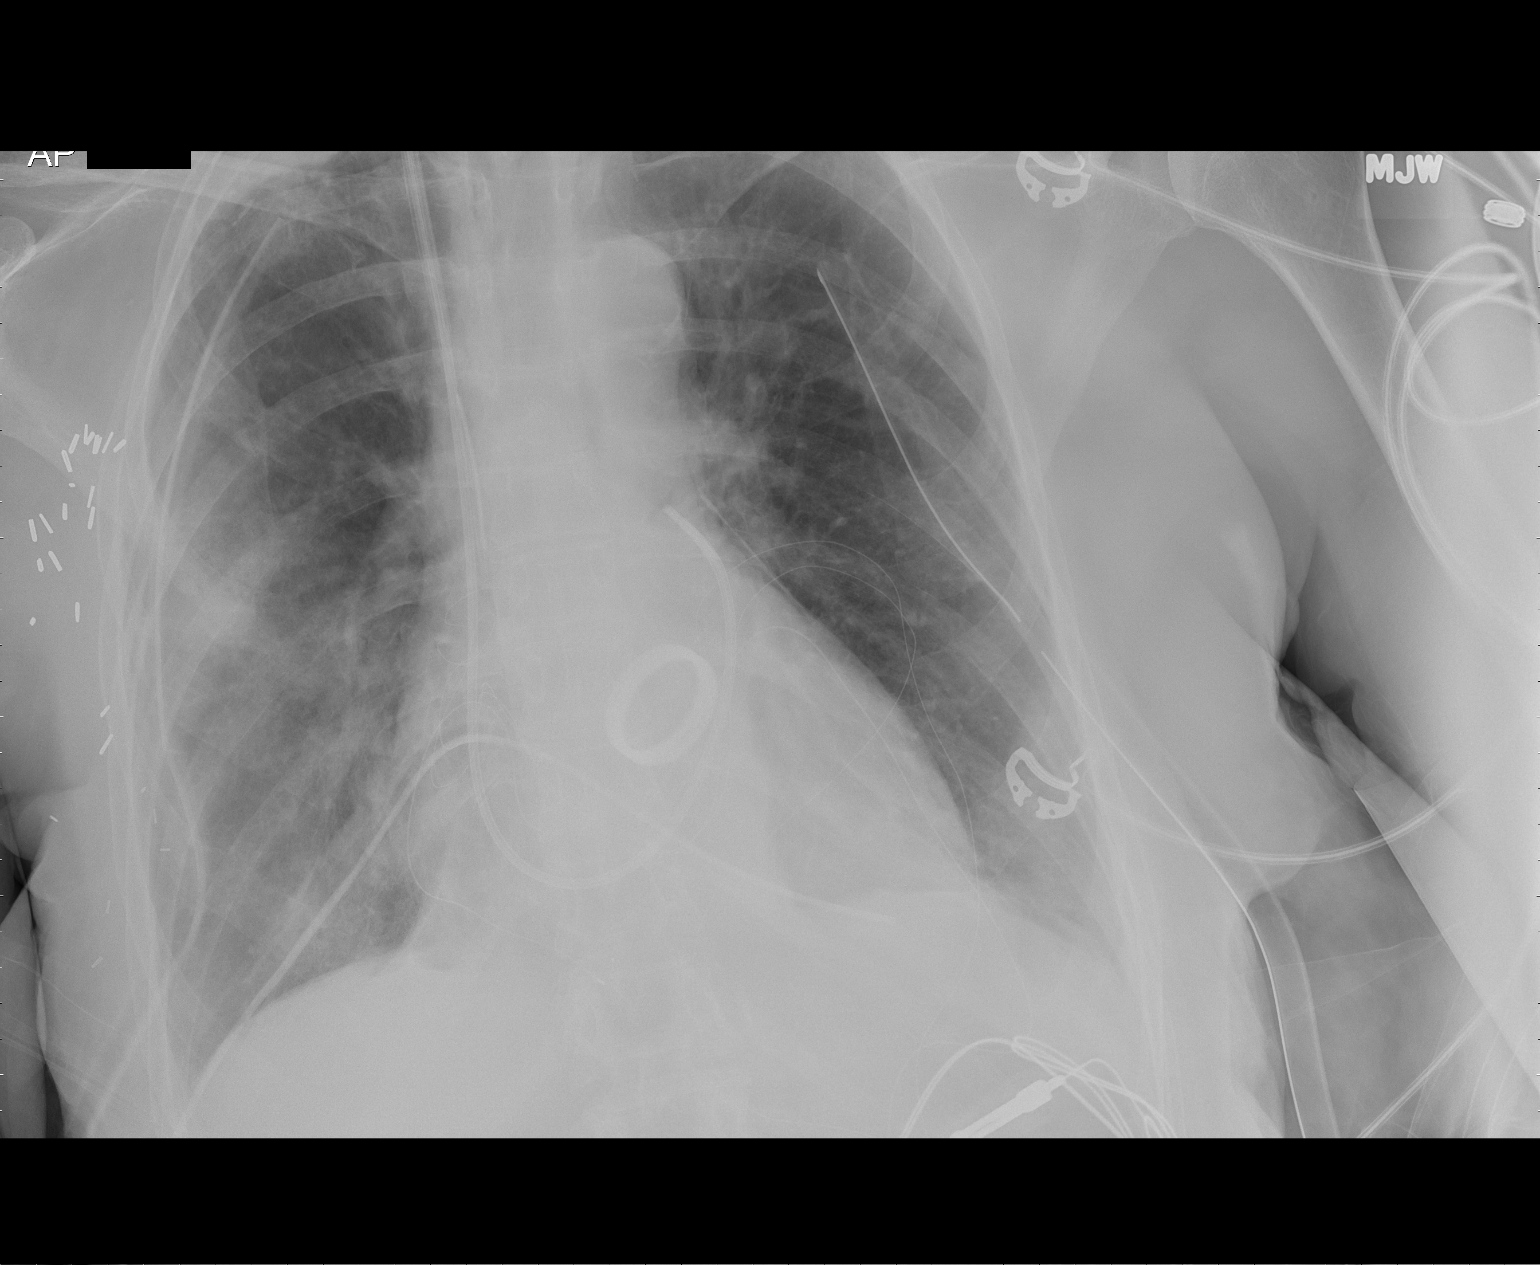

[1 of 1 positions shown; findings below may reference images not displayed]

FINDINGS: Interval extubation with no increase in atelectasis.
There is some improvement in the air space opacity in the right mid
lung.  Swan-Ganz catheter, mediastinal drain, and left chest tube
are unchanged.  There is interval removal of NG tube.  Additionally
there is a right chest tube.
IMPRESSION: 1..  Interval extubation without complication.
2.  Improved aeration in the right mid lung.
3.  Stable remaining support apparatus of pneumothorax.

## 2013-11-06 ENCOUNTER — Encounter (HOSPITAL_COMMUNITY)
Admission: RE | Admit: 2013-11-06 | Discharge: 2013-11-06 | Disposition: A | Discharge status: Home / Self Care | Payer: Self-pay | Source: Ambulatory Visit | Attending: Cardiology | Admitting: Cardiology

## 2013-11-06 ENCOUNTER — Ambulatory Visit (INDEPENDENT_AMBULATORY_CARE_PROVIDER_SITE_OTHER): Payer: Medicare Other | Admitting: Pharmacist

## 2013-11-06 DIAGNOSIS — Z954 Presence of other heart-valve replacement: Secondary | ICD-10-CM

## 2013-11-06 DIAGNOSIS — Z8679 Personal history of other diseases of the circulatory system: Secondary | ICD-10-CM

## 2013-11-06 DIAGNOSIS — I509 Heart failure, unspecified: Secondary | ICD-10-CM | POA: Diagnosis not present

## 2013-11-06 DIAGNOSIS — Z9889 Other specified postprocedural states: Secondary | ICD-10-CM

## 2013-11-06 DIAGNOSIS — Z7901 Long term (current) use of anticoagulants: Secondary | ICD-10-CM | POA: Diagnosis not present

## 2013-11-06 DIAGNOSIS — I4891 Unspecified atrial fibrillation: Secondary | ICD-10-CM | POA: Diagnosis not present

## 2013-11-06 DIAGNOSIS — Z952 Presence of prosthetic heart valve: Secondary | ICD-10-CM

## 2013-11-06 LAB — POCT INR: INR: 2

## 2013-11-08 ENCOUNTER — Encounter (HOSPITAL_COMMUNITY): Payer: Medicare Other

## 2013-11-10 ENCOUNTER — Encounter (HOSPITAL_COMMUNITY)
Admission: RE | Admit: 2013-11-10 | Discharge: 2013-11-10 | Disposition: A | Discharge status: Home / Self Care | Payer: Self-pay | Source: Ambulatory Visit | Attending: Cardiology | Admitting: Cardiology

## 2013-11-13 ENCOUNTER — Encounter (HOSPITAL_COMMUNITY)
Admission: RE | Admit: 2013-11-13 | Discharge: 2013-11-13 | Disposition: A | Discharge status: Home / Self Care | Payer: Self-pay | Source: Ambulatory Visit | Attending: Cardiology | Admitting: Cardiology

## 2013-11-15 ENCOUNTER — Encounter (HOSPITAL_COMMUNITY): Payer: Medicare Other

## 2013-11-17 ENCOUNTER — Encounter (HOSPITAL_COMMUNITY)
Admission: RE | Admit: 2013-11-17 | Discharge: 2013-11-17 | Disposition: A | Discharge status: Home / Self Care | Payer: Self-pay | Source: Ambulatory Visit | Attending: Cardiology | Admitting: Cardiology

## 2013-11-20 ENCOUNTER — Encounter (HOSPITAL_COMMUNITY)
Admission: RE | Admit: 2013-11-20 | Discharge: 2013-11-20 | Disposition: A | Discharge status: Home / Self Care | Payer: Self-pay | Source: Ambulatory Visit | Attending: Cardiology | Admitting: Cardiology

## 2013-11-20 DIAGNOSIS — I2789 Other specified pulmonary heart diseases: Secondary | ICD-10-CM | POA: Insufficient documentation

## 2013-11-20 DIAGNOSIS — I4891 Unspecified atrial fibrillation: Secondary | ICD-10-CM | POA: Insufficient documentation

## 2013-11-20 DIAGNOSIS — I059 Rheumatic mitral valve disease, unspecified: Secondary | ICD-10-CM | POA: Insufficient documentation

## 2013-11-20 DIAGNOSIS — Z5189 Encounter for other specified aftercare: Secondary | ICD-10-CM | POA: Insufficient documentation

## 2013-11-22 ENCOUNTER — Encounter (HOSPITAL_COMMUNITY): Payer: Medicare Other

## 2013-11-24 ENCOUNTER — Encounter (HOSPITAL_COMMUNITY)
Admission: RE | Admit: 2013-11-24 | Discharge: 2013-11-24 | Disposition: A | Discharge status: Home / Self Care | Payer: Self-pay | Source: Ambulatory Visit | Attending: Cardiology | Admitting: Cardiology

## 2013-11-27 ENCOUNTER — Ambulatory Visit (INDEPENDENT_AMBULATORY_CARE_PROVIDER_SITE_OTHER): Payer: Medicare Other | Admitting: *Deleted

## 2013-11-27 ENCOUNTER — Encounter (HOSPITAL_COMMUNITY)
Admission: RE | Admit: 2013-11-27 | Discharge: 2013-11-27 | Disposition: A | Discharge status: Home / Self Care | Payer: Self-pay | Source: Ambulatory Visit | Attending: Cardiology | Admitting: Cardiology

## 2013-11-27 DIAGNOSIS — Z9889 Other specified postprocedural states: Secondary | ICD-10-CM

## 2013-11-27 DIAGNOSIS — Z5181 Encounter for therapeutic drug level monitoring: Secondary | ICD-10-CM

## 2013-11-27 DIAGNOSIS — I4891 Unspecified atrial fibrillation: Secondary | ICD-10-CM

## 2013-11-27 DIAGNOSIS — Z7901 Long term (current) use of anticoagulants: Secondary | ICD-10-CM

## 2013-11-27 DIAGNOSIS — I509 Heart failure, unspecified: Secondary | ICD-10-CM | POA: Diagnosis not present

## 2013-11-27 DIAGNOSIS — Z8679 Personal history of other diseases of the circulatory system: Secondary | ICD-10-CM

## 2013-11-27 DIAGNOSIS — Z952 Presence of prosthetic heart valve: Secondary | ICD-10-CM

## 2013-11-27 DIAGNOSIS — Z954 Presence of other heart-valve replacement: Secondary | ICD-10-CM

## 2013-11-27 LAB — POCT INR: INR: 3

## 2013-11-29 ENCOUNTER — Encounter (HOSPITAL_COMMUNITY): Payer: Medicare Other

## 2013-12-01 ENCOUNTER — Encounter (HOSPITAL_COMMUNITY)
Admission: RE | Admit: 2013-12-01 | Discharge: 2013-12-01 | Disposition: A | Discharge status: Home / Self Care | Payer: Medicare Other | Source: Ambulatory Visit | Attending: Cardiology | Admitting: Cardiology

## 2013-12-04 ENCOUNTER — Encounter (HOSPITAL_COMMUNITY): Payer: Medicare Other

## 2013-12-06 ENCOUNTER — Encounter (HOSPITAL_COMMUNITY): Payer: Medicare Other

## 2013-12-06 DIAGNOSIS — G35 Multiple sclerosis: Secondary | ICD-10-CM | POA: Diagnosis not present

## 2013-12-08 ENCOUNTER — Encounter (HOSPITAL_COMMUNITY)
Admission: RE | Admit: 2013-12-08 | Discharge: 2013-12-08 | Disposition: A | Discharge status: Home / Self Care | Payer: Self-pay | Source: Ambulatory Visit | Attending: Cardiology | Admitting: Cardiology

## 2013-12-11 ENCOUNTER — Encounter (HOSPITAL_COMMUNITY)
Admission: RE | Admit: 2013-12-11 | Discharge: 2013-12-11 | Disposition: A | Discharge status: Home / Self Care | Payer: Self-pay | Source: Ambulatory Visit | Attending: Cardiology | Admitting: Cardiology

## 2013-12-13 ENCOUNTER — Encounter (HOSPITAL_COMMUNITY): Payer: Medicare Other

## 2013-12-15 ENCOUNTER — Encounter (HOSPITAL_COMMUNITY)
Admission: RE | Admit: 2013-12-15 | Discharge: 2013-12-15 | Disposition: A | Discharge status: Home / Self Care | Payer: Self-pay | Source: Ambulatory Visit | Attending: Cardiology | Admitting: Cardiology

## 2013-12-18 ENCOUNTER — Encounter (HOSPITAL_COMMUNITY)
Admission: RE | Admit: 2013-12-18 | Discharge: 2013-12-18 | Disposition: A | Discharge status: Home / Self Care | Payer: Self-pay | Source: Ambulatory Visit | Attending: Cardiology | Admitting: Cardiology

## 2013-12-18 DIAGNOSIS — I059 Rheumatic mitral valve disease, unspecified: Secondary | ICD-10-CM | POA: Insufficient documentation

## 2013-12-18 DIAGNOSIS — Z5189 Encounter for other specified aftercare: Secondary | ICD-10-CM | POA: Insufficient documentation

## 2013-12-18 DIAGNOSIS — I4891 Unspecified atrial fibrillation: Secondary | ICD-10-CM | POA: Insufficient documentation

## 2013-12-18 DIAGNOSIS — I2789 Other specified pulmonary heart diseases: Secondary | ICD-10-CM | POA: Insufficient documentation

## 2013-12-20 ENCOUNTER — Encounter (HOSPITAL_COMMUNITY): Payer: Medicare Other

## 2013-12-22 ENCOUNTER — Encounter (HOSPITAL_COMMUNITY)
Admission: RE | Admit: 2013-12-22 | Discharge: 2013-12-22 | Disposition: A | Discharge status: Home / Self Care | Payer: Self-pay | Source: Ambulatory Visit | Attending: Cardiology | Admitting: Cardiology

## 2013-12-25 ENCOUNTER — Encounter (HOSPITAL_COMMUNITY)
Admission: RE | Admit: 2013-12-25 | Discharge: 2013-12-25 | Disposition: A | Discharge status: Home / Self Care | Payer: Medicare Other | Source: Ambulatory Visit | Attending: Cardiology | Admitting: Cardiology

## 2013-12-27 ENCOUNTER — Encounter (HOSPITAL_COMMUNITY): Payer: Medicare Other

## 2013-12-29 ENCOUNTER — Ambulatory Visit (INDEPENDENT_AMBULATORY_CARE_PROVIDER_SITE_OTHER): Payer: Medicare Other | Admitting: *Deleted

## 2013-12-29 ENCOUNTER — Encounter (HOSPITAL_COMMUNITY)
Admission: RE | Admit: 2013-12-29 | Discharge: 2013-12-29 | Disposition: A | Discharge status: Home / Self Care | Payer: Self-pay | Source: Ambulatory Visit | Attending: Cardiology | Admitting: Cardiology

## 2013-12-29 DIAGNOSIS — Z5181 Encounter for therapeutic drug level monitoring: Secondary | ICD-10-CM

## 2013-12-29 DIAGNOSIS — Z9889 Other specified postprocedural states: Secondary | ICD-10-CM

## 2013-12-29 DIAGNOSIS — Z7901 Long term (current) use of anticoagulants: Secondary | ICD-10-CM | POA: Diagnosis not present

## 2013-12-29 DIAGNOSIS — I4891 Unspecified atrial fibrillation: Secondary | ICD-10-CM

## 2013-12-29 DIAGNOSIS — Z952 Presence of prosthetic heart valve: Secondary | ICD-10-CM

## 2013-12-29 DIAGNOSIS — I509 Heart failure, unspecified: Secondary | ICD-10-CM | POA: Diagnosis not present

## 2013-12-29 DIAGNOSIS — Z8679 Personal history of other diseases of the circulatory system: Secondary | ICD-10-CM

## 2013-12-29 DIAGNOSIS — Z954 Presence of other heart-valve replacement: Secondary | ICD-10-CM | POA: Diagnosis not present

## 2013-12-29 LAB — POCT INR: INR: 2.7

## 2014-01-01 ENCOUNTER — Encounter (HOSPITAL_COMMUNITY)
Admission: RE | Admit: 2014-01-01 | Discharge: 2014-01-01 | Disposition: A | Discharge status: Home / Self Care | Payer: Medicare Other | Source: Ambulatory Visit | Attending: Cardiology | Admitting: Cardiology

## 2014-01-03 ENCOUNTER — Encounter (HOSPITAL_COMMUNITY): Payer: Medicare Other

## 2014-01-05 ENCOUNTER — Telehealth (HOSPITAL_COMMUNITY): Payer: Self-pay | Admitting: *Deleted

## 2014-01-05 ENCOUNTER — Encounter (HOSPITAL_COMMUNITY): Admission: RE | Admit: 2014-01-05 | Payer: Medicare Other | Source: Ambulatory Visit

## 2014-01-05 DIAGNOSIS — M79609 Pain in unspecified limb: Secondary | ICD-10-CM | POA: Diagnosis not present

## 2014-01-08 ENCOUNTER — Encounter (HOSPITAL_COMMUNITY): Admission: RE | Admit: 2014-01-08 | Payer: Medicare Other | Source: Ambulatory Visit

## 2014-01-10 ENCOUNTER — Encounter (HOSPITAL_COMMUNITY): Payer: Medicare Other

## 2014-01-12 ENCOUNTER — Encounter (HOSPITAL_COMMUNITY)
Admission: RE | Admit: 2014-01-12 | Discharge: 2014-01-12 | Disposition: A | Discharge status: Home / Self Care | Payer: Self-pay | Source: Ambulatory Visit | Attending: Cardiology | Admitting: Cardiology

## 2014-01-15 ENCOUNTER — Encounter (HOSPITAL_COMMUNITY)
Admission: RE | Admit: 2014-01-15 | Discharge: 2014-01-15 | Disposition: A | Discharge status: Home / Self Care | Payer: Self-pay | Source: Ambulatory Visit | Attending: Cardiology | Admitting: Cardiology

## 2014-01-17 ENCOUNTER — Encounter (HOSPITAL_COMMUNITY): Payer: Medicare Other

## 2014-01-17 DIAGNOSIS — I4891 Unspecified atrial fibrillation: Secondary | ICD-10-CM | POA: Insufficient documentation

## 2014-01-17 DIAGNOSIS — I059 Rheumatic mitral valve disease, unspecified: Secondary | ICD-10-CM | POA: Insufficient documentation

## 2014-01-17 DIAGNOSIS — Z5189 Encounter for other specified aftercare: Secondary | ICD-10-CM | POA: Insufficient documentation

## 2014-01-17 DIAGNOSIS — I2789 Other specified pulmonary heart diseases: Secondary | ICD-10-CM | POA: Insufficient documentation

## 2014-01-19 ENCOUNTER — Encounter (HOSPITAL_COMMUNITY)
Admission: RE | Admit: 2014-01-19 | Discharge: 2014-01-19 | Disposition: A | Discharge status: Home / Self Care | Payer: Self-pay | Source: Ambulatory Visit | Attending: Cardiology | Admitting: Cardiology

## 2014-01-22 ENCOUNTER — Encounter (HOSPITAL_COMMUNITY)
Admission: RE | Admit: 2014-01-22 | Discharge: 2014-01-22 | Disposition: A | Discharge status: Home / Self Care | Payer: Self-pay | Source: Ambulatory Visit | Attending: Cardiology | Admitting: Cardiology

## 2014-01-24 ENCOUNTER — Encounter (HOSPITAL_COMMUNITY): Payer: Medicare Other

## 2014-01-26 ENCOUNTER — Ambulatory Visit (INDEPENDENT_AMBULATORY_CARE_PROVIDER_SITE_OTHER): Payer: Medicare Other | Admitting: *Deleted

## 2014-01-26 ENCOUNTER — Encounter (HOSPITAL_COMMUNITY)
Admission: RE | Admit: 2014-01-26 | Discharge: 2014-01-26 | Disposition: A | Discharge status: Home / Self Care | Payer: Self-pay | Source: Ambulatory Visit | Attending: Cardiology | Admitting: Cardiology

## 2014-01-26 DIAGNOSIS — I4891 Unspecified atrial fibrillation: Secondary | ICD-10-CM | POA: Diagnosis not present

## 2014-01-26 DIAGNOSIS — Z9889 Other specified postprocedural states: Secondary | ICD-10-CM

## 2014-01-26 DIAGNOSIS — I509 Heart failure, unspecified: Secondary | ICD-10-CM | POA: Diagnosis not present

## 2014-01-26 DIAGNOSIS — Z7901 Long term (current) use of anticoagulants: Secondary | ICD-10-CM | POA: Diagnosis not present

## 2014-01-26 DIAGNOSIS — Z954 Presence of other heart-valve replacement: Secondary | ICD-10-CM

## 2014-01-26 DIAGNOSIS — Z952 Presence of prosthetic heart valve: Secondary | ICD-10-CM

## 2014-01-26 DIAGNOSIS — Z5181 Encounter for therapeutic drug level monitoring: Secondary | ICD-10-CM | POA: Diagnosis not present

## 2014-01-26 DIAGNOSIS — Z8679 Personal history of other diseases of the circulatory system: Secondary | ICD-10-CM

## 2014-01-26 LAB — POCT INR: INR: 1.9

## 2014-01-29 ENCOUNTER — Encounter (HOSPITAL_COMMUNITY)
Admission: RE | Admit: 2014-01-29 | Discharge: 2014-01-29 | Disposition: A | Discharge status: Home / Self Care | Payer: Self-pay | Source: Ambulatory Visit | Attending: Cardiology | Admitting: Cardiology

## 2014-01-31 ENCOUNTER — Encounter (HOSPITAL_COMMUNITY): Payer: Medicare Other

## 2014-02-02 ENCOUNTER — Encounter (HOSPITAL_COMMUNITY)
Admission: RE | Admit: 2014-02-02 | Discharge: 2014-02-02 | Disposition: A | Discharge status: Home / Self Care | Payer: Self-pay | Source: Ambulatory Visit | Attending: Cardiology | Admitting: Cardiology

## 2014-02-05 ENCOUNTER — Encounter (HOSPITAL_COMMUNITY): Admission: RE | Admit: 2014-02-05 | Payer: Medicare Other | Source: Ambulatory Visit

## 2014-02-05 ENCOUNTER — Telehealth (HOSPITAL_COMMUNITY): Payer: Self-pay | Admitting: *Deleted

## 2014-02-07 ENCOUNTER — Encounter (HOSPITAL_COMMUNITY): Payer: Medicare Other

## 2014-02-07 DIAGNOSIS — G35 Multiple sclerosis: Secondary | ICD-10-CM | POA: Diagnosis not present

## 2014-02-08 DIAGNOSIS — M1610 Unilateral primary osteoarthritis, unspecified hip: Secondary | ICD-10-CM | POA: Diagnosis not present

## 2014-02-08 DIAGNOSIS — Z8349 Family history of other endocrine, nutritional and metabolic diseases: Secondary | ICD-10-CM | POA: Diagnosis not present

## 2014-02-08 DIAGNOSIS — M1710 Unilateral primary osteoarthritis, unspecified knee: Secondary | ICD-10-CM | POA: Diagnosis not present

## 2014-02-08 DIAGNOSIS — J309 Allergic rhinitis, unspecified: Secondary | ICD-10-CM | POA: Diagnosis not present

## 2014-02-08 DIAGNOSIS — Z8249 Family history of ischemic heart disease and other diseases of the circulatory system: Secondary | ICD-10-CM | POA: Diagnosis not present

## 2014-02-08 DIAGNOSIS — G35 Multiple sclerosis: Secondary | ICD-10-CM | POA: Diagnosis not present

## 2014-02-08 DIAGNOSIS — M169 Osteoarthritis of hip, unspecified: Secondary | ICD-10-CM | POA: Diagnosis not present

## 2014-02-08 DIAGNOSIS — A6000 Herpesviral infection of urogenital system, unspecified: Secondary | ICD-10-CM | POA: Diagnosis not present

## 2014-02-09 ENCOUNTER — Encounter (HOSPITAL_COMMUNITY)
Admission: RE | Admit: 2014-02-09 | Discharge: 2014-02-09 | Disposition: A | Discharge status: Home / Self Care | Payer: Self-pay | Source: Ambulatory Visit | Attending: Cardiology | Admitting: Cardiology

## 2014-02-09 ENCOUNTER — Ambulatory Visit (INDEPENDENT_AMBULATORY_CARE_PROVIDER_SITE_OTHER): Payer: Medicare Other | Admitting: Pharmacist

## 2014-02-09 DIAGNOSIS — Z9889 Other specified postprocedural states: Secondary | ICD-10-CM

## 2014-02-09 DIAGNOSIS — Z8679 Personal history of other diseases of the circulatory system: Secondary | ICD-10-CM

## 2014-02-09 DIAGNOSIS — I4891 Unspecified atrial fibrillation: Secondary | ICD-10-CM

## 2014-02-09 DIAGNOSIS — Z954 Presence of other heart-valve replacement: Secondary | ICD-10-CM

## 2014-02-09 DIAGNOSIS — I509 Heart failure, unspecified: Secondary | ICD-10-CM

## 2014-02-09 DIAGNOSIS — Z952 Presence of prosthetic heart valve: Secondary | ICD-10-CM

## 2014-02-09 DIAGNOSIS — Z5181 Encounter for therapeutic drug level monitoring: Secondary | ICD-10-CM

## 2014-02-09 DIAGNOSIS — Z7901 Long term (current) use of anticoagulants: Secondary | ICD-10-CM

## 2014-02-09 LAB — POCT INR: INR: 3

## 2014-02-12 ENCOUNTER — Encounter (HOSPITAL_COMMUNITY)
Admission: RE | Admit: 2014-02-12 | Discharge: 2014-02-12 | Disposition: A | Discharge status: Home / Self Care | Payer: Self-pay | Source: Ambulatory Visit | Attending: Cardiology | Admitting: Cardiology

## 2014-02-14 ENCOUNTER — Encounter (HOSPITAL_COMMUNITY): Payer: Medicare Other

## 2014-02-16 ENCOUNTER — Encounter (HOSPITAL_COMMUNITY): Payer: Medicare Other

## 2014-02-16 DIAGNOSIS — Z5189 Encounter for other specified aftercare: Secondary | ICD-10-CM | POA: Insufficient documentation

## 2014-02-16 DIAGNOSIS — I4891 Unspecified atrial fibrillation: Secondary | ICD-10-CM | POA: Insufficient documentation

## 2014-02-16 DIAGNOSIS — I059 Rheumatic mitral valve disease, unspecified: Secondary | ICD-10-CM | POA: Insufficient documentation

## 2014-02-16 DIAGNOSIS — I2789 Other specified pulmonary heart diseases: Secondary | ICD-10-CM | POA: Insufficient documentation

## 2014-02-19 ENCOUNTER — Encounter (HOSPITAL_COMMUNITY)
Admission: RE | Admit: 2014-02-19 | Discharge: 2014-02-19 | Disposition: A | Discharge status: Home / Self Care | Payer: Self-pay | Source: Ambulatory Visit | Attending: Cardiology | Admitting: Cardiology

## 2014-02-20 ENCOUNTER — Other Ambulatory Visit: Payer: Self-pay | Admitting: *Deleted

## 2014-02-20 DIAGNOSIS — I4891 Unspecified atrial fibrillation: Secondary | ICD-10-CM

## 2014-02-20 DIAGNOSIS — Z8679 Personal history of other diseases of the circulatory system: Secondary | ICD-10-CM

## 2014-02-20 DIAGNOSIS — Z7901 Long term (current) use of anticoagulants: Secondary | ICD-10-CM

## 2014-02-20 DIAGNOSIS — Z952 Presence of prosthetic heart valve: Secondary | ICD-10-CM

## 2014-02-20 DIAGNOSIS — Z9889 Other specified postprocedural states: Secondary | ICD-10-CM

## 2014-02-20 MED ORDER — METOPROLOL TARTRATE 25 MG PO TABS: 25.0000 mg | ORAL_TABLET | Freq: Two times a day (BID) | ORAL | Status: DC

## 2014-02-20 MED ORDER — WARFARIN SODIUM 5 MG PO TABS: ORAL_TABLET | ORAL | Status: DC

## 2014-02-21 ENCOUNTER — Encounter (HOSPITAL_COMMUNITY): Payer: Medicare Other

## 2014-02-23 ENCOUNTER — Encounter (HOSPITAL_COMMUNITY)
Admission: RE | Admit: 2014-02-23 | Discharge: 2014-02-23 | Disposition: A | Discharge status: Home / Self Care | Payer: Self-pay | Source: Ambulatory Visit | Attending: Cardiology | Admitting: Cardiology

## 2014-02-26 ENCOUNTER — Encounter (HOSPITAL_COMMUNITY)
Admission: RE | Admit: 2014-02-26 | Discharge: 2014-02-26 | Disposition: A | Discharge status: Home / Self Care | Payer: Self-pay | Source: Ambulatory Visit | Attending: Cardiology | Admitting: Cardiology

## 2014-02-28 ENCOUNTER — Encounter (HOSPITAL_COMMUNITY): Payer: Medicare Other

## 2014-03-02 ENCOUNTER — Encounter (HOSPITAL_COMMUNITY)
Admission: RE | Admit: 2014-03-02 | Discharge: 2014-03-02 | Disposition: A | Discharge status: Home / Self Care | Payer: Self-pay | Source: Ambulatory Visit | Attending: Cardiology | Admitting: Cardiology

## 2014-03-02 ENCOUNTER — Ambulatory Visit (INDEPENDENT_AMBULATORY_CARE_PROVIDER_SITE_OTHER): Payer: Medicare Other | Admitting: Pharmacist

## 2014-03-02 DIAGNOSIS — I509 Heart failure, unspecified: Secondary | ICD-10-CM | POA: Diagnosis not present

## 2014-03-02 DIAGNOSIS — Z9889 Other specified postprocedural states: Secondary | ICD-10-CM

## 2014-03-02 DIAGNOSIS — I4891 Unspecified atrial fibrillation: Secondary | ICD-10-CM

## 2014-03-02 DIAGNOSIS — Z952 Presence of prosthetic heart valve: Secondary | ICD-10-CM

## 2014-03-02 DIAGNOSIS — Z5181 Encounter for therapeutic drug level monitoring: Secondary | ICD-10-CM

## 2014-03-02 DIAGNOSIS — Z8679 Personal history of other diseases of the circulatory system: Secondary | ICD-10-CM

## 2014-03-02 DIAGNOSIS — Z954 Presence of other heart-valve replacement: Secondary | ICD-10-CM

## 2014-03-02 DIAGNOSIS — Z7901 Long term (current) use of anticoagulants: Secondary | ICD-10-CM | POA: Diagnosis not present

## 2014-03-02 LAB — POCT INR: INR: 3.7

## 2014-03-05 ENCOUNTER — Encounter (HOSPITAL_COMMUNITY)
Admission: RE | Admit: 2014-03-05 | Discharge: 2014-03-05 | Disposition: A | Discharge status: Home / Self Care | Payer: Self-pay | Source: Ambulatory Visit | Attending: Cardiology | Admitting: Cardiology

## 2014-03-07 ENCOUNTER — Encounter (HOSPITAL_COMMUNITY): Payer: Medicare Other

## 2014-03-09 ENCOUNTER — Encounter (HOSPITAL_COMMUNITY)
Admission: RE | Admit: 2014-03-09 | Discharge: 2014-03-09 | Disposition: A | Discharge status: Home / Self Care | Payer: Self-pay | Source: Ambulatory Visit | Attending: Cardiology | Admitting: Cardiology

## 2014-03-14 ENCOUNTER — Encounter (HOSPITAL_COMMUNITY): Payer: Medicare Other

## 2014-03-16 ENCOUNTER — Ambulatory Visit (INDEPENDENT_AMBULATORY_CARE_PROVIDER_SITE_OTHER): Payer: Medicare Other | Admitting: *Deleted

## 2014-03-16 ENCOUNTER — Encounter (HOSPITAL_COMMUNITY)
Admission: RE | Admit: 2014-03-16 | Discharge: 2014-03-16 | Disposition: A | Discharge status: Home / Self Care | Payer: Self-pay | Source: Ambulatory Visit | Attending: Cardiology | Admitting: Cardiology

## 2014-03-16 DIAGNOSIS — Z9889 Other specified postprocedural states: Secondary | ICD-10-CM

## 2014-03-16 DIAGNOSIS — Z7901 Long term (current) use of anticoagulants: Secondary | ICD-10-CM | POA: Diagnosis not present

## 2014-03-16 DIAGNOSIS — I4891 Unspecified atrial fibrillation: Secondary | ICD-10-CM

## 2014-03-16 DIAGNOSIS — Z954 Presence of other heart-valve replacement: Secondary | ICD-10-CM

## 2014-03-16 DIAGNOSIS — I509 Heart failure, unspecified: Secondary | ICD-10-CM

## 2014-03-16 DIAGNOSIS — Z8679 Personal history of other diseases of the circulatory system: Secondary | ICD-10-CM

## 2014-03-16 DIAGNOSIS — Z952 Presence of prosthetic heart valve: Secondary | ICD-10-CM

## 2014-03-16 DIAGNOSIS — Z5181 Encounter for therapeutic drug level monitoring: Secondary | ICD-10-CM | POA: Diagnosis not present

## 2014-03-16 LAB — POCT INR: INR: 3.2

## 2014-03-19 ENCOUNTER — Encounter (HOSPITAL_COMMUNITY): Payer: Medicare Other

## 2014-03-19 DIAGNOSIS — I4891 Unspecified atrial fibrillation: Secondary | ICD-10-CM | POA: Insufficient documentation

## 2014-03-19 DIAGNOSIS — I059 Rheumatic mitral valve disease, unspecified: Secondary | ICD-10-CM | POA: Insufficient documentation

## 2014-03-19 DIAGNOSIS — Z5189 Encounter for other specified aftercare: Secondary | ICD-10-CM | POA: Insufficient documentation

## 2014-03-19 DIAGNOSIS — I2789 Other specified pulmonary heart diseases: Secondary | ICD-10-CM | POA: Insufficient documentation

## 2014-03-21 ENCOUNTER — Encounter (HOSPITAL_COMMUNITY): Payer: Medicare Other

## 2014-03-23 ENCOUNTER — Encounter (HOSPITAL_COMMUNITY)
Admission: RE | Admit: 2014-03-23 | Discharge: 2014-03-23 | Disposition: A | Discharge status: Home / Self Care | Payer: Self-pay | Source: Ambulatory Visit | Attending: Cardiology | Admitting: Cardiology

## 2014-03-26 ENCOUNTER — Encounter (HOSPITAL_COMMUNITY)
Admission: RE | Admit: 2014-03-26 | Discharge: 2014-03-26 | Disposition: A | Discharge status: Home / Self Care | Payer: Self-pay | Source: Ambulatory Visit | Attending: Cardiology | Admitting: Cardiology

## 2014-03-28 ENCOUNTER — Encounter (HOSPITAL_COMMUNITY): Payer: Medicare Other

## 2014-03-30 ENCOUNTER — Encounter (HOSPITAL_COMMUNITY)
Admission: RE | Admit: 2014-03-30 | Discharge: 2014-03-30 | Disposition: A | Discharge status: Home / Self Care | Payer: Self-pay | Source: Ambulatory Visit | Attending: Cardiology | Admitting: Cardiology

## 2014-04-02 ENCOUNTER — Ambulatory Visit (INDEPENDENT_AMBULATORY_CARE_PROVIDER_SITE_OTHER): Payer: Medicare Other | Admitting: Thoracic Surgery (Cardiothoracic Vascular Surgery)

## 2014-04-02 ENCOUNTER — Encounter: Payer: Self-pay | Admitting: Thoracic Surgery (Cardiothoracic Vascular Surgery)

## 2014-04-02 ENCOUNTER — Encounter (HOSPITAL_COMMUNITY)
Admission: RE | Admit: 2014-04-02 | Discharge: 2014-04-02 | Disposition: A | Discharge status: Home / Self Care | Payer: Self-pay | Source: Ambulatory Visit | Attending: Cardiology | Admitting: Cardiology

## 2014-04-02 VITALS — BP 122/75 | HR 89 | Resp 19 | Ht 69.5 in | Wt 150.0 lb

## 2014-04-02 DIAGNOSIS — Z9889 Other specified postprocedural states: Secondary | ICD-10-CM

## 2014-04-02 DIAGNOSIS — Z8679 Personal history of other diseases of the circulatory system: Secondary | ICD-10-CM

## 2014-04-02 DIAGNOSIS — I059 Rheumatic mitral valve disease, unspecified: Secondary | ICD-10-CM | POA: Diagnosis not present

## 2014-04-02 NOTE — Progress Notes (Signed)
[address]       [place] [address]             [phone]     CARDIOTHORACIC SURGERY OFFICE NOTE  Referring Provider is [name], MD PCP is Elba Barman, MD   HPI:  Patient returns for followup and rhythm surveillance more than two years status post minimally invasive mitral valve replacement with a mechanical prosthesis and Maze procedure on 01/26/2012. She was last seen here in the office on 03/27/2013.  Since then she has continued to do very well. She reports normal exercise tolerance without any limitations. She continues to participate in the outpatient cardiac rehabilitation maintenance program. She denies any exertional shortness of breath, even with relatively strenuous exertion. She is limited primarily by her chronic musculoskeletal weakness with multiple sclerosis.  She has not had any problems managing long-term anticoagulation using Coumadin. She has not had any documented recurrence of atrial fibrillation.    Current Outpatient Prescriptions  Medication Sig Dispense Refill  . Black Cohosh 40 MG CAPS Take 1 capsule by mouth daily.      . Bromelains (BROMELAIN PO) Take 1 capsule by mouth 2 (two) times daily.      . Cholecalciferol (VITAMIN D) 2000 UNITS CAPS Take 6,000 Units by mouth daily.      . Coenzyme Q10 (CO Q-10) 100 MG CAPS Take 1 capsule by mouth daily.       . Cranberry 500 MG CAPS Take 1 capsule by mouth daily.      Marland Kitchen L-THEANINE PO Take 1 capsule by mouth daily.       Marland Kitchen lactose free nutrition (BOOST PLUS) LIQD Take 237 mLs by mouth daily.       Marland Kitchen Lysine 500 MG CAPS Take 1 capsule by mouth daily.      . metoprolol tartrate (LOPRESSOR) 25 MG tablet Take 1 tablet (25 mg total) by mouth 2 (two) times daily.  60 tablet  6  . MILK THISTLE PO Take 1 capsule by mouth 2 (two) times daily.      . Misc Natural Products (GLUCOSAMINE CHOND COMPLEX/MSM PO) Strength of dose Glucosamine 1500mg  /Chodroitron 1000mg / MSM 500mg  takes one  twice daily      . Olive Leaf 500 MG CAPS Take 1 capsule by mouth 2 (two) times daily.       Marland Kitchen OVER THE COUNTER MEDICATION Red marine algae - 2 capsules daily      . Probiotic Product (PROBIOTIC DAILY PO) Take 1 capsule once a day      . PROGESTERONE MICRONIZED PO Take 50 mg by mouth 2 (two) times daily.       Marland Kitchen thyroid (ARMOUR) 60 MG tablet Take 60 mg by mouth daily.      . vitamin A 10000 UNIT capsule Take 10,000 Units by mouth daily.      . vitamin E 400 UNIT capsule Take 400 Units by mouth daily.      Marland Kitchen warfarin (COUMADIN) 5 MG tablet 1 tablet every day except 1 and 1/2 tablet on Mondays Wednesdays and Fridays  Or as directed by coumadin clinic  45 tablet  3   No current facility-administered medications for this visit.      Physical Exam:   BP 122/75  Pulse 89  Resp 19  Ht 5' 9.5" (1.765 m)  Wt 150 lb (68.04 kg)  BMI 21.84 kg/m2  SpO2 98%  General:  Well-appearing  Chest:   Clear to  auscultation  CV:   Regular rate and rhythm without murmur  Incisions:  Completely healed  Abdomen:  Soft nontender  Extremities:  Warm and well-perfused with no lower extremity edema  Diagnostic Tests:  n/a   Impression:  Patient is doing well more than 2 years status post minimally invasive mitral valve replacement and Maze procedure. She is maintaining sinus rhythm.  Plan:  In the future the patient will call and return to see [place] as needed. She has been reminded regarding the need for long-term antibiotic prophylaxis for all dental cleaning and related procedures.  I spent in excess of 15 minutes during the conduct of this office consultation and >50% of this time involved direct face-to-face encounter with the patient for counseling and/or coordination of their care.    Valentina Gu. Roxy Manns, MD 04/02/2014 2:02 PM

## 2014-04-02 NOTE — Patient Instructions (Signed)

## 2014-04-04 ENCOUNTER — Encounter (HOSPITAL_COMMUNITY): Payer: Medicare Other

## 2014-04-06 ENCOUNTER — Encounter (HOSPITAL_COMMUNITY): Payer: Medicare Other

## 2014-04-09 ENCOUNTER — Encounter (HOSPITAL_COMMUNITY)
Admission: RE | Admit: 2014-04-09 | Discharge: 2014-04-09 | Disposition: A | Discharge status: Home / Self Care | Payer: Self-pay | Source: Ambulatory Visit | Attending: Cardiology | Admitting: Cardiology

## 2014-04-09 ENCOUNTER — Ambulatory Visit (INDEPENDENT_AMBULATORY_CARE_PROVIDER_SITE_OTHER): Payer: Medicare Other | Admitting: *Deleted

## 2014-04-09 DIAGNOSIS — Z5181 Encounter for therapeutic drug level monitoring: Secondary | ICD-10-CM

## 2014-04-09 DIAGNOSIS — Z9889 Other specified postprocedural states: Secondary | ICD-10-CM

## 2014-04-09 DIAGNOSIS — Z8679 Personal history of other diseases of the circulatory system: Secondary | ICD-10-CM

## 2014-04-09 DIAGNOSIS — Z954 Presence of other heart-valve replacement: Secondary | ICD-10-CM | POA: Diagnosis not present

## 2014-04-09 DIAGNOSIS — I4891 Unspecified atrial fibrillation: Secondary | ICD-10-CM

## 2014-04-09 DIAGNOSIS — Z7901 Long term (current) use of anticoagulants: Secondary | ICD-10-CM

## 2014-04-09 DIAGNOSIS — Z952 Presence of prosthetic heart valve: Secondary | ICD-10-CM

## 2014-04-09 LAB — POCT INR: INR: 1.8

## 2014-04-11 ENCOUNTER — Encounter (HOSPITAL_COMMUNITY): Payer: Medicare Other

## 2014-04-13 ENCOUNTER — Encounter (HOSPITAL_COMMUNITY)
Admission: RE | Admit: 2014-04-13 | Discharge: 2014-04-13 | Disposition: A | Discharge status: Home / Self Care | Payer: Self-pay | Source: Ambulatory Visit | Attending: Cardiology | Admitting: Cardiology

## 2014-04-16 ENCOUNTER — Encounter (HOSPITAL_COMMUNITY)
Admission: RE | Admit: 2014-04-16 | Discharge: 2014-04-16 | Disposition: A | Discharge status: Home / Self Care | Payer: Self-pay | Source: Ambulatory Visit | Attending: Cardiology | Admitting: Cardiology

## 2014-04-18 ENCOUNTER — Encounter (HOSPITAL_COMMUNITY): Payer: Medicare Other

## 2014-04-18 DIAGNOSIS — I2789 Other specified pulmonary heart diseases: Secondary | ICD-10-CM | POA: Insufficient documentation

## 2014-04-18 DIAGNOSIS — E039 Hypothyroidism, unspecified: Secondary | ICD-10-CM | POA: Diagnosis not present

## 2014-04-18 DIAGNOSIS — I4891 Unspecified atrial fibrillation: Secondary | ICD-10-CM | POA: Insufficient documentation

## 2014-04-18 DIAGNOSIS — R7989 Other specified abnormal findings of blood chemistry: Secondary | ICD-10-CM | POA: Diagnosis not present

## 2014-04-18 DIAGNOSIS — E559 Vitamin D deficiency, unspecified: Secondary | ICD-10-CM | POA: Diagnosis not present

## 2014-04-18 DIAGNOSIS — I059 Rheumatic mitral valve disease, unspecified: Secondary | ICD-10-CM | POA: Insufficient documentation

## 2014-04-18 DIAGNOSIS — Z5189 Encounter for other specified aftercare: Secondary | ICD-10-CM | POA: Insufficient documentation

## 2014-04-18 DIAGNOSIS — G35 Multiple sclerosis: Secondary | ICD-10-CM | POA: Diagnosis not present

## 2014-04-20 ENCOUNTER — Encounter (HOSPITAL_COMMUNITY): Payer: Medicare Other

## 2014-04-23 ENCOUNTER — Encounter (HOSPITAL_COMMUNITY)
Admission: RE | Admit: 2014-04-23 | Discharge: 2014-04-23 | Disposition: A | Discharge status: Home / Self Care | Payer: Self-pay | Source: Ambulatory Visit | Attending: Cardiology | Admitting: Cardiology

## 2014-04-23 ENCOUNTER — Ambulatory Visit (INDEPENDENT_AMBULATORY_CARE_PROVIDER_SITE_OTHER): Payer: Medicare Other | Admitting: *Deleted

## 2014-04-23 DIAGNOSIS — Z7901 Long term (current) use of anticoagulants: Secondary | ICD-10-CM

## 2014-04-23 DIAGNOSIS — Z954 Presence of other heart-valve replacement: Secondary | ICD-10-CM

## 2014-04-23 DIAGNOSIS — Z5181 Encounter for therapeutic drug level monitoring: Secondary | ICD-10-CM | POA: Diagnosis not present

## 2014-04-23 DIAGNOSIS — I4891 Unspecified atrial fibrillation: Secondary | ICD-10-CM | POA: Diagnosis not present

## 2014-04-23 DIAGNOSIS — Z9889 Other specified postprocedural states: Secondary | ICD-10-CM

## 2014-04-23 DIAGNOSIS — Z8679 Personal history of other diseases of the circulatory system: Secondary | ICD-10-CM

## 2014-04-23 DIAGNOSIS — Z952 Presence of prosthetic heart valve: Secondary | ICD-10-CM

## 2014-04-23 LAB — POCT INR: INR: 3.2

## 2014-04-25 ENCOUNTER — Encounter (HOSPITAL_COMMUNITY): Payer: Medicare Other

## 2014-04-27 ENCOUNTER — Encounter (HOSPITAL_COMMUNITY)
Admission: RE | Admit: 2014-04-27 | Discharge: 2014-04-27 | Disposition: A | Discharge status: Home / Self Care | Payer: Self-pay | Source: Ambulatory Visit | Attending: Cardiology | Admitting: Cardiology

## 2014-04-30 ENCOUNTER — Encounter (HOSPITAL_COMMUNITY): Payer: Medicare Other

## 2014-05-02 ENCOUNTER — Encounter (HOSPITAL_COMMUNITY): Payer: Medicare Other

## 2014-05-02 DIAGNOSIS — G35 Multiple sclerosis: Secondary | ICD-10-CM | POA: Diagnosis not present

## 2014-05-04 ENCOUNTER — Encounter (HOSPITAL_COMMUNITY)
Admission: RE | Admit: 2014-05-04 | Discharge: 2014-05-04 | Disposition: A | Discharge status: Home / Self Care | Payer: Self-pay | Source: Ambulatory Visit | Attending: Cardiology | Admitting: Cardiology

## 2014-05-07 ENCOUNTER — Encounter (HOSPITAL_COMMUNITY)
Admission: RE | Admit: 2014-05-07 | Discharge: 2014-05-07 | Disposition: A | Discharge status: Home / Self Care | Payer: Self-pay | Source: Ambulatory Visit | Attending: Cardiology | Admitting: Cardiology

## 2014-05-09 ENCOUNTER — Encounter (HOSPITAL_COMMUNITY): Payer: Medicare Other

## 2014-05-11 ENCOUNTER — Encounter (HOSPITAL_COMMUNITY)
Admission: RE | Admit: 2014-05-11 | Discharge: 2014-05-11 | Disposition: A | Discharge status: Home / Self Care | Payer: Self-pay | Source: Ambulatory Visit | Attending: Cardiology | Admitting: Cardiology

## 2014-05-14 ENCOUNTER — Encounter (HOSPITAL_COMMUNITY)
Admission: RE | Admit: 2014-05-14 | Discharge: 2014-05-14 | Disposition: A | Discharge status: Home / Self Care | Payer: Self-pay | Source: Ambulatory Visit | Attending: Cardiology | Admitting: Cardiology

## 2014-05-14 ENCOUNTER — Ambulatory Visit (INDEPENDENT_AMBULATORY_CARE_PROVIDER_SITE_OTHER): Payer: Medicare Other

## 2014-05-14 DIAGNOSIS — Z7901 Long term (current) use of anticoagulants: Secondary | ICD-10-CM | POA: Diagnosis not present

## 2014-05-14 DIAGNOSIS — Z954 Presence of other heart-valve replacement: Secondary | ICD-10-CM | POA: Diagnosis not present

## 2014-05-14 DIAGNOSIS — I4891 Unspecified atrial fibrillation: Secondary | ICD-10-CM

## 2014-05-14 DIAGNOSIS — Z952 Presence of prosthetic heart valve: Secondary | ICD-10-CM

## 2014-05-14 DIAGNOSIS — Z5181 Encounter for therapeutic drug level monitoring: Secondary | ICD-10-CM | POA: Diagnosis not present

## 2014-05-14 DIAGNOSIS — Z8679 Personal history of other diseases of the circulatory system: Secondary | ICD-10-CM

## 2014-05-14 DIAGNOSIS — Z9889 Other specified postprocedural states: Secondary | ICD-10-CM

## 2014-05-14 LAB — POCT INR: INR: 2.9

## 2014-05-16 ENCOUNTER — Encounter (HOSPITAL_COMMUNITY): Payer: Medicare Other

## 2014-05-18 ENCOUNTER — Encounter (HOSPITAL_COMMUNITY)
Admission: RE | Admit: 2014-05-18 | Discharge: 2014-05-18 | Disposition: A | Discharge status: Home / Self Care | Payer: Self-pay | Source: Ambulatory Visit | Attending: Cardiology | Admitting: Cardiology

## 2014-05-21 ENCOUNTER — Encounter (HOSPITAL_COMMUNITY)
Admission: RE | Admit: 2014-05-21 | Discharge: 2014-05-21 | Disposition: A | Discharge status: Home / Self Care | Payer: Self-pay | Source: Ambulatory Visit | Attending: Cardiology | Admitting: Cardiology

## 2014-05-21 DIAGNOSIS — Z5189 Encounter for other specified aftercare: Secondary | ICD-10-CM | POA: Insufficient documentation

## 2014-05-21 DIAGNOSIS — I4891 Unspecified atrial fibrillation: Secondary | ICD-10-CM | POA: Insufficient documentation

## 2014-05-21 DIAGNOSIS — I059 Rheumatic mitral valve disease, unspecified: Secondary | ICD-10-CM | POA: Insufficient documentation

## 2014-05-21 DIAGNOSIS — I2789 Other specified pulmonary heart diseases: Secondary | ICD-10-CM | POA: Insufficient documentation

## 2014-05-23 ENCOUNTER — Encounter (HOSPITAL_COMMUNITY): Payer: Medicare Other

## 2014-05-25 ENCOUNTER — Encounter (HOSPITAL_COMMUNITY)
Admission: RE | Admit: 2014-05-25 | Discharge: 2014-05-25 | Disposition: A | Discharge status: Home / Self Care | Payer: Self-pay | Source: Ambulatory Visit | Attending: Cardiology | Admitting: Cardiology

## 2014-05-28 ENCOUNTER — Encounter (HOSPITAL_COMMUNITY)
Admission: RE | Admit: 2014-05-28 | Discharge: 2014-05-28 | Disposition: A | Discharge status: Home / Self Care | Payer: Self-pay | Source: Ambulatory Visit | Attending: Cardiology | Admitting: Cardiology

## 2014-05-30 ENCOUNTER — Encounter (HOSPITAL_COMMUNITY): Payer: Medicare Other

## 2014-06-01 ENCOUNTER — Encounter (HOSPITAL_COMMUNITY)
Admission: RE | Admit: 2014-06-01 | Discharge: 2014-06-01 | Disposition: A | Discharge status: Home / Self Care | Payer: Self-pay | Source: Ambulatory Visit | Attending: Cardiology | Admitting: Cardiology

## 2014-06-04 ENCOUNTER — Encounter (HOSPITAL_COMMUNITY)
Admission: RE | Admit: 2014-06-04 | Discharge: 2014-06-04 | Disposition: A | Discharge status: Home / Self Care | Payer: Self-pay | Source: Ambulatory Visit | Attending: Cardiology | Admitting: Cardiology

## 2014-06-06 ENCOUNTER — Encounter (HOSPITAL_COMMUNITY): Payer: Medicare Other

## 2014-06-08 ENCOUNTER — Encounter (HOSPITAL_COMMUNITY)
Admission: RE | Admit: 2014-06-08 | Discharge: 2014-06-08 | Disposition: A | Discharge status: Home / Self Care | Payer: Self-pay | Source: Ambulatory Visit | Attending: Cardiology | Admitting: Cardiology

## 2014-06-11 ENCOUNTER — Ambulatory Visit (INDEPENDENT_AMBULATORY_CARE_PROVIDER_SITE_OTHER): Payer: Medicare Other | Admitting: *Deleted

## 2014-06-11 ENCOUNTER — Encounter (HOSPITAL_COMMUNITY): Payer: Medicare Other

## 2014-06-11 DIAGNOSIS — I4891 Unspecified atrial fibrillation: Secondary | ICD-10-CM

## 2014-06-11 DIAGNOSIS — Z5181 Encounter for therapeutic drug level monitoring: Secondary | ICD-10-CM | POA: Diagnosis not present

## 2014-06-11 DIAGNOSIS — Z9889 Other specified postprocedural states: Secondary | ICD-10-CM

## 2014-06-11 DIAGNOSIS — Z954 Presence of other heart-valve replacement: Secondary | ICD-10-CM | POA: Diagnosis not present

## 2014-06-11 DIAGNOSIS — Z8679 Personal history of other diseases of the circulatory system: Secondary | ICD-10-CM

## 2014-06-11 DIAGNOSIS — Z952 Presence of prosthetic heart valve: Secondary | ICD-10-CM

## 2014-06-11 DIAGNOSIS — Z7901 Long term (current) use of anticoagulants: Secondary | ICD-10-CM

## 2014-06-11 LAB — POCT INR: INR: 2

## 2014-06-13 ENCOUNTER — Encounter (HOSPITAL_COMMUNITY): Payer: Medicare Other

## 2014-06-15 ENCOUNTER — Encounter (HOSPITAL_COMMUNITY)
Admission: RE | Admit: 2014-06-15 | Discharge: 2014-06-15 | Disposition: A | Discharge status: Home / Self Care | Payer: Self-pay | Source: Ambulatory Visit | Attending: Cardiology | Admitting: Cardiology

## 2014-06-18 ENCOUNTER — Encounter (HOSPITAL_COMMUNITY)
Admission: RE | Admit: 2014-06-18 | Discharge: 2014-06-18 | Disposition: A | Discharge status: Home / Self Care | Payer: Self-pay | Source: Ambulatory Visit | Attending: Cardiology | Admitting: Cardiology

## 2014-06-20 ENCOUNTER — Encounter (HOSPITAL_COMMUNITY): Payer: Self-pay

## 2014-06-20 DIAGNOSIS — Z954 Presence of other heart-valve replacement: Secondary | ICD-10-CM | POA: Insufficient documentation

## 2014-06-20 DIAGNOSIS — I509 Heart failure, unspecified: Secondary | ICD-10-CM | POA: Insufficient documentation

## 2014-06-20 DIAGNOSIS — Z9889 Other specified postprocedural states: Secondary | ICD-10-CM | POA: Insufficient documentation

## 2014-06-22 ENCOUNTER — Encounter (HOSPITAL_COMMUNITY)
Admission: RE | Admit: 2014-06-22 | Discharge: 2014-06-22 | Disposition: A | Discharge status: Home / Self Care | Payer: Self-pay | Source: Ambulatory Visit | Attending: Cardiology | Admitting: Cardiology

## 2014-06-27 ENCOUNTER — Encounter (HOSPITAL_COMMUNITY): Payer: Self-pay

## 2014-06-27 DIAGNOSIS — G35 Multiple sclerosis: Secondary | ICD-10-CM | POA: Diagnosis not present

## 2014-06-29 ENCOUNTER — Ambulatory Visit (INDEPENDENT_AMBULATORY_CARE_PROVIDER_SITE_OTHER): Payer: Medicare Other | Admitting: *Deleted

## 2014-06-29 ENCOUNTER — Encounter (HOSPITAL_COMMUNITY)
Admission: RE | Admit: 2014-06-29 | Discharge: 2014-06-29 | Disposition: A | Discharge status: Home / Self Care | Payer: Self-pay | Source: Ambulatory Visit | Attending: Cardiology | Admitting: Cardiology

## 2014-06-29 DIAGNOSIS — Z952 Presence of prosthetic heart valve: Secondary | ICD-10-CM

## 2014-06-29 DIAGNOSIS — Z8679 Personal history of other diseases of the circulatory system: Secondary | ICD-10-CM

## 2014-06-29 DIAGNOSIS — Z5181 Encounter for therapeutic drug level monitoring: Secondary | ICD-10-CM

## 2014-06-29 DIAGNOSIS — Z7901 Long term (current) use of anticoagulants: Secondary | ICD-10-CM | POA: Diagnosis not present

## 2014-06-29 DIAGNOSIS — Z9889 Other specified postprocedural states: Secondary | ICD-10-CM

## 2014-06-29 DIAGNOSIS — I4891 Unspecified atrial fibrillation: Secondary | ICD-10-CM

## 2014-06-29 DIAGNOSIS — Z954 Presence of other heart-valve replacement: Secondary | ICD-10-CM

## 2014-06-29 LAB — POCT INR: INR: 2.9

## 2014-07-02 ENCOUNTER — Encounter (HOSPITAL_COMMUNITY)
Admission: RE | Admit: 2014-07-02 | Discharge: 2014-07-02 | Disposition: A | Discharge status: Home / Self Care | Payer: Self-pay | Source: Ambulatory Visit | Attending: Cardiology | Admitting: Cardiology

## 2014-07-04 ENCOUNTER — Encounter (HOSPITAL_COMMUNITY): Payer: Self-pay

## 2014-07-06 ENCOUNTER — Encounter (HOSPITAL_COMMUNITY): Payer: Self-pay

## 2014-07-09 ENCOUNTER — Encounter (HOSPITAL_COMMUNITY)
Admission: RE | Admit: 2014-07-09 | Discharge: 2014-07-09 | Disposition: A | Discharge status: Home / Self Care | Payer: Self-pay | Source: Ambulatory Visit | Attending: Cardiology | Admitting: Cardiology

## 2014-07-11 ENCOUNTER — Encounter (HOSPITAL_COMMUNITY): Payer: Self-pay

## 2014-07-11 DIAGNOSIS — R51 Headache: Secondary | ICD-10-CM | POA: Diagnosis not present

## 2014-07-11 DIAGNOSIS — S0990XA Unspecified injury of head, initial encounter: Secondary | ICD-10-CM | POA: Diagnosis not present

## 2014-07-11 DIAGNOSIS — W19XXXA Unspecified fall, initial encounter: Secondary | ICD-10-CM | POA: Diagnosis not present

## 2014-07-11 DIAGNOSIS — G35 Multiple sclerosis: Secondary | ICD-10-CM | POA: Diagnosis not present

## 2014-07-11 DIAGNOSIS — G44319 Acute post-traumatic headache, not intractable: Secondary | ICD-10-CM | POA: Diagnosis not present

## 2014-07-11 DIAGNOSIS — M238X9 Other internal derangements of unspecified knee: Secondary | ICD-10-CM | POA: Diagnosis not present

## 2014-07-13 ENCOUNTER — Encounter (HOSPITAL_COMMUNITY)
Admission: RE | Admit: 2014-07-13 | Discharge: 2014-07-13 | Disposition: A | Discharge status: Home / Self Care | Payer: Self-pay | Source: Ambulatory Visit | Attending: Cardiology | Admitting: Cardiology

## 2014-07-13 ENCOUNTER — Ambulatory Visit (INDEPENDENT_AMBULATORY_CARE_PROVIDER_SITE_OTHER): Payer: Medicare Other

## 2014-07-13 DIAGNOSIS — Z5181 Encounter for therapeutic drug level monitoring: Secondary | ICD-10-CM | POA: Diagnosis not present

## 2014-07-13 DIAGNOSIS — Z7901 Long term (current) use of anticoagulants: Secondary | ICD-10-CM | POA: Diagnosis not present

## 2014-07-13 DIAGNOSIS — Z9889 Other specified postprocedural states: Secondary | ICD-10-CM

## 2014-07-13 DIAGNOSIS — I4891 Unspecified atrial fibrillation: Secondary | ICD-10-CM | POA: Diagnosis not present

## 2014-07-13 DIAGNOSIS — Z952 Presence of prosthetic heart valve: Secondary | ICD-10-CM

## 2014-07-13 DIAGNOSIS — Z8679 Personal history of other diseases of the circulatory system: Secondary | ICD-10-CM

## 2014-07-13 DIAGNOSIS — Z954 Presence of other heart-valve replacement: Secondary | ICD-10-CM

## 2014-07-13 LAB — POCT INR: INR: 3

## 2014-07-16 ENCOUNTER — Encounter (HOSPITAL_COMMUNITY): Payer: Self-pay

## 2014-07-18 ENCOUNTER — Encounter (HOSPITAL_COMMUNITY): Payer: Self-pay

## 2014-07-20 ENCOUNTER — Encounter (HOSPITAL_COMMUNITY): Payer: Medicare Other

## 2014-07-20 DIAGNOSIS — I279 Pulmonary heart disease, unspecified: Secondary | ICD-10-CM | POA: Insufficient documentation

## 2014-07-20 DIAGNOSIS — I4891 Unspecified atrial fibrillation: Secondary | ICD-10-CM | POA: Insufficient documentation

## 2014-07-20 DIAGNOSIS — I059 Rheumatic mitral valve disease, unspecified: Secondary | ICD-10-CM | POA: Insufficient documentation

## 2014-07-20 DIAGNOSIS — Z5189 Encounter for other specified aftercare: Secondary | ICD-10-CM | POA: Insufficient documentation

## 2014-07-23 ENCOUNTER — Encounter (HOSPITAL_COMMUNITY): Payer: Self-pay

## 2014-07-25 ENCOUNTER — Encounter (HOSPITAL_COMMUNITY): Payer: Self-pay

## 2014-07-27 ENCOUNTER — Encounter (HOSPITAL_COMMUNITY): Payer: Self-pay

## 2014-07-30 ENCOUNTER — Encounter (HOSPITAL_COMMUNITY): Payer: Self-pay

## 2014-08-01 ENCOUNTER — Encounter (HOSPITAL_COMMUNITY): Payer: Self-pay

## 2014-08-03 ENCOUNTER — Encounter (HOSPITAL_COMMUNITY)
Admission: RE | Admit: 2014-08-03 | Discharge: 2014-08-03 | Disposition: A | Discharge status: Home / Self Care | Payer: Self-pay | Source: Ambulatory Visit | Attending: Cardiology | Admitting: Cardiology

## 2014-08-03 ENCOUNTER — Ambulatory Visit (INDEPENDENT_AMBULATORY_CARE_PROVIDER_SITE_OTHER): Payer: Medicare Other

## 2014-08-03 DIAGNOSIS — Z952 Presence of prosthetic heart valve: Secondary | ICD-10-CM

## 2014-08-03 DIAGNOSIS — I4891 Unspecified atrial fibrillation: Secondary | ICD-10-CM | POA: Diagnosis not present

## 2014-08-03 DIAGNOSIS — Z5181 Encounter for therapeutic drug level monitoring: Secondary | ICD-10-CM | POA: Diagnosis not present

## 2014-08-03 DIAGNOSIS — Z9889 Other specified postprocedural states: Secondary | ICD-10-CM

## 2014-08-03 DIAGNOSIS — Z7901 Long term (current) use of anticoagulants: Secondary | ICD-10-CM | POA: Diagnosis not present

## 2014-08-03 DIAGNOSIS — Z954 Presence of other heart-valve replacement: Secondary | ICD-10-CM

## 2014-08-03 DIAGNOSIS — Z8679 Personal history of other diseases of the circulatory system: Secondary | ICD-10-CM

## 2014-08-03 LAB — POCT INR: INR: 2.1

## 2014-08-06 ENCOUNTER — Encounter (HOSPITAL_COMMUNITY)
Admission: RE | Admit: 2014-08-06 | Discharge: 2014-08-06 | Disposition: A | Discharge status: Home / Self Care | Payer: Self-pay | Source: Ambulatory Visit | Attending: Cardiology | Admitting: Cardiology

## 2014-08-08 ENCOUNTER — Encounter (HOSPITAL_COMMUNITY): Admission: RE | Admit: 2014-08-08 | Payer: Self-pay | Source: Ambulatory Visit

## 2014-08-10 ENCOUNTER — Encounter (HOSPITAL_COMMUNITY)
Admission: RE | Admit: 2014-08-10 | Discharge: 2014-08-10 | Disposition: A | Discharge status: Home / Self Care | Payer: Self-pay | Source: Ambulatory Visit | Attending: Cardiology | Admitting: Cardiology

## 2014-08-13 ENCOUNTER — Encounter (HOSPITAL_COMMUNITY)
Admission: RE | Admit: 2014-08-13 | Discharge: 2014-08-13 | Disposition: A | Discharge status: Home / Self Care | Payer: Medicare Other | Source: Ambulatory Visit | Attending: Cardiology | Admitting: Cardiology

## 2014-08-15 ENCOUNTER — Encounter (HOSPITAL_COMMUNITY): Payer: Self-pay

## 2014-08-17 ENCOUNTER — Encounter (HOSPITAL_COMMUNITY)
Admission: RE | Admit: 2014-08-17 | Discharge: 2014-08-17 | Disposition: A | Discharge status: Home / Self Care | Payer: Self-pay | Source: Ambulatory Visit | Attending: Cardiology | Admitting: Cardiology

## 2014-08-17 ENCOUNTER — Other Ambulatory Visit: Payer: Self-pay | Admitting: *Deleted

## 2014-08-17 MED ORDER — WARFARIN SODIUM 5 MG PO TABS: 5.0000 mg | ORAL_TABLET | ORAL | Status: DC

## 2014-08-20 ENCOUNTER — Ambulatory Visit (INDEPENDENT_AMBULATORY_CARE_PROVIDER_SITE_OTHER): Payer: Medicare Other | Admitting: *Deleted

## 2014-08-20 ENCOUNTER — Encounter (HOSPITAL_COMMUNITY)
Admission: RE | Admit: 2014-08-20 | Discharge: 2014-08-20 | Disposition: A | Discharge status: Home / Self Care | Payer: Self-pay | Source: Ambulatory Visit | Attending: Cardiology | Admitting: Cardiology

## 2014-08-20 DIAGNOSIS — Z9889 Other specified postprocedural states: Secondary | ICD-10-CM

## 2014-08-20 DIAGNOSIS — I4891 Unspecified atrial fibrillation: Secondary | ICD-10-CM | POA: Diagnosis not present

## 2014-08-20 DIAGNOSIS — Z7901 Long term (current) use of anticoagulants: Secondary | ICD-10-CM | POA: Diagnosis not present

## 2014-08-20 DIAGNOSIS — Z954 Presence of other heart-valve replacement: Secondary | ICD-10-CM | POA: Diagnosis not present

## 2014-08-20 DIAGNOSIS — I059 Rheumatic mitral valve disease, unspecified: Secondary | ICD-10-CM | POA: Insufficient documentation

## 2014-08-20 DIAGNOSIS — Z5189 Encounter for other specified aftercare: Secondary | ICD-10-CM | POA: Insufficient documentation

## 2014-08-20 DIAGNOSIS — Z952 Presence of prosthetic heart valve: Secondary | ICD-10-CM

## 2014-08-20 DIAGNOSIS — Z5181 Encounter for therapeutic drug level monitoring: Secondary | ICD-10-CM | POA: Diagnosis not present

## 2014-08-20 DIAGNOSIS — Z8679 Personal history of other diseases of the circulatory system: Secondary | ICD-10-CM

## 2014-08-20 DIAGNOSIS — I279 Pulmonary heart disease, unspecified: Secondary | ICD-10-CM | POA: Insufficient documentation

## 2014-08-20 LAB — POCT INR: INR: 3.1

## 2014-08-22 ENCOUNTER — Encounter (HOSPITAL_COMMUNITY): Payer: Self-pay

## 2014-08-22 DIAGNOSIS — G35 Multiple sclerosis: Secondary | ICD-10-CM | POA: Diagnosis not present

## 2014-08-24 ENCOUNTER — Encounter (HOSPITAL_COMMUNITY)
Admission: RE | Admit: 2014-08-24 | Discharge: 2014-08-24 | Disposition: A | Discharge status: Home / Self Care | Payer: Self-pay | Source: Ambulatory Visit | Attending: Cardiology | Admitting: Cardiology

## 2014-08-27 ENCOUNTER — Encounter (HOSPITAL_COMMUNITY)
Admission: RE | Admit: 2014-08-27 | Discharge: 2014-08-27 | Disposition: A | Discharge status: Home / Self Care | Payer: Self-pay | Source: Ambulatory Visit | Attending: Cardiology | Admitting: Cardiology

## 2014-08-29 ENCOUNTER — Encounter (HOSPITAL_COMMUNITY): Payer: Self-pay

## 2014-08-31 ENCOUNTER — Encounter (HOSPITAL_COMMUNITY): Payer: Self-pay

## 2014-09-03 ENCOUNTER — Encounter (HOSPITAL_COMMUNITY)
Admission: RE | Admit: 2014-09-03 | Discharge: 2014-09-03 | Disposition: A | Discharge status: Home / Self Care | Payer: Self-pay | Source: Ambulatory Visit | Attending: Cardiology | Admitting: Cardiology

## 2014-09-04 DIAGNOSIS — H2513 Age-related nuclear cataract, bilateral: Secondary | ICD-10-CM | POA: Diagnosis not present

## 2014-09-04 DIAGNOSIS — H43813 Vitreous degeneration, bilateral: Secondary | ICD-10-CM | POA: Diagnosis not present

## 2014-09-04 DIAGNOSIS — H524 Presbyopia: Secondary | ICD-10-CM | POA: Diagnosis not present

## 2014-09-04 DIAGNOSIS — H0289 Other specified disorders of eyelid: Secondary | ICD-10-CM | POA: Diagnosis not present

## 2014-09-05 ENCOUNTER — Encounter (HOSPITAL_COMMUNITY): Payer: Self-pay

## 2014-09-06 DIAGNOSIS — Z01419 Encounter for gynecological examination (general) (routine) without abnormal findings: Secondary | ICD-10-CM | POA: Diagnosis not present

## 2014-09-06 DIAGNOSIS — Z779 Other contact with and (suspected) exposures hazardous to health: Secondary | ICD-10-CM | POA: Diagnosis not present

## 2014-09-06 DIAGNOSIS — Z77128 Contact with and (suspected) exposure to other hazards in the physical environment: Secondary | ICD-10-CM | POA: Diagnosis not present

## 2014-09-07 ENCOUNTER — Encounter (HOSPITAL_COMMUNITY)
Admission: RE | Admit: 2014-09-07 | Discharge: 2014-09-07 | Disposition: A | Discharge status: Home / Self Care | Payer: Self-pay | Source: Ambulatory Visit | Attending: Cardiology | Admitting: Cardiology

## 2014-09-10 ENCOUNTER — Encounter (HOSPITAL_COMMUNITY)
Admission: RE | Admit: 2014-09-10 | Discharge: 2014-09-10 | Disposition: A | Discharge status: Home / Self Care | Payer: Self-pay | Source: Ambulatory Visit | Attending: Cardiology | Admitting: Cardiology

## 2014-09-12 ENCOUNTER — Encounter (HOSPITAL_COMMUNITY): Payer: Self-pay

## 2014-09-17 ENCOUNTER — Ambulatory Visit (INDEPENDENT_AMBULATORY_CARE_PROVIDER_SITE_OTHER): Payer: Medicare Other

## 2014-09-17 ENCOUNTER — Encounter (HOSPITAL_COMMUNITY)
Admission: RE | Admit: 2014-09-17 | Discharge: 2014-09-17 | Disposition: A | Discharge status: Home / Self Care | Payer: Self-pay | Source: Ambulatory Visit | Attending: Cardiology | Admitting: Cardiology

## 2014-09-17 DIAGNOSIS — Z9889 Other specified postprocedural states: Secondary | ICD-10-CM

## 2014-09-17 DIAGNOSIS — Z954 Presence of other heart-valve replacement: Secondary | ICD-10-CM | POA: Diagnosis not present

## 2014-09-17 DIAGNOSIS — Z5181 Encounter for therapeutic drug level monitoring: Secondary | ICD-10-CM | POA: Diagnosis not present

## 2014-09-17 DIAGNOSIS — I509 Heart failure, unspecified: Secondary | ICD-10-CM | POA: Diagnosis not present

## 2014-09-17 DIAGNOSIS — Z7901 Long term (current) use of anticoagulants: Secondary | ICD-10-CM

## 2014-09-17 DIAGNOSIS — I4891 Unspecified atrial fibrillation: Secondary | ICD-10-CM | POA: Diagnosis not present

## 2014-09-17 DIAGNOSIS — Z952 Presence of prosthetic heart valve: Secondary | ICD-10-CM

## 2014-09-17 DIAGNOSIS — Z8679 Personal history of other diseases of the circulatory system: Secondary | ICD-10-CM

## 2014-09-17 LAB — POCT INR: INR: 3.7

## 2014-09-19 ENCOUNTER — Encounter (HOSPITAL_COMMUNITY): Payer: Medicare Other

## 2014-09-19 DIAGNOSIS — I059 Rheumatic mitral valve disease, unspecified: Secondary | ICD-10-CM | POA: Insufficient documentation

## 2014-09-19 DIAGNOSIS — Z5189 Encounter for other specified aftercare: Secondary | ICD-10-CM | POA: Insufficient documentation

## 2014-09-19 DIAGNOSIS — I4891 Unspecified atrial fibrillation: Secondary | ICD-10-CM | POA: Insufficient documentation

## 2014-09-19 DIAGNOSIS — I279 Pulmonary heart disease, unspecified: Secondary | ICD-10-CM | POA: Insufficient documentation

## 2014-09-21 ENCOUNTER — Encounter (HOSPITAL_COMMUNITY)
Admission: RE | Admit: 2014-09-21 | Discharge: 2014-09-21 | Disposition: A | Discharge status: Home / Self Care | Payer: Self-pay | Source: Ambulatory Visit | Attending: Cardiology | Admitting: Cardiology

## 2014-09-21 ENCOUNTER — Telehealth: Payer: Self-pay | Admitting: Cardiology

## 2014-09-21 ENCOUNTER — Other Ambulatory Visit: Payer: Self-pay | Admitting: *Deleted

## 2014-09-21 MED ORDER — METOPROLOL TARTRATE 25 MG PO TABS: 25.0000 mg | ORAL_TABLET | Freq: Two times a day (BID) | ORAL | Status: DC

## 2014-09-21 NOTE — Telephone Encounter (Signed)
error 

## 2014-09-21 NOTE — Telephone Encounter (Signed)
New message    Refill metoprolol tartrate 25mg  --1 tablet bid[facility]

## 2014-09-24 ENCOUNTER — Encounter (HOSPITAL_COMMUNITY)
Admission: RE | Admit: 2014-09-24 | Discharge: 2014-09-24 | Disposition: A | Discharge status: Home / Self Care | Payer: Self-pay | Source: Ambulatory Visit | Attending: Cardiology | Admitting: Cardiology

## 2014-09-26 ENCOUNTER — Encounter: Payer: Self-pay | Admitting: Cardiology

## 2014-09-26 ENCOUNTER — Ambulatory Visit (INDEPENDENT_AMBULATORY_CARE_PROVIDER_SITE_OTHER): Payer: Medicare Other | Admitting: Cardiology

## 2014-09-26 ENCOUNTER — Encounter (HOSPITAL_COMMUNITY): Payer: Self-pay

## 2014-09-26 VITALS — BP 98/62 | HR 79 | Ht 69.5 in | Wt 145.0 lb

## 2014-09-26 DIAGNOSIS — I480 Paroxysmal atrial fibrillation: Secondary | ICD-10-CM | POA: Diagnosis not present

## 2014-09-26 DIAGNOSIS — Z952 Presence of prosthetic heart valve: Secondary | ICD-10-CM

## 2014-09-26 DIAGNOSIS — Z954 Presence of other heart-valve replacement: Secondary | ICD-10-CM

## 2014-09-26 DIAGNOSIS — R002 Palpitations: Secondary | ICD-10-CM | POA: Diagnosis not present

## 2014-09-26 NOTE — Assessment & Plan Note (Signed)
She is doing very well after the mitral valve replacement with a mechanical prosthesis January, 2014. She does not need any further testing at this time.

## 2014-09-26 NOTE — Progress Notes (Signed)
Patient ID: [name] [name], female   DOB: [birth year], 63 y.o.   MRN: [id number]    HPI Patient is seen today to follow-up her history of mitral valve replacement. She's doing very well overall. She had a maze procedure. She has been maintaining sinus rhythm. She thinks she had a brief episode of irregular beat that may of been atrial fib. This was very limited. She is anticoagulated for her mechanical prosthesis. Allergies  Allergen Reactions  . Erythromycin Nausea And Vomiting    Current Outpatient Prescriptions  Medication Sig Dispense Refill  . ARMOUR THYROID 15 MG tablet 15 mg. ALTERNATE 60 MG ONE DAY AND THE NEXT DAY IS 45MG  FOR THE WEEK  0  . Black Cohosh 40 MG CAPS Take 1 capsule by mouth daily.    . Bromelains (BROMELAIN PO) Take 1 capsule by mouth 2 (two) times daily.    . Cholecalciferol (VITAMIN D) 2000 UNITS CAPS Take 6,000 Units by mouth daily.    . Coenzyme Q10 (CO Q-10) 100 MG CAPS Take 1 capsule by mouth daily.     . Cranberry 500 MG CAPS Take 1 capsule by mouth daily.    Marland Kitchen L-THEANINE PO Take 1 capsule by mouth daily.     Marland Kitchen lactose free nutrition (BOOST PLUS) LIQD Take 237 mLs by mouth daily.     Marland Kitchen Lysine 500 MG CAPS Take 1 capsule by mouth daily.    . metoprolol tartrate (LOPRESSOR) 25 MG tablet Take 1 tablet (25 mg total) by mouth 2 (two) times daily. 60 tablet 6  . MILK THISTLE PO Take 1 capsule by mouth 2 (two) times daily.    . Misc Natural Products (GLUCOSAMINE CHOND COMPLEX/MSM PO) Strength of dose Glucosamine 1500mg  /Chodroitron 1000mg / MSM 500mg  takes one twice daily    . Olive Leaf 500 MG CAPS Take 1 capsule by mouth 2 (two) times daily.     Marland Kitchen OVER THE COUNTER MEDICATION Red marine algae - 2 capsules daily    . Probiotic Product (PROBIOTIC DAILY PO) Take 1 capsule once a day    . PROGESTERONE MICRONIZED PO Take 50 mg by mouth 2 (two) times daily.     Marland Kitchen thyroid (ARMOUR) 60 MG tablet Take 60 mg by mouth daily. Alternating 60mg  and 45mg  QOD    . vitamin A 10000  UNIT capsule Take 10,000 Units by mouth daily.    . vitamin E 400 UNIT capsule Take 400 Units by mouth daily.    Marland Kitchen warfarin (COUMADIN) 5 MG tablet Take 1 tablet (5 mg total) by mouth as directed. (Patient taking differently: Take 5 mg by mouth as directed. 7.5 MG ON MWF, REST OF WEEK IS 5 MG.) 120 tablet 1   No current facility-administered medications for this visit.    History   Social History  . Marital Status: Widowed    Spouse Name: N/A    Number of Children: N/A  . Years of Education: N/A   Occupational History  . Not on file.   Social History Main Topics  . Smoking status: Never Smoker   . Smokeless tobacco: Never Used  . Alcohol Use: No  . Drug Use: No  . Sexual Activity: Not on file   Other Topics Concern  . Not on file   Social History Narrative    Family History  Problem Relation Age of Onset  . CAD    . Heart disease Father     cardiac arrest   . CAD Mother  5 stents and numerous bypass surgery    Past Medical History  Diagnosis Date  . Multiple sclerosis   . Breast cancer   . Ovarian cyst   . Hypothyroidism   . Mitral valve regurgitation     Mitral valve replacement April, 2013, Mitral valve prolapse  . CHF (congestive heart failure)     Related to severe mitral regurgitation, April, 2013  . Shortness of breath     Related to severe mitral regurgitation, April, 2013  . Paroxysmal atrial fibrillation     Rapid atrial fibrillation in-hospital, Rapid cardioversion,  before mitral valve surgery  . Neuromuscular disorder     ms  . Arthritis     knees  . S/P mitral valve replacement 01/26/2012    45mm Sorin Carbomedics Optiform mechanical prosthesis via right mini thoracotomy  . S/P Maze operation for atrial fibrillation 01/26/2012    Complete biatrial lesion set using cryothermy via right mini thoracotomy  . Pulmonary hypertension     Echo, April, 2013, before mitral valve surgery  . 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 Assess by pulmonary team in the hospital April, 2013  . Anxiety   . Neurogenic bladder   . Ejection fraction     EF 60%, echo, April, 2013, with severe MR before mitral valve replacement  . Warfarin anticoagulation     Mechanical mitral prosthesis, April, 20136    Past Surgical History  Procedure Laterality Date  . Breast lumpectomy    . Lymphadenectomy    . Wrist surgery    . Hernia repair    . Tonsillectomy    . Cystoscopy    . Laparoscopy    . Tee without cardioversion  01/19/2012    Procedure: TRANSESOPHAGEAL ECHOCARDIOGRAM (TEE);  Surgeon: Peter M Martinique, MD;  Location: [facility] ENDOSCOPY;  Service: Cardiovascular;  Laterality: N/A;  . Maze  01/26/2012    Procedure: MAZE;  Surgeon: Rexene Alberts, MD;  Location: [place];  Service: Open Heart Surgery;  Laterality: N/A;  . Mitral valve replacement  01/26/2012    Procedure: MINIMALLY INVASIVE MITRAL VALVE (MV) REPLACEMENT;  Surgeon: Rexene Alberts, MD;  Location: [place];  Service: Open Heart Surgery;  Laterality: Right;  . Chest tube insertion  01/26/2012    Procedure: CHEST TUBE INSERTION;  Surgeon: Rexene Alberts, MD;  Location: [place];  Service: Open Heart Surgery;  Laterality: Left;    Patient Active Problem List   Diagnosis Date Noted  . Encounter for therapeutic drug monitoring 11/27/2013  . Palpitations 09/20/2013  . Mitral valve regurgitation   . Breast cancer   . Hypothyroidism   . CHF (congestive heart failure)   . Shortness of breath   . Atrial fibrillation   . Neuromuscular disorder   . Arthritis   . COPD (chronic obstructive pulmonary disease)   . Neurogenic bladder   . Ejection fraction   . Warfarin anticoagulation   . First degree heart block 01/29/2012  . 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 S/P Maze operation for atrial fibrillation 01/26/2012  . 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 Hypokalemia 01/19/2012  . Anxiety 01/18/2012  . Tachycardia 01/18/2012  . Pulmonary hypertension 01/18/2012  .  Methylprednisolone-induced hypoxia 01/18/2012  . Multiple sclerosis 01/19/2007  . 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 OSTEOPENIA 01/19/2007  . URINARY INCONTINENCE 01/19/2007  . COLONOSCOPY, HX OF 01/19/2007  . GENITAL HERPES, HX OF 01/19/2007  . BREAST CANCER, HX OF 06/19/1998    ROS  the patient denies fever, chills, headache, sweats, rash, change in vision, change in hearing, chest pain, cough, nausea or vomiting, urinary symptoms. All other systems are reviewed and are negative.  PHYSICAL EXAM  patient is stable today. Head is atraumatic. Sclera and conjunctiva are normal. There is no jugulovenous distention. Lungs are clear. Respiratory effort is not labored. Cardiac exam reveals S1 and S2. The mitral valve closure sound is crisp. Abdomen is soft. There is no peripheral edema. The patient's body habitus suggestive of Marfan's. No edema.  Filed Vitals:   09/26/14 1148  BP: 98/62  Pulse: 79  Height: 5' 9.5" (1.765 m)  Weight: 145 lb (65.772 kg)  SpO2: 98%    EKG is done today and reviewed by me. There is sinus rhythm.   ASSESSMENT & PLAN

## 2014-09-26 NOTE — Assessment & Plan Note (Signed)
The patient may have had a brief episode of atrial fibrillation in the past several weeks. It was quite limited. There's been no recurrence. She is already anticoagulated. I'll not change her meds or do any further workup at this time.

## 2014-09-26 NOTE — Patient Instructions (Signed)
Your physician recommends that you continue on your current medications as directed. Please refer to the Current Medication list given to you today.  Your physician wants you to follow-up in: 6 months. You will receive a reminder letter in the mail two months in advance. If you don't receive a letter, please call our office to schedule the follow-up appointment.  

## 2014-09-27 ENCOUNTER — Encounter (HOSPITAL_COMMUNITY): Payer: Self-pay | Admitting: Cardiovascular Disease

## 2014-09-27 DIAGNOSIS — R7879 Finding of abnormal level of heavy metals in blood: Secondary | ICD-10-CM | POA: Diagnosis not present

## 2014-09-27 DIAGNOSIS — E039 Hypothyroidism, unspecified: Secondary | ICD-10-CM | POA: Diagnosis not present

## 2014-09-27 DIAGNOSIS — G35 Multiple sclerosis: Secondary | ICD-10-CM | POA: Diagnosis not present

## 2014-09-27 DIAGNOSIS — D225 Melanocytic nevi of trunk: Secondary | ICD-10-CM | POA: Diagnosis not present

## 2014-09-27 DIAGNOSIS — L814 Other melanin hyperpigmentation: Secondary | ICD-10-CM | POA: Diagnosis not present

## 2014-09-27 DIAGNOSIS — L821 Other seborrheic keratosis: Secondary | ICD-10-CM | POA: Diagnosis not present

## 2014-09-27 DIAGNOSIS — R7989 Other specified abnormal findings of blood chemistry: Secondary | ICD-10-CM | POA: Diagnosis not present

## 2014-09-27 DIAGNOSIS — E559 Vitamin D deficiency, unspecified: Secondary | ICD-10-CM | POA: Diagnosis not present

## 2014-09-28 ENCOUNTER — Encounter (HOSPITAL_COMMUNITY)
Admission: RE | Admit: 2014-09-28 | Discharge: 2014-09-28 | Disposition: A | Discharge status: Home / Self Care | Payer: Self-pay | Source: Ambulatory Visit | Attending: Cardiology | Admitting: Cardiology

## 2014-10-01 ENCOUNTER — Encounter (HOSPITAL_COMMUNITY)
Admission: RE | Admit: 2014-10-01 | Discharge: 2014-10-01 | Disposition: A | Discharge status: Home / Self Care | Payer: Self-pay | Source: Ambulatory Visit | Attending: Cardiology | Admitting: Cardiology

## 2014-10-03 ENCOUNTER — Encounter (HOSPITAL_COMMUNITY): Payer: Self-pay

## 2014-10-05 ENCOUNTER — Encounter (HOSPITAL_COMMUNITY)
Admission: RE | Admit: 2014-10-05 | Discharge: 2014-10-05 | Disposition: A | Discharge status: Home / Self Care | Payer: Self-pay | Source: Ambulatory Visit | Attending: Cardiology | Admitting: Cardiology

## 2014-10-08 ENCOUNTER — Encounter (HOSPITAL_COMMUNITY)
Admission: RE | Admit: 2014-10-08 | Discharge: 2014-10-08 | Disposition: A | Discharge status: Home / Self Care | Payer: Self-pay | Source: Ambulatory Visit | Attending: Cardiology | Admitting: Cardiology

## 2014-10-08 DIAGNOSIS — G35 Multiple sclerosis: Secondary | ICD-10-CM | POA: Diagnosis not present

## 2014-10-10 ENCOUNTER — Encounter (HOSPITAL_COMMUNITY): Payer: Self-pay

## 2014-10-10 ENCOUNTER — Ambulatory Visit (INDEPENDENT_AMBULATORY_CARE_PROVIDER_SITE_OTHER): Payer: Medicare Other | Admitting: *Deleted

## 2014-10-10 DIAGNOSIS — I4891 Unspecified atrial fibrillation: Secondary | ICD-10-CM | POA: Diagnosis not present

## 2014-10-10 DIAGNOSIS — I509 Heart failure, unspecified: Secondary | ICD-10-CM

## 2014-10-10 DIAGNOSIS — Z9889 Other specified postprocedural states: Secondary | ICD-10-CM

## 2014-10-10 DIAGNOSIS — Z954 Presence of other heart-valve replacement: Secondary | ICD-10-CM | POA: Diagnosis not present

## 2014-10-10 DIAGNOSIS — Z780 Asymptomatic menopausal state: Secondary | ICD-10-CM | POA: Diagnosis not present

## 2014-10-10 DIAGNOSIS — Z8679 Personal history of other diseases of the circulatory system: Secondary | ICD-10-CM

## 2014-10-10 DIAGNOSIS — Z7901 Long term (current) use of anticoagulants: Secondary | ICD-10-CM

## 2014-10-10 DIAGNOSIS — Z5181 Encounter for therapeutic drug level monitoring: Secondary | ICD-10-CM

## 2014-10-10 DIAGNOSIS — Z952 Presence of prosthetic heart valve: Secondary | ICD-10-CM

## 2014-10-10 DIAGNOSIS — Z853 Personal history of malignant neoplasm of breast: Secondary | ICD-10-CM | POA: Diagnosis not present

## 2014-10-10 DIAGNOSIS — Z1231 Encounter for screening mammogram for malignant neoplasm of breast: Secondary | ICD-10-CM | POA: Diagnosis not present

## 2014-10-10 LAB — POCT INR: INR: 3.3

## 2014-10-15 ENCOUNTER — Encounter (HOSPITAL_COMMUNITY)
Admission: RE | Admit: 2014-10-15 | Discharge: 2014-10-15 | Disposition: A | Discharge status: Home / Self Care | Payer: Self-pay | Source: Ambulatory Visit | Attending: Cardiology | Admitting: Cardiology

## 2014-10-17 ENCOUNTER — Encounter (HOSPITAL_COMMUNITY): Payer: Self-pay

## 2014-10-22 ENCOUNTER — Encounter (HOSPITAL_COMMUNITY)
Admission: RE | Admit: 2014-10-22 | Discharge: 2014-10-22 | Disposition: A | Discharge status: Home / Self Care | Payer: Self-pay | Source: Ambulatory Visit | Attending: Cardiology | Admitting: Cardiology

## 2014-10-22 DIAGNOSIS — I279 Pulmonary heart disease, unspecified: Secondary | ICD-10-CM | POA: Insufficient documentation

## 2014-10-22 DIAGNOSIS — I059 Rheumatic mitral valve disease, unspecified: Secondary | ICD-10-CM | POA: Insufficient documentation

## 2014-10-22 DIAGNOSIS — Z5189 Encounter for other specified aftercare: Secondary | ICD-10-CM | POA: Insufficient documentation

## 2014-10-22 DIAGNOSIS — I4891 Unspecified atrial fibrillation: Secondary | ICD-10-CM | POA: Insufficient documentation

## 2014-10-24 ENCOUNTER — Encounter (HOSPITAL_COMMUNITY): Payer: Self-pay

## 2014-10-25 DIAGNOSIS — M170 Bilateral primary osteoarthritis of knee: Secondary | ICD-10-CM | POA: Diagnosis not present

## 2014-10-25 DIAGNOSIS — M25562 Pain in left knee: Secondary | ICD-10-CM | POA: Diagnosis not present

## 2014-10-25 DIAGNOSIS — M25561 Pain in right knee: Secondary | ICD-10-CM | POA: Diagnosis not present

## 2014-10-26 ENCOUNTER — Encounter (HOSPITAL_COMMUNITY)
Admission: RE | Admit: 2014-10-26 | Discharge: 2014-10-26 | Disposition: A | Discharge status: Home / Self Care | Payer: Self-pay | Source: Ambulatory Visit | Attending: Cardiology | Admitting: Cardiology

## 2014-10-29 ENCOUNTER — Encounter (HOSPITAL_COMMUNITY)
Admission: RE | Admit: 2014-10-29 | Discharge: 2014-10-29 | Disposition: A | Discharge status: Home / Self Care | Payer: Self-pay | Source: Ambulatory Visit | Attending: Cardiology | Admitting: Cardiology

## 2014-10-31 ENCOUNTER — Encounter (HOSPITAL_COMMUNITY): Payer: Self-pay

## 2014-10-31 DIAGNOSIS — N319 Neuromuscular dysfunction of bladder, unspecified: Secondary | ICD-10-CM | POA: Diagnosis not present

## 2014-10-31 DIAGNOSIS — R350 Frequency of micturition: Secondary | ICD-10-CM | POA: Diagnosis not present

## 2014-10-31 DIAGNOSIS — N3946 Mixed incontinence: Secondary | ICD-10-CM | POA: Diagnosis not present

## 2014-11-02 ENCOUNTER — Encounter (HOSPITAL_COMMUNITY)
Admission: RE | Admit: 2014-11-02 | Discharge: 2014-11-02 | Disposition: A | Discharge status: Home / Self Care | Payer: Self-pay | Source: Ambulatory Visit | Attending: Cardiology | Admitting: Cardiology

## 2014-11-05 ENCOUNTER — Encounter (HOSPITAL_COMMUNITY): Payer: Self-pay

## 2014-11-05 ENCOUNTER — Encounter: Payer: Self-pay | Admitting: Cardiology

## 2014-11-07 ENCOUNTER — Encounter (HOSPITAL_COMMUNITY): Payer: Self-pay

## 2014-11-09 ENCOUNTER — Encounter (HOSPITAL_COMMUNITY): Payer: Self-pay

## 2014-11-12 ENCOUNTER — Encounter (HOSPITAL_COMMUNITY)
Admission: RE | Admit: 2014-11-12 | Discharge: 2014-11-12 | Disposition: A | Discharge status: Home / Self Care | Payer: Self-pay | Source: Ambulatory Visit | Attending: Cardiology | Admitting: Cardiology

## 2014-11-12 ENCOUNTER — Ambulatory Visit (INDEPENDENT_AMBULATORY_CARE_PROVIDER_SITE_OTHER): Payer: Medicare Other

## 2014-11-12 DIAGNOSIS — I4891 Unspecified atrial fibrillation: Secondary | ICD-10-CM

## 2014-11-12 DIAGNOSIS — Z7901 Long term (current) use of anticoagulants: Secondary | ICD-10-CM | POA: Diagnosis not present

## 2014-11-12 DIAGNOSIS — Z9889 Other specified postprocedural states: Secondary | ICD-10-CM

## 2014-11-12 DIAGNOSIS — Z5181 Encounter for therapeutic drug level monitoring: Secondary | ICD-10-CM | POA: Diagnosis not present

## 2014-11-12 DIAGNOSIS — Z952 Presence of prosthetic heart valve: Secondary | ICD-10-CM

## 2014-11-12 DIAGNOSIS — Z8679 Personal history of other diseases of the circulatory system: Secondary | ICD-10-CM

## 2014-11-12 DIAGNOSIS — Z954 Presence of other heart-valve replacement: Secondary | ICD-10-CM

## 2014-11-12 LAB — POCT INR: INR: 3

## 2014-11-14 ENCOUNTER — Encounter (HOSPITAL_COMMUNITY): Payer: Self-pay

## 2014-11-14 DIAGNOSIS — G35 Multiple sclerosis: Secondary | ICD-10-CM | POA: Diagnosis not present

## 2014-11-16 ENCOUNTER — Encounter (HOSPITAL_COMMUNITY)
Admission: RE | Admit: 2014-11-16 | Discharge: 2014-11-16 | Disposition: A | Discharge status: Home / Self Care | Payer: Self-pay | Source: Ambulatory Visit | Attending: Cardiology | Admitting: Cardiology

## 2014-11-19 ENCOUNTER — Encounter (HOSPITAL_COMMUNITY)
Admission: RE | Admit: 2014-11-19 | Discharge: 2014-11-19 | Disposition: A | Discharge status: Home / Self Care | Payer: Self-pay | Source: Ambulatory Visit | Attending: Cardiology | Admitting: Cardiology

## 2014-11-19 DIAGNOSIS — I4891 Unspecified atrial fibrillation: Secondary | ICD-10-CM | POA: Insufficient documentation

## 2014-11-19 DIAGNOSIS — Z5189 Encounter for other specified aftercare: Secondary | ICD-10-CM | POA: Insufficient documentation

## 2014-11-19 DIAGNOSIS — I059 Rheumatic mitral valve disease, unspecified: Secondary | ICD-10-CM | POA: Insufficient documentation

## 2014-11-19 DIAGNOSIS — I279 Pulmonary heart disease, unspecified: Secondary | ICD-10-CM | POA: Insufficient documentation

## 2014-11-21 ENCOUNTER — Encounter (HOSPITAL_COMMUNITY): Payer: Self-pay

## 2014-11-23 ENCOUNTER — Encounter (HOSPITAL_COMMUNITY)
Admission: RE | Admit: 2014-11-23 | Discharge: 2014-11-23 | Disposition: A | Discharge status: Home / Self Care | Payer: Self-pay | Source: Ambulatory Visit | Attending: Cardiology | Admitting: Cardiology

## 2014-11-26 ENCOUNTER — Encounter (HOSPITAL_COMMUNITY)
Admission: RE | Admit: 2014-11-26 | Discharge: 2014-11-26 | Disposition: A | Discharge status: Home / Self Care | Payer: Self-pay | Source: Ambulatory Visit | Attending: Cardiology | Admitting: Cardiology

## 2014-11-28 ENCOUNTER — Encounter (HOSPITAL_COMMUNITY): Payer: Self-pay

## 2014-11-30 ENCOUNTER — Encounter (HOSPITAL_COMMUNITY): Payer: Self-pay

## 2014-12-03 ENCOUNTER — Encounter (HOSPITAL_COMMUNITY): Payer: Self-pay

## 2014-12-05 ENCOUNTER — Encounter (HOSPITAL_COMMUNITY): Payer: Self-pay

## 2014-12-07 ENCOUNTER — Encounter (HOSPITAL_COMMUNITY)
Admission: RE | Admit: 2014-12-07 | Discharge: 2014-12-07 | Disposition: A | Discharge status: Home / Self Care | Payer: Self-pay | Source: Ambulatory Visit | Attending: Cardiology | Admitting: Cardiology

## 2014-12-09 DIAGNOSIS — N951 Menopausal and female climacteric states: Secondary | ICD-10-CM | POA: Diagnosis not present

## 2014-12-10 ENCOUNTER — Encounter (HOSPITAL_COMMUNITY)
Admission: RE | Admit: 2014-12-10 | Discharge: 2014-12-10 | Disposition: A | Discharge status: Home / Self Care | Payer: Self-pay | Source: Ambulatory Visit | Attending: Cardiology | Admitting: Cardiology

## 2014-12-10 ENCOUNTER — Ambulatory Visit (INDEPENDENT_AMBULATORY_CARE_PROVIDER_SITE_OTHER): Payer: Medicare Other | Admitting: *Deleted

## 2014-12-10 DIAGNOSIS — Z7901 Long term (current) use of anticoagulants: Secondary | ICD-10-CM

## 2014-12-10 DIAGNOSIS — Z9889 Other specified postprocedural states: Secondary | ICD-10-CM

## 2014-12-10 DIAGNOSIS — Z5181 Encounter for therapeutic drug level monitoring: Secondary | ICD-10-CM | POA: Diagnosis not present

## 2014-12-10 DIAGNOSIS — Z8679 Personal history of other diseases of the circulatory system: Secondary | ICD-10-CM

## 2014-12-10 DIAGNOSIS — I4891 Unspecified atrial fibrillation: Secondary | ICD-10-CM | POA: Diagnosis not present

## 2014-12-10 DIAGNOSIS — Z954 Presence of other heart-valve replacement: Secondary | ICD-10-CM

## 2014-12-10 DIAGNOSIS — Z952 Presence of prosthetic heart valve: Secondary | ICD-10-CM

## 2014-12-10 LAB — POCT INR: INR: 2.7

## 2014-12-12 ENCOUNTER — Encounter (HOSPITAL_COMMUNITY): Payer: Self-pay

## 2014-12-13 DIAGNOSIS — N3946 Mixed incontinence: Secondary | ICD-10-CM | POA: Diagnosis not present

## 2014-12-13 DIAGNOSIS — R350 Frequency of micturition: Secondary | ICD-10-CM | POA: Diagnosis not present

## 2014-12-13 DIAGNOSIS — N319 Neuromuscular dysfunction of bladder, unspecified: Secondary | ICD-10-CM | POA: Diagnosis not present

## 2014-12-13 DIAGNOSIS — N3941 Urge incontinence: Secondary | ICD-10-CM | POA: Diagnosis not present

## 2014-12-14 ENCOUNTER — Encounter (HOSPITAL_COMMUNITY)
Admission: RE | Admit: 2014-12-14 | Discharge: 2014-12-14 | Disposition: A | Discharge status: Home / Self Care | Payer: Self-pay | Source: Ambulatory Visit | Attending: Cardiology | Admitting: Cardiology

## 2014-12-17 ENCOUNTER — Encounter (HOSPITAL_COMMUNITY)
Admission: RE | Admit: 2014-12-17 | Discharge: 2014-12-17 | Disposition: A | Discharge status: Home / Self Care | Payer: Self-pay | Source: Ambulatory Visit | Attending: Cardiology | Admitting: Cardiology

## 2014-12-20 DIAGNOSIS — R350 Frequency of micturition: Secondary | ICD-10-CM | POA: Diagnosis not present

## 2014-12-20 DIAGNOSIS — N3941 Urge incontinence: Secondary | ICD-10-CM | POA: Diagnosis not present

## 2014-12-21 ENCOUNTER — Encounter (HOSPITAL_COMMUNITY): Payer: Self-pay

## 2014-12-24 ENCOUNTER — Encounter (HOSPITAL_COMMUNITY)
Admission: RE | Admit: 2014-12-24 | Discharge: 2014-12-24 | Disposition: A | Discharge status: Home / Self Care | Payer: Self-pay | Source: Ambulatory Visit | Attending: Cardiology | Admitting: Cardiology

## 2014-12-26 ENCOUNTER — Encounter (HOSPITAL_COMMUNITY): Payer: Self-pay

## 2014-12-27 DIAGNOSIS — R350 Frequency of micturition: Secondary | ICD-10-CM | POA: Diagnosis not present

## 2014-12-27 DIAGNOSIS — N3941 Urge incontinence: Secondary | ICD-10-CM | POA: Diagnosis not present

## 2014-12-28 ENCOUNTER — Encounter (HOSPITAL_COMMUNITY)
Admission: RE | Admit: 2014-12-28 | Discharge: 2014-12-28 | Disposition: A | Discharge status: Home / Self Care | Payer: Self-pay | Source: Ambulatory Visit | Attending: Cardiology | Admitting: Cardiology

## 2014-12-31 ENCOUNTER — Encounter (HOSPITAL_COMMUNITY)
Admission: RE | Admit: 2014-12-31 | Discharge: 2014-12-31 | Disposition: A | Discharge status: Home / Self Care | Payer: Self-pay | Source: Ambulatory Visit | Attending: Cardiology | Admitting: Cardiology

## 2015-01-02 ENCOUNTER — Encounter (HOSPITAL_COMMUNITY): Payer: Self-pay

## 2015-01-02 DIAGNOSIS — G35 Multiple sclerosis: Secondary | ICD-10-CM | POA: Diagnosis not present

## 2015-01-03 DIAGNOSIS — N3941 Urge incontinence: Secondary | ICD-10-CM | POA: Diagnosis not present

## 2015-01-03 DIAGNOSIS — R350 Frequency of micturition: Secondary | ICD-10-CM | POA: Diagnosis not present

## 2015-01-04 ENCOUNTER — Encounter (HOSPITAL_COMMUNITY)
Admission: RE | Admit: 2015-01-04 | Discharge: 2015-01-04 | Disposition: A | Discharge status: Home / Self Care | Payer: Self-pay | Source: Ambulatory Visit | Attending: Cardiology | Admitting: Cardiology

## 2015-01-07 ENCOUNTER — Encounter (HOSPITAL_COMMUNITY)
Admission: RE | Admit: 2015-01-07 | Discharge: 2015-01-07 | Disposition: A | Discharge status: Home / Self Care | Payer: Self-pay | Source: Ambulatory Visit | Attending: Cardiology | Admitting: Cardiology

## 2015-01-07 ENCOUNTER — Ambulatory Visit (INDEPENDENT_AMBULATORY_CARE_PROVIDER_SITE_OTHER): Payer: Medicare Other | Admitting: *Deleted

## 2015-01-07 DIAGNOSIS — I509 Heart failure, unspecified: Secondary | ICD-10-CM | POA: Diagnosis not present

## 2015-01-07 DIAGNOSIS — Z7901 Long term (current) use of anticoagulants: Secondary | ICD-10-CM | POA: Diagnosis not present

## 2015-01-07 DIAGNOSIS — Z9889 Other specified postprocedural states: Secondary | ICD-10-CM

## 2015-01-07 DIAGNOSIS — Z954 Presence of other heart-valve replacement: Secondary | ICD-10-CM

## 2015-01-07 DIAGNOSIS — I4891 Unspecified atrial fibrillation: Secondary | ICD-10-CM | POA: Diagnosis not present

## 2015-01-07 DIAGNOSIS — Z952 Presence of prosthetic heart valve: Secondary | ICD-10-CM

## 2015-01-07 DIAGNOSIS — Z5181 Encounter for therapeutic drug level monitoring: Secondary | ICD-10-CM

## 2015-01-07 DIAGNOSIS — Z8679 Personal history of other diseases of the circulatory system: Secondary | ICD-10-CM

## 2015-01-07 LAB — POCT INR: INR: 3.3

## 2015-01-09 ENCOUNTER — Encounter (HOSPITAL_COMMUNITY): Payer: Self-pay

## 2015-01-10 DIAGNOSIS — R350 Frequency of micturition: Secondary | ICD-10-CM | POA: Diagnosis not present

## 2015-01-10 DIAGNOSIS — N3941 Urge incontinence: Secondary | ICD-10-CM | POA: Diagnosis not present

## 2015-01-11 ENCOUNTER — Encounter (HOSPITAL_COMMUNITY)
Admission: RE | Admit: 2015-01-11 | Discharge: 2015-01-11 | Disposition: A | Discharge status: Home / Self Care | Payer: Self-pay | Source: Ambulatory Visit | Attending: Cardiology | Admitting: Cardiology

## 2015-01-14 ENCOUNTER — Telehealth: Payer: Self-pay | Admitting: Family Medicine

## 2015-01-14 ENCOUNTER — Encounter (HOSPITAL_COMMUNITY)
Admission: RE | Admit: 2015-01-14 | Discharge: 2015-01-14 | Disposition: A | Discharge status: Home / Self Care | Payer: Self-pay | Source: Ambulatory Visit | Attending: Cardiology | Admitting: Cardiology

## 2015-01-14 NOTE — Telephone Encounter (Signed)
Caller name: Azelie, [place] Relation to pt: self  Call back number:[phone]   Reason for call:  Pt was last seen 02/19/10 with Dr. Etter Sjogren and would like to reestablish with [facility] but requesting to become a patient of Dr. Birdie Riddle. Pt has medicare and secondary AARP. Please advise

## 2015-01-14 NOTE — Telephone Encounter (Signed)
Please contact pt and determine whether she has any family members who are currently pt's or how she was referred to me (other known MD).  Unfortunately my panel is closed unless she has 1 of those 2 things.

## 2015-01-14 NOTE — Telephone Encounter (Signed)
That is up to Dr Birdie Riddle

## 2015-01-15 NOTE — Telephone Encounter (Signed)
Spoke with pt who denied having any family who sees provider and has not been referred by an MD. Advised PT that Dr. Birdie Riddle is currently not taking any new patients. Tried to set her up with another provider and she advised she would call back later . Pt currently sees [facility] and is unsure if our office is the correct fit for her since all of her medications come from her holistic doctor.

## 2015-01-16 ENCOUNTER — Encounter (HOSPITAL_COMMUNITY): Payer: Self-pay

## 2015-01-17 DIAGNOSIS — N3941 Urge incontinence: Secondary | ICD-10-CM | POA: Diagnosis not present

## 2015-01-17 DIAGNOSIS — R350 Frequency of micturition: Secondary | ICD-10-CM | POA: Diagnosis not present

## 2015-01-18 ENCOUNTER — Encounter (HOSPITAL_COMMUNITY)
Admission: RE | Admit: 2015-01-18 | Discharge: 2015-01-18 | Disposition: A | Discharge status: Home / Self Care | Payer: Self-pay | Source: Ambulatory Visit | Attending: Cardiology | Admitting: Cardiology

## 2015-01-18 DIAGNOSIS — Z952 Presence of prosthetic heart valve: Secondary | ICD-10-CM | POA: Insufficient documentation

## 2015-01-18 DIAGNOSIS — I340 Nonrheumatic mitral (valve) insufficiency: Secondary | ICD-10-CM | POA: Insufficient documentation

## 2015-01-21 ENCOUNTER — Telehealth: Payer: Self-pay | Admitting: Cardiology

## 2015-01-21 ENCOUNTER — Encounter (HOSPITAL_COMMUNITY)
Admission: RE | Admit: 2015-01-21 | Discharge: 2015-01-21 | Disposition: A | Discharge status: Home / Self Care | Payer: Self-pay | Source: Ambulatory Visit | Attending: Cardiology | Admitting: Cardiology

## 2015-01-21 NOTE — Telephone Encounter (Signed)
I can't tell exactly what the questions are here. However, the patient has a mechanical valve. Coumadin cannot be stopped for dental work. Antibiotic prophylaxis is to be used.

## 2015-01-21 NOTE — Telephone Encounter (Signed)
**Note De-identified Coady Train Obfuscation** Please advise 

## 2015-01-21 NOTE — Telephone Encounter (Signed)
1. What dental office are you calling from? Dr. Ivin [facility]  2. What is your office phone and fax number? Fax [phone]  3. What type of procedure is the patient having performed? Cleaning  4. What date is procedure scheduled? 01/24/15  5. What is your question (ex. Antibiotics prior to procedure, holding medication-we need to know how long dentist wants pt to hold med)? Wants to know if pt needs a pre med. Please call back and advise.  6.

## 2015-01-21 NOTE — Telephone Encounter (Signed)
error 

## 2015-01-23 ENCOUNTER — Encounter (HOSPITAL_COMMUNITY): Payer: Self-pay

## 2015-01-23 NOTE — Telephone Encounter (Signed)
F/u    Waiting on call concerning if pt need to take a pre med before cleaning. Please advise.

## 2015-01-23 NOTE — Telephone Encounter (Signed)
**Note De-Identified Emrys Mckamie Obfuscation** The pt is advised to take four 500 mg of Amoxicillin 1 hour prior to dental procedure. She verbalized understanding and per her request I called this RX into [facility] on [address].

## 2015-01-24 DIAGNOSIS — R350 Frequency of micturition: Secondary | ICD-10-CM | POA: Diagnosis not present

## 2015-01-24 DIAGNOSIS — N3941 Urge incontinence: Secondary | ICD-10-CM | POA: Diagnosis not present

## 2015-01-25 ENCOUNTER — Encounter (HOSPITAL_COMMUNITY)
Admission: RE | Admit: 2015-01-25 | Discharge: 2015-01-25 | Disposition: A | Discharge status: Home / Self Care | Payer: Self-pay | Source: Ambulatory Visit | Attending: Cardiology | Admitting: Cardiology

## 2015-01-28 ENCOUNTER — Encounter (HOSPITAL_COMMUNITY)
Admission: RE | Admit: 2015-01-28 | Discharge: 2015-01-28 | Disposition: A | Discharge status: Home / Self Care | Payer: Self-pay | Source: Ambulatory Visit | Attending: Cardiology | Admitting: Cardiology

## 2015-01-30 ENCOUNTER — Encounter (HOSPITAL_COMMUNITY): Payer: Self-pay

## 2015-02-01 ENCOUNTER — Encounter (HOSPITAL_COMMUNITY)
Admission: RE | Admit: 2015-02-01 | Discharge: 2015-02-01 | Disposition: A | Discharge status: Home / Self Care | Payer: Self-pay | Source: Ambulatory Visit | Attending: Cardiology | Admitting: Cardiology

## 2015-02-01 DIAGNOSIS — R350 Frequency of micturition: Secondary | ICD-10-CM | POA: Diagnosis not present

## 2015-02-01 DIAGNOSIS — N3941 Urge incontinence: Secondary | ICD-10-CM | POA: Diagnosis not present

## 2015-02-04 ENCOUNTER — Encounter (HOSPITAL_COMMUNITY)
Admission: RE | Admit: 2015-02-04 | Discharge: 2015-02-04 | Disposition: A | Discharge status: Home / Self Care | Payer: Self-pay | Source: Ambulatory Visit | Attending: Cardiology | Admitting: Cardiology

## 2015-02-06 ENCOUNTER — Encounter (HOSPITAL_COMMUNITY): Payer: Self-pay

## 2015-02-07 DIAGNOSIS — R350 Frequency of micturition: Secondary | ICD-10-CM | POA: Diagnosis not present

## 2015-02-07 DIAGNOSIS — G35 Multiple sclerosis: Secondary | ICD-10-CM | POA: Diagnosis not present

## 2015-02-07 DIAGNOSIS — N3941 Urge incontinence: Secondary | ICD-10-CM | POA: Diagnosis not present

## 2015-02-08 ENCOUNTER — Encounter (HOSPITAL_COMMUNITY)
Admission: RE | Admit: 2015-02-08 | Discharge: 2015-02-08 | Disposition: A | Discharge status: Home / Self Care | Payer: Self-pay | Source: Ambulatory Visit | Attending: Cardiology | Admitting: Cardiology

## 2015-02-11 ENCOUNTER — Encounter (HOSPITAL_COMMUNITY)
Admission: RE | Admit: 2015-02-11 | Discharge: 2015-02-11 | Disposition: A | Discharge status: Home / Self Care | Payer: Medicare Other | Source: Ambulatory Visit | Attending: Cardiology | Admitting: Cardiology

## 2015-02-13 ENCOUNTER — Encounter (HOSPITAL_COMMUNITY): Payer: Self-pay

## 2015-02-14 DIAGNOSIS — N3941 Urge incontinence: Secondary | ICD-10-CM | POA: Diagnosis not present

## 2015-02-15 ENCOUNTER — Encounter (HOSPITAL_COMMUNITY)
Admission: RE | Admit: 2015-02-15 | Discharge: 2015-02-15 | Disposition: A | Discharge status: Home / Self Care | Payer: Self-pay | Source: Ambulatory Visit | Attending: Cardiology | Admitting: Cardiology

## 2015-02-18 ENCOUNTER — Ambulatory Visit (INDEPENDENT_AMBULATORY_CARE_PROVIDER_SITE_OTHER): Payer: Medicare Other

## 2015-02-18 ENCOUNTER — Encounter (HOSPITAL_COMMUNITY)
Admission: RE | Admit: 2015-02-18 | Discharge: 2015-02-18 | Disposition: A | Discharge status: Home / Self Care | Payer: Self-pay | Source: Ambulatory Visit | Attending: Cardiology | Admitting: Cardiology

## 2015-02-18 DIAGNOSIS — Z7901 Long term (current) use of anticoagulants: Secondary | ICD-10-CM

## 2015-02-18 DIAGNOSIS — Z9889 Other specified postprocedural states: Secondary | ICD-10-CM

## 2015-02-18 DIAGNOSIS — Z954 Presence of other heart-valve replacement: Secondary | ICD-10-CM

## 2015-02-18 DIAGNOSIS — I509 Heart failure, unspecified: Secondary | ICD-10-CM | POA: Diagnosis not present

## 2015-02-18 DIAGNOSIS — Z5181 Encounter for therapeutic drug level monitoring: Secondary | ICD-10-CM

## 2015-02-18 DIAGNOSIS — Z8679 Personal history of other diseases of the circulatory system: Secondary | ICD-10-CM

## 2015-02-18 DIAGNOSIS — Z952 Presence of prosthetic heart valve: Secondary | ICD-10-CM

## 2015-02-18 DIAGNOSIS — I4891 Unspecified atrial fibrillation: Secondary | ICD-10-CM | POA: Diagnosis not present

## 2015-02-18 LAB — POCT INR: INR: 2.4

## 2015-02-20 ENCOUNTER — Encounter (HOSPITAL_COMMUNITY): Payer: Medicare Other

## 2015-02-21 DIAGNOSIS — N3941 Urge incontinence: Secondary | ICD-10-CM | POA: Diagnosis not present

## 2015-02-22 ENCOUNTER — Encounter (HOSPITAL_COMMUNITY)
Admission: RE | Admit: 2015-02-22 | Discharge: 2015-02-22 | Disposition: A | Discharge status: Home / Self Care | Payer: Self-pay | Source: Ambulatory Visit | Attending: Cardiology | Admitting: Cardiology

## 2015-02-25 ENCOUNTER — Encounter (HOSPITAL_COMMUNITY)
Admission: RE | Admit: 2015-02-25 | Discharge: 2015-02-25 | Disposition: A | Discharge status: Home / Self Care | Payer: Self-pay | Source: Ambulatory Visit | Attending: Cardiology | Admitting: Cardiology

## 2015-02-27 ENCOUNTER — Encounter (HOSPITAL_COMMUNITY): Payer: Self-pay

## 2015-02-27 ENCOUNTER — Ambulatory Visit: Payer: Medicare Other | Admitting: Podiatrist

## 2015-02-27 DIAGNOSIS — R7989 Other specified abnormal findings of blood chemistry: Secondary | ICD-10-CM | POA: Diagnosis not present

## 2015-02-27 DIAGNOSIS — G35 Multiple sclerosis: Secondary | ICD-10-CM | POA: Diagnosis not present

## 2015-02-27 DIAGNOSIS — E039 Hypothyroidism, unspecified: Secondary | ICD-10-CM | POA: Diagnosis not present

## 2015-02-27 DIAGNOSIS — E559 Vitamin D deficiency, unspecified: Secondary | ICD-10-CM | POA: Diagnosis not present

## 2015-02-28 DIAGNOSIS — N3941 Urge incontinence: Secondary | ICD-10-CM | POA: Diagnosis not present

## 2015-02-28 DIAGNOSIS — N3946 Mixed incontinence: Secondary | ICD-10-CM | POA: Diagnosis not present

## 2015-02-28 DIAGNOSIS — R350 Frequency of micturition: Secondary | ICD-10-CM | POA: Diagnosis not present

## 2015-03-01 ENCOUNTER — Encounter (HOSPITAL_COMMUNITY)
Admission: RE | Admit: 2015-03-01 | Discharge: 2015-03-01 | Disposition: A | Discharge status: Home / Self Care | Payer: Self-pay | Source: Ambulatory Visit | Attending: Cardiology | Admitting: Cardiology

## 2015-03-04 ENCOUNTER — Other Ambulatory Visit: Payer: Self-pay | Admitting: Cardiology

## 2015-03-04 ENCOUNTER — Encounter (HOSPITAL_COMMUNITY)
Admission: RE | Admit: 2015-03-04 | Discharge: 2015-03-04 | Disposition: A | Discharge status: Home / Self Care | Payer: Self-pay | Source: Ambulatory Visit | Attending: Cardiology | Admitting: Cardiology

## 2015-03-04 MED ORDER — WARFARIN SODIUM 5 MG PO TABS: ORAL_TABLET | ORAL | Status: DC

## 2015-03-04 NOTE — Telephone Encounter (Signed)
Refill done as requested 

## 2015-03-04 NOTE — Telephone Encounter (Signed)
New message      Ref[facility]in at [facility]

## 2015-03-06 ENCOUNTER — Encounter (HOSPITAL_COMMUNITY): Payer: Self-pay

## 2015-03-06 DIAGNOSIS — G35 Multiple sclerosis: Secondary | ICD-10-CM | POA: Diagnosis not present

## 2015-03-08 ENCOUNTER — Encounter (HOSPITAL_COMMUNITY)
Admission: RE | Admit: 2015-03-08 | Discharge: 2015-03-08 | Disposition: A | Discharge status: Home / Self Care | Payer: Self-pay | Source: Ambulatory Visit | Attending: Cardiology | Admitting: Cardiology

## 2015-03-11 ENCOUNTER — Encounter (HOSPITAL_COMMUNITY)
Admission: RE | Admit: 2015-03-11 | Discharge: 2015-03-11 | Disposition: A | Discharge status: Home / Self Care | Payer: Self-pay | Source: Ambulatory Visit | Attending: Cardiology | Admitting: Cardiology

## 2015-03-12 ENCOUNTER — Encounter: Payer: Self-pay | Admitting: Internal Medicine

## 2015-03-13 ENCOUNTER — Encounter (HOSPITAL_COMMUNITY): Payer: Self-pay

## 2015-03-15 ENCOUNTER — Encounter (HOSPITAL_COMMUNITY)
Admission: RE | Admit: 2015-03-15 | Discharge: 2015-03-15 | Disposition: A | Discharge status: Home / Self Care | Payer: Self-pay | Source: Ambulatory Visit | Attending: Cardiology | Admitting: Cardiology

## 2015-03-20 ENCOUNTER — Encounter (HOSPITAL_COMMUNITY): Payer: Self-pay

## 2015-03-20 DIAGNOSIS — I4891 Unspecified atrial fibrillation: Secondary | ICD-10-CM | POA: Insufficient documentation

## 2015-03-20 DIAGNOSIS — Z9889 Other specified postprocedural states: Secondary | ICD-10-CM | POA: Insufficient documentation

## 2015-03-20 DIAGNOSIS — Z954 Presence of other heart-valve replacement: Secondary | ICD-10-CM | POA: Insufficient documentation

## 2015-03-20 DIAGNOSIS — I509 Heart failure, unspecified: Secondary | ICD-10-CM | POA: Insufficient documentation

## 2015-03-20 DIAGNOSIS — Z7901 Long term (current) use of anticoagulants: Secondary | ICD-10-CM | POA: Insufficient documentation

## 2015-03-20 DIAGNOSIS — Z5181 Encounter for therapeutic drug level monitoring: Secondary | ICD-10-CM | POA: Insufficient documentation

## 2015-03-21 DIAGNOSIS — R350 Frequency of micturition: Secondary | ICD-10-CM | POA: Diagnosis not present

## 2015-03-21 DIAGNOSIS — N3941 Urge incontinence: Secondary | ICD-10-CM | POA: Diagnosis not present

## 2015-03-22 ENCOUNTER — Encounter (HOSPITAL_COMMUNITY)
Admission: RE | Admit: 2015-03-22 | Discharge: 2015-03-22 | Disposition: A | Discharge status: Home / Self Care | Payer: Self-pay | Source: Ambulatory Visit | Attending: Cardiology | Admitting: Cardiology

## 2015-03-25 ENCOUNTER — Ambulatory Visit (INDEPENDENT_AMBULATORY_CARE_PROVIDER_SITE_OTHER): Payer: Medicare Other | Admitting: Cardiology

## 2015-03-25 ENCOUNTER — Encounter: Payer: Self-pay | Admitting: Cardiology

## 2015-03-25 ENCOUNTER — Ambulatory Visit (INDEPENDENT_AMBULATORY_CARE_PROVIDER_SITE_OTHER): Payer: Medicare Other

## 2015-03-25 ENCOUNTER — Encounter (HOSPITAL_COMMUNITY)
Admission: RE | Admit: 2015-03-25 | Discharge: 2015-03-25 | Disposition: A | Discharge status: Home / Self Care | Payer: Medicare Other | Source: Ambulatory Visit | Attending: Cardiology | Admitting: Cardiology

## 2015-03-25 VITALS — BP 110/72 | HR 73 | Ht 69.5 in | Wt 146.1 lb

## 2015-03-25 DIAGNOSIS — Z8679 Personal history of other diseases of the circulatory system: Secondary | ICD-10-CM

## 2015-03-25 DIAGNOSIS — Z9889 Other specified postprocedural states: Secondary | ICD-10-CM

## 2015-03-25 DIAGNOSIS — Z5181 Encounter for therapeutic drug level monitoring: Secondary | ICD-10-CM

## 2015-03-25 DIAGNOSIS — I4891 Unspecified atrial fibrillation: Secondary | ICD-10-CM

## 2015-03-25 DIAGNOSIS — Z7901 Long term (current) use of anticoagulants: Secondary | ICD-10-CM | POA: Diagnosis not present

## 2015-03-25 DIAGNOSIS — Z954 Presence of other heart-valve replacement: Secondary | ICD-10-CM

## 2015-03-25 DIAGNOSIS — I509 Heart failure, unspecified: Secondary | ICD-10-CM

## 2015-03-25 DIAGNOSIS — Z952 Presence of prosthetic heart valve: Secondary | ICD-10-CM

## 2015-03-25 LAB — POCT INR: INR: 3

## 2015-03-25 MED ORDER — METOPROLOL TARTRATE 25 MG PO TABS: 25.0000 mg | ORAL_TABLET | Freq: Two times a day (BID) | ORAL | Status: DC

## 2015-03-25 NOTE — Progress Notes (Signed)
Cardiology Office Note   Date:  03/25/2015   ID:  [name] [name], DOB [birth year], MRN [id number]  PCP:  Elba Barman, MD  Cardiologist:  Dola Argyle, MD   Chief Complaint  Patient presents with  . Appointment    Follow-up mitral regurgitation      History of Present Illness: [name] [name] is a 64 y.o. female who presents to follow up her history of mitral regurgitation. She is doing very well after her mitral valve replacement. She is remaining in sinus rhythm. She continues her overall rehabilitation.  The patient is aware that I am retiring at the end of September, 2016. We have decided today that she will follow with Dr. Meda Coffee.    Past Medical History  Diagnosis Date  . Multiple sclerosis   . Breast cancer   . Ovarian cyst   . Hypothyroidism   . Mitral valve regurgitation     Mitral valve replacement April, 2013, Mitral valve prolapse  . CHF (congestive heart failure)     Related to severe mitral regurgitation, April, 2013  . Shortness of breath     Related to severe mitral regurgitation, April, 2013  . Paroxysmal atrial fibrillation     Rapid atrial fibrillation in-hospital, Rapid cardioversion,  before mitral valve surgery  . Neuromuscular disorder     ms  . Arthritis     knees  . S/P mitral valve replacement 01/26/2012    19mm Sorin Carbomedics Optiform mechanical prosthesis via right mini thoracotomy  . S/P Maze operation for atrial fibrillation 01/26/2012    Complete biatrial lesion set using cryothermy via right mini thoracotomy  . Pulmonary hypertension     Echo, April, 2013, before mitral valve surgery  . 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 Assess by pulmonary team in the hospital April, 2013  . Anxiety   . Neurogenic bladder   . Ejection fraction     EF 60%, echo, April, 2013, with severe MR before mitral valve replacement  . Warfarin anticoagulation     Mechanical mitral prosthesis, April, 20136    Past  Surgical History  Procedure Laterality Date  . Breast lumpectomy    . Lymphadenectomy    . Wrist surgery    . Hernia repair    . Tonsillectomy    . Cystoscopy    . Laparoscopy    . Tee without cardioversion  01/19/2012    Procedure: TRANSESOPHAGEAL ECHOCARDIOGRAM (TEE);  Surgeon: Peter M Martinique, MD;  [facility] ENDOSCOPY;  Service: Cardiovascular;  Laterality: N/A;  . Maze  01/26/2012    Procedure: MAZE;  Surgeon: Rexene Alberts, MD;  Location: [place];  Service: Open Heart Surgery;  Laterality: N/A;  . Mitral valve replacement  01/26/2012    Procedure: MINIMALLY INVASIVE MITRAL VALVE (MV) REPLACEMENT;  Surgeon: Rexene Alberts, MD;  Location: [place];  Service: Open Heart Surgery;  Laterality: Right;  . Chest tube insertion  01/26/2012    Procedure: CHEST TUBE INSERTION;  Surgeon: Rexene Alberts, MD;  Location: [place];  Service: Open Heart Surgery;  Laterality: Left;  . Left and right heart catheterization with coronary angiogram N/A 01/20/2012    Procedure: LEFT AND RIGHT HEART CATHETERIZATION WITH CORONARY ANGIOGRAM;  Surgeon: Burnell Blanks, MD;  Location: [facility] CATH LAB;  Service: Cardiovascular;  Laterality: N/A;    Patient Active Problem List   Diagnosis Date Noted  . Encounter for therapeutic drug monitoring 11/27/2013  . Palpitations 09/20/2013  .  Mitral valve regurgitation   . Breast cancer   . Hypothyroidism   . CHF (congestive heart failure)   . Shortness of breath   . Paroxysmal atrial fibrillation   . Neuromuscular disorder   . Arthritis   . COPD (chronic obstructive pulmonary disease)   . Neurogenic bladder   . Ejection fraction   . Warfarin anticoagulation   . First degree heart block 01/29/2012  . 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 S/P Maze operation for atrial fibrillation 01/26/2012  . 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 Hypokalemia 01/19/2012  . Anxiety 01/18/2012  . Tachycardia 01/18/2012  . Pulmonary hypertension 01/18/2012  . Hypoxemia 01/18/2012    . Multiple sclerosis 01/19/2007  . 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 OSTEOPENIA 01/19/2007  . URINARY INCONTINENCE 01/19/2007  . COLONOSCOPY, HX OF 01/19/2007  . GENITAL HERPES, HX OF 01/19/2007  . BREAST CANCER, HX OF 06/19/1998      Current Outpatient Prescriptions  Medication Sig Dispense Refill  . ARMOUR THYROID 15 MG tablet 15 mg. ALTERNATE 60 MG ONE DAY AND THE NEXT DAY IS 45MG  FOR THE WEEK  0  . Black Cohosh 40 MG CAPS Take 1 capsule by mouth daily.    . Bromelains (BROMELAIN PO) Take 1 capsule by mouth 2 (two) times daily.    . Cholecalciferol (VITAMIN D) 2000 UNITS CAPS Take 6,000 Units by mouth daily.    . Coenzyme Q10 (CO Q-10) 100 MG CAPS Take 1 capsule by mouth daily.     . Cranberry 500 MG CAPS Take 1 capsule by mouth daily.    Marland Kitchen L-THEANINE PO Take 1 capsule by mouth daily.     Marland Kitchen lactose free nutrition (BOOST PLUS) LIQD Take 237 mLs by mouth daily.     Marland Kitchen Lysine 500 MG CAPS Take 1 capsule by mouth daily.    . metoprolol tartrate (LOPRESSOR) 25 MG tablet Take 1 tablet (25 mg total) by mouth 2 (two) times daily. 180 tablet 3  . MILK THISTLE PO Take 1 capsule by mouth 2 (two) times daily.    . Misc Natural Products (GLUCOSAMINE CHOND COMPLEX/MSM PO) Strength of dose Glucosamine 1500mg  /Chodroitron 1000mg / MSM 500mg  takes one twice daily    . Olive Leaf 500 MG CAPS Take 1 capsule by mouth 2 (two) times daily.     Marland Kitchen OVER THE COUNTER MEDICATION Red marine algae - 2 capsules daily    . Probiotic Product (PROBIOTIC DAILY PO) Take 1 capsule once a day    . PROGESTERONE MICRONIZED PO Take 75 mg by mouth daily.     Marland Kitchen thyroid (ARMOUR) 60 MG tablet Take 60 mg by mouth daily. Alternating 60mg  and 45mg  QOD    . vitamin A 10000 UNIT capsule Take 10,000 Units by mouth daily.    . vitamin E 400 UNIT capsule Take 400 Units by mouth daily.    Marland Kitchen warfarin (COUMADIN) 5 MG tablet Take as directed by cooumadin clinic 120 tablet 1  . lidocaine-prilocaine (EMLA) cream Apply 1 application topically as needed  (per pt this is for use before blood draws).   0   No current facility-administered medications for this visit.    Allergies:   Erythromycin    Social History:  The patient  reports that she has never smoked. She has never used smokeless tobacco. She reports that she does not drink alcohol or use illicit drugs.   Family History:  The patient's family history includes CAD in her mother and another family member; Heart disease  in her father.    ROS:  Please see the history of present illness.    Patient denies fever, chills, headache, sweats, rash, change in vision, change in hearing, chest pain, cough, nausea or vomiting, urinary symptoms. All other systems are reviewed and are negative.    PHYSICAL EXAM: VS:  BP 110/72 mmHg  Pulse 73  Ht 5' 9.5" (1.765 m)  Wt 146 lb 1.9 oz (66.28 kg)  BMI 21.28 kg/m2  SpO2 98% , Patient is quite stable. She is oriented to person time and place. Affect is normal. Head is atraumatic. Sclera and conjunctiva are normal. There is no jugulovenous distention. Lungs are clear. Respiratory effort is not labored. Cardiac exam reveals crisp closure sound of her mitral mechanical prosthesis. Her abdomen is soft. There is no peripheral edema. There are no musculoskeletal deformities although she is tall and thin. There are no skin rashes. The rhythm is regular.  EKG:   EKG is not done today.   Recent Labs: No results found for requested labs within last 365 days.    Lipid Panel No results found for: CHOL, TRIG, HDL, CHOLHDL, VLDL, LDLCALC, LDLDIRECT    Wt Readings from Last 3 Encounters:  03/25/15 146 lb 1.9 oz (66.28 kg)  09/26/14 145 lb (65.772 kg)  04/02/14 150 lb (68.04 kg)      Current medicines are reviewed  The patient understands her medications.     ASSESSMENT AND PLAN:

## 2015-03-25 NOTE — Assessment & Plan Note (Signed)
Patient had a maze procedure at the time of her mitral valve replacement. She is holding sinus rhythm and doing well.

## 2015-03-25 NOTE — Patient Instructions (Signed)
Medication Instructions:  Same-no changes  Labwork: None  Testing/Procedures: None  Follow-Up: Your physician wants you to follow-up in: 6 months with Dr Meda Coffee. You will receive a reminder letter in the mail two months in advance. If you don't receive a letter, please call our office to schedule the follow-up appointment.

## 2015-03-25 NOTE — Assessment & Plan Note (Signed)
The patient has a mechanical prosthesis in the mitral position. It is working very well. She is doing well. No further workup is needed.  The patient will be following with Dr. Meda Coffee in the future.

## 2015-03-27 ENCOUNTER — Encounter (HOSPITAL_COMMUNITY): Payer: Self-pay

## 2015-03-29 ENCOUNTER — Encounter (HOSPITAL_COMMUNITY)
Admission: RE | Admit: 2015-03-29 | Discharge: 2015-03-29 | Disposition: A | Discharge status: Home / Self Care | Payer: Self-pay | Source: Ambulatory Visit | Attending: Cardiology | Admitting: Cardiology

## 2015-04-01 ENCOUNTER — Encounter (HOSPITAL_COMMUNITY)
Admission: RE | Admit: 2015-04-01 | Discharge: 2015-04-01 | Disposition: A | Discharge status: Home / Self Care | Payer: Self-pay | Source: Ambulatory Visit | Attending: Cardiology | Admitting: Cardiology

## 2015-04-03 ENCOUNTER — Encounter (HOSPITAL_COMMUNITY): Payer: Self-pay

## 2015-04-05 ENCOUNTER — Encounter (HOSPITAL_COMMUNITY)
Admission: RE | Admit: 2015-04-05 | Discharge: 2015-04-05 | Disposition: A | Discharge status: Home / Self Care | Payer: Self-pay | Source: Ambulatory Visit | Attending: Cardiology | Admitting: Cardiology

## 2015-04-08 ENCOUNTER — Encounter (HOSPITAL_COMMUNITY): Payer: Self-pay

## 2015-04-10 ENCOUNTER — Encounter (HOSPITAL_COMMUNITY): Payer: Self-pay

## 2015-04-11 DIAGNOSIS — R350 Frequency of micturition: Secondary | ICD-10-CM | POA: Diagnosis not present

## 2015-04-11 DIAGNOSIS — N3941 Urge incontinence: Secondary | ICD-10-CM | POA: Diagnosis not present

## 2015-04-12 ENCOUNTER — Encounter (HOSPITAL_COMMUNITY)
Admission: RE | Admit: 2015-04-12 | Discharge: 2015-04-12 | Disposition: A | Discharge status: Home / Self Care | Payer: Self-pay | Source: Ambulatory Visit | Attending: Cardiology | Admitting: Cardiology

## 2015-04-15 ENCOUNTER — Encounter (HOSPITAL_COMMUNITY)
Admission: RE | Admit: 2015-04-15 | Discharge: 2015-04-15 | Disposition: A | Discharge status: Home / Self Care | Payer: Self-pay | Source: Ambulatory Visit | Attending: Cardiology | Admitting: Cardiology

## 2015-04-17 ENCOUNTER — Encounter (HOSPITAL_COMMUNITY): Payer: Self-pay

## 2015-04-19 ENCOUNTER — Encounter (HOSPITAL_COMMUNITY)
Admission: RE | Admit: 2015-04-19 | Discharge: 2015-04-19 | Disposition: A | Discharge status: Home / Self Care | Payer: Self-pay | Source: Ambulatory Visit | Attending: Cardiology | Admitting: Cardiology

## 2015-04-19 DIAGNOSIS — Z9889 Other specified postprocedural states: Secondary | ICD-10-CM | POA: Insufficient documentation

## 2015-04-19 DIAGNOSIS — Z954 Presence of other heart-valve replacement: Secondary | ICD-10-CM | POA: Insufficient documentation

## 2015-04-19 DIAGNOSIS — I509 Heart failure, unspecified: Secondary | ICD-10-CM | POA: Insufficient documentation

## 2015-04-19 DIAGNOSIS — Z5181 Encounter for therapeutic drug level monitoring: Secondary | ICD-10-CM | POA: Insufficient documentation

## 2015-04-19 DIAGNOSIS — Z7901 Long term (current) use of anticoagulants: Secondary | ICD-10-CM | POA: Insufficient documentation

## 2015-04-19 DIAGNOSIS — I4891 Unspecified atrial fibrillation: Secondary | ICD-10-CM | POA: Insufficient documentation

## 2015-04-24 ENCOUNTER — Encounter (HOSPITAL_COMMUNITY): Payer: Medicare Other

## 2015-04-26 ENCOUNTER — Encounter (HOSPITAL_COMMUNITY)
Admission: RE | Admit: 2015-04-26 | Discharge: 2015-04-26 | Disposition: A | Discharge status: Home / Self Care | Payer: Self-pay | Source: Ambulatory Visit | Attending: Cardiology | Admitting: Cardiology

## 2015-04-29 ENCOUNTER — Encounter (HOSPITAL_COMMUNITY)
Admission: RE | Admit: 2015-04-29 | Discharge: 2015-04-29 | Disposition: A | Discharge status: Home / Self Care | Payer: Self-pay | Source: Ambulatory Visit | Attending: Cardiology | Admitting: Cardiology

## 2015-05-01 ENCOUNTER — Encounter (HOSPITAL_COMMUNITY): Payer: Self-pay

## 2015-05-02 DIAGNOSIS — N3941 Urge incontinence: Secondary | ICD-10-CM | POA: Diagnosis not present

## 2015-05-02 DIAGNOSIS — R350 Frequency of micturition: Secondary | ICD-10-CM | POA: Diagnosis not present

## 2015-05-03 ENCOUNTER — Encounter (HOSPITAL_COMMUNITY)
Admission: RE | Admit: 2015-05-03 | Discharge: 2015-05-03 | Disposition: A | Discharge status: Home / Self Care | Payer: Self-pay | Source: Ambulatory Visit | Attending: Cardiology | Admitting: Cardiology

## 2015-05-06 ENCOUNTER — Ambulatory Visit (INDEPENDENT_AMBULATORY_CARE_PROVIDER_SITE_OTHER): Payer: Medicare Other

## 2015-05-06 ENCOUNTER — Encounter (HOSPITAL_COMMUNITY)
Admission: RE | Admit: 2015-05-06 | Discharge: 2015-05-06 | Disposition: A | Discharge status: Home / Self Care | Payer: Self-pay | Source: Ambulatory Visit | Attending: Cardiology | Admitting: Cardiology

## 2015-05-06 DIAGNOSIS — Z9889 Other specified postprocedural states: Secondary | ICD-10-CM

## 2015-05-06 DIAGNOSIS — Z7901 Long term (current) use of anticoagulants: Secondary | ICD-10-CM

## 2015-05-06 DIAGNOSIS — I509 Heart failure, unspecified: Secondary | ICD-10-CM | POA: Diagnosis not present

## 2015-05-06 DIAGNOSIS — Z5181 Encounter for therapeutic drug level monitoring: Secondary | ICD-10-CM

## 2015-05-06 DIAGNOSIS — I4891 Unspecified atrial fibrillation: Secondary | ICD-10-CM | POA: Diagnosis not present

## 2015-05-06 DIAGNOSIS — Z952 Presence of prosthetic heart valve: Secondary | ICD-10-CM

## 2015-05-06 DIAGNOSIS — Z8679 Personal history of other diseases of the circulatory system: Secondary | ICD-10-CM

## 2015-05-06 DIAGNOSIS — Z954 Presence of other heart-valve replacement: Secondary | ICD-10-CM

## 2015-05-06 LAB — POCT INR: INR: 4.2

## 2015-05-08 ENCOUNTER — Encounter (HOSPITAL_COMMUNITY): Payer: Self-pay

## 2015-05-08 DIAGNOSIS — G35 Multiple sclerosis: Secondary | ICD-10-CM | POA: Diagnosis not present

## 2015-05-10 ENCOUNTER — Encounter (HOSPITAL_COMMUNITY)
Admission: RE | Admit: 2015-05-10 | Discharge: 2015-05-10 | Disposition: A | Discharge status: Home / Self Care | Payer: Self-pay | Source: Ambulatory Visit | Attending: Cardiology | Admitting: Cardiology

## 2015-05-13 ENCOUNTER — Encounter (HOSPITAL_COMMUNITY)
Admission: RE | Admit: 2015-05-13 | Discharge: 2015-05-13 | Disposition: A | Discharge status: Home / Self Care | Payer: Self-pay | Source: Ambulatory Visit | Attending: Cardiology | Admitting: Cardiology

## 2015-05-15 ENCOUNTER — Encounter (HOSPITAL_COMMUNITY): Payer: Self-pay

## 2015-05-17 ENCOUNTER — Encounter (HOSPITAL_COMMUNITY)
Admission: RE | Admit: 2015-05-17 | Discharge: 2015-05-17 | Disposition: A | Discharge status: Home / Self Care | Payer: Self-pay | Source: Ambulatory Visit | Attending: Cardiology | Admitting: Cardiology

## 2015-05-20 ENCOUNTER — Encounter (HOSPITAL_COMMUNITY)
Admission: RE | Admit: 2015-05-20 | Discharge: 2015-05-20 | Disposition: A | Discharge status: Home / Self Care | Payer: Self-pay | Source: Ambulatory Visit | Attending: Cardiology | Admitting: Cardiology

## 2015-05-20 DIAGNOSIS — Z7901 Long term (current) use of anticoagulants: Secondary | ICD-10-CM | POA: Insufficient documentation

## 2015-05-20 DIAGNOSIS — Z9889 Other specified postprocedural states: Secondary | ICD-10-CM | POA: Insufficient documentation

## 2015-05-20 DIAGNOSIS — Z954 Presence of other heart-valve replacement: Secondary | ICD-10-CM | POA: Insufficient documentation

## 2015-05-20 DIAGNOSIS — I509 Heart failure, unspecified: Secondary | ICD-10-CM | POA: Insufficient documentation

## 2015-05-20 DIAGNOSIS — I4891 Unspecified atrial fibrillation: Secondary | ICD-10-CM | POA: Insufficient documentation

## 2015-05-20 DIAGNOSIS — Z5181 Encounter for therapeutic drug level monitoring: Secondary | ICD-10-CM | POA: Insufficient documentation

## 2015-05-22 ENCOUNTER — Encounter (HOSPITAL_COMMUNITY): Payer: Self-pay

## 2015-05-22 DIAGNOSIS — N3941 Urge incontinence: Secondary | ICD-10-CM | POA: Diagnosis not present

## 2015-05-22 DIAGNOSIS — R350 Frequency of micturition: Secondary | ICD-10-CM | POA: Diagnosis not present

## 2015-05-24 ENCOUNTER — Encounter (HOSPITAL_COMMUNITY)
Admission: RE | Admit: 2015-05-24 | Discharge: 2015-05-24 | Disposition: A | Discharge status: Home / Self Care | Payer: Self-pay | Source: Ambulatory Visit | Attending: Cardiology | Admitting: Cardiology

## 2015-05-27 ENCOUNTER — Ambulatory Visit (INDEPENDENT_AMBULATORY_CARE_PROVIDER_SITE_OTHER): Payer: Medicare Other | Admitting: *Deleted

## 2015-05-27 ENCOUNTER — Encounter (HOSPITAL_COMMUNITY)
Admission: RE | Admit: 2015-05-27 | Discharge: 2015-05-27 | Disposition: A | Discharge status: Home / Self Care | Payer: Self-pay | Source: Ambulatory Visit | Attending: Cardiology | Admitting: Cardiology

## 2015-05-27 DIAGNOSIS — I4891 Unspecified atrial fibrillation: Secondary | ICD-10-CM

## 2015-05-27 DIAGNOSIS — Z9889 Other specified postprocedural states: Secondary | ICD-10-CM | POA: Diagnosis not present

## 2015-05-27 DIAGNOSIS — Z7901 Long term (current) use of anticoagulants: Secondary | ICD-10-CM

## 2015-05-27 DIAGNOSIS — I509 Heart failure, unspecified: Secondary | ICD-10-CM | POA: Diagnosis not present

## 2015-05-27 DIAGNOSIS — Z5181 Encounter for therapeutic drug level monitoring: Secondary | ICD-10-CM

## 2015-05-27 DIAGNOSIS — Z954 Presence of other heart-valve replacement: Secondary | ICD-10-CM

## 2015-05-27 DIAGNOSIS — Z952 Presence of prosthetic heart valve: Secondary | ICD-10-CM

## 2015-05-27 DIAGNOSIS — Z8679 Personal history of other diseases of the circulatory system: Secondary | ICD-10-CM

## 2015-05-27 LAB — PROTIME-INR
INR: 4.71 — ABNORMAL HIGH (ref <1.50)
Prothrombin Time: 44.3 seconds — ABNORMAL HIGH (ref 11.6–15.2)

## 2015-05-27 LAB — POCT INR: INR: 6.6

## 2015-05-29 ENCOUNTER — Encounter (HOSPITAL_COMMUNITY): Payer: Self-pay

## 2015-05-31 ENCOUNTER — Encounter (HOSPITAL_COMMUNITY)
Admission: RE | Admit: 2015-05-31 | Discharge: 2015-05-31 | Disposition: A | Discharge status: Home / Self Care | Payer: Self-pay | Source: Ambulatory Visit | Attending: Cardiology | Admitting: Cardiology

## 2015-06-03 ENCOUNTER — Encounter (HOSPITAL_COMMUNITY): Payer: Self-pay

## 2015-06-05 ENCOUNTER — Encounter (HOSPITAL_COMMUNITY): Payer: Self-pay

## 2015-06-07 ENCOUNTER — Encounter (HOSPITAL_COMMUNITY)
Admission: RE | Admit: 2015-06-07 | Discharge: 2015-06-07 | Disposition: A | Discharge status: Home / Self Care | Payer: Self-pay | Source: Ambulatory Visit | Attending: Cardiology | Admitting: Cardiology

## 2015-06-10 ENCOUNTER — Encounter (HOSPITAL_COMMUNITY)
Admission: RE | Admit: 2015-06-10 | Discharge: 2015-06-10 | Disposition: A | Discharge status: Home / Self Care | Payer: Self-pay | Source: Ambulatory Visit | Attending: Cardiology | Admitting: Cardiology

## 2015-06-10 ENCOUNTER — Ambulatory Visit (INDEPENDENT_AMBULATORY_CARE_PROVIDER_SITE_OTHER): Payer: Medicare Other

## 2015-06-10 DIAGNOSIS — Z5181 Encounter for therapeutic drug level monitoring: Secondary | ICD-10-CM | POA: Diagnosis not present

## 2015-06-10 DIAGNOSIS — I509 Heart failure, unspecified: Secondary | ICD-10-CM | POA: Diagnosis not present

## 2015-06-10 DIAGNOSIS — Z9889 Other specified postprocedural states: Secondary | ICD-10-CM

## 2015-06-10 DIAGNOSIS — I4891 Unspecified atrial fibrillation: Secondary | ICD-10-CM | POA: Diagnosis not present

## 2015-06-10 DIAGNOSIS — Z954 Presence of other heart-valve replacement: Secondary | ICD-10-CM

## 2015-06-10 DIAGNOSIS — Z7901 Long term (current) use of anticoagulants: Secondary | ICD-10-CM

## 2015-06-10 DIAGNOSIS — Z952 Presence of prosthetic heart valve: Secondary | ICD-10-CM

## 2015-06-10 DIAGNOSIS — Z8679 Personal history of other diseases of the circulatory system: Secondary | ICD-10-CM

## 2015-06-10 LAB — POCT INR: INR: 6.6

## 2015-06-12 ENCOUNTER — Encounter (HOSPITAL_COMMUNITY)
Admission: RE | Admit: 2015-06-12 | Discharge: 2015-06-12 | Disposition: A | Discharge status: Home / Self Care | Payer: Self-pay | Source: Ambulatory Visit | Attending: Cardiology | Admitting: Cardiology

## 2015-06-13 DIAGNOSIS — R350 Frequency of micturition: Secondary | ICD-10-CM | POA: Diagnosis not present

## 2015-06-13 DIAGNOSIS — N3941 Urge incontinence: Secondary | ICD-10-CM | POA: Diagnosis not present

## 2015-06-14 ENCOUNTER — Encounter (HOSPITAL_COMMUNITY)
Admission: RE | Admit: 2015-06-14 | Discharge: 2015-06-14 | Disposition: A | Discharge status: Home / Self Care | Payer: Self-pay | Source: Ambulatory Visit | Attending: Cardiology | Admitting: Cardiology

## 2015-06-17 ENCOUNTER — Encounter (HOSPITAL_COMMUNITY)
Admission: RE | Admit: 2015-06-17 | Discharge: 2015-06-17 | Disposition: A | Discharge status: Home / Self Care | Payer: Self-pay | Source: Ambulatory Visit | Attending: Cardiology | Admitting: Cardiology

## 2015-06-19 ENCOUNTER — Ambulatory Visit (INDEPENDENT_AMBULATORY_CARE_PROVIDER_SITE_OTHER): Payer: Medicare Other | Admitting: *Deleted

## 2015-06-19 ENCOUNTER — Encounter (HOSPITAL_COMMUNITY): Payer: Self-pay

## 2015-06-19 DIAGNOSIS — Z8679 Personal history of other diseases of the circulatory system: Secondary | ICD-10-CM

## 2015-06-19 DIAGNOSIS — Z952 Presence of prosthetic heart valve: Secondary | ICD-10-CM

## 2015-06-19 DIAGNOSIS — I4891 Unspecified atrial fibrillation: Secondary | ICD-10-CM | POA: Diagnosis not present

## 2015-06-19 DIAGNOSIS — I509 Heart failure, unspecified: Secondary | ICD-10-CM

## 2015-06-19 DIAGNOSIS — Z5181 Encounter for therapeutic drug level monitoring: Secondary | ICD-10-CM

## 2015-06-19 DIAGNOSIS — Z954 Presence of other heart-valve replacement: Secondary | ICD-10-CM

## 2015-06-19 DIAGNOSIS — Z7901 Long term (current) use of anticoagulants: Secondary | ICD-10-CM | POA: Diagnosis not present

## 2015-06-19 DIAGNOSIS — Z9889 Other specified postprocedural states: Secondary | ICD-10-CM

## 2015-06-19 LAB — POCT INR: INR: 3.9

## 2015-06-21 ENCOUNTER — Encounter (HOSPITAL_COMMUNITY)
Admission: RE | Admit: 2015-06-21 | Discharge: 2015-06-21 | Disposition: A | Discharge status: Home / Self Care | Payer: Self-pay | Source: Ambulatory Visit | Attending: Cardiology | Admitting: Cardiology

## 2015-06-21 DIAGNOSIS — Z5181 Encounter for therapeutic drug level monitoring: Secondary | ICD-10-CM | POA: Insufficient documentation

## 2015-06-21 DIAGNOSIS — Z9889 Other specified postprocedural states: Secondary | ICD-10-CM | POA: Insufficient documentation

## 2015-06-21 DIAGNOSIS — I509 Heart failure, unspecified: Secondary | ICD-10-CM | POA: Insufficient documentation

## 2015-06-21 DIAGNOSIS — I4891 Unspecified atrial fibrillation: Secondary | ICD-10-CM | POA: Insufficient documentation

## 2015-06-21 DIAGNOSIS — Z954 Presence of other heart-valve replacement: Secondary | ICD-10-CM | POA: Insufficient documentation

## 2015-06-21 DIAGNOSIS — Z7901 Long term (current) use of anticoagulants: Secondary | ICD-10-CM | POA: Insufficient documentation

## 2015-06-28 ENCOUNTER — Encounter (HOSPITAL_COMMUNITY)
Admission: RE | Admit: 2015-06-28 | Discharge: 2015-06-28 | Disposition: A | Discharge status: Home / Self Care | Payer: Self-pay | Source: Ambulatory Visit | Attending: Cardiology | Admitting: Cardiology

## 2015-07-01 ENCOUNTER — Encounter (HOSPITAL_COMMUNITY)
Admission: RE | Admit: 2015-07-01 | Discharge: 2015-07-01 | Disposition: A | Discharge status: Home / Self Care | Payer: Self-pay | Source: Ambulatory Visit | Attending: Cardiology | Admitting: Cardiology

## 2015-07-01 ENCOUNTER — Ambulatory Visit (INDEPENDENT_AMBULATORY_CARE_PROVIDER_SITE_OTHER): Payer: Medicare Other | Admitting: *Deleted

## 2015-07-01 DIAGNOSIS — Z8679 Personal history of other diseases of the circulatory system: Secondary | ICD-10-CM

## 2015-07-01 DIAGNOSIS — I4891 Unspecified atrial fibrillation: Secondary | ICD-10-CM

## 2015-07-01 DIAGNOSIS — Z7901 Long term (current) use of anticoagulants: Secondary | ICD-10-CM | POA: Diagnosis not present

## 2015-07-01 DIAGNOSIS — Z9889 Other specified postprocedural states: Secondary | ICD-10-CM

## 2015-07-01 DIAGNOSIS — Z5181 Encounter for therapeutic drug level monitoring: Secondary | ICD-10-CM | POA: Diagnosis not present

## 2015-07-01 DIAGNOSIS — Z954 Presence of other heart-valve replacement: Secondary | ICD-10-CM

## 2015-07-01 DIAGNOSIS — I509 Heart failure, unspecified: Secondary | ICD-10-CM | POA: Diagnosis not present

## 2015-07-01 DIAGNOSIS — Z952 Presence of prosthetic heart valve: Secondary | ICD-10-CM

## 2015-07-01 LAB — POCT INR: INR: 4

## 2015-07-03 ENCOUNTER — Encounter (HOSPITAL_COMMUNITY): Payer: Self-pay

## 2015-07-04 DIAGNOSIS — R350 Frequency of micturition: Secondary | ICD-10-CM | POA: Diagnosis not present

## 2015-07-04 DIAGNOSIS — N3941 Urge incontinence: Secondary | ICD-10-CM | POA: Diagnosis not present

## 2015-07-05 ENCOUNTER — Encounter (HOSPITAL_COMMUNITY)
Admission: RE | Admit: 2015-07-05 | Discharge: 2015-07-05 | Disposition: A | Discharge status: Home / Self Care | Payer: Self-pay | Source: Ambulatory Visit | Attending: Cardiology | Admitting: Cardiology

## 2015-07-08 ENCOUNTER — Encounter (HOSPITAL_COMMUNITY)
Admission: RE | Admit: 2015-07-08 | Discharge: 2015-07-08 | Disposition: A | Discharge status: Home / Self Care | Payer: Self-pay | Source: Ambulatory Visit | Attending: Cardiology | Admitting: Cardiology

## 2015-07-10 ENCOUNTER — Encounter (HOSPITAL_COMMUNITY): Payer: Self-pay

## 2015-07-12 ENCOUNTER — Ambulatory Visit (INDEPENDENT_AMBULATORY_CARE_PROVIDER_SITE_OTHER): Payer: Medicare Other | Admitting: *Deleted

## 2015-07-12 ENCOUNTER — Encounter (HOSPITAL_COMMUNITY)
Admission: RE | Admit: 2015-07-12 | Discharge: 2015-07-12 | Disposition: A | Discharge status: Home / Self Care | Payer: Self-pay | Source: Ambulatory Visit | Attending: Cardiology | Admitting: Cardiology

## 2015-07-12 DIAGNOSIS — Z9889 Other specified postprocedural states: Secondary | ICD-10-CM

## 2015-07-12 DIAGNOSIS — I509 Heart failure, unspecified: Secondary | ICD-10-CM | POA: Diagnosis not present

## 2015-07-12 DIAGNOSIS — Z5181 Encounter for therapeutic drug level monitoring: Secondary | ICD-10-CM

## 2015-07-12 DIAGNOSIS — Z7901 Long term (current) use of anticoagulants: Secondary | ICD-10-CM | POA: Diagnosis not present

## 2015-07-12 DIAGNOSIS — Z952 Presence of prosthetic heart valve: Secondary | ICD-10-CM

## 2015-07-12 DIAGNOSIS — Z8679 Personal history of other diseases of the circulatory system: Secondary | ICD-10-CM

## 2015-07-12 DIAGNOSIS — I4891 Unspecified atrial fibrillation: Secondary | ICD-10-CM

## 2015-07-12 DIAGNOSIS — Z954 Presence of other heart-valve replacement: Secondary | ICD-10-CM

## 2015-07-12 LAB — POCT INR: INR: 3.2

## 2015-07-15 ENCOUNTER — Encounter (HOSPITAL_COMMUNITY)
Admission: RE | Admit: 2015-07-15 | Discharge: 2015-07-15 | Disposition: A | Discharge status: Home / Self Care | Payer: Self-pay | Source: Ambulatory Visit | Attending: Cardiology | Admitting: Cardiology

## 2015-07-17 ENCOUNTER — Encounter (HOSPITAL_COMMUNITY): Payer: Self-pay

## 2015-07-19 ENCOUNTER — Encounter (HOSPITAL_COMMUNITY)
Admission: RE | Admit: 2015-07-19 | Discharge: 2015-07-19 | Disposition: A | Discharge status: Home / Self Care | Payer: Self-pay | Source: Ambulatory Visit | Attending: Cardiology | Admitting: Cardiology

## 2015-07-22 ENCOUNTER — Encounter (HOSPITAL_COMMUNITY)
Admission: RE | Admit: 2015-07-22 | Discharge: 2015-07-22 | Disposition: A | Discharge status: Home / Self Care | Payer: Self-pay | Source: Ambulatory Visit | Attending: Cardiology | Admitting: Cardiology

## 2015-07-22 DIAGNOSIS — I509 Heart failure, unspecified: Secondary | ICD-10-CM | POA: Insufficient documentation

## 2015-07-22 DIAGNOSIS — I4891 Unspecified atrial fibrillation: Secondary | ICD-10-CM | POA: Insufficient documentation

## 2015-07-22 DIAGNOSIS — Z954 Presence of other heart-valve replacement: Secondary | ICD-10-CM | POA: Insufficient documentation

## 2015-07-22 DIAGNOSIS — Z7901 Long term (current) use of anticoagulants: Secondary | ICD-10-CM | POA: Insufficient documentation

## 2015-07-22 DIAGNOSIS — Z9889 Other specified postprocedural states: Secondary | ICD-10-CM | POA: Insufficient documentation

## 2015-07-22 DIAGNOSIS — Z5181 Encounter for therapeutic drug level monitoring: Secondary | ICD-10-CM | POA: Insufficient documentation

## 2015-07-24 ENCOUNTER — Encounter (HOSPITAL_COMMUNITY): Payer: Self-pay

## 2015-07-25 DIAGNOSIS — R7989 Other specified abnormal findings of blood chemistry: Secondary | ICD-10-CM | POA: Diagnosis not present

## 2015-07-25 DIAGNOSIS — E039 Hypothyroidism, unspecified: Secondary | ICD-10-CM | POA: Diagnosis not present

## 2015-07-25 DIAGNOSIS — E559 Vitamin D deficiency, unspecified: Secondary | ICD-10-CM | POA: Diagnosis not present

## 2015-07-25 DIAGNOSIS — G35 Multiple sclerosis: Secondary | ICD-10-CM | POA: Diagnosis not present

## 2015-07-26 ENCOUNTER — Ambulatory Visit (INDEPENDENT_AMBULATORY_CARE_PROVIDER_SITE_OTHER): Payer: Medicare Other | Admitting: *Deleted

## 2015-07-26 ENCOUNTER — Encounter (HOSPITAL_COMMUNITY): Payer: Self-pay

## 2015-07-26 DIAGNOSIS — Z9889 Other specified postprocedural states: Secondary | ICD-10-CM | POA: Diagnosis not present

## 2015-07-26 DIAGNOSIS — Z7901 Long term (current) use of anticoagulants: Secondary | ICD-10-CM | POA: Diagnosis not present

## 2015-07-26 DIAGNOSIS — Z954 Presence of other heart-valve replacement: Secondary | ICD-10-CM | POA: Diagnosis not present

## 2015-07-26 DIAGNOSIS — Z8679 Personal history of other diseases of the circulatory system: Secondary | ICD-10-CM

## 2015-07-26 DIAGNOSIS — I509 Heart failure, unspecified: Secondary | ICD-10-CM

## 2015-07-26 DIAGNOSIS — Z952 Presence of prosthetic heart valve: Secondary | ICD-10-CM

## 2015-07-26 DIAGNOSIS — Z5181 Encounter for therapeutic drug level monitoring: Secondary | ICD-10-CM

## 2015-07-26 DIAGNOSIS — I4891 Unspecified atrial fibrillation: Secondary | ICD-10-CM

## 2015-07-26 LAB — POCT INR: INR: 3

## 2015-07-29 ENCOUNTER — Encounter (HOSPITAL_COMMUNITY)
Admission: RE | Admit: 2015-07-29 | Discharge: 2015-07-29 | Disposition: A | Discharge status: Home / Self Care | Payer: Self-pay | Source: Ambulatory Visit | Attending: Cardiology | Admitting: Cardiology

## 2015-07-31 ENCOUNTER — Encounter (HOSPITAL_COMMUNITY): Payer: Self-pay

## 2015-08-02 ENCOUNTER — Encounter (HOSPITAL_COMMUNITY): Payer: Self-pay

## 2015-08-02 DIAGNOSIS — N3941 Urge incontinence: Secondary | ICD-10-CM | POA: Diagnosis not present

## 2015-08-02 DIAGNOSIS — R350 Frequency of micturition: Secondary | ICD-10-CM | POA: Diagnosis not present

## 2015-08-05 ENCOUNTER — Encounter (HOSPITAL_COMMUNITY): Payer: Self-pay

## 2015-08-07 ENCOUNTER — Encounter (HOSPITAL_COMMUNITY): Payer: Self-pay

## 2015-08-07 DIAGNOSIS — G35 Multiple sclerosis: Secondary | ICD-10-CM | POA: Diagnosis not present

## 2015-08-09 ENCOUNTER — Encounter (HOSPITAL_COMMUNITY)
Admission: RE | Admit: 2015-08-09 | Discharge: 2015-08-09 | Disposition: A | Discharge status: Home / Self Care | Payer: Self-pay | Source: Ambulatory Visit | Attending: Cardiology | Admitting: Cardiology

## 2015-08-12 ENCOUNTER — Encounter (HOSPITAL_COMMUNITY)
Admission: RE | Admit: 2015-08-12 | Discharge: 2015-08-12 | Disposition: A | Discharge status: Home / Self Care | Payer: Self-pay | Source: Ambulatory Visit | Attending: Cardiology | Admitting: Cardiology

## 2015-08-13 ENCOUNTER — Telehealth: Payer: Self-pay | Admitting: Cardiology

## 2015-08-13 NOTE — Telephone Encounter (Signed)
New Message  RN from Cardiac rehab calling concerning renewal of pt's orders for rehab.Pt was former Dr Ron Parker pt but, per recall note, is to see Dr Meda Coffee for f/u. Was told to route to Dr Meda Coffee for review on cardiac rehab. Please call back and discuss.

## 2015-08-13 NOTE — Telephone Encounter (Signed)
Olinty from Cardiac Rehab calling to inform that a Former Dr Ron Parker pt will be due to have her 6 month cardiac rehab orders renewed, and she wanted to know if Dr Meda Coffee would renew this, for she will be taking over and following the pt from here on out.  Informed Olinty with Cardiac Rehab, that would be perfectly fine, she can fax this and attention it to Dr Meda Coffee and myself for further review, sign and fax back to them.  Informed Olinty, that Dr Meda Coffee is out of the office this week, but I will have her sign the orders when she returns, and fax back to [facility] in Cardiac Rehab.  Olinty from cardiac rehab verbalized understanding and agrees with this plan.

## 2015-08-14 ENCOUNTER — Encounter (HOSPITAL_COMMUNITY): Payer: Self-pay

## 2015-08-16 ENCOUNTER — Encounter (HOSPITAL_COMMUNITY)
Admission: RE | Admit: 2015-08-16 | Discharge: 2015-08-16 | Disposition: A | Discharge status: Home / Self Care | Payer: Self-pay | Source: Ambulatory Visit | Attending: Cardiology | Admitting: Cardiology

## 2015-08-16 ENCOUNTER — Ambulatory Visit (INDEPENDENT_AMBULATORY_CARE_PROVIDER_SITE_OTHER): Payer: Medicare Other | Admitting: *Deleted

## 2015-08-16 DIAGNOSIS — Z8679 Personal history of other diseases of the circulatory system: Secondary | ICD-10-CM

## 2015-08-16 DIAGNOSIS — Z7901 Long term (current) use of anticoagulants: Secondary | ICD-10-CM | POA: Diagnosis not present

## 2015-08-16 DIAGNOSIS — Z954 Presence of other heart-valve replacement: Secondary | ICD-10-CM

## 2015-08-16 DIAGNOSIS — I509 Heart failure, unspecified: Secondary | ICD-10-CM | POA: Diagnosis not present

## 2015-08-16 DIAGNOSIS — I4891 Unspecified atrial fibrillation: Secondary | ICD-10-CM

## 2015-08-16 DIAGNOSIS — Z5181 Encounter for therapeutic drug level monitoring: Secondary | ICD-10-CM

## 2015-08-16 DIAGNOSIS — Z9889 Other specified postprocedural states: Secondary | ICD-10-CM

## 2015-08-16 DIAGNOSIS — Z952 Presence of prosthetic heart valve: Secondary | ICD-10-CM

## 2015-08-16 LAB — POCT INR: INR: 2.3

## 2015-08-19 ENCOUNTER — Encounter (HOSPITAL_COMMUNITY): Payer: Self-pay

## 2015-08-21 ENCOUNTER — Encounter (HOSPITAL_COMMUNITY): Payer: Medicare Other

## 2015-08-21 DIAGNOSIS — Z7901 Long term (current) use of anticoagulants: Secondary | ICD-10-CM | POA: Insufficient documentation

## 2015-08-21 DIAGNOSIS — Z954 Presence of other heart-valve replacement: Secondary | ICD-10-CM | POA: Insufficient documentation

## 2015-08-21 DIAGNOSIS — Z5181 Encounter for therapeutic drug level monitoring: Secondary | ICD-10-CM | POA: Insufficient documentation

## 2015-08-21 DIAGNOSIS — Z9889 Other specified postprocedural states: Secondary | ICD-10-CM | POA: Insufficient documentation

## 2015-08-21 DIAGNOSIS — I509 Heart failure, unspecified: Secondary | ICD-10-CM | POA: Insufficient documentation

## 2015-08-21 DIAGNOSIS — I4891 Unspecified atrial fibrillation: Secondary | ICD-10-CM | POA: Insufficient documentation

## 2015-08-23 ENCOUNTER — Encounter (HOSPITAL_COMMUNITY): Payer: Self-pay

## 2015-08-26 ENCOUNTER — Encounter (HOSPITAL_COMMUNITY): Payer: Self-pay

## 2015-08-28 ENCOUNTER — Encounter (HOSPITAL_COMMUNITY): Payer: Self-pay

## 2015-08-30 ENCOUNTER — Encounter (HOSPITAL_COMMUNITY): Payer: Self-pay

## 2015-09-02 ENCOUNTER — Encounter (HOSPITAL_COMMUNITY)
Admission: RE | Admit: 2015-09-02 | Discharge: 2015-09-02 | Disposition: A | Discharge status: Home / Self Care | Payer: Self-pay | Source: Ambulatory Visit | Attending: Cardiology | Admitting: Cardiology

## 2015-09-04 ENCOUNTER — Encounter (HOSPITAL_COMMUNITY): Payer: Self-pay

## 2015-09-05 DIAGNOSIS — R350 Frequency of micturition: Secondary | ICD-10-CM | POA: Diagnosis not present

## 2015-09-05 DIAGNOSIS — N3941 Urge incontinence: Secondary | ICD-10-CM | POA: Diagnosis not present

## 2015-09-06 ENCOUNTER — Encounter (HOSPITAL_COMMUNITY)
Admission: RE | Admit: 2015-09-06 | Discharge: 2015-09-06 | Disposition: A | Discharge status: Home / Self Care | Payer: Self-pay | Source: Ambulatory Visit | Attending: Cardiology | Admitting: Cardiology

## 2015-09-09 ENCOUNTER — Ambulatory Visit (INDEPENDENT_AMBULATORY_CARE_PROVIDER_SITE_OTHER): Payer: Medicare Other | Admitting: *Deleted

## 2015-09-09 ENCOUNTER — Encounter (HOSPITAL_COMMUNITY)
Admission: RE | Admit: 2015-09-09 | Discharge: 2015-09-09 | Disposition: A | Discharge status: Home / Self Care | Payer: Self-pay | Source: Ambulatory Visit | Attending: Cardiology | Admitting: Cardiology

## 2015-09-09 DIAGNOSIS — Z7901 Long term (current) use of anticoagulants: Secondary | ICD-10-CM

## 2015-09-09 DIAGNOSIS — I4891 Unspecified atrial fibrillation: Secondary | ICD-10-CM

## 2015-09-09 DIAGNOSIS — Z954 Presence of other heart-valve replacement: Secondary | ICD-10-CM

## 2015-09-09 DIAGNOSIS — I509 Heart failure, unspecified: Secondary | ICD-10-CM

## 2015-09-09 DIAGNOSIS — Z5181 Encounter for therapeutic drug level monitoring: Secondary | ICD-10-CM

## 2015-09-09 DIAGNOSIS — Z9889 Other specified postprocedural states: Secondary | ICD-10-CM

## 2015-09-09 DIAGNOSIS — Z952 Presence of prosthetic heart valve: Secondary | ICD-10-CM

## 2015-09-09 DIAGNOSIS — Z8679 Personal history of other diseases of the circulatory system: Secondary | ICD-10-CM

## 2015-09-09 LAB — POCT INR: INR: 3.1

## 2015-09-11 ENCOUNTER — Encounter (HOSPITAL_COMMUNITY): Payer: Self-pay

## 2015-09-16 ENCOUNTER — Encounter (HOSPITAL_COMMUNITY)
Admission: RE | Admit: 2015-09-16 | Discharge: 2015-09-16 | Disposition: A | Discharge status: Home / Self Care | Payer: Self-pay | Source: Ambulatory Visit | Attending: Cardiology | Admitting: Cardiology

## 2015-09-18 ENCOUNTER — Encounter (HOSPITAL_COMMUNITY): Payer: Self-pay

## 2015-09-20 ENCOUNTER — Encounter (HOSPITAL_COMMUNITY)
Admission: RE | Admit: 2015-09-20 | Discharge: 2015-09-20 | Disposition: A | Discharge status: Home / Self Care | Payer: Self-pay | Source: Ambulatory Visit | Attending: Cardiology | Admitting: Cardiology

## 2015-09-20 DIAGNOSIS — I4891 Unspecified atrial fibrillation: Secondary | ICD-10-CM | POA: Insufficient documentation

## 2015-09-20 DIAGNOSIS — Z5181 Encounter for therapeutic drug level monitoring: Secondary | ICD-10-CM | POA: Insufficient documentation

## 2015-09-20 DIAGNOSIS — I509 Heart failure, unspecified: Secondary | ICD-10-CM | POA: Insufficient documentation

## 2015-09-20 DIAGNOSIS — Z7901 Long term (current) use of anticoagulants: Secondary | ICD-10-CM | POA: Insufficient documentation

## 2015-09-20 DIAGNOSIS — Z954 Presence of other heart-valve replacement: Secondary | ICD-10-CM | POA: Insufficient documentation

## 2015-09-20 DIAGNOSIS — Z9889 Other specified postprocedural states: Secondary | ICD-10-CM | POA: Insufficient documentation

## 2015-09-23 ENCOUNTER — Encounter (HOSPITAL_COMMUNITY)
Admission: RE | Admit: 2015-09-23 | Discharge: 2015-09-23 | Disposition: A | Discharge status: Home / Self Care | Payer: Self-pay | Source: Ambulatory Visit | Attending: Cardiology | Admitting: Cardiology

## 2015-09-25 ENCOUNTER — Encounter (HOSPITAL_COMMUNITY): Payer: Self-pay

## 2015-09-25 DIAGNOSIS — G35 Multiple sclerosis: Secondary | ICD-10-CM | POA: Diagnosis not present

## 2015-09-26 DIAGNOSIS — N3941 Urge incontinence: Secondary | ICD-10-CM | POA: Diagnosis not present

## 2015-09-27 ENCOUNTER — Encounter (HOSPITAL_COMMUNITY)
Admission: RE | Admit: 2015-09-27 | Discharge: 2015-09-27 | Disposition: A | Discharge status: Home / Self Care | Payer: Self-pay | Source: Ambulatory Visit | Attending: Cardiology | Admitting: Cardiology

## 2015-09-30 ENCOUNTER — Encounter (HOSPITAL_COMMUNITY)
Admission: RE | Admit: 2015-09-30 | Discharge: 2015-09-30 | Disposition: A | Discharge status: Home / Self Care | Payer: Self-pay | Source: Ambulatory Visit | Attending: Cardiology | Admitting: Cardiology

## 2015-10-02 ENCOUNTER — Encounter (HOSPITAL_COMMUNITY): Payer: Self-pay

## 2015-10-04 ENCOUNTER — Encounter (HOSPITAL_COMMUNITY)
Admission: RE | Admit: 2015-10-04 | Discharge: 2015-10-04 | Disposition: A | Discharge status: Home / Self Care | Payer: Self-pay | Source: Ambulatory Visit | Attending: Cardiology | Admitting: Cardiology

## 2015-10-07 ENCOUNTER — Encounter: Payer: Self-pay | Admitting: Cardiology

## 2015-10-07 ENCOUNTER — Ambulatory Visit (INDEPENDENT_AMBULATORY_CARE_PROVIDER_SITE_OTHER): Payer: Medicare Other | Admitting: Cardiology

## 2015-10-07 ENCOUNTER — Ambulatory Visit (INDEPENDENT_AMBULATORY_CARE_PROVIDER_SITE_OTHER): Payer: Medicare Other | Admitting: *Deleted

## 2015-10-07 ENCOUNTER — Encounter (HOSPITAL_COMMUNITY): Payer: Self-pay

## 2015-10-07 VITALS — BP 124/72 | HR 79 | Ht 69.5 in | Wt 147.0 lb

## 2015-10-07 DIAGNOSIS — R072 Precordial pain: Secondary | ICD-10-CM | POA: Diagnosis not present

## 2015-10-07 DIAGNOSIS — I480 Paroxysmal atrial fibrillation: Secondary | ICD-10-CM

## 2015-10-07 DIAGNOSIS — Z7901 Long term (current) use of anticoagulants: Secondary | ICD-10-CM

## 2015-10-07 DIAGNOSIS — I509 Heart failure, unspecified: Secondary | ICD-10-CM

## 2015-10-07 DIAGNOSIS — Z954 Presence of other heart-valve replacement: Secondary | ICD-10-CM | POA: Diagnosis not present

## 2015-10-07 DIAGNOSIS — Z9889 Other specified postprocedural states: Secondary | ICD-10-CM

## 2015-10-07 DIAGNOSIS — Z952 Presence of prosthetic heart valve: Secondary | ICD-10-CM

## 2015-10-07 DIAGNOSIS — Z8679 Personal history of other diseases of the circulatory system: Secondary | ICD-10-CM

## 2015-10-07 DIAGNOSIS — I440 Atrioventricular block, first degree: Secondary | ICD-10-CM | POA: Diagnosis not present

## 2015-10-07 DIAGNOSIS — Z5181 Encounter for therapeutic drug level monitoring: Secondary | ICD-10-CM

## 2015-10-07 DIAGNOSIS — I4891 Unspecified atrial fibrillation: Secondary | ICD-10-CM | POA: Diagnosis not present

## 2015-10-07 LAB — POCT INR: INR: 3.3

## 2015-10-07 NOTE — Progress Notes (Signed)
Patient ID: [name] [name], female   DOB: 10-14-1951, 64 y.o.   MRN: [id number]     Cardiology Office Note   Date:  10/07/2015   ID:  [name] [name], DOB Nov 13, 1950, MRN [id number]  PCP:  Elba Barman, MD  Cardiologist:  Dorothy Spark, MD   Chief complain: 1 year follow up   History of Present Illness: [name] [name] is a 64 y.o. female who presents for follow up for MVR and paroxysmal atrial fibrillation. She underwent MVR with a mechanical mitral valve in 2014 and also a maze procedure. She had 1 episode of a-fib prior to the surgery but not since then. She remains very active with 5 x exercise per week (cardiac rehab and Club with personal trainer) and denies chest pain, DOE, orthopnea, PND, LE edema.  No bleeding with Warfarin. She has a holistic doctor and is looking for a PCP. Occasional dizziness with rapid head turn, no syncope. She occasionally gets sharp left sided chest pain not exertional, no other associated symptoms, not felt in several months.  Past Medical History  Diagnosis Date  . Multiple sclerosis ([place])   . Breast cancer ([place])   . Ovarian cyst   . Hypothyroidism   . Mitral valve regurgitation     Mitral valve replacement April, 2013, Mitral valve prolapse  . CHF (congestive heart failure) ([place])     Related to severe mitral regurgitation, April, 2013  . Shortness of breath     Related to severe mitral regurgitation, April, 2013  . Paroxysmal atrial fibrillation ([facility])     Rapid atrial fibrillation in-hospital, Rapid cardioversion,  before mitral valve surgery  . Neuromuscular disorder ([facility])     ms  . Arthritis     knees  . 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 S/P Maze operation for atrial fibrillation 01/26/2012    Complete biatrial lesion set using cryothermy via right mini thoracotomy  . Pulmonary hypertension ([place])     Echo, April, 2013, before mitral valve  surgery  . 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 Assess by pulmonary team in the hospital April, 2013  . Anxiety   . Neurogenic bladder   . Ejection fraction     EF 60%, echo, April, 2013, with severe MR before mitral valve replacement  . Warfarin anticoagulation     Mechanical mitral prosthesis, April, 20136    Past Surgical History  Procedure Laterality Date  . Breast lumpectomy    . Lymphadenectomy    . Wrist surgery    . Hernia repair    . Tonsillectomy    . Cystoscopy    . Laparoscopy    . Tee without cardioversion  01/19/2012    Procedure: TRANSESOPHAGEAL ECHOCARDIOGRAM (TEE);  Surgeon: Peter M Martinique, MD;  Location: [facility] ENDOSCOPY;  Service: Cardiovascular;  Laterality: N/A;  . Maze  01/26/2012    Procedure: MAZE;  Surgeon: Rexene Alberts, MD;  Location: [place];  Service: Open Heart Surgery;  Laterality: N/A;  . Mitral valve replacement  01/26/2012    Procedure: MINIMALLY INVASIVE MITRAL VALVE (MV) REPLACEMENT;  Surgeon: Rexene Alberts, MD;  Location: [place];  Service: Open Heart Surgery;  Laterality: Right;  . Chest tube insertion  01/26/2012    Procedure: CHEST TUBE INSERTION;  Surgeon: Rexene Alberts, MD;  Location: [place];  Service: Open Heart Surgery;  Laterality: Left;  .  Left and right heart catheterization with coronary angiogram N/A 01/20/2012    Procedure: LEFT AND RIGHT HEART CATHETERIZATION WITH CORONARY ANGIOGRAM;  Surgeon: Burnell Blanks, MD;  Location: [facility] CATH LAB;  Service: Cardiovascular;  Laterality: N/A;     Current Outpatient Prescriptions  Medication Sig Dispense Refill  . Black Cohosh 40 MG CAPS Take 1 capsule by mouth daily.    . Bromelains (BROMELAIN PO) Take 1 capsule by mouth 2 (two) times daily.    . Cholecalciferol (VITAMIN D) 2000 UNITS CAPS Take 6,000 Units by mouth daily.    . Coenzyme Q10 (CO Q-10) 100 MG CAPS Take 1 capsule by mouth daily.     . Cranberry 500 MG CAPS Take 1 capsule by mouth daily.    Marland Kitchen  L-THEANINE PO Take 1 capsule by mouth daily.     Marland Kitchen lactose free nutrition (BOOST PLUS) LIQD Take 237 mLs by mouth daily.     Marland Kitchen lidocaine-prilocaine (EMLA) cream Apply 1 application topically as needed (per pt this is for use before blood draws).   0  . Lysine 500 MG CAPS Take 1 capsule by mouth daily.    . metoprolol tartrate (LOPRESSOR) 25 MG tablet Take 1 tablet (25 mg total) by mouth 2 (two) times daily. 180 tablet 3  . MILK THISTLE PO Take 1 capsule by mouth 2 (two) times daily.    . Misc Natural Products (GLUCOSAMINE CHOND COMPLEX/MSM PO) Strength of dose Glucosamine 1500mg  /Chodroitron 1000mg / MSM 500mg  takes one twice daily    . Olive Leaf 500 MG CAPS Take 1 capsule by mouth 2 (two) times daily.     Marland Kitchen OVER THE COUNTER MEDICATION Red marine algae - 1 capsules daily    . Petasin (PETADOLEX PO) Take by mouth 2 (two) times daily.    . Probiotic Product (PROBIOTIC DAILY PO) Take 1 capsule once a day    . PROGESTERONE MICRONIZED PO Take 75 mg by mouth daily.     Marland Kitchen thyroid (ARMOUR) 60 MG tablet Take 60 mg by mouth daily.     . TURMERIC PO Take by mouth.    . vitamin A 10000 UNIT capsule Take 10,000 Units by mouth daily.    . vitamin E 400 UNIT capsule Take 400 Units by mouth daily.    Marland Kitchen warfarin (COUMADIN) 5 MG tablet Take as directed by cooumadin clinic 120 tablet 1   No current facility-administered medications for this visit.   Allergies:   Erythromycin   Social History:  The patient  reports that she has never smoked. She has never used smokeless tobacco. She reports that she does not drink alcohol or use illicit drugs.   Family History:  The patient's family history includes CAD in her mother; Heart disease in her father.    ROS:  Please see the history of present illness.   Otherwise, review of systems are positive for none.   All other systems are reviewed and negative.   PHYSICAL EXAM: VS:  BP 124/72 mmHg  Pulse 79  Ht 5' 9.5" (1.765 m)  Wt 147 lb (66.679 kg)  BMI 21.40  kg/m2 , BMI Body mass index is 21.4 kg/(m^2). GEN: Well nourished, well developed, in no acute distress HEENT: normal Neck: no JVD, carotid bruits, or masses Cardiac: RRR; no murmurs, MV click, no rubs, or gallops,no edema  Respiratory:  clear to auscultation bilaterally, normal work of breathing GI: soft, nontender, nondistended, + BS MS: no deformity or atrophy Skin: warm and dry, no rash  Neuro:  Strength and sensation are intact Psych: euthymic mood, full affect  EKG:  EKG is ordered today. The ekg ordered today demonstrates SR, LVH, otherwise normal   Recent Labs: No results found for requested labs within last 365 days.   Lipid Panel No results found for: CHOL, TRIG, HDL, CHOLHDL, VLDL, LDLCALC, LDLDIRECT   Wt Readings from Last 3 Encounters:  10/07/15 147 lb (66.679 kg)  03/25/15 146 lb 1.9 oz (66.28 kg)  09/26/14 145 lb (65.772 kg)    TTE: 2014 Left ventricle: The cavity size was normal. Wall thickness was normal. Systolic function was normal. The estimated ejection fraction was in the range of 50% to 55%. Wall motion was normal; there were no regional wall motion abnormalities. - Mitral valve: A mechanical prosthesis was present. Normal gradients.    ASSESSMENT AND PLAN:  1. S/P MVR - mechanical valve, functioning well on echo post surgery in 2014, we will recheck. Continue coumadin, no bleeding.  2. PAF - s/p MAZE procedure, asymptomatic, on coumadin.  3. Chest pain - sharp, occasional, most probably adhesion post surgery.  Follow up in 1 year.  Signed, Dorothy Spark, MD  10/07/2015 2:26 PM    [facility] [address], [place], [place]  [address] Phone: [phone]; Fax: [phone]

## 2015-10-07 NOTE — Patient Instructions (Addendum)
Medication Instructions:   Your physician recommends that you continue on your current medications as directed. Please refer to the Current Medication list given to you today.   Your physician has requested that you have an echocardiogram. Echocardiography is a painless test that uses sound waves to create images of your heart. It provides your doctor with information about the size and shape of your heart and how well your heart's chambers and valves are working. This procedure takes approximately one hour. There are no restrictions for this procedure.    Follow-Up:  Your physician wants you to follow-up in: [place] will receive a reminder letter in the mail two months in advance. If you don't receive a letter, please call our office to schedule the follow-up appointment.      If you need a refill on your cardiac medications before your next appointment, please call your pharmacy.

## 2015-10-09 ENCOUNTER — Encounter (HOSPITAL_COMMUNITY): Payer: Self-pay

## 2015-10-11 ENCOUNTER — Encounter (HOSPITAL_COMMUNITY)
Admission: RE | Admit: 2015-10-11 | Discharge: 2015-10-11 | Disposition: A | Discharge status: Home / Self Care | Payer: Self-pay | Source: Ambulatory Visit | Attending: Cardiology | Admitting: Cardiology

## 2015-10-16 ENCOUNTER — Encounter (HOSPITAL_COMMUNITY): Payer: Self-pay

## 2015-10-18 ENCOUNTER — Encounter (HOSPITAL_COMMUNITY): Payer: Self-pay

## 2015-10-23 ENCOUNTER — Ambulatory Visit (HOSPITAL_COMMUNITY): Payer: Medicare Other | Attending: Cardiology

## 2015-10-23 ENCOUNTER — Encounter (HOSPITAL_COMMUNITY): Payer: Medicare Other

## 2015-10-23 ENCOUNTER — Other Ambulatory Visit: Payer: Self-pay

## 2015-10-23 DIAGNOSIS — Z954 Presence of other heart-valve replacement: Secondary | ICD-10-CM | POA: Insufficient documentation

## 2015-10-23 DIAGNOSIS — Z5181 Encounter for therapeutic drug level monitoring: Secondary | ICD-10-CM | POA: Insufficient documentation

## 2015-10-23 DIAGNOSIS — I4891 Unspecified atrial fibrillation: Secondary | ICD-10-CM | POA: Insufficient documentation

## 2015-10-23 DIAGNOSIS — I509 Heart failure, unspecified: Secondary | ICD-10-CM | POA: Insufficient documentation

## 2015-10-23 DIAGNOSIS — Z7901 Long term (current) use of anticoagulants: Secondary | ICD-10-CM | POA: Insufficient documentation

## 2015-10-23 DIAGNOSIS — Z8249 Family history of ischemic heart disease and other diseases of the circulatory system: Secondary | ICD-10-CM | POA: Diagnosis not present

## 2015-10-23 DIAGNOSIS — I517 Cardiomegaly: Secondary | ICD-10-CM | POA: Insufficient documentation

## 2015-10-23 DIAGNOSIS — Z9889 Other specified postprocedural states: Secondary | ICD-10-CM | POA: Insufficient documentation

## 2015-10-23 DIAGNOSIS — Z952 Presence of prosthetic heart valve: Secondary | ICD-10-CM

## 2015-10-24 DIAGNOSIS — R350 Frequency of micturition: Secondary | ICD-10-CM | POA: Diagnosis not present

## 2015-10-24 DIAGNOSIS — N3941 Urge incontinence: Secondary | ICD-10-CM | POA: Diagnosis not present

## 2015-10-25 ENCOUNTER — Encounter (HOSPITAL_COMMUNITY): Payer: Self-pay

## 2015-10-28 ENCOUNTER — Encounter (HOSPITAL_COMMUNITY): Payer: Self-pay

## 2015-10-30 ENCOUNTER — Encounter (HOSPITAL_COMMUNITY): Payer: Self-pay

## 2015-11-01 ENCOUNTER — Encounter (HOSPITAL_COMMUNITY)
Admission: RE | Admit: 2015-11-01 | Discharge: 2015-11-01 | Disposition: A | Discharge status: Home / Self Care | Payer: Self-pay | Source: Ambulatory Visit | Attending: Cardiology | Admitting: Cardiology

## 2015-11-04 ENCOUNTER — Ambulatory Visit (INDEPENDENT_AMBULATORY_CARE_PROVIDER_SITE_OTHER): Payer: Medicare Other | Admitting: Pharmacist

## 2015-11-04 ENCOUNTER — Encounter (HOSPITAL_COMMUNITY)
Admission: RE | Admit: 2015-11-04 | Discharge: 2015-11-04 | Disposition: A | Discharge status: Home / Self Care | Payer: Self-pay | Source: Ambulatory Visit | Attending: Cardiology | Admitting: Cardiology

## 2015-11-04 DIAGNOSIS — Z9889 Other specified postprocedural states: Secondary | ICD-10-CM

## 2015-11-04 DIAGNOSIS — Z954 Presence of other heart-valve replacement: Secondary | ICD-10-CM

## 2015-11-04 DIAGNOSIS — Z952 Presence of prosthetic heart valve: Secondary | ICD-10-CM

## 2015-11-04 DIAGNOSIS — Z8679 Personal history of other diseases of the circulatory system: Secondary | ICD-10-CM

## 2015-11-04 DIAGNOSIS — I4891 Unspecified atrial fibrillation: Secondary | ICD-10-CM | POA: Diagnosis not present

## 2015-11-04 DIAGNOSIS — Z7901 Long term (current) use of anticoagulants: Secondary | ICD-10-CM | POA: Diagnosis not present

## 2015-11-04 DIAGNOSIS — I509 Heart failure, unspecified: Secondary | ICD-10-CM

## 2015-11-04 DIAGNOSIS — Z5181 Encounter for therapeutic drug level monitoring: Secondary | ICD-10-CM

## 2015-11-04 LAB — POCT INR: INR: 3.2

## 2015-11-04 MED ORDER — WARFARIN SODIUM 5 MG PO TABS: ORAL_TABLET | ORAL | Status: DC

## 2015-11-06 ENCOUNTER — Encounter (HOSPITAL_COMMUNITY): Payer: Self-pay

## 2015-11-08 ENCOUNTER — Encounter (HOSPITAL_COMMUNITY)
Admission: RE | Admit: 2015-11-08 | Discharge: 2015-11-08 | Disposition: A | Discharge status: Home / Self Care | Payer: Self-pay | Source: Ambulatory Visit | Attending: Cardiology | Admitting: Cardiology

## 2015-11-11 ENCOUNTER — Encounter (HOSPITAL_COMMUNITY)
Admission: RE | Admit: 2015-11-11 | Discharge: 2015-11-11 | Disposition: A | Discharge status: Home / Self Care | Payer: Self-pay | Source: Ambulatory Visit | Attending: Cardiology | Admitting: Cardiology

## 2015-11-13 ENCOUNTER — Encounter (HOSPITAL_COMMUNITY): Payer: Self-pay

## 2015-11-14 DIAGNOSIS — R350 Frequency of micturition: Secondary | ICD-10-CM | POA: Diagnosis not present

## 2015-11-14 DIAGNOSIS — N3941 Urge incontinence: Secondary | ICD-10-CM | POA: Diagnosis not present

## 2015-11-15 ENCOUNTER — Encounter (HOSPITAL_COMMUNITY)
Admission: RE | Admit: 2015-11-15 | Discharge: 2015-11-15 | Disposition: A | Discharge status: Home / Self Care | Payer: Self-pay | Source: Ambulatory Visit | Attending: Cardiology | Admitting: Cardiology

## 2015-11-15 ENCOUNTER — Telehealth: Payer: Self-pay | Admitting: Cardiology

## 2015-11-15 NOTE — Telephone Encounter (Signed)
Left message to call back  

## 2015-11-15 NOTE — Telephone Encounter (Signed)
Patient st [facility] gave her an Rx for Myrbetriq (unknown dosage) for overactive bladder. She did some research and is concerned the medication might increase the effects of her Lopressor.  Before filling, she wants to check with Dr. Meda Coffee to make sure it will not interfere with her cardiac medications.   To Dr. Meda Coffee and Gay Filler, [facility].

## 2015-11-15 NOTE — Telephone Encounter (Signed)
New message    Patient wants the MD to look at her current medication to see if she can take Myretrick.

## 2015-11-15 NOTE — Telephone Encounter (Signed)
Per patient request, left message that it is fine for her to fill new medication.  Instructed her to call back with any questions or concerns.

## 2015-11-15 NOTE — Telephone Encounter (Signed)
Myrbetriq is a Beta-3 agonist, which has no effect on cardiac function.  There was a small group of patients who had an increase in blood pressure, but it was ~1 mmHg different.  There should be no reason she cannot take these together.

## 2015-11-18 ENCOUNTER — Encounter (HOSPITAL_COMMUNITY)
Admission: RE | Admit: 2015-11-18 | Discharge: 2015-11-18 | Disposition: A | Discharge status: Home / Self Care | Payer: Self-pay | Source: Ambulatory Visit | Attending: Cardiology | Admitting: Cardiology

## 2015-11-20 ENCOUNTER — Encounter (HOSPITAL_COMMUNITY): Payer: Medicare Other

## 2015-11-20 DIAGNOSIS — I4891 Unspecified atrial fibrillation: Secondary | ICD-10-CM | POA: Insufficient documentation

## 2015-11-20 DIAGNOSIS — Z5181 Encounter for therapeutic drug level monitoring: Secondary | ICD-10-CM | POA: Insufficient documentation

## 2015-11-20 DIAGNOSIS — Z7901 Long term (current) use of anticoagulants: Secondary | ICD-10-CM | POA: Insufficient documentation

## 2015-11-20 DIAGNOSIS — I509 Heart failure, unspecified: Secondary | ICD-10-CM | POA: Insufficient documentation

## 2015-11-20 DIAGNOSIS — Z9889 Other specified postprocedural states: Secondary | ICD-10-CM | POA: Insufficient documentation

## 2015-11-20 DIAGNOSIS — Z954 Presence of other heart-valve replacement: Secondary | ICD-10-CM | POA: Insufficient documentation

## 2015-11-22 ENCOUNTER — Encounter (HOSPITAL_COMMUNITY)
Admission: RE | Admit: 2015-11-22 | Discharge: 2015-11-22 | Disposition: A | Discharge status: Home / Self Care | Payer: Self-pay | Source: Ambulatory Visit | Attending: Cardiology | Admitting: Cardiology

## 2015-11-25 ENCOUNTER — Encounter (HOSPITAL_COMMUNITY)
Admission: RE | Admit: 2015-11-25 | Discharge: 2015-11-25 | Disposition: A | Discharge status: Home / Self Care | Payer: Self-pay | Source: Ambulatory Visit | Attending: Cardiology | Admitting: Cardiology

## 2015-11-27 ENCOUNTER — Encounter (HOSPITAL_COMMUNITY): Payer: Self-pay

## 2015-11-29 ENCOUNTER — Encounter (HOSPITAL_COMMUNITY)
Admission: RE | Admit: 2015-11-29 | Discharge: 2015-11-29 | Disposition: A | Discharge status: Home / Self Care | Payer: Self-pay | Source: Ambulatory Visit | Attending: Cardiology | Admitting: Cardiology

## 2015-12-02 ENCOUNTER — Encounter (HOSPITAL_COMMUNITY)
Admission: RE | Admit: 2015-12-02 | Discharge: 2015-12-02 | Disposition: A | Discharge status: Home / Self Care | Payer: Self-pay | Source: Ambulatory Visit | Attending: Cardiology | Admitting: Cardiology

## 2015-12-04 ENCOUNTER — Encounter (HOSPITAL_COMMUNITY): Payer: Self-pay

## 2015-12-05 DIAGNOSIS — R350 Frequency of micturition: Secondary | ICD-10-CM | POA: Diagnosis not present

## 2015-12-05 DIAGNOSIS — N3941 Urge incontinence: Secondary | ICD-10-CM | POA: Diagnosis not present

## 2015-12-06 ENCOUNTER — Encounter (HOSPITAL_COMMUNITY): Payer: Self-pay

## 2015-12-09 ENCOUNTER — Encounter (HOSPITAL_COMMUNITY)
Admission: RE | Admit: 2015-12-09 | Discharge: 2015-12-09 | Disposition: A | Discharge status: Home / Self Care | Payer: Self-pay | Source: Ambulatory Visit | Attending: Cardiology | Admitting: Cardiology

## 2015-12-11 ENCOUNTER — Encounter (HOSPITAL_COMMUNITY): Payer: Self-pay

## 2015-12-11 DIAGNOSIS — G35 Multiple sclerosis: Secondary | ICD-10-CM | POA: Diagnosis not present

## 2015-12-12 ENCOUNTER — Ambulatory Visit (INDEPENDENT_AMBULATORY_CARE_PROVIDER_SITE_OTHER): Payer: Medicare Other

## 2015-12-12 ENCOUNTER — Encounter: Payer: Self-pay | Admitting: Podiatry

## 2015-12-12 ENCOUNTER — Ambulatory Visit (INDEPENDENT_AMBULATORY_CARE_PROVIDER_SITE_OTHER): Payer: Medicare Other | Admitting: Podiatry

## 2015-12-12 VITALS — BP 133/76 | HR 68 | Resp 16 | Ht 69.0 in | Wt 143.0 lb

## 2015-12-12 DIAGNOSIS — M79671 Pain in right foot: Secondary | ICD-10-CM | POA: Diagnosis not present

## 2015-12-12 DIAGNOSIS — L84 Corns and callosities: Secondary | ICD-10-CM | POA: Diagnosis not present

## 2015-12-12 DIAGNOSIS — M79672 Pain in left foot: Secondary | ICD-10-CM

## 2015-12-12 DIAGNOSIS — M2040 Other hammer toe(s) (acquired), unspecified foot: Secondary | ICD-10-CM

## 2015-12-12 NOTE — Progress Notes (Signed)
   Subjective:    Patient ID: [name] [name], female    DOB: 1951-01-17, 65 y.o.   MRN: [id number]  HPI Patient presents with a bilateral nail problem; Bilateral; great toe; nail discoloration & thickened nail.  Patient presents with foot pain in their right foot; 2nd toe, pt stated, "has a history of posterior tibial tendinitis; think broke toe on a door jam October 2016."  Patient also presents with a callous on their Right foot; 3rd toe-top of toe.      Review of Systems  HENT: Positive for sinus pressure.   All other systems reviewed and are negative.      Objective:   Physical Exam        Assessment & Plan:

## 2015-12-13 ENCOUNTER — Encounter (HOSPITAL_COMMUNITY)
Admission: RE | Admit: 2015-12-13 | Discharge: 2015-12-13 | Disposition: A | Discharge status: Home / Self Care | Payer: Self-pay | Source: Ambulatory Visit | Attending: Cardiology | Admitting: Cardiology

## 2015-12-15 NOTE — Progress Notes (Signed)
Subjective:     Patient ID: [name] [name], female   DOB: [birth year], 65 y.o.   MRN: [id number]  HPI patient presents with numerous concerns including nail disease hammertoe deformity tendinitis trauma to the right foot and lesions on the ends of the right second and third toe   Review of Systems  All other systems reviewed and are negative.      Objective:   Physical Exam  Constitutional: She is oriented to person, place, and time.  Cardiovascular: Intact distal pulses.   Musculoskeletal: Normal range of motion.  Neurological: She is oriented to person, place, and time.  Skin: Skin is warm.  Nursing note and vitals reviewed.  neurovascular status intact muscle strength adequate range of motion within normal limits with patient found to have significant keratotic lesions digits 23 right and structural hammertoe deformity along with nail disease of the big toenails     Assessment:     Multiple problems including trauma hammertoe deformity and nail disease    Plan:     H&P x-rays reviewed and all conditions discussed. Today debridement accomplished along with padding and I discussed topical antifungal treatments with consideration for oral if symptoms get worse. Patient will be seen back depending on how she responds to conservative care  X-ray report indicates there is significant structural deformity of the lesser digits but no signs of fracture of the foot

## 2015-12-16 ENCOUNTER — Ambulatory Visit (INDEPENDENT_AMBULATORY_CARE_PROVIDER_SITE_OTHER): Payer: Medicare Other | Admitting: *Deleted

## 2015-12-16 ENCOUNTER — Encounter (HOSPITAL_COMMUNITY): Payer: Self-pay

## 2015-12-16 DIAGNOSIS — Z7901 Long term (current) use of anticoagulants: Secondary | ICD-10-CM

## 2015-12-16 DIAGNOSIS — Z954 Presence of other heart-valve replacement: Secondary | ICD-10-CM

## 2015-12-16 DIAGNOSIS — Z8679 Personal history of other diseases of the circulatory system: Secondary | ICD-10-CM

## 2015-12-16 DIAGNOSIS — I4891 Unspecified atrial fibrillation: Secondary | ICD-10-CM

## 2015-12-16 DIAGNOSIS — Z952 Presence of prosthetic heart valve: Secondary | ICD-10-CM

## 2015-12-16 DIAGNOSIS — Z5181 Encounter for therapeutic drug level monitoring: Secondary | ICD-10-CM

## 2015-12-16 DIAGNOSIS — Z9889 Other specified postprocedural states: Secondary | ICD-10-CM

## 2015-12-16 DIAGNOSIS — I509 Heart failure, unspecified: Secondary | ICD-10-CM | POA: Diagnosis not present

## 2015-12-16 LAB — POCT INR: INR: 3.6

## 2015-12-18 ENCOUNTER — Encounter (HOSPITAL_COMMUNITY): Payer: Self-pay

## 2015-12-18 DIAGNOSIS — I4891 Unspecified atrial fibrillation: Secondary | ICD-10-CM | POA: Insufficient documentation

## 2015-12-18 DIAGNOSIS — I509 Heart failure, unspecified: Secondary | ICD-10-CM | POA: Insufficient documentation

## 2015-12-18 DIAGNOSIS — Z7901 Long term (current) use of anticoagulants: Secondary | ICD-10-CM | POA: Insufficient documentation

## 2015-12-18 DIAGNOSIS — Z954 Presence of other heart-valve replacement: Secondary | ICD-10-CM | POA: Insufficient documentation

## 2015-12-18 DIAGNOSIS — Z9889 Other specified postprocedural states: Secondary | ICD-10-CM | POA: Insufficient documentation

## 2015-12-18 DIAGNOSIS — Z5181 Encounter for therapeutic drug level monitoring: Secondary | ICD-10-CM | POA: Insufficient documentation

## 2015-12-20 ENCOUNTER — Encounter (HOSPITAL_COMMUNITY)
Admission: RE | Admit: 2015-12-20 | Discharge: 2015-12-20 | Disposition: A | Discharge status: Home / Self Care | Payer: Self-pay | Source: Ambulatory Visit | Attending: Cardiology | Admitting: Cardiology

## 2015-12-23 ENCOUNTER — Encounter (HOSPITAL_COMMUNITY)
Admission: RE | Admit: 2015-12-23 | Discharge: 2015-12-23 | Disposition: A | Discharge status: Home / Self Care | Payer: Self-pay | Source: Ambulatory Visit | Attending: Cardiology | Admitting: Cardiology

## 2015-12-25 ENCOUNTER — Encounter (HOSPITAL_COMMUNITY): Payer: Self-pay

## 2015-12-26 DIAGNOSIS — R350 Frequency of micturition: Secondary | ICD-10-CM | POA: Diagnosis not present

## 2015-12-26 DIAGNOSIS — N3941 Urge incontinence: Secondary | ICD-10-CM | POA: Diagnosis not present

## 2015-12-27 ENCOUNTER — Encounter (HOSPITAL_COMMUNITY)
Admission: RE | Admit: 2015-12-27 | Discharge: 2015-12-27 | Disposition: A | Discharge status: Home / Self Care | Payer: Self-pay | Source: Ambulatory Visit | Attending: Cardiology | Admitting: Cardiology

## 2015-12-30 ENCOUNTER — Encounter (HOSPITAL_COMMUNITY): Payer: Self-pay

## 2016-01-01 ENCOUNTER — Encounter (HOSPITAL_COMMUNITY): Payer: Self-pay

## 2016-01-03 ENCOUNTER — Encounter (HOSPITAL_COMMUNITY)
Admission: RE | Admit: 2016-01-03 | Discharge: 2016-01-03 | Disposition: A | Discharge status: Home / Self Care | Payer: Self-pay | Source: Ambulatory Visit | Attending: Cardiology | Admitting: Cardiology

## 2016-01-03 NOTE — Progress Notes (Signed)
Pt in today for cardiac rehab maintenance.  Pt noted that her HR was 48.  Pt palpated her pulse for one minute.  Pt placed on zoll showed sr 78 with frequent pvc's.  Pt reported that she started a new medication - Mybetriq for overactive bladder.  Pt began this new medication this week.(this is her first day in exercise since starting the new med)  Pt also reports that she read on 'webmd' that this medication can intensify the effects of metoprolol. Pt noted that she felt dizzy after taking this medication. Presently pt is symptom free and able to proceed with exercise. [facility] and spoke with triage nurse.  Updated the nurse with pt symptoms. Nurse to confer with Dr. Bobbye Charleston and call pt at home.  Pt advised to hold this medication until she hears back from the urologist office.  Pt verbalizes understanding and is in full agreement of this. Note sent over to Dr. Meda Coffee as Juluis Rainier. Pt feels ok to drive self home.  Will follow up on Monday. Cherre Huger, BSN

## 2016-01-06 ENCOUNTER — Encounter (HOSPITAL_COMMUNITY)
Admission: RE | Admit: 2016-01-06 | Discharge: 2016-01-06 | Disposition: A | Discharge status: Home / Self Care | Payer: Self-pay | Source: Ambulatory Visit | Attending: Cardiology | Admitting: Cardiology

## 2016-01-08 ENCOUNTER — Encounter (HOSPITAL_COMMUNITY): Payer: Self-pay

## 2016-01-10 ENCOUNTER — Encounter (HOSPITAL_COMMUNITY): Payer: Self-pay

## 2016-01-13 ENCOUNTER — Encounter (HOSPITAL_COMMUNITY)
Admission: RE | Admit: 2016-01-13 | Discharge: 2016-01-13 | Disposition: A | Discharge status: Home / Self Care | Payer: Self-pay | Source: Ambulatory Visit | Attending: Cardiology | Admitting: Cardiology

## 2016-01-15 ENCOUNTER — Encounter (HOSPITAL_COMMUNITY): Payer: Self-pay

## 2016-01-16 DIAGNOSIS — N3941 Urge incontinence: Secondary | ICD-10-CM | POA: Diagnosis not present

## 2016-01-17 ENCOUNTER — Encounter (HOSPITAL_COMMUNITY)
Admission: RE | Admit: 2016-01-17 | Discharge: 2016-01-17 | Disposition: A | Discharge status: Home / Self Care | Payer: Self-pay | Source: Ambulatory Visit | Attending: Cardiology | Admitting: Cardiology

## 2016-01-20 ENCOUNTER — Encounter (HOSPITAL_COMMUNITY)
Admission: RE | Admit: 2016-01-20 | Discharge: 2016-01-20 | Disposition: A | Discharge status: Home / Self Care | Payer: Self-pay | Source: Ambulatory Visit | Attending: Cardiology | Admitting: Cardiology

## 2016-01-20 DIAGNOSIS — Z952 Presence of prosthetic heart valve: Secondary | ICD-10-CM | POA: Insufficient documentation

## 2016-01-22 ENCOUNTER — Encounter (HOSPITAL_COMMUNITY): Payer: Self-pay

## 2016-01-24 ENCOUNTER — Encounter (HOSPITAL_COMMUNITY)
Admission: RE | Admit: 2016-01-24 | Discharge: 2016-01-24 | Disposition: A | Discharge status: Home / Self Care | Payer: Self-pay | Source: Ambulatory Visit | Attending: Cardiology | Admitting: Cardiology

## 2016-01-27 ENCOUNTER — Encounter (HOSPITAL_COMMUNITY)
Admission: RE | Admit: 2016-01-27 | Discharge: 2016-01-27 | Disposition: A | Discharge status: Home / Self Care | Payer: Self-pay | Source: Ambulatory Visit | Attending: Cardiology | Admitting: Cardiology

## 2016-01-27 ENCOUNTER — Ambulatory Visit (INDEPENDENT_AMBULATORY_CARE_PROVIDER_SITE_OTHER): Payer: Medicare Other | Admitting: *Deleted

## 2016-01-27 DIAGNOSIS — Z7901 Long term (current) use of anticoagulants: Secondary | ICD-10-CM | POA: Diagnosis not present

## 2016-01-27 DIAGNOSIS — Z5181 Encounter for therapeutic drug level monitoring: Secondary | ICD-10-CM | POA: Diagnosis not present

## 2016-01-27 DIAGNOSIS — I4891 Unspecified atrial fibrillation: Secondary | ICD-10-CM

## 2016-01-27 DIAGNOSIS — Z952 Presence of prosthetic heart valve: Secondary | ICD-10-CM

## 2016-01-27 DIAGNOSIS — Z8679 Personal history of other diseases of the circulatory system: Secondary | ICD-10-CM

## 2016-01-27 DIAGNOSIS — Z9889 Other specified postprocedural states: Secondary | ICD-10-CM

## 2016-01-27 DIAGNOSIS — Z954 Presence of other heart-valve replacement: Secondary | ICD-10-CM

## 2016-01-27 DIAGNOSIS — I509 Heart failure, unspecified: Secondary | ICD-10-CM

## 2016-01-27 LAB — POCT INR: INR: 2.9

## 2016-01-29 ENCOUNTER — Encounter (HOSPITAL_COMMUNITY): Payer: Self-pay

## 2016-01-31 ENCOUNTER — Encounter (HOSPITAL_BASED_OUTPATIENT_CLINIC_OR_DEPARTMENT_OTHER): Payer: Self-pay

## 2016-01-31 ENCOUNTER — Emergency Department (HOSPITAL_BASED_OUTPATIENT_CLINIC_OR_DEPARTMENT_OTHER): Payer: Medicare Other

## 2016-01-31 ENCOUNTER — Emergency Department (HOSPITAL_BASED_OUTPATIENT_CLINIC_OR_DEPARTMENT_OTHER)
Admission: EM | Admit: 2016-01-31 | Discharge: 2016-01-31 | Disposition: A | Discharge status: Home / Self Care | Payer: Medicare Other | Attending: Emergency Medicine | Admitting: Emergency Medicine

## 2016-01-31 ENCOUNTER — Encounter (HOSPITAL_COMMUNITY): Admission: RE | Admit: 2016-01-31 | Payer: Self-pay | Source: Ambulatory Visit

## 2016-01-31 DIAGNOSIS — M25562 Pain in left knee: Secondary | ICD-10-CM | POA: Diagnosis not present

## 2016-01-31 DIAGNOSIS — W010XXA Fall on same level from slipping, tripping and stumbling without subsequent striking against object, initial encounter: Secondary | ICD-10-CM | POA: Insufficient documentation

## 2016-01-31 DIAGNOSIS — Y929 Unspecified place or not applicable: Secondary | ICD-10-CM | POA: Diagnosis not present

## 2016-01-31 DIAGNOSIS — Y999 Unspecified external cause status: Secondary | ICD-10-CM | POA: Diagnosis not present

## 2016-01-31 DIAGNOSIS — S0990XA Unspecified injury of head, initial encounter: Secondary | ICD-10-CM | POA: Diagnosis not present

## 2016-01-31 DIAGNOSIS — Y939 Activity, unspecified: Secondary | ICD-10-CM | POA: Diagnosis not present

## 2016-01-31 DIAGNOSIS — R51 Headache: Secondary | ICD-10-CM | POA: Diagnosis not present

## 2016-01-31 DIAGNOSIS — J449 Chronic obstructive pulmonary disease, unspecified: Secondary | ICD-10-CM | POA: Diagnosis not present

## 2016-01-31 DIAGNOSIS — W19XXXA Unspecified fall, initial encounter: Secondary | ICD-10-CM

## 2016-01-31 DIAGNOSIS — I110 Hypertensive heart disease with heart failure: Secondary | ICD-10-CM | POA: Insufficient documentation

## 2016-01-31 DIAGNOSIS — S8262XA Displaced fracture of lateral malleolus of left fibula, initial encounter for closed fracture: Secondary | ICD-10-CM | POA: Diagnosis not present

## 2016-01-31 DIAGNOSIS — I509 Heart failure, unspecified: Secondary | ICD-10-CM | POA: Insufficient documentation

## 2016-01-31 DIAGNOSIS — Z853 Personal history of malignant neoplasm of breast: Secondary | ICD-10-CM | POA: Diagnosis not present

## 2016-01-31 DIAGNOSIS — S82892A Other fracture of left lower leg, initial encounter for closed fracture: Secondary | ICD-10-CM

## 2016-01-31 DIAGNOSIS — I480 Paroxysmal atrial fibrillation: Secondary | ICD-10-CM | POA: Insufficient documentation

## 2016-01-31 DIAGNOSIS — S8992XA Unspecified injury of left lower leg, initial encounter: Secondary | ICD-10-CM | POA: Diagnosis not present

## 2016-01-31 NOTE — ED Provider Notes (Signed)
CSN: [id number]     Arrival date & time 01/31/16  1531 History   First MD Initiated Contact with Patient 01/31/16 1708     Chief Complaint  Patient presents with  . Fall    (Consider location/radiation/quality/duration/timing/severity/associated sxs/prior Treatment) HPI Comments: Twisted leg after getting out of car and fell on left side and head yesterday Yesterday had goose egg on head, now it's better, now no headaches No LOC Neighbors helped her get up Has been ambulating with walker Pain in left ankle and left knee Mild-moderate pain, worse with palpation No other areas of pain, no neck pain, no numbness/weakness      Patient is a 65 y.o. female presenting with fall.  Fall Pertinent negatives include no chest pain, no abdominal pain, no headaches (yesterday not better) and no shortness of breath.    Past Medical History  Diagnosis Date  . Multiple sclerosis ([place])   . Breast cancer ([place])   . Ovarian cyst   . Hypothyroidism   . Mitral valve regurgitation     Mitral valve replacement April, 2013, Mitral valve prolapse  . CHF (congestive heart failure) ([place])     Related to severe mitral regurgitation, April, 2013  . Shortness of breath     Related to severe mitral regurgitation, April, 2013  . Paroxysmal atrial fibrillation (HCC)     Rapid atrial fibrillation in-hospital, Rapid cardioversion,  before mitral valve surgery  . Neuromuscular disorder (HCC)     ms  . Arthritis     knees  . 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 S/P Maze operation for atrial fibrillation 01/26/2012    Complete biatrial lesion set using cryothermy via right mini thoracotomy  . Pulmonary hypertension ([place])     Echo, April, 2013, before mitral valve surgery  . 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 Assess by pulmonary team in the hospital April, 2013  . Anxiety   .  Neurogenic bladder   . Ejection fraction     EF 60%, echo, April, 2013, with severe MR before mitral valve replacement  . Warfarin anticoagulation     Mechanical mitral prosthesis, April, 20136  . Osteopenia    Past Surgical History  Procedure Laterality Date  . Breast lumpectomy    . Lymphadenectomy    . Wrist surgery    . Hernia repair    . Tonsillectomy    . Cystoscopy    . Laparoscopy    . Tee without cardioversion  01/19/2012    Procedure: TRANSESOPHAGEAL ECHOCARDIOGRAM (TEE);  Surgeon: Peter M Martinique, MD;  Location: [facility] ENDOSCOPY;  Service: Cardiovascular;  Laterality: N/A;  . Maze  01/26/2012    Procedure: MAZE;  Surgeon: Rexene Alberts, MD;  Location: [place];  Service: Open Heart Surgery;  Laterality: N/A;  . Mitral valve replacement  01/26/2012    Procedure: MINIMALLY INVASIVE MITRAL VALVE (MV) REPLACEMENT;  Surgeon: Rexene Alberts, MD;  Location: [place];  Service: Open Heart Surgery;  Laterality: Right;  . Chest tube insertion  01/26/2012    Procedure: CHEST TUBE INSERTION;  Surgeon: Rexene Alberts, MD;  Location: [place];  Service: Open Heart Surgery;  Laterality: Left;  . Left and right heart catheterization with coronary angiogram N/A 01/20/2012    Procedure: LEFT AND RIGHT HEART CATHETERIZATION WITH CORONARY ANGIOGRAM;  Surgeon: Burnell Blanks, MD;  Location: [facility] CATH LAB;  Service:  Cardiovascular;  Laterality: N/A;   Family History  Problem Relation Age of Onset  . CAD    . Heart disease Father     cardiac arrest   . CAD Mother     5 stents and numerous bypass surgery   Social History  Substance Use Topics  . Smoking status: Never Smoker   . Smokeless tobacco: Never Used  . Alcohol Use: No   OB History    No data available     Review of Systems  Constitutional: Negative for fever.  HENT: Negative for sore throat.   Eyes: Negative for visual disturbance.  Respiratory: Negative for cough and shortness of breath.   Cardiovascular: Negative for chest pain.   Gastrointestinal: Negative for abdominal pain.  Genitourinary: Negative for difficulty urinating.  Musculoskeletal: Positive for arthralgias and gait problem. Negative for back pain and neck pain.  Skin: Positive for pallor. Negative for rash.  Neurological: Negative for syncope and headaches (yesterday not better).      Allergies  Erythromycin  Home Medications   Prior to Admission medications   Medication Sig Start Date End Date Taking? Authorizing Provider  Black Cohosh 40 MG CAPS Take 1 capsule by mouth daily.    Historical Provider, MD  Bromelains (BROMELAIN PO) Take 1 capsule by mouth 2 (two) times daily.    Historical Provider, MD  Cholecalciferol (VITAMIN D) 2000 UNITS CAPS Take 6,000 Units by mouth daily.    Historical Provider, MD  Coenzyme Q10 (CO Q-10) 100 MG CAPS Take 1 capsule by mouth daily.     Historical Provider, MD  Cranberry 500 MG CAPS Take 1 capsule by mouth daily.    Historical Provider, MD  L-THEANINE PO Take 1 capsule by mouth daily.     Historical Provider, MD  lactose free nutrition (BOOST PLUS) LIQD Take 237 mLs by mouth daily.     Historical Provider, MD  lidocaine-prilocaine (EMLA) cream Apply 1 application topically as needed (per pt this is for use before blood draws).  03/06/15   Historical Provider, MD  Lysine 500 MG CAPS Take 1 capsule by mouth daily.    Historical Provider, MD  metoprolol tartrate (LOPRESSOR) 25 MG tablet Take 1 tablet (25 mg total) by mouth 2 (two) times daily. 03/25/15   [name], MD  MILK THISTLE PO Take 1 capsule by mouth 2 (two) times daily.    Historical Provider, MD  Misc Natural Products (GLUCOSAMINE CHOND COMPLEX/MSM PO) Strength of dose Glucosamine 1500mg  /Chodroitron 1000mg / MSM 500mg  takes one twice daily    Historical Provider, MD  Olive Leaf 500 MG CAPS Take 1 capsule by mouth 2 (two) times daily.     Historical Provider, MD  OVER THE COUNTER MEDICATION Red marine algae - 1 capsules daily    Historical Provider, MD   Petasin (PETADOLEX PO) Take by mouth 2 (two) times daily.    Historical Provider, MD  Probiotic Product (PROBIOTIC DAILY PO) Take 1 capsule once a day    Historical Provider, MD  PROGESTERONE MICRONIZED PO Take 75 mg by mouth daily.     Historical Provider, MD  thyroid (ARMOUR) 60 MG tablet Take 60 mg by mouth daily.     Historical Provider, MD  TURMERIC PO Take by mouth.    Historical Provider, MD  vitamin A 10000 UNIT capsule Take 10,000 Units by mouth daily.    Historical Provider, MD  vitamin E 400 UNIT capsule Take 400 Units by mouth daily.    Historical Provider,  MD  warfarin (COUMADIN) 5 MG tablet Take as directed by Coumadin clinic 11/04/15   Dorothy Spark, MD   BP 135/67 mmHg  Pulse 66  Temp(Src) 98.4 F (36.9 C) (Oral)  Resp 16  Ht 5\' 9"  (1.753 m)  Wt 143 lb (64.864 kg)  BMI 21.11 kg/m2  SpO2 100% Physical Exam  Constitutional: She is oriented to person, place, and time. She appears well-developed and well-nourished. No distress.  HENT:  Head: Normocephalic and atraumatic.  Eyes: Conjunctivae and EOM are normal.  Neck: Normal range of motion.  Cardiovascular: Normal rate, normal heart sounds and intact distal pulses.  An irregularly irregular rhythm present. Exam reveals no gallop and no friction rub.   No murmur heard. Pulmonary/Chest: Effort normal and breath sounds normal. No respiratory distress. She has no wheezes. She has no rales.  Abdominal: Soft. She exhibits no distension. There is no tenderness. There is no guarding.  Musculoskeletal: She exhibits no edema.       Left knee: She exhibits normal range of motion, no swelling, no effusion and no deformity.       Left ankle: She exhibits swelling and ecchymosis. She exhibits normal range of motion, no deformity, no laceration and normal pulse. Tenderness. Lateral malleolus, AITFL and proximal fibula tenderness found. No medial malleolus and no head of 5th metatarsal tenderness found.  Neurological: She is alert and  oriented to person, place, and time.  Skin: Skin is warm and dry. No rash noted. She is not diaphoretic. No erythema.  Nursing note and vitals reviewed.   ED Course  Procedures (including critical care time) Labs Review Labs Reviewed - No data to display  Imaging Review Dg Ankle Complete Left  01/31/2016  CLINICAL DATA:  Status post fall yesterday and is 38 and lateral knee and ankle pain EXAM: LEFT ANKLE COMPLETE - 3+ VIEW COMPARISON:  Left foot series of September 23, 2006. FINDINGS: The bones are mildly osteopenic. The ankle joint mortise is preserved. The talar dome is intact. There is faint calcification adjacent to the inferior lateral aspect of the lateral malleolus. This likely reflects a tiny avulsion. The talus and calcaneus appear intact. There is moderate soft tissue swelling anteriorly and laterally. IMPRESSION: Small avulsion fracture from the lateral cortex of the lateral malleolus with overlying soft tissue swelling. There is no disruption of the ankle joint mortise. Electronically Signed   By: David  Martinique M.D.   On: 01/31/2016 16:03   Ct Head Wo Contrast  01/31/2016  CLINICAL DATA:  Pain following fall one day prior EXAM: CT HEAD WITHOUT CONTRAST TECHNIQUE: Contiguous axial images were obtained from the base of the skull through the vertex without intravenous contrast. COMPARISON:  None. FINDINGS: There is mild diffuse atrophy. There is no intracranial mass, hemorrhage, extra-axial fluid collection, or midline shift. There is evidence of a prior white matter infarct in the anterior right centrum semiovale. Elsewhere there is mild patchy small vessel disease in the centra semiovale bilaterally. No acute infarct evident. The bony calvarium appears intact. The mastoid air cells are clear. No intraorbital lesions are evident. There is rightward deviation of the nasal septum. IMPRESSION: Atrophy with periventricular small vessel disease. 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 No hemorrhage or mass effect. Electronically Signed   By: Lowella Grip III M.D.   On: 01/31/2016 17:42   Dg Knee Complete 4 Views Left  01/31/2016  CLINICAL DATA:  Status post fall yesterday with persistent lateral knee and ankle  pain EXAM: LEFT KNEE - COMPLETE 4+ VIEW COMPARISON:  None in PACs FINDINGS: The bones appear mildly osteopenic. There is mild narrowing of the medial joint space. There is chondrocalcinosis of both menisci. The tibial plateaus are intact. There is a small spur arising from the articular margin of the medial tibial plateau. There is no acute fracture nor dislocation. A small suprapatellar effusion is suspected. IMPRESSION: There are mild to moderate degenerative changes of the left knee. There is a small suprapatellar effusion. There is no acute fracture nor dislocation. Electronically Signed   By: David  Martinique M.D.   On: 01/31/2016 16:05   I have personally reviewed and evaluated these images and lab results as part of my medical decision-making.   EKG Interpretation None      MDM   Final diagnoses:  Fall, initial encounter  Avulsion fracture of ankle, left, closed, initial encounter   65 year old female with a history of multiple sclerosis, CHF, mitral valve replacement, COPD, process mitral tribulation on Coumadin presents with concern for fall yesterday with head trauma and injury to the left ankle. Left ankle is neurovascularly intact.  XR shows avulsion fx to lateral malleolus. Patient placed in post-op boot and made weight bearing as tolerated. Recommend follow up with orthopedic physician. Recommended rest, ice, elevation. Knee XR WNL.  Head CT without signs of bleed. Patient discharged in stable condition with understanding of reasons to return. Declines pain meds at this time.  Gareth Morgan, MD 02/01/16 1421

## 2016-01-31 NOTE — ED Notes (Signed)
Pt walks with a walker at home and will continue to do so while wearing the cam walker boot.

## 2016-01-31 NOTE — ED Notes (Signed)
Patient transported to CT 

## 2016-01-31 NOTE — ED Notes (Signed)
Patient transported to X-ray 

## 2016-01-31 NOTE — ED Notes (Signed)
Pt made aware to return if symptoms worsen or if any life threatening symptoms occur.   

## 2016-01-31 NOTE — ED Notes (Signed)
Tripped getting put of car yesterday-pain to left ankle, knee and left parietal-presents to triage in w/c but does ambulate w/o assist

## 2016-02-03 ENCOUNTER — Encounter (HOSPITAL_COMMUNITY): Payer: Self-pay

## 2016-02-05 ENCOUNTER — Encounter (HOSPITAL_COMMUNITY): Payer: Self-pay

## 2016-02-07 ENCOUNTER — Encounter (HOSPITAL_COMMUNITY): Payer: Self-pay

## 2016-02-07 DIAGNOSIS — S82892A Other fracture of left lower leg, initial encounter for closed fracture: Secondary | ICD-10-CM | POA: Diagnosis not present

## 2016-02-07 DIAGNOSIS — M25562 Pain in left knee: Secondary | ICD-10-CM | POA: Diagnosis not present

## 2016-02-10 ENCOUNTER — Encounter (HOSPITAL_COMMUNITY): Payer: Self-pay

## 2016-02-12 ENCOUNTER — Encounter (HOSPITAL_COMMUNITY): Payer: Self-pay

## 2016-02-14 ENCOUNTER — Encounter (HOSPITAL_COMMUNITY): Payer: Self-pay

## 2016-02-17 ENCOUNTER — Encounter (HOSPITAL_COMMUNITY): Payer: Self-pay

## 2016-02-19 ENCOUNTER — Encounter (HOSPITAL_COMMUNITY): Payer: Self-pay

## 2016-02-21 ENCOUNTER — Encounter (HOSPITAL_COMMUNITY): Payer: Self-pay

## 2016-02-21 DIAGNOSIS — S82892D Other fracture of left lower leg, subsequent encounter for closed fracture with routine healing: Secondary | ICD-10-CM | POA: Diagnosis not present

## 2016-02-24 ENCOUNTER — Encounter (HOSPITAL_COMMUNITY): Payer: Self-pay

## 2016-02-26 ENCOUNTER — Encounter (HOSPITAL_COMMUNITY): Payer: Self-pay

## 2016-02-28 ENCOUNTER — Encounter (HOSPITAL_COMMUNITY): Payer: Self-pay

## 2016-03-02 ENCOUNTER — Encounter (HOSPITAL_COMMUNITY): Payer: Self-pay

## 2016-03-04 ENCOUNTER — Encounter (HOSPITAL_COMMUNITY): Payer: Self-pay

## 2016-03-06 ENCOUNTER — Encounter (HOSPITAL_COMMUNITY): Payer: Self-pay

## 2016-03-09 ENCOUNTER — Ambulatory Visit (INDEPENDENT_AMBULATORY_CARE_PROVIDER_SITE_OTHER): Payer: Medicare Other | Admitting: *Deleted

## 2016-03-09 ENCOUNTER — Encounter (HOSPITAL_COMMUNITY): Payer: Self-pay

## 2016-03-09 DIAGNOSIS — I4891 Unspecified atrial fibrillation: Secondary | ICD-10-CM | POA: Diagnosis not present

## 2016-03-09 DIAGNOSIS — Z7901 Long term (current) use of anticoagulants: Secondary | ICD-10-CM | POA: Diagnosis not present

## 2016-03-09 DIAGNOSIS — Z5181 Encounter for therapeutic drug level monitoring: Secondary | ICD-10-CM

## 2016-03-09 DIAGNOSIS — I509 Heart failure, unspecified: Secondary | ICD-10-CM | POA: Diagnosis not present

## 2016-03-09 DIAGNOSIS — Z952 Presence of prosthetic heart valve: Secondary | ICD-10-CM

## 2016-03-09 DIAGNOSIS — Z9889 Other specified postprocedural states: Secondary | ICD-10-CM

## 2016-03-09 DIAGNOSIS — Z954 Presence of other heart-valve replacement: Secondary | ICD-10-CM

## 2016-03-09 DIAGNOSIS — Z8679 Personal history of other diseases of the circulatory system: Secondary | ICD-10-CM

## 2016-03-09 LAB — POCT INR: INR: 3.7

## 2016-03-11 ENCOUNTER — Encounter (HOSPITAL_COMMUNITY): Payer: Self-pay

## 2016-03-13 ENCOUNTER — Encounter (HOSPITAL_COMMUNITY): Payer: Self-pay

## 2016-03-13 DIAGNOSIS — M25562 Pain in left knee: Secondary | ICD-10-CM | POA: Diagnosis not present

## 2016-03-13 DIAGNOSIS — M25572 Pain in left ankle and joints of left foot: Secondary | ICD-10-CM | POA: Diagnosis not present

## 2016-03-13 DIAGNOSIS — S82892D Other fracture of left lower leg, subsequent encounter for closed fracture with routine healing: Secondary | ICD-10-CM | POA: Diagnosis not present

## 2016-03-18 ENCOUNTER — Encounter (HOSPITAL_COMMUNITY): Payer: Self-pay

## 2016-03-25 ENCOUNTER — Encounter (HOSPITAL_COMMUNITY): Payer: Self-pay | Admitting: *Deleted

## 2016-04-06 ENCOUNTER — Ambulatory Visit (INDEPENDENT_AMBULATORY_CARE_PROVIDER_SITE_OTHER): Payer: Medicare Other | Admitting: *Deleted

## 2016-04-06 DIAGNOSIS — Z5181 Encounter for therapeutic drug level monitoring: Secondary | ICD-10-CM | POA: Diagnosis not present

## 2016-04-06 DIAGNOSIS — I509 Heart failure, unspecified: Secondary | ICD-10-CM | POA: Diagnosis not present

## 2016-04-06 DIAGNOSIS — Z7901 Long term (current) use of anticoagulants: Secondary | ICD-10-CM | POA: Diagnosis not present

## 2016-04-06 DIAGNOSIS — I4891 Unspecified atrial fibrillation: Secondary | ICD-10-CM | POA: Diagnosis not present

## 2016-04-06 DIAGNOSIS — Z952 Presence of prosthetic heart valve: Secondary | ICD-10-CM

## 2016-04-06 DIAGNOSIS — Z9889 Other specified postprocedural states: Secondary | ICD-10-CM

## 2016-04-06 DIAGNOSIS — Z8679 Personal history of other diseases of the circulatory system: Secondary | ICD-10-CM

## 2016-04-06 DIAGNOSIS — Z954 Presence of other heart-valve replacement: Secondary | ICD-10-CM

## 2016-04-06 LAB — POCT INR: INR: 2.8

## 2016-04-07 ENCOUNTER — Telehealth: Payer: Self-pay | Admitting: Cardiology

## 2016-04-07 NOTE — Telephone Encounter (Signed)
Notified cardiac rehab that Dr Meda Coffee filled out and completed this pts cardiac rehab referral papers, and this will be faxed to them today. Informed cardiac rehab that this wasn't filled out until today, for this is Dr Francesca Oman first day back in the office in a week. Rehab verbalized understanding and gracious for all the assistance provided.

## 2016-04-07 NOTE — Telephone Encounter (Signed)
New Message:  [name] [name] called in stating that the patient is wanting to re-enroll into Cardiac Rehab. She says that she faxed over a referral and has not received a response yet. She wanted to know if maybe she needed to re-fax it. Please f/u with her

## 2016-04-08 DIAGNOSIS — R7989 Other specified abnormal findings of blood chemistry: Secondary | ICD-10-CM | POA: Diagnosis not present

## 2016-04-08 DIAGNOSIS — E559 Vitamin D deficiency, unspecified: Secondary | ICD-10-CM | POA: Diagnosis not present

## 2016-04-08 DIAGNOSIS — E039 Hypothyroidism, unspecified: Secondary | ICD-10-CM | POA: Diagnosis not present

## 2016-04-08 DIAGNOSIS — G35 Multiple sclerosis: Secondary | ICD-10-CM | POA: Diagnosis not present

## 2016-04-10 ENCOUNTER — Encounter (HOSPITAL_COMMUNITY)
Admission: RE | Admit: 2016-04-10 | Discharge: 2016-04-10 | Disposition: A | Discharge status: Home / Self Care | Payer: Self-pay | Source: Ambulatory Visit | Attending: Cardiology | Admitting: Cardiology

## 2016-04-10 DIAGNOSIS — Z952 Presence of prosthetic heart valve: Secondary | ICD-10-CM | POA: Insufficient documentation

## 2016-04-13 ENCOUNTER — Encounter (HOSPITAL_COMMUNITY)
Admission: RE | Admit: 2016-04-13 | Discharge: 2016-04-13 | Disposition: A | Discharge status: Home / Self Care | Payer: Self-pay | Source: Ambulatory Visit | Attending: Cardiology | Admitting: Cardiology

## 2016-04-15 DIAGNOSIS — G35 Multiple sclerosis: Secondary | ICD-10-CM | POA: Diagnosis not present

## 2016-04-16 DIAGNOSIS — R350 Frequency of micturition: Secondary | ICD-10-CM | POA: Diagnosis not present

## 2016-04-17 ENCOUNTER — Encounter (HOSPITAL_COMMUNITY)
Admission: RE | Admit: 2016-04-17 | Discharge: 2016-04-17 | Disposition: A | Discharge status: Home / Self Care | Payer: Self-pay | Source: Ambulatory Visit | Attending: Cardiology | Admitting: Cardiology

## 2016-04-20 ENCOUNTER — Other Ambulatory Visit: Payer: Self-pay

## 2016-04-20 ENCOUNTER — Encounter (HOSPITAL_COMMUNITY)
Admission: RE | Admit: 2016-04-20 | Discharge: 2016-04-20 | Disposition: A | Discharge status: Home / Self Care | Payer: Self-pay | Source: Ambulatory Visit | Attending: Cardiology | Admitting: Cardiology

## 2016-04-20 DIAGNOSIS — Z952 Presence of prosthetic heart valve: Secondary | ICD-10-CM | POA: Insufficient documentation

## 2016-04-20 MED ORDER — METOPROLOL TARTRATE 25 MG PO TABS: 25.0000 mg | ORAL_TABLET | Freq: Two times a day (BID) | ORAL | Status: DC

## 2016-04-24 ENCOUNTER — Encounter (HOSPITAL_COMMUNITY)
Admission: RE | Admit: 2016-04-24 | Discharge: 2016-04-24 | Disposition: A | Discharge status: Home / Self Care | Payer: Self-pay | Source: Ambulatory Visit | Attending: Cardiology | Admitting: Cardiology

## 2016-04-27 ENCOUNTER — Encounter (HOSPITAL_COMMUNITY)
Admission: RE | Admit: 2016-04-27 | Discharge: 2016-04-27 | Disposition: A | Discharge status: Home / Self Care | Payer: Self-pay | Source: Ambulatory Visit | Attending: Cardiology | Admitting: Cardiology

## 2016-05-01 ENCOUNTER — Encounter (HOSPITAL_COMMUNITY): Payer: Self-pay

## 2016-05-04 ENCOUNTER — Encounter (HOSPITAL_COMMUNITY)
Admission: RE | Admit: 2016-05-04 | Discharge: 2016-05-04 | Disposition: A | Discharge status: Home / Self Care | Payer: Self-pay | Source: Ambulatory Visit | Attending: Cardiology | Admitting: Cardiology

## 2016-05-04 ENCOUNTER — Ambulatory Visit (INDEPENDENT_AMBULATORY_CARE_PROVIDER_SITE_OTHER): Payer: Medicare Other | Admitting: *Deleted

## 2016-05-04 DIAGNOSIS — Z7901 Long term (current) use of anticoagulants: Secondary | ICD-10-CM | POA: Diagnosis not present

## 2016-05-04 DIAGNOSIS — I4891 Unspecified atrial fibrillation: Secondary | ICD-10-CM

## 2016-05-04 DIAGNOSIS — Z5181 Encounter for therapeutic drug level monitoring: Secondary | ICD-10-CM | POA: Diagnosis not present

## 2016-05-04 DIAGNOSIS — Z952 Presence of prosthetic heart valve: Secondary | ICD-10-CM

## 2016-05-04 DIAGNOSIS — Z954 Presence of other heart-valve replacement: Secondary | ICD-10-CM

## 2016-05-04 DIAGNOSIS — I509 Heart failure, unspecified: Secondary | ICD-10-CM | POA: Diagnosis not present

## 2016-05-04 DIAGNOSIS — Z9889 Other specified postprocedural states: Secondary | ICD-10-CM

## 2016-05-04 DIAGNOSIS — Z8679 Personal history of other diseases of the circulatory system: Secondary | ICD-10-CM

## 2016-05-04 LAB — POCT INR: INR: 2.9

## 2016-05-08 ENCOUNTER — Encounter (HOSPITAL_COMMUNITY): Payer: Self-pay

## 2016-05-11 ENCOUNTER — Encounter (HOSPITAL_COMMUNITY): Payer: Self-pay

## 2016-05-14 DIAGNOSIS — N3946 Mixed incontinence: Secondary | ICD-10-CM | POA: Diagnosis not present

## 2016-05-14 DIAGNOSIS — R350 Frequency of micturition: Secondary | ICD-10-CM | POA: Diagnosis not present

## 2016-05-15 ENCOUNTER — Encounter (HOSPITAL_COMMUNITY)
Admission: RE | Admit: 2016-05-15 | Discharge: 2016-05-15 | Disposition: A | Discharge status: Home / Self Care | Payer: Self-pay | Source: Ambulatory Visit | Attending: Cardiology | Admitting: Cardiology

## 2016-05-18 ENCOUNTER — Encounter (HOSPITAL_COMMUNITY)
Admission: RE | Admit: 2016-05-18 | Discharge: 2016-05-18 | Disposition: A | Discharge status: Home / Self Care | Payer: Self-pay | Source: Ambulatory Visit | Attending: Cardiology | Admitting: Cardiology

## 2016-05-22 ENCOUNTER — Encounter (HOSPITAL_COMMUNITY): Payer: Self-pay

## 2016-05-22 DIAGNOSIS — Z952 Presence of prosthetic heart valve: Secondary | ICD-10-CM | POA: Insufficient documentation

## 2016-05-25 ENCOUNTER — Encounter (HOSPITAL_COMMUNITY): Payer: Self-pay

## 2016-05-25 ENCOUNTER — Telehealth: Payer: Self-pay | Admitting: Cardiology

## 2016-05-25 NOTE — Telephone Encounter (Signed)
New message      Pt is thinking on having a procedure on her knee joints.  It is a new procedure and she want to "pick your brain" about it.  She is not asking for clearance just the nurses opinion on this new procedure

## 2016-05-25 NOTE — Telephone Encounter (Signed)
If this doesn't require stopping her coumadin I would think that it is ok to do it.   Vena Rua what do you think?

## 2016-05-25 NOTE — Telephone Encounter (Signed)
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  Pt states that its through an outside company, and her Orthopedic MD is not aware that she has enrolled herself into this new study.  Pt states that what they do is inject stem cells into her knee joints, and then this makes new collagen in her knees.  Pt states that the MD running this study informed her that she would have no issues with this and her cardiac history of having a mechanical valve.  Pt states that this warranted her as a "red flag," for the MD didn't even ask for clearance from [name] [name].  Pt would like to know [name] [name] opinion on this, before even considering this procedure.  Advised the pt that she should first inform her established Orthopedic MD of this, for they have been treating the pt for her bilateral knee arthritis.  Informed the pt also, that anytime she has any procedure done outside of cardiology, the MD performing this should always notify [name] [name] to clear her prior to any procedure. Informed the pt that I will route this message to [name] [name] to review and advise on, and follow-up with the pt thereafter.  Pt verbalized understanding and agrees with this plan.  Pt gracious for all the assistance provided and wants to do what's safely advised by [name] [name].

## 2016-05-26 NOTE — Telephone Encounter (Signed)
Thank you Jinny Blossom!  Ivy, please let her know!

## 2016-05-26 NOTE — Telephone Encounter (Signed)
Left a message for the pt to call back to go over Dr Meda Coffee and Jinny Blossom PharmD from coumadin recommendations, based on the conversation with the pt yesterday.

## 2016-05-26 NOTE — Telephone Encounter (Signed)
I agree. Since the pt has a mechanical mitral valve, she would be high risk to d/c her Coumadin for injections and ideally needs to remain on Coumadin therapy with no interruption. However, for our Coumadin patients who require steroid knee injections, they usually remain on their Coumadin. Do not think that stem cell injection would be much different from steroid injection in that regard. As long as the study doctors feel that the bleeding risk of knee injections would be minimal, do not see an issue. Also agree that she should tell her orthopedic MD about the study for their opinion.

## 2016-05-26 NOTE — Telephone Encounter (Signed)
Notified the pt that Dr Meda Coffee and Jinny Blossom Pharmacist in coumadin reviewed our conversation, as mentioned in this note. Informed the pt of their recommendations.  Per the pt, she will follow-up with her Orthopedic MD, before considering such treatment.  Pt states she was trying to do more research on this and states that she is unsure if its FDA approved.  Pt states she will not proceed with this, unless her Ortho MD safely advises and Cardiology does as well.  Pt states she will keep [place] informed if she decides to participate in this study and proceed with this treatment. Pt greatly appreciates all parties involved in making sure she's safely taken care of.

## 2016-05-29 ENCOUNTER — Encounter (HOSPITAL_COMMUNITY): Payer: Self-pay

## 2016-06-01 ENCOUNTER — Ambulatory Visit (INDEPENDENT_AMBULATORY_CARE_PROVIDER_SITE_OTHER): Payer: Medicare Other | Admitting: Pharmacist

## 2016-06-01 ENCOUNTER — Encounter (HOSPITAL_COMMUNITY)
Admission: RE | Admit: 2016-06-01 | Discharge: 2016-06-01 | Disposition: A | Discharge status: Home / Self Care | Payer: Self-pay | Source: Ambulatory Visit | Attending: Cardiology | Admitting: Cardiology

## 2016-06-01 DIAGNOSIS — Z9889 Other specified postprocedural states: Secondary | ICD-10-CM

## 2016-06-01 DIAGNOSIS — Z954 Presence of other heart-valve replacement: Secondary | ICD-10-CM | POA: Diagnosis not present

## 2016-06-01 DIAGNOSIS — Z952 Presence of prosthetic heart valve: Secondary | ICD-10-CM

## 2016-06-01 DIAGNOSIS — I4891 Unspecified atrial fibrillation: Secondary | ICD-10-CM

## 2016-06-01 DIAGNOSIS — Z5181 Encounter for therapeutic drug level monitoring: Secondary | ICD-10-CM

## 2016-06-01 DIAGNOSIS — Z7901 Long term (current) use of anticoagulants: Secondary | ICD-10-CM

## 2016-06-01 DIAGNOSIS — Z8679 Personal history of other diseases of the circulatory system: Secondary | ICD-10-CM

## 2016-06-01 LAB — POCT INR: INR: 5

## 2016-06-02 DIAGNOSIS — R350 Frequency of micturition: Secondary | ICD-10-CM | POA: Diagnosis not present

## 2016-06-05 ENCOUNTER — Encounter (HOSPITAL_COMMUNITY): Payer: Self-pay

## 2016-06-08 ENCOUNTER — Encounter (HOSPITAL_COMMUNITY): Payer: Self-pay

## 2016-06-12 ENCOUNTER — Encounter (HOSPITAL_COMMUNITY)
Admission: RE | Admit: 2016-06-12 | Discharge: 2016-06-12 | Disposition: A | Discharge status: Home / Self Care | Payer: Self-pay | Source: Ambulatory Visit | Attending: Cardiology | Admitting: Cardiology

## 2016-06-15 ENCOUNTER — Telehealth: Payer: Self-pay | Admitting: Physician Assistant

## 2016-06-15 ENCOUNTER — Encounter (HOSPITAL_COMMUNITY): Payer: Self-pay

## 2016-06-15 ENCOUNTER — Ambulatory Visit (INDEPENDENT_AMBULATORY_CARE_PROVIDER_SITE_OTHER): Payer: Medicare Other | Admitting: *Deleted

## 2016-06-15 DIAGNOSIS — Z5181 Encounter for therapeutic drug level monitoring: Secondary | ICD-10-CM | POA: Diagnosis not present

## 2016-06-15 DIAGNOSIS — Z9889 Other specified postprocedural states: Secondary | ICD-10-CM | POA: Diagnosis not present

## 2016-06-15 DIAGNOSIS — Z954 Presence of other heart-valve replacement: Secondary | ICD-10-CM

## 2016-06-15 DIAGNOSIS — Z8679 Personal history of other diseases of the circulatory system: Secondary | ICD-10-CM | POA: Diagnosis not present

## 2016-06-15 DIAGNOSIS — Z952 Presence of prosthetic heart valve: Secondary | ICD-10-CM

## 2016-06-15 LAB — PROTIME-INR
INR: 4.19
Prothrombin Time: 42.8 seconds — ABNORMAL HIGH (ref 11.4–15.2)

## 2016-06-15 LAB — POCT INR: INR: 6.1

## 2016-06-15 NOTE — Telephone Encounter (Signed)
Page by laboratory regarding critical INR of 4.19, review of the record shows patient has mechanical mitral valve, her therapeutic range should be between 2.5-3.5. She visited her Coumadin clinic earlier today, her INR earlier was 6.1. Therefore current INR is coming down. I have instructed her to hold tonight's dose of Coumadin. Coumadin clinic is going to contact her tomorrow for further adjustment of medication.  Hilbert Corrigan PA Pager: [phone]

## 2016-06-17 ENCOUNTER — Encounter (HOSPITAL_BASED_OUTPATIENT_CLINIC_OR_DEPARTMENT_OTHER): Payer: Self-pay | Admitting: Emergency Medicine

## 2016-06-17 ENCOUNTER — Emergency Department (HOSPITAL_BASED_OUTPATIENT_CLINIC_OR_DEPARTMENT_OTHER)
Admission: EM | Admit: 2016-06-17 | Discharge: 2016-06-17 | Disposition: A | Discharge status: Home / Self Care | Payer: Medicare Other | Attending: Emergency Medicine | Admitting: Emergency Medicine

## 2016-06-17 ENCOUNTER — Emergency Department (HOSPITAL_BASED_OUTPATIENT_CLINIC_OR_DEPARTMENT_OTHER): Payer: Medicare Other

## 2016-06-17 DIAGNOSIS — Y93F2 Activity, caregiving, lifting: Secondary | ICD-10-CM | POA: Insufficient documentation

## 2016-06-17 DIAGNOSIS — I110 Hypertensive heart disease with heart failure: Secondary | ICD-10-CM | POA: Insufficient documentation

## 2016-06-17 DIAGNOSIS — J449 Chronic obstructive pulmonary disease, unspecified: Secondary | ICD-10-CM | POA: Insufficient documentation

## 2016-06-17 DIAGNOSIS — E039 Hypothyroidism, unspecified: Secondary | ICD-10-CM | POA: Insufficient documentation

## 2016-06-17 DIAGNOSIS — Z853 Personal history of malignant neoplasm of breast: Secondary | ICD-10-CM | POA: Insufficient documentation

## 2016-06-17 DIAGNOSIS — S32050A Wedge compression fracture of fifth lumbar vertebra, initial encounter for closed fracture: Secondary | ICD-10-CM | POA: Diagnosis not present

## 2016-06-17 DIAGNOSIS — I509 Heart failure, unspecified: Secondary | ICD-10-CM | POA: Diagnosis not present

## 2016-06-17 DIAGNOSIS — X500XXA Overexertion from strenuous movement or load, initial encounter: Secondary | ICD-10-CM | POA: Insufficient documentation

## 2016-06-17 DIAGNOSIS — Y999 Unspecified external cause status: Secondary | ICD-10-CM | POA: Diagnosis not present

## 2016-06-17 DIAGNOSIS — Y929 Unspecified place or not applicable: Secondary | ICD-10-CM | POA: Insufficient documentation

## 2016-06-17 DIAGNOSIS — M545 Low back pain: Secondary | ICD-10-CM | POA: Diagnosis not present

## 2016-06-17 DIAGNOSIS — S3992XA Unspecified injury of lower back, initial encounter: Secondary | ICD-10-CM | POA: Diagnosis not present

## 2016-06-17 DIAGNOSIS — S32000A Wedge compression fracture of unspecified lumbar vertebra, initial encounter for closed fracture: Secondary | ICD-10-CM

## 2016-06-17 MED ORDER — TRAMADOL HCL 50 MG PO TABS
50.0000 mg | ORAL_TABLET | Freq: Once | ORAL | Status: AC
  Administered 2016-06-17: 50 mg via ORAL
  Filled 2016-06-17: qty 1

## 2016-06-17 MED ORDER — TRAMADOL HCL 50 MG PO TABS: 50.0000 mg | ORAL_TABLET | Freq: Four times a day (QID) | ORAL | 0 refills | Status: DC | PRN

## 2016-06-17 MED FILL — traMADol HCL 50 MG TABS: 50 | 5 days supply | Qty: 20 | Fill #0

## 2016-06-17 NOTE — ED Notes (Signed)
Patient transported to X-ray 

## 2016-06-17 NOTE — ED Notes (Signed)
MD at bedside. 

## 2016-06-17 NOTE — ED Notes (Signed)
Pt undressed and prefers to stay in w/c for comfort

## 2016-06-17 NOTE — Discharge Instructions (Signed)
It was our pleasure to provide your ER care today - we hope that you feel better.  Rest. Avoid bending at waist, or lifting > 10 lbs for the next week.  You may take tylenol as need for pain.  You may use your lidocaine patches as need.   You may also take ultram as need for pain - no driving when taking.  Follow up with primary care doctor in the next couple weeks.  Return to ER if worse, new symptoms, intractable pain, numbness/weakness, other concern.

## 2016-06-17 NOTE — ED Triage Notes (Signed)
Pt in c/o lower back pain/spasms after picking up her cat and putting it on her high bed. States she wants to make sure it's just muscular. Pt alert, interactive, in NAD.

## 2016-06-17 NOTE — ED Notes (Signed)
Documented departure condition in 1400. Should have been, in the 1458

## 2016-06-17 NOTE — ED Provider Notes (Signed)
[facility] DEPT [facility] Provider Note   CSN: [id number] Arrival date & time: 06/17/16  1304     History   Chief Complaint Chief Complaint  Patient presents with  . Back Pain    HPI [name] is a 65 y.o. female.  Patient indicates 3 days ago bent to lift up her 16 lb cat, and then noted mod-severe pain in her lower back. Pain has been constant/persistent since. Worse w movements, bending. No radicular pain. No numbness/weakness. No hx chronic back pain. Hx 'osteopenia'. No hx compression fx. Is on coumadin for hx mitral valve surgery - denies any recent abnormal bruising or bleeding. No abd or flank pain.      The history is provided by the patient.  Back Pain   Pertinent negatives include no chest pain, no fever, no numbness, no abdominal pain and no weakness.    Past Medical History:  Diagnosis Date  . Anxiety   . Arthritis    knees  . Breast cancer ([place])   . CHF (congestive heart failure) ([place])    Related to severe mitral regurgitation, April, 2013  . 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 Assess by pulmonary team in the hospital April, 2013  . Ejection fraction    EF 60%, echo, April, 2013, with severe MR before mitral valve replacement  . Hypothyroidism   . Mitral valve regurgitation    Mitral valve replacement April, 2013, Mitral valve prolapse  . Multiple sclerosis ([place])   . Neurogenic bladder   . Neuromuscular disorder (HCC)    ms  . Osteopenia   . Ovarian cyst   . Paroxysmal atrial fibrillation (HCC)    Rapid atrial fibrillation in-hospital, Rapid cardioversion,  before mitral valve surgery  . Pulmonary hypertension ([place])    Echo, April, 2013, before mitral valve surgery  . S/P Maze operation for atrial fibrillation 01/26/2012   Complete biatrial lesion set using cryothermy via right mini thoracotomy  . 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 Shortness of breath    Related to severe mitral regurgitation, April, 2013  . Warfarin anticoagulation    Mechanical mitral prosthesis, April, 20136    Patient Active Problem List   Diagnosis Date Noted  . Encounter for therapeutic drug monitoring 11/27/2013  . Palpitations 09/20/2013  . Mitral valve regurgitation   . Breast cancer ([place])   . Hypothyroidism   . CHF (congestive heart failure) ([place])   . Shortness of breath   . Paroxysmal atrial fibrillation (HCC)   . Neuromuscular disorder ([place])   . Arthritis   . COPD (chronic obstructive pulmonary disease) ([place])   . Neurogenic bladder   . Ejection fraction   . Warfarin anticoagulation   . First degree heart block 01/29/2012  . 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 S/P Maze operation for atrial fibrillation 01/26/2012  . COPD with emphysema ([place]) 01/22/2012  . Hypokalemia 01/19/2012  . Anxiety 01/18/2012  . Tachycardia 01/18/2012  . Pulmonary hypertension ([place]) 01/18/2012  . Hypoxemia 01/18/2012  . Multiple sclerosis ([place]) 01/19/2007  . 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 OSTEOPENIA 01/19/2007  . URINARY INCONTINENCE 01/19/2007  . COLONOSCOPY, HX OF 01/19/2007  . GENITAL HERPES, HX OF 01/19/2007  . BREAST CANCER, HX OF 06/19/1998    Past Surgical History:  Procedure Laterality Date  . BREAST LUMPECTOMY    . CHEST TUBE INSERTION  01/26/2012   Procedure: CHEST TUBE  INSERTION;  Surgeon: Rexene Alberts, MD;  Location: [facility];  Service: Open Heart Surgery;  Laterality: Left;  . CYSTOSCOPY    . HERNIA REPAIR    . LAPAROSCOPY    . LEFT AND RIGHT HEART CATHETERIZATION WITH CORONARY ANGIOGRAM N/A 01/20/2012   Procedure: LEFT AND RIGHT HEART CATHETERIZATION WITH CORONARY ANGIOGRAM;  Surgeon: Burnell Blanks, MD;  Location: [facility] CATH LAB;  Service: Cardiovascular;  Laterality: N/A;  . LYMPHADENECTOMY    . MAZE  01/26/2012   Procedure: MAZE;  Surgeon: Rexene Alberts, MD;  Location: [facility];  Service: Open Heart Surgery;   Laterality: N/A;  . MITRAL VALVE REPLACEMENT  01/26/2012   Procedure: MINIMALLY INVASIVE MITRAL VALVE (MV) REPLACEMENT;  Surgeon: Rexene Alberts, MD;  Location: [facility];  Service: Open Heart Surgery;  Laterality: Right;  . TEE WITHOUT CARDIOVERSION  01/19/2012   Procedure: TRANSESOPHAGEAL ECHOCARDIOGRAM (TEE);  Surgeon: Peter M Martinique, MD;  Location: [facility] ENDOSCOPY;  Service: Cardiovascular;  Laterality: N/A;  . TONSILLECTOMY    . WRIST SURGERY      OB History    No data available       Home Medications    Prior to Admission medications   Medication Sig Start Date End Date Taking? Authorizing Provider  Black Cohosh 40 MG CAPS Take 1 capsule by mouth daily.    Historical Provider, MD  Bromelains (BROMELAIN PO) Take 1 capsule by mouth 2 (two) times daily.    Historical Provider, MD  Cholecalciferol (VITAMIN D) 2000 UNITS CAPS Take 6,000 Units by mouth daily.    Historical Provider, MD  Coenzyme Q10 (CO Q-10) 100 MG CAPS Take 1 capsule by mouth daily.     Historical Provider, MD  Cranberry 500 MG CAPS Take 1 capsule by mouth daily.    Historical Provider, MD  L-THEANINE PO Take 1 capsule by mouth daily.     Historical Provider, MD  lactose free nutrition (BOOST PLUS) LIQD Take 237 mLs by mouth daily.     Historical Provider, MD  lidocaine-prilocaine (EMLA) cream Apply 1 application topically as needed (per pt this is for use before blood draws).  03/06/15   Historical Provider, MD  Lysine 500 MG CAPS Take 1 capsule by mouth daily.    Historical Provider, MD  metoprolol tartrate (LOPRESSOR) 25 MG tablet Take 1 tablet (25 mg total) by mouth 2 (two) times daily. 04/20/16   [name], MD  MILK THISTLE PO Take 1 capsule by mouth 2 (two) times daily.    Historical Provider, MD  Misc Natural Products (GLUCOSAMINE CHOND COMPLEX/MSM PO) Strength of dose Glucosamine 1500mg  /Chodroitron 1000mg / MSM 500mg  takes one twice daily    Historical Provider, MD  Olive Leaf 500 MG CAPS Take 1 capsule by mouth 2  (two) times daily.     Historical Provider, MD  OVER THE COUNTER MEDICATION Red marine algae - 1 capsules daily    Historical Provider, MD  Petasin (PETADOLEX PO) Take by mouth 2 (two) times daily.    Historical Provider, MD  Probiotic Product (PROBIOTIC DAILY PO) Take 1 capsule once a day    Historical Provider, MD  PROGESTERONE MICRONIZED PO Take 75 mg by mouth daily.     Historical Provider, MD  thyroid (ARMOUR) 60 MG tablet Take 60 mg by mouth daily.     Historical Provider, MD  TURMERIC PO Take by mouth.    Historical Provider, MD  vitamin A 10000 UNIT capsule Take 10,000 Units by mouth daily.  Historical Provider, MD  vitamin E 400 UNIT capsule Take 400 Units by mouth daily.    Historical Provider, MD  warfarin (COUMADIN) 5 MG tablet Take as directed by Coumadin clinic 11/04/15   Dorothy Spark, MD    Family History Family History  Problem Relation Age of Onset  . Heart disease Father     cardiac arrest   . CAD Mother     5 stents and numerous bypass surgery  . CAD      Social History Social History  Substance Use Topics  . Smoking status: Never Smoker  . Smokeless tobacco: Never Used  . Alcohol use No     Allergies   Erythromycin   Review of Systems Review of Systems  Constitutional: Negative for fever.  Respiratory: Negative for shortness of breath.   Cardiovascular: Negative for chest pain.  Gastrointestinal: Negative for abdominal pain and vomiting.  Genitourinary: Negative for flank pain.  Musculoskeletal: Positive for back pain. Negative for neck pain.  Skin: Negative for rash.  Neurological: Negative for weakness and numbness.  Hematological:       On coumadin, no recent abnormal bruising or bleeding.      Physical Exam Updated Vital Signs BP 131/87 (BP Location: Left Arm)   Pulse 94   Temp 98.1 F (36.7 C) (Oral)   Resp 20   Ht 5\' 9"  (1.753 m)   Wt 64.9 kg   SpO2 100%   BMI 21.12 kg/m   Physical Exam  Constitutional: She appears  well-developed and well-nourished. No distress.  Eyes: Conjunctivae are normal. No scleral icterus.  Neck: Neck supple. No tracheal deviation present.  Cardiovascular: Intact distal pulses.   Pulmonary/Chest: Effort normal. No respiratory distress.  Abdominal: Soft. Normal appearance. She exhibits no distension and no mass. There is no tenderness.  Musculoskeletal: She exhibits no edema.  Lumbar tenderness diffusely, otherwise TLS spine non tender, aligned.   Neurological: She is alert.  Motor intact bil lower ext, stre 5/5. sens grossly intact.   Skin: Skin is warm and dry. No rash noted. She is not diaphoretic.  Psychiatric: She has a normal mood and affect.  Nursing note and vitals reviewed.    ED Treatments / Results  Labs (all labs ordered are listed, but only abnormal results are displayed) Labs Reviewed - No data to display  EKG  EKG Interpretation None       Radiology Dg Lumbar Spine Complete  Result Date: 06/17/2016 CLINICAL DATA:  Low back pain after lifting yesterday. EXAM: LUMBAR SPINE - COMPLETE 4+ VIEW COMPARISON:  CT of 01/21/2012 FINDINGS: Five lumbar type vertebral bodies. Minimal convex left lumbar spine curvature. Mild osteopenia. Possible mild L5 vertebral body height loss. Facet arthropathy involves L4-5 and L5-S1. Aortic atherosclerosis. IMPRESSION: Cannot exclude mild L5 vertebral body height loss. Evaluation degraded by spinal curvature and mild osteopenia. Expected for age spondylosis without other acute osseous finding. Aortic atherosclerosis. Electronically Signed   By: Abigail Miyamoto M.D.   On: 06/17/2016 14:27    Procedures Procedures (including critical care time)  Medications Ordered in ED Medications - No data to display   Initial Impression / Assessment and Plan / ED Course  I have reviewed the triage vital signs and the nursing notes.  Pertinent labs & imaging results that were available during my care of the patient were reviewed by me and  considered in my medical decision making (see chart for details).  Clinical Course    Xrays.   Pt indicates she  only takes tylenol for pain, and she has already taken that.   Discussed xrays w pt, suspect mild compression fx L5.  Patient now indicates would like stronger med for pain. Ultram po. rx for home.  Patient currently appears stable for d/c.       Final Clinical Impressions(s) / ED Diagnoses   Final diagnoses:  None    New Prescriptions New Prescriptions   No medications on file     Lajean Saver, MD 06/17/16 1450

## 2016-06-19 ENCOUNTER — Encounter (HOSPITAL_COMMUNITY): Payer: Self-pay | Attending: Cardiology

## 2016-06-19 DIAGNOSIS — Z952 Presence of prosthetic heart valve: Secondary | ICD-10-CM | POA: Insufficient documentation

## 2016-06-20 DIAGNOSIS — N951 Menopausal and female climacteric states: Secondary | ICD-10-CM | POA: Diagnosis not present

## 2016-06-25 ENCOUNTER — Other Ambulatory Visit: Payer: Self-pay

## 2016-06-26 ENCOUNTER — Encounter (HOSPITAL_COMMUNITY): Payer: Self-pay

## 2016-06-29 ENCOUNTER — Telehealth: Payer: Self-pay | Admitting: *Deleted

## 2016-06-29 ENCOUNTER — Encounter (HOSPITAL_COMMUNITY): Payer: Self-pay

## 2016-06-29 NOTE — Telephone Encounter (Signed)
Pt called to inform [facility] that she canceled her appt for today.  She stated that she has a lumbar fracture that was diagnosed on 06/17/16.  Advised that her INR was increased on last visit & the importance of follow-up and she stated she understood. Follow-up appt set per pt a week later & she stated she would call with any other changes.

## 2016-07-03 ENCOUNTER — Encounter (HOSPITAL_COMMUNITY): Payer: Self-pay

## 2016-07-06 ENCOUNTER — Encounter (HOSPITAL_COMMUNITY): Payer: Self-pay

## 2016-07-06 ENCOUNTER — Telehealth (HOSPITAL_COMMUNITY): Payer: Self-pay | Admitting: *Deleted

## 2016-07-10 ENCOUNTER — Encounter (HOSPITAL_COMMUNITY): Payer: Self-pay

## 2016-07-12 ENCOUNTER — Emergency Department (HOSPITAL_BASED_OUTPATIENT_CLINIC_OR_DEPARTMENT_OTHER)
Admission: EM | Admit: 2016-07-12 | Discharge: 2016-07-12 | Disposition: A | Discharge status: Home / Self Care | Payer: Medicare Other | Attending: Emergency Medicine | Admitting: Emergency Medicine

## 2016-07-12 ENCOUNTER — Emergency Department (HOSPITAL_BASED_OUTPATIENT_CLINIC_OR_DEPARTMENT_OTHER): Payer: Medicare Other

## 2016-07-12 ENCOUNTER — Encounter (HOSPITAL_BASED_OUTPATIENT_CLINIC_OR_DEPARTMENT_OTHER): Payer: Self-pay | Admitting: Emergency Medicine

## 2016-07-12 DIAGNOSIS — E039 Hypothyroidism, unspecified: Secondary | ICD-10-CM | POA: Insufficient documentation

## 2016-07-12 DIAGNOSIS — Y939 Activity, unspecified: Secondary | ICD-10-CM | POA: Diagnosis not present

## 2016-07-12 DIAGNOSIS — J449 Chronic obstructive pulmonary disease, unspecified: Secondary | ICD-10-CM | POA: Diagnosis not present

## 2016-07-12 DIAGNOSIS — Z853 Personal history of malignant neoplasm of breast: Secondary | ICD-10-CM | POA: Insufficient documentation

## 2016-07-12 DIAGNOSIS — Y999 Unspecified external cause status: Secondary | ICD-10-CM | POA: Diagnosis not present

## 2016-07-12 DIAGNOSIS — M25461 Effusion, right knee: Secondary | ICD-10-CM | POA: Diagnosis not present

## 2016-07-12 DIAGNOSIS — W07XXXA Fall from chair, initial encounter: Secondary | ICD-10-CM | POA: Diagnosis not present

## 2016-07-12 DIAGNOSIS — Y929 Unspecified place or not applicable: Secondary | ICD-10-CM | POA: Diagnosis not present

## 2016-07-12 DIAGNOSIS — S8991XA Unspecified injury of right lower leg, initial encounter: Secondary | ICD-10-CM | POA: Diagnosis not present

## 2016-07-12 DIAGNOSIS — I509 Heart failure, unspecified: Secondary | ICD-10-CM | POA: Diagnosis not present

## 2016-07-12 MED ORDER — TRAMADOL HCL 50 MG PO TABS: 50.0000 mg | ORAL_TABLET | Freq: Four times a day (QID) | ORAL | 0 refills | Status: DC | PRN

## 2016-07-12 NOTE — ED Triage Notes (Signed)
R knee pain after falling 6 days ago. Swelling noted. Taking tylenol with some relief.

## 2016-07-12 NOTE — ED Provider Notes (Signed)
[facility] DEPT M[facility] Provider Note   CSN: [id number] Arrival date & time: 07/12/16  1748 By signing my name below, I, [name] [name], attest that this documentation has been prepared under the direction and in the presence of non-physician practitioner, Arlean Hopping, PA-C  Electronically Signed: Dyke [name], Scribe. 07/12/2016. 7:49 PM.   History   Chief Complaint No chief complaint on file.  HPI [name] [name] is a 65 y.o. femalewith hx of arthritis, MS, and osteopenia who presents to the Emergency Department complaining of sudden onset right knee pain s/p fall 6 days ago. She slid out of her chair and fell, hitting her knee. She describes her pain as aching and mild to moderate. Pt notes associated swelling. She has taken tylenol with some relief. Denies head injury, LOC, acute neck/back pain, neuro deficits, or any other complaints.     The history is provided by the patient. No language interpreter was used.    Past Medical History:  Diagnosis Date  . Anxiety   . Arthritis    knees  . Breast cancer ([place])   . CHF (congestive heart failure) ([place])    Related to severe mitral regurgitation, April, 2013  . 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 Assess by pulmonary team in the hospital April, 2013  . Ejection fraction    EF 60%, echo, April, 2013, with severe MR before mitral valve replacement  . Hypothyroidism   . Mitral valve regurgitation    Mitral valve replacement April, 2013, Mitral valve prolapse  . Multiple sclerosis ([place])   . Neurogenic bladder   . Neuromuscular disorder (HCC)    ms  . Osteopenia   . Ovarian cyst   . Paroxysmal atrial fibrillation (HCC)    Rapid atrial fibrillation in-hospital, Rapid cardioversion,  before mitral valve surgery  . Pulmonary hypertension ([place])    Echo, April, 2013, before mitral valve surgery  . S/P Maze operation for atrial fibrillation 01/26/2012   Complete biatrial lesion set using  cryothermy via right mini thoracotomy  . 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 Shortness of breath    Related to severe mitral regurgitation, April, 2013  . Warfarin anticoagulation    Mechanical mitral prosthesis, April, 20136    Patient Active Problem List   Diagnosis Date Noted  . Encounter for therapeutic drug monitoring 11/27/2013  . Palpitations 09/20/2013  . Mitral valve regurgitation   . Breast cancer ([place])   . Hypothyroidism   . CHF (congestive heart failure) ([place])   . Shortness of breath   . Paroxysmal atrial fibrillation (HCC)   . Neuromuscular disorder ([place])   . Arthritis   . COPD (chronic obstructive pulmonary disease) ([place])   . Neurogenic bladder   . Ejection fraction   . Warfarin anticoagulation   . First degree heart block 01/29/2012  . 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 S/P Maze operation for atrial fibrillation 01/26/2012  . COPD with emphysema ([place]) 01/22/2012  . Hypokalemia 01/19/2012  . Anxiety 01/18/2012  . Tachycardia 01/18/2012  . Pulmonary hypertension ([place]) 01/18/2012  . Hypoxemia 01/18/2012  . Multiple sclerosis ([place]) 01/19/2007  . 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 OSTEOPENIA 01/19/2007  . URINARY INCONTINENCE 01/19/2007  . COLONOSCOPY, HX OF 01/19/2007  . GENITAL HERPES, HX OF 01/19/2007  . BREAST CANCER, HX OF 06/19/1998    Past Surgical History:  Procedure Laterality Date  . BREAST LUMPECTOMY    .  CHEST TUBE INSERTION  01/26/2012   Procedure: CHEST TUBE INSERTION;  Surgeon: Rexene Alberts, MD;  Location: [facility];  Service: Open Heart Surgery;  Laterality: Left;  . CYSTOSCOPY    . HERNIA REPAIR    . LAPAROSCOPY    . LEFT AND RIGHT HEART CATHETERIZATION WITH CORONARY ANGIOGRAM N/A 01/20/2012   Procedure: LEFT AND RIGHT HEART CATHETERIZATION WITH CORONARY ANGIOGRAM;  Surgeon: Burnell Blanks, MD;  Location: [facility] CATH LAB;  Service: Cardiovascular;   Laterality: N/A;  . LYMPHADENECTOMY    . MAZE  01/26/2012   Procedure: MAZE;  Surgeon: Rexene Alberts, MD;  Location: [facility];  Service: Open Heart Surgery;  Laterality: N/A;  . MITRAL VALVE REPLACEMENT  01/26/2012   Procedure: MINIMALLY INVASIVE MITRAL VALVE (MV) REPLACEMENT;  Surgeon: Rexene Alberts, MD;  Location: [facility];  Service: Open Heart Surgery;  Laterality: Right;  . TEE WITHOUT CARDIOVERSION  01/19/2012   Procedure: TRANSESOPHAGEAL ECHOCARDIOGRAM (TEE);  Surgeon: Peter M Martinique, MD;  Location[facility] ENDOSCOPY;  Service: Cardiovascular;  Laterality: N/A;  . TONSILLECTOMY    . WRIST SURGERY      OB History    No data available      Home Medications    Prior to Admission medications   Medication Sig Start Date End Date Taking? Authorizing Provider  Black Cohosh 40 MG CAPS Take 1 capsule by mouth daily.    Historical Provider, MD  Bromelains (BROMELAIN PO) Take 1 capsule by mouth 2 (two) times daily.    Historical Provider, MD  Cholecalciferol (VITAMIN D) 2000 UNITS CAPS Take 6,000 Units by mouth daily.    Historical Provider, MD  Coenzyme Q10 (CO Q-10) 100 MG CAPS Take 1 capsule by mouth daily.     Historical Provider, MD  Cranberry 500 MG CAPS Take 1 capsule by mouth daily.    Historical Provider, MD  L-THEANINE PO Take 1 capsule by mouth daily.     Historical Provider, MD  lactose free nutrition (BOOST PLUS) LIQD Take 237 mLs by mouth daily.     Historical Provider, MD  lidocaine-prilocaine (EMLA) cream Apply 1 application topically as needed (per pt this is for use before blood draws).  03/06/15   Historical Provider, MD  Lysine 500 MG CAPS Take 1 capsule by mouth daily.    Historical Provider, MD  metoprolol tartrate (LOPRESSOR) 25 MG tablet Take 1 tablet (25 mg total) by mouth 2 (two) times daily. 04/20/16   [name], MD  MILK THISTLE PO Take 1 capsule by mouth 2 (two) times daily.    Historical Provider, MD  Misc Natural Products (GLUCOSAMINE CHOND COMPLEX/MSM PO) Strength of  dose Glucosamine 1500mg  /Chodroitron 1000mg / MSM 500mg  takes one twice daily    Historical Provider, MD  Olive Leaf 500 MG CAPS Take 1 capsule by mouth 2 (two) times daily.     Historical Provider, MD  OVER THE COUNTER MEDICATION Red marine algae - 1 capsules daily    Historical Provider, MD  Petasin (PETADOLEX PO) Take by mouth 2 (two) times daily.    Historical Provider, MD  Probiotic Product (PROBIOTIC DAILY PO) Take 1 capsule once a day    Historical Provider, MD  PROGESTERONE MICRONIZED PO Take 75 mg by mouth daily.     Historical Provider, MD  thyroid (ARMOUR) 60 MG tablet Take 60 mg by mouth daily.     Historical Provider, MD  traMADol (ULTRAM) 50 MG tablet Take 1 tablet (50 mg total) by mouth every  6 (six) hours as needed. 06/17/16   Lajean Saver, MD  traMADol (ULTRAM) 50 MG tablet Take 1 tablet (50 mg total) by mouth every 6 (six) hours as needed. 07/12/16   Narcissa Melder C Kristelle Cavallaro, PA-C  TURMERIC PO Take by mouth.    Historical Provider, MD  vitamin A 10000 UNIT capsule Take 10,000 Units by mouth daily.    Historical Provider, MD  vitamin E 400 UNIT capsule Take 400 Units by mouth daily.    Historical Provider, MD  warfarin (COUMADIN) 5 MG tablet Take as directed by Coumadin clinic 11/04/15   Dorothy Spark, MD    Family History Family History  Problem Relation Age of Onset  . Heart disease Father     cardiac arrest   . CAD Mother     5 stents and numerous bypass surgery  . CAD      Social History Social History  Substance Use Topics  . Smoking status: Never Smoker  . Smokeless tobacco: Never Used  . Alcohol use No     Allergies   Erythromycin   Review of Systems Review of Systems  Musculoskeletal: Positive for arthralgias and joint swelling.  Neurological: Negative for weakness and numbness.   Physical Exam Updated Vital Signs BP 115/66 (BP Location: Left Arm)   Pulse 75   Temp 98.2 F (36.8 C) (Oral)   Resp 18   Ht 5\' 9"  (1.753 m)   Wt 142 lb (64.4 kg)   SpO2 99%    BMI 20.97 kg/m   Physical Exam  Constitutional: She is oriented to person, place, and time. She appears well-developed and well-nourished. No distress.  HENT:  Head: Normocephalic and atraumatic.  Eyes: Conjunctivae are normal.  Neck: Neck supple.  Cardiovascular: Normal rate and regular rhythm.   Pulmonary/Chest: Effort normal.  Abdominal: She exhibits no distension.  Musculoskeletal: She exhibits edema and tenderness. She exhibits no deformity.  Swelling to the medial and lateral right knee with small effusion noted.  FROM right lower extremity. No crepitance or laxity.   Neurological: She is alert and oriented to person, place, and time.  No sensory deficits. Strength 5/5  Skin: Skin is warm and dry. She is not diaphoretic.  Psychiatric: She has a normal mood and affect. Her behavior is normal.  Nursing note and vitals reviewed.  ED Treatments / Results  DIAGNOSTIC STUDIES:  Oxygen Saturation is 99% on RA, normal by my interpretation.    COORDINATION OF CARE:  7:49 PM Pt to f/u with orthopedics. Discussed treatment plan with pt at bedside and pt agreed to plan.   Labs (all labs ordered are listed, but only abnormal results are displayed) Labs Reviewed - No data to display  EKG  EKG Interpretation None       Radiology Dg Knee Complete 4 Views Right  Result Date: 07/12/2016 CLINICAL DATA:  Status post slip and fall 6 days ago with continued right knee pain and swelling. Initial encounter. EXAM: RIGHT KNEE - COMPLETE 4+ VIEW COMPARISON:  None. FINDINGS: Small joint effusion is identified. No fracture is seen. Bones appear osteopenic. Chondrocalcinosis is noted. Joint spaces appear preserved. IMPRESSION: Negative for fracture. Small joint effusion. Osteopenia. Chondrocalcinosis. Electronically Signed   By: Inge Rise M.D.   On: 07/12/2016 19:17    Procedures Procedures (including critical care time)  Medications Ordered in ED Medications - No data to  display   Initial Impression / Assessment and Plan / ED Course  I have reviewed the triage vital signs  and the nursing notes.  Pertinent labs & imaging results that were available during my care of the patient were reviewed by me and considered in my medical decision making (see chart for details).  Clinical Course    Patient with right knee injury that occurred 6 days ago.  Findings and plan of care discussed with Davonna Belling, MD.   No fracture or dislocation on x-ray. No instability on exam. Patient to follow-up with orthopedics. Return precautions discussed.  Vitals:   07/12/16 1813 07/12/16 1814 07/12/16 2009  BP: 115/66  143/63  Pulse: 75  70  Resp: 18  18  Temp: 98.2 F (36.8 C)    TempSrc: Oral    SpO2: 99%  99%  Weight:  64.4 kg   Height:  5\' 9"  (1.753 m)      Final Clinical Impressions(s) / ED Diagnoses   Final diagnoses:  Knee injuries, right, initial encounter   New Prescriptions Discharge Medication List as of 07/12/2016  8:01 PM    START taking these medications   Details  !! traMADol (ULTRAM) 50 MG tablet Take 1 tablet (50 mg total) by mouth every 6 (six) hours as needed., Starting Sun 07/12/2016, Print     !! - Potential duplicate medications found. Please discuss with provider.     I personally performed the services described in this documentation, which was scribed in my presence. The recorded information has been reviewed and is accurate.    Lorayne Bender, PA-C 07/13/16 0101    Davonna Belling, MD 07/15/16 [phone]

## 2016-07-12 NOTE — Discharge Instructions (Signed)
Tylenol as needed for pain and inflammation. Tramadol for severe pain. Use caution with the tramadol as this can make you unsteady. Do not drive or perform other dangerous activities while taking this medication. Apply ice to reduce inflammation. Elevate the extremity whenever possible. Weightbearing as tolerated. Follow-up with orthopedics as soon as possible.

## 2016-07-13 ENCOUNTER — Encounter (HOSPITAL_COMMUNITY): Payer: Self-pay

## 2016-07-13 ENCOUNTER — Telehealth: Payer: Self-pay | Admitting: Cardiology

## 2016-07-13 MED FILL — traMADol HCL 50 MG TABS: 50 | 4 days supply | Qty: 15 | Fill #0

## 2016-07-13 NOTE — Telephone Encounter (Signed)
°  New Prob  Pt is currently taking Tramadol and Extra Strength Tylenol for her knee. She feels medications may be interfering with her Coumadin as she has noted some new bruising. Calling to speak to someone regarding this and if she should continue with medications or stop one or the other. Please call.

## 2016-07-14 NOTE — Telephone Encounter (Signed)
Returned call to pt and advised her there are no interactions between tramadol or Tylenol with warfarin. She verbalized understanding.

## 2016-07-16 DIAGNOSIS — M1711 Unilateral primary osteoarthritis, right knee: Secondary | ICD-10-CM | POA: Diagnosis not present

## 2016-07-16 DIAGNOSIS — M11261 Other chondrocalcinosis, right knee: Secondary | ICD-10-CM | POA: Diagnosis not present

## 2016-07-17 ENCOUNTER — Encounter (HOSPITAL_COMMUNITY): Payer: Self-pay

## 2016-07-20 ENCOUNTER — Encounter (HOSPITAL_COMMUNITY): Payer: Self-pay

## 2016-07-24 ENCOUNTER — Encounter (HOSPITAL_COMMUNITY): Payer: Self-pay

## 2016-07-27 ENCOUNTER — Encounter (HOSPITAL_COMMUNITY): Payer: Self-pay

## 2016-07-31 ENCOUNTER — Emergency Department (HOSPITAL_BASED_OUTPATIENT_CLINIC_OR_DEPARTMENT_OTHER)
Admission: EM | Admit: 2016-07-31 | Discharge: 2016-07-31 | Disposition: A | Discharge status: Home / Self Care | Payer: Medicare Other | Attending: Emergency Medicine | Admitting: Emergency Medicine

## 2016-07-31 ENCOUNTER — Encounter (HOSPITAL_COMMUNITY): Payer: Self-pay

## 2016-07-31 ENCOUNTER — Encounter (HOSPITAL_BASED_OUTPATIENT_CLINIC_OR_DEPARTMENT_OTHER): Payer: Self-pay | Admitting: *Deleted

## 2016-07-31 ENCOUNTER — Emergency Department (HOSPITAL_BASED_OUTPATIENT_CLINIC_OR_DEPARTMENT_OTHER): Payer: Medicare Other

## 2016-07-31 DIAGNOSIS — Y939 Activity, unspecified: Secondary | ICD-10-CM | POA: Diagnosis not present

## 2016-07-31 DIAGNOSIS — E039 Hypothyroidism, unspecified: Secondary | ICD-10-CM | POA: Diagnosis not present

## 2016-07-31 DIAGNOSIS — M545 Low back pain: Secondary | ICD-10-CM | POA: Diagnosis not present

## 2016-07-31 DIAGNOSIS — Z791 Long term (current) use of non-steroidal anti-inflammatories (NSAID): Secondary | ICD-10-CM | POA: Insufficient documentation

## 2016-07-31 DIAGNOSIS — S32020A Wedge compression fracture of second lumbar vertebra, initial encounter for closed fracture: Secondary | ICD-10-CM | POA: Diagnosis not present

## 2016-07-31 DIAGNOSIS — W1839XA Other fall on same level, initial encounter: Secondary | ICD-10-CM | POA: Diagnosis not present

## 2016-07-31 DIAGNOSIS — I509 Heart failure, unspecified: Secondary | ICD-10-CM | POA: Diagnosis not present

## 2016-07-31 DIAGNOSIS — S3992XA Unspecified injury of lower back, initial encounter: Secondary | ICD-10-CM | POA: Diagnosis present

## 2016-07-31 DIAGNOSIS — J449 Chronic obstructive pulmonary disease, unspecified: Secondary | ICD-10-CM | POA: Diagnosis not present

## 2016-07-31 DIAGNOSIS — Z853 Personal history of malignant neoplasm of breast: Secondary | ICD-10-CM | POA: Insufficient documentation

## 2016-07-31 DIAGNOSIS — I110 Hypertensive heart disease with heart failure: Secondary | ICD-10-CM | POA: Insufficient documentation

## 2016-07-31 DIAGNOSIS — Z79899 Other long term (current) drug therapy: Secondary | ICD-10-CM | POA: Diagnosis not present

## 2016-07-31 DIAGNOSIS — Y999 Unspecified external cause status: Secondary | ICD-10-CM | POA: Diagnosis not present

## 2016-07-31 DIAGNOSIS — Y929 Unspecified place or not applicable: Secondary | ICD-10-CM | POA: Diagnosis not present

## 2016-07-31 DIAGNOSIS — S32059A Unspecified fracture of fifth lumbar vertebra, initial encounter for closed fracture: Secondary | ICD-10-CM | POA: Diagnosis not present

## 2016-07-31 DIAGNOSIS — S32029A Unspecified fracture of second lumbar vertebra, initial encounter for closed fracture: Secondary | ICD-10-CM | POA: Diagnosis not present

## 2016-07-31 MED ORDER — DIAZEPAM 2 MG PO TABS: 2.0000 mg | ORAL_TABLET | Freq: Four times a day (QID) | ORAL | 0 refills | Status: DC | PRN

## 2016-07-31 MED ORDER — IBUPROFEN 800 MG PO TABS
800.0000 mg | ORAL_TABLET | Freq: Once | ORAL | Status: AC
  Administered 2016-07-31: 800 mg via ORAL
  Filled 2016-07-31: qty 1

## 2016-07-31 MED ORDER — DIAZEPAM 2 MG PO TABS
2.0000 mg | ORAL_TABLET | Freq: Once | ORAL | Status: AC
  Administered 2016-07-31: 2 mg via ORAL
  Filled 2016-07-31: qty 1

## 2016-07-31 MED FILL — diazePAM 2 MG TABS: 2 | 2 days supply | Qty: 5 | Fill #0

## 2016-07-31 NOTE — ED Triage Notes (Signed)
Pt /o fall x 2 weeks ago c/o lower back pain

## 2016-07-31 NOTE — ED Notes (Signed)
MD at bedside. 

## 2016-07-31 NOTE — ED Notes (Signed)
Pt teaching provided on medications that may cause drowsiness. Pt instructed not to drive or operate heavy machinery while taking the prescribed medication. Pt verbalized understanding.   

## 2016-07-31 NOTE — ED Provider Notes (Signed)
[name] Note   CSN: [id number] Arrival date & time: 07/31/16  1525     History   Chief Complaint Chief Complaint  Patient presents with  . Back Pain    HPI [name] [name] is a 65 y.o. female.  65 yo F with a cc of low back pain.  Occurred 2 weeks ago post fall.  The patient states that she fell out of bed. Denies loss of bowel or bladder. She has been taking tramadol for chronic back pain has not improved it. She has been mildly constipated. She denies abdominal pain fever. Denies weakness or numbness to the legs.   The history is provided by the patient.  Back Pain   This is a new problem. The current episode started less than 1 hour ago. The problem occurs constantly. The problem has not changed since onset.The pain is associated with no known injury. The pain is present in the lumbar spine. The pain does not radiate. The pain is at a severity of 7/10. The pain is mild. The pain is worse during the day. Pertinent negatives include no chest pain, no fever, no headaches and no dysuria. She has tried nothing for the symptoms. The treatment provided no relief.    Past Medical History:  Diagnosis Date  . Anxiety   . Arthritis    knees  . Breast cancer ([place])   . CHF (congestive heart failure) ([place])    Related to severe mitral regurgitation, April, 2013  . 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 Assess by pulmonary team in the hospital April, 2013  . Ejection fraction    EF 60%, echo, April, 2013, with severe MR before mitral valve replacement  . Hypothyroidism   . Mitral valve regurgitation    Mitral valve replacement April, 2013, Mitral valve prolapse  . Multiple sclerosis ([place])   . Neurogenic bladder   . Neuromuscular disorder (HCC)    ms  . Osteopenia   . Ovarian cyst   . Paroxysmal atrial fibrillation (HCC)    Rapid atrial fibrillation in-hospital, Rapid cardioversion,  before mitral valve surgery  .  Pulmonary hypertension    Echo, April, 2013, before mitral valve surgery  . S/P Maze operation for atrial fibrillation 01/26/2012   Complete biatrial lesion set using cryothermy via right mini thoracotomy  . 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 Shortness of breath    Related to severe mitral regurgitation, April, 2013  . Warfarin anticoagulation    Mechanical mitral prosthesis, April, 20136    Patient Active Problem List   Diagnosis Date Noted  . Encounter for therapeutic drug monitoring 11/27/2013  . Palpitations 09/20/2013  . Mitral valve regurgitation   . Breast cancer ([place])   . Hypothyroidism   . CHF (congestive heart failure) ([place])   . Shortness of breath   . Paroxysmal atrial fibrillation (HCC)   . Neuromuscular disorder ([place])   . Arthritis   . COPD (chronic obstructive pulmonary disease) ([place])   . Neurogenic bladder   . Ejection fraction   . Warfarin anticoagulation   . First degree heart block 01/29/2012  . 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 S/P Maze operation for atrial fibrillation 01/26/2012  . 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 Hypokalemia 01/19/2012  . Anxiety 01/18/2012  . Tachycardia 01/18/2012  . Pulmonary hypertension 01/18/2012  . Hypoxemia 01/18/2012  . Multiple sclerosis (  [place]) 01/19/2007  . 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 OSTEOPENIA 01/19/2007  . URINARY INCONTINENCE 01/19/2007  . COLONOSCOPY, HX OF 01/19/2007  . GENITAL HERPES, HX OF 01/19/2007  . BREAST CANCER, HX OF 06/19/1998    Past Surgical History:  Procedure Laterality Date  . BREAST LUMPECTOMY    . CHEST TUBE INSERTION  01/26/2012   Procedure: CHEST TUBE INSERTION;  Surgeon: Rexene Alberts, MD;  Location: [facility];  Service: Open Heart Surgery;  Laterality: Left;  . CYSTOSCOPY    . HERNIA REPAIR    . LAPAROSCOPY    . LEFT AND RIGHT HEART CATHETERIZATION WITH CORONARY ANGIOGRAM N/A 01/20/2012    Procedure: LEFT AND RIGHT HEART CATHETERIZATION WITH CORONARY ANGIOGRAM;  Surgeon: Burnell Blanks, MD;  Location: [facility] CATH LAB;  Service: Cardiovascular;  Laterality: N/A;  . LYMPHADENECTOMY    . MAZE  01/26/2012   Procedure: MAZE;  Surgeon: Rexene Alberts, MD;  Location: [facility];  Service: Open Heart Surgery;  Laterality: N/A;  . MITRAL VALVE REPLACEMENT  01/26/2012   Procedure: MINIMALLY INVASIVE MITRAL VALVE (MV) REPLACEMENT;  Surgeon: Rexene Alberts, MD;  Location: [place];  Service: Open Heart Surgery;  Laterality: Right;  . TEE WITHOUT CARDIOVERSION  01/19/2012   Procedure: TRANSESOPHAGEAL ECHOCARDIOGRAM (TEE);  Surgeon: Peter M Martinique, MD;  Location[facility] ENDOSCOPY;  Service: Cardiovascular;  Laterality: N/A;  . TONSILLECTOMY    . WRIST SURGERY      OB History    No data available       Home Medications    Prior to Admission medications   Medication Sig Start Date End Date Taking? Authorizing Provider  traMADol (ULTRAM) 50 MG tablet Take 1 tablet (50 mg total) by mouth every 6 (six) hours as needed. 06/17/16  Yes Lajean Saver, MD  Black Cohosh 40 MG CAPS Take 1 capsule by mouth daily.    Historical Provider, MD  Bromelains (BROMELAIN PO) Take 1 capsule by mouth 2 (two) times daily.    Historical Provider, MD  Cholecalciferol (VITAMIN D) 2000 UNITS CAPS Take 6,000 Units by mouth daily.    Historical Provider, MD  Coenzyme Q10 (CO Q-10) 100 MG CAPS Take 1 capsule by mouth daily.     Historical Provider, MD  Cranberry 500 MG CAPS Take 1 capsule by mouth daily.    Historical Provider, MD  diazepam (VALIUM) 2 MG tablet Take 1 tablet (2 mg total) by mouth every 6 (six) hours as needed for muscle spasms. 07/31/16   Deno Etienne, DO  L-THEANINE PO Take 1 capsule by mouth daily.     Historical Provider, MD  lactose free nutrition (BOOST PLUS) LIQD Take 237 mLs by mouth daily.     Historical Provider, MD  lidocaine-prilocaine (EMLA) cream Apply 1 application topically as needed (per pt this is  for use before blood draws).  03/06/15   Historical Provider, MD  Lysine 500 MG CAPS Take 1 capsule by mouth daily.    Historical Provider, MD  metoprolol tartrate (LOPRESSOR) 25 MG tablet Take 1 tablet (25 mg total) by mouth 2 (two) times daily. 04/20/16   [name], MD  MILK THISTLE PO Take 1 capsule by mouth 2 (two) times daily.    Historical Provider, MD  Misc Natural Products (GLUCOSAMINE CHOND COMPLEX/MSM PO) Strength of dose Glucosamine 1500mg  /Chodroitron 1000mg / MSM 500mg  takes one twice daily    Historical Provider, MD  Olive Leaf 500 MG CAPS Take 1 capsule by mouth 2 (two) times daily.  Historical Provider, MD  OVER THE COUNTER MEDICATION Red marine algae - 1 capsules daily    Historical Provider, MD  Petasin (PETADOLEX PO) Take by mouth 2 (two) times daily.    Historical Provider, MD  Probiotic Product (PROBIOTIC DAILY PO) Take 1 capsule once a day    Historical Provider, MD  PROGESTERONE MICRONIZED PO Take 75 mg by mouth daily.     Historical Provider, MD  thyroid (ARMOUR) 60 MG tablet Take 60 mg by mouth daily.     Historical Provider, MD  traMADol (ULTRAM) 50 MG tablet Take 1 tablet (50 mg total) by mouth every 6 (six) hours as needed. 07/12/16   Shawn C Joy, PA-C  TURMERIC PO Take by mouth.    Historical Provider, MD  vitamin A 10000 UNIT capsule Take 10,000 Units by mouth daily.    Historical Provider, MD  vitamin E 400 UNIT capsule Take 400 Units by mouth daily.    Historical Provider, MD  warfarin (COUMADIN) 5 MG tablet Take as directed by Coumadin clinic 11/04/15   Dorothy Spark, MD    Family History Family History  Problem Relation Age of Onset  . Heart disease Father     cardiac arrest   . CAD Mother     5 stents and numerous bypass surgery  . CAD      Social History Social History  Substance Use Topics  . Smoking status: Never Smoker  . Smokeless tobacco: Never Used  . Alcohol use No     Allergies   Erythromycin   Review of Systems Review of  Systems  Constitutional: Negative for chills and fever.  HENT: Negative for congestion and rhinorrhea.   Eyes: Negative for redness and visual disturbance.  Respiratory: Negative for shortness of breath and wheezing.   Cardiovascular: Negative for chest pain and palpitations.  Gastrointestinal: Negative for nausea and vomiting.  Genitourinary: Negative for dysuria and urgency.  Musculoskeletal: Positive for arthralgias, back pain and myalgias.  Skin: Negative for pallor and wound.  Neurological: Negative for dizziness and headaches.     Physical Exam Updated Vital Signs BP 137/79   Pulse 79   Temp 98.1 F (36.7 C) (Oral)   Resp 16   Ht 5\' 9"  (1.753 m)   Wt 143 lb (64.9 kg)   SpO2 98%   BMI 21.12 kg/m   Physical Exam  Constitutional: She is oriented to person, place, and time. She appears well-developed and well-nourished. No distress.  HENT:  Head: Normocephalic and atraumatic.  Eyes: EOM are normal. Pupils are equal, round, and reactive to light.  Neck: Normal range of motion. Neck supple.  Cardiovascular: Normal rate and regular rhythm.  Exam reveals no gallop and no friction rub.   No murmur heard. Pulmonary/Chest: Effort normal. She has no wheezes. She has no rales.  Abdominal: Soft. She exhibits no distension and no mass. There is no tenderness. There is no guarding.  Musculoskeletal: She exhibits no edema or tenderness.  No noted pain on palpation of the low back, but patient points to bilateral SI joints.   Neurological: She is alert and oriented to person, place, and time.  Reflex Scores:      Patellar reflexes are 2+ on the right side and 2+ on the left side.      Achilles reflexes are 2+ on the right side and 2+ on the left side. Intact motor and sensation bilaterally, no clonus  Skin: Skin is warm and dry. She is not diaphoretic.  Psychiatric: She has a normal mood and affect. Her behavior is normal.  Nursing note and vitals reviewed.    ED Treatments /  Results  Labs (all labs ordered are listed, but only abnormal results are displayed) Labs Reviewed - No data to display  EKG  EKG Interpretation None       Radiology Dg Lumbar Spine Complete  Result Date: 07/31/2016 CLINICAL DATA:  Low back pain after fall 2 weeks ago. Initial encounter. EXAM: LUMBAR SPINE - COMPLETE 4+ VIEW COMPARISON:  06/17/2016 FINDINGS: Acute L2 compression fracture with up to 50% height loss. No signs of retropulsion. No subluxation. L5 compression deformity that was noted previously is even more prominent today, approaching 50%. No evidence of retropulsion at this level. Lumbar levocurvature which is mildly accentuated compared to prior. Lower lumbar facet arthropathy with mild L4-5 anterolisthesis. IMPRESSION: 1. Acute L2 compression fracture with ~50% height loss. No evidence of retropulsion. 2. More remote L5 compression fracture with progressed height loss since 06/17/2016. Electronically Signed   By: Monte Fantasia M.D.   On: 07/31/2016 16:12    Procedures Procedures (including critical care time)  Medications Ordered in ED Medications  diazepam (VALIUM) tablet 2 mg (2 mg Oral Given 07/31/16 1623)  ibuprofen (ADVIL,MOTRIN) tablet 800 mg (800 mg Oral Given 07/31/16 1626)     Initial Impression / Assessment and Plan / ED Course  I have reviewed the triage vital signs and the nursing notes.  Pertinent labs & imaging results that were available during my care of the patient were reviewed by me and considered in my medical decision making (see chart for details).  Clinical Course    65 yo F With a chief complaint of low back pain. This occurred post fall. Worse with movement palpation. She points to the bilateral SI joints. Will obtain a plain film.  X-ray with concern for an acute L2 compression fracture. This is about 50% height loss. Attempted to call for lumbar corset. This is not available in a reasonable timeframe. Patient is electing discharge  home. We'll have her follow-up with a neurosurgeon.  5:17 PM:  I have discussed the diagnosis/risks/treatment options with the patient and family and believe the pt to be eligible for discharge home to follow-up with Neurosurgry. We also discussed returning to the ED immediately if new or worsening sx occur. We discussed the sx which are most concerning (e.g., fever, leg numbness, weakness, loss of bowel, bladder) that necessitate immediate return. Medications administered to the patient during their visit and any new prescriptions provided to the patient are listed below.  Medications given during this visit Medications  diazepam (VALIUM) tablet 2 mg (2 mg Oral Given 07/31/16 1623)  ibuprofen (ADVIL,MOTRIN) tablet 800 mg (800 mg Oral Given 07/31/16 1626)     The patient appears reasonably screen and/or stabilized for discharge and I doubt any other medical condition or other [facility] requiring further screening, evaluation, or treatment in the ED at this time prior to discharge.    Final Clinical Impressions(s) / ED Diagnoses   Final diagnoses:  Closed compression fracture of second lumbar vertebra, initial encounter [facility])    New Prescriptions Discharge Medication List as of 07/31/2016  4:47 PM       Deno Etienne, DO 07/31/16 1717

## 2016-07-31 NOTE — ED Notes (Signed)
Patient transported to X-ray 

## 2016-07-31 NOTE — ED Notes (Signed)
Pt states the mechanism of the fall was from standing down on her coccyx.

## 2016-07-31 NOTE — Discharge Instructions (Signed)
Continue taking tylenol, discuss ibuprofen with your family doc.

## 2016-07-31 NOTE — ED Notes (Signed)
Attempt was made to contact ortho to fit pt for lumbar corsett. Pt will be fitted on an out patient basis. MD aware.

## 2016-08-03 ENCOUNTER — Encounter (HOSPITAL_COMMUNITY): Payer: Self-pay

## 2016-08-06 DIAGNOSIS — M8000XA Age-related osteoporosis with current pathological fracture, unspecified site, initial encounter for fracture: Secondary | ICD-10-CM | POA: Diagnosis not present

## 2016-08-06 DIAGNOSIS — S32020D Wedge compression fracture of second lumbar vertebra, subsequent encounter for fracture with routine healing: Secondary | ICD-10-CM | POA: Diagnosis not present

## 2016-08-06 DIAGNOSIS — I10 Essential (primary) hypertension: Secondary | ICD-10-CM | POA: Diagnosis not present

## 2016-08-07 ENCOUNTER — Encounter (HOSPITAL_COMMUNITY): Payer: Self-pay

## 2016-08-10 ENCOUNTER — Encounter (HOSPITAL_COMMUNITY): Payer: Self-pay

## 2016-08-11 ENCOUNTER — Ambulatory Visit (INDEPENDENT_AMBULATORY_CARE_PROVIDER_SITE_OTHER): Payer: Medicare Other | Admitting: Family Medicine

## 2016-08-11 ENCOUNTER — Encounter: Payer: Self-pay | Admitting: Family Medicine

## 2016-08-11 ENCOUNTER — Telehealth: Payer: Self-pay | Admitting: Family Medicine

## 2016-08-11 VITALS — BP 103/67 | HR 72 | Temp 98.0°F | Resp 20 | Ht 69.0 in | Wt 138.5 lb

## 2016-08-11 DIAGNOSIS — G35 Multiple sclerosis: Secondary | ICD-10-CM | POA: Diagnosis not present

## 2016-08-11 DIAGNOSIS — J439 Emphysema, unspecified: Secondary | ICD-10-CM

## 2016-08-11 DIAGNOSIS — Z9189 Other specified personal risk factors, not elsewhere classified: Secondary | ICD-10-CM

## 2016-08-11 DIAGNOSIS — I480 Paroxysmal atrial fibrillation: Secondary | ICD-10-CM | POA: Diagnosis not present

## 2016-08-11 DIAGNOSIS — Z7689 Persons encountering health services in other specified circumstances: Secondary | ICD-10-CM

## 2016-08-11 DIAGNOSIS — I5030 Unspecified diastolic (congestive) heart failure: Secondary | ICD-10-CM

## 2016-08-11 DIAGNOSIS — E039 Hypothyroidism, unspecified: Secondary | ICD-10-CM

## 2016-08-11 DIAGNOSIS — R296 Repeated falls: Secondary | ICD-10-CM | POA: Insufficient documentation

## 2016-08-11 DIAGNOSIS — M4856XS Collapsed vertebra, not elsewhere classified, lumbar region, sequela of fracture: Secondary | ICD-10-CM

## 2016-08-11 DIAGNOSIS — Z9181 History of falling: Secondary | ICD-10-CM

## 2016-08-11 NOTE — Patient Instructions (Signed)
It was  Nice to meet you . I will get records and then we will discuss further management on neurology. appt in 1 month for medicare wellness.

## 2016-08-11 NOTE — Telephone Encounter (Signed)
Please call patient: I have placed a home health safety eval was performed by physical therapy in the home to assess safety and her strength considering she has had multiple falls in the last couple months with a history of gait instability and multiple sclerosis. She will be contacted by them to set up an appointment time for them to visit in her home and evaluate.

## 2016-08-11 NOTE — Progress Notes (Signed)
Patient ID: [name] [name], female  DOB: 1951/09/12, 65 y.o.   MRN: [id number] Patient Care Team    Relationship Specialty Notifications Start End  Ma Hillock, DO PCP - General Family Medicine  07/31/16   Elsie Stain, MD Attending Physician Pulmonary Disease Abnormal results only, Admissions 03/03/12   Wallene Huh, DPM Consulting Physician Podiatry  08/11/16   Dorothy Spark, MD Consulting Physician Cardiology  08/11/16   Mingo Amber, MD  Alternative Medicine  08/11/16   Kevan Ny Ditty, MD Consulting Physician Neurosurgery  08/11/16   Jaynie Collins, elzabeth-- holistic doc  Subjective:  [name] [name] is a 65 y.o.  female present for new patient establishment. All past medical history, surgical history, allergies, family history, immunizations, medications and social history was obtained and updated in the electronic medical record today.  Patient presents for new patient establishment with a rather complex past medical history. She does not have any medical records with her today.   She is followed routinely by cardiology and prior to her recent falls and compression fractures she was regularly attending cardiac rehabilitation that was started after her mitral valve replacement in 2013. She is also underwent a maze operation for atrial fibrillation. She is prescribed Coumadin and is followed in the Coumadin clinic. Her last INR was rather elevated, and she has been instructed on lowering her dose by cardiology.  She has been to the emergency room 4 times in the last 6 months for falls. 2 of which she sustained compression fractures of the lower lumbar spine, and which she is being followed by neurology. She also sustained a knee injury and an ankle injury, which she reports is improving. By report each of the falls are mechanical in nature either slipping or tripping. Patient has a reported history of multiple sclerosis which she states is "relapsing/remitting type ".  She reports she was never followed by neurology regularly for this issue and had undergone blood test that was only "IgG positive ". Patient has a history of urinary incontinence, that she states a urologist told her that was secondary to her multiple sclerosis. She reports being offered treatment with Avonex by a neurologist in [place], but she did not want injections and taking care for the risk profile of suicidal ideations. She may be open to one of the new oral medications.   Patient does state she takes 4 g of Tylenol daily. She takes many supplements, which are over-the-counter and some are provided to her from her alternative medicine doctor.  Patient has a history of breast cancer in 1999 in which she underwent a lumpectomy of her right breast and sentinel node biopsy. Patient states that 20 nodes were removed and 16 of them were positive. She states she underwent 3 rounds of chemotherapy. She has a mammogram in 2014 with suspicious thickening in the area of her scar formation, I do not see where this has been worked up further.  Health maintenance:  Colonoscopy: completed 2010, by Dr. Olevia Perches, resutls normal. follow up 10 years. Mammogram: completed: 2014, birads 4, patient had a suspicious skin thickening area of prior scar, do not see where further investigation has been completed. Patient has family history and personal history of breast cancer.  Cervical cancer screening: last pap: 2016 by patient report. Immunizations: tdap 02/2011 Influenza unknown (encouraged yearly), PNA series unknown, zostavax unknown Infectious disease screening: Hep C unknown DEXA: Osteoporosis, unknown last DEXA scan   Dg Lumbar Spine Complete Result  Date: 07/31/2016  IMPRESSION: 1. Acute L2 compression fracture with ~50% height loss. No evidence of retropulsion. 2. More remote L5 compression fracture with progressed height loss since 06/17/2016. Electronically Signed   By: Monte Fantasia M.D.   On: 07/31/2016  16:12   CT head without contrast 01/31/2016: IMPRESSION: Atrophy with periventricular small vessel disease. 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 No hemorrhage or mass effect.  Echocardiogram 10/23/2015: ------------------------------------------------------------------- LV EF: 50% -   55%  ------------------------------------------------------------------- Indications:      Mitral Valve Replacement (Z95.4).  ------------------------------------------------------------------- History:   PMH:  Breast Cancer with Chemotherapy and Radiation, Multiple Sclerosis, Mitral Valve Replacement (2013)  Dyspnea. Atrial fibrillation.  Congestive heart failure.  Mitral valve disease.  Primary pulmonary hypertension.  Risk factors:  Family history of coronary artery disease.  ------------------------------------------------------------------- Study Conclusions  - Left ventricle: The cavity size was normal. Wall thickness was   normal. Systolic function was normal. The estimated ejection   fraction was in the range of 50% to 55%. Wall motion was normal;   there were no regional wall motion abnormalities. The study is   not technically sufficient to allow evaluation of LV diastolic   function. - Mitral valve: A mechanical prosthesis was present and functioning   normally. - Right ventricle: The cavity size was mildly dilated. Wall   thickness was normal. Impressions: - Compared to the prior study, there has been no significant   interval change.   Immunization History  Administered Date(s) Administered  . Td 10/21/2003  . Tdap 02/17/2011     Past Medical History:  Diagnosis Date  . Anxiety   . Arthritis    knees  . Breast cancer ([place]) 1999  . CHF (congestive heart failure) ([place])    Related to severe mitral regurgitation, April, 2013  . 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 Assess by pulmonary team in the  hospital April, 2013  . Ejection fraction    EF 60%, echo, April, 2013, with severe MR before mitral valve replacement  . Herpes   . Hypothyroidism   . Mitral valve regurgitation    Mitral valve replacement April, 2013, Mitral valve prolapse  . Multiple sclerosis ([place])   . Neurogenic bladder   . Osteoporosis   . Ovarian cyst   . Paroxysmal atrial fibrillation (HCC)    Rapid atrial fibrillation in-hospital, Rapid cardioversion,  before mitral valve surgery  . Pulmonary hypertension    Echo, April, 2013, before mitral valve surgery  . S/P Maze operation for atrial fibrillation 01/26/2012   Complete biatrial lesion set using cryothermy via right mini thoracotomy  . S/P mitral valve replacement 01/26/2012   3mm Sorin [employer]omedics Optiform mechanical prosthesis via right mini thoracotomy  . Warfarin anticoagulation    Mechanical mitral prosthesis, April, 20136   Allergies  Allergen Reactions  . Erythromycin Nausea And Vomiting   Past Surgical History:  Procedure Laterality Date  . BREAST LUMPECTOMY Right 1999   with sent.node, and axillary dissection (20)  . CHEST TUBE INSERTION  01/26/2012   Procedure: CHEST TUBE INSERTION;  Surgeon: Rexene Alberts, MD;  Location: [place];  Service: Open Heart Surgery;  Laterality: Left;  . CYSTOSCOPY  1992  . LAPAROSCOPIC OVARIAN CYSTECTOMY  1978   urethral stricture repair  . LEFT AND RIGHT HEART CATHETERIZATION WITH CORONARY ANGIOGRAM N/A 01/20/2012   Procedure: LEFT AND RIGHT HEART CATHETERIZATION WITH CORONARY ANGIOGRAM;  Surgeon: Burnell Blanks, MD;  Location: [facility]  CATH LAB;  Service: Cardiovascular;  Laterality: N/A;  . LYMPHADENECTOMY    . MAZE  01/26/2012   Procedure: MAZE;  Surgeon: Rexene Alberts, MD;  Location: [place];  Service: Open Heart Surgery;  Laterality: N/A;  . MITRAL VALVE REPLACEMENT  01/26/2012   Procedure: MINIMALLY INVASIVE MITRAL VALVE (MV) REPLACEMENT;  Surgeon: Rexene Alberts, MD;  Location: [place];  Service: Open Heart  Surgery;  Laterality: Right;  . TEE WITHOUT CARDIOVERSION  01/19/2012   Procedure: TRANSESOPHAGEAL ECHOCARDIOGRAM (TEE);  Surgeon: Peter M Martinique, MD;  Location: [facility] ENDOSCOPY;  Service: Cardiovascular;  Laterality: N/A;  . TONSILLECTOMY  1970  . [place]  . WRIST SURGERY Right 2012   Family History  Problem Relation Age of Onset  . Heart disease Father     cardiac arrest   . CAD Mother     5 stents and numerous bypass surgery  . CAD    . Breast cancer Maternal Grandmother   . Breast cancer Maternal Aunt    Social History   Social History  . Marital status: Widowed    Spouse name: N/A  . Number of children: 2  . Years of education: 16   Occupational History  . Retired    Social History Main Topics  . Smoking status: Never Smoker  . Smokeless tobacco: Never Used  . Alcohol use No  . Drug use: No  . Sexual activity: No   Other Topics Concern  . Not on file   Social History Narrative   Patient is a widower (since 59), he currently lives with her son and daughter-in-law. She has 2 children.   She is college educated, and retired from the [employer].   She uses herbal remedies, and multiple over-the-counter supplements. She takes a daily vitamin.   She wears her seatbelt, exercises routinely, smoke detector in the home.   Requires a walker or wheelchair at times.   Feels safe in her relationships.     Medication List       Accurate as of 08/11/16  7:16 PM. Always use your most recent med list.          Black Cohosh 40 MG Caps Take 1 capsule by mouth daily.   BROMELAIN PO Take 1 capsule by mouth 2 (two) times daily.   Co Q-10 100 MG Caps Take 1 capsule by mouth daily.   Cranberry 500 MG Caps Take 1 capsule by mouth daily.   ENDOMETRIN 100 MG vaginal insert Generic drug:  progesterone Take 75 mg by mouth daily.   GLUCOSAMINE CHOND COMPLEX/MSM PO Strength of dose Glucosamine 1500mg  /Chodroitron 1000mg / MSM 500mg  takes one twice  daily   L-THEANINE PO Take 1 capsule by mouth daily.   lactose free nutrition Liqd Take 237 mLs by mouth daily.   lidocaine-prilocaine cream Commonly known as:  EMLA Apply 1 application topically as needed (per pt this is for use before blood draws).   Lysine 500 MG Caps Take 1 capsule by mouth daily.   methocarbamol 750 MG tablet Commonly known as:  ROBAXIN Take 750 mg by mouth every 6 (six) hours.   metoprolol tartrate 25 MG tablet Commonly known as:  LOPRESSOR Take 1 tablet (25 mg total) by mouth 2 (two) times daily.   MILK THISTLE PO Take 1 capsule by mouth 2 (two) times daily.   Olive Leaf 500 MG Caps Take 1 capsule by mouth 2 (two) times daily.   OVER THE COUNTER MEDICATION  Red marine algae - 1 capsules daily   PETADOLEX PO Take by mouth 2 (two) times daily.   PROBIOTIC DAILY PO Take 1 capsule once a day   PROGESTERONE MICRONIZED PO Take 75 mg by mouth daily.   thyroid 60 MG tablet Commonly known as:  ARMOUR Take 60 mg by mouth daily.   traMADol 50 MG tablet Commonly known as:  ULTRAM Take 1 tablet (50 mg total) by mouth every 6 (six) hours as needed.   TURMERIC PO Take by mouth.   URIPLEX 500 MG Tabs Generic drug:  Pumpkin Seed Take 1 tablet by mouth 2 (two) times daily.   vitamin A 10000 UNIT capsule Take 10,000 Units by mouth daily.   Vitamin D3 2000 units capsule Take 2,000 Units by mouth daily.   vitamin E 400 UNIT capsule Take 400 Units by mouth daily.   warfarin 5 MG tablet Commonly known as:  COUMADIN Take as directed by Coumadin clinic       ROS: 14 pt review of systems performed and negative (unless mentioned in an HPI)  Objective: BP 103/67 (BP Location: Left Arm, Patient Position: Sitting, Cuff Size: Normal)   Pulse 72   Temp 98 F (36.7 C)   Resp 20   Ht 5\' 9"  (1.753 m)   Wt 138 lb 8 oz (62.8 kg)   SpO2 98%   BMI 20.45 kg/m  Gen: Afebrile. No acute distress. Nontoxic in appearance, well-developed, well-nourished,   in wheelchair. HENT: AT. [place]. MMM Eyes:Pupils Equal Round Reactive to light, Extraocular movements intact,  Conjunctiva without redness, discharge or icterus. Neck/lymp/endocrine: Supple CV: RRR, No edema Chest: CTAB, no wheeze, rhonchi or crackles.  Abd: Soft. Flat. NTND. BS present.  Skin: No rashes, bruising left forearm present. Neuro/Msk: In wheelchair. PERLA. EOMi. Alert. Oriented x3.   Psych: Normal affect, dress and demeanor. Normal speech. Normal thought content and judgment.  Assessment/plan: [name] [name] is a 65 y.o. female present for new pt establishment with multiple complaints and complex history. Patient does admit to having PCP a few years Ago at [facility], these records will be requested. She states they have her records from [place] as well. Multiple sclerosis ([place]) - Uncertain history surrounding her multiple sclerosis. Patient is with multiple falls in the last 6 months due to gait instability. - We will recommend home health eval for safety. -Have requested all records from [facility]. - refer to neurology, would like to have records prior. -I do not see any MRI brain imaging in the electronic medical record.  Paroxysmal atrial fibrillation (HCC)/Diastolic congestive heart failure, unspecified congestive heart failure chronicity (HCC)/pulmonary hypertension/first-degree heart block/mitral valve regurg - Patient on Coumadin, managed her Coumadin clinic for atrial fibrillation and mechanical mitral valve. Goal INR 2.5-3.5. Patient currently above goal, consider multiple over-the-counter supplements as interaction. She is on turmeric. - Patient is to continue follow-up with cardiology.  Pulmonary emphysema, unspecified emphysema type ([place]) - Emphysema/COPD Patient Record, Patient Does Not Admit to This Diagnosis Today. She Is Not on Any Medications/Inhalers. We'll Need to Compare to Prior Records.  History of breast cancer with abnormal mammogram in [id number]: -  Uncertain if patient continues to follow with oncology. - We'll encourage repeat mammogram and yearly mammogram.  Compression fractures: - Patient is to continue following with neurosurgery for her compression fractures. She has been prescribed Robaxin by Dr. Cyndy Freeze.   Hypothyroidism: Patient is prescribed levothyroxine, however last recorded TSH was in 2014. She has likely been tested with her alternative medicine  doctor, which no file are provided.  Patient encouraged to follow-up in 4 weeks for Medicare wellness or chronic medical condition appointment with provider. At that time fasting labs will be completed. Would like to have the chance to get some of her records from [facility] to see what she is lacking in her preventive medicine.  Greater than 45 minutes was spent with patient, greater than 50% of that time was spent face-to-face with patient counseling  Electronically signed by: Howard Pouch, DO [place]

## 2016-08-12 NOTE — Telephone Encounter (Signed)
Spoke with patient reviewed information. Patient in agreement with plan of care.

## 2016-08-14 ENCOUNTER — Encounter (HOSPITAL_COMMUNITY): Payer: Self-pay

## 2016-08-14 DIAGNOSIS — G35 Multiple sclerosis: Secondary | ICD-10-CM | POA: Diagnosis not present

## 2016-08-14 DIAGNOSIS — I480 Paroxysmal atrial fibrillation: Secondary | ICD-10-CM

## 2016-08-14 DIAGNOSIS — R296 Repeated falls: Secondary | ICD-10-CM | POA: Diagnosis not present

## 2016-08-14 DIAGNOSIS — M6281 Muscle weakness (generalized): Secondary | ICD-10-CM | POA: Diagnosis not present

## 2016-08-14 DIAGNOSIS — J439 Emphysema, unspecified: Secondary | ICD-10-CM | POA: Diagnosis not present

## 2016-08-14 DIAGNOSIS — F419 Anxiety disorder, unspecified: Secondary | ICD-10-CM | POA: Diagnosis not present

## 2016-08-14 DIAGNOSIS — S32029S Unspecified fracture of second lumbar vertebra, sequela: Secondary | ICD-10-CM | POA: Diagnosis not present

## 2016-08-14 DIAGNOSIS — I270 Primary pulmonary hypertension: Secondary | ICD-10-CM | POA: Diagnosis not present

## 2016-08-14 DIAGNOSIS — M170 Bilateral primary osteoarthritis of knee: Secondary | ICD-10-CM | POA: Diagnosis not present

## 2016-08-14 DIAGNOSIS — I5030 Unspecified diastolic (congestive) heart failure: Secondary | ICD-10-CM

## 2016-08-14 DIAGNOSIS — Z7901 Long term (current) use of anticoagulants: Secondary | ICD-10-CM

## 2016-08-14 DIAGNOSIS — N312 Flaccid neuropathic bladder, not elsewhere classified: Secondary | ICD-10-CM

## 2016-08-17 ENCOUNTER — Telehealth: Payer: Self-pay | Admitting: Family Medicine

## 2016-08-17 ENCOUNTER — Encounter (HOSPITAL_COMMUNITY): Payer: Self-pay

## 2016-08-17 NOTE — Telephone Encounter (Signed)
[name] [name] is a PT from [facility] calling to request verbal order for PT  2 x weekly x 5 weeks to strengthen, safe transfer, and gait balance training.  Please advise.

## 2016-08-17 NOTE — Telephone Encounter (Signed)
Left message on voice mail Ok to proceed with PT as requested. Dr Raoul Pitch will sign orders.

## 2016-08-18 ENCOUNTER — Telehealth: Payer: Self-pay | Admitting: Family Medicine

## 2016-08-18 DIAGNOSIS — G35 Multiple sclerosis: Secondary | ICD-10-CM

## 2016-08-18 DIAGNOSIS — M170 Bilateral primary osteoarthritis of knee: Secondary | ICD-10-CM | POA: Diagnosis not present

## 2016-08-18 DIAGNOSIS — J439 Emphysema, unspecified: Secondary | ICD-10-CM | POA: Diagnosis not present

## 2016-08-18 DIAGNOSIS — M4856XS Collapsed vertebra, not elsewhere classified, lumbar region, sequela of fracture: Secondary | ICD-10-CM

## 2016-08-18 DIAGNOSIS — I270 Primary pulmonary hypertension: Secondary | ICD-10-CM | POA: Diagnosis not present

## 2016-08-18 DIAGNOSIS — S32029S Unspecified fracture of second lumbar vertebra, sequela: Secondary | ICD-10-CM | POA: Diagnosis not present

## 2016-08-18 DIAGNOSIS — I5030 Unspecified diastolic (congestive) heart failure: Secondary | ICD-10-CM | POA: Diagnosis not present

## 2016-08-18 NOTE — Telephone Encounter (Signed)
[name], physical therapist calling to request script for rolling walker with seat, faxed to [phone], please also send demographic sheet with script.

## 2016-08-20 DIAGNOSIS — I270 Primary pulmonary hypertension: Secondary | ICD-10-CM | POA: Diagnosis not present

## 2016-08-20 DIAGNOSIS — J439 Emphysema, unspecified: Secondary | ICD-10-CM | POA: Diagnosis not present

## 2016-08-20 DIAGNOSIS — M170 Bilateral primary osteoarthritis of knee: Secondary | ICD-10-CM | POA: Diagnosis not present

## 2016-08-20 DIAGNOSIS — I5030 Unspecified diastolic (congestive) heart failure: Secondary | ICD-10-CM | POA: Diagnosis not present

## 2016-08-20 DIAGNOSIS — S32029S Unspecified fracture of second lumbar vertebra, sequela: Secondary | ICD-10-CM | POA: Diagnosis not present

## 2016-08-20 DIAGNOSIS — G35 Multiple sclerosis: Secondary | ICD-10-CM | POA: Diagnosis not present

## 2016-08-21 ENCOUNTER — Encounter: Payer: Self-pay | Admitting: Family Medicine

## 2016-08-21 ENCOUNTER — Encounter (HOSPITAL_COMMUNITY): Payer: Self-pay

## 2016-08-21 NOTE — Telephone Encounter (Signed)
completed

## 2016-08-21 NOTE — Telephone Encounter (Signed)
Faxed

## 2016-08-24 ENCOUNTER — Encounter (HOSPITAL_COMMUNITY): Payer: Self-pay

## 2016-08-24 DIAGNOSIS — S32029S Unspecified fracture of second lumbar vertebra, sequela: Secondary | ICD-10-CM | POA: Diagnosis not present

## 2016-08-24 DIAGNOSIS — G35 Multiple sclerosis: Secondary | ICD-10-CM | POA: Diagnosis not present

## 2016-08-24 DIAGNOSIS — I270 Primary pulmonary hypertension: Secondary | ICD-10-CM | POA: Diagnosis not present

## 2016-08-24 DIAGNOSIS — M170 Bilateral primary osteoarthritis of knee: Secondary | ICD-10-CM | POA: Diagnosis not present

## 2016-08-24 DIAGNOSIS — J439 Emphysema, unspecified: Secondary | ICD-10-CM | POA: Diagnosis not present

## 2016-08-24 DIAGNOSIS — I5030 Unspecified diastolic (congestive) heart failure: Secondary | ICD-10-CM | POA: Diagnosis not present

## 2016-08-25 ENCOUNTER — Telehealth: Payer: Self-pay | Admitting: Family Medicine

## 2016-08-25 NOTE — Telephone Encounter (Signed)
Requesting verbal order for home health OT, twice a week for 2 weeks.  Please return call to [place], [phone].

## 2016-08-25 NOTE — Telephone Encounter (Signed)
Ok to start OT as recommended . Notified Sharyn Lull she will fax orders to be signed.

## 2016-08-27 DIAGNOSIS — I5030 Unspecified diastolic (congestive) heart failure: Secondary | ICD-10-CM | POA: Diagnosis not present

## 2016-08-27 DIAGNOSIS — I270 Primary pulmonary hypertension: Secondary | ICD-10-CM | POA: Diagnosis not present

## 2016-08-27 DIAGNOSIS — J439 Emphysema, unspecified: Secondary | ICD-10-CM | POA: Diagnosis not present

## 2016-08-27 DIAGNOSIS — S32029S Unspecified fracture of second lumbar vertebra, sequela: Secondary | ICD-10-CM | POA: Diagnosis not present

## 2016-08-27 DIAGNOSIS — M170 Bilateral primary osteoarthritis of knee: Secondary | ICD-10-CM | POA: Diagnosis not present

## 2016-08-27 DIAGNOSIS — G35 Multiple sclerosis: Secondary | ICD-10-CM | POA: Diagnosis not present

## 2016-08-28 ENCOUNTER — Encounter (HOSPITAL_COMMUNITY): Payer: Self-pay

## 2016-08-28 DIAGNOSIS — I270 Primary pulmonary hypertension: Secondary | ICD-10-CM | POA: Diagnosis not present

## 2016-08-28 DIAGNOSIS — S32029S Unspecified fracture of second lumbar vertebra, sequela: Secondary | ICD-10-CM | POA: Diagnosis not present

## 2016-08-28 DIAGNOSIS — M170 Bilateral primary osteoarthritis of knee: Secondary | ICD-10-CM | POA: Diagnosis not present

## 2016-08-28 DIAGNOSIS — G35 Multiple sclerosis: Secondary | ICD-10-CM | POA: Diagnosis not present

## 2016-08-28 DIAGNOSIS — J439 Emphysema, unspecified: Secondary | ICD-10-CM | POA: Diagnosis not present

## 2016-08-28 DIAGNOSIS — I5030 Unspecified diastolic (congestive) heart failure: Secondary | ICD-10-CM | POA: Diagnosis not present

## 2016-08-31 ENCOUNTER — Encounter (HOSPITAL_COMMUNITY): Payer: Self-pay

## 2016-08-31 DIAGNOSIS — J439 Emphysema, unspecified: Secondary | ICD-10-CM | POA: Diagnosis not present

## 2016-08-31 DIAGNOSIS — I270 Primary pulmonary hypertension: Secondary | ICD-10-CM | POA: Diagnosis not present

## 2016-08-31 DIAGNOSIS — M170 Bilateral primary osteoarthritis of knee: Secondary | ICD-10-CM | POA: Diagnosis not present

## 2016-08-31 DIAGNOSIS — I5030 Unspecified diastolic (congestive) heart failure: Secondary | ICD-10-CM | POA: Diagnosis not present

## 2016-08-31 DIAGNOSIS — G35 Multiple sclerosis: Secondary | ICD-10-CM | POA: Diagnosis not present

## 2016-08-31 DIAGNOSIS — S32029S Unspecified fracture of second lumbar vertebra, sequela: Secondary | ICD-10-CM | POA: Diagnosis not present

## 2016-09-01 ENCOUNTER — Telehealth: Payer: Self-pay | Admitting: Family Medicine

## 2016-09-01 ENCOUNTER — Emergency Department (HOSPITAL_BASED_OUTPATIENT_CLINIC_OR_DEPARTMENT_OTHER)
Admission: EM | Admit: 2016-09-01 | Discharge: 2016-09-01 | Disposition: A | Discharge status: Home / Self Care | Payer: Medicare Other | Attending: Emergency Medicine | Admitting: Emergency Medicine

## 2016-09-01 ENCOUNTER — Encounter (HOSPITAL_BASED_OUTPATIENT_CLINIC_OR_DEPARTMENT_OTHER): Payer: Self-pay | Admitting: *Deleted

## 2016-09-01 ENCOUNTER — Emergency Department (HOSPITAL_BASED_OUTPATIENT_CLINIC_OR_DEPARTMENT_OTHER): Payer: Medicare Other

## 2016-09-01 DIAGNOSIS — M25551 Pain in right hip: Secondary | ICD-10-CM | POA: Insufficient documentation

## 2016-09-01 DIAGNOSIS — J449 Chronic obstructive pulmonary disease, unspecified: Secondary | ICD-10-CM | POA: Diagnosis not present

## 2016-09-01 DIAGNOSIS — M170 Bilateral primary osteoarthritis of knee: Secondary | ICD-10-CM | POA: Diagnosis not present

## 2016-09-01 DIAGNOSIS — Z79899 Other long term (current) drug therapy: Secondary | ICD-10-CM | POA: Insufficient documentation

## 2016-09-01 DIAGNOSIS — I270 Primary pulmonary hypertension: Secondary | ICD-10-CM | POA: Diagnosis not present

## 2016-09-01 DIAGNOSIS — Z853 Personal history of malignant neoplasm of breast: Secondary | ICD-10-CM | POA: Insufficient documentation

## 2016-09-01 DIAGNOSIS — I509 Heart failure, unspecified: Secondary | ICD-10-CM | POA: Diagnosis not present

## 2016-09-01 DIAGNOSIS — E039 Hypothyroidism, unspecified: Secondary | ICD-10-CM | POA: Diagnosis not present

## 2016-09-01 DIAGNOSIS — S32029S Unspecified fracture of second lumbar vertebra, sequela: Secondary | ICD-10-CM | POA: Diagnosis not present

## 2016-09-01 DIAGNOSIS — I110 Hypertensive heart disease with heart failure: Secondary | ICD-10-CM | POA: Diagnosis not present

## 2016-09-01 DIAGNOSIS — J439 Emphysema, unspecified: Secondary | ICD-10-CM | POA: Diagnosis not present

## 2016-09-01 DIAGNOSIS — G35 Multiple sclerosis: Secondary | ICD-10-CM | POA: Diagnosis not present

## 2016-09-01 DIAGNOSIS — I5030 Unspecified diastolic (congestive) heart failure: Secondary | ICD-10-CM | POA: Diagnosis not present

## 2016-09-01 MED ORDER — METHOCARBAMOL 750 MG PO TABS: 750.0000 mg | ORAL_TABLET | Freq: Three times a day (TID) | ORAL | 0 refills | Status: DC | PRN

## 2016-09-01 NOTE — Telephone Encounter (Signed)
Physical Therapist with [facility] needs demographic sheet faxed to [place] at [phone]. This was supposed to go with DME Rx for [place] that was faxed 08/25/16 from our office. Thanks

## 2016-09-01 NOTE — ED Notes (Signed)
Pt verbalizes understanding of d/c instructions and denies any further needs at this time. 

## 2016-09-01 NOTE — ED Provider Notes (Signed)
[facility] DEPT [facility] Provider Note   CSN: [id number] Arrival date & time: 09/01/16  1643     History   Chief Complaint Chief Complaint  Patient presents with  . Hip Pain    HPI [name] [name] is a 65 y.o. female.  Patient presents with right hip pain. She is a 65 year old female and she's recently had 2 compression fractures in her lumbar spine as well as a flareup of knee pain. She states that she was at home and she twisted to the right and felt a pull in her right lateral hip area. She feels like it maybe muscular but she's concerned that there may be an injury to the bone. She is able to bear weight but it does hurt to walk on it. She denies any other injuries. She is currently receiving physical therapy and occupational therapy at home. She uses a walker to ambulate at home since these recent injuries have occurred.      Past Medical History:  Diagnosis Date  . Anxiety   . Arthritis    knees  . Breast cancer ([place]) 1999  . CHF (congestive heart failure) ([place])    Related to severe mitral regurgitation, April, 2013  . 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 Assess by pulmonary team in the hospital April, 2013  . Ejection fraction    EF 60%, echo, April, 2013, with severe MR before mitral valve replacement  . Herpes   . Hypothyroidism   . IBS (irritable bowel syndrome)   . Mitral valve regurgitation    Mitral valve replacement April, 2013, Mitral valve prolapse  . Multiple sclerosis ([place])   . Neurogenic bladder   . Osteoporosis   . Ovarian cyst   . Paroxysmal atrial fibrillation (HCC)    Rapid atrial fibrillation in-hospital, Rapid cardioversion,  before mitral valve surgery  . Pulmonary hypertension    Echo, April, 2013, before mitral valve surgery  . S/P Maze operation for atrial fibrillation 01/26/2012   Complete biatrial lesion set using cryothermy via right mini thoracotomy  . S/P mitral valve replacement 01/26/2012   10mm Sorin [employer]omedics Optiform mechanical prosthesis via right mini thoracotomy  . Warfarin anticoagulation    Mechanical mitral prosthesis, April, 20136    Patient Active Problem List   Diagnosis Date Noted  . Compression fracture of lumbar spine, non-traumatic, sequela 08/11/2016  . Multiple falls 08/11/2016  . At high risk for injury related to fall 08/11/2016  . Encounter for therapeutic drug monitoring 11/27/2013  . Mitral valve regurgitation   . Hypothyroidism   . CHF (congestive heart failure) ([place])   . Paroxysmal atrial fibrillation (HCC)   . Arthritis   . COPD (chronic obstructive pulmonary disease) ([place])   . Neurogenic bladder   . Warfarin anticoagulation   . First degree heart block 01/29/2012  . 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 S/P Maze operation for atrial fibrillation 01/26/2012  . Anxiety 01/18/2012  . Pulmonary hypertension 01/18/2012  . Multiple sclerosis ([place]) 01/19/2007    Past Surgical History:  Procedure Laterality Date  . BREAST LUMPECTOMY Right 1999   with sent.node, and axillary dissection (20)  . CHEST TUBE INSERTION  01/26/2012   Procedure: CHEST TUBE INSERTION;  Surgeon: Rexene Alberts, MD;  Location: [facility];  Service: Open Heart Surgery;  Laterality: Left;  . COLONOSCOPY  2010   "normal"  . CYSTOSCOPY  1992  . LAPAROSCOPIC OVARIAN CYSTECTOMY  1978   urethral stricture repair  .  LEFT AND RIGHT HEART CATHETERIZATION WITH CORONARY ANGIOGRAM N/A 01/20/2012   Procedure: LEFT AND RIGHT HEART CATHETERIZATION WITH CORONARY ANGIOGRAM;  Surgeon: Burnell Blanks, MD;  Location: [facility] CATH LAB;  Service: Cardiovascular;  Laterality: N/A;  . LYMPHADENECTOMY    . MAZE  01/26/2012   Procedure: MAZE;  Surgeon: Rexene Alberts, MD;  Location: [facility];  Service: Open Heart Surgery;  Laterality: N/A;  . MITRAL VALVE REPLACEMENT  01/26/2012   Procedure: MINIMALLY INVASIVE MITRAL VALVE (MV) REPLACEMENT;  Surgeon: Rexene Alberts, MD;  Location: [facility];  Service:  Open Heart Surgery;  Laterality: Right;  . TEE WITHOUT CARDIOVERSION  01/19/2012   Procedure: TRANSESOPHAGEAL ECHOCARDIOGRAM (TEE);  Surgeon: Peter M Martinique, MD;  Location: [facility] ENDOSCOPY;  Service: Cardiovascular;  Laterality: N/A;  . TONSILLECTOMY  1970  . [place]  . WRIST SURGERY Right 2012    OB History    Gravida Para Term Preterm AB Living   2         2   SAB TAB Ectopic Multiple Live Births                   Home Medications    Prior to Admission medications   Medication Sig Start Date End Date Taking? Authorizing Provider  Black Cohosh 40 MG CAPS Take 1 capsule by mouth daily.    Historical Provider, MD  Bromelains (BROMELAIN PO) Take 1 capsule by mouth 2 (two) times daily.    Historical Provider, MD  Cholecalciferol (VITAMIN D3) 2000 units capsule Take 2,000 Units by mouth daily.    Historical Provider, MD  Coenzyme Q10 (CO Q-10) 100 MG CAPS Take 1 capsule by mouth daily.     Historical Provider, MD  Cranberry 500 MG CAPS Take 1 capsule by mouth daily.    Historical Provider, MD  L-THEANINE PO Take 1 capsule by mouth daily.     Historical Provider, MD  lactose free nutrition (BOOST PLUS) LIQD Take 237 mLs by mouth daily.     Historical Provider, MD  lidocaine-prilocaine (EMLA) cream Apply 1 application topically as needed (per pt this is for use before blood draws).  03/06/15   Historical Provider, MD  Lysine 500 MG CAPS Take 1 capsule by mouth daily.    Historical Provider, MD  methocarbamol (ROBAXIN) 750 MG tablet Take 1 tablet (750 mg total) by mouth every 8 (eight) hours as needed for muscle spasms. 09/01/16   Malvin Johns, MD  metoprolol tartrate (LOPRESSOR) 25 MG tablet Take 1 tablet (25 mg total) by mouth 2 (two) times daily. 04/20/16   [name], MD  MILK THISTLE PO Take 1 capsule by mouth 2 (two) times daily.    Historical Provider, MD  Misc Natural Products (GLUCOSAMINE CHOND COMPLEX/MSM PO) Strength of dose Glucosamine 1500mg  /Chodroitron  1000mg / MSM 500mg  takes one twice daily    Historical Provider, MD  Olive Leaf 500 MG CAPS Take 1 capsule by mouth 2 (two) times daily.     Historical Provider, MD  OVER THE COUNTER MEDICATION Red marine algae - 1 capsules daily    Historical Provider, MD  Petasin (PETADOLEX PO) Take by mouth 2 (two) times daily.    Historical Provider, MD  Probiotic Product (PROBIOTIC DAILY PO) Take 1 capsule once a day    Historical Provider, MD  progesterone (ENDOMETRIN) 100 MG vaginal insert Take 75 mg by mouth daily.    Historical Provider, MD  PROGESTERONE MICRONIZED PO Take 75 mg  by mouth daily.     Historical Provider, MD  Pumpkin Seed (URIPLEX) 500 MG TABS Take 1 tablet by mouth 2 (two) times daily.    Historical Provider, MD  thyroid (ARMOUR) 60 MG tablet Take 60 mg by mouth daily.     Historical Provider, MD  traMADol (ULTRAM) 50 MG tablet Take 1 tablet (50 mg total) by mouth every 6 (six) hours as needed. 07/12/16   Shawn C Joy, PA-C  TURMERIC PO Take by mouth.    Historical Provider, MD  vitamin A 10000 UNIT capsule Take 10,000 Units by mouth daily.    Historical Provider, MD  vitamin E 400 UNIT capsule Take 400 Units by mouth daily.    Historical Provider, MD  warfarin (COUMADIN) 5 MG tablet Take as directed by Coumadin clinic 11/04/15   Dorothy Spark, MD    Family History Family History  Problem Relation Age of Onset  . Heart disease Father     cardiac arrest   . CAD Father   . Hypertension Father   . CAD Mother     5 stents and numerous bypass surgery  . Hypertension Mother   . Hyperlipidemia Mother   . CAD    . Breast cancer Maternal Grandmother   . Breast cancer Maternal Aunt     Social History Social History  Substance Use Topics  . Smoking status: Never Smoker  . Smokeless tobacco: Never Used  . Alcohol use No     Allergies   Erythromycin   Review of Systems Review of Systems  Constitutional: Negative for fever.  Gastrointestinal: Negative for nausea and vomiting.   Musculoskeletal: Positive for arthralgias. Negative for back pain, joint swelling and neck pain.  Skin: Negative for wound.  Neurological: Negative for weakness, numbness and headaches.     Physical Exam Updated Vital Signs BP 136/64   Pulse 68   Temp 97.7 F (36.5 C) (Oral)   Resp 16   Ht 5\' 9"  (1.753 m)   Wt 140 lb (63.5 kg)   SpO2 100%   BMI 20.67 kg/m   Physical Exam  Constitutional: She is oriented to person, place, and time. She appears well-developed and well-nourished.  HENT:  Head: Normocephalic and atraumatic.  Neck: Normal range of motion. Neck supple.  Cardiovascular: Normal rate.   Pulmonary/Chest: Effort normal.  Musculoskeletal: She exhibits tenderness. She exhibits no edema.  Patient has tenderness along the right lateral buttocks area. There some mild pain on range of motion of the hip. There is no significant pain along the spine. No pain currently to the knee or ankle. There is no swelling to the leg. Pedal pulses are intact. She has normal sensation and motor function in the leg.  Neurological: She is alert and oriented to person, place, and time.  Skin: Skin is warm and dry.  Psychiatric: She has a normal mood and affect.     ED Treatments / Results  Labs (all labs ordered are listed, but only abnormal results are displayed) Labs Reviewed - No data to display  EKG  EKG Interpretation None       Radiology Dg Hip Unilat W Or Wo Pelvis 2-3 Views Right  Result Date: 09/01/2016 CLINICAL DATA:  Pain following twisting injury 1 day prior. History of multiple sclerosis. History of prior breast carcinoma. EXAM: DG HIP (WITH OR WITHOUT PELVIS) 2-3V RIGHT COMPARISON:  None. FINDINGS: Frontal pelvis as well as frontal and lateral right hip images were obtained. There is no demonstrable fracture  or dislocation. Joint spaces appear unremarkable. No erosive change. No blastic or lytic bone lesions are evident. There is periarticular osteoporosis. There is  rotatory scoliosis in the visualize lower lumbar spine. IMPRESSION: No evident fracture or dislocation. No appreciable hip joint space narrowing or erosion. There is periarticular osteoporosis. Electronically Signed   By: Lowella Grip III M.D.   On: 09/01/2016 18:58    Procedures Procedures (including critical care time)  Medications Ordered in ED Medications - No data to display   Initial Impression / Assessment and Plan / ED Course  I have reviewed the triage vital signs and the nursing notes.  Pertinent labs & imaging results that were available during my care of the patient were reviewed by me and considered in my medical decision making (see chart for details).  Clinical Course     There is no evidence of fracture on x-ray. Patient is able to bear weight on the leg. The pain seems to be more lateral and is likely muscular in nature. She has more pain on palpation over the musculature than she does on range of motion of the hip joint itself. Given this, I don't feel that she needs CT imaging to rule out an occult fracture. She was discharged home in good condition. She is going to continue PT and OT at home. She states that Robaxin has helped her a lot in the past and she is requesting a refill on her Robaxin. She was encouraged to follow-up with her PCP if her symptoms are not improving.  Final Clinical Impressions(s) / ED Diagnoses   Final diagnoses:  Right hip pain    New Prescriptions Current Discharge Medication List       Malvin Johns, MD 09/01/16 1921

## 2016-09-01 NOTE — ED Triage Notes (Signed)
Pt reports standing and turning and now feels like she pulled a muscle in her right hip.  Pt reports that she uses a walker at home.

## 2016-09-02 NOTE — Telephone Encounter (Signed)
[name] [name],  We should not be providing that to a medical supply company. [facility] can contact the patient by phone to obtain that information.   [name]

## 2016-09-03 ENCOUNTER — Telehealth: Payer: Self-pay | Admitting: Family Medicine

## 2016-09-03 DIAGNOSIS — G35 Multiple sclerosis: Secondary | ICD-10-CM | POA: Diagnosis not present

## 2016-09-03 DIAGNOSIS — M170 Bilateral primary osteoarthritis of knee: Secondary | ICD-10-CM | POA: Diagnosis not present

## 2016-09-03 DIAGNOSIS — I270 Primary pulmonary hypertension: Secondary | ICD-10-CM | POA: Diagnosis not present

## 2016-09-03 DIAGNOSIS — I5030 Unspecified diastolic (congestive) heart failure: Secondary | ICD-10-CM | POA: Diagnosis not present

## 2016-09-03 DIAGNOSIS — J439 Emphysema, unspecified: Secondary | ICD-10-CM | POA: Diagnosis not present

## 2016-09-03 DIAGNOSIS — S32029S Unspecified fracture of second lumbar vertebra, sequela: Secondary | ICD-10-CM | POA: Diagnosis not present

## 2016-09-03 NOTE — Telephone Encounter (Signed)
Pt states that she needs her demographics sent to the DME place for her walker, pt states that this was to be done 2 wks ago.

## 2016-09-04 ENCOUNTER — Encounter (HOSPITAL_COMMUNITY): Payer: Self-pay

## 2016-09-04 DIAGNOSIS — S32029S Unspecified fracture of second lumbar vertebra, sequela: Secondary | ICD-10-CM | POA: Diagnosis not present

## 2016-09-04 DIAGNOSIS — J439 Emphysema, unspecified: Secondary | ICD-10-CM | POA: Diagnosis not present

## 2016-09-04 DIAGNOSIS — I270 Primary pulmonary hypertension: Secondary | ICD-10-CM | POA: Diagnosis not present

## 2016-09-04 DIAGNOSIS — I5030 Unspecified diastolic (congestive) heart failure: Secondary | ICD-10-CM | POA: Diagnosis not present

## 2016-09-04 DIAGNOSIS — G35 Multiple sclerosis: Secondary | ICD-10-CM | POA: Diagnosis not present

## 2016-09-04 DIAGNOSIS — M170 Bilateral primary osteoarthritis of knee: Secondary | ICD-10-CM | POA: Diagnosis not present

## 2016-09-07 ENCOUNTER — Encounter (HOSPITAL_COMMUNITY): Payer: Self-pay

## 2016-09-07 DIAGNOSIS — J439 Emphysema, unspecified: Secondary | ICD-10-CM | POA: Diagnosis not present

## 2016-09-07 DIAGNOSIS — I5030 Unspecified diastolic (congestive) heart failure: Secondary | ICD-10-CM | POA: Diagnosis not present

## 2016-09-07 DIAGNOSIS — I270 Primary pulmonary hypertension: Secondary | ICD-10-CM | POA: Diagnosis not present

## 2016-09-07 DIAGNOSIS — S32029S Unspecified fracture of second lumbar vertebra, sequela: Secondary | ICD-10-CM | POA: Diagnosis not present

## 2016-09-07 DIAGNOSIS — M170 Bilateral primary osteoarthritis of knee: Secondary | ICD-10-CM | POA: Diagnosis not present

## 2016-09-07 DIAGNOSIS — G35 Multiple sclerosis: Secondary | ICD-10-CM | POA: Diagnosis not present

## 2016-09-08 ENCOUNTER — Telehealth: Payer: Self-pay | Admitting: *Deleted

## 2016-09-08 DIAGNOSIS — I270 Primary pulmonary hypertension: Secondary | ICD-10-CM | POA: Diagnosis not present

## 2016-09-08 DIAGNOSIS — M170 Bilateral primary osteoarthritis of knee: Secondary | ICD-10-CM | POA: Diagnosis not present

## 2016-09-08 DIAGNOSIS — G35 Multiple sclerosis: Secondary | ICD-10-CM | POA: Diagnosis not present

## 2016-09-08 DIAGNOSIS — J439 Emphysema, unspecified: Secondary | ICD-10-CM | POA: Diagnosis not present

## 2016-09-08 DIAGNOSIS — I5030 Unspecified diastolic (congestive) heart failure: Secondary | ICD-10-CM | POA: Diagnosis not present

## 2016-09-08 DIAGNOSIS — S32029S Unspecified fracture of second lumbar vertebra, sequela: Secondary | ICD-10-CM | POA: Diagnosis not present

## 2016-09-08 NOTE — Telephone Encounter (Signed)
Order placed sent message to Florentina Addison at [facility]. Notified patient that order was placed

## 2016-09-08 NOTE — Telephone Encounter (Signed)
Please call patient about walker.

## 2016-09-08 NOTE — Telephone Encounter (Signed)
[name] from [facility] called states since they cannot provide rolling walker with seat in house we may need to order this from [facility]. Per Dr Raoul Pitch this was addressed on Friday by [name] will check with her to find out resolution.

## 2016-09-09 DIAGNOSIS — I270 Primary pulmonary hypertension: Secondary | ICD-10-CM | POA: Diagnosis not present

## 2016-09-09 DIAGNOSIS — I5030 Unspecified diastolic (congestive) heart failure: Secondary | ICD-10-CM | POA: Diagnosis not present

## 2016-09-09 DIAGNOSIS — G35 Multiple sclerosis: Secondary | ICD-10-CM | POA: Diagnosis not present

## 2016-09-09 DIAGNOSIS — J439 Emphysema, unspecified: Secondary | ICD-10-CM | POA: Diagnosis not present

## 2016-09-09 DIAGNOSIS — S32029S Unspecified fracture of second lumbar vertebra, sequela: Secondary | ICD-10-CM | POA: Diagnosis not present

## 2016-09-09 DIAGNOSIS — M170 Bilateral primary osteoarthritis of knee: Secondary | ICD-10-CM | POA: Diagnosis not present

## 2016-09-11 DIAGNOSIS — G35 Multiple sclerosis: Secondary | ICD-10-CM | POA: Diagnosis not present

## 2016-09-11 DIAGNOSIS — J439 Emphysema, unspecified: Secondary | ICD-10-CM | POA: Diagnosis not present

## 2016-09-11 DIAGNOSIS — S32029S Unspecified fracture of second lumbar vertebra, sequela: Secondary | ICD-10-CM | POA: Diagnosis not present

## 2016-09-11 DIAGNOSIS — I5030 Unspecified diastolic (congestive) heart failure: Secondary | ICD-10-CM | POA: Diagnosis not present

## 2016-09-11 DIAGNOSIS — I270 Primary pulmonary hypertension: Secondary | ICD-10-CM | POA: Diagnosis not present

## 2016-09-11 DIAGNOSIS — M170 Bilateral primary osteoarthritis of knee: Secondary | ICD-10-CM | POA: Diagnosis not present

## 2016-09-14 ENCOUNTER — Telehealth: Payer: Self-pay | Admitting: *Deleted

## 2016-09-14 ENCOUNTER — Encounter (HOSPITAL_COMMUNITY): Payer: Self-pay

## 2016-09-14 NOTE — Telephone Encounter (Signed)
Patient called and left message stating she had recently been given an Rx by a Dr at [facility] for Robaxin for back pain she is almost out and would like to know if you can send in a refill on this medication. She states this is helping her back but she feels like she needs additional dosing. Please advise.

## 2016-09-15 ENCOUNTER — Telehealth: Payer: Self-pay | Admitting: Cardiology

## 2016-09-15 DIAGNOSIS — I5030 Unspecified diastolic (congestive) heart failure: Secondary | ICD-10-CM | POA: Diagnosis not present

## 2016-09-15 DIAGNOSIS — G35 Multiple sclerosis: Secondary | ICD-10-CM | POA: Diagnosis not present

## 2016-09-15 DIAGNOSIS — J439 Emphysema, unspecified: Secondary | ICD-10-CM | POA: Diagnosis not present

## 2016-09-15 DIAGNOSIS — I270 Primary pulmonary hypertension: Secondary | ICD-10-CM | POA: Diagnosis not present

## 2016-09-15 DIAGNOSIS — S32029S Unspecified fracture of second lumbar vertebra, sequela: Secondary | ICD-10-CM | POA: Diagnosis not present

## 2016-09-15 DIAGNOSIS — M170 Bilateral primary osteoarthritis of knee: Secondary | ICD-10-CM | POA: Diagnosis not present

## 2016-09-15 MED ORDER — METHOCARBAMOL 750 MG PO TABS: 750.0000 mg | ORAL_TABLET | Freq: Three times a day (TID) | ORAL | 0 refills | Status: DC | PRN

## 2016-09-15 NOTE — Telephone Encounter (Signed)
Spoke with patient reviewed information . Patient will follow up with cardiology for her INR/coumadin management.

## 2016-09-15 NOTE — Telephone Encounter (Signed)
Will refill, caution with sedation.  Also received request for her INR to be drawn here. We can perform this once during her visit for convenience, however this is not something we would perform routinely and she would need to continue to have her coumadin managed at coumadin clinic through cardiology, [facility] coumadin clinic  or if she is unable to make it to her cardiologist, they could try to order this by home health.

## 2016-09-15 NOTE — Telephone Encounter (Signed)
Will have INR done at PCP on Friday, we will be glad to have pt continued to have INR's managed and monitored by cardiology Coumadin Clinic, however if pt cannot come into clinic may need Home Health assessment ordered by PCP.  We cannot order home health to just go out to check pt's INR HH has to be in the home providing other services for pt such as nursing or PT.  Looks like pt's indication for being home bound is not cardiac related, but is related to fall and complications/ fractures related to fall therefore a cardiology referral to [facility] is not appropriate.  Please assess pt at [facility] for need for home health services, if you think pt qualifies for [facility] services please refer.  Thanks

## 2016-09-15 NOTE — Telephone Encounter (Signed)
Pt states she is home bound at the present and has been x the past 3 months.  Pt states no one has been monitoring her Coumadin x past 3 months.  Advised pt we cannot refill Warfarin rx without having an INR checked first.  Explained to pt this is for her safety.  Pt verbalized understanding, pt states she can only get out of the house with daughter transporting her and using a walker.  Pt states she has an appt with her new primary care MD for bloodwork on Friday 09/18/16 will forward a message to her requesting INR to be drawn with other labwork given INR has not been checked since 06/15/16.  Pt states she has enough Coumadin tablets to get her to that appt for INR check.  Will await results to dose and refill rx. Pt will request INR be drawn by PCP as well.

## 2016-09-15 NOTE — Telephone Encounter (Signed)
New message  Pt needs new RX on Warfarin 5mg  90days  [facility]/[address]

## 2016-09-15 NOTE — Telephone Encounter (Signed)
Please see phone note to pt. thanks

## 2016-09-16 ENCOUNTER — Telehealth: Payer: Self-pay

## 2016-09-16 NOTE — Telephone Encounter (Signed)
[name] [name], Pt, from [facility] called requesting verbal order for continuation of PT bid for 2 weeks. Please advise.

## 2016-09-16 NOTE — Telephone Encounter (Signed)
Please give verbal order to continue PT.

## 2016-09-17 ENCOUNTER — Telehealth: Payer: Self-pay | Admitting: Cardiology

## 2016-09-17 DIAGNOSIS — S32029S Unspecified fracture of second lumbar vertebra, sequela: Secondary | ICD-10-CM | POA: Diagnosis not present

## 2016-09-17 DIAGNOSIS — G35 Multiple sclerosis: Secondary | ICD-10-CM | POA: Diagnosis not present

## 2016-09-17 DIAGNOSIS — I270 Primary pulmonary hypertension: Secondary | ICD-10-CM | POA: Diagnosis not present

## 2016-09-17 DIAGNOSIS — I5030 Unspecified diastolic (congestive) heart failure: Secondary | ICD-10-CM | POA: Diagnosis not present

## 2016-09-17 DIAGNOSIS — J439 Emphysema, unspecified: Secondary | ICD-10-CM | POA: Diagnosis not present

## 2016-09-17 DIAGNOSIS — M170 Bilateral primary osteoarthritis of knee: Secondary | ICD-10-CM | POA: Diagnosis not present

## 2016-09-17 NOTE — Telephone Encounter (Signed)
Pt talked to Tiffany in Coumadin yesterday. And would like to speak to her again--pls call [phone]

## 2016-09-17 NOTE — Telephone Encounter (Signed)
Verbal order given via voicemail.  

## 2016-09-17 NOTE — Telephone Encounter (Signed)
Telephoned pt back in regards to [facility] appt, She will back to schedule when she finds a ride.

## 2016-09-18 ENCOUNTER — Ambulatory Visit (INDEPENDENT_AMBULATORY_CARE_PROVIDER_SITE_OTHER): Payer: Medicare Other

## 2016-09-18 ENCOUNTER — Encounter (HOSPITAL_COMMUNITY): Payer: Self-pay

## 2016-09-18 VITALS — BP 118/58 | HR 81 | Ht 69.25 in | Wt 133.8 lb

## 2016-09-18 DIAGNOSIS — Z7901 Long term (current) use of anticoagulants: Secondary | ICD-10-CM | POA: Diagnosis not present

## 2016-09-18 DIAGNOSIS — Z5181 Encounter for therapeutic drug level monitoring: Secondary | ICD-10-CM | POA: Diagnosis not present

## 2016-09-18 DIAGNOSIS — Z780 Asymptomatic menopausal state: Secondary | ICD-10-CM | POA: Diagnosis not present

## 2016-09-18 DIAGNOSIS — Z1239 Encounter for other screening for malignant neoplasm of breast: Secondary | ICD-10-CM

## 2016-09-18 DIAGNOSIS — Z0000 Encounter for general adult medical examination without abnormal findings: Secondary | ICD-10-CM

## 2016-09-18 DIAGNOSIS — Z1231 Encounter for screening mammogram for malignant neoplasm of breast: Secondary | ICD-10-CM

## 2016-09-18 NOTE — Addendum Note (Signed)
Addended by: Gerilyn Nestle on: 09/18/2016 04:42 PM   Modules accepted: Orders

## 2016-09-18 NOTE — Progress Notes (Signed)
Pre visit review using our clinic review tool, if applicable. No additional management support is needed unless otherwise documented below in the visit note. 

## 2016-09-18 NOTE — Patient Instructions (Addendum)
Continue to eat heart healthy diet (full of fruits, vegetables, whole grains, lean protein, water--limit salt, fat, and sugar intake) and increase physical activity as tolerated.  Continue doing brain stimulating activities (puzzles, reading, adult coloring books, staying active) to keep memory sharp.   Bring a copy of your advance directives to your next office visit.  Schedule mammogram and bone scan  Fall Prevention in the Home Introduction Falls can cause injuries. They can happen to people of all ages. There are many things you can do to make your home safe and to help prevent falls. What can I do on the outside of my home?  Regularly fix the edges of walkways and driveways and fix any cracks.  Remove anything that might make you trip as you walk through a door, such as a raised step or threshold.  Trim any bushes or trees on the path to your home.  Use bright outdoor lighting.  Clear any walking paths of anything that might make someone trip, such as rocks or tools.  Regularly check to see if handrails are loose or broken. Make sure that both sides of any steps have handrails.  Any raised decks and porches should have guardrails on the edges.  Have any leaves, snow, or ice cleared regularly.  Use sand or salt on walking paths during winter.  Clean up any spills in your garage right away. This includes oil or grease spills. What can I do in the bathroom?  Use night lights.  Install grab bars by the toilet and in the tub and shower. Do not use towel bars as grab bars.  Use non-skid mats or decals in the tub or shower.  If you need to sit down in the shower, use a plastic, non-slip stool.  Keep the floor dry. Clean up any water that spills on the floor as soon as it happens.  Remove soap buildup in the tub or shower regularly.  Attach bath mats securely with double-sided non-slip rug tape.  Do not have throw rugs and other things on the floor that can make you  trip. What can I do in the bedroom?  Use night lights.  Make sure that you have a light by your bed that is easy to reach.  Do not use any sheets or blankets that are too big for your bed. They should not hang down onto the floor.  Have a firm chair that has side arms. You can use this for support while you get dressed.  Do not have throw rugs and other things on the floor that can make you trip. What can I do in the kitchen?  Clean up any spills right away.  Avoid walking on wet floors.  Keep items that you use a lot in easy-to-reach places.  If you need to reach something above you, use a strong step stool that has a grab bar.  Keep electrical cords out of the way.  Do not use floor polish or wax that makes floors slippery. If you must use wax, use non-skid floor wax.  Do not have throw rugs and other things on the floor that can make you trip. What can I do with my stairs?  Do not leave any items on the stairs.  Make sure that there are handrails on both sides of the stairs and use them. Fix handrails that are broken or loose. Make sure that handrails are as long as the stairways.  Check any carpeting to make sure that it  is firmly attached to the stairs. Fix any carpet that is loose or worn.  Avoid having throw rugs at the top or bottom of the stairs. If you do have throw rugs, attach them to the floor with carpet tape.  Make sure that you have a light switch at the top of the stairs and the bottom of the stairs. If you do not have them, ask someone to add them for you. What else can I do to help prevent falls?  Wear shoes that:  Do not have high heels.  Have rubber bottoms.  Are comfortable and fit you well.  Are closed at the toe. Do not wear sandals.  If you use a stepladder:  Make sure that it is fully opened. Do not climb a closed stepladder.  Make sure that both sides of the stepladder are locked into place.  Ask someone to hold it for you, if  possible.  Clearly mark and make sure that you can see:  Any grab bars or handrails.  First and last steps.  Where the edge of each step is.  Use tools that help you move around (mobility aids) if they are needed. These include:  Canes.  Walkers.  Scooters.  Crutches.  Turn on the lights when you go into a dark area. Replace any light bulbs as soon as they burn out.  Set up your furniture so you have a clear path. Avoid moving your furniture around.  If any of your floors are uneven, fix them.  If there are any pets around you, be aware of where they are.  Review your medicines with your doctor. Some medicines can make you feel dizzy. This can increase your chance of falling. Ask your doctor what other things that you can do to help prevent falls. This information is not intended to replace advice given to you by your health care provider. Make sure you discuss any questions you have with your health care provider. Document Released: 08/01/2009 Document Revised: 03/12/2016 Document Reviewed: 11/09/2014  2017 [employer]  Health Maintenance, Female Introduction Adopting a healthy lifestyle and getting preventive care can go a long way to promote health and wellness. Talk with your health care provider about what schedule of regular examinations is right for you. This is a good chance for you to check in with your provider about disease prevention and staying healthy. In between checkups, there are plenty of things you can do on your own. Experts have done a lot of research about which lifestyle changes and preventive measures are most likely to keep you healthy. Ask your health care provider for more information. Weight and diet Eat a healthy diet  Be sure to include plenty of vegetables, fruits, low-fat dairy products, and lean protein.  Do not eat a lot of foods high in solid fats, added sugars, or salt.  Get regular exercise. This is one of the most important things you  can do for your health.  Most adults should exercise for at least 150 minutes each week. The exercise should increase your heart rate and make you sweat (moderate-intensity exercise).  Most adults should also do strengthening exercises at least twice a week. This is in addition to the moderate-intensity exercise. Maintain a healthy weight  Body mass index (BMI) is a measurement that can be used to identify possible weight problems. It estimates body fat based on height and weight. Your health care provider can help determine your BMI and help you achieve or maintain a  healthy weight.  For females 33 years of age and older:  A BMI below 18.5 is considered underweight.  A BMI of 18.5 to 24.9 is normal.  A BMI of 25 to 29.9 is considered overweight.  A BMI of 30 and above is considered obese. Watch levels of cholesterol and blood lipids  You should start having your blood tested for lipids and cholesterol at 67 years of age, then have this test every 5 years.  You may need to have your cholesterol levels checked more often if:  Your lipid or cholesterol levels are high.  You are older than 66 years of age.  You are at high risk for heart disease. Cancer screening Lung Cancer  Lung cancer screening is recommended for adults 37-75 years old who are at high risk for lung cancer because of a history of smoking.  A yearly low-dose CT scan of the lungs is recommended for people who:  Currently smoke.  Have quit within the past 15 years.  Have at least a 30-pack-year history of smoking. A pack year is smoking an average of one pack of cigarettes a day for 1 year.  Yearly screening should continue until it has been 15 years since you quit.  Yearly screening should stop if you develop a health problem that would prevent you from having lung cancer treatment. Breast Cancer  Practice breast self-awareness. This means understanding how your breasts normally appear and feel.  It also  means doing regular breast self-exams. Let your health care provider know about any changes, no matter how small.  If you are in your 20s or 30s, you should have a clinical breast exam (CBE) by a health care provider every 1-3 years as part of a regular health exam.  If you are 61 or older, have a CBE every year. Also consider having a breast X-ray (mammogram) every year.  If you have a family history of breast cancer, talk to your health care provider about genetic screening.  If you are at high risk for breast cancer, talk to your health care provider about having an MRI and a mammogram every year.  Breast cancer gene (BRCA) assessment is recommended for women who have family members with BRCA-related cancers. BRCA-related cancers include:  Breast.  Ovarian.  Tubal.  Peritoneal cancers.  Results of the assessment will determine the need for genetic counseling and BRCA1 and BRCA2 testing. Cervical Cancer  Your health care provider may recommend that you be screened regularly for cancer of the pelvic organs (ovaries, uterus, and vagina). This screening involves a pelvic examination, including checking for microscopic changes to the surface of your cervix (Pap test). You may be encouraged to have this screening done every 3 years, beginning at age 74.  For women ages 22-65, health care providers may recommend pelvic exams and Pap testing every 3 years, or they may recommend the Pap and pelvic exam, combined with testing for human papilloma virus (HPV), every 5 years. Some types of HPV increase your risk of cervical cancer. Testing for HPV may also be done on women of any age with unclear Pap test results.  Other health care providers may not recommend any screening for nonpregnant women who are considered low risk for pelvic cancer and who do not have symptoms. Ask your health care provider if a screening pelvic exam is right for you.  If you have had past treatment for cervical cancer or  a condition that could lead to cancer, you need Pap  tests and screening for cancer for at least 20 years after your treatment. If Pap tests have been discontinued, your risk factors (such as having a new sexual partner) need to be reassessed to determine if screening should resume. Some women have medical problems that increase the chance of getting cervical cancer. In these cases, your health care provider may recommend more frequent screening and Pap tests. Colorectal Cancer  This type of cancer can be detected and often prevented.  Routine colorectal cancer screening usually begins at 67 years of age and continues through 67 years of age.  Your health care provider may recommend screening at an earlier age if you have risk factors for colon cancer.  Your health care provider may also recommend using home test kits to check for hidden blood in the stool.  A small camera at the end of a tube can be used to examine your colon directly (sigmoidoscopy or colonoscopy). This is done to check for the earliest forms of colorectal cancer.  Routine screening usually begins at age 50.  Direct examination of the colon should be repeated every 5-10 years through 67 years of age. However, you may need to be screened more often if early forms of precancerous polyps or small growths are found. Skin Cancer  Check your skin from head to toe regularly.  Tell your health care provider about any new moles or changes in moles, especially if there is a change in a mole's shape or color.  Also tell your health care provider if you have a mole that is larger than the size of a pencil eraser.  Always use sunscreen. Apply sunscreen liberally and repeatedly throughout the day.  Protect yourself by wearing long sleeves, pants, a wide-brimmed hat, and sunglasses whenever you are outside. Heart disease, diabetes, and high blood pressure  High blood pressure causes heart disease and increases the risk of stroke. High  blood pressure is more likely to develop in:  People who have blood pressure in the high end of the normal range (130-139/85-89 mm Hg).  People who are overweight or obese.  People who are African American.  If you are 37-20 years of age, have your blood pressure checked every 3-5 years. If you are 33 years of age or older, have your blood pressure checked every year. You should have your blood pressure measured twice-once when you are at a hospital or clinic, and once when you are not at a hospital or clinic. Record the average of the two measurements. To check your blood pressure when you are not at a hospital or clinic, you can use:  An automated blood pressure machine at a pharmacy.  A home blood pressure monitor.  If you are between 16 years and 58 years old, ask your health care provider if you should take aspirin to prevent strokes.  Have regular diabetes screenings. This involves taking a blood sample to check your fasting blood sugar level.  If you are at a normal weight and have a low risk for diabetes, have this test once every three years after 67 years of age.  If you are overweight and have a high risk for diabetes, consider being tested at a younger age or more often. Preventing infection Hepatitis B  If you have a higher risk for hepatitis B, you should be screened for this virus. You are considered at high risk for hepatitis B if:  You were born in a country where hepatitis B is common. Ask your health  care provider which countries are considered high risk.  Your parents were born in a high-risk country, and you have not been immunized against hepatitis B (hepatitis B vaccine).  You have HIV or AIDS.  You use needles to inject street drugs.  You live with someone who has hepatitis B.  You have had sex with someone who has hepatitis B.  You get hemodialysis treatment.  You take certain medicines for conditions, including cancer, organ transplantation, and  autoimmune conditions. Hepatitis C  Blood testing is recommended for:  Everyone born from [birth year] through 1967.  Anyone with known risk factors for hepatitis C. Sexually transmitted infections (STIs)  You should be screened for sexually transmitted infections (STIs) including gonorrhea and chlamydia if:  You are sexually active and are younger than 67 years of age.  You are older than 67 years of age and your health care provider tells you that you are at risk for this type of infection.  Your sexual activity has changed since you were last screened and you are at an increased risk for chlamydia or gonorrhea. Ask your health care provider if you are at risk.  If you do not have HIV, but are at risk, it may be recommended that you take a prescription medicine daily to prevent HIV infection. This is called pre-exposure prophylaxis (PrEP). You are considered at risk if:  You are sexually active and do not regularly use condoms or know the HIV status of your partner(s).  You take drugs by injection.  You are sexually active with a partner who has HIV. Talk with your health care provider about whether you are at high risk of being infected with HIV. If you choose to begin PrEP, you should first be tested for HIV. You should then be tested every 3 months for as long as you are taking PrEP. Pregnancy  If you are premenopausal and you may become pregnant, ask your health care provider about preconception counseling.  If you may become pregnant, take 400 to 800 micrograms (mcg) of folic acid every day.  If you want to prevent pregnancy, talk to your health care provider about birth control (contraception). Osteoporosis and menopause  Osteoporosis is a disease in which the bones lose minerals and strength with aging. This can result in serious bone fractures. Your risk for osteoporosis can be identified using a bone density scan.  If you are 4 years of age or older, or if you are at risk for  osteoporosis and fractures, ask your health care provider if you should be screened.  Ask your health care provider whether you should take a calcium or vitamin D supplement to lower your risk for osteoporosis.  Menopause may have certain physical symptoms and risks.  Hormone replacement therapy may reduce some of these symptoms and risks. Talk to your health care provider about whether hormone replacement therapy is right for you. Follow these instructions at home:  Schedule regular health, dental, and eye exams.  Stay current with your immunizations.  Do not use any tobacco products including cigarettes, chewing tobacco, or electronic cigarettes.  If you are pregnant, do not drink alcohol.  If you are breastfeeding, limit how much and how often you drink alcohol.  Limit alcohol intake to no more than 1 drink per day for nonpregnant women. One drink equals 12 ounces of beer, 5 ounces of wine, or 1 ounces of hard liquor.  Do not use street drugs.  Do not share needles.  Ask your health  care provider for help if you need support or information about quitting drugs.  Tell your health care provider if you often feel depressed.  Tell your health care provider if you have ever been abused or do not feel safe at home. This information is not intended to replace advice given to you by your health care provider. Make sure you discuss any questions you have with your health care provider. Document Released: 04/20/2011 Document Revised: 03/12/2016 Document Reviewed: 07/09/2015  2017 [employer]

## 2016-09-18 NOTE — Progress Notes (Addendum)
Subjective:   [name] [name] is a 65 y.o. female who presents for an Initial Medicare Annual Wellness Visit. She is accompanied by her daughter-in-law, "[place]".   The Patient was informed that the wellness visit is to identify future health risk and educate and initiate measures that can reduce risk for increased disease through the lifespan.   Describes health as fair, good or great? "fair to good" Typically good (prior to back injury)  Review of Systems    No ROS.  Medicare Wellness Visit.  Cardiac Risk Factors include: advanced age (>25men, >26 women);family history of premature cardiovascular disease   Sleep patterns: Sleeps 6 hours, uses depends/pads instead of getting up to restroom.  Home Safety/Smoke Alarms:  Smoke detectors in place.  Living environment; residence and Firearm Safety: Lives alone with cat in 3 story home, uses rails.  No firearms.  Seat Belt Safety/Bike Helmet: Wears seat belt.    Counseling:   Eye Exam-Last exam 2015, h/o cataracts (no surgery). Followed by [facility] yearly. Will make appt.  Dental-Last 2017, every 6 months by Dr. Joneen Caraway  Female:   Pap-Last 2014 , followed by Dr. Dellis Filbert ([facility])    Mammo-05/30/13. Bx/US 07/12/13, benign. H/O breast ca. Patient reports "scar lump" at incision that is  "just scar tissue".  Order placed, [facility].        Dexa scan-Last 2014, patient reports osteopenia. Order placed, [facility].  CCS-colonoscopy 02/22/2009, normal. Recall 10 years.        Objective:    Today's Vitals   09/18/16 1433  BP: (!) 118/58  Pulse: 81  SpO2: 98%  Weight: 133 lb 12.8 oz (60.7 kg)  Height: 5' 9.25" (1.759 m)  PainSc: 1    Body mass index is 19.62 kg/m.   Current Medications (verified) Outpatient Encounter Prescriptions as of 09/18/2016  Medication Sig  . Bromelains (BROMELAIN PO) Take 1 capsule by mouth 2 (two) times daily.  . Cholecalciferol (VITAMIN D3) 2000 units capsule Take 2,000 Units by mouth daily.  . Coenzyme Q10  (CO Q-10) 100 MG CAPS Take 1 capsule by mouth daily.   . Cranberry 500 MG CAPS Take 1 capsule by mouth daily.  Marland Kitchen L-THEANINE PO Take 1 capsule by mouth daily.   Marland Kitchen lactose free nutrition (BOOST PLUS) LIQD Take 237 mLs by mouth daily.   Marland Kitchen lidocaine-prilocaine (EMLA) cream Apply 1 application topically as needed (per pt this is for use before blood draws).   . Lysine 500 MG CAPS Take 1 capsule by mouth daily.  . methocarbamol (ROBAXIN) 750 MG tablet Take 1 tablet (750 mg total) by mouth every 8 (eight) hours as needed for muscle spasms.  . metoprolol tartrate (LOPRESSOR) 25 MG tablet Take 1 tablet (25 mg total) by mouth 2 (two) times daily.  Marland Kitchen MILK THISTLE PO Take 1 capsule by mouth 2 (two) times daily.  . Misc Natural Products (GLUCOSAMINE CHOND COMPLEX/MSM PO) Strength of dose Glucosamine 1500mg  /Chodroitron 1000mg / MSM 500mg  takes one twice daily  . Olive Leaf 500 MG CAPS Take 1 capsule by mouth 2 (two) times daily.   Marland Kitchen OVER THE COUNTER MEDICATION Red marine algae - 1 capsules daily  . Petasin (PETADOLEX PO) Take by mouth 2 (two) times daily.  . Probiotic Product (PROBIOTIC DAILY PO) Take 1 capsule once a day  . progesterone (ENDOMETRIN) 100 MG vaginal insert Take 75 mg by mouth daily.  Marland Kitchen PROGESTERONE MICRONIZED PO Take 75 mg by mouth daily.   . Pumpkin Seed (URIPLEX) 500 MG  TABS Take 1 tablet by mouth 2 (two) times daily.  Marland Kitchen thyroid (ARMOUR) 60 MG tablet Take 60 mg by mouth daily.   . TURMERIC PO Take by mouth.  . vitamin A 10000 UNIT capsule Take 10,000 Units by mouth daily.  . vitamin E 400 UNIT capsule Take 400 Units by mouth daily.  Marland Kitchen warfarin (COUMADIN) 5 MG tablet Take as directed by Coumadin clinic  . Black Cohosh 40 MG CAPS Take 1 capsule by mouth daily.  . traMADol (ULTRAM) 50 MG tablet Take 1 tablet (50 mg total) by mouth every 6 (six) hours as needed. (Patient not taking: Reported on 09/18/2016)   No facility-administered encounter medications on file as of 09/18/2016.      Allergies (verified) Erythromycin   History: Past Medical History:  Diagnosis Date  . Anxiety   . Arthritis    knees  . Breast cancer ([place]) 1999  . CHF (congestive heart failure) ([place])    Related to severe mitral regurgitation, April, 2013  . 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 Assess by pulmonary team in the hospital April, 2013  . Ejection fraction    EF 60%, echo, April, 2013, with severe MR before mitral valve replacement  . Herpes   . Hypothyroidism   . IBS (irritable bowel syndrome)   . Mitral valve regurgitation    Mitral valve replacement April, 2013, Mitral valve prolapse  . Multiple sclerosis ([place])   . Neurogenic bladder   . Osteoporosis   . Ovarian cyst   . Paroxysmal atrial fibrillation (HCC)    Rapid atrial fibrillation in-hospital, Rapid cardioversion,  before mitral valve surgery  . Pulmonary hypertension    Echo, April, 2013, before mitral valve surgery  . S/P Maze operation for atrial fibrillation 01/26/2012   Complete biatrial lesion set using cryothermy via right mini thoracotomy  . S/P mitral valve replacement 01/26/2012   9mm Sorin Carbomedics Optiform mechanical prosthesis via right mini thoracotomy  . Warfarin anticoagulation    Mechanical mitral prosthesis, April, 20136   Past Surgical History:  Procedure Laterality Date  . BREAST LUMPECTOMY Right 1999   with sent.node, and axillary dissection (20)  . CHEST TUBE INSERTION  01/26/2012   Procedure: CHEST TUBE INSERTION;  Surgeon: Rexene Alberts, MD;  Location: [facility];  Service: Open Heart Surgery;  Laterality: Left;  . COLONOSCOPY  2010   "normal"  . CYSTOSCOPY  1992  . LAPAROSCOPIC OVARIAN CYSTECTOMY  1978   urethral stricture repair  . LEFT AND RIGHT HEART CATHETERIZATION WITH CORONARY ANGIOGRAM N/A 01/20/2012   Procedure: LEFT AND RIGHT HEART CATHETERIZATION WITH CORONARY ANGIOGRAM;  Surgeon: Burnell Blanks, MD;  Location[facility] C[facility] LAB;  Service:  Cardiovascular;  Laterality: N/A;  . LYMPHADENECTOMY    . MAZE  01/26/2012   Procedure: MAZE;  Surgeon: Rexene Alberts, MD;  Location: [facility];  Service: Open Heart Surgery;  Laterality: N/A;  . MITRAL VALVE REPLACEMENT  01/26/2012   Procedure: MINIMALLY INVASIVE MITRAL VALVE (MV) REPLACEMENT;  Surgeon: Rexene Alberts, MD;  Location: [facility];  Service: Open Heart Surgery;  Laterality: Right;  . TEE WITHOUT CARDIOVERSION  01/19/2012   Procedure: TRANSESOPHAGEAL ECHOCARDIOGRAM (TEE);  Surgeon: Peter M Martinique, MD;  Location: [facility] ENDOSCOPY;  Service: Cardiovascular;  Laterality: N/A;  . TONSILLECTOMY  1970  . [place]  . WRIST SURGERY Right 2012   Family History  Problem Relation Age of Onset  . Heart disease Father  cardiac arrest   . CAD Father   . Hypertension Father   . CAD Mother     5 stents and numerous bypass surgery  . Hypertension Mother   . Hyperlipidemia Mother   . CAD    . Breast cancer Maternal Grandmother   . Breast cancer Maternal Aunt    Social History   Occupational History  . Retired    Social History Main Topics  . Smoking status: Never Smoker  . Smokeless tobacco: Never Used  . Alcohol use No  . Drug use: No  . Sexual activity: No    Tobacco Counseling Counseling given: Not Answered   Activities of Daily Living In your present state of health, do you have any difficulty performing the following activities: 09/18/2016  Hearing? N  Vision? N  Difficulty concentrating or making decisions? N  Walking or climbing stairs? N  Dressing or bathing? N  Doing errands, shopping? N  Preparing Food and eating ? N  Using the Toilet? N  In the past six months, have you accidently leaked urine? Y  Do you have problems with loss of bowel control? N  Managing your Medications? N  Managing your Finances? N  Housekeeping or managing your Housekeeping? N  Some recent data might be hidden    Immunizations and Health Maintenance Immunization  History  Administered Date(s) Administered  . Td 10/21/2003  . Tdap 02/17/2011   Health Maintenance Due  Topic Date Due  . Hepatitis C Screening  27-Jan-1951  . ZOSTAVAX  04/24/2009  . DEXA SCAN  04/24/2014  . PNA vac Low Risk Adult (1 of 2 - PCV13) 04/24/2014  . MAMMOGRAM  05/31/2015  . INFLUENZA VACCINE  05/19/2016    Patient Care Team: Ma Hillock, DO as PCP - General (Family Medicine) Elsie Stain, MD as Attending Physician (Pulmonary Disease) Wallene Huh, DPM as Consulting Physician (Podiatry) Dorothy Spark, MD as Consulting Physician (Cardiology) Mingo Amber, MD (Alternative Medicine) Kevan Ny Ditty, MD as Consulting Physician (Neurosurgery) Princess Bruins, MD as Consulting Physician (Obstetrics and Gynecology) Vinnie Level (Dentistry) Druscilla Brownie, MD as Consulting Physician (Dermatology)  Indicate any recent Medical Services you may have received from other than [facility] providers in the past year (date may be approximate).     Assessment:   This is a routine wellness examination for [facility]. Physical assessment deferred to PCP.   Hearing/Vision screen Hearing Screening Comments: Able to hear conversational tones w/o difficulty. No issues reported.   Vision Screening Comments: Wears corrective lenses to 20/20 vision.   Dietary issues and exercise activities discussed: Current Exercise Habits: Home exercise routine (PT twice weekly, cardiac rehab, at home personal trainer), Type of exercise: strength training/weights;stretching (trainer at home), Time (Minutes): 30, Frequency (Times/Week): 3, Weekly Exercise (Minutes/Week): 90, Intensity: Mild, Exercise limited by: cardiac condition(s);orthopedic condition(s)   Diet (meal preparation, eat out, water intake, caffeinated beverages, dairy products, fruits and vegetables):  Patient avoids:raw foods, red meat, pork, acidic foods, and chocolate. Patient typically eats: Poultry, fish, cooked fruits  and vegetables Drinks water, boost, aloe vera fruit drink and Herbal/green tea.   Patient encouraged to continue healthy food options. Patient currently limited in exercise due to compression fractures, working with Pt, OT and personal trainer at home.    Goals      Patient Stated   . <enter goal here> (pt-stated)          "to be able to walk again"      Depression Screen  PHQ 2/9 Scores 09/18/2016 08/11/2016 03/09/2012  PHQ - 2 Score 0 0 1    Fall Risk Fall Risk  09/18/2016 08/11/2016  Falls in the past year? - Yes  Number falls in past yr: 2 or more 2 or more  Injury with Fall? Yes Yes  Risk Factor Category  High Fall Risk High Fall Risk  Risk for fall due to : History of fall(s);Impaired balance/gait History of fall(s);Impaired mobility  Follow up Falls prevention discussed Falls prevention discussed;Education provided;Falls evaluation completed    Cognitive Function:       Ad8 score reviewed for issues:  Issues making decisions:no  Less interest in hobbies / activities:no  Repeats questions, stories (family complaining):no  Trouble using ordinary gadgets (microwave, computer, phone):no  Forgets the month or year: no  Mismanaging finances: no  Remembering appts:no  Daily problems with thinking and/or memory: no Ad8 score is=0     Screening Tests Health Maintenance  Topic Date Due  . Hepatitis C Screening  December 21, 1950  . ZOSTAVAX  04/24/2009  . DEXA SCAN  04/24/2014  . PNA vac Low Risk Adult (1 of 2 - PCV13) 04/24/2014  . MAMMOGRAM  05/31/2015  . INFLUENZA VACCINE  05/19/2016  . COLONOSCOPY  02/23/2019  . TETANUS/TDAP  02/16/2021   Declines all vaccines. Patient states she is sensitive to "mercury in the storage compound" Mammo and Dexa ordered today.  Patient not fasting, will obtain fasting labs at next [place] (jan 2018).    Plan:     Continue to eat heart healthy diet (full of fruits, vegetables, whole grains, lean protein, water--limit salt,  fat, and sugar intake) and increase physical activity as tolerated.  Continue doing brain stimulating activities (puzzles, reading, adult coloring books, staying active) to keep memory sharp.   Bring a copy of your advance directives to your next office visit.  Schedule mammogram and bone scan.  During the course of the visit, Ura was educated and counseled about the following appropriate screening and preventive services:   Vaccines to include Pneumoccal, Influenza, Hepatitis B, Td, Zostavax, HCV  Cardiovascular disease screening  Colorectal cancer screening  Bone density screening  Diabetes screening  Glaucoma screening  Mammography/PAP  Nutrition counseling  Patient Instructions (the written plan) were given to the patient.    Gerilyn Nestle, RN   09/18/2016     Medical screening examination/treatment/procedure(s) were performed by non-physician practitioner and as supervising physician I was immediately available for consultation/collaboration.  I agree with above assessment and plan.  Electronically Signed by: Howard Pouch, DO [facility]

## 2016-09-21 ENCOUNTER — Ambulatory Visit (INDEPENDENT_AMBULATORY_CARE_PROVIDER_SITE_OTHER): Payer: Medicare Other | Admitting: *Deleted

## 2016-09-21 ENCOUNTER — Encounter (HOSPITAL_COMMUNITY): Payer: Self-pay

## 2016-09-21 DIAGNOSIS — I5030 Unspecified diastolic (congestive) heart failure: Secondary | ICD-10-CM | POA: Diagnosis not present

## 2016-09-21 DIAGNOSIS — Z9889 Other specified postprocedural states: Secondary | ICD-10-CM | POA: Diagnosis not present

## 2016-09-21 DIAGNOSIS — Z5181 Encounter for therapeutic drug level monitoring: Secondary | ICD-10-CM

## 2016-09-21 DIAGNOSIS — Z8679 Personal history of other diseases of the circulatory system: Secondary | ICD-10-CM | POA: Diagnosis not present

## 2016-09-21 DIAGNOSIS — Z952 Presence of prosthetic heart valve: Secondary | ICD-10-CM

## 2016-09-21 LAB — POCT INR: INR: 3.4

## 2016-09-21 MED ORDER — WARFARIN SODIUM 5 MG PO TABS: ORAL_TABLET | ORAL | 0 refills | Status: DC

## 2016-09-22 DIAGNOSIS — I270 Primary pulmonary hypertension: Secondary | ICD-10-CM | POA: Diagnosis not present

## 2016-09-22 DIAGNOSIS — S32029S Unspecified fracture of second lumbar vertebra, sequela: Secondary | ICD-10-CM | POA: Diagnosis not present

## 2016-09-22 DIAGNOSIS — I5030 Unspecified diastolic (congestive) heart failure: Secondary | ICD-10-CM | POA: Diagnosis not present

## 2016-09-22 DIAGNOSIS — M170 Bilateral primary osteoarthritis of knee: Secondary | ICD-10-CM | POA: Diagnosis not present

## 2016-09-22 DIAGNOSIS — G35 Multiple sclerosis: Secondary | ICD-10-CM | POA: Diagnosis not present

## 2016-09-22 DIAGNOSIS — J439 Emphysema, unspecified: Secondary | ICD-10-CM | POA: Diagnosis not present

## 2016-09-24 ENCOUNTER — Other Ambulatory Visit: Payer: Self-pay | Admitting: Family Medicine

## 2016-09-24 DIAGNOSIS — I270 Primary pulmonary hypertension: Secondary | ICD-10-CM | POA: Diagnosis not present

## 2016-09-24 DIAGNOSIS — S32029S Unspecified fracture of second lumbar vertebra, sequela: Secondary | ICD-10-CM | POA: Diagnosis not present

## 2016-09-24 DIAGNOSIS — M170 Bilateral primary osteoarthritis of knee: Secondary | ICD-10-CM | POA: Diagnosis not present

## 2016-09-24 DIAGNOSIS — G35 Multiple sclerosis: Secondary | ICD-10-CM | POA: Diagnosis not present

## 2016-09-24 DIAGNOSIS — J439 Emphysema, unspecified: Secondary | ICD-10-CM | POA: Diagnosis not present

## 2016-09-24 DIAGNOSIS — I5030 Unspecified diastolic (congestive) heart failure: Secondary | ICD-10-CM | POA: Diagnosis not present

## 2016-09-25 ENCOUNTER — Encounter (HOSPITAL_COMMUNITY): Payer: Self-pay

## 2016-09-28 ENCOUNTER — Encounter (HOSPITAL_COMMUNITY): Payer: Self-pay

## 2016-09-29 DIAGNOSIS — I5030 Unspecified diastolic (congestive) heart failure: Secondary | ICD-10-CM | POA: Diagnosis not present

## 2016-09-29 DIAGNOSIS — I270 Primary pulmonary hypertension: Secondary | ICD-10-CM | POA: Diagnosis not present

## 2016-09-29 DIAGNOSIS — S32029S Unspecified fracture of second lumbar vertebra, sequela: Secondary | ICD-10-CM | POA: Diagnosis not present

## 2016-09-29 DIAGNOSIS — G35 Multiple sclerosis: Secondary | ICD-10-CM | POA: Diagnosis not present

## 2016-09-29 DIAGNOSIS — J439 Emphysema, unspecified: Secondary | ICD-10-CM | POA: Diagnosis not present

## 2016-09-29 DIAGNOSIS — M170 Bilateral primary osteoarthritis of knee: Secondary | ICD-10-CM | POA: Diagnosis not present

## 2016-10-01 DIAGNOSIS — J439 Emphysema, unspecified: Secondary | ICD-10-CM | POA: Diagnosis not present

## 2016-10-01 DIAGNOSIS — M170 Bilateral primary osteoarthritis of knee: Secondary | ICD-10-CM | POA: Diagnosis not present

## 2016-10-01 DIAGNOSIS — I5030 Unspecified diastolic (congestive) heart failure: Secondary | ICD-10-CM | POA: Diagnosis not present

## 2016-10-01 DIAGNOSIS — S32029S Unspecified fracture of second lumbar vertebra, sequela: Secondary | ICD-10-CM | POA: Diagnosis not present

## 2016-10-01 DIAGNOSIS — I270 Primary pulmonary hypertension: Secondary | ICD-10-CM | POA: Diagnosis not present

## 2016-10-01 DIAGNOSIS — G35 Multiple sclerosis: Secondary | ICD-10-CM | POA: Diagnosis not present

## 2016-10-02 ENCOUNTER — Encounter (HOSPITAL_COMMUNITY): Payer: Self-pay

## 2016-10-05 ENCOUNTER — Encounter (HOSPITAL_COMMUNITY): Payer: Self-pay

## 2016-10-09 ENCOUNTER — Encounter (HOSPITAL_COMMUNITY): Payer: Self-pay

## 2016-10-16 ENCOUNTER — Encounter (HOSPITAL_COMMUNITY): Payer: Self-pay

## 2016-10-29 ENCOUNTER — Encounter: Payer: Self-pay | Admitting: Family Medicine

## 2016-10-29 ENCOUNTER — Ambulatory Visit (INDEPENDENT_AMBULATORY_CARE_PROVIDER_SITE_OTHER): Payer: Medicare Other | Admitting: Family Medicine

## 2016-10-29 VITALS — BP 133/77 | HR 78 | Temp 97.6°F | Resp 20 | Ht 69.0 in | Wt 130.8 lb

## 2016-10-29 DIAGNOSIS — Z952 Presence of prosthetic heart valve: Secondary | ICD-10-CM

## 2016-10-29 DIAGNOSIS — G35 Multiple sclerosis: Secondary | ICD-10-CM | POA: Diagnosis not present

## 2016-10-29 DIAGNOSIS — I5030 Unspecified diastolic (congestive) heart failure: Secondary | ICD-10-CM | POA: Diagnosis not present

## 2016-10-29 DIAGNOSIS — I480 Paroxysmal atrial fibrillation: Secondary | ICD-10-CM | POA: Diagnosis not present

## 2016-10-29 DIAGNOSIS — Z8679 Personal history of other diseases of the circulatory system: Secondary | ICD-10-CM | POA: Diagnosis not present

## 2016-10-29 DIAGNOSIS — E039 Hypothyroidism, unspecified: Secondary | ICD-10-CM | POA: Diagnosis not present

## 2016-10-29 DIAGNOSIS — Z9889 Other specified postprocedural states: Secondary | ICD-10-CM | POA: Diagnosis not present

## 2016-10-29 DIAGNOSIS — Z13220 Encounter for screening for lipoid disorders: Secondary | ICD-10-CM

## 2016-10-29 LAB — LIPID PANEL
CHOL/HDL RATIO: 4
Cholesterol: 242 mg/dL — ABNORMAL HIGH (ref 0–200)
HDL: 66.6 mg/dL (ref >39.00)
LDL CALC: 145 mg/dL — AB (ref 0–99)
NONHDL: 175.73
Triglycerides: 156 mg/dL — ABNORMAL HIGH (ref 0.0–149.0)
VLDL: 31.2 mg/dL (ref 0.0–40.0)

## 2016-10-29 LAB — CBC WITH DIFFERENTIAL/PLATELET
BASOS ABS: 0.1 10*3/uL (ref 0.0–0.1)
Basophils Relative: 0.7 % (ref 0.0–3.0)
EOS PCT: 1.1 % (ref 0.0–5.0)
Eosinophils Absolute: 0.1 10*3/uL (ref 0.0–0.7)
HEMATOCRIT: 43 % (ref 36.0–46.0)
Hemoglobin: 14.4 g/dL (ref 12.0–15.0)
LYMPHS ABS: 1.3 10*3/uL (ref 0.7–4.0)
LYMPHS PCT: 17.2 % (ref 12.0–46.0)
MCHC: 33.4 g/dL (ref 30.0–36.0)
MCV: 94.2 fl (ref 78.0–100.0)
MONOS PCT: 8.6 % (ref 3.0–12.0)
Monocytes Absolute: 0.7 10*3/uL (ref 0.1–1.0)
Neutro Abs: 5.5 10*3/uL (ref 1.4–7.7)
Neutrophils Relative %: 72.4 % (ref 43.0–77.0)
Platelets: 296 10*3/uL (ref 150.0–400.0)
RBC: 4.57 Mil/uL (ref 3.87–5.11)
RDW: 13.4 % (ref 11.5–15.5)
WBC: 7.6 10*3/uL (ref 4.0–10.5)

## 2016-10-29 LAB — COMPREHENSIVE METABOLIC PANEL
ALBUMIN: 4.3 g/dL (ref 3.5–5.2)
ALK PHOS: 101 U/L (ref 39–117)
ALT: 11 U/L (ref 0–35)
AST: 19 U/L (ref 0–37)
BILIRUBIN TOTAL: 0.5 mg/dL (ref 0.2–1.2)
BUN: 18 mg/dL (ref 6–23)
CALCIUM: 10.4 mg/dL (ref 8.4–10.5)
CO2: 31 mEq/L (ref 19–32)
Chloride: 100 mEq/L (ref 96–112)
Creatinine, Ser: 0.69 mg/dL (ref 0.40–1.20)
GFR: 90.06 mL/min (ref >60.00)
GLUCOSE: 95 mg/dL (ref 70–99)
POTASSIUM: 4.7 meq/L (ref 3.5–5.1)
Sodium: 140 mEq/L (ref 135–145)
TOTAL PROTEIN: 7.1 g/dL (ref 6.0–8.3)

## 2016-10-29 LAB — TSH: TSH: 4.49 u[IU]/mL (ref 0.35–4.50)

## 2016-10-29 NOTE — Patient Instructions (Signed)
We will call you with lab results and follow up plan depending on those results.   I have placed a referral to neurology for you. You will hear from them to schedule.   Please make an appt to follow-up with your cardiologist.   Please help [place] help you:  It is a privilege to be able to take care of great patients such as yourself. We are honored you have chosen [facility] for your Primary Care home. Below you will find basic instructions that you may need to access in the future. Please help [place] help you by reading the instructions, which cover many of the frequent questions we experience.   Prescription refills and request:  -In order to allow more efficient response time, please call your pharmacy for all refills. They will forward the request electronically to [place]. This allows for the quickest possible response. Request left on a nurse line can take longer to refill, since these are checked as time allows between office patients and other phone calls.  - refill request can take up to 3-5 working days to complete.  - If request is sent electronically and request is appropiate, it is usually completed in 1-2 business days.  - all patients will need to be seen routinely for all chronic medical conditions requiring prescription medications (see follow-up below). If you are overdue for follow up on your condition, you will be asked to make an appointment and we will call in enough medication to cover you until your appointment (up to 30 days).  - all controlled substances will require a face to face visit to request/refill.  - if you desire your prescriptions to go through a new pharmacy, and have an active script at original pharmacy, you will need to call your pharmacy and have scripts transferred to new pharmacy. This is completed between the pharmacy locations and not by your provider.    Results: If any images or labs were ordered, it can take up to 1 week to get results depending on the  test ordered and the lab/facility running and resulting the test. - Normal or stable results, which do not need further discussion, will be released to your mychart immediately with attached note to you. A call will not be generated for normal results. Please make certain to sign up for mychart. If you have questions on how to activate your mychart you can call the front office.  - If your results need further discussion, our office will attempt to contact you via phone, and if unable to reach you after 2 attempts, we will release your abnormal result to your mychart with instructions.  - All results will be automatically released in mychart after 1 week.  - Your provider will provide you with explanation and instruction on all relevant material in your results. Please keep in mind, results and labs may appear confusing or abnormal to the untrained eye, but it does not mean they are actually abnormal for you personally. If you have any questions about your results that are not covered, or you desire more detailed explanation than what was provided, you should make an appointment with your provider to do so.   Our office handles many outgoing and incoming calls daily. If we have not contacted you within 1 week about your results, please check your mychart to see if there is a message first and if not, then contact our office.  In helping with this matter, you help decrease call volume, and  therefore allow [place] to be able to respond to patients needs more efficiently.   Acute office visits (sick visit):  An acute visit is intended for a new problem and are scheduled in shorter time slots to allow schedule openings for patients with new problems. This is the appropriate visit to discuss a new problem. In order to provide you with excellent quality medical care with proper time for you to explain your problem, have an exam and receive treatment with instructions, these appointments should be limited to one new  problem per visit. If you experience a new problem, in which you desire to be addressed, please make an acute office visit, we save openings on the schedule to accommodate you. Please do not save your new problem for any other type of visit, let us take care of it properly and quickly for you.   Follow up visits:  Depending on your condition(s) your provider will need to see you routinely in order to provide you with quality care and prescribe medication(s). Most chronic conditions (Example: hypertension, Diabetes, depression/anxiety... etc), require visits a couple times a year. Your provider will instruct you on proper follow up for your personal medical conditions and history. Please make certain to make follow up appointments for your condition as instructed. Failing to do so could result in lapse in your medication treatment/refills. If you request a refill, and are overdue to be seen on a condition, we will always provide you with a 30 day script (once) to allow you time to schedule.    Medicare wellness (well visit): - we have a wonderful Nurse Maudie Mercury), that will meet with you and provide you will yearly medicare wellness visits. These visits should occur yearly (can not be scheduled less than 1 calendar year apart) and cover preventive health, immunizations, advance directives and screenings you are entitled to yearly through your medicare benefits. Do not miss out on your entitled benefits, this is when medicare will pay for these benefits to be ordered for you.  These are strongly encouraged by your provider and is the appropriate type of visit to make certain you are up to date with all preventive health benefits. If you have not had your medicare wellness exam in the last 12 months, please make certain to schedule one by calling the office and schedule your medicare wellness with Maudie Mercury as soon as possible.   Yearly physical (well visit):  - Adults are recommended to be seen yearly for physicals.  Check with your insurance and date of your last physical, most insurances require one calendar year between physicals. Physicals include all preventive health topics, screenings, medical exam and labs that are appropriate for gender/age and history. You may have fasting labs needed at this visit. This is a well visit (not a sick visit), acute topics should not be covered during this visit.  - Pediatric patients are seen more frequently when they are younger. Your provider will advise you on well child visit timing that is appropriate for your their age. - This is not a medicare wellness visit. Medicare wellness exams do not have an exam portion to the visit. Some medicare companies allow for a physical, some do not allow a yearly physical. If your medicare allows a yearly physical you can schedule the medicare wellness with our nurse Maudie Mercury and have your physical with your provider after, on the same day. Please check with insurance for your full benefits.   Late Policy/No Shows:  - all new patients should  arrive 15-30 minutes earlier than appointment to allow [place] time  to  obtain all personal demographics,  insurance information and for you to complete office paperwork. - All established patients should arrive 10-15 minutes earlier than appointment time to update all information and be checked in .  - In our best efforts to run on time, if you are late for your appointment you will be asked to either reschedule or if able, we will work you back into the schedule. There will be a wait time to work you back in the schedule,  depending on availability.  - If you are unable to make it to your appointment as scheduled, please call 24 hours ahead of time to allow [place] to fill the time slot with someone else who needs to be seen. If you do not cancel your appointment ahead of time, you may be charged a no show fee.

## 2016-10-29 NOTE — Progress Notes (Signed)
[name] [name] , [birth year], 66 y.o., female MRN: [id number] Patient Care Team    Relationship Specialty Notifications Start End  Ma Hillock, DO PCP - General Family Medicine  07/31/16   [name] Stain, MD Attending Physician Pulmonary Disease Abnormal results only, Admissions 03/03/12   Wallene Huh, DPM Consulting Physician Podiatry  08/11/16   Dorothy Spark, MD Consulting Physician Cardiology  08/11/16   Mingo Amber, MD  Alternative Medicine  08/11/16   Kevan Ny Ditty, MD Consulting Physician Neurosurgery  08/11/16   Princess Bruins, MD Consulting Physician Obstetrics and Gynecology  09/18/16   Vinnie Level  Dentistry  09/18/16   Druscilla Brownie, MD Consulting Physician Dermatology  09/18/16     CC: Chronic medical conditions Subjective:  Hypothyroid: Patient presents for follow-up on her chronic medical conditions today. She does report increased fatigue. She reports compliance with her Armour Thyroid 60 mg daily. She is on multiple over-the-counter/integrated medicine medications including progesterones.   Multiple sclerosis: Patient had established a few months ago with complaints of fatigue and weakness. She has been performing home health physical therapy a few times a week and feels that she is starting to become stronger. She has had multiple falls in ED visits in the last few years. She is currently not established with a neurologist in the area. Prior records have been reviewed and updated in the system.   Atrial fibrillation: Patient is status post Maze procedure and mitral valve replacement  completed April 2013. She is established with cardiology, Dr. Meda Coffee, and Coumadin clinic for routine PT/INR on warfarin. Patient has a history of diastolic heart failure, pulmonary hypertension that occurred prior to mitral valve replacement. Last echocardiogram 10/23/2015  No significant interval change. Ejection fraction 50-55%. Mechanical mitral valve functioning.  Patient denies chest pain, shortness of breath, dizziness or lower extremity edema.  Echocardiogram 10/23/2015: Study Conclusions - Left ventricle: The cavity size was normal. Wall thickness was   normal. Systolic function was normal. The estimated ejection   fraction was in the range of 50% to 55%. Wall motion was normal;   there were no regional wall motion abnormalities. The study is   not technically sufficient to allow evaluation of LV diastolic   function. - Mitral valve: A mechanical prosthesis was present and functioning   normally. - Right ventricle: The cavity size was mildly dilated. Wall   thickness was normal. Impressions: - Compared to the prior study, there has been no significant   interval change.  Allergies  Allergen Reactions  . Erythromycin Nausea And Vomiting   Social History  Substance Use Topics  . Smoking status: Never Smoker  . Smokeless tobacco: Never Used  . Alcohol use No   Past Medical History:  Diagnosis Date  . Anxiety   . Arthritis    knees  . Breast cancer ([place]) 1999  . CHF (congestive heart failure) ([place])    Related to severe mitral regurgitation, April, 2013  . 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 Assess by pulmonary team in the hospital April, 2013  . Ejection fraction    EF 60%, echo, April, 2013, with severe MR before mitral valve replacement  . Herpes   . Hypothyroidism   . IBS (irritable bowel syndrome)   . Mitral valve regurgitation    Mitral valve replacement April, 2013, Mitral valve prolapse  . Multiple sclerosis ([place])   . Neurogenic bladder   . Osteoporosis   . Ovarian cyst   .  Paroxysmal atrial fibrillation (HCC)    Rapid atrial fibrillation in-hospital, Rapid cardioversion,  before mitral valve surgery  . Pulmonary hypertension    Echo, April, 2013, before mitral valve surgery  . S/P Maze operation for atrial fibrillation 01/26/2012   Complete biatrial lesion set using cryothermy  via right mini thoracotomy  . S/P mitral valve replacement 01/26/2012   87m Sorin Carbomedics Optiform mechanical prosthesis via right mini thoracotomy  . Warfarin anticoagulation    Mechanical mitral prosthesis, April, 20136   Past Surgical History:  Procedure Laterality Date  . BREAST LUMPECTOMY Right 1999   with sent.node, and axillary dissection (20)  . CHEST TUBE INSERTION  01/26/2012   Procedure: CHEST TUBE INSERTION;  Surgeon: CRexene Alberts MD;  [facility]  Service: Open Heart Surgery;  Laterality: Left;  . COLONOSCOPY  2010   "normal"  . CYSTOSCOPY  1992  . LAPAROSCOPIC OVARIAN CYSTECTOMY  1978   urethral stricture repair  . LEFT AND RIGHT HEART CATHETERIZATION WITH CORONARY ANGIOGRAM N/A 01/20/2012   Procedure: LEFT AND RIGHT HEART CATHETERIZATION WITH CORONARY ANGIOGRAM;  Surgeon: CBurnell Blanks MD;  [facility];  Service: Cardiovascular;  Laterality: N/A;  . LYMPHADENECTOMY    . MAZE  01/26/2012   Procedure: MAZE;  Surgeon: CRexene Alberts MD;  [place] [facility]  Service: Open Heart Surgery;  Laterality: N/A;  . MITRAL VALVE REPLACEMENT  01/26/2012   Procedure: MINIMALLY INVASIVE MITRAL VALVE (MV) REPLACEMENT;  Surgeon: CRexene Alberts MD;  [facility] [facility]  Service: Open Heart Surgery;  Laterality: Right;  . TEE WITHOUT CARDIOVERSION  01/19/2012   Procedure: TRANSESOPHAGEAL ECHOCARDIOGRAM (TEE);  Surgeon: Peter M JMartinique MD;  [facility];  Service: Cardiovascular;  Laterality: N/A;  . TONSILLECTOMY  1970  . [place] WRIST SURGERY Right 2012   Family History  Problem Relation Age of Onset  . Heart disease Father     cardiac arrest   . CAD Father   . Hypertension Father   . CAD Mother     5 stents and numerous bypass surgery  . Hypertension Mother   . Hyperlipidemia Mother   . CAD    . Breast cancer Maternal Grandmother   . Breast cancer Maternal Aunt    Allergies as of 10/29/2016      Reactions   Erythromycin  Nausea And Vomiting      Medication List       Accurate as of 10/29/16  9:44 AM. Always use your most recent med list.          Black Cohosh 40 MG Caps Take 1 capsule by mouth daily.   BROMELAIN PO Take 1 capsule by mouth 2 (two) times daily.   Co Q-10 100 MG Caps Take 1 capsule by mouth daily.   Cranberry 500 MG Caps Take 1 capsule by mouth daily.   ENDOMETRIN 100 MG vaginal insert Generic drug:  progesterone Take 75 mg by mouth daily.   GLUCOSAMINE CHOND COMPLEX/MSM PO Strength of dose Glucosamine 15075m/Chodroitron 100056mMSM 500m11mkes one twice daily   L-THEANINE PO Take 1 capsule by mouth daily.   lactose free nutrition Liqd Take 237 mLs by mouth daily.   lidocaine-prilocaine cream Commonly known as:  EMLA Apply 1 application topically as needed (per pt this is for use before blood draws).   Lysine 500 MG Caps Take 1 capsule by mouth daily.   methocarbamol 750 MG tablet Commonly known as:  ROBAXIN take 1 tablet by mouth every 8 hours if needed for muscle spasm   metoprolol tartrate 25 MG tablet Commonly known as:  LOPRESSOR Take 1 tablet (25 mg total) by mouth 2 (two) times daily.   MILK THISTLE PO Take 1 capsule by mouth 2 (two) times daily.   Olive Leaf 500 MG Caps Take 1 capsule by mouth 2 (two) times daily.   OVER THE COUNTER MEDICATION Take 1 capsule by mouth daily.   OVER THE COUNTER MEDICATION Red marine algae - 1 capsules daily   PETADOLEX PO Take by mouth 2 (two) times daily.   PROBIOTIC DAILY PO Take 1 capsule once a day   PROGESTERONE MICRONIZED PO Take 75 mg by mouth daily.   thyroid 60 MG tablet Commonly known as:  ARMOUR Take 60 mg by mouth daily.   traMADol 50 MG tablet Commonly known as:  ULTRAM Take 1 tablet (50 mg total) by mouth every 6 (six) hours as needed.   TURMERIC PO Take by mouth.   URIPLEX 500 MG Tabs Generic drug:  Pumpkin Seed Take 1 tablet by mouth 2 (two) times daily.   vitamin A 10000 UNIT  capsule Take 10,000 Units by mouth daily.   Vitamin D3 2000 units capsule Take 2,000 Units by mouth daily.   vitamin E 400 UNIT capsule Take 400 Units by mouth daily.   warfarin 5 MG tablet Commonly known as:  COUMADIN Take as directed by Coumadin clinic       No results found for this or any previous visit (from the past 24 hour(s)). No results found.   ROS: Negative, with the exception of above mentioned in HPI   Objective:  BP 133/77 (BP Location: Left Arm, Patient Position: Sitting, Cuff Size: Normal)   Pulse 78   Temp 97.6 F (36.4 C)   Resp 20   Ht 5' 9"  (1.753 m)   Wt 130 lb 12 oz (59.3 kg)   SpO2 99%   BMI 19.31 kg/m  Body mass index is 19.31 kg/m. Gen: Afebrile. No acute distress. Nontoxic in appearance, well developed, well nourished. Walking with walker today. HENT: AT. .  MMM Eyes:Pupils Equal Round Reactive to light, Extraocular movements intact,  Conjunctiva without redness, discharge or icterus. Neck/lymp/endocrine: Supple, no lymphadenopathy, no thyromegaly  CV: RRR,  No edema Chest: CTAB, no wheeze or crackles. Good air movement, normal resp effort.  Abd: Soft. NTND. BS  present.   Neuro: Slow, short strides, using walker. PERLA. EOMi. Alert. Oriented x3.  Assessment/Plan: TEPHANIE ESCORCIA is a 66 y.o. female present for OV for  Hypothyroidism, unspecified type - Patient encouraged follow-up with her integrated/holistic medicine doctor for her supplements and hormones. Will test her thyroid levels today and adjust medication if needed. If normal will call in refills for one year. - Comp Met (CMET) - TSH - Lipid panel  Diastolic congestive heart failure, unspecified congestive heart failure chronicity (HCC) Paroxysmal atrial fibrillation (HCC) S/P mitral valve replacement S/P mitral valve replacement - Patient is fasting, we'll complete labs today. She is to continue to follow with cardiology and Coumadin clinic. It appears patient is overdue  for an appointment with her cardiologist, have encouraged her to schedule this. - CBC w/Diff - Lipid panel  Multiple sclerosis ([place]) - Needs to establish with local neurologist to evaluate and recommend any possible treatment plan for her. All prior records are uploaded into the system. - Ambulatory referral to Neurology  Patient to follow yearly for Medicare wellness  with nurse. She is also to follow-up with provider yearly for thyroid disorder.     electronically signed by:  Howard Pouch, DO  [place]

## 2016-10-30 ENCOUNTER — Telehealth: Payer: Self-pay | Admitting: Family Medicine

## 2016-10-30 ENCOUNTER — Encounter: Payer: Self-pay | Admitting: Family Medicine

## 2016-10-30 MED ORDER — THYROID 60 MG PO TABS: 60.0000 mg | ORAL_TABLET | Freq: Every day | ORAL | 3 refills | Status: DC

## 2016-10-30 NOTE — Telephone Encounter (Signed)
Left message on voice mail instructing patient to return call.

## 2016-10-30 NOTE — Telephone Encounter (Signed)
Patient has not scheduled mammo. SW patient, she has [facility] # & will call to schedule.

## 2016-10-30 NOTE — Telephone Encounter (Signed)
Patient notified and verbalized understanding. Patient refuses to take a statin.  She will start Fish oil and dieting with increasing exercise. Dr. Raoul Pitch notified and is aware of patients response.

## 2016-10-30 NOTE — Telephone Encounter (Signed)
noted 

## 2016-10-30 NOTE — Telephone Encounter (Signed)
Please call pt:  Her labs where all normal with the exception of her cholesterol (which she was expecting). Given her history and her family history, she could benefit form starting a statin medication for CV protection. If she is agreeable I will call in medication for her to start.  - Of course diet high in fiber, low saturated fat and continue to increase her exercise she has been working on will also help. - If she declines would at least recommend fish oil supplementation if she can tolerate. - I have refilled her thyroid medication.  - if starting statin will need to see in 3 months with fasting  labs prior or day of (LFT, lipid) - please place future order if she agrees to statin.   Lipid Panel     Component Value Date/Time   CHOL 242 (H) 10/29/2016 1006   TRIG 156.0 (H) 10/29/2016 1006   HDL 66.60 10/29/2016 1006   CHOLHDL 4 10/29/2016 1006   VLDL 31.2 10/29/2016 1006   LDLCALC 145 (H) 10/29/2016 1006

## 2016-11-02 ENCOUNTER — Ambulatory Visit (INDEPENDENT_AMBULATORY_CARE_PROVIDER_SITE_OTHER): Payer: Medicare Other

## 2016-11-02 ENCOUNTER — Telehealth: Payer: Self-pay | Admitting: Family Medicine

## 2016-11-02 DIAGNOSIS — Z9889 Other specified postprocedural states: Secondary | ICD-10-CM

## 2016-11-02 DIAGNOSIS — Z952 Presence of prosthetic heart valve: Secondary | ICD-10-CM

## 2016-11-02 DIAGNOSIS — Z5181 Encounter for therapeutic drug level monitoring: Secondary | ICD-10-CM | POA: Diagnosis not present

## 2016-11-02 DIAGNOSIS — Z8679 Personal history of other diseases of the circulatory system: Secondary | ICD-10-CM

## 2016-11-02 DIAGNOSIS — I5030 Unspecified diastolic (congestive) heart failure: Secondary | ICD-10-CM

## 2016-11-02 LAB — POCT INR: INR: 3

## 2016-11-02 NOTE — Telephone Encounter (Signed)
Patient notified that lab results will be automatically released 3 - 5 days after MD signs off on labs.  Patient verbalized understanding.

## 2016-11-02 NOTE — Telephone Encounter (Signed)
Pt would like her labs released to MyChart please.

## 2016-11-12 ENCOUNTER — Telehealth: Payer: Self-pay | Admitting: *Deleted

## 2016-11-12 DIAGNOSIS — R296 Repeated falls: Secondary | ICD-10-CM

## 2016-11-12 DIAGNOSIS — G35 Multiple sclerosis: Secondary | ICD-10-CM

## 2016-11-12 DIAGNOSIS — Z9181 History of falling: Secondary | ICD-10-CM

## 2016-11-12 DIAGNOSIS — M4856XS Collapsed vertebra, not elsewhere classified, lumbar region, sequela of fracture: Secondary | ICD-10-CM

## 2016-11-12 NOTE — Telephone Encounter (Signed)
Patient called and states she thinks her scoliosis is acting up and she would like some PT for this. Explained to patient I would have to check with Dr Raoul Pitch to see if this a recommended treatment . Advised patient she may have to schedule an appt for evaluation for this . Advised patient I would check with Dr Raoul Pitch and would call her back with information.

## 2016-11-13 NOTE — Telephone Encounter (Signed)
Spoke with patient reviewed information. 

## 2016-11-13 NOTE — Telephone Encounter (Signed)
I have placed an order for pT since we spoke of it at her last appt earlier this month.  Someone should be calling her to set up

## 2016-11-19 ENCOUNTER — Telehealth: Payer: Self-pay | Admitting: Family Medicine

## 2016-11-19 DIAGNOSIS — G35 Multiple sclerosis: Secondary | ICD-10-CM

## 2016-11-19 DIAGNOSIS — M4856XS Collapsed vertebra, not elsewhere classified, lumbar region, sequela of fracture: Secondary | ICD-10-CM

## 2016-11-19 DIAGNOSIS — M4150 Other secondary scoliosis, site unspecified: Secondary | ICD-10-CM

## 2016-11-19 DIAGNOSIS — G35D Multiple sclerosis, unspecified: Secondary | ICD-10-CM

## 2016-11-19 NOTE — Telephone Encounter (Signed)
Spoke with patient let her know Dr Raoul Pitch out of office today . I will call patient back after decision from Dr Raoul Pitch.

## 2016-11-19 NOTE — Telephone Encounter (Signed)
Pt calling stating that she is having back pain due to scoliosis and asking for a referral to a spine doctor for possible back brace, pt states that she is doing PT, but has notice that when standing she is leaning to the right, pt is asking for a call back.

## 2016-11-20 DIAGNOSIS — M419 Scoliosis, unspecified: Secondary | ICD-10-CM | POA: Insufficient documentation

## 2016-11-20 NOTE — Addendum Note (Signed)
Addended by: Howard Pouch A on: 11/20/2016 12:10 PM   Modules accepted: Orders

## 2016-11-20 NOTE — Telephone Encounter (Signed)
Left message with referral information on patient voice mail.

## 2016-11-20 NOTE — Telephone Encounter (Signed)
Referral placed to spine and scoliosis specialist.

## 2016-12-04 ENCOUNTER — Ambulatory Visit: Payer: Medicare Other | Attending: Family Medicine

## 2016-12-04 DIAGNOSIS — M6281 Muscle weakness (generalized): Secondary | ICD-10-CM | POA: Diagnosis not present

## 2016-12-04 DIAGNOSIS — R293 Abnormal posture: Secondary | ICD-10-CM

## 2016-12-04 DIAGNOSIS — R2689 Other abnormalities of gait and mobility: Secondary | ICD-10-CM

## 2016-12-04 NOTE — Therapy (Signed)
[facility] [address] [place], [place], [address] Phone: [phone]   Fax:  [phone]  Physical Therapy Evaluation  Patient Details  Name: [name] [name] MRN: [id number] Date of Birth: 31-Jan-1951 Referring Provider: Dr. Raoul Pitch  Encounter Date: 12/04/2016      PT End of Session - 12/04/16 1608    Visit Number 1   Number of Visits 17   Date for PT Re-Evaluation 02/02/17   Authorization Type G-CODE AND PROGRESS NOTE EVERY 10TH VISIT.    PT Start Time 1453  pt arrived late   PT Stop Time 1536   PT Time Calculation (min) 43 min   Equipment Utilized During Treatment --  min guard to S   Activity Tolerance Patient tolerated treatment well   Behavior During Therapy WFL for tasks assessed/performed      Past Medical History:  Diagnosis Date  . Anxiety   . Arthritis    knees  . Breast cancer ([place]) 1999  . CHF (congestive heart failure) ([place])    Related to severe mitral regurgitation, April, 2013  . 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 Assess by pulmonary team in the hospital April, 2013  . Ejection fraction    EF 60%, echo, April, 2013, with severe MR before mitral valve replacement  . Herpes   . Hypothyroidism   . IBS (irritable bowel syndrome)   . Mitral valve regurgitation    Mitral valve replacement April, 2013, Mitral valve prolapse  . Multiple sclerosis ([place])   . Neurogenic bladder   . Osteoporosis   . Ovarian cyst   . Paroxysmal atrial fibrillation (HCC)    Rapid atrial fibrillation in-hospital, Rapid cardioversion,  before mitral valve surgery  . Pulmonary hypertension    Echo, April, 2013, before mitral valve surgery  . S/P Maze operation for atrial fibrillation 01/26/2012   Complete biatrial lesion set using cryothermy via right mini thoracotomy  . 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 Warfarin anticoagulation    Mechanical mitral prosthesis, April, 20136    Past Surgical History:  Procedure Laterality Date  . BREAST LUMPECTOMY Right 1999   with sent.node, and axillary dissection (20)  . CHEST TUBE INSERTION  01/26/2012   Procedure: CHEST TUBE INSERTION;  Surgeon: Rexene Alberts, MD;  Location: [facility];  Service: Open Heart Surgery;  Laterality: Left;  . COLONOSCOPY  2010   "normal"  . CYSTOSCOPY  1992  . LAPAROSCOPIC OVARIAN CYSTECTOMY  1978   urethral stricture repair  . LEFT AND RIGHT HEART CATHETERIZATION WITH CORONARY ANGIOGRAM N/A 01/20/2012   Procedure: LEFT AND RIGHT HEART CATHETERIZATION WITH CORONARY ANGIOGRAM;  Surgeon: Burnell Blanks, MD;  Location: [facility] CATH LAB;  Service: Cardiovascular;  Laterality: N/A;  . LYMPHADENECTOMY    . MAZE  01/26/2012   Procedure: MAZE;  Surgeon: Rexene Alberts, MD;  Location: [facility];  Service: Open Heart Surgery;  Laterality: N/A;  . MITRAL VALVE REPLACEMENT  01/26/2012   Procedure: MINIMALLY INVASIVE MITRAL VALVE (MV) REPLACEMENT;  Surgeon: Rexene Alberts, MD;  Location: [facility];  Service: Open Heart Surgery;  Laterality: Right;  . TEE WITHOUT CARDIOVERSION  01/19/2012   Procedure: TRANSESOPHAGEAL ECHOCARDIOGRAM (TEE);  Surgeon: Peter M Martinique, MD;  Location: [facility];  Service: Cardiovascular;  Laterality: N/A;  . TONSILLECTOMY  1970  . [place]  . WRIST SURGERY Right  2012    There were no vitals filed for this visit.       Subjective Assessment - 12/04/16 1502    Subjective Pt reported she had 2 falls last fall and had 2 spine compression fx's (L2 and L5). She received HHPT for approx. 8 weeks and the PT told her she has scoliosis and she would like a brace to stop pt was leaning to the R side. Pt has an appt. scheduled to see a spine specialist. Pt has a personal trainer that comes to her house every Wednesday from 1-2pm.  Pt has used the rollator since 07/2016, but did not use any  AD prior to falls.   Pertinent History MS (neurogenic bladder), first degree heart block, hx of R breast CA, a-fib, arthritis (B Knees), CHF, pt is on coumadin, hypothyroidism, scoliosis   Limitations Standing   How long can you stand comfortably? 1-2 minutes max   Patient Stated Goals Stand up straight and walk with very little assistance (with a cane vs. rollator)    Currently in Pain? Yes   Pain Score 2    Pain Location Back   Pain Orientation Right;Lower   Pain Descriptors / Indicators Aching   Pain Type Chronic pain   Pain Onset More than a month ago   Pain Frequency Constant   Aggravating Factors  Bending forward and standing for prolonged periods of time (>1-2 minutes)   Pain Relieving Factors CBD oil and extra strength Tylenol            [facility] PT Assessment - 12/04/16 1517      Assessment   Medical Diagnosis Compression fx's of lumbar spine, multiple falls, MS   Referring Provider Dr. Raoul Pitch   Onset Date/Surgical Date 08/03/17   Hand Dominance Right   Prior Therapy HHPT     Precautions   Precautions Fall   Precaution Comments Pt reports she no longer has compression fx precautions     Restrictions   Weight Bearing Restrictions No     Balance Screen   Has the patient fallen in the past 6 months Yes   How many times? 2   Has the patient had a decrease in activity level because of a fear of falling?  No   Is the patient reluctant to leave their home because of a fear of falling?  No     Home Environment   Living Environment Private residence   Living Arrangements Alone  and cat: Boggy   Available Help at Discharge Family;Friend(s);Neighbor   Type of [place] to enter   [employer] of Steps 3   Entrance Stairs-Rails Can reach both   Home Layout Two level   Alternate Level Stairs-Number of Steps 12   Alternate Level Stairs-Rails Can reach both   [place] - 2 wheels;Walker - 4 wheels;Shower seat;Grab  bars - tub/shower     Prior Function   Level of Independence Independent   Vocation Retired   Leisure Banker   Overall Cognitive Status Within Functional Limits for tasks assessed     Sensation   Additional Comments Pt reports intermittent N/T in B toes     Posture/Postural Control   Posture/Postural Control Postural limitations   Posture Comments Scoliosis (R tx convex and L Lx convex) causes pt to experience R trunk lean     ROM / Strength   AROM / PROM / Strength AROM;Strength  AROM   Overall AROM  Deficits   Overall AROM Comments Decr. spine AROM 2/2 scoliosis, decr. B ankle DF     Strength   Overall Strength Deficits   Overall Strength Comments B UE strength WFL. B hip flex: 3+/5, knee ext: 4/5, knee flex: 4-/5, ankle DF: 2+/5 (can hold resistance but unable to achieve full AROM), seated gross hip abd/add: 4/5. B hip ext not tested but weakness suspected 2/2 gait deviations.      Transfers   Transfers Sit to Stand;Stand to Sit   Sit to Stand 5: Supervision;With upper extremity assist;From chair/3-in-1   Stand to Sit 5: Supervision;With upper extremity assist;To chair/3-in-1     Ambulation/Gait   Ambulation/Gait Yes   Ambulation/Gait Assistance 4: Min guard;5: Supervision   Ambulation/Gait Assistance Details Cues to stay within rollator, as pt's R lateral trunk lean causes pt's feet to be outside rollator. Cues to improve B heel strike.   Ambulation Distance (Feet) 100 Feet   Assistive device Rollator   Gait Pattern Step-through pattern;Decreased stride length;Decreased dorsiflexion - left;Decreased dorsiflexion - right;Shuffle;Lateral trunk lean to right;Trunk rotated posteriorly on right;Trunk flexed  scoliosis   Ambulation Surface Level;Indoor   Gait velocity 1.6ft/sec.  with rollator     Standardized Balance Assessment   Standardized Balance Assessment Timed Up and Go Test     Timed Up and Go Test   TUG Normal TUG   Normal TUG (seconds)  27.3  with rollator                           PT Education - 12/04/16 1607    Education provided Yes   Education Details PT discussed POC, frequency, duration and outcome measure results. PT encouraged pt to improve B heel strike (L>R) during amb.    Person(s) Educated Patient   Methods Explanation   Comprehension Verbalized understanding          PT Short Term Goals - 12/04/16 1613      PT SHORT TERM GOAL #1   Title Pt will be IND in progressed HEP to improve posture, balance, strength, and flexibility. TARGET DATE FOR ALL STGS: 01/01/17   Status New     PT SHORT TERM GOAL #2   Title Pt will improve TUG time with LRAD to </=20 sec. to decr. falls risk.    Status New     PT SHORT TERM GOAL #3   Title Perform BERG and DGI and write goals as indicated.    Status New     PT SHORT TERM GOAL #4   Title Pt will improve gait speed to >/=1.71ft/sec. to decr. falls risk.    Status New     PT SHORT TERM GOAL #5   Title Pt will amb. 300' over even terrain with LRAD at MOD I level to improve functionla mobility.    Status New           PT Long Term Goals - 12/04/16 1615      PT LONG TERM GOAL #1   Title Pt will improve gait speed with LRAD to >/=2.34ft/sec to safely amb. in the community. TARGET DATE FOR ALL LTGS: 01/29/17   Status New     PT LONG TERM GOAL #2   Title Pt will amb. 500' over even/uneven terrain with LRAD at MOD I level to improve functional mobility.    Status New     PT LONG TERM GOAL #3   Title  Pt will amb. 150' with SPC at MOD I level (indoors) in order to safely amb. at home.    Status New     PT LONG TERM GOAL #4   Title Pt will perform TUG with LRAD in </=13.5sec. to decr. falls risk.    Status New               Plan - 12/04/16 1608    Clinical Impression Statement Pt is a pleasant 66y/o female presenting to OPPT neuro s/p falls and Lumbar compression fx's. Pt no longer has precautions (per pt), as fx's occurred in  October of 2017. Pt's PMH significant for the following: MS (neurogenic bladder), first degree heart block, hx of R breast CA, a-fib, arthritis (B Knees), CHF, pt is on coumadin, hypothyroidism, scoliosis. Pt's situation is moderately complex as she lives alone and requires transportation. Pt presented with the following impairments: gait deviations, decr. strength, impaired balance, impaired flexibility (especially in B hamstrings), postural dysfunction, decr. endurance, and intermittent N/T in B toes. Pt's gait speed and TUG time indiate pt is at a high risk for falls.    Rehab Potential Good   Clinical Impairments Affecting Rehab Potential see above.   PT Frequency 2x / week   PT Duration 8 weeks   PT Treatment/Interventions ADLs/Self Care Home Management;Biofeedback;Canalith Repostioning;Electrical Stimulation;Neuromuscular re-education;Balance training;Therapeutic exercise;Therapeutic activities;Manual techniques;Functional mobility training;Stair training;Gait training;DME Instruction;Patient/family education;Orthotic Fit/Training;Vestibular   PT Next Visit Plan Perform BERG and DGI next session and write goals, limited due to time constraints. Progress HHPT HEP.   Consulted and Agree with Plan of Care Patient      Patient will benefit from skilled therapeutic intervention in order to improve the following deficits and impairments:  Abnormal gait, Decreased endurance, Decreased knowledge of use of DME, Decreased balance, Decreased mobility, Decreased range of motion, Postural dysfunction, Impaired flexibility, Pain, Decreased strength  Visit Diagnosis: Other abnormalities of gait and mobility - Plan: PT plan of care cert/re-cert  Muscle weakness (generalized) - Plan: PT plan of care cert/re-cert  Abnormal posture - Plan: PT plan of care cert/re-cert      G-Codes - [id number] [id number]    Functional Assessment Tool Used  Gait speed with rollator: 1.33ft/sec; TUG with rollator: 27.3sec.     Functional Limitation Mobility: Walking and moving around   Mobility: Walking and Moving Around Current Status [phone]) At least 60 percent but less than 80 percent impaired, limited or restricted   Mobility: Walking and Moving Around Goal Status [phone]) At least 20 percent but less than 40 percent impaired, limited or restricted       Problem List Patient Active Problem List   Diagnosis Date Noted  . Scoliosis 11/20/2016  . Long term current use of anticoagulant 09/18/2016  . Compression fracture of lumbar spine, non-traumatic, sequela 08/11/2016  . Multiple falls 08/11/2016  . At high risk for injury related to fall 08/11/2016  . Encounter for therapeutic drug monitoring 11/27/2013  . Hypothyroidism   . CHF (congestive heart failure) ([place])   . Paroxysmal atrial fibrillation (HCC)   . Arthritis   . Neurogenic bladder   . Warfarin anticoagulation   . First degree heart block 01/29/2012  . 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 S/P Maze operation for atrial fibrillation 01/26/2012  . Anxiety 01/18/2012  . Multiple sclerosis ([place]) 01/19/2007    Ishaaq Penna L 12/04/2016, 4:23 PM  [facility] [address] [place], [place], [address] Phone: [phone]   Fax:  [place]  Name: [name] [name] MRN: [id number] Date of Birth: 1951-08-16  Geoffry Paradise, PT,DPT 12/04/16 4:23 PM Phone: [phone] Fax: [phone]

## 2016-12-07 ENCOUNTER — Telehealth: Payer: Self-pay | Admitting: Family Medicine

## 2016-12-07 NOTE — Telephone Encounter (Signed)
Spoke with patient she has an appt with Dr Tomi Likens on 01/11/17.

## 2016-12-07 NOTE — Telephone Encounter (Signed)
Patient would like CMA to know the referral for the Spinal  Scoliosis discussion has not called patient. Please call patient to advise at [phone].   Also, PT is waiting on results from referral also.  Thank you.

## 2016-12-10 ENCOUNTER — Telehealth: Payer: Self-pay

## 2016-12-10 NOTE — Telephone Encounter (Signed)
PT returned patient's call. Pt was wondering if she could start therapy prior to seeing Dr. Tomi Likens. PT confirmed that Dr. Raoul Pitch sent the referral for PT as well as neurology, so pt can begin PT. Pt verbalized understanding.   Geoffry Paradise, PT,DPT 12/10/16 4:22 PM Phone: [phone] Fax: [phone]

## 2016-12-14 ENCOUNTER — Ambulatory Visit (INDEPENDENT_AMBULATORY_CARE_PROVIDER_SITE_OTHER): Payer: Medicare Other | Admitting: *Deleted

## 2016-12-14 DIAGNOSIS — Z8679 Personal history of other diseases of the circulatory system: Secondary | ICD-10-CM

## 2016-12-14 DIAGNOSIS — Z9889 Other specified postprocedural states: Secondary | ICD-10-CM | POA: Diagnosis not present

## 2016-12-14 DIAGNOSIS — Z952 Presence of prosthetic heart valve: Secondary | ICD-10-CM | POA: Diagnosis not present

## 2016-12-14 DIAGNOSIS — I5030 Unspecified diastolic (congestive) heart failure: Secondary | ICD-10-CM

## 2016-12-14 DIAGNOSIS — Z5181 Encounter for therapeutic drug level monitoring: Secondary | ICD-10-CM | POA: Diagnosis not present

## 2016-12-14 LAB — POCT INR: INR: 3.1

## 2016-12-15 ENCOUNTER — Encounter: Payer: Self-pay | Admitting: Physical Therapy

## 2016-12-15 ENCOUNTER — Ambulatory Visit: Payer: Medicare Other | Admitting: Physical Therapy

## 2016-12-15 DIAGNOSIS — R2689 Other abnormalities of gait and mobility: Secondary | ICD-10-CM | POA: Diagnosis not present

## 2016-12-15 DIAGNOSIS — M6281 Muscle weakness (generalized): Secondary | ICD-10-CM | POA: Diagnosis not present

## 2016-12-15 DIAGNOSIS — R293 Abnormal posture: Secondary | ICD-10-CM | POA: Diagnosis not present

## 2016-12-15 NOTE — Patient Instructions (Signed)
Hamstring Stretch, Seated (Strap, Two Chairs)    Sit with one leg (or both legs) extended onto facing chair. Try to sit up straight and keep back supported. Stay at least 53minute up to 20-30 minutes. Repeat __1__ times each leg.  Copyright  VHI. All rights reserved.    Stretching right side. Seated with armrest on your left, lean to the left and reach right arm up and over your head to the left. Goal to hold 60 seconds.        Copyright  VHI. All rights reserved.

## 2016-12-15 NOTE — Therapy (Signed)
[facility] [address] [place], [place], [address] Phone: [phone]   Fax:  [phone]  Physical Therapy Treatment  Patient Details  Name: [name] [name] MRN: [id number] Date of Birth: 1950/12/31 Referring Provider: Dr. Raoul Pitch  Encounter Date: 12/15/2016      PT End of Session - 12/15/16 2215    Visit Number 2   Number of Visits 17   Date for PT Re-Evaluation 02/02/17   Authorization Type G-CODE AND PROGRESS NOTE EVERY 10TH VISIT.    PT Start Time 1525   PT Stop Time 1620   PT Time Calculation (min) 55 min   Equipment Utilized During Treatment --  min guard to S   Activity Tolerance Patient tolerated treatment well   Behavior During Therapy [facility] for tasks assessed/performed      Past Medical History:  Diagnosis Date  . Anxiety   . Arthritis    knees  . Breast cancer ([place]) 1999  . CHF (congestive heart failure) ([place])    Related to severe mitral regurgitation, April, 2013  . 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 Assess by pulmonary team in the hospital April, 2013  . Ejection fraction    EF 60%, echo, April, 2013, with severe MR before mitral valve replacement  . Herpes   . Hypothyroidism   . IBS (irritable bowel syndrome)   . Mitral valve regurgitation    Mitral valve replacement April, 2013, Mitral valve prolapse  . Multiple sclerosis ([place])   . Neurogenic bladder   . Osteoporosis   . Ovarian cyst   . Paroxysmal atrial fibrillation (HCC)    Rapid atrial fibrillation in-hospital, Rapid cardioversion,  before mitral valve surgery  . Pulmonary hypertension    Echo, April, 2013, before mitral valve surgery  . S/P Maze operation for atrial fibrillation 01/26/2012   Complete biatrial lesion set using cryothermy via right mini thoracotomy  . S/P mitral valve replacement 01/26/2012   70mm Sorin [employer] Optiform mechanical prosthesis via right mini thoracotomy  .  Warfarin anticoagulation    Mechanical mitral prosthesis, April, 20136    Past Surgical History:  Procedure Laterality Date  . BREAST LUMPECTOMY Right 1999   with sent.node, and axillary dissection (20)  . CHEST TUBE INSERTION  01/26/2012   Procedure: CHEST TUBE INSERTION;  Surgeon: Rexene Alberts, MD;  Location: [facility];  Service: Open Heart Surgery;  Laterality: Left;  . COLONOSCOPY  2010   "normal"  . CYSTOSCOPY  1992  . LAPAROSCOPIC OVARIAN CYSTECTOMY  1978   urethral stricture repair  . LEFT AND RIGHT HEART CATHETERIZATION WITH CORONARY ANGIOGRAM N/A 01/20/2012   Procedure: LEFT AND RIGHT HEART CATHETERIZATION WITH CORONARY ANGIOGRAM;  Surgeon: Burnell Blanks, MD;  Location: [facility] CATH LAB;  Service: Cardiovascular;  Laterality: N/A;  . LYMPHADENECTOMY    . MAZE  01/26/2012   Procedure: MAZE;  Surgeon: Rexene Alberts, MD;  Location: [facility];  Service: Open Heart Surgery;  Laterality: N/A;  . MITRAL VALVE REPLACEMENT  01/26/2012   Procedure: MINIMALLY INVASIVE MITRAL VALVE (MV) REPLACEMENT;  Surgeon: Rexene Alberts, MD;  Location: [facility];  Service: Open Heart Surgery;  Laterality: Right;  . TEE WITHOUT CARDIOVERSION  01/19/2012   Procedure: TRANSESOPHAGEAL ECHOCARDIOGRAM (TEE);  Surgeon: Peter M Martinique, MD;  Location: [facility] ENDOSCOPY;  Service: Cardiovascular;  Laterality: N/A;  . TONSILLECTOMY  1970  . [place]  . WRIST SURGERY Right 2012  There were no vitals filed for this visit.      Subjective Assessment - 12/15/16 1528    Subjective Has a trainer that she's working with at her house. "Does a good job of not hurting my back." HHPT noticed she has scoliosis (convex to left). She's been trying to sleep on her right side to stretch her spine. but she wakes up with rt hip pain.    Pertinent History MS (neurogenic bladder), first degree heart block, hx of R breast CA, a-fib, arthritis (B Knees), CHF, pt is on coumadin, hypothyroidism, scoliosis   Limitations  Standing   How long can you stand comfortably? 1-2 minutes max   Patient Stated Goals Stand up straight and walk with very little assistance (with a cane vs. rollator)    Currently in Pain? Yes   Pain Score 1    Pain Location Back   Pain Orientation Right;Lower   Pain Descriptors / Indicators Aching   Pain Type Chronic pain   Pain Onset More than a month ago   Pain Frequency Intermittent  goes away when lying down                         [facility] Adult PT Treatment/Exercise - 12/15/16 1559      Transfers   Transfers Sit to Stand;Stand to Sit   Sit to Stand 6: Modified independent (Device/Increase time);5: Supervision;With upper extremity assist;Without upper extremity assist;With armrests   Sit to Stand Details (indicate cue type and reason) uses momentum; modified independent with armrests; supervision without   Stand to Sit 6: Modified independent (Device/Increase time);4: Min guard;With upper extremity assist;Without upper extremity assist;With armrests;To bed;To chair/3-in-1     Ambulation/Gait   Ambulation/Gait Assistance 5: Supervision   Ambulation/Gait Assistance Details vc for proximity to rollator (lowered hand grips to proper height with improvement)   Ambulation Distance (Feet) 100 Feet   Assistive device Rollator   Gait Pattern Step-through pattern;Decreased stride length;Decreased dorsiflexion - left;Decreased dorsiflexion - right;Shuffle;Lateral trunk lean to right;Trunk rotated posteriorly on right;Trunk flexed;Right foot flat;Left foot flat;Poor foot clearance - left;Poor foot clearance - right   Ambulation Surface Level;Indoor     Posture/Postural Control   Posture/Postural Control Postural limitations   Posture Comments Scoliosis (R tx convex and L Lx convex) causes pt to experience R trunk lean     Standardized Balance Assessment   Standardized Balance Assessment Berg Balance Test     Berg Balance Test   Sit to Stand Able to stand  independently  using hands   Standing Unsupported Able to stand 2 minutes with supervision   Sitting with Back Unsupported but Feet Supported on Floor or Stool Able to sit safely and securely 2 minutes   Stand to Sit Controls descent by using hands   Transfers Able to transfer safely, definite need of hands   Standing Unsupported with Eyes Closed Able to stand 10 seconds with supervision   Standing Ubsupported with Feet Together Needs help to attain position and unable to hold for 15 seconds  assist to position, held 7 seconds   From Standing, Reach Forward with Outstretched Arm Can reach forward >5 cm safely (2")   From Standing Position, Pick up Object from Floor Able to pick up shoe, needs supervision   From Standing Position, Turn to Look Behind Over each Shoulder Needs assist to keep from losing balance and falling   Turn 360 Degrees Needs assistance while turning   Standing Unsupported, Alternately  Place Feet on Step/Stool Needs assistance to keep from falling or unable to try   Standing Unsupported, One Foot in Front Able to take small step independently and hold 30 seconds   Standing on One Leg Unable to try or needs assist to prevent fall   Total Score 26     Exercises   Exercises Knee/Hip;Other Exercises   Other Exercises  trunk left lateral flexion stretch x 3 variations     Knee/Hip Exercises: Stretches   Passive Hamstring Stretch Both;1 rep  x 5 minutes while HEP created     ROM hips: rt internal and external rotation WNL                   Lt external rotation WNL; Lt internal rotation ~10 degrees  Hamstring stretch in supine with knee to chest and then extend knee upward with patient lacking 45 degrees of knee extension bil due to hamstring tightness. Held x 20 sec (pt tolerance). See pt instructions re: hamstring stretch pt felt she could do more in seated position           PT Education - 12/15/16 1639    Education provided Yes   Education Details Educated on how long to hold  stretches for benefit and needing to do daily. See HEP initiated.    Person(s) Educated Patient   Methods Explanation;Demonstration;Tactile cues;Verbal cues   Comprehension Verbalized understanding;Returned demonstration;Verbal cues required;Tactile cues required;Need further instruction          PT Short Term Goals - 12/04/16 1613      PT SHORT TERM GOAL #1   Title Pt will be IND in progressed HEP to improve posture, balance, strength, and flexibility. TARGET DATE FOR ALL STGS: 01/01/17   Status New     PT SHORT TERM GOAL #2   Title Pt will improve TUG time with LRAD to </=20 sec. to decr. falls risk.    Status New     PT SHORT TERM GOAL #3   Title Perform BERG and DGI and write goals as indicated.    Status New     PT SHORT TERM GOAL #4   Title Pt will improve gait speed to >/=1.9ft/sec. to decr. falls risk.    Status New     PT SHORT TERM GOAL #5   Title Pt will amb. 300' over even terrain with LRAD at MOD I level to improve functionla mobility.    Status New           PT Long Term Goals - 12/04/16 1615      PT LONG TERM GOAL #1   Title Pt will improve gait speed with LRAD to >/=2.73ft/sec to safely amb. in the community. TARGET DATE FOR ALL LTGS: 01/29/17   Status New     PT LONG TERM GOAL #2   Title Pt will amb. 500' over even/uneven terrain with LRAD at MOD I level to improve functional mobility.    Status New     PT LONG TERM GOAL #3   Title Pt will amb. 150' with SPC at MOD I level (indoors) in order to safely amb. at home.    Status New     PT LONG TERM GOAL #4   Title Pt will perform TUG with LRAD in </=13.5sec. to decr. falls risk.    Status New               Plan - 12/15/16 2215    Clinical Impression Statement Patient  presents for therapy with multiple concerns. Assisted her to prioritize her goals for today's session and focused on further balance assessment and stretches for bil LE's..    Rehab Potential Good   PT Frequency 2x / week    PT Duration 8 weeks   PT Treatment/Interventions ADLs/Self Care Home Management;Biofeedback;Canalith Repostioning;Electrical Stimulation;Neuromuscular re-education;Balance training;Therapeutic exercise;Therapeutic activities;Manual techniques;Functional mobility training;Stair training;Gait training;DME Instruction;Patient/family education;Orthotic Fit/Training;Vestibular   PT Next Visit Plan Progress HEP for strengthening and balance; gait training    Consulted and Agree with Plan of Care Patient      Patient will benefit from skilled therapeutic intervention in order to improve the following deficits and impairments:  Abnormal gait, Decreased endurance, Decreased knowledge of use of DME, Decreased balance, Decreased mobility, Decreased range of motion, Postural dysfunction, Impaired flexibility, Pain, Decreased strength  Visit Diagnosis: Other abnormalities of gait and mobility  Muscle weakness (generalized)     Problem List Patient Active Problem List   Diagnosis Date Noted  . Scoliosis 11/20/2016  . Long term current use of anticoagulant 09/18/2016  . Compression fracture of lumbar spine, non-traumatic, sequela 08/11/2016  . Multiple falls 08/11/2016  . At high risk for injury related to fall 08/11/2016  . Encounter for therapeutic drug monitoring 11/27/2013  . Hypothyroidism   . CHF (congestive heart failure) ([place])   . Paroxysmal atrial fibrillation (HCC)   . Arthritis   . Neurogenic bladder   . Warfarin anticoagulation   . First degree heart block 01/29/2012  . 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 S/P Maze operation for atrial fibrillation 01/26/2012  . Anxiety 01/18/2012  . Multiple sclerosis ([place]) 01/19/2007    Rexanne Mano, PT 12/15/2016, 10:20 PM  [facility] [address] [place], [place], [address] Phone: [phone]   Fax:  [phone]  Name: DELPHINIA SENDRA MRN: [id number] Date of Birth:  1951-09-27

## 2016-12-24 ENCOUNTER — Encounter: Payer: Self-pay | Admitting: Physical Therapy

## 2016-12-24 ENCOUNTER — Ambulatory Visit: Payer: Medicare Other | Attending: Family Medicine | Admitting: Physical Therapy

## 2016-12-24 DIAGNOSIS — R293 Abnormal posture: Secondary | ICD-10-CM | POA: Diagnosis not present

## 2016-12-24 DIAGNOSIS — R2689 Other abnormalities of gait and mobility: Secondary | ICD-10-CM | POA: Insufficient documentation

## 2016-12-24 DIAGNOSIS — M6281 Muscle weakness (generalized): Secondary | ICD-10-CM | POA: Diagnosis not present

## 2016-12-24 NOTE — Patient Instructions (Signed)
   Flatten back by tightening stomach muscles and buttocks. Repeat 10 times per set. Hold for 5 seconds. Do 1 sets per session. Do 1 sessions per day.  http://orth.exer.us/134      Baby-Bridge   Lie back, legs bent. Inhale, do a pelvic tilt and then lift into a "baby bridge" Keeping ribs in, lengthen lower back. Exhale, rolling down along spine from top. Repeat up to 10 times. Do 1 sessions per day.  Copyright  VHI. All rights reserved.        Knee to Chest (Flexion)   Pull knee toward chest. Feel stretch in lower back or buttock area. Breathing deeply, Hold 2  seconds. Repeat with other knee. Repeat 5 each side. Do 1 sessions per day.  http://gt2.exer.us/225   Copyright  VHI. All rights reserved.   Lower Trunk Rotation Stretch   Keeping back flat and feet together, rotate knees to left side. Hold 10 seconds. Repeat 5  times each side per set. Do 1  sets per session. Do 1 sessions per day.  http://orth.exer.us/122   Copyright  VHI. All rights reserved.

## 2016-12-24 NOTE — Therapy (Signed)
[facility] [address] [place], [place], [address] Phone: [phone]   Fax:  [phone]  Physical Therapy Treatment  Patient Details  Name: [name] [name] MRN: [id number] Date of Birth: [birth year] Referring Provider: Dr. Raoul Pitch  Encounter Date: 12/24/2016      PT End of Session - 12/24/16 1627    Visit Number 3   Number of Visits 17   Date for PT Re-Evaluation 02/02/17   Authorization Type G-CODE AND PROGRESS NOTE EVERY 10TH VISIT.    PT Start Time 1535   PT Stop Time 1615   PT Time Calculation (min) 40 min   Equipment Utilized During Treatment --  min guard to S   Activity Tolerance Patient tolerated treatment well   Behavior During Therapy [facility] for tasks assessed/performed      Past Medical History:  Diagnosis Date  . Anxiety   . Arthritis    knees  . Breast cancer ([place]) 1999  . CHF (congestive heart failure) ([place])    Related to severe mitral regurgitation, April, 2013  . 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 Assess by pulmonary team in the hospital April, 2013  . Ejection fraction    EF 60%, echo, April, 2013, with severe MR before mitral valve replacement  . Herpes   . Hypothyroidism   . IBS (irritable bowel syndrome)   . Mitral valve regurgitation    Mitral valve replacement April, 2013, Mitral valve prolapse  . Multiple sclerosis ([place])   . Neurogenic bladder   . Osteoporosis   . Ovarian cyst   . Paroxysmal atrial fibrillation (HCC)    Rapid atrial fibrillation in-hospital, Rapid cardioversion,  before mitral valve surgery  . Pulmonary hypertension    Echo, April, 2013, before mitral valve surgery  . S/P Maze operation for atrial fibrillation 01/26/2012   Complete biatrial lesion set using cryothermy via right mini thoracotomy  . S/P mitral valve replacement 01/26/2012   51mm Sorin [employer] Optiform mechanical prosthesis via right mini thoracotomy  .  Warfarin anticoagulation    Mechanical mitral prosthesis, April, 20136    Past Surgical History:  Procedure Laterality Date  . BREAST LUMPECTOMY Right 1999   with sent.node, and axillary dissection (20)  . CHEST TUBE INSERTION  01/26/2012   Procedure: CHEST TUBE INSERTION;  Surgeon: Rexene Alberts, MD;  Location: [facility];  Service: Open Heart Surgery;  Laterality: Left;  . COLONOSCOPY  2010   "normal"  . CYSTOSCOPY  1992  . LAPAROSCOPIC OVARIAN CYSTECTOMY  1978   urethral stricture repair  . LEFT AND RIGHT HEART CATHETERIZATION WITH CORONARY ANGIOGRAM N/A 01/20/2012   Procedure: LEFT AND RIGHT HEART CATHETERIZATION WITH CORONARY ANGIOGRAM;  Surgeon: Burnell Blanks, MD;  Location: [facility] CATH LAB;  Service: Cardiovascular;  Laterality: N/A;  . LYMPHADENECTOMY    . MAZE  01/26/2012   Procedure: MAZE;  Surgeon: Rexene Alberts, MD;  Location: [facility];  Service: Open Heart Surgery;  Laterality: N/A;  . MITRAL VALVE REPLACEMENT  01/26/2012   Procedure: MINIMALLY INVASIVE MITRAL VALVE (MV) REPLACEMENT;  Surgeon: Rexene Alberts, MD;  Location: [facility];  Service: Open Heart Surgery;  Laterality: Right;  . TEE WITHOUT CARDIOVERSION  01/19/2012   Procedure: TRANSESOPHAGEAL ECHOCARDIOGRAM (TEE);  Surgeon: Peter M Martinique, MD;  Location: [facility] ENDOSCOPY;  Service: Cardiovascular;  Laterality: N/A;  . TONSILLECTOMY  1970  . [place]  . WRIST SURGERY Right 2012  There were no vitals filed for this visit.      Subjective Assessment - 12/24/16 1536    Subjective (P)  Did not get to work with her trainer yesterday (he was ill). Has done her HEP religiously (at least once per day).    Pertinent History (P)  MS (neurogenic bladder), first degree heart block, hx of R breast CA, a-fib, arthritis (B Knees), CHF, pt is on coumadin, hypothyroidism, scoliosis   Limitations (P)  Standing   How long can you stand comfortably? (P)  1-2 minutes max   Patient Stated Goals (P)  Stand up straight and  walk with very little assistance (with a cane vs. rollator)    Pain Onset (P)  More than a month ago                         [facility] Adult PT Treatment/Exercise - 12/24/16 1648      Transfers   Transfers Sit to Stand;Stand to Sit   Sit to Stand 5: Supervision;With upper extremity assist;From chair/3-in-1;From bed   Sit to Stand Details (indicate cue type and reason) vc for safe use of rollator   Stand to Sit 4: Min guard;With upper extremity assist;With armrests;To bed   Number of Reps --  5   Transfer Cueing safety stand to sit with rollator; pt twice tried to "park" her rollator more than 3 ft from chair or bed and then walk unassisted to surface     Ambulation/Gait   Ambulation/Gait Assistance 5: Supervision   Ambulation/Gait Assistance Details Cues to stay within rollator, as pt's R lateral trunk lean causes pt's feet to be outside rollator. Cues to improve B heel strike.   Ambulation Distance (Feet) 100 Feet  100   Assistive device Rollator   Gait Pattern Step-through pattern;Decreased stride length;Decreased dorsiflexion - left;Decreased dorsiflexion - right;Shuffle;Lateral trunk lean to right;Trunk rotated posteriorly on right;Trunk flexed;Right foot flat;Left foot flat;Poor foot clearance - left;Poor foot clearance - right   Ambulation Surface Level;Indoor     Exercises   Exercises Knee/Hip;Lumbar     Lumbar Exercises: Stretches   Single Knee to Chest Stretch 1 rep;20 seconds   Pelvic Tilt --  10 reps, 2 second hold   Piriformis Stretch 1 rep;10 seconds  cross ankle over opp knee      Lumbar Exercises: Supine   Bridge Non-compliant;10 reps   Bridge Limitations pt only able to lift hips 1" and maintain pain @3 -4/10; higher caused significant pain      Knee/Hip Exercises: Stretches   Passive Hamstring Stretch Both;1 rep;60 seconds   Piriformis Stretch Right;Left;1 rep;20 seconds                PT Education - 12/24/16 1625    Education provided  Yes   Education Details new ex's for HEP; safety with rollator   Person(s) Educated Patient   Methods Explanation;Demonstration;Tactile cues;Verbal cues;Handout   Comprehension Verbalized understanding;Returned demonstration;Need further instruction          PT Short Term Goals - 12/04/16 1613      PT SHORT TERM GOAL #1   Title Pt will be IND in progressed HEP to improve posture, balance, strength, and flexibility. TARGET DATE FOR ALL STGS: 01/01/17   Status New     PT SHORT TERM GOAL #2   Title Pt will improve TUG time with LRAD to </=20 sec. to decr. falls risk.    Status New     PT  SHORT TERM GOAL #3   Title Perform BERG and DGI and write goals as indicated.    Status New     PT SHORT TERM GOAL #4   Title Pt will improve gait speed to >/=1.46ft/sec. to decr. falls risk.    Status New     PT SHORT TERM GOAL #5   Title Pt will amb. 300' over even terrain with LRAD at MOD I level to improve functionla mobility.    Status New           PT Long Term Goals - 12/04/16 1615      PT LONG TERM GOAL #1   Title Pt will improve gait speed with LRAD to >/=2.98ft/sec to safely amb. in the community. TARGET DATE FOR ALL LTGS: 01/29/17   Status New     PT LONG TERM GOAL #2   Title Pt will amb. 500' over even/uneven terrain with LRAD at MOD I level to improve functional mobility.    Status New     PT LONG TERM GOAL #3   Title Pt will amb. 150' with SPC at MOD I level (indoors) in order to safely amb. at home.    Status New     PT LONG TERM GOAL #4   Title Pt will perform TUG with LRAD in </=13.5sec. to decr. falls risk.    Status New               Plan - 12/24/16 1629    Clinical Impression Statement Session focused on technique for HEP (now up to 4 exercises/stretches), problem-solving positioning for sleep to reduce Rt hip and back pain, and bed mobility technique to reduce back pain. Back pain is limiting her walking/activity, therefore is important to address  modifications to her routine/mobility. Also addressed safe use of rollator as pt tends to drift outside the back wheels.    Rehab Potential Good   PT Frequency 2x / week   PT Duration 8 weeks   PT Treatment/Interventions ADLs/Self Care Home Management;Biofeedback;Canalith Repostioning;Electrical Stimulation;Neuromuscular re-education;Balance training;Therapeutic exercise;Therapeutic activities;Manual techniques;Functional mobility training;Stair training;Gait training;DME Instruction;Patient/family education;Orthotic Fit/Training;Vestibular   PT Next Visit Plan Focus on gait training/safety with RW; review pelvic tilts, "baby bridges" (advance to higher bridges if can tolerate); hip strengthening   Consulted and Agree with Plan of Care Patient      Patient will benefit from skilled therapeutic intervention in order to improve the following deficits and impairments:  Abnormal gait, Decreased endurance, Decreased knowledge of use of DME, Decreased balance, Decreased mobility, Decreased range of motion, Postural dysfunction, Impaired flexibility, Pain, Decreased strength  Visit Diagnosis: Other abnormalities of gait and mobility  Muscle weakness (generalized)  Abnormal posture     Problem List Patient Active Problem List   Diagnosis Date Noted  . Scoliosis 11/20/2016  . Long term current use of anticoagulant 09/18/2016  . Compression fracture of lumbar spine, non-traumatic, sequela 08/11/2016  . Multiple falls 08/11/2016  . At high risk for injury related to fall 08/11/2016  . Encounter for therapeutic drug monitoring 11/27/2013  . Hypothyroidism   . CHF (congestive heart failure) ([place])   . Paroxysmal atrial fibrillation (HCC)   . Arthritis   . Neurogenic bladder   . Warfarin anticoagulation   . First degree heart block 01/29/2012  . 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 S/P Maze operation for atrial fibrillation 01/26/2012  . Anxiety 01/18/2012  . Multiple sclerosis ([place])  01/19/2007    Rexanne Mano, PT 12/24/2016, 4:58 PM  [facility] [address] [place], [place], [address] Phone: [phone]   Fax:  [phone]  Name: CHELCY BOLDA MRN: [id number] Date of Birth: 1951-05-03

## 2016-12-31 ENCOUNTER — Ambulatory Visit: Payer: Medicare Other | Admitting: Physical Therapy

## 2016-12-31 ENCOUNTER — Encounter: Payer: Self-pay | Admitting: Physical Therapy

## 2016-12-31 DIAGNOSIS — R293 Abnormal posture: Secondary | ICD-10-CM

## 2016-12-31 DIAGNOSIS — R2689 Other abnormalities of gait and mobility: Secondary | ICD-10-CM | POA: Diagnosis not present

## 2016-12-31 DIAGNOSIS — M6281 Muscle weakness (generalized): Secondary | ICD-10-CM

## 2016-12-31 NOTE — Therapy (Signed)
[facility] [address] [place], [place], [address] Phone: [phone]   Fax:  [phone]  Physical Therapy Treatment  Patient Details  Name: [name] [name] MRN: [id number] Date of Birth: [birth year] Referring Provider: Dr. Raoul Pitch  Encounter Date: 12/31/2016      PT End of Session - 12/31/16 1547    Visit Number 4   Number of Visits 17   Date for PT Re-Evaluation 02/02/17   Authorization Type G-CODE AND PROGRESS NOTE EVERY 10TH VISIT.    PT Start Time 1455   PT Stop Time 1533   PT Time Calculation (min) 38 min   Equipment Utilized During Treatment --  min guard to S   Activity Tolerance Patient tolerated treatment well   Behavior During Therapy WFL for tasks assessed/performed      Past Medical History:  Diagnosis Date  . Anxiety   . Arthritis    knees  . Breast cancer ([place]) 1999  . CHF (congestive heart failure) ([place])    Related to severe mitral regurgitation, April, 2013  . 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 Assess by pulmonary team in the hospital April, 2013  . Ejection fraction    EF 60%, echo, April, 2013, with severe MR before mitral valve replacement  . Herpes   . Hypothyroidism   . IBS (irritable bowel syndrome)   . Mitral valve regurgitation    Mitral valve replacement April, 2013, Mitral valve prolapse  . Multiple sclerosis ([place])   . Neurogenic bladder   . Osteoporosis   . Ovarian cyst   . Paroxysmal atrial fibrillation (HCC)    Rapid atrial fibrillation in-hospital, Rapid cardioversion,  before mitral valve surgery  . Pulmonary hypertension    Echo, April, 2013, before mitral valve surgery  . S/P Maze operation for atrial fibrillation 01/26/2012   Complete biatrial lesion set using cryothermy via right mini thoracotomy  . 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  Warfarin anticoagulation    Mechanical mitral prosthesis, April, 20136    Past Surgical History:  Procedure Laterality Date  . BREAST LUMPECTOMY Right 1999   with sent.node, and axillary dissection (20)  . CHEST TUBE INSERTION  01/26/2012   Procedure: CHEST TUBE INSERTION;  Surgeon: CRexene Alberts MD;  [facility]  Service: Open Heart Surgery;  Laterality: Left;  . COLONOSCOPY  2010   "normal"  . CYSTOSCOPY  1992  . LAPAROSCOPIC OVARIAN CYSTECTOMY  1978   urethral stricture repair  . LEFT AND RIGHT HEART CATHETERIZATION WITH CORONARY ANGIOGRAM N/A 01/20/2012   Procedure: LEFT AND RIGHT HEART CATHETERIZATION WITH CORONARY ANGIOGRAM;  Surgeon: CBurnell Blanks MD;  [facility];  Service: Cardiovascular;  Laterality: N/A;  . LYMPHADENECTOMY    . MAZE  01/26/2012   Procedure: MAZE;  Surgeon: CRexene Alberts MD;  [facility]  Service: Open Heart Surgery;  Laterality: N/A;  . MITRAL VALVE REPLACEMENT  01/26/2012   Procedure: MINIMALLY INVASIVE MITRAL VALVE (MV) REPLACEMENT;  Surgeon: CRexene Alberts MD;  [facility]  Service: Open Heart Surgery;  Laterality: Right;  . TEE WITHOUT CARDIOVERSION  01/19/2012   Procedure: TRANSESOPHAGEAL ECHOCARDIOGRAM (TEE);  Surgeon: Peter M JMartinique MD;  [facility];  Service: Cardiovascular;  Laterality: N/A;  . TONSILLECTOMY  1970  . [place] W[place] SURG[place] Right 2012  There were no vitals filed for this visit.      Subjective Assessment - 12/31/16 1459    Subjective States she's learning how to better use her rollator. Asked if there was a strap that goes behind you when you're walking (to help keep her centered in the rollator).   Pertinent History MS (neurogenic bladder), first degree heart block, hx of R breast CA, a-fib, arthritis (B Knees), CHF, pt is on coumadin, hypothyroidism, scoliosis   Limitations Standing   How long can you stand comfortably? 1-2 minutes max   Patient Stated  Goals Stand up straight and walk with very little assistance (with a cane vs. rollator)    Currently in Pain? No/denies   Pain Onset More than a month ago                         [facility] Adult PT Treatment/Exercise - 12/31/16 0001      Transfers   Transfers Sit to Stand;Stand to Sit   Sit to Stand With upper extremity assist;From chair/3-in-1;From bed;4: Min guard   Sit to Stand Details (indicate cue type and reason) vc for safe use of rollator (likes to "park" it aside and walk last feet without support   Stand to Sit 4: Min guard;With upper extremity assist;With armrests;To bed;To chair/3-in-1   Number of Reps Other reps (comment)  5     Ambulation/Gait   Ambulation/Gait Assistance 5: Supervision   Ambulation/Gait Assistance Details vc/facilitation for upright posture; vc for proximity to rollator and staying centered between the wheels (she drifts to left with near collision with lt wheel numerous times during session)   Ambulation Distance (Feet) 220 Feet   Assistive device Rollator   Gait Pattern Step-through pattern;Decreased stride length;Decreased dorsiflexion - left;Decreased dorsiflexion - right;Shuffle;Lateral trunk lean to right;Trunk rotated posteriorly on right;Trunk flexed;Right foot flat;Left foot flat;Poor foot clearance - left;Poor foot clearance - right   Ambulation Surface Level;Indoor   Gait velocity 0.75 ft/sec  38.2 ft/43.69=      Posture/Postural Control   Posture/Postural Control Postural limitations   Postural Limitations Rounded Shoulders;Forward head   Posture Comments scoliosis     Standardized Balance Assessment   Standardized Balance Assessment Timed Up and Go Test     Timed Up and Go Test   TUG Normal TUG   Normal TUG (seconds) 40.69  with rollator     Lumbar Exercises: Prone   Other Prone Lumbar Exercises attempted to achieve prone and pt unable to tolerate turning over due to back pain     Lumbar Exercises: Quadruped    Straight Leg Raise 5 reps  alternating rt/lt                PT Education - 12/31/16 1547    Education provided Yes   Education Details pt is to attempt kneeling into quadruped on her bed; if not too soft, try leg extensions or try crawling forward/backward   Person(s) Educated Patient   Methods Explanation;Demonstration;Verbal cues   Comprehension Verbalized understanding          PT Short Term Goals - 12/31/16 1556      PT SHORT TERM GOAL #1   Title Pt will be IND in progressed HEP to improve posture, balance, strength, and flexibility. TARGET DATE FOR ALL STGS: 01/01/17   Status On-going     PT SHORT TERM GOAL #2   Title Pt will improve TUG time with LRAD to </=20 sec. to decr.  falls risk.    Baseline 3/15  40.69 seconds   Status Not Met     PT SHORT TERM GOAL #3   Title Perform BERG and DGI and write goals as indicated.    Baseline 2/27 BERG 26/56   Status Achieved     PT SHORT TERM GOAL #4   Title Pt will improve gait speed to >/=1.25f/sec. to decr. falls risk.    Status On-going     PT SHORT TERM GOAL #5   Title Pt will amb. 300' over even terrain with LRAD at MOD I level to improve functionla mobility.    Status On-going           PT Long Term Goals - 12/31/16 1600      PT LONG TERM GOAL #1   Title Pt will improve gait speed with LRAD to >/=2.629fsec to safely amb. in the community. TARGET DATE FOR ALL LTGS: 01/29/17  **Revised 12/31/16 with goal of 1.25 ft/sec   Baseline 2/16 1.17 ft/sec; 3/15 0.7556fec   Time 4   Period Weeks   Status Revised     PT LONG TERM GOAL #2   Title Pt will amb. 500' over even/uneven terrain with LRAD at MOD I level to improve functional mobility.    Status New     PT LONG TERM GOAL #3   Title Pt will amb. 150' with SPC at MOD I level (indoors) in order to safely amb. at home.    Status New     PT LONG TERM GOAL #4   Title Pt will perform TUG with LRAD in </=13.5sec. to decr. falls risk. **Revised 3/15--decrease  TUG to <30 seconds to indicate lesser fall risk   Time 4   Period Weeks   Status Revised     PT LONG TERM GOAL #5   Title Patient will demostrate reduced risk of falling reflected by incr Berg Balance Assessment to 33/56.   Time 4   Period Weeks   Status New               Plan - 12/31/16 1549    Clinical Impression Statement Patient arrived 10 minutes late for session. Patient reports she continues to work on increasing her walking and proper/safe use of rollator. Has come to realize she can stay closer to RW if she takes shorter steps (this technique does allow her to stay between the handles and more importantly more centered between the back wheels of rollator to decr tripping hazard.    Rehab Potential Good   PT Frequency 2x / week   PT Duration 8 weeks   PT Treatment/Interventions ADLs/Self Care Home Management;Biofeedback;Canalith Repostioning;Electrical Stimulation;Neuromuscular re-education;Balance training;Therapeutic exercise;Therapeutic activities;Manual techniques;Functional mobility training;Stair training;Gait training;DME Instruction;Patient/family education;Orthotic Fit/Training;Vestibular   PT Next Visit Plan Focus on gait training/safety with RW; review pelvic tilts, "baby bridges" (advance to higher bridges if can tolerate); hip strengthening, work in quaEngineer, maintenance (IT)th Plan of Care Patient      Patient will benefit from skilled therapeutic intervention in order to improve the following deficits and impairments:  Abnormal gait, Decreased endurance, Decreased knowledge of use of DME, Decreased balance, Decreased mobility, Decreased range of motion, Postural dysfunction, Impaired flexibility, Pain, Decreased strength  Visit Diagnosis: Other abnormalities of gait and mobility  Muscle weakness (generalized)  Abnormal posture     Problem List Patient Active Problem List   Diagnosis Date Noted  . Scoliosis 11/20/2016  . Long term current  use of  anticoagulant 09/18/2016  . Compression fracture of lumbar spine, non-traumatic, sequela 08/11/2016  . Multiple falls 08/11/2016  . At high risk for injury related to fall 08/11/2016  . Encounter for therapeutic drug monitoring 11/27/2013  . Hypothyroidism   . CHF (congestive heart failure) ([place])   . Paroxysmal atrial fibrillation (HCC)   . Arthritis   . Neurogenic bladder   . Warfarin anticoagulation   . First degree heart block 01/29/2012  . 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 S/P Maze operation for atrial fibrillation 01/26/2012  . Anxiety 01/18/2012  . Multiple sclerosis ([place]) 01/19/2007    Rexanne Mano, PT 12/31/2016, 4:08 PM  [facility] [address] [place], [place], [address] Phone: [phone]   Fax:  [phone]  Name: [name] [name] MRN: [id number] Date of Birth: [birth year]

## 2017-01-05 ENCOUNTER — Encounter: Payer: Self-pay | Admitting: Physical Therapy

## 2017-01-05 ENCOUNTER — Ambulatory Visit: Payer: Medicare Other | Admitting: Physical Therapy

## 2017-01-05 DIAGNOSIS — R293 Abnormal posture: Secondary | ICD-10-CM | POA: Diagnosis not present

## 2017-01-05 DIAGNOSIS — R2689 Other abnormalities of gait and mobility: Secondary | ICD-10-CM

## 2017-01-05 DIAGNOSIS — M6281 Muscle weakness (generalized): Secondary | ICD-10-CM

## 2017-01-05 NOTE — Therapy (Signed)
[facility] [address] [place], [place], [address] Phone: [phone]   Fax:  [phone]  Physical Therapy Treatment  Patient Details  Name: [name] [name] MRN: [id number] Date of Birth: 09-29-1951 Referring Provider: Dr. Raoul Pitch  Encounter Date: 01/05/2017      PT End of Session - 01/05/17 1607    Visit Number 5   Number of Visits 17   Date for PT Re-Evaluation 02/02/17   Authorization Type G-CODE AND PROGRESS NOTE EVERY 10TH VISIT.    PT Start Time 1410  pt arrived 10 minutes late   PT Stop Time 1445   PT Time Calculation (min) 35 min   Equipment Utilized During Treatment --  min guard to S   Activity Tolerance Patient tolerated treatment well   Behavior During Therapy WFL for tasks assessed/performed      Past Medical History:  Diagnosis Date  . Anxiety   . Arthritis    knees  . Breast cancer ([place]) 1999  . CHF (congestive heart failure) ([place])    Related to severe mitral regurgitation, April, 2013  . 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 Assess by pulmonary team in the hospital April, 2013  . Ejection fraction    EF 60%, echo, April, 2013, with severe MR before mitral valve replacement  . Herpes   . Hypothyroidism   . IBS (irritable bowel syndrome)   . Mitral valve regurgitation    Mitral valve replacement April, 2013, Mitral valve prolapse  . Multiple sclerosis ([place])   . Neurogenic bladder   . Osteoporosis   . Ovarian cyst   . Paroxysmal atrial fibrillation (HCC)    Rapid atrial fibrillation in-hospital, Rapid cardioversion,  before mitral valve surgery  . Pulmonary hypertension    Echo, April, 2013, before mitral valve surgery  . S/P Maze operation for atrial fibrillation 01/26/2012   Complete biatrial lesion set using cryothermy via right mini thoracotomy  . S/P mitral valve replacement 01/26/2012   67m Sorin [employer] Optiform mechanical prosthesis via  right mini thoracotomy  . Warfarin anticoagulation    Mechanical mitral prosthesis, April, 20136    Past Surgical History:  Procedure Laterality Date  . BREAST LUMPECTOMY Right 1999   with sent.node, and axillary dissection (20)  . CHEST TUBE INSERTION  01/26/2012   Procedure: CHEST TUBE INSERTION;  Surgeon: CRexene Alberts MD;  [facility]  Service: Open Heart Surgery;  Laterality: Left;  . COLONOSCOPY  2010   "normal"  . CYSTOSCOPY  1992  . LAPAROSCOPIC OVARIAN CYSTECTOMY  1978   urethral stricture repair  . LEFT AND RIGHT HEART CATHETERIZATION WITH CORONARY ANGIOGRAM N/A 01/20/2012   Procedure: LEFT AND RIGHT HEART CATHETERIZATION WITH CORONARY ANGIOGRAM;  Surgeon: CBurnell Blanks MD;  [facility];  Service: Cardiovascular;  Laterality: N/A;  . LYMPHADENECTOMY    . MAZE  01/26/2012   Procedure: MAZE;  Surgeon: CRexene Alberts MD;  [facility]  Service: Open Heart Surgery;  Laterality: N/A;  . MITRAL VALVE REPLACEMENT  01/26/2012   Procedure: MINIMALLY INVASIVE MITRAL VALVE (MV) REPLACEMENT;  Surgeon: CRexene Alberts MD;  [facility]  Service: Open Heart Surgery;  Laterality: Right;  . TEE WITHOUT CARDIOVERSION  01/19/2012   Procedure: TRANSESOPHAGEAL ECHOCARDIOGRAM (TEE);  Surgeon: Peter M JMartinique MD;  [facility];  Service: Cardiovascular;  Laterality: N/A;  . TONSILLECTOMY  1970  . [place] . WRIST  SURGERY Right 2012    There were no vitals filed for this visit.      Subjective Assessment - 01/05/17 1412    Subjective Reports Rt knee buckling today. Reports a "sharp, ache" in her Rt tibialis anterior vs peroneal nerve when she is sleeping on her left side. Trying to climb into quadruped on her bed did not go well; bed is too tall and "wrenched her back."   Pertinent History MS (neurogenic bladder), first degree heart block, hx of R breast CA, a-fib, arthritis (B Knees), CHF, pt is on coumadin, hypothyroidism,  scoliosis   Limitations Standing   How long can you stand comfortably? 1-2 minutes max   Patient Stated Goals Stand up straight and walk with very little assistance (with a cane vs. rollator)    Pain Score 1    Pain Location Back   Pain Orientation Lower   Pain Descriptors / Indicators Aching   Pain Onset More than a month ago                         [facility] Adult PT Treatment/Exercise - 01/05/17 1557      Bed Mobility   Bed Mobility Rolling Right;Rolling Left;Right Sidelying to Sit;Sit to Sidelying Right   Rolling Right 5: Supervision   Rolling Right Details (indicate cue type and reason) vc for technique; pt reports painful to roll over on her bed at night   Rolling Left 5: Supervision   Rolling Left Details (indicate cue type and reason) vc for technique; pt reports painful to roll over on her bed at night   Right Sidelying to Sit 4: Min assist   Right Sidelying to Sit Details (indicate cue type and reason) pt fearful to attempt technique at home on her "much higher bed"   Sit to Sidelying Right 4: Min assist   Sit to Sidelying Right Details (indicate cue type and reason) assist for legs (pt begins to lift too soon)     Transfers   Transfers Sit to Stand;Stand to Sit   Sit to Stand 5: Supervision;With upper extremity assist;From bed;From chair/3-in-1   Sit to Stand Details (indicate cue type and reason) also from rollator with good, safe technique   Stand to Sit 5: Supervision   Stand to Sit Details to bed, rollator; occasional uncontrolled descent     Ambulation/Gait   Ambulation/Gait Assistance 5: Supervision   Ambulation/Gait Assistance Details vc for upright posture and keeping lt foot from tripping over back lt wheel of rollator; tactile cues for left weight shift   Ambulation Distance (Feet) 110 Feet  110 x 2   Assistive device Rollator   Gait Pattern Step-through pattern;Decreased stride length;Decreased dorsiflexion - left;Decreased dorsiflexion -  right;Shuffle;Lateral trunk lean to right;Trunk flexed;Right foot flat;Left foot flat;Poor foot clearance - left;Poor foot clearance - right   Ambulation Surface Level;Indoor     Lumbar Exercises: Stretches   Pelvic Tilt 5 reps  5 seconds     Lumbar Exercises: Supine   Clam 5 reps   Clam Limitations hooklying; red band; move 1 leg out/in then other leg (alternating)   Bridge 5 reps;5 seconds   Bridge Limitations pt instructed for HEP to incr to 5 reps x 2 sets or 10 reps     Knee/Hip Exercises: Standing   Hip Extension AROM;Stengthening;Both;10 reps   Extension Limitations at counter; vc for technique to avoid using back  PT Education - 01/05/17 1606    Education provided Yes   Education Details rationale for change in bed mobility technique; safe use of rollator   Person(s) Educated Patient   Methods Explanation   Comprehension Verbalized understanding          PT Short Term Goals - 12/31/16 1556      PT SHORT TERM GOAL #1   Title Pt will be IND in progressed HEP to improve posture, balance, strength, and flexibility. TARGET DATE FOR ALL STGS: 01/01/17   Status On-going     PT SHORT TERM GOAL #2   Title Pt will improve TUG time with LRAD to </=20 sec. to decr. falls risk.    Baseline 3/15  40.69 seconds   Status Not Met     PT SHORT TERM GOAL #3   Title Perform BERG and DGI and write goals as indicated.    Baseline 2/27 BERG 26/56   Status Achieved     PT SHORT TERM GOAL #4   Title Pt will improve gait speed to >/=1.62f/sec. to decr. falls risk.    Status On-going     PT SHORT TERM GOAL #5   Title Pt will amb. 300' over even terrain with LRAD at MOD I level to improve functionla mobility.    Status On-going           PT Long Term Goals - 12/31/16 1600      PT LONG TERM GOAL #1   Title Pt will improve gait speed with LRAD to >/=2.687fsec to safely amb. in the community. TARGET DATE FOR ALL LTGS: 01/29/17  **Revised 12/31/16 with goal  of 1.25 ft/sec   Baseline 2/16 1.17 ft/sec; 3/15 0.7567fec   Time 4   Period Weeks   Status Revised     PT LONG TERM GOAL #2   Title Pt will amb. 500' over even/uneven terrain with LRAD at MOD I level to improve functional mobility.    Status New     PT LONG TERM GOAL #3   Title Pt will amb. 150' with SPC at MOD I level (indoors) in order to safely amb. at home.    Status New     PT LONG TERM GOAL #4   Title Pt will perform TUG with LRAD in </=13.5sec. to decr. falls risk. **Revised 3/15--decrease TUG to <30 seconds to indicate lesser fall risk   Time 4   Period Weeks   Status Revised     PT LONG TERM GOAL #5   Title Patient will demostrate reduced risk of falling reflected by incr Berg Balance Assessment to 33/56.   Time 4   Period Weeks   Status New               Plan - 01/05/17 1608    Clinical Impression Statement Pt arrived 10 minutes late for session. Continue to work on hip/trunk strengthening, safety with rollator, and gait. Progressing towards goals.   Rehab Potential Good   PT Frequency 2x / week   PT Duration 8 weeks   PT Treatment/Interventions ADLs/Self Care Home Management;Biofeedback;Canalith Repostioning;Electrical Stimulation;Neuromuscular re-education;Balance training;Therapeutic exercise;Therapeutic activities;Manual techniques;Functional mobility training;Stair training;Gait training;DME Instruction;Patient/family education;Orthotic Fit/Training;Vestibular   PT Next Visit Plan Focus on gait training/safety with rollator; hip strengthening (?standing marching and abdct for HEP--already has hip extension in HEP); try leg press   Consulted and Agree with Plan of Care Patient      Patient will benefit from skilled therapeutic intervention in order to improve  the following deficits and impairments:  Abnormal gait, Decreased endurance, Decreased knowledge of use of DME, Decreased balance, Decreased mobility, Decreased range of motion, Postural dysfunction,  Impaired flexibility, Pain, Decreased strength  Visit Diagnosis: Other abnormalities of gait and mobility  Muscle weakness (generalized)  Abnormal posture     Problem List Patient Active Problem List   Diagnosis Date Noted  . Scoliosis 11/20/2016  . Long term current use of anticoagulant 09/18/2016  . Compression fracture of lumbar spine, non-traumatic, sequela 08/11/2016  . Multiple falls 08/11/2016  . At high risk for injury related to fall 08/11/2016  . Encounter for therapeutic drug monitoring 11/27/2013  . Hypothyroidism   . CHF (congestive heart failure) ([place])   . Paroxysmal atrial fibrillation (HCC)   . Arthritis   . Neurogenic bladder   . Warfarin anticoagulation   . First degree heart block 01/29/2012  . 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 S/P Maze operation for atrial fibrillation 01/26/2012  . Anxiety 01/18/2012  . Multiple sclerosis ([place]) 01/19/2007    Rexanne Mano, PT 01/05/2017, 4:15 PM  [facility] [address] [place], [place], [address] Phone: [phone]   Fax:  [phone]  Name: WILNA PENNIE MRN: [id number] Date of Birth: 05-Jul-1951

## 2017-01-07 ENCOUNTER — Encounter: Payer: Self-pay | Admitting: Physical Therapy

## 2017-01-07 ENCOUNTER — Ambulatory Visit: Payer: Medicare Other | Admitting: Physical Therapy

## 2017-01-07 DIAGNOSIS — R2689 Other abnormalities of gait and mobility: Secondary | ICD-10-CM

## 2017-01-07 DIAGNOSIS — M6281 Muscle weakness (generalized): Secondary | ICD-10-CM

## 2017-01-07 DIAGNOSIS — R293 Abnormal posture: Secondary | ICD-10-CM | POA: Diagnosis not present

## 2017-01-07 NOTE — Therapy (Signed)
[place] [address] [place], [place], [address] Phone: [phone]   Fax:  [phone]  Physical Therapy Treatment  Patient Details  Name: [name] [name] MRN: [id number] Date of Birth: 1951-06-03 Referring Provider: Dr. Raoul Pitch  Encounter Date: 01/07/2017      PT End of Session - 01/07/17 1758    Visit Number 6   Number of Visits 17   Date for PT Re-Evaluation 02/02/17   Authorization Type G-CODE AND PROGRESS NOTE EVERY 10TH VISIT.    PT Start Time 1535   PT Stop Time 1620   PT Time Calculation (min) 45 min   Activity Tolerance Patient tolerated treatment well;Patient limited by fatigue  frequent seated rest breaks due to LE weakness and SOB   Behavior During Therapy [facility] for tasks assessed/performed      Past Medical History:  Diagnosis Date  . Anxiety   . Arthritis    knees  . Breast cancer ([place]) 1999  . CHF (congestive heart failure) ([place])    Related to severe mitral regurgitation, April, 2013  . 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 Assess by pulmonary team in the hospital April, 2013  . Ejection fraction    EF 60%, echo, April, 2013, with severe MR before mitral valve replacement  . Herpes   . Hypothyroidism   . IBS (irritable bowel syndrome)   . Mitral valve regurgitation    Mitral valve replacement April, 2013, Mitral valve prolapse  . Multiple sclerosis ([place])   . Neurogenic bladder   . Osteoporosis   . Ovarian cyst   . Paroxysmal atrial fibrillation (HCC)    Rapid atrial fibrillation in-hospital, Rapid cardioversion,  before mitral valve surgery  . Pulmonary hypertension    Echo, April, 2013, before mitral valve surgery  . S/P Maze operation for atrial fibrillation 01/26/2012   Complete biatrial lesion set using cryothermy via right mini thoracotomy  . S/P mitral valve replacement 01/26/2012   1m Sorin [employer] Optiform mechanical prosthesis via  right mini thoracotomy  . Warfarin anticoagulation    Mechanical mitral prosthesis, April, 20136    Past Surgical History:  Procedure Laterality Date  . BREAST LUMPECTOMY Right 1999   with sent.node, and axillary dissection (20)  . CHEST TUBE INSERTION  01/26/2012   Procedure: CHEST TUBE INSERTION;  Surgeon: CRexene Alberts MD;  [facility] [facility]  Service: Open Heart Surgery;  Laterality: Left;  . COLONOSCOPY  2010   "normal"  . CYSTOSCOPY  1992  . LAPAROSCOPIC OVARIAN CYSTECTOMY  1978   urethral stricture repair  . LEFT AND RIGHT HEART CATHETERIZATION WITH CORONARY ANGIOGRAM N/A 01/20/2012   Procedure: LEFT AND RIGHT HEART CATHETERIZATION WITH CORONARY ANGIOGRAM;  Surgeon: CBurnell Blanks MD;  [facility];  Service: Cardiovascular;  Laterality: N/A;  . LYMPHADENECTOMY    . MAZE  01/26/2012   Procedure: MAZE;  Surgeon: CRexene Alberts MD;  [place] [facility]  Service: Open Heart Surgery;  Laterality: N/A;  . MITRAL VALVE REPLACEMENT  01/26/2012   Procedure: MINIMALLY INVASIVE MITRAL VALVE (MV) REPLACEMENT;  Surgeon: CRexene Alberts MD;  [facility]  Service: Open Heart Surgery;  Laterality: Right;  . TEE WITHOUT CARDIOVERSION  01/19/2012   Procedure: TRANSESOPHAGEAL ECHOCARDIOGRAM (TEE);  Surgeon: Peter M JMartinique MD;  [facility];  Service: Cardiovascular;  Laterality: N/A;  . TONSILLECTOMY  1970  . [place] WRI[place] Right 2012  There were no vitals filed for this visit.      Subjective Assessment - 01/07/17 1535    Subjective Yesterday saw my trainer (at her house). Worked on legs. Walked with him walking backwards in front of her holding his arms and then was able to let go of him for ~100 ft. Some standing rest breaks. Felt she was no more upright than when using rollator.    Pertinent History MS (neurogenic bladder), first degree heart block, hx of R breast CA, a-fib, arthritis (B Knees), CHF, pt is on coumadin,  hypothyroidism, scoliosis   Limitations Standing   How long can you stand comfortably? 1-2 minutes max   Patient Stated Goals Stand up straight and walk with very little assistance (with a cane vs. rollator)    Currently in Pain? No/denies   Pain Onset More than a month ago                         [facility] Adult PT Treatment/Exercise - 01/07/17 1742      Transfers   Transfers Sit to Stand;Stand to Sit   Sit to Stand 6: Modified independent (Device/Increase time)   Sit to Stand Details (indicate cue type and reason) pt demonstrated consistently the safe technique for using rollator (including transfer on/off rollator seat)   Stand to Sit 5: Supervision;With upper extremity assist;To chair/3-in-1;Uncontrolled descent     Ambulation/Gait   Ambulation/Gait Assistance 5: Supervision   Ambulation/Gait Assistance Details vc for upright posture and proximity to RW; cued only once re: foot placement coming close to back wheel   Ambulation Distance (Feet) 110 Feet  50 x2; 110   Assistive device Rollator   Gait Pattern Step-through pattern;Decreased stride length;Shuffle;Lateral trunk lean to right;Trunk flexed;Right foot flat;Left foot flat;Poor foot clearance - left;Poor foot clearance - right   Ambulation Surface Level;Indoor   Stairs Yes   Stairs Assistance 6: Modified independent (Device/Increase time)   Stair Management Technique Two rails;Alternating pattern;Step to pattern;Forwards   Number of Stairs 4   Height of Stairs 6     Posture/Postural Control   Posture/Postural Control Postural limitations   Postural Limitations Rounded Shoulders;Forward head  scoliosis with rt lean     High Level Balance   High Level Balance Activities Side stepping   High Level Balance Comments holding // bar; 4 lengths     Knee/Hip Exercises: Aerobic   Nustep L2 for 5 minutes     Knee/Hip Exercises: Standing   Lateral Step Up Both;1 set;10 reps;Hand Hold: 2;Step Height: 4"   Forward  Step Up Both;1 set;10 reps;Hand Hold: 2;Step Height: 6"                  PT Short Term Goals - 12/31/16 1556      PT SHORT TERM GOAL #1   Title Pt will be IND in progressed HEP to improve posture, balance, strength, and flexibility. TARGET DATE FOR ALL STGS: 01/01/17   Status On-going     PT SHORT TERM GOAL #2   Title Pt will improve TUG time with LRAD to </=20 sec. to decr. falls risk.    Baseline 3/15  40.69 seconds   Status Not Met     PT SHORT TERM GOAL #3   Title Perform BERG and DGI and write goals as indicated.    Baseline 2/27 BERG 26/56   Status Achieved     PT SHORT TERM GOAL #4   Title Pt will improve  gait speed to >/=1.3f/sec. to decr. falls risk.    Status On-going     PT SHORT TERM GOAL #5   Title Pt will amb. 300' over even terrain with LRAD at MOD I level to improve functionla mobility.    Status On-going           PT Long Term Goals - 12/31/16 1600      PT LONG TERM GOAL #1   Title Pt will improve gait speed with LRAD to >/=2.629fsec to safely amb. in the community. TARGET DATE FOR ALL LTGS: 01/29/17  **Revised 12/31/16 with goal of 1.25 ft/sec   Baseline 2/16 1.17 ft/sec; 3/15 0.7564fec   Time 4   Period Weeks   Status Revised     PT LONG TERM GOAL #2   Title Pt will amb. 500' over even/uneven terrain with LRAD at MOD I level to improve functional mobility.    Status New     PT LONG TERM GOAL #3   Title Pt will amb. 150' with SPC at MOD I level (indoors) in order to safely amb. at home.    Status New     PT LONG TERM GOAL #4   Title Pt will perform TUG with LRAD in </=13.5sec. to decr. falls risk. **Revised 3/15--decrease TUG to <30 seconds to indicate lesser fall risk   Time 4   Period Weeks   Status Revised     PT LONG TERM GOAL #5   Title Patient will demostrate reduced risk of falling reflected by incr Berg Balance Assessment to 33/56.   Time 4   Period Weeks   Status New               Plan - 01/07/17 1800     Clinical Impression Statement Pt arrived 5 minutes late. Improved safe use of rollator during transfers. Continue to work on hip/trunk strengthening, balance, and gait. Progressing towards goals.   Rehab Potential Good   PT Frequency 2x / week   PT Duration 8 weeks   PT Treatment/Interventions ADLs/Self Care Home Management;Biofeedback;Canalith Repostioning;Electrical Stimulation;Neuromuscular re-education;Balance training;Therapeutic exercise;Therapeutic activities;Manual techniques;Functional mobility training;Stair training;Gait training;DME Instruction;Patient/family education;Orthotic Fit/Training;Vestibular   PT Next Visit Plan Nu-step (for warm-up works well); gait training with rollator; hip strengthening (take out pelvic tilt from HEP--remind her to do this as part of bridging--add abdct for HEP--already has hip extension in HEP); try leg press   Consulted and Agree with Plan of Care Patient      Patient will benefit from skilled therapeutic intervention in order to improve the following deficits and impairments:  Abnormal gait, Decreased endurance, Decreased knowledge of use of DME, Decreased balance, Decreased mobility, Decreased range of motion, Postural dysfunction, Impaired flexibility, Pain, Decreased strength  Visit Diagnosis: Other abnormalities of gait and mobility  Muscle weakness (generalized)  Abnormal posture     Problem List Patient Active Problem List   Diagnosis Date Noted  . Scoliosis 11/20/2016  . Long term current use of anticoagulant 09/18/2016  . Compression fracture of lumbar spine, non-traumatic, sequela 08/11/2016  . Multiple falls 08/11/2016  . At high risk for injury related to fall 08/11/2016  . Encounter for therapeutic drug monitoring 11/27/2013  . Hypothyroidism   . [place] . Paroxysmal atrial fibrillation (HCC)   . Arthritis   . Neurogenic bladder   . Warfarin anticoagulation   . First degree heart block  01/29/2012  . 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 S/P Maze operation  for atrial fibrillation 01/26/2012  . Anxiety 01/18/2012  . Multiple sclerosis ([place]) 01/19/2007    Rexanne Mano, PT 01/07/2017, 6:08 PM  [place] [address] [place], [place], [address] Phone: [phone]   Fax:  [phone]  Name: [name] [name] MRN: [id number] Date of Birth: 1951/01/16

## 2017-01-11 ENCOUNTER — Ambulatory Visit (INDEPENDENT_AMBULATORY_CARE_PROVIDER_SITE_OTHER): Payer: Medicare Other | Admitting: Neurology

## 2017-01-11 VITALS — BP 128/78 | HR 71 | Ht 69.0 in | Wt 130.3 lb

## 2017-01-11 DIAGNOSIS — G35 Multiple sclerosis: Secondary | ICD-10-CM

## 2017-01-11 NOTE — Patient Instructions (Signed)
1.  We will check MRI of brain and cervical spine.  Follow up afterwards to discuss treatment options 2.  Will get labs from your other doctor

## 2017-01-11 NOTE — Progress Notes (Signed)
NEUROLOGY CONSULTATION NOTE  MIKAELLA ESCALONA MRN: [id number] DOB: [birth year]  Referring provider: Dr. Raoul Pitch Primary care provider: Dr. Raoul Pitch  Reason for consult:  MS  HISTORY OF PRESENT ILLNESS: [name] [name] is a 66 year old right-handed female with COPD, CHF, mitral valve replacement, hypothyroidism, who presents to establish care regarding multiple sclerosis.  She was diagnosed with multiple sclerosis in 1994.  At the time, she exhibited tingling in her hands as well as frequent falls.  She saw a neurologist who performed an MRI of the brain that revealed white matter lesions.  She underwent a lumbar puncture which reportedly demonstrated possible elevated IgG index.  It was recommended to start Avonex, but she declined, fearing potential side effects.  She has never been on disease modifying therapy.  She has not had any clear MS flares.  She has chronic symptoms, such as falls, neurogenic bladder and occasional tingling in the left hand.  She used to have muscle spasms in the right arm many years ago, which has resolved.  Over the past several months, she has started to use a rolling walker due to increased gait difficulty which she attributes to her MS-related chronic gait instability, as well as arthritis and scoliosis.  She currently is undergoing physical therapy and sees a Physiological scientist once a week.  She has a urologist.  She has never had optic neuritis or focal/unilateral weakness.   MRI of brain without contrast was performed on 07/11/14 revealed supratentorial white matter lesions consistent with demyelinating disease as well as diffuse cortical atrophy.  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  She has sustained L2 and L5 compression fractures with height loss in the past due to her falls.  10/29/16 Labs:  CBC with WBC 7.6, HGB  14.4, HCT 43, PLT 296 and ALC 1.3; CMP with Na 140, K 4.7, Cl 100, CO2 31, glucose 95, BUN 18, Cr 0.69, total bili 0.5, ALP 101, AST 19 and ALT 11.  PAST MEDICAL HISTORY: Past Medical History:  Diagnosis Date  . Anxiety   . Arthritis    knees  . Breast cancer ([place]) 1999  . CHF (congestive heart failure) ([place])    Related to severe mitral regurgitation, April, 2013  . 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 Assess by pulmonary team in the hospital April, 2013  . Ejection fraction    EF 60%, echo, April, 2013, with severe MR before mitral valve replacement  . Herpes   . Hypothyroidism   . IBS (irritable bowel syndrome)   . Mitral valve regurgitation    Mitral valve replacement April, 2013, Mitral valve prolapse  . Multiple sclerosis ([place])   . Neurogenic bladder   . Osteoporosis   . Ovarian cyst   . Paroxysmal atrial fibrillation (HCC)    Rapid atrial fibrillation in-hospital, Rapid cardioversion,  before mitral valve surgery  . Pulmonary hypertension    Echo, April, 2013, before mitral valve surgery  . S/P Maze operation for atrial fibrillation 01/26/2012   Complete biatrial lesion set using cryothermy via right mini thoracotomy  . 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 Warfarin anticoagulation    Mechanical mitral prosthesis, April, 20136    PAST SURGICAL HISTORY: Past Surgical History:  Procedure Laterality Date  . BREAST LUMPECTOMY Right 1999  with sent.node, and axillary dissection (20)  . CHEST TUBE INSERTION  01/26/2012   Procedure: CHEST TUBE INSERTION;  Surgeon: Rexene Alberts, MD;  Location: [place];  Service: Open Heart Surgery;  Laterality: Left;  . COLONOSCOPY  2010   "normal"  . CYSTOSCOPY  1992  . LAPAROSCOPIC OVARIAN CYSTECTOMY  1978   urethral stricture repair  . LEFT AND RIGHT HEART CATHETERIZATION WITH CORONARY ANGIOGRAM N/A 01/20/2012    Procedure: LEFT AND RIGHT HEART CATHETERIZATION WITH CORONARY ANGIOGRAM;  Surgeon: Burnell Blanks, MD;  Location: [facility] CATH LAB;  Service: Cardiovascular;  Laterality: N/A;  . LYMPHADENECTOMY    . MAZE  01/26/2012   Procedure: MAZE;  Surgeon: Rexene Alberts, MD;  Location: [place];  Service: Open Heart Surgery;  Laterality: N/A;  . MITRAL VALVE REPLACEMENT  01/26/2012   Procedure: MINIMALLY INVASIVE MITRAL VALVE (MV) REPLACEMENT;  Surgeon: Rexene Alberts, MD;  Location: [place];  Service: Open Heart Surgery;  Laterality: Right;  . TEE WITHOUT CARDIOVERSION  01/19/2012   Procedure: TRANSESOPHAGEAL ECHOCARDIOGRAM (TEE);  Surgeon: Peter M Martinique, MD;  Location: [facility] ENDOSCOPY;  Service: Cardiovascular;  Laterality: N/A;  . TONSILLECTOMY  1970  . [place]  . WRIST SURGERY Right 2012    MEDICATIONS: Current Outpatient Prescriptions on File Prior to Visit  Medication Sig Dispense Refill  . Black Cohosh 40 MG CAPS Take 1 capsule by mouth daily.    . Bromelains (BROMELAIN PO) Take 1 capsule by mouth 2 (two) times daily.    . Cholecalciferol (VITAMIN D3) 2000 units capsule Take 2,000 Units by mouth daily.    . Coenzyme Q10 (CO Q-10) 100 MG CAPS Take 1 capsule by mouth daily.     . Cranberry 500 MG CAPS Take 1 capsule by mouth daily.    Marland Kitchen L-THEANINE PO Take 1 capsule by mouth daily.     Marland Kitchen lactose free nutrition (BOOST PLUS) LIQD Take 237 mLs by mouth daily.     Marland Kitchen lidocaine-prilocaine (EMLA) cream Apply 1 application topically as needed (per pt this is for use before blood draws).   0  . Lysine 500 MG CAPS Take 1 capsule by mouth daily.    . methocarbamol (ROBAXIN) 750 MG tablet take 1 tablet by mouth every 8 hours if needed for muscle spasm 30 tablet 0  . metoprolol tartrate (LOPRESSOR) 25 MG tablet Take 1 tablet (25 mg total) by mouth 2 (two) times daily. 180 tablet 3  . MILK THISTLE PO Take 1 capsule by mouth 2 (two) times daily.    . Misc Natural Products (GLUCOSAMINE CHOND  COMPLEX/MSM PO) Strength of dose Glucosamine 1500mg  /Chodroitron 1000mg / MSM 500mg  takes one twice daily    . Olive Leaf 500 MG CAPS Take 1 capsule by mouth 2 (two) times daily.     Marland Kitchen OVER THE COUNTER MEDICATION Red marine algae - 1 capsules daily    . OVER THE COUNTER MEDICATION Take 1 capsule by mouth daily.    . Petasin (PETADOLEX PO) Take by mouth 2 (two) times daily.    . Probiotic Product (PROBIOTIC DAILY PO) Take 1 capsule once a day    . progesterone (ENDOMETRIN) 100 MG vaginal insert Take 75 mg by mouth daily.    Marland Kitchen PROGESTERONE MICRONIZED PO Take 75 mg by mouth daily.     . Pumpkin Seed (URIPLEX) 500 MG TABS Take 1 tablet by mouth 2 (two) times daily.    Marland Kitchen thyroid (ARMOUR) 60 MG tablet Take  1 tablet (60 mg total) by mouth daily. 90 tablet 3  . traMADol (ULTRAM) 50 MG tablet Take 1 tablet (50 mg total) by mouth every 6 (six) hours as needed. 15 tablet 0  . TURMERIC PO Take by mouth.    . vitamin A 10000 UNIT capsule Take 10,000 Units by mouth daily.    . vitamin E 400 UNIT capsule Take 400 Units by mouth daily.    Marland Kitchen warfarin (COUMADIN) 5 MG tablet Take as directed by Coumadin clinic 90 tablet 0   No current facility-administered medications on file prior to visit.     ALLERGIES: Allergies  Allergen Reactions  . Erythromycin Nausea And Vomiting    FAMILY HISTORY: Family History  Problem Relation Age of Onset  . Heart disease Father     cardiac arrest   . CAD Father   . Hypertension Father   . CAD Mother     5 stents and numerous bypass surgery  . Hypertension Mother   . Hyperlipidemia Mother   . CAD    . Breast cancer Maternal Grandmother   . Breast cancer Maternal Aunt     SOCIAL HISTORY: Social History   Social History  . Marital status: Widowed    Spouse name: N/A  . Number of children: 2  . Years of education: 16   Occupational History  . Retired    Social History Main Topics  . Smoking status: Never Smoker  . Smokeless tobacco: Never Used  . Alcohol  use No  . Drug use: No  . Sexual activity: No   Other Topics Concern  . Not on file   Social History Narrative   Patient is a widower (since 1), he currently lives with her son and daughter-in-law. She has 2 children.   She is college educated, and retired from the [employer].   She uses herbal remedies, and multiple over-the-counter supplements. She takes a daily vitamin.   She wears her seatbelt, exercises routinely, smoke detector in the home.   Requires a walker or wheelchair at times.   Feels safe in her relationships.    REVIEW OF SYSTEMS: Constitutional: No fevers, chills, or sweats, no generalized fatigue, change in appetite Eyes: No visual changes, double vision, eye pain Ear, nose and throat: No hearing loss, ear pain, nasal congestion, sore throat Cardiovascular: No chest pain, palpitations Respiratory:  No shortness of breath at rest or with exertion, wheezes GastrointestinaI: No nausea, vomiting, diarrhea, abdominal pain, fecal incontinence Genitourinary:  No dysuria, urinary retention or frequency Musculoskeletal:  No neck pain, back pain Integumentary: No rash, pruritus, skin lesions Neurological: as above Psychiatric: No depression, insomnia, anxiety Endocrine: No palpitations, fatigue, diaphoresis, mood swings, change in appetite, change in weight, increased thirst Hematologic/Lymphatic:  No purpura, petechiae. Allergic/Immunologic: no itchy/runny eyes, nasal congestion, recent allergic reactions, rashes  PHYSICAL EXAM: Vitals:   01/11/17 1501  BP: 128/78  Pulse: 71   General: No acute distress.   Head:  Normocephalic/atraumatic Eyes:  fundi examined but not visualized Neck: supple, no paraspinal tenderness, full range of motion Back: No paraspinal tenderness.  Scoliosis with convexity right thoracic region. Heart: regular rate and rhythm Lungs: Clear to auscultation bilaterally. Vascular: No carotid bruits. Neurological Exam: Mental status:  alert and oriented to person, place, and time, recent and remote memory intact, fund of knowledge intact, attention and concentration intact, speech fluent and not dysarthric, language intact. Cranial nerves: CN I: not tested CN II: pupils equal, round and reactive to light,  visual fields intact CN III, IV, VI:  full range of motion, no nystagmus, no ptosis CN V: facial sensation intact CN VII: upper and lower face symmetric CN VIII: hearing intact CN IX, X: gag intact, uvula midline CN XI: sternocleidomastoid and trapezius muscles intact CN XII: tongue midline Bulk & Tone: normal, no fasciculations. Motor:  4+/5 bilateral hip flexion, 5-/5 knee extension/flexion, 5/5 upper extremities Sensation: temperature and vibration sensation intact. Deep Tendon Reflexes:  2+ throughout, bilateral Babinski Finger to nose testing:  Without dysmetria.  Heel to shin:  Without dysmetria.  Gait:  Wide based gait with torso side bent to the right.  Unable to ambulate without assistance.  Romberg positive  IMPRESSION: Multiple sclerosis.  Based on disease progression, she may have primary progressive form.  History is not suggestive of relapsing-remitting form.  PLAN: 1.  Will get MRI of brain and cervical spine.  Due to her metal mitral valve, there are limitations for her to get an MRI, so we will order them without contrast. 2.  Continue D3 6000 IU daily.  Will obtain labs from her doctor. 3.  Continue PT 4.  Follow up after MRIs to discuss treatment options.  Thank you for allowing me to take part in the care of this patient.  Metta Clines, DO  CC:  Howard Pouch, DO

## 2017-01-12 ENCOUNTER — Encounter: Payer: Self-pay | Admitting: Neurology

## 2017-01-12 ENCOUNTER — Ambulatory Visit: Payer: Medicare Other | Admitting: Physical Therapy

## 2017-01-12 ENCOUNTER — Encounter: Payer: Self-pay | Admitting: Physical Therapy

## 2017-01-12 DIAGNOSIS — R293 Abnormal posture: Secondary | ICD-10-CM

## 2017-01-12 DIAGNOSIS — R2689 Other abnormalities of gait and mobility: Secondary | ICD-10-CM

## 2017-01-12 DIAGNOSIS — M6281 Muscle weakness (generalized): Secondary | ICD-10-CM | POA: Diagnosis not present

## 2017-01-12 NOTE — Therapy (Signed)
[facility] [address] [place], [place], [address] Phone: [phone]   Fax:  [phone]  Physical Therapy Treatment  Patient Details  Name: [name] [name] MRN: [id number] Date of Birth: [birth year] Referring Provider: Dr. Raoul Pitch  Encounter Date: 01/12/2017      PT End of Session - 01/12/17 1737    Visit Number 7   Number of Visits 17   Date for PT Re-Evaluation 02/02/17   Authorization Type G-CODE AND PROGRESS NOTE EVERY 10TH VISIT.    PT Start Time 1417   PT Stop Time 1445   PT Time Calculation (min) 28 min   Activity Tolerance Patient tolerated treatment well;Patient limited by fatigue  frequent seated rest breaks due to LE weakness and SOB   Behavior During Therapy [facility] for tasks assessed/performed      Past Medical History:  Diagnosis Date  . Anxiety   . Arthritis    knees  . Breast cancer ([place]) 1999  . CHF (congestive heart failure) ([place])    Related to severe mitral regurgitation, April, 2013  . 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 Assess by pulmonary team in the hospital April, 2013  . Ejection fraction    EF 60%, echo, April, 2013, with severe MR before mitral valve replacement  . Herpes   . Hypothyroidism   . IBS (irritable bowel syndrome)   . Mitral valve regurgitation    Mitral valve replacement April, 2013, Mitral valve prolapse  . Multiple sclerosis ([place])   . Neurogenic bladder   . Osteoporosis   . Ovarian cyst   . Paroxysmal atrial fibrillation (HCC)    Rapid atrial fibrillation in-hospital, Rapid cardioversion,  before mitral valve surgery  . Pulmonary hypertension    Echo, April, 2013, before mitral valve surgery  . S/P Maze operation for atrial fibrillation 01/26/2012   Complete biatrial lesion set using cryothermy via right mini thoracotomy  . S/P mitral valve replacement 01/26/2012   70m Sorin [employer] Optiform mechanical prosthesis via  right mini thoracotomy  . Warfarin anticoagulation    Mechanical mitral prosthesis, April, 20136    Past Surgical History:  Procedure Laterality Date  . BREAST LUMPECTOMY Right 1999   with sent.node, and axillary dissection (20)  . CHEST TUBE INSERTION  01/26/2012   Procedure: CHEST TUBE INSERTION;  Surgeon: CRexene Alberts MD;  [facility]  Service: Open Heart Surgery;  Laterality: Left;  . COLONOSCOPY  2010   "normal"  . CYSTOSCOPY  1992  . LAPAROSCOPIC OVARIAN CYSTECTOMY  1978   urethral stricture repair  . LEFT AND RIGHT HEART CATHETERIZATION WITH CORONARY ANGIOGRAM N/A 01/20/2012   Procedure: LEFT AND RIGHT HEART CATHETERIZATION WITH CORONARY ANGIOGRAM;  Surgeon: CBurnell Blanks MD;  [facility];  Service: Cardiovascular;  Laterality: N/A;  . LYMPHADENECTOMY    . MAZE  01/26/2012   Procedure: MAZE;  Surgeon: CRexene Alberts MD;  [place] [facility]  Service: Open Heart Surgery;  Laterality: N/A;  . MITRAL VALVE REPLACEMENT  01/26/2012   Procedure: MINIMALLY INVASIVE MITRAL VALVE (MV) REPLACEMENT;  Surgeon: CRexene Alberts MD;  [place]  Service: Open Heart Surgery;  Laterality: Right;  . TEE WITHOUT CARDIOVERSION  01/19/2012   Procedure: TRANSESOPHAGEAL ECHOCARDIOGRAM (TEE);  Surgeon: Peter M JMartinique MD;  [facility];  Service: Cardiovascular;  Laterality: N/A;  . TONSILLECTOMY  1970  . [place] . WRIST SURGERY Right 2012  There were no vitals filed for this visit.      Subjective Assessment - 01/12/17 1418    Subjective Reports saw Dr. Tomi Likens and he ordered MRI of head and neck. He thinks it may be a progressive form of MS. 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 (From dental fillings she had done long ago; she has since had them removed). Feeling stronger in general.   Pertinent History MS (neurogenic bladder), first degree heart block, hx of R breast CA, a-fib, arthritis (B Knees), CHF,  pt is on coumadin, hypothyroidism, scoliosis   Limitations Standing   How long can you stand comfortably? 1-2 minutes max   Patient Stated Goals Stand up straight and walk with very little assistance (with a cane vs. rollator)    Currently in Pain? No/denies   Pain Onset More than a month ago                         [facility] Adult PT Treatment/Exercise - 01/12/17 1444      Transfers   Transfers Sit to Stand;Stand to Sit   Sit to Stand 6: Modified independent (Device/Increase time)   Stand to Sit 5: Supervision;With upper extremity assist;With armrests;Uncontrolled descent   Transfer Cueing ~25% of stand to sit were uncontrolled "flops"     Ambulation/Gait   Ambulation/Gait Assistance 5: Supervision   Ambulation/Gait Assistance Details vc for upright posture and proximity to RW; cued only once re: foot placement coming close to back wheel   Ambulation Distance (Feet) 110 Feet  25, 15, 100   Assistive device Rollator   Gait Pattern Step-through pattern;Decreased stride length;Shuffle;Lateral trunk lean to right;Trunk flexed;Right foot flat;Left foot flat;Poor foot clearance - left;Poor foot clearance - right   Ambulation Surface Level;Indoor     Exercises   Exercises Ankle     Knee/Hip Exercises: Aerobic   Nustep L2 for 5 minutes     Knee/Hip Exercises: Standing   Hip Abduction AROM;Stengthening;Both;1 set;10 reps   Hip Extension AROM;Stengthening;Both;1 set;10 reps   Other Standing Knee Exercises sideways walking x 4 length // bars; backwards x 2 lengths     Ankle Exercises: Standing   Heel Raises 10 reps   Toe Raise 10 reps                PT Education - 01/12/17 1737    Education provided Yes   Education Details add standing hip abdct to HEP   Person(s) Educated Patient   Methods Explanation;Demonstration;Tactile cues   Comprehension Verbalized understanding;Returned demonstration;Need further instruction          PT Short Term Goals -  12/31/16 1556      PT SHORT TERM GOAL #1   Title Pt will be IND in progressed HEP to improve posture, balance, strength, and flexibility. TARGET DATE FOR ALL STGS: 01/01/17   Status On-going     PT SHORT TERM GOAL #2   Title Pt will improve TUG time with LRAD to </=20 sec. to decr. falls risk.    Baseline 3/15  40.69 seconds   Status Not Met     PT SHORT TERM GOAL #3   Title Perform BERG and DGI and write goals as indicated.    Baseline 2/27 BERG 26/56   Status Achieved     PT SHORT TERM GOAL #4   Title Pt will improve gait speed to >/=1.20f/sec. to decr. falls risk.    Status  On-going     PT SHORT TERM GOAL #5   Title Pt will amb. 300' over even terrain with LRAD at MOD I level to improve functionla mobility.    Status On-going           PT Long Term Goals - 12/31/16 1600      PT LONG TERM GOAL #1   Title Pt will improve gait speed with LRAD to >/=2.38f/sec to safely amb. in the community. TARGET DATE FOR ALL LTGS: 01/29/17  **Revised 12/31/16 with goal of 1.25 ft/sec   Baseline 2/16 1.17 ft/sec; 3/15 0.758fsec   Time 4   Period Weeks   Status Revised     PT LONG TERM GOAL #2   Title Pt will amb. 500' over even/uneven terrain with LRAD at MOD I level to improve functional mobility.    Status New     PT LONG TERM GOAL #3   Title Pt will amb. 150' with SPC at MOD I level (indoors) in order to safely amb. at home.    Status New     PT LONG TERM GOAL #4   Title Pt will perform TUG with LRAD in </=13.5sec. to decr. falls risk. **Revised 3/15--decrease TUG to <30 seconds to indicate lesser fall risk   Time 4   Period Weeks   Status Revised     PT LONG TERM GOAL #5   Title Patient will demostrate reduced risk of falling reflected by incr Berg Balance Assessment to 33/56.   Time 4   Period Weeks   Status New               Plan - 01/12/17 1740    Clinical Impression Statement Patient arrived 12 minutes late and then used restroom, beginning session 17  minutes late. Session focused on LE strengthening, gait, and balance activities. Continue to progress towards goals.   Rehab Potential Good   PT Frequency 2x / week   PT Duration 8 weeks   PT Treatment/Interventions ADLs/Self Care Home Management;Biofeedback;Canalith Repostioning;Electrical Stimulation;Neuromuscular re-education;Balance training;Therapeutic exercise;Therapeutic activities;Manual techniques;Functional mobility training;Stair training;Gait training;DME Instruction;Patient/family education;Orthotic Fit/Training;Vestibular   PT Next Visit Plan gait training with rollator; hip strengthening (take out pelvic tilt from HEP--remind her to do this as part of bridging); try leg press; likes Nu-step   Consulted and Agree with Plan of Care Patient      Patient will benefit from skilled therapeutic intervention in order to improve the following deficits and impairments:  Abnormal gait, Decreased endurance, Decreased knowledge of use of DME, Decreased balance, Decreased mobility, Decreased range of motion, Postural dysfunction, Impaired flexibility, Pain, Decreased strength  Visit Diagnosis: Other abnormalities of gait and mobility  Muscle weakness (generalized)  Abnormal posture     Problem List Patient Active Problem List   Diagnosis Date Noted  . Scoliosis 11/20/2016  . Long term current use of anticoagulant 09/18/2016  . Compression fracture of lumbar spine, non-traumatic, sequela 08/11/2016  . Multiple falls 08/11/2016  . At high risk for injury related to fall 08/11/2016  . Encounter for therapeutic drug monitoring 11/27/2013  . Hypothyroidism   . CHF (congestive heart failure) ([facility]  . Paroxysmal atrial fibrillation (HCC)   . Arthritis   . Neurogenic bladder   . Warfarin anticoagulation   . First degree heart block 01/29/2012  . 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 S/P Maze operation for atrial fibrillation 01/26/2012  . Anxiety 01/18/2012  . Multiple  sclerosis ([place]/11/2006    LyJeanie Cooks  Emonie Espericueta, PT 01/12/2017, 5:44 PM  [facility] [address] [place], [place], [address] Phone: [phone]   Fax:  [phone]  Name: WANETTA FUNDERBURKE MRN: [id number] Date of Birth: 1951/08/13

## 2017-01-14 ENCOUNTER — Encounter: Payer: Self-pay | Admitting: Physical Therapy

## 2017-01-14 ENCOUNTER — Ambulatory Visit: Payer: Medicare Other | Admitting: Physical Therapy

## 2017-01-14 DIAGNOSIS — M6281 Muscle weakness (generalized): Secondary | ICD-10-CM

## 2017-01-14 DIAGNOSIS — R293 Abnormal posture: Secondary | ICD-10-CM

## 2017-01-14 DIAGNOSIS — R2689 Other abnormalities of gait and mobility: Secondary | ICD-10-CM

## 2017-01-14 NOTE — Therapy (Signed)
[facility] [address] [place], [place], [address] Phone: [phone]   Fax:  [phone]  Physical Therapy Treatment  Patient Details  Name: [name] [name] MRN: [id number] Date of Birth: [birth year] Referring Provider: Dr. Raoul Pitch  Encounter Date: 01/14/2017      PT End of Session - 01/14/17 1557    Visit Number 8   Number of Visits 17   Date for PT Re-Evaluation 02/02/17   Authorization Type G-CODE AND PROGRESS NOTE EVERY 10TH VISIT.    PT Start Time 1455  pt arrived late & used restroom   PT Stop Time 1530   PT Time Calculation (min) 35 min   Activity Tolerance Patient tolerated treatment well;Patient limited by fatigue  frequent seated rest breaks due to LE weakness and SOB   Behavior During Therapy [facility] for tasks assessed/performed      Past Medical History:  Diagnosis Date  . Anxiety   . Arthritis    knees  . Breast cancer ([place]) 1999  . CHF (congestive heart failure) ([place])    Related to severe mitral regurgitation, April, 2013  . 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 Assess by pulmonary team in the hospital April, 2013  . Ejection fraction    EF 60%, echo, April, 2013, with severe MR before mitral valve replacement  . Herpes   . Hypothyroidism   . IBS (irritable bowel syndrome)   . Mitral valve regurgitation    Mitral valve replacement April, 2013, Mitral valve prolapse  . Multiple sclerosis ([place])   . Neurogenic bladder   . Osteoporosis   . Ovarian cyst   . Paroxysmal atrial fibrillation (HCC)    Rapid atrial fibrillation in-hospital, Rapid cardioversion,  before mitral valve surgery  . Pulmonary hypertension    Echo, April, 2013, before mitral valve surgery  . S/P Maze operation for atrial fibrillation 01/26/2012   Complete biatrial lesion set using cryothermy via right mini thoracotomy  . S/P mitral valve replacement 01/26/2012   72m Sorin [employer]  Optiform mechanical prosthesis via right mini thoracotomy  . Warfarin anticoagulation    Mechanical mitral prosthesis, April, 20136    Past Surgical History:  Procedure Laterality Date  . BREAST LUMPECTOMY Right 1999   with sent.node, and axillary dissection (20)  . CHEST TUBE INSERTION  01/26/2012   Procedure: CHEST TUBE INSERTION;  Surgeon: CRexene Alberts MD;  [place] [facility]  Service: Open Heart Surgery;  Laterality: Left;  . COLONOSCOPY  2010   "normal"  . CYSTOSCOPY  1992  . LAPAROSCOPIC OVARIAN CYSTECTOMY  1978   urethral stricture repair  . LEFT AND RIGHT HEART CATHETERIZATION WITH CORONARY ANGIOGRAM N/A 01/20/2012   Procedure: LEFT AND RIGHT HEART CATHETERIZATION WITH CORONARY ANGIOGRAM;  Surgeon: CBurnell Blanks MD;  [facility];  Service: Cardiovascular;  Laterality: N/A;  . LYMPHADENECTOMY    . MAZE  01/26/2012   Procedure: MAZE;  Surgeon: CRexene Alberts MD;  [facility]  Service: Open Heart Surgery;  Laterality: N/A;  . MITRAL VALVE REPLACEMENT  01/26/2012   Procedure: MINIMALLY INVASIVE MITRAL VALVE (MV) REPLACEMENT;  Surgeon: CRexene Alberts MD;  [place] [facility]  Service: Open Heart Surgery;  Laterality: Right;  . TEE WITHOUT CARDIOVERSION  01/19/2012   Procedure: TRANSESOPHAGEAL ECHOCARDIOGRAM (TEE);  Surgeon: Peter M JMartinique MD;  [facility];  Service: Cardiovascular;  Laterality: N/A;  . [place]ONSIL[place]OMY  1970  . [place] .  WRIST SURGERY Right 2012    There were no vitals filed for this visit.      Subjective Assessment - 01/14/17 1454    Subjective Reports no pain. Has never hit her left foot against left rear wheel of RW.    Pertinent History MS (neurogenic bladder), first degree heart block, hx of R breast CA, a-fib, arthritis (B Knees), CHF, pt is on coumadin, hypothyroidism, scoliosis   Limitations Standing   How long can you stand comfortably? 1-2 minutes max   Patient Stated Goals Stand up straight and  walk with very little assistance (with a cane vs. rollator)    Currently in Pain? No/denies   Pain Onset More than a month ago                         [facility] Adult PT Treatment/Exercise - 01/14/17 1515      Transfers   Sit to Stand 6: Modified independent (Device/Increase time)   Stand to Sit 6: Modified independent (Device/Increase time);With upper extremity assist   Transfer Cueing better control of descent stand to sit; minimal cues to better align herself with rollator as she pivots to sit or turn and change direction     Ambulation/Gait   Ambulation/Gait Assistance 5: Supervision   Ambulation/Gait Assistance Details pt reports she had to stop walking due to RUE fatigue (indicates Rt deltoid to biceps area). Likely due to incr effort she puts into staying away from left wheel of rollator (pulling on hand grip. With vc to remain closer to rollator, she is more upright and able to relax her UEs. She feels she walks too slow with steps too short, however no discernible decr in velocity.    Ambulation Distance (Feet) 290 Feet   Assistive device Rollator   Gait Pattern Step-through pattern;Decreased stride length;Shuffle;Lateral trunk lean to right;Trunk flexed;Right foot flat;Left foot flat;Poor foot clearance - left;Poor foot clearance - right   Ambulation Surface Level;Indoor     Posture/Postural Control   Posture/Postural Control Postural limitations   Postural Limitations Rounded Shoulders;Forward head  scoliosis with lean to right     Exercises   Other Exercises  seated lateral stretch of torso rt/lt (arms clasped overhead); chin tucks x 5 with 5 sec hold with max vrbal and visual cues     Knee/Hip Exercises: Machines for Strengthening   Total Gym Leg Press 40# bil LEs 10 reps x 3 sets                PT Education - 01/14/17 1556    Education provided Yes   Education Details chin tuck exercise; discontinue pelvic tilts as now doing bridging; lying on  left side for passive stretch of trunk (pt reports sleeps mostly on her left side)   Person(s) Educated Patient   Methods Explanation;Demonstration;Verbal cues   Comprehension Verbalized understanding;Returned demonstration          PT Short Term Goals - 12/31/16 1556      PT SHORT TERM GOAL #1   Title Pt will be IND in progressed HEP to improve posture, balance, strength, and flexibility. TARGET DATE FOR ALL STGS: 01/01/17   Status On-going     PT SHORT TERM GOAL #2   Title Pt will improve TUG time with LRAD to </=20 sec. to decr. falls risk.    Baseline 3/15  40.69 seconds   Status Not Met     PT SHORT TERM GOAL #3   Title Perform  BERG and DGI and write goals as indicated.    Baseline 2/27 BERG 26/56   Status Achieved     PT SHORT TERM GOAL #4   Title Pt will improve gait speed to >/=1.61f/sec. to decr. falls risk.    Status On-going     PT SHORT TERM GOAL #5   Title Pt will amb. 300' over even terrain with LRAD at MOD I level to improve functionla mobility.    Status On-going           PT Long Term Goals - 12/31/16 1600      PT LONG TERM GOAL #1   Title Pt will improve gait speed with LRAD to >/=2.671fsec to safely amb. in the community. TARGET DATE FOR ALL LTGS: 01/29/17  **Revised 12/31/16 with goal of 1.25 ft/sec   Baseline 2/16 1.17 ft/sec; 3/15 0.7510fec   Time 4   Period Weeks   Status Revised     PT LONG TERM GOAL #2   Title Pt will amb. 500' over even/uneven terrain with LRAD at MOD I level to improve functional mobility.    Status New     PT LONG TERM GOAL #3   Title Pt will amb. 150' with SPC at MOD I level (indoors) in order to safely amb. at home.    Status New     PT LONG TERM GOAL #4   Title Pt will perform TUG with LRAD in </=13.5sec. to decr. falls risk. **Revised 3/15--decrease TUG to <30 seconds to indicate lesser fall risk   Time 4   Period Weeks   Status Revised     PT LONG TERM GOAL #5   Title Patient will demostrate reduced risk of  falling reflected by incr Berg Balance Assessment to 33/56.   Time 4   Period Weeks   Status New               Plan - 01/14/17 1558    Clinical Impression Statement Patient again arrived late. Session focused on gait training (safety with RW, improved posture to allow decreased energy expenditure and increased distance), strengthening and ROM of torso. Patient reports independence with HEP and can verbalize all exercises she is currently assigned.    Rehab Potential Good   PT Frequency 2x / week   PT Duration 8 weeks   PT Treatment/Interventions ADLs/Self Care Home Management;Biofeedback;Canalith Repostioning;Electrical Stimulation;Neuromuscular re-education;Balance training;Therapeutic exercise;Therapeutic activities;Manual techniques;Functional mobility training;Stair training;Gait training;DME Instruction;Patient/family education;Orthotic Fit/Training;Vestibular   PT Next Visit Plan gait training/safety with rollator; leg press vs Nu-step; ? try SPC (if pt still using at home)--if not interested, d/c LTG for SPC; try SLS, tandem; postural corrections with standing mirror   Consulted and Agree with Plan of Care Patient      Patient will benefit from skilled therapeutic intervention in order to improve the following deficits and impairments:  Abnormal gait, Decreased endurance, Decreased knowledge of use of DME, Decreased balance, Decreased mobility, Decreased range of motion, Postural dysfunction, Impaired flexibility, Pain, Decreased strength  Visit Diagnosis: Other abnormalities of gait and mobility  Muscle weakness (generalized)  Abnormal posture     Problem List Patient Active Problem List   Diagnosis Date Noted  . Scoliosis 11/20/2016  . Long term current use of anticoagulant 09/18/2016  . Compression fracture of lumbar spine, non-traumatic, sequela 08/11/2016  . Multiple falls 08/11/2016  . At high risk for injury related to fall 08/11/2016  . Encounter for  therapeutic drug monitoring 11/27/2013  . Hypothyroidism   .  CHF (congestive heart failure) ([place])   . Paroxysmal atrial fibrillation (HCC)   . Arthritis   . Neurogenic bladder   . Warfarin anticoagulation   . First degree heart block 01/29/2012  . 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 S/P Maze operation for atrial fibrillation 01/26/2012  . Anxiety 01/18/2012  . Multiple sclerosis ([place]) 01/19/2007    Rexanne Mano, PT 01/14/2017, 4:08 PM  [facility] [address] [place], [place], [address] Phone: [phone]   Fax:  [phone]  Name: [name] [name] MRN: [id number] Date of Birth: 1951/01/30

## 2017-01-18 ENCOUNTER — Other Ambulatory Visit: Payer: Self-pay | Admitting: Cardiology

## 2017-01-18 ENCOUNTER — Telehealth: Payer: Self-pay | Admitting: *Deleted

## 2017-01-18 MED ORDER — WARFARIN SODIUM 5 MG PO TABS: ORAL_TABLET | ORAL | 3 refills | Status: DC

## 2017-01-18 NOTE — Telephone Encounter (Signed)
Returned call to pt advised Warfarin refill has been sent to pharmacy as requested.  See duplicate telephone message in [facility] refill sent to [facility] on [address].

## 2017-01-18 NOTE — Telephone Encounter (Signed)
Warfarin refill sent to pharmacy per pt request.

## 2017-01-18 NOTE — Telephone Encounter (Signed)
New message    Pt is asking to be called back about her coumadin.    *STAT* If patient is at the pharmacy, call can be transferred to refill team.   1. Which medications need to be refilled? (please list name of each medication and dose if known) coumadin  2. Which pharmacy/location (including street and city if local pharmacy) is medication to be sent to? [facility] [place] [phone]  3. Do they need a 30 day or 90 day supply? [place]

## 2017-01-19 ENCOUNTER — Ambulatory Visit: Payer: Medicare Other | Attending: Family Medicine | Admitting: Physical Therapy

## 2017-01-19 ENCOUNTER — Encounter: Payer: Self-pay | Admitting: Physical Therapy

## 2017-01-19 DIAGNOSIS — M6281 Muscle weakness (generalized): Secondary | ICD-10-CM | POA: Diagnosis not present

## 2017-01-19 DIAGNOSIS — R293 Abnormal posture: Secondary | ICD-10-CM | POA: Diagnosis not present

## 2017-01-19 DIAGNOSIS — R2689 Other abnormalities of gait and mobility: Secondary | ICD-10-CM | POA: Insufficient documentation

## 2017-01-19 NOTE — Therapy (Signed)
[facility] [address] [place], [place], [address] Phone: [phone]   Fax:  [phone]  Physical Therapy Treatment  Patient Details  Name: [name] [name] MRN: [id number] Date of Birth: 1951/06/02 Referring Provider: Dr. Raoul Pitch  Encounter Date: 01/19/2017      PT End of Session - 01/19/17 2041    Visit Number 9   Number of Visits 17   Date for PT Re-Evaluation 02/02/17   Authorization Type G-CODE AND PROGRESS NOTE EVERY 10TH VISIT.    PT Start Time 1410  arrived late   PT Stop Time 1446   PT Time Calculation (min) 36 min   Activity Tolerance Patient tolerated treatment well;Patient limited by fatigue  frequent seated rest breaks due to LE weakness and SOB   Behavior During Therapy [facility] for tasks assessed/performed      Past Medical History:  Diagnosis Date  . Anxiety   . Arthritis    knees  . Breast cancer ([place]) 1999  . CHF (congestive heart failure) ([place])    Related to severe mitral regurgitation, April, 2013  . 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 Assess by pulmonary team in the hospital April, 2013  . Ejection fraction    EF 60%, echo, April, 2013, with severe MR before mitral valve replacement  . Herpes   . Hypothyroidism   . IBS (irritable bowel syndrome)   . Mitral valve regurgitation    Mitral valve replacement April, 2013, Mitral valve prolapse  . Multiple sclerosis ([place])   . Neurogenic bladder   . Osteoporosis   . Ovarian cyst   . Paroxysmal atrial fibrillation (HCC)    Rapid atrial fibrillation in-hospital, Rapid cardioversion,  before mitral valve surgery  . Pulmonary hypertension    Echo, April, 2013, before mitral valve surgery  . S/P Maze operation for atrial fibrillation 01/26/2012   Complete biatrial lesion set using cryothermy via right mini thoracotomy  . S/P mitral valve replacement 01/26/2012   86m Sorin [employer] Optiform mechanical  prosthesis via right mini thoracotomy  . Warfarin anticoagulation    Mechanical mitral prosthesis, April, 20136    Past Surgical History:  Procedure Laterality Date  . BREAST LUMPECTOMY Right 1999   with sent.node, and axillary dissection (20)  . CHEST TUBE INSERTION  01/26/2012   Procedure: CHEST TUBE INSERTION;  Surgeon: CRexene Alberts MD;  [facility]  Service: Open Heart Surgery;  Laterality: Left;  . COLONOSCOPY  2010   "normal"  . CYSTOSCOPY  1992  . LAPAROSCOPIC OVARIAN CYSTECTOMY  1978   urethral stricture repair  . LEFT AND RIGHT HEART CATHETERIZATION WITH CORONARY ANGIOGRAM N/A 01/20/2012   Procedure: LEFT AND RIGHT HEART CATHETERIZATION WITH CORONARY ANGIOGRAM;  Surgeon: CBurnell Blanks MD;  [facility];  Service: Cardiovascular;  Laterality: N/A;  . LYMPHADENECTOMY    . MAZE  01/26/2012   Procedure: MAZE;  Surgeon: CRexene Alberts MD;  [facility]  Service: Open Heart Surgery;  Laterality: N/A;  . MITRAL VALVE REPLACEMENT  01/26/2012   Procedure: MINIMALLY INVASIVE MITRAL VALVE (MV) REPLACEMENT;  Surgeon: CRexene Alberts MD;  [facility]  Service: Open Heart Surgery;  Laterality: Right;  . TEE WITHOUT CARDIOVERSION  01/19/2012   Procedure: TRANSESOPHAGEAL ECHOCARDIOGRAM (TEE);  Surgeon: Peter M JMartinique MD;  [facility];  Service: Cardiovascular;  Laterality: N/A;  . TONSILLECTOMY  1970  . [place] . WRIST SURGERY  Right 2012    There were no vitals filed for this visit.      Subjective Assessment - 01/19/17 1417    Subjective Aug 11th is her 50th [employer] reunion. Hopes she can drive herself. Has stopped taking her extra strength tylenol with so far no incr pain. Reports she currently is not attempting to use her SPC at home (may want to try some time in the future). She does use her "grey walker" (RW) at home.   Pertinent History MS (neurogenic bladder), first degree heart block, hx of R breast CA, a-fib,  arthritis (B Knees), CHF, pt is on coumadin, hypothyroidism, scoliosis   Limitations Standing   How long can you stand comfortably? 1-2 minutes max   Patient Stated Goals Stand up straight and walk with very little assistance (with a cane vs. rollator)    Currently in Pain? Yes   Pain Score 3    Pain Location Back   Pain Orientation Lower   Pain Descriptors / Indicators Aching   Pain Type Chronic pain   Pain Onset More than a month ago                         [facility] Adult PT Treatment/Exercise - 01/19/17 1419      Transfers   Sit to Stand 6: Modified independent (Device/Increase time)   Stand to Sit 6: Modified independent (Device/Increase time);With upper extremity assist   Number of Reps 1 set  5 reps, plus additional during session     Ambulation/Gait   Ambulation/Gait Assistance 5: Supervision   Ambulation/Gait Assistance Details Patient reports she has noticed that she walks more smoothly with her 2 wheel RW (she uses at home); she demonstrated while using rollator with more upright posture, incr step length, relaxed UEs, and improved velocity.   Ambulation Distance (Feet) 80 Feet  40, 120   Assistive device Rollator   Gait Pattern Step-through pattern;Decreased stride length;Lateral trunk lean to right;Trunk flexed;Right foot flat;Left foot flat;Poor foot clearance - left;Poor foot clearance - right   Ambulation Surface Level;Indoor     Posture/Postural Control   Posture/Postural Control Postural limitations   Postural Limitations Rounded Shoulders;Forward head  scoliosis with upper body leaning rt and lower trunk to Left   Posture Comments stood with counter on her lt and mirror in front; vc and assist to block Lt hip from shifting beyond neutral while assisting to laterally flex upper trunk to the left (to reverse scoliosis curves); pt able to maintain x 4 minutes while talking about her condition     Exercises   Exercises Shoulder     Shoulder  Exercises: ROM/Strengthening   Proximal Shoulder Strengthening, Seated horizontal abdct, scapular retraction (hands parallel to floor and each diagonal) x 20 reps total             Balance Exercises - 01/19/17 2037      Balance Exercises: Standing   Tandem Stance Eyes open;Intermittent upper extremity support;3 reps;10 secs  // bars   SLS Eyes open;Intermittent upper extremity support;3 reps;10 secs  //bars   Rockerboard Anterior/posterior;Lateral;EO;30 seconds;5 reps;Intermittent UE support  // bars   Tandem Gait Forward;Upper extremity support;2 reps  // bars x 8 ft   Other Standing Exercises Bosu in // bars; level x 30 sec with bil UE assist; tip ant/post x 10 reps           PT Education - 01/19/17 2041    Education provided Yes  Education Details Continue to walk with rollator like she walks with her "grey man" walker   Person(s) Educated Patient   Methods Explanation;Demonstration   Comprehension Verbalized understanding;Returned demonstration;Need further instruction          PT Short Term Goals - 12/31/16 1556      PT SHORT TERM GOAL #1   Title Pt will be IND in progressed HEP to improve posture, balance, strength, and flexibility. TARGET DATE FOR ALL STGS: 01/01/17   Status On-going     PT SHORT TERM GOAL #2   Title Pt will improve TUG time with LRAD to </=20 sec. to decr. falls risk.    Baseline 3/15  40.69 seconds   Status Not Met     PT SHORT TERM GOAL #3   Title Perform BERG and DGI and write goals as indicated.    Baseline 2/27 BERG 26/56   Status Achieved     PT SHORT TERM GOAL #4   Title Pt will improve gait speed to >/=1.80f/sec. to decr. falls risk.    Status On-going     PT SHORT TERM GOAL #5   Title Pt will amb. 300' over even terrain with LRAD at MOD I level to improve functionla mobility.    Status On-going           PT Long Term Goals - 01/19/17 2047      PT LONG TERM GOAL #1   Title Pt will improve gait speed with LRAD to  >/=2.677fsec to safely amb. in the community. TARGET DATE FOR ALL LTGS: 01/29/17  **Revised 12/31/16 with goal of 1.25 ft/sec   Baseline 2/16 1.17 ft/sec; 3/15 0.7564fec   Time 4   Period Weeks   Status Revised     PT LONG TERM GOAL #2   Title Pt will amb. 500' over even/uneven terrain with LRAD at MOD I level to improve functional mobility.    Status New     PT LONG TERM GOAL #3   Title Pt will amb. 150' with SPC at MOD I level (indoors) in order to safely amb. at home. 4/3 deferred per discussion with patient (currently does not feel is realistic goal and agrees to defer)   Status Deferred     PT LONG TERM GOAL #4   Title Pt will perform TUG with LRAD in </=13.5sec. to decr. falls risk. **Revised 3/15--decrease TUG to <30 seconds to indicate lesser fall risk   Time 4   Period Weeks   Status Revised     PT LONG TERM GOAL #5   Title Patient will demostrate reduced risk of falling reflected by incr Berg Balance Assessment to 33/56.   Time 4   Period Weeks   Status New               Plan - 01/19/17 2042    Clinical Impression Statement Patient again late for appt--reports she has to rely on friends to drive her to appts. Session focused on upright posture, strengthening, gait training, and balance training. Patient remains very motivated with goal to improve for her 50th high school reunion. Continue to progress toward LTGs   Rehab Potential Good   PT Frequency 2x / week   PT Duration 8 weeks   PT Treatment/Interventions ADLs/Self Care Home Management;Biofeedback;Canalith Repostioning;Electrical Stimulation;Neuromuscular re-education;Balance training;Therapeutic exercise;Therapeutic activities;Manual techniques;Functional mobility training;Stair training;Gait training;DME Instruction;Patient/family education;Orthotic Fit/Training;Vestibular   PT Next Visit Plan 10th visit G code & PN: gait training/safety with rollator (remind to walk like with  grey walker; leg press; Nu-step;  SLS and balance; tandem; postural corrections with standing mirror   Consulted and Agree with Plan of Care Patient      Patient will benefit from skilled therapeutic intervention in order to improve the following deficits and impairments:  Abnormal gait, Decreased endurance, Decreased knowledge of use of DME, Decreased balance, Decreased mobility, Decreased range of motion, Postural dysfunction, Impaired flexibility, Pain, Decreased strength  Visit Diagnosis: Other abnormalities of gait and mobility  Muscle weakness (generalized)  Abnormal posture     Problem List Patient Active Problem List   Diagnosis Date Noted  . Scoliosis 11/20/2016  . Long term current use of anticoagulant 09/18/2016  . Compression fracture of lumbar spine, non-traumatic, sequela 08/11/2016  . Multiple falls 08/11/2016  . At high risk for injury related to fall 08/11/2016  . Encounter for therapeutic drug monitoring 11/27/2013  . Hypothyroidism   . CHF (congestive heart failure) ([place])   . Paroxysmal atrial fibrillation (HCC)   . Arthritis   . Neurogenic bladder   . Warfarin anticoagulation   . First degree heart block 01/29/2012  . 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 S/P Maze operation for atrial fibrillation 01/26/2012  . Anxiety 01/18/2012  . Multiple sclerosis ([place]) 01/19/2007    Rexanne Mano, PT 01/19/2017, 8:52 PM  [facility] [address] [place], [place], [address] Phone: [phone]   Fax:  [phone]  Name: [name] [name] MRN: [id number] Date of Birth: 13-Jun-1951

## 2017-01-20 NOTE — Patient Instructions (Signed)
Elsevier Interactive Patient Education ? 2017 Elsevier Inc.

## 2017-01-21 ENCOUNTER — Ambulatory Visit: Payer: Medicare Other | Admitting: Physical Therapy

## 2017-01-21 ENCOUNTER — Encounter: Payer: Self-pay | Admitting: Physical Therapy

## 2017-01-21 DIAGNOSIS — M6281 Muscle weakness (generalized): Secondary | ICD-10-CM

## 2017-01-21 DIAGNOSIS — E039 Hypothyroidism, unspecified: Secondary | ICD-10-CM | POA: Diagnosis not present

## 2017-01-21 DIAGNOSIS — R293 Abnormal posture: Secondary | ICD-10-CM | POA: Diagnosis not present

## 2017-01-21 DIAGNOSIS — R2689 Other abnormalities of gait and mobility: Secondary | ICD-10-CM | POA: Diagnosis not present

## 2017-01-21 NOTE — Patient Instructions (Signed)
Standing Hip Flexion    Standing Hip Abduction    While standing, raise your leg out to the side. Keep your knee straight and maintain your toes pointed forward the entire time.  Repeat 10 times for 1 sets.  1 sessions per day.  Use your arms for support if needed for balance and safety.  Standing Hip Extension    While standing, balance on one leg and move your other leg in a backward direction. Do not swing the leg. Perform smooth and controlled movements.   Keep your trunk stable and without arching during the movement.  Repeat 10 times for 1 sets.  Perform 1 sessions per day.  Use your arms for support if needed for balance and safety.

## 2017-01-22 NOTE — Therapy (Signed)
[facility] [address] [place], [place], [address] Phone: [phone]   Fax:  [phone]  Physical Therapy Treatment  Patient Details  Name: [name] [name] MRN: [id number] Date of Birth: 1951-03-29 Referring Provider: Dr. Raoul Pitch  Encounter Date: 01/21/2017      PT End of Session - 01/21/17 1530    Visit Number 10   Number of Visits 17   Date for PT Re-Evaluation 02/02/17   Authorization Type G-CODE AND PROGRESS NOTE EVERY 10TH VISIT.    PT Start Time 1448   PT Stop Time 1532   PT Time Calculation (min) 44 min   Activity Tolerance Patient tolerated treatment well;Patient limited by fatigue  frequent seated rest breaks due to LE weakness and SOB   Behavior During Therapy [facility] for tasks assessed/performed      Past Medical History:  Diagnosis Date  . Anxiety   . Arthritis    knees  . Breast cancer ([place]) 1999  . CHF (congestive heart failure) ([place])    Related to severe mitral regurgitation, April, 2013  . 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 Assess by pulmonary team in the hospital April, 2013  . Ejection fraction    EF 60%, echo, April, 2013, with severe MR before mitral valve replacement  . Herpes   . Hypothyroidism   . IBS (irritable bowel syndrome)   . Mitral valve regurgitation    Mitral valve replacement April, 2013, Mitral valve prolapse  . Multiple sclerosis ([place])   . Neurogenic bladder   . Osteoporosis   . Ovarian cyst   . Paroxysmal atrial fibrillation (HCC)    Rapid atrial fibrillation in-hospital, Rapid cardioversion,  before mitral valve surgery  . Pulmonary hypertension    Echo, April, 2013, before mitral valve surgery  . S/P Maze operation for atrial fibrillation 01/26/2012   Complete biatrial lesion set using cryothermy via right mini thoracotomy  . S/P mitral valve replacement 01/26/2012   32m Sorin [employer] Optiform mechanical prosthesis via  right mini thoracotomy  . Warfarin anticoagulation    Mechanical mitral prosthesis, April, 20136    Past Surgical History:  Procedure Laterality Date  . BREAST LUMPECTOMY Right 1999   with sent.node, and axillary dissection (20)  . CHEST TUBE INSERTION  01/26/2012   Procedure: CHEST TUBE INSERTION;  Surgeon: CRexene Alberts MD;  [facility]  Service: Open Heart Surgery;  Laterality: Left;  . COLONOSCOPY  2010   "normal"  . CYSTOSCOPY  1992  . LAPAROSCOPIC OVARIAN CYSTECTOMY  1978   urethral stricture repair  . LEFT AND RIGHT HEART CATHETERIZATION WITH CORONARY ANGIOGRAM N/A 01/20/2012   Procedure: LEFT AND RIGHT HEART CATHETERIZATION WITH CORONARY ANGIOGRAM;  Surgeon: CBurnell Blanks MD;  [facility];  Service: Cardiovascular;  Laterality: N/A;  . LYMPHADENECTOMY    . MAZE  01/26/2012   Procedure: MAZE;  Surgeon: CRexene Alberts MD;  [place]  Service: Open Heart Surgery;  Laterality: N/A;  . MITRAL VALVE REPLACEMENT  01/26/2012   Procedure: MINIMALLY INVASIVE MITRAL VALVE (MV) REPLACEMENT;  Surgeon: CRexene Alberts MD;  [facility]  Service: Open Heart Surgery;  Laterality: Right;  . TEE WITHOUT CARDIOVERSION  01/19/2012   Procedure: TRANSESOPHAGEAL ECHOCARDIOGRAM (TEE);  Surgeon: Peter M JMartinique MD;  [facility];  Service: Cardiovascular;  Laterality: N/A;  . TONSILLECTOMY  1970  . [place] WRI[place] Right 2012  There were no vitals filed for this visit.      Subjective Assessment - 01/21/17 1452    Subjective States her quads were pretty sore after last visit. Worked with trainer on her legs yesterday.   Pertinent History MS (neurogenic bladder), first degree heart block, hx of R breast CA, a-fib, arthritis (B Knees), CHF, pt is on coumadin, hypothyroidism, scoliosis   Limitations Standing   How long can you stand comfortably? 1-2 minutes max   Patient Stated Goals Stand up straight and walk with very  little assistance (with a cane vs. rollator)    Currently in Pain? Yes   Pain Score 2    Pain Location Back   Pain Orientation Lower   Pain Descriptors / Indicators --  stiff   Pain Type Chronic pain   Pain Onset More than a month ago                         [facility] Adult PT Treatment/Exercise - 01/22/17 0001      Knee/Hip Exercises: Stretches   Passive Hamstring Stretch Both;1 rep  3 min; seated lower legs up on stool             Balance Exercises - 01/22/17 0647      Balance Exercises: Standing   Balance Beam black, horizontal, wide stance intermittent UE support x 10 attempts (at most 5 sec)   Other Standing Exercises Bosu in //bars, flat side down, balance x 2 minutes with intermittent UE assist; wt-shifting RLE, LLE and small marches each x 20; step over back black beam light UE support            PT Education - 01/22/17 0654    Education provided Yes   Education Details Additions to [employer]) Educated Patient   Methods Explanation;Demonstration;Handout   Comprehension Verbalized understanding;Returned demonstration          PT Short Term Goals - 12/31/16 1556      PT SHORT TERM GOAL #1   Title Pt will be IND in progressed HEP to improve posture, balance, strength, and flexibility. TARGET DATE FOR ALL STGS: 01/01/17   Status On-going     PT SHORT TERM GOAL #2   Title Pt will improve TUG time with LRAD to </=20 sec. to decr. falls risk.    Baseline 3/15  40.69 seconds   Status Not Met     PT SHORT TERM GOAL #3   Title Perform BERG and DGI and write goals as indicated.    Baseline 2/27 BERG 26/56   Status Achieved     PT SHORT TERM GOAL #4   Title Pt will improve gait speed to >/=1.53f/sec. to decr. falls risk.    Status On-going     PT SHORT TERM GOAL #5   Title Pt will amb. 300' over even terrain with LRAD at MOD I level to improve functionla mobility.    Status On-going           PT Long Term Goals - 01/19/17 2047       PT LONG TERM GOAL #1   Title Pt will improve gait speed with LRAD to >/=2.654fsec to safely amb. in the community. TARGET DATE FOR ALL LTGS: 01/29/17  **Revised 12/31/16 with goal of 1.25 ft/sec   Baseline 2/16 1.17 ft/sec; 3/15 0.7593fec   Time 4   Period Weeks   Status Revised     PT LONG TERM GOAL #2  Title Pt will amb. 500' over even/uneven terrain with LRAD at MOD I level to improve functional mobility.    Status New     PT LONG TERM GOAL #3   Title Pt will amb. 150' with SPC at MOD I level (indoors) in order to safely amb. at home. 4/3 deferred per discussion with patient (currently does not feel is realistic goal and agrees to defer)   Status Deferred     PT LONG TERM GOAL #4   Title Pt will perform TUG with LRAD in </=13.5sec. to decr. falls risk. **Revised 3/15--decrease TUG to <30 seconds to indicate lesser fall risk   Time 4   Period Weeks   Status Revised     PT LONG TERM GOAL #5   Title Patient will demostrate reduced risk of falling reflected by incr Berg Balance Assessment to 33/56.   Time 4   Period Weeks   Status New               Plan - 02/07/2017 1530    Clinical Impression Statement Session focused on upright posture and strengthening postural muscles and LEs, gait training, and balance training. Began discussing her plans for continued exercise after discharge from PT and she plans to return to working out at the gym and continue with personal trainer 1x/wk. Overall, she is highly motivated and making good progress towards LTGs.   Rehab Potential Good   PT Frequency 2x / week   PT Duration 8 weeks   PT Treatment/Interventions ADLs/Self Care Home Management;Biofeedback;Canalith Repostioning;Electrical Stimulation;Neuromuscular re-education;Balance training;Therapeutic exercise;Therapeutic activities;Manual techniques;Functional mobility training;Stair training;Gait training;DME Instruction;Patient/family education;Orthotic Fit/Training;Vestibular    PT Next Visit Plan add posture ex's to HEP: gait training/safety with rollator (remind to walk like with grey walker; leg press; Nu-step; SLS and balance; tandem; postural corrections with standing mirror   Consulted and Agree with Plan of Care Patient      Patient will benefit from skilled therapeutic intervention in order to improve the following deficits and impairments:  Abnormal gait, Decreased endurance, Decreased knowledge of use of DME, Decreased balance, Decreased mobility, Decreased range of motion, Postural dysfunction, Impaired flexibility, Pain, Decreased strength  Visit Diagnosis: Other abnormalities of gait and mobility  Muscle weakness (generalized)  Abnormal posture       G-Codes - 07-Feb-2017 1530    Functional Assessment Tool Used (Outpatient Only)  Gait speed with rollator: 0.75 ft/sec; TUG with rollator: 40.69 sec.    Functional Limitation Mobility: Walking and moving around   Mobility: Walking and Moving Around Current Status [phone]) At least 60 percent but less than 80 percent impaired, limited or restricted   Mobility: Walking and Moving Around Goal Status [phone]) At least 20 percent but less than 40 percent impaired, limited or restricted     Physical Therapy Progress Note  Dates of Reporting Period: 12/04/2016 to 2017/02/07  Objective Reports of Subjective Statement: I can walk much better with rollator now  Objective Measurements: see G code above  Goal Update: NA  Plan: Continue PT POC  Reason Skilled Services are Required: remains high fall risk    Problem List Patient Active Problem List   Diagnosis Date Noted  . Scoliosis 11/20/2016  . Long term current use of anticoagulant 09/18/2016  . Compression fracture of lumbar spine, non-traumatic, sequela 08/11/2016  . Multiple falls 08/11/2016  . At high risk for injury related to fall 08/11/2016  . Encounter for therapeutic drug monitoring 11/27/2013  . Hypothyroidism   . CHF (congestive heart  failure) ([place])   . Paroxysmal atrial fibrillation (HCC)   . Arthritis   . Neurogenic bladder   . Warfarin anticoagulation   . First degree heart block 01/29/2012  . 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 S/P Maze operation for atrial fibrillation 01/26/2012  . Anxiety 01/18/2012  . Multiple sclerosis ([place]) 01/19/2007    Rexanne Mano, PT 01/22/2017, 7:02 AM  [facility] [address] [place], [place], [address] Phone: [phone]   Fax:  [phone]  Name: [name] [name] MRN: [id number] Date of Birth: 13-Aug-1951

## 2017-01-25 ENCOUNTER — Ambulatory Visit (INDEPENDENT_AMBULATORY_CARE_PROVIDER_SITE_OTHER): Payer: Medicare Other | Admitting: *Deleted

## 2017-01-25 DIAGNOSIS — Z952 Presence of prosthetic heart valve: Secondary | ICD-10-CM

## 2017-01-25 DIAGNOSIS — Z9889 Other specified postprocedural states: Secondary | ICD-10-CM | POA: Diagnosis not present

## 2017-01-25 DIAGNOSIS — I5030 Unspecified diastolic (congestive) heart failure: Secondary | ICD-10-CM

## 2017-01-25 DIAGNOSIS — Z8679 Personal history of other diseases of the circulatory system: Secondary | ICD-10-CM | POA: Diagnosis not present

## 2017-01-25 DIAGNOSIS — Z5181 Encounter for therapeutic drug level monitoring: Secondary | ICD-10-CM

## 2017-01-25 LAB — POCT INR: INR: 2.7

## 2017-01-26 ENCOUNTER — Encounter: Payer: Self-pay | Admitting: Physical Therapy

## 2017-01-26 ENCOUNTER — Ambulatory Visit: Payer: Medicare Other | Admitting: Physical Therapy

## 2017-01-26 DIAGNOSIS — M6281 Muscle weakness (generalized): Secondary | ICD-10-CM

## 2017-01-26 DIAGNOSIS — R2689 Other abnormalities of gait and mobility: Secondary | ICD-10-CM

## 2017-01-26 DIAGNOSIS — R293 Abnormal posture: Secondary | ICD-10-CM

## 2017-01-26 NOTE — Therapy (Signed)
[facility] [address] [place], [place], [address] Phone: [phone]   Fax:  [phone]  Physical Therapy Treatment  Patient Details  Name: [name] [name] MRN: [id number] Date of Birth: July 06, 1951 Referring Provider: Dr. Raoul Pitch  Encounter Date: 01/26/2017      PT End of Session - 01/26/17 1412    Visit Number 11   Number of Visits 17   Date for PT Re-Evaluation 02/02/17   Authorization Type G-CODE AND PROGRESS NOTE EVERY 10TH VISIT.    PT Start Time 1405   PT Stop Time 1446   PT Time Calculation (min) 41 min   Activity Tolerance Patient tolerated treatment well;Patient limited by fatigue  frequent seated rest breaks due to LE weakness and SOB   Behavior During Therapy [facility] for tasks assessed/performed      Past Medical History:  Diagnosis Date  . Anxiety   . Arthritis    knees  . Breast cancer ([place]) 1999  . CHF (congestive heart failure) ([place])    Related to severe mitral regurgitation, April, 2013  . 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 Assess by pulmonary team in the hospital April, 2013  . Ejection fraction    EF 60%, echo, April, 2013, with severe MR before mitral valve replacement  . Herpes   . Hypothyroidism   . IBS (irritable bowel syndrome)   . Mitral valve regurgitation    Mitral valve replacement April, 2013, Mitral valve prolapse  . Multiple sclerosis ([place])   . Neurogenic bladder   . Osteoporosis   . Ovarian cyst   . Paroxysmal atrial fibrillation (HCC)    Rapid atrial fibrillation in-hospital, Rapid cardioversion,  before mitral valve surgery  . Pulmonary hypertension    Echo, April, 2013, before mitral valve surgery  . S/P Maze operation for atrial fibrillation 01/26/2012   Complete biatrial lesion set using cryothermy via right mini thoracotomy  . S/P mitral valve replacement 01/26/2012   38m Sorin [employer] Optiform mechanical prosthesis via  right mini thoracotomy  . Warfarin anticoagulation    Mechanical mitral prosthesis, April, 20136    Past Surgical History:  Procedure Laterality Date  . BREAST LUMPECTOMY Right 1999   with sent.node, and axillary dissection (20)  . CHEST TUBE INSERTION  01/26/2012   Procedure: CHEST TUBE INSERTION;  Surgeon: CRexene Alberts MD;  [facility]  Service: Open Heart Surgery;  Laterality: Left;  . COLONOSCOPY  2010   "normal"  . CYSTOSCOPY  1992  . LAPAROSCOPIC OVARIAN CYSTECTOMY  1978   urethral stricture repair  . LEFT AND RIGHT HEART CATHETERIZATION WITH CORONARY ANGIOGRAM N/A 01/20/2012   Procedure: LEFT AND RIGHT HEART CATHETERIZATION WITH CORONARY ANGIOGRAM;  Surgeon: CBurnell Blanks MD;  [facility];  Service: Cardiovascular;  Laterality: N/A;  . LYMPHADENECTOMY    . MAZE  01/26/2012   Procedure: MAZE;  Surgeon: CRexene Alberts MD;  [facility]  Service: Open Heart Surgery;  Laterality: N/A;  . MITRAL VALVE REPLACEMENT  01/26/2012   Procedure: MINIMALLY INVASIVE MITRAL VALVE (MV) REPLACEMENT;  Surgeon: CRexene Alberts MD;  [facility]  Service: Open Heart Surgery;  Laterality: Right;  . TEE WITHOUT CARDIOVERSION  01/19/2012   Procedure: TRANSESOPHAGEAL ECHOCARDIOGRAM (TEE);  Surgeon: Peter M JMartinique MD;  [facility];  Service: Cardiovascular;  Laterality: N/A;  . TONSILLECTOMY  1970  . [place] . WRIST SURGERY Right 2012  There were no vitals filed for this visit.      Subjective Assessment - 01/26/17 1406    Subjective States she just got new shoes as her old ones were >39 year old. Walking as she does with her 2 wheel walker (more upright and smooth/consecutive longer steps) hurts her back more.    Pertinent History MS (neurogenic bladder), first degree heart block, hx of R breast CA, a-fib, arthritis (B Knees), CHF, pt is on coumadin, hypothyroidism, scoliosis   Limitations Standing   How long can you stand  comfortably? 1-2 minutes max   Patient Stated Goals Stand up straight and walk with very little assistance (with a cane vs. rollator)    Currently in Pain? Yes   Pain Score 2    Pain Location Back   Pain Descriptors / Indicators --  stiff   Pain Type Chronic pain   Pain Onset More than a month ago                         [facility] Adult PT Treatment/Exercise - 01/26/17 0001      Bed Mobility   Bed Mobility Rolling Right;Rolling Left;Right Sidelying to Sit;Sit to Sidelying Right   Rolling Right 5: Supervision   Rolling Right Details (indicate cue type and reason) vc for incr ease/efficiency/limit pain   Rolling Left 5: Supervision   Rolling Left Details (indicate cue type and reason) vc for incr ease/efficiency/limit pain   Right Sidelying to Sit 6: Modified independent (Device/Increase time)   Sit to Sidelying Right 6: Modified independent (Device/Increase time)     Transfers   Sit to Stand 6: Modified independent (Device/Increase time)   Stand to Sit 6: Modified independent (Device/Increase time)   Number of Reps 10 reps;1 set     Ambulation/Gait   Ambulation/Gait Assistance 5: Supervision   Ambulation/Gait Assistance Details vc for smooth upright gait using rollator "walk like you do with your grey walker"   Ambulation Distance (Feet) 120 Feet  x 3   Assistive device Rollator   Gait Pattern Step-through pattern;Decreased stride length;Lateral trunk lean to right;Trunk flexed;Right foot flat;Left foot flat;Poor foot clearance - left;Poor foot clearance - right   Ambulation Surface Level;Indoor     Posture/Postural Control   Posture/Postural Control Postural limitations   Posture Comments seated chin tucks; W stretch in door frame 30 sec x 3; chin tucks sit and supine; standing with back to the wall; posture correctins infront of mirror                PT Education - 01/26/17 2300    Education provided Yes   Education Details Additions to [employer]) Educated Patient   Methods Explanation;Demonstration;Tactile cues;Verbal cues;Handout   Comprehension Verbalized understanding;Returned demonstration;Need further instruction          PT Short Term Goals - 12/31/16 1556      PT SHORT TERM GOAL #1   Title Pt will be IND in progressed HEP to improve posture, balance, strength, and flexibility. TARGET DATE FOR ALL STGS: 01/01/17   Status On-going     PT SHORT TERM GOAL #2   Title Pt will improve TUG time with LRAD to </=20 sec. to decr. falls risk.    Baseline 3/15  40.69 seconds   Status Not Met     PT SHORT TERM GOAL #3   Title Perform BERG and DGI and write goals as indicated.    Baseline 2/27 BERG  26/56   Status Achieved     PT SHORT TERM GOAL #4   Title Pt will improve gait speed to >/=1.31f/sec. to decr. falls risk.    Status On-going     PT SHORT TERM GOAL #5   Title Pt will amb. 300' over even terrain with LRAD at MOD I level to improve functionla mobility.    Status On-going           PT Long Term Goals - 01/19/17 2047      PT LONG TERM GOAL #1   Title Pt will improve gait speed with LRAD to >/=2.673fsec to safely amb. in the community. TARGET DATE FOR ALL LTGS: 01/29/17  **Revised 12/31/16 with goal of 1.25 ft/sec   Baseline 2/16 1.17 ft/sec; 3/15 0.7559fec   Time 4   Period Weeks   Status Revised     PT LONG TERM GOAL #2   Title Pt will amb. 500' over even/uneven terrain with LRAD at MOD I level to improve functional mobility.    Status New     PT LONG TERM GOAL #3   Title Pt will amb. 150' with SPC at MOD I level (indoors) in order to safely amb. at home. 4/3 deferred per discussion with patient (currently does not feel is realistic goal and agrees to defer)   Status Deferred     PT LONG TERM GOAL #4   Title Pt will perform TUG with LRAD in </=13.5sec. to decr. falls risk. **Revised 3/15--decrease TUG to <30 seconds to indicate lesser fall risk   Time 4   Period Weeks   Status Revised      PT LONG TERM GOAL #5   Title Patient will demostrate reduced risk of falling reflected by incr Berg Balance Assessment to 33/56.   Time 4   Period Weeks   Status New               Plan - 01/26/17 2301    Clinical Impression Statement Session focused on bed mobiltiy (per pt request), posture corrections and exercises, and walking. Patient reports feeling a bit tired today (had multiple errands to run prior to PT). Required more rest breaks than usually.    Rehab Potential Good   PT Frequency 2x / week   PT Duration 8 weeks   PT Treatment/Interventions ADLs/Self Care Home Management;Biofeedback;Canalith Repostioning;Electrical Stimulation;Neuromuscular re-education;Balance training;Therapeutic exercise;Therapeutic activities;Manual techniques;Functional mobility training;Stair training;Gait training;DME Instruction;Patient/family education;Orthotic Fit/Training;Vestibular   PT Next Visit Plan leg press; Nu-step; SLS and balance; tandem; postural corrections with standing mirror   Consulted and Agree with Plan of Care Patient      Patient will benefit from skilled therapeutic intervention in order to improve the following deficits and impairments:  Abnormal gait, Decreased endurance, Decreased knowledge of use of DME, Decreased balance, Decreased mobility, Decreased range of motion, Postural dysfunction, Impaired flexibility, Pain, Decreased strength  Visit Diagnosis: Other abnormalities of gait and mobility  Muscle weakness (generalized)  Abnormal posture     Problem List Patient Active Problem List   Diagnosis Date Noted  . Scoliosis 11/20/2016  . Long term current use of anticoagulant 09/18/2016  . Compression fracture of lumbar spine, non-traumatic, sequela 08/11/2016  . Multiple falls 08/11/2016  . At high risk for injury related to fall 08/11/2016  . Encounter for therapeutic drug monitoring 11/27/2013  . Hypothyroidism   . CH[place] ([place]ive heart failure) ([place] .  Paroxysmal atrial fibrillation (HCC)   . Arthritis   . Neurogenic bladder   .  Warfarin anticoagulation   . First degree heart block 01/29/2012  . 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 S/P Maze operation for atrial fibrillation 01/26/2012  . Anxiety 01/18/2012  . Multiple sclerosis ([place]) 01/19/2007    Rexanne Mano, PT 01/26/2017, 11:07 PM  [facility] [address] [place], [place], [address] Phone: [phone]   Fax:  [phone]  Name: [name] [name] MRN: [id number] Date of Birth: [birth year]

## 2017-01-26 NOTE — Patient Instructions (Signed)
Extension    Stand with your back to the wall. Pull shoulders back and squeeze shoulder blades together. Try to bring back of your neck toward the wall. Hold for 30 seconds. Repeat x3 reps.   Copyright  VHI. All rights reserved.      Rolling in Bed  1. Bend both knees. 2. Roll knees to your right as your turning head to the right and left arm reaches across body towards the right.

## 2017-01-28 ENCOUNTER — Other Ambulatory Visit: Payer: Medicare Other

## 2017-01-29 ENCOUNTER — Telehealth: Payer: Self-pay | Admitting: *Deleted

## 2017-01-29 DIAGNOSIS — M4150 Other secondary scoliosis, site unspecified: Secondary | ICD-10-CM

## 2017-01-29 NOTE — Telephone Encounter (Signed)
Patient called and left a message stating she feels like her scoliosis is getting worse and affecting her right side and would like a referral to a Dr Cyndy Freeze- Neurosurgeon. [phone]. She states she is seeing Dr Tomi Likens but he is dealing with her MS. She is also requesting a referral for additional PT at [facility] . Please advise.

## 2017-02-01 NOTE — Telephone Encounter (Signed)
Pt requesting additional PT for MS. She is followed by Dr. Tomi Likens for her MS. She is due for PT re-eval tomorrow by PT notes. I would recommend she wait for PT's report and recommendations and Dr. Georgie Chard recommendations since she follows with him for this condition he will guide her on PT needs in the future.   Pt also asking for neurosurgeon referral for her scoliosis. I will place this today for her. Please make sure to send image of lumbar spine 07/31/2016. She is requesting Dr. Cyndy Freeze.  Any further referrals requested should be addressed in an office visit for that specific problem.

## 2017-02-01 NOTE — Telephone Encounter (Signed)
Spoke with patient reviewed information and instructions. Patient verbalized understanding. 

## 2017-02-02 ENCOUNTER — Encounter: Payer: Self-pay | Admitting: Physical Therapy

## 2017-02-02 ENCOUNTER — Ambulatory Visit: Payer: Medicare Other | Admitting: Physical Therapy

## 2017-02-02 DIAGNOSIS — M6281 Muscle weakness (generalized): Secondary | ICD-10-CM

## 2017-02-02 DIAGNOSIS — R293 Abnormal posture: Secondary | ICD-10-CM | POA: Diagnosis not present

## 2017-02-02 DIAGNOSIS — R2689 Other abnormalities of gait and mobility: Secondary | ICD-10-CM

## 2017-02-02 NOTE — Therapy (Signed)
[facility] [address] [place], [place], [address] Phone: [phone]   Fax:  [phone]  Physical Therapy Treatment  Patient Details  Name: [name] [name] MRN: [id number] Date of Birth: 12-03-1950 Referring Provider: Dr. Raoul Pitch  Encounter Date: 02/02/2017      PT End of Session - 02/02/17 1741    Visit Number 12   Number of Visits 24  7/51 re-certification for 12 (+12 as of 4/17)= 24   Date for PT Re-Evaluation 02/02/17   Authorization Type G-CODE AND PROGRESS NOTE EVERY 10TH VISIT.    PT Start Time 1400   PT Stop Time 1444   PT Time Calculation (min) 44 min   Activity Tolerance Patient tolerated treatment well;Patient limited by fatigue   Behavior During Therapy [facility] for tasks assessed/performed      Past Medical History:  Diagnosis Date  . Anxiety   . Arthritis    knees  . Breast cancer ([place]) 1999  . CHF (congestive heart failure) ([place])    Related to severe mitral regurgitation, April, 2013  . 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 Assess by pulmonary team in the hospital April, 2013  . Ejection fraction    EF 60%, echo, April, 2013, with severe MR before mitral valve replacement  . Herpes   . Hypothyroidism   . IBS (irritable bowel syndrome)   . Mitral valve regurgitation    Mitral valve replacement April, 2013, Mitral valve prolapse  . Multiple sclerosis ([place])   . Neurogenic bladder   . Osteoporosis   . Ovarian cyst   . Paroxysmal atrial fibrillation (HCC)    Rapid atrial fibrillation in-hospital, Rapid cardioversion,  before mitral valve surgery  . Pulmonary hypertension ([place])    Echo, April, 2013, before mitral valve surgery  . S/P Maze operation for atrial fibrillation 01/26/2012   Complete biatrial lesion set using cryothermy via right mini thoracotomy  . S/P mitral valve replacement 01/26/2012   32m Sorin [employer] Optiform mechanical prosthesis via  right mini thoracotomy  . Warfarin anticoagulation    Mechanical mitral prosthesis, April, 20136    Past Surgical History:  Procedure Laterality Date  . BREAST LUMPECTOMY Right 1999   with sent.node, and axillary dissection (20)  . CHEST TUBE INSERTION  01/26/2012   Procedure: CHEST TUBE INSERTION;  Surgeon: CRexene Alberts MD;  [facility]  Service: Open Heart Surgery;  Laterality: Left;  . COLONOSCOPY  2010   "normal"  . CYSTOSCOPY  1992  . LAPAROSCOPIC OVARIAN CYSTECTOMY  1978   urethral stricture repair  . LEFT AND RIGHT HEART CATHETERIZATION WITH CORONARY ANGIOGRAM N/A 01/20/2012   Procedure: LEFT AND RIGHT HEART CATHETERIZATION WITH CORONARY ANGIOGRAM;  Surgeon: CBurnell Blanks MD;  [facility];  Service: Cardiovascular;  Laterality: N/A;  . LYMPHADENECTOMY    . MAZE  01/26/2012   Procedure: MAZE;  Surgeon: CRexene Alberts MD;  [facility]  Service: Open Heart Surgery;  Laterality: N/A;  . MITRAL VALVE REPLACEMENT  01/26/2012   Procedure: MINIMALLY INVASIVE MITRAL VALVE (MV) REPLACEMENT;  Surgeon: CRexene Alberts MD;  [facility]  Service: Open Heart Surgery;  Laterality: Right;  . TEE WITHOUT CARDIOVERSION  01/19/2012   Procedure: TRANSESOPHAGEAL ECHOCARDIOGRAM (TEE);  Surgeon: Peter M JMartinique MD;  [facility];  Service: Cardiovascular;  Laterality: N/A;  . TONSILLECTOMY  1970  . [place] . WRIST SURGERY Right 2012  There were no vitals filed for this visit.      Subjective Assessment - 02/02/17 1406    Subjective Wants to extend her PT "I feel like I need another round." Still having issues with walking and leaning. Has an appt with a neurosurgeon, Dr. Cyndy Freeze, 5/2. Patient hoping he will recommend a brace   Pertinent History MS (neurogenic bladder), first degree heart block, hx of R breast CA, a-fib, arthritis (B Knees), CHF, pt is on coumadin, hypothyroidism, scoliosis   Limitations Standing   How long can you  stand comfortably? 1-2 minutes max   Patient Stated Goals Stand up straight and walk with very little assistance (with a cane vs. rollator)    Currently in Pain? Yes   Pain Score 3    Pain Location Rib cage   Pain Orientation Right   Pain Descriptors / Indicators Aching   Pain Type Chronic pain   Pain Onset More than a month ago                         [facility] Adult PT Treatment/Exercise - 02/02/17 1412      Transfers   Sit to Stand 6: Modified independent (Device/Increase time);With upper extremity assist;With armrests   Stand to Sit 6: Modified independent (Device/Increase time);With upper extremity assist;With armrests;To chair/3-in-1   Number of Reps 5 reps;1 set   Comments feet on blue airex cushion; stood without UE support x 3-15 seconds each time     Ambulation/Gait   Ambulation/Gait Assistance 5: Supervision   Ambulation/Gait Assistance Details cues for upright posture, relax RUE, incr stride length, trying to stay centered in rollator   Ambulation Distance (Feet) 364 Feet   Assistive device Rollator   Gait Pattern Step-through pattern;Decreased stride length;Lateral trunk lean to right;Trunk flexed;Right foot flat;Left foot flat;Poor foot clearance - left;Poor foot clearance - right   Ambulation Surface Level;Indoor   Gait velocity 2.24   Gait Comments Pt discussed hope to not need rollator forever. Offered to attempt [facility] and pt deferred b/c wants to bring in her own cane.      Posture/Postural Control   Posture/Postural Control Postural limitations     Standardized Balance Assessment   Standardized Balance Assessment Timed Up and Go Test     Timed Up and Go Test   Normal TUG (seconds) 30.91     Knee/Hip Exercises: Aerobic   Nustep L3 for 5 minutes     Knee/Hip Exercises: Standing   Forward Step Up Both;1 set;5 reps;Hand Hold: 2;Step Height: 6"                PT Education - 02/02/17 1740    Education provided Yes   Education Details  results of LTGs; discussed rationale for continued PT vs discharge and return after sees neurosurgeon; limits of PT and will not be able to reverse scoliosis   Person(s) Educated Patient   Methods Explanation;Demonstration   Comprehension Verbalized understanding          PT Short Term Goals - 02/02/17 1750      PT SHORT TERM GOAL #1   Title Pt will be IND in progressed HEP to improve posture, balance, strength, and flexibility. TARGET DATE FOR ALL STGS: 01/01/17; Met 4/17   Status Achieved     PT SHORT TERM GOAL #2   Title Pt will improve TUG time with LRAD to </=20 sec. to decr. falls risk.    Baseline 3/15  40.69 seconds  Status Not Met     PT SHORT TERM GOAL #3   Title Perform BERG and DGI and write goals as indicated.    Baseline 2/27 BERG 26/56   Status Achieved     PT SHORT TERM GOAL #4   Title Pt will improve gait speed to >/=1.46f/sec. to decr. falls risk. Met 4/17 when LTGs assessed   Status Achieved     PT SHORT TERM GOAL #5   Title Pt will amb. 300' over even terrain with LRAD at MOD I level to improve functionla mobility. Met 4/17 when LTGs assessed   Status Achieved     Additional Short Term Goals   Additional Short Term Goals Yes     PT SHORT TERM GOAL #6   Title Set 02/02/17  Patient will perform TUG <= 25 seconds. TARGET date 02/27/16   Time 3   Period Weeks   Status New     PT SHORT TERM GOAL #7   Title Patient will improve gait velocity to 2.40 ft/sec as progressing to gait velocity required for safe community ambulation. TARGET date 02/27/16   Time 3   Period Weeks   Status New     PT SHORT TERM GOAL #8   Title Patient will be independent with HEP exercises specifically for posture. TARGET date 02/27/16   Time 3   Period Weeks           PT Long Term Goals - 02/02/17 1742      PT LONG TERM GOAL #1   Title Pt will improve gait speed with LRAD to >/=2.652fsec to safely amb. in the community. TARGET DATE FOR ALL LTGS: 01/29/17  **Revised  12/31/16 with goal of 1.25 ft/sec   Baseline 2/16 1.17 ft/sec; 3/15 0.7587fec; 4/17  2.24 ft/sec   Time 4   Period Weeks   Status Achieved     PT LONG TERM GOAL #2   Title Pt will amb. 500' over even/uneven terrain with LRAD at MOD I level to improve functional mobility. 4/17 364 ft (continue goal-TARGET 03/18/17)   Status On-going     PT LONG TERM GOAL #3   Title Pt will amb. 150' with SPC at MOD I level (indoors) in order to safely amb. at home. 4/3 deferred per discussion with patient (currently does not feel is realistic goal and agrees to defer)   Status Deferred     PT LONG TERM GOAL #4   Title Pt will perform TUG with LRAD in </=13.5sec. to decr. falls risk. **Revised 3/15--decrease TUG to <30 seconds to indicate lesser fall risk; 02/02/17 TUG <20 seconds (TARGET 03/18/17)   Baseline 4/17 30.91    Time 6  updated 4/17   Period Weeks   Status On-going     PT LONG TERM GOAL #5   Title Patient will demostrate reduced risk of falling reflected by incr Berg Balance Assessment to 33/56. (TARGET 03/18/17)   Time 6  update 4/17   Period Weeks   Status On-going     Additional Long Term Goals   Additional Long Term Goals Yes     PT LONG TERM GOAL #6   Title Patient will ambulate with gait velocity >= 2.62 ft/sec for safe community ambulation. TARGET 03/18/17   Time 6   Period Weeks   Status New     PT LONG TERM GOAL #7   Title Patient will verbalize plan for continued exercise/activity in community upon discharge from PT.  TARGET 03/18/17   Time  6   Period Weeks   Status New               Plan - 02/02/17 1742    Clinical Impression Statement LTGs assessed with pt meeting 1 of 5 LTGs (made progress towards 3 others, 1 goal deferred previous session after pt discussion). Patient has made progress overall, just not as much as she or PT anticipated she could make. Continues to be limited by trunk weakness and postural imbalance due to scoliosis (from compression  fractures). Patient now has an appointment to see a neurosurgeon 5/2 and would like to "be as good as I can be" by the time she sees him. Will re-certify for an additional 6 weeks of PT (may not need this entire time; TBA after neurosurgery appt).    Rehab Potential Good   Clinical Impairments Affecting Rehab Potential scoliosis, MS   PT Frequency 2x / week   PT Duration 6 weeks  re-certification 8/93 for 6 weeks   PT Treatment/Interventions ADLs/Self Care Home Management;Biofeedback;Canalith Repostioning;Electrical Stimulation;Neuromuscular re-education;Balance training;Therapeutic exercise;Therapeutic activities;Manual techniques;Functional mobility training;Stair training;Gait training;DME Instruction;Patient/family education;Orthotic Fit/Training;Passive range of motion   PT Next Visit Plan repeat Berg; leg press; Nu-step; SLS and balance; tandem; postural corrections with standing mirror   Consulted and Agree with Plan of Care Patient      Patient will benefit from skilled therapeutic intervention in order to improve the following deficits and impairments:  Abnormal gait, Decreased endurance, Decreased knowledge of use of DME, Decreased balance, Decreased mobility, Decreased range of motion, Postural dysfunction, Impaired flexibility, Pain, Decreased strength  Visit Diagnosis: Other abnormalities of gait and mobility - Plan: PT plan of care cert/re-cert  Muscle weakness (generalized) - Plan: PT plan of care cert/re-cert  Abnormal posture - Plan: PT plan of care cert/re-cert     Problem List Patient Active Problem List   Diagnosis Date Noted  . Scoliosis 11/20/2016  . Long term current use of anticoagulant 09/18/2016  . Compression fracture of lumbar spine, non-traumatic, sequela 08/11/2016  . Multiple falls 08/11/2016  . At high risk for injury related to fall 08/11/2016  . Encounter for therapeutic drug monitoring 11/27/2013  . Hypothyroidism   . CHF (congestive heart failure)  ([place])   . Paroxysmal atrial fibrillation (HCC)   . Arthritis   . Neurogenic bladder   . Warfarin anticoagulation   . First degree heart block 01/29/2012  . 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 S/P Maze operation for atrial fibrillation 01/26/2012  . Anxiety 01/18/2012  . Multiple sclerosis ([place]) 01/19/2007    Rexanne Mano, PT 02/02/2017, 6:12 PM  [facility] [address] [place], [place], [address] Phone: [phone]   Fax:  [phone]  Name: YEHUDIT FULGINITI MRN: [id number] Date of Birth: 1951/07/13

## 2017-02-04 ENCOUNTER — Ambulatory Visit: Payer: Medicare Other | Admitting: Physical Therapy

## 2017-02-04 ENCOUNTER — Encounter: Payer: Self-pay | Admitting: Physical Therapy

## 2017-02-04 DIAGNOSIS — R2689 Other abnormalities of gait and mobility: Secondary | ICD-10-CM

## 2017-02-04 DIAGNOSIS — R293 Abnormal posture: Secondary | ICD-10-CM | POA: Diagnosis not present

## 2017-02-04 DIAGNOSIS — M6281 Muscle weakness (generalized): Secondary | ICD-10-CM | POA: Diagnosis not present

## 2017-02-04 NOTE — Therapy (Signed)
[facility] [address] [place], [place], [address] Phone: [phone]   Fax:  [phone]  Physical Therapy Treatment  Patient Details  Name: [name] [name] MRN: [id number] Date of Birth: [birth year] Referring Provider: Dr. Raoul Pitch  Encounter Date: 02/04/2017      PT End of Session - 02/04/17 1502    Visit Number 13   Number of Visits 24  7/37 re-certification for 12 (+12 as of 4/17)= 24   Date for PT Re-Evaluation 02/02/17   Authorization Type G-CODE AND PROGRESS NOTE EVERY 10TH VISIT.    PT Start Time 1405   PT Stop Time 1446   PT Time Calculation (min) 41 min   Activity Tolerance Patient tolerated treatment well   Behavior During Therapy WFL for tasks assessed/performed      Past Medical History:  Diagnosis Date  . Anxiety   . Arthritis    knees  . Breast cancer ([facility]) 1999  . CHF (congestive heart failure) ([facility])    Related to severe mitral regurgitation, April, 2013  . 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 Assess by pulmonary team in the hospital April, 2013  . Ejection fraction    EF 60%, echo, April, 2013, with severe MR before mitral valve replacement  . Herpes   . Hypothyroidism   . IBS (irritable bowel syndrome)   . Mitral valve regurgitation    Mitral valve replacement April, 2013, Mitral valve prolapse  . Multiple sclerosis ([facility])   . Neurogenic bladder   . Osteoporosis   . Ovarian cyst   . Paroxysmal atrial fibrillation (H[facility])    Rapid atrial fibrillation in-hospital, Rapid cardioversion,  before mitral valve surgery  . Pulmonary hypertension ([place])    Echo, April, 2013, before mitral valve surgery  . S/P Maze operation for atrial fibrillation 01/26/2012   Complete biatrial lesion set using cryothermy via right mini thoracotomy  . S/P mitral valve replacement 01/26/2012   42m Sorin [employer] Optiform mechanical prosthesis via right mini thoracotomy  .  Warfarin anticoagulation    Mechanical mitral prosthesis, April, 20136    Past Surgical History:  Procedure Laterality Date  . BREAST LUMPECTOMY Right 1999   with sent.node, and axillary dissection (20)  . CHEST TUBE INSERTION  01/26/2012   Procedure: CHEST TUBE INSERTION;  Surgeon: CRexene Alberts MD;  [facility]  Service: Open Heart Surgery;  Laterality: Left;  . COLONOSCOPY  2010   "normal"  . CYSTOSCOPY  1992  . LAPAROSCOPIC OVARIAN CYSTECTOMY  1978   urethral stricture repair  . LEFT AND RIGHT HEART CATHETERIZATION WITH CORONARY ANGIOGRAM N/A 01/20/2012   Procedure: LEFT AND RIGHT HEART CATHETERIZATION WITH CORONARY ANGIOGRAM;  Surgeon: CBurnell Blanks MD;  [facility];  Service: Cardiovascular;  Laterality: N/A;  . LYMPHADENECTOMY    . MAZE  01/26/2012   Procedure: MAZE;  Surgeon: CRexene Alberts MD;  [facility]  Service: Open Heart Surgery;  Laterality: N/A;  . MITRAL VALVE REPLACEMENT  01/26/2012   Procedure: MINIMALLY INVASIVE MITRAL VALVE (MV) REPLACEMENT;  Surgeon: CRexene Alberts MD;  [facility]  Service: Open Heart Surgery;  Laterality: Right;  . TEE WITHOUT CARDIOVERSION  01/19/2012   Procedure: TRANSESOPHAGEAL ECHOCARDIOGRAM (TEE);  Surgeon: Peter M JMartinique MD;  [facility];  Service: Cardiovascular;  Laterality: N/A;  . TONSILLECTOMY  1970  . [facility] . WRIST SURGERY Right 2012    There  were no vitals filed for this visit.      Subjective Assessment - 02/04/17 1450    Subjective Reports she walked with trainer's forearm support at home.    Pertinent History MS (neurogenic bladder), first degree heart block, hx of R breast CA, a-fib, arthritis ([place]), CHF, pt is on coumadin, hypothyroidism, scoliosis   Limitations Standing   How long can you stand comfortably? 1-2 minutes max   Patient Stated Goals Stand up straight and walk with very little assistance (with a cane vs. rollator)    Currently in  Pain? Yes   Pain Score 3    Pain Location Rib cage   Pain Orientation Right   Pain Descriptors / Indicators Aching   Pain Type Chronic pain   Pain Onset More than a month ago   Pain Frequency Intermittent            [facility] PT Assessment - 02/04/17 1452      Special Tests    Special Tests Leg LengthTest   Leg length test  True  greater trochanter to lateral malleolus     True   Right --  33 7/8   Left  --  34 3/8     Ambulation/Gait   Gait Comments All ambulation with 1/4" heel lift in Rt shoe. Able to stand most upright without device,however could only ambulate at most 10 ft and then takes standing rest breaks                     [facility] Adult PT Treatment/Exercise - 02/04/17 1452      Transfers   Sit to Stand 6: Modified independent (Device/Increase time);With upper extremity assist;From bed;From chair/3-in-1   Stand to Sit 6: Modified independent (Device/Increase time);With upper extremity assist;With armrests;To chair/3-in-1;To bed     Ambulation/Gait   Ambulation/Gait Assistance 4: Min guard   Ambulation/Gait Assistance Details with rollator; with 2 wheel RW, with forearm support vs no UE support; pt most upright with no UE support however steps shorten even more    Ambulation Distance (Feet) 100 Feet  80, 80, 120   Assistive device Rolling walker;Rollator;1 person hand held assist;None   Gait Pattern Step-through pattern;Decreased stride length;Lateral trunk lean to right;Trunk flexed;Right foot flat;Left foot flat;Poor foot clearance - left;Poor foot clearance - right   Ambulation Surface Level;Indoor     Posture/Postural Control   Posture/Postural Control Postural limitations   Postural Limitations Rounded Shoulders;Forward head;Increased thoracic kyphosis  thorax Rt convex curve; ?mild lumbar Lt convex curve                PT Education - 02/04/17 1501    Education provided Yes   Education Details RLE shorter than LLE (measured twice as  pt previously told LLE shorter and has been wearing 1/4" heel lift in Lt shoe for years)   Person(s) Educated Patient   Methods Explanation   Comprehension Verbalized understanding          PT Short Term Goals - 02/02/17 1750      PT SHORT TERM GOAL #1   Title Pt will be IND in progressed HEP to improve posture, balance, strength, and flexibility. TARGET DATE FOR ALL STGS: 01/01/17; Met 4/17   Status Achieved     PT SHORT TERM GOAL #2   Title Pt will improve TUG time with LRAD to </=20 sec. to decr. falls risk.    Baseline 3/15  40.69 seconds   Status Not Met  PT SHORT TERM GOAL #3   Title Perform BERG and DGI and write goals as indicated.    Baseline 2/27 BERG 26/56   Status Achieved     PT SHORT TERM GOAL #4   Title Pt will improve gait speed to >/=1.16f/sec. to decr. falls risk. Met 4/17 when LTGs assessed   Status Achieved     PT SHORT TERM GOAL #5   Title Pt will amb. 300' over even terrain with LRAD at MOD I level to improve functionla mobility. Met 4/17 when LTGs assessed   Status Achieved     Additional Short Term Goals   Additional Short Term Goals Yes     PT SHORT TERM GOAL #6   Title Set 02/02/17  Patient will perform TUG <= 25 seconds. TARGET date 02/27/16   Time 3   Period Weeks   Status New     PT SHORT TERM GOAL #7   Title Patient will improve gait velocity to 2.40 ft/sec as progressing to gait velocity required for safe community ambulation. TARGET date 02/27/16   Time 3   Period Weeks   Status New     PT SHORT TERM GOAL #8   Title Patient will be independent with HEP exercises specifically for posture. TARGET date 02/27/16   Time 3   Period Weeks           PT Long Term Goals - 02/02/17 1742      PT LONG TERM GOAL #1   Title Pt will improve gait speed with LRAD to >/=2.660fsec to safely amb. in the community. TARGET DATE FOR ALL LTGS: 01/29/17  **Revised 12/31/16 with goal of 1.25 ft/sec   Baseline 2/16 1.17 ft/sec; 3/15 0.758fec; 4/17   2.24 ft/sec   Time 4   Period Weeks   Status Achieved     PT LONG TERM GOAL #2   Title Pt will amb. 500' over even/uneven terrain with LRAD at MOD I level to improve functional mobility. 4/17 364 ft (continue goal-TARGET 03/18/17)   Status On-going     PT LONG TERM GOAL #3   Title Pt will amb. 150' with SPC at MOD I level (indoors) in order to safely amb. at home. 4/3 deferred per discussion with patient (currently does not feel is realistic goal and agrees to defer)   Status Deferred     PT LONG TERM GOAL #4   Title Pt will perform TUG with LRAD in </=13.5sec. to decr. falls risk. **Revised 3/15--decrease TUG to <30 seconds to indicate lesser fall risk; 02/02/17 TUG <20 seconds (TARGET 03/18/17)   Baseline 4/17 30.91    Time 6  updated 4/17   Period Weeks   Status On-going     PT LONG TERM GOAL #5   Title Patient will demostrate reduced risk of falling reflected by incr Berg Balance Assessment to 33/56. (TARGET 03/18/17)   Time 6  update 4/17   Period Weeks   Status On-going     Additional Long Term Goals   Additional Long Term Goals Yes     PT LONG TERM GOAL #6   Title Patient will ambulate with gait velocity >= 2.62 ft/sec for safe community ambulation. TARGET 03/18/17   Time 6   Period Weeks   Status New     PT LONG TERM GOAL #7   Title Patient will verbalize plan for continued exercise/activity in community upon discharge from PT.  TARGET 03/18/17   Time 6   Period Weeks  Status New               Plan - 02/04/17 1503    Clinical Impression Statement Session focused on re-assessing scoliosis curves (see notes) and assessing leg length discrepancies. Worked on gait and upright posture with various methods of UE support (including no device). Rollator remains pt's preference due to swivel wheels and seat. Will continue to work towards newer goals.    Rehab Potential Good   Clinical Impairments Affecting Rehab Potential scoliosis, MS   PT Frequency 2x / week    PT Duration 6 weeks  re-certification 1/61 for 6 weeks   PT Treatment/Interventions ADLs/Self Care Home Management;Biofeedback;Canalith Repostioning;Electrical Stimulation;Neuromuscular re-education;Balance training;Therapeutic exercise;Therapeutic activities;Manual techniques;Functional mobility training;Stair training;Gait training;DME Instruction;Patient/family education;Orthotic Fit/Training;Passive range of motion   PT Next Visit Plan repeat Berg; leg press; Nu-step; SLS and balance; tandem; postural corrections with standing mirror   Consulted and Agree with Plan of Care Patient      Patient will benefit from skilled therapeutic intervention in order to improve the following deficits and impairments:  Abnormal gait, Decreased endurance, Decreased knowledge of use of DME, Decreased balance, Decreased mobility, Decreased range of motion, Postural dysfunction, Impaired flexibility, Pain, Decreased strength  Visit Diagnosis: Other abnormalities of gait and mobility  Muscle weakness (generalized)  Abnormal posture     Problem List Patient Active Problem List   Diagnosis Date Noted  . Scoliosis 11/20/2016  . Long term current use of anticoagulant 09/18/2016  . Compression fracture of lumbar spine, non-traumatic, sequela 08/11/2016  . Multiple falls 08/11/2016  . At high risk for injury related to fall 08/11/2016  . Encounter for therapeutic drug monitoring 11/27/2013  . Hypothyroidism   . CHF (congestive heart failure) ([place])   . Paroxysmal atrial fibrillation ([facility])   . Arthritis   . Neurogenic bladder   . Warfarin anticoagulation   . First degree heart block 01/29/2012  . 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 S/P Maze operation for atrial fibrillation 01/26/2012  . Anxiety 01/18/2012  . Multiple sclerosis ([place]) 01/19/2007    Rexanne Mano , PT 02/04/2017, 3:07 PM  [facility] [address] [place], [place], [address] Phone: [phone]   Fax:  [phone]  Name: ZAWADI APLIN MRN: [id number] Date of Birth: 1950-12-05

## 2017-02-09 ENCOUNTER — Ambulatory Visit: Payer: Medicare Other | Admitting: Physical Therapy

## 2017-02-09 ENCOUNTER — Encounter: Payer: Self-pay | Admitting: Physical Therapy

## 2017-02-09 DIAGNOSIS — R2689 Other abnormalities of gait and mobility: Secondary | ICD-10-CM | POA: Diagnosis not present

## 2017-02-09 DIAGNOSIS — M6281 Muscle weakness (generalized): Secondary | ICD-10-CM | POA: Diagnosis not present

## 2017-02-09 DIAGNOSIS — R293 Abnormal posture: Secondary | ICD-10-CM

## 2017-02-09 NOTE — Therapy (Signed)
[facility] [address] [place], [place], [address] Phone: [phone]   Fax:  [phone]  Physical Therapy Treatment  Patient Details  Name: [name] [name] MRN: [id number] Date of Birth: 20-Jun-1951 Referring Provider: Dr. Raoul Pitch  Encounter Date: 02/09/2017      PT End of Session - 02/09/17 1647    Visit Number 14  G2   Number of Visits 24  3/71 re-certification for 12 (+12 as of 4/17)= 24   Date for PT Re-Evaluation 02/02/17   Authorization Type G-CODE AND PROGRESS NOTE EVERY 10TH VISIT.    PT Start Time 1538   PT Stop Time 1618   PT Time Calculation (min) 40 min   Activity Tolerance Patient tolerated treatment well   Behavior During Therapy WFL for tasks assessed/performed      Past Medical History:  Diagnosis Date  . Anxiety   . Arthritis    knees  . Breast cancer ([place]) 1999  . CHF (congestive heart failure) ([place])    Related to severe mitral regurgitation, April, 2013  . 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 Assess by pulmonary team in the hospital April, 2013  . Ejection fraction    EF 60%, echo, April, 2013, with severe MR before mitral valve replacement  . Herpes   . Hypothyroidism   . IBS (irritable bowel syndrome)   . Mitral valve regurgitation    Mitral valve replacement April, 2013, Mitral valve prolapse  . Multiple sclerosis ([place])   . Neurogenic bladder   . Osteoporosis   . Ovarian cyst   . Paroxysmal atrial fibrillation (H[facility])    Rapid atrial fibrillation in-hospital, Rapid cardioversion,  before mitral valve surgery  . Pulmonary hypertension ([place])    Echo, April, 2013, before mitral valve surgery  . S/P Maze operation for atrial fibrillation 01/26/2012   Complete biatrial lesion set using cryothermy via right mini thoracotomy  . S/P mitral valve replacement 01/26/2012   83m Sorin [employer] Optiform mechanical prosthesis via right mini thoracotomy   . Warfarin anticoagulation    Mechanical mitral prosthesis, April, 20136    Past Surgical History:  Procedure Laterality Date  . BREAST LUMPECTOMY Right 1999   with sent.node, and axillary dissection (20)  . CHEST TUBE INSERTION  01/26/2012   Procedure: CHEST TUBE INSERTION;  Surgeon: CRexene Alberts MD;  [facility]  Service: Open Heart Surgery;  Laterality: Left;  . COLONOSCOPY  2010   "normal"  . CYSTOSCOPY  1992  . LAPAROSCOPIC OVARIAN CYSTECTOMY  1978   urethral stricture repair  . LEFT AND RIGHT HEART CATHETERIZATION WITH CORONARY ANGIOGRAM N/A 01/20/2012   Procedure: LEFT AND RIGHT HEART CATHETERIZATION WITH CORONARY ANGIOGRAM;  Surgeon: CBurnell Blanks MD;  [facility];  Service: Cardiovascular;  Laterality: N/A;  . LYMPHADENECTOMY    . MAZE  01/26/2012   Procedure: MAZE;  Surgeon: CRexene Alberts MD;  [facility]  Service: Open Heart Surgery;  Laterality: N/A;  . MITRAL VALVE REPLACEMENT  01/26/2012   Procedure: MINIMALLY INVASIVE MITRAL VALVE (MV) REPLACEMENT;  Surgeon: CRexene Alberts MD;  [facility]  Service: Open Heart Surgery;  Laterality: Right;  . TEE WITHOUT CARDIOVERSION  01/19/2012   Procedure: TRANSESOPHAGEAL ECHOCARDIOGRAM (TEE);  Surgeon: Peter M JMartinique MD;  [facility];  Service: Cardiovascular;  Laterality: N/A;  . TONSILLECTOMY  1970  . [place] . WRIST SURGERY Right 2012  There were no vitals filed for this visit.      Subjective Assessment - 02/09/17 1542    Subjective (P)  Went and got 3/8" heel lift for Rt shoe. She feels it is helping her wak more upright. Next week she will see Dr. Cyndy Freeze (neurosurgeon re: ?spinal brace) and later will have MRI of neck and brain (per Dr. Tomi Likens for her MS)   Pertinent History (P)  MS (neurogenic bladder), first degree heart block, hx of R breast CA, a-fib, arthritis ([place]), CHF, pt is on coumadin, hypothyroidism, scoliosis   Limitations (P)  Standing    How long can you stand comfortably? (P)  1-2 minutes max   Patient Stated Goals (P)  Stand up straight and walk with very little assistance (with a cane vs. rollator)    Currently in Pain? (P)  Yes   Pain Score (P)  2    Pain Location (P)  Back   Pain Descriptors / Indicators (P)  Aching   Pain Type (P)  Chronic pain   Pain Onset (P)  More than a month ago   Pain Frequency (P)  Intermittent                         [facility] Adult PT Treatment/Exercise - 02/09/17 1551      Transfers   Sit to Stand 6: Modified independent (Device/Increase time);With upper extremity assist;From bed;From chair/3-in-1   Stand to Sit 6: Modified independent (Device/Increase time);With upper extremity assist;With armrests;To chair/3-in-1;To bed   Number of Reps --  5     Ambulation/Gait   Ambulation/Gait Assistance 5: Supervision   Ambulation/Gait Assistance Details with rollator, modified independent if not making turns, however continues to step outside base of rollator   Ambulation Distance (Feet) 100 Feet  30   Assistive device Rollator   Gait Pattern Step-through pattern;Decreased stride length;Lateral trunk lean to right;Trunk flexed;Right foot flat;Left foot flat;Poor foot clearance - left;Poor foot clearance - right  stride length improved, bil foot clearance improved   Ambulation Surface Level;Indoor     Berg Balance Test   Sit to Stand Able to stand  independently using hands   Standing Unsupported Able to stand safely 2 minutes   Sitting with Back Unsupported but Feet Supported on Floor or Stool Able to sit safely and securely 2 minutes   Stand to Sit Controls descent by using hands   Transfers Able to transfer safely, minor use of hands   Standing Unsupported with Eyes Closed Able to stand 10 seconds safely   Standing Ubsupported with Feet Together Needs help to attain position but able to stand for 30 seconds with feet together   From Standing, Reach Forward with  Outstretched Arm Can reach forward >12 cm safely (5")   From Standing Position, Pick up Object from Floor Able to pick up shoe safely and easily   From Standing Position, Turn to Look Behind Over each Shoulder Turn sideways only but maintains balance   Turn 360 Degrees Needs close supervision or verbal cueing   Standing Unsupported, Alternately Place Feet on Step/Stool Able to complete >2 steps/needs minimal assist   Standing Unsupported, One Foot in Front Needs help to step but can hold 15 seconds   Standing on One Leg Unable to try or needs assist to prevent fall   Total Score 35     Exercises   Other Exercises  Standing with right hip against wall, reaching LUE up and  overhead to try to touch wall on her right (goal to stretch Rt convex scoliosis curve) x 30 sec, x 15 sec                PT Education - 02/09/17 1646    Education provided Yes   Education Details results of Curator) Educated Patient   Methods Explanation   Comprehension Verbalized understanding          PT Short Term Goals - 02/02/17 1750      PT SHORT TERM GOAL #1   Title Pt will be IND in progressed HEP to improve posture, balance, strength, and flexibility. TARGET DATE FOR ALL STGS: 01/01/17; Met 4/17   Status Achieved     PT SHORT TERM GOAL #2   Title Pt will improve TUG time with LRAD to </=20 sec. to decr. falls risk.    Baseline 3/15  40.69 seconds   Status Not Met     PT SHORT TERM GOAL #3   Title Perform BERG and DGI and write goals as indicated.    Baseline 2/27 BERG 26/56   Status Achieved     PT SHORT TERM GOAL #4   Title Pt will improve gait speed to >/=1.42f/sec. to decr. falls risk. Met 4/17 when LTGs assessed   Status Achieved     PT SHORT TERM GOAL #5   Title Pt will amb. 300' over even terrain with LRAD at MOD I level to improve functionla mobility. Met 4/17 when LTGs assessed   Status Achieved     Additional Short Term Goals   Additional Short Term  Goals Yes     PT SHORT TERM GOAL #6   Title Set 02/02/17  Patient will perform TUG <= 25 seconds. TARGET date 02/27/16   Time 3   Period Weeks   Status New     PT SHORT TERM GOAL #7   Title Patient will improve gait velocity to 2.40 ft/sec as progressing to gait velocity required for safe community ambulation. TARGET date 02/27/16   Time 3   Period Weeks   Status New     PT SHORT TERM GOAL #8   Title Patient will be independent with HEP exercises specifically for posture. TARGET date 02/27/16   Time 3   Period Weeks           PT Long Term Goals - 02/09/17 1659      PT LONG TERM GOAL #1   Title Pt will improve gait speed with LRAD to >/=2.613fsec to safely amb. in the community. TARGET DATE FOR ALL LTGS: 01/29/17  **Revised 12/31/16 with goal of 1.25 ft/sec   Baseline 2/16 1.17 ft/sec; 3/15 0.7589fec; 4/17  2.24 ft/sec   Time 4   Period Weeks   Status Achieved     PT LONG TERM GOAL #2   Title Pt will amb. 500' over even/uneven terrain with LRAD at MOD I level to improve functional mobility. 4/17 364 ft (continue goal-TARGET 03/18/17)   Status On-going     PT LONG TERM GOAL #3   Title Pt will amb. 150' with SPC at MOD I level (indoors) in order to safely amb. at home. 4/3 deferred per discussion with patient (currently does not feel is realistic goal and agrees to defer)   Status Deferred     PT LONG TERM GOAL #4   Title Pt will perform TUG with LRAD in </=13.5sec. to decr. falls risk. **Revised 3/15--decrease TUG to <30  seconds to indicate lesser fall risk; 02/02/17 TUG <20 seconds (TARGET 03/18/17)   Baseline 4/17 30.91    Time 6  updated 4/17   Period Weeks   Status On-going     PT LONG TERM GOAL #5   Title Patient will demostrate reduced risk of falling reflected by incr Berg Balance Assessment to 39/56. (TARGET 03/18/17)   Baseline 4/24 35/56   Time 6  updated 4/24   Period Weeks   Status On-going     PT LONG TERM GOAL #6   Title Patient will ambulate with gait  velocity >= 2.62 ft/sec for safe community ambulation. TARGET 03/18/17   Time 6   Period Weeks   Status New     PT LONG TERM GOAL #7   Title Patient will verbalize plan for continued exercise/activity in community upon discharge from PT.  TARGET 03/18/17   Time 6   Period Weeks   Status New               Plan - 02/09/17 1648    Clinical Impression Statement Patient walking into clinic with more upright posture, better stride length and better foot clearance. She reports she has felt more upright since adding heel lift to Rt shoe. Session focused on balance re-assessment Merrilee Jansky) with pt's score increasing from 26/56 to 35/56 (with 7 points being the minimal detectable change). Initiated additional spinal stretches for improving upright posture. Patient pleased with progress and plans to return after several medical/MD appts next week.    Rehab Potential Good   Clinical Impairments Affecting Rehab Potential scoliosis, MS   PT Frequency 2x / week   PT Duration 6 weeks  re-certification 5/36 for 6 weeks   PT Treatment/Interventions ADLs/Self Care Home Management;Biofeedback;Canalith Repostioning;Electrical Stimulation;Neuromuscular re-education;Balance training;Therapeutic exercise;Therapeutic activities;Manual techniques;Functional mobility training;Stair training;Gait training;DME Instruction;Patient/family education;Orthotic Fit/Training;Passive range of motion   PT Next Visit Plan ask about results of neurosurgery appt (?brace); leg press; Nu-step; SLS and balance; tandem; spinal stretches for thoracic Rt convex scoliosis; postural corrections with standing mirror   Consulted and Agree with Plan of Care Patient      Patient will benefit from skilled therapeutic intervention in order to improve the following deficits and impairments:  Abnormal gait, Decreased endurance, Decreased knowledge of use of DME, Decreased balance, Decreased mobility, Decreased range of motion, Postural  dysfunction, Impaired flexibility, Pain, Decreased strength  Visit Diagnosis: Other abnormalities of gait and mobility  Muscle weakness (generalized)  Abnormal posture     Problem List Patient Active Problem List   Diagnosis Date Noted  . Scoliosis 11/20/2016  . Long term current use of anticoagulant 09/18/2016  . Compression fracture of lumbar spine, non-traumatic, sequela 08/11/2016  . Multiple falls 08/11/2016  . At high risk for injury related to fall 08/11/2016  . Encounter for therapeutic drug monitoring 11/27/2013  . Hypothyroidism   . CHF (congestive heart failure) ([place])   . Paroxysmal atrial fibrillation ([facility])   . Arthritis   . Neurogenic bladder   . Warfarin anticoagulation   . First degree heart block 01/29/2012  . 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 S/P Maze operation for atrial fibrillation 01/26/2012  . Anxiety 01/18/2012  . Multiple sclerosis ([place]) 01/19/2007    Rexanne Mano, PT 02/09/2017, 5:08 PM  [facility] [address] [place], [place], [address] Phone: [phone]   Fax:  [phone]  Name: [name] [name] MRN: [id number] Date of Birth: [birth year]

## 2017-02-16 ENCOUNTER — Ambulatory Visit: Payer: Medicare Other | Admitting: Neurology

## 2017-02-16 DIAGNOSIS — R030 Elevated blood-pressure reading, without diagnosis of hypertension: Secondary | ICD-10-CM | POA: Diagnosis not present

## 2017-02-16 DIAGNOSIS — M549 Dorsalgia, unspecified: Secondary | ICD-10-CM | POA: Diagnosis not present

## 2017-02-16 DIAGNOSIS — M4150 Other secondary scoliosis, site unspecified: Secondary | ICD-10-CM | POA: Diagnosis not present

## 2017-02-18 ENCOUNTER — Ambulatory Visit
Admission: RE | Admit: 2017-02-18 | Discharge: 2017-02-18 | Disposition: A | Discharge status: Home / Self Care | Payer: Medicare Other | Source: Ambulatory Visit | Attending: Neurology | Admitting: Neurology

## 2017-02-18 DIAGNOSIS — G35 Multiple sclerosis: Secondary | ICD-10-CM | POA: Diagnosis not present

## 2017-02-18 DIAGNOSIS — E039 Hypothyroidism, unspecified: Secondary | ICD-10-CM | POA: Diagnosis not present

## 2017-02-19 ENCOUNTER — Telehealth: Payer: Self-pay

## 2017-02-19 NOTE — Telephone Encounter (Signed)
-----   Message from Pieter Partridge, DO sent at 02/19/2017  7:07 AM EDT ----- MRI of brain is stable without significant change since 2015.  No MS lesions seen in spinal cord in neck.

## 2017-02-19 NOTE — Telephone Encounter (Signed)
Patient informed of MRI results. 

## 2017-02-23 ENCOUNTER — Encounter: Payer: Self-pay | Admitting: Physical Therapy

## 2017-02-23 ENCOUNTER — Ambulatory Visit: Payer: Medicare Other | Attending: Family Medicine | Admitting: Physical Therapy

## 2017-02-23 DIAGNOSIS — R293 Abnormal posture: Secondary | ICD-10-CM | POA: Insufficient documentation

## 2017-02-23 DIAGNOSIS — R2689 Other abnormalities of gait and mobility: Secondary | ICD-10-CM | POA: Insufficient documentation

## 2017-02-23 DIAGNOSIS — M6281 Muscle weakness (generalized): Secondary | ICD-10-CM | POA: Insufficient documentation

## 2017-02-23 NOTE — Therapy (Signed)
[name] [name] [address] [place], [place], [address] Phone: [phone]   Fax:  [phone]  Physical Therapy Treatment  Patient Details  Name: [name] [name] MRN: [id number] Date of Birth: 05/22/1951 Referring Provider: Dr. Raoul Pitch  Encounter Date: 02/23/2017      PT End of Session - 02/23/17 2200    Visit Number 15  G3   Number of Visits 24  6/72 re-certification for 12 (+12 as of 4/17)= 24   Date for PT Re-Evaluation 02/02/17   Authorization Type G-CODE AND PROGRESS NOTE EVERY 10TH VISIT.    PT Start Time 1406   PT Stop Time 1446   PT Time Calculation (min) 40 min   Equipment Utilized During Treatment Back brace   Activity Tolerance Patient tolerated treatment well   Behavior During Therapy WFL for tasks assessed/performed      Past Medical History:  Diagnosis Date  . Anxiety   . Arthritis    knees  . Breast cancer ([place]) 1999  . CHF (congestive heart failure) ([place])    Related to severe mitral regurgitation, April, 2013  . 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 Assess by pulmonary team in the hospital April, 2013  . Ejection fraction    EF 60%, echo, April, 2013, with severe MR before mitral valve replacement  . Herpes   . Hypothyroidism   . IBS (irritable bowel syndrome)   . Mitral valve regurgitation    Mitral valve replacement April, 2013, Mitral valve prolapse  . Multiple sclerosis ([place])   . Neurogenic bladder   . Osteoporosis   . Ovarian cyst   . Paroxysmal atrial fibrillation (HCC)    Rapid atrial fibrillation in-hospital, Rapid cardioversion,  before mitral valve surgery  . Pulmonary hypertension ([place])    Echo, April, 2013, before mitral valve surgery  . S/P Maze operation for atrial fibrillation 01/26/2012   Complete biatrial lesion set using cryothermy via right mini thoracotomy  . S/P mitral valve replacement 01/26/2012   29m Sorin [employer] Optiform  mechanical prosthesis via right mini thoracotomy  . Warfarin anticoagulation    Mechanical mitral prosthesis, April, 20136    Past Surgical History:  Procedure Laterality Date  . BREAST LUMPECTOMY Right 1999   with sent.node, and axillary dissection (20)  . CHEST TUBE INSERTION  01/26/2012   Procedure: CHEST TUBE INSERTION;  Surgeon: CRexene Alberts MD;  [facility]  Service: Open Heart Surgery;  Laterality: Left;  . COLONOSCOPY  2010   "normal"  . CYSTOSCOPY  1992  . LAPAROSCOPIC OVARIAN CYSTECTOMY  1978   urethral stricture repair  . LEFT AND RIGHT HEART CATHETERIZATION WITH CORONARY ANGIOGRAM N/A 01/20/2012   Procedure: LEFT AND RIGHT HEART CATHETERIZATION WITH CORONARY ANGIOGRAM;  Surgeon: CBurnell Blanks MD;  [facility];  Service: Cardiovascular;  Laterality: N/A;  . LYMPHADENECTOMY    . MAZE  01/26/2012   Procedure: MAZE;  Surgeon: CRexene Alberts MD;  [facility]  Service: Open Heart Surgery;  Laterality: N/A;  . MITRAL VALVE REPLACEMENT  01/26/2012   Procedure: MINIMALLY INVASIVE MITRAL VALVE (MV) REPLACEMENT;  Surgeon: CRexene Alberts MD;  [facility]  Service: Open Heart Surgery;  Laterality: Right;  . TEE WITHOUT CARDIOVERSION  01/19/2012   Procedure: TRANSESOPHAGEAL ECHOCARDIOGRAM (TEE);  Surgeon: Peter M JMartinique MD;  [facility];  Service: Cardiovascular;  Laterality: N/A;  . TONSILLECTOMY  1970  . [place] .  WRIST SURGERY Right 2012    There were no vitals filed for this visit.      Subjective Assessment - 02/23/17 1411    Subjective Saw Dr. Cyndy Freeze and he took 2 xrays of her back and felt her bones were too weak for surgery and he gave her a LSO [employer]). He referred her to an endicronologist to manage her osteoporosis.    Pertinent History MS (neurogenic bladder), first degree heart block, hx of R breast CA, a-fib, arthritis (B Knees), CHF, pt is on coumadin, hypothyroidism, scoliosis    Limitations Standing   How long can you stand comfortably? 1-2 minutes max   Patient Stated Goals Stand up straight and walk with very little assistance (with a cane vs. rollator)    Currently in Pain? Yes   Pain Score 2   with LSO   Pain Location Back   Pain Orientation Lower   Pain Descriptors / Indicators Aching   Pain Type Chronic pain   Pain Onset More than a month ago   Pain Frequency Constant                         [facility] Adult PT Treatment/Exercise - 02/23/17 1418      Transfers   Sit to Stand 6: Modified independent (Device/Increase time)   Stand to Sit 6: Modified independent (Device/Increase time)     Ambulation/Gait   Ambulation/Gait Assistance 5: Supervision   Ambulation/Gait Assistance Details with rollator; vc for upright posture and staying close to the rollator    Ambulation Distance (Feet) 720 Feet   Assistive device Rollator  with LSO   Gait Pattern Step-through pattern;Decreased stride length;Lateral trunk lean to right;Trunk flexed;Right foot flat;Left foot flat;Poor foot clearance - left;Poor foot clearance - right   Ambulation Surface Level;Indoor   Gait Comments Patient disappointed that she feels she will not be able to progress to cane in time for her August class reunion.      Posture/Postural Control   Posture/Postural Control Postural limitations   Postural Limitations Rounded Shoulders;Forward head;Weight shift right  scoliosis   Posture Comments Scoliosis (R tx convex and L Lx convex) causes pt to experience R trunk lean     Exercises   Other Exercises  Standing with left hip against wall (despite wide BOS, pt did not feel steady enough to have lateral left foot also against wall with foot 6 inches from wall); RUE reaching overhead towrds the wall; 2 reps x 10 sec (pt with difficulty holding longer due to weakness                PT Education - 02/23/17 2158    Education provided Yes   Education Details Educated on  potential risk of increasing trunk weakness if wearing brace constantly. She does get back relief and discussed using when working on walking distance/endurance and when "hving a bad day" with regards to back pain.   Person(s) Educated Patient   Methods Explanation   Comprehension Verbalized understanding          PT Short Term Goals - 02/02/17 1750      PT SHORT TERM GOAL #1   Title Pt will be IND in progressed HEP to improve posture, balance, strength, and flexibility. TARGET DATE FOR ALL STGS: 01/01/17; Met 4/17   Status Achieved     PT SHORT TERM GOAL #2   Title Pt will improve TUG time with LRAD to </=20 sec. to decr.  falls risk.    Baseline 3/15  40.69 seconds   Status Not Met     PT SHORT TERM GOAL #3   Title Perform BERG and DGI and write goals as indicated.    Baseline 2/27 BERG 26/56   Status Achieved     PT SHORT TERM GOAL #4   Title Pt will improve gait speed to >/=1.47f/sec. to decr. falls risk. Met 4/17 when LTGs assessed   Status Achieved     PT SHORT TERM GOAL #5   Title Pt will amb. 300' over even terrain with LRAD at MOD I level to improve functionla mobility. Met 4/17 when LTGs assessed   Status Achieved     Additional Short Term Goals   Additional Short Term Goals Yes     PT SHORT TERM GOAL #6   Title Set 02/02/17  Patient will perform TUG <= 25 seconds. TARGET date 02/27/16   Time 3   Period Weeks   Status New     PT SHORT TERM GOAL #7   Title Patient will improve gait velocity to 2.40 ft/sec as progressing to gait velocity required for safe community ambulation. TARGET date 02/27/16   Time 3   Period Weeks   Status New     PT SHORT TERM GOAL #8   Title Patient will be independent with HEP exercises specifically for posture. TARGET date 02/27/16   Time 3   Period Weeks           PT Long Term Goals - 02/09/17 1659      PT LONG TERM GOAL #1   Title Pt will improve gait speed with LRAD to >/=2.661fsec to safely amb. in the community. TARGET  DATE FOR ALL LTGS: 01/29/17  **Revised 12/31/16 with goal of 1.25 ft/sec   Baseline 2/16 1.17 ft/sec; 3/15 0.758fec; 4/17  2.24 ft/sec   Time 4   Period Weeks   Status Achieved     PT LONG TERM GOAL #2   Title Pt will amb. 500' over even/uneven terrain with LRAD at MOD I level to improve functional mobility. 4/17 364 ft (continue goal-TARGET 03/18/17)   Status On-going     PT LONG TERM GOAL #3   Title Pt will amb. 150' with SPC at MOD I level (indoors) in order to safely amb. at home. 4/3 deferred per discussion with patient (currently does not feel is realistic goal and agrees to defer)   Status Deferred     PT LONG TERM GOAL #4   Title Pt will perform TUG with LRAD in </=13.5sec. to decr. falls risk. **Revised 3/15--decrease TUG to <30 seconds to indicate lesser fall risk; 02/02/17 TUG <20 seconds (TARGET 03/18/17)   Baseline 4/17 30.91    Time 6  updated 4/17   Period Weeks   Status On-going     PT LONG TERM GOAL #5   Title Patient will demostrate reduced risk of falling reflected by incr Berg Balance Assessment to 39/56. (TARGET 03/18/17)   Baseline 4/24 35/56   Time 6  updated 4/24   Period Weeks   Status On-going     PT LONG TERM GOAL #6   Title Patient will ambulate with gait velocity >= 2.62 ft/sec for safe community ambulation. TARGET 03/18/17   Time 6   Period Weeks   Status New     PT LONG TERM GOAL #7   Title Patient will verbalize plan for continued exercise/activity in community upon discharge from PT.  TARGET 03/18/17  Time 6   Period Weeks   Status New               Plan - 02/23/17 2201    Clinical Impression Statement Session focused on education re: use of new LSO, gait training, and stretching. Patient was unable to attend PT last week due to multiple tests, MD appointments (neurosurgery, 2 MRI's). Will continue to benefit from skilled PT to progress towards goals.    Rehab Potential Good   Clinical Impairments Affecting Rehab Potential  scoliosis, MS   PT Frequency 2x / week   PT Duration 6 weeks  re-certification 4/62 for 6 weeks   PT Treatment/Interventions ADLs/Self Care Home Management;Biofeedback;Canalith Repostioning;Electrical Stimulation;Neuromuscular re-education;Balance training;Therapeutic exercise;Therapeutic activities;Manual techniques;Functional mobility training;Stair training;Gait training;DME Instruction;Patient/family education;Orthotic Fit/Training;Passive range of motion   PT Next Visit Plan spinal stretches for thoracic Rt convex and lumbar left convex scoliosis; leg press; Nu-step; SLS and balance; tandem; gait training (? use 2 wheel RW during PT, not her rollator)   Consulted and Agree with Plan of Care Patient      Patient will benefit from skilled therapeutic intervention in order to improve the following deficits and impairments:  Abnormal gait, Decreased endurance, Decreased knowledge of use of DME, Decreased balance, Decreased mobility, Decreased range of motion, Postural dysfunction, Impaired flexibility, Pain, Decreased strength  Visit Diagnosis: Other abnormalities of gait and mobility  Muscle weakness (generalized)  Abnormal posture     Problem List Patient Active Problem List   Diagnosis Date Noted  . Scoliosis 11/20/2016  . Long term current use of anticoagulant 09/18/2016  . Compression fracture of lumbar spine, non-traumatic, sequela 08/11/2016  . Multiple falls 08/11/2016  . At high risk for injury related to fall 08/11/2016  . Encounter for therapeutic drug monitoring 11/27/2013  . Hypothyroidism   . CHF (congestive heart failure) ([place])   . Paroxysmal atrial fibrillation (HCC)   . Arthritis   . Neurogenic bladder   . Warfarin anticoagulation   . First degree heart block 01/29/2012  . 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 S/P Maze operation for atrial fibrillation 01/26/2012  . Anxiety 01/18/2012  . Multiple sclerosis ([place]) 01/19/2007    Rexanne Mano,  PT 02/23/2017, 10:10 PM  [facility] [address] [place], [place], [address] Phone: [phone]   Fax:  [phone]  Name: NYJA WESTBROOK MRN: [id number] Date of Birth: 1951-09-02

## 2017-02-25 ENCOUNTER — Ambulatory Visit: Payer: Medicare Other | Admitting: Physical Therapy

## 2017-03-02 ENCOUNTER — Encounter: Payer: Self-pay | Admitting: Physical Therapy

## 2017-03-02 ENCOUNTER — Ambulatory Visit: Payer: Medicare Other | Admitting: Physical Therapy

## 2017-03-02 DIAGNOSIS — R2689 Other abnormalities of gait and mobility: Secondary | ICD-10-CM

## 2017-03-02 DIAGNOSIS — R293 Abnormal posture: Secondary | ICD-10-CM | POA: Diagnosis not present

## 2017-03-02 DIAGNOSIS — M6281 Muscle weakness (generalized): Secondary | ICD-10-CM | POA: Diagnosis not present

## 2017-03-02 NOTE — Therapy (Signed)
[facility] [address] [place], [place], [address] Phone: [phone]   Fax:  [phone]  Physical Therapy Treatment  Patient Details  Name: [name] [name] MRN: [id number] Date of Birth: 1951-08-27 Referring Provider: Dr. Raoul Pitch  Encounter Date: 03/02/2017      PT End of Session - 03/02/17 1636    Visit Number 16  G4   Number of Visits 24  3/87 re-certification for 12 (+12 as of 4/17)= 24   Date for PT Re-Evaluation 03/18/17   Authorization Type G-CODE AND PROGRESS NOTE EVERY 10TH VISIT.    PT Start Time 1402   PT Stop Time 1446   PT Time Calculation (min) 44 min   Equipment Utilized During Treatment Back brace;Gait belt   Activity Tolerance Patient tolerated treatment well   Behavior During Therapy WFL for tasks assessed/performed      Past Medical History:  Diagnosis Date  . Anxiety   . Arthritis    knees  . Breast cancer ([place]) 1999  . CHF (congestive heart failure) ([place])    Related to severe mitral regurgitation, April, 2013  . 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 Assess by pulmonary team in the hospital April, 2013  . Ejection fraction    EF 60%, echo, April, 2013, with severe MR before mitral valve replacement  . Herpes   . Hypothyroidism   . IBS (irritable bowel syndrome)   . Mitral valve regurgitation    Mitral valve replacement April, 2013, Mitral valve prolapse  . Multiple sclerosis ([place])   . Neurogenic bladder   . Osteoporosis   . Ovarian cyst   . Paroxysmal atrial fibrillation (HCC)    Rapid atrial fibrillation in-hospital, Rapid cardioversion,  before mitral valve surgery  . Pulmonary hypertension ([place])    Echo, April, 2013, before mitral valve surgery  . S/P Maze operation for atrial fibrillation 01/26/2012   Complete biatrial lesion set using cryothermy via right mini thoracotomy  . S/P mitral valve replacement 01/26/2012   65m Sorin [employer]  Optiform mechanical prosthesis via right mini thoracotomy  . Warfarin anticoagulation    Mechanical mitral prosthesis, April, 20136    Past Surgical History:  Procedure Laterality Date  . BREAST LUMPECTOMY Right 1999   with sent.node, and axillary dissection (20)  . CHEST TUBE INSERTION  01/26/2012   Procedure: CHEST TUBE INSERTION;  Surgeon: CRexene Alberts MD;  [facility]  Service: Open Heart Surgery;  Laterality: Left;  . COLONOSCOPY  2010   "normal"  . CYSTOSCOPY  1992  . LAPAROSCOPIC OVARIAN CYSTECTOMY  1978   urethral stricture repair  . LEFT AND RIGHT HEART CATHETERIZATION WITH CORONARY ANGIOGRAM N/A 01/20/2012   Procedure: LEFT AND RIGHT HEART CATHETERIZATION WITH CORONARY ANGIOGRAM;  Surgeon: CBurnell Blanks MD;  [facility];  Service: Cardiovascular;  Laterality: N/A;  . LYMPHADENECTOMY    . MAZE  01/26/2012   Procedure: MAZE;  Surgeon: CRexene Alberts MD;  [facility]  Service: Open Heart Surgery;  Laterality: N/A;  . MITRAL VALVE REPLACEMENT  01/26/2012   Procedure: MINIMALLY INVASIVE MITRAL VALVE (MV) REPLACEMENT;  Surgeon: CRexene Alberts MD;  [facility]  Service: Open Heart Surgery;  Laterality: Right;  . TEE WITHOUT CARDIOVERSION  01/19/2012   Procedure: TRANSESOPHAGEAL ECHOCARDIOGRAM (TEE);  Surgeon: Peter M JMartinique MD;  [facility];  Service: Cardiovascular;  Laterality: N/A;  . TONSILLECTOMY  1970  . UMBILICAL HERNIA REPAIR  37  . WRIST SURGERY Right 2012    There were no vitals filed for this visit.      Subjective Assessment - 03/02/17 1403    Subjective Back is hurting more after cooking stew on Saturday evening. Wearing brace.    Pertinent History MS (neurogenic bladder), first degree heart block, hx of R breast CA, a-fib, arthritis (B Knees), CHF, pt is on coumadin, hypothyroidism, scoliosis   Limitations Standing   How long can you stand comfortably? 1-2 minutes max   Patient Stated Goals Stand up straight and  walk with very little assistance (with a cane vs. rollator)    Currently in Pain? Yes   Pain Score 3    Pain Location Back   Pain Orientation Lower   Pain Descriptors / Indicators Aching   Pain Type Chronic pain   Pain Onset More than a month ago   Pain Frequency Intermittent                         [facility] Adult PT Treatment/Exercise - 03/02/17 1600      Transfers   Sit to Stand 6: Modified independent (Device/Increase time);4: Min guard;With upper extremity assist  with rollator vs straight cane   Stand to Sit 6: Modified independent (Device/Increase time);4: Min guard;With upper extremity assist     Ambulation/Gait   Ambulation/Gait Assistance 4: Min guard   Ambulation/Gait Assistance Details with SPC with quad rubber tip; pt more upright in sagittal plane; right lateral shift slightly improved with use of cane;   Ambulation Distance (Feet) 45 Feet  25, 25   Assistive device Rollator;Straight cane   Gait Pattern Step-to pattern;Decreased step length - left;Decreased step length - right;Decreased weight shift to left;Right foot flat;Left foot flat;Right flexed knee in stance;Lateral trunk lean to right;Wide base of support   Ambulation Surface Level;Indoor     Posture/Postural Control   Posture/Postural Control Postural limitations   Postural Limitations Rounded Shoulders;Forward head;Weight shift right   Posture Comments Scoliosis (R tx convex and L Lx convex) causes pt to experience R trunk lean; seated stretch RUE reaching up and sideways with weight shift onto Rt pelvis with left hip hike/shortening of left side x 5 sec hold x 5 reps with rest breaks; used cane for LUE to assist RUE in upward/lateral reach; left hip against counter/lower cabinets with RUE reaching up and over to touch upper cabinet with shortening left side             Balance Exercises - 03/02/17 1608      Balance Exercises: Standing   SLS Eyes open;Solid surface;Upper extremity support  1;5 reps;20 secs  bil; focus on leveling pelvis and upright torso           PT Education - 03/02/17 1635    Education provided Yes   Education Details addition to [employer]) Educated Patient   Methods Explanation;Demonstration;Verbal cues;Handout   Comprehension Verbalized understanding;Returned demonstration;Verbal cues required;Need further instruction          PT Short Term Goals - 02/02/17 1750      PT SHORT TERM GOAL #1   Title Pt will be IND in progressed HEP to improve posture, balance, strength, and flexibility. TARGET DATE FOR ALL STGS: 01/01/17; Met 4/17   Status Achieved     PT SHORT TERM GOAL #2   Title Pt will improve TUG time with LRAD to </=20 sec. to decr. falls risk.    Baseline  3/15  40.69 seconds   Status Not Met     PT SHORT TERM GOAL #3   Title Perform BERG and DGI and write goals as indicated.    Baseline 2/27 BERG 26/56   Status Achieved     PT SHORT TERM GOAL #4   Title Pt will improve gait speed to >/=1.49f/sec. to decr. falls risk. Met 4/17 when LTGs assessed   Status Achieved     PT SHORT TERM GOAL #5   Title Pt will amb. 300' over even terrain with LRAD at MOD I level to improve functionla mobility. Met 4/17 when LTGs assessed   Status Achieved     Additional Short Term Goals   Additional Short Term Goals Yes     PT SHORT TERM GOAL #6   Title Set 02/02/17  Patient will perform TUG <= 25 seconds. TARGET date 02/27/16   Time 3   Period Weeks   Status New     PT SHORT TERM GOAL #7   Title Patient will improve gait velocity to 2.40 ft/sec as progressing to gait velocity required for safe community ambulation. TARGET date 02/27/16   Time 3   Period Weeks   Status New     PT SHORT TERM GOAL #8   Title Patient will be independent with HEP exercises specifically for posture. TARGET date 02/27/16   Time 3   Period Weeks           PT Long Term Goals - 02/09/17 1659      PT LONG TERM GOAL #1   Title Pt will improve gait speed  with LRAD to >/=2.669fsec to safely amb. in the community. TARGET DATE FOR ALL LTGS: 01/29/17  **Revised 12/31/16 with goal of 1.25 ft/sec   Baseline 2/16 1.17 ft/sec; 3/15 0.7539fec; 4/17  2.24 ft/sec   Time 4   Period Weeks   Status Achieved     PT LONG TERM GOAL #2   Title Pt will amb. 500' over even/uneven terrain with LRAD at MOD I level to improve functional mobility. 4/17 364 ft (continue goal-TARGET 03/18/17)   Status On-going     PT LONG TERM GOAL #3   Title Pt will amb. 150' with SPC at MOD I level (indoors) in order to safely amb. at home. 4/3 deferred per discussion with patient (currently does not feel is realistic goal and agrees to defer)   Status Deferred     PT LONG TERM GOAL #4   Title Pt will perform TUG with LRAD in </=13.5sec. to decr. falls risk. **Revised 3/15--decrease TUG to <30 seconds to indicate lesser fall risk; 02/02/17 TUG <20 seconds (TARGET 03/18/17)   Baseline 4/17 30.91    Time 6  updated 4/17   Period Weeks   Status On-going     PT LONG TERM GOAL #5   Title Patient will demostrate reduced risk of falling reflected by incr Berg Balance Assessment to 39/56. (TARGET 03/18/17)   Baseline 4/24 35/56   Time 6  updated 4/24   Period Weeks   Status On-going     PT LONG TERM GOAL #6   Title Patient will ambulate with gait velocity >= 2.62 ft/sec for safe community ambulation. TARGET 03/18/17   Time 6   Period Weeks   Status New     PT LONG TERM GOAL #7   Title Patient will verbalize plan for continued exercise/activity in community upon discharge from PT.  TARGET 03/18/17   Time 6   Period  Weeks   Status New               Plan - 03/02/17 1639    Clinical Impression Statement Session focused on ROM and strengthening torso for upright posture to improve balance and gait. Gait training with cane initiated with pt doing well (at most required minimal assist). Patient reported she began feeling this weekend that her legs are stronger and she  trusts them to support her. Next session will assess progress towards goals.    Rehab Potential Good   Clinical Impairments Affecting Rehab Potential scoliosis, MS   PT Frequency 2x / week   PT Duration 6 weeks  re-certification 9/92 for 6 weeks   PT Treatment/Interventions ADLs/Self Care Home Management;Biofeedback;Canalith Repostioning;Electrical Stimulation;Neuromuscular re-education;Balance training;Therapeutic exercise;Therapeutic activities;Manual techniques;Functional mobility training;Stair training;Gait training;DME Instruction;Patient/family education;Orthotic Fit/Training;Passive range of motion   PT Next Visit Plan check latest STGs (due 5/18); spinal stretches for thoracic Rt convex and lumbar left convex scoliosis; leg press; Nu-step; SLS and balance; tandem; gait training with cane/quad tip   Consulted and Agree with Plan of Care Patient      Patient will benefit from skilled therapeutic intervention in order to improve the following deficits and impairments:  Abnormal gait, Decreased endurance, Decreased knowledge of use of DME, Decreased balance, Decreased mobility, Decreased range of motion, Postural dysfunction, Impaired flexibility, Pain, Decreased strength  Visit Diagnosis: Other abnormalities of gait and mobility  Muscle weakness (generalized)  Abnormal posture     Problem List Patient Active Problem List   Diagnosis Date Noted  . Scoliosis 11/20/2016  . Long term current use of anticoagulant 09/18/2016  . Compression fracture of lumbar spine, non-traumatic, sequela 08/11/2016  . Multiple falls 08/11/2016  . At high risk for injury related to fall 08/11/2016  . Encounter for therapeutic drug monitoring 11/27/2013  . Hypothyroidism   . CHF (congestive heart failure) ([place])   . Paroxysmal atrial fibrillation (HCC)   . Arthritis   . Neurogenic bladder   . Warfarin anticoagulation   . First degree heart block 01/29/2012  . S/P mitral valve replacement  01/26/2012  . S/P Maze operation for atrial fibrillation 01/26/2012  . Anxiety 01/18/2012  . Multiple sclerosis ([place]) 01/19/2007    [name] [name], PT 03/02/2017, 4:50 PM  [facility] [address] [place], [place], [address] Phone: [phone]   Fax:  [phone]  Name: [name] [name] MRN: [id number] Date of Birth: 09/23/1951

## 2017-03-02 NOTE — Patient Instructions (Addendum)
"  I love a Merchandiser, retail with left hand on counter. Lift left leg off the floor to stand on right leg only. Concentrate on shortening your left side and bringing shoulders to your left. Hold for up to 30 seconds. Repeat _5___ times. Do __1__ sessions per day.  [email or web address].exer.[email or web address]   Copyright  VHI. All rights reserved.

## 2017-03-04 ENCOUNTER — Ambulatory Visit: Payer: Medicare Other | Admitting: Physical Therapy

## 2017-03-04 ENCOUNTER — Encounter: Payer: Self-pay | Admitting: Physical Therapy

## 2017-03-04 DIAGNOSIS — R293 Abnormal posture: Secondary | ICD-10-CM

## 2017-03-04 DIAGNOSIS — M6281 Muscle weakness (generalized): Secondary | ICD-10-CM

## 2017-03-04 DIAGNOSIS — R2689 Other abnormalities of gait and mobility: Secondary | ICD-10-CM

## 2017-03-04 NOTE — Therapy (Signed)
[facility] [address] [place], [place], [address] Phone: [phone]   Fax:  [phone]  Physical Therapy Treatment  Patient Details  Name: [name] [name] MRN: [id number] Date of Birth: 29-Jun-1951 Referring Provider: Dr. Raoul Pitch  Encounter Date: 03/04/2017      PT End of Session - 03/04/17 1821    Visit Number 17  G5   Number of Visits 24  9/37 re-certification for 12 (+12 as of 4/17)= 24   Date for PT Re-Evaluation 03/18/17   Authorization Type G-CODE AND PROGRESS NOTE EVERY 10TH VISIT.    PT Start Time 1400   PT Stop Time 1440   PT Time Calculation (min) 40 min   Activity Tolerance Patient tolerated treatment well   Behavior During Therapy WFL for tasks assessed/performed      Past Medical History:  Diagnosis Date  . Anxiety   . Arthritis    knees  . Breast cancer ([place]) 1999  . CHF (congestive heart failure) ([place])    Related to severe mitral regurgitation, April, 2013  . 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 Assess by pulmonary team in the hospital April, 2013  . Ejection fraction    EF 60%, echo, April, 2013, with severe MR before mitral valve replacement  . Herpes   . Hypothyroidism   . IBS (irritable bowel syndrome)   . Mitral valve regurgitation    Mitral valve replacement April, 2013, Mitral valve prolapse  . Multiple sclerosis ([place])   . Neurogenic bladder   . Osteoporosis   . Ovarian cyst   . Paroxysmal atrial fibrillation (HCC)    Rapid atrial fibrillation in-hospital, Rapid cardioversion,  before mitral valve surgery  . Pulmonary hypertension ([place])    Echo, April, 2013, before mitral valve surgery  . S/P Maze operation for atrial fibrillation 01/26/2012   Complete biatrial lesion set using cryothermy via right mini thoracotomy  . S/P mitral valve replacement 01/26/2012   22m Sorin [employer] Optiform mechanical prosthesis via right mini thoracotomy   . Warfarin anticoagulation    Mechanical mitral prosthesis, April, 20136    Past Surgical History:  Procedure Laterality Date  . BREAST LUMPECTOMY Right 1999   with sent.node, and axillary dissection (20)  . CHEST TUBE INSERTION  01/26/2012   Procedure: CHEST TUBE INSERTION;  Surgeon: CRexene Alberts MD;  [facility]  Service: Open Heart Surgery;  Laterality: Left;  . COLONOSCOPY  2010   "normal"  . CYSTOSCOPY  1992  . LAPAROSCOPIC OVARIAN CYSTECTOMY  1978   urethral stricture repair  . LEFT AND RIGHT HEART CATHETERIZATION WITH CORONARY ANGIOGRAM N/A 01/20/2012   Procedure: LEFT AND RIGHT HEART CATHETERIZATION WITH CORONARY ANGIOGRAM;  Surgeon: CBurnell Blanks MD;  [facility];  Service: Cardiovascular;  Laterality: N/A;  . LYMPHADENECTOMY    . MAZE  01/26/2012   Procedure: MAZE;  Surgeon: CRexene Alberts MD;  [facility]  Service: Open Heart Surgery;  Laterality: N/A;  . MITRAL VALVE REPLACEMENT  01/26/2012   Procedure: MINIMALLY INVASIVE MITRAL VALVE (MV) REPLACEMENT;  Surgeon: CRexene Alberts MD;  [facility]  Service: Open Heart Surgery;  Laterality: Right;  . TEE WITHOUT CARDIOVERSION  01/19/2012   Procedure: TRANSESOPHAGEAL ECHOCARDIOGRAM (TEE);  Surgeon: Peter M JMartinique MD;  [facility];  Service: Cardiovascular;  Laterality: N/A;  . TONSILLECTOMY  1970  . [place] . WRIST SURGERY Right 2012  There were no vitals filed for this visit.      Subjective Assessment - 03/04/17 1357    Subjective Back is some better, mostly hurting on "Rt haunch." Saw trainer yesterday and did seated exercises. feels she is getting stronger and walking better.   Pertinent History MS (neurogenic bladder), first degree heart block, hx of R breast CA, a-fib, arthritis (B Knees), CHF, pt is on coumadin, hypothyroidism, scoliosis   Limitations Standing   How long can you stand comfortably? 1-2 minutes max   Patient Stated Goals Stand up  straight and walk with very little assistance (with a cane vs. rollator)    Currently in Pain? Yes   Pain Score 3    Pain Location Back   Pain Orientation Right;Lower   Pain Descriptors / Indicators Aching   Pain Type Chronic pain   Pain Onset More than a month ago   Pain Frequency Constant                         [facility] Adult PT Treatment/Exercise - 03/04/17 0001      Transfers   Sit to Stand 6: Modified independent (Device/Increase time);With upper extremity assist;With armrests;From chair/3-in-1   Stand to Sit 6: Modified independent (Device/Increase time);With armrests;With upper extremity assist   Comments to cane vs rollator     Ambulation/Gait   Ambulation/Gait Yes   Ambulation/Gait Assistance 4: Min guard   Ambulation/Gait Assistance Details close guard for safety; no physical assist   Ambulation Distance (Feet) 120 Feet  x 2 (standing rest breaks x2 each lap)   Assistive device Straight cane   Gait Pattern Step-through pattern   Gait velocity 32.8 ft/14.28 sec= 2.30 ft/sec     Posture/Postural Control   Posture/Postural Control Postural limitations   Postural Limitations Rounded Shoulders;Forward head;Weight shift right   Posture Comments Scoliosis (R tx convex and L Lx convex) causes pt to experience R trunk lean; seated stretch RUE reaching up and sideways with weight shift onto Rt pelvis with left hip hike/shortening of left side x 5 sec hold x 5 reps with rest breaks; used cane for LUE to assist RUE in upward/lateral reach     Timed Up and Go Test   Normal TUG (seconds) 25.09                  PT Short Term Goals - 03/04/17 1822      PT SHORT TERM GOAL #1   Title -----     PT SHORT TERM GOAL #2   Title -----     PT SHORT TERM GOAL #3   Title ---     PT SHORT TERM GOAL #4   Title ---     PT SHORT TERM GOAL #5   Title ----     PT SHORT TERM GOAL #6   Title Set 02/02/17  Patient will perform TUG <= 25 seconds. TARGET date  02/27/16   Baseline 5/17  25.09 seconds   Time 3   Period Weeks   Status Achieved     PT SHORT TERM GOAL #7   Title Patient will improve gait velocity to 2.40 ft/sec as progressing to gait velocity required for safe community ambulation. TARGET date 02/27/16   Baseline 5/17 2.30 ft/sec  (did improve, just not to 2.40)   Time 3   Period Weeks   Status Partially Met     PT SHORT TERM GOAL #8   Title Patient will be  independent with HEP exercises specifically for posture. TARGET date 02/27/16   Baseline 5/17   Time 3   Period Weeks   Status Achieved           PT Long Term Goals - 02/09/17 1659      PT LONG TERM GOAL #1   Title Pt will improve gait speed with LRAD to >/=2.54f/sec to safely amb. in the community. TARGET DATE FOR ALL LTGS: 01/29/17  **Revised 12/31/16 with goal of 1.25 ft/sec   Baseline 2/16 1.17 ft/sec; 3/15 0.714fsec; 4/17  2.24 ft/sec   Time 4   Period Weeks   Status Achieved     PT LONG TERM GOAL #2   Title Pt will amb. 500' over even/uneven terrain with LRAD at MOD I level to improve functional mobility. 4/17 364 ft (continue goal-TARGET 03/18/17)   Status On-going     PT LONG TERM GOAL #3   Title Pt will amb. 150' with SPC at MOD I level (indoors) in order to safely amb. at home. 4/3 deferred per discussion with patient (currently does not feel is realistic goal and agrees to defer)   Status Deferred     PT LONG TERM GOAL #4   Title Pt will perform TUG with LRAD in </=13.5sec. to decr. falls risk. **Revised 3/15--decrease TUG to <30 seconds to indicate lesser fall risk; 02/02/17 TUG <20 seconds (TARGET 03/18/17)   Baseline 4/17 30.91    Time 6  updated 4/17   Period Weeks   Status On-going     PT LONG TERM GOAL #5   Title Patient will demostrate reduced risk of falling reflected by incr Berg Balance Assessment to 39/56. (TARGET 03/18/17)   Baseline 4/24 35/56   Time 6  updated 4/24   Period Weeks   Status On-going     PT LONG TERM GOAL #6    Title Patient will ambulate with gait velocity >= 2.62 ft/sec for safe community ambulation. TARGET 03/18/17   Time 6   Period Weeks   Status New     PT LONG TERM GOAL #7   Title Patient will verbalize plan for continued exercise/activity in community upon discharge from PT.  TARGET 03/18/17   Time 6   Period Weeks   Status New               Plan - 03/04/17 1824    Clinical Impression Statement Short-term goals (for past 3 weeks of PT) assessed with pt meeting 2 of 3 goals; gait velocity goal of 2.40 ft/sec was unmet, however she did improve her gait velocity. Continue to focus on postural stretches and strengthening. Gait training with cane with quad tip with significantly improved posture, tolerance for distance, and very little support through cane.    Rehab Potential Good   Clinical Impairments Affecting Rehab Potential scoliosis, MS   PT Frequency 2x / week   PT Duration 6 weeks   PT Treatment/Interventions ADLs/Self Care Home Management;Biofeedback;Canalith Repostioning;Electrical Stimulation;Neuromuscular re-education;Balance training;Therapeutic exercise;Therapeutic activities;Manual techniques;Functional mobility training;Stair training;Gait training;DME Instruction;Patient/family education;Orthotic Fit/Training;Passive range of motion   PT Next Visit Plan spinal stretches for thoracic Rt convex and lumbar left convex scoliosis with cane in sitting; leg press; Nu-step; SLS and balance; tandem; gait training with cane/quad tip   Consulted and Agree with Plan of Care Patient      Patient will benefit from skilled therapeutic intervention in order to improve the following deficits and impairments:  Abnormal gait, Decreased endurance, Decreased knowledge of use of  DME, Decreased balance, Decreased mobility, Decreased range of motion, Postural dysfunction, Impaired flexibility, Pain, Decreased strength  Visit Diagnosis: Other abnormalities of gait and mobility  Muscle  weakness (generalized)  Abnormal posture     Problem List Patient Active Problem List   Diagnosis Date Noted  . Scoliosis 11/20/2016  . Long term current use of anticoagulant 09/18/2016  . Compression fracture of lumbar spine, non-traumatic, sequela 08/11/2016  . Multiple falls 08/11/2016  . At high risk for injury related to fall 08/11/2016  . Encounter for therapeutic drug monitoring 11/27/2013  . Hypothyroidism   . CHF (congestive heart failure) ([place])   . Paroxysmal atrial fibrillation (HCC)   . Arthritis   . Neurogenic bladder   . Warfarin anticoagulation   . First degree heart block 01/29/2012  . 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 S/P Maze operation for atrial fibrillation 01/26/2012  . Anxiety 01/18/2012  . Multiple sclerosis ([place]) 01/19/2007    Rexanne Mano, PT 03/04/2017, 6:29 PM  [facility] [address] [place], [place], [address] Phone: [phone]   Fax:  [phone]  Name: [name] [name] MRN: [id number] Date of Birth: 06-16-1951

## 2017-03-08 ENCOUNTER — Ambulatory Visit (INDEPENDENT_AMBULATORY_CARE_PROVIDER_SITE_OTHER): Payer: Medicare Other | Admitting: *Deleted

## 2017-03-08 DIAGNOSIS — Z5181 Encounter for therapeutic drug level monitoring: Secondary | ICD-10-CM | POA: Diagnosis not present

## 2017-03-08 DIAGNOSIS — Z9889 Other specified postprocedural states: Secondary | ICD-10-CM | POA: Diagnosis not present

## 2017-03-08 DIAGNOSIS — Z952 Presence of prosthetic heart valve: Secondary | ICD-10-CM

## 2017-03-08 DIAGNOSIS — Z8679 Personal history of other diseases of the circulatory system: Secondary | ICD-10-CM

## 2017-03-08 LAB — POCT INR: INR: 2.6

## 2017-03-09 ENCOUNTER — Encounter: Payer: Self-pay | Admitting: Physical Therapy

## 2017-03-09 ENCOUNTER — Ambulatory Visit: Payer: Medicare Other | Admitting: Physical Therapy

## 2017-03-09 DIAGNOSIS — M6281 Muscle weakness (generalized): Secondary | ICD-10-CM | POA: Diagnosis not present

## 2017-03-09 DIAGNOSIS — R2689 Other abnormalities of gait and mobility: Secondary | ICD-10-CM

## 2017-03-09 DIAGNOSIS — R293 Abnormal posture: Secondary | ICD-10-CM | POA: Diagnosis not present

## 2017-03-09 NOTE — Therapy (Signed)
[facility] [address] [place], [place], [address] Phone: [phone]   Fax:  [phone]  Physical Therapy Treatment  Patient Details  Name: [name] [name] MRN: [id number] Date of Birth: 1951-07-13 Referring Provider: Dr. Raoul Pitch  Encounter Date: 03/09/2017      PT End of Session - 03/09/17 2037    Visit Number 18  G6   Number of Visits 24  3/41 re-certification for 12 (+12 as of 4/17)= 24   Date for PT Re-Evaluation 03/18/17   Authorization Type G-CODE AND PROGRESS NOTE EVERY 10TH VISIT.    PT Start Time 1403   PT Stop Time 1444   PT Time Calculation (min) 41 min   Activity Tolerance Patient tolerated treatment well   Behavior During Therapy WFL for tasks assessed/performed      Past Medical History:  Diagnosis Date  . Anxiety   . Arthritis    knees  . Breast cancer ([place]) 1999  . CHF (congestive heart failure) ([place])    Related to severe mitral regurgitation, April, 2013  . 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 Assess by pulmonary team in the hospital April, 2013  . Ejection fraction    EF 60%, echo, April, 2013, with severe MR before mitral valve replacement  . Herpes   . Hypothyroidism   . IBS (irritable bowel syndrome)   . Mitral valve regurgitation    Mitral valve replacement April, 2013, Mitral valve prolapse  . Multiple sclerosis ([place])   . Neurogenic bladder   . Osteoporosis   . Ovarian cyst   . Paroxysmal atrial fibrillation (HCC)    Rapid atrial fibrillation in-hospital, Rapid cardioversion,  before mitral valve surgery  . Pulmonary hypertension ([place])    Echo, April, 2013, before mitral valve surgery  . S/P Maze operation for atrial fibrillation 01/26/2012   Complete biatrial lesion set using cryothermy via right mini thoracotomy  . S/P mitral valve replacement 01/26/2012   62m Sorin [employer] Optiform mechanical prosthesis via right mini thoracotomy   . Warfarin anticoagulation    Mechanical mitral prosthesis, April, 20136    Past Surgical History:  Procedure Laterality Date  . BREAST LUMPECTOMY Right 1999   with sent.node, and axillary dissection (20)  . CHEST TUBE INSERTION  01/26/2012   Procedure: CHEST TUBE INSERTION;  Surgeon: CRexene Alberts MD;  [facility]  Service: Open Heart Surgery;  Laterality: Left;  . COLONOSCOPY  2010   "normal"  . CYSTOSCOPY  1992  . LAPAROSCOPIC OVARIAN CYSTECTOMY  1978   urethral stricture repair  . LEFT AND RIGHT HEART CATHETERIZATION WITH CORONARY ANGIOGRAM N/A 01/20/2012   Procedure: LEFT AND RIGHT HEART CATHETERIZATION WITH CORONARY ANGIOGRAM;  Surgeon: CBurnell Blanks MD;  [facility];  Service: Cardiovascular;  Laterality: N/A;  . LYMPHADENECTOMY    . MAZE  01/26/2012   Procedure: MAZE;  Surgeon: CRexene Alberts MD;  [facility]  Service: Open Heart Surgery;  Laterality: N/A;  . MITRAL VALVE REPLACEMENT  01/26/2012   Procedure: MINIMALLY INVASIVE MITRAL VALVE (MV) REPLACEMENT;  Surgeon: CRexene Alberts MD;  [facility]  Service: Open Heart Surgery;  Laterality: Right;  . TEE WITHOUT CARDIOVERSION  01/19/2012   Procedure: TRANSESOPHAGEAL ECHOCARDIOGRAM (TEE);  Surgeon: Peter M JMartinique MD;  [facility];  Service: Cardiovascular;  Laterality: N/A;  . [place]ONSILLECTOMY  1970  . [place] . WRIST SURGERY Right 2012  There were no vitals filed for this visit.      Subjective Assessment - 03/09/17 1402    Subjective (P)  Did not wear her back brace for 4 days. Feels a little stiff. Wants to know how to stretch her torso so that lt ribs do not protrude.   Pertinent History MS (neurogenic bladder), first degree heart block, hx of R breast CA, a-fib, arthritis (B Knees), CHF, pt is on coumadin, hypothyroidism, scoliosis   Limitations Standing   How long can you stand comfortably? 1-2 minutes max   Patient Stated Goals Stand up straight  and walk with very little assistance (with a cane vs. rollator)    Currently in Pain? (P)  Yes   Pain Score (P)  2    Pain Location (P)  Back   Pain Orientation (P)  Lower   Pain Descriptors / Indicators (P)  Aching   Pain Type (P)  Chronic pain   Pain Onset More than a month ago   Pain Frequency (P)  Intermittent                         [facility] Adult PT Treatment/Exercise - 03/09/17 0001      Transfers   Sit to Stand 6: Modified independent (Device/Increase time)   Stand to Sit 6: Modified independent (Device/Increase time)     Ambulation/Gait   Ambulation/Gait Yes   Ambulation/Gait Assistance 4: Min guard   Ambulation/Gait Assistance Details close guarding while using SPC with small tip; tends to drift to her left, however no LOB   Ambulation Distance (Feet) 240 Feet  120, 120   Assistive device Straight cane   Gait Pattern Step-through pattern;Decreased arm swing - left;Decreased step length - right;Decreased step length - left;Decreased hip/knee flexion - right;Decreased hip/knee flexion - left;Right foot flat;Left foot flat;Shuffle;Lateral hip instability;Lateral trunk lean to right;Trunk rotated posteriorly on right;Trunk flexed;Wide base of support   Ambulation Surface Level;Indoor     Posture/Postural Control   Posture/Postural Control Postural limitations   Postural Limitations Rounded Shoulders;Forward head;Weight shift right   Posture Comments Scoliosis (R tx convex and L Lx convex) causes pt to experience R trunk lean; pt with ribs rotated anteriorly on the left; educated on sitting and standing at counter trunk rotation stretch with rotation to her left; educated on using pillow behind her rt shoulder/ribs when sitting and watching TV                PT Education - 03/09/17 2036    Education provided Yes   Education Details positioning in sitting to create slow, prolonged ROM stretch for trunk/rib rotaiton   Person(s) Educated Patient   Methods  Explanation;Demonstration;Verbal cues   Comprehension Verbalized understanding;Returned demonstration          PT Short Term Goals - 03/04/17 1822      PT SHORT TERM GOAL #1   Title -----     PT SHORT TERM GOAL #2   Title -----     PT SHORT TERM GOAL #3   Title ---     PT SHORT TERM GOAL #4   Title ---     PT SHORT TERM GOAL #5   Title ----     PT SHORT TERM GOAL #6   Title Set 02/02/17  Patient will perform TUG <= 25 seconds. TARGET date 02/27/16   Baseline 5/17  25.09 seconds   Time 3   Period Weeks   Status Achieved  PT SHORT TERM GOAL #7   Title Patient will improve gait velocity to 2.40 ft/sec as progressing to gait velocity required for safe community ambulation. TARGET date 02/27/16   Baseline 5/17 2.30 ft/sec  (did improve, just not to 2.40)   Time 3   Period Weeks   Status Partially Met     PT SHORT TERM GOAL #8   Title Patient will be independent with HEP exercises specifically for posture. TARGET date 02/27/16   Baseline 5/17   Time 3   Period Weeks   Status Achieved           PT Long Term Goals - 02/09/17 1659      PT LONG TERM GOAL #1   Title Pt will improve gait speed with LRAD to >/=2.49ft/sec to safely amb. in the community. TARGET DATE FOR ALL LTGS: 01/29/17  **Revised 12/31/16 with goal of 1.25 ft/sec   Baseline 2/16 1.17 ft/sec; 3/15 0.35ft/sec; 4/17  2.24 ft/sec   Time 4   Period Weeks   Status Achieved     PT LONG TERM GOAL #2   Title Pt will amb. 500' over even/uneven terrain with LRAD at MOD I level to improve functional mobility. 4/17 364 ft (continue goal-TARGET 03/18/17)   Status On-going     PT LONG TERM GOAL #3   Title Pt will amb. 150' with SPC at MOD I level (indoors) in order to safely amb. at home. 4/3 deferred per discussion with patient (currently does not feel is realistic goal and agrees to defer)   Status Deferred     PT LONG TERM GOAL #4   Title Pt will perform TUG with LRAD in </=13.5sec. to decr. falls risk.  **Revised 3/15--decrease TUG to <30 seconds to indicate lesser fall risk; 02/02/17 TUG <20 seconds (TARGET 03/18/17)   Baseline 4/17 30.91    Time 6  updated 4/17   Period Weeks   Status On-going     PT LONG TERM GOAL #5   Title Patient will demostrate reduced risk of falling reflected by incr Berg Balance Assessment to 39/56. (TARGET 03/18/17)   Baseline 4/24 35/56   Time 6  updated 4/24   Period Weeks   Status On-going     PT LONG TERM GOAL #6   Title Patient will ambulate with gait velocity >= 2.62 ft/sec for safe community ambulation. TARGET 03/18/17   Time 6   Period Weeks   Status New     PT LONG TERM GOAL #7   Title Patient will verbalize plan for continued exercise/activity in community upon discharge from PT.  TARGET 03/18/17   Time 6   Period Weeks   Status New               Plan - 03/09/17 2037    Clinical Impression Statement Session focused on postural stretches and strengthening to improve trunk strength and ability to maintain upright posture. Also focused on gait training with SPC with pt demonstrating improvement in upright posture and activity tolerance. Patient very pleased with her progress   Rehab Potential Good   Clinical Impairments Affecting Rehab Potential scoliosis, MS   PT Frequency 2x / week   PT Duration 6 weeks   PT Treatment/Interventions ADLs/Self Care Home Management;Biofeedback;Canalith Repostioning;Electrical Stimulation;Neuromuscular re-education;Balance training;Therapeutic exercise;Therapeutic activities;Manual techniques;Functional mobility training;Stair training;Gait training;DME Instruction;Patient/family education;Orthotic Fit/Training;Passive range of motion   PT Next Visit Plan spinal stretches for thoracic Rt convex and lumbar left convex scoliosis with cane in sitting; leg press; Nu-step;  SLS and balance; tandem; gait training with cane/quad tip   Consulted and Agree with Plan of Care Patient      Patient will benefit from  skilled therapeutic intervention in order to improve the following deficits and impairments:  Abnormal gait, Decreased endurance, Decreased knowledge of use of DME, Decreased balance, Decreased mobility, Decreased range of motion, Postural dysfunction, Impaired flexibility, Pain, Decreased strength  Visit Diagnosis: Other abnormalities of gait and mobility  Muscle weakness (generalized)  Abnormal posture     Problem List Patient Active Problem List   Diagnosis Date Noted  . Scoliosis 11/20/2016  . Long term current use of anticoagulant 09/18/2016  . Compression fracture of lumbar spine, non-traumatic, sequela 08/11/2016  . Multiple falls 08/11/2016  . At high risk for injury related to fall 08/11/2016  . Encounter for therapeutic drug monitoring 11/27/2013  . Hypothyroidism   . CHF (congestive heart failure) ([place])   . Paroxysmal atrial fibrillation (HCC)   . Arthritis   . Neurogenic bladder   . Warfarin anticoagulation   . First degree heart block 01/29/2012  . 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 S/P Maze operation for atrial fibrillation 01/26/2012  . Anxiety 01/18/2012  . Multiple sclerosis ([place]) 01/19/2007    Rexanne Mano, PT 03/09/2017, 8:42 PM  [facility] [address] [place], [place], [address] Phone: [phone]   Fax:  [phone]  Name: [name] [name] MRN: [id number] Date of Birth: 1951/09/04

## 2017-03-11 ENCOUNTER — Encounter: Payer: Self-pay | Admitting: Physical Therapy

## 2017-03-11 ENCOUNTER — Ambulatory Visit: Payer: Medicare Other | Admitting: Physical Therapy

## 2017-03-11 DIAGNOSIS — R293 Abnormal posture: Secondary | ICD-10-CM

## 2017-03-11 DIAGNOSIS — M6281 Muscle weakness (generalized): Secondary | ICD-10-CM | POA: Diagnosis not present

## 2017-03-11 DIAGNOSIS — R2689 Other abnormalities of gait and mobility: Secondary | ICD-10-CM

## 2017-03-11 NOTE — Therapy (Signed)
[facility] [address] [place], [place], [address] Phone: [phone]   Fax:  [phone]  Physical Therapy Treatment  Patient Details  Name: [name] [name] MRN: [id number] Date of Birth: [birth year] Referring Provider: Dr. Raoul Pitch  Encounter Date: 03/11/2017      PT End of Session - 03/11/17 1454    Visit Number 19  G6   Number of Visits 24  7/74 re-certification for 12 (+12 as of 4/17)= 24   Date for PT Re-Evaluation 03/18/17   Authorization Type G-CODE AND PROGRESS NOTE EVERY 10TH VISIT.    PT Start Time 1403   PT Stop Time 1444   PT Time Calculation (min) 41 min   Activity Tolerance Patient tolerated treatment well   Behavior During Therapy WFL for tasks assessed/performed      Past Medical History:  Diagnosis Date  . Anxiety   . Arthritis    knees  . Breast cancer ([place]) 1999  . CHF (congestive heart failure) ([place])    Related to severe mitral regurgitation, April, 2013  . 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 Assess by pulmonary team in the hospital April, 2013  . Ejection fraction    EF 60%, echo, April, 2013, with severe MR before mitral valve replacement  . Herpes   . Hypothyroidism   . IBS (irritable bowel syndrome)   . Mitral valve regurgitation    Mitral valve replacement April, 2013, Mitral valve prolapse  . Multiple sclerosis ([place])   . Neurogenic bladder   . Osteoporosis   . Ovarian cyst   . Paroxysmal atrial fibrillation (HCC)    Rapid atrial fibrillation in-hospital, Rapid cardioversion,  before mitral valve surgery  . Pulmonary hypertension ([place])    Echo, April, 2013, before mitral valve surgery  . S/P Maze operation for atrial fibrillation 01/26/2012   Complete biatrial lesion set using cryothermy via right mini thoracotomy  . 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 Warfarin anticoagulation    Mechanical mitral prosthesis, April, 20136    Past Surgical History:  Procedure Laterality Date  . BREAST LUMPECTOMY Right 1999   with sent.node, and axillary dissection (20)  . CHEST TUBE INSERTION  01/26/2012   Procedure: CHEST TUBE INSERTION;  Surgeon: CRexene Alberts MD;  [facility]  Service: Open Heart Surgery;  Laterality: Left;  . COLONOSCOPY  2010   "normal"  . CYSTOSCOPY  1992  . LAPAROSCOPIC OVARIAN CYSTECTOMY  1978   urethral stricture repair  . LEFT AND RIGHT HEART CATHETERIZATION WITH CORONARY ANGIOGRAM N/A 01/20/2012   Procedure: LEFT AND RIGHT HEART CATHETERIZATION WITH CORONARY ANGIOGRAM;  Surgeon: CBurnell Blanks MD;  [facility];  Service: Cardiovascular;  Laterality: N/A;  . LYMPHADENECTOMY    . MAZE  01/26/2012   Procedure: MAZE;  Surgeon: CRexene Alberts MD;  [facility]  Service: Open Heart Surgery;  Laterality: N/A;  . MITRAL VALVE REPLACEMENT  01/26/2012   Procedure: MINIMALLY INVASIVE MITRAL VALVE (MV) REPLACEMENT;  Surgeon: CRexene Alberts MD;  [facility]  Service: Open Heart Surgery;  Laterality: Right;  . TEE WITHOUT CARDIOVERSION  01/19/2012   Procedure: TRANSESOPHAGEAL ECHOCARDIOGRAM (TEE);  Surgeon: Peter M JMartinique MD;  [facility];  Service: Cardiovascular;  Laterality: N/A;  . TONSILLECTOMY  1970  . [place] . WRIST SURGERY Right 2012  There were no vitals filed for this visit.      Subjective Assessment - 03/11/17 1403    Subjective Trainer came yesterday and worked on her legs and walking. Able to do 2 laps around her apt with her cane. Rest and did two more laps. Wondering if she can start driving again.    Pertinent History MS (neurogenic bladder), first degree heart block, hx of R breast CA, a-fib, arthritis (B Knees), CHF, pt is on coumadin, hypothyroidism, scoliosis   Limitations Standing   How long can you stand comfortably? 1-2 minutes max    Patient Stated Goals Stand up straight and walk with very little assistance (with a cane vs. rollator)    Pain Onset More than a month ago                         [facility] Adult PT Treatment/Exercise - 03/11/17 0001      Transfers   Sit to Stand 6: Modified independent (Device/Increase time)  no armrests   Stand to Sit 6: Modified independent (Device/Increase time)  no armrests   Comments to cane vs rollator     Ambulation/Gait   Ambulation/Gait Yes   Ambulation/Gait Assistance 4: Min guard   Ambulation/Gait Assistance Details using SPC (regular tip) in rt hand; drifts to hre left, especially with turns to the right   Ambulation Distance (Feet) 240 Feet  240, 50 no device or UE support   Assistive device Straight cane   Gait Pattern Step-through pattern;Decreased step length - right;Decreased step length - left;Right foot flat;Left foot flat;Shuffle;Lateral hip instability;Lateral trunk lean to right;Trunk rotated posteriorly on right;Trunk flexed;Wide base of support;Decreased weight shift to left;Poor foot clearance - left;Poor foot clearance - right   Ambulation Surface Level;Indoor     Knee/Hip Exercises: Aerobic   Nustep L5 for 5 minutes with UEs             Balance Exercises - 03/11/17 1425      Balance Exercises: Standing   Tandem Stance Eyes open;Intermittent upper extremity support;5 reps  2-5 sec   SLS Eyes open;Intermittent upper extremity support   Tandem Gait Intermittent upper extremity support;4 reps   Partial Tandem Stance Eyes open;Intermittent upper extremity support;1 rep;15 secs   Retro Gait Upper extremity support;4 reps   Sidestepping Upper extremity support;4 reps   Marching Limitations length of bars x 2, no UE assist; fwd and backward             PT Short Term Goals - 03/04/17 1822      PT SHORT TERM GOAL #1   Title -----     PT SHORT TERM GOAL #2   Title -----     PT SHORT TERM GOAL #3   Title ---     PT SHORT TERM  GOAL #4   Title ---     PT SHORT TERM GOAL #5   Title ----     PT SHORT TERM GOAL #6   Title Set 02/02/17  Patient will perform TUG <= 25 seconds. TARGET date 02/27/16   Baseline 5/17  25.09 seconds   Time 3   Period Weeks   Status Achieved     PT SHORT TERM GOAL #7   Title Patient will improve gait velocity to 2.40 ft/sec as progressing to gait velocity required for safe community ambulation. TARGET date 02/27/16   Baseline 5/17 2.30 ft/sec  (did improve, just not to 2.40)   Time 3  Period Weeks   Status Partially Met     PT SHORT TERM GOAL #8   Title Patient will be independent with HEP exercises specifically for posture. TARGET date 02/27/16   Baseline 5/17   Time 3   Period Weeks   Status Achieved           PT Long Term Goals - 02/09/17 1659      PT LONG TERM GOAL #1   Title Pt will improve gait speed with LRAD to >/=2.66f/sec to safely amb. in the community. TARGET DATE FOR ALL LTGS: 01/29/17  **Revised 12/31/16 with goal of 1.25 ft/sec   Baseline 2/16 1.17 ft/sec; 3/15 0.749fsec; 4/17  2.24 ft/sec   Time 4   Period Weeks   Status Achieved     PT LONG TERM GOAL #2   Title Pt will amb. 500' over even/uneven terrain with LRAD at MOD I level to improve functional mobility. 4/17 364 ft (continue goal-TARGET 03/18/17)   Status On-going     PT LONG TERM GOAL #3   Title Pt will amb. 150' with SPC at MOD I level (indoors) in order to safely amb. at home. 4/3 deferred per discussion with patient (currently does not feel is realistic goal and agrees to defer)   Status Deferred     PT LONG TERM GOAL #4   Title Pt will perform TUG with LRAD in </=13.5sec. to decr. falls risk. **Revised 3/15--decrease TUG to <30 seconds to indicate lesser fall risk; 02/02/17 TUG <20 seconds (TARGET 03/18/17)   Baseline 4/17 30.91    Time 6  updated 4/17   Period Weeks   Status On-going     PT LONG TERM GOAL #5   Title Patient will demostrate reduced risk of falling reflected by incr Berg  Balance Assessment to 39/56. (TARGET 03/18/17)   Baseline 4/24 35/56   Time 6  updated 4/24   Period Weeks   Status On-going     PT LONG TERM GOAL #6   Title Patient will ambulate with gait velocity >= 2.62 ft/sec for safe community ambulation. TARGET 03/18/17   Time 6   Period Weeks   Status New     PT LONG TERM GOAL #7   Title Patient will verbalize plan for continued exercise/activity in community upon discharge from PT.  TARGET 03/18/17   Time 6   Period Weeks   Status New               Plan - 03/11/17 1455    Clinical Impression Statement session focused on balance training, LE strengthening, and gait training. Patient continues to make progress with use of SPC, but does not feel secure enough to try it at home. Plans to buy a rubber quad tip for her cane. Patient is pleased with her progress. Continue to work towards LTDu [place]   Clinical Impairments Affecting Rehab Potential scoliosis, MS   PT Frequency 2x / week   PT Duration 6 weeks   PT Treatment/Interventions ADLs/Self Care Home Management;Biofeedback;Canalith Repostioning;Electrical Stimulation;Neuromuscular re-education;Balance training;Therapeutic exercise;Therapeutic activities;Manual techniques;Functional mobility training;Stair training;Gait training;DME Instruction;Patient/family education;Orthotic Fit/Training;Passive range of motion   PT Next Visit Plan G code next visit; check LTGs 5/31; spinal stretches for thoracic Rt convex and lumbar left convex scoliosis with cane in sitting; leg press; Nu-step; SLS and balance; tandem; gait training with cane/quad tip   Consulted and Agree with Plan of Care Patient      Patient will benefit from skilled therapeutic  intervention in order to improve the following deficits and impairments:  Abnormal gait, Decreased endurance, Decreased knowledge of use of DME, Decreased balance, Decreased mobility, Decreased range of motion, Postural dysfunction,  Impaired flexibility, Pain, Decreased strength  Visit Diagnosis: Other abnormalities of gait and mobility  Muscle weakness (generalized)  Abnormal posture     Problem List Patient Active Problem List   Diagnosis Date Noted  . Scoliosis 11/20/2016  . Long term current use of anticoagulant 09/18/2016  . Compression fracture of lumbar spine, non-traumatic, sequela 08/11/2016  . Multiple falls 08/11/2016  . At high risk for injury related to fall 08/11/2016  . Encounter for therapeutic drug monitoring 11/27/2013  . Hypothyroidism   . CHF (congestive heart failure) ([place])   . Paroxysmal atrial fibrillation (HCC)   . Arthritis   . Neurogenic bladder   . Warfarin anticoagulation   . First degree heart block 01/29/2012  . 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 S/P Maze operation for atrial fibrillation 01/26/2012  . Anxiety 01/18/2012  . Multiple sclerosis ([place]) 01/19/2007    Rexanne Mano, PT 03/11/2017, 3:01 PM  [facility] [address] [place], [place], [address] Phone: [phone]   Fax:  [phone]  Name: [name] [name] MRN: [id number] Date of Birth: September 27, 1951

## 2017-03-16 ENCOUNTER — Encounter: Payer: Self-pay | Admitting: Physical Therapy

## 2017-03-16 ENCOUNTER — Ambulatory Visit: Payer: Medicare Other | Admitting: Physical Therapy

## 2017-03-16 DIAGNOSIS — R2689 Other abnormalities of gait and mobility: Secondary | ICD-10-CM | POA: Diagnosis not present

## 2017-03-16 DIAGNOSIS — M6281 Muscle weakness (generalized): Secondary | ICD-10-CM | POA: Diagnosis not present

## 2017-03-16 DIAGNOSIS — R293 Abnormal posture: Secondary | ICD-10-CM | POA: Diagnosis not present

## 2017-03-16 NOTE — Therapy (Signed)
[facility] [address] [place], [place], [address] Phone: [phone]   Fax:  [phone]  Physical Therapy Treatment  Patient Details  Name: [name] [name] MRN: [id number] Date of Birth: [birth year] Referring Provider: Dr. Raoul Pitch  Encounter Date: 03/16/2017      PT End of Session - 03/16/17 1657    Visit Number 20   Number of Visits 24  9/38 re-certification for 12 (+12 as of 4/17)= 24   Date for PT Re-Evaluation 03/18/17   Authorization Type G-CODE AND PROGRESS NOTE EVERY 10TH VISIT.    PT Start Time 1402   PT Stop Time 1444   PT Time Calculation (min) 42 min   Activity Tolerance Patient tolerated treatment well   Behavior During Therapy WFL for tasks assessed/performed      Past Medical History:  Diagnosis Date  . Anxiety   . Arthritis    knees  . Breast cancer ([place]) 1999  . CHF (congestive heart failure) ([place])    Related to severe mitral regurgitation, April, 2013  . 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 Assess by pulmonary team in the hospital April, 2013  . Ejection fraction    EF 60%, echo, April, 2013, with severe MR before mitral valve replacement  . Herpes   . Hypothyroidism   . IBS (irritable bowel syndrome)   . Mitral valve regurgitation    Mitral valve replacement April, 2013, Mitral valve prolapse  . Multiple sclerosis ([place])   . Neurogenic bladder   . Osteoporosis   . Ovarian cyst   . Paroxysmal atrial fibrillation (HCC)    Rapid atrial fibrillation in-hospital, Rapid cardioversion,  before mitral valve surgery  . Pulmonary hypertension ([place])    Echo, April, 2013, before mitral valve surgery  . S/P Maze operation for atrial fibrillation 01/26/2012   Complete biatrial lesion set using cryothermy via right mini thoracotomy  . S/P mitral valve replacement 01/26/2012   16m Sorin [employer] Optiform mechanical prosthesis via right mini thoracotomy  .  Warfarin anticoagulation    Mechanical mitral prosthesis, April, 20136    Past Surgical History:  Procedure Laterality Date  . BREAST LUMPECTOMY Right 1999   with sent.node, and axillary dissection (20)  . CHEST TUBE INSERTION  01/26/2012   Procedure: CHEST TUBE INSERTION;  Surgeon: CRexene Alberts MD;  [facility]  Service: Open Heart Surgery;  Laterality: Left;  . COLONOSCOPY  2010   "normal"  . CYSTOSCOPY  1992  . LAPAROSCOPIC OVARIAN CYSTECTOMY  1978   urethral stricture repair  . LEFT AND RIGHT HEART CATHETERIZATION WITH CORONARY ANGIOGRAM N/A 01/20/2012   Procedure: LEFT AND RIGHT HEART CATHETERIZATION WITH CORONARY ANGIOGRAM;  Surgeon: CBurnell Blanks MD;  [facility];  Service: Cardiovascular;  Laterality: N/A;  . LYMPHADENECTOMY    . MAZE  01/26/2012   Procedure: MAZE;  Surgeon: CRexene Alberts MD;  [facility]  Service: Open Heart Surgery;  Laterality: N/A;  . MITRAL VALVE REPLACEMENT  01/26/2012   Procedure: MINIMALLY INVASIVE MITRAL VALVE (MV) REPLACEMENT;  Surgeon: CRexene Alberts MD;  [facility]  Service: Open Heart Surgery;  Laterality: Right;  . TEE WITHOUT CARDIOVERSION  01/19/2012   Procedure: TRANSESOPHAGEAL ECHOCARDIOGRAM (TEE);  Surgeon: Peter M JMartinique MD;  [facility];  Service: Cardiovascular;  Laterality: N/A;  . TONSILLECTOMY  1970  . [place] . WRIST SURGERY Right 2012    There  were no vitals filed for this visit.      Subjective Assessment - 03/16/17 1649    Subjective Reports she is feeling stronger when walking in her home with her "grey" RW. (Only using rollator when she is out of the house).    Pertinent History MS (neurogenic bladder), first degree heart block, hx of R breast CA, a-fib, arthritis (B Knees), CHF, pt is on coumadin, hypothyroidism, scoliosis   Limitations Standing   How long can you stand comfortably? 1-2 minutes max   Patient Stated Goals Stand up straight and walk with  very little assistance (with a cane vs. rollator)    Currently in Pain? Other (Comment)  no reports of pain   Pain Onset More than a month ago                         [facility] Adult PT Treatment/Exercise - 03/16/17 1651      Transfers   Sit to Stand 6: Modified independent (Device/Increase time)  no armrests   Stand to Sit 6: Modified independent (Device/Increase time)  no armrests   Comments to cane vs rollator     Ambulation/Gait   Ambulation/Gait Yes   Ambulation/Gait Assistance 4: Min guard   Ambulation/Gait Assistance Details with rollator vs SPC with quad tip; pt more upright in general, however best posture with cane; she does fatigue more quickly with the cane   Ambulation Distance (Feet) 240 Feet  240,    Assistive device Straight cane;Rollator   Gait Pattern Step-through pattern;Decreased step length - right;Decreased step length - left;Right foot flat;Left foot flat;Shuffle;Lateral hip instability;Lateral trunk lean to right;Trunk rotated posteriorly on right;Trunk flexed;Wide base of support;Poor foot clearance - left;Poor foot clearance - right   Ambulation Surface Level;Indoor     Knee/Hip Exercises: Aerobic   Nustep L4 for 5 minutes with UEs             Balance Exercises - 03/16/17 1654      Balance Exercises: Standing   SLS Eyes open;Solid surface;Intermittent upper extremity support;3 reps;10 secs   Balance Beam blue, bil UE support; balance up to 15 seconds; step off/on fwd/backward with each leg   Other Standing Exercises bosu in // bars (standing on compliant surface (flat side down); baalance x 30 sec light UE support; step off/on backwards each leg, then sideways each leg             PT Short Term Goals - 03/04/17 1822      PT SHORT TERM GOAL #1   Title -----     PT SHORT TERM GOAL #2   Title -----     PT SHORT TERM GOAL #3   Title ---     PT SHORT TERM GOAL #4   Title ---     PT SHORT TERM GOAL #5   Title ----      PT SHORT TERM GOAL #6   Title Set 02/02/17  Patient will perform TUG <= 25 seconds. TARGET date 02/27/16   Baseline 5/17  25.09 seconds   Time 3   Period Weeks   Status Achieved     PT SHORT TERM GOAL #7   Title Patient will improve gait velocity to 2.40 ft/sec as progressing to gait velocity required for safe community ambulation. TARGET date 02/27/16   Baseline 5/17 2.30 ft/sec  (did improve, just not to 2.40)   Time 3   Period Weeks   Status Partially Met  PT SHORT TERM GOAL #8   Title Patient will be independent with HEP exercises specifically for posture. TARGET date 02/27/16   Baseline 5/17   Time 3   Period Weeks   Status Achieved           PT Long Term Goals - 03/16/17 1707      PT LONG TERM GOAL #1   Title Pt will improve gait speed with LRAD to >/=2.30ft/sec to safely amb. in the community. TARGET DATE FOR ALL LTGS: 01/29/17  **Revised 12/31/16 with goal of 1.25 ft/sec   Baseline 2/16 1.17 ft/sec; 3/15 0.69ft/sec; 4/17  2.24 ft/sec   Time 4   Period Weeks   Status Achieved     PT LONG TERM GOAL #2   Title Pt will amb. 500' over even/uneven terrain with LRAD at MOD I level to improve functional mobility. 4/17 364 ft (continue goal-TARGET 03/26/17, extended due to missed week)   Status On-going     PT LONG TERM GOAL #3   Title Pt will amb. 150' with SPC at MOD I level (indoors) in order to safely amb. at home.    Baseline 4/3 deferred per discussion with patient (currently does not feel is realistic goal and agrees to defer);    Status Deferred     PT LONG TERM GOAL #4   Title Pt will perform TUG with LRAD in </=13.5sec. to decr. falls risk. **Revised 3/15--decrease TUG to <30 seconds to indicate lesser fall risk; 02/02/17 TUG <20 seconds (TARGET 03/26/17, extended due to missed week)    Baseline 4/17 30.91    Time 6  updated 4/17   Period Weeks   Status On-going     PT LONG TERM GOAL #5   Title Patient will demostrate reduced risk of falling reflected by incr  Berg Balance Assessment to 39/56. (TARGET 03/26/17, extended due to missed week   Baseline 4/24 35/56   Time 6  updated 4/24   Period Weeks   Status On-going     PT LONG TERM GOAL #6   Title Patient will ambulate with gait velocity >= 2.62 ft/sec for safe community ambulation. TARGET 03/26/17, extended due to missed week   Time 6   Period Weeks   Status New     PT LONG TERM GOAL #7   Title Patient will verbalize plan for continued exercise/activity in community upon discharge from PT.  TARGET 03/26/17, extended due to missed week   Time 6   Period Weeks   Status New               Plan - 03/16/17 1659    Clinical Impression Statement Patient continues to progress towards LTGs. Her posture and balance continue to improve and she has even progressed to using a SPC during therapy (a goal she previously deferred as she thought it was unattainable). Patient has ordered a quad rubber tip for her [facility] and expects it to arrive soon. Session focused on gait training, balance training, and LE strengthening.    Rehab Potential Good   Clinical Impairments Affecting Rehab Potential scoliosis, MS   PT Frequency 2x / week   PT Duration 6 weeks   PT Treatment/Interventions ADLs/Self Care Home Management;Biofeedback;Canalith Repostioning;Electrical Stimulation;Neuromuscular re-education;Balance training;Therapeutic exercise;Therapeutic activities;Manual techniques;Functional mobility training;Stair training;Gait training;DME Instruction;Patient/family education;Orthotic Fit/Training;Passive range of motion   PT Next Visit Plan check LTGs 6/8 (due to missed one week); leg press; Nu-step; SLS and balance; tandem; gait training with cane/quad tip; spinal stretches for thoracic  Rt convex and lumbar left convex scoliosis with cane in sitting;    Consulted and Agree with Plan of Care Patient      Patient will benefit from skilled therapeutic intervention in order to improve the following deficits and  impairments:  Abnormal gait, Decreased endurance, Decreased knowledge of use of DME, Decreased balance, Decreased mobility, Decreased range of motion, Postural dysfunction, Impaired flexibility, Pain, Decreased strength  Visit Diagnosis: Other abnormalities of gait and mobility  Muscle weakness (generalized)  Abnormal posture       G-Codes - 2017-04-15 1705    Functional Assessment Tool Used (Outpatient Only) gait velocity 2.30 ft/sec; TUG 25.09 sec (both with rollator)   Functional Limitation Mobility: Walking and moving around   Mobility: Walking and Moving Around Current Status [phone]) At least 20 percent but less than 40 percent impaired, limited or restricted   Mobility: Walking and Moving Around Goal Status [phone]) At least 20 percent but less than 40 percent impaired, limited or restricted      Problem List Patient Active Problem List   Diagnosis Date Noted  . Scoliosis 11/20/2016  . Long term current use of anticoagulant 09/18/2016  . Compression fracture of lumbar spine, non-traumatic, sequela 08/11/2016  . Multiple falls 08/11/2016  . At high risk for injury related to fall 08/11/2016  . Encounter for therapeutic drug monitoring 11/27/2013  . Hypothyroidism   . CHF (congestive heart failure) ([place])   . Paroxysmal atrial fibrillation (HCC)   . Arthritis   . Neurogenic bladder   . Warfarin anticoagulation   . First degree heart block 01/29/2012  . 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 S/P Maze operation for atrial fibrillation 01/26/2012  . Anxiety 01/18/2012  . Multiple sclerosis ([place]) 01/19/2007   Physical Therapy Progress Note  Dates of Reporting Period: 01/21/17 to 2017-04-15  Objective Measurements: gait velocity 2.30 ft/sec; TUG 25.09 sec  Goal Update: see above  Plan: see above  Reason Skilled Services are Required: continue to progress towards LTGs due 03/26/17    Rexanne Mano, PT 15-Apr-2017, 5:16 PM  [facility] [address] [place], [place], [address] Phone: [phone]   Fax:  [phone]  Name: [name] [name] MRN: [id number] Date of Birth: 1950/12/30

## 2017-03-18 ENCOUNTER — Ambulatory Visit: Payer: Medicare Other | Admitting: Physical Therapy

## 2017-03-23 ENCOUNTER — Ambulatory Visit: Payer: Medicare Other | Attending: Family Medicine | Admitting: Physical Therapy

## 2017-03-23 ENCOUNTER — Encounter: Payer: Self-pay | Admitting: Physical Therapy

## 2017-03-23 DIAGNOSIS — R2689 Other abnormalities of gait and mobility: Secondary | ICD-10-CM | POA: Insufficient documentation

## 2017-03-23 DIAGNOSIS — M6281 Muscle weakness (generalized): Secondary | ICD-10-CM | POA: Diagnosis not present

## 2017-03-23 DIAGNOSIS — R293 Abnormal posture: Secondary | ICD-10-CM

## 2017-03-23 NOTE — Therapy (Signed)
[facility] [address] [place], [place], [address] Phone: [phone]   Fax:  [phone]  Physical Therapy Treatment  Patient Details  Name: CLARISSA LAIRD MRN: [id number] Date of Birth: Feb 07, 1951 Referring Provider: Dr. Raoul Pitch  Encounter Date: 03/23/2017      PT End of Session - 03/23/17 2119    Visit Number 21   Number of Visits 24  5/36 re-certification for 12 (+12 as of 4/17)= 24   Date for PT Re-Evaluation 03/26/17   Authorization Type G-CODE AND PROGRESS NOTE EVERY 10TH VISIT.    PT Start Time 1404   PT Stop Time 1444   PT Time Calculation (min) 40 min   Activity Tolerance Patient tolerated treatment well   Behavior During Therapy WFL for tasks assessed/performed      Past Medical History:  Diagnosis Date  . Anxiety   . Arthritis    knees  . Breast cancer ([place]) 1999  . CHF (congestive heart failure) ([place])    Related to severe mitral regurgitation, April, 2013  . 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 Assess by pulmonary team in the hospital April, 2013  . Ejection fraction    EF 60%, echo, April, 2013, with severe MR before mitral valve replacement  . Herpes   . Hypothyroidism   . IBS (irritable bowel syndrome)   . Mitral valve regurgitation    Mitral valve replacement April, 2013, Mitral valve prolapse  . Multiple sclerosis ([place])   . Neurogenic bladder   . Osteoporosis   . Ovarian cyst   . Paroxysmal atrial fibrillation (HCC)    Rapid atrial fibrillation in-hospital, Rapid cardioversion,  before mitral valve surgery  . Pulmonary hypertension ([place])    Echo, April, 2013, before mitral valve surgery  . S/P Maze operation for atrial fibrillation 01/26/2012   Complete biatrial lesion set using cryothermy via right mini thoracotomy  . S/P mitral valve replacement 01/26/2012   17m Sorin [employer] Optiform mechanical prosthesis via right mini thoracotomy  .  Warfarin anticoagulation    Mechanical mitral prosthesis, April, 20136    Past Surgical History:  Procedure Laterality Date  . BREAST LUMPECTOMY Right 1999   with sent.node, and axillary dissection (20)  . CHEST TUBE INSERTION  01/26/2012   Procedure: CHEST TUBE INSERTION;  Surgeon: CRexene Alberts MD;  [facility]  Service: Open Heart Surgery;  Laterality: Left;  . COLONOSCOPY  2010   "normal"  . CYSTOSCOPY  1992  . LAPAROSCOPIC OVARIAN CYSTECTOMY  1978   urethral stricture repair  . LEFT AND RIGHT HEART CATHETERIZATION WITH CORONARY ANGIOGRAM N/A 01/20/2012   Procedure: LEFT AND RIGHT HEART CATHETERIZATION WITH CORONARY ANGIOGRAM;  Surgeon: CBurnell Blanks MD;  [facility];  Service: Cardiovascular;  Laterality: N/A;  . LYMPHADENECTOMY    . MAZE  01/26/2012   Procedure: MAZE;  Surgeon: CRexene Alberts MD;  [facility]  Service: Open Heart Surgery;  Laterality: N/A;  . MITRAL VALVE REPLACEMENT  01/26/2012   Procedure: MINIMALLY INVASIVE MITRAL VALVE (MV) REPLACEMENT;  Surgeon: CRexene Alberts MD;  [facility]  Service: Open Heart Surgery;  Laterality: Right;  . TEE WITHOUT CARDIOVERSION  01/19/2012   Procedure: TRANSESOPHAGEAL ECHOCARDIOGRAM (TEE);  Surgeon: Peter M JMartinique MD;  [facility];  Service: Cardiovascular;  Laterality: N/A;  . TONSILLECTOMY  1970  . [place] . WRIST SURGERY Right 2012    There  were no vitals filed for this visit.      Subjective Assessment - 03/23/17 1409    Subjective Reports doing well.    Pertinent History MS (neurogenic bladder), first degree heart block, hx of R breast CA, a-fib, arthritis (B Knees), CHF, pt is on coumadin, hypothyroidism, scoliosis   Limitations Standing   How long can you stand comfortably? 1-2 minutes max   Patient Stated Goals Stand up straight and walk with very little assistance (with a cane vs. rollator)    Currently in Pain? No/denies   Pain Onset More than a  month ago                         [facility] Adult PT Treatment/Exercise - 03/23/17 1420      Ambulation/Gait   Ambulation/Gait Assistance 4: Min guard   Ambulation/Gait Assistance Details with rollator requires vc for foot clearance, proximity to rollator; gait is improved with use of SPC; pt continues to be reluctant to use cane outside of PT sessions; able to negotiate sloping sidewalk with unlevel seams with supervision   Ambulation Distance (Feet) 360 Feet   Assistive device Straight cane;Rollator   Gait Pattern Step-through pattern;Decreased step length - right;Decreased step length - left;Right foot flat;Left foot flat;Shuffle;Lateral hip instability;Lateral trunk lean to right;Trunk rotated posteriorly on right;Trunk flexed;Wide base of support;Poor foot clearance - left;Poor foot clearance - right   Ambulation Surface Level;Indoor;Outdoor;Paved   Curb 5: Supervision  with cane     Posture/Postural Control   Posture Comments postural stretches and strengthening of over-lengthened muscles on opposite side of curves             Balance Exercises - 03/23/17 2116      Balance Exercises: Standing   Tandem Stance Eyes open;Intermittent upper extremity support;2 reps;10 secs   SLS Eyes open;Upper extremity support 1;3 reps;10 secs   SLS with Vectors Solid surface;Intermittent upper extremity assist  toe tap foam bubbles, alternating   Tandem Gait Forward;Upper extremity support;1 rep   Retro Gait Upper extremity support;3 reps   Sidestepping Upper extremity support;3 reps           PT Education - 03/23/17 2119    Education provided Yes   Education Details progressing to using [facility] in her apartment and then when going out with assistance, outside in [employer]) Educated Patient   Methods Explanation   Comprehension Verbalized understanding          PT Short Term Goals - 03/04/17 1822      PT SHORT TERM GOAL #1   Title -----     PT SHORT  TERM GOAL #2   Title -----     PT SHORT TERM GOAL #3   Title ---     PT SHORT TERM GOAL #4   Title ---     PT SHORT TERM GOAL #5   Title ----     PT SHORT TERM GOAL #6   Title Set 02/02/17  Patient will perform TUG <= 25 seconds. TARGET date 02/27/16   Baseline 5/17  25.09 seconds   Time 3   Period Weeks   Status Achieved     PT SHORT TERM GOAL #7   Title Patient will improve gait velocity to 2.40 ft/sec as progressing to gait velocity required for safe community ambulation. TARGET date 02/27/16   Baseline 5/17 2.30 ft/sec  (did improve, just not to 2.40)   Time 3   Period  Weeks   Status Partially Met     PT SHORT TERM GOAL #8   Title Patient will be independent with HEP exercises specifically for posture. TARGET date 02/27/16   Baseline 5/17   Time 3   Period Weeks   Status Achieved           PT Long Term Goals - 03/16/17 1707      PT LONG TERM GOAL #1   Title Pt will improve gait speed with LRAD to >/=2.34f/sec to safely amb. in the community. TARGET DATE FOR ALL LTGS: 01/29/17  **Revised 12/31/16 with goal of 1.25 ft/sec   Baseline 2/16 1.17 ft/sec; 3/15 0.755fsec; 4/17  2.24 ft/sec   Time 4   Period Weeks   Status Achieved     PT LONG TERM GOAL #2   Title Pt will amb. 500' over even/uneven terrain with LRAD at MOD I level to improve functional mobility. 4/17 364 ft (continue goal-TARGET 03/26/17, extended due to missed week)   Status On-going     PT LONG TERM GOAL #3   Title Pt will amb. 150' with SPC at MOD I level (indoors) in order to safely amb. at home.    Baseline 4/3 deferred per discussion with patient (currently does not feel is realistic goal and agrees to defer);    Status Deferred     PT LONG TERM GOAL #4   Title Pt will perform TUG with LRAD in </=13.5sec. to decr. falls risk. **Revised 3/15--decrease TUG to <30 seconds to indicate lesser fall risk; 02/02/17 TUG <20 seconds (TARGET 03/26/17, extended due to missed week)    Baseline 4/17 30.91    Time  6  updated 4/17   Period Weeks   Status On-going     PT LONG TERM GOAL #5   Title Patient will demostrate reduced risk of falling reflected by incr Berg Balance Assessment to 39/56. (TARGET 03/26/17, extended due to missed week   Baseline 4/24 35/56   Time 6  updated 4/24   Period Weeks   Status On-going     PT LONG TERM GOAL #6   Title Patient will ambulate with gait velocity >= 2.62 ft/sec for safe community ambulation. TARGET 03/26/17, extended due to missed week   Time 6   Period Weeks   Status New     PT LONG TERM GOAL #7   Title Patient will verbalize plan for continued exercise/activity in community upon discharge from PT.  TARGET 03/26/17, extended due to missed week   Time 6 [place] 03/23/17 2120    Clinical Impression Statement Skilled session focused on gait training on level indoor and slightly unlevel outdoor surfaces with SPC. Remainder of session focused on balance training. Patient progressing well with plan to review HEP and d/c next visit.   Rehab Potential Good   Clinical Impairments Affecting Rehab Potential scoliosis, MS   PT Frequency 2x / week   PT Duration 6 weeks   PT Treatment/Interventions ADLs/Self Care Home Management;Biofeedback;Canalith Repostioning;Electrical Stimulation;Neuromuscular re-education;Balance training;Therapeutic exercise;Therapeutic activities;Manual techniques;Functional mobility training;Stair training;Gait training;DME Instruction;Patient/family education;Orthotic Fit/Training;Passive range of motion   PT Next Visit Plan check LTGs and d/c   Consulted and Agree with Plan of Care Patient      Patient will benefit from skilled therapeutic intervention in order to improve the following deficits and impairments:  Abnormal gait, Decreased endurance, Decreased  knowledge of use of DME, Decreased balance, Decreased mobility, Decreased range of motion, Postural dysfunction, Impaired flexibility,  Pain, Decreased strength  Visit Diagnosis: Other abnormalities of gait and mobility  Muscle weakness (generalized)  Abnormal posture     Problem List Patient Active Problem List   Diagnosis Date Noted  . Scoliosis 11/20/2016  . Long term current use of anticoagulant 09/18/2016  . Compression fracture of lumbar spine, non-traumatic, sequela 08/11/2016  . Multiple falls 08/11/2016  . At high risk for injury related to fall 08/11/2016  . Encounter for therapeutic drug monitoring 11/27/2013  . Hypothyroidism   . CHF (congestive heart failure) ([place])   . Paroxysmal atrial fibrillation (HCC)   . Arthritis   . Neurogenic bladder   . Warfarin anticoagulation   . First degree heart block 01/29/2012  . 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 S/P Maze operation for atrial fibrillation 01/26/2012  . Anxiety 01/18/2012  . Multiple sclerosis ([place]) 01/19/2007    Rexanne Mano, PT 03/23/2017, 9:26 PM  [facility] [address] [place], [place], [address] Phone: [phone]   Fax:  [phone]  Name: ALEJANDRINA RAIMER MRN: [id number] Date of Birth: 01-24-1951

## 2017-03-25 ENCOUNTER — Encounter: Payer: Self-pay | Admitting: Physical Therapy

## 2017-03-25 ENCOUNTER — Ambulatory Visit: Payer: Medicare Other | Admitting: Physical Therapy

## 2017-03-25 DIAGNOSIS — R293 Abnormal posture: Secondary | ICD-10-CM | POA: Diagnosis not present

## 2017-03-25 DIAGNOSIS — R2689 Other abnormalities of gait and mobility: Secondary | ICD-10-CM

## 2017-03-25 DIAGNOSIS — M6281 Muscle weakness (generalized): Secondary | ICD-10-CM | POA: Diagnosis not present

## 2017-03-25 NOTE — Therapy (Signed)
[facility] [address] [place], [place], [address] Phone: [phone]   Fax:  [phone]  Physical Therapy Treatment and Discharge Summary  Patient Details  Name: [name] [name] MRN: [id number] Date of Birth: 08/31/1951 Referring Provider: Dr. Raoul Pitch  Encounter Date: 03/25/2017      PT End of Session - 03/25/17 1819    Visit Number 22   Number of Visits 24  3/81 re-certification for 12 (+12 as of 4/17)= 24   Date for PT Re-Evaluation 03/18/17   Authorization Type G-CODE AND PROGRESS NOTE EVERY 10TH VISIT.    PT Start Time 1406   PT Stop Time 1447   PT Time Calculation (min) 41 min   Activity Tolerance Patient tolerated treatment well   Behavior During Therapy WFL for tasks assessed/performed      Past Medical History:  Diagnosis Date  . Anxiety   . Arthritis    knees  . Breast cancer ([place]) 1999  . CHF (congestive heart failure) ([place])    Related to severe mitral regurgitation, April, 2013  . 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 Assess by pulmonary team in the hospital April, 2013  . Ejection fraction    EF 60%, echo, April, 2013, with severe MR before mitral valve replacement  . Herpes   . Hypothyroidism   . IBS (irritable bowel syndrome)   . Mitral valve regurgitation    Mitral valve replacement April, 2013, Mitral valve prolapse  . Multiple sclerosis ([place])   . Neurogenic bladder   . Osteoporosis   . Ovarian cyst   . Paroxysmal atrial fibrillation (HCC)    Rapid atrial fibrillation in-hospital, Rapid cardioversion,  before mitral valve surgery  . Pulmonary hypertension ([place])    Echo, April, 2013, before mitral valve surgery  . S/P Maze operation for atrial fibrillation 01/26/2012   Complete biatrial lesion set using cryothermy via right mini thoracotomy  . S/P mitral valve replacement 01/26/2012   55m Sorin [employer] Optiform mechanical prosthesis via right  mini thoracotomy  . Warfarin anticoagulation    Mechanical mitral prosthesis, April, 20136    Past Surgical History:  Procedure Laterality Date  . BREAST LUMPECTOMY Right 1999   with sent.node, and axillary dissection (20)  . CHEST TUBE INSERTION  01/26/2012   Procedure: CHEST TUBE INSERTION;  Surgeon: CRexene Alberts MD;  [facility]  Service: Open Heart Surgery;  Laterality: Left;  . COLONOSCOPY  2010   "normal"  . CYSTOSCOPY  1992  . LAPAROSCOPIC OVARIAN CYSTECTOMY  1978   urethral stricture repair  . LEFT AND RIGHT HEART CATHETERIZATION WITH CORONARY ANGIOGRAM N/A 01/20/2012   Procedure: LEFT AND RIGHT HEART CATHETERIZATION WITH CORONARY ANGIOGRAM;  Surgeon: CBurnell Blanks MD;  [facility];  Service: Cardiovascular;  Laterality: N/A;  . LYMPHADENECTOMY    . MAZE  01/26/2012   Procedure: MAZE;  Surgeon: CRexene Alberts MD;  [facility]  Service: Open Heart Surgery;  Laterality: N/A;  . MITRAL VALVE REPLACEMENT  01/26/2012   Procedure: MINIMALLY INVASIVE MITRAL VALVE (MV) REPLACEMENT;  Surgeon: CRexene Alberts MD;  [facility]  Service: Open Heart Surgery;  Laterality: Right;  . TEE WITHOUT CARDIOVERSION  01/19/2012   Procedure: TRANSESOPHAGEAL ECHOCARDIOGRAM (TEE);  Surgeon: Peter M JMartinique MD;  [facility];  Service: Cardiovascular;  Laterality: N/A;  . TONSILLECTOMY  1970  . [place] . WRIST SURGERY Right 2012  There were no vitals filed for this visit.      Subjective Assessment - 03/25/17 1407    Subjective Allergies are acting up. Dropped a glass jar on her right foot and her 3rd toe is sore. "It will probably effect my times today" (timing for LTG assessment)   Pertinent History MS (neurogenic bladder), first degree heart block, hx of R breast CA, a-fib, arthritis (B Knees), CHF, pt is on coumadin, hypothyroidism, scoliosis   Limitations Standing   How long can you stand comfortably? 1-2 minutes max    Patient Stated Goals Stand up straight and walk with very little assistance (with a cane vs. rollator)    Currently in Pain? Yes   Pain Score 3    Pain Location Foot   Pain Orientation Right   Pain Descriptors / Indicators Guarding;Sore   Pain Type Acute pain   Pain Onset Yesterday   Pain Frequency Intermittent                         [facility] Adult PT Treatment/Exercise - 03/25/17 0001      Transfers   Sit to Stand 6: Modified independent (Device/Increase time);With armrests   Stand to Sit 6: Modified independent (Device/Increase time);With armrests   Comments attempted without use of UEs and pt unable to even initiate lifting hips off chair     Ambulation/Gait   Ambulation/Gait Assistance 6: Modified independent (Device/Increase time);5: Supervision   Ambulation/Gait Assistance Details modified independent with rollator; supervision for safety with SPC; more upright with cane, however walks more slowly/cautious   Ambulation Distance (Feet) 120 Feet  incr distance deferred due to sore toe   Assistive device Straight cane;Rollator   Gait Pattern Step-through pattern;Decreased step length - right;Decreased step length - left;Right foot flat;Left foot flat;Shuffle;Lateral hip instability;Lateral trunk lean to right;Trunk rotated posteriorly on right;Trunk flexed;Wide base of support;Poor foot clearance - left;Poor foot clearance - right   Ambulation Surface Level;Indoor   Gait velocity 20 ft/11.38 sec=1.76 ft/sec  with SPC; rollator 2.27 ft/sec     Posture/Postural Control   Posture Comments seated postural stretches while resting between testing     Berg Balance Test   Sit to Stand Able to stand  independently using hands   Standing Unsupported Able to stand safely 2 minutes   Sitting with Back Unsupported but Feet Supported on Floor or Stool Able to sit safely and securely 2 minutes   Stand to Sit Controls descent by using hands   Transfers Able to transfer safely,  minor use of hands   Standing Unsupported with Eyes Closed Able to stand 10 seconds safely   Standing Ubsupported with Feet Together Able to place feet together independently but unable to hold for 30 seconds   From Standing, Reach Forward with Outstretched Arm Can reach forward >12 cm safely (5")   From Standing Position, Pick up Object from Floor Able to pick up shoe safely and easily   From Standing Position, Turn to Look Behind Over each Shoulder Turn sideways only but maintains balance   Turn 360 Degrees Needs close supervision or verbal cueing   Standing Unsupported, Alternately Place Feet on Step/Stool Needs assistance to keep from falling or unable to try   Standing Unsupported, One Foot in Front Needs help to step but can hold 15 seconds   Standing on One Leg Tries to lift leg/unable to hold 3 seconds but remains standing independently   Total Score 36  Timed Up and Go Test   Normal TUG (seconds) 23.68  with rollator; 33.06 with SPC                  PT Short Term Goals - 03/04/17 1822      PT SHORT TERM GOAL #1   Title -----     PT SHORT TERM GOAL #2   Title -----     PT SHORT TERM GOAL #3   Title ---     PT SHORT TERM GOAL #4   Title ---     PT SHORT TERM GOAL #5   Title ----     PT SHORT TERM GOAL #6   Title Set 02/02/17  Patient will perform TUG <= 25 seconds. TARGET date 02/27/16   Baseline 5/17  25.09 seconds   Time 3   Period Weeks   Status Achieved     PT SHORT TERM GOAL #7   Title Patient will improve gait velocity to 2.40 ft/sec as progressing to gait velocity required for safe community ambulation. TARGET date 02/27/16   Baseline 5/17 2.30 ft/sec  (did improve, just not to 2.40)   Time 3   Period Weeks   Status Partially Met     PT SHORT TERM GOAL #8   Title Patient will be independent with HEP exercises specifically for posture. TARGET date 02/27/16   Baseline 5/17   Time 3   Period Weeks   Status Achieved           PT Long  Term Goals - 03/25/17 1830      PT LONG TERM GOAL #1   Title Pt will improve gait speed with LRAD to >/=2.53f/sec to safely amb. in the community. TARGET DATE FOR ALL LTGS: 01/29/17  **Revised 12/31/16 with goal of 1.25 ft/sec   Baseline 2/16 1.17 ft/sec; 3/15 0.734fsec; 4/17  2.24 ft/sec   Time 4   Period Weeks   Status Achieved     PT LONG TERM GOAL #2   Title Pt will amb. 500' over even/uneven terrain with LRAD at MOD I level to improve functional mobility. 4/17 364 ft (continue goal-TARGET 03/26/17, extended due to missed week)   Baseline 5/8 720 ft with rollator modified independent   Status Achieved     PT LONG TERM GOAL #3   Title Pt will amb. 150' with SPC at MOD I level (indoors) in order to safely amb. at home.    Baseline 4/3 deferred per discussion with patient (currently does not feel is realistic goal and agrees to defer); mid-May reinstated as a goal; 6/8 supervision with SPC x 180   Status Partially Met     PT LONG TERM GOAL #4   Title Pt will perform TUG with LRAD in </=13.5sec. to decr. falls risk. **Revised 3/15--decrease TUG to <30 seconds to indicate lesser fall risk; 02/02/17 TUG <20 seconds (TARGET 03/26/17, extended due to missed week)    Baseline 4/17 30.91  6/8 23.68    Time 6  updated 4/17   Period Weeks   Status Partially Met     PT LONG TERM GOAL #5   Title Patient will demostrate reduced risk of falling reflected by incr Berg Balance Assessment to 39/56. (TARGET 03/26/17, extended due to missed week   Baseline 4/24 35/56  6/8 36/56   Time 6  updated 4/24   Period Weeks   Status Partially Met     PT LONG TERM GOAL #6   Title Patient will  ambulate with gait velocity >= 2.62 ft/sec for safe community ambulation. TARGET 03/26/17, extended due to missed week   Baseline 6/8 2.30 ft/sec   Time 6   Period Weeks   Status Partially Met     PT LONG TERM GOAL #7   Title Patient will verbalize plan for continued exercise/activity in community upon discharge from PT.   TARGET 03/26/17, extended due to missed week   Baseline 6/8 Plans to continue HEP, personal trainer 1x/wk, and ask neighbors to go for walks with her.    Time 6   Period Weeks   Status Achieved               Plan - 2017/04/20 1840    Clinical Impression Statement Final session with LTGs assessed. Patient met 3 of 7 goals and partially met 4 of 7 (improved but not to goal level). Of note, pt injured her rt foot the previous night and feels this impacted her abilities today. Overall, pt has been very motivated to improve and compliant with her HEP. Anticipate she will continue to improve as she is committed to exercising at this time.   Rehab Potential Good   Clinical Impairments Affecting Rehab Potential scoliosis, MS   Consulted and Agree with Plan of Care Patient      Patient will benefit from skilled therapeutic intervention in order to improve the following deficits and impairments:     Visit Diagnosis: Other abnormalities of gait and mobility  Muscle weakness (generalized)  Abnormal posture       G-Codes - 2017/04/20 1844    Functional Assessment Tool Used (Outpatient Only) Gait velocity 2.30 ft/sec; TUG 23.68, Berg 36/56   Functional Limitation Mobility: Walking and moving around   Mobility: Walking and Moving Around Goal Status [phone]) At least 20 percent but less than 40 percent impaired, limited or restricted   Mobility: Walking and Moving Around Discharge Status [phone]) At least 1 percent but less than 20 percent impaired, limited or restricted      Problem List Patient Active Problem List   Diagnosis Date Noted  . Scoliosis 11/20/2016  . Long term current use of anticoagulant 09/18/2016  . Compression fracture of lumbar spine, non-traumatic, sequela 08/11/2016  . Multiple falls 08/11/2016  . At high risk for injury related to fall 08/11/2016  . Encounter for therapeutic drug monitoring 11/27/2013  . Hypothyroidism   . CHF (congestive heart failure) ([place])   .  Paroxysmal atrial fibrillation (HCC)   . Arthritis   . Neurogenic bladder   . Warfarin anticoagulation   . First degree heart block 01/29/2012  . 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 S/P Maze operation for atrial fibrillation 01/26/2012  . Anxiety 01/18/2012  . Multiple sclerosis ([place]) 01/19/2007   PHYSICAL THERAPY DISCHARGE SUMMARY  Visits from Start of Care: 22  Current functional level related to goals / functional outcomes: All goals met or partially met (improved but not to target/goal level); modified independent with rollator and supervision with cane   Remaining deficits: Balance and gait primarily impaired due to scoliosis (due to compresssion fractures)   Education / Equipment: HEP, proper use of rollator and single point cane  Plan: Patient agrees to discharge.  Patient goals were partially met. Patient is being discharged due to being pleased with the current functional level.  ?????        Rexanne Mano , PT 04-20-2017, 6:45 PM  [facility] [address] [place],  [place], [address] Phone: [phone]   Fax:  [phone]  Name: JIREH VINAS MRN: [id number] Date of Birth: 1951/10/09

## 2017-04-06 DIAGNOSIS — G35 Multiple sclerosis: Secondary | ICD-10-CM | POA: Diagnosis not present

## 2017-04-06 DIAGNOSIS — E039 Hypothyroidism, unspecified: Secondary | ICD-10-CM | POA: Diagnosis not present

## 2017-04-06 DIAGNOSIS — E559 Vitamin D deficiency, unspecified: Secondary | ICD-10-CM | POA: Diagnosis not present

## 2017-04-06 DIAGNOSIS — R7989 Other specified abnormal findings of blood chemistry: Secondary | ICD-10-CM | POA: Diagnosis not present

## 2017-04-06 LAB — CBC AND DIFFERENTIAL
HCT: 42 (ref 36–46)
HEMOGLOBIN: 13.9 (ref 12.0–16.0)
Platelets: 298 (ref 150–399)
WBC: 5.5

## 2017-04-06 LAB — VITAMIN D 25 HYDROXY (VIT D DEFICIENCY, FRACTURES): Vit D, 25-Hydroxy: 69

## 2017-04-06 LAB — BASIC METABOLIC PANEL
BUN: 27 — AB (ref 4–21)
CREATININE: 0.7 (ref 0.5–1.1)
Glucose: 97
POTASSIUM: 4.1 (ref 3.4–5.3)
Sodium: 139 (ref 137–147)

## 2017-04-06 LAB — HEMOGLOBIN A1C: HEMOGLOBIN A1C: 5.2

## 2017-04-06 LAB — HEPATIC FUNCTION PANEL
ALT: 10 (ref 7–35)
AST: 15 (ref 13–35)
Alkaline Phosphatase: 96 (ref 25–125)
BILIRUBIN, TOTAL: 0.3

## 2017-04-06 LAB — LIPID PANEL
Cholesterol: 244 — AB (ref 0–200)
HDL: 64 (ref 35–70)
LDL CALC: 158
LDL/HDL RATIO: 3.8
TRIGLYCERIDES: 108 (ref 40–160)

## 2017-04-06 LAB — TSH: TSH: 4.01 (ref 0.41–5.90)

## 2017-04-13 DIAGNOSIS — E039 Hypothyroidism, unspecified: Secondary | ICD-10-CM | POA: Diagnosis not present

## 2017-04-19 ENCOUNTER — Ambulatory Visit (INDEPENDENT_AMBULATORY_CARE_PROVIDER_SITE_OTHER): Payer: Medicare Other | Admitting: *Deleted

## 2017-04-19 DIAGNOSIS — Z8679 Personal history of other diseases of the circulatory system: Secondary | ICD-10-CM | POA: Diagnosis not present

## 2017-04-19 DIAGNOSIS — Z952 Presence of prosthetic heart valve: Secondary | ICD-10-CM | POA: Diagnosis not present

## 2017-04-19 DIAGNOSIS — Z5181 Encounter for therapeutic drug level monitoring: Secondary | ICD-10-CM | POA: Diagnosis not present

## 2017-04-19 DIAGNOSIS — Z9889 Other specified postprocedural states: Secondary | ICD-10-CM

## 2017-04-19 LAB — POCT INR: INR: 1.8

## 2017-04-20 ENCOUNTER — Ambulatory Visit: Payer: Medicare Other | Admitting: Neurology

## 2017-04-27 ENCOUNTER — Ambulatory Visit (INDEPENDENT_AMBULATORY_CARE_PROVIDER_SITE_OTHER): Payer: Medicare Other | Admitting: *Deleted

## 2017-04-27 ENCOUNTER — Other Ambulatory Visit: Payer: Self-pay | Admitting: *Deleted

## 2017-04-27 DIAGNOSIS — Z9889 Other specified postprocedural states: Secondary | ICD-10-CM

## 2017-04-27 DIAGNOSIS — Z8679 Personal history of other diseases of the circulatory system: Secondary | ICD-10-CM | POA: Diagnosis not present

## 2017-04-27 DIAGNOSIS — Z952 Presence of prosthetic heart valve: Secondary | ICD-10-CM

## 2017-04-27 DIAGNOSIS — Z5181 Encounter for therapeutic drug level monitoring: Secondary | ICD-10-CM

## 2017-04-27 LAB — POCT INR: INR: 1.5

## 2017-04-27 MED ORDER — METOPROLOL TARTRATE 25 MG PO TABS: 25.0000 mg | ORAL_TABLET | Freq: Two times a day (BID) | ORAL | 0 refills | Status: DC

## 2017-05-03 DIAGNOSIS — E039 Hypothyroidism, unspecified: Secondary | ICD-10-CM | POA: Diagnosis not present

## 2017-05-10 ENCOUNTER — Ambulatory Visit (INDEPENDENT_AMBULATORY_CARE_PROVIDER_SITE_OTHER): Payer: Medicare Other | Admitting: *Deleted

## 2017-05-10 DIAGNOSIS — Z952 Presence of prosthetic heart valve: Secondary | ICD-10-CM | POA: Diagnosis not present

## 2017-05-10 DIAGNOSIS — Z5181 Encounter for therapeutic drug level monitoring: Secondary | ICD-10-CM

## 2017-05-10 DIAGNOSIS — Z8679 Personal history of other diseases of the circulatory system: Secondary | ICD-10-CM | POA: Diagnosis not present

## 2017-05-10 DIAGNOSIS — Z9889 Other specified postprocedural states: Secondary | ICD-10-CM | POA: Diagnosis not present

## 2017-05-10 LAB — POCT INR: INR: 2.4

## 2017-05-10 MED ORDER — WARFARIN SODIUM 5 MG PO TABS: ORAL_TABLET | ORAL | 0 refills | Status: DC

## 2017-05-20 ENCOUNTER — Ambulatory Visit (INDEPENDENT_AMBULATORY_CARE_PROVIDER_SITE_OTHER): Payer: Medicare Other | Admitting: Pharmacist

## 2017-05-20 DIAGNOSIS — Z8679 Personal history of other diseases of the circulatory system: Secondary | ICD-10-CM

## 2017-05-20 DIAGNOSIS — Z952 Presence of prosthetic heart valve: Secondary | ICD-10-CM | POA: Diagnosis not present

## 2017-05-20 DIAGNOSIS — Z9889 Other specified postprocedural states: Secondary | ICD-10-CM

## 2017-05-20 DIAGNOSIS — Z5181 Encounter for therapeutic drug level monitoring: Secondary | ICD-10-CM

## 2017-05-20 LAB — POCT INR: INR: 2.5

## 2017-05-25 ENCOUNTER — Ambulatory Visit: Payer: Medicare Other | Admitting: Neurology

## 2017-06-08 ENCOUNTER — Ambulatory Visit (INDEPENDENT_AMBULATORY_CARE_PROVIDER_SITE_OTHER): Payer: Medicare Other | Admitting: *Deleted

## 2017-06-08 DIAGNOSIS — Z9889 Other specified postprocedural states: Secondary | ICD-10-CM | POA: Diagnosis not present

## 2017-06-08 DIAGNOSIS — Z5181 Encounter for therapeutic drug level monitoring: Secondary | ICD-10-CM

## 2017-06-08 DIAGNOSIS — Z8679 Personal history of other diseases of the circulatory system: Secondary | ICD-10-CM

## 2017-06-08 DIAGNOSIS — Z952 Presence of prosthetic heart valve: Secondary | ICD-10-CM | POA: Diagnosis not present

## 2017-06-08 LAB — POCT INR: INR: 1.8

## 2017-06-11 ENCOUNTER — Encounter: Payer: Self-pay | Admitting: *Deleted

## 2017-06-11 ENCOUNTER — Telehealth: Payer: Self-pay | Admitting: *Deleted

## 2017-06-11 NOTE — Telephone Encounter (Signed)
Sent message in My Chart patient overdue for 6 month follow up appt and [employer] Well[employer]

## 2017-06-15 ENCOUNTER — Ambulatory Visit: Payer: Medicare Other | Admitting: Neurology

## 2017-06-22 ENCOUNTER — Ambulatory Visit (INDEPENDENT_AMBULATORY_CARE_PROVIDER_SITE_OTHER): Payer: Medicare Other

## 2017-06-22 DIAGNOSIS — Z952 Presence of prosthetic heart valve: Secondary | ICD-10-CM | POA: Diagnosis not present

## 2017-06-22 DIAGNOSIS — Z5181 Encounter for therapeutic drug level monitoring: Secondary | ICD-10-CM

## 2017-06-22 DIAGNOSIS — Z8679 Personal history of other diseases of the circulatory system: Secondary | ICD-10-CM | POA: Diagnosis not present

## 2017-06-22 DIAGNOSIS — I509 Heart failure, unspecified: Secondary | ICD-10-CM

## 2017-06-22 DIAGNOSIS — Z9889 Other specified postprocedural states: Secondary | ICD-10-CM | POA: Diagnosis not present

## 2017-06-22 LAB — POCT INR: INR: 3.5

## 2017-06-24 ENCOUNTER — Encounter: Payer: Self-pay | Admitting: Neurology

## 2017-06-24 ENCOUNTER — Ambulatory Visit (INDEPENDENT_AMBULATORY_CARE_PROVIDER_SITE_OTHER): Payer: Medicare Other | Admitting: Neurology

## 2017-06-24 VITALS — BP 118/60 | HR 86 | Ht 69.0 in | Wt 134.8 lb

## 2017-06-24 DIAGNOSIS — H8110 Benign paroxysmal vertigo, unspecified ear: Secondary | ICD-10-CM | POA: Diagnosis not present

## 2017-06-24 DIAGNOSIS — G35 Multiple sclerosis: Secondary | ICD-10-CM

## 2017-06-24 NOTE — Patient Instructions (Signed)
1.  Recommend starting Ocrevus.  Please review information and get back to me as soon as possible 2.  Continue the dizzy exercises and contact me in 2 weeks. 3.  Take the Ditropan 4.  Follow up in 6 months.

## 2017-06-24 NOTE — Progress Notes (Signed)
NEUROLOGY FOLLOW UP OFFICE NOTE  [name] [id number]  HISTORY OF PRESENT ILLNESS: [name] [name] is a 66 year old right-handed female with COPD, CHF, mitral valve replacement, hypothyroidism, who follows up for multiple sclerosis.  UPDATE: MRI of brain without contrast from 02/18/17 was personally reviewed and was stable compared to prior imaging from 07/11/14.  It revealed multiple lesions in the periventricular and juxta cortical white matter, as well as within the right brachium pontis.  MRI of cervical spine revealed thin appearance of cervical cord without demyelination.  She was advised to follow up to discuss treatment options.  She is also taking D3 6000 IU daily.  For 2 weeks, she reports brief spinning sensation with head movements.     HISTORY: She was diagnosed with multiple sclerosis in 1994.  At the time, she exhibited tingling in her hands as well as frequent falls.  She saw a neurologist who performed an MRI of the brain that revealed white matter lesions.  She underwent a lumbar puncture which reportedly demonstrated possible elevated IgG index.  It was recommended to start Avonex, but she declined, fearing potential side effects.  She has never been on disease modifying therapy.   She has not had any clear MS flares.  She has chronic symptoms, such as falls, neurogenic bladder and occasional tingling in the left hand.  She used to have muscle spasms in the right arm many years ago, which has resolved.  Over the past several months, she has started to use a rolling walker due to increased gait difficulty which she attributes to her MS-related chronic gait instability, as well as arthritis and scoliosis.  She currently is undergoing physical therapy and sees a Physiological scientist once a week.  She has a urologist.   She has never had optic neuritis or focal/unilateral weakness.    MRI of brain without contrast was performed on 07/11/14 revealed supratentorial white matter  lesions consistent with demyelinating disease as well as diffuse cortical atrophy.   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  She has sustained L2 and L5 compression fractures with height loss in the past due to her falls.  PAST MEDICAL HISTORY: Past Medical History:  Diagnosis Date  . Anxiety   . Arthritis    knees  . Breast cancer ([place]) 1999  . CHF (congestive heart failure) ([place])    Related to severe mitral regurgitation, April, 2013  . 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 Assess by pulmonary team in the hospital April, 2013  . Ejection fraction    EF 60%, echo, April, 2013, with severe MR before mitral valve replacement  . Herpes   . Hypothyroidism   . IBS (irritable bowel syndrome)   . Mitral valve regurgitation    Mitral valve replacement April, 2013, Mitral valve prolapse  . Multiple sclerosis ([place])   . Neurogenic bladder   . Osteoporosis   . Ovarian cyst   . Paroxysmal atrial fibrillation (H[facility])    Rapid atrial fibrillation in-hospital, Rapid cardioversion,  before mitral valve surgery  . Pulmonary hypertension ([place])    Echo, April, 2013, before mitral valve surgery  . S/P Maze operation for atrial fibrillation 01/26/2012   Complete biatrial lesion set using cryothermy via right mini thoracotomy  . S/P mitral valve replacement 01/26/2012   79mm Sorin Carbomedics Optiform mechanical prosthesis via  right mini thoracotomy  . Warfarin anticoagulation    Mechanical mitral prosthesis, April, 20136    MEDICATIONS: Current Outpatient Prescriptions on File Prior to Visit  Medication Sig Dispense Refill  . Biotin 5000 MCG CAPS Take 2,000 mcg by mouth.    . Black Cohosh 40 MG CAPS Take 1 capsule by mouth daily.    . Bromelains (BROMELAIN PO) Take 1 capsule by mouth 2 (two) times daily.    .  Cholecalciferol (VITAMIN D3) 2000 units capsule Take 2,000 Units by mouth daily.    . Coenzyme Q10 (CO Q-10) 100 MG CAPS Take 1 capsule by mouth daily.     . Cranberry 500 MG CAPS Take 1 capsule by mouth daily.    Marland Kitchen L-THEANINE PO Take 1 capsule by mouth daily.     Marland Kitchen lactose free nutrition (BOOST PLUS) LIQD Take 237 mLs by mouth daily.     Marland Kitchen lidocaine-prilocaine (EMLA) cream Apply 1 application topically as needed (per pt this is for use before blood draws).   0  . Lysine 500 MG CAPS Take 1 capsule by mouth daily.    . metoprolol tartrate (LOPRESSOR) 25 MG tablet Take 1 tablet (25 mg total) by mouth 2 (two) times daily. Keep upcoming appointment for further refills! 180 tablet 0  . MILK THISTLE PO Take 1 capsule by mouth 2 (two) times daily.    . Misc Natural Products (GLUCOSAMINE CHOND COMPLEX/MSM PO) Strength of dose Glucosamine 1500mg  /Chodroitron 1000mg / MSM 500mg  takes one twice daily    . Olive Leaf 500 MG CAPS Take 1 capsule by mouth 2 (two) times daily.     Marland Kitchen OVER THE COUNTER MEDICATION Red marine algae - 1 capsules daily    . OVER THE COUNTER MEDICATION Take 2 capsules by mouth daily. Mushroom Extract    . Petasin (PETADOLEX PO) Take by mouth 2 (two) times daily.    . Probiotic Product (PROBIOTIC DAILY PO) Take 1 capsule once a day    . PROGESTERONE MICRONIZED PO Take 75 mg by mouth daily.     . Pumpkin Seed (URIPLEX) 500 MG TABS Take 1 tablet by mouth 2 (two) times daily.    Marland Kitchen thyroid (ARMOUR) 90 MG tablet Take 90 mg by mouth daily.    . TURMERIC PO Take by mouth.    . vitamin A 10000 UNIT capsule Take 10,000 Units by mouth daily.    . vitamin E 400 UNIT capsule Take 400 Units by mouth daily.    Marland Kitchen warfarin (COUMADIN) 5 MG tablet Take as directed by Coumadin clinic 90 tablet 0   No current facility-administered medications on file prior to visit.     ALLERGIES: Allergies  Allergen Reactions  . Erythromycin Nausea And Vomiting    FAMILY HISTORY: Family History  Problem  Relation Age of Onset  . Heart disease Father        cardiac arrest   . CAD Father   . Hypertension Father   . CAD Mother        5 stents and numerous bypass surgery  . Hypertension Mother   . Hyperlipidemia Mother   . CAD Unknown   . Breast cancer Maternal Grandmother   . Breast cancer Maternal Aunt     SOCIAL HISTORY: Social History   Social History  . Marital status: Widowed    Spouse name: N/A  . Number of children: 2  . Years of education: 16   Occupational History  . Retired    Social History  Main Topics  . Smoking status: Never Smoker  . Smokeless tobacco: Never Used  . Alcohol use No  . Drug use: No  . Sexual activity: No   Other Topics Concern  . Not on file   Social History Narrative   Patient is a widower (since 48), he currently lives with her son and daughter-in-law. She has 2 children.   She is college educated, and retired from the [employer].   She uses herbal remedies, and multiple over-the-counter supplements. She takes a daily vitamin.   She wears her seatbelt, exercises routinely, smoke detector in the home.   Requires a walker or wheelchair at times.   Feels safe in her relationships.    REVIEW OF SYSTEMS: Constitutional: No fevers, chills, or sweats, no generalized fatigue, change in appetite Eyes: No visual changes, double vision, eye pain Ear, nose and throat: No hearing loss, ear pain, nasal congestion, sore throat Cardiovascular: No chest pain, palpitations Respiratory:  No shortness of breath at rest or with exertion, wheezes GastrointestinaI: No nausea, vomiting, diarrhea, abdominal pain, fecal incontinence Genitourinary:  No dysuria, urinary retention or frequency Musculoskeletal:  No neck pain, back pain Integumentary: No rash, pruritus, skin lesions Neurological: as above Psychiatric: No depression, insomnia, anxiety Endocrine: No palpitations, fatigue, diaphoresis, mood swings, change in appetite, change in weight,  increased thirst Hematologic/Lymphatic:  No purpura, petechiae. Allergic/Immunologic: no itchy/runny eyes, nasal congestion, recent allergic reactions, rashes  PHYSICAL EXAM: Vitals:   06/24/17 1445  BP: 118/60  Pulse: 86  SpO2: 97%   General: No acute distress.  Patient appears well-groomed.   Head:  Normocephalic/atraumatic Eyes:  Fundi examined but not visualized Neck: supple, no paraspinal tenderness, full range of motion Heart:  Regular rate and rhythm Lungs:  Clear to auscultation bilaterally Back: No paraspinal tenderness Neurological Exam: alert and oriented to person, place, and time. Attention span and concentration intact, recent and remote memory intact, fund of knowledge intact.  Speech fluent and not dysarthric, language intact.  CN II-XII intact. Decreased bulk in lower extremities, 4+/5 bilateral hip flexion, 5-/5 knee extension/flexion, otherwise muscle strength 5/5 throughout.  Sensation to light touch intact.  Deep tendon reflexes 2+ throughout, bilateral Babinski.  Finger to nose testing intact.  Wide-based gait with torso side bent to right.  IMPRESSION: 1.  Multiple sclerosis.  Based on disease progression, she may have primary progressive form.  History is not suggestive of relapsing-remitting form. 2.  Benign paroxysmal positional vertigo  PLAN: 1.  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  She was provided information and will get back to [place]. 2.  Advised to try Epley maneuver and will refer to vestibular rehab if not resolved in 2 weeks. 3.  Continue D3 and Biotin 4.  Follow up in 6 months.  28 minutes spent face to face with patient, over 50% spent discussing diagnosis and treatment options.  Metta Clines, DO  CC:  Howard Pouch, DO

## 2017-07-08 ENCOUNTER — Ambulatory Visit (INDEPENDENT_AMBULATORY_CARE_PROVIDER_SITE_OTHER): Payer: Medicare Other | Admitting: *Deleted

## 2017-07-08 DIAGNOSIS — Z5181 Encounter for therapeutic drug level monitoring: Secondary | ICD-10-CM

## 2017-07-08 DIAGNOSIS — Z8679 Personal history of other diseases of the circulatory system: Secondary | ICD-10-CM | POA: Diagnosis not present

## 2017-07-08 DIAGNOSIS — Z9889 Other specified postprocedural states: Secondary | ICD-10-CM

## 2017-07-08 DIAGNOSIS — Z952 Presence of prosthetic heart valve: Secondary | ICD-10-CM | POA: Diagnosis not present

## 2017-07-08 DIAGNOSIS — I480 Paroxysmal atrial fibrillation: Secondary | ICD-10-CM | POA: Diagnosis not present

## 2017-07-08 DIAGNOSIS — I509 Heart failure, unspecified: Secondary | ICD-10-CM

## 2017-07-08 LAB — POCT INR: INR: 3.8

## 2017-07-14 ENCOUNTER — Encounter: Payer: Self-pay | Admitting: Cardiology

## 2017-07-22 ENCOUNTER — Ambulatory Visit: Payer: Medicare Other | Admitting: Cardiology

## 2017-07-26 DIAGNOSIS — G35 Multiple sclerosis: Secondary | ICD-10-CM | POA: Diagnosis not present

## 2017-07-29 ENCOUNTER — Other Ambulatory Visit: Payer: Self-pay | Admitting: Cardiology

## 2017-08-03 ENCOUNTER — Ambulatory Visit (INDEPENDENT_AMBULATORY_CARE_PROVIDER_SITE_OTHER): Payer: Medicare Other | Admitting: *Deleted

## 2017-08-03 DIAGNOSIS — Z5181 Encounter for therapeutic drug level monitoring: Secondary | ICD-10-CM

## 2017-08-03 DIAGNOSIS — Z952 Presence of prosthetic heart valve: Secondary | ICD-10-CM

## 2017-08-03 DIAGNOSIS — I480 Paroxysmal atrial fibrillation: Secondary | ICD-10-CM | POA: Diagnosis not present

## 2017-08-03 DIAGNOSIS — Z8679 Personal history of other diseases of the circulatory system: Secondary | ICD-10-CM

## 2017-08-03 DIAGNOSIS — Z9889 Other specified postprocedural states: Secondary | ICD-10-CM | POA: Diagnosis not present

## 2017-08-03 DIAGNOSIS — I509 Heart failure, unspecified: Secondary | ICD-10-CM

## 2017-08-03 LAB — POCT INR: INR: 2

## 2017-08-15 DIAGNOSIS — G35 Multiple sclerosis: Secondary | ICD-10-CM | POA: Diagnosis not present

## 2017-08-17 ENCOUNTER — Ambulatory Visit (INDEPENDENT_AMBULATORY_CARE_PROVIDER_SITE_OTHER): Payer: Medicare Other | Admitting: *Deleted

## 2017-08-17 ENCOUNTER — Encounter: Payer: Self-pay | Admitting: Cardiology

## 2017-08-17 ENCOUNTER — Ambulatory Visit (INDEPENDENT_AMBULATORY_CARE_PROVIDER_SITE_OTHER): Payer: Medicare Other | Admitting: Cardiology

## 2017-08-17 VITALS — BP 142/68 | HR 60 | Resp 16 | Ht 69.0 in | Wt 135.8 lb

## 2017-08-17 DIAGNOSIS — I440 Atrioventricular block, first degree: Secondary | ICD-10-CM | POA: Diagnosis not present

## 2017-08-17 DIAGNOSIS — Z952 Presence of prosthetic heart valve: Secondary | ICD-10-CM

## 2017-08-17 DIAGNOSIS — Z5181 Encounter for therapeutic drug level monitoring: Secondary | ICD-10-CM

## 2017-08-17 DIAGNOSIS — I480 Paroxysmal atrial fibrillation: Secondary | ICD-10-CM

## 2017-08-17 DIAGNOSIS — Z9889 Other specified postprocedural states: Secondary | ICD-10-CM | POA: Diagnosis not present

## 2017-08-17 DIAGNOSIS — I509 Heart failure, unspecified: Secondary | ICD-10-CM

## 2017-08-17 DIAGNOSIS — Z8679 Personal history of other diseases of the circulatory system: Secondary | ICD-10-CM | POA: Diagnosis not present

## 2017-08-17 LAB — POCT INR: INR: 2.8

## 2017-08-17 NOTE — Progress Notes (Signed)
08/17/2017 [name] [name]   [birth year]  [id number]  Primary Physician Ma Hillock, DO Primary Cardiologist: Dr. Meda Coffee   Reason for Visit/CC: f/u for mechanical mitral valve and PAF  HPI:  66 y/o female with h/o MR and PAF. She underwent MVR with a mechanical mitral valve in 2014 and also a maze procedure. She is on chronic coumadin. INR is followed in our office. Her last 2D echo was 10/2015. LVEF was normal. Mitral valve functioning normally. She also has a h/o multiple sclerosis and breast cancer. She is routinely followed in the Coumadin clinic but was lost to follow-up in general cardiology.  Her last office visit was December 2016.  She presents back to clinic for repeat evaluation.  She reports that she has done well since her last office visit.  She denies any chest pain.  No dyspnea.  INR has been checked today and is therapeutic at 2.8. EKG shows SR with PVCs. Asymptomatic.    Current Meds  Medication Sig  . Biotin 5000 MCG CAPS Take 2,000 mcg by mouth.  . Black Cohosh 40 MG CAPS Take 1 capsule by mouth daily.  . Bromelains (BROMELAIN PO) Take 1 capsule by mouth 2 (two) times daily.  . calcium carbonate (OSCAL) 1500 (600 Ca) MG TABS tablet Takes 1 time daily  . Cholecalciferol (VITAMIN D3) 2000 units capsule Take 2,000 Units by mouth daily.  . Coenzyme Q10 (CO Q-10) 100 MG CAPS Take 1 capsule by mouth daily.   . Cranberry 500 MG CAPS Take 1 capsule by mouth daily.  Marland Kitchen L-THEANINE PO Take 1 capsule by mouth daily.   Marland Kitchen lactose free nutrition (BOOST PLUS) LIQD Take 237 mLs by mouth daily.   Marland Kitchen lidocaine-prilocaine (EMLA) cream Apply 1 application topically as needed (per pt this is for use before blood draws).   . Lysine 500 MG CAPS Take 1 capsule by mouth daily.  . magnesium oxide (MAG-OX) 400 MG tablet Takes 300 mg daily  . metoprolol tartrate (LOPRESSOR) 25 MG tablet take 1 tablet by mouth twice a day (KEEP APPOINTMENT FOR FUTURE REFILLS)  . MILK THISTLE PO Take 1 capsule by  mouth 2 (two) times daily.  . Misc Natural Products (GLUCOSAMINE CHOND COMPLEX/MSM PO) Strength of dose Glucosamine 1500mg  /Chodroitron 1000mg / MSM 500mg  takes one twice daily  . Olive Leaf 500 MG CAPS Take 1 capsule by mouth 2 (two) times daily.   Marland Kitchen OVER THE COUNTER MEDICATION Red marine algae - 1 capsules daily  . OVER THE COUNTER MEDICATION Take 2 capsules by mouth daily. Mushroom Extract  . Petasin (PETADOLEX PO) Take by mouth 2 (two) times daily.  . Probiotic Product (PROBIOTIC DAILY PO) Take 1 capsule once a day  . PROGESTERONE MICRONIZED PO Take 75 mg by mouth daily.   . Pumpkin Seed (URIPLEX) 500 MG TABS Take 1 tablet by mouth 2 (two) times daily.  Marland Kitchen thyroid (ARMOUR) 90 MG tablet Take 90 mg by mouth daily.  . TURMERIC PO Take by mouth.  . vitamin A 10000 UNIT capsule Take 10,000 Units by mouth daily.  . vitamin E 400 UNIT capsule Take 400 Units by mouth daily.  Marland Kitchen warfarin (COUMADIN) 5 MG tablet Take as directed by Coumadin clinic   Allergies  Allergen Reactions  . Erythromycin Nausea And Vomiting   Past Medical History:  Diagnosis Date  . Anxiety   . Arthritis    knees  . Breast cancer ([place]) 1999  . CHF (congestive heart failure) ([place])  Related to severe mitral regurgitation, April, 2013  . 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 Assess by pulmonary team in the hospital April, 2013  . Ejection fraction    EF 60%, echo, April, 2013, with severe MR before mitral valve replacement  . Herpes   . Hypothyroidism   . IBS (irritable bowel syndrome)   . Mitral valve regurgitation    Mitral valve replacement April, 2013, Mitral valve prolapse  . Multiple sclerosis ([place])   . Neurogenic bladder   . Osteoporosis   . Ovarian cyst   . Paroxysmal atrial fibrillation (H[facility])    Rapid atrial fibrillation in-hospital, Rapid cardioversion,  before mitral valve surgery  . Pulmonary hypertension ([place])    Echo, April, 2013, before mitral valve surgery    . S/P Maze operation for atrial fibrillation 01/26/2012   Complete biatrial lesion set using cryothermy via right mini thoracotomy  . S/P mitral valve replacement 01/26/2012   24mm Sorin Carbomedics Optiform mechanical prosthesis via right mini thoracotomy  . Warfarin anticoagulation    Mechanical mitral prosthesis, April, 20136   Family History  Problem Relation Age of Onset  . Heart disease Father        cardiac arrest   . CAD Father   . Hypertension Father   . CAD Mother        5 stents and numerous bypass surgery  . Hypertension Mother   . Hyperlipidemia Mother   . CAD Unknown   . Breast cancer Maternal Grandmother   . Breast cancer Maternal Aunt    Past Surgical History:  Procedure Laterality Date  . BREAST LUMPECTOMY Right 1999   with sent.node, and axillary dissection (18)  . CHEST TUBE INSERTION  01/26/2012   Procedure: CHEST TUBE INSERTION;  Surgeon: Rexene Alberts, MD;  Location: [place];  Service: Open Heart Surgery;  Laterality: Left;  . COLONOSCOPY  2010   "normal"  . CYSTOSCOPY  1992  . LAPAROSCOPIC OVARIAN CYSTECTOMY  1978   urethral stricture repair  . LEFT AND RIGHT HEART CATHETERIZATION WITH CORONARY ANGIOGRAM N/A 01/20/2012   Procedure: LEFT AND RIGHT HEART CATHETERIZATION WITH CORONARY ANGIOGRAM;  Surgeon: Burnell Blanks, MD;  Location: [facility] CATH LAB;  Service: Cardiovascular;  Laterality: N/A;  . LYMPHADENECTOMY    . MAZE  01/26/2012   Procedure: MAZE;  Surgeon: Rexene Alberts, MD;  Location: [place];  Service: Open Heart Surgery;  Laterality: N/A;  . MITRAL VALVE REPLACEMENT  01/26/2012   Procedure: MINIMALLY INVASIVE MITRAL VALVE (MV) REPLACEMENT;  Surgeon: Rexene Alberts, MD;  Location: [place];  Service: Open Heart Surgery;  Laterality: Right;  . TEE WITHOUT CARDIOVERSION  01/19/2012   Procedure: TRANSESOPHAGEAL ECHOCARDIOGRAM (TEE);  Surgeon: Peter M Martinique, MD;  Location: [facility] ENDOSCOPY;  Service: Cardiovascular;  Laterality: N/A;  . TONSILLECTOMY  1970  .  [place]  . WRIST SURGERY Right 2012   Social History   Social History  . Marital status: Widowed    Spouse name: N/A  . Number of children: 2  . Years of education: 16   Occupational History  . Retired    Social History Main Topics  . Smoking status: Never Smoker  . Smokeless tobacco: Never Used  . Alcohol use No  . Drug use: No  . Sexual activity: No   Other Topics Concern  . Not on file   Social History Narrative   Patient is a widower (since 20), he currently  lives with her son and daughter-in-law. She has 2 children.   She is college educated, and retired from the [employer].   She uses herbal remedies, and multiple over-the-counter supplements. She takes a daily vitamin.   She wears her seatbelt, exercises routinely, smoke detector in the home.   Requires a walker or wheelchair at times.   Feels safe in her relationships.     Review of Systems: General: negative for chills, fever, night sweats or weight changes.  Cardiovascular: negative for chest pain, dyspnea on exertion, edema, orthopnea, palpitations, paroxysmal nocturnal dyspnea or shortness of breath Dermatological: negative for rash Respiratory: negative for cough or wheezing Urologic: negative for hematuria Abdominal: negative for nausea, vomiting, diarrhea, bright red blood per rectum, melena, or hematemesis Neurologic: negative for visual changes, syncope, or dizziness All other systems reviewed and are otherwise negative except as noted above.   Physical Exam:  Blood pressure (!) 142/68, pulse 60, resp. rate 16, height 5\' 9"  (1.753 m), weight 135 lb 12.8 oz (61.6 kg), SpO2 97 %.  General appearance: alert, cooperative and no distress Neck: no carotid bruit and no JVD Lungs: clear to auscultation bilaterally Heart: RRR and crisp mechanical valve sounds Extremities: extremities normal, atraumatic, no cyanosis or edema Pulses: 2+ and symmetric Skin: Skin color, texture,  turgor normal. No rashes or lesions Neurologic: Grossly normal  EKG NSR with PVCs= -- personally reviewed   ASSESSMENT AND PLAN:   1. H/o Severe MR: s/p MV replacement with mechanical valve in 2014 by Dr. Roxy Manns. On chronic coumadin. Stable w/o CP or dyspnea. Due for f/u echo. Repeat in the next 2-3 months.   2. H/o Atrial Fibrillation: s/p Maze procedure at time of valve replacement. On chronic coumadin. SR on EKG. Denies any palpitations.   3. Chronic coumadin: for mechanical heart valve. INR checked today and therapeutic at 2.8.    Follow-Up w/ Dr. Meda Coffee in 1 year   Raza Bayless Ladoris Gene, MHS [facility] 08/17/2017 4:12 PM

## 2017-08-17 NOTE — Patient Instructions (Addendum)
Medication Instructions:  Your physician recommends that you continue on your current medications as directed. Please refer to the Current Medication list given to you today.  -- If you need a refill on your cardiac medications before your next appointment, please call your pharmacy. --  Labwork: None ordered  Testing/Procedures: Your physician has requested that you have an echocardiogram. Echocardiography is a painless test that uses sound waves to create images of your heart. It provides your doctor with information about the size and shape of your heart and how well your heart's chambers and valves are working. This procedure takes approximately one hour. There are no restrictions for this procedure.  Follow-Up: Your physician wants you to follow-up in: 1 year with Dr. Meda Coffee.  You will receive a reminder letter in the mail two months in advance. If you don't receive a letter, please call our office to schedule the follow-up appointment.  Thank you for choosing [facility]!!

## 2017-08-24 ENCOUNTER — Telehealth: Payer: Self-pay | Admitting: *Deleted

## 2017-08-24 NOTE — Telephone Encounter (Signed)
-----   Message from Lenard Galloway V sent at 08/24/2017 10:43 AM EST ----- Regarding: echocardiogram Just an FYI. I called patient to schedule echocardiogram and she doesn't want to schedule until 10/22/17.   Lorriane Shire

## 2017-09-13 ENCOUNTER — Ambulatory Visit (INDEPENDENT_AMBULATORY_CARE_PROVIDER_SITE_OTHER): Payer: Medicare Other | Admitting: *Deleted

## 2017-09-13 DIAGNOSIS — Z5181 Encounter for therapeutic drug level monitoring: Secondary | ICD-10-CM

## 2017-09-13 DIAGNOSIS — Z8679 Personal history of other diseases of the circulatory system: Secondary | ICD-10-CM | POA: Diagnosis not present

## 2017-09-13 DIAGNOSIS — I509 Heart failure, unspecified: Secondary | ICD-10-CM

## 2017-09-13 DIAGNOSIS — Z952 Presence of prosthetic heart valve: Secondary | ICD-10-CM

## 2017-09-13 DIAGNOSIS — Z9889 Other specified postprocedural states: Secondary | ICD-10-CM | POA: Diagnosis not present

## 2017-09-13 LAB — POCT INR: INR: 2.7

## 2017-09-13 MED ORDER — WARFARIN SODIUM 5 MG PO TABS: ORAL_TABLET | ORAL | 0 refills | Status: DC

## 2017-09-13 NOTE — Patient Instructions (Signed)
Continue taking 1 tablet everyday except 1/2 tablet on Sundays.  Recheck in 4 weeks. Call with any new medications or procedures [phone]

## 2017-10-25 DIAGNOSIS — G35 Multiple sclerosis: Secondary | ICD-10-CM | POA: Diagnosis not present

## 2017-10-26 ENCOUNTER — Ambulatory Visit (INDEPENDENT_AMBULATORY_CARE_PROVIDER_SITE_OTHER): Payer: Medicare Other | Admitting: *Deleted

## 2017-10-26 DIAGNOSIS — Z952 Presence of prosthetic heart valve: Secondary | ICD-10-CM | POA: Diagnosis not present

## 2017-10-26 DIAGNOSIS — I509 Heart failure, unspecified: Secondary | ICD-10-CM

## 2017-10-26 DIAGNOSIS — Z9889 Other specified postprocedural states: Secondary | ICD-10-CM | POA: Diagnosis not present

## 2017-10-26 DIAGNOSIS — Z5181 Encounter for therapeutic drug level monitoring: Secondary | ICD-10-CM

## 2017-10-26 DIAGNOSIS — Z8679 Personal history of other diseases of the circulatory system: Secondary | ICD-10-CM

## 2017-10-26 DIAGNOSIS — I480 Paroxysmal atrial fibrillation: Secondary | ICD-10-CM | POA: Diagnosis not present

## 2017-10-26 LAB — POCT INR: INR: 2.7

## 2017-10-26 NOTE — Patient Instructions (Signed)
Description    Continue taking 1 tablet everyday except 1/2 tablet on Sundays.  Recheck in 4 weeks. Call with any new medications or procedures [phone]      

## 2017-10-29 ENCOUNTER — Other Ambulatory Visit: Payer: Self-pay | Admitting: Cardiology

## 2017-11-09 ENCOUNTER — Ambulatory Visit (HOSPITAL_COMMUNITY): Payer: Medicare Other | Attending: Cardiology

## 2017-11-09 ENCOUNTER — Other Ambulatory Visit: Payer: Self-pay

## 2017-11-09 DIAGNOSIS — Z952 Presence of prosthetic heart valve: Secondary | ICD-10-CM | POA: Diagnosis not present

## 2017-11-16 ENCOUNTER — Telehealth: Payer: Self-pay | Admitting: *Deleted

## 2017-11-16 MED ORDER — LISINOPRIL 5 MG PO TABS: 5.0000 mg | ORAL_TABLET | Freq: Every day | ORAL | 3 refills | Status: DC

## 2017-11-16 NOTE — Telephone Encounter (Signed)
Notified the pt that per Dr Meda Coffee, her echo showed that her EF is mildly reduced when compared to her prior echo, so she recommends that we add lisinopril 5 mg po daily to her regimen.  Also informed the pt that per Dr Meda Coffee, her mechanical mitral valve is functioning properly.  Confirmed the pharmacy of choice with the pt. Pt verbalized understanding and agrees with this plan.

## 2017-11-16 NOTE — Telephone Encounter (Signed)
-----   Message from Dorothy Spark, MD sent at 11/15/2017  5:32 PM EST ----- EF is mildly reduced when compared to prior Study. Please start lisinopril 5 mg po daily. Mechanical mitral valve is functioning properly.

## 2017-11-23 ENCOUNTER — Ambulatory Visit (INDEPENDENT_AMBULATORY_CARE_PROVIDER_SITE_OTHER): Payer: Medicare Other | Admitting: *Deleted

## 2017-11-23 DIAGNOSIS — I509 Heart failure, unspecified: Secondary | ICD-10-CM

## 2017-11-23 DIAGNOSIS — Z5181 Encounter for therapeutic drug level monitoring: Secondary | ICD-10-CM

## 2017-11-23 DIAGNOSIS — I480 Paroxysmal atrial fibrillation: Secondary | ICD-10-CM | POA: Diagnosis not present

## 2017-11-23 DIAGNOSIS — Z9889 Other specified postprocedural states: Secondary | ICD-10-CM | POA: Diagnosis not present

## 2017-11-23 DIAGNOSIS — Z952 Presence of prosthetic heart valve: Secondary | ICD-10-CM | POA: Diagnosis not present

## 2017-11-23 DIAGNOSIS — Z8679 Personal history of other diseases of the circulatory system: Secondary | ICD-10-CM

## 2017-11-23 LAB — POCT INR: INR: 3.1

## 2017-11-23 NOTE — Patient Instructions (Signed)
Description   Continue taking 1 tablet everyday except 1/2 tablet on Sundays.  Recheck in 4 weeks. Call with any new medications or procedures [phone] will fax this encounter to Dr [facility] office as requested

## 2017-11-26 ENCOUNTER — Telehealth: Payer: Self-pay | Admitting: *Deleted

## 2017-11-26 NOTE — Telephone Encounter (Signed)
Spoke with [name] [name] at Dr [facility] office and gave her results of INR on 11/23/2017 INR 3.1 and the dose of coumadin she is to take coumadin 5mg  daily except 2.5mg  on Sundays  Her INR goal is 2.5 to 3.5  Tried 6 times to fax encounter and used 2 fax numbers and 2 different fax machines and could not get fax to go through

## 2017-11-26 NOTE — Telephone Encounter (Signed)
Dr [facility] office phone number is [phone]

## 2017-12-21 ENCOUNTER — Ambulatory Visit (INDEPENDENT_AMBULATORY_CARE_PROVIDER_SITE_OTHER): Payer: Medicare Other | Admitting: *Deleted

## 2017-12-21 DIAGNOSIS — Z5181 Encounter for therapeutic drug level monitoring: Secondary | ICD-10-CM

## 2017-12-21 DIAGNOSIS — I509 Heart failure, unspecified: Secondary | ICD-10-CM

## 2017-12-21 DIAGNOSIS — Z9889 Other specified postprocedural states: Secondary | ICD-10-CM

## 2017-12-21 DIAGNOSIS — Z952 Presence of prosthetic heart valve: Secondary | ICD-10-CM | POA: Diagnosis not present

## 2017-12-21 DIAGNOSIS — Z8679 Personal history of other diseases of the circulatory system: Secondary | ICD-10-CM | POA: Diagnosis not present

## 2017-12-21 LAB — POCT INR: INR: 2.3

## 2017-12-21 MED ORDER — WARFARIN SODIUM 5 MG PO TABS: ORAL_TABLET | ORAL | 0 refills | Status: DC

## 2017-12-21 NOTE — Patient Instructions (Addendum)
Description   Today take 1.5 tablets then continue taking 1 tablet everyday except 1/2 tablet on Sundays.  Recheck in 3 weeks. Call with any new medications or procedures [phone]      

## 2017-12-23 ENCOUNTER — Ambulatory Visit (INDEPENDENT_AMBULATORY_CARE_PROVIDER_SITE_OTHER): Payer: Medicare Other | Admitting: Neurology

## 2017-12-23 ENCOUNTER — Encounter: Payer: Self-pay | Admitting: Neurology

## 2017-12-23 VITALS — BP 122/64 | HR 78 | Resp 16 | Ht 69.0 in | Wt 137.0 lb

## 2017-12-23 DIAGNOSIS — G35 Multiple sclerosis: Secondary | ICD-10-CM

## 2017-12-23 NOTE — Patient Instructions (Signed)
1.  Continue D3 6000 IU daily 2.  Consider the following medications:  Tecfedra, Aubagio 3.  Follow up in one year

## 2017-12-23 NOTE — Progress Notes (Signed)
NEUROLOGY FOLLOW UP OFFICE NOTE  YAILEEN [name] [id number]  HISTORY OF PRESENT ILLNESS: [name] [name] is a 67 year old right-handed female with COPD, CHF, mitral valve replacement, hypothyroidism, who follows up for  multiple sclerosis.   UPDATE: She is taking D3 6000 IU daily.  Last visit, she was recommended Ocrevus but she deferred at that time. She says she is feeling better.  She is now using a cane instead of a walker.  She has started seeing a homeopath and is taking several supplements.  At this time, she still does not want to start a disease-modifying agent.   HISTORY: She was diagnosed with multiple sclerosis in 1994.  At the time, she exhibited tingling in her hands as well as frequent falls.  She saw a neurologist who performed an MRI of the brain that revealed white matter lesions.  She underwent a lumbar puncture which reportedly demonstrated possible elevated IgG index.  It was recommended to start Avonex, but she declined, fearing potential side effects.  She has never been on disease modifying therapy.   She has not had any clear MS flares.  She has chronic symptoms, such as falls, neurogenic bladder and occasional tingling in the left hand.  She used to have muscle spasms in the right arm many years ago, which has resolved.  Over the past several months, she has started to use a rolling walker due to increased gait difficulty which she attributes to her MS-related chronic gait instability, as well as arthritis and scoliosis.  She currently is undergoing physical therapy and sees a Physiological scientist once a week.  She has a urologist.   She has never had optic neuritis or focal/unilateral weakness.    Imaging: MRI of brain without contrast was performed on 07/11/14 revealed supratentorial white matter lesions consistent with demyelinating disease as well as diffuse cortical atrophy.   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  She has sustained L2 and L5 compression fractures with height loss in the past due to her falls.  MRI of brain without contrast from 02/18/17 was personally reviewed and was stable compared to prior imaging from 07/11/14.  It revealed multiple lesions in the periventricular and juxta cortical white matter, as well as within the right brachium pontis.  MRI of cervical spine revealed thin appearance of cervical cord without demyelination.    PAST MEDICAL HISTORY: Past Medical History:  Diagnosis Date  . Anxiety   . Arthritis    knees  . Breast cancer ([place]) 1999  . CHF (congestive heart failure) ([place])    Related to severe mitral regurgitation, April, 2013  . 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 Assess by pulmonary team in the hospital April, 2013  . Ejection fraction    EF 60%, echo, April, 2013, with severe MR before mitral valve replacement  . Herpes   . Hypothyroidism   . IBS (irritable bowel syndrome)   . Mitral valve regurgitation    Mitral valve replacement April, 2013, Mitral valve prolapse  . Multiple sclerosis ([place])   . Neurogenic bladder   . Osteoporosis   . Ovarian cyst   . Paroxysmal atrial fibrillation ([facility])    Rapid atrial fibrillation in-hospital, Rapid cardioversion,  before mitral valve surgery  . Pulmonary hypertension ([place])    Echo, April, 2013, before mitral valve surgery  .  S/P Maze operation for atrial fibrillation 01/26/2012   Complete biatrial lesion set using cryothermy via right mini thoracotomy  . S/P mitral valve replacement 01/26/2012   83mm Sorin Carbomedics Optiform mechanical prosthesis via right mini thoracotomy  . Warfarin anticoagulation    Mechanical mitral prosthesis, April, 20136    MEDICATIONS: Current Outpatient Medications on File Prior to Visit  Medication Sig Dispense Refill  . amoxicillin (AMOXIL) 500 MG  capsule take 4 capsules by mouth 1 hour prior to dental appointment  0  . Biotin 5000 MCG CAPS Take 2,000 mcg by mouth.    . Black Cohosh 40 MG CAPS Take 1 capsule by mouth daily.    . Bromelains (BROMELAIN PO) Take 1 capsule by mouth 2 (two) times daily.    . calcium carbonate (OSCAL) 1500 (600 Ca) MG TABS tablet Takes 1 time daily    . Cholecalciferol (VITAMIN D3) 2000 units capsule Take 2,000 Units by mouth daily.    . Coenzyme Q10 (CO Q-10) 100 MG CAPS Take 1 capsule by mouth daily.     . Cranberry 500 MG CAPS Take 1 capsule by mouth daily.    Marland Kitchen L-THEANINE PO Take 1 capsule by mouth daily.     Marland Kitchen lactose free nutrition (BOOST PLUS) LIQD Take 237 mLs by mouth daily.     Marland Kitchen lidocaine-prilocaine (EMLA) cream Apply 1 application topically as needed (per pt this is for use before blood draws).   0  . lisinopril (PRINIVIL,ZESTRIL) 5 MG tablet Take 1 tablet (5 mg total) by mouth daily. 90 tablet 3  . Lysine 500 MG CAPS Take 1 capsule by mouth daily.    . magnesium oxide (MAG-OX) 400 MG tablet Takes 300 mg daily    . metoprolol tartrate (LOPRESSOR) 25 MG tablet Take 1 tablet (25 mg total) by mouth 2 (two) times daily. 180 tablet 2  . MILK THISTLE PO Take 1 capsule by mouth 2 (two) times daily.    . Misc Natural Products (GLUCOSAMINE CHOND COMPLEX/MSM PO) Strength of dose Glucosamine 1500mg  /Chodroitron 1000mg / MSM 500mg  takes one twice daily    . Olive Leaf 500 MG CAPS Take 1 capsule by mouth 2 (two) times daily.     Marland Kitchen OVER THE COUNTER MEDICATION Red marine algae - 1 capsules daily    . OVER THE COUNTER MEDICATION Take 2 capsules by mouth daily. Mushroom Extract    . Petasin (PETADOLEX PO) Take by mouth 2 (two) times daily.    . Probiotic Product (PROBIOTIC DAILY PO) Take 1 capsule once a day    . PROGESTERONE MICRONIZED PO Take 75 mg by mouth daily.     . Pumpkin Seed (URIPLEX) 500 MG TABS Take 1 tablet by mouth 2 (two) times daily.    Marland Kitchen thyroid (ARMOUR) 90 MG tablet Take 90 mg by mouth daily.      . TURMERIC PO Take by mouth.    . vitamin A 10000 UNIT capsule Take 10,000 Units by mouth daily.    . vitamin E 400 UNIT capsule Take 400 Units by mouth daily.    Marland Kitchen warfarin (COUMADIN) 5 MG tablet Take as directed by Coumadin clinic 90 tablet 0   No current facility-administered medications on file prior to visit.     ALLERGIES: Allergies  Allergen Reactions  . Erythromycin Nausea And Vomiting    FAMILY HISTORY: Family History  Problem Relation Age of Onset  . Heart disease Father        cardiac arrest   .  CAD Father   . Hypertension Father   . CAD Mother        5 stents and numerous bypass surgery  . Hypertension Mother   . Hyperlipidemia Mother   . CAD Unknown   . Breast cancer Maternal Grandmother   . Breast cancer Maternal Aunt     SOCIAL HISTORY: Social History   Socioeconomic History  . Marital status: Widowed    Spouse name: Not on file  . Number of children: 2  . Years of education: 81  . Highest education level: Not on file  Social Needs  . Financial resource strain: Not on file  . Food insecurity - worry: Not on file  . Food insecurity - inability: Not on file  . Transportation needs - medical: Not on file  . Transportation needs - non-medical: Not on file  Occupational History  . Occupation: Retired  Tobacco Use  . Smoking status: Never Smoker  . Smokeless tobacco: Never Used  Substance and Sexual Activity  . Alcohol use: No  . Drug use: No  . Sexual activity: No    Birth control/protection: None  Other Topics Concern  . Not on file  Social History Narrative   Patient is a widower (since 34), he currently lives with her son and daughter-in-law. She has 2 children.   She is college educated, and retired from the [employer].   She uses herbal remedies, and multiple over-the-counter supplements. She takes a daily vitamin.   She wears her seatbelt, exercises routinely, smoke detector in the home.   Requires a walker or wheelchair at times.    Feels safe in her relationships.    REVIEW OF SYSTEMS: Constitutional: No fevers, chills, or sweats, no generalized fatigue, change in appetite Eyes: No visual changes, double vision, eye pain Ear, nose and throat: No hearing loss, ear pain, nasal congestion, sore throat Cardiovascular: No chest pain, palpitations Respiratory:  No shortness of breath at rest or with exertion, wheezes GastrointestinaI: No nausea, vomiting, diarrhea, abdominal pain, fecal incontinence Genitourinary:  No dysuria, urinary retention or frequency Musculoskeletal:  No neck pain, back pain Integumentary: No rash, pruritus, skin lesions Neurological: as above Psychiatric: No depression, insomnia, anxiety Endocrine: No palpitations, fatigue, diaphoresis, mood swings, change in appetite, change in weight, increased thirst Hematologic/Lymphatic:  No purpura, petechiae. Allergic/Immunologic: no itchy/runny eyes, nasal congestion, recent allergic reactions, rashes  PHYSICAL EXAM: Vitals:   12/23/17 1403  BP: 122/64  Pulse: 78  Resp: 16  SpO2: 98%   General: No acute distress.  Patient appears well-groomed.   Head:  Normocephalic/atraumatic Eyes:  Fundi examined but not visualized Neck: supple, no paraspinal tenderness, full range of motion Heart:  Regular rate and rhythm Lungs:  Clear to auscultation bilaterally Back: No paraspinal tenderness Neurological Exam: alert and oriented to person, place, and time. Attention span and concentration intact, recent and remote memory intact, fund of knowledge intact.  Speech fluent and not dysarthric, language intact.  CN II-XII intact. Decreased bulk in lower extremities, 4+/5 bilateral hip flexion, 5-/5 knee extension/flexion, otherwise muscle strength 5/5 throughout.  Sensation to light touch intact.  Deep tendon reflexes 2+ throughout, bilateral Babinski.  Finger to nose testing intact.  Wide-based gait with torso side bent to right.  IMPRESSION: Multiple  sclerosis. She wishes to defer DMT still at this time.  PLAN: 1.  She will continue D3 6000 IU daily.  Level last year was 68. 2.  I provided two DMTs to consider:  Tecfidera and Aubagio.  She will research them. 3.  Follow up in one year.  25 minutes spent face to face with patient, over 50% spent discussing management and diagnosis.  Metta Clines, DO  CC:  Howard Pouch, DO

## 2018-01-10 DIAGNOSIS — E039 Hypothyroidism, unspecified: Secondary | ICD-10-CM | POA: Diagnosis not present

## 2018-01-10 DIAGNOSIS — G35 Multiple sclerosis: Secondary | ICD-10-CM | POA: Diagnosis not present

## 2018-01-10 DIAGNOSIS — E559 Vitamin D deficiency, unspecified: Secondary | ICD-10-CM | POA: Diagnosis not present

## 2018-01-10 DIAGNOSIS — R7989 Other specified abnormal findings of blood chemistry: Secondary | ICD-10-CM | POA: Diagnosis not present

## 2018-01-11 ENCOUNTER — Ambulatory Visit (INDEPENDENT_AMBULATORY_CARE_PROVIDER_SITE_OTHER): Payer: Medicare Other | Admitting: *Deleted

## 2018-01-11 DIAGNOSIS — Z5181 Encounter for therapeutic drug level monitoring: Secondary | ICD-10-CM | POA: Diagnosis not present

## 2018-01-11 DIAGNOSIS — Z9889 Other specified postprocedural states: Secondary | ICD-10-CM | POA: Diagnosis not present

## 2018-01-11 DIAGNOSIS — Z952 Presence of prosthetic heart valve: Secondary | ICD-10-CM | POA: Diagnosis not present

## 2018-01-11 DIAGNOSIS — I480 Paroxysmal atrial fibrillation: Secondary | ICD-10-CM

## 2018-01-11 DIAGNOSIS — Z8679 Personal history of other diseases of the circulatory system: Secondary | ICD-10-CM

## 2018-01-11 DIAGNOSIS — I509 Heart failure, unspecified: Secondary | ICD-10-CM

## 2018-01-11 LAB — POCT INR: INR: 3.8

## 2018-01-11 NOTE — Patient Instructions (Signed)
Description   Do not take coumadin today March 26th then  continue taking 1 tablet everyday except 1/2 tablet on Sundays.  Recheck in 3 weeks. Call with any new medications or procedures [phone]

## 2018-01-12 LAB — HEPATIC FUNCTION PANEL
ALT: 12 (ref 7–35)
AST: 17 (ref 13–35)
Alkaline Phosphatase: 81 (ref 25–125)
BILIRUBIN, TOTAL: 0.4

## 2018-01-12 LAB — LIPID PANEL
Cholesterol: 211 — AB (ref 0–200)
HDL: 68 (ref 35–70)
LDL CALC: 127
LDl/HDL Ratio: 1.9
TRIGLYCERIDES: 68 (ref 40–160)

## 2018-01-12 LAB — CBC AND DIFFERENTIAL
HCT: 41 (ref 36–46)
Hemoglobin: 13.6 (ref 12.0–16.0)
Platelets: 256 (ref 150–399)

## 2018-01-12 LAB — BASIC METABOLIC PANEL
BUN: 23 — AB (ref 4–21)
CREATININE: 0.8 (ref 0.5–1.1)
Glucose: 106
Potassium: 3.7 (ref 3.4–5.3)
Sodium: 139 (ref 137–147)

## 2018-01-12 LAB — HEMOGLOBIN A1C: Hemoglobin A1C: 5.2

## 2018-01-12 LAB — TSH: TSH: 3.05 (ref 0.41–5.90)

## 2018-01-17 ENCOUNTER — Encounter: Payer: Self-pay | Admitting: Family Medicine

## 2018-01-17 DIAGNOSIS — G35 Multiple sclerosis: Secondary | ICD-10-CM | POA: Diagnosis not present

## 2018-02-01 ENCOUNTER — Ambulatory Visit (INDEPENDENT_AMBULATORY_CARE_PROVIDER_SITE_OTHER): Payer: Medicare Other | Admitting: *Deleted

## 2018-02-01 DIAGNOSIS — I509 Heart failure, unspecified: Secondary | ICD-10-CM

## 2018-02-01 DIAGNOSIS — Z5181 Encounter for therapeutic drug level monitoring: Secondary | ICD-10-CM | POA: Diagnosis not present

## 2018-02-01 DIAGNOSIS — I480 Paroxysmal atrial fibrillation: Secondary | ICD-10-CM

## 2018-02-01 DIAGNOSIS — Z9889 Other specified postprocedural states: Secondary | ICD-10-CM | POA: Diagnosis not present

## 2018-02-01 DIAGNOSIS — Z952 Presence of prosthetic heart valve: Secondary | ICD-10-CM | POA: Diagnosis not present

## 2018-02-01 DIAGNOSIS — Z8679 Personal history of other diseases of the circulatory system: Secondary | ICD-10-CM | POA: Diagnosis not present

## 2018-02-01 LAB — POCT INR: INR: 2

## 2018-02-01 NOTE — Patient Instructions (Signed)
Description   Today April 16th take 1 and 1/2 tablets (7.5mg ) then   continue taking 1 tablet everyday except 1/2 tablet on Sundays.  Recheck in 2 weeks. Call with any new medications or procedures [phone]

## 2018-02-15 ENCOUNTER — Ambulatory Visit (INDEPENDENT_AMBULATORY_CARE_PROVIDER_SITE_OTHER): Payer: Medicare Other | Admitting: *Deleted

## 2018-02-15 DIAGNOSIS — Z9889 Other specified postprocedural states: Secondary | ICD-10-CM

## 2018-02-15 DIAGNOSIS — Z5181 Encounter for therapeutic drug level monitoring: Secondary | ICD-10-CM

## 2018-02-15 DIAGNOSIS — Z952 Presence of prosthetic heart valve: Secondary | ICD-10-CM

## 2018-02-15 DIAGNOSIS — Z8679 Personal history of other diseases of the circulatory system: Secondary | ICD-10-CM | POA: Diagnosis not present

## 2018-02-15 DIAGNOSIS — I509 Heart failure, unspecified: Secondary | ICD-10-CM

## 2018-02-15 LAB — POCT INR: INR: 2.8

## 2018-02-15 NOTE — Patient Instructions (Signed)
Description   Continue taking 1 tablet everyday except 1/2 tablet on Sundays.  Recheck in 3 weeks. Call with any new medications or procedures [phone]

## 2018-03-08 ENCOUNTER — Ambulatory Visit (INDEPENDENT_AMBULATORY_CARE_PROVIDER_SITE_OTHER): Payer: Medicare Other | Admitting: *Deleted

## 2018-03-08 DIAGNOSIS — Z9889 Other specified postprocedural states: Secondary | ICD-10-CM | POA: Diagnosis not present

## 2018-03-08 DIAGNOSIS — Z952 Presence of prosthetic heart valve: Secondary | ICD-10-CM | POA: Diagnosis not present

## 2018-03-08 DIAGNOSIS — Z5181 Encounter for therapeutic drug level monitoring: Secondary | ICD-10-CM

## 2018-03-08 DIAGNOSIS — Z8679 Personal history of other diseases of the circulatory system: Secondary | ICD-10-CM | POA: Diagnosis not present

## 2018-03-08 DIAGNOSIS — Z1231 Encounter for screening mammogram for malignant neoplasm of breast: Secondary | ICD-10-CM | POA: Diagnosis not present

## 2018-03-08 DIAGNOSIS — I509 Heart failure, unspecified: Secondary | ICD-10-CM

## 2018-03-08 DIAGNOSIS — Z853 Personal history of malignant neoplasm of breast: Secondary | ICD-10-CM | POA: Diagnosis not present

## 2018-03-08 LAB — POCT INR: INR: 3.7 — AB (ref 2.0–3.0)

## 2018-03-08 LAB — HM MAMMOGRAPHY

## 2018-03-08 NOTE — Patient Instructions (Signed)
Description   Skip today's dose, then Continue taking 1 tablet everyday except 1/2 tablet on Sundays.  Recheck in 3 weeks. Call with any new medications or procedures [phone]

## 2018-03-09 ENCOUNTER — Encounter: Payer: Self-pay | Admitting: *Deleted

## 2018-03-15 ENCOUNTER — Ambulatory Visit (INDEPENDENT_AMBULATORY_CARE_PROVIDER_SITE_OTHER): Payer: Medicare Other

## 2018-03-15 ENCOUNTER — Other Ambulatory Visit: Payer: Self-pay

## 2018-03-15 VITALS — BP 112/58 | HR 77 | Ht 69.0 in | Wt 135.5 lb

## 2018-03-15 DIAGNOSIS — Z0000 Encounter for general adult medical examination without abnormal findings: Secondary | ICD-10-CM | POA: Diagnosis not present

## 2018-03-15 NOTE — Progress Notes (Addendum)
Subjective:   [name] [name] is a 67 y.o. female who presents for Medicare Annual (Subsequent) preventive examination.  Review of Systems:  No ROS.  Medicare Wellness Visit. Additional risk factors are reflected in the social history.  Cardiac Risk Factors include: family history of premature cardiovascular disease;advanced age (>40men, >46 women)   Sleep patterns: Sleeps well.  Home Safety/Smoke Alarms: Feels safe in home. Smoke alarms in place.  Living environment; residence and Firearm Safety: Lives alone with cat in 3 story home, uses rails. Seat Belt Safety/Bike Helmet: Wears seat belt.   Female:   Pap-Last 2014 , followed by Dr. Dellis Filbert           Mammo-03/08/2018, normal.        Dexa scan-Last 2015, patient reports osteopenia. Plans to schedule this year. Takes homeopathic remedy.     CCS-colonoscopy 02/22/2009, normal. Recall 10 years.      Objective:     Vitals: BP (!) 112/58 (BP Location: Left Arm, Patient Position: Sitting, Cuff Size: Normal)   Pulse 77   Ht 5\' 9"  (1.753 m)   Wt 135 lb 8 oz (61.5 kg)   SpO2 98%   BMI 20.01 kg/m   Body mass index is 20.01 kg/m.  Advanced Directives 03/15/2018 12/04/2016 09/18/2016 09/01/2016 07/31/2016 07/12/2016 01/31/2016  Does Patient Have a Medical Advance Directive? Yes Yes Yes No No No Yes  Type of Paramedic of [place];Living will [place];Living will [place];Living will - - - [place];Living will  Copy of [place] in Chart? No - copy requested - No - copy requested - - - -  Would patient like information on creating a medical advance directive? - - No - Patient declined - No - patient declined information No - patient declined information -  Pre-existing out of facility DNR order (yellow form or pink MOST form) - - - - - - -    Tobacco Social History   Tobacco Use  Smoking Status Never Smoker  Smokeless Tobacco  Never Used     Counseling given: Not Answered   Past Medical History:  Diagnosis Date  . Anxiety   . Arthritis    knees  . Breast cancer ([place]) 1999  . CHF (congestive heart failure) ([place])    Related to severe mitral regurgitation, April, 2013  . 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 Assess by pulmonary team in the hospital April, 2013  . Ejection fraction    EF 60%, echo, April, 2013, with severe MR before mitral valve replacement  . Herpes   . Hypothyroidism   . IBS (irritable bowel syndrome)   . Mitral valve regurgitation    Mitral valve replacement April, 2013, Mitral valve prolapse  . Multiple sclerosis ([place])   . Neurogenic bladder   . Osteoporosis   . Ovarian cyst   . Paroxysmal atrial fibrillation (HCC)    Rapid atrial fibrillation in-hospital, Rapid cardioversion,  before mitral valve surgery  . Pulmonary hypertension ([place])    Echo, April, 2013, before mitral valve surgery  . S/P Maze operation for atrial fibrillation 01/26/2012   Complete biatrial lesion set using cryothermy via right mini thoracotomy  . 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 Warfarin anticoagulation    Mechanical mitral prosthesis, April, 20136   Past Surgical History:  Procedure Laterality Date  . BREAST  LUMPECTOMY Right 1999   with sent.node, and axillary dissection (20)  . CHEST TUBE INSERTION  01/26/2012   Procedure: CHEST TUBE INSERTION;  Surgeon: Rexene Alberts, MD;  Location: [facility];  Service: Open Heart Surgery;  Laterality: Left;  . COLONOSCOPY  2010   "normal"  . CYSTOSCOPY  1992  . LAPAROSCOPIC OVARIAN CYSTECTOMY  1978   urethral stricture repair  . LEFT AND RIGHT HEART CATHETERIZATION WITH CORONARY ANGIOGRAM N/A 01/20/2012   Procedure: LEFT AND RIGHT HEART CATHETERIZATION WITH CORONARY ANGIOGRAM;  Surgeon: Burnell Blanks, MD;  Location: [facility] CATH LAB;   Service: Cardiovascular;  Laterality: N/A;  . LYMPHADENECTOMY    . MAZE  01/26/2012   Procedure: MAZE;  Surgeon: Rexene Alberts, MD;  Location: [facility];  Service: Open Heart Surgery;  Laterality: N/A;  . MITRAL VALVE REPLACEMENT  01/26/2012   Procedure: MINIMALLY INVASIVE MITRAL VALVE (MV) REPLACEMENT;  Surgeon: Rexene Alberts, MD;  Location: [facility];  Service: Open Heart Surgery;  Laterality: Right;  . TEE WITHOUT CARDIOVERSION  01/19/2012   Procedure: TRANSESOPHAGEAL ECHOCARDIOGRAM (TEE);  Surgeon: Peter M Martinique, MD;  Location: [facility] ENDOSCOPY;  Service: Cardiovascular;  Laterality: N/A;  . TONSILLECTOMY  1970  . [place]  . WRIST SURGERY Right 2012   Family History  Problem Relation Age of Onset  . Heart disease Father        cardiac arrest   . CAD Father   . Hypertension Father   . CAD Mother        5 stents and numerous bypass surgery  . Hypertension Mother   . Hyperlipidemia Mother   . CAD Unknown   . Breast cancer Maternal Grandmother   . Breast cancer Maternal Aunt    Social History   Socioeconomic History  . Marital status: Widowed    Spouse name: Not on file  . Number of children: 2  . Years of education: 59  . Highest education level: Not on file  Occupational History  . Occupation: Retired  Scientific laboratory technician  . Financial resource strain: Not on file  . Food insecurity:    Worry: Not on file    Inability: Not on file  . Transportation needs:    Medical: Not on file    Non-medical: Not on file  Tobacco Use  . Smoking status: Never Smoker  . Smokeless tobacco: Never Used  Substance and Sexual Activity  . Alcohol use: No  . Drug use: No  . Sexual activity: Never    Birth control/protection: None  Lifestyle  . Physical activity:    Days per week: Not on file    Minutes per session: Not on file  . Stress: Not on file  Relationships  . Social connections:    Talks on phone: Not on file    Gets together: Not on file    Attends religious service:  Not on file    Active member of club or organization: Not on file    Attends meetings of clubs or organizations: Not on file    Relationship status: Not on file  Other Topics Concern  . Not on file  Social History Narrative   Patient is a widower (since 73), he currently lives with her son and daughter-in-law. She has 2 children.   She is college educated, and retired from the [employer].   She uses herbal remedies, and multiple over-the-counter supplements. She takes a daily vitamin.   She wears her seatbelt,  exercises routinely, smoke detector in the home.   Requires a walker or wheelchair at times.   Feels safe in her relationships.    Outpatient Encounter Medications as of 03/15/2018  Medication Sig  . Biotin 5000 MCG CAPS Take 2,000 mcg by mouth.  . Black Cohosh 40 MG CAPS Take 1 capsule by mouth daily.  . Bromelains (BROMELAIN PO) Take 1 capsule by mouth 2 (two) times daily.  . calcium carbonate (OSCAL) 1500 (600 Ca) MG TABS tablet Takes 1 time daily  . Cholecalciferol (VITAMIN D3) 2000 units capsule Take 2,000 Units by mouth daily.  . Coenzyme Q10 (CO Q-10) 100 MG CAPS Take 1 capsule by mouth daily.   . Cranberry 500 MG CAPS Take 1 capsule by mouth daily.  Marland Kitchen L-THEANINE PO Take 1 capsule by mouth daily.   Marland Kitchen lactose free nutrition (BOOST PLUS) LIQD Take 237 mLs by mouth daily.   Marland Kitchen lisinopril (PRINIVIL,ZESTRIL) 5 MG tablet Take 1 tablet (5 mg total) by mouth daily.  Marland Kitchen Lysine 500 MG CAPS Take 1 capsule by mouth daily.  . magnesium oxide (MAG-OX) 400 MG tablet Takes 300 mg daily  . metoprolol tartrate (LOPRESSOR) 25 MG tablet Take 1 tablet (25 mg total) by mouth 2 (two) times daily.  Marland Kitchen MILK THISTLE PO Take 1 capsule by mouth 2 (two) times daily.  . Misc Natural Products (GLUCOSAMINE CHOND COMPLEX/MSM PO) Strength of dose Glucosamine 1500mg  /Chodroitron 1000mg / MSM 500mg  takes one twice daily  . NON FORMULARY 1 capsule 2 (two) times daily.  Jonna Coup Leaf 500 MG CAPS Take 1  capsule by mouth 2 (two) times daily.   Marland Kitchen OVER THE COUNTER MEDICATION Red marine algae - 1 capsules daily  . OVER THE COUNTER MEDICATION Take 2 capsules by mouth daily. Mushroom Extract  . Petasin (PETADOLEX PO) Take by mouth 2 (two) times daily.  . Probiotic Product (PROBIOTIC DAILY PO) Take 1 capsule once a day  . PROGESTERONE MICRONIZED PO Take 75 mg by mouth daily.   . Pumpkin Seed (URIPLEX) 500 MG TABS Take 1 tablet by mouth 2 (two) times daily.  Marland Kitchen thyroid (ARMOUR) 90 MG tablet Take 90 mg by mouth daily.  . TURMERIC PO Take by mouth.  . vitamin A 10000 UNIT capsule Take 10,000 Units by mouth daily.  . vitamin E 400 UNIT capsule Take 400 Units by mouth daily.  Marland Kitchen warfarin (COUMADIN) 5 MG tablet Take as directed by Coumadin clinic  . amoxicillin (AMOXIL) 500 MG capsule take 4 capsules by mouth 1 hour prior to dental appointment  . lidocaine-prilocaine (EMLA) cream Apply 1 application topically as needed (per pt this is for use before blood draws).    No facility-administered encounter medications on file as of 03/15/2018.     Activities of Daily Living In your present state of health, do you have any difficulty performing the following activities: 03/15/2018  Hearing? N  Vision? N  Difficulty concentrating or making decisions? N  Walking or climbing stairs? N  Dressing or bathing? N  Doing errands, shopping? N  Preparing Food and eating ? N  Using the Toilet? N  In the past six months, have you accidently leaked urine? Y  Comment Pull ups and pads  Do you have problems with loss of bowel control? N  Managing your Medications? N  Managing your Finances? N  Housekeeping or managing your Housekeeping? N  Some recent data might be hidden    Patient Care Team: Ma Hillock, DO as PCP -  General (Family Medicine) Elsie Stain, MD as Attending Physician (Pulmonary Disease) Regal, Tamala Fothergill, DPM as Consulting Physician (Podiatry) Dorothy Spark, MD as Consulting Physician  (Cardiology) Mingo Amber, MD (Alternative Medicine) Ditty, Kevan Ny, MD as Consulting Physician (Neurosurgery) Princess Bruins, MD as Consulting Physician (Obstetrics and Gynecology) Vinnie Level (Dentistry) Druscilla Brownie, MD as Consulting Physician (Dermatology)    Assessment:   This is a routine wellness examination for [facility].  Exercise Activities and Dietary recommendations Current Exercise Habits: The patient does not participate in regular exercise at present(Walks stores with shopping cart), Exercise limited by: orthopedic condition(s)   Diet (meal preparation, eat out, water intake, caffeinated beverages, dairy products, fruits and vegetables): Drinks water.  Breakfast: boost; protein bar (20 gms); chips; macadamia nuts Lunch: sandwich  Dinner: Chicken and [place].   Occasionally eats 2 meals/day. Does not eat red meat.   Goals    . Gain weight     Gain 15 pounds by increasing caloric intake.        Fall Risk Fall Risk  03/15/2018 06/24/2017 01/11/2017 09/18/2016 08/11/2016  Falls in the past year? No No Yes - Yes  Comment - - - - -  Number falls in past yr: - - 2 or more 2 or more 2 or more  Comment - - - - -  Injury with Fall? - - Yes Yes Yes  Risk Factor Category  - - - High Fall Risk High Fall Risk  Risk for fall due to : - - History of fall(s) History of fall(s);Impaired balance/gait History of fall(s);Impaired mobility  Follow up - - - Falls prevention discussed Falls prevention discussed;Education provided;Falls evaluation completed     Depression Screen PHQ 2/9 Scores 03/15/2018 09/18/2016 08/11/2016 03/09/2012  PHQ - 2 Score 0 0 0 1     Cognitive Function       Ad8 score reviewed for issues:  Issues making decisions: no  Less interest in hobbies / activities: no  Repeats questions, stories (family complaining): no  Trouble using ordinary gadgets (microwave, computer, phone): no  Forgets the month or year: no  Mismanaging  finances: no  Remembering appts: no  Daily problems with thinking and/or memory: no Ad8 score is=0     Immunization History  Administered Date(s) Administered  . Td 10/21/2003  . Tdap 02/17/2011   Screening Tests Health Maintenance  Topic Date Due  . DEXA SCAN  03/16/2019 (Originally 04/24/2014)  . PNA vac Low Risk Adult (1 of 2 - PCV13) 03/16/2019 (Originally 04/24/2014)  . INFLUENZA VACCINE  05/19/2018  . COLONOSCOPY  02/23/2019  . MAMMOGRAM  03/08/2020  . TETANUS/TDAP  02/16/2021  . Hepatitis C Screening  Completed   Declines Pneumonia vaccines, concerns of Mercury.  Declines Shingrix vaccine.      Plan:    Schedule dermatology appointment.   Bring a copy of your living will and/or healthcare power of attorney to your next office visit.  Continue doing brain stimulating activities (puzzles, reading, adult coloring books, staying active) to keep memory sharp.   I have personally reviewed and noted the following in the patient's chart:   . Medical and social history . Use of alcohol, tobacco or illicit drugs  . Current medications and supplements . Functional ability and status . Nutritional status . Physical activity . Advanced directives . List of other physicians . Hospitalizations, surgeries, and ER visits in previous 12 months . Vitals . Screenings to include cognitive, depression, and falls . Referrals and appointments  In  addition, I have reviewed and discussed with patient certain preventive protocols, quality metrics, and best practice recommendations. A written personalized care plan for preventive services as well as general preventive health recommendations were provided to patient.     Gerilyn Nestle, RN  03/15/2018  PCP Notes: -Pt interested in another round of PT to increase strength. Will discuss at next OV.  -F/U with PCP 03/25/18.   Medical screening examination/treatment/procedure(s) were performed by non-physician practitioner and as  supervising physician I was immediately available for consultation/collaboration.  I agree with above assessment and plan.  Electronically Signed by: Howard Pouch, DO [facility]

## 2018-03-15 NOTE — Patient Instructions (Addendum)
Schedule dermatology appointment.   Bring a copy of your living will and/or healthcare power of attorney to your next office visit.  Continue doing brain stimulating activities (puzzles, reading, adult coloring books, staying active) to keep memory sharp.   Health Maintenance, Female Adopting a healthy lifestyle and getting preventive care can go a long way to promote health and wellness. Talk with your health care provider about what schedule of regular examinations is right for you. This is a good chance for you to check in with your provider about disease prevention and staying healthy. In between checkups, there are plenty of things you can do on your own. Experts have done a lot of research about which lifestyle changes and preventive measures are most likely to keep you healthy. Ask your health care provider for more information. Weight and diet Eat a healthy diet  Be sure to include plenty of vegetables, fruits, low-fat dairy products, and lean protein.  Do not eat a lot of foods high in solid fats, added sugars, or salt.  Get regular exercise. This is one of the most important things you can do for your health. ? Most adults should exercise for at least 150 minutes each week. The exercise should increase your heart rate and make you sweat (moderate-intensity exercise). ? Most adults should also do strengthening exercises at least twice a week. This is in addition to the moderate-intensity exercise.  Maintain a healthy weight  Body mass index (BMI) is a measurement that can be used to identify possible weight problems. It estimates body fat based on height and weight. Your health care provider can help determine your BMI and help you achieve or maintain a healthy weight.  For females 49 years of age and older: ? A BMI below 18.5 is considered underweight. ? A BMI of 18.5 to 24.9 is normal. ? A BMI of 25 to 29.9 is considered overweight. ? A BMI of 30 and above is considered  obese.  Watch levels of cholesterol and blood lipids  You should start having your blood tested for lipids and cholesterol at 69 years of age, then have this test every 5 years.  You may need to have your cholesterol levels checked more often if: ? Your lipid or cholesterol levels are high. ? You are older than 69 years of age. ? You are at high risk for heart disease.  Cancer screening Lung Cancer  Lung cancer screening is recommended for adults 36-76 years old who are at high risk for lung cancer because of a history of smoking.  A yearly low-dose CT scan of the lungs is recommended for people who: ? Currently smoke. ? Have quit within the past 15 years. ? Have at least a 30-pack-year history of smoking. A pack year is smoking an average of one pack of cigarettes a day for 1 year.  Yearly screening should continue until it has been 15 years since you quit.  Yearly screening should stop if you develop a health problem that would prevent you from having lung cancer treatment.  Breast Cancer  Practice breast self-awareness. This means understanding how your breasts normally appear and feel.  It also means doing regular breast self-exams. Let your health care provider know about any changes, no matter how small.  If you are in your 20s or 30s, you should have a clinical breast exam (CBE) by a health care provider every 1-3 years as part of a regular health exam.  If you are 40 or  or older, have a CBE every year. Also consider having a breast X-ray (mammogram) every year.  If you have a family history of breast cancer, talk to your health care provider about genetic screening.  If you are at high risk for breast cancer, talk to your health care provider about having an MRI and a mammogram every year.  Breast cancer gene (BRCA) assessment is recommended for women who have family members with BRCA-related cancers. BRCA-related cancers  include: ? Breast. ? Ovarian. ? Tubal. ? Peritoneal cancers.  Results of the assessment will determine the need for genetic counseling and BRCA1 and BRCA2 testing.  Cervical Cancer Your health care provider may recommend that you be screened regularly for cancer of the pelvic organs (ovaries, uterus, and vagina). This screening involves a pelvic examination, including checking for microscopic changes to the surface of your cervix (Pap test). You may be encouraged to have this screening done every 3 years, beginning at age 21.  For women ages 30-65, health care providers may recommend pelvic exams and Pap testing every 3 years, or they may recommend the Pap and pelvic exam, combined with testing for human papilloma virus (HPV), every 5 years. Some types of HPV increase your risk of cervical cancer. Testing for HPV may also be done on women of any age with unclear Pap test results.  Other health care providers may not recommend any screening for nonpregnant women who are considered low risk for pelvic cancer and who do not have symptoms. Ask your health care provider if a screening pelvic exam is right for you.  If you have had past treatment for cervical cancer or a condition that could lead to cancer, you need Pap tests and screening for cancer for at least 20 years after your treatment. If Pap tests have been discontinued, your risk factors (such as having a new sexual partner) need to be reassessed to determine if screening should resume. Some women have medical problems that increase the chance of getting cervical cancer. In these cases, your health care provider may recommend more frequent screening and Pap tests.  Colorectal Cancer  This type of cancer can be detected and often prevented.  Routine colorectal cancer screening usually begins at 69 years of age and continues through 69 years of age.  Your health care provider may recommend screening at an earlier age if you have risk factors  for colon cancer.  Your health care provider may also recommend using home test kits to check for hidden blood in the stool.  A small camera at the end of a tube can be used to examine your colon directly (sigmoidoscopy or colonoscopy). This is done to check for the earliest forms of colorectal cancer.  Routine screening usually begins at age 50.  Direct examination of the colon should be repeated every 5-10 years through 69 years of age. However, you may need to be screened more often if early forms of precancerous polyps or small growths are found.  Skin Cancer  Check your skin from head to toe regularly.  Tell your health care provider about any new moles or changes in moles, especially if there is a change in a mole's shape or color.  Also tell your health care provider if you have a mole that is larger than the size of a pencil eraser.  Always use sunscreen. Apply sunscreen liberally and repeatedly throughout the day.  Protect yourself by wearing long sleeves, pants, a wide-brimmed hat, and sunglasses whenever you are   outside.  Heart disease, diabetes, and high blood pressure  High blood pressure causes heart disease and increases the risk of stroke. High blood pressure is more likely to develop in: ? People who have blood pressure in the high end of the normal range (130-139/85-89 mm Hg). ? People who are overweight or obese. ? People who are African American.  If you are 18-39 years of age, have your blood pressure checked every 3-5 years. If you are 40 years of age or older, have your blood pressure checked every year. You should have your blood pressure measured twice-once when you are at a hospital or clinic, and once when you are not at a hospital or clinic. Record the average of the two measurements. To check your blood pressure when you are not at a hospital or clinic, you can use: ? An automated blood pressure machine at a pharmacy. ? A home blood pressure monitor.  If  you are between 55 years and 79 years old, ask your health care provider if you should take aspirin to prevent strokes.  Have regular diabetes screenings. This involves taking a blood sample to check your fasting blood sugar level. ? If you are at a normal weight and have a low risk for diabetes, have this test once every three years after 69 years of age. ? If you are overweight and have a high risk for diabetes, consider being tested at a younger age or more often. Preventing infection Hepatitis B  If you have a higher risk for hepatitis B, you should be screened for this virus. You are considered at high risk for hepatitis B if: ? You were born in a country where hepatitis B is common. Ask your health care provider which countries are considered high risk. ? Your parents were born in a high-risk country, and you have not been immunized against hepatitis B (hepatitis B vaccine). ? You have HIV or AIDS. ? You use needles to inject street drugs. ? You live with someone who has hepatitis B. ? You have had sex with someone who has hepatitis B. ? You get hemodialysis treatment. ? You take certain medicines for conditions, including cancer, organ transplantation, and autoimmune conditions.  Hepatitis C  Blood testing is recommended for: ? Everyone born from 1945 through 1965. ? Anyone with known risk factors for hepatitis C.  Sexually transmitted infections (STIs)  You should be screened for sexually transmitted infections (STIs) including gonorrhea and chlamydia if: ? You are sexually active and are younger than 69 years of age. ? You are older than 69 years of age and your health care provider tells you that you are at risk for this type of infection. ? Your sexual activity has changed since you were last screened and you are at an increased risk for chlamydia or gonorrhea. Ask your health care provider if you are at risk.  If you do not have HIV, but are at risk, it may be recommended  that you take a prescription medicine daily to prevent HIV infection. This is called pre-exposure prophylaxis (PrEP). You are considered at risk if: ? You are sexually active and do not regularly use condoms or know the HIV status of your partner(s). ? You take drugs by injection. ? You are sexually active with a partner who has HIV.  Talk with your health care provider about whether you are at high risk of being infected with HIV. If you choose to begin PrEP, you should first be tested   for HIV. You should then be tested every 3 months for as long as you are taking PrEP. Pregnancy  If you are premenopausal and you may become pregnant, ask your health care provider about preconception counseling.  If you may become pregnant, take 400 to 800 micrograms (mcg) of folic acid every day.  If you want to prevent pregnancy, talk to your health care provider about birth control (contraception). Osteoporosis and menopause  Osteoporosis is a disease in which the bones lose minerals and strength with aging. This can result in serious bone fractures. Your risk for osteoporosis can be identified using a bone density scan.  If you are 65 years of age or older, or if you are at risk for osteoporosis and fractures, ask your health care provider if you should be screened.  Ask your health care provider whether you should take a calcium or vitamin D supplement to lower your risk for osteoporosis.  Menopause may have certain physical symptoms and risks.  Hormone replacement therapy may reduce some of these symptoms and risks. Talk to your health care provider about whether hormone replacement therapy is right for you. Follow these instructions at home:  Schedule regular health, dental, and eye exams.  Stay current with your immunizations.  Do not use any tobacco products including cigarettes, chewing tobacco, or electronic cigarettes.  If you are pregnant, do not drink alcohol.  If you are  breastfeeding, limit how much and how often you drink alcohol.  Limit alcohol intake to no more than 1 drink per day for nonpregnant women. One drink equals 12 ounces of beer, 5 ounces of wine, or 1 ounces of hard liquor.  Do not use street drugs.  Do not share needles.  Ask your health care provider for help if you need support or information about quitting drugs.  Tell your health care provider if you often feel depressed.  Tell your health care provider if you have ever been abused or do not feel safe at home. This information is not intended to replace advice given to you by your health care provider. Make sure you discuss any questions you have with your health care provider. Document Released: 04/20/2011 Document Revised: 03/12/2016 Document Reviewed: 07/09/2015 Elsevier Interactive Patient Education  2018 [employer] Inc[employer]  

## 2018-03-25 ENCOUNTER — Ambulatory Visit: Payer: Medicare Other | Admitting: Family Medicine

## 2018-03-29 ENCOUNTER — Ambulatory Visit (INDEPENDENT_AMBULATORY_CARE_PROVIDER_SITE_OTHER): Payer: Medicare Other | Admitting: *Deleted

## 2018-03-29 DIAGNOSIS — Z952 Presence of prosthetic heart valve: Secondary | ICD-10-CM

## 2018-03-29 DIAGNOSIS — Z8679 Personal history of other diseases of the circulatory system: Secondary | ICD-10-CM | POA: Diagnosis not present

## 2018-03-29 DIAGNOSIS — I480 Paroxysmal atrial fibrillation: Secondary | ICD-10-CM | POA: Diagnosis not present

## 2018-03-29 DIAGNOSIS — Z5181 Encounter for therapeutic drug level monitoring: Secondary | ICD-10-CM | POA: Diagnosis not present

## 2018-03-29 DIAGNOSIS — I509 Heart failure, unspecified: Secondary | ICD-10-CM

## 2018-03-29 DIAGNOSIS — Z9889 Other specified postprocedural states: Secondary | ICD-10-CM

## 2018-03-29 LAB — POCT INR: INR: 3 (ref 2.0–3.0)

## 2018-03-29 MED ORDER — WARFARIN SODIUM 5 MG PO TABS: ORAL_TABLET | ORAL | 0 refills | Status: DC

## 2018-03-29 NOTE — Patient Instructions (Signed)
Description    Continue taking 1 tablet everyday except 1/2 tablet on Sundays.  Recheck in 4 weeks. Call with any new medications or procedures [phone]      

## 2018-04-01 ENCOUNTER — Encounter: Payer: Self-pay | Admitting: Family Medicine

## 2018-04-01 ENCOUNTER — Ambulatory Visit (INDEPENDENT_AMBULATORY_CARE_PROVIDER_SITE_OTHER): Payer: Medicare Other | Admitting: Family Medicine

## 2018-04-01 VITALS — BP 119/55 | HR 77 | Temp 98.0°F | Resp 20 | Ht 69.0 in | Wt 135.2 lb

## 2018-04-01 DIAGNOSIS — Z8679 Personal history of other diseases of the circulatory system: Secondary | ICD-10-CM

## 2018-04-01 DIAGNOSIS — M4150 Other secondary scoliosis, site unspecified: Secondary | ICD-10-CM

## 2018-04-01 DIAGNOSIS — Z7901 Long term (current) use of anticoagulants: Secondary | ICD-10-CM

## 2018-04-01 DIAGNOSIS — Z9889 Other specified postprocedural states: Secondary | ICD-10-CM | POA: Diagnosis not present

## 2018-04-01 DIAGNOSIS — I509 Heart failure, unspecified: Secondary | ICD-10-CM

## 2018-04-01 DIAGNOSIS — G35D Multiple sclerosis, unspecified: Secondary | ICD-10-CM

## 2018-04-01 DIAGNOSIS — G35 Multiple sclerosis: Secondary | ICD-10-CM

## 2018-04-01 DIAGNOSIS — I440 Atrioventricular block, first degree: Secondary | ICD-10-CM | POA: Diagnosis not present

## 2018-04-01 DIAGNOSIS — E039 Hypothyroidism, unspecified: Secondary | ICD-10-CM | POA: Diagnosis not present

## 2018-04-01 DIAGNOSIS — I480 Paroxysmal atrial fibrillation: Secondary | ICD-10-CM | POA: Diagnosis not present

## 2018-04-01 NOTE — Progress Notes (Signed)
[name] [name] , 07/13/1951, 67 y.o., female MRN: [id number] Patient Care Team    Relationship Specialty Notifications Start End  Ma Hillock, DO PCP - General Family Medicine  07/31/16   Elsie Stain, MD Attending Physician Pulmonary Disease Abnormal results only, Admissions 03/03/12   Wallene Huh, DPM Consulting Physician Podiatry  08/11/16   Dorothy Spark, MD Consulting Physician Cardiology  08/11/16   Mingo Amber, MD  Alternative Medicine  08/11/16   Ditty, Kevan Ny, MD Consulting Physician Neurosurgery  08/11/16   Princess Bruins, MD Consulting Physician Obstetrics and Gynecology  09/18/16   Vinnie Level  Dentistry  09/18/16   Druscilla Brownie, MD Consulting Physician Dermatology  09/18/16    Chief Complaint  Patient presents with  . Follow-up    Subjective:  Hypothyroid:Armour Thyroid 90 mg daily. She is on multiple over-the-counter/integrated medicine medications including progesterones. She is managed by integrative med.   Multiple sclerosis: She has been performing home health physical therapy a few times a week and feels that she is starting to become stronger. She now has a trainer that comes to the home once a week (t[place]) and works with her. She is established with neurology with routine follow ups.   Atrial fibrillation: Patient is status post Maze procedure and mitral valve replacement  completed April 2013. She is established with cardiology, Dr. Meda Coffee, and Coumadin clinic for routine PT/INR on warfarin. Patient has a history of diastolic heart failure, pulmonary hypertension that occurred prior to mitral valve replacement. Last echocardiogram 11/09/2017 EF 45-50%. Diffuse hypokinesis.  No significant interval change. Ejection fraction 50-55%. Mechanical mitral valve functioning. Patient denies chest pain, shortness of breath, dizziness or lower extremity edema.    Allergies  Allergen Reactions  . Erythromycin Nausea And Vomiting    Social History   Tobacco Use  . Smoking status: Never Smoker  . Smokeless tobacco: Never Used  Substance Use Topics  . Alcohol use: No   Past Medical History:  Diagnosis Date  . Anxiety   . Arthritis    knees  . Breast cancer ([place]) 1999  . CHF (congestive heart failure) ([place])    Related to severe mitral regurgitation, April, 2013  . 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 Assess by pulmonary team in the hospital April, 2013  . Ejection fraction    EF 60%, echo, April, 2013, with severe MR before mitral valve replacement  . Herpes   . Hypothyroidism   . IBS (irritable bowel syndrome)   . Mitral valve regurgitation    Mitral valve replacement April, 2013, Mitral valve prolapse  . Multiple sclerosis ([place])   . Neurogenic bladder   . Osteoporosis   . Ovarian cyst   . Paroxysmal atrial fibrillation ([facility])    Rapid atrial fibrillation in-hospital, Rapid cardioversion,  before mitral valve surgery  . Pulmonary hypertension ([place])    Echo, April, 2013, before mitral valve surgery  . S/P Maze operation for atrial fibrillation 01/26/2012   Complete biatrial lesion set using cryothermy via right mini thoracotomy  . S/P mitral valve replacement 01/26/2012   26mm Sorin Carbomedics Optiform mechanical prosthesis via right mini thoracotomy  . Warfarin anticoagulation    Mechanical mitral prosthesis, April, 20136   Past Surgical History:  Procedure Laterality Date  . BREAST LUMPECTOMY Right 1999   with sent.node, and axillary dissection (20)  . CHEST TUBE INSERTION  01/26/2012   Procedure: CHEST TUBE INSERTION;  Surgeon: Rexene Alberts,  MD;  Location: [facility] OR;  Service: Open Heart Surgery;  Laterality: Left;  . COLONOSCOPY  2010   "normal"  . CYSTOSCOPY  1992  . LAPAROSCOPIC OVARIAN CYSTECTOMY  1978   urethral stricture repair  . LEFT AND RIGHT HEART CATHETERIZATION WITH CORONARY ANGIOGRAM N/A 01/20/2012   Procedure: LEFT AND RIGHT HEART CATHETERIZATION  WITH CORONARY ANGIOGRAM;  Surgeon: Burnell Blanks, MD;  Location: [facility] CATH LAB;  Service: Cardiovascular;  Laterality: N/A;  . LYMPHADENECTOMY    . MAZE  01/26/2012   Procedure: MAZE;  Surgeon: Rexene Alberts, MD;  Location: [place];  Service: Open Heart Surgery;  Laterality: N/A;  . MITRAL VALVE REPLACEMENT  01/26/2012   Procedure: MINIMALLY INVASIVE MITRAL VALVE (MV) REPLACEMENT;  Surgeon: Rexene Alberts, MD;  Location: [facility];  Service: Open Heart Surgery;  Laterality: Right;  . TEE WITHOUT CARDIOVERSION  01/19/2012   Procedure: TRANSESOPHAGEAL ECHOCARDIOGRAM (TEE);  Surgeon: Peter M Martinique, MD;  Location: [facility] ENDOSCOPY;  Service: Cardiovascular;  Laterality: N/A;  . TONSILLECTOMY  1970  . [place]  . WRIST SURGERY Right 2012   Family History  Problem Relation Age of Onset  . Heart disease Father        cardiac arrest   . CAD Father   . Hypertension Father   . CAD Mother        5 stents and numerous bypass surgery  . Hypertension Mother   . Hyperlipidemia Mother   . CAD Unknown   . Breast cancer Maternal Grandmother   . Breast cancer Maternal Aunt    Allergies as of 04/01/2018      Reactions   Erythromycin Nausea And Vomiting      Medication List        Accurate as of 04/01/18  2:25 PM. Always use your most recent med list.          amoxicillin 500 MG capsule Commonly known as:  AMOXIL take 4 capsules by mouth 1 hour prior to dental appointment   Biotin 5000 MCG Caps Take 20,000 mcg by mouth.   Black Cohosh 40 MG Caps Take 1 capsule by mouth daily.   BROMELAIN PO Take 1 capsule by mouth 2 (two) times daily.   calcium carbonate 1500 (600 Ca) MG Tabs tablet Commonly known as:  OSCAL Takes 1 time daily   Co Q-10 100 MG Caps Take 1 capsule by mouth daily.   Cranberry 500 MG Caps Take 1 capsule by mouth daily.   GLUCOSAMINE CHOND COMPLEX/MSM PO Strength of dose Glucosamine 1500mg  /Chodroitron 1000mg / MSM 500mg  takes one twice daily     L-THEANINE PO Take 1 capsule by mouth daily.   lactose free nutrition Liqd Take 237 mLs by mouth daily.   lidocaine-prilocaine cream Commonly known as:  EMLA Apply 1 application topically as needed (per pt this is for use before blood draws).   lisinopril 5 MG tablet Commonly known as:  PRINIVIL,ZESTRIL Take 1 tablet (5 mg total) by mouth daily.   Lysine 500 MG Caps Take 1 capsule by mouth daily.   Magnesium Citrate 200 MG Tabs Take 200 mg by mouth daily as needed.   metoprolol tartrate 25 MG tablet Commonly known as:  LOPRESSOR Take 1 tablet (25 mg total) by mouth 2 (two) times daily.   MILK THISTLE PO Take 1 capsule by mouth 2 (two) times daily.   NON FORMULARY 1 capsule 2 (two) times daily.   Olive Leaf 500 MG Caps Take 1  capsule by mouth 2 (two) times daily.   OVER THE COUNTER MEDICATION Take 2 capsules by mouth daily. Mushroom Extract   OVER THE COUNTER MEDICATION Red marine algae - 1 capsules daily   PETADOLEX PO Take by mouth 2 (two) times daily.   PROBIOTIC DAILY PO Take 1 capsule once a day   PROGESTERONE MICRONIZED PO Take 100 mg by mouth daily.   thyroid 90 MG tablet Commonly known as:  ARMOUR Take 90 mg by mouth daily.   TURMERIC PO Take by mouth.   URIPLEX 500 MG Tabs Generic drug:  Pumpkin Seed Take 1 tablet by mouth 2 (two) times daily.   vitamin A 10000 UNIT capsule Take 10,000 Units by mouth daily.   Vitamin D3 2000 units capsule Take 6,000 Units by mouth daily.   vitamin E 400 UNIT capsule Take 400 Units by mouth daily.   warfarin 5 MG tablet Commonly known as:  COUMADIN Take as directed by the anticoagulation clinic. If you are unsure how to take this medication, talk to your nurse or doctor. Original instructions:  Take as directed by Coumadin clinic       No results found for this or any previous visit (from the past 24 hour(s)). No results found.   ROS: Negative, with the exception of above mentioned in  HPI   Objective:  BP (!) 119/55 (BP Location: Left Arm, Patient Position: Sitting, Cuff Size: Normal)   Pulse 77   Temp 98 F (36.7 C)   Resp 20   Ht 5\' 9"  (1.753 m)   Wt 135 lb 4 oz (61.3 kg)   SpO2 95%   BMI 19.97 kg/m  Body mass index is 19.97 kg/m. Gen: Afebrile. No acute distress. Non-toxic in appearance, well developed. Well nourished. Walking well with a cane today.  HENT: AT. [place].  MMM.  Eyes:Pupils Equal Round Reactive to light, Extraocular movements intact,  Conjunctiva without redness, discharge or icterus. Neck/lymp/endocrine: Supple,no lymphadenopathy, no thyromegaly CV: RRR no murmur, no edema, +2/4 P posterior tibialis pulses Chest: CTAB, no wheeze or crackles Abd: Soft. NTND. BS present. no Masses palpated.  MSK: walking well with cane.  Skin: no rashes, purpura or petechiae.  Neuro: PERLA. EOMi. Alert. Oriented x3  Psych: Normal affect, dress and demeanor. Normal speech. Normal thought content and judgment.    Assessment/Plan: ISAAC LACSON is a 66 y.o. female present for OV for  Hypothyroidism, unspecified type - Patient encouraged follow-up with her integrated/holistic medicine doctor for her supplements and hormones.   Diastolic congestive heart failure, unspecified congestive heart failure chronicity (HCC) Paroxysmal atrial fibrillation (HCC) S/P mitral valve replacement S/P mitral valve replacement - all labs collected at other providers and UTD over the  few months--> reviewed.  - continue routine follow up with cardiology which supply her medications. EF mildly decreased since prior readings.   Multiple sclerosis ([place]) - continue routine followup with neurology. Working with trainer and strength ha improved a great deal.  - no recent falls.    electronically signed by:  Howard Pouch, DO  [place]

## 2018-04-01 NOTE — Patient Instructions (Signed)
It was nice to see you today. You look great! Continue follow ups with specialist. We do follow along with them.  If you decide you need additional PT, I will be happy to order for you, but would have needed to see you within 90 days of order.    Please help [place] help you:  We are honored you have chosen [facility] for your Primary Care home. Below you will find basic instructions that you may need to access in the future. Please help [place] help you by reading the instructions, which cover many of the frequent questions we experience.   Prescription refills and request:  -In order to allow more efficient response time, please call your pharmacy for all refills. They will forward the request electronically to [place]. This allows for the quickest possible response. Request left on a nurse line can take longer to refill, since these are checked as time allows between office patients and other phone calls.  - refill request can take up to 3-5 working days to complete.  - If request is sent electronically and request is appropiate, it is usually completed in 1-2 business days.  - all patients will need to be seen routinely for all chronic medical conditions requiring prescription medications (see follow-up below). If you are overdue for follow up on your condition, you will be asked to make an appointment and we will call in enough medication to cover you until your appointment (up to 30 days).  - all controlled substances will require a face to face visit to request/refill.  - if you desire your prescriptions to go through a new pharmacy, and have an active script at original pharmacy, you will need to call your pharmacy and have scripts transferred to new pharmacy. This is completed between the pharmacy locations and not by your provider.    Results: If any images or labs were ordered, it can take up to 1 week to get results depending on the test ordered and the lab/facility running and resulting the  test. - Normal or stable results, which do not need further discussion, may be released to your mychart immediately with attached note to you. A call may not be generated for normal results. Please make certain to sign up for mychart. If you have questions on how to activate your mychart you can call the front office.  - If your results need further discussion, our office will attempt to contact you via phone, and if unable to reach you after 2 attempts, we will release your abnormal result to your mychart with instructions.  - All results will be automatically released in mychart after 1 week.  - Your provider will provide you with explanation and instruction on all relevant material in your results. Please keep in mind, results and labs may appear confusing or abnormal to the untrained eye, but it does not mean they are actually abnormal for you personally. If you have any questions about your results that are not covered, or you desire more detailed explanation than what was provided, you should make an appointment with your provider to do so.   Our office handles many outgoing and incoming calls daily. If we have not contacted you within 1 week about your results, please check your mychart to see if there is a message first and if not, then contact our office.  In helping with this matter, you help decrease call volume, and therefore allow [place] to be able to respond to patients  needs more efficiently.   Acute office visits (sick visit):  An acute visit is intended for a new problem and are scheduled in shorter time slots to allow schedule openings for patients with new problems. This is the appropriate visit to discuss a new problem. Problems will not be addressed by phone call or Echart message. Appointment is needed if requesting treatment. In order to provide you with excellent quality medical care with proper time for you to explain your problem, have an exam and receive treatment with instructions,  these appointments should be limited to one new problem per visit. If you experience a new problem, in which you desire to be addressed, please make an acute office visit, we save openings on the schedule to accommodate you. Please do not save your new problem for any other type of visit, let us take care of it properly and quickly for you.   Follow up visits:  Depending on your condition(s) your provider will need to see you routinely in order to provide you with quality care and prescribe medication(s). Most chronic conditions (Example: hypertension, Diabetes, depression/anxiety... etc), require visits a couple times a year. Your provider will instruct you on proper follow up for your personal medical conditions and history. Please make certain to make follow up appointments for your condition as instructed. Failing to do so could result in lapse in your medication treatment/refills. If you request a refill, and are overdue to be seen on a condition, we will always provide you with a 30 day script (once) to allow you time to schedule.    Medicare wellness (well visit): - we have a wonderful Nurse Maudie Mercury), that will meet with you and provide you will yearly medicare wellness visits. These visits should occur yearly (can not be scheduled less than 1 calendar year apart) and cover preventive health, immunizations, advance directives and screenings you are entitled to yearly through your medicare benefits. Do not miss out on your entitled benefits, this is when medicare will pay for these benefits to be ordered for you.  These are strongly encouraged by your provider and is the appropriate type of visit to make certain you are up to date with all preventive health benefits. If you have not had your medicare wellness exam in the last 12 months, please make certain to schedule one by calling the office and schedule your medicare wellness with Maudie Mercury as soon as possible.   Yearly physical (well visit):  - Adults are  recommended to be seen yearly for physicals. Check with your insurance and date of your last physical, most insurances require one calendar year between physicals. Physicals include all preventive health topics, screenings, medical exam and labs that are appropriate for gender/age and history. You may have fasting labs needed at this visit. This is a well visit (not a sick visit), new problems should not be covered during this visit (see acute visit).  - Pediatric patients are seen more frequently when they are younger. Your provider will advise you on well child visit timing that is appropriate for your their age. - This is not a medicare wellness visit. Medicare wellness exams do not have an exam portion to the visit. Some medicare companies allow for a physical, some do not allow a yearly physical. If your medicare allows a yearly physical you can schedule the medicare wellness with our nurse Maudie Mercury and have your physical with your provider after, on the same day. Please check with insurance for your full benefits.  Late Policy/No Shows:  - all new patients should arrive 15-30 minutes earlier than appointment to allow [place] time  to  obtain all personal demographics,  insurance information and for you to complete office paperwork. - All established patients should arrive 10-15 minutes earlier than appointment time to update all information and be checked in .  - In our best efforts to run on time, if you are late for your appointment you will be asked to either reschedule or if able, we will work you back into the schedule. There will be a wait time to work you back in the schedule,  depending on availability.  - If you are unable to make it to your appointment as scheduled, please call 24 hours ahead of time to allow [place] to fill the time slot with someone else who needs to be seen. If you do not cancel your appointment ahead of time, you may be charged a no show fee.

## 2018-04-05 ENCOUNTER — Encounter: Payer: Self-pay | Admitting: Family Medicine

## 2018-04-26 ENCOUNTER — Ambulatory Visit (INDEPENDENT_AMBULATORY_CARE_PROVIDER_SITE_OTHER): Payer: Medicare Other | Admitting: *Deleted

## 2018-04-26 DIAGNOSIS — I480 Paroxysmal atrial fibrillation: Secondary | ICD-10-CM

## 2018-04-26 DIAGNOSIS — Z952 Presence of prosthetic heart valve: Secondary | ICD-10-CM | POA: Diagnosis not present

## 2018-04-26 DIAGNOSIS — I509 Heart failure, unspecified: Secondary | ICD-10-CM

## 2018-04-26 DIAGNOSIS — Z8679 Personal history of other diseases of the circulatory system: Secondary | ICD-10-CM

## 2018-04-26 DIAGNOSIS — Z5181 Encounter for therapeutic drug level monitoring: Secondary | ICD-10-CM

## 2018-04-26 DIAGNOSIS — Z9889 Other specified postprocedural states: Secondary | ICD-10-CM

## 2018-04-26 LAB — POCT INR: INR: 2.5 (ref 2.0–3.0)

## 2018-04-26 NOTE — Patient Instructions (Signed)
Description    Continue taking 1 tablet everyday except 1/2 tablet on Sundays.  Recheck in 4 weeks. Call with any new medications or procedures [phone]

## 2018-05-24 ENCOUNTER — Ambulatory Visit (INDEPENDENT_AMBULATORY_CARE_PROVIDER_SITE_OTHER): Payer: Medicare Other | Admitting: *Deleted

## 2018-05-24 DIAGNOSIS — I509 Heart failure, unspecified: Secondary | ICD-10-CM

## 2018-05-24 DIAGNOSIS — Z9889 Other specified postprocedural states: Secondary | ICD-10-CM

## 2018-05-24 DIAGNOSIS — Z5181 Encounter for therapeutic drug level monitoring: Secondary | ICD-10-CM | POA: Diagnosis not present

## 2018-05-24 DIAGNOSIS — Z8679 Personal history of other diseases of the circulatory system: Secondary | ICD-10-CM

## 2018-05-24 DIAGNOSIS — Z952 Presence of prosthetic heart valve: Secondary | ICD-10-CM

## 2018-05-24 LAB — POCT INR: INR: 2.2 (ref 2.0–3.0)

## 2018-05-24 NOTE — Patient Instructions (Signed)
Description   Today take 1.5 tablets then continue taking 1 tablet everyday except 1/2 tablet on Sundays.  Recheck in 3 weeks. Call with any new medications or procedures [phone]

## 2018-06-14 ENCOUNTER — Ambulatory Visit (INDEPENDENT_AMBULATORY_CARE_PROVIDER_SITE_OTHER): Payer: Medicare Other | Admitting: *Deleted

## 2018-06-14 DIAGNOSIS — Z8679 Personal history of other diseases of the circulatory system: Secondary | ICD-10-CM | POA: Diagnosis not present

## 2018-06-14 DIAGNOSIS — Z9889 Other specified postprocedural states: Secondary | ICD-10-CM

## 2018-06-14 DIAGNOSIS — Z952 Presence of prosthetic heart valve: Secondary | ICD-10-CM | POA: Diagnosis not present

## 2018-06-14 DIAGNOSIS — I509 Heart failure, unspecified: Secondary | ICD-10-CM

## 2018-06-14 DIAGNOSIS — Z5181 Encounter for therapeutic drug level monitoring: Secondary | ICD-10-CM

## 2018-06-14 LAB — POCT INR: INR: 2.3 (ref 2.0–3.0)

## 2018-06-14 NOTE — Patient Instructions (Signed)
Description   Today take 1.5 tablets then change your dose to  1 tablet everyday.  Recheck in 2 weeks. Call with any new medications or procedures [phone]

## 2018-06-23 ENCOUNTER — Other Ambulatory Visit: Payer: Self-pay | Admitting: Cardiology

## 2018-06-28 ENCOUNTER — Ambulatory Visit (INDEPENDENT_AMBULATORY_CARE_PROVIDER_SITE_OTHER): Payer: Medicare Other

## 2018-06-28 DIAGNOSIS — Z5181 Encounter for therapeutic drug level monitoring: Secondary | ICD-10-CM

## 2018-06-28 DIAGNOSIS — Z8679 Personal history of other diseases of the circulatory system: Secondary | ICD-10-CM | POA: Diagnosis not present

## 2018-06-28 DIAGNOSIS — I509 Heart failure, unspecified: Secondary | ICD-10-CM

## 2018-06-28 DIAGNOSIS — Z952 Presence of prosthetic heart valve: Secondary | ICD-10-CM

## 2018-06-28 DIAGNOSIS — Z9889 Other specified postprocedural states: Secondary | ICD-10-CM | POA: Diagnosis not present

## 2018-06-28 LAB — POCT INR: INR: 4 — AB (ref 2.0–3.0)

## 2018-06-28 NOTE — Patient Instructions (Signed)
Please skip coumadin today, then continue your dose to 1 tablet everyday.  Recheck in 2 weeks. Call with any new medications or procedures [phone]

## 2018-07-12 ENCOUNTER — Ambulatory Visit (INDEPENDENT_AMBULATORY_CARE_PROVIDER_SITE_OTHER): Payer: Medicare Other

## 2018-07-12 DIAGNOSIS — Z5181 Encounter for therapeutic drug level monitoring: Secondary | ICD-10-CM | POA: Diagnosis not present

## 2018-07-12 DIAGNOSIS — I509 Heart failure, unspecified: Secondary | ICD-10-CM | POA: Diagnosis not present

## 2018-07-12 DIAGNOSIS — Z9889 Other specified postprocedural states: Secondary | ICD-10-CM | POA: Diagnosis not present

## 2018-07-12 DIAGNOSIS — Z8679 Personal history of other diseases of the circulatory system: Secondary | ICD-10-CM | POA: Diagnosis not present

## 2018-07-12 DIAGNOSIS — Z952 Presence of prosthetic heart valve: Secondary | ICD-10-CM

## 2018-07-12 LAB — POCT INR: INR: 4.7 — AB (ref 2.0–3.0)

## 2018-07-12 NOTE — Patient Instructions (Signed)
Please skip coumadin today, take 1/2 tablet tomorrow, then START NEW DOSAGE of to 1 tablet everyday EXCEPT 1/2 TABLET ON SUNDAYS.  Recheck in 2 weeks. Call with any new medications or procedures [phone]

## 2018-07-19 ENCOUNTER — Encounter: Payer: Self-pay | Admitting: Physician Assistant

## 2018-07-19 ENCOUNTER — Telehealth: Payer: Self-pay | Admitting: Cardiology

## 2018-07-19 NOTE — Telephone Encounter (Signed)
Please schedule her to see an extender in the clinic, thank you

## 2018-07-19 NOTE — Telephone Encounter (Signed)
New Message:     Patient has questions about her sleeping patterns   Sleeping on the left side there is a pinch and pain in her chest

## 2018-07-19 NOTE — Telephone Encounter (Signed)
Pt is calling to inform Dr Meda Coffee that she has had 2 episodes of pinching pain on her left side/chest area, when she lays on her left side at bedtime. Pt states that when she lays on her right side, there is no pain.  Pt states the 2 events noted was one night in late August, and 2 nights ago.   Pt states that the event in August occurred while she was sleeping her bed, she started out laying on her left side (nothing occurred), then rolled over to her right side (nothing occurred), then later on that night she ended up on her left side and was wakened by a sharp pinching pain on her left side/chest area.  Pt states she had no sob, N/V, diaphoresis, dizziness, or any other cardiac related symptoms when this occurred.  Pt states she just had the sharp "pinching pain," which lasted for about 25 mins, and then completely resolved.   Pt states that the episode that occurred 2 nights ago, was the same scenario, but was relieved within 10 mins. Pt states that propping herself in the bed helps relieve these issues.   Pt wants Dr Meda Coffee to advise if this is cardiac related, or anything to worry about.  Pt states she is asymptomatic otherwise, and feels completely normal during the day. Pt states she does not feel any palpitations or elevated HR during these episodes.  Informed the pt that Dr Meda Coffee is out of the office today, but I will route this message to her for further review and recommendation, and I will follow-up with her shortly thereafter. Pt verbalized understanding and agrees with this plan.

## 2018-07-19 NOTE — Telephone Encounter (Signed)
Spoke with the pt and informed her that per Dr Meda Coffee, she would like for her to be seen by an Extender on her team.  Scheduled the pt an appt with Estella Husk PA-C for 10/2 at 2 pm.  Advised the pt to arrive 15 mins prior to this appt.  Informed the pt that I will keep her appt with [place] there for 11/11, and Sharyn Lull can cancel this as needed, based on her assessment with the pt tomorrow. Pt verbalized understanding and agrees with this plan.

## 2018-07-20 ENCOUNTER — Encounter: Payer: Self-pay | Admitting: Physician Assistant

## 2018-07-20 ENCOUNTER — Ambulatory Visit (INDEPENDENT_AMBULATORY_CARE_PROVIDER_SITE_OTHER): Payer: Medicare Other | Admitting: Physician Assistant

## 2018-07-20 VITALS — BP 92/66 | HR 81 | Ht 69.0 in | Wt 138.0 lb

## 2018-07-20 DIAGNOSIS — I480 Paroxysmal atrial fibrillation: Secondary | ICD-10-CM | POA: Diagnosis not present

## 2018-07-20 DIAGNOSIS — Z952 Presence of prosthetic heart valve: Secondary | ICD-10-CM

## 2018-07-20 DIAGNOSIS — R079 Chest pain, unspecified: Secondary | ICD-10-CM | POA: Diagnosis not present

## 2018-07-20 DIAGNOSIS — I428 Other cardiomyopathies: Secondary | ICD-10-CM | POA: Diagnosis not present

## 2018-07-20 DIAGNOSIS — Z9889 Other specified postprocedural states: Secondary | ICD-10-CM | POA: Diagnosis not present

## 2018-07-20 DIAGNOSIS — Z8679 Personal history of other diseases of the circulatory system: Secondary | ICD-10-CM | POA: Diagnosis not present

## 2018-07-20 MED ORDER — METOPROLOL TARTRATE 25 MG PO TABS: 25.0000 mg | ORAL_TABLET | Freq: Two times a day (BID) | ORAL | 2 refills | Status: DC

## 2018-07-20 NOTE — Patient Instructions (Addendum)
Medication Instructions:  Your physician recommends that you continue on your current medications as directed. Please refer to the Current Medication list given to you today.   Labwork: None ordered  Testing/Procedures: None ordered  Follow-Up: Keep appointment with Lyda Jester, PA on 08/29/18 at 3:00 PM  Any Other Special Instructions Will Be Listed Below (If Applicable).     If you need a refill on your cardiac medications before your next appointment, please call your pharmacy.

## 2018-07-20 NOTE — Progress Notes (Signed)
Cardiology Office Note    Date:  07/20/2018   ID:  [name] [name], DOB Jun 10, 1951, MRN [id number]  PCP:  Ma Hillock, DO  Cardiologist: No primary care provider on file. EPS: None  No chief complaint on file.   History of Present Illness:  [name] [name] is a 67 y.o. female with mechanical mitral valve and Maze procedure 2014 on chronic Coumadin. Marland Kitchen  History of multiple sclerosis and breast cancer.  2D echo 11/09/2017 LVEF mildly reduced at 45 to 50% with diffuse hypokinesis, mitral valve functioning normally.  Dr. Meda Coffee added lisinopril 5 mg once daily.  Complains of pinching in left chest worse with deep breath & resolves in 10 min-has had for a long time. In Aug. When rolling over from her right side to left side developed an intense sharp shooting pain left chest. Got up to the bathroom and then sat up & read. It eventually eased within 30-45 min. Last Saturday same thing happened when rolling over but pain not as intense & eased within 10-15 min. Also has had dizziness since her surgery when she turns quickly. No change and no dizziness with changing position from lying to sitting or standing.  Past Medical History:  Diagnosis Date  . Anxiety   . Arthritis    knees  . Breast cancer ([place]) 1999  . CHF (congestive heart failure) ([place])    Related to severe mitral regurgitation, April, 2013  . 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 Assess by pulmonary team in the hospital April, 2013  . Ejection fraction    EF 60%, echo, April, 2013, with severe MR before mitral valve replacement  . Herpes   . Hypothyroidism   . IBS (irritable bowel syndrome)   . Mitral valve regurgitation    Mitral valve replacement April, 2013, Mitral valve prolapse  . Multiple sclerosis ([facility])   . Neurogenic bladder   . Osteoporosis   . Ovarian cyst   . Paroxysmal atrial fibrillation ([facility])    Rapid atrial fibrillation in-hospital, Rapid cardioversion,  before  mitral valve surgery  . Pulmonary hypertension ([place])    Echo, April, 2013, before mitral valve surgery  . S/P Maze operation for atrial fibrillation 01/26/2012   Complete biatrial lesion set using cryothermy via right mini thoracotomy  . S/P mitral valve replacement 01/26/2012   60mm Sorin Carbomedics Optiform mechanical prosthesis via right mini thoracotomy  . Warfarin anticoagulation    Mechanical mitral prosthesis, April, 20136    Past Surgical History:  Procedure Laterality Date  . BREAST LUMPECTOMY Right 1999   with sent.node, and axillary dissection (20)  . CHEST TUBE INSERTION  01/26/2012   Procedure: CHEST TUBE INSERTION;  Surgeon: Rexene Alberts, MD;  Location: [facility];  Service: Open Heart Surgery;  Laterality: Left;  . COLONOSCOPY  2010   "normal"  . CYSTOSCOPY  1992  . LAPAROSCOPIC OVARIAN CYSTECTOMY  1978   urethral stricture repair  . LEFT AND RIGHT HEART CATHETERIZATION WITH CORONARY ANGIOGRAM N/A 01/20/2012   Procedure: LEFT AND RIGHT HEART CATHETERIZATION WITH CORONARY ANGIOGRAM;  Surgeon: Burnell Blanks, MD;  Location: [facility] CATH LAB;  Service: Cardiovascular;  Laterality: N/A;  . LYMPHADENECTOMY    . MAZE  01/26/2012   Procedure: MAZE;  Surgeon: Rexene Alberts, MD;  Location: [facility];  Service: Open Heart Surgery;  Laterality: N/A;  . MITRAL VALVE REPLACEMENT  01/26/2012   Procedure: MINIMALLY INVASIVE MITRAL VALVE (MV) REPLACEMENT;  Surgeon: Braulio Conte  Keturah Barre, MD;  Location: [facility];  Service: Open Heart Surgery;  Laterality: Right;  . TEE WITHOUT CARDIOVERSION  01/19/2012   Procedure: TRANSESOPHAGEAL ECHOCARDIOGRAM (TEE);  Surgeon: Peter M Martinique, MD;  Location[facility];  Service: Cardiovascular;  Laterality: N/A;  . TONSILLECTOMY  1970  . [place]  . WRIST SURGERY Right 2012    Current Medications: Current Meds  Medication Sig  . amoxicillin (AMOXIL) 500 MG capsule take 4 capsules by mouth 1 hour prior to dental appointment  . Biotin 5000 MCG  CAPS Take 20,000 mcg by mouth.   . Black Cohosh 40 MG CAPS Take 40 mg by mouth daily.  . Bromelains (BROMELAIN PO) Take 1 capsule by mouth 2 (two) times daily.  . calcium carbonate (OSCAL) 1500 (600 Ca) MG TABS tablet Takes 1 time daily  . Cholecalciferol (VITAMIN D3) 2000 units capsule Take 6,000 Units by mouth daily.   . Coenzyme Q10 (CO Q-10) 100 MG CAPS Take 1 capsule by mouth daily.   . Cranberry 500 MG CAPS Take 1 capsule by mouth daily.  Marland Kitchen L-THEANINE PO Take 1 capsule by mouth daily.   Marland Kitchen lactose free nutrition (BOOST PLUS) LIQD Take 237 mLs by mouth daily.   Marland Kitchen lidocaine-prilocaine (EMLA) cream Apply 1 application topically as needed (per pt this is for use before blood draws).   Marland Kitchen lisinopril (PRINIVIL,ZESTRIL) 5 MG tablet Take 1 tablet (5 mg total) by mouth daily.  Marland Kitchen Lysine 500 MG CAPS Take 1 capsule by mouth daily.  . Magnesium Citrate 200 MG TABS Take 200 mg by mouth daily as needed.  . metoprolol tartrate (LOPRESSOR) 25 MG tablet Take 1 tablet (25 mg total) by mouth 2 (two) times daily.  Marland Kitchen MILK THISTLE PO Take 1 capsule by mouth 2 (two) times daily.  . Misc Natural Products (GLUCOSAMINE CHOND COMPLEX/MSM PO) Strength of dose Glucosamine 1500mg  /Chodroitron 1000mg / MSM 500mg  takes one twice daily  . NON FORMULARY 1 capsule 2 (two) times daily.  Jonna Coup Leaf 500 MG CAPS Take 1 capsule by mouth 2 (two) times daily.   Marland Kitchen OVER THE COUNTER MEDICATION Red marine algae - 1 capsules daily  . Petasin (PETADOLEX PO) Take by mouth 2 (two) times daily.  . Probiotic Product (PROBIOTIC DAILY PO) Take 1 capsule once a day  . PROGESTERONE MICRONIZED PO Take 100 mg by mouth daily.   . Pumpkin Seed (URIPLEX) 500 MG TABS Take 1 tablet by mouth 2 (two) times daily.  Marland Kitchen thyroid (ARMOUR) 90 MG tablet Take 90 mg by mouth daily.  . TURMERIC PO Take by mouth.  . vitamin A 10000 UNIT capsule Take 10,000 Units by mouth daily.  . vitamin E 400 UNIT capsule Take 400 Units by mouth daily.  Marland Kitchen warfarin (COUMADIN)  5 MG tablet TAKE 1 TABLET BY MOUTH AS DIRECTED BY COUMADIN CLINIC FOR 90 DAYS  . [DISCONTINUED] metoprolol tartrate (LOPRESSOR) 25 MG tablet Take 1 tablet (25 mg total) by mouth 2 (two) times daily.     Allergies:   Erythromycin   Social History   Socioeconomic History  . Marital status: Widowed    Spouse name: Not on file  . Number of children: 2  . Years of education: 46  . Highest education level: Not on file  Occupational History  . Occupation: Retired  Scientific laboratory technician  . Financial resource strain: Not on file  . Food insecurity:    Worry: Not on file    Inability: Not on  file  . Transportation needs:    Medical: Not on file    Non-medical: Not on file  Tobacco Use  . Smoking status: Never Smoker  . Smokeless tobacco: Never Used  Substance and Sexual Activity  . Alcohol use: No  . Drug use: No  . Sexual activity: Never    Birth control/protection: None  Lifestyle  . Physical activity:    Days per week: Not on file    Minutes per session: Not on file  . Stress: Not on file  Relationships  . Social connections:    Talks on phone: Not on file    Gets together: Not on file    Attends religious service: Not on file    Active member of club or organization: Not on file    Attends meetings of clubs or organizations: Not on file    Relationship status: Not on file  Other Topics Concern  . Not on file  Social History Narrative   Patient is a widower (since 57), he currently lives with her son and daughter-in-law. She has 2 children.   She is college educated, and retired from the [employer].   She uses herbal remedies, and multiple over-the-counter supplements. She takes a daily vitamin.   She wears her seatbelt, exercises routinely, smoke detector in the home.   Requires a walker or wheelchair at times.   Feels safe in her relationships.     Family History:  The patient's family history includes Breast cancer in her maternal aunt and maternal grandmother; CAD in  her father, mother, and unknown relative; Heart disease in her father; Hyperlipidemia in her mother; Hypertension in her father and mother.   ROS:   Please see the history of present illness.    Review of Systems  Cardiovascular: Positive for chest pain.  Musculoskeletal: Positive for back pain and joint pain.  Neurological: Positive for dizziness and loss of balance.   All other systems reviewed and are negative.   PHYSICAL EXAM:   VS:  BP 92/66   Pulse 81   Ht 5\' 9"  (1.753 m)   Wt 138 lb (62.6 kg)   BMI 20.38 kg/m   Physical Exam  GEN: Well nourished, well developed, in no acute distress  Neck: no JVD, carotid bruits, or masses Cardiac:RRR; crisp valvular click without significant murmur Respiratory:  clear to auscultation bilaterally, normal work of breathing GI: soft, nontender, nondistended, + BS Ext: without cyanosis, clubbing, or edema, Good distal pulses bilaterally Neuro:  Alert and Oriented x 3 Psych: euthymic mood, full affect  Wt Readings from Last 3 Encounters:  07/20/18 138 lb (62.6 kg)  04/01/18 135 lb 4 oz (61.3 kg)  03/15/18 135 lb 8 oz (61.5 kg)      Studies/Labs Reviewed:   EKG:  EKG is  ordered today.  The ekg ordered today demonstrates normal sinus rhythm, no acute change  Recent Labs: 01/12/2018: ALT 12; BUN 23; Creatinine 0.8; Hemoglobin 13.6; Platelets 256; Potassium 3.7; Sodium 139; TSH 3.05   Lipid Panel    Component Value Date/Time   CHOL 211 (A) 01/10/2018   TRIG 68 01/10/2018   HDL 68 01/10/2018   CHOLHDL 4 10/29/2016 1006   VLDL 31.2 10/29/2016 1006   LDLCALC 127 01/10/2018    Additional studies/ records that were reviewed today include:  2D echo 11/09/2017 Study Conclusions   - Left ventricle: The cavity size was normal. Wall thickness was   normal. Systolic function was mildly reduced. The estimated  ejection fraction was in the range of 45% to 50%. Diffuse   hypokinesis. The study is not technically sufficient to allow    evaluation of LV diastolic function. - Mitral valve: Mechanical mitral valve intact. - Left atrium: The atrium was mildly dilated. Volume/bsa, ES   (1-plane Simpson&'s, A4C): 35.9 ml/m^2.   Recommendations:  EF is mildly reduced when compared to prior study.      ASSESSMENT:    1. Chest pain, unspecified type   2. S/P mitral valve replacement   3. S/P Maze operation for atrial fibrillation   4. Paroxysmal atrial fibrillation (HCC)   5. Nonischemic cardiomyopathy (HCC)      PLAN:  In order of problems listed above:  Chest pain occurring twice when she turned over in bed described as sharp shooting and quite atypical.  Suspect this is musculoskeletal.  EKG without change.  She does have MS and significant scoliosis.  Suspect it is related to this.  Status post MVR and maze procedure 2014 on Coumadin  PAF on Coumadin  Nonischemic cardiomyopathy ejection fraction 45 to 50% on most recent echo 10/2017 started on lisinopril and tolerating well.    Medication Adjustments/Labs and Tests Ordered: Current medicines are reviewed at length with the patient today.  Concerns regarding medicines are outlined above.  Medication changes, Labs and Tests ordered today are listed in the Patient Instructions below. Patient Instructions  Medication Instructions:  Your physician recommends that you continue on your current medications as directed. Please refer to the Current Medication list given to you today.   Labwork: None ordered  Testing/Procedures: None ordered  Follow-Up: Keep appointment with Lyda Jester, PA on 08/29/18 at 3:00 PM  Any Other Special Instructions Will Be Listed Below (If Applicable).     If you need a refill on your cardiac medications before your next appointment, please call your pharmacy.      Sumner Boast, PA-C  07/20/2018 2:49 PM    [facility] [address], [place], [place]  [address] Phone: [phone];  Fax: [phone]

## 2018-07-26 ENCOUNTER — Ambulatory Visit (INDEPENDENT_AMBULATORY_CARE_PROVIDER_SITE_OTHER): Payer: Medicare Other | Admitting: *Deleted

## 2018-07-26 DIAGNOSIS — Z952 Presence of prosthetic heart valve: Secondary | ICD-10-CM | POA: Diagnosis not present

## 2018-07-26 DIAGNOSIS — Z5181 Encounter for therapeutic drug level monitoring: Secondary | ICD-10-CM

## 2018-07-26 DIAGNOSIS — I509 Heart failure, unspecified: Secondary | ICD-10-CM | POA: Diagnosis not present

## 2018-07-26 DIAGNOSIS — Z9889 Other specified postprocedural states: Secondary | ICD-10-CM

## 2018-07-26 DIAGNOSIS — Z8679 Personal history of other diseases of the circulatory system: Secondary | ICD-10-CM

## 2018-07-26 LAB — POCT INR: INR: 3.3 — AB (ref 2.0–3.0)

## 2018-07-26 NOTE — Patient Instructions (Signed)
Description   Continue taking 1 tablet everyday EXCEPT 1/2 TABLET ON SUNDAYS.  Recheck in 3 weeks. Call with any new medications or procedures [phone]

## 2018-08-02 DIAGNOSIS — G35 Multiple sclerosis: Secondary | ICD-10-CM | POA: Diagnosis not present

## 2018-08-02 DIAGNOSIS — N951 Menopausal and female climacteric states: Secondary | ICD-10-CM | POA: Diagnosis not present

## 2018-08-02 DIAGNOSIS — E559 Vitamin D deficiency, unspecified: Secondary | ICD-10-CM | POA: Diagnosis not present

## 2018-08-02 DIAGNOSIS — E039 Hypothyroidism, unspecified: Secondary | ICD-10-CM | POA: Diagnosis not present

## 2018-08-02 DIAGNOSIS — R7989 Other specified abnormal findings of blood chemistry: Secondary | ICD-10-CM | POA: Diagnosis not present

## 2018-08-02 LAB — CBC AND DIFFERENTIAL
HEMATOCRIT: 42 (ref 36–46)
Hemoglobin: 14.2 (ref 12.0–16.0)
PLATELETS: 282 (ref 150–399)
WBC: 7.3

## 2018-08-02 LAB — LIPID PANEL
CHOLESTEROL: 234 — AB (ref 0–200)
HDL: 72 — AB (ref 35–70)
LDL Cholesterol: 144
Triglycerides: 75 (ref 40–160)

## 2018-08-02 LAB — BASIC METABOLIC PANEL
BUN: 30 — AB (ref 4–21)
CREATININE: 0.8 (ref 0.5–1.1)
GLUCOSE: 104

## 2018-08-02 LAB — HEPATIC FUNCTION PANEL
ALT: 13 (ref 7–35)
AST: 17 (ref 13–35)
BILIRUBIN, TOTAL: 0.5

## 2018-08-02 LAB — TSH: TSH: 0.82 (ref 0.41–5.90)

## 2018-08-02 LAB — HEMOGLOBIN A1C: Hemoglobin A1C: 5.4

## 2018-08-02 LAB — VITAMIN D 25 HYDROXY (VIT D DEFICIENCY, FRACTURES): Vit D, 25-Hydroxy: 70

## 2018-08-16 ENCOUNTER — Ambulatory Visit (INDEPENDENT_AMBULATORY_CARE_PROVIDER_SITE_OTHER): Payer: Medicare Other | Admitting: *Deleted

## 2018-08-16 DIAGNOSIS — Z952 Presence of prosthetic heart valve: Secondary | ICD-10-CM

## 2018-08-16 DIAGNOSIS — Z9889 Other specified postprocedural states: Secondary | ICD-10-CM

## 2018-08-16 DIAGNOSIS — I509 Heart failure, unspecified: Secondary | ICD-10-CM

## 2018-08-16 DIAGNOSIS — Z5181 Encounter for therapeutic drug level monitoring: Secondary | ICD-10-CM

## 2018-08-16 DIAGNOSIS — Z8679 Personal history of other diseases of the circulatory system: Secondary | ICD-10-CM

## 2018-08-16 LAB — POCT INR: INR: 3.2 — AB (ref 2.0–3.0)

## 2018-08-16 NOTE — Patient Instructions (Signed)
Description   Continue taking 1 tablet everyday EXCEPT 1/2 TABLET ON SUNDAYS.  Recheck in 4 weeks. Call with any new medications or procedures [phone]

## 2018-08-25 ENCOUNTER — Encounter: Payer: Self-pay | Admitting: Family Medicine

## 2018-08-29 ENCOUNTER — Ambulatory Visit: Payer: Medicare Other | Admitting: Cardiology

## 2018-09-13 ENCOUNTER — Ambulatory Visit: Payer: Medicare Other | Admitting: Cardiology

## 2018-09-20 ENCOUNTER — Ambulatory Visit (INDEPENDENT_AMBULATORY_CARE_PROVIDER_SITE_OTHER): Payer: Medicare Other | Admitting: *Deleted

## 2018-09-20 DIAGNOSIS — Z5181 Encounter for therapeutic drug level monitoring: Secondary | ICD-10-CM | POA: Diagnosis not present

## 2018-09-20 DIAGNOSIS — Z8679 Personal history of other diseases of the circulatory system: Secondary | ICD-10-CM

## 2018-09-20 DIAGNOSIS — Z952 Presence of prosthetic heart valve: Secondary | ICD-10-CM

## 2018-09-20 DIAGNOSIS — Z9889 Other specified postprocedural states: Secondary | ICD-10-CM

## 2018-09-20 DIAGNOSIS — I509 Heart failure, unspecified: Secondary | ICD-10-CM

## 2018-09-20 LAB — POCT INR: INR: 2.9 (ref 2.0–3.0)

## 2018-09-20 NOTE — Patient Instructions (Signed)
Description   Continue taking 1 tablet everyday EXCEPT 1/2 TABLET ON SUNDAYS.  Recheck in 6 weeks per request. Call with any new medications or procedures [phone]

## 2018-09-27 ENCOUNTER — Ambulatory Visit: Payer: Medicare Other | Admitting: Sports Medicine

## 2018-10-04 ENCOUNTER — Ambulatory Visit: Payer: Medicare Other | Admitting: Sports Medicine

## 2018-10-25 ENCOUNTER — Ambulatory Visit (INDEPENDENT_AMBULATORY_CARE_PROVIDER_SITE_OTHER): Payer: Medicare Other | Admitting: Sports Medicine

## 2018-10-25 ENCOUNTER — Encounter: Payer: Self-pay | Admitting: Sports Medicine

## 2018-10-25 ENCOUNTER — Ambulatory Visit (INDEPENDENT_AMBULATORY_CARE_PROVIDER_SITE_OTHER): Payer: Medicare Other

## 2018-10-25 DIAGNOSIS — M79672 Pain in left foot: Secondary | ICD-10-CM | POA: Diagnosis not present

## 2018-10-25 DIAGNOSIS — M2142 Flat foot [pes planus] (acquired), left foot: Secondary | ICD-10-CM

## 2018-10-25 DIAGNOSIS — M2141 Flat foot [pes planus] (acquired), right foot: Secondary | ICD-10-CM | POA: Diagnosis not present

## 2018-10-25 DIAGNOSIS — M7750 Other enthesopathy of unspecified foot: Secondary | ICD-10-CM | POA: Diagnosis not present

## 2018-10-25 DIAGNOSIS — B351 Tinea unguium: Secondary | ICD-10-CM

## 2018-10-25 DIAGNOSIS — M79671 Pain in right foot: Secondary | ICD-10-CM

## 2018-10-25 DIAGNOSIS — M79676 Pain in unspecified toe(s): Secondary | ICD-10-CM | POA: Diagnosis not present

## 2018-10-25 DIAGNOSIS — M7751 Other enthesopathy of right foot: Secondary | ICD-10-CM | POA: Diagnosis not present

## 2018-10-25 NOTE — Progress Notes (Signed)
Subjective: [name] [name] is a 68 y.o. female patient seen today in office with complaint of mildly painful thickened and elongated Beg toenails; unable to trim.  Patient also admits to ongoing right foot and ankle pain reports that she was diagnosed with posterior tibial tendinitis and states that from time to time this flares up and can become bothersome.  Patient reports that she has had her restart in the past relief she has gotten from her ankle support states that the pain is off and on, 4 to 5 out of 10 sometimes it is worse in the morning 7-8 out of 10.  Patient denies any new history of injury and reports that she believes and holistic medicine and wants opinions on other days that she should try this point patient denies history of Diabetes.  Admits to a history of mitral valve and is on anticoagulant.  Admits to a history of cancer and has previously been on chemotherapy.  Patient has no other pedal complaints at this time.   Review of Systems  Musculoskeletal: Positive for joint pain.  All other systems reviewed and are negative.    Patient Active Problem List   Diagnosis Date Noted  . Nonischemic cardiomyopathy ([place]) 07/20/2018  . Chest pain 07/20/2018  . Scoliosis 11/20/2016  . Long term current use of anticoagulant 09/18/2016  . Compression fracture of lumbar spine, non-traumatic, sequela 08/11/2016  . Multiple falls 08/11/2016  . At high risk for injury related to fall 08/11/2016  . Encounter for therapeutic drug monitoring 11/27/2013  . Hypothyroidism   . CHF (congestive heart failure) ([place])   . Paroxysmal atrial fibrillation (H[facility])   . Arthritis   . Neurogenic bladder   . Warfarin anticoagulation   . First degree heart block 01/29/2012  . 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 S/P Maze operation for atrial fibrillation 01/26/2012  . Anxiety 01/18/2012  . Multiple sclerosis ([place]) 01/19/2007    Current Outpatient Medications on File Prior to Visit  Medication  Sig Dispense Refill  . amoxicillin (AMOXIL) 500 MG capsule take 4 capsules by mouth 1 hour prior to dental appointment  0  . Biotin 5000 MCG CAPS Take 20,000 mcg by mouth.     . Black Cohosh 40 MG CAPS Take 40 mg by mouth daily.    . Bromelains (BROMELAIN PO) Take 1 capsule by mouth 2 (two) times daily.    . calcium carbonate (OSCAL) 1500 (600 Ca) MG TABS tablet Takes 1 time daily    . Cholecalciferol (VITAMIN D3) 2000 units capsule Take 6,000 Units by mouth daily.     . Coenzyme Q10 (CO Q-10) 100 MG CAPS Take 1 capsule by mouth daily.     . Cranberry 500 MG CAPS Take 1 capsule by mouth daily.    Marland Kitchen L-THEANINE PO Take 1 capsule by mouth daily.     Marland Kitchen lactose free nutrition (BOOST PLUS) LIQD Take 237 mLs by mouth daily.     Marland Kitchen lidocaine-prilocaine (EMLA) cream Apply 1 application topically as needed (per pt this is for use before blood draws).   0  . lisinopril (PRINIVIL,ZESTRIL) 5 MG tablet Take 1 tablet (5 mg total) by mouth daily. 90 tablet 3  . lisinopril (PRINIVIL,ZESTRIL) 5 MG tablet Take 5 mg by mouth daily.  2  . Lysine 500 MG CAPS Take 1 capsule by mouth daily.    . Magnesium Citrate 200 MG TABS Take 200 mg by mouth daily as needed.    . metoprolol tartrate (LOPRESSOR)  25 MG tablet Take 1 tablet (25 mg total) by mouth 2 (two) times daily. 180 tablet 2  . MILK THISTLE PO Take 1 capsule by mouth 2 (two) times daily.    . Misc Natural Products (GLUCOSAMINE CHOND COMPLEX/MSM PO) Strength of dose Glucosamine 1500mg  /Chodroitron 1000mg / MSM 500mg  takes one twice daily    . NON FORMULARY 1 capsule 2 (two) times daily.    Jonna Coup Leaf 500 MG CAPS Take 1 capsule by mouth 2 (two) times daily.     Marland Kitchen OVER THE COUNTER MEDICATION Red marine algae - 1 capsules daily    . Petasin (PETADOLEX PO) Take by mouth 2 (two) times daily.    . Probiotic Product (PROBIOTIC DAILY PO) Take 1 capsule once a day    . PROGESTERONE MICRONIZED PO Take 100 mg by mouth daily.     . Pumpkin Seed (URIPLEX) 500 MG TABS Take  1 tablet by mouth 2 (two) times daily.    Marland Kitchen thyroid (ARMOUR) 90 MG tablet Take 90 mg by mouth daily.    . TURMERIC PO Take by mouth.    . vitamin A 10000 UNIT capsule Take 10,000 Units by mouth daily.    . vitamin E 400 UNIT capsule Take 400 Units by mouth daily.    Marland Kitchen warfarin (COUMADIN) 5 MG tablet TAKE 1 TABLET BY MOUTH AS DIRECTED BY COUMADIN CLINIC FOR 90 DAYS 105 tablet 0   No current facility-administered medications on file prior to visit.     Allergies  Allergen Reactions  . Erythromycin Nausea And Vomiting    Objective: Physical Exam  General: Well developed, nourished, no acute distress, awake, alert and oriented x 3  Vascular: Dorsalis pedis artery 1/4 bilateral, Posterior tibial artery /4 bilateral, skin temperature warm to warm proximal to distal bilateral lower extremities, mild varicosities, pedal hair present bilateral.  Neurological: Gross sensation present via light touch bilateral.   Dermatological: Skin is warm, dry, and supple bilateral, bilateral hallux nails are tender, long, thick, and discolored with mild subungal debris, no webspace macerations present bilateral growing in layers with rams horn appearance, no open lesions present bilateral, no callus/corns/hyperkeratotic tissue present bilateral. No signs of infection bilateral.  Musculoskeletal: Asymptomatic pes planus boney deformities noted bilateral right greater than left with mild pain along the posterior tibial tendon. Muscular strength within normal limits for patient status.  No pain with calf compression bilateral.  X-rays reveal decreased mineralization consistent with osteoporosis and diffuse arthritis.  No other acute findings.  Assessment and Plan:  Problem List Items Addressed This Visit    None    Visit Diagnoses    Tendinitis of ankle or foot    -  Primary   Relevant Orders   DG Ankle Complete Right (Completed)   Bilateral pes planus       Pain due to onychomycosis of toenail        Foot pain, bilateral          -Examined patient.  -Discussed treatment options for painful mycotic nails and chronic posterior tibial tendinitis in the setting of pes planus. -Mechanically debrided and reduced mycotic nails x2  with sterile nail nipper and dremel nail file without incident. -Advised patient to continue with good supportive device -Advised over-the-counter topical pain creams and rubs and preferred holistic approaches for management of the inflammation -Patient to return as needed or sooner if symptoms worsen.  Landis Martins, DPM

## 2018-11-01 ENCOUNTER — Encounter: Payer: Self-pay | Admitting: Cardiology

## 2018-11-01 ENCOUNTER — Ambulatory Visit (INDEPENDENT_AMBULATORY_CARE_PROVIDER_SITE_OTHER): Payer: Medicare Other

## 2018-11-01 ENCOUNTER — Other Ambulatory Visit (INDEPENDENT_AMBULATORY_CARE_PROVIDER_SITE_OTHER): Payer: Medicare Other

## 2018-11-01 ENCOUNTER — Ambulatory Visit (INDEPENDENT_AMBULATORY_CARE_PROVIDER_SITE_OTHER): Payer: Medicare Other | Admitting: Cardiology

## 2018-11-01 VITALS — BP 116/64 | HR 43 | Ht 69.0 in | Wt 142.8 lb

## 2018-11-01 DIAGNOSIS — I493 Ventricular premature depolarization: Secondary | ICD-10-CM

## 2018-11-01 DIAGNOSIS — R002 Palpitations: Secondary | ICD-10-CM

## 2018-11-01 DIAGNOSIS — Z9889 Other specified postprocedural states: Secondary | ICD-10-CM

## 2018-11-01 DIAGNOSIS — I509 Heart failure, unspecified: Secondary | ICD-10-CM

## 2018-11-01 DIAGNOSIS — I480 Paroxysmal atrial fibrillation: Secondary | ICD-10-CM

## 2018-11-01 DIAGNOSIS — Z8679 Personal history of other diseases of the circulatory system: Secondary | ICD-10-CM | POA: Diagnosis not present

## 2018-11-01 DIAGNOSIS — Z5181 Encounter for therapeutic drug level monitoring: Secondary | ICD-10-CM | POA: Diagnosis not present

## 2018-11-01 DIAGNOSIS — Z952 Presence of prosthetic heart valve: Secondary | ICD-10-CM | POA: Diagnosis not present

## 2018-11-01 LAB — POCT INR: INR: 3 (ref 2.0–3.0)

## 2018-11-01 MED ORDER — WARFARIN SODIUM 5 MG PO TABS: ORAL_TABLET | ORAL | 0 refills | Status: DC

## 2018-11-01 NOTE — Patient Instructions (Signed)
Continue taking 1 tablet everyday EXCEPT 1/2 TABLET ON SUNDAYS.  Recheck in 6 weeks per request. Call with any new medications or procedures [phone]

## 2018-11-01 NOTE — Patient Instructions (Signed)
Medication Instructions:  NONE If you need a refill on your cardiac medications before your next appointment, please call your pharmacy.   Lab work: NONE If you have labs (blood work) drawn today and your tests are completely normal, you will receive your results only by: Marland Kitchen MyChart Message (if you have MyChart) OR . A paper copy in the mail If you have any lab test that is abnormal or we need to change your treatment, we will call you to review the results.  Testing/Procedures: ZIO PATCH 3 DAY  Follow-Up: At [facility], you and your health needs are our priority.  As part of our continuing mission to provide you with exceptional heart care, we have created designated Provider Care Teams.  These Care Teams include your primary Cardiologist (physician) and Advanced Practice Providers (APPs -  Physician Assistants and Nurse Practitioners) who all work together to provide you with the care you need, when you need it. You will need a follow up appointment in 6 months.  Please call our office 2 months in advance to schedule this appointment.  You may see Dr Meda Coffee or one of the following Advanced Practice Providers on your designated Care Team:   [place], PA-C Melina Copa, PA-C . Ermalinda Barrios, PA-C  Any Other Special Instructions Will Be Listed Below (If Applicable).

## 2018-11-01 NOTE — Progress Notes (Signed)
.   11/01/2018 [name] [name]   11-Sep-1951  [id number]  Primary Physician [name] Hillock, DO Primary Cardiologist: [name] [name]  Electrophysiologist: none   Reason for Visit/CC: F/u for H/o Mitral Valve Disorder and Systolic HF  HPI:  [name] [name] is a 68 y.o. female who is being seen today for f/u given h/o mitral valve disease and systolic CHF. She is followed by [name] [name]. In 2013, she underwent mechanical mitral valve replacement w/ Maze procedure and is on chronic coumadin for anticoagulation. Her INRs are followed in our Coumadin clinic. She also has a h/o multiple sclerosis and breast cancer. Her most recent 2D Echo was 10/2017. This showed normally functioning mitral valve prosthesis but mildly reduced LVEF, compared to prior study, at 45-50% with diffuse hypokinesis. [name] [name] recommended she be started on an ACEi and low dose lisinopril was added. Pt was last seen 07/20/18 by [name] Barrios, PA-C. At that time, she complained of atypical CP that was c/w musculoskeletal etiology.  Symptoms were noncardiac. Pt was reassured and advised to resume regular f/u.   She presents back today for routine f/u. She reports doing well. No ischemic like CP and no dyspnea. Her only concern is low HR readings using her pulse Ox. She notes HRs in the low 50s. She denies dizziness, syncope/ near syncope and fatigue. Interestingly, initial office pulse rate was 43 bpm by pulse Ox but EKG shows NSR w/ HR at 65 BPM. No PVCs on EKG.  She does have some irregularity on cardiac auscultation. She is on metoprolol 25 mg BID.   Cardiac Studies   2D Echo 10/2018 LV EF: 45% -   50%  ------------------------------------------------------------------- Indications:      I05.9 Mitral Valve Disorder.  ------------------------------------------------------------------- History:   PMH:  Acquired from the patient and from the patient&'s chart.  PMH:  Anxiety. COPD. CHF. Hypothyroidism. Multiple sclerosis.  Paroxysmal atrial fibrillation. Pulmonary hypertension.   ------------------------------------------------------------------- Study Conclusions  - Left ventricle: The cavity size was normal. Wall thickness was   normal. Systolic function was mildly reduced. The estimated   ejection fraction was in the range of 45% to 50%. Diffuse   hypokinesis. The study is not technically sufficient to allow   evaluation of LV diastolic function. - Mitral valve: Mechanical mitral valve intact. - Left atrium: The atrium was mildly dilated. Volume/bsa, ES   (1-plane Simpson&'s, A4C): 35.9 ml/m^2.  Recommendations:  EF is mildly reduced when compared to prior study.  Current Meds  Medication Sig  . amoxicillin (AMOXIL) 500 MG capsule take 4 capsules by mouth 1 hour prior to dental appointment  . Biotin 5000 MCG CAPS Take 20,000 mcg by mouth.   . Black Cohosh 40 MG CAPS Take 40 mg by mouth daily.  . Bromelains (BROMELAIN PO) Take 1 capsule by mouth 2 (two) times daily.  . calcium carbonate (OSCAL) 1500 (600 Ca) MG TABS tablet Takes 1 time daily  . Cholecalciferol (VITAMIN D3) 2000 units capsule Take 6,000 Units by mouth daily.   . Coenzyme Q10 (CO Q-10) 100 MG CAPS Take 1 capsule by mouth daily.   . Cranberry 500 MG CAPS Take 1 capsule by mouth daily.  Marland Kitchen L-THEANINE PO Take 1 capsule by mouth daily.   Marland Kitchen lactose free nutrition (BOOST PLUS) LIQD Take 237 mLs by mouth daily.   Marland Kitchen lidocaine-prilocaine (EMLA) cream Apply 1 application topically as needed (per pt this is for use before blood draws).   Marland Kitchen lisinopril (PRINIVIL,ZESTRIL) 5 MG tablet Take 5  mg by mouth daily.  Marland Kitchen Lysine 500 MG CAPS Take 1 capsule by mouth daily.  . Magnesium Citrate 200 MG TABS Take 200 mg by mouth daily as needed.  . metoprolol tartrate (LOPRESSOR) 25 MG tablet Take 1 tablet (25 mg total) by mouth 2 (two) times daily.  Marland Kitchen MILK THISTLE PO Take 1 capsule by mouth 2 (two) times daily.  . Misc Natural Products (GLUCOSAMINE CHOND  COMPLEX/MSM PO) Strength of dose Glucosamine 1500mg  /Chodroitron 1000mg / MSM 500mg  takes one twice daily  . NON FORMULARY 1 capsule 2 (two) times daily.  Jonna Coup Leaf 500 MG CAPS Take 1 capsule by mouth 2 (two) times daily.   Marland Kitchen OVER THE COUNTER MEDICATION Red marine algae - 1 capsules daily  . Petasin (PETADOLEX PO) Take by mouth 2 (two) times daily.  . Probiotic Product (PROBIOTIC DAILY PO) Take 1 capsule once a day  . PROGESTERONE MICRONIZED PO Take 100 mg by mouth daily.   . Pumpkin Seed (URIPLEX) 500 MG TABS Take 1 tablet by mouth 2 (two) times daily.  Marland Kitchen thyroid (ARMOUR) 90 MG tablet Take 90 mg by mouth daily.  . TURMERIC PO Take by mouth.  . vitamin A 10000 UNIT capsule Take 10,000 Units by mouth daily.  . vitamin E 400 UNIT capsule Take 400 Units by mouth daily.  Marland Kitchen warfarin (COUMADIN) 5 MG tablet TAKE 1 TABLET BY MOUTH AS DIRECTED BY COUMADIN CLINIC FOR 90 DAYS   Allergies  Allergen Reactions  . Erythromycin Nausea And Vomiting   Past Medical History:  Diagnosis Date  . Anxiety   . Arthritis    knees  . Breast cancer ([place]) 1999  . CHF (congestive heart failure) ([place])    Related to severe mitral regurgitation, April, 2013  . 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 Assess by pulmonary team in the hospital April, 2013  . Ejection fraction    EF 60%, echo, April, 2013, with severe MR before mitral valve replacement  . Herpes   . Hypothyroidism   . IBS (irritable bowel syndrome)   . Mitral valve regurgitation    Mitral valve replacement April, 2013, Mitral valve prolapse  . Multiple sclerosis ([place])   . Neurogenic bladder   . Osteoporosis   . Ovarian cyst   . Paroxysmal atrial fibrillation ([facility])    Rapid atrial fibrillation in-hospital, Rapid cardioversion,  before mitral valve surgery  . Pulmonary hypertension ([place])    Echo, April, 2013, before mitral valve surgery  . S/P Maze operation for atrial fibrillation 01/26/2012   Complete  biatrial lesion set using cryothermy via right mini thoracotomy  . S/P mitral valve replacement 01/26/2012   18mm Sorin [employer] Optiform mechanical prosthesis via right mini thoracotomy  . Warfarin anticoagulation    Mechanical mitral prosthesis, April, 20136   Family History  Problem Relation Age of Onset  . Heart disease Father        cardiac arrest   . CAD Father   . Hypertension Father   . CAD Mother        5 stents and numerous bypass surgery  . Hypertension Mother   . Hyperlipidemia Mother   . CAD Unknown   . Breast cancer Maternal Grandmother   . Breast cancer Maternal Aunt    Past Surgical History:  Procedure Laterality Date  . BREAST LUMPECTOMY Right 1999   with sent.node, and axillary dissection (18)  . CHEST TUBE INSERTION  01/26/2012   Procedure: CHEST TUBE INSERTION;  Surgeon: Rexene Alberts, MD;  Location: [facility];  Service: Open Heart Surgery;  Laterality: Left;  . COLONOSCOPY  2010   "normal"  . CYSTOSCOPY  1992  . LAPAROSCOPIC OVARIAN CYSTECTOMY  1978   urethral stricture repair  . LEFT AND RIGHT HEART CATHETERIZATION WITH CORONARY ANGIOGRAM N/A 01/20/2012   Procedure: LEFT AND RIGHT HEART CATHETERIZATION WITH CORONARY ANGIOGRAM;  Surgeon: Burnell Blanks, MD;  Location: [facility] CATH LAB;  Service: Cardiovascular;  Laterality: N/A;  . LYMPHADENECTOMY    . MAZE  01/26/2012   Procedure: MAZE;  Surgeon: Rexene Alberts, MD;  Location: [facility];  Service: Open Heart Surgery;  Laterality: N/A;  . MITRAL VALVE REPLACEMENT  01/26/2012   Procedure: MINIMALLY INVASIVE MITRAL VALVE (MV) REPLACEMENT;  Surgeon: Rexene Alberts, MD;  Location: [facility];  Service: Open Heart Surgery;  Laterality: Right;  . TEE WITHOUT CARDIOVERSION  01/19/2012   Procedure: TRANSESOPHAGEAL ECHOCARDIOGRAM (TEE);  Surgeon: Peter M Martinique, MD;  Location: [facility] ENDOSCOPY;  Service: Cardiovascular;  Laterality: N/A;  . TONSILLECTOMY  1970  . [place]  . WRIST SURGERY Right 2012   Social  History   Socioeconomic History  . Marital status: Widowed    Spouse name: Not on file  . Number of children: 2  . Years of education: 102  . Highest education level: Not on file  Occupational History  . Occupation: Retired  Scientific laboratory technician  . Financial resource strain: Not on file  . Food insecurity:    Worry: Not on file    Inability: Not on file  . Transportation needs:    Medical: Not on file    Non-medical: Not on file  Tobacco Use  . Smoking status: Never Smoker  . Smokeless tobacco: Never Used  Substance and Sexual Activity  . Alcohol use: No  . Drug use: No  . Sexual activity: Never    Birth control/protection: None  Lifestyle  . Physical activity:    Days per week: Not on file    Minutes per session: Not on file  . Stress: Not on file  Relationships  . Social connections:    Talks on phone: Not on file    Gets together: Not on file    Attends religious service: Not on file    Active member of club or organization: Not on file    Attends meetings of clubs or organizations: Not on file    Relationship status: Not on file  . Intimate partner violence:    Fear of current or ex partner: Not on file    Emotionally abused: Not on file    Physically abused: Not on file    Forced sexual activity: Not on file  Other Topics Concern  . Not on file  Social History Narrative   Patient is a widower (since 29), he currently lives with her son and daughter-in-law. She has 2 children.   She is college educated, and retired from the [employer].   She uses herbal remedies, and multiple over-the-counter supplements. She takes a daily vitamin.   She wears her seatbelt, exercises routinely, smoke detector in the home.   Requires a walker or wheelchair at times.   Feels safe in her relationships.     Review of Systems: General: negative for chills, fever, night sweats or weight changes.  Cardiovascular: negative for chest pain, dyspnea on exertion, edema, orthopnea,  palpitations, paroxysmal nocturnal dyspnea or shortness of breath Dermatological: negative for rash Respiratory:  negative for cough or wheezing Urologic: negative for hematuria Abdominal: negative for nausea, vomiting, diarrhea, bright red blood per rectum, melena, or hematemesis Neurologic: negative for visual changes, syncope, or dizziness All other systems reviewed and are otherwise negative except as noted above.   Physical Exam:  Blood pressure 116/64, pulse (!) 43, height 5\' 9"  (1.753 m), weight 142 lb 12.8 oz (64.8 kg), SpO2 97 %.  General appearance: alert, cooperative, no distress and ambulating w/ cane today Neck: no adenopathy, no JVD and thyroid not enlarged, symmetric, no tenderness/mass/nodules Lungs: clear to auscultation bilaterally Heart: irregular rthythm, regular rate Extremities: extremities normal, atraumatic, no cyanosis or edema Pulses: 2+ and symmetric Skin: Skin color, texture, turgor normal. No rashes or lesions Neurologic: Grossly normal  EKG NSR 65 bpm -- personally reviewed   ASSESSMENT AND PLAN:   1. Mitral Valve Disorder: s/p mechanical mitral valve replacement w/ Maze procedure in 2013. 2D echo 10/2017 showed normal functioning mitral valve prosthesis. She is on chronic coumadin for mechanical valve and reports full compliance. She denies CP and no dyspnea.    2. Chronic Anticoagulation: on chronic coumadin for mechanical mitral valve. INRs are followed in our coumadin clinic. Pt had coumadin clinic visit today. Dosing recs per pharmD.   3. Systolic HF: mildly reduced LVEF noted on Echo 10/2017. EF was 45-50% and low dose lisinopril was added. Also on metoprolol.  She reports full compliance w/ meds and tolerating well. BP is stable. No s/s of acute HF. She denies dyspnea and euvolemic on exam.   4. ? Bradycardia vs PVCs: Pt reports low HR readings at home w/ her pulse Ox. She notes HRs in the low 50s. She denies dizziness, syncope/ near syncope and  fatigue. Interestingly, initial office pulse rate was 43 bpm by pulse Ox but EKG shows NSR w/ HR at 65 bpm. No PVCs on EKG.  She does have some irregularity on cardiac auscultation. Also last echo showed mildly reduced LVEF. ? PVCs. Will order a  3 day Zio patch to assess for PVCs and burden calculation if present, as well as screen for bradycardia. Continue current dose of metoprolol for now. Further w/u and treatment TBD based on monitor findings.   Follow-Up w/ [name] [name] in 6 months.   Elgie Landino Ladoris Gene, MHS [facility] 11/01/2018 3:37 PM

## 2018-11-08 ENCOUNTER — Other Ambulatory Visit: Payer: Self-pay | Admitting: Cardiology

## 2018-11-30 DIAGNOSIS — R002 Palpitations: Secondary | ICD-10-CM | POA: Diagnosis not present

## 2018-11-30 DIAGNOSIS — I480 Paroxysmal atrial fibrillation: Secondary | ICD-10-CM | POA: Diagnosis not present

## 2018-11-30 DIAGNOSIS — I493 Ventricular premature depolarization: Secondary | ICD-10-CM | POA: Diagnosis not present

## 2018-12-02 ENCOUNTER — Telehealth: Payer: Self-pay | Admitting: Cardiology

## 2018-12-02 DIAGNOSIS — I493 Ventricular premature depolarization: Secondary | ICD-10-CM

## 2018-12-02 NOTE — Telephone Encounter (Signed)
New Message   Patient wants to speak to [place] about see EP doctors as she recommended during last visit.

## 2018-12-02 NOTE — Telephone Encounter (Signed)
Spoke to the patient who is calling because she saw her monitor results and recommendations on My Chart.  She called inquiring about the EP referral.  I told her that I would forward to have an appt made for her to come in for OV.  I had a lengthy conversation with the patient reviewing results, which highly benefited her.  She was thankful for the call.

## 2018-12-16 NOTE — Progress Notes (Signed)
NEUROLOGY FOLLOW UP OFFICE NOTE  [name] [name] [id number]  HISTORY OF PRESENT ILLNESS: [name] [name] is a 68 year old right-handed Caucasian woman with COPD, CHF, mitral valve replacement, hypothyroidism who follows up for multiple sclerosis.  UPDATE: Current DMT:  None.  She has deferred prior recommendations. Other current medication:  D3 6000 IU daily.  Vitamin D level from 08/02/2018 was 70.  Vision:  Cataracts Motor:  No weakness Sensory:  No issues Pain:  stable Gait:  stable Bowel/Bladder:  stable Fatigue: She feels she has a little more stamina.   Cognition:  No issues Mood:  No issues  HISTORY: She was diagnosed with multiple sclerosis in 1994.  At the time, she exhibited tingling in her hands as well as frequent falls.  She saw a neurologist who performed an MRI of the brain that revealed white matter lesions.  She underwent a lumbar puncture which reportedly demonstrated possible elevated IgG index.  It was recommended to start Avonex, but she declined, fearing potential side effects.  She has never been on disease modifying therapy.  She has not had any clear MS flares.  She has chronic symptoms, such as falls, neurogenic bladder and occasional tingling in the left hand.  She used to have muscle spasms in the right arm many years ago, which has resolved.  Over the past several months, she has started to use a rolling walker due to increased gait difficulty which she attributes to her MS-related chronic gait instability, as well as arthritis and scoliosis.  She currently is undergoing physical therapy and sees a Physiological scientist once a week.  She has a urologist.  She has never had optic neuritis or focal/unilateral weakness.   Imaging: MRI of brain without contrast was performed on 07/11/14 revealed supratentorial white matter lesions consistent with demyelinating disease as well as diffuse cortical atrophy.  CT of head from 01/31/16 was personally reviewed and  revealed mild diffuse atrophy with chronic periventricular small vessel disease with remote infarct in the right centrum semiovale.  She has sustained L2 and L5 compression fractures with height loss in the past due to her falls.  MRI of brain without contrast from 02/18/17 was personally reviewed and was stable compared to prior imaging from 07/11/14.  It revealed multiple lesions in the periventricular and juxta cortical white matter, as well as within the right brachium pontis.  MRI of cervical spine revealed thin appearance of cervical cord without demyelination.    PAST MEDICAL HISTORY: Past Medical History:  Diagnosis Date  . Anxiety   . Arthritis    knees  . Breast cancer ([place]) 1999  . CHF (congestive heart failure) ([place])    Related to severe mitral regurgitation, April, 2013  . 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 Assess by pulmonary team in the hospital April, 2013  . Ejection fraction    EF 60%, echo, April, 2013, with severe MR before mitral valve replacement  . Herpes   . Hypothyroidism   . IBS (irritable bowel syndrome)   . Mitral valve regurgitation    Mitral valve replacement April, 2013, Mitral valve prolapse  . Multiple sclerosis ([place])   . Neurogenic bladder   . Osteoporosis   . Ovarian cyst   . Paroxysmal atrial fibrillation (HCC)    Rapid atrial fibrillation in-hospital, Rapid cardioversion,  before mitral valve surgery  . Pulmonary hypertension ([place])    Echo, April, 2013, before mitral valve surgery  . S/P Maze operation for atrial  fibrillation 01/26/2012   Complete biatrial lesion set using cryothermy via right mini thoracotomy  . S/P mitral valve replacement 01/26/2012   101mm Sorin Carbomedics Optiform mechanical prosthesis via right mini thoracotomy  . Warfarin anticoagulation    Mechanical mitral prosthesis, April, 20136    MEDICATIONS: Current Outpatient Medications on File Prior to Visit  Medication Sig Dispense Refill   . amoxicillin (AMOXIL) 500 MG capsule take 4 capsules by mouth 1 hour prior to dental appointment  0  . Biotin 5000 MCG CAPS Take 20,000 mcg by mouth.     . Black Cohosh 40 MG CAPS Take 40 mg by mouth daily.    . Bromelains (BROMELAIN PO) Take 1 capsule by mouth 2 (two) times daily.    . calcium carbonate (OSCAL) 1500 (600 Ca) MG TABS tablet Takes 1 time daily    . Cholecalciferol (VITAMIN D3) 2000 units capsule Take 6,000 Units by mouth daily.     . Coenzyme Q10 (CO Q-10) 100 MG CAPS Take 1 capsule by mouth daily.     . Cranberry 500 MG CAPS Take 1 capsule by mouth daily.    Marland Kitchen L-THEANINE PO Take 1 capsule by mouth daily.     Marland Kitchen lactose free nutrition (BOOST PLUS) LIQD Take 237 mLs by mouth daily.     Marland Kitchen lidocaine-prilocaine (EMLA) cream Apply 1 application topically as needed (per pt this is for use before blood draws).   0  . lisinopril (PRINIVIL,ZESTRIL) 5 MG tablet TAKE 1 TABLET BY MOUTH ONCE DAILY 90 tablet 3  . Lysine 500 MG CAPS Take 1 capsule by mouth daily.    . Magnesium Citrate 200 MG TABS Take 200 mg by mouth daily as needed.    . metoprolol tartrate (LOPRESSOR) 25 MG tablet Take 1 tablet (25 mg total) by mouth 2 (two) times daily. 180 tablet 2  . MILK THISTLE PO Take 1 capsule by mouth 2 (two) times daily.    . Misc Natural Products (GLUCOSAMINE CHOND COMPLEX/MSM PO) Strength of dose Glucosamine 1500mg  /Chodroitron 1000mg / MSM 500mg  takes one twice daily    . NON FORMULARY 1 capsule 2 (two) times daily.    Jonna Coup Leaf 500 MG CAPS Take 1 capsule by mouth 2 (two) times daily.     Marland Kitchen OVER THE COUNTER MEDICATION Red marine algae - 1 capsules daily    . Petasin (PETADOLEX PO) Take by mouth 2 (two) times daily.    . Probiotic Product (PROBIOTIC DAILY PO) Take 1 capsule once a day    . PROGESTERONE MICRONIZED PO Take 100 mg by mouth daily.     . Pumpkin Seed (URIPLEX) 500 MG TABS Take 1 tablet by mouth 2 (two) times daily.    Marland Kitchen thyroid (ARMOUR) 90 MG tablet Take 90 mg by mouth daily.      . TURMERIC PO Take by mouth.    . vitamin A 10000 UNIT capsule Take 10,000 Units by mouth daily.    . vitamin E 400 UNIT capsule Take 400 Units by mouth daily.    Marland Kitchen warfarin (COUMADIN) 5 MG tablet TAKE 1 TABLET BY MOUTH AS DIRECTED BY COUMADIN CLINIC FOR 90 DAYS 105 tablet 0   No current facility-administered medications on file prior to visit.     ALLERGIES: Allergies  Allergen Reactions  . Erythromycin Nausea And Vomiting    FAMILY HISTORY: Family History  Problem Relation Age of Onset  . Heart disease Father        cardiac arrest   .  CAD Father   . Hypertension Father   . CAD Mother        5 stents and numerous bypass surgery  . Hypertension Mother   . Hyperlipidemia Mother   . CAD Unknown   . Breast cancer Maternal Grandmother   . Breast cancer Maternal Aunt     SOCIAL HISTORY: Social History   Socioeconomic History  . Marital status: Widowed    Spouse name: Not on file  . Number of children: 2  . Years of education: 37  . Highest education level: Not on file  Occupational History  . Occupation: Retired  Scientific laboratory technician  . Financial resource strain: Not on file  . Food insecurity:    Worry: Not on file    Inability: Not on file  . Transportation needs:    Medical: Not on file    Non-medical: Not on file  Tobacco Use  . Smoking status: Never Smoker  . Smokeless tobacco: Never Used  Substance and Sexual Activity  . Alcohol use: No  . Drug use: No  . Sexual activity: Never    Birth control/protection: None  Lifestyle  . Physical activity:    Days per week: Not on file    Minutes per session: Not on file  . Stress: Not on file  Relationships  . Social connections:    Talks on phone: Not on file    Gets together: Not on file    Attends religious service: Not on file    Active member of club or organization: Not on file    Attends meetings of clubs or organizations: Not on file    Relationship status: Not on file  . Intimate partner violence:     Fear of current or ex partner: Not on file    Emotionally abused: Not on file    Physically abused: Not on file    Forced sexual activity: Not on file  Other Topics Concern  . Not on file  Social History Narrative   Patient is a widower (since 49), he currently lives with her son and daughter-in-law. She has 2 children.   She is college educated, and retired from the [employer].   She uses herbal remedies, and multiple over-the-counter supplements. She takes a daily vitamin.   She wears her seatbelt, exercises routinely, smoke detector in the home.   Requires a walker or wheelchair at times.   Feels safe in her relationships.    REVIEW OF SYSTEMS: Constitutional: No fevers, chills, or sweats, no generalized fatigue, change in appetite Eyes: No visual changes, double vision, eye pain Ear, nose and throat: No hearing loss, ear pain, nasal congestion, sore throat Cardiovascular: No chest pain, palpitations Respiratory:  No shortness of breath at rest or with exertion, wheezes GastrointestinaI: No nausea, vomiting, diarrhea, abdominal pain, fecal incontinence Genitourinary:  No dysuria, urinary retention or frequency Musculoskeletal:  No neck pain, back pain Integumentary: No rash, pruritus, skin lesions Neurological: as above Psychiatric: No depression, insomnia, anxiety Endocrine: No palpitations, fatigue, diaphoresis, mood swings, change in appetite, change in weight, increased thirst Hematologic/Lymphatic:  No purpura, petechiae. Allergic/Immunologic: no itchy/runny eyes, nasal congestion, recent allergic reactions, rashes  PHYSICAL EXAM: Blood pressure 126/60, pulse 78, height 5\' 9"  (1.753 m), weight 142 lb (64.4 kg), SpO2 96 %. General: No acute distress.  Patient appears well-groomed.   Head:  Normocephalic/atraumatic Eyes:  Fundi examined but not visualized Neck: supple, no paraspinal tenderness, full range of motion Heart:  Regular rate and rhythm  Lungs:  Clear to  auscultation bilaterally Back: No paraspinal tenderness Neurological Exam: alert and oriented to person, place, and time. Attention span and concentration intact, recent and remote memory intact, fund of knowledge intact.  Speech fluent and not dysarthric, language intact.  CN II-XII intact.  Decreased bulk in lower extremities.  Muscle strength 4+/5 bilateral hip flexion, 5-/5 knee extension/flexion, otherwise muscle strength 5/5 throughout.  Sensation to light touch intact.  Deep tendon reflexes 2+ throughout, bilateral Babinski.  Finger-to-nose testing intact.  Wide-based gait with dorsal side bent to the right.  Romberg positive  IMPRESSION: Multiple sclerosis  PLAN: 1.  Continue D3 6000 IU daily. 2.  Repeat MRI of brain with and without contrast and vitamin D level in one year.  25 minutes spent face to face with patient, over 50% spent discussing management.  Metta Clines, DO  CC: Howard Pouch, DO

## 2018-12-19 ENCOUNTER — Ambulatory Visit (INDEPENDENT_AMBULATORY_CARE_PROVIDER_SITE_OTHER): Payer: Medicare Other | Admitting: Neurology

## 2018-12-19 ENCOUNTER — Encounter: Payer: Self-pay | Admitting: Neurology

## 2018-12-19 VITALS — BP 126/60 | HR 78 | Ht 69.0 in | Wt 142.0 lb

## 2018-12-19 DIAGNOSIS — G35 Multiple sclerosis: Secondary | ICD-10-CM

## 2018-12-19 DIAGNOSIS — E559 Vitamin D deficiency, unspecified: Secondary | ICD-10-CM | POA: Diagnosis not present

## 2018-12-19 DIAGNOSIS — Z79899 Other long term (current) drug therapy: Secondary | ICD-10-CM | POA: Diagnosis not present

## 2018-12-19 NOTE — Addendum Note (Signed)
Addended by: Clois Comber on: 12/19/2018 03:22 PM   Modules accepted: Orders

## 2018-12-19 NOTE — Patient Instructions (Addendum)
1.  Check MRI of brain with and without contrast and vitamin D level in one year 2.  Follow up in one year (after repeat testing)  We have sent a referral to [facility] for your MRI and they will call you directly to schedule your appt. Please schedule for late February 2021. They are located at [place]. If you need to contact them directly please call [phone].

## 2018-12-20 ENCOUNTER — Ambulatory Visit (INDEPENDENT_AMBULATORY_CARE_PROVIDER_SITE_OTHER): Payer: Medicare Other | Admitting: Internal Medicine

## 2018-12-20 ENCOUNTER — Encounter: Payer: Self-pay | Admitting: Internal Medicine

## 2018-12-20 VITALS — BP 118/72 | HR 87 | Ht 69.0 in | Wt 141.0 lb

## 2018-12-20 DIAGNOSIS — I493 Ventricular premature depolarization: Secondary | ICD-10-CM

## 2018-12-20 NOTE — Progress Notes (Signed)
HPI [name] [name] is referred today by Dr. Meda Coffee and [name] [name] for evaluation of PVC's. She is a pleasant 68 yo woman with a h/o PVC's, s/p remote MV replacement, MAZE, h/o diffuse hypokinesis with EF 45%. She was found to have about 10% PVC's by heart monitor and she was symptomatic. She then began to take omega vitamins and her symptoms have  Resolved. She has not had syncope.  Allergies  Allergen Reactions  . Erythromycin Nausea And Vomiting     Current Outpatient Medications  Medication Sig Dispense Refill  . amoxicillin (AMOXIL) 500 MG capsule take 4 capsules by mouth 1 hour prior to dental appointment  0  . Biotin 5000 MCG CAPS Take 20,000 mcg by mouth.     . Black Cohosh 40 MG CAPS Take 40 mg by mouth daily.    . Bromelains (BROMELAIN PO) Take 1 capsule by mouth 2 (two) times daily.    . calcium carbonate (OSCAL) 1500 (600 Ca) MG TABS tablet Takes 1 time daily    . Cholecalciferol (VITAMIN D3) 2000 units capsule Take 6,000 Units by mouth daily.     . Coenzyme Q10 (CO Q-10) 100 MG CAPS Take 1 capsule by mouth daily.     . Cranberry 500 MG CAPS Take 1 capsule by mouth daily.    Marland Kitchen L-THEANINE PO Take 1 capsule by mouth daily.     Marland Kitchen lactose free nutrition (BOOST PLUS) LIQD Take 237 mLs by mouth daily.     Marland Kitchen lidocaine-prilocaine (EMLA) cream Apply 1 application topically as needed (per pt this is for use before blood draws).   0  . lisinopril (PRINIVIL,ZESTRIL) 5 MG tablet TAKE 1 TABLET BY MOUTH ONCE DAILY 90 tablet 3  . Lysine 500 MG CAPS Take 1 capsule by mouth daily.    . Magnesium Citrate 200 MG TABS Take 200 mg by mouth daily as needed.    . metoprolol tartrate (LOPRESSOR) 25 MG tablet Take 1 tablet (25 mg total) by mouth 2 (two) times daily. 180 tablet 2  . MILK THISTLE PO Take 1 capsule by mouth 2 (two) times daily.    . Misc Natural Products (GLUCOSAMINE CHOND COMPLEX/MSM PO) Strength of dose Glucosamine 1500mg  /Chodroitron 1000mg / MSM 500mg  takes one twice daily     . NON FORMULARY 1 capsule 2 (two) times daily.    Jonna Coup Leaf 500 MG CAPS Take 1 capsule by mouth 2 (two) times daily.     Marland Kitchen OVER THE COUNTER MEDICATION Red marine algae - 1 capsules daily    . Petasin (PETADOLEX PO) Take by mouth 2 (two) times daily.    . Probiotic Product (PROBIOTIC DAILY PO) Take 1 capsule once a day    . PROGESTERONE MICRONIZED PO Take 100 mg by mouth daily.     . Pumpkin Seed (URIPLEX) 500 MG TABS Take 1 tablet by mouth 2 (two) times daily.    Marland Kitchen thyroid (ARMOUR) 90 MG tablet Take 90 mg by mouth daily.    . TURMERIC PO Take by mouth.    . vitamin A 10000 UNIT capsule Take 10,000 Units by mouth daily.    . vitamin E 400 UNIT capsule Take 400 Units by mouth daily.    Marland Kitchen warfarin (COUMADIN) 5 MG tablet TAKE 1 TABLET BY MOUTH AS DIRECTED BY COUMADIN CLINIC FOR 90 DAYS 105 tablet 0   No current facility-administered medications for this visit.      Past Medical History:  Diagnosis Date  .  Anxiety   . Arthritis    knees  . Breast cancer ([place]) 1999  . CHF (congestive heart failure) ([place])    Related to severe mitral regurgitation, April, 2013  . 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 Assess by pulmonary team in the hospital April, 2013  . Ejection fraction    EF 60%, echo, April, 2013, with severe MR before mitral valve replacement  . Herpes   . Hypothyroidism   . IBS (irritable bowel syndrome)   . Mitral valve regurgitation    Mitral valve replacement April, 2013, Mitral valve prolapse  . Multiple sclerosis ([place])   . Neurogenic bladder   . Osteoporosis   . Ovarian cyst   . Paroxysmal atrial fibrillation ([facility])    Rapid atrial fibrillation in-hospital, Rapid cardioversion,  before mitral valve surgery  . Pulmonary hypertension ([place])    Echo, April, 2013, before mitral valve surgery  . S/P Maze operation for atrial fibrillation 01/26/2012   Complete biatrial lesion set using cryothermy via right mini thoracotomy  . S/P mitral  valve replacement 01/26/2012   18mm Sorin Carbomedics Optiform mechanical prosthesis via right mini thoracotomy  . Warfarin anticoagulation    Mechanical mitral prosthesis, April, 20136    ROS:   All systems reviewed and negative except as noted in the HPI.   Past Surgical History:  Procedure Laterality Date  . BREAST LUMPECTOMY Right 1999   with sent.node, and axillary dissection (20)  . CHEST TUBE INSERTION  01/26/2012   Procedure: CHEST TUBE INSERTION;  Surgeon: Rexene Alberts, MD;  Location: [place];  Service: Open Heart Surgery;  Laterality: Left;  . COLONOSCOPY  2010   "normal"  . CYSTOSCOPY  1992  . LAPAROSCOPIC OVARIAN CYSTECTOMY  1978   urethral stricture repair  . LEFT AND RIGHT HEART CATHETERIZATION WITH CORONARY ANGIOGRAM N/A 01/20/2012   Procedure: LEFT AND RIGHT HEART CATHETERIZATION WITH CORONARY ANGIOGRAM;  Surgeon: Burnell Blanks, MD;  [facility];  Service: Cardiovascular;  Laterality: N/A;  . LYMPHADENECTOMY    . MAZE  01/26/2012   Procedure: MAZE;  Surgeon: Rexene Alberts, MD;  Location: [place];  Service: Open Heart Surgery;  Laterality: N/A;  . MITRAL VALVE REPLACEMENT  01/26/2012   Procedure: MINIMALLY INVASIVE MITRAL VALVE (MV) REPLACEMENT;  Surgeon: Rexene Alberts, MD;  Location: [place];  Service: Open Heart Surgery;  Laterality: Right;  . TEE WITHOUT CARDIOVERSION  01/19/2012   Procedure: TRANSESOPHAGEAL ECHOCARDIOGRAM (TEE);  Surgeon: Peter M Martinique, MD;  Location: [facility] ENDOSCOPY;  Service: Cardiovascular;  Laterality: N/A;  . TONSILLECTOMY  1970  . [place]  . WRIST SURGERY Right 2012     Family History  Problem Relation Age of Onset  . Heart disease Father        cardiac arrest   . CAD Father   . Hypertension Father   . CAD Mother        5 stents and numerous bypass surgery  . Hypertension Mother   . Hyperlipidemia Mother   . CAD Unknown   . Breast cancer Maternal Grandmother   . Breast cancer Maternal Aunt       Social History   Socioeconomic History  . Marital status: Widowed    Spouse name: Not on file  . Number of children: 2  . Years of education: 76  . Highest education level: Not on file  Occupational History  . Occupation: Retired  Scientific laboratory technician  .  Financial resource strain: Not on file  . Food insecurity:    Worry: Not on file    Inability: Not on file  . Transportation needs:    Medical: Not on file    Non-medical: Not on file  Tobacco Use  . Smoking status: Never Smoker  . Smokeless tobacco: Never Used  Substance and Sexual Activity  . Alcohol use: No  . Drug use: No  . Sexual activity: Never    Birth control/protection: None  Lifestyle  . Physical activity:    Days per week: Not on file    Minutes per session: Not on file  . Stress: Not on file  Relationships  . Social connections:    Talks on phone: Not on file    Gets together: Not on file    Attends religious service: Not on file    Active member of club or organization: Not on file    Attends meetings of clubs or organizations: Not on file    Relationship status: Not on file  . Intimate partner violence:    Fear of current or ex partner: Not on file    Emotionally abused: Not on file    Physically abused: Not on file    Forced sexual activity: Not on file  Other Topics Concern  . Not on file  Social History Narrative   Patient is a widower (since 59), he currently lives with her son and daughter-in-law. She has 2 children.   She is college educated, and retired from the [employer].   She uses herbal remedies, and multiple over-the-counter supplements. She takes a daily vitamin.   She wears her seatbelt, exercises routinely, smoke detector in the home.   Requires a walker or wheelchair at times.   Feels safe in her relationships.     BP 118/72   Pulse 87   Ht 5\' 9"  (1.753 m)   Wt 141 lb (64 kg)   SpO2 97%   BMI 20.82 kg/m   Physical Exam:  Well appearing NAD HEENT:  Unremarkable Neck:  No JVD, no thyromegally Lymphatics:  No adenopathy Back:  No CVA tenderness Lungs:  Clear with no wheezes HEART:  Regular rate rhythm, no murmurs, no rubs, no clicks Abd:  soft, positive bowel sounds, no organomegally, no rebound, no guarding Ext:  2 plus pulses, no edema, no cyanosis, no clubbing Skin:  No rashes no nodules Neuro:  CN II through XII intact, motor grossly intact   Assess/Plan: 1. PVC's - her symptoms are much improved. I suspect her PVC's will return and we discussed treatment options. She prefers natural remedies for her medical problems if possible. She is not interested in considering any AA drug therapies. 2. PAF - she is asymptomatic, s/p MAZE.  3. Chronic anti-coagulation - her INR's have been therapeutic. She will continue her current meds.   Mikle Bosworth.D.

## 2018-12-20 NOTE — Patient Instructions (Addendum)
Medication Instructions:  Your physician recommends that you continue on your current medications as directed. Please refer to the Current Medication list given to you today.  Labwork: None ordered.  Testing/Procedures: None ordered.  Follow-Up: Your physician wants you to follow-up in: as needed with Dr. Taylor.      Any Other Special Instructions Will Be Listed Below (If Applicable).  If you need a refill on your cardiac medications before your next appointment, please call your pharmacy.   

## 2018-12-23 ENCOUNTER — Ambulatory Visit (INDEPENDENT_AMBULATORY_CARE_PROVIDER_SITE_OTHER): Payer: Medicare Other | Admitting: Pharmacist

## 2018-12-23 DIAGNOSIS — Z8679 Personal history of other diseases of the circulatory system: Secondary | ICD-10-CM

## 2018-12-23 DIAGNOSIS — Z9889 Other specified postprocedural states: Secondary | ICD-10-CM

## 2018-12-23 DIAGNOSIS — Z5181 Encounter for therapeutic drug level monitoring: Secondary | ICD-10-CM | POA: Diagnosis not present

## 2018-12-23 DIAGNOSIS — Z952 Presence of prosthetic heart valve: Secondary | ICD-10-CM

## 2018-12-23 DIAGNOSIS — I509 Heart failure, unspecified: Secondary | ICD-10-CM | POA: Diagnosis not present

## 2018-12-23 LAB — POCT INR: INR: 4.1 — AB (ref 2.0–3.0)

## 2018-12-23 NOTE — Patient Instructions (Signed)
Description   No warfarin today then continue taking 1 tablet everyday EXCEPT 1/2 TABLET ON SUNDAYS.  Recheck in 3 weeks. Call with any new medications or procedures [phone]

## 2018-12-26 ENCOUNTER — Ambulatory Visit: Payer: Medicare Other | Admitting: Neurology

## 2019-01-09 ENCOUNTER — Telehealth: Payer: Self-pay

## 2019-01-09 NOTE — Telephone Encounter (Signed)
Called pt to schedule appt for tomorrow but pt informed me they wanted to cancel due to transportation

## 2019-01-10 ENCOUNTER — Ambulatory Visit: Payer: Medicare Other | Admitting: Sports Medicine

## 2019-01-10 NOTE — Telephone Encounter (Signed)
This encounter was created in error - please disregard.

## 2019-01-12 ENCOUNTER — Ambulatory Visit (INDEPENDENT_AMBULATORY_CARE_PROVIDER_SITE_OTHER): Payer: Medicare Other | Admitting: Pharmacist

## 2019-01-12 ENCOUNTER — Other Ambulatory Visit: Payer: Self-pay

## 2019-01-12 DIAGNOSIS — Z952 Presence of prosthetic heart valve: Secondary | ICD-10-CM

## 2019-01-12 DIAGNOSIS — I509 Heart failure, unspecified: Secondary | ICD-10-CM | POA: Diagnosis not present

## 2019-01-12 DIAGNOSIS — Z8679 Personal history of other diseases of the circulatory system: Secondary | ICD-10-CM

## 2019-01-12 DIAGNOSIS — Z9889 Other specified postprocedural states: Secondary | ICD-10-CM

## 2019-01-12 DIAGNOSIS — Z5181 Encounter for therapeutic drug level monitoring: Secondary | ICD-10-CM

## 2019-01-12 LAB — POCT INR: INR: 4.2 — AB (ref 2.0–3.0)

## 2019-01-18 ENCOUNTER — Telehealth: Payer: Self-pay | Admitting: Cardiology

## 2019-01-18 NOTE — Telephone Encounter (Signed)
Patient c/o Palpitations:  High priority if patient c/o lightheadedness, shortness of breath, or chest pain  1) How long have you had palpitations/irregular HR/ Afib? Are you having the symptoms now? Last week- no, not at this time  2) Are you currently experiencing lightheadedness, SOB or CP? no  3) Do you have a history of afib (atrial fibrillation) or irregular heart rhythm? yes  4) Have you checked your BP or HR? (document readings if available):pulse rate is fine, she does have a blood pressure cuff to take her blood pressure  5) Are you experiencing any other symptoms? No- when she lays down, her heart beats harder

## 2019-01-18 NOTE — Telephone Encounter (Signed)
Returned call to Pt.  Per review of last OV note patient was not interested in taking any  Medications to treat her PVC's.  Per Pt she is now ready to discuss medications with Dr. Lovena Le.  Will do mchart video visit on Friday.

## 2019-01-19 ENCOUNTER — Encounter: Payer: Self-pay | Admitting: Interventional Cardiology

## 2019-01-20 ENCOUNTER — Telehealth (INDEPENDENT_AMBULATORY_CARE_PROVIDER_SITE_OTHER): Payer: Medicare Other | Admitting: Internal Medicine

## 2019-01-20 ENCOUNTER — Other Ambulatory Visit: Payer: Self-pay

## 2019-01-20 DIAGNOSIS — I493 Ventricular premature depolarization: Secondary | ICD-10-CM | POA: Diagnosis not present

## 2019-01-20 DIAGNOSIS — I480 Paroxysmal atrial fibrillation: Secondary | ICD-10-CM

## 2019-01-20 NOTE — Progress Notes (Signed)
Electrophysiology TeleHealth Note   Due to national recommendations of social distancing due to COVID 19, an audio/video telehealth visit is felt to be most appropriate for this patient at this time.  See MyChart message from today for the patient's consent to telehealth for [facility].   Date:  01/20/2019   ID:  [name] [name], DOB [birth year], MRN [id number]  Location: patient's home  Provider location: [address], [place]  Evaluation Performed: Follow-up visit  PCP:  Ma Hillock, DO  Cardiologist:  No primary care provider on file.  Electrophysiologist:  Dr Lovena Le  Chief Complaint:  " I fell the other day"  History of Present Illness:    [name] [name] is a 68 y.o. female who presents via audio/video conferencing for a telehealth visit today. She has a h/o symptomatic PVC's. She has mild LV dysfunction and a prior MV replacement and a MAZE.  Since last being seen in our clinic, the patient reports falling trying to pick up something she dropped. She did not pass out. She has break through palpitations. No chest pain except for some soreness when she fell. She is anxious about running out of her Omega 3 pills. The patient is otherwise without complaint today.  The patient denies symptoms of fevers, chills, cough, or new SOB worrisome for COVID 19.  Past Medical History:  Diagnosis Date  . Anxiety   . Arthritis    knees  . Breast cancer ([place]) 1999  . CHF (congestive heart failure) ([place])    Related to severe mitral regurgitation, April, 2013  . 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 Assess by pulmonary team in the hospital April, 2013  . Ejection fraction    EF 60%, echo, April, 2013, with severe MR before mitral valve replacement  . Herpes   . Hypothyroidism   . IBS (irritable bowel syndrome)   . Mitral valve regurgitation    Mitral valve replacement April, 2013, Mitral valve prolapse  . Multiple sclerosis  ([place])   . Neurogenic bladder   . Osteoporosis   . Ovarian cyst   . Paroxysmal atrial fibrillation (HCC)    Rapid atrial fibrillation in-hospital, Rapid cardioversion,  before mitral valve surgery  . Pulmonary hypertension ([place])    Echo, April, 2013, before mitral valve surgery  . S/P Maze operation for atrial fibrillation 01/26/2012   Complete biatrial lesion set using cryothermy via right mini thoracotomy  . S/P mitral valve replacement 01/26/2012   48mm Sorin [employer]omed[employer] Optiform mechanical prosthesis via right mini thoracotomy  . Warfarin anticoagulation    Mechanical mitral prosthesis, April, 20136    Past Surgical History:  Procedure Laterality Date  . BREAST LUMPECTOMY Right 1999   with sent.node, and axillary dissection (20)  . CHEST TUBE INSERTION  01/26/2012   Procedure: CHEST TUBE INSERTION;  Surgeon: Rexene Alberts, MD;  Location: [place];  Service: Open Heart Surgery;  Laterality: Left;  . COLONOSCOPY  2010   "normal"  . CYSTOSCOPY  1992  . LAPAROSCOPIC OVARIAN CYSTECTOMY  1978   urethral stricture repair  . LEFT AND RIGHT HEART CATHETERIZATION WITH CORONARY ANGIOGRAM N/A 01/20/2012   Procedure: LEFT AND RIGHT HEART CATHETERIZATION WITH CORONARY ANGIOGRAM;  Surgeon: Burnell Blanks, MD;  Location: [facility] CATH LAB;  Service: Cardiovascular;  Laterality: N/A;  . LYMPHADENECTOMY    . MAZE  01/26/2012   Procedure: MAZE;  Surgeon: Rexene Alberts, MD;  Location: [place];  Service: Open Heart Surgery;  Laterality: N/A;  . MITRAL VALVE REPLACEMENT  01/26/2012   Procedure: MINIMALLY INVASIVE MITRAL VALVE (MV) REPLACEMENT;  Surgeon: Rexene Alberts, MD;  Location: [place];  Service: Open Heart Surgery;  Laterality: Right;  . TEE WITHOUT CARDIOVERSION  01/19/2012   Procedure: TRANSESOPHAGEAL ECHOCARDIOGRAM (TEE);  Surgeon: Peter M Martinique, MD;  Location[facility] ENDOSCOPY;  Service: Cardiovascular;  Laterality: N/A;  . TONSILLECTOMY  1970  . [place]  . WRIST SURGERY Right 2012     Current Outpatient Medications  Medication Sig Dispense Refill  . amoxicillin (AMOXIL) 500 MG capsule take 4 capsules by mouth 1 hour prior to dental appointment  0  . Biotin 5000 MCG CAPS Take 20,000 mcg by mouth.     . Black Cohosh 40 MG CAPS Take 40 mg by mouth daily.    . Bromelains (BROMELAIN PO) Take 1 capsule by mouth 2 (two) times daily.    . calcium carbonate (OSCAL) 1500 (600 Ca) MG TABS tablet Takes 1 time daily    . Cholecalciferol (VITAMIN D3) 2000 units capsule Take 6,000 Units by mouth daily.     . Coenzyme Q10 (CO Q-10) 100 MG CAPS Take 1 capsule by mouth daily.     . Cranberry 500 MG CAPS Take 1 capsule by mouth daily.    Marland Kitchen L-THEANINE PO Take 1 capsule by mouth daily.     Marland Kitchen lactose free nutrition (BOOST PLUS) LIQD Take 237 mLs by mouth daily.     Marland Kitchen lidocaine-prilocaine (EMLA) cream Apply 1 application topically as needed (per pt this is for use before blood draws).   0  . lisinopril (PRINIVIL,ZESTRIL) 5 MG tablet TAKE 1 TABLET BY MOUTH ONCE DAILY 90 tablet 3  . Lysine 500 MG CAPS Take 1 capsule by mouth daily.    . Magnesium Citrate 200 MG TABS Take 200 mg by mouth daily as needed.    . metoprolol tartrate (LOPRESSOR) 25 MG tablet Take 1 tablet (25 mg total) by mouth 2 (two) times daily. 180 tablet 2  . MILK THISTLE PO Take 1 capsule by mouth 2 (two) times daily.    . Misc Natural Products (GLUCOSAMINE CHOND COMPLEX/MSM PO) Strength of dose Glucosamine 1500mg  /Chodroitron 1000mg / MSM 500mg  takes one twice daily    . NON FORMULARY 1 capsule 2 (two) times daily.    Jonna Coup Leaf 500 MG CAPS Take 1 capsule by mouth 2 (two) times daily.     Marland Kitchen OVER THE COUNTER MEDICATION Red marine algae - 1 capsules daily    . Petasin (PETADOLEX PO) Take by mouth 2 (two) times daily.    . Probiotic Product (PROBIOTIC DAILY PO) Take 1 capsule once a day    . PROGESTERONE MICRONIZED PO Take 100 mg by mouth daily.     . Pumpkin Seed (URIPLEX) 500 MG TABS Take 1 tablet by mouth 2 (two) times  daily.    Marland Kitchen thyroid (ARMOUR) 90 MG tablet Take 90 mg by mouth daily.    . TURMERIC PO Take by mouth.    . vitamin A 10000 UNIT capsule Take 10,000 Units by mouth daily.    . vitamin E 400 UNIT capsule Take 400 Units by mouth daily.    Marland Kitchen warfarin (COUMADIN) 5 MG tablet TAKE 1 TABLET BY MOUTH AS DIRECTED BY COUMADIN CLINIC FOR 90 DAYS 105 tablet 0   No current facility-administered medications for this visit.     Allergies:   Erythromycin  Social History:  The patient  reports that she has never smoked. She has never used smokeless tobacco. She reports that she does not drink alcohol or use drugs.   Family History:  The patient's family history includes Breast cancer in her maternal aunt and maternal grandmother; CAD in her father, mother, and another family member; Heart disease in her father; Hyperlipidemia in her mother; Hypertension in her father and mother.   ROS:  Please see the history of present illness.   All other systems are personally reviewed and negative.    Exam:    Vital Signs:  There were no vitals taken for this visit.  Well appearing, alert and conversant, regular work of breathing,  good skin color Eyes- anicteric, neuro- grossly intact, skin- no apparent rash or lesions or cyanosis, mouth- oral mucosa is pink   Labs/Other Tests and Data Reviewed:    Recent Labs: 08/02/2018: ALT 13; BUN 30; Creatinine 0.8; Hemoglobin 14.2; Platelets 282; TSH 0.82   Wt Readings from Last 3 Encounters:  12/20/18 141 lb (64 kg)  12/19/18 142 lb (64.4 kg)  11/01/18 142 lb 12.8 oz (64.8 kg)     Other studies personally reviewed:    ASSESSMENT & PLAN:    1.  PVC's - she has had some break throughs and I have recommended we have her take an extra metoprolol as needed. 2. LV dysfunction - she has not tried to avoid eating salty foods. I asked her to do so. 3. Atrial fib - she is not having any symptomatic episodes. We will follow. 4. COVID 19 screen The patient denies  symptoms of COVID 19 at this time.  The importance of social distancing was discussed today.  Follow-up:  6 months Next remote: n/a  Current medicines are reviewed at length with the patient today.   The patient does not have concerns regarding her medicines.  The following changes were made today:  none  Labs/ tests ordered today include:  No orders of the defined types were placed in this encounter.    Patient Risk:  after full review of this patients clinical status, I feel that they are at moderate risk at this time.  Today, I have spent 22 minutes with the patient with telehealth technology discussing the above problems .    Signed, Cristopher Peru, MD  01/20/2019 10:25 AM     [place] [address] [phone] (office) [phone] (fax)

## 2019-01-25 ENCOUNTER — Telehealth: Payer: Self-pay

## 2019-01-25 ENCOUNTER — Telehealth: Payer: Self-pay | Admitting: *Deleted

## 2019-01-25 NOTE — Telephone Encounter (Signed)

## 2019-01-25 NOTE — Telephone Encounter (Signed)
lmom for prescreen/drive thru 

## 2019-01-26 ENCOUNTER — Other Ambulatory Visit: Payer: Self-pay

## 2019-01-26 ENCOUNTER — Ambulatory Visit (INDEPENDENT_AMBULATORY_CARE_PROVIDER_SITE_OTHER): Payer: Medicare Other | Admitting: Pharmacist

## 2019-01-26 DIAGNOSIS — Z5181 Encounter for therapeutic drug level monitoring: Secondary | ICD-10-CM | POA: Diagnosis not present

## 2019-01-26 DIAGNOSIS — G35 Multiple sclerosis: Secondary | ICD-10-CM

## 2019-01-26 DIAGNOSIS — Z79899 Other long term (current) drug therapy: Secondary | ICD-10-CM | POA: Diagnosis not present

## 2019-01-26 DIAGNOSIS — E559 Vitamin D deficiency, unspecified: Secondary | ICD-10-CM

## 2019-01-26 DIAGNOSIS — Z952 Presence of prosthetic heart valve: Secondary | ICD-10-CM

## 2019-01-26 DIAGNOSIS — Z7901 Long term (current) use of anticoagulants: Secondary | ICD-10-CM | POA: Diagnosis not present

## 2019-01-26 DIAGNOSIS — Z8679 Personal history of other diseases of the circulatory system: Secondary | ICD-10-CM

## 2019-01-26 DIAGNOSIS — I509 Heart failure, unspecified: Secondary | ICD-10-CM

## 2019-01-26 DIAGNOSIS — Z9889 Other specified postprocedural states: Secondary | ICD-10-CM

## 2019-01-26 LAB — POCT INR: INR: 3.3 — AB (ref 2.0–3.0)

## 2019-01-26 NOTE — Patient Instructions (Addendum)
Description   Spoke with pt and instructed pt to continue taking 1 tablet everyday except 1/2 tablet on Sundays and Thursdays.  Recheck in 5 weeks. Call with any new medications or procedures [phone]

## 2019-02-13 ENCOUNTER — Other Ambulatory Visit: Payer: Self-pay | Admitting: Cardiology

## 2019-02-14 ENCOUNTER — Encounter: Payer: Self-pay | Admitting: Family Medicine

## 2019-02-14 DIAGNOSIS — E039 Hypothyroidism, unspecified: Secondary | ICD-10-CM | POA: Diagnosis not present

## 2019-02-14 DIAGNOSIS — N951 Menopausal and female climacteric states: Secondary | ICD-10-CM | POA: Diagnosis not present

## 2019-02-14 DIAGNOSIS — G35 Multiple sclerosis: Secondary | ICD-10-CM | POA: Diagnosis not present

## 2019-02-14 DIAGNOSIS — E559 Vitamin D deficiency, unspecified: Secondary | ICD-10-CM | POA: Diagnosis not present

## 2019-02-14 DIAGNOSIS — R7989 Other specified abnormal findings of blood chemistry: Secondary | ICD-10-CM | POA: Diagnosis not present

## 2019-02-14 LAB — VITAMIN D 25 HYDROXY (VIT D DEFICIENCY, FRACTURES): Vit D, 25-Hydroxy: 74

## 2019-02-14 LAB — LIPID PANEL
HDL: 62 (ref 35–70)
LDL Cholesterol: 160
Triglycerides: 62 (ref 40–160)

## 2019-02-14 LAB — HEPATIC FUNCTION PANEL
ALT: 11 (ref 7–35)
AST: 17 (ref 13–35)

## 2019-02-14 LAB — BASIC METABOLIC PANEL
BUN: 26 — AB (ref 4–21)
Creatinine: 0.8 (ref 0.5–1.1)
Potassium: 3.8 (ref 3.4–5.3)
Sodium: 140 (ref 137–147)

## 2019-02-14 LAB — CBC AND DIFFERENTIAL
HCT: 42 (ref 36–46)
Hemoglobin: 14.2 (ref 12.0–16.0)
Platelets: 346 (ref 150–399)

## 2019-02-14 LAB — TSH: TSH: 1.24 (ref 0.41–5.90)

## 2019-02-14 LAB — HEMOGLOBIN A1C: Hemoglobin A1C: 5.4

## 2019-02-20 DIAGNOSIS — G35 Multiple sclerosis: Secondary | ICD-10-CM | POA: Diagnosis not present

## 2019-03-01 ENCOUNTER — Encounter: Payer: Self-pay | Admitting: Gastroenterology

## 2019-03-01 ENCOUNTER — Telehealth: Payer: Self-pay

## 2019-03-01 NOTE — Telephone Encounter (Signed)

## 2019-03-02 ENCOUNTER — Other Ambulatory Visit: Payer: Self-pay

## 2019-03-02 ENCOUNTER — Ambulatory Visit (INDEPENDENT_AMBULATORY_CARE_PROVIDER_SITE_OTHER): Payer: Medicare Other | Admitting: *Deleted

## 2019-03-02 DIAGNOSIS — Z5181 Encounter for therapeutic drug level monitoring: Secondary | ICD-10-CM | POA: Diagnosis not present

## 2019-03-02 DIAGNOSIS — I509 Heart failure, unspecified: Secondary | ICD-10-CM

## 2019-03-02 DIAGNOSIS — Z8679 Personal history of other diseases of the circulatory system: Secondary | ICD-10-CM

## 2019-03-02 DIAGNOSIS — Z952 Presence of prosthetic heart valve: Secondary | ICD-10-CM

## 2019-03-02 DIAGNOSIS — Z9889 Other specified postprocedural states: Secondary | ICD-10-CM

## 2019-03-02 LAB — POCT INR: INR: 2.5 (ref 2.0–3.0)

## 2019-03-02 NOTE — Patient Instructions (Addendum)
Description   Spoke with pt and instructed pt to take 1 tablet today then continue taking 1 tablet everyday except 1/2 tablet on Sundays and Thursdays.  Recheck in 5 weeks. Call with any new medications or procedures [phone]

## 2019-03-07 ENCOUNTER — Ambulatory Visit: Payer: Medicare Other | Admitting: Sports Medicine

## 2019-03-21 ENCOUNTER — Ambulatory Visit: Payer: Medicare Other

## 2019-03-28 ENCOUNTER — Ambulatory Visit (INDEPENDENT_AMBULATORY_CARE_PROVIDER_SITE_OTHER): Payer: Medicare Other | Admitting: Family Medicine

## 2019-03-28 ENCOUNTER — Other Ambulatory Visit: Payer: Self-pay

## 2019-03-28 ENCOUNTER — Encounter: Payer: Self-pay | Admitting: Family Medicine

## 2019-03-28 ENCOUNTER — Ambulatory Visit: Payer: Medicare Other

## 2019-03-28 VITALS — HR 79 | Ht 69.0 in | Wt 134.0 lb

## 2019-03-28 DIAGNOSIS — Z0000 Encounter for general adult medical examination without abnormal findings: Secondary | ICD-10-CM | POA: Diagnosis not present

## 2019-03-28 DIAGNOSIS — R2681 Unsteadiness on feet: Secondary | ICD-10-CM

## 2019-03-28 DIAGNOSIS — E2839 Other primary ovarian failure: Secondary | ICD-10-CM | POA: Diagnosis not present

## 2019-03-28 DIAGNOSIS — G35 Multiple sclerosis: Secondary | ICD-10-CM | POA: Diagnosis not present

## 2019-03-28 DIAGNOSIS — Z1211 Encounter for screening for malignant neoplasm of colon: Secondary | ICD-10-CM | POA: Diagnosis not present

## 2019-03-28 NOTE — Progress Notes (Signed)
VIRTUAL VISIT VIA VIDEO  I connected with [name] [name] on 03/28/19 at  1:30 PM EDT by a video enabled telemedicine application and verified that I am speaking with the correct person using two identifiers. Location patient: Home Location provider: [facility], Office Persons participating in the virtual visit: Patient, Dr. Raoul Pitch and R.Baker, LPN  I discussed the limitations of evaluation and management by telemedicine and the availability of in person appointments. The patient expressed understanding and agreed to proceed.   Medicare AWV -subsequent Chief Complaint  Patient presents with  . Medicare Wellness    Pt is still unable to walk with assistance, pt using cane outside of house, walker inside home. Pt using home exercise program that seems to be helping but would like to know if she should do PT again. Pt had MS dx.    Patient Care Team    Relationship Specialty Notifications Start End  Ma Hillock, DO PCP - General Family Medicine  07/31/16   Elsie Stain, MD Attending Physician Pulmonary Disease Abnormal results only, Admissions 03/03/12   Wallene Huh, DPM Consulting Physician Podiatry  08/11/16   Dorothy Spark, MD Consulting Physician Cardiology  08/11/16   Mingo Amber, MD  Alternative Medicine  08/11/16   Ditty, Kevan Ny, MD Consulting Physician Neurosurgery  08/11/16   Princess Bruins, MD Consulting Physician Obstetrics and Gynecology  09/18/16   Vinnie Level  Dentistry  09/18/16   Druscilla Brownie, MD Consulting Physician Dermatology  09/18/16   Pieter Partridge, DO Consulting Physician Neurology  12/19/18      History of Present Ilness: [name] [name], 68 y.o. , female presents today for Medicare wellness visit.   Patient reports having some gait instability secondary to her multiple sclerosis.  She has had 1 fall this year.  She has been attempting exercise at home and feels it is helped, but is wondering if restarting  physical therapy will help strengthen her gait.  She has performed physical therapy at [facility] health neuro rehab down [address] and would like to have her session scheduled there again.  Health maintenance:  Colonoscopy: 02/22/2009-was completed by Dr. Maurene Capes.  10-year follow-up.  She is due. Mammogram: 03/08/2018-normal, [facility] on [address]. [place] [facility] and Mat aunt.  Cervical cancer screening: follow with GYN.  Immunizations: td UTD 2012, declined flu shots, declined PNA and shingrix vaccines.  Infectious disease screening: Hep C completed 2015 DEXA: Due - ordered at same time as august mammogram Glaucoma screen:established with Dr. Bing Plume- [facility] office.  Hearing: Whisper test, no barriers identified.   Cognitive assessment:  Word recall (daisy, blue, church): 3/3  Pt has a healthcare power of attorney.   Past medical, surgical, family and social histories reviewed (including experiences with illnesses, hospital stays, operations, injuries, and treatments):  Past Medical History:  Diagnosis Date  . Anxiety   . Arthritis    knees  . Breast cancer ([place]) 1999  . CHF (congestive heart failure) ([place])    Related to severe mitral regurgitation, April, 2013  . 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 Assess by pulmonary team in the hospital April, 2013  . Ejection fraction    EF 60%, echo, April, 2013, with severe MR before mitral valve replacement  . Herpes   . Hypothyroidism   . IBS (irritable bowel syndrome)   . Mitral valve regurgitation    Mitral valve replacement April, 2013, Mitral valve prolapse  .  Multiple sclerosis ([place])   . Neurogenic bladder   . Osteoporosis   . Ovarian cyst   . Paroxysmal atrial fibrillation (HCC)    Rapid atrial fibrillation in-hospital, Rapid cardioversion,  before mitral valve surgery  . Pulmonary hypertension ([place])    Echo, April, 2013, before mitral valve surgery  . S/P Maze operation for atrial fibrillation  01/26/2012   Complete biatrial lesion set using cryothermy via right mini thoracotomy  . S/P mitral valve replacement 01/26/2012   35mm Sorin [employer]omedics Optiform mechanical prosthesis via right mini thoracotomy  . Warfarin anticoagulation    Mechanical mitral prosthesis, April, 20136   All allergies reviewed Allergies  Allergen Reactions  . Erythromycin Nausea And Vomiting   Past Surgical History:  Procedure Laterality Date  . BREAST LUMPECTOMY Right 1999   with sent.node, and axillary dissection (20)  . CHEST TUBE INSERTION  01/26/2012   Procedure: CHEST TUBE INSERTION;  Surgeon: Rexene Alberts, MD;  Location: [place];  Service: Open Heart Surgery;  Laterality: Left;  . COLONOSCOPY  2010   "normal"  . CYSTOSCOPY  1992  . LAPAROSCOPIC OVARIAN CYSTECTOMY  1978   urethral stricture repair  . LEFT AND RIGHT HEART CATHETERIZATION WITH CORONARY ANGIOGRAM N/A 01/20/2012   Procedure: LEFT AND RIGHT HEART CATHETERIZATION WITH CORONARY ANGIOGRAM;  Surgeon: Burnell Blanks, MD;  Location: [facility] CATH LAB;  Service: Cardiovascular;  Laterality: N/A;  . LYMPHADENECTOMY    . MAZE  01/26/2012   Procedure: MAZE;  Surgeon: Rexene Alberts, MD;  Location: [place];  Service: Open Heart Surgery;  Laterality: N/A;  . MITRAL VALVE REPLACEMENT  01/26/2012   Procedure: MINIMALLY INVASIVE MITRAL VALVE (MV) REPLACEMENT;  Surgeon: Rexene Alberts, MD;  Location: [place];  Service: Open Heart Surgery;  Laterality: Right;  . TEE WITHOUT CARDIOVERSION  01/19/2012   Procedure: TRANSESOPHAGEAL ECHOCARDIOGRAM (TEE);  Surgeon: Peter M Martinique, MD;  Location: [facility] ENDOSCOPY;  Service: Cardiovascular;  Laterality: N/A;  . TONSILLECTOMY  1970  . [place]  . WRIST SURGERY Right 2012   Family History  Problem Relation Age of Onset  . Heart disease Father        cardiac arrest   . CAD Father   . Hypertension Father   . CAD Mother        5 stents and numerous bypass surgery  . Hypertension Mother   .  Hyperlipidemia Mother   . CAD Other   . Breast cancer Maternal Grandmother   . Breast cancer Maternal Aunt    Social History   Social History Narrative   Patient is a widower (since 2000), he currently lives with her son and daughter-in-law. She has 2 children.   She is college educated, and retired from the [employer].   She uses herbal remedies, and multiple over-the-counter supplements. She takes a daily vitamin.   She wears her seatbelt, exercises routinely, smoke detector in the home.   Requires a walker or wheelchair at times.   Feels safe in her relationships.    All medications verified Allergies as of 03/28/2019      Reactions   Erythromycin Nausea And Vomiting      Medication List       Accurate as of March 28, 2019  1:37 PM. If you have any questions, ask your nurse or doctor.        amoxicillin 500 MG capsule Commonly known as:  AMOXIL take 4 capsules by mouth 1 hour prior to  dental appointment   Biotin 5000 MCG Caps Take 20,000 mcg by mouth.   Black Cohosh 40 MG Caps Take 40 mg by mouth daily.   BROMELAIN PO Take 1 capsule by mouth 2 (two) times daily.   calcium carbonate 1500 (600 Ca) MG Tabs tablet Commonly known as:  OSCAL Takes 1 time daily   Co Q-10 100 MG Caps Take 1 capsule by mouth daily.   Cranberry 500 MG Caps Take 1 capsule by mouth daily.   GLUCOSAMINE CHOND COMPLEX/MSM PO Strength of dose Glucosamine 1500mg  /Chodroitron 1000mg / MSM 500mg  takes one twice daily   L-THEANINE PO Take 1 capsule by mouth daily.   lactose free nutrition Liqd Take 237 mLs by mouth daily.   lidocaine-prilocaine cream Commonly known as:  EMLA Apply 1 application topically as needed (per pt this is for use before blood draws).   lisinopril 5 MG tablet Commonly known as:  ZESTRIL TAKE 1 TABLET BY MOUTH ONCE DAILY   Lysine 500 MG Caps Take 1 capsule by mouth daily.   Magnesium Citrate 200 MG Tabs Take 200 mg by mouth daily as needed.   metoprolol  tartrate 25 MG tablet Commonly known as:  LOPRESSOR Take 1 tablet (25 mg total) by mouth 2 (two) times daily.   MILK THISTLE PO Take 1 capsule by mouth 2 (two) times daily.   NON FORMULARY 1 capsule 2 (two) times daily.   Olive Leaf 500 MG Caps Take 1 capsule by mouth 2 (two) times daily.   OVER THE COUNTER MEDICATION Red marine algae - 1 capsules daily   PETADOLEX PO Take by mouth 2 (two) times daily.   PROBIOTIC DAILY PO Take 1 capsule once a day   PROGESTERONE MICRONIZED PO Take 100 mg by mouth daily.   thyroid 90 MG tablet Commonly known as:  ARMOUR Take 90 mg by mouth daily.   TURMERIC PO Take by mouth.   Uriplex 500 MG Tabs Generic drug:  Pumpkin Seed Take 1 tablet by mouth 2 (two) times daily.   vitamin A 10000 UNIT capsule Take 10,000 Units by mouth daily.   Vitamin D3 50 MCG (2000 UT) capsule Take 6,000 Units by mouth daily.   vitamin E 400 UNIT capsule Take 400 Units by mouth daily.   warfarin 5 MG tablet Commonly known as:  COUMADIN Take as directed by the anticoagulation clinic. If you are unsure how to take this medication, talk to your nurse or doctor. Original instructions:  TAKE 1 TABLET BY MOUTH AS DIRECTED BY COUMADIN CLINIC FOR 90 DAYS       Depression screen [facility] 2/9 03/28/2019 03/15/2018 09/18/2016 08/11/2016  Decreased Interest 0 0 0 0  Down, Depressed, Hopeless 0 0 0 0  PHQ - 2 Score 0 0 0 0  Some recent data might be hidden   GAD 7 : Generalized Anxiety Score 03/28/2019  Nervous, Anxious, on Edge 0  Control/stop worrying 0  Worry too much - different things 1  Trouble relaxing 0  Restless 0  Easily annoyed or irritable 0  Afraid - awful might happen 0  Total GAD 7 Score 1  Anxiety Difficulty Not difficult at all   Immunizations: Immunization History  Administered Date(s) Administered  . Td 10/21/2003  . Tdap 02/17/2011   Exercise: Diet: Regular Functional Status Survey: Get up and go test:  - uses cane or walker.  Is the  patient deaf or have difficulty hearing?: No Does the patient have difficulty seeing, even when wearing glasses/contacts?:  No Does the patient have difficulty concentrating, remembering, or making decisions?: No Does the patient have difficulty walking or climbing stairs?: Yes Does the patient have difficulty dressing or bathing?: No Does the patient have difficulty doing errands alone such as visiting a doctor's office or shopping?: No Current Exercise Habits: Home exercise routine, Type of exercise: walking;Other - see comments;stretching(Online exercise program ), Time (Minutes): 30, Frequency (Times/Week): 4, Weekly Exercise (Minutes/Week): 120, Intensity: Mild Exercise limited by: orthopedic condition(s);Other - see comments(MS dx) Fall Risk  03/28/2019 03/15/2018 06/24/2017 01/11/2017 09/18/2016  Falls in the past year? 1 No No Yes -  Comment - - - - -  Number falls in past yr: 0 - - 2 or more 2 or more  Comment - - - - -  Injury with Fall? 0 - - Yes Yes  Risk Factor Category  - - - - High Fall Risk  Risk for fall due to : Impaired balance/gait;History of fall(s);Impaired mobility;Other (Comment) - - History of fall(s) History of fall(s);Impaired balance/gait  Risk for fall due to: Comment MS dx  - - - -  Follow up Falls evaluation completed;Education provided;Falls prevention discussed - - - Falls prevention discussed    Cardiovascular Screening Blood Tests Lipid Panel     Component Value Date/Time   CHOL 234 (A) 08/02/2018   TRIG 75 08/02/2018   HDL 72 (A) 08/02/2018   CHOLHDL 4 10/29/2016 1006   VLDL 31.2 10/29/2016 1006   LDLCALC 144 08/02/2018    Diabetes Screening Tests Lab Results  Component Value Date   HGBA1C 5.4 08/02/2018    Assessment and Plan: Medicare annual wellness visit, subsequent Patient was encouraged to exercise greater than 150 minutes a week. Patient was encouraged to choose a diet filled with fresh fruits and vegetables, and lean meats. AVS provided to  patient today for education/recommendation on gender specific health and safety maintenance. Patient declines pneumonia vaccines, and influenza vaccines and Shingrix vaccine.  Gait instability/Multiple sclerosis ([place]) - Ambulatory referral to Physical Therapy>> gait strengthening.   Estrogen deficiency - schedule with already scheduled mammogram in august at [facility]; Future  Colon cancer screening - Cologuard   1 year Medicare wellness follow-up.  Electronically Signed by: Howard Pouch, DO [place]

## 2019-03-28 NOTE — Patient Instructions (Addendum)
[name] [name] , Thank you for taking time to come for your Medicare Wellness Visit. I appreciate your ongoing commitment to your health goals. Please review the following plan we discussed and let me know if I can assist you in the future.     These are the goals we discussed:   Goals     <enter goal here> (pt-stated)     "to be able to walk again"      You have your mammogram scheduled.  We have ordered the bone density to be scheduled hopefully on the same day as your mammogram. We  have ordered the Cologuard for you for your colon cancer screening. We have ordered physical therapy to start to help you with gait strengthening. Please remember to make your eye appointment and her gynecology appointment.  Health Maintenance After Age 69 After age 27, you are at a higher risk for certain long-term diseases and infections as well as injuries from falls. Falls are a major cause of broken bones and head injuries in people who are older than age 97. Getting regular preventive care can help to keep you healthy and well. Preventive care includes getting regular testing and making lifestyle changes as recommended by your health care provider. Talk with your health care provider about:  Which screenings and tests you should have. A screening is a test that checks for a disease when you have no symptoms.  A diet and exercise plan that is right for you. What should I know about screenings and tests to prevent falls? Screening and testing are the best ways to find a health problem early. Early diagnosis and treatment give you the best chance of managing medical conditions that are common after age 76. Certain conditions and lifestyle choices may make you more likely to have a fall. Your health care provider may recommend:  Regular vision checks. Poor vision and conditions such as cataracts can make you more likely to have a fall. If you wear glasses, make sure to get your prescription updated if your  vision changes.  Medicine review. Work with your health care provider to regularly review all of the medicines you are taking, including over-the-counter medicines. Ask your health care provider about any side effects that may make you more likely to have a fall. Tell your health care provider if any medicines that you take make you feel dizzy or sleepy.  Osteoporosis screening. Osteoporosis is a condition that causes the bones to get weaker. This can make the bones weak and cause them to break more easily.  Blood pressure screening. Blood pressure changes and medicines to control blood pressure can make you feel dizzy.  Strength and balance checks. Your health care provider may recommend certain tests to check your strength and balance while standing, walking, or changing positions.  Foot health exam. Foot pain and numbness, as well as not wearing proper footwear, can make you more likely to have a fall.  Depression screening. You may be more likely to have a fall if you have a fear of falling, feel emotionally low, or feel unable to do activities that you used to do.  Alcohol use screening. Using too much alcohol can affect your balance and may make you more likely to have a fall. What actions can I take to lower my risk of falls? General instructions  Talk with your health care provider about your risks for falling. Tell your health care provider if: ? You fall. Be sure to tell your  health care provider about all falls, even ones that seem minor. ? You feel dizzy, sleepy, or off-balance.  Take over-the-counter and prescription medicines only as told by your health care provider. These include any supplements.  Eat a healthy diet and maintain a healthy weight. A healthy diet includes low-fat dairy products, low-fat (lean) meats, and fiber from whole grains, beans, and lots of fruits and vegetables. Home safety  Remove any tripping hazards, such as rugs, cords, and clutter.  Install  safety equipment such as grab bars in bathrooms and safety rails on stairs.  Keep rooms and walkways well-lit. Activity   Follow a regular exercise program to stay fit. This will help you maintain your balance. Ask your health care provider what types of exercise are appropriate for you.  If you need a cane or walker, use it as recommended by your health care provider.  Wear supportive shoes that have nonskid soles. Lifestyle  Do not drink alcohol if your health care provider tells you not to drink.  If you drink alcohol, limit how much you have: ? 0-1 drink a day for women. ? 0-2 drinks a day for men.  Be aware of how much alcohol is in your drink. In the [place] one drink equals one typical bottle of beer (12 oz), one-half glass of wine (5 oz), or one shot of hard liquor (1 oz).  Do not use any products that contain nicotine or tobacco, such as cigarettes and e-cigarettes. If you need help quitting, ask your health care provider. Summary  Having a healthy lifestyle and getting preventive care can help to protect your health and wellness after age 63.  Screening and testing are the best way to find a health problem early and help you avoid having a fall. Early diagnosis and treatment give you the best chance for managing medical conditions that are more common for people who are older than age 56.  Falls are a major cause of broken bones and head injuries in people who are older than age 56. Take precautions to prevent a fall at home.  Work with your health care provider to learn what changes you can make to improve your health and wellness and to prevent falls. This information is not intended to replace advice given to you by your health care provider. Make sure you discuss any questions you have with your health care provider. Document Released: 08/18/2017 Document Revised: 08/18/2017 Document Reviewed: 08/18/2017 Elsevier Interactive Patient Education  2019 [employer].

## 2019-03-29 ENCOUNTER — Telehealth: Payer: Self-pay

## 2019-03-29 NOTE — Telephone Encounter (Signed)

## 2019-04-04 ENCOUNTER — Ambulatory Visit (INDEPENDENT_AMBULATORY_CARE_PROVIDER_SITE_OTHER): Payer: Medicare Other | Admitting: *Deleted

## 2019-04-04 ENCOUNTER — Other Ambulatory Visit: Payer: Self-pay

## 2019-04-04 DIAGNOSIS — Z952 Presence of prosthetic heart valve: Secondary | ICD-10-CM

## 2019-04-04 DIAGNOSIS — Z9889 Other specified postprocedural states: Secondary | ICD-10-CM | POA: Diagnosis not present

## 2019-04-04 DIAGNOSIS — Z8679 Personal history of other diseases of the circulatory system: Secondary | ICD-10-CM

## 2019-04-04 DIAGNOSIS — I509 Heart failure, unspecified: Secondary | ICD-10-CM

## 2019-04-04 DIAGNOSIS — Z5181 Encounter for therapeutic drug level monitoring: Secondary | ICD-10-CM

## 2019-04-04 LAB — POCT INR: INR: 1.8 — AB (ref 2.0–3.0)

## 2019-04-04 NOTE — Patient Instructions (Signed)
Description   Today take 1.5 tablets and tomorrow take 1.5 tablets then continue taking 1 tablet everyday except 1/2 tablet on Sundays and Thursdays.  Recheck in 2 weeks. Call with any new medications or procedures [phone]

## 2019-04-07 ENCOUNTER — Other Ambulatory Visit: Payer: Self-pay | Admitting: Physician Assistant

## 2019-04-10 ENCOUNTER — Other Ambulatory Visit: Payer: Self-pay

## 2019-04-12 ENCOUNTER — Telehealth: Payer: Self-pay

## 2019-04-12 NOTE — Telephone Encounter (Signed)

## 2019-04-12 NOTE — Telephone Encounter (Signed)
lmom for prescreen  

## 2019-04-19 ENCOUNTER — Other Ambulatory Visit: Payer: Self-pay

## 2019-04-19 ENCOUNTER — Ambulatory Visit (INDEPENDENT_AMBULATORY_CARE_PROVIDER_SITE_OTHER): Payer: Medicare Other | Admitting: *Deleted

## 2019-04-19 DIAGNOSIS — Z9889 Other specified postprocedural states: Secondary | ICD-10-CM

## 2019-04-19 DIAGNOSIS — I509 Heart failure, unspecified: Secondary | ICD-10-CM | POA: Diagnosis not present

## 2019-04-19 DIAGNOSIS — Z5181 Encounter for therapeutic drug level monitoring: Secondary | ICD-10-CM | POA: Diagnosis not present

## 2019-04-19 DIAGNOSIS — Z952 Presence of prosthetic heart valve: Secondary | ICD-10-CM | POA: Diagnosis not present

## 2019-04-19 DIAGNOSIS — Z8679 Personal history of other diseases of the circulatory system: Secondary | ICD-10-CM | POA: Diagnosis not present

## 2019-04-19 LAB — POCT INR: INR: 2.4 (ref 2.0–3.0)

## 2019-04-19 NOTE — Patient Instructions (Addendum)
Description   Today take 1.5 tablets then start taking 1 tablet everyday except 1/2 tablet on Thursdays.  Recheck in 3 weeks. Call with any new medications or procedures [phone]

## 2019-05-02 ENCOUNTER — Ambulatory Visit: Payer: Medicare Other | Admitting: Sports Medicine

## 2019-05-05 ENCOUNTER — Telehealth: Payer: Self-pay | Admitting: Cardiology

## 2019-05-05 NOTE — Telephone Encounter (Signed)
[facility] pharmacy stating that pt's medication Lisinopril 5 mg tablet is on backorder and would like to know if Dr. Meda Coffee would like to prescribe Lisinopril 2.5 mg tablet taking BID? Please address

## 2019-05-08 ENCOUNTER — Telehealth: Payer: Self-pay

## 2019-05-08 MED ORDER — LISINOPRIL 2.5 MG PO TABS: 5.0000 mg | ORAL_TABLET | Freq: Every day | ORAL | 1 refills | Status: DC

## 2019-05-08 NOTE — Telephone Encounter (Signed)
Switched the pts lisinopril to the 2.5 mg tablets with instructions to take 2 tabs (5 mg total) by mouth daily.  This was sent into the pts confirmed pharmacy of choice.  Please inform the pt.

## 2019-05-08 NOTE — Telephone Encounter (Signed)

## 2019-05-08 NOTE — Telephone Encounter (Signed)
Called pt to inform pt per [name] Olive, LPN, that pt's lisinopril 5 mg tablet was switched to Lisinopril 2.5 mg tablet taken 2 tablets daily, because medication Lisinopril 5 mg tablets were on backorder. I advised pt that if she has any other problems, questions or concerns to call the office. Pt verbalized understanding.

## 2019-05-10 ENCOUNTER — Ambulatory Visit (INDEPENDENT_AMBULATORY_CARE_PROVIDER_SITE_OTHER): Payer: Medicare Other | Admitting: *Deleted

## 2019-05-10 ENCOUNTER — Other Ambulatory Visit: Payer: Self-pay | Admitting: Cardiology

## 2019-05-10 ENCOUNTER — Other Ambulatory Visit: Payer: Self-pay

## 2019-05-10 DIAGNOSIS — Z952 Presence of prosthetic heart valve: Secondary | ICD-10-CM

## 2019-05-10 DIAGNOSIS — Z9889 Other specified postprocedural states: Secondary | ICD-10-CM

## 2019-05-10 DIAGNOSIS — Z8679 Personal history of other diseases of the circulatory system: Secondary | ICD-10-CM

## 2019-05-10 DIAGNOSIS — I509 Heart failure, unspecified: Secondary | ICD-10-CM

## 2019-05-10 DIAGNOSIS — Z5181 Encounter for therapeutic drug level monitoring: Secondary | ICD-10-CM | POA: Diagnosis not present

## 2019-05-10 LAB — POCT INR: INR: 2.7 (ref 2.0–3.0)

## 2019-05-10 NOTE — Patient Instructions (Signed)
Description   Continue taking 1 tablet everyday except 1/2 tablet on Thursdays.  Recheck in 4 weeks. Call with any new medications or procedures [phone]

## 2019-06-08 ENCOUNTER — Other Ambulatory Visit: Payer: Self-pay

## 2019-06-08 ENCOUNTER — Ambulatory Visit (INDEPENDENT_AMBULATORY_CARE_PROVIDER_SITE_OTHER): Payer: Medicare Other | Admitting: *Deleted

## 2019-06-08 DIAGNOSIS — Z8679 Personal history of other diseases of the circulatory system: Secondary | ICD-10-CM | POA: Diagnosis not present

## 2019-06-08 DIAGNOSIS — Z5181 Encounter for therapeutic drug level monitoring: Secondary | ICD-10-CM | POA: Diagnosis not present

## 2019-06-08 DIAGNOSIS — Z9889 Other specified postprocedural states: Secondary | ICD-10-CM

## 2019-06-08 DIAGNOSIS — Z952 Presence of prosthetic heart valve: Secondary | ICD-10-CM

## 2019-06-08 LAB — POCT INR: INR: 2.7 (ref 2.0–3.0)

## 2019-06-08 NOTE — Patient Instructions (Signed)
Description   Continue taking 1 tablet everyday except 1/2 tablet on Thursdays.  Recheck in 5 weeks. Call with any new medications or procedures [phone]

## 2019-06-21 ENCOUNTER — Ambulatory Visit: Payer: Medicare Other | Attending: Family Medicine | Admitting: Physical Therapy

## 2019-06-21 ENCOUNTER — Other Ambulatory Visit: Payer: Self-pay

## 2019-06-21 DIAGNOSIS — R2689 Other abnormalities of gait and mobility: Secondary | ICD-10-CM | POA: Diagnosis not present

## 2019-06-21 DIAGNOSIS — R2681 Unsteadiness on feet: Secondary | ICD-10-CM | POA: Diagnosis not present

## 2019-06-21 DIAGNOSIS — M6281 Muscle weakness (generalized): Secondary | ICD-10-CM | POA: Diagnosis not present

## 2019-06-21 NOTE — Therapy (Signed)
[facility] [address] [place], [place], [address] Phone: [phone]   Fax:  [phone]  Physical Therapy Evaluation  Patient Details  Name: [name] [name] MRN: [id number] Date of Birth: 1951-04-25 Referring Provider (PT): Howard Pouch   Encounter Date: 06/21/2019  PT End of Session - 06/21/19 1351    Visit Number  1    Number of Visits  16    Date for PT Re-Evaluation  08/16/19    PT Start Time  1351    PT Stop Time  1445    PT Time Calculation (min)  54 min    Equipment Utilized During Treatment  Gait belt    Activity Tolerance  Patient tolerated treatment well    Behavior During Therapy  [facility] for tasks assessed/performed       Past Medical History:  Diagnosis Date  . Anxiety   . Arthritis    knees  . Breast cancer ([place]) 1999  . CHF (congestive heart failure) ([place])    Related to severe mitral regurgitation, April, 2013  . 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 Assess by pulmonary team in the hospital April, 2013  . Ejection fraction    EF 60%, echo, April, 2013, with severe MR before mitral valve replacement  . Herpes   . Hypothyroidism   . IBS (irritable bowel syndrome)   . Mitral valve regurgitation    Mitral valve replacement April, 2013, Mitral valve prolapse  . Multiple sclerosis ([place])   . Neurogenic bladder   . Osteoporosis   . Ovarian cyst   . Paroxysmal atrial fibrillation (H[facility])    Rapid atrial fibrillation in-hospital, Rapid cardioversion,  before mitral valve surgery  . Pulmonary hypertension ([place])    Echo, April, 2013, before mitral valve surgery  . S/P Maze operation for atrial fibrillation 01/26/2012   Complete biatrial lesion set using cryothermy via right mini thoracotomy  . S/P mitral valve replacement 01/26/2012   58mm Sorin [employer] Optiform mechanical prosthesis via right mini thoracotomy  . Warfarin anticoagulation    Mechanical mitral  prosthesis, April, 20136    Past Surgical History:  Procedure Laterality Date  . BREAST LUMPECTOMY Right 1999   with sent.node, and axillary dissection (20)  . CHEST TUBE INSERTION  01/26/2012   Procedure: CHEST TUBE INSERTION;  Surgeon: Rexene Alberts, MD;  Location: [facility];  Service: Open Heart Surgery;  Laterality: Left;  . COLONOSCOPY  2010   "normal"  . CYSTOSCOPY  1992  . LAPAROSCOPIC OVARIAN CYSTECTOMY  1978   urethral stricture repair  . LEFT AND RIGHT HEART CATHETERIZATION WITH CORONARY ANGIOGRAM N/A 01/20/2012   Procedure: LEFT AND RIGHT HEART CATHETERIZATION WITH CORONARY ANGIOGRAM;  Surgeon: Burnell Blanks, MD;  Location: [facility] CATH LAB;  Service: Cardiovascular;  Laterality: N/A;  . LYMPHADENECTOMY    . MAZE  01/26/2012   Procedure: MAZE;  Surgeon: Rexene Alberts, MD;  Location: [facility];  Service: Open Heart Surgery;  Laterality: N/A;  . MITRAL VALVE REPLACEMENT  01/26/2012   Procedure: MINIMALLY INVASIVE MITRAL VALVE (MV) REPLACEMENT;  Surgeon: Rexene Alberts, MD;  Location: [facility];  Service: Open Heart Surgery;  Laterality: Right;  . TEE WITHOUT CARDIOVERSION  01/19/2012   Procedure: TRANSESOPHAGEAL ECHOCARDIOGRAM (TEE);  Surgeon: Peter M Martinique, MD;  Location: [facility] ENDOSCOPY;  Service: Cardiovascular;  Laterality: N/A;  . TONSILLECTOMY  1970  . [place]  . WRIST SURGERY Right  2012    There were no vitals filed for this visit.   Subjective Assessment - 06/21/19 1356    Subjective  Patient can walk a little with her cane and uses her walker mainly. She is able to walk across her room at home without AD and she would like to improve her gait balance. She doesn't go out much due to Covid. She does seated exercises 2x/wk online.    Pertinent History  COPD, HTN, Osteoporosis, mitral valve replacement 2018, breast CA, arthritis bil knees    Patient Stated Goals  to walk independently    Currently in Pain?  No/denies         [facility] PT Assessment - 06/21/19  0001      Assessment   Medical Diagnosis  gait instability    Referring Provider (PT)  Howard Pouch    Onset Date/Surgical Date  10/19/18    Hand Dominance  Right    Next MD Visit  not scheduled    Prior Therapy  yes      Precautions   Precautions  Fall    Precaution Comments  OSTEOPOROSIS      Restrictions   Weight Bearing Restrictions  No      Balance Screen   Has the patient fallen in the past 6 months  No    Has the patient had a decrease in activity level because of a fear of falling?   No    Is the patient reluctant to leave their home because of a fear of falling?   No      Home Environment   Living Environment  Private residence    Living Arrangements  Alone    Type of [place] Layout  Multi-level    Alternate Level Stairs-Number of Steps  14    Alternate Level Stairs-Rails  Can reach both    [place] - 4 wheels;Cane - single point;Walker - 2 wheels;Tub bench;Grab bars - toilet;Grab bars - tub/shower    Additional Comments  uses step to gait on stairs      Prior Function   Level of Independence  Independent with community mobility with device    Vocation  Retired      Mining engineer Comments  sits and stands with forward head, rounded shoulders and left trunk lean      ROM / Strength   AROM / PROM / Strength  Strength      Strength   Overall Strength Comments  Rt hip flex 4-/5, left 3+/5; seated hip aBD ADD 5/5, knee ext and flex 5/5, ankle DF 5/5      Flexibility   Soft Tissue Assessment /Muscle Length  yes    Quadratus Lumborum  gastroc tightness bil      Special Tests   Other special tests  --      Transfers   Transfers  Sit to Stand;Stand Pivot Transfers    Sit to Stand  With upper extremity assist    Five time sit to stand comments   5x sit to stand 34 sec    Stand Pivot Transfers  6: Modified independent (Device/Increase time)      Ambulation/Gait   Ambulation/Gait  Yes    Ambulation Distance (Feet)   5 Feet    Assistive device  1 person hand held assist    Gait Pattern  Decreased stance time - right;Decreased step length - left;Decreased stride length;Decreased hip/knee flexion -  right;Decreased hip/knee flexion - left;Decreased dorsiflexion - right;Decreased dorsiflexion - left;Decreased weight shift to right    Ambulation Surface  Level      Standardized Balance Assessment   Standardized Balance Assessment  Berg Balance Test      Berg Balance Test   Sit to Stand  Able to stand  independently using hands    Standing Unsupported  Able to stand safely 2 minutes    Sitting with Back Unsupported but Feet Supported on Floor or Stool  Able to sit safely and securely 2 minutes    Stand to Sit  Controls descent by using hands    Transfers  Able to transfer safely, definite need of hands    Standing Unsupported with Eyes Closed  Able to stand 10 seconds safely    Standing Unsupported with Feet Together  Needs help to attain position and unable to hold for 15 seconds    From Standing, Reach Forward with Outstretched Arm  Can reach forward >5 cm safely (2")    From Standing Position, Pick up Object from Floor  Able to pick up shoe, needs supervision    From Standing Position, Turn to Look Behind Over each Shoulder  Needs supervision when turning    Turn 360 Degrees  Needs close supervision or verbal cueing    Standing Unsupported, Alternately Place Feet on Step/Stool  Needs assistance to keep from falling or unable to try    Standing Unsupported, One Foot in [place] to take small step independently and hold 30 seconds    Standing on One Leg  Unable to try or needs assist to prevent fall    Total Score  30    Berg comment:  recommend walker at all times                Objective measurements completed on examination: See above findings.              PT Education - 06/21/19 1454    Education Details  HEP sit to stand; Educated patient on balance, safety and use of RW at  all times.    Person(s) Educated  Patient    Methods  Explanation;Demonstration    Comprehension  Verbalized understanding;Returned demonstration       PT Short Term Goals - 06/21/19 1504      PT SHORT TERM GOAL #1   Title  Independent with intial  HEP for strengthening and flexibility to increase function.    Time  3    Period  Weeks    Status  New    Target Date  07/12/19      PT SHORT TERM GOAL #2   Title  Patient to decrease 5x sit to stand time to 15 seconds or less to decrease fall risk.    Time  4    Period  Weeks    Status  New    Target Date  07/19/19        PT Long Term Goals - 06/21/19 1505      PT LONG TERM GOAL #1   Title  Independent with HEP for strengthening and flexibility to increase function.    Time  8    Period  Weeks    Status  New    Target Date  08/16/19      PT LONG TERM GOAL #2   Title  Pt will amb. 500' over even/uneven terrain with LRAD at MOD I level to improve functional mobility.  Time  8    Period  Weeks    Status  New      PT LONG TERM GOAL #3   Title  Pt will amb. 150' with SPC at MOD I level (indoors) in order to safely amb. at home.     Time  8    Period  Weeks    Status  New      PT LONG TERM GOAL #4   Title  Patient able to climb stairs with a reciprocal gait pattern and BUE support    Time  8    Period  Weeks    Status  New      PT LONG TERM GOAL #5   Title  Patient will demostrate reduced risk of falling reflected by incr Berg Balance Assessment to 39/56    Time  8    Period  Weeks    Status  New             Plan - 06/21/19 1455    Clinical Impression Statement  Patient presents to PT for gait instability. She scored 30/56 on the BERG Balance assesssment making her a significant fall risk and should be using a walker at all times. She presented to PT with a SPC but utilized a clinic w/c to get into and out of clinic. She has significant flexibility and strength deficits as well. Patient lives in a two story  home and uses a step to gait and BUE support to climb stairs. She will benefit from PT to address these deficits and decrease fall risk.    Personal Factors and Comorbidities  Age;Fitness;Comorbidity 3+    Comorbidities  COPD, HTN, Osteoporosis, mitral valve replacement 2018, breast CA, arthritis bil knees    Examination-Activity Limitations  Locomotion Level;Transfers;Stairs;Stand    Examination-Participation Restrictions  Community Activity    Stability/Clinical Decision Making  Evolving/Moderate complexity    Clinical Decision Making  Moderate    Rehab Potential  Good    PT Frequency  2x / week    PT Duration  8 weeks    PT Treatment/Interventions  ADLs/Self Care Home Management;Gait training;Stair training;Therapeutic activities;Therapeutic exercise;Balance training;Neuromuscular re-education;Patient/family education;Manual techniques    PT Next Visit Plan  gait, balance, LE strengthening    PT Home Exercise Plan  Sit to stand       Patient will benefit from skilled therapeutic intervention in order to improve the following deficits and impairments:  Decreased mobility, Postural dysfunction, Decreased strength, Impaired flexibility, Difficulty walking, Decreased balance  Visit Diagnosis: Other abnormalities of gait and mobility - Plan: PT plan of care cert/re-cert     Problem List Patient Active Problem List   Diagnosis Date Noted  . Nonischemic cardiomyopathy ([place]) 07/20/2018  . Chest pain 07/20/2018  . Scoliosis 11/20/2016  . Long term current use of anticoagulant 09/18/2016  . Compression fracture of lumbar spine, non-traumatic, sequela 08/11/2016  . Multiple falls 08/11/2016  . At high risk for injury related to fall 08/11/2016  . Encounter for therapeutic drug monitoring 11/27/2013  . Hypothyroidism   . CHF (congestive heart failure) ([place])   . Paroxysmal atrial fibrillation (HCC)   . Arthritis   . Neurogenic bladder   . Warfarin anticoagulation   . First degree heart  block 01/29/2012  . 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 S/P Maze operation for atrial fibrillation 01/26/2012  . Anxiety 01/18/2012  . Multiple sclerosis ([place]) 01/19/2007    Madelyn Flavors PT 06/21/2019, 3:13 PM  [facility]  [address] [place], [place], [address] Phone: [phone]   Fax:  [phone]  Name: [name] [name] MRN: [id number] Date of Birth: 08/03/1951

## 2019-06-28 ENCOUNTER — Ambulatory Visit: Payer: Medicare Other | Admitting: Physical Therapy

## 2019-06-28 ENCOUNTER — Encounter: Payer: Self-pay | Admitting: Physical Therapy

## 2019-06-28 ENCOUNTER — Other Ambulatory Visit: Payer: Self-pay

## 2019-06-28 DIAGNOSIS — R2689 Other abnormalities of gait and mobility: Secondary | ICD-10-CM

## 2019-06-28 DIAGNOSIS — M6281 Muscle weakness (generalized): Secondary | ICD-10-CM | POA: Diagnosis not present

## 2019-06-28 DIAGNOSIS — R2681 Unsteadiness on feet: Secondary | ICD-10-CM | POA: Diagnosis not present

## 2019-06-28 NOTE — Therapy (Signed)
[place], [place], [address] Phone: [phone]   Fax:  [phone]  Physical Therapy Treatment  Patient Details  Name: [name] [name] MRN: [id number] Date of Birth: [birth year] Referring Provider (PT): Howard Pouch   Encounter Date: 06/28/2019  PT End of Session - 06/28/19 1618    Visit Number  2    Number of Visits  16    Date for PT Re-Evaluation  08/16/19    PT Start Time  1525    PT Stop Time  1615    PT Time Calculation (min)  50 min    Activity Tolerance  Patient tolerated treatment well       Past Medical History:  Diagnosis Date  . Anxiety   . Arthritis    knees  . Breast cancer ([place]) 1999  . CHF (congestive heart failure) ([place])    Related to severe mitral regurgitation, April, 2013  . 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 Assess by pulmonary team in the hospital April, 2013  . Ejection fraction    EF 60%, echo, April, 2013, with severe MR before mitral valve replacement  . Herpes   . Hypothyroidism   . IBS (irritable bowel syndrome)   . Mitral valve regurgitation    Mitral valve replacement April, 2013, Mitral valve prolapse  . Multiple sclerosis ([place])   . Neurogenic bladder   . Osteoporosis   . Ovarian cyst   . Paroxysmal atrial fibrillation (HCC)    Rapid atrial fibrillation in-hospital, Rapid cardioversion,  before mitral valve surgery  . Pulmonary hypertension ([place])    Echo, April, 2013, before mitral valve surgery  . S/P Maze operation for atrial fibrillation 01/26/2012   Complete biatrial lesion set using cryothermy via right mini thoracotomy  . S/P mitral valve replacement 01/26/2012   65mm Sorin [employer] Optiform mechanical prosthesis via right mini thoracotomy  . Warfarin anticoagulation    Mechanical mitral prosthesis, April, 20136    Past Surgical History:  Procedure Laterality Date  . BREAST LUMPECTOMY Right 1999    with sent.node, and axillary dissection (20)  . CHEST TUBE INSERTION  01/26/2012   Procedure: CHEST TUBE INSERTION;  Surgeon: Rexene Alberts, MD;  Location: [facility];  Service: Open Heart Surgery;  Laterality: Left;  . COLONOSCOPY  2010   "normal"  . CYSTOSCOPY  1992  . LAPAROSCOPIC OVARIAN CYSTECTOMY  1978   urethral stricture repair  . LEFT AND RIGHT HEART CATHETERIZATION WITH CORONARY ANGIOGRAM N/A 01/20/2012   Procedure: LEFT AND RIGHT HEART CATHETERIZATION WITH CORONARY ANGIOGRAM;  Surgeon: Burnell Blanks, MD;  Location: [facility] CATH LAB;  Service: Cardiovascular;  Laterality: N/A;  . LYMPHADENECTOMY    . MAZE  01/26/2012   Procedure: MAZE;  Surgeon: Rexene Alberts, MD;  Location: [facility];  Service: Open Heart Surgery;  Laterality: N/A;  . MITRAL VALVE REPLACEMENT  01/26/2012   Procedure: MINIMALLY INVASIVE MITRAL VALVE (MV) REPLACEMENT;  Surgeon: Rexene Alberts, MD;  Location: [facility];  Service: Open Heart Surgery;  Laterality: Right;  . TEE WITHOUT CARDIOVERSION  01/19/2012   Procedure: TRANSESOPHAGEAL ECHOCARDIOGRAM (TEE);  Surgeon: Peter M Martinique, MD;  Location: [facility] ENDOSCOPY;  Service: Cardiovascular;  Laterality: N/A;  . TONSILLECTOMY  1970  . [place]  . WRIST SURGERY Right 2012    There were no vitals filed for this visit.  Subjective Assessment - 06/28/19 1528  Subjective  patient walked in with RW, feeling pretty good. no pain reported.    Currently in Pain?  No/denies                       [facility] Adult PT Treatment/Exercise - 06/28/19 0001      Ambulation/Gait   Ambulation/Gait  Yes    Ambulation/Gait Assistance  6: Modified independent (Device/Increase time)    Ambulation Distance (Feet)  200 Feet    Assistive device  Rolling walker    Gait Pattern  Decreased stance time - right;Decreased step length - left;Decreased stride length;Decreased hip/knee flexion - right;Decreased hip/knee flexion - left;Decreased dorsiflexion - right;Decreased  dorsiflexion - left;Decreased weight shift to right    Ambulation Surface  Level;Outdoor    Gait Comments  from clinic to car, tactile cues to maintain posture       Exercises   Exercises  Lumbar;Knee/Hip      Lumbar Exercises: Aerobic   Nustep  lvl 2x25min      Lumbar Exercises: Seated   LAQ on Chair Weights (lbs)  --    Sit to Stand  10 reps   cues to bring hips fwd, shoulders back, and level shlders     Knee/Hip Exercises: Seated   Long Arc Quad  10 reps;Both;Strengthening;2 sets    Long Arc Quad Weight  2 lbs.    Marching  Strengthening;Both;2 sets;10 reps    Marching Weights  2 lbs.    Hamstring Curl  Strengthening;2 sets;10 reps   Y Tband   Abduction/Adduction   Strengthening;Both;2 sets;10 reps   Y Tband              PT Short Term Goals - 06/21/19 1504      PT SHORT TERM GOAL #1   Title  Independent with intial  HEP for strengthening and flexibility to increase function.    Time  3    Period  Weeks    Status  New    Target Date  07/12/19      PT SHORT TERM GOAL #2   Title  Patient to decrease 5x sit to stand time to 15 seconds or less to decrease fall risk.    Time  4    Period  Weeks    Status  New    Target Date  07/19/19        PT Long Term Goals - 06/21/19 1505      PT LONG TERM GOAL #1   Title  Independent with HEP for strengthening and flexibility to increase function.    Time  8    Period  Weeks    Status  New    Target Date  08/16/19      PT LONG TERM GOAL #2   Title  Pt will amb. 500' over even/uneven terrain with LRAD at MOD I level to improve functional mobility.    Time  8    Period  Weeks    Status  New      PT LONG TERM GOAL #3   Title  Pt will amb. 150' with SPC at MOD I level (indoors) in order to safely amb. at home.     Time  8    Period  Weeks    Status  New      PT LONG TERM GOAL #4   Title  Patient able to climb stairs with a reciprocal gait pattern and BUE support    Time  8    Period  Weeks    Status  New       PT LONG TERM GOAL #5   Title  Patient will demostrate reduced risk of falling reflected by incr Berg Balance Assessment to 39/56    Time  8    Period  Weeks    Status  New            Plan - 06/28/19 1618    Clinical Impression Statement  pt. very limited in abilities for gait, balance, and strength. Pt. visibly fatigued during exercises, and during gait back to the car 4 breaks were taken to recoup. Pt. Able to complete exercises despite fatigue. Heavy tactile and verbal cues to maintain posture, proper STS technique, and for form of exercises. Pt will continue to benefit from PT to address these deficits.    PT Treatment/Interventions  ADLs/Self Care Home Management;Gait training;Stair training;Therapeutic activities;Therapeutic exercise;Balance training;Neuromuscular re-education;Patient/family education;Manual techniques    PT Next Visit Plan  gait, balance, LE strengthening    PT Home Exercise Plan  Sit to stand       Patient will benefit from skilled therapeutic intervention in order to improve the following deficits and impairments:  Decreased mobility, Postural dysfunction, Decreased strength, Impaired flexibility, Difficulty walking, Decreased balance  Visit Diagnosis: Other abnormalities of gait and mobility     Problem List Patient Active Problem List   Diagnosis Date Noted  . Nonischemic cardiomyopathy ([place]) 07/20/2018  . Chest pain 07/20/2018  . Scoliosis 11/20/2016  . Long term current use of anticoagulant 09/18/2016  . Compression fracture of lumbar spine, non-traumatic, sequela 08/11/2016  . Multiple falls 08/11/2016  . At high risk for injury related to fall 08/11/2016  . Encounter for therapeutic drug monitoring 11/27/2013  . Hypothyroidism   . CHF (congestive heart failure) ([place])   . Paroxysmal atrial fibrillation (HCC)   . Arthritis   . Neurogenic bladder   . Warfarin anticoagulation   . First degree heart block 01/29/2012  . S/P mitral valve  replacement 01/26/2012  . S/P Maze operation for atrial fibrillation 01/26/2012  . Anxiety 01/18/2012  . Multiple sclerosis ([place]) 01/19/2007    Dylan Mandeep Kiser,SPTA 06/28/2019, 4:22 PM  [place] [id number] [facility] [place], [place], [address] Phone: [phone]   Fax:  [phone]  Name: RENNEE LIENHART MRN: [id number] Date of Birth: Oct 31, 1950

## 2019-06-29 ENCOUNTER — Ambulatory Visit: Payer: Medicare Other | Admitting: Physical Therapy

## 2019-06-29 DIAGNOSIS — M6281 Muscle weakness (generalized): Secondary | ICD-10-CM | POA: Diagnosis not present

## 2019-06-29 DIAGNOSIS — R2681 Unsteadiness on feet: Secondary | ICD-10-CM | POA: Diagnosis not present

## 2019-06-29 DIAGNOSIS — R2689 Other abnormalities of gait and mobility: Secondary | ICD-10-CM | POA: Diagnosis not present

## 2019-06-29 NOTE — Therapy (Signed)
[place], [place], [address] Phone: [phone]   Fax:  [phone]  Physical Therapy Treatment  Patient Details  Name: [name] [name] MRN: [id number] Date of Birth: Dec 04, 1950 Referring Provider (PT): Howard Pouch   Encounter Date: 06/29/2019  PT End of Session - 06/29/19 1437    Visit Number  3    Number of Visits  16    Date for PT Re-Evaluation  08/16/19    PT Start Time  1435    PT Stop Time  1518    PT Time Calculation (min)  43 min    Activity Tolerance  Patient tolerated treatment well    Behavior During Therapy  [facility] for tasks assessed/performed       Past Medical History:  Diagnosis Date  . Anxiety   . Arthritis    knees  . Breast cancer ([place]) 1999  . CHF (congestive heart failure) ([place])    Related to severe mitral regurgitation, April, 2013  . 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 Assess by pulmonary team in the hospital April, 2013  . Ejection fraction    EF 60%, echo, April, 2013, with severe MR before mitral valve replacement  . Herpes   . Hypothyroidism   . IBS (irritable bowel syndrome)   . Mitral valve regurgitation    Mitral valve replacement April, 2013, Mitral valve prolapse  . Multiple sclerosis ([place])   . Neurogenic bladder   . Osteoporosis   . Ovarian cyst   . Paroxysmal atrial fibrillation (HCC)    Rapid atrial fibrillation in-hospital, Rapid cardioversion,  before mitral valve surgery  . Pulmonary hypertension ([place])    Echo, April, 2013, before mitral valve surgery  . S/P Maze operation for atrial fibrillation 01/26/2012   Complete biatrial lesion set using cryothermy via right mini thoracotomy  . S/P mitral valve replacement 01/26/2012   59mm Sorin [employer] Optiform mechanical prosthesis via right mini thoracotomy  . Warfarin anticoagulation    Mechanical mitral prosthesis, April, 20136    Past Surgical History:   Procedure Laterality Date  . BREAST LUMPECTOMY Right 1999   with sent.node, and axillary dissection (20)  . CHEST TUBE INSERTION  01/26/2012   Procedure: CHEST TUBE INSERTION;  Surgeon: Rexene Alberts, MD;  Location: [facility];  Service: Open Heart Surgery;  Laterality: Left;  . COLONOSCOPY  2010   "normal"  . CYSTOSCOPY  1992  . LAPAROSCOPIC OVARIAN CYSTECTOMY  1978   urethral stricture repair  . LEFT AND RIGHT HEART CATHETERIZATION WITH CORONARY ANGIOGRAM N/A 01/20/2012   Procedure: LEFT AND RIGHT HEART CATHETERIZATION WITH CORONARY ANGIOGRAM;  Surgeon: Burnell Blanks, MD;  Location: [facility] CATH LAB;  Service: Cardiovascular;  Laterality: N/A;  . LYMPHADENECTOMY    . MAZE  01/26/2012   Procedure: MAZE;  Surgeon: Rexene Alberts, MD;  Location: [facility];  Service: Open Heart Surgery;  Laterality: N/A;  . MITRAL VALVE REPLACEMENT  01/26/2012   Procedure: MINIMALLY INVASIVE MITRAL VALVE (MV) REPLACEMENT;  Surgeon: Rexene Alberts, MD;  Location: [facility];  Service: Open Heart Surgery;  Laterality: Right;  . TEE WITHOUT CARDIOVERSION  01/19/2012   Procedure: TRANSESOPHAGEAL ECHOCARDIOGRAM (TEE);  Surgeon: Peter M Martinique, MD;  Location: [facility] ENDOSCOPY;  Service: Cardiovascular;  Laterality: N/A;  . TONSILLECTOMY  1970  . [place]  . WRIST SURGERY Right 2012    There were no vitals filed for  this visit.  Subjective Assessment - 06/29/19 1439    Subjective  I was sore in the hips after yesterday    Pertinent History  COPD, HTN, Osteoporosis, mitral valve replacement 2018, breast CA, arthritis bil knees    Patient Stated Goals  to walk independently    Currently in Pain?  No/denies                       [facility] Adult PT Treatment/Exercise - 06/29/19 0001      Ambulation/Gait   Ambulation/Gait  Yes    Ambulation/Gait Assistance  6: Modified independent (Device/Increase time)    Ambulation Distance (Feet)  200 Feet    Assistive device  4-wheeled walker    Gait  Pattern  Decreased stride length;Decreased step length - left;Decreased hip/knee flexion - right;Decreased hip/knee flexion - left;Decreased dorsiflexion - right;Decreased dorsiflexion - left;Right flexed knee in stance;Left flexed knee in stance    Ambulation Surface  Level;Indoor;Outdoor    Gait Comments  from clinic to car: cues to increase step and stride length and stand upright in walker      Self-Care   Self-Care  Other Self-Care Comments    Other Self-Care Comments   demonstrated easier technique for pt to place rollator walker in her trunk      Lumbar Exercises: Stretches   Other Lumbar Stretch Exercise  cerv stretch to right 2x 30 sec     Other Lumbar Stretch Exercise  SB to right with left hand overhead for QL stretch      Lumbar Exercises: Aerobic   Nustep  lvl 3x93min      Lumbar Exercises: Standing   Row  Strengthening;Theraband;10 reps    Theraband Level (Row)  Level 2 (Red)    Row Limitations  before we did 2x10 seated on disc    Shoulder Extension  Strengthening;Both;20 reps;Theraband    Theraband Level (Shoulder Extension)  Level 2 (Red)      Lumbar Exercises: Seated   Sit to Stand  5 reps    Sit to Stand Limitations  performed intermittently during treatment with VCs for equal WB and proper technique    Other Seated Lumbar Exercises  on disc 2x10 pelvic tilt               PT Short Term Goals - 06/21/19 1504      PT SHORT TERM GOAL #1   Title  Independent with intial  HEP for strengthening and flexibility to increase function.    Time  3    Period  Weeks    Status  New    Target Date  07/12/19      PT SHORT TERM GOAL #2   Title  Patient to decrease 5x sit to stand time to 15 seconds or less to decrease fall risk.    Time  4    Period  Weeks    Status  New    Target Date  07/19/19        PT Long Term Goals - 06/21/19 1505      PT LONG TERM GOAL #1   Title  Independent with HEP for strengthening and flexibility to increase function.    Time   8    Period  Weeks    Status  New    Target Date  08/16/19      PT LONG TERM GOAL #2   Title  Pt will amb. 500' over even/uneven terrain with LRAD at MOD  I level to improve functional mobility.    Time  8    Period  Weeks    Status  New      PT LONG TERM GOAL #3   Title  Pt will amb. 150' with SPC at MOD I level (indoors) in order to safely amb. at home.     Time  8    Period  Weeks    Status  New      PT LONG TERM GOAL #4   Title  Patient able to climb stairs with a reciprocal gait pattern and BUE support    Time  8    Period  Weeks    Status  New      PT LONG TERM GOAL #5   Title  Patient will demostrate reduced risk of falling reflected by incr Berg Balance Assessment to 39/56    Time  8    Period  Weeks    Status  New            Plan - 06/29/19 1524    Clinical Impression Statement  Patient able to correct posture with constant VCs. Encouraged pt to work in [employer] at home to weight shift right and bring head to neutral holding on for balance.    Comorbidities  COPD, HTN, Osteoporosis, mitral valve replacement 2018, breast CA, arthritis bil knees    PT Treatment/Interventions  ADLs/Self Care Home Management;Gait training;Stair training;Therapeutic activities;Therapeutic exercise;Balance training;Neuromuscular re-education;Patient/family education;Manual techniques    PT Next Visit Plan  Focus on equal WB and keeping head in neutral; gait, balance, LE strengthening    PT Home Exercise Plan  Sit to stand, weight shifting right, neck stretch in [employer]       Patient will benefit from skilled therapeutic intervention in order to improve the following deficits and impairments:  Decreased mobility, Postural dysfunction, Decreased strength, Impaired flexibility, Difficulty walking, Decreased balance  Visit Diagnosis: Other abnormalities of gait and mobility     Problem List Patient Active Problem List   Diagnosis Date Noted  . Nonischemic cardiomyopathy ([place])  07/20/2018  . Chest pain 07/20/2018  . Scoliosis 11/20/2016  . Long term current use of anticoagulant 09/18/2016  . Compression fracture of lumbar spine, non-traumatic, sequela 08/11/2016  . Multiple falls 08/11/2016  . At high risk for injury related to fall 08/11/2016  . Encounter for therapeutic drug monitoring 11/27/2013  . Hypothyroidism   . CHF (congestive heart failure) ([place])   . Paroxysmal atrial fibrillation (HCC)   . Arthritis   . Neurogenic bladder   . Warfarin anticoagulation   . First degree heart block 01/29/2012  . 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 S/P Maze operation for atrial fibrillation 01/26/2012  . Anxiety 01/18/2012  . Multiple sclerosis ([place]) 01/19/2007   [name] [name] PT 06/29/2019, 3:28 PM  [place] [address] [place], [place], [address] Phone: [phone]   Fax:  [phone]  Name: [name] [name] MRN: [id number] Date of Birth: 12-12-1950

## 2019-07-03 DIAGNOSIS — M810 Age-related osteoporosis without current pathological fracture: Secondary | ICD-10-CM | POA: Diagnosis not present

## 2019-07-03 DIAGNOSIS — Z853 Personal history of malignant neoplasm of breast: Secondary | ICD-10-CM | POA: Diagnosis not present

## 2019-07-03 DIAGNOSIS — Z8262 Family history of osteoporosis: Secondary | ICD-10-CM | POA: Diagnosis not present

## 2019-07-03 DIAGNOSIS — Z1231 Encounter for screening mammogram for malignant neoplasm of breast: Secondary | ICD-10-CM | POA: Diagnosis not present

## 2019-07-03 LAB — HM DEXA SCAN

## 2019-07-03 LAB — HM MAMMOGRAPHY

## 2019-07-04 ENCOUNTER — Other Ambulatory Visit: Payer: Self-pay

## 2019-07-04 ENCOUNTER — Encounter: Payer: Self-pay | Admitting: Physical Therapy

## 2019-07-04 ENCOUNTER — Ambulatory Visit: Payer: Medicare Other | Admitting: Physical Therapy

## 2019-07-04 DIAGNOSIS — R2689 Other abnormalities of gait and mobility: Secondary | ICD-10-CM

## 2019-07-04 DIAGNOSIS — R2681 Unsteadiness on feet: Secondary | ICD-10-CM | POA: Diagnosis not present

## 2019-07-04 DIAGNOSIS — M6281 Muscle weakness (generalized): Secondary | ICD-10-CM | POA: Diagnosis not present

## 2019-07-04 NOTE — Therapy (Signed)
[address] [place], [place], [address] Phone: [phone]   Fax:  [phone]  Physical Therapy Treatment  Patient Details  Name: [name] [name] MRN: [id number] Date of Birth: [birth year] Referring Provider (PT): Howard Pouch   Encounter Date: 07/04/2019  PT End of Session - 07/04/19 1600    Visit Number  4    Number of Visits  16    Date for PT Re-Evaluation  08/16/19    PT Start Time  [id number]    PT Stop Time  1600    PT Time Calculation (min)  45 min    Activity Tolerance  Patient tolerated treatment well    Behavior During Therapy  [facility] for tasks assessed/performed       Past Medical History:  Diagnosis Date  . Anxiety   . Arthritis    knees  . Breast cancer ([place]) 1999  . CHF (congestive heart failure) ([place])    Related to severe mitral regurgitation, April, 2013  . 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 Assess by pulmonary team in the hospital April, 2013  . Ejection fraction    EF 60%, echo, April, 2013, with severe MR before mitral valve replacement  . Herpes   . Hypothyroidism   . IBS (irritable bowel syndrome)   . Mitral valve regurgitation    Mitral valve replacement April, 2013, Mitral valve prolapse  . Multiple sclerosis ([place])   . Neurogenic bladder   . Osteoporosis   . Ovarian cyst   . Paroxysmal atrial fibrillation (HCC)    Rapid atrial fibrillation in-hospital, Rapid cardioversion,  before mitral valve surgery  . Pulmonary hypertension ([place])    Echo, April, 2013, before mitral valve surgery  . S/P Maze operation for atrial fibrillation 01/26/2012   Complete biatrial lesion set using cryothermy via right mini thoracotomy  . S/P mitral valve replacement 01/26/2012   46mm Sorin [employer] Optiform mechanical prosthesis via right mini thoracotomy  . Warfarin anticoagulation    Mechanical mitral prosthesis, April, 20136    Past Surgical History:   Procedure Laterality Date  . BREAST LUMPECTOMY Right 1999   with sent.node, and axillary dissection (20)  . CHEST TUBE INSERTION  01/26/2012   Procedure: CHEST TUBE INSERTION;  Surgeon: Rexene Alberts, MD;  Location: [place];  Service: Open Heart Surgery;  Laterality: Left;  . COLONOSCOPY  2010   "normal"  . CYSTOSCOPY  1992  . LAPAROSCOPIC OVARIAN CYSTECTOMY  1978   urethral stricture repair  . LEFT AND RIGHT HEART CATHETERIZATION WITH CORONARY ANGIOGRAM N/A 01/20/2012   Procedure: LEFT AND RIGHT HEART CATHETERIZATION WITH CORONARY ANGIOGRAM;  Surgeon: Burnell Blanks, MD;  Location[facility] CATH LAB;  Service: Cardiovascular;  Laterality: N/A;  . LYMPHADENECTOMY    . MAZE  01/26/2012   Procedure: MAZE;  Surgeon: Rexene Alberts, MD;  Location: [place];  Service: Open Heart Surgery;  Laterality: N/A;  . MITRAL VALVE REPLACEMENT  01/26/2012   Procedure: MINIMALLY INVASIVE MITRAL VALVE (MV) REPLACEMENT;  Surgeon: Rexene Alberts, MD;  Location: [place];  Service: Open Heart Surgery;  Laterality: Right;  . TEE WITHOUT CARDIOVERSION  01/19/2012   Procedure: TRANSESOPHAGEAL ECHOCARDIOGRAM (TEE);  Surgeon: Peter M Martinique, MD;  Location: [facility] ENDOSCOPY;  Service: Cardiovascular;  Laterality: N/A;  . TONSILLECTOMY  1970  . [place]  . WRIST SURGERY Right 2012    There were no vitals filed for  this visit.  Subjective Assessment - 07/04/19 1520    Subjective  "Feeling good"    Pertinent History  COPD, HTN, Osteoporosis, mitral valve replacement 2018, breast CA, arthritis bil knees    Patient Stated Goals  to walk independently    Currently in Pain?  No/denies                       [facility] Adult PT Treatment/Exercise - 07/04/19 0001      Lumbar Exercises: Aerobic   Nustep  lvl 3x6min      Lumbar Exercises: Standing   Row  Strengthening;Theraband;10 reps    Theraband Level (Row)  Level 2 (Red)    Shoulder Extension  Strengthening;Both;20 reps;Theraband     Theraband Level (Shoulder Extension)  Level 2 (Red)      Lumbar Exercises: Seated   Sit to Stand  5 reps   x3, airex in blue chair     Knee/Hip Exercises: Standing   Other Standing Knee Exercises  Heel raises with rollator 2x10     Other Standing Knee Exercises  Standing march with rolator 2x10      Knee/Hip Exercises: Seated   Long Arc Quad  10 reps;Both;Strengthening;2 sets    Long Arc Quad Weight  2 lbs.    Marching  Strengthening;Both;2 sets;10 reps    Marching Weights  3 lbs.    Hamstring Curl  Strengthening;2 sets;10 reps    Hamstring Limitations  green               PT Short Term Goals - 06/21/19 1504      PT SHORT TERM GOAL #1   Title  Independent with intial  HEP for strengthening and flexibility to increase function.    Time  3    Period  Weeks    Status  New    Target Date  07/12/19      PT SHORT TERM GOAL #2   Title  Patient to decrease 5x sit to stand time to 15 seconds or less to decrease fall risk.    Time  4    Period  Weeks    Status  New    Target Date  07/19/19        PT Long Term Goals - 06/21/19 1505      PT LONG TERM GOAL #1   Title  Independent with HEP for strengthening and flexibility to increase function.    Time  8    Period  Weeks    Status  New    Target Date  08/16/19      PT LONG TERM GOAL #2   Title  Pt will amb. 500' over even/uneven terrain with LRAD at MOD I level to improve functional mobility.    Time  8    Period  Weeks    Status  New      PT LONG TERM GOAL #3   Title  Pt will amb. 150' with SPC at MOD I level (indoors) in order to safely amb. at home.     Time  8    Period  Weeks    Status  New      PT LONG TERM GOAL #4   Title  Patient able to climb stairs with a reciprocal gait pattern and BUE support    Time  8    Period  Weeks    Status  New      PT LONG TERM GOAL #5  Title  Patient will demostrate reduced risk of falling reflected by incr Berg Balance Assessment to 39/56    Time  8    Period   Weeks    Status  New            Plan - 07/04/19 1600    Clinical Impression Statement  Pt did really well with today's interventions. Increased reps with sit to stands. Added some LE strengthening interventions as well. No reports of increase pain. Fatigue with both seated and standing marches. Postural cues needed with standing rows and extensions.    Comorbidities  COPD, HTN, Osteoporosis, mitral valve replacement 2018, breast CA, arthritis bil knees    Examination-Activity Limitations  Locomotion Level;Transfers;Stairs;Stand    Examination-Participation Restrictions  Community Activity    Stability/Clinical Decision Making  Evolving/Moderate complexity    Rehab Potential  Good    PT Frequency  2x / week    PT Duration  8 weeks    PT Treatment/Interventions  ADLs/Self Care Home Management;Gait training;Stair training;Therapeutic activities;Therapeutic exercise;Balance training;Neuromuscular re-education;Patient/family education;Manual techniques    PT Next Visit Plan  Focus on equal WB and keeping head in neutral; gait, balance, LE strengthening       Patient will benefit from skilled therapeutic intervention in order to improve the following deficits and impairments:  Decreased mobility, Postural dysfunction, Decreased strength, Impaired flexibility, Difficulty walking, Decreased balance  Visit Diagnosis: Other abnormalities of gait and mobility     Problem List Patient Active Problem List   Diagnosis Date Noted  . Nonischemic cardiomyopathy ([place]) 07/20/2018  . Chest pain 07/20/2018  . Scoliosis 11/20/2016  . Long term current use of anticoagulant 09/18/2016  . Compression fracture of lumbar spine, non-traumatic, sequela 08/11/2016  . Multiple falls 08/11/2016  . At high risk for injury related to fall 08/11/2016  . Encounter for therapeutic drug monitoring 11/27/2013  . Hypothyroidism   . CHF (congestive heart failure) ([place])   . Paroxysmal atrial fibrillation (HCC)    . Arthritis   . Neurogenic bladder   . Warfarin anticoagulation   . First degree heart block 01/29/2012  . 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 S/P Maze operation for atrial fibrillation 01/26/2012  . Anxiety 01/18/2012  . Multiple sclerosis ([place]) 01/19/2007    Scot Jun, PTA 07/04/2019, 4:12 PM  [address] [place], [place], [address] Phone: [phone]   Fax:  [phone]  Name: [name] [name] MRN: [id number] Date of Birth: 1951-01-19

## 2019-07-05 ENCOUNTER — Telehealth: Payer: Self-pay

## 2019-07-05 DIAGNOSIS — M810 Age-related osteoporosis without current pathological fracture: Secondary | ICD-10-CM | POA: Insufficient documentation

## 2019-07-05 NOTE — Telephone Encounter (Signed)
Called patient and left voicemail to call me back to discuss results.

## 2019-07-05 NOTE — Telephone Encounter (Signed)
Please inform patient the following information: Mammogram is normal.  Bone density resulted with worsening osteoporosis in both hips.  - Fosamax is recommend in patients with osteoporosis. It is a once a week pill. It must be taken on an empty stomach with a full glass of water.  - the is also an every 6 month injection called prolia- however, insurance requires trial of fosamax first typically.  If she is willing to start medication I will call it in for her.   Repeat dexa in 2 years.

## 2019-07-05 NOTE — Telephone Encounter (Signed)
[facility] report for Bone density and mammogram faxed to Dr Raoul Pitch. Abstracted into chart.  Mammogram results- BI-RADS Cat 2 Placed on Dr Lucita Lora desk to review.

## 2019-07-06 ENCOUNTER — Ambulatory Visit: Payer: Medicare Other | Admitting: Physical Therapy

## 2019-07-06 NOTE — Telephone Encounter (Signed)
Pt was called and given information. Pt stated she would need to speak with holistic doctor before starting any medications. Pt will call back after she speaks with her.

## 2019-07-10 ENCOUNTER — Encounter: Payer: Self-pay | Admitting: Physical Therapy

## 2019-07-10 ENCOUNTER — Other Ambulatory Visit: Payer: Self-pay

## 2019-07-10 ENCOUNTER — Ambulatory Visit: Payer: Medicare Other | Admitting: Physical Therapy

## 2019-07-10 DIAGNOSIS — R2681 Unsteadiness on feet: Secondary | ICD-10-CM | POA: Diagnosis not present

## 2019-07-10 DIAGNOSIS — M6281 Muscle weakness (generalized): Secondary | ICD-10-CM | POA: Diagnosis not present

## 2019-07-10 DIAGNOSIS — R2689 Other abnormalities of gait and mobility: Secondary | ICD-10-CM | POA: Diagnosis not present

## 2019-07-10 NOTE — Therapy (Signed)
[address] [place], [place], [address] Phone: [phone]   Fax:  [phone]  Physical Therapy Treatment  Patient Details  Name: [name] [name] MRN: [id number] Date of Birth: 1951-04-13 Referring Provider (PT): Howard Pouch   Encounter Date: 07/10/2019  PT End of Session - 07/10/19 1551    Visit Number  5    Number of Visits  16    Date for PT Re-Evaluation  08/16/19    PT Start Time  [id number]    PT Stop Time  1600    PT Time Calculation (min)  45 min    Activity Tolerance  Patient tolerated treatment well    Behavior During Therapy  [facility] for tasks assessed/performed       Past Medical History:  Diagnosis Date  . Anxiety   . Arthritis    knees  . Breast cancer ([place]) 1999  . CHF (congestive heart failure) ([place])    Related to severe mitral regurgitation, April, 2013  . 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 Assess by pulmonary team in the hospital April, 2013  . Ejection fraction    EF 60%, echo, April, 2013, with severe MR before mitral valve replacement  . Herpes   . Hypothyroidism   . IBS (irritable bowel syndrome)   . Mitral valve regurgitation    Mitral valve replacement April, 2013, Mitral valve prolapse  . Multiple sclerosis ([place])   . Neurogenic bladder   . Osteoporosis   . Ovarian cyst   . Paroxysmal atrial fibrillation (HCC)    Rapid atrial fibrillation in-hospital, Rapid cardioversion,  before mitral valve surgery  . Pulmonary hypertension ([place])    Echo, April, 2013, before mitral valve surgery  . S/P Maze operation for atrial fibrillation 01/26/2012   Complete biatrial lesion set using cryothermy via right mini thoracotomy  . S/P mitral valve replacement 01/26/2012   81mm Sorin [employer] Optiform mechanical prosthesis via right mini thoracotomy  . Warfarin anticoagulation    Mechanical mitral prosthesis, April, 20136    Past Surgical History:   Procedure Laterality Date  . BREAST LUMPECTOMY Right 1999   with sent.node, and axillary dissection (20)  . CHEST TUBE INSERTION  01/26/2012   Procedure: CHEST TUBE INSERTION;  Surgeon: Rexene Alberts, MD;  Location: [place];  Service: Open Heart Surgery;  Laterality: Left;  . COLONOSCOPY  2010   "normal"  . CYSTOSCOPY  1992  . LAPAROSCOPIC OVARIAN CYSTECTOMY  1978   urethral stricture repair  . LEFT AND RIGHT HEART CATHETERIZATION WITH CORONARY ANGIOGRAM N/A 01/20/2012   Procedure: LEFT AND RIGHT HEART CATHETERIZATION WITH CORONARY ANGIOGRAM;  Surgeon: Burnell Blanks, MD;  Location: [facility] CATH LAB;  Service: Cardiovascular;  Laterality: N/A;  . LYMPHADENECTOMY    . MAZE  01/26/2012   Procedure: MAZE;  Surgeon: Rexene Alberts, MD;  Location: [facility];  Service: Open Heart Surgery;  Laterality: N/A;  . MITRAL VALVE REPLACEMENT  01/26/2012   Procedure: MINIMALLY INVASIVE MITRAL VALVE (MV) REPLACEMENT;  Surgeon: Rexene Alberts, MD;  Location: [place];  Service: Open Heart Surgery;  Laterality: Right;  . TEE WITHOUT CARDIOVERSION  01/19/2012   Procedure: TRANSESOPHAGEAL ECHOCARDIOGRAM (TEE);  Surgeon: Peter M Martinique, MD;  Location: [facility] ENDOSCOPY;  Service: Cardiovascular;  Laterality: N/A;  . TONSILLECTOMY  1970  . [place]  . WRIST SURGERY Right 2012    There were no vitals filed for  this visit.  Subjective Assessment - 07/10/19 1521    Subjective  "Im ok"    Pertinent History  COPD, HTN, Osteoporosis, mitral valve replacement 2018, breast CA, arthritis bil knees    Patient Stated Goals  to walk independently    Currently in Pain?  No/denies    Pain Location  Knee    Pain Descriptors / Indicators  Sore                       [facility] Adult PT Treatment/Exercise - 07/10/19 0001      Ambulation/Gait   Ambulation/Gait  Yes    Ambulation/Gait Assistance  6: Modified independent (Device/Increase time)    Ambulation Distance (Feet)  200 Feet    Assistive  device  4-wheeled walker    Gait Pattern  Decreased stride length;Decreased step length - left;Decreased hip/knee flexion - right;Decreased hip/knee flexion - left;Decreased dorsiflexion - right;Decreased dorsiflexion - left;Right flexed knee in stance;Left flexed knee in stance    Ambulation Surface  Level;Unlevel;Outdoor;Indoor;Paved    Gait Comments  from clinic to car: cues to increase step and stride length and stand upright in walker      High Level Balance   High Level Balance Activities  Side stepping    High Level Balance Comments  HHA x2      Lumbar Exercises: Aerobic   Nustep  lvl 3 x 6 min      Lumbar Exercises: Standing   Row  Strengthening;Theraband;10 reps    Theraband Level (Row)  Level 2 (Red)    Shoulder Extension  Strengthening;Both;20 reps;Theraband    Theraband Level (Shoulder Extension)  Level 2 (Red)      Lumbar Exercises: Seated   Sit to Stand  5 reps   airex in blue chair x3     Knee/Hip Exercises: Standing   Other Standing Knee Exercises  Standing march 3lb with rolator 2x10      Knee/Hip Exercises: Seated   Long Arc Quad  Both;Strengthening;2 sets;15 reps    Long Arc Quad Weight  3 lbs.    Marching  Strengthening;Both;2 sets;10 reps    Marching Weights  3 lbs.    Hamstring Curl  Strengthening;2 sets;15 reps    Hamstring Limitations  green               PT Short Term Goals - 06/21/19 1504      PT SHORT TERM GOAL #1   Title  Independent with intial  HEP for strengthening and flexibility to increase function.    Time  3    Period  Weeks    Status  New    Target Date  07/12/19      PT SHORT TERM GOAL #2   Title  Patient to decrease 5x sit to stand time to 15 seconds or less to decrease fall risk.    Time  4    Period  Weeks    Status  New    Target Date  07/19/19        PT Long Term Goals - 06/21/19 1505      PT LONG TERM GOAL #1   Title  Independent with HEP for strengthening and flexibility to increase function.    Time  8     Period  Weeks    Status  New    Target Date  08/16/19      PT LONG TERM GOAL #2   Title  Pt will amb. 500' over  even/uneven terrain with LRAD at MOD I level to improve functional mobility.    Time  8    Period  Weeks    Status  New      PT LONG TERM GOAL #3   Title  Pt will amb. 150' with SPC at MOD I level (indoors) in order to safely amb. at home.     Time  8    Period  Weeks    Status  New      PT LONG TERM GOAL #4   Title  Patient able to climb stairs with a reciprocal gait pattern and BUE support    Time  8    Period  Weeks    Status  New      PT LONG TERM GOAL #5   Title  Patient will demostrate reduced risk of falling reflected by incr Berg Balance Assessment to 39/56    Time  8    Period  Weeks    Status  New            Plan - 07/10/19 1552    Clinical Impression Statement  Pt did well overall. She appeared to be more fatigues than last treatment. 3lb cuff was really taxing for pat with LAQ and marching. Cues need to fully extend her LLE with LAQ. No reports of pain throughout session. Pt tends to drag LLE when side stepping to her R.    Comorbidities  COPD, HTN, Osteoporosis, mitral valve replacement 2018, breast CA, arthritis bil knees    Stability/Clinical Decision Making  Evolving/Moderate complexity    Rehab Potential  Good    PT Frequency  2x / week    PT Duration  8 weeks    PT Treatment/Interventions  ADLs/Self Care Home Management;Gait training;Stair training;Therapeutic activities;Therapeutic exercise;Balance training;Neuromuscular re-education;Patient/family education;Manual techniques    PT Next Visit Plan  Focus on equal WB and keeping head in neutral; gait, balance, LE strengthening       Patient will benefit from skilled therapeutic intervention in order to improve the following deficits and impairments:  Decreased mobility, Postural dysfunction, Decreased strength, Impaired flexibility, Difficulty walking, Decreased balance  Visit  Diagnosis: Other abnormalities of gait and mobility     Problem List Patient Active Problem List   Diagnosis Date Noted  . Osteoporosis 07/05/2019  . Nonischemic cardiomyopathy ([place]) 07/20/2018  . Chest pain 07/20/2018  . Scoliosis 11/20/2016  . Long term current use of anticoagulant 09/18/2016  . Compression fracture of lumbar spine, non-traumatic, sequela 08/11/2016  . Multiple falls 08/11/2016  . At high risk for injury related to fall 08/11/2016  . Encounter for therapeutic drug monitoring 11/27/2013  . Hypothyroidism   . CHF (congestive heart failure) ([place])   . Paroxysmal atrial fibrillation (HCC)   . Arthritis   . Neurogenic bladder   . Warfarin anticoagulation   . First degree heart block 01/29/2012  . 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 S/P Maze operation for atrial fibrillation 01/26/2012  . Anxiety 01/18/2012  . Multiple sclerosis ([place]) 01/19/2007    Scot Jun 07/10/2019, 3:54 PM  [address] [place], [place], [address] Phone: [phone]   Fax:  [phone]  Name: [name] [name] MRN: [id number] Date of Birth: October 13, 1951

## 2019-07-11 ENCOUNTER — Encounter: Payer: Medicare Other | Admitting: Physical Therapy

## 2019-07-12 ENCOUNTER — Encounter: Payer: Self-pay | Admitting: Physical Therapy

## 2019-07-12 ENCOUNTER — Ambulatory Visit: Payer: Medicare Other | Admitting: Physical Therapy

## 2019-07-12 ENCOUNTER — Other Ambulatory Visit: Payer: Self-pay

## 2019-07-12 DIAGNOSIS — M6281 Muscle weakness (generalized): Secondary | ICD-10-CM

## 2019-07-12 DIAGNOSIS — R2689 Other abnormalities of gait and mobility: Secondary | ICD-10-CM

## 2019-07-12 DIAGNOSIS — R2681 Unsteadiness on feet: Secondary | ICD-10-CM

## 2019-07-12 NOTE — Therapy (Signed)
[place] [address] [place], [place], [address] Phone: [phone]   Fax:  [phone]  Physical Therapy Treatment  Patient Details  Name: [name] [name] MRN: [id number] Date of Birth: [birth year] Referring Provider (PT): Howard Pouch   Encounter Date: 07/12/2019  PT End of Session - 07/12/19 1527    Visit Number  6    Date for PT Re-Evaluation  08/16/19    PT Start Time  1400    PT Stop Time  1445    PT Time Calculation (min)  45 min    Activity Tolerance  Patient tolerated treatment well    Behavior During Therapy  [facility] for tasks assessed/performed       Past Medical History:  Diagnosis Date  . Anxiety   . Arthritis    knees  . Breast cancer ([place]) 1999  . CHF (congestive heart failure) ([place])    Related to severe mitral regurgitation, April, 2013  . 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 Assess by pulmonary team in the hospital April, 2013  . Ejection fraction    EF 60%, echo, April, 2013, with severe MR before mitral valve replacement  . Herpes   . Hypothyroidism   . IBS (irritable bowel syndrome)   . Mitral valve regurgitation    Mitral valve replacement April, 2013, Mitral valve prolapse  . Multiple sclerosis ([place])   . Neurogenic bladder   . Osteoporosis   . Ovarian cyst   . Paroxysmal atrial fibrillation (HCC)    Rapid atrial fibrillation in-hospital, Rapid cardioversion,  before mitral valve surgery  . Pulmonary hypertension ([employer])    Echo, April, 2013, before mitral valve surgery  . S/P Maze operation for atrial fibrillation 01/26/2012   Complete biatrial lesion set using cryothermy via right mini thoracotomy  . S/P mitral valve replacement 01/26/2012   53mm Sorin [employer] Optiform mechanical prosthesis via right mini thoracotomy  . Warfarin anticoagulation    Mechanical mitral prosthesis, April, 20136    Past Surgical History:  Procedure Laterality  Date  . BREAST LUMPECTOMY Right 1999   with sent.node, and axillary dissection (20)  . CHEST TUBE INSERTION  01/26/2012   Procedure: CHEST TUBE INSERTION;  Surgeon: Rexene Alberts, MD;  Location: [place];  Service: Open Heart Surgery;  Laterality: Left;  . COLONOSCOPY  2010   "normal"  . CYSTOSCOPY  1992  . LAPAROSCOPIC OVARIAN CYSTECTOMY  1978   urethral stricture repair  . LEFT AND RIGHT HEART CATHETERIZATION WITH CORONARY ANGIOGRAM N/A 01/20/2012   Procedure: LEFT AND RIGHT HEART CATHETERIZATION WITH CORONARY ANGIOGRAM;  Surgeon: Burnell Blanks, MD;  Location: [facility] CATH LAB;  Service: Cardiovascular;  Laterality: N/A;  . LYMPHADENECTOMY    . MAZE  01/26/2012   Procedure: MAZE;  Surgeon: Rexene Alberts, MD;  Location: [place];  Service: Open Heart Surgery;  Laterality: N/A;  . MITRAL VALVE REPLACEMENT  01/26/2012   Procedure: MINIMALLY INVASIVE MITRAL VALVE (MV) REPLACEMENT;  Surgeon: Rexene Alberts, MD;  Location: [place];  Service: Open Heart Surgery;  Laterality: Right;  . TEE WITHOUT CARDIOVERSION  01/19/2012   Procedure: TRANSESOPHAGEAL ECHOCARDIOGRAM (TEE);  Surgeon: Peter M Martinique, MD;  Location: [facility] ENDOSCOPY;  Service: Cardiovascular;  Laterality: N/A;  . TONSILLECTOMY  1970  . [place]  . WRIST SURGERY Right 2012    There were no vitals filed for this visit.  Subjective Assessment - 07/12/19 1405  Subjective  Patient reports that her knees were very sore after the last treatment    Currently in Pain?  Yes    Pain Score  2     Pain Location  Knee    Pain Orientation  Right;Left                       [facility] Adult PT Treatment/Exercise - 07/12/19 0001      Ambulation/Gait   Gait Comments  gait with HHA a light pull to get her moving faster and a lot of cues for big and smooth steps, also needing cues to really pick up feet and not shuffle      High Level Balance   High Level Balance Activities  Side stepping;Backward walking    High  Level Balance Comments  standing ball toss and reaching, working on balance and core      Lumbar Exercises: Aerobic   Nustep  lvl 4 x 6 min      Lumbar Exercises: Standing   Other Standing Lumbar Exercises  use of the pull up bar to stretch her out and try to gett better posture    Other Standing Lumbar Exercises  also with her sitting some passive thoracic extension to open up chest and help with posture      Lumbar Exercises: Seated   Other Seated Lumbar Exercises  rolling weighted ball to her, she picks up      Knee/Hip Exercises: Machines for Strengthening   Cybex Knee Extension  5# x 5, no weight 2x 10    Cybex Knee Flexion  20# x10, 15# x 10               PT Short Term Goals - 07/12/19 1607      PT SHORT TERM GOAL #1   Title  Independent with intial  HEP for strengthening and flexibility to increase function.    Status  On-going        PT Long Term Goals - 07/12/19 1607      PT LONG TERM GOAL #1   Title  Independent with HEP for strengthening and flexibility to increase function.    Status  On-going      PT LONG TERM GOAL #2   Title  Pt will amb. 500' over even/uneven terrain with LRAD at MOD I level to improve functional mobility.    Status  On-going            Plan - 07/12/19 1527    Clinical Impression Statement  Patient needs a lot of cues to stand up straight and to take big steps, she really has difficulty with walking and trying to talk or negotiate things, she tends to really shuffle and stutter step    PT Next Visit Plan  continue to work on strength, function, balance, posture    Consulted and Agree with Plan of Care  Patient       Patient will benefit from skilled therapeutic intervention in order to improve the following deficits and impairments:  Decreased mobility, Postural dysfunction, Decreased strength, Impaired flexibility, Difficulty walking, Decreased balance  Visit Diagnosis: Other abnormalities of gait and mobility  Muscle  weakness (generalized)  Unsteadiness on feet     Problem List Patient Active Problem List   Diagnosis Date Noted  . Osteoporosis 07/05/2019  . Nonischemic cardiomyopathy ([place]) 07/20/2018  . Chest pain 07/20/2018  . Scoliosis 11/20/2016  . Long term current use of anticoagulant 09/18/2016  .  Compression fracture of lumbar spine, non-traumatic, sequela 08/11/2016  . Multiple falls 08/11/2016  . At high risk for injury related to fall 08/11/2016  . Encounter for therapeutic drug monitoring 11/27/2013  . Hypothyroidism   . CHF (congestive heart failure) ([place])   . Paroxysmal atrial fibrillation (HCC)   . Arthritis   . Neurogenic bladder   . Warfarin anticoagulation   . First degree heart block 01/29/2012  . 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 S/P Maze operation for atrial fibrillation 01/26/2012  . Anxiety 01/18/2012  . Multiple sclerosis ([place]) 01/19/2007    Sumner Boast., PT 07/12/2019, 4:08 PM  [place] [address] [place], [place], [address] Phone: [phone]   Fax:  [phone]  Name: [name] [name] MRN: [id number] Date of Birth: 02-23-1951

## 2019-07-17 ENCOUNTER — Ambulatory Visit: Payer: Medicare Other | Admitting: Physical Therapy

## 2019-07-17 ENCOUNTER — Encounter: Payer: Self-pay | Admitting: Physical Therapy

## 2019-07-17 ENCOUNTER — Other Ambulatory Visit: Payer: Self-pay

## 2019-07-17 DIAGNOSIS — M6281 Muscle weakness (generalized): Secondary | ICD-10-CM

## 2019-07-17 DIAGNOSIS — R2681 Unsteadiness on feet: Secondary | ICD-10-CM | POA: Diagnosis not present

## 2019-07-17 DIAGNOSIS — R2689 Other abnormalities of gait and mobility: Secondary | ICD-10-CM

## 2019-07-17 NOTE — Therapy (Signed)
[place], [place], [address] Phone: [phone]   Fax:  [phone]  Physical Therapy Treatment  Patient Details  Name: CASHA SALEN MRN: [id number] Date of Birth: [birth year] Referring Provider (PT): Howard Pouch   Encounter Date: 07/17/2019  PT End of Session - 07/17/19 1602    Visit Number  7    Number of Visits  16    Date for PT Re-Evaluation  08/16/19    PT Start Time  [id number]    PT Stop Time  1601    PT Time Calculation (min)  46 min    Activity Tolerance  Patient tolerated treatment well    Behavior During Therapy  [facility] for tasks assessed/performed       Past Medical History:  Diagnosis Date  . Anxiety   . Arthritis    knees  . Breast cancer ([place]) 1999  . CHF (congestive heart failure) ([place])    Related to severe mitral regurgitation, April, 2013  . 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 Assess by pulmonary team in the hospital April, 2013  . Ejection fraction    EF 60%, echo, April, 2013, with severe MR before mitral valve replacement  . Herpes   . Hypothyroidism   . IBS (irritable bowel syndrome)   . Mitral valve regurgitation    Mitral valve replacement April, 2013, Mitral valve prolapse  . Multiple sclerosis ([place])   . Neurogenic bladder   . Osteoporosis   . Ovarian cyst   . Paroxysmal atrial fibrillation (HCC)    Rapid atrial fibrillation in-hospital, Rapid cardioversion,  before mitral valve surgery  . Pulmonary hypertension ([place])    Echo, April, 2013, before mitral valve surgery  . S/P Maze operation for atrial fibrillation 01/26/2012   Complete biatrial lesion set using cryothermy via right mini thoracotomy  . S/P mitral valve replacement 01/26/2012   12mm Sorin [employer] Optiform mechanical prosthesis via right mini thoracotomy  . Warfarin anticoagulation    Mechanical mitral prosthesis, April, 20136    Past Surgical History:   Procedure Laterality Date  . BREAST LUMPECTOMY Right 1999   with sent.node, and axillary dissection (20)  . CHEST TUBE INSERTION  01/26/2012   Procedure: CHEST TUBE INSERTION;  Surgeon: Rexene Alberts, MD;  Location: [place];  Service: Open Heart Surgery;  Laterality: Left;  . COLONOSCOPY  2010   "normal"  . CYSTOSCOPY  1992  . LAPAROSCOPIC OVARIAN CYSTECTOMY  1978   urethral stricture repair  . LEFT AND RIGHT HEART CATHETERIZATION WITH CORONARY ANGIOGRAM N/A 01/20/2012   Procedure: LEFT AND RIGHT HEART CATHETERIZATION WITH CORONARY ANGIOGRAM;  Surgeon: Burnell Blanks, MD;  Location: [facility] CATH LAB;  Service: Cardiovascular;  Laterality: N/A;  . LYMPHADENECTOMY    . MAZE  01/26/2012   Procedure: MAZE;  Surgeon: Rexene Alberts, MD;  Location: [place];  Service: Open Heart Surgery;  Laterality: N/A;  . MITRAL VALVE REPLACEMENT  01/26/2012   Procedure: MINIMALLY INVASIVE MITRAL VALVE (MV) REPLACEMENT;  Surgeon: Rexene Alberts, MD;  Location: [place];  Service: Open Heart Surgery;  Laterality: Right;  . TEE WITHOUT CARDIOVERSION  01/19/2012   Procedure: TRANSESOPHAGEAL ECHOCARDIOGRAM (TEE);  Surgeon: Peter M Martinique, MD;  Location: [facility] ENDOSCOPY;  Service: Cardiovascular;  Laterality: N/A;  . TONSILLECTOMY  1970  . [place]  . WRIST SURGERY Right 2012    There were no vitals filed for  this visit.  Subjective Assessment - 07/17/19 1520    Subjective  "Its going, I have been doing the stand up sit down thing"    Currently in Pain?  Yes    Pain Score  1     Pain Location  Knee    Pain Orientation  Left                       [facility] Adult PT Treatment/Exercise - 07/17/19 0001      Ambulation/Gait   Ambulation/Gait  Yes    Ambulation/Gait Assistance  6: Modified independent (Device/Increase time)    Ambulation Distance (Feet)  200 Feet    Assistive device  4-wheeled walker    Gait Pattern  Decreased stride length;Decreased step length - left;Decreased  hip/knee flexion - right;Decreased hip/knee flexion - left;Decreased dorsiflexion - right;Decreased dorsiflexion - left;Right flexed knee in stance;Left flexed knee in stance    Ambulation Surface  Level;Unlevel;Indoor;Outdoor;Paved      High Level Balance   High Level Balance Activities  Side stepping;Backward walking   HHA X2   High Level Balance Comments  standing ball toss and reaching, working on balance and core      Lumbar Exercises: Aerobic   Nustep  lvl 4 x 6 min      Lumbar Exercises: Standing   Other Standing Lumbar Exercises  Alt 4in box taps 2x10 each      Lumbar Exercises: Seated   Other Seated Lumbar Exercises  rolling weighted ball to her, she picks up      Knee/Hip Exercises: Seated   Long Arc Quad  Both;Strengthening;2 sets;10 reps   1 sec hold   Long Arc Quad Weight  3 lbs.    Hamstring Curl  Strengthening;2 sets;15 reps    Hamstring Limitations  green               PT Short Term Goals - 07/12/19 1607      PT SHORT TERM GOAL #1   Title  Independent with intial  HEP for strengthening and flexibility to increase function.    Status  On-going        PT Long Term Goals - 07/12/19 1607      PT LONG TERM GOAL #1   Title  Independent with HEP for strengthening and flexibility to increase function.    Status  On-going      PT LONG TERM GOAL #2   Title  Pt will amb. 500' over even/uneven terrain with LRAD at MOD I level to improve functional mobility.    Status  On-going            Plan - 07/17/19 1603    Clinical Impression Statement  Postural cues needed throughout session to stand erect. LE weakness present with LAQ and hamstring curls. HHA x2 needed for side stepping and backwards walking. Some LOB with alt box taps with HHA x1, Pt has some difficulty bringing LLE straight back off box with box taps, she tends to bring her foot off to the side.    Comorbidities  COPD, HTN, Osteoporosis, mitral valve replacement 2018, breast CA, arthritis bil  knees    Examination-Activity Limitations  Locomotion Level;Transfers;Stairs;Stand    Examination-Participation Restrictions  Community Activity    Stability/Clinical Decision Making  Evolving/Moderate complexity    Rehab Potential  Good    PT Frequency  2x / week    PT Duration  8 weeks    PT Treatment/Interventions  ADLs/Self  Care Home Management;Gait training;Stair training;Therapeutic activities;Therapeutic exercise;Balance training;Neuromuscular re-education;Patient/family education;Manual techniques    PT Next Visit Plan  continue to work on strength, function, balance, posture       Patient will benefit from skilled therapeutic intervention in order to improve the following deficits and impairments:  Decreased mobility, Postural dysfunction, Decreased strength, Impaired flexibility, Difficulty walking, Decreased balance  Visit Diagnosis: Other abnormalities of gait and mobility  Muscle weakness (generalized)  Unsteadiness on feet     Problem List Patient Active Problem List   Diagnosis Date Noted  . Osteoporosis 07/05/2019  . Nonischemic cardiomyopathy ([place]) 07/20/2018  . Chest pain 07/20/2018  . Scoliosis 11/20/2016  . Long term current use of anticoagulant 09/18/2016  . Compression fracture of lumbar spine, non-traumatic, sequela 08/11/2016  . Multiple falls 08/11/2016  . At high risk for injury related to fall 08/11/2016  . Encounter for therapeutic drug monitoring 11/27/2013  . Hypothyroidism   . CHF (congestive heart failure) ([place])   . Paroxysmal atrial fibrillation (HCC)   . Arthritis   . Neurogenic bladder   . Warfarin anticoagulation   . First degree heart block 01/29/2012  . 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 S/P Maze operation for atrial fibrillation 01/26/2012  . Anxiety 01/18/2012  . Multiple sclerosis ([place]) 01/19/2007    Scot Jun, PTA 07/17/2019, 4:54 PM  [place] [address] [place], [place], [address] Phone: [phone]   Fax:  [phone]  Name: RODERICKA WERKING MRN: [id number] Date of Birth: 12/22/1950

## 2019-07-18 ENCOUNTER — Other Ambulatory Visit: Payer: Self-pay

## 2019-07-18 ENCOUNTER — Ambulatory Visit (INDEPENDENT_AMBULATORY_CARE_PROVIDER_SITE_OTHER): Payer: Medicare Other | Admitting: *Deleted

## 2019-07-18 DIAGNOSIS — I509 Heart failure, unspecified: Secondary | ICD-10-CM | POA: Diagnosis not present

## 2019-07-18 DIAGNOSIS — Z5181 Encounter for therapeutic drug level monitoring: Secondary | ICD-10-CM | POA: Diagnosis not present

## 2019-07-18 DIAGNOSIS — Z8679 Personal history of other diseases of the circulatory system: Secondary | ICD-10-CM | POA: Diagnosis not present

## 2019-07-18 DIAGNOSIS — Z952 Presence of prosthetic heart valve: Secondary | ICD-10-CM

## 2019-07-18 DIAGNOSIS — Z9889 Other specified postprocedural states: Secondary | ICD-10-CM | POA: Diagnosis not present

## 2019-07-18 LAB — POCT INR: INR: 2 (ref 2.0–3.0)

## 2019-07-18 NOTE — Patient Instructions (Addendum)
Description   Today take 1.5 tablets then continue taking 1 tablet everyday except 1/2 tablet on Thursdays.  Recheck in 3 weeks. Call with any new medications or procedures [phone]

## 2019-07-20 ENCOUNTER — Ambulatory Visit: Payer: Medicare Other | Admitting: Physical Therapy

## 2019-07-24 ENCOUNTER — Other Ambulatory Visit: Payer: Self-pay

## 2019-07-24 ENCOUNTER — Encounter: Payer: Self-pay | Admitting: Physical Therapy

## 2019-07-24 ENCOUNTER — Ambulatory Visit: Payer: Medicare Other | Attending: Family Medicine | Admitting: Physical Therapy

## 2019-07-24 DIAGNOSIS — R2681 Unsteadiness on feet: Secondary | ICD-10-CM

## 2019-07-24 DIAGNOSIS — R2689 Other abnormalities of gait and mobility: Secondary | ICD-10-CM

## 2019-07-24 DIAGNOSIS — M6281 Muscle weakness (generalized): Secondary | ICD-10-CM

## 2019-07-24 NOTE — Therapy (Signed)
[place] [address] [place], [place], [address] Phone: [phone]   Fax:  [phone]  Physical Therapy Treatment  Patient Details  Name: [name] [name] MRN: [id number] Date of Birth: 01/18/1951 Referring Provider (PT): Howard Pouch   Encounter Date: 07/24/2019  PT End of Session - 07/24/19 1621    Visit Number  8    Number of Visits  16    Date for PT Re-Evaluation  08/16/19    PT Start Time  1521    PT Stop Time  1600    PT Time Calculation (min)  39 min    Activity Tolerance  Patient tolerated treatment well    Behavior During Therapy  [facility] for tasks assessed/performed       Past Medical History:  Diagnosis Date  . Anxiety   . Arthritis    knees  . Breast cancer ([place]) 1999  . CHF (congestive heart failure) ([place])    Related to severe mitral regurgitation, April, 2013  . 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 Assess by pulmonary team in the hospital April, 2013  . Ejection fraction    EF 60%, echo, April, 2013, with severe MR before mitral valve replacement  . Herpes   . Hypothyroidism   . IBS (irritable bowel syndrome)   . Mitral valve regurgitation    Mitral valve replacement April, 2013, Mitral valve prolapse  . Multiple sclerosis ([place])   . Neurogenic bladder   . Osteoporosis   . Ovarian cyst   . Paroxysmal atrial fibrillation (HCC)    Rapid atrial fibrillation in-hospital, Rapid cardioversion,  before mitral valve surgery  . Pulmonary hypertension ([place])    Echo, April, 2013, before mitral valve surgery  . S/P Maze operation for atrial fibrillation 01/26/2012   Complete biatrial lesion set using cryothermy via right mini thoracotomy  . S/P mitral valve replacement 01/26/2012   32mm Sorin [employer] Optiform mechanical prosthesis via right mini thoracotomy  . Warfarin anticoagulation    Mechanical mitral prosthesis, April, 20136    Past Surgical History:   Procedure Laterality Date  . BREAST LUMPECTOMY Right 1999   with sent.node, and axillary dissection (20)  . CHEST TUBE INSERTION  01/26/2012   Procedure: CHEST TUBE INSERTION;  Surgeon: Rexene Alberts, MD;  Location: [place];  Service: Open Heart Surgery;  Laterality: Left;  . COLONOSCOPY  2010   "normal"  . CYSTOSCOPY  1992  . LAPAROSCOPIC OVARIAN CYSTECTOMY  1978   urethral stricture repair  . LEFT AND RIGHT HEART CATHETERIZATION WITH CORONARY ANGIOGRAM N/A 01/20/2012   Procedure: LEFT AND RIGHT HEART CATHETERIZATION WITH CORONARY ANGIOGRAM;  Surgeon: Burnell Blanks, MD;  Location: [facility] CATH LAB;  Service: Cardiovascular;  Laterality: N/A;  . LYMPHADENECTOMY    . MAZE  01/26/2012   Procedure: MAZE;  Surgeon: Rexene Alberts, MD;  Location: [place];  Service: Open Heart Surgery;  Laterality: N/A;  . MITRAL VALVE REPLACEMENT  01/26/2012   Procedure: MINIMALLY INVASIVE MITRAL VALVE (MV) REPLACEMENT;  Surgeon: Rexene Alberts, MD;  Location: [place];  Service: Open Heart Surgery;  Laterality: Right;  . TEE WITHOUT CARDIOVERSION  01/19/2012   Procedure: TRANSESOPHAGEAL ECHOCARDIOGRAM (TEE);  Surgeon: Peter M Martinique, MD;  Location: [facility] ENDOSCOPY;  Service: Cardiovascular;  Laterality: N/A;  . TONSILLECTOMY  1970  . [place]  . WRIST SURGERY Right 2012    There were no vitals filed for  this visit.  Subjective Assessment - 07/24/19 1522    Subjective  "I am doing pretty well", Back feels weak today because she was up on her feet yesterday    Pertinent History  COPD, HTN, Osteoporosis, mitral valve replacement 2018, breast CA, arthritis bil knees    Currently in Pain?  Yes    Pain Score  2     Pain Location  Back                       [facility] Adult PT Treatment/Exercise - 07/24/19 0001      Ambulation/Gait   Ambulation/Gait  Yes    Ambulation/Gait Assistance  6: Modified independent (Device/Increase time)    Ambulation Distance (Feet)  200 Feet    Assistive  device  4-wheeled walker    Gait Pattern  Decreased stride length;Decreased step length - left;Decreased hip/knee flexion - right;Decreased hip/knee flexion - left;Decreased dorsiflexion - right;Decreased dorsiflexion - left;Right flexed knee in stance;Left flexed knee in stance    Ambulation Surface  Level;Unlevel;Indoor;Outdoor;Paved    Stairs  Yes    Stairs Assistance  5: Supervision;4: Min guard    Stair Management Technique  One rail Right;Two rails;Step to pattern    Number of Stairs  12    Height of Stairs  6    Gait Comments  from clinic to car: cues to increase step and stride length and stand upright in walker      Lumbar Exercises: Aerobic   Nustep  lvl 4 x 6 min      Lumbar Exercises: Standing   Row  Strengthening;Theraband;10 reps   x2   Theraband Level (Row)  Level 2 (Red)      Knee/Hip Exercises: Machines for Strengthening   Cybex Knee Extension  5# x 5, no weight 2x 10    Cybex Knee Flexion  20lb 3x10     Sit to stand from elevated UBE seat without UE assist 2x10          PT Short Term Goals - 07/12/19 1607      PT SHORT TERM GOAL #1   Title  Independent with intial  HEP for strengthening and flexibility to increase function.    Status  On-going        PT Long Term Goals - 07/24/19 1530      PT LONG TERM GOAL #1   Title  Independent with HEP for strengthening and flexibility to increase function.    Status  Achieved      PT LONG TERM GOAL #2   Title  Pt will amb. 500' over even/uneven terrain with LRAD at MOD I level to improve functional mobility.    Status  On-going      PT LONG TERM GOAL #4   Title  Patient able to climb stairs with a reciprocal gait pattern and BUE support    Status  On-going            Plan - 07/24/19 1621    Clinical Impression Statement  Pt was ~ 6 minutes late for PT session. Progressed to stair negotiation. Pt utilizes 2 rails descending with a step two pattern leading with her RLE. One rail R utilized ascending  stairs step with a step to pattern. Pt tends to bring up following foot prematurely not pushing through the leading LE regardless off the LE she used. LE weakness exposed with leg extensions only completing the interventions without weight. Pt did well with sit  to stands from elevated UBE without UE assist.    Comorbidities  COPD, HTN, Osteoporosis, mitral valve replacement 2018, breast CA, arthritis bil knees    Stability/Clinical Decision Making  Evolving/Moderate complexity    Rehab Potential  Good    PT Treatment/Interventions  ADLs/Self Care Home Management;Gait training;Stair training;Therapeutic activities;Therapeutic exercise;Balance training;Neuromuscular re-education;Patient/family education;Manual techniques    PT Next Visit Plan  continue to work on strength, function, balance, posture       Patient will benefit from skilled therapeutic intervention in order to improve the following deficits and impairments:  Decreased mobility, Postural dysfunction, Decreased strength, Impaired flexibility, Difficulty walking, Decreased balance  Visit Diagnosis: Muscle weakness (generalized)  Unsteadiness on feet  Other abnormalities of gait and mobility     Problem List Patient Active Problem List   Diagnosis Date Noted  . Osteoporosis 07/05/2019  . Nonischemic cardiomyopathy ([place]) 07/20/2018  . Chest pain 07/20/2018  . Scoliosis 11/20/2016  . Long term current use of anticoagulant 09/18/2016  . Compression fracture of lumbar spine, non-traumatic, sequela 08/11/2016  . Multiple falls 08/11/2016  . At high risk for injury related to fall 08/11/2016  . Encounter for therapeutic drug monitoring 11/27/2013  . Hypothyroidism   . CHF (congestive heart failure) ([place])   . Paroxysmal atrial fibrillation (HCC)   . Arthritis   . Neurogenic bladder   . Warfarin anticoagulation   . First degree heart block 01/29/2012  . 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 S/P Maze operation for atrial  fibrillation 01/26/2012  . Anxiety 01/18/2012  . Multiple sclerosis ([place]) 01/19/2007    Scot Jun, PTA 07/24/2019, 4:33 PM  [place] [address] [place], [place], [address] Phone: [phone]   Fax:  [phone]  Name: KABELLA SCALLION MRN: [id number] Date of Birth: 1951-07-05

## 2019-07-26 ENCOUNTER — Other Ambulatory Visit: Payer: Self-pay

## 2019-07-26 ENCOUNTER — Ambulatory Visit: Payer: Medicare Other | Admitting: Physical Therapy

## 2019-07-26 ENCOUNTER — Encounter: Payer: Self-pay | Admitting: Physical Therapy

## 2019-07-26 DIAGNOSIS — R2681 Unsteadiness on feet: Secondary | ICD-10-CM | POA: Diagnosis not present

## 2019-07-26 DIAGNOSIS — R2689 Other abnormalities of gait and mobility: Secondary | ICD-10-CM | POA: Diagnosis not present

## 2019-07-26 DIAGNOSIS — M6281 Muscle weakness (generalized): Secondary | ICD-10-CM | POA: Diagnosis not present

## 2019-07-26 NOTE — Therapy (Signed)
[address] [place], [place], [address] Phone: [phone]   Fax:  [phone]  Physical Therapy Treatment  Patient Details  Name: [name] [name] MRN: [id number] Date of Birth: 1951/04/19 Referring Provider (PT): Howard Pouch   Encounter Date: 07/26/2019  PT End of Session - 07/26/19 1515    Visit Number  9    Date for PT Re-Evaluation  08/16/19    PT Start Time  1430    PT Stop Time  1515    PT Time Calculation (min)  45 min       Past Medical History:  Diagnosis Date  . Anxiety   . Arthritis    knees  . Breast cancer ([place]) 1999  . CHF (congestive heart failure) ([place])    Related to severe mitral regurgitation, April, 2013  . 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 Assess by pulmonary team in the hospital April, 2013  . Ejection fraction    EF 60%, echo, April, 2013, with severe MR before mitral valve replacement  . Herpes   . Hypothyroidism   . IBS (irritable bowel syndrome)   . Mitral valve regurgitation    Mitral valve replacement April, 2013, Mitral valve prolapse  . Multiple sclerosis ([place])   . Neurogenic bladder   . Osteoporosis   . Ovarian cyst   . Paroxysmal atrial fibrillation (HCC)    Rapid atrial fibrillation in-hospital, Rapid cardioversion,  before mitral valve surgery  . Pulmonary hypertension ([place])    Echo, April, 2013, before mitral valve surgery  . S/P Maze operation for atrial fibrillation 01/26/2012   Complete biatrial lesion set using cryothermy via right mini thoracotomy  . S/P mitral valve replacement 01/26/2012   58mm Sorin [employer] Optiform mechanical prosthesis via right mini thoracotomy  . Warfarin anticoagulation    Mechanical mitral prosthesis, April, 20136    Past Surgical History:  Procedure Laterality Date  . BREAST LUMPECTOMY Right 1999   with sent.node, and axillary dissection (20)  . CHEST TUBE INSERTION  01/26/2012    Procedure: CHEST TUBE INSERTION;  Surgeon: Rexene Alberts, MD;  Location: [place];  Service: Open Heart Surgery;  Laterality: Left;  . COLONOSCOPY  2010   "normal"  . CYSTOSCOPY  1992  . LAPAROSCOPIC OVARIAN CYSTECTOMY  1978   urethral stricture repair  . LEFT AND RIGHT HEART CATHETERIZATION WITH CORONARY ANGIOGRAM N/A 01/20/2012   Procedure: LEFT AND RIGHT HEART CATHETERIZATION WITH CORONARY ANGIOGRAM;  Surgeon: Burnell Blanks, MD;  Location: [facility] CATH LAB;  Service: Cardiovascular;  Laterality: N/A;  . LYMPHADENECTOMY    . MAZE  01/26/2012   Procedure: MAZE;  Surgeon: Rexene Alberts, MD;  Location: [place];  Service: Open Heart Surgery;  Laterality: N/A;  . MITRAL VALVE REPLACEMENT  01/26/2012   Procedure: MINIMALLY INVASIVE MITRAL VALVE (MV) REPLACEMENT;  Surgeon: Rexene Alberts, MD;  Location: [facility];  Service: Open Heart Surgery;  Laterality: Right;  . TEE WITHOUT CARDIOVERSION  01/19/2012   Procedure: TRANSESOPHAGEAL ECHOCARDIOGRAM (TEE);  Surgeon: Peter M Martinique, MD;  Location: [facility] ENDOSCOPY;  Service: Cardiovascular;  Laterality: N/A;  . TONSILLECTOMY  1970  . [place]  . WRIST SURGERY Right 2012    There were no vitals filed for this visit.  Subjective Assessment - 07/26/19 1431    Subjective  "Doing pretty good." Pt states that she is no pain today.    Currently in  Pain?  No/denies                       [facility] Adult PT Treatment/Exercise - 07/26/19 0001      Ambulation/Gait   Ambulation/Gait  Yes    Ambulation/Gait Assistance  6: Modified independent (Device/Increase time)    Ambulation Distance (Feet)  200 Feet    Assistive device  4-wheeled walker    Gait Pattern  Decreased stride length;Decreased step length - left;Decreased hip/knee flexion - right;Decreased hip/knee flexion - left;Decreased dorsiflexion - right;Decreased dorsiflexion - left;Right flexed knee in stance;Left flexed knee in stance    Ambulation Surface   Unlevel;Level;Indoor;Outdoor;Paved    Gait Comments  from clinic to car: cues to increase step and stride length and stand upright in walker      Lumbar Exercises: Aerobic   Nustep  lvl 4 x 6 min      Lumbar Exercises: Standing   Shoulder Extension  Strengthening;Both;20 reps;Theraband      Lumbar Exercises: Seated   Other Seated Lumbar Exercises  Tband rows & Ext green 2x15       Knee/Hip Exercises: Machines for Strengthening   Cybex Knee Flexion  20lb 3x10      Knee/Hip Exercises: Standing   Other Standing Knee Exercises  Standing march1 hand on rolator 2x10      Knee/Hip Exercises: Seated   Sit to Sand  5 reps;without UE support;3 sets   Elevated UBE               PT Short Term Goals - 07/12/19 1607      PT SHORT TERM GOAL #1   Title  Independent with intial  HEP for strengthening and flexibility to increase function.    Status  On-going        PT Long Term Goals - 07/24/19 1530      PT LONG TERM GOAL #1   Title  Independent with HEP for strengthening and flexibility to increase function.    Status  Achieved      PT LONG TERM GOAL #2   Title  Pt will amb. 500' over even/uneven terrain with LRAD at MOD I level to improve functional mobility.    Status  On-going      PT LONG TERM GOAL #4   Title  Patient able to climb stairs with a reciprocal gait pattern and BUE support    Status  On-going            Plan - 07/26/19 1516    Clinical Impression Statement  Pt was able to complete all of today's interventions. Cues needed to slide to edge of chair and to shift weight forward to do S2S. Pt with some fatigue with standing march. Tactile cues for core engagement needed with standing shoulder extensions.    Comorbidities  COPD, HTN, Osteoporosis, mitral valve replacement 2018, breast CA, arthritis bil knees    Examination-Participation Restrictions  Community Activity    Stability/Clinical Decision Making  Evolving/Moderate complexity    Rehab Potential   Good    PT Frequency  2x / week    PT Duration  8 weeks    PT Treatment/Interventions  ADLs/Self Care Home Management;Gait training;Stair training;Therapeutic activities;Therapeutic exercise;Balance training;Neuromuscular re-education;Patient/family education;Manual techniques    PT Next Visit Plan  continue to work on strength, function, balance, posture       Patient will benefit from skilled therapeutic intervention in order to improve the following deficits and impairments:  Decreased  mobility, Postural dysfunction, Decreased strength, Impaired flexibility, Difficulty walking, Decreased balance  Visit Diagnosis: Muscle weakness (generalized)  Unsteadiness on feet     Problem List Patient Active Problem List   Diagnosis Date Noted  . Osteoporosis 07/05/2019  . Nonischemic cardiomyopathy ([place]) 07/20/2018  . Chest pain 07/20/2018  . Scoliosis 11/20/2016  . Long term current use of anticoagulant 09/18/2016  . Compression fracture of lumbar spine, non-traumatic, sequela 08/11/2016  . Multiple falls 08/11/2016  . At high risk for injury related to fall 08/11/2016  . Encounter for therapeutic drug monitoring 11/27/2013  . Hypothyroidism   . CHF (congestive heart failure) ([place])   . Paroxysmal atrial fibrillation (HCC)   . Arthritis   . Neurogenic bladder   . Warfarin anticoagulation   . First degree heart block 01/29/2012  . 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 S/P Maze operation for atrial fibrillation 01/26/2012  . Anxiety 01/18/2012  . Multiple sclerosis ([place]) 01/19/2007    Scot Jun, PTA 07/26/2019, 3:20 PM  [address] [place], [place], [address] Phone: [phone]   Fax:  [phone]  Name: [name] [name] MRN: [id number] Date of Birth: 1951/04/10

## 2019-07-31 ENCOUNTER — Ambulatory Visit: Payer: Medicare Other | Admitting: Physical Therapy

## 2019-08-01 ENCOUNTER — Ambulatory Visit: Payer: Medicare Other | Admitting: Physical Therapy

## 2019-08-01 ENCOUNTER — Encounter: Payer: Self-pay | Admitting: Physical Therapy

## 2019-08-01 ENCOUNTER — Other Ambulatory Visit: Payer: Self-pay

## 2019-08-01 DIAGNOSIS — R2689 Other abnormalities of gait and mobility: Secondary | ICD-10-CM

## 2019-08-01 DIAGNOSIS — R2681 Unsteadiness on feet: Secondary | ICD-10-CM | POA: Diagnosis not present

## 2019-08-01 DIAGNOSIS — M6281 Muscle weakness (generalized): Secondary | ICD-10-CM

## 2019-08-01 NOTE — Therapy (Addendum)
[address] [place], [place], [address] Phone: [phone]   Fax:  [phone] Progress Note Reporting Period 06/21/2019 to 08/01/19 for the first 10 visits See note below for Objective Data and Assessment of Progress/Goals.      Physical Therapy Treatment  Patient Details  Name: [name] [name] MRN: [id number] Date of Birth: 11-28-1950 Referring Provider (PT): Howard Pouch   Encounter Date: 08/01/2019  PT End of Session - 08/01/19 1342    Activity Tolerance  Patient tolerated treatment well    Behavior During Therapy  [facility] for tasks assessed/performed       Past Medical History:  Diagnosis Date  . Anxiety   . Arthritis    knees  . Breast cancer ([place]) 1999  . CHF (congestive heart failure) ([place])    Related to severe mitral regurgitation, April, 2013  . 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 Assess by pulmonary team in the hospital April, 2013  . Ejection fraction    EF 60%, echo, April, 2013, with severe MR before mitral valve replacement  . Herpes   . Hypothyroidism   . IBS (irritable bowel syndrome)   . Mitral valve regurgitation    Mitral valve replacement April, 2013, Mitral valve prolapse  . Multiple sclerosis ([place])   . Neurogenic bladder   . Osteoporosis   . Ovarian cyst   . Paroxysmal atrial fibrillation (HCC)    Rapid atrial fibrillation in-hospital, Rapid cardioversion,  before mitral valve surgery  . Pulmonary hypertension ([place])    Echo, April, 2013, before mitral valve surgery  . S/P Maze operation for atrial fibrillation 01/26/2012   Complete biatrial lesion set using cryothermy via right mini thoracotomy  . S/P mitral valve replacement 01/26/2012   38mm Sorin [employer] Optiform mechanical prosthesis via right mini thoracotomy  . Warfarin anticoagulation    Mechanical mitral prosthesis, April, 20136    Past Surgical History:  Procedure  Laterality Date  . BREAST LUMPECTOMY Right 1999   with sent.node, and axillary dissection (20)  . CHEST TUBE INSERTION  01/26/2012   Procedure: CHEST TUBE INSERTION;  Surgeon: Rexene Alberts, MD;  Location: [place];  Service: Open Heart Surgery;  Laterality: Left;  . COLONOSCOPY  2010   "normal"  . CYSTOSCOPY  1992  . LAPAROSCOPIC OVARIAN CYSTECTOMY  1978   urethral stricture repair  . LEFT AND RIGHT HEART CATHETERIZATION WITH CORONARY ANGIOGRAM N/A 01/20/2012   Procedure: LEFT AND RIGHT HEART CATHETERIZATION WITH CORONARY ANGIOGRAM;  Surgeon: Burnell Blanks, MD;  Location: [facility] CATH LAB;  Service: Cardiovascular;  Laterality: N/A;  . LYMPHADENECTOMY    . MAZE  01/26/2012   Procedure: MAZE;  Surgeon: Rexene Alberts, MD;  Location: [place];  Service: Open Heart Surgery;  Laterality: N/A;  . MITRAL VALVE REPLACEMENT  01/26/2012   Procedure: MINIMALLY INVASIVE MITRAL VALVE (MV) REPLACEMENT;  Surgeon: Rexene Alberts, MD;  Location: [facility];  Service: Open Heart Surgery;  Laterality: Right;  . TEE WITHOUT CARDIOVERSION  01/19/2012   Procedure: TRANSESOPHAGEAL ECHOCARDIOGRAM (TEE);  Surgeon: Peter M Martinique, MD;  Location: [facility] ENDOSCOPY;  Service: Cardiovascular;  Laterality: N/A;  . TONSILLECTOMY  1970  . [place]  . WRIST SURGERY Right 2012    There were no vitals filed for this visit.  Subjective Assessment - 08/01/19 1304    Subjective  "Pretty good"    Currently in Pain?  No/denies  [facility] PT Assessment - 08/01/19 0001      Berg Balance Test   Sit to Stand  Able to stand  independently using hands    Standing Unsupported  Able to stand safely 2 minutes    Sitting with Back Unsupported but Feet Supported on Floor or Stool  Able to sit safely and securely 2 minutes    Stand to Sit  Controls descent by using hands    Transfers  Able to transfer safely, definite need of hands    Standing Unsupported with Eyes Closed  Able to stand 10 seconds safely    Standing  Unsupported with Feet Together  Needs help to attain position and unable to hold for 15 seconds    From Standing, Reach Forward with Outstretched Arm  Can reach forward >12 cm safely (5")    From Standing Position, Pick up Object from Floor  Able to pick up shoe, needs supervision    From Standing Position, Turn to Look Behind Over each Shoulder  Needs supervision when turning    Turn 360 Degrees  Needs close supervision or verbal cueing    Standing Unsupported, Alternately Place Feet on Step/Stool  Needs assistance to keep from falling or unable to try    Standing Unsupported, One Foot in Front  Able to take small step independently and hold 30 seconds    Standing on One Leg  Unable to try or needs assist to prevent fall    Total Score  31                   [facility] Adult PT Treatment/Exercise - 08/01/19 0001      Ambulation/Gait   Ambulation/Gait  Yes    Ambulation/Gait Assistance  6: Modified independent (Device/Increase time)    Ambulation Distance (Feet)  200 Feet    Assistive device  4-wheeled walker    Gait Pattern  Decreased stride length;Decreased step length - left;Decreased hip/knee flexion - right;Decreased hip/knee flexion - left;Decreased dorsiflexion - right;Decreased dorsiflexion - left;Right flexed knee in stance;Left flexed knee in stance    Ambulation Surface  Level;Unlevel;Indoor;Outdoor;Paved      Lumbar Exercises: Aerobic   Nustep  lvl 4 x 7 min      Lumbar Exercises: Standing   Row  Strengthening;Theraband;15 reps;Limitations    Theraband Level (Row)  Level 3 (Green)    Shoulder Extension  Theraband;15 reps;Strengthening   x2   Theraband Level (Shoulder Extension)  Level 3 (Green)    Other Standing Lumbar Exercises  Standing march with rollator 2x10       Knee/Hip Exercises: Machines for Strengthening   Cybex Knee Extension  5# 3x5    Cybex Knee Flexion  20lb 2x15               PT Short Term Goals - 07/12/19 1607      PT SHORT TERM GOAL  #1   Title  Independent with intial  HEP for strengthening and flexibility to increase function.    Status  On-going        PT Long Term Goals - 07/24/19 1530      PT LONG TERM GOAL #1   Title  Independent with HEP for strengthening and flexibility to increase function.    Status  Achieved      PT LONG TERM GOAL #2   Title  Pt will amb. 500' over even/uneven terrain with LRAD at MOD I level to improve functional mobility.    Status  On-going      PT LONG TERM GOAL #4   Title  Patient able to climb stairs with a reciprocal gait pattern and BUE support    Status  On-going            Plan - 08/01/19 1342    Clinical Impression Statement  Pt with a 1 point increase in her BERG balance score. She was able to complete multiple sets of seated leg extensions with lith resistance for th first time. No reports of increase pain, she did have some issues initiating movement with her LLE at times. LE fatigues quick with standing marches.    Comorbidities  COPD, HTN, Osteoporosis, mitral valve replacement 2018, breast CA, arthritis bil knees    Examination-Activity Limitations  Locomotion Level;Transfers;Stairs;Stand    Examination-Participation Restrictions  Community Activity    Stability/Clinical Decision Making  Evolving/Moderate complexity    Rehab Potential  Good    PT Duration  8 weeks    PT Treatment/Interventions  ADLs/Self Care Home Management;Gait training;Stair training;Therapeutic activities;Therapeutic exercise;Balance training;Neuromuscular re-education;Patient/family education;Manual techniques    PT Next Visit Plan  continue to work on strength, function, balance, posture       Patient will benefit from skilled therapeutic intervention in order to improve the following deficits and impairments:  Decreased mobility, Postural dysfunction, Decreased strength, Impaired flexibility, Difficulty walking, Decreased balance  Visit Diagnosis: Unsteadiness on feet  Other  abnormalities of gait and mobility  Muscle weakness (generalized)     Problem List Patient Active Problem List   Diagnosis Date Noted  . Osteoporosis 07/05/2019  . Nonischemic cardiomyopathy ([place]) 07/20/2018  . Chest pain 07/20/2018  . Scoliosis 11/20/2016  . Long term current use of anticoagulant 09/18/2016  . Compression fracture of lumbar spine, non-traumatic, sequela 08/11/2016  . Multiple falls 08/11/2016  . At high risk for injury related to fall 08/11/2016  . Encounter for therapeutic drug monitoring 11/27/2013  . Hypothyroidism   . CHF (congestive heart failure) ([place])   . Paroxysmal atrial fibrillation (HCC)   . Arthritis   . Neurogenic bladder   . Warfarin anticoagulation   . First degree heart block 01/29/2012  . 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 S/P Maze operation for atrial fibrillation 01/26/2012  . Anxiety 01/18/2012  . Multiple sclerosis ([place]) 01/19/2007    Scot Jun, PTA 08/01/2019, 1:44 PM  [address] [place], [place], [address] Phone: [phone]   Fax:  [phone]  Name: [name] [name] MRN: [id number] Date of Birth: Sep 28, 1951

## 2019-08-03 ENCOUNTER — Encounter: Payer: Self-pay | Admitting: Physical Therapy

## 2019-08-03 ENCOUNTER — Ambulatory Visit: Payer: Medicare Other | Admitting: Physical Therapy

## 2019-08-03 ENCOUNTER — Other Ambulatory Visit: Payer: Self-pay

## 2019-08-03 DIAGNOSIS — R2681 Unsteadiness on feet: Secondary | ICD-10-CM

## 2019-08-03 DIAGNOSIS — R2689 Other abnormalities of gait and mobility: Secondary | ICD-10-CM

## 2019-08-03 DIAGNOSIS — M6281 Muscle weakness (generalized): Secondary | ICD-10-CM

## 2019-08-03 NOTE — Therapy (Signed)
[place] [address], [place], [address] Phone: [phone]   Fax:  [phone]  Physical Therapy Treatment  Patient Details  Name: [name] [name] MRN: [id number] Date of Birth: 08-Mar-1951 Referring Provider (PT): Howard Pouch   Encounter Date: 08/03/2019  PT End of Session - 08/03/19 1603    Visit Number  11    Date for PT Re-Evaluation  08/16/19    PT Start Time  1320    PT Stop Time  1600    PT Time Calculation (min)  160 min       Past Medical History:  Diagnosis Date  . Anxiety   . Arthritis    knees  . Breast cancer ([place]) 1999  . CHF (congestive heart failure) ([place])    Related to severe mitral regurgitation, April, 2013  . 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 Assess by pulmonary team in the hospital April, 2013  . Ejection fraction    EF 60%, echo, April, 2013, with severe MR before mitral valve replacement  . Herpes   . Hypothyroidism   . IBS (irritable bowel syndrome)   . Mitral valve regurgitation    Mitral valve replacement April, 2013, Mitral valve prolapse  . Multiple sclerosis ([place])   . Neurogenic bladder   . Osteoporosis   . Ovarian cyst   . Paroxysmal atrial fibrillation (HCC)    Rapid atrial fibrillation in-hospital, Rapid cardioversion,  before mitral valve surgery  . Pulmonary hypertension ([place])    Echo, April, 2013, before mitral valve surgery  . S/P Maze operation for atrial fibrillation 01/26/2012   Complete biatrial lesion set using cryothermy via right mini thoracotomy  . S/P mitral valve replacement 01/26/2012   93mm Sorin [employer] Optiform mechanical prosthesis via right mini thoracotomy  . Warfarin anticoagulation    Mechanical mitral prosthesis, April, 20136    Past Surgical History:  Procedure Laterality Date  . BREAST LUMPECTOMY Right 1999   with sent.node, and axillary dissection (20)  . CHEST TUBE INSERTION   01/26/2012   Procedure: CHEST TUBE INSERTION;  Surgeon: Rexene Alberts, MD;  Location: [place];  Service: Open Heart Surgery;  Laterality: Left;  . COLONOSCOPY  2010   "normal"  . CYSTOSCOPY  1992  . LAPAROSCOPIC OVARIAN CYSTECTOMY  1978   urethral stricture repair  . LEFT AND RIGHT HEART CATHETERIZATION WITH CORONARY ANGIOGRAM N/A 01/20/2012   Procedure: LEFT AND RIGHT HEART CATHETERIZATION WITH CORONARY ANGIOGRAM;  Surgeon: Burnell Blanks, MD;  Location: [facility] CATH LAB;  Service: Cardiovascular;  Laterality: N/A;  . LYMPHADENECTOMY    . MAZE  01/26/2012   Procedure: MAZE;  Surgeon: Rexene Alberts, MD;  Location: [place];  Service: Open Heart Surgery;  Laterality: N/A;  . MITRAL VALVE REPLACEMENT  01/26/2012   Procedure: MINIMALLY INVASIVE MITRAL VALVE (MV) REPLACEMENT;  Surgeon: Rexene Alberts, MD;  Location: [place];  Service: Open Heart Surgery;  Laterality: Right;  . TEE WITHOUT CARDIOVERSION  01/19/2012   Procedure: TRANSESOPHAGEAL ECHOCARDIOGRAM (TEE);  Surgeon: Peter M Martinique, MD;  Location: [facility] ENDOSCOPY;  Service: Cardiovascular;  Laterality: N/A;  . TONSILLECTOMY  1970  . [place]  . WRIST SURGERY Right 2012    There were no vitals filed for this visit.  Subjective Assessment - 08/03/19 1524    Subjective  "It is going pretty good, My knee are starting to make noise because it is going  to rain tomorrow "    Currently in Pain?  Yes    Pain Score  1     Pain Location  Knee    Pain Orientation  Left;Right                       [facility] Adult PT Treatment/Exercise - 08/03/19 0001      Ambulation/Gait   Ambulation/Gait  Yes    Ambulation/Gait Assistance  6: Modified independent (Device/Increase time)    Ambulation Distance (Feet)  200 Feet    Assistive device  4-wheeled walker    Gait Pattern  Decreased stride length;Decreased step length - left;Decreased hip/knee flexion - right;Decreased hip/knee flexion - left;Decreased dorsiflexion -  right;Decreased dorsiflexion - left;Right flexed knee in stance;Left flexed knee in stance    Ambulation Surface  Level;Unlevel;Indoor;Paved;Outdoor    Gait Comments  from clinic to car: cues to increase step and stride length and stand upright in walker      Lumbar Exercises: Aerobic   Nustep  lvl 4 x 6 min      Lumbar Exercises: Standing   Other Standing Lumbar Exercises  use of the pull up bar to stretch her out and try to gett better posture      Knee/Hip Exercises: Standing   Other Standing Knee Exercises  Standing march 2lb cuvff rolator 2x10      Knee/Hip Exercises: Seated   Sit to Sand  2 sets;5 reps;without UE support   from elevated UBE seat               PT Short Term Goals - 07/12/19 1607      PT SHORT TERM GOAL #1   Title  Independent with intial  HEP for strengthening and flexibility to increase function.    Status  On-going        PT Long Term Goals - 07/24/19 1530      PT LONG TERM GOAL #1   Title  Independent with HEP for strengthening and flexibility to increase function.    Status  Achieved      PT LONG TERM GOAL #2   Title  Pt will amb. 500' over even/uneven terrain with LRAD at MOD I level to improve functional mobility.    Status  On-going      PT LONG TERM GOAL #4   Title  Patient able to climb stairs with a reciprocal gait pattern and BUE support    Status  On-going            Plan - 08/03/19 1604    Clinical Impression Statement  Positive response from stretching hanging  from pull up bar. No issues reported with recumbent bike. Cues to go through the full ROM with LLE doing HS curls. Fatigue reported with sit to stands. Cues to increase stp length with gait to car.    Comorbidities  COPD, HTN, Osteoporosis, mitral valve replacement 2018, breast CA, arthritis bil knees    Stability/Clinical Decision Making  Evolving/Moderate complexity    Rehab Potential  Good    PT Frequency  2x / week    PT Duration  8 weeks    PT  Treatment/Interventions  ADLs/Self Care Home Management;Gait training;Stair training;Therapeutic activities;Therapeutic exercise;Balance training;Neuromuscular re-education;Patient/family education;Manual techniques    PT Next Visit Plan  continue to work on strength, function, balance, posture       Patient will benefit from skilled therapeutic intervention in order to improve the following deficits and impairments:  Decreased mobility, Postural dysfunction, Decreased strength, Impaired flexibility, Difficulty walking, Decreased balance  Visit Diagnosis: Other abnormalities of gait and mobility  Muscle weakness (generalized)  Unsteadiness on feet     Problem List Patient Active Problem List   Diagnosis Date Noted  . Osteoporosis 07/05/2019  . Nonischemic cardiomyopathy ([place]) 07/20/2018  . Chest pain 07/20/2018  . Scoliosis 11/20/2016  . Long term current use of anticoagulant 09/18/2016  . Compression fracture of lumbar spine, non-traumatic, sequela 08/11/2016  . Multiple falls 08/11/2016  . At high risk for injury related to fall 08/11/2016  . Encounter for therapeutic drug monitoring 11/27/2013  . Hypothyroidism   . CHF (congestive heart failure) ([place])   . Paroxysmal atrial fibrillation (HCC)   . Arthritis   . Neurogenic bladder   . Warfarin anticoagulation   . First [name] heart block 01/29/2012  . 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 S/P Maze operation for atrial fibrillation 01/26/2012  . Anxiety 01/18/2012  . Multiple sclerosis ([place]) 01/19/2007    Scot Jun, PTA 08/03/2019, 4:10 PM  [address] [place], [place], [address] Phone: [phone]   Fax:  [phone]  Name: [name] [name] MRN: [id number] Date of Birth: May 29, 1951

## 2019-08-08 ENCOUNTER — Other Ambulatory Visit: Payer: Self-pay

## 2019-08-08 ENCOUNTER — Ambulatory Visit (INDEPENDENT_AMBULATORY_CARE_PROVIDER_SITE_OTHER): Payer: Medicare Other | Admitting: *Deleted

## 2019-08-08 DIAGNOSIS — Z8679 Personal history of other diseases of the circulatory system: Secondary | ICD-10-CM

## 2019-08-08 DIAGNOSIS — Z5181 Encounter for therapeutic drug level monitoring: Secondary | ICD-10-CM | POA: Diagnosis not present

## 2019-08-08 DIAGNOSIS — Z952 Presence of prosthetic heart valve: Secondary | ICD-10-CM

## 2019-08-08 DIAGNOSIS — Z9889 Other specified postprocedural states: Secondary | ICD-10-CM | POA: Diagnosis not present

## 2019-08-08 DIAGNOSIS — I509 Heart failure, unspecified: Secondary | ICD-10-CM

## 2019-08-08 LAB — POCT INR: INR: 2 (ref 2.0–3.0)

## 2019-08-08 NOTE — Patient Instructions (Signed)
Description   Today take 1.5 tablets then start taking 1 tablet everyday. Recheck in 3 weeks. Call with any new medications or procedures [phone]

## 2019-08-09 ENCOUNTER — Encounter: Payer: Self-pay | Admitting: Family Medicine

## 2019-08-10 ENCOUNTER — Encounter: Payer: Self-pay | Admitting: Physical Therapy

## 2019-08-10 ENCOUNTER — Ambulatory Visit: Payer: Medicare Other | Admitting: Physical Therapy

## 2019-08-10 ENCOUNTER — Other Ambulatory Visit: Payer: Self-pay

## 2019-08-10 DIAGNOSIS — R2689 Other abnormalities of gait and mobility: Secondary | ICD-10-CM

## 2019-08-10 DIAGNOSIS — M6281 Muscle weakness (generalized): Secondary | ICD-10-CM | POA: Diagnosis not present

## 2019-08-10 DIAGNOSIS — R2681 Unsteadiness on feet: Secondary | ICD-10-CM | POA: Diagnosis not present

## 2019-08-10 NOTE — Therapy (Signed)
[place], [place], [address] Phone: [phone]   Fax:  [phone]  Physical Therapy Treatment  Patient Details  Name: [name] [name] MRN: [id number] Date of Birth: Sep 05, 1951 Referring Provider (PT): Howard Pouch   Encounter Date: 08/10/2019  PT End of Session - 08/10/19 1425    Visit Number  12    Number of Visits  16    Date for PT Re-Evaluation  08/16/19    PT Start Time  [id number]    PT Stop Time  1429    PT Time Calculation (min)  44 min    Activity Tolerance  Patient tolerated treatment well    Behavior During Therapy  [facility] for tasks assessed/performed       Past Medical History:  Diagnosis Date  . Anxiety   . Arthritis    knees  . Breast cancer ([place]) 1999  . CHF (congestive heart failure) ([place])    Related to severe mitral regurgitation, April, 2013  . 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 Assess by pulmonary team in the hospital April, 2013  . Ejection fraction    EF 60%, echo, April, 2013, with severe MR before mitral valve replacement  . Herpes   . Hypothyroidism   . IBS (irritable bowel syndrome)   . Mitral valve regurgitation    Mitral valve replacement April, 2013, Mitral valve prolapse  . Multiple sclerosis ([place])   . Neurogenic bladder   . Osteoporosis   . Ovarian cyst   . Paroxysmal atrial fibrillation (HCC)    Rapid atrial fibrillation in-hospital, Rapid cardioversion,  before mitral valve surgery  . Pulmonary hypertension ([place])    Echo, April, 2013, before mitral valve surgery  . S/P Maze operation for atrial fibrillation 01/26/2012   Complete biatrial lesion set using cryothermy via right mini thoracotomy  . S/P mitral valve replacement 01/26/2012   31mm Sorin [employer] Optiform mechanical prosthesis via right mini thoracotomy  . Warfarin anticoagulation    Mechanical mitral prosthesis, April, 20136    Past Surgical History:   Procedure Laterality Date  . BREAST LUMPECTOMY Right 1999   with sent.node, and axillary dissection (20)  . CHEST TUBE INSERTION  01/26/2012   Procedure: CHEST TUBE INSERTION;  Surgeon: Rexene Alberts, MD;  Location: [place];  Service: Open Heart Surgery;  Laterality: Left;  . COLONOSCOPY  2010   "normal"  . CYSTOSCOPY  1992  . LAPAROSCOPIC OVARIAN CYSTECTOMY  1978   urethral stricture repair  . LEFT AND RIGHT HEART CATHETERIZATION WITH CORONARY ANGIOGRAM N/A 01/20/2012   Procedure: LEFT AND RIGHT HEART CATHETERIZATION WITH CORONARY ANGIOGRAM;  Surgeon: Burnell Blanks, MD;  Location: [facility] C[facility] LAB;  Service: Cardiovascular;  Laterality: N/A;  . LYMPHADENECTOMY    . MAZE  01/26/2012   Procedure: MAZE;  Surgeon: Rexene Alberts, MD;  Location: [place];  Service: Open Heart Surgery;  Laterality: N/A;  . MITRAL VALVE REPLACEMENT  01/26/2012   Procedure: MINIMALLY INVASIVE MITRAL VALVE (MV) REPLACEMENT;  Surgeon: Rexene Alberts, MD;  Location: [place];  Service: Open Heart Surgery;  Laterality: Right;  . TEE WITHOUT CARDIOVERSION  01/19/2012   Procedure: TRANSESOPHAGEAL ECHOCARDIOGRAM (TEE);  Surgeon: Peter M Martinique, MD;  Location: [facility] ENDOSCOPY;  Service: Cardiovascular;  Laterality: N/A;  . TONSILLECTOMY  1970  . [place]  . WRIST SURGERY Right 2012    There were no vitals filed for  this visit.  Subjective Assessment - 08/10/19 1351    Subjective  "Doing pretty good"    Currently in Pain?  Yes    Pain Score  2     Pain Location  Knee    Pain Orientation  Left                       [facility] Adult PT Treatment/Exercise - 08/10/19 0001      Ambulation/Gait   Ambulation/Gait  Yes    Ambulation/Gait Assistance  5: Supervision    Ambulation Distance (Feet)  120 Feet    Assistive device  Straight cane    Gait Pattern  Decreased stride length;Decreased step length - left;Decreased hip/knee flexion - right;Decreased hip/knee flexion - left;Decreased  dorsiflexion - right;Decreased dorsiflexion - left;Right flexed knee in stance;Left flexed knee in stance    Ambulation Surface  Level;Indoor    Gait Comments  2 trials aroind inside of clinic       Lumbar Exercises: Aerobic   Nustep  lvl 4 x 6 min      Lumbar Exercises: Standing   Row  Strengthening;Theraband;Limitations;20 reps    Theraband Level (Row)  Level 3 (Green)    Shoulder Extension  Theraband;Strengthening;20 reps    Theraband Level (Shoulder Extension)  Level 3 (Green)      Knee/Hip Exercises: Machines for Strengthening   Cybex Knee Flexion  20lb 3x10      Knee/Hip Exercises: Standing   Other Standing Knee Exercises  Standing hip abd/ext x 10 each     Other Standing Knee Exercises  Standing march 2lb cuff rollator 3x5      Knee/Hip Exercises: Seated   Long Arc Quad  Both;Strengthening;2 sets;15 reps    Long Arc Quad Weight  3 lbs.               PT Short Term Goals - 07/12/19 1607      PT SHORT TERM GOAL #1   Title  Independent with intial  HEP for strengthening and flexibility to increase function.    Status  On-going        PT Long Term Goals - 07/24/19 1530      PT LONG TERM GOAL #1   Title  Independent with HEP for strengthening and flexibility to increase function.    Status  Achieved      PT LONG TERM GOAL #2   Title  Pt will amb. 500' over even/uneven terrain with LRAD at MOD I level to improve functional mobility.    Status  On-going      PT LONG TERM GOAL #4   Title  Patient able to climb stairs with a reciprocal gait pattern and BUE support    Status  On-going            Plan - 08/10/19 1426    Clinical Impression Statement  Pt was able to complete all of today's interventions. Some trunk stiffness noted with hip extensions and abductions. Postural cues needed with standing rows and extensions. Cues for quad contractions with LAQ.    Comorbidities  COPD, HTN, Osteoporosis, mitral valve replacement 2018, breast CA, arthritis bil  knees    Stability/Clinical Decision Making  Evolving/Moderate complexity    Rehab Potential  Good    PT Frequency  2x / week    PT Duration  8 weeks    PT Treatment/Interventions  ADLs/Self Care Home Management;Gait training;Stair training;Therapeutic activities;Therapeutic exercise;Balance training;Neuromuscular re-education;Patient/family education;Manual techniques  PT Next Visit Plan  continue to work on strength, function, balance, posture       Patient will benefit from skilled therapeutic intervention in order to improve the following deficits and impairments:  Decreased mobility, Postural dysfunction, Decreased strength, Impaired flexibility, Difficulty walking, Decreased balance  Visit Diagnosis: Muscle weakness (generalized)  Other abnormalities of gait and mobility  Unsteadiness on feet     Problem List Patient Active Problem List   Diagnosis Date Noted  . Osteoporosis 07/05/2019  . Nonischemic cardiomyopathy ([place]) 07/20/2018  . Chest pain 07/20/2018  . Scoliosis 11/20/2016  . Long term current use of anticoagulant 09/18/2016  . Compression fracture of lumbar spine, non-traumatic, sequela 08/11/2016  . Multiple falls 08/11/2016  . At high risk for injury related to fall 08/11/2016  . Encounter for therapeutic drug monitoring 11/27/2013  . Hypothyroidism   . CHF (congestive heart failure) ([place])   . Paroxysmal atrial fibrillation (HCC)   . Arthritis   . Neurogenic bladder   . Warfarin anticoagulation   . First degree heart block 01/29/2012  . 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 S/P Maze operation for atrial fibrillation 01/26/2012  . Anxiety 01/18/2012  . Multiple sclerosis ([place]) 01/19/2007    Scot Jun, PTA 08/10/2019, 2:31 PM  [address] [place], [place], [address] Phone: [phone]   Fax:  [phone]  Name: [name] [name] MRN: [id number] Date of Birth:  November 27, 1950

## 2019-08-17 ENCOUNTER — Ambulatory Visit: Payer: Medicare Other

## 2019-08-22 ENCOUNTER — Other Ambulatory Visit: Payer: Self-pay

## 2019-08-22 ENCOUNTER — Encounter: Payer: Self-pay | Admitting: Physical Therapy

## 2019-08-22 ENCOUNTER — Ambulatory Visit: Payer: Medicare Other | Attending: Family Medicine | Admitting: Physical Therapy

## 2019-08-22 DIAGNOSIS — M6281 Muscle weakness (generalized): Secondary | ICD-10-CM | POA: Diagnosis not present

## 2019-08-22 DIAGNOSIS — R2689 Other abnormalities of gait and mobility: Secondary | ICD-10-CM | POA: Diagnosis not present

## 2019-08-22 DIAGNOSIS — R2681 Unsteadiness on feet: Secondary | ICD-10-CM

## 2019-08-22 NOTE — Therapy (Signed)
[place], [place], [address] Phone: [phone]   Fax:  [phone]  Physical Therapy Treatment  Patient Details  Name: [name] [name] MRN: [id number] Date of Birth: [birth year] Referring Provider (PT): Howard Pouch   Encounter Date: 08/22/2019  PT End of Session - 08/22/19 1610    Visit Number  13    Date for PT Re-Evaluation  08/22/19    PT Start Time  1438    PT Stop Time  1526    PT Time Calculation (min)  48 min    Activity Tolerance  Patient tolerated treatment well    Behavior During Therapy  [facility] for tasks assessed/performed       Past Medical History:  Diagnosis Date  . Anxiety   . Arthritis    knees  . Breast cancer ([place]) 1999  . CHF (congestive heart failure) ([place])    Related to severe mitral regurgitation, April, 2013  . 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 Assess by pulmonary team in the hospital April, 2013  . Ejection fraction    EF 60%, echo, April, 2013, with severe MR before mitral valve replacement  . Herpes   . Hypothyroidism   . IBS (irritable bowel syndrome)   . Mitral valve regurgitation    Mitral valve replacement April, 2013, Mitral valve prolapse  . Multiple sclerosis ([place])   . Neurogenic bladder   . Osteoporosis   . Ovarian cyst   . Paroxysmal atrial fibrillation (HCC)    Rapid atrial fibrillation in-hospital, Rapid cardioversion,  before mitral valve surgery  . Pulmonary hypertension ([place])    Echo, April, 2013, before mitral valve surgery  . S/P Maze operation for atrial fibrillation 01/26/2012   Complete biatrial lesion set using cryothermy via right mini thoracotomy  . S/P mitral valve replacement 01/26/2012   65mm Sorin [employer] Optiform mechanical prosthesis via right mini thoracotomy  . Warfarin anticoagulation    Mechanical mitral prosthesis, April, 20136    Past Surgical History:  Procedure Laterality  Date  . BREAST LUMPECTOMY Right 1999   with sent.node, and axillary dissection (20)  . CHEST TUBE INSERTION  01/26/2012   Procedure: CHEST TUBE INSERTION;  Surgeon: Rexene Alberts, MD;  Location: [place];  Service: Open Heart Surgery;  Laterality: Left;  . COLONOSCOPY  2010   "normal"  . CYSTOSCOPY  1992  . LAPAROSCOPIC OVARIAN CYSTECTOMY  1978   urethral stricture repair  . LEFT AND RIGHT HEART CATHETERIZATION WITH CORONARY ANGIOGRAM N/A 01/20/2012   Procedure: LEFT AND RIGHT HEART CATHETERIZATION WITH CORONARY ANGIOGRAM;  Surgeon: Burnell Blanks, MD;  Location: [facility] CATH LAB;  Service: Cardiovascular;  Laterality: N/A;  . LYMPHADENECTOMY    . MAZE  01/26/2012   Procedure: MAZE;  Surgeon: Rexene Alberts, MD;  Location: [place];  Service: Open Heart Surgery;  Laterality: N/A;  . MITRAL VALVE REPLACEMENT  01/26/2012   Procedure: MINIMALLY INVASIVE MITRAL VALVE (MV) REPLACEMENT;  Surgeon: Rexene Alberts, MD;  Location: [place];  Service: Open Heart Surgery;  Laterality: Right;  . TEE WITHOUT CARDIOVERSION  01/19/2012   Procedure: TRANSESOPHAGEAL ECHOCARDIOGRAM (TEE);  Surgeon: Peter M Martinique, MD;  [facility];  Service: Cardiovascular;  Laterality: N/A;  . TONSILLECTOMY  1970  . [place]  . WRIST SURGERY Right 2012    There were no vitals filed for this visit.  Subjective Assessment - 08/22/19 1445  Subjective  reports a little sore as she is having trouble with posture, brought in [facility]    Currently in Pain?  No/denies         [facility] PT Assessment - 08/22/19 0001      Assessment   Medical Diagnosis  gait instability    Referring Provider (PT)  Howard Pouch                   [facility] Adult PT Treatment/Exercise - 08/22/19 0001      Ambulation/Gait   Gait Comments  worked with patient in the hall, did HHA and some gentle pulling with constant VC's for long big steps, as well as some cues for posture.  We walked in the hall 75' x 4, the used the  [facility] again same cues for psoture and steps 2x75, some with her rolling walker, again cues and I did a lot of education about the transfers and the need to get turned around before sitting, and then again taking big steps all the time      High Level Balance   High Level Balance Activities  Side stepping    High Level Balance Comments  in hall in corner for safety ball toss and ball kicks      Lumbar Exercises: Standing   Other Standing Lumbar Exercises  use of the pull up bar to stretch her out and try to gett better posture    Other Standing Lumbar Exercises  also with her sitting some passive thoracic extension to open up chest and help with posture               PT Short Term Goals - 07/12/19 1607      PT SHORT TERM GOAL #1   Title  Independent with intial  HEP for strengthening and flexibility to increase function.    Status  On-going        PT Long Term Goals - 08/22/19 1613      PT LONG TERM GOAL #1   Title  Independent with HEP for strengthening and flexibility to increase function.    Status  Achieved      PT LONG TERM GOAL #2   Title  Pt will amb. 500' over even/uneven terrain with LRAD at MOD I level to improve functional mobility.    Status  On-going      PT LONG TERM GOAL #3   Title  Pt will amb. 150' with SPC at MOD I level (indoors) in order to safely amb. at home.     Status  On-going      PT LONG TERM GOAL #4   Title  Patient able to climb stairs with a reciprocal gait pattern and BUE support    Status  On-going            Plan - 08/22/19 1611    Clinical Impression Statement  constant verbal cues for posture and for step length, she tends to shuffle and be stooped she can correct with cues, after the stretching for posture she did c/o some hip flexor pain, I feel that she did very well with the HHA and the [facility] gait with the cues, she will revert back to the shuffles but again will correct with a few cues    PT Next Visit Plan  work on gait and  posture and safety    Consulted and Agree with Plan of Care  Patient       Patient will benefit  from skilled therapeutic intervention in order to improve the following deficits and impairments:  Decreased mobility, Postural dysfunction, Decreased strength, Impaired flexibility, Difficulty walking, Decreased balance  Visit Diagnosis: Muscle weakness (generalized) - Plan: PT plan of care cert/re-cert  Other abnormalities of gait and mobility - Plan: PT plan of care cert/re-cert  Unsteadiness on feet - Plan: PT plan of care cert/re-cert     Problem List Patient Active Problem List   Diagnosis Date Noted  . Osteoporosis 07/05/2019  . Nonischemic cardiomyopathy ([place]) 07/20/2018  . Chest pain 07/20/2018  . Scoliosis 11/20/2016  . Long term current use of anticoagulant 09/18/2016  . Compression fracture of lumbar spine, non-traumatic, sequela 08/11/2016  . Multiple falls 08/11/2016  . At high risk for injury related to fall 08/11/2016  . Encounter for therapeutic drug monitoring 11/27/2013  . Hypothyroidism   . CHF (congestive heart failure) ([place])   . Paroxysmal atrial fibrillation (HCC)   . Arthritis   . Neurogenic bladder   . Warfarin anticoagulation   . First degree heart block 01/29/2012  . 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 S/P Maze operation for atrial fibrillation 01/26/2012  . Anxiety 01/18/2012  . Multiple sclerosis ([place]) 01/19/2007    Sumner Boast., PT 08/22/2019, 4:14 PM  [address] [place] [address] [place], [place], [address] Phone: [phone]   Fax:  [phone]  Name: RUDIE JAVIER MRN: [id number] Date of Birth: 12/06/1950

## 2019-08-23 ENCOUNTER — Ambulatory Visit: Payer: Medicare Other | Admitting: Physical Therapy

## 2019-08-23 DIAGNOSIS — R2689 Other abnormalities of gait and mobility: Secondary | ICD-10-CM | POA: Diagnosis not present

## 2019-08-23 DIAGNOSIS — M6281 Muscle weakness (generalized): Secondary | ICD-10-CM | POA: Diagnosis not present

## 2019-08-23 DIAGNOSIS — R2681 Unsteadiness on feet: Secondary | ICD-10-CM | POA: Diagnosis not present

## 2019-08-23 NOTE — Therapy (Signed)
[place], [place], [address] Phone: [phone]   Fax:  [phone]  Physical Therapy Treatment  Patient Details  Name: [name] [name] MRN: [id number] Date of Birth: July 12, 1951 Referring Provider (PT): Howard Pouch   Encounter Date: 08/23/2019  PT End of Session - 08/23/19 1543    Visit Number  14    PT Start Time  9357    PT Stop Time  1525    PT Time Calculation (min)  50 min       Past Medical History:  Diagnosis Date  . Anxiety   . Arthritis    knees  . Breast cancer ([place]) 1999  . CHF (congestive heart failure) ([place])    Related to severe mitral regurgitation, April, 2013  . 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 Assess by pulmonary team in the hospital April, 2013  . Ejection fraction    EF 60%, echo, April, 2013, with severe MR before mitral valve replacement  . Herpes   . Hypothyroidism   . IBS (irritable bowel syndrome)   . Mitral valve regurgitation    Mitral valve replacement April, 2013, Mitral valve prolapse  . Multiple sclerosis ([place])   . Neurogenic bladder   . Osteoporosis   . Ovarian cyst   . Paroxysmal atrial fibrillation (HCC)    Rapid atrial fibrillation in-hospital, Rapid cardioversion,  before mitral valve surgery  . Pulmonary hypertension ([place])    Echo, April, 2013, before mitral valve surgery  . S/P Maze operation for atrial fibrillation 01/26/2012   Complete biatrial lesion set using cryothermy via right mini thoracotomy  . S/P mitral valve replacement 01/26/2012   16m Sorin [employer] Optiform mechanical prosthesis via right mini thoracotomy  . Warfarin anticoagulation    Mechanical mitral prosthesis, April, 20136    Past Surgical History:  Procedure Laterality Date  . BREAST LUMPECTOMY Right 1999   with sent.node, and axillary dissection (20)  . CHEST TUBE INSERTION  01/26/2012   Procedure: CHEST TUBE INSERTION;   Surgeon: CRexene Alberts MD;  Location: [place]  Service: Open Heart Surgery;  Laterality: Left;  . COLONOSCOPY  2010   "normal"  . CYSTOSCOPY  1992  . LAPAROSCOPIC OVARIAN CYSTECTOMY  1978   urethral stricture repair  . LEFT AND RIGHT HEART CATHETERIZATION WITH CORONARY ANGIOGRAM N/A 01/20/2012   Procedure: LEFT AND RIGHT HEART CATHETERIZATION WITH CORONARY ANGIOGRAM;  Surgeon: CBurnell Blanks MD;  [facility];  Service: Cardiovascular;  Laterality: N/A;  . LYMPHADENECTOMY    . MAZE  01/26/2012   Procedure: MAZE;  Surgeon: CRexene Alberts MD;  [place]  Service: Open Heart Surgery;  Laterality: N/A;  . MITRAL VALVE REPLACEMENT  01/26/2012   Procedure: MINIMALLY INVASIVE MITRAL VALVE (MV) REPLACEMENT;  Surgeon: CRexene Alberts MD;  [place]  Service: Open Heart Surgery;  Laterality: Right;  . TEE WITHOUT CARDIOVERSION  01/19/2012   Procedure: TRANSESOPHAGEAL ECHOCARDIOGRAM (TEE);  Surgeon: Peter M JMartinique MD;  [facility];  Service: Cardiovascular;  Laterality: N/A;  . TONS[place]OMY  1970  . [place] . WRIST SURGERY Right 2012    There were no vitals filed for this visit.  Subjective Assessment - 08/23/19 1440    Subjective  "a little pain in the back. leftt foot is sticking and balance feels off"    Currently in Pain?  Yes  Pain Score  2     Pain Location  Back                       [facility] Adult PT Treatment/Exercise - 08/23/19 0001      Ambulation/Gait   Ambulation/Gait  Yes    Ambulation/Gait Assistance  5: Supervision    Assistive device  Straight cane    Gait Pattern  Decreased stride length;Decreased step length - left;Right flexed knee in stance;Left flexed knee in stance    Gait Comments  worked with patient in the hall, did HHA w/ constant VC's for long big steps, as well as some cues for posture.       High Level Balance   High Level Balance Comments  in hall in corner for ball kicks       Lumbar Exercises: Aerobic   Nustep  lvl 4 x 7 min      Lumbar Exercises: Standing   Row  Strengthening;Theraband;Limitations;20 reps    Theraband Level (Row)  Level 3 (Green)    Shoulder Extension  Theraband;Strengthening;20 reps    Theraband Level (Shoulder Extension)  Level 3 (Green)               PT Short Term Goals - 08/23/19 1541      PT SHORT TERM GOAL #2   Title  Patient to decrease 5x sit to stand time to 15 seconds or less to decrease fall risk.    Status  On-going        PT Long Term Goals - 08/23/19 1542      PT LONG TERM GOAL #2   Title  Pt will amb. 500' over even/uneven terrain with LRAD at MOD I level to improve functional mobility.    Status  On-going      PT LONG TERM GOAL #3   Title  Pt will amb. 150' with SPC at MOD I level (indoors) in order to safely amb. at home.     Status  Partially Met            Plan - 08/23/19 1535    Clinical Impression Statement  pt walked in the hallway with the [facility].pt able to walk w/ HHA and then progressed to no HHA. pt needs constant cues for posture, step length, and picking up feet. pt able to maintain balance with ball kicks in the corner.    Personal Factors and Comorbidities  Age;Fitness;Comorbidity 3+    Comorbidities  COPD, HTN, Osteoporosis, mitral valve replacement 2018, breast CA, arthritis bil knees    Examination-Activity Limitations  Locomotion Level;Transfers;Stairs;Stand    Examination-Participation Restrictions  Community Activity    Stability/Clinical Decision Making  Evolving/Moderate complexity    Rehab Potential  Good    PT Frequency  2x / week    PT Duration  8 weeks    PT Treatment/Interventions  ADLs/Self Care Home Management;Gait training;Stair training;Therapeutic activities;Therapeutic exercise;Balance training;Neuromuscular re-education;Patient/family education;Manual techniques       Patient will benefit from skilled therapeutic intervention in order to improve the following  deficits and impairments:  Decreased mobility, Postural dysfunction, Decreased strength, Impaired flexibility, Difficulty walking, Decreased balance  Visit Diagnosis: Other abnormalities of gait and mobility     Problem List Patient Active Problem List   Diagnosis Date Noted  . Osteoporosis 07/05/2019  . Nonischemic cardiomyopathy ([place]) 07/20/2018  . Chest pain 07/20/2018  . Scoliosis 11/20/2016  . Long term current use of anticoagulant 09/18/2016  . Compression fracture  of lumbar spine, non-traumatic, sequela 08/11/2016  . Multiple falls 08/11/2016  . At high risk for injury related to fall 08/11/2016  . Encounter for therapeutic drug monitoring 11/27/2013  . Hypothyroidism   . CHF (congestive heart failure) ([place])   . Paroxysmal atrial fibrillation (HCC)   . Arthritis   . Neurogenic bladder   . Warfarin anticoagulation   . First degree heart block 01/29/2012  . 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 S/P Maze operation for atrial fibrillation 01/26/2012  . Anxiety 01/18/2012  . Multiple sclerosis ([place]) 01/19/2007    Barrett Henle, [place] 08/23/2019, [place], [place], [address] Phone: [phone]   Fax:  [phone]  Name: [name] [name] MRN: [id number] Date of Birth: 1950-10-26

## 2019-08-30 ENCOUNTER — Ambulatory Visit: Payer: Medicare Other | Admitting: Physical Therapy

## 2019-08-31 ENCOUNTER — Encounter: Payer: Medicare Other | Admitting: Physical Therapy

## 2019-09-04 ENCOUNTER — Other Ambulatory Visit: Payer: Self-pay

## 2019-09-04 ENCOUNTER — Ambulatory Visit: Payer: Medicare Other | Admitting: Physical Therapy

## 2019-09-04 DIAGNOSIS — R2681 Unsteadiness on feet: Secondary | ICD-10-CM | POA: Diagnosis not present

## 2019-09-04 DIAGNOSIS — M6281 Muscle weakness (generalized): Secondary | ICD-10-CM

## 2019-09-04 DIAGNOSIS — R2689 Other abnormalities of gait and mobility: Secondary | ICD-10-CM | POA: Diagnosis not present

## 2019-09-04 NOTE — Therapy (Signed)
[place], [place], [address] Phone: [phone]   Fax:  [phone]  Physical Therapy Treatment  Patient Details  Name: [name] [name] MRN: [id number] Date of Birth: 15-Oct-1951 Referring Provider (PT): Howard Pouch   Encounter Date: 09/04/2019  PT End of Session - 09/04/19 1607    Visit Number  15    PT Start Time  0174    PT Stop Time  1605    PT Time Calculation (min)  48 min       Past Medical History:  Diagnosis Date  . Anxiety   . Arthritis    knees  . Breast cancer ([place]) 1999  . CHF (congestive heart failure) ([place])    Related to severe mitral regurgitation, April, 2013  . 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 Assess by pulmonary team in the hospital April, 2013  . Ejection fraction    EF 60%, echo, April, 2013, with severe MR before mitral valve replacement  . Herpes   . Hypothyroidism   . IBS (irritable bowel syndrome)   . Mitral valve regurgitation    Mitral valve replacement April, 2013, Mitral valve prolapse  . Multiple sclerosis ([place])   . Neurogenic bladder   . Osteoporosis   . Ovarian cyst   . Paroxysmal atrial fibrillation (HCC)    Rapid atrial fibrillation in-hospital, Rapid cardioversion,  before mitral valve surgery  . Pulmonary hypertension ([place])    Echo, April, 2013, before mitral valve surgery  . S/P Maze operation for atrial fibrillation 01/26/2012   Complete biatrial lesion set using cryothermy via right mini thoracotomy  . S/P mitral valve replacement 01/26/2012   63m Sorin [employer] Optiform mechanical prosthesis via right mini thoracotomy  . Warfarin anticoagulation    Mechanical mitral prosthesis, April, 20136    Past Surgical History:  Procedure Laterality Date  . BREAST LUMPECTOMY Right 1999   with sent.node, and axillary dissection (20)  . CHEST TUBE INSERTION  01/26/2012   Procedure: CHEST TUBE INSERTION;   Surgeon: CRexene Alberts MD;  [place]  Service: Open Heart Surgery;  Laterality: Left;  . COLONOSCOPY  2010   "normal"  . CYSTOSCOPY  1992  . LAPAROSCOPIC OVARIAN CYSTECTOMY  1978   urethral stricture repair  . LEFT AND RIGHT HEART CATHETERIZATION WITH CORONARY ANGIOGRAM N/A 01/20/2012   Procedure: LEFT AND RIGHT HEART CATHETERIZATION WITH CORONARY ANGIOGRAM;  Surgeon: CBurnell Blanks MD;  [facility];  Service: Cardiovascular;  Laterality: N/A;  . LYMPHADENECTOMY    . MAZE  01/26/2012   Procedure: MAZE;  Surgeon: CRexene Alberts MD;  [place]  Service: Open Heart Surgery;  Laterality: N/A;  . MITRAL VALVE REPLACEMENT  01/26/2012   Procedure: MINIMALLY INVASIVE MITRAL VALVE (MV) REPLACEMENT;  Surgeon: CRexene Alberts MD;  [place]: [facility]  Service: Open Heart Surgery;  Laterality: Right;  . TEE WITHOUT CARDIOVERSION  01/19/2012   Procedure: TRANSESOPHAGEAL ECHOCARDIOGRAM (TEE);  Surgeon: Peter M JMartinique MD;  [facility];  Service: Cardiovascular;  Laterality: N/A;  . TONSILLECTOMY  1970  . [place] . WRIST SURGERY Right 2012    There were no vitals filed for this visit.  Subjective Assessment - 09/04/19 1527    Subjective  pt states that loss her balance on thursday and fell onto her coffee table and bruised her tailbone.    Currently in Pain?  No/denies                       [facility] Adult PT Treatment/Exercise - 09/04/19 0001      Ambulation/Gait   Ambulation/Gait  Yes    Ambulation/Gait Assistance  5: Supervision    Assistive device  Straight cane    Gait Pattern  Decreased stride length;Decreased step length - left;Right flexed knee in stance;Left flexed knee in stance      Lumbar Exercises: Machines for Strengthening   Cybex Knee Extension  5# 2 x 10    Cybex Knee Flexion  15# 2 x 10      Lumbar Exercises: Standing   Row  Strengthening;Theraband;Limitations;20 reps    Theraband Level (Row)  Level  2 (Red)    Shoulder Extension  Theraband;Strengthening;20 reps    Theraband Level (Shoulder Extension)  Level 2 (Red)    Other Standing Lumbar Exercises  hanging stretch from pull up bar      Knee/Hip Exercises: Aerobic   Nustep  L4 x 7 min               PT Short Term Goals - 08/23/19 1541      PT SHORT TERM GOAL #2   Title  Patient to decrease 5x sit to stand time to 15 seconds or less to decrease fall risk.    Status  On-going        PT Long Term Goals - 08/23/19 1542      PT LONG TERM GOAL #2   Title  Pt will amb. 500' over even/uneven terrain with LRAD at MOD I level to improve functional mobility.    Status  On-going      PT LONG TERM GOAL #3   Title  Pt will amb. 150' with SPC at MOD I level (indoors) in order to safely amb. at home.     Status  Partially Met            Plan - 09/04/19 1609    Clinical Impression Statement  pt came in on the walker. she is picking her feet , but she still needs cues for bigger steps . pt needs cues with knee flexion to improve form and technique. pt able to walk across gym on the cane with CGA. pt needs cues when sitting down to increase safety.    Personal Factors and Comorbidities  Age;Fitness;Comorbidity 3+    Comorbidities  COPD, HTN, Osteoporosis, mitral valve replacement 2018, breast CA, arthritis bil knees    Examination-Participation Restrictions  Community Activity    Stability/Clinical Decision Making  Evolving/Moderate complexity    Rehab Potential  Good    PT Frequency  2x / week    PT Duration  8 weeks    PT Treatment/Interventions  ADLs/Self Care Home Management;Gait training;Stair training;Therapeutic activities;Therapeutic exercise;Balance training;Neuromuscular re-education;Patient/family education;Manual techniques    PT Next Visit Plan  work on gait and posture and safety       Patient will benefit from skilled therapeutic intervention in order to improve the following deficits and impairments:   Decreased mobility, Postural dysfunction, Decreased strength, Impaired flexibility, Difficulty walking, Decreased balance  Visit Diagnosis: Muscle weakness (generalized)     Problem List Patient Active Problem List   Diagnosis Date Noted  . Osteoporosis 07/05/2019  . Nonischemic cardiomyopathy ([place]) 07/20/2018  . Chest pain 07/20/2018  . Scoliosis 11/20/2016  . Long term current use of anticoagulant 09/18/2016  . Compression fracture of lumbar spine, non-traumatic,  sequela 08/11/2016  . Multiple falls 08/11/2016  . At high risk for injury related to fall 08/11/2016  . Encounter for therapeutic drug monitoring 11/27/2013  . Hypothyroidism   . CHF (congestive heart failure) ([place])   . Paroxysmal atrial fibrillation (HCC)   . Arthritis   . Neurogenic bladder   . Warfarin anticoagulation   . First degree heart block 01/29/2012  . 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 S/P Maze operation for atrial fibrillation 01/26/2012  . Anxiety 01/18/2012  . Multiple sclerosis [facility]) 01/19/2007    Barrett Henle, [place] 09/04/2019, 4:51 PM  [place], [place], [address] Phone: [phone]   Fax:  [phone]  Name: [name] [name] MRN: [id number] Date of Birth: 03-27-1951

## 2019-09-05 ENCOUNTER — Ambulatory Visit (INDEPENDENT_AMBULATORY_CARE_PROVIDER_SITE_OTHER): Payer: Medicare Other | Admitting: *Deleted

## 2019-09-05 DIAGNOSIS — Z8679 Personal history of other diseases of the circulatory system: Secondary | ICD-10-CM | POA: Diagnosis not present

## 2019-09-05 DIAGNOSIS — Z5181 Encounter for therapeutic drug level monitoring: Secondary | ICD-10-CM

## 2019-09-05 DIAGNOSIS — Z9889 Other specified postprocedural states: Secondary | ICD-10-CM | POA: Diagnosis not present

## 2019-09-05 DIAGNOSIS — I509 Heart failure, unspecified: Secondary | ICD-10-CM

## 2019-09-05 DIAGNOSIS — Z952 Presence of prosthetic heart valve: Secondary | ICD-10-CM

## 2019-09-05 LAB — POCT INR: INR: 2.7 (ref 2.0–3.0)

## 2019-09-05 NOTE — Patient Instructions (Signed)
Description   Continue taking 1 tablet everyday. Recheck in 4 weeks. Call with any new medications or procedures [phone]

## 2019-09-07 ENCOUNTER — Encounter: Payer: Self-pay | Admitting: Physical Therapy

## 2019-09-07 ENCOUNTER — Other Ambulatory Visit: Payer: Self-pay

## 2019-09-07 ENCOUNTER — Ambulatory Visit: Payer: Medicare Other | Admitting: Physical Therapy

## 2019-09-07 DIAGNOSIS — R2681 Unsteadiness on feet: Secondary | ICD-10-CM

## 2019-09-07 DIAGNOSIS — M6281 Muscle weakness (generalized): Secondary | ICD-10-CM

## 2019-09-07 DIAGNOSIS — R2689 Other abnormalities of gait and mobility: Secondary | ICD-10-CM | POA: Diagnosis not present

## 2019-09-07 NOTE — Therapy (Signed)
[place] [address] [place], [place], [address] Phone: [phone]   Fax:  [phone]  Physical Therapy Treatment  Patient Details  Name: [name] [name] MRN: [id number] Date of Birth: [birth year] Referring Provider (PT): Howard Pouch   Encounter Date: 09/07/2019  PT End of Session - 09/07/19 1522    Visit Number  16    Number of Visits  21    Date for PT Re-Evaluation  09/21/19    PT Start Time  7673    PT Stop Time  1605    PT Time Calculation (min)  44 min    Activity Tolerance  Patient tolerated treatment well    Behavior During Therapy  [facility] for tasks assessed/performed       Past Medical History:  Diagnosis Date  . Anxiety   . Arthritis    knees  . Breast cancer ([place]) 1999  . CHF (congestive heart failure) ([place])    Related to severe mitral regurgitation, April, 2013  . 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 Assess by pulmonary team in the hospital April, 2013  . Ejection fraction    EF 60%, echo, April, 2013, with severe MR before mitral valve replacement  . Herpes   . Hypothyroidism   . IBS (irritable bowel syndrome)   . Mitral valve regurgitation    Mitral valve replacement April, 2013, Mitral valve prolapse  . Multiple sclerosis ([place])   . Neurogenic bladder   . Osteoporosis   . Ovarian cyst   . Paroxysmal atrial fibrillation (HCC)    Rapid atrial fibrillation in-hospital, Rapid cardioversion,  before mitral valve surgery  . Pulmonary hypertension ([place])    Echo, April, 2013, before mitral valve surgery  . S/P Maze operation for atrial fibrillation 01/26/2012   Complete biatrial lesion set using cryothermy via right mini thoracotomy  . S/P mitral valve replacement 01/26/2012   64m Sorin [employer] Optiform mechanical prosthesis via right mini thoracotomy  . Warfarin anticoagulation    Mechanical mitral prosthesis, April, 20136    Past Surgical History:   Procedure Laterality Date  . BREAST LUMPECTOMY Right 1999   with sent.node, and axillary dissection (20)  . CHEST TUBE INSERTION  01/26/2012   Procedure: CHEST TUBE INSERTION;  Surgeon: CRexene Alberts MD;  [place]  Service: Open Heart Surgery;  Laterality: Left;  . COLONOSCOPY  2010   "normal"  . CYSTOSCOPY  1992  . LAPAROSCOPIC OVARIAN CYSTECTOMY  1978   urethral stricture repair  . LEFT AND RIGHT HEART CATHETERIZATION WITH CORONARY ANGIOGRAM N/A 01/20/2012   Procedure: LEFT AND RIGHT HEART CATHETERIZATION WITH CORONARY ANGIOGRAM;  Surgeon: CBurnell Blanks MD;  [facility];  Service: Cardiovascular;  Laterality: N/A;  . LYMPHADENECTOMY    . MAZE  01/26/2012   Procedure: MAZE;  Surgeon: CRexene Alberts MD;  [place]  Service: Open Heart Surgery;  Laterality: N/A;  . MITRAL VALVE REPLACEMENT  01/26/2012   Procedure: MINIMALLY INVASIVE MITRAL VALVE (MV) REPLACEMENT;  Surgeon: CRexene Alberts MD;  [place] [facility]  Service: Open Heart Surgery;  Laterality: Right;  . TEE WITHOUT CARDIOVERSION  01/19/2012   Procedure: TRANSESOPHAGEAL ECHOCARDIOGRAM (TEE);  Surgeon: Peter M JMartinique MD;  [facility];  Service: Cardiovascular;  Laterality: N/A;  . TONSILLECTOMY  1970  . [place] . WRIST SURGERY Right 2012    There were no vitals filed for  this visit.  Subjective Assessment - 09/07/19 1523    Subjective  Pt reports she is still sore in her tailbone since the beginning of this week.    Currently in Pain?  Yes    Pain Score  4     Pain Location  [place]    Pain Orientation  Lower    Pain Descriptors / Indicators  Aching;Dull;Sore    Pain Type  Chronic pain                       [facility] Adult PT Treatment/Exercise - 09/07/19 0001      Neuro Re-ed    Neuro Re-ed Details   balance work, stepping over half noodle,then whole bolster with bilat UE holds , Wt shifting FWD/BWD in stance with one in front of other - had  difficulty with Lt LE behind - requrired assistance to not fall.       Exercises   Exercises  Other Exercises    Other Exercises   high kneel, reaching overhead and passes side to side with CGA.       Lumbar Exercises: Seated   Sit to Stand  10 reps    Sit to Stand Limitations  stand with UE assist , lower without      Knee/Hip Exercises: Stretches   Hip Flexor Stretch  Both;2 reps;30 seconds   seated with overhead reach and chest lifted   Other Knee/Hip Stretches  full body stretch with arms holding high on bar               PT Short Term Goals - 08/23/19 1541      PT SHORT TERM GOAL #2   Title  Patient to decrease 5x sit to stand time to 15 seconds or less to decrease fall risk.    Status  On-going        PT Long Term Goals - 08/23/19 1542      PT LONG TERM GOAL #2   Title  Pt will amb. 500' over even/uneven terrain with LRAD at MOD I level to improve functional mobility.    Status  On-going      PT LONG TERM GOAL #3   Title  Pt will amb. 150' with SPC at MOD I level (indoors) in order to safely amb. at home.     Status  Partially Met            Plan - 09/07/19 1606    Clinical Impression Statement  [name] [name] presented with some faigue today as she has had a busy week.  She was challenged with work in high kneel on the edge of the bed.  She is significantly tight in bilat hip flexors and was able to stand more upright after stretching these out. She would benefit from back of the body and glut med strengthening to assist with improving balance and stability in upright postures.    Comorbidities  COPD, HTN, Osteoporosis, mitral valve replacement 2018, breast CA, arthritis bil knees    Rehab Potential  Good    PT Frequency  2x / week    PT Duration  8 weeks    PT Treatment/Interventions  ADLs/Self Care Home Management;Gait training;Stair training;Therapeutic activities;Therapeutic exercise;Balance training;Neuromuscular re-education;Patient/family  education;Manual techniques    PT Next Visit Plan   hip flexor stretches, back of the body strengthening and balance    Consulted and Agree with Plan of Care  Patient       Patient  will benefit from skilled therapeutic intervention in order to improve the following deficits and impairments:  Decreased mobility, Postural dysfunction, Decreased strength, Impaired flexibility, Difficulty walking, Decreased balance  Visit Diagnosis: Muscle weakness (generalized)  Other abnormalities of gait and mobility  Unsteadiness on feet     Problem List Patient Active Problem List   Diagnosis Date Noted  . Osteoporosis 07/05/2019  . Nonischemic cardiomyopathy ([place]) 07/20/2018  . Chest pain 07/20/2018  . Scoliosis 11/20/2016  . Long term current use of anticoagulant 09/18/2016  . Compression fracture of lumbar spine, non-traumatic, sequela 08/11/2016  . Multiple falls 08/11/2016  . At high risk for injury related to fall 08/11/2016  . Encounter for therapeutic drug monitoring 11/27/2013  . Hypothyroidism   . CHF (congestive heart failure) ([place])   . Paroxysmal atrial fibrillation (HCC)   . Arthritis   . Neurogenic bladder   . Warfarin anticoagulation   . First degree heart block 01/29/2012  . 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 S/P Maze operation for atrial fibrillation 01/26/2012  . Anxiety 01/18/2012  . Multiple sclerosis ([place]) 01/19/2007    Jeral Pinch PT  09/07/2019, [address] [place], [place], [address] Phone: [phone]   Fax:  [phone]  Name: [name] [name] MRN: [id number] Date of Birth: [birth year]

## 2019-09-20 ENCOUNTER — Encounter: Payer: Self-pay | Admitting: Physical Therapy

## 2019-09-20 ENCOUNTER — Ambulatory Visit: Payer: Medicare Other | Attending: Family Medicine | Admitting: Physical Therapy

## 2019-09-20 ENCOUNTER — Other Ambulatory Visit: Payer: Self-pay

## 2019-09-20 DIAGNOSIS — M6281 Muscle weakness (generalized): Secondary | ICD-10-CM | POA: Diagnosis not present

## 2019-09-20 DIAGNOSIS — R2681 Unsteadiness on feet: Secondary | ICD-10-CM | POA: Diagnosis not present

## 2019-09-20 DIAGNOSIS — R2689 Other abnormalities of gait and mobility: Secondary | ICD-10-CM | POA: Diagnosis not present

## 2019-09-20 NOTE — Therapy (Signed)
[place], [place], [address] Phone: [phone]   Fax:  [phone]  Physical Therapy Treatment  Patient Details  Name: [name] [name] MRN: [id number] Date of Birth: 1951-03-26 Referring Provider (PT): Howard Pouch   Encounter Date: 09/20/2019  PT End of Session - 09/20/19 1645    Visit Number  17    Date for PT Re-Evaluation  10/22/19    PT Start Time  5009    PT Stop Time  1440    PT Time Calculation (min)  43 min    Activity Tolerance  Patient tolerated treatment well    Behavior During Therapy  [facility] for tasks assessed/performed       Past Medical History:  Diagnosis Date  . Anxiety   . Arthritis    knees  . Breast cancer ([place]) 1999  . CHF (congestive heart failure) ([place])    Related to severe mitral regurgitation, April, 2013  . 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 Assess by pulmonary team in the hospital April, 2013  . Ejection fraction    EF 60%, echo, April, 2013, with severe MR before mitral valve replacement  . Herpes   . Hypothyroidism   . IBS (irritable bowel syndrome)   . Mitral valve regurgitation    Mitral valve replacement April, 2013, Mitral valve prolapse  . Multiple sclerosis ([place])   . Neurogenic bladder   . Osteoporosis   . Ovarian cyst   . Paroxysmal atrial fibrillation (HCC)    Rapid atrial fibrillation in-hospital, Rapid cardioversion,  before mitral valve surgery  . Pulmonary hypertension ([place])    Echo, April, 2013, before mitral valve surgery  . S/P Maze operation for atrial fibrillation 01/26/2012   Complete biatrial lesion set using cryothermy via right mini thoracotomy  . S/P mitral valve replacement 01/26/2012   36m Sorin [employer] Optiform mechanical prosthesis via right mini thoracotomy  . Warfarin anticoagulation    Mechanical mitral prosthesis, April, 20136    Past Surgical History:  Procedure Laterality  Date  . BREAST LUMPECTOMY Right 1999   with sent.node, and axillary dissection (20)  . CHEST TUBE INSERTION  01/26/2012   Procedure: CHEST TUBE INSERTION;  Surgeon: CRexene Alberts MD;  [place]  Service: Open Heart Surgery;  Laterality: Left;  . COLONOSCOPY  2010   "normal"  . CYSTOSCOPY  1992  . LAPAROSCOPIC OVARIAN CYSTECTOMY  1978   urethral stricture repair  . LEFT AND RIGHT HEART CATHETERIZATION WITH CORONARY ANGIOGRAM N/A 01/20/2012   Procedure: LEFT AND RIGHT HEART CATHETERIZATION WITH CORONARY ANGIOGRAM;  Surgeon: CBurnell Blanks MD;  [facility];  Service: Cardiovascular;  Laterality: N/A;  . LYMPHADENECTOMY    . MAZE  01/26/2012   Procedure: MAZE;  Surgeon: CRexene Alberts MD;  [place]  Service: Open Heart Surgery;  Laterality: N/A;  . MITRAL VALVE REPLACEMENT  01/26/2012   Procedure: MINIMALLY INVASIVE MITRAL VALVE (MV) REPLACEMENT;  Surgeon: CRexene Alberts MD;  [place] [place]  Service: Open Heart Surgery;  Laterality: Right;  . TEE WITHOUT CARDIOVERSION  01/19/2012   Procedure: TRANSESOPHAGEAL ECHOCARDIOGRAM (TEE);  Surgeon: Peter M JMartinique MD;  [facility];  Service: Cardiovascular;  Laterality: N/A;  . TONSILLECTOMY  1970  . [place] . WRIST SURGERY Right 2012    There were no vitals filed for this visit.  Subjective Assessment - 09/20/19 1401  Subjective  Patient reports overall that she is feeling stronger, walking better and can hold better posture.  She reports that the visits have been sporadic due to how long it takes her to get her walker out of the car she does not come when it is raining, and then there have been times when her schedule did not match ours    Currently in Pain?  Yes    Pain Score  3     Pain Location  [place]    Pain Orientation  Lower    Pain Descriptors / Indicators  Aching;Dull    Aggravating Factors   she reports she sat hard on the coffee table about 2 weeks ago when she lost  her balance         [facility] PT Assessment - 09/20/19 0001      Assessment   Medical Diagnosis  gait instability    Referring Provider (PT)  Howard Pouch      Standardized Balance Assessment   Standardized Balance Assessment  Timed Up and Go Test      Berg Balance Test   Sit to Stand  Able to stand  independently using hands    Standing Unsupported  Able to stand safely 2 minutes    Sitting with Back Unsupported but Feet Supported on Floor or Stool  Able to sit safely and securely 2 minutes    Stand to Sit  Controls descent by using hands    Transfers  Able to transfer safely, definite need of hands    Standing Unsupported with Eyes Closed  Able to stand 10 seconds safely    Standing Unsupported with Feet Together  Needs help to attain position but able to stand for 30 seconds with feet together    From Standing, Reach Forward with Outstretched Arm  Can reach forward >12 cm safely (5")    From Standing Position, Pick up Object from Floor  Able to pick up shoe, needs supervision    From Standing Position, Turn to Look Behind Over each Shoulder  Turn sideways only but maintains balance    Turn 360 Degrees  Able to turn 360 degrees safely but slowly    Standing Unsupported, Alternately Place Feet on Step/Stool  Able to complete 4 steps without aid or supervision    Standing Unsupported, One Foot in Front  Able to take small step independently and hold 30 seconds    Standing on One Leg  Unable to try or needs assist to prevent fall    Total Score  36      Timed Up and Go Test   Normal TUG (seconds)  62    TUG Comments  with a SPC, then perfromed with the rollator and she did in 33 seconds                   [facility] Adult PT Treatment/Exercise - 09/20/19 0001      Lumbar Exercises: Supine   Other Supine Lumbar Exercises  feet on ball K2C, trunk rotaiton, small bridges and isometric abs      Knee/Hip Exercises: Machines for Strengthening   Cybex Knee Extension  5# 2x10     Cybex Knee Flexion  20lb 3x10      Knee/Hip Exercises: Standing   Hip Flexion  Both;2 sets;10 reps    Hip Flexion Limitations  3#      Knee/Hip Exercises: Seated   Marching  Strengthening;Both;2 sets;10 reps    Marching Weights  3  lbs.               PT Short Term Goals - 08/23/19 1541      PT SHORT TERM GOAL #2   Title  Patient to decrease 5x sit to stand time to 15 seconds or less to decrease fall risk.    Status  On-going        PT Long Term Goals - 09/20/19 1647      PT LONG TERM GOAL #2   Title  Pt will amb. 500' over even/uneven terrain with LRAD at MOD I level to improve functional mobility.    Status  On-going      PT LONG TERM GOAL #3   Title  Pt will amb. 150' with SPC at MOD I level (indoors) in order to safely amb. at home.     Status  Partially Met      PT LONG TERM GOAL #4   Title  Patient able to climb stairs with a reciprocal gait pattern and BUE support    Status  On-going      PT LONG TERM GOAL #5   Title  Patient will demostrate reduced risk of falling reflected by incr Berg Balance Assessment to 39/56    Status  Partially Met            Plan - 09/20/19 1646    Clinical Impression Statement  Patient has made improvement in her ability to walk, she does regress to the small shuffling steps  wihtout cues or when making turns, she improved her berg balance test by 5 points, I performed a TUG on her today with the walker and with a cane.  Very weak core    PT Next Visit Plan  hip flexor stretches, back of the body strengthening and balance    Consulted and Agree with Plan of Care  Patient       Patient will benefit from skilled therapeutic intervention in order to improve the following deficits and impairments:  Decreased mobility, Postural dysfunction, Decreased strength, Impaired flexibility, Difficulty walking, Decreased balance  Visit Diagnosis: Muscle weakness (generalized) - Plan: PT plan of care cert/re-cert  Other abnormalities  of gait and mobility - Plan: PT plan of care cert/re-cert  Unsteadiness on feet - Plan: PT plan of care cert/re-cert     Problem List Patient Active Problem List   Diagnosis Date Noted  . Osteoporosis 07/05/2019  . Nonischemic cardiomyopathy ([place]) 07/20/2018  . Chest pain 07/20/2018  . Scoliosis 11/20/2016  . Long term current use of anticoagulant 09/18/2016  . Compression fracture of lumbar spine, non-traumatic, sequela 08/11/2016  . Multiple falls 08/11/2016  . At high risk for injury related to fall 08/11/2016  . Encounter for therapeutic drug monitoring 11/27/2013  . Hypothyroidism   . CHF (congestive heart failure) ([place])   . Paroxysmal atrial fibrillation (HCC)   . Arthritis   . Neurogenic bladder   . Warfarin anticoagulation   . First degree heart block 01/29/2012  . 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 S/P Maze operation for atrial fibrillation 01/26/2012  . Anxiety 01/18/2012  . Multiple sclerosis ([place]) 01/19/2007    Sumner Boast., PT 09/20/2019, 4:52 PM  [name] [address] [place], [place], [address] Phone: [phone]   Fax:  [phone]  Name: [name] [name] MRN: [id number] Date of Birth: November 16, 1950

## 2019-09-21 ENCOUNTER — Ambulatory Visit: Payer: Medicare Other | Admitting: Physical Therapy

## 2019-09-21 ENCOUNTER — Encounter: Payer: Self-pay | Admitting: Physical Therapy

## 2019-09-21 DIAGNOSIS — M6281 Muscle weakness (generalized): Secondary | ICD-10-CM

## 2019-09-21 DIAGNOSIS — R2681 Unsteadiness on feet: Secondary | ICD-10-CM | POA: Diagnosis not present

## 2019-09-21 DIAGNOSIS — R2689 Other abnormalities of gait and mobility: Secondary | ICD-10-CM

## 2019-09-21 NOTE — Therapy (Signed)
[place], [place], [address] Phone: [phone]   Fax:  [phone]  Physical Therapy Treatment  Patient Details  Name: [name] [name] MRN: [id number] Date of Birth: Sep 22, 1951 Referring Provider (PT): Howard Pouch   Encounter Date: 09/21/2019  PT End of Session - 09/21/19 1525    Visit Number  18    Date for PT Re-Evaluation  10/22/19    PT Start Time  1440    PT Stop Time  1530    PT Time Calculation (min)  50 min    Activity Tolerance  Patient tolerated treatment well    Behavior During Therapy  [facility] for tasks assessed/performed       Past Medical History:  Diagnosis Date  . Anxiety   . Arthritis    knees  . Breast cancer ([place]) 1999  . CHF (congestive heart failure) ([place])    Related to severe mitral regurgitation, April, 2013  . 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 Assess by pulmonary team in the hospital April, 2013  . Ejection fraction    EF 60%, echo, April, 2013, with severe MR before mitral valve replacement  . Herpes   . Hypothyroidism   . IBS (irritable bowel syndrome)   . Mitral valve regurgitation    Mitral valve replacement April, 2013, Mitral valve prolapse  . Multiple sclerosis ([place])   . Neurogenic bladder   . Osteoporosis   . Ovarian cyst   . Paroxysmal atrial fibrillation (HCC)    Rapid atrial fibrillation in-hospital, Rapid cardioversion,  before mitral valve surgery  . Pulmonary hypertension ([place])    Echo, April, 2013, before mitral valve surgery  . S/P Maze operation for atrial fibrillation 01/26/2012   Complete biatrial lesion set using cryothermy via right mini thoracotomy  . S/P mitral valve replacement 01/26/2012   33m Sorin [employer]omed[employer] Optiform mechanical prosthesis via right mini thoracotomy  . Warfarin anticoagulation    Mechanical mitral prosthesis, April, 20136    Past Surgical History:  Procedure Laterality  Date  . BREAST LUMPECTOMY Right 1999   with sent.node, and axillary dissection (20)  . CHEST TUBE INSERTION  01/26/2012   Procedure: CHEST TUBE INSERTION;  Surgeon: CRexene Alberts MD;  [place]  Service: Open Heart Surgery;  Laterality: Left;  . COLONOSCOPY  2010   "normal"  . CYSTOSCOPY  1992  . LAPAROSCOPIC OVARIAN CYSTECTOMY  1978   urethral stricture repair  . LEFT AND RIGHT HEART CATHETERIZATION WITH CORONARY ANGIOGRAM N/A 01/20/2012   Procedure: LEFT AND RIGHT HEART CATHETERIZATION WITH CORONARY ANGIOGRAM;  Surgeon: CBurnell Blanks MD;  [facility];  Service: Cardiovascular;  Laterality: N/A;  . LYMPHADENECTOMY    . MAZE  01/26/2012   Procedure: MAZE;  Surgeon: CRexene Alberts MD;  [place]  Service: Open Heart Surgery;  Laterality: N/A;  . MITRAL VALVE REPLACEMENT  01/26/2012   Procedure: MINIMALLY INVASIVE MITRAL VALVE (MV) REPLACEMENT;  Surgeon: CRexene Alberts MD;  [place] [facility]  Service: Open Heart Surgery;  Laterality: Right;  . TEE WITHOUT CARDIOVERSION  01/19/2012   Procedure: TRANSESOPHAGEAL ECHOCARDIOGRAM (TEE);  Surgeon: Peter M JMartinique MD;  [facility];  Service: Cardiovascular;  Laterality: N/A;  . TONSILLECTOMY  1970  . [place] . WRIST SURGERY Right 2012    There were no vitals filed for this visit.  Subjective Assessment - 09/21/19 1449  Subjective  Patient reports mild fatigue with the treatment and the tests that I did with her yesterday    Currently in Pain?  Yes    Pain Score  3     Pain Location  [place]                       [facility] Adult PT Treatment/Exercise - 09/21/19 0001      Lumbar Exercises: Machines for Strengthening   Cybex Knee Extension  5# 2 x 10    Cybex Knee Flexion  15# 2 x 10    Other Lumbar Machine Exercise  10# seated row, 15# lats 2x10      Lumbar Exercises: Supine   Bridge with [employer]  20 reps;1 second    Bridge with clamshell  20 reps;1 second     Bridge with [employer] Limitations  green tband    Other Supine Lumbar Exercises  feet on ball K2C, trunk rotaiton, small bridges and isometric abs      Knee/Hip Exercises: Stretches   Passive Hamstring Stretch  Both;4 reps;20 seconds    Quad Stretch  Both;3 reps;10 seconds    Hip Flexor Stretch  Both;2 reps;30 seconds    Other Knee/Hip Stretches  full body stretch with arms holding high on bar      Knee/Hip Exercises: Aerobic   Nustep  L4 x 6 min working on speed trying to get 500 steps               PT Short Term Goals - 08/23/19 1541      PT SHORT TERM GOAL #2   Title  Patient to decrease 5x sit to stand time to 15 seconds or less to decrease fall risk.    Status  On-going        PT Long Term Goals - 09/20/19 1647      PT LONG TERM GOAL #2   Title  Pt will amb. 500' over even/uneven terrain with LRAD at MOD I level to improve functional mobility.    Status  On-going      PT LONG TERM GOAL #3   Title  Pt will amb. 150' with SPC at MOD I level (indoors) in order to safely amb. at home.     Status  Partially Met      PT LONG TERM GOAL #4   Title  Patient able to climb stairs with a reciprocal gait pattern and BUE support    Status  On-going      PT LONG TERM GOAL #5   Title  Patient will demostrate reduced risk of falling reflected by incr Berg Balance Assessment to 39/56    Status  Partially Met            Plan - 09/21/19 1525    Clinical Impression Statement  Added some different strength exercises today, she had a lot of fatigue with the exericses, very difficult getting off the seated row, we focused on strength of the posterior trunk to help with gait and posture    PT Next Visit Plan  continue on the current plan    Consulted and Agree with Plan of Care  Patient       Patient will benefit from skilled therapeutic intervention in order to improve the following deficits and impairments:  Decreased mobility, Postural dysfunction, Decreased  strength, Impaired flexibility, Difficulty walking, Decreased balance  Visit Diagnosis: Muscle weakness (generalized)  Other abnormalities of gait and mobility  Unsteadiness on feet     Problem List Patient Active Problem List   Diagnosis Date Noted  . Osteoporosis 07/05/2019  . Nonischemic cardiomyopathy ([place]) 07/20/2018  . Chest pain 07/20/2018  . Scoliosis 11/20/2016  . Long term current use of anticoagulant 09/18/2016  . Compression fracture of lumbar spine, non-traumatic, sequela 08/11/2016  . Multiple falls 08/11/2016  . At high risk for injury related to fall 08/11/2016  . Encounter for therapeutic drug monitoring 11/27/2013  . Hypothyroidism   . CHF (congestive heart failure) ([place])   . Paroxysmal atrial fibrillation (HCC)   . Arthritis   . Neurogenic bladder   . Warfarin anticoagulation   . First degree heart block 01/29/2012  . 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 S/P Maze operation for atrial fibrillation 01/26/2012  . Anxiety 01/18/2012  . Multiple sclerosis ([place]) 01/19/2007    Sumner Boast., PT 09/21/2019, 3:32 PM  [place] [address] [place], [place], [address] Phone: [phone]   Fax:  [phone]  Name: [name] [name] MRN: [id number] Date of Birth: 15-Jun-1951

## 2019-09-26 ENCOUNTER — Encounter: Payer: Self-pay | Admitting: Physical Therapy

## 2019-09-26 ENCOUNTER — Ambulatory Visit: Payer: Medicare Other | Admitting: Physical Therapy

## 2019-09-26 ENCOUNTER — Other Ambulatory Visit: Payer: Self-pay

## 2019-09-26 DIAGNOSIS — M6281 Muscle weakness (generalized): Secondary | ICD-10-CM | POA: Diagnosis not present

## 2019-09-26 DIAGNOSIS — R2681 Unsteadiness on feet: Secondary | ICD-10-CM

## 2019-09-26 DIAGNOSIS — R2689 Other abnormalities of gait and mobility: Secondary | ICD-10-CM | POA: Diagnosis not present

## 2019-09-26 NOTE — Therapy (Signed)
[place], [place], [address] Phone: [phone]   Fax:  [phone]  Physical Therapy Treatment  Patient Details  Name: [name] [name] MRN: [id number] Date of Birth: [birth year] Referring Provider (PT): Howard Pouch   Encounter Date: 09/26/2019  PT End of Session - 09/26/19 1524    Visit Number  19    Number of Visits  21    Date for PT Re-Evaluation  10/22/19    PT Start Time  1519    PT Stop Time  1558    PT Time Calculation (min)  39 min    Activity Tolerance  Patient limited by fatigue       Past Medical History:  Diagnosis Date  . Anxiety   . Arthritis    knees  . Breast cancer ([place]) 1999  . CHF (congestive heart failure) ([place])    Related to severe mitral regurgitation, April, 2013  . 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 Assess by pulmonary team in the hospital April, 2013  . Ejection fraction    EF 60%, echo, April, 2013, with severe MR before mitral valve replacement  . Herpes   . Hypothyroidism   . IBS (irritable bowel syndrome)   . Mitral valve regurgitation    Mitral valve replacement April, 2013, Mitral valve prolapse  . Multiple sclerosis ([place])   . Neurogenic bladder   . Osteoporosis   . Ovarian cyst   . Paroxysmal atrial fibrillation (HCC)    Rapid atrial fibrillation in-hospital, Rapid cardioversion,  before mitral valve surgery  . Pulmonary hypertension ([place])    Echo, April, 2013, before mitral valve surgery  . S/P Maze operation for atrial fibrillation 01/26/2012   Complete biatrial lesion set using cryothermy via right mini thoracotomy  . S/P mitral valve replacement 01/26/2012   39m Sorin [employer]omed[employer] Optiform mechanical prosthesis via right mini thoracotomy  . Warfarin anticoagulation    Mechanical mitral prosthesis, April, 20136    Past Surgical History:  Procedure Laterality Date  . BREAST LUMPECTOMY Right 1999   with  sent.node, and axillary dissection (20)  . CHEST TUBE INSERTION  01/26/2012   Procedure: CHEST TUBE INSERTION;  Surgeon: CRexene Alberts MD;  [place]  Service: Open Heart Surgery;  Laterality: Left;  . COLONOSCOPY  2010   "normal"  . CYSTOSCOPY  1992  . LAPAROSCOPIC OVARIAN CYSTECTOMY  1978   urethral stricture repair  . LEFT AND RIGHT HEART CATHETERIZATION WITH CORONARY ANGIOGRAM N/A 01/20/2012   Procedure: LEFT AND RIGHT HEART CATHETERIZATION WITH CORONARY ANGIOGRAM;  Surgeon: CBurnell Blanks MD;  [facility];  Service: Cardiovascular;  Laterality: N/A;  . LYMPHADENECTOMY    . MAZE  01/26/2012   Procedure: MAZE;  Surgeon: CRexene Alberts MD;  [place] [facility]  Service: Open Heart Surgery;  Laterality: N/A;  . MITRAL VALVE REPLACEMENT  01/26/2012   Procedure: MINIMALLY INVASIVE MITRAL VALVE (MV) REPLACEMENT;  Surgeon: CRexene Alberts MD;  [place]  Service: Open Heart Surgery;  Laterality: Right;  . TEE WITHOUT CARDIOVERSION  01/19/2012   Procedure: TRANSESOPHAGEAL ECHOCARDIOGRAM (TEE);  Surgeon: Peter M JMartinique MD;  [facility];  Service: Cardiovascular;  Laterality: N/A;  . TONSILLECTOMY  1970  . U[place] . WRIST SURGERY Right 2012    There were no vitals filed for this visit.  Subjective Assessment - 09/26/19 1523  Subjective  Pt reports that her knees were very sore after her last two visits, it settled down after two days.    Patient Stated Goals  to walk independently    Currently in Pain?  No/denies                       [facility] Adult PT Treatment/Exercise - 09/26/19 0001      Lumbar Exercises: Stretches   Hip Flexor Stretch  Left;Right;3 reps   15sec in high lunge with upper body assist      Lumbar Exercises: Machines for Strengthening   Other Lumbar Machine Exercise  5#, single arm rows with straight elbow and bent. full body stretch with machine then moving into low trap work with lifting arms  off  and back.        Lumbar Exercises: Standing   Other Standing Lumbar Exercises  5 reps FWD and side reaches holding 5#      Lumbar Exercises: Seated   Long Arc Quad on Chair  10 reps   sitting on blue disc   Hip Flexion on Ball  10 reps   on blue disc   Other Seated Lumbar Exercises  10 reps sitting on blue disc, FWD/side reaches holding 5#       Lumbar Exercises: Supine   Other Supine Lumbar Exercises  feet on ball K2C, trunk rotaiton, small bridges and isometric abs      Knee/Hip Exercises: Stretches   Other Knee/Hip Stretches  full body stretch with arms holding high on bar      Knee/Hip Exercises: Aerobic   Nustep  L4 x 6 min working on speed trying to get 500 steps   had 461 steps in 6 min              PT Short Term Goals - 08/23/19 1541      PT SHORT TERM GOAL #2   Title  Patient to decrease 5x sit to stand time to 15 seconds or less to decrease fall risk.    Status  On-going        PT Long Term Goals - 09/20/19 1647      PT LONG TERM GOAL #2   Title  Pt will amb. 500' over even/uneven terrain with LRAD at MOD I level to improve functional mobility.    Status  On-going      PT LONG TERM GOAL #3   Title  Pt will amb. 150' with SPC at MOD I level (indoors) in order to safely amb. at home.     Status  Partially Met      PT LONG TERM GOAL #4   Title  Patient able to climb stairs with a reciprocal gait pattern and BUE support    Status  On-going      PT LONG TERM GOAL #5   Title  Patient will demostrate reduced risk of falling reflected by incr Berg Balance Assessment to 39/56    Status  Partially Met            Plan - 09/26/19 1555    Clinical Impression Statement  Pt did well with full body exercise, did fatigue with these.  She likes to get a big stretch with reaching high on the wt machine.  She did need close guarding with the standing work. She left the clinic with more upright posture.    Comorbidities  COPD, HTN, Osteoporosis, mitral  valve replacement 2018, breast CA, arthritis  bil knees    Rehab Potential  Good    PT Frequency  2x / week    PT Duration  8 weeks    PT Treatment/Interventions  ADLs/Self Care Home Management;Gait training;Stair training;Therapeutic activities;Therapeutic exercise;Balance training;Neuromuscular re-education;Patient/family education;Manual techniques    PT Next Visit Plan  continue on the current plan       Patient will benefit from skilled therapeutic intervention in order to improve the following deficits and impairments:  Decreased mobility, Postural dysfunction, Decreased strength, Impaired flexibility, Difficulty walking, Decreased balance  Visit Diagnosis: Muscle weakness (generalized)  Other abnormalities of gait and mobility  Unsteadiness on feet     Problem List Patient Active Problem List   Diagnosis Date Noted  . Osteoporosis 07/05/2019  . Nonischemic cardiomyopathy ([place]) 07/20/2018  . Chest pain 07/20/2018  . Scoliosis 11/20/2016  . Long term current use of anticoagulant 09/18/2016  . Compression fracture of lumbar spine, non-traumatic, sequela 08/11/2016  . Multiple falls 08/11/2016  . At high risk for injury related to fall 08/11/2016  . Encounter for therapeutic drug monitoring 11/27/2013  . Hypothyroidism   . CHF (congestive heart failure) ([place])   . Paroxysmal atrial fibrillation (HCC)   . Arthritis   . Neurogenic bladder   . Warfarin anticoagulation   . First degree heart block 01/29/2012  . 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 S/P Maze operation for atrial fibrillation 01/26/2012  . Anxiety 01/18/2012  . Multiple sclerosis ([place]) 01/19/2007    Jeral Pinch PT  09/26/2019, 3:57 PM  [place] [address] [place], [place], [address] Phone: [phone]   Fax:  [phone]  Name: [name] [name] MRN: [id number] Date of Birth: 1951/07/16

## 2019-09-27 ENCOUNTER — Encounter: Payer: Medicare Other | Admitting: Physical Therapy

## 2019-10-03 ENCOUNTER — Other Ambulatory Visit: Payer: Self-pay | Admitting: *Deleted

## 2019-10-03 ENCOUNTER — Other Ambulatory Visit: Payer: Self-pay

## 2019-10-03 ENCOUNTER — Ambulatory Visit (INDEPENDENT_AMBULATORY_CARE_PROVIDER_SITE_OTHER): Payer: Medicare Other | Admitting: *Deleted

## 2019-10-03 DIAGNOSIS — Z952 Presence of prosthetic heart valve: Secondary | ICD-10-CM | POA: Diagnosis not present

## 2019-10-03 DIAGNOSIS — I509 Heart failure, unspecified: Secondary | ICD-10-CM

## 2019-10-03 DIAGNOSIS — Z8679 Personal history of other diseases of the circulatory system: Secondary | ICD-10-CM

## 2019-10-03 DIAGNOSIS — Z5181 Encounter for therapeutic drug level monitoring: Secondary | ICD-10-CM

## 2019-10-03 DIAGNOSIS — Z9889 Other specified postprocedural states: Secondary | ICD-10-CM

## 2019-10-03 LAB — POCT INR: INR: 3.1 — AB (ref 2.0–3.0)

## 2019-10-03 MED ORDER — WARFARIN SODIUM 5 MG PO TABS: ORAL_TABLET | ORAL | 1 refills | Status: DC

## 2019-10-03 MED ORDER — LISINOPRIL 2.5 MG PO TABS: 5.0000 mg | ORAL_TABLET | Freq: Every day | ORAL | 0 refills | Status: DC

## 2019-10-03 MED ORDER — METOPROLOL TARTRATE 25 MG PO TABS: ORAL_TABLET | ORAL | 0 refills | Status: DC

## 2019-10-03 NOTE — Patient Instructions (Signed)
Description   Continue taking 1 tablet everyday. Recheck in 5 weeks. Call with any new medications or procedures [phone]

## 2019-10-04 ENCOUNTER — Encounter: Payer: Medicare Other | Admitting: Physical Therapy

## 2019-10-05 ENCOUNTER — Encounter: Payer: Self-pay | Admitting: Physical Therapy

## 2019-10-05 ENCOUNTER — Ambulatory Visit: Payer: Medicare Other | Admitting: Physical Therapy

## 2019-10-05 ENCOUNTER — Other Ambulatory Visit: Payer: Self-pay

## 2019-10-05 DIAGNOSIS — R2689 Other abnormalities of gait and mobility: Secondary | ICD-10-CM

## 2019-10-05 DIAGNOSIS — M6281 Muscle weakness (generalized): Secondary | ICD-10-CM | POA: Diagnosis not present

## 2019-10-05 DIAGNOSIS — R2681 Unsteadiness on feet: Secondary | ICD-10-CM | POA: Diagnosis not present

## 2019-10-05 NOTE — Therapy (Addendum)
[place] [address] [place], [place], [address] Phone: [phone]   Fax:  [phone] Progress Note Reporting Period 08/03/19 to 10/05/19 for visits 11-20  See note below for Objective Data and Assessment of Progress/Goals.      Physical Therapy Treatment  Patient Details  Name: [name] [name] MRN: [id number] Date of Birth: Dec 22, 1950 Referring Provider (PT): Howard Pouch   Encounter Date: 10/05/2019  PT End of Session - 10/05/19 1555    Visit Number  20    Date for PT Re-Evaluation  10/22/19    PT Start Time  1515    PT Stop Time  0158    PT Time Calculation (min)  643 min    Activity Tolerance  Patient tolerated treatment well;Patient limited by fatigue    Behavior During Therapy  [facility] for tasks assessed/performed       Past Medical History:  Diagnosis Date  . Anxiety   . Arthritis    knees  . Breast cancer ([place]) 1999  . CHF (congestive heart failure) ([place])    Related to severe mitral regurgitation, April, 2013  . 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 Assess by pulmonary team in the hospital April, 2013  . Ejection fraction    EF 60%, echo, April, 2013, with severe MR before mitral valve replacement  . Herpes   . Hypothyroidism   . IBS (irritable bowel syndrome)   . Mitral valve regurgitation    Mitral valve replacement April, 2013, Mitral valve prolapse  . Multiple sclerosis ([place])   . Neurogenic bladder   . Osteoporosis   . Ovarian cyst   . Paroxysmal atrial fibrillation (HCC)    Rapid atrial fibrillation in-hospital, Rapid cardioversion,  before mitral valve surgery  . Pulmonary hypertension ([place])    Echo, April, 2013, before mitral valve surgery  . S/P Maze operation for atrial fibrillation 01/26/2012   Complete biatrial lesion set using cryothermy via right mini thoracotomy  . S/P mitral valve replacement 01/26/2012   69m Sorin [employer]omed[employer] Optiform  mechanical prosthesis via right mini thoracotomy  . Warfarin anticoagulation    Mechanical mitral prosthesis, April, 20136    Past Surgical History:  Procedure Laterality Date  . BREAST LUMPECTOMY Right 1999   with sent.node, and axillary dissection (20)  . CHEST TUBE INSERTION  01/26/2012   Procedure: CHEST TUBE INSERTION;  Surgeon: CRexene Alberts MD;  [place] [place]  Service: Open Heart Surgery;  Laterality: Left;  . COLONOSCOPY  2010   "normal"  . CYSTOSCOPY  1992  . LAPAROSCOPIC OVARIAN CYSTECTOMY  1978   urethral stricture repair  . LEFT AND RIGHT HEART CATHETERIZATION WITH CORONARY ANGIOGRAM N/A 01/20/2012   Procedure: LEFT AND RIGHT HEART CATHETERIZATION WITH CORONARY ANGIOGRAM;  Surgeon: CBurnell Blanks MD;  [facility];  Service: Cardiovascular;  Laterality: N/A;  . LYMPHADENECTOMY    . MAZE  01/26/2012   Procedure: MAZE;  Surgeon: CRexene Alberts MD;  [facility]  Service: Open Heart Surgery;  Laterality: N/A;  . MITRAL VALVE REPLACEMENT  01/26/2012   Procedure: MINIMALLY INVASIVE MITRAL VALVE (MV) REPLACEMENT;  Surgeon: CRexene Alberts MD;  [place]  Service: Open Heart Surgery;  Laterality: Right;  . TEE WITHOUT CARDIOVERSION  01/19/2012   Procedure: TRANSESOPHAGEAL ECHOCARDIOGRAM (TEE);  Surgeon: Peter M JMartinique MD;  [facility];  Service: Cardiovascular;  Laterality: N/A;  . TONSILLECTOMY  1970  .  [place]  . WRIST SURGERY Right 2012    There were no vitals filed for this visit.  Subjective Assessment - 10/05/19 1525    Subjective  "It is going fine, my knees has been bothering me cause of the weather, today not so much"    Currently in Pain?  No/denies                       [facility] Adult PT Treatment/Exercise - 10/05/19 0001      Lumbar Exercises: Standing   Other Standing Lumbar Exercises  Overhesd ext with cane 2x10; triceps ext 15lb 2x10     Other Standing Lumbar Exercises  horiz abd  red 2x15      Lumbar Exercises: Seated   Sit to Stand  10 reps   x2 from elevated UBE seat, 2nd set added abll toss     Knee/Hip Exercises: Aerobic   Nustep  L4 x 6:45 min working on speed trying to get 500 steps      Knee/Hip Exercises: Seated   Long Arc Quad  Both;2 sets;10 reps;Strengthening    Long Arc Quad Weight  3 lbs.               PT Short Term Goals - 08/23/19 1541      PT SHORT TERM GOAL #2   Title  Patient to decrease 5x sit to stand time to 15 seconds or less to decrease fall risk.    Status  On-going        PT Long Term Goals - 09/20/19 1647      PT LONG TERM GOAL #2   Title  Pt will amb. 500' over even/uneven terrain with LRAD at MOD I level to improve functional mobility.    Status  On-going      PT LONG TERM GOAL #3   Title  Pt will amb. 150' with SPC at MOD I level (indoors) in order to safely amb. at home.     Status  Partially Met      PT LONG TERM GOAL #4   Title  Patient able to climb stairs with a reciprocal gait pattern and BUE support    Status  On-going      PT LONG TERM GOAL #5   Title  Patient will demostrate reduced risk of falling reflected by incr Berg Balance Assessment to 39/56    Status  Partially Met            Plan - 10/05/19 1556    Clinical Impression Statement  Increase fatigue noted with the more functional exercises. Pt tends to lean towards her L side bearing little weight in her RLE. No reports of increase pain. Worked more on posture today without issues only fatigue.    Comorbidities  COPD, HTN, Osteoporosis, mitral valve replacement 2018, breast CA, arthritis bil knees    Examination-Activity Limitations  Locomotion Level;Transfers;Stairs;Stand    Stability/Clinical Decision Making  Evolving/Moderate complexity    Rehab Potential  Good    PT Frequency  2x / week    PT Duration  8 weeks    PT Treatment/Interventions  ADLs/Self Care Home Management;Gait training;Stair training;Therapeutic  activities;Therapeutic exercise;Balance training;Neuromuscular re-education;Patient/family education;Manual techniques       Patient will benefit from skilled therapeutic intervention in order to improve the following deficits and impairments:  Decreased mobility, Postural dysfunction, Decreased strength, Impaired flexibility, Difficulty walking, Decreased balance  Visit Diagnosis: Unsteadiness on feet  Other  abnormalities of gait and mobility  Muscle weakness (generalized)     Problem List Patient Active Problem List   Diagnosis Date Noted  . Osteoporosis 07/05/2019  . Nonischemic cardiomyopathy ([place]) 07/20/2018  . Chest pain 07/20/2018  . Scoliosis 11/20/2016  . Long term current use of anticoagulant 09/18/2016  . Compression fracture of lumbar spine, non-traumatic, sequela 08/11/2016  . Multiple falls 08/11/2016  . At high risk for injury related to fall 08/11/2016  . Encounter for therapeutic drug monitoring 11/27/2013  . Hypothyroidism   . CHF (congestive heart failure) ([place])   . Paroxysmal atrial fibrillation (HCC)   . Arthritis   . Neurogenic bladder   . Warfarin anticoagulation   . First degree heart block 01/29/2012  . 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 S/P Maze operation for atrial fibrillation 01/26/2012  . Anxiety 01/18/2012  . Multiple sclerosis ([place]) 01/19/2007    Scot Jun, PTA 10/05/2019, 3:58 PM  [place] [place], [place], [address] Phone: [phone]   Fax:  [phone]  Name: [name] [name] MRN: [id number] Date of Birth: 08/07/1951

## 2019-10-10 ENCOUNTER — Encounter: Payer: Self-pay | Admitting: Physical Therapy

## 2019-10-10 ENCOUNTER — Ambulatory Visit: Payer: Medicare Other | Admitting: Physical Therapy

## 2019-10-10 ENCOUNTER — Other Ambulatory Visit: Payer: Self-pay

## 2019-10-10 DIAGNOSIS — M6281 Muscle weakness (generalized): Secondary | ICD-10-CM

## 2019-10-10 DIAGNOSIS — R2689 Other abnormalities of gait and mobility: Secondary | ICD-10-CM | POA: Diagnosis not present

## 2019-10-10 DIAGNOSIS — R2681 Unsteadiness on feet: Secondary | ICD-10-CM

## 2019-10-10 NOTE — Therapy (Signed)
[place] [address] [place], [place], [address] Phone: [phone]   Fax:  [phone]  Physical Therapy Treatment  Patient Details  Name: [name] [name] MRN: [id number] Date of Birth: 1951/06/11 Referring Provider (PT): Howard Pouch   Encounter Date: 10/10/2019  PT End of Session - 10/10/19 1509    Visit Number  21    Date for PT Re-Evaluation  10/22/19    PT Start Time  1430    PT Stop Time  1513    PT Time Calculation (min)  43 min    Activity Tolerance  Patient tolerated treatment well;Patient limited by fatigue    Behavior During Therapy  [facility] for tasks assessed/performed       Past Medical History:  Diagnosis Date  . Anxiety   . Arthritis    knees  . Breast cancer ([place]) 1999  . CHF (congestive heart failure) ([place])    Related to severe mitral regurgitation, April, 2013  . 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 Assess by pulmonary team in the hospital April, 2013  . Ejection fraction    EF 60%, echo, April, 2013, with severe MR before mitral valve replacement  . Herpes   . Hypothyroidism   . IBS (irritable bowel syndrome)   . Mitral valve regurgitation    Mitral valve replacement April, 2013, Mitral valve prolapse  . Multiple sclerosis ([place])   . Neurogenic bladder   . Osteoporosis   . Ovarian cyst   . Paroxysmal atrial fibrillation (HCC)    Rapid atrial fibrillation in-hospital, Rapid cardioversion,  before mitral valve surgery  . Pulmonary hypertension ([place])    Echo, April, 2013, before mitral valve surgery  . S/P Maze operation for atrial fibrillation 01/26/2012   Complete biatrial lesion set using cryothermy via right mini thoracotomy  . S/P mitral valve replacement 01/26/2012   88m Sorin [employer] Optiform mechanical prosthesis via right mini thoracotomy  . Warfarin anticoagulation    Mechanical mitral prosthesis, April, 20136    Past Surgical  History:  Procedure Laterality Date  . BREAST LUMPECTOMY Right 1999   with sent.node, and axillary dissection (20)  . CHEST TUBE INSERTION  01/26/2012   Procedure: CHEST TUBE INSERTION;  Surgeon: CRexene Alberts MD;  [place]  Service: Open Heart Surgery;  Laterality: Left;  . COLONOSCOPY  2010   "normal"  . CYSTOSCOPY  1992  . LAPAROSCOPIC OVARIAN CYSTECTOMY  1978   urethral stricture repair  . LEFT AND RIGHT HEART CATHETERIZATION WITH CORONARY ANGIOGRAM N/A 01/20/2012   Procedure: LEFT AND RIGHT HEART CATHETERIZATION WITH CORONARY ANGIOGRAM;  Surgeon: CBurnell Blanks MD;  [facility];  Service: Cardiovascular;  Laterality: N/A;  . LYMPHADENECTOMY    . MAZE  01/26/2012   Procedure: MAZE;  Surgeon: CRexene Alberts MD;  [place]  Service: Open Heart Surgery;  Laterality: N/A;  . MITRAL VALVE REPLACEMENT  01/26/2012   Procedure: MINIMALLY INVASIVE MITRAL VALVE (MV) REPLACEMENT;  Surgeon: CRexene Alberts MD;  [place]  Service: Open Heart Surgery;  Laterality: Right;  . TEE WITHOUT CARDIOVERSION  01/19/2012   Procedure: TRANSESOPHAGEAL ECHOCARDIOGRAM (TEE);  Surgeon: Peter M JMartinique MD;  [facility];  Service: Cardiovascular;  Laterality: N/A;  . TONSILLECTOMY  1970  . [place] WRIST SURGERY Right 2012    There were no vitals filed for this visit.  Subjective Assessment -  10/10/19 1431    Subjective  "pretty good, when it rained my knee acted up a little bit"    Currently in Pain?  No/denies                       [facility] Adult PT Treatment/Exercise - 10/10/19 0001      Lumbar Exercises: Supine   Bridge  2 seconds;20 reps    Other Supine Lumbar Exercises  feet on ball K2C, trunk rotaiton, small bridges and isometric abs      Knee/Hip Exercises: Stretches   Passive Hamstring Stretch  Both;4 reps;20 seconds    Other Knee/Hip Stretches  full body stretch with arms holding high on bar      Knee/Hip  Exercises: Aerobic   Nustep  L4 x 6 min working on speed trying to get 500 steps      Knee/Hip Exercises: Standing   Other Standing Knee Exercises  Alt 4in box taps with SPC, some UE assist needed at times with other UE               PT Short Term Goals - 08/23/19 1541      PT SHORT TERM GOAL #2   Title  Patient to decrease 5x sit to stand time to 15 seconds or less to decrease fall risk.    Status  On-going        PT Long Term Goals - 09/20/19 1647      PT LONG TERM GOAL #2   Title  Pt will amb. 500' over even/uneven terrain with LRAD at MOD I level to improve functional mobility.    Status  On-going      PT LONG TERM GOAL #3   Title  Pt will amb. 150' with SPC at MOD I level (indoors) in order to safely amb. at home.     Status  Partially Met      PT LONG TERM GOAL #4   Title  Patient able to climb stairs with a reciprocal gait pattern and BUE support    Status  On-going      PT LONG TERM GOAL #5   Title  Patient will demostrate reduced risk of falling reflected by incr Berg Balance Assessment to 39/56    Status  Partially Met            Plan - 10/10/19 1512    Clinical Impression Statement  focused today's on posture and stretching. Pt with a forward flexed posture, she reports a good stretch hanging from pull up bar forcing lumbar ext. Bilat HS tightness noted with passive stretching. Some instability when her LE was on the [place].    Comorbidities  COPD, HTN, Osteoporosis, mitral valve replacement 2018, breast CA, arthritis bil knees    Examination-Activity Limitations  Locomotion Level;Transfers;Stairs;Stand    Stability/Clinical Decision Making  Evolving/Moderate complexity    Rehab Potential  Good    PT Frequency  2x / week    PT Treatment/Interventions  ADLs/Self Care Home Management;Gait training;Stair training;Therapeutic activities;Therapeutic exercise;Balance training;Neuromuscular re-education;Patient/family education;Manual techniques    PT Next  Visit Plan  continue on the current plan       Patient will benefit from skilled therapeutic intervention in order to improve the following deficits and impairments:  Decreased mobility, Postural dysfunction, Decreased strength, Impaired flexibility, Difficulty walking, Decreased balance  Visit Diagnosis: Unsteadiness on feet  Other abnormalities of gait and mobility  Muscle weakness (generalized)     Problem List Patient  Active Problem List   Diagnosis Date Noted  . Osteoporosis 07/05/2019  . Nonischemic cardiomyopathy ([place]) 07/20/2018  . Chest pain 07/20/2018  . Scoliosis 11/20/2016  . Long term current use of anticoagulant 09/18/2016  . Compression fracture of lumbar spine, non-traumatic, sequela 08/11/2016  . Multiple falls 08/11/2016  . At high risk for injury related to fall 08/11/2016  . Encounter for therapeutic drug monitoring 11/27/2013  . Hypothyroidism   . CHF (congestive heart failure) ([place])   . Paroxysmal atrial fibrillation (HCC)   . Arthritis   . Neurogenic bladder   . Warfarin anticoagulation   . First degree heart block 01/29/2012  . 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 S/P Maze operation for atrial fibrillation 01/26/2012  . Anxiety 01/18/2012  . Multiple sclerosis ([place]) 01/19/2007    Scot Jun, PTA 10/10/2019, 3:15 PM  [address] [place], [place], [address] Phone: [phone]   Fax:  [phone]  Name: [name] [name] MRN: [id number] Date of Birth: 1950/11/02

## 2019-10-11 DIAGNOSIS — E039 Hypothyroidism, unspecified: Secondary | ICD-10-CM | POA: Diagnosis not present

## 2019-10-11 DIAGNOSIS — R7989 Other specified abnormal findings of blood chemistry: Secondary | ICD-10-CM | POA: Diagnosis not present

## 2019-10-11 DIAGNOSIS — G35 Multiple sclerosis: Secondary | ICD-10-CM | POA: Diagnosis not present

## 2019-10-11 DIAGNOSIS — E559 Vitamin D deficiency, unspecified: Secondary | ICD-10-CM | POA: Diagnosis not present

## 2019-10-11 DIAGNOSIS — N951 Menopausal and female climacteric states: Secondary | ICD-10-CM | POA: Diagnosis not present

## 2019-10-15 DIAGNOSIS — G35 Multiple sclerosis: Secondary | ICD-10-CM | POA: Diagnosis not present

## 2019-10-17 ENCOUNTER — Ambulatory Visit: Payer: Medicare Other | Admitting: Physical Therapy

## 2019-10-17 ENCOUNTER — Encounter: Payer: Self-pay | Admitting: Physical Therapy

## 2019-10-17 ENCOUNTER — Other Ambulatory Visit: Payer: Self-pay

## 2019-10-17 DIAGNOSIS — R2689 Other abnormalities of gait and mobility: Secondary | ICD-10-CM

## 2019-10-17 DIAGNOSIS — M6281 Muscle weakness (generalized): Secondary | ICD-10-CM | POA: Diagnosis not present

## 2019-10-17 DIAGNOSIS — R2681 Unsteadiness on feet: Secondary | ICD-10-CM

## 2019-10-17 DIAGNOSIS — Z1211 Encounter for screening for malignant neoplasm of colon: Secondary | ICD-10-CM | POA: Diagnosis not present

## 2019-10-17 NOTE — Therapy (Signed)
[place], [place], [address] Phone: [phone]   Fax:  [phone]  Physical Therapy Treatment  Patient Details  Name: [name] [name] MRN: [id number] Date of Birth: [birth year] Referring Provider (PT): Howard Pouch   Encounter Date: 10/17/2019  PT End of Session - 10/17/19 1608    Visit Number  22    Date for PT Re-Evaluation  10/22/19    PT Start Time  1520    PT Stop Time  1605    PT Time Calculation (min)  45 min    Activity Tolerance  Patient tolerated treatment well    Behavior During Therapy  [facility] for tasks assessed/performed       Past Medical History:  Diagnosis Date  . Anxiety   . Arthritis    knees  . Breast cancer ([place]) 1999  . CHF (congestive heart failure) ([place])    Related to severe mitral regurgitation, April, 2013  . 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 Assess by pulmonary team in the hospital April, 2013  . Ejection fraction    EF 60%, echo, April, 2013, with severe MR before mitral valve replacement  . Herpes   . Hypothyroidism   . IBS (irritable bowel syndrome)   . Mitral valve regurgitation    Mitral valve replacement April, 2013, Mitral valve prolapse  . Multiple sclerosis ([place])   . Neurogenic bladder   . Osteoporosis   . Ovarian cyst   . Paroxysmal atrial fibrillation (HCC)    Rapid atrial fibrillation in-hospital, Rapid cardioversion,  before mitral valve surgery  . Pulmonary hypertension ([place])    Echo, April, 2013, before mitral valve surgery  . S/P Maze operation for atrial fibrillation 01/26/2012   Complete biatrial lesion set using cryothermy via right mini thoracotomy  . S/P mitral valve replacement 01/26/2012   11m Sorin [employer] Optiform mechanical prosthesis via right mini thoracotomy  . Warfarin anticoagulation    Mechanical mitral prosthesis, April, 20136    Past Surgical History:  Procedure Laterality  Date  . BREAST LUMPECTOMY Right 1999   with sent.node, and axillary dissection (20)  . CHEST TUBE INSERTION  01/26/2012   Procedure: CHEST TUBE INSERTION;  Surgeon: CRexene Alberts MD;  [place]  Service: Open Heart Surgery;  Laterality: Left;  . COLONOSCOPY  2010   "normal"  . CYSTOSCOPY  1992  . LAPAROSCOPIC OVARIAN CYSTECTOMY  1978   urethral stricture repair  . LEFT AND RIGHT HEART CATHETERIZATION WITH CORONARY ANGIOGRAM N/A 01/20/2012   Procedure: LEFT AND RIGHT HEART CATHETERIZATION WITH CORONARY ANGIOGRAM;  Surgeon: CBurnell Blanks MD;  [facility];  Service: Cardiovascular;  Laterality: N/A;  . LYMPHADENECTOMY    . MAZE  01/26/2012   Procedure: MAZE;  Surgeon: CRexene Alberts MD;  [place]  Service: Open Heart Surgery;  Laterality: N/A;  . MITRAL VALVE REPLACEMENT  01/26/2012   Procedure: MINIMALLY INVASIVE MITRAL VALVE (MV) REPLACEMENT;  Surgeon: CRexene Alberts MD;  [place]  Service: Open Heart Surgery;  Laterality: Right;  . TEE WITHOUT CARDIOVERSION  01/19/2012   Procedure: TRANSESOPHAGEAL ECHOCARDIOGRAM (TEE);  Surgeon: Peter M JMartinique MD;  [facility];  Service: Cardiovascular;  Laterality: N/A;  . TONSILLECTOMY  1970  . [place] . WRIST SURGERY Right 2012    There were no vitals filed for this visit.  Subjective Assessment - 10/17/19 1525  Subjective  "Pretty good a little out of breath"    Currently in Pain?  No/denies                       [facility] Adult PT Treatment/Exercise - 10/17/19 0001      Lumbar Exercises: Machines for Strengthening   Cybex Knee Extension  10lb 2 x 10    Cybex Knee Flexion  20lb 2 x 10      Lumbar Exercises: Standing   Other Standing Lumbar Exercises  Overhesd ext with cane 1lb 2x10; triceps ext 15lb 2x10     Other Standing Lumbar Exercises  horiz abd red 2x15      Knee/Hip Exercises: Stretches   Other Knee/Hip Stretches  full body stretch with arms  holding high on bar      Knee/Hip Exercises: Aerobic   Nustep  L4 x 7 min working on speed trying to get 500 steps               PT Short Term Goals - 08/23/19 1541      PT SHORT TERM GOAL #2   Title  Patient to decrease 5x sit to stand time to 15 seconds or less to decrease fall risk.    Status  On-going        PT Long Term Goals - 09/20/19 1647      PT LONG TERM GOAL #2   Title  Pt will amb. 500' over even/uneven terrain with LRAD at MOD I level to improve functional mobility.    Status  On-going      PT LONG TERM GOAL #3   Title  Pt will amb. 150' with SPC at MOD I level (indoors) in order to safely amb. at home.     Status  Partially Met      PT LONG TERM GOAL #4   Title  Patient able to climb stairs with a reciprocal gait pattern and BUE support    Status  On-going      PT LONG TERM GOAL #5   Title  Patient will demostrate reduced risk of falling reflected by incr Berg Balance Assessment to 39/56    Status  Partially Met            Plan - 10/17/19 1610    Clinical Impression Statement  Pt ~ 5 minutes late. Reports a good pull with the overhead stretch. Added some resistance to overhead extensions and that made it difficult. Min assist required a time to stand during treatment sessio from different surfaces at different heights. She does fatigue quick needed frequent rest. Walked with pt out to her car after session.    Comorbidities  COPD, HTN, Osteoporosis, mitral valve replacement 2018, breast CA, arthritis bil knees    Examination-Activity Limitations  Locomotion Level;Transfers;Stairs;Stand    Examination-Participation Restrictions  Community Activity    Stability/Clinical Decision Making  Evolving/Moderate complexity    Rehab Potential  Good    PT Frequency  2x / week    PT Duration  8 weeks    PT Treatment/Interventions  ADLs/Self Care Home Management;Gait training;Stair training;Therapeutic activities;Therapeutic exercise;Balance  training;Neuromuscular re-education;Patient/family education;Manual techniques    PT Next Visit Plan  continue on the current plan       Patient will benefit from skilled therapeutic intervention in order to improve the following deficits and impairments:  Decreased mobility, Postural dysfunction, Decreased strength, Impaired flexibility, Difficulty walking, Decreased balance  Visit Diagnosis: Unsteadiness on feet  Other  abnormalities of gait and mobility  Muscle weakness (generalized)     Problem List Patient Active Problem List   Diagnosis Date Noted  . Osteoporosis 07/05/2019  . Nonischemic cardiomyopathy ([place]) 07/20/2018  . Chest pain 07/20/2018  . Scoliosis 11/20/2016  . Long term current use of anticoagulant 09/18/2016  . Compression fracture of lumbar spine, non-traumatic, sequela 08/11/2016  . Multiple falls 08/11/2016  . At high risk for injury related to fall 08/11/2016  . Encounter for therapeutic drug monitoring 11/27/2013  . Hypothyroidism   . CHF (congestive heart failure) ([place])   . Paroxysmal atrial fibrillation (HCC)   . Arthritis   . Neurogenic bladder   . Warfarin anticoagulation   . First degree heart block 01/29/2012  . 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 S/P Maze operation for atrial fibrillation 01/26/2012  . Anxiety 01/18/2012  . Multiple sclerosis ([place]) 01/19/2007    Scot Jun, PTA 10/17/2019, 4:13 PM  [address] [place], [place], [address] Phone: [phone]   Fax:  [phone]  Name: [name] [name] MRN: [id number] Date of Birth: 09-Apr-1951

## 2019-10-19 ENCOUNTER — Ambulatory Visit: Payer: Medicare Other | Admitting: Physical Therapy

## 2019-10-24 ENCOUNTER — Encounter: Payer: Self-pay | Admitting: Physical Therapy

## 2019-10-24 ENCOUNTER — Ambulatory Visit: Payer: Medicare Other | Attending: Family Medicine | Admitting: Physical Therapy

## 2019-10-24 ENCOUNTER — Other Ambulatory Visit: Payer: Self-pay

## 2019-10-24 DIAGNOSIS — R2689 Other abnormalities of gait and mobility: Secondary | ICD-10-CM | POA: Insufficient documentation

## 2019-10-24 DIAGNOSIS — R2681 Unsteadiness on feet: Secondary | ICD-10-CM | POA: Diagnosis not present

## 2019-10-24 DIAGNOSIS — M6281 Muscle weakness (generalized): Secondary | ICD-10-CM

## 2019-10-24 DIAGNOSIS — R5383 Other fatigue: Secondary | ICD-10-CM | POA: Diagnosis not present

## 2019-10-24 DIAGNOSIS — G35 Multiple sclerosis: Secondary | ICD-10-CM | POA: Diagnosis not present

## 2019-10-24 LAB — COLOGUARD: Cologuard: NEGATIVE

## 2019-10-24 NOTE — Therapy (Signed)
[name] [name], [place], [address] Phone: [phone]   Fax:  [phone]  Physical Therapy Treatment  Patient Details  Name: [name] [name] MRN: [id number] Date of Birth: 24-Jun-1951 Referring Provider (PT): Howard Pouch   Encounter Date: 10/24/2019  PT End of Session - 10/24/19 1546    Visit Number  23    Date for PT Re-Evaluation  11/24/19    PT Start Time  1441    PT Stop Time  1528    PT Time Calculation (min)  47 min    Activity Tolerance  Patient tolerated treatment well    Behavior During Therapy  [facility] for tasks assessed/performed       Past Medical History:  Diagnosis Date  . Anxiety   . Arthritis    knees  . Breast cancer ([place]) 1999  . CHF (congestive heart failure) ([place])    Related to severe mitral regurgitation, April, 2013  . 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 Assess by pulmonary team in the hospital April, 2013  . Ejection fraction    EF 60%, echo, April, 2013, with severe MR before mitral valve replacement  . Herpes   . Hypothyroidism   . IBS (irritable bowel syndrome)   . Mitral valve regurgitation    Mitral valve replacement April, 2013, Mitral valve prolapse  . Multiple sclerosis ([place])   . Neurogenic bladder   . Osteoporosis   . Ovarian cyst   . Paroxysmal atrial fibrillation (HCC)    Rapid atrial fibrillation in-hospital, Rapid cardioversion,  before mitral valve surgery  . Pulmonary hypertension ([place])    Echo, April, 2013, before mitral valve surgery  . S/P Maze operation for atrial fibrillation 01/26/2012   Complete biatrial lesion set using cryothermy via right mini thoracotomy  . S/P mitral valve replacement 01/26/2012   37m Sorin [employer] Optiform mechanical prosthesis via right mini thoracotomy  . Warfarin anticoagulation    Mechanical mitral prosthesis, April, 20136    Past Surgical History:  Procedure Laterality  Date  . BREAST LUMPECTOMY Right 1999   with sent.node, and axillary dissection (20)  . CHEST TUBE INSERTION  01/26/2012   Procedure: CHEST TUBE INSERTION;  Surgeon: CRexene Alberts MD;  [place]  Service: Open Heart Surgery;  Laterality: Left;  . COLONOSCOPY  2010   "normal"  . CYSTOSCOPY  1992  . LAPAROSCOPIC OVARIAN CYSTECTOMY  1978   urethral stricture repair  . LEFT AND RIGHT HEART CATHETERIZATION WITH CORONARY ANGIOGRAM N/A 01/20/2012   Procedure: LEFT AND RIGHT HEART CATHETERIZATION WITH CORONARY ANGIOGRAM;  Surgeon: CBurnell Blanks MD;  [facility];  Service: Cardiovascular;  Laterality: N/A;  . LYMPHADENECTOMY    . MAZE  01/26/2012   Procedure: MAZE;  Surgeon: CRexene Alberts MD;  [place]  Service: Open Heart Surgery;  Laterality: N/A;  . MITRAL VALVE REPLACEMENT  01/26/2012   Procedure: MINIMALLY INVASIVE MITRAL VALVE (MV) REPLACEMENT;  Surgeon: CRexene Alberts MD;  [place]  Service: Open Heart Surgery;  Laterality: Right;  . TEE WITHOUT CARDIOVERSION  01/19/2012   Procedure: TRANSESOPHAGEAL ECHOCARDIOGRAM (TEE);  Surgeon: Peter M JMartinique MD;  [facility];  Service: Cardiovascular;  Laterality: N/A;  . TONSILLECTOMY  1970  . [place] . WRIST SURGERY Right 2012    There were no vitals filed for this visit.  Subjective Assessment - 10/24/19 1449  Subjective  Patient reports a little stiff and the left knee is sore    Currently in Pain?  Yes    Pain Score  2     Pain Location  Knee    Pain Orientation  Left    Aggravating Factors   stairs         [facility] PT Assessment - 10/24/19 0001      Assessment   Medical Diagnosis  gait instability    Referring Provider (PT)  Howard Pouch                   [facility] Adult PT Treatment/Exercise - 10/24/19 0001      Ambulation/Gait   Gait Comments  SPC a lot of cues for posture and safety, also reverts to step length, HHA with a slight pull to get her  to go faster and take bigger steps      High Level Balance   High Level Balance Comments  in hall in corner for ball kicks, ball toss      Lumbar Exercises: Stretches   Other Lumbar Stretch Exercise  passive extnesion in sitting, passive rotaiton in sitting      Lumbar Exercises: Machines for Strengthening   Cybex Knee Extension  10lb 2 x 10    Cybex Knee Flexion  20lb 2 x 10      Lumbar Exercises: Seated   Other Seated Lumbar Exercises  seated elbow touches at hip and reaching over head with opposite for trunk motions, some small PWR type moves for trunk motions    Other Seated Lumbar Exercises  bosu behind her partial sit ups               PT Short Term Goals - 08/23/19 1541      PT SHORT TERM GOAL #2   Title  Patient to decrease 5x sit to stand time to 15 seconds or less to decrease fall risk.    Status  On-going        PT Long Term Goals - 10/24/19 1600      PT LONG TERM GOAL #1   Title  Independent with HEP for strengthening and flexibility to increase function.    Status  Achieved      PT LONG TERM GOAL #2   Title  Pt will amb. 500' over even/uneven terrain with LRAD at MOD I level to improve functional mobility.    Status  On-going      PT LONG TERM GOAL #3   Title  Pt will amb. 150' with SPC at MOD I level (indoors) in order to safely amb. at home.     Status  Partially Met      PT LONG TERM GOAL #4   Title  Patient able to climb stairs with a reciprocal gait pattern and BUE support    Status  On-going      PT LONG TERM GOAL #5   Title  Patient will demostrate reduced risk of falling reflected by incr Berg Balance Assessment to 39/56    Status  Partially Met            Plan - 10/24/19 1558    Clinical Impression Statement  Patient continues to have issues with the shuffling feet, she can correct with verbal cues but will typically revert back as she gets fatigued or has to negotitate around something, she will also revert back if she gets  distracted so she needs cues to stay on the  task of bigger steps and to correct her posture, core is weak    PT Next Visit Plan  will will work to really get her walking better and strengthen her core    Consulted and Agree with Plan of Care  Patient       Patient will benefit from skilled therapeutic intervention in order to improve the following deficits and impairments:  Decreased mobility, Postural dysfunction, Decreased strength, Impaired flexibility, Difficulty walking, Decreased balance  Visit Diagnosis: Unsteadiness on feet - Plan: PT plan of care cert/re-cert  Other abnormalities of gait and mobility - Plan: PT plan of care cert/re-cert  Muscle weakness (generalized) - Plan: PT plan of care cert/re-cert     Problem List Patient Active Problem List   Diagnosis Date Noted  . Osteoporosis 07/05/2019  . Nonischemic cardiomyopathy ([place]) 07/20/2018  . Chest pain 07/20/2018  . Scoliosis 11/20/2016  . Long term current use of anticoagulant 09/18/2016  . Compression fracture of lumbar spine, non-traumatic, sequela 08/11/2016  . Multiple falls 08/11/2016  . At high risk for injury related to fall 08/11/2016  . Encounter for therapeutic drug monitoring 11/27/2013  . Hypothyroidism   . CHF (congestive heart failure) ([place])   . Paroxysmal atrial fibrillation (HCC)   . Arthritis   . Neurogenic bladder   . Warfarin anticoagulation   . First degree heart block 01/29/2012  . 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 S/P Maze operation for atrial fibrillation 01/26/2012  . Anxiety 01/18/2012  . Multiple sclerosis ([place]) 01/19/2007    Sumner Boast., PT 10/24/2019, 4:02 PM  [address] [place], [place], [address] Phone: [phone]   Fax:  [phone]  Name: [name] [name] MRN: [id number] Date of Birth: 08/29/1951

## 2019-10-25 DIAGNOSIS — R5383 Other fatigue: Secondary | ICD-10-CM | POA: Diagnosis not present

## 2019-10-26 ENCOUNTER — Encounter: Payer: Self-pay | Admitting: Physical Therapy

## 2019-10-26 ENCOUNTER — Ambulatory Visit: Payer: Medicare Other | Admitting: Physical Therapy

## 2019-10-26 ENCOUNTER — Other Ambulatory Visit: Payer: Self-pay

## 2019-10-26 DIAGNOSIS — R2681 Unsteadiness on feet: Secondary | ICD-10-CM | POA: Diagnosis not present

## 2019-10-26 DIAGNOSIS — M6281 Muscle weakness (generalized): Secondary | ICD-10-CM | POA: Diagnosis not present

## 2019-10-26 DIAGNOSIS — R2689 Other abnormalities of gait and mobility: Secondary | ICD-10-CM | POA: Diagnosis not present

## 2019-10-26 NOTE — Therapy (Signed)
[place], [place], [address] Phone: [phone]   Fax:  [phone]  Physical Therapy Treatment  Patient Details  Name: [name] [name] MRN: [id number] Date of Birth: 09/05/1951 Referring Provider (PT): Howard Pouch   Encounter Date: 10/26/2019  PT End of Session - 10/26/19 1557    Visit Number  24    PT Start Time  6962    PT Stop Time  1559    PT Time Calculation (min)  44 min       Past Medical History:  Diagnosis Date  . Anxiety   . Arthritis    knees  . Breast cancer ([place]) 1999  . CHF (congestive heart failure) ([place])    Related to severe mitral regurgitation, April, 2013  . 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 Assess by pulmonary team in the hospital April, 2013  . Ejection fraction    EF 60%, echo, April, 2013, with severe MR before mitral valve replacement  . Herpes   . Hypothyroidism   . IBS (irritable bowel syndrome)   . Mitral valve regurgitation    Mitral valve replacement April, 2013, Mitral valve prolapse  . Multiple sclerosis ([place])   . Neurogenic bladder   . Osteoporosis   . Ovarian cyst   . Paroxysmal atrial fibrillation (HCC)    Rapid atrial fibrillation in-hospital, Rapid cardioversion,  before mitral valve surgery  . Pulmonary hypertension ([place])    Echo, April, 2013, before mitral valve surgery  . S/P Maze operation for atrial fibrillation 01/26/2012   Complete biatrial lesion set using cryothermy via right mini thoracotomy  . S/P mitral valve replacement 01/26/2012   34m Sorin [employer] Optiform mechanical prosthesis via right mini thoracotomy  . Warfarin anticoagulation    Mechanical mitral prosthesis, April, 20136    Past Surgical History:  Procedure Laterality Date  . BREAST LUMPECTOMY Right 1999   with sent.node, and axillary dissection (20)  . CHEST TUBE INSERTION  01/26/2012   Procedure: CHEST TUBE INSERTION;   Surgeon: CRexene Alberts MD;  [facility]  Service: Open Heart Surgery;  Laterality: Left;  . COLONOSCOPY  2010   "normal"  . CYSTOSCOPY  1992  . LAPAROSCOPIC OVARIAN CYSTECTOMY  1978   urethral stricture repair  . LEFT AND RIGHT HEART CATHETERIZATION WITH CORONARY ANGIOGRAM N/A 01/20/2012   Procedure: LEFT AND RIGHT HEART CATHETERIZATION WITH CORONARY ANGIOGRAM;  Surgeon: CBurnell Blanks MD;  [facility];  Service: Cardiovascular;  Laterality: N/A;  . LYMPHADENECTOMY    . MAZE  01/26/2012   Procedure: MAZE;  Surgeon: CRexene Alberts MD;  [facility]  Service: Open Heart Surgery;  Laterality: N/A;  . MITRAL VALVE REPLACEMENT  01/26/2012   Procedure: MINIMALLY INVASIVE MITRAL VALVE (MV) REPLACEMENT;  Surgeon: CRexene Alberts MD;  [facility]  Service: Open Heart Surgery;  Laterality: Right;  . TEE WITHOUT CARDIOVERSION  01/19/2012   Procedure: TRANSESOPHAGEAL ECHOCARDIOGRAM (TEE);  Surgeon: Peter M JMartinique MD;  [facility];  Service: Cardiovascular;  Laterality: N/A;  . TONSILLECTOMY  1970  . [place] . WRIST SURGERY Right 2012    There were no vitals filed for this visit.  Subjective Assessment - 10/26/19 1521    Subjective  "Pretty good but because of the weather is coming my knees are making noise"    Currently in Pain?  No/denies  [facility] Adult PT Treatment/Exercise - 10/26/19 0001      Ambulation/Gait   Gait Comments  SPC a lot of cues for posture and safety, also reverts to step length, HHA with a slight pull to get her to go faster and take bigger steps      Lumbar Exercises: Stretches   Other Lumbar Stretch Exercise  passive extnesion in sitting, passive rotaiton in sitting      Lumbar Exercises: Machines for Strengthening   Cybex Knee Extension  10lb 2 x 10    Cybex Knee Flexion  20lb 2 x 10      Lumbar Exercises: Standing   Other Standing Lumbar Exercises  triceps ext 15lb  2x10     Other Standing Lumbar Exercises  horiz abd yellow 2x10 with passive postyral ext       Lumbar Exercises: Seated   Other Seated Lumbar Exercises  bosu behind her partial sit ups, back Extensions with blue band 2x10       Knee/Hip Exercises: Aerobic   Nustep  L4 x 7 min working on speed trying to get 500 steps    Other Aerobic  attempted 20 seconds on elliptical 20 sec               PT Short Term Goals - 08/23/19 1541      PT SHORT TERM GOAL #2   Title  Patient to decrease 5x sit to stand time to 15 seconds or less to decrease fall risk.    Status  On-going        PT Long Term Goals - 10/24/19 1600      PT LONG TERM GOAL #1   Title  Independent with HEP for strengthening and flexibility to increase function.    Status  Achieved      PT LONG TERM GOAL #2   Title  Pt will amb. 500' over even/uneven terrain with LRAD at MOD I level to improve functional mobility.    Status  On-going      PT LONG TERM GOAL #3   Title  Pt will amb. 150' with SPC at MOD I level (indoors) in order to safely amb. at home.     Status  Partially Met      PT LONG TERM GOAL #4   Title  Patient able to climb stairs with a reciprocal gait pattern and BUE support    Status  On-going      PT LONG TERM GOAL #5   Title  Patient will demostrate reduced risk of falling reflected by incr Berg Balance Assessment to 39/56    Status  Partially Met            Plan - 10/26/19 1559    Clinical Impression Statement  Shuffling feet continues with gait. Pt given multiple cues to take bigger steps. She comes to a complete stop before turning while using SPC. Attempted elliptical but pt was only able to tolerated 20 seconds on lowers setting. Cues for full ROM with leg curls and extensions.    Personal Factors and Comorbidities  Age;Fitness;Comorbidity 3+    Comorbidities  COPD, HTN, Osteoporosis, mitral valve replacement 2018, breast CA, arthritis bil knees    Examination-Activity Limitations   Locomotion Level;Transfers;Stairs;Stand    Examination-Participation Restrictions  Community Activity    Stability/Clinical Decision Making  Evolving/Moderate complexity    Rehab Potential  Good    PT Duration  8 weeks    PT Treatment/Interventions  ADLs/Self Care Home Management;Gait training;Stair  training;Therapeutic activities;Therapeutic exercise;Balance training;Neuromuscular re-education;Patient/family education;Manual techniques    PT Next Visit Plan  will will work to really get her walking better and strengthen her core       Patient will benefit from skilled therapeutic intervention in order to improve the following deficits and impairments:  Decreased mobility, Postural dysfunction, Decreased strength, Impaired flexibility, Difficulty walking, Decreased balance  Visit Diagnosis: Other abnormalities of gait and mobility  Muscle weakness (generalized)     Problem List Patient Active Problem List   Diagnosis Date Noted  . Osteoporosis 07/05/2019  . Nonischemic cardiomyopathy ([place]) 07/20/2018  . Chest pain 07/20/2018  . Scoliosis 11/20/2016  . Long term current use of anticoagulant 09/18/2016  . Compression fracture of lumbar spine, non-traumatic, sequela 08/11/2016  . Multiple falls 08/11/2016  . At high risk for injury related to fall 08/11/2016  . Encounter for therapeutic drug monitoring 11/27/2013  . Hypothyroidism   . CHF (congestive heart failure) ([place])   . Paroxysmal atrial fibrillation (HCC)   . Arthritis   . Neurogenic bladder   . Warfarin anticoagulation   . First degree heart block 01/29/2012  . 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 S/P Maze operation for atrial fibrillation 01/26/2012  . Anxiety 01/18/2012  . Multiple sclerosis ([place]) 01/19/2007    Scot Jun, PTA 10/26/2019, 4:04 PM  [place], [place], [address] Phone: [phone]   Fax:   [phone]  Name: [name] [name] MRN: [id number] Date of Birth: [birth year]

## 2019-10-29 DIAGNOSIS — R5383 Other fatigue: Secondary | ICD-10-CM | POA: Diagnosis not present

## 2019-10-29 DIAGNOSIS — G35 Multiple sclerosis: Secondary | ICD-10-CM | POA: Diagnosis not present

## 2019-10-31 ENCOUNTER — Other Ambulatory Visit: Payer: Self-pay

## 2019-10-31 ENCOUNTER — Ambulatory Visit: Payer: Medicare Other | Admitting: Physical Therapy

## 2019-10-31 ENCOUNTER — Encounter: Payer: Self-pay | Admitting: Physical Therapy

## 2019-10-31 DIAGNOSIS — M6281 Muscle weakness (generalized): Secondary | ICD-10-CM | POA: Diagnosis not present

## 2019-10-31 DIAGNOSIS — R2681 Unsteadiness on feet: Secondary | ICD-10-CM | POA: Diagnosis not present

## 2019-10-31 DIAGNOSIS — R2689 Other abnormalities of gait and mobility: Secondary | ICD-10-CM

## 2019-10-31 NOTE — Therapy (Signed)
[place], [place], [address] Phone: [phone]   Fax:  [phone]  Physical Therapy Treatment  Patient Details  Name: [name] [name] MRN: [id number] Date of Birth: [birth year] Referring Provider (PT): Howard Pouch   Encounter Date: 10/31/2019  PT End of Session - 10/31/19 1539    Visit Number  25    Date for PT Re-Evaluation  11/24/19    PT Start Time  1445    PT Stop Time  1534    PT Time Calculation (min)  49 min    Activity Tolerance  Patient tolerated treatment well    Behavior During Therapy  [facility] for tasks assessed/performed       Past Medical History:  Diagnosis Date  . Anxiety   . Arthritis    knees  . Breast cancer ([place]) 1999  . CHF (congestive heart failure) ([place])    Related to severe mitral regurgitation, April, 2013  . 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 Assess by pulmonary team in the hospital April, 2013  . Ejection fraction    EF 60%, echo, April, 2013, with severe MR before mitral valve replacement  . Herpes   . Hypothyroidism   . IBS (irritable bowel syndrome)   . Mitral valve regurgitation    Mitral valve replacement April, 2013, Mitral valve prolapse  . Multiple sclerosis ([place])   . Neurogenic bladder   . Osteoporosis   . Ovarian cyst   . Paroxysmal atrial fibrillation (HCC)    Rapid atrial fibrillation in-hospital, Rapid cardioversion,  before mitral valve surgery  . Pulmonary hypertension ([place])    Echo, April, 2013, before mitral valve surgery  . S/P Maze operation for atrial fibrillation 01/26/2012   Complete biatrial lesion set using cryothermy via right mini thoracotomy  . S/P mitral valve replacement 01/26/2012   47m Sorin [employer] Optiform mechanical prosthesis via right mini thoracotomy  . Warfarin anticoagulation    Mechanical mitral prosthesis, April, 20136    Past Surgical History:  Procedure Laterality  Date  . BREAST LUMPECTOMY Right 1999   with sent.node, and axillary dissection (20)  . CHEST TUBE INSERTION  01/26/2012   Procedure: CHEST TUBE INSERTION;  Surgeon: CRexene Alberts MD;  [facility]  Service: Open Heart Surgery;  Laterality: Left;  . COLONOSCOPY  2010   "normal"  . CYSTOSCOPY  1992  . LAPAROSCOPIC OVARIAN CYSTECTOMY  1978   urethral stricture repair  . LEFT AND RIGHT HEART CATHETERIZATION WITH CORONARY ANGIOGRAM N/A 01/20/2012   Procedure: LEFT AND RIGHT HEART CATHETERIZATION WITH CORONARY ANGIOGRAM;  Surgeon: CBurnell Blanks MD;  [facility];  Service: Cardiovascular;  Laterality: N/A;  . LYMPHADENECTOMY    . MAZE  01/26/2012   Procedure: MAZE;  Surgeon: CRexene Alberts MD;  [place] [facility]  Service: Open Heart Surgery;  Laterality: N/A;  . MITRAL VALVE REPLACEMENT  01/26/2012   Procedure: MINIMALLY INVASIVE MITRAL VALVE (MV) REPLACEMENT;  Surgeon: CRexene Alberts MD;  [place]  Service: Open Heart Surgery;  Laterality: Right;  . TEE WITHOUT CARDIOVERSION  01/19/2012   Procedure: TRANSESOPHAGEAL ECHOCARDIOGRAM (TEE);  Surgeon: Peter M JMartinique MD;  [facility];  Service: Cardiovascular;  Laterality: N/A;  . TONSILLECTOMY  1970  . [place] . WRIST SURGERY Right 2012    There were no vitals filed for this visit.  Subjective Assessment - 10/31/19 1451  Subjective  left knee is hurting today    Currently in Pain?  Yes    Pain Score  3     Pain Location  Knee    Pain Orientation  Left    Aggravating Factors   unsure, "just arthritis                       [facility] Adult PT Treatment/Exercise - 10/31/19 0001      Ambulation/Gait   Gait Comments  HHA x 120 feet with some cues for step length, to pick up feet and for posture, she did really well with this after the stretching      Lumbar Exercises: Standing   Other Standing Lumbar Exercises  arms up to the pull up bar for nice passive traction and  thoracis extension.      Lumbar Exercises: Seated   Other Seated Lumbar Exercises  seated elbow touches at hip and reaching over head with opposite for trunk motions, some small PWR type moves for trunk motions    Other Seated Lumbar Exercises  bosu behind her partial sit ups, back Extensions with blue band 2x10 , some passive thoracic extension and trunk rotation      Knee/Hip Exercises: Aerobic   Nustep  L4 x 7 min working on speed trying to get 500 steps      Knee/Hip Exercises: Machines for Strengthening   Cybex Knee Extension  5# 2x10    Cybex Knee Flexion  20lb 3x10               PT Short Term Goals - 08/23/19 1541      PT SHORT TERM GOAL #2   Title  Patient to decrease 5x sit to stand time to 15 seconds or less to decrease fall risk.    Status  On-going        PT Long Term Goals - 10/24/19 1600      PT LONG TERM GOAL #1   Title  Independent with HEP for strengthening and flexibility to increase function.    Status  Achieved      PT LONG TERM GOAL #2   Title  Pt will amb. 500' over even/uneven terrain with LRAD at MOD I level to improve functional mobility.    Status  On-going      PT LONG TERM GOAL #3   Title  Pt will amb. 150' with SPC at MOD I level (indoors) in order to safely amb. at home.     Status  Partially Met      PT LONG TERM GOAL #4   Title  Patient able to climb stairs with a reciprocal gait pattern and BUE support    Status  On-going      PT LONG TERM GOAL #5   Title  Patient will demostrate reduced risk of falling reflected by incr Berg Balance Assessment to 39/56    Status  Partially Met            Plan - 10/31/19 1539    Clinical Impression Statement  Patient did great with the HHA walking, much improved step length and posture, still some cues for this and to walk smoothly, this 120' walk was very tiring for her.  She did have some increased c/o left knee pain today    PT Next Visit Plan  continue to work on core and walking for  increased safety and function    Consulted and Agree with Plan of  Care  Patient       Patient will benefit from skilled therapeutic intervention in order to improve the following deficits and impairments:  Decreased mobility, Postural dysfunction, Decreased strength, Impaired flexibility, Difficulty walking, Decreased balance  Visit Diagnosis: Other abnormalities of gait and mobility  Muscle weakness (generalized)  Unsteadiness on feet     Problem List Patient Active Problem List   Diagnosis Date Noted  . Osteoporosis 07/05/2019  . Nonischemic cardiomyopathy ([place]) 07/20/2018  . Chest pain 07/20/2018  . Scoliosis 11/20/2016  . Long term current use of anticoagulant 09/18/2016  . Compression fracture of lumbar spine, non-traumatic, sequela 08/11/2016  . Multiple falls 08/11/2016  . At high risk for injury related to fall 08/11/2016  . Encounter for therapeutic drug monitoring 11/27/2013  . Hypothyroidism   . CHF (congestive heart failure) ([place])   . Paroxysmal atrial fibrillation (HCC)   . Arthritis   . Neurogenic bladder   . Warfarin anticoagulation   . First degree heart block 01/29/2012  . 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 S/P Maze operation for atrial fibrillation 01/26/2012  . Anxiety 01/18/2012  . Multiple sclerosis ([place]) 01/19/2007    Sumner Boast., PT 10/31/2019, 3:42 PM  [place], [place], [address] Phone: [phone]   Fax:  [phone]  Name: [name] [name] MRN: [id number] Date of Birth: 01-Dec-1950

## 2019-11-02 ENCOUNTER — Other Ambulatory Visit: Payer: Self-pay

## 2019-11-02 ENCOUNTER — Telehealth: Payer: Self-pay | Admitting: Neurology

## 2019-11-02 ENCOUNTER — Encounter: Payer: Self-pay | Admitting: Physical Therapy

## 2019-11-02 ENCOUNTER — Ambulatory Visit: Payer: Medicare Other | Admitting: Physical Therapy

## 2019-11-02 DIAGNOSIS — M6281 Muscle weakness (generalized): Secondary | ICD-10-CM | POA: Diagnosis not present

## 2019-11-02 DIAGNOSIS — G35 Multiple sclerosis: Secondary | ICD-10-CM

## 2019-11-02 DIAGNOSIS — R2681 Unsteadiness on feet: Secondary | ICD-10-CM | POA: Diagnosis not present

## 2019-11-02 DIAGNOSIS — R2689 Other abnormalities of gait and mobility: Secondary | ICD-10-CM | POA: Diagnosis not present

## 2019-11-02 NOTE — Addendum Note (Signed)
Addended by: Elspeth Cho R on: 11/02/2019 01:05 PM   Modules accepted: Orders

## 2019-11-02 NOTE — Therapy (Signed)
[place], [place], [address] Phone: [phone]   Fax:  [phone]  Physical Therapy Treatment  Patient Details  Name: [name] [name] MRN: [id number] Date of Birth: 07/20/1951 Referring Provider (PT): Howard Pouch   Encounter Date: 11/02/2019  PT End of Session - 11/02/19 1559    Visit Number  26    Date for PT Re-Evaluation  11/24/19    PT Start Time  1515    PT Stop Time  1600    PT Time Calculation (min)  45 min    Activity Tolerance  Patient tolerated treatment well    Behavior During Therapy  [facility] for tasks assessed/performed       Past Medical History:  Diagnosis Date  . Anxiety   . Arthritis    knees  . Breast cancer ([place]) 1999  . CHF (congestive heart failure) ([place])    Related to severe mitral regurgitation, April, 2013  . 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 Assess by pulmonary team in the hospital April, 2013  . Ejection fraction    EF 60%, echo, April, 2013, with severe MR before mitral valve replacement  . Herpes   . Hypothyroidism   . IBS (irritable bowel syndrome)   . Mitral valve regurgitation    Mitral valve replacement April, 2013, Mitral valve prolapse  . Multiple sclerosis ([place])   . Neurogenic bladder   . Osteoporosis   . Ovarian cyst   . Paroxysmal atrial fibrillation (HCC)    Rapid atrial fibrillation in-hospital, Rapid cardioversion,  before mitral valve surgery  . Pulmonary hypertension ([place])    Echo, April, 2013, before mitral valve surgery  . S/P Maze operation for atrial fibrillation 01/26/2012   Complete biatrial lesion set using cryothermy via right mini thoracotomy  . S/P mitral valve replacement 01/26/2012   48m Sorin [employer] Optiform mechanical prosthesis via right mini thoracotomy  . Warfarin anticoagulation    Mechanical mitral prosthesis, April, 20136    Past Surgical History:  Procedure Laterality  Date  . BREAST LUMPECTOMY Right 1999   with sent.node, and axillary dissection (20)  . CHEST TUBE INSERTION  01/26/2012   Procedure: CHEST TUBE INSERTION;  Surgeon: CRexene Alberts MD;  [facility]  Service: Open Heart Surgery;  Laterality: Left;  . COLONOSCOPY  2010   "normal"  . CYSTOSCOPY  1992  . LAPAROSCOPIC OVARIAN CYSTECTOMY  1978   urethral stricture repair  . LEFT AND RIGHT HEART CATHETERIZATION WITH CORONARY ANGIOGRAM N/A 01/20/2012   Procedure: LEFT AND RIGHT HEART CATHETERIZATION WITH CORONARY ANGIOGRAM;  Surgeon: CBurnell Blanks MD;  [facility];  Service: Cardiovascular;  Laterality: N/A;  . LYMPHADENECTOMY    . MAZE  01/26/2012   Procedure: MAZE;  Surgeon: CRexene Alberts MD;  Location: [facility]  Service: Open Heart Surgery;  Laterality: N/A;  . MITRAL VALVE REPLACEMENT  01/26/2012   Procedure: MINIMALLY INVASIVE MITRAL VALVE (MV) REPLACEMENT;  Surgeon: CRexene Alberts MD;  [facility]  Service: Open Heart Surgery;  Laterality: Right;  . TEE WITHOUT CARDIOVERSION  01/19/2012   Procedure: TRANSESOPHAGEAL ECHOCARDIOGRAM (TEE);  Surgeon: Peter M JMartinique MD;  [facility];  Service: Cardiovascular;  Laterality: N/A;  . TONSILLECTOMY  1970  . [place] . WRIST [place]GERY Right 2012    There were no vitals filed for this visit.  Subjective Assessment - 11/02/19 1521  Subjective  "Pretty good"    Currently in Pain?  Yes    Pain Score  1     Pain Location  Knee    Pain Orientation  Left                       [facility] Adult PT Treatment/Exercise - 11/02/19 0001      Lumbar Exercises: Standing   Other Standing Lumbar Exercises  arms up to the pull up bar for nice passive traction and thoracis extension.      Lumbar Exercises: Seated   Other Seated Lumbar Exercises  Horiz abd yellow with passive postural stretching 2x10     Other Seated Lumbar Exercises  bosu behind her partial sit ups, back Extensions with  blue band 2x10 , some passive thoracic extension and trunk rotation      Knee/Hip Exercises: Aerobic   Nustep  L4 x 6 min working on speed trying to get 500 steps      Knee/Hip Exercises: Machines for Strengthening   Cybex Knee Extension  5# 2x10    Cybex Knee Flexion  25lb 2x10      Knee/Hip Exercises: Standing   Other Standing Knee Exercises  Alt 4 in taps with SPC 2x3, Stagard  stance on 4in 3sex x 3 each with spc               PT Short Term Goals - 08/23/19 1541      PT SHORT TERM GOAL #2   Title  Patient to decrease 5x sit to stand time to 15 seconds or less to decrease fall risk.    Status  On-going        PT Long Term Goals - 10/24/19 1600      PT LONG TERM GOAL #1   Title  Independent with HEP for strengthening and flexibility to increase function.    Status  Achieved      PT LONG TERM GOAL #2   Title  Pt will amb. 500' over even/uneven terrain with LRAD at MOD I level to improve functional mobility.    Status  On-going      PT LONG TERM GOAL #3   Title  Pt will amb. 150' with SPC at MOD I level (indoors) in order to safely amb. at home.     Status  Partially Met      PT LONG TERM GOAL #4   Title  Patient able to climb stairs with a reciprocal gait pattern and BUE support    Status  On-going      PT LONG TERM GOAL #5   Title  Patient will demostrate reduced risk of falling reflected by incr Berg Balance Assessment to 39/56    Status  Partially Met            Plan - 11/02/19 1600    Clinical Impression Statement  Cues needed to pick up feet with gait. increase resistance tolerated with seated leg curls. No reports of pain. Added as slight anterior push to hanging stretches. CGA needed with all stepping activities.    Comorbidities  COPD, HTN, Osteoporosis, mitral valve replacement 2018, breast CA, arthritis bil knees    Examination-Activity Limitations  Locomotion Level;Transfers;Stairs;Stand    Examination-Participation Restrictions  Community  Activity    Stability/Clinical Decision Making  Evolving/Moderate complexity    Rehab Potential  Good    PT Frequency  2x / week    PT Duration  8 weeks  PT Treatment/Interventions  ADLs/Self Care Home Management;Gait training;Stair training;Therapeutic activities;Therapeutic exercise;Balance training;Neuromuscular re-education;Patient/family education;Manual techniques    PT Next Visit Plan  continue to work on core and walking for increased safety and function       Patient will benefit from skilled therapeutic intervention in order to improve the following deficits and impairments:  Decreased mobility, Postural dysfunction, Decreased strength, Impaired flexibility, Difficulty walking, Decreased balance  Visit Diagnosis: Muscle weakness (generalized)     Problem List Patient Active Problem List   Diagnosis Date Noted  . Osteoporosis 07/05/2019  . Nonischemic cardiomyopathy ([place]) 07/20/2018  . Chest pain 07/20/2018  . Scoliosis 11/20/2016  . Long term current use of anticoagulant 09/18/2016  . Compression fracture of lumbar spine, non-traumatic, sequela 08/11/2016  . Multiple falls 08/11/2016  . At high risk for injury related to fall 08/11/2016  . Encounter for therapeutic drug monitoring 11/27/2013  . Hypothyroidism   . CHF (congestive heart failure) ([place])   . Paroxysmal atrial fibrillation (HCC)   . Arthritis   . Neurogenic bladder   . Warfarin anticoagulation   . First degree heart block 01/29/2012  . 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 S/P Maze operation for atrial fibrillation 01/26/2012  . Anxiety 01/18/2012  . Multiple sclerosis ([place]) 01/19/2007    Scot Jun, PTA 11/02/2019, 4:06 PM  [place] [address] [place], [place], [address] Phone: [phone]   Fax:  [phone]  Name: [name] [name] MRN: [id number] Date of Birth: May 06, 1951

## 2019-11-02 NOTE — Telephone Encounter (Signed)
Patient has an upcoming MRI appointment December 13, 2019. She'd like to be sure this is done without contrast.

## 2019-11-02 NOTE — Telephone Encounter (Signed)
Please advise 

## 2019-11-02 NOTE — Telephone Encounter (Signed)
Please change order to without contrast

## 2019-11-03 DIAGNOSIS — G35 Multiple sclerosis: Secondary | ICD-10-CM | POA: Diagnosis not present

## 2019-11-03 DIAGNOSIS — R5383 Other fatigue: Secondary | ICD-10-CM | POA: Diagnosis not present

## 2019-11-06 ENCOUNTER — Other Ambulatory Visit: Payer: Self-pay

## 2019-11-06 ENCOUNTER — Ambulatory Visit: Payer: Medicare Other | Admitting: Physical Therapy

## 2019-11-06 ENCOUNTER — Encounter: Payer: Self-pay | Admitting: Physical Therapy

## 2019-11-06 DIAGNOSIS — M6281 Muscle weakness (generalized): Secondary | ICD-10-CM

## 2019-11-06 DIAGNOSIS — R2689 Other abnormalities of gait and mobility: Secondary | ICD-10-CM

## 2019-11-06 DIAGNOSIS — R2681 Unsteadiness on feet: Secondary | ICD-10-CM | POA: Diagnosis not present

## 2019-11-06 NOTE — Therapy (Signed)
[place], [place], [address] Phone: [phone]   Fax:  [phone]  Physical Therapy Treatment  Patient Details  Name: [name] [name] MRN: [id number] Date of Birth: 1951/05/30 Referring Provider (PT): Howard Pouch   Encounter Date: 11/06/2019  PT End of Session - 11/06/19 1556    Visit Number  27    Date for PT Re-Evaluation  11/24/19    PT Start Time  9937    PT Stop Time  1558    PT Time Calculation (min)  35 min       Past Medical History:  Diagnosis Date  . Anxiety   . Arthritis    knees  . Breast cancer ([place]) 1999  . CHF (congestive heart failure) ([place])    Related to severe mitral regurgitation, April, 2013  . 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 Assess by pulmonary team in the hospital April, 2013  . Ejection fraction    EF 60%, echo, April, 2013, with severe MR before mitral valve replacement  . Herpes   . Hypothyroidism   . IBS (irritable bowel syndrome)   . Mitral valve regurgitation    Mitral valve replacement April, 2013, Mitral valve prolapse  . Multiple sclerosis ([place])   . Neurogenic bladder   . Osteoporosis   . Ovarian cyst   . Paroxysmal atrial fibrillation (HCC)    Rapid atrial fibrillation in-hospital, Rapid cardioversion,  before mitral valve surgery  . Pulmonary hypertension ([place])    Echo, April, 2013, before mitral valve surgery  . S/P Maze operation for atrial fibrillation 01/26/2012   Complete biatrial lesion set using cryothermy via right mini thoracotomy  . S/P mitral valve replacement 01/26/2012   44m Sorin [employer] Optiform mechanical prosthesis via right mini thoracotomy  . Warfarin anticoagulation    Mechanical mitral prosthesis, April, 20136    Past Surgical History:  Procedure Laterality Date  . BREAST LUMPECTOMY Right 1999   with sent.node, and axillary dissection (20)  . CHEST TUBE INSERTION  01/26/2012    Procedure: CHEST TUBE INSERTION;  Surgeon: CRexene Alberts MD;  [facility]  Service: Open Heart Surgery;  Laterality: Left;  . COLONOSCOPY  2010   "normal"  . CYSTOSCOPY  1992  . LAPAROSCOPIC OVARIAN CYSTECTOMY  1978   urethral stricture repair  . LEFT AND RIGHT HEART CATHETERIZATION WITH CORONARY ANGIOGRAM N/A 01/20/2012   Procedure: LEFT AND RIGHT HEART CATHETERIZATION WITH CORONARY ANGIOGRAM;  Surgeon: CBurnell Blanks MD;  [facility];  Service: Cardiovascular;  Laterality: N/A;  . LYMPHADENECTOMY    . MAZE  01/26/2012   Procedure: MAZE;  Surgeon: CRexene Alberts MD;  [place] [facility]  Service: Open Heart Surgery;  Laterality: N/A;  . MITRAL VALVE REPLACEMENT  01/26/2012   Procedure: MINIMALLY INVASIVE MITRAL VALVE (MV) REPLACEMENT;  Surgeon: CRexene Alberts MD;  [facility]  Service: Open Heart Surgery;  Laterality: Right;  . TEE WITHOUT CARDIOVERSION  01/19/2012   Procedure: TRANSESOPHAGEAL ECHOCARDIOGRAM (TEE);  Surgeon: Peter M JMartinique MD;  [facility];  Service: Cardiovascular;  Laterality: N/A;  . TONSILLECTOMY  1970  . [place] . WRIST SURGERY Right 2012    There were no vitals filed for this visit.  Subjective Assessment - 11/06/19 1526    Subjective  "Pretty good" "My L knee is still bugging me a little bit"    Currently  in Pain?  No/denies                       [facility] Adult PT Treatment/Exercise - 11/06/19 0001      Lumbar Exercises: Standing   Other Standing Lumbar Exercises  arms up to the pull up bar for nice passive traction and thoracis extension.      Knee/Hip Exercises: Aerobic   Nustep  L4 x 6 min working on speed trying to get 500 steps      Knee/Hip Exercises: Machines for Strengthening   Cybex Knee Extension  5# 3x10    Cybex Knee Flexion  25lb 3x10      Knee/Hip Exercises: Seated   Sit to Sand  2 sets;5 reps;with UE support   airex in blue chair, NO UE with decents                PT Short Term Goals - 08/23/19 1541      PT SHORT TERM GOAL #2   Title  Patient to decrease 5x sit to stand time to 15 seconds or less to decrease fall risk.    Status  On-going        PT Long Term Goals - 10/24/19 1600      PT LONG TERM GOAL #1   Title  Independent with HEP for strengthening and flexibility to increase function.    Status  Achieved      PT LONG TERM GOAL #2   Title  Pt will amb. 500' over even/uneven terrain with LRAD at MOD I level to improve functional mobility.    Status  On-going      PT LONG TERM GOAL #3   Title  Pt will amb. 150' with SPC at MOD I level (indoors) in order to safely amb. at home.     Status  Partially Met      PT LONG TERM GOAL #4   Title  Patient able to climb stairs with a reciprocal gait pattern and BUE support    Status  On-going      PT LONG TERM GOAL #5   Title  Patient will demostrate reduced risk of falling reflected by incr Berg Balance Assessment to 39/56    Status  Partially Met            Plan - 11/06/19 1557    Clinical Impression Statement  Pt ~ 8 minutes late for today's session. Pt ambulated around clinic with [facility] and with AD but SBA needed. No reports of pain. She continues to report the effectives of the overhead stretch. Instability noted with box taps. She does hesitated wen having to step with the LLE .    Comorbidities  COPD, HTN, Osteoporosis, mitral valve replacement 2018, breast CA, arthritis bil knees    Examination-Activity Limitations  Locomotion Level;Transfers;Stairs;Stand    Examination-Participation Restrictions  Community Activity    Stability/Clinical Decision Making  Evolving/Moderate complexity    Rehab Potential  Good    PT Frequency  2x / week    PT Duration  8 weeks    PT Treatment/Interventions  ADLs/Self Care Home Management;Gait training;Stair training;Therapeutic activities;Therapeutic exercise;Balance training;Neuromuscular re-education;Patient/family  education;Manual techniques    PT Next Visit Plan  continue to work on core and walking for increased safety and function       Patient will benefit from skilled therapeutic intervention in order to improve the following deficits and impairments:  Decreased mobility, Postural dysfunction, Decreased strength, Impaired flexibility, Difficulty  walking, Decreased balance  Visit Diagnosis: Muscle weakness (generalized)  Other abnormalities of gait and mobility     Problem List Patient Active Problem List   Diagnosis Date Noted  . Osteoporosis 07/05/2019  . Nonischemic cardiomyopathy ([place]) 07/20/2018  . Chest pain 07/20/2018  . Scoliosis 11/20/2016  . Long term current use of anticoagulant 09/18/2016  . Compression fracture of lumbar spine, non-traumatic, sequela 08/11/2016  . Multiple falls 08/11/2016  . At high risk for injury related to fall 08/11/2016  . Encounter for therapeutic drug monitoring 11/27/2013  . Hypothyroidism   . CHF (congestive heart failure) ([place])   . Paroxysmal atrial fibrillation (HCC)   . Arthritis   . Neurogenic bladder   . Warfarin anticoagulation   . First degree heart block 01/29/2012  . 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 S/P Maze operation for atrial fibrillation 01/26/2012  . Anxiety 01/18/2012  . Multiple sclerosis ([employer]) 01/19/2007    Scot Jun, PTA 11/06/2019, 4:02 PM  [place] [address] [place], [place], [address] Phone: [phone]   Fax:  [phone]  Name: KAELEN BRENNAN MRN: [id number] Date of Birth: [birth year]

## 2019-11-08 ENCOUNTER — Other Ambulatory Visit: Payer: Self-pay

## 2019-11-08 ENCOUNTER — Ambulatory Visit: Payer: Medicare Other | Admitting: Physical Therapy

## 2019-11-08 ENCOUNTER — Encounter: Payer: Self-pay | Admitting: Physical Therapy

## 2019-11-08 DIAGNOSIS — M6281 Muscle weakness (generalized): Secondary | ICD-10-CM | POA: Diagnosis not present

## 2019-11-08 DIAGNOSIS — R2689 Other abnormalities of gait and mobility: Secondary | ICD-10-CM | POA: Diagnosis not present

## 2019-11-08 DIAGNOSIS — R2681 Unsteadiness on feet: Secondary | ICD-10-CM

## 2019-11-08 NOTE — Therapy (Signed)
[place], [place], [address] Phone: [phone]   Fax:  [phone]  Physical Therapy Treatment  Patient Details  Name: [name] [name] MRN: [id number] Date of Birth: 05-Feb-1951 Referring Provider (PT): Howard Pouch   Encounter Date: 11/08/2019  PT End of Session - 11/08/19 1614    Visit Number  28    Date for PT Re-Evaluation  11/24/19    PT Start Time  1528    PT Stop Time  1614    PT Time Calculation (min)  46 min    Activity Tolerance  Patient tolerated treatment well    Behavior During Therapy  [facility] for tasks assessed/performed       Past Medical History:  Diagnosis Date  . Anxiety   . Arthritis    knees  . Breast cancer ([place]) 1999  . CHF (congestive heart failure) ([place])    Related to severe mitral regurgitation, April, 2013  . 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 Assess by pulmonary team in the hospital April, 2013  . Ejection fraction    EF 60%, echo, April, 2013, with severe MR before mitral valve replacement  . Herpes   . Hypothyroidism   . IBS (irritable bowel syndrome)   . Mitral valve regurgitation    Mitral valve replacement April, 2013, Mitral valve prolapse  . Multiple sclerosis ([place])   . Neurogenic bladder   . Osteoporosis   . Ovarian cyst   . Paroxysmal atrial fibrillation (HCC)    Rapid atrial fibrillation in-hospital, Rapid cardioversion,  before mitral valve surgery  . Pulmonary hypertension ([place])    Echo, April, 2013, before mitral valve surgery  . S/P Maze operation for atrial fibrillation 01/26/2012   Complete biatrial lesion set using cryothermy via right mini thoracotomy  . S/P mitral valve replacement 01/26/2012   63m Sorin [employer] Optiform mechanical prosthesis via right mini thoracotomy  . Warfarin anticoagulation    Mechanical mitral prosthesis, April, 20136    Past Surgical History:  Procedure Laterality  Date  . BREAST LUMPECTOMY Right 1999   with sent.node, and axillary dissection (20)  . CHEST TUBE INSERTION  01/26/2012   Procedure: CHEST TUBE INSERTION;  Surgeon: CRexene Alberts MD;  [facility] [facility]  Service: Open Heart Surgery;  Laterality: Left;  . COLONOSCOPY  2010   "normal"  . CYSTOSCOPY  1992  . LAPAROSCOPIC OVARIAN CYSTECTOMY  1978   urethral stricture repair  . LEFT AND RIGHT HEART CATHETERIZATION WITH CORONARY ANGIOGRAM N/A 01/20/2012   Procedure: LEFT AND RIGHT HEART CATHETERIZATION WITH CORONARY ANGIOGRAM;  Surgeon: CBurnell Blanks MD;  [facility];  Service: Cardiovascular;  Laterality: N/A;  . LYMPHADENECTOMY    . MAZE  01/26/2012   Procedure: MAZE;  Surgeon: CRexene Alberts MD;  [facility]  Service: Open Heart Surgery;  Laterality: N/A;  . MITRAL VALVE REPLACEMENT  01/26/2012   Procedure: MINIMALLY INVASIVE MITRAL VALVE (MV) REPLACEMENT;  Surgeon: CRexene Alberts MD;  [place] [facility]  Service: Open Heart Surgery;  Laterality: Right;  . TEE WITHOUT CARDIOVERSION  01/19/2012   Procedure: TRANSESOPHAGEAL ECHOCARDIOGRAM (TEE);  Surgeon: Peter M JMartinique MD;  [facility];  Service: Cardiovascular;  Laterality: N/A;  . TONSILLECTOMY  1970  . [place] . WRIST SURGERY Right 2012    There were no vitals filed for this visit.  Subjective Assessment - 11/08/19 1534  Subjective  Patient reports that her knees have been very sore, reports that she thinks that it could have been that she did an extra set and that she went faster on the NuStep    Currently in Pain?  Yes    Pain Score  3     Pain Location  Knee    Pain Orientation  Left    Pain Descriptors / Indicators  Aching    Aggravating Factors   over doing exercises last time                       [facility] Adult PT Treatment/Exercise - 11/08/19 0001      Ambulation/Gait   Gait Comments  gait with rollator cues for longer steps, posture and to stop  shuffling      Lumbar Exercises: Seated   Other Seated Lumbar Exercises  green tband rows and extensions    Other Seated Lumbar Exercises  passive thoracic extension and chest opening, then weighted ball over head reach and look, then left hip to right overhead shoulder and vice versa      Knee/Hip Exercises: Aerobic   Nustep  Level 4 x 6 minutes      Knee/Hip Exercises: Machines for Strengthening   Other Machine  seated row 10# 2x10. lats 15# 2x10, had the most difficulty getting off the machine, a lot of help      Knee/Hip Exercises: Standing   Other Standing Knee Exercises  Alt 14in box taps with two quad canes    Other Standing Knee Exercises  stretching from the pull up bar               PT Short Term Goals - 08/23/19 1541      PT SHORT TERM GOAL #2   Title  Patient to decrease 5x sit to stand time to 15 seconds or less to decrease fall risk.    Status  On-going        PT Long Term Goals - 11/08/19 1619      PT LONG TERM GOAL #2   Title  Pt will amb. 500' over even/uneven terrain with LRAD at MOD I level to improve functional mobility.    Status  On-going      PT LONG TERM GOAL #3   Title  Pt will amb. 150' with SPC at MOD I level (indoors) in order to safely amb. at home.     Status  Partially Met            Plan - 11/08/19 1614    Clinical Impression Statement  Patient very sore in the knees, decreased activity here but added more core work, her biggest issue was getting on and off the row/lat machine.  She liked the core work but still reported her knees were sore    PT Next Visit Plan  continue to work on core and walking for increased safety and function    Consulted and Agree with Plan of Care  Patient       Patient will benefit from skilled therapeutic intervention in order to improve the following deficits and impairments:  Decreased mobility, Postural dysfunction, Decreased strength, Impaired flexibility, Difficulty walking, Decreased  balance  Visit Diagnosis: Muscle weakness (generalized)  Other abnormalities of gait and mobility  Unsteadiness on feet     Problem List Patient Active Problem List   Diagnosis Date Noted  . Osteoporosis 07/05/2019  . Nonischemic cardiomyopathy ([place]) 07/20/2018  . Chest  pain 07/20/2018  . Scoliosis 11/20/2016  . Long term current use of anticoagulant 09/18/2016  . Compression fracture of lumbar spine, non-traumatic, sequela 08/11/2016  . Multiple falls 08/11/2016  . At high risk for injury related to fall 08/11/2016  . Encounter for therapeutic drug monitoring 11/27/2013  . Hypothyroidism   . CHF (congestive heart failure) ([place])   . Paroxysmal atrial fibrillation (HCC)   . Arthritis   . Neurogenic bladder   . Warfarin anticoagulation   . First degree heart block 01/29/2012  . 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 S/P Maze operation for atrial fibrillation 01/26/2012  . Anxiety 01/18/2012  . Multiple sclerosis ([place]) 01/19/2007    Sumner Boast., PT 11/08/2019, 4:20 PM  [place] [address] [place], [place], [address] Phone: [phone]   Fax:  [phone]  Name: [name] [name] MRN: [id number] Date of Birth: [birth year]

## 2019-11-09 NOTE — Telephone Encounter (Signed)
Has this been completed?  Sending to clinical staff for review: Okay to sign/close encounter or is further follow up needed? 

## 2019-11-09 NOTE — Telephone Encounter (Signed)
yes

## 2019-11-13 ENCOUNTER — Ambulatory Visit (INDEPENDENT_AMBULATORY_CARE_PROVIDER_SITE_OTHER): Payer: Medicare Other | Admitting: *Deleted

## 2019-11-13 ENCOUNTER — Other Ambulatory Visit: Payer: Self-pay

## 2019-11-13 DIAGNOSIS — Z5181 Encounter for therapeutic drug level monitoring: Secondary | ICD-10-CM

## 2019-11-13 DIAGNOSIS — Z952 Presence of prosthetic heart valve: Secondary | ICD-10-CM

## 2019-11-13 DIAGNOSIS — Z9889 Other specified postprocedural states: Secondary | ICD-10-CM | POA: Diagnosis not present

## 2019-11-13 DIAGNOSIS — Z8679 Personal history of other diseases of the circulatory system: Secondary | ICD-10-CM | POA: Diagnosis not present

## 2019-11-13 LAB — POCT INR: INR: 2.7 (ref 2.0–3.0)

## 2019-11-13 NOTE — Patient Instructions (Signed)
Description   Continue taking 1 tablet everyday. Recheck in 6 weeks. Call with any new medications or procedures [phone]

## 2019-11-14 ENCOUNTER — Ambulatory Visit: Payer: Medicare Other | Admitting: Physical Therapy

## 2019-11-16 ENCOUNTER — Ambulatory Visit: Payer: Medicare Other | Admitting: Physical Therapy

## 2019-11-17 ENCOUNTER — Telehealth: Payer: Self-pay

## 2019-11-17 ENCOUNTER — Encounter: Payer: Self-pay | Admitting: Family Medicine

## 2019-11-17 NOTE — Telephone Encounter (Addendum)
Signed/reviewed and placed on staff's desk. -Cologuard results are negative.        A negative result indicates a low likelihood that a colorectal cancer (99.9%) or advanced adenoma (~95%) is present.  Routine screening by Cologuard method every 3 years is recommended.

## 2019-11-17 NOTE — Telephone Encounter (Signed)
Patient request test results from cologuard test  Please call patient [phone]

## 2019-11-17 NOTE — Telephone Encounter (Signed)
Printed results, abstracted and placed on Dr Dierdre Highman desk to review.

## 2019-11-17 NOTE — Telephone Encounter (Signed)
Pt was called and given results, she verbalized understanding

## 2019-11-28 ENCOUNTER — Other Ambulatory Visit: Payer: Self-pay

## 2019-11-28 ENCOUNTER — Encounter: Payer: Self-pay | Admitting: Physical Therapy

## 2019-11-28 ENCOUNTER — Ambulatory Visit: Payer: Medicare Other | Attending: Family Medicine | Admitting: Physical Therapy

## 2019-11-28 DIAGNOSIS — M6281 Muscle weakness (generalized): Secondary | ICD-10-CM | POA: Diagnosis not present

## 2019-11-28 DIAGNOSIS — R2681 Unsteadiness on feet: Secondary | ICD-10-CM

## 2019-11-28 DIAGNOSIS — R2689 Other abnormalities of gait and mobility: Secondary | ICD-10-CM

## 2019-11-28 NOTE — Therapy (Signed)
[place], [place], [address] Phone: [phone]   Fax:  [phone]  Physical Therapy Treatment  Patient Details  Name: [name] [name] MRN: [id number] Date of Birth: April 15, 1951 Referring Provider (PT): Howard Pouch   Encounter Date: 11/28/2019  PT End of Session - 11/28/19 1424    Visit Number  29    Date for PT Re-Evaluation  11/24/19    PT Start Time  1345    PT Stop Time  1425    PT Time Calculation (min)  40 min    Activity Tolerance  Patient tolerated treatment well       Past Medical History:  Diagnosis Date  . Anxiety   . Arthritis    knees  . Breast cancer ([place]) 1999  . CHF (congestive heart failure) ([place])    Related to severe mitral regurgitation, April, 2013  . 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 Assess by pulmonary team in the hospital April, 2013  . Ejection fraction    EF 60%, echo, April, 2013, with severe MR before mitral valve replacement  . Herpes   . Hypothyroidism   . IBS (irritable bowel syndrome)   . Mitral valve regurgitation    Mitral valve replacement April, 2013, Mitral valve prolapse  . Multiple sclerosis ([place])   . Neurogenic bladder   . Osteoporosis   . Ovarian cyst   . Paroxysmal atrial fibrillation (HCC)    Rapid atrial fibrillation in-hospital, Rapid cardioversion,  before mitral valve surgery  . Pulmonary hypertension ([place])    Echo, April, 2013, before mitral valve surgery  . S/P Maze operation for atrial fibrillation 01/26/2012   Complete biatrial lesion set using cryothermy via right mini thoracotomy  . S/P mitral valve replacement 01/26/2012   52m Sorin [employer] Optiform mechanical prosthesis via right mini thoracotomy  . Warfarin anticoagulation    Mechanical mitral prosthesis, April, 20136    Past Surgical History:  Procedure Laterality Date  . BREAST LUMPECTOMY Right 1999   with sent.node, and  axillary dissection (20)  . CHEST TUBE INSERTION  01/26/2012   Procedure: CHEST TUBE INSERTION;  Surgeon: CRexene Alberts MD;  [place] [facility] [place]  Service: Open Heart Surgery;  Laterality: Left;  . COLONOSCOPY  2010   "normal"  . CYSTOSCOPY  1992  . LAPAROSCOPIC OVARIAN CYSTECTOMY  1978   urethral stricture repair  . LEFT AND RIGHT HEART CATHETERIZATION WITH CORONARY ANGIOGRAM N/A 01/20/2012   Procedure: LEFT AND RIGHT HEART CATHETERIZATION WITH CORONARY ANGIOGRAM;  Surgeon: CBurnell Blanks MD;  [facility];  Service: Cardiovascular;  Laterality: N/A;  . LYMPHADENECTOMY    . MAZE  01/26/2012   Procedure: MAZE;  Surgeon: CRexene Alberts MD;  [facility]  Service: Open Heart Surgery;  Laterality: N/A;  . MITRAL VALVE REPLACEMENT  01/26/2012   Procedure: MINIMALLY INVASIVE MITRAL VALVE (MV) REPLACEMENT;  Surgeon: CRexene Alberts MD;  [facility]  Service: Open Heart Surgery;  Laterality: Right;  . TEE WITHOUT CARDIOVERSION  01/19/2012   Procedure: TRANSESOPHAGEAL ECHOCARDIOGRAM (TEE);  Surgeon: Peter M JMartinique MD;  [facility];  Service: Cardiovascular;  Laterality: N/A;  . TONSILLECTOMY  1970  . [place] . WRIST SURGERY Right 2012    There were no vitals filed for this visit.  Subjective Assessment - 11/28/19 1347    Subjective  "Pretty good, except my L knee  is making noise"    Currently in Pain?  Yes    Pain Location  Knee    Pain Orientation  Left         [facility] PT Assessment - 11/28/19 0001      Berg Balance Test   Sit to Stand  Able to stand  independently using hands    Standing Unsupported  Able to stand safely 2 minutes    Sitting with Back Unsupported but Feet Supported on Floor or Stool  Able to sit safely and securely 2 minutes    Stand to Sit  Sits safely with minimal use of hands    Transfers  Able to transfer safely, definite need of hands    Standing Unsupported with Eyes Closed  Able to stand 10 seconds safely     Standing Unsupported with Feet Together  Needs help to attain position but able to stand for 30 seconds with feet together    From Standing, Reach Forward with Outstretched Arm  Can reach confidently >25 cm (10")    From Standing Position, Pick up Object from Floor  Able to pick up shoe safely and easily    From Standing Position, Turn to Look Behind Over each Shoulder  Turn sideways only but maintains balance    Turn 360 Degrees  Able to turn 360 degrees safely but slowly    Standing Unsupported, Alternately Place Feet on Step/Stool  Able to complete 4 steps without aid or supervision    Standing Unsupported, One Foot in Front  Able to take small step independently and hold 30 seconds    Standing on One Leg  Unable to try or needs assist to prevent fall    Total Score  39      Timed Up and Go Test   Normal TUG (seconds)  38    TUG Comments  with a SPC, then perfromed with the rollator and she did in 22 seconds                   [facility] Adult PT Treatment/Exercise - 11/28/19 0001      Lumbar Exercises: Standing   Other Standing Lumbar Exercises  Rows & Ext green Tband 2x10     Other Standing Lumbar Exercises  OHP yellow ball 2x10       Knee/Hip Exercises: Aerobic   Nustep  Level 4 x 6 minutes      Knee/Hip Exercises: Machines for Strengthening   Cybex Knee Flexion  15lb 2x10                PT Short Term Goals - 08/23/19 1541      PT SHORT TERM GOAL #2   Title  Patient to decrease 5x sit to stand time to 15 seconds or less to decrease fall risk.    Status  On-going        PT Long Term Goals - 11/08/19 1619      PT LONG TERM GOAL #2   Title  Pt will amb. 500' over even/uneven terrain with LRAD at MOD I level to improve functional mobility.    Status  On-going      PT LONG TERM GOAL #3   Title  Pt will amb. 150' with SPC at MOD I level (indoors) in order to safely amb. at home.     Status  Partially Met            Plan - 11/28/19 1428     Clinical   Impression Statement  Pt has progressed increasing her BERG score as well as decreasing her TUG time. Some knee soreness reported so lesser weight was selected with leg curls. Cues needed for core engagement during OHP. Some core weakness noted with Ext.    Personal Factors and Comorbidities  Age;Fitness;Comorbidity 3+    Comorbidities  COPD, HTN, Osteoporosis, mitral valve replacement 2018, breast CA, arthritis bil knees    Examination-Activity Limitations  Locomotion Level;Transfers;Stairs;Stand    Examination-Participation Restrictions  Community Activity    Stability/Clinical Decision Making  Evolving/Moderate complexity    Rehab Potential  Good    PT Frequency  2x / week    PT Duration  8 weeks    PT Treatment/Interventions  ADLs/Self Care Home Management;Gait training;Stair training;Therapeutic activities;Therapeutic exercise;Balance training;Neuromuscular re-education;Patient/family education;Manual techniques    PT Next Visit Plan  continue to work on core and walking for increased safety and function       Patient will benefit from skilled therapeutic intervention in order to improve the following deficits and impairments:  Decreased mobility, Postural dysfunction, Decreased strength, Impaired flexibility, Difficulty walking, Decreased balance  Visit Diagnosis: Muscle weakness (generalized)  Other abnormalities of gait and mobility  Unsteadiness on feet     Problem List Patient Active Problem List   Diagnosis Date Noted  . Osteoporosis 07/05/2019  . Nonischemic cardiomyopathy ([place]) 07/20/2018  . Chest pain 07/20/2018  . Scoliosis 11/20/2016  . Long term current use of anticoagulant 09/18/2016  . Compression fracture of lumbar spine, non-traumatic, sequela 08/11/2016  . Multiple falls 08/11/2016  . At high risk for injury related to fall 08/11/2016  . Encounter for therapeutic drug monitoring 11/27/2013  . Hypothyroidism   . CHF (congestive heart failure) ([place])    . Paroxysmal atrial fibrillation (HCC)   . Arthritis   . Neurogenic bladder   . Warfarin anticoagulation   . First degree heart block 01/29/2012  . 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 S/P Maze operation for atrial fibrillation 01/26/2012  . Anxiety 01/18/2012  . Multiple sclerosis ([place]) 01/19/2007    Scot Jun, PTA 11/28/2019, 2:31 PM  [place] [address] [place], [place], [address] Phone: [phone]   Fax:  [phone]  Name: [name] [name] MRN: [id number] Date of Birth: 1951/01/21

## 2019-11-30 ENCOUNTER — Encounter: Payer: Medicare Other | Admitting: Physical Therapy

## 2019-12-05 ENCOUNTER — Encounter: Payer: Self-pay | Admitting: Physical Therapy

## 2019-12-05 ENCOUNTER — Other Ambulatory Visit: Payer: Self-pay

## 2019-12-05 ENCOUNTER — Ambulatory Visit: Payer: Medicare Other | Admitting: Physical Therapy

## 2019-12-05 DIAGNOSIS — M6281 Muscle weakness (generalized): Secondary | ICD-10-CM | POA: Diagnosis not present

## 2019-12-05 DIAGNOSIS — R2681 Unsteadiness on feet: Secondary | ICD-10-CM

## 2019-12-05 DIAGNOSIS — R2689 Other abnormalities of gait and mobility: Secondary | ICD-10-CM

## 2019-12-05 NOTE — Therapy (Signed)
[place], [place], [address] Phone: [phone]   Fax:  [phone] Progress Note Reporting Period 10/10/19 to 12/04/18 for visits 21-30  See note below for Objective Data and Assessment of Progress/Goals.      Physical Therapy Treatment  Patient Details  Name: [name] [name] MRN: [id number] Date of Birth: [birth year] Referring Provider (PT): Howard Pouch   Encounter Date: 12/05/2019  PT End of Session - 12/05/19 1558    Visit Number  30    PT Start Time  3016    PT Stop Time  1600    PT Time Calculation (min)  45 min    Activity Tolerance  Patient tolerated treatment well    Behavior During Therapy  [facility] for tasks assessed/performed       Past Medical History:  Diagnosis Date  . Anxiety   . Arthritis    knees  . Breast cancer ([place]) 1999  . CHF (congestive heart failure) ([place])    Related to severe mitral regurgitation, April, 2013  . 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 Assess by pulmonary team in the hospital April, 2013  . Ejection fraction    EF 60%, echo, April, 2013, with severe MR before mitral valve replacement  . Herpes   . Hypothyroidism   . IBS (irritable bowel syndrome)   . Mitral valve regurgitation    Mitral valve replacement April, 2013, Mitral valve prolapse  . Multiple sclerosis ([place])   . Neurogenic bladder   . Osteoporosis   . Ovarian cyst   . Paroxysmal atrial fibrillation (HCC)    Rapid atrial fibrillation in-hospital, Rapid cardioversion,  before mitral valve surgery  . Pulmonary hypertension ([place])    Echo, April, 2013, before mitral valve surgery  . S/P Maze operation for atrial fibrillation 01/26/2012   Complete biatrial lesion set using cryothermy via right mini thoracotomy  . S/P mitral valve replacement 01/26/2012   44m Sorin [employer] Optiform mechanical prosthesis via right mini thoracotomy  . Warfarin  anticoagulation    Mechanical mitral prosthesis, April, 20136    Past Surgical History:  Procedure Laterality Date  . BREAST LUMPECTOMY Right 1999   with sent.node, and axillary dissection (20)  . CHEST TUBE INSERTION  01/26/2012   Procedure: CHEST TUBE INSERTION;  Surgeon: CRexene Alberts MD;  [facility]  Service: Open Heart Surgery;  Laterality: Left;  . COLONOSCOPY  2010   "normal"  . CYSTOSCOPY  1992  . LAPAROSCOPIC OVARIAN CYSTECTOMY  1978   urethral stricture repair  . LEFT AND RIGHT HEART CATHETERIZATION WITH CORONARY ANGIOGRAM N/A 01/20/2012   Procedure: LEFT AND RIGHT HEART CATHETERIZATION WITH CORONARY ANGIOGRAM;  Surgeon: CBurnell Blanks MD;  [facility];  Service: Cardiovascular;  Laterality: N/A;  . LYMPHADENECTOMY    . MAZE  01/26/2012   Procedure: MAZE;  Surgeon: CRexene Alberts MD;  [facility]  Service: Open Heart Surgery;  Laterality: N/A;  . MITRAL VALVE REPLACEMENT  01/26/2012   Procedure: MINIMALLY INVASIVE MITRAL VALVE (MV) REPLACEMENT;  Surgeon: CRexene Alberts MD;  [facility]  Service: Open Heart Surgery;  Laterality: Right;  . TEE WITHOUT CARDIOVERSION  01/19/2012   Procedure: TRANSESOPHAGEAL ECHOCARDIOGRAM (TEE);  Surgeon: Peter M JMartinique MD;  [facility];  Service: Cardiovascular;  Laterality: N/A;  . TONSILLECTOMY  1970  . [place] WRIST SURGERY Right 2012  There were no vitals filed for this visit.  Subjective Assessment - 12/05/19 1523    Subjective  Pt reports some L knee pain into her L groin due to the weather she thinks    Currently in Pain?  Yes    Pain Score  3     Pain Location  Knee    Pain Orientation  Left                       [facility] Adult PT Treatment/Exercise - 12/05/19 0001      High Level Balance   High Level Balance Activities  Side stepping;Marching backwards;Marching forwards    High Level Balance Comments  HHA x 2      Lumbar Exercises: Standing    Other Standing Lumbar Exercises  Ext green Tband 2x15       Lumbar Exercises: Seated   Other Seated Lumbar Exercises  Blue Tband rows 2x15      Knee/Hip Exercises: Aerobic   Nustep  Level 4 x 6 minutes   500 steps      Knee/Hip Exercises: Machines for Strengthening   Cybex Knee Extension  5# 3x10    Cybex Knee Flexion  25lb 2x10       Knee/Hip Exercises: Standing   Other Standing Knee Exercises  Alt 6in box taps with two quad canes               PT Short Term Goals - 08/23/19 1541      PT SHORT TERM GOAL #2   Title  Patient to decrease 5x sit to stand time to 15 seconds or less to decrease fall risk.    Status  On-going        PT Long Term Goals - 11/08/19 1619      PT LONG TERM GOAL #2   Title  Pt will amb. 500' over even/uneven terrain with LRAD at MOD I level to improve functional mobility.    Status  On-going      PT LONG TERM GOAL #3   Title  Pt will amb. 150' with SPC at MOD I level (indoors) in order to safely amb. at home.     Status  Partially Met            Plan - 12/05/19 1559    Clinical Impression Statement  Pt did well today. ambulated throughout clinic today without AD. All balance interventions performed with hand held assist x1 or 2. increase fatigue noted today. Cues to complete full ROM with seated leg extensions. Some core weakness noted with shoulder extension with Tband.    Personal Factors and Comorbidities  Age;Fitness;Comorbidity 3+    Comorbidities  COPD, HTN, Osteoporosis, mitral valve replacement 2018, breast CA, arthritis bil knees    Examination-Activity Limitations  Locomotion Level;Transfers;Stairs;Stand    Examination-Participation Restrictions  Community Activity    Stability/Clinical Decision Making  Evolving/Moderate complexity    Rehab Potential  Good    PT Frequency  2x / week    PT Duration  8 weeks    PT Treatment/Interventions  ADLs/Self Care Home Management;Gait training;Stair training;Therapeutic  activities;Therapeutic exercise;Balance training;Neuromuscular re-education;Patient/family education;Manual techniques    PT Next Visit Plan  continue to work on core and walking for increased safety and function       Patient will benefit from skilled therapeutic intervention in order to improve the following deficits and impairments:  Decreased mobility, Postural dysfunction, Decreased strength, Impaired flexibility, Difficulty walking, Decreased balance  Visit Diagnosis: Other abnormalities of gait and mobility  Unsteadiness on feet  Muscle weakness (generalized)     Problem List Patient Active Problem List   Diagnosis Date Noted  . Osteoporosis 07/05/2019  . Nonischemic cardiomyopathy ([place]) 07/20/2018  . Chest pain 07/20/2018  . Scoliosis 11/20/2016  . Long term current use of anticoagulant 09/18/2016  . Compression fracture of lumbar spine, non-traumatic, sequela 08/11/2016  . Multiple falls 08/11/2016  . At high risk for injury related to fall 08/11/2016  . Encounter for therapeutic drug monitoring 11/27/2013  . Hypothyroidism   . CHF (congestive heart failure) ([place])   . Paroxysmal atrial fibrillation (HCC)   . Arthritis   . Neurogenic bladder   . Warfarin anticoagulation   . First degree heart block 01/29/2012  . 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 S/P Maze operation for atrial fibrillation 01/26/2012  . Anxiety 01/18/2012  . Multiple sclerosis ([place]) 01/19/2007    Scot Jun, PTA 12/05/2019, 4:02 PM  [place] [address] [place], [place], [address] Phone: [phone]   Fax:  [phone]  Name: [name] [name] MRN: [id number] Date of Birth: [birth year]

## 2019-12-08 ENCOUNTER — Other Ambulatory Visit: Payer: Medicare Other

## 2019-12-12 ENCOUNTER — Ambulatory Visit: Payer: Medicare Other | Admitting: Physical Therapy

## 2019-12-13 ENCOUNTER — Other Ambulatory Visit: Payer: Medicare Other

## 2019-12-14 ENCOUNTER — Ambulatory Visit
Admission: RE | Admit: 2019-12-14 | Discharge: 2019-12-14 | Disposition: A | Discharge status: Home / Self Care | Payer: Medicare Other | Source: Ambulatory Visit | Attending: Neurology | Admitting: Neurology

## 2019-12-14 ENCOUNTER — Other Ambulatory Visit: Payer: Self-pay

## 2019-12-14 ENCOUNTER — Encounter: Payer: Self-pay | Admitting: Physical Therapy

## 2019-12-14 ENCOUNTER — Ambulatory Visit: Payer: Medicare Other | Admitting: Physical Therapy

## 2019-12-14 DIAGNOSIS — M6281 Muscle weakness (generalized): Secondary | ICD-10-CM | POA: Diagnosis not present

## 2019-12-14 DIAGNOSIS — G35 Multiple sclerosis: Secondary | ICD-10-CM

## 2019-12-14 DIAGNOSIS — R2681 Unsteadiness on feet: Secondary | ICD-10-CM

## 2019-12-14 DIAGNOSIS — R2689 Other abnormalities of gait and mobility: Secondary | ICD-10-CM

## 2019-12-14 NOTE — Therapy (Signed)
[place], [place], [address] Phone: [phone]   Fax:  [phone]  Physical Therapy Treatment  Patient Details  Name: [name] [name] MRN: [id number] Date of Birth: 1951-02-09 Referring Provider (PT): Howard Pouch   Encounter Date: 12/14/2019  PT End of Session - 12/14/19 1446    Visit Number  31    Date for PT Re-Evaluation  12/24/19    PT Start Time  1400    PT Stop Time  1443    PT Time Calculation (min)  43 min    Activity Tolerance  Patient tolerated treatment well    Behavior During Therapy  [facility] for tasks assessed/performed       Past Medical History:  Diagnosis Date  . Anxiety   . Arthritis    knees  . Breast cancer ([place]) 1999  . CHF (congestive heart failure) ([place])    Related to severe mitral regurgitation, April, 2013  . 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 Assess by pulmonary team in the hospital April, 2013  . Ejection fraction    EF 60%, echo, April, 2013, with severe MR before mitral valve replacement  . Herpes   . Hypothyroidism   . IBS (irritable bowel syndrome)   . Mitral valve regurgitation    Mitral valve replacement April, 2013, Mitral valve prolapse  . Multiple sclerosis ([place])   . Neurogenic bladder   . Osteoporosis   . Ovarian cyst   . Paroxysmal atrial fibrillation (HCC)    Rapid atrial fibrillation in-hospital, Rapid cardioversion,  before mitral valve surgery  . Pulmonary hypertension ([place])    Echo, April, 2013, before mitral valve surgery  . S/P Maze operation for atrial fibrillation 01/26/2012   Complete biatrial lesion set using cryothermy via right mini thoracotomy  . S/P mitral valve replacement 01/26/2012   86m Sorin [employer] Optiform mechanical prosthesis via right mini thoracotomy  . Warfarin anticoagulation    Mechanical mitral prosthesis, April, 20136    Past Surgical History:  Procedure Laterality  Date  . BREAST LUMPECTOMY Right 1999   with sent.node, and axillary dissection (20)  . CHEST TUBE INSERTION  01/26/2012   Procedure: CHEST TUBE INSERTION;  Surgeon: CRexene Alberts MD;  [facility]  Service: Open Heart Surgery;  Laterality: Left;  . COLONOSCOPY  2010   "normal"  . CYSTOSCOPY  1992  . LAPAROSCOPIC OVARIAN CYSTECTOMY  1978   urethral stricture repair  . LEFT AND RIGHT HEART CATHETERIZATION WITH CORONARY ANGIOGRAM N/A 01/20/2012   Procedure: LEFT AND RIGHT HEART CATHETERIZATION WITH CORONARY ANGIOGRAM;  Surgeon: CBurnell Blanks MD;  [facility];  Service: Cardiovascular;  Laterality: N/A;  . LYMPHADENECTOMY    . MAZE  01/26/2012   Procedure: MAZE;  Surgeon: CRexene Alberts MD;  [facility]  Service: Open Heart Surgery;  Laterality: N/A;  . MITRAL VALVE REPLACEMENT  01/26/2012   Procedure: MINIMALLY INVASIVE MITRAL VALVE (MV) REPLACEMENT;  Surgeon: CRexene Alberts MD;  [facility]  Service: Open Heart Surgery;  Laterality: Right;  . TEE WITHOUT CARDIOVERSION  01/19/2012   Procedure: TRANSESOPHAGEAL ECHOCARDIOGRAM (TEE);  Surgeon: Peter M JMartinique MD;  [facility];  Service: Cardiovascular;  Laterality: N/A;  . TONSILLECTOMY  1970  . [place] . WRIST SURGERY Right 2012    There were no vitals filed for this visit.  Subjective Assessment - 12/14/19 1407  Subjective  I had diahrea and could not come in and I feel wiped out    Currently in Pain?  No/denies                       [facility] Adult PT Treatment/Exercise - 12/14/19 0001      Ambulation/Gait   Gait Comments  gait with HHA with a lot of cues for step length, big steps, and to not shfuffle wihen turning      Lumbar Exercises: Stretches   Other Lumbar Stretch Exercise  cerv stretch to right 2x 30 sec       Lumbar Exercises: Aerobic   Nustep  lvl 4 x 7 min      Lumbar Exercises: Seated   Other Seated Lumbar Exercises  Blue Tband rows  2x15, red tband arm extension, bosu behind partial sit ups    Other Seated Lumbar Exercises  passive thoracic extension and chest opening, then weighted ball over head reach and look, then left hip to right overhead shoulder and vice versa elbow to mat on right and left      Knee/Hip Exercises: Machines for Strengthening   Cybex Knee Extension  5# 3x10    Cybex Knee Flexion  25lb 2x10       Knee/Hip Exercises: Standing   Hip Flexion  Both;2 sets;10 reps    Hip Flexion Limitations  3#               PT Short Term Goals - 08/23/19 1541      PT SHORT TERM GOAL #2   Title  Patient to decrease 5x sit to stand time to 15 seconds or less to decrease fall risk.    Status  On-going        PT Long Term Goals - 12/14/19 1450      PT LONG TERM GOAL #1   Title  Independent with HEP for strengthening and flexibility to increase function.    Status  Achieved      PT LONG TERM GOAL #2   Title  Pt will amb. 500' over even/uneven terrain with LRAD at MOD I level to improve functional mobility.    Status  On-going      PT LONG TERM GOAL #3   Title  Pt will amb. 150' with SPC at MOD I level (indoors) in order to safely amb. at home.     Status  Partially Met      PT LONG TERM GOAL #4   Title  Patient able to climb stairs with a reciprocal gait pattern and BUE support    Status  On-going      PT LONG TERM GOAL #5   Title  Patient will demostrate reduced risk of falling reflected by incr Berg Balance Assessment to 39/56    Status  Partially Met            Plan - 12/14/19 1447    Clinical Impression Statement  Patient has had some ups and downs wiht being able to attend this period, she had to cancel numberous times due to weather and then for illness.  She seems to regress with this and goes back to the small shuffling steps and poor transfers, she did well with changing posture with cues.  She does go slow at times and needs some cues to stay on task.    PT Frequency  2x / week     PT Duration  4 weeks  PT Treatment/Interventions  ADLs/Self Care Home Management;Gait training;Stair training;Therapeutic activities;Therapeutic exercise;Balance training;Neuromuscular re-education;Patient/family education;Manual techniques    PT Next Visit Plan  continue to work on core and walking for increased safety and function    Consulted and Agree with Plan of Care  Patient       Patient will benefit from skilled therapeutic intervention in order to improve the following deficits and impairments:  Decreased mobility, Postural dysfunction, Decreased strength, Impaired flexibility, Difficulty walking, Decreased balance  Visit Diagnosis: Other abnormalities of gait and mobility - Plan: PT plan of care cert/re-cert  Unsteadiness on feet - Plan: PT plan of care cert/re-cert  Muscle weakness (generalized) - Plan: PT plan of care cert/re-cert     Problem List Patient Active Problem List   Diagnosis Date Noted  . Osteoporosis 07/05/2019  . Nonischemic cardiomyopathy ([place]) 07/20/2018  . Chest pain 07/20/2018  . Scoliosis 11/20/2016  . Long term current use of anticoagulant 09/18/2016  . Compression fracture of lumbar spine, non-traumatic, sequela 08/11/2016  . Multiple falls 08/11/2016  . At high risk for injury related to fall 08/11/2016  . Encounter for therapeutic drug monitoring 11/27/2013  . Hypothyroidism   . CHF (congestive heart failure) ([place])   . Paroxysmal atrial fibrillation (HCC)   . Arthritis   . Neurogenic bladder   . Warfarin anticoagulation   . First degree heart block 01/29/2012  . 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 S/P Maze operation for atrial fibrillation 01/26/2012  . Anxiety 01/18/2012  . Multiple sclerosis ([place]) 01/19/2007    Sumner Boast., PT 12/14/2019, 3:20 PM  [place] [address] [place], [place], [address] Phone: [phone]   Fax:  [phone]  Name:  [name] [name] MRN: [id number] Date of Birth: 06-25-1951

## 2019-12-18 ENCOUNTER — Encounter: Payer: Self-pay | Admitting: *Deleted

## 2019-12-18 NOTE — Progress Notes (Signed)
Due to the COVID-19 crisis, this virtual check-in visit was done via telephone from my office and it was initiated and consent given by this patient and or family.   Telephone (Audio) Visit The purpose of this telephone visit is to provide medical care while limiting exposure to the novel coronavirus.    Consent was obtained for telephone visit and initiated by pt/family:  Yes.   Answered questions that patient had about telehealth interaction:  Yes.   I discussed the limitations, risks, security and privacy concerns of performing an evaluation and management service by telephone. I also discussed with the patient that there may be a patient responsible charge related to this service. The patient expressed understanding and agreed to proceed.  Pt location: Home Physician Location: office Name of referring provider:  Howard Pouch A, DO I connected with .[name] [name] at patients initiation/request on 12/19/2019 at  2:30 PM EST by telephone and verified that I am speaking with the correct person using two identifiers.  Pt MRN:  [id number] Pt DOB:  10/03/1951  History of Present Illness:  [name] [name] is a 69 year old right-handed Caucasian woman with COPD, CHF, mitral valve replacement, hypothyroidism who follows up for multiple sclerosis.  UPDATE: Current DMT:  None.  She has deferred prior recommendations. She increased D3 to 8000 IU daily. D level from December was 91.  Since then, she decreased dose back to 6000 IU daily.  Also taking biotin 5038mcg daily.  MRI of brain without contrast from 12/15/2019 was stable compared to prior imaging from 02/18/2017.  Overall doing well.  She has been going to physical therapy about twice a week.  Vision:  Cataracts Motor:  No weakness Sensory:  No issues Pain:  stable Gait:  stable Bowel/Bladder:  stable Fatigue: She feels she has a little more stamina.   Cognition:  No issues Mood:  No issues  HISTORY: She was diagnosed with  multiple sclerosis in 1994. At the time, she exhibited tingling in her hands as well as frequent falls. She saw a neurologist who performed an MRI of the brain that revealed white matter lesions. She underwent a lumbar puncture which reportedly demonstrated possible elevated IgG index. It was recommended to start Avonex, but she declined, fearing potential side effects. She has never been on disease modifying therapy.  She has not had any clear MS flares. She has chronic symptoms, such as falls, neurogenic bladder and occasional tingling in the left hand. She used to have muscle spasms in the right arm many years ago, which has resolved. Over the past several months, she has started to use a rolling walker due to increased gait difficulty which she attributes to her MS-related chronic gait instability, as well as arthritis and scoliosis. She currently is undergoing physical therapy and sees a Physiological scientist once a week. She has a urologist.  She has never had optic neuritis or focal/unilateral weakness.   Imaging: MRI of brain without contrast was performed on 07/11/14 revealed supratentorial white matter lesions consistent with demyelinating disease as well as diffuse cortical atrophy.  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 She has sustained L2 and L5 compression fractures with height loss in the past due to her falls.  MRI of brain without contrast from 02/18/17 was personally reviewed and was stable compared to prior imaging from 07/11/14. It revealed multiple lesions in the periventricular and  juxta cortical white matter, as well as within the right brachium pontis. MRI of cervical spine revealed thin appearance of cervical cord without demyelination.   Past Medical History: Past Medical History:  Diagnosis Date  . Anxiety   .  Arthritis    knees  . Breast cancer ([place]) 1999  . CHF (congestive heart failure) ([place])    Related to severe mitral regurgitation, April, 2013  . 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 Assess by pulmonary team in the hospital April, 2013  . Ejection fraction    EF 60%, echo, April, 2013, with severe MR before mitral valve replacement  . Herpes   . Hypothyroidism   . IBS (irritable bowel syndrome)   . Mitral valve regurgitation    Mitral valve replacement April, 2013, Mitral valve prolapse  . Multiple sclerosis ([place])   . Neurogenic bladder   . Osteoporosis   . Ovarian cyst   . Paroxysmal atrial fibrillation (HCC)    Rapid atrial fibrillation in-hospital, Rapid cardioversion,  before mitral valve surgery  . Pulmonary hypertension ([place])    Echo, April, 2013, before mitral valve surgery  . S/P Maze operation for atrial fibrillation 01/26/2012   Complete biatrial lesion set using cryothermy via right mini thoracotomy  . 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 Warfarin anticoagulation    Mechanical mitral prosthesis, April, 20136    Medications: Outpatient Encounter Medications as of 12/19/2019  Medication Sig Note  . amoxicillin (AMOXIL) 500 MG capsule take 4 capsules by mouth 1 hour prior to dental appointment   . Biotin 5000 MCG CAPS Take 20,000 mcg by mouth.    . Black Cohosh 40 MG CAPS Take 40 mg by mouth daily.   . Bromelains (BROMELAIN PO) Take 1 capsule by mouth 2 (two) times daily.   . calcium carbonate (OSCAL) 1500 (600 Ca) MG TABS tablet Takes 1 time daily   . Cholecalciferol (VITAMIN D3) 2000 units capsule Take 6,000 Units by mouth daily.  08/11/2016: Received from: [facility]) Received Sig: 3 with breakfast  . Coenzyme Q10 (CO Q-10) 100 MG CAPS Take 1 capsule by mouth daily.    . Cranberry 500 MG  CAPS Take 1 capsule by mouth daily.   Marland Kitchen L-THEANINE PO Take 1 capsule by mouth daily.    Marland Kitchen lactose free nutrition (BOOST PLUS) LIQD Take 237 mLs by mouth daily.    Marland Kitchen lidocaine-prilocaine (EMLA) cream Apply 1 application topically as needed (per pt this is for use before blood draws).    Marland Kitchen lisinopril (ZESTRIL) 2.5 MG tablet Take 2 tablets (5 mg total) by mouth daily.   Marland Kitchen Lysine 500 MG CAPS Take 1 capsule by mouth daily.   . Magnesium Citrate 200 MG TABS Take 200 mg by mouth daily as needed.   . metoprolol tartrate (LOPRESSOR) 25 MG tablet TAKE 1 TABLET(25 MG) BY MOUTH TWICE DAILY   . MILK THISTLE PO Take 1 capsule by mouth 2 (two) times daily.   . Misc Natural Products (GLUCOSAMINE CHOND COMPLEX/MSM PO) Strength of dose Glucosamine 1500mg  /Chodroitron 1000mg / MSM 500mg  takes one twice daily   . NON FORMULARY 1 capsule 2 (two) times daily.   Jonna Coup Leaf 500 MG CAPS Take 1 capsule by mouth 2 (two) times daily.    Marland Kitchen OVER THE COUNTER MEDICATION Red marine algae - 1 capsules daily   .  Petasin (PETADOLEX PO) Take by mouth 2 (two) times daily.   . Probiotic Product (PROBIOTIC DAILY PO) Take 1 capsule once a day   . PROGESTERONE MICRONIZED PO Take 100 mg by mouth daily.    . Pumpkin Seed (URIPLEX) 500 MG TABS Take 1 tablet by mouth 2 (two) times daily. 08/11/2016: Received from: [facility]) Received Sig: extract 262 mg 1 with breakfast and 1 with lunch  . thyroid (ARMOUR) 90 MG tablet Take 90 mg by mouth daily.   . TURMERIC PO Take by mouth.   . vitamin A 10000 UNIT capsule Take 10,000 Units by mouth daily.   . vitamin E 400 UNIT capsule Take 400 Units by mouth daily.   Marland Kitchen warfarin (COUMADIN) 5 MG tablet TAKE 1 TABLET BY MOUTH AS DIRECTED BY COUMADIN CLINIC    No facility-administered encounter medications on file as of 12/19/2019.    Allergies: Allergies  Allergen Reactions  . Erythromycin Nausea And Vomiting    Family History: Family  History  Problem Relation Age of Onset  . Heart disease Father        cardiac arrest   . CAD Father   . Hypertension Father   . CAD Mother        5 stents and numerous bypass surgery  . Hypertension Mother   . Hyperlipidemia Mother   . CAD Other   . Breast cancer Maternal Grandmother   . Breast cancer Maternal Aunt     Social History: Social History   Socioeconomic History  . Marital status: Widowed    Spouse name: Not on file  . Number of children: 2  . Years of education: 3  . Highest education level: Not on file  Occupational History  . Occupation: Retired  Tobacco Use  . Smoking status: Never Smoker  . Smokeless tobacco: Never Used  Substance and Sexual Activity  . Alcohol use: No  . Drug use: No  . Sexual activity: Never    Birth control/protection: None  Other Topics Concern  . Not on file  Social History Narrative   Patient is a widower (since 30), he currently lives with her son and daughter-in-law. She has 2 children.   She is college educated, and retired from the [employer].   She uses herbal remedies, and multiple over-the-counter supplements. She takes a daily vitamin.   She wears her seatbelt, exercises routinely, smoke detector in the home.   Requires a walker or wheelchair at times.   Feels safe in her relationships.   Social Determinants of Health   Financial Resource Strain:   . Difficulty of Paying Living Expenses: Not on file  Food Insecurity:   . Worried About Charity fundraiser in the Last Year: Not on file  . Ran Out of Food in the Last Year: Not on file  Transportation Needs:   . Lack of Transportation (Medical): Not on file  . Lack of Transportation (Non-Medical): Not on file  Physical Activity:   . Days of Exercise per Week: Not on file  . Minutes of Exercise per Session: Not on file  Stress:   . Feeling of Stress : Not on file  Social Connections:   . Frequency of Communication with Friends and Family: Not on file  .  Frequency of Social Gatherings with Friends and Family: Not on file  . Attends Religious Services: Not on file  . Active Member of Clubs or Organizations: Not on file  .  Attends Archivist Meetings: Not on file  . Marital Status: Not on file  Intimate Partner Violence:   . Fear of Current or Ex-Partner: Not on file  . Emotionally Abused: Not on file  . Physically Abused: Not on file  . Sexually Abused: Not on file    Observations/Objective:   There were no vitals taken for this visit.   Assessment and Plan:   Multiple sclerosis  1.  D3 6000 IU daily 2.  Follow up in one year  Follow Up Instructions:    -I discussed the assessment and treatment plan with the patient. The patient was provided an opportunity to ask questions and all were answered. The patient agreed with the plan and demonstrated an understanding of the instructions.   The patient was advised to call back or seek an in-person evaluation if the symptoms worsen or if the condition fails to improve as anticipated.  Need for immediate in-office visit:  No  Total Time spent in visit with the patient was:  11 minutes   Dudley Major, DO

## 2019-12-19 ENCOUNTER — Telehealth (INDEPENDENT_AMBULATORY_CARE_PROVIDER_SITE_OTHER): Payer: Medicare Other | Admitting: Neurology

## 2019-12-19 ENCOUNTER — Other Ambulatory Visit: Payer: Self-pay

## 2019-12-19 ENCOUNTER — Encounter: Payer: Self-pay | Admitting: Neurology

## 2019-12-19 DIAGNOSIS — G35 Multiple sclerosis: Secondary | ICD-10-CM | POA: Diagnosis not present

## 2019-12-20 ENCOUNTER — Encounter: Payer: Self-pay | Admitting: Physical Therapy

## 2019-12-20 ENCOUNTER — Ambulatory Visit: Payer: Medicare Other | Attending: Family Medicine | Admitting: Physical Therapy

## 2019-12-20 DIAGNOSIS — R2681 Unsteadiness on feet: Secondary | ICD-10-CM | POA: Diagnosis not present

## 2019-12-20 DIAGNOSIS — M6281 Muscle weakness (generalized): Secondary | ICD-10-CM | POA: Insufficient documentation

## 2019-12-20 DIAGNOSIS — R2689 Other abnormalities of gait and mobility: Secondary | ICD-10-CM | POA: Diagnosis not present

## 2019-12-20 NOTE — Therapy (Signed)
[address] [place], [place], [address] Phone: [phone]   Fax:  [phone]  Physical Therapy Treatment  Patient Details  Name: [name] [name] MRN: [id number] Date of Birth: 1951/01/23 Referring Provider (PT): Howard Pouch   Encounter Date: 12/20/2019  PT End of Session - 12/20/19 1616    Visit Number  32    Date for PT Re-Evaluation  12/24/19    PT Start Time  1093    PT Stop Time  1611    PT Time Calculation (min)  40 min    Activity Tolerance  Patient tolerated treatment well    Behavior During Therapy  [facility] for tasks assessed/performed       Past Medical History:  Diagnosis Date  . Anxiety   . Arthritis    knees  . Breast cancer ([place]) 1999  . CHF (congestive heart failure) ([place])    Related to severe mitral regurgitation, April, 2013  . 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 Assess by pulmonary team in the hospital April, 2013  . Ejection fraction    EF 60%, echo, April, 2013, with severe MR before mitral valve replacement  . Herpes   . Hypothyroidism   . IBS (irritable bowel syndrome)   . Mitral valve regurgitation    Mitral valve replacement April, 2013, Mitral valve prolapse  . Multiple sclerosis ([place])   . Neurogenic bladder   . Osteoporosis   . Ovarian cyst   . Paroxysmal atrial fibrillation (HCC)    Rapid atrial fibrillation in-hospital, Rapid cardioversion,  before mitral valve surgery  . Pulmonary hypertension ([place])    Echo, April, 2013, before mitral valve surgery  . S/P Maze operation for atrial fibrillation 01/26/2012   Complete biatrial lesion set using cryothermy via right mini thoracotomy  . S/P mitral valve replacement 01/26/2012   1m Sorin Carbomedics Optiform mechanical prosthesis via right mini thoracotomy  . Warfarin anticoagulation    Mechanical mitral prosthesis, April, 20136    Past Surgical History:  Procedure Laterality  Date  . BREAST LUMPECTOMY Right 1999   with sent.node, and axillary dissection (20)  . CHEST TUBE INSERTION  01/26/2012   Procedure: CHEST TUBE INSERTION;  Surgeon: CRexene Alberts MD;  [facility]  Service: Open Heart Surgery;  Laterality: Left;  . COLONOSCOPY  2010   "normal"  . CYSTOSCOPY  1992  . LAPAROSCOPIC OVARIAN CYSTECTOMY  1978   urethral stricture repair  . LEFT AND RIGHT HEART CATHETERIZATION WITH CORONARY ANGIOGRAM N/A 01/20/2012   Procedure: LEFT AND RIGHT HEART CATHETERIZATION WITH CORONARY ANGIOGRAM;  Surgeon: CBurnell Blanks MD;  [facility];  Service: Cardiovascular;  Laterality: N/A;  . LYMPHADENECTOMY    . MAZE  01/26/2012   Procedure: MAZE;  Surgeon: CRexene Alberts MD;  [facility]  Service: Open Heart Surgery;  Laterality: N/A;  . MITRAL VALVE REPLACEMENT  01/26/2012   Procedure: MINIMALLY INVASIVE MITRAL VALVE (MV) REPLACEMENT;  Surgeon: CRexene Alberts MD;  [place]  Service: Open Heart Surgery;  Laterality: Right;  . TEE WITHOUT CARDIOVERSION  01/19/2012   Procedure: TRANSESOPHAGEAL ECHOCARDIOGRAM (TEE);  Surgeon: Peter M JMartinique MD;  [facility];  Service: Cardiovascular;  Laterality: N/A;  . TONSILLECTOMY  1970  . [facility] . WRIST SURGERY Right 2012    There were no vitals filed for this visit.  Subjective Assessment - 12/20/19 1538  Subjective  Patient reports that she is feeling tired but doing okay    Currently in Pain?  No/denies         [facility] PT Assessment - 12/20/19 0001      Timed Up and Go Test   Normal TUG (seconds)  35                   [facility] Adult PT Treatment/Exercise - 12/20/19 0001      High Level Balance   High Level Balance Activities  Side stepping;Marching backwards;Marching forwards    High Level Balance Comments  seated balance ball catch and toss      Lumbar Exercises: Aerobic   Nustep  lvl 4 x 7 min      Lumbar Exercises: Seated   Other Seated  Lumbar Exercises  Blue Tband rows 2x15, red tband arm extension, bosu behind partial sit ups    Other Seated Lumbar Exercises  passive thoracic extension and chest opening, then weighted ball over head reach and look, then left hip to right overhead shoulder and vice versa elbow to mat on right and left      Knee/Hip Exercises: Standing   Hip Flexion  Both;2 sets;10 reps    Hip Abduction  Both;2 sets;10 reps    Abduction Limitations  3#    Hip Extension  Both;2 sets;10 reps    Extension Limitations  3#               PT Short Term Goals - 08/23/19 1541      PT SHORT TERM GOAL #2   Title  Patient to decrease 5x sit to stand time to 15 seconds or less to decrease fall risk.    Status  On-going        PT Long Term Goals - 12/20/19 1619      PT LONG TERM GOAL #2   Title  Pt will amb. 500' over even/uneven terrain with LRAD at MOD I level to improve functional mobility.    Baseline  5/8 720 ft with rollator modified independent    Status  Partially Met      PT LONG TERM GOAL #3   Title  Pt will amb. 150' with SPC at MOD I level (indoors) in order to safely amb. at home.     Status  Partially Met            Plan - 12/20/19 1617    Clinical Impression Statement  Worked some on her endurance, tried to have her go faster with bigger strokes on the NuStep, worked on some balance while seated trying to get some core, She is doing better with her walking but at times she is easily distracted and tends to start shuffling putting her at a higher risk for falls    PT Next Visit Plan  continue to work on core and walking for increased safety and function    Consulted and Agree with Plan of Care  Patient       Patient will benefit from skilled therapeutic intervention in order to improve the following deficits and impairments:  Decreased mobility, Postural dysfunction, Decreased strength, Impaired flexibility, Difficulty walking, Decreased balance  Visit Diagnosis: Other  abnormalities of gait and mobility  Unsteadiness on feet  Muscle weakness (generalized)     Problem List Patient Active Problem List   Diagnosis Date Noted  . Osteoporosis 07/05/2019  . Nonischemic cardiomyopathy ([place]) 07/20/2018  . Chest pain 07/20/2018  . Scoliosis 11/20/2016  .  Long term current use of anticoagulant 09/18/2016  . Compression fracture of lumbar spine, non-traumatic, sequela 08/11/2016  . Multiple falls 08/11/2016  . At high risk for injury related to fall 08/11/2016  . Encounter for therapeutic drug monitoring 11/27/2013  . Hypothyroidism   . CHF (congestive heart failure) ([place])   . Paroxysmal atrial fibrillation (HCC)   . Arthritis   . Neurogenic bladder   . Warfarin anticoagulation   . First degree heart block 01/29/2012  . 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 S/P Maze operation for atrial fibrillation 01/26/2012  . Anxiety 01/18/2012  . Multiple sclerosis ([place]) 01/19/2007    Sumner Boast., PT 12/20/2019, 4:24 PM  [address] [place], [place], [address] Phone: [phone]   Fax:  [phone]  Name: [name] [name] MRN: [id number] Date of Birth: 12/22/1950

## 2019-12-21 ENCOUNTER — Ambulatory Visit: Payer: Medicare Other | Admitting: Physical Therapy

## 2019-12-21 ENCOUNTER — Encounter: Payer: Self-pay | Admitting: Physical Therapy

## 2019-12-21 ENCOUNTER — Other Ambulatory Visit: Payer: Self-pay

## 2019-12-21 DIAGNOSIS — R2681 Unsteadiness on feet: Secondary | ICD-10-CM | POA: Diagnosis not present

## 2019-12-21 DIAGNOSIS — R2689 Other abnormalities of gait and mobility: Secondary | ICD-10-CM | POA: Diagnosis not present

## 2019-12-21 DIAGNOSIS — M6281 Muscle weakness (generalized): Secondary | ICD-10-CM | POA: Diagnosis not present

## 2019-12-21 NOTE — Therapy (Signed)
[place] [address] [place], [place], [address] Phone: [phone]   Fax:  [phone]  Physical Therapy Treatment  Patient Details  Name: [name] [name] MRN: [id number] Date of Birth: [birth year] Referring Provider (PT): Howard Pouch   Encounter Date: 12/21/2019  PT End of Session - 12/21/19 1621    Visit Number  33    Date for PT Re-Evaluation  12/24/19    PT Start Time  1515    PT Stop Time  1600    PT Time Calculation (min)  45 min    Activity Tolerance  Patient tolerated treatment well    Behavior During Therapy  [facility] for tasks assessed/performed       Past Medical History:  Diagnosis Date  . Anxiety   . Arthritis    knees  . Breast cancer ([place]) 1999  . CHF (congestive heart failure) ([place])    Related to severe mitral regurgitation, April, 2013  . 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 Assess by pulmonary team in the hospital April, 2013  . Ejection fraction    EF 60%, echo, April, 2013, with severe MR before mitral valve replacement  . Herpes   . Hypothyroidism   . IBS (irritable bowel syndrome)   . Mitral valve regurgitation    Mitral valve replacement April, 2013, Mitral valve prolapse  . Multiple sclerosis ([place])   . Neurogenic bladder   . Osteoporosis   . Ovarian cyst   . Paroxysmal atrial fibrillation (HCC)    Rapid atrial fibrillation in-hospital, Rapid cardioversion,  before mitral valve surgery  . Pulmonary hypertension ([place])    Echo, April, 2013, before mitral valve surgery  . S/P Maze operation for atrial fibrillation 01/26/2012   Complete biatrial lesion set using cryothermy via right mini thoracotomy  . S/P mitral valve replacement 01/26/2012   36m Sorin [employer] Optiform mechanical prosthesis via right mini thoracotomy  . Warfarin anticoagulation    Mechanical mitral prosthesis, April, 20136    Past Surgical History:  Procedure Laterality  Date  . BREAST LUMPECTOMY Right 1999   with sent.node, and axillary dissection (20)  . CHEST TUBE INSERTION  01/26/2012   Procedure: CHEST TUBE INSERTION;  Surgeon: CRexene Alberts MD;  [place]  Service: Open Heart Surgery;  Laterality: Left;  . COLONOSCOPY  2010   "normal"  . CYSTOSCOPY  1992  . LAPAROSCOPIC OVARIAN CYSTECTOMY  1978   urethral stricture repair  . LEFT AND RIGHT HEART CATHETERIZATION WITH CORONARY ANGIOGRAM N/A 01/20/2012   Procedure: LEFT AND RIGHT HEART CATHETERIZATION WITH CORONARY ANGIOGRAM;  Surgeon: CBurnell Blanks MD;  [facility];  Service: Cardiovascular;  Laterality: N/A;  . LYMPHADENECTOMY    . MAZE  01/26/2012   Procedure: MAZE;  Surgeon: CRexene Alberts MD;  [place]: [place]  Service: Open Heart Surgery;  Laterality: N/A;  . MITRAL VALVE REPLACEMENT  01/26/2012   Procedure: MINIMALLY INVASIVE MITRAL VALVE (MV) REPLACEMENT;  Surgeon: CRexene Alberts MD;  [place]  Service: Open Heart Surgery;  Laterality: Right;  . TEE WITHOUT CARDIOVERSION  01/19/2012   Procedure: TRANSESOPHAGEAL ECHOCARDIOGRAM (TEE);  Surgeon: Peter M JMartinique MD;  [facility];  Service: Cardiovascular;  Laterality: N/A;  . TONSILLECTOMY  1970  . [facility] . WRIST SURGERY Right 2012    There were no vitals filed for this visit.  Subjective Assessment - 12/21/19 1520  Subjective  Pt report some L knee pain this morning when she got up.    Currently in Pain?  No/denies                       [facility] Adult PT Treatment/Exercise - 12/21/19 0001      Ambulation/Gait   Ambulation/Gait  --    Ambulation/Gait Assistance  --    Stairs  Yes    Stairs Assistance  5: Supervision    Stair Management Technique  Two rails;Alternating pattern;Forwards    Number of Stairs  24    Height of Stairs  6    Gait Comments  Gait 80 ft with SPC decrease bilat hip and knee flex. Gait without AD from front loby to car down small  slope 45 ft CGA small short steps.      High Level Balance   High Level Balance Comments  seated balance ball catch and toss      Lumbar Exercises: Aerobic   Nustep  lvl 4 x 7 min      Lumbar Exercises: Seated   Other Seated Lumbar Exercises  Blue Tband rows 2x15, red tband arm extension, bosu behind partial sit ups      Knee/Hip Exercises: Standing   Other Standing Knee Exercises  Standing March with SPC 2x10                PT Short Term Goals - 08/23/19 1541      PT SHORT TERM GOAL #2   Title  Patient to decrease 5x sit to stand time to 15 seconds or less to decrease fall risk.    Status  On-going        PT Long Term Goals - 12/20/19 1619      PT LONG TERM GOAL #2   Title  Pt will amb. 500' over even/uneven terrain with LRAD at MOD I level to improve functional mobility.    Baseline  5/8 720 ft with rollator modified independent    Status  Partially Met      PT LONG TERM GOAL #3   Title  Pt will amb. 150' with SPC at MOD I level (indoors) in order to safely amb. at home.     Status  Partially Met            Plan - 12/21/19 1622    Clinical Impression Statement  Pt able to complete all of the interventions. Postural weakness notes with second set of rows. Cues to slow down with standing march more so when flexing R hip. Stair negotiation with bilat rail use and posterior lean. Gait from start of hall to front lobby cues for proper [facility] sequence    Personal Factors and Comorbidities  Age;Fitness;Comorbidity 3+    Comorbidities  COPD, HTN, Osteoporosis, mitral valve replacement 2018, breast CA, arthritis bil knees    Examination-Activity Limitations  Locomotion Level;Transfers;Stairs;Stand    Examination-Participation Restrictions  Community Activity    Stability/Clinical Decision Making  Evolving/Moderate complexity    Rehab Potential  Good    PT Frequency  2x / week    PT Duration  4 weeks    PT Treatment/Interventions  ADLs/Self Care Home Management;Gait  training;Stair training;Therapeutic activities;Therapeutic exercise;Balance training;Neuromuscular re-education;Patient/family education;Manual techniques    PT Next Visit Plan  continue to work on core and walking for increased safety and function       Patient will benefit from skilled therapeutic intervention in order to improve the following  deficits and impairments:  Decreased mobility, Postural dysfunction, Decreased strength, Impaired flexibility, Difficulty walking, Decreased balance  Visit Diagnosis: Unsteadiness on feet  Other abnormalities of gait and mobility  Muscle weakness (generalized)     Problem List Patient Active Problem List   Diagnosis Date Noted  . Osteoporosis 07/05/2019  . Nonischemic cardiomyopathy ([place]) 07/20/2018  . Chest pain 07/20/2018  . Scoliosis 11/20/2016  . Long term current use of anticoagulant 09/18/2016  . Compression fracture of lumbar spine, non-traumatic, sequela 08/11/2016  . Multiple falls 08/11/2016  . At high risk for injury related to fall 08/11/2016  . Encounter for therapeutic drug monitoring 11/27/2013  . Hypothyroidism   . CHF (congestive heart failure) ([place])   . Paroxysmal atrial fibrillation (HCC)   . Arthritis   . Neurogenic bladder   . Warfarin anticoagulation   . First degree heart block 01/29/2012  . 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 S/P Maze operation for atrial fibrillation 01/26/2012  . Anxiety 01/18/2012  . Multiple sclerosis ([place]) 01/19/2007    Scot Jun, PTA 12/21/2019, 4:26 PM  [address] [place], [place], [address] Phone: [phone]   Fax:  [phone]  Name: [name] [name] MRN: [id number] Date of Birth: 12-22-1950

## 2019-12-25 ENCOUNTER — Ambulatory Visit (INDEPENDENT_AMBULATORY_CARE_PROVIDER_SITE_OTHER): Payer: Medicare Other | Admitting: *Deleted

## 2019-12-25 ENCOUNTER — Other Ambulatory Visit: Payer: Self-pay

## 2019-12-25 DIAGNOSIS — Z9889 Other specified postprocedural states: Secondary | ICD-10-CM

## 2019-12-25 DIAGNOSIS — Z8679 Personal history of other diseases of the circulatory system: Secondary | ICD-10-CM

## 2019-12-25 DIAGNOSIS — Z5181 Encounter for therapeutic drug level monitoring: Secondary | ICD-10-CM | POA: Diagnosis not present

## 2019-12-25 DIAGNOSIS — Z952 Presence of prosthetic heart valve: Secondary | ICD-10-CM | POA: Diagnosis not present

## 2019-12-25 LAB — POCT INR: INR: 3.1 — AB (ref 2.0–3.0)

## 2019-12-25 NOTE — Patient Instructions (Signed)
Description   Continue taking 1 tablet everyday. Recheck in 6 weeks. Call with any new medications or procedures [phone]

## 2019-12-27 ENCOUNTER — Ambulatory Visit: Payer: Medicare Other | Admitting: Physical Therapy

## 2019-12-27 ENCOUNTER — Encounter: Payer: Self-pay | Admitting: Physical Therapy

## 2019-12-27 ENCOUNTER — Other Ambulatory Visit: Payer: Self-pay

## 2019-12-27 DIAGNOSIS — R2689 Other abnormalities of gait and mobility: Secondary | ICD-10-CM | POA: Diagnosis not present

## 2019-12-27 DIAGNOSIS — M6281 Muscle weakness (generalized): Secondary | ICD-10-CM | POA: Diagnosis not present

## 2019-12-27 DIAGNOSIS — R2681 Unsteadiness on feet: Secondary | ICD-10-CM | POA: Diagnosis not present

## 2019-12-27 NOTE — Therapy (Signed)
[place], [place], [address] Phone: [phone]   Fax:  [phone]  Physical Therapy Treatment  Patient Details  Name: [name] [name] MRN: [id number] Date of Birth: 07-26-1951 Referring Provider (PT): Howard Pouch   Encounter Date: 12/27/2019  PT End of Session - 12/27/19 1406    Visit Number  34    Date for PT Re-Evaluation  01/27/20    PT Start Time  6384    PT Stop Time  1401    PT Time Calculation (min)  48 min    Activity Tolerance  Patient tolerated treatment well    Behavior During Therapy  [facility] for tasks assessed/performed       Past Medical History:  Diagnosis Date  . Anxiety   . Arthritis    knees  . Breast cancer ([place]) 1999  . CHF (congestive heart failure) ([place])    Related to severe mitral regurgitation, April, 2013  . 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 Assess by pulmonary team in the hospital April, 2013  . Ejection fraction    EF 60%, echo, April, 2013, with severe MR before mitral valve replacement  . Herpes   . Hypothyroidism   . IBS (irritable bowel syndrome)   . Mitral valve regurgitation    Mitral valve replacement April, 2013, Mitral valve prolapse  . Multiple sclerosis ([place])   . Neurogenic bladder   . Osteoporosis   . Ovarian cyst   . Paroxysmal atrial fibrillation (HCC)    Rapid atrial fibrillation in-hospital, Rapid cardioversion,  before mitral valve surgery  . Pulmonary hypertension ([place])    Echo, April, 2013, before mitral valve surgery  . S/P Maze operation for atrial fibrillation 01/26/2012   Complete biatrial lesion set using cryothermy via right mini thoracotomy  . S/P mitral valve replacement 01/26/2012   78m Sorin [employer] Optiform mechanical prosthesis via right mini thoracotomy  . Warfarin anticoagulation    Mechanical mitral prosthesis, April, 20136    Past Surgical History:  Procedure Laterality  Date  . BREAST LUMPECTOMY Right 1999   with sent.node, and axillary dissection (20)  . CHEST TUBE INSERTION  01/26/2012   Procedure: CHEST TUBE INSERTION;  Surgeon: CRexene Alberts MD;  [place]  Service: Open Heart Surgery;  Laterality: Left;  . COLONOSCOPY  2010   "normal"  . CYSTOSCOPY  1992  . LAPAROSCOPIC OVARIAN CYSTECTOMY  1978   urethral stricture repair  . LEFT AND RIGHT HEART CATHETERIZATION WITH CORONARY ANGIOGRAM N/A 01/20/2012   Procedure: LEFT AND RIGHT HEART CATHETERIZATION WITH CORONARY ANGIOGRAM;  Surgeon: CBurnell Blanks MD;  [facility];  Service: Cardiovascular;  Laterality: N/A;  . LYMPHADENECTOMY    . MAZE  01/26/2012   Procedure: MAZE;  Surgeon: CRexene Alberts MD;  [place] [facility]  Service: Open Heart Surgery;  Laterality: N/A;  . MITRAL VALVE REPLACEMENT  01/26/2012   Procedure: MINIMALLY INVASIVE MITRAL VALVE (MV) REPLACEMENT;  Surgeon: CRexene Alberts MD;  [place] [facility]  Service: Open Heart Surgery;  Laterality: Right;  . TEE WITHOUT CARDIOVERSION  01/19/2012   Procedure: TRANSESOPHAGEAL ECHOCARDIOGRAM (TEE);  Surgeon: Peter M JMartinique MD;  [facility];  Service: Cardiovascular;  Laterality: N/A;  . TONSILLECTOMY  1970  . [place] . WRIST SURGERY Right 2012    There were no vitals filed for this visit.  Subjective Assessment - 12/27/19 1324  Subjective  Patient reports that she struggles with the cane and sequencing.  Reports tha tshe was very tired after the last treatment         [facility] PT Assessment - 12/27/19 0001      Assessment   Medical Diagnosis  gait instability    Referring Provider (PT)  [place] Adult PT Treatment/Exercise - 12/27/19 0001      Ambulation/Gait   Gait Comments  gait 2x80 feet with SPC a lot of cues to take larger steps, and for posture, she tends to really shuffle with transitional turns      High Level Balance   High Level  Balance Comments  seated balance ball catch and toss, sit to stand with ball toss and catch      Lumbar Exercises: Aerobic   Nustep  lvl 4 x 7 min      Lumbar Exercises: Seated   Other Seated Lumbar Exercises  bosu behind partial sit ups      Knee/Hip Exercises: Machines for Strengthening   Cybex Knee Extension  5# 3x10    Cybex Knee Flexion  25lb 2x10     Other Machine  lat pulls 15# 2x10. chest press 5# 2x10               PT Short Term Goals - 08/23/19 1541      PT SHORT TERM GOAL #2   Title  Patient to decrease 5x sit to stand time to 15 seconds or less to decrease fall risk.    Status  On-going        PT Long Term Goals - 12/27/19 1434      PT LONG TERM GOAL #1   Title  Independent with HEP for strengthening and flexibility to increase function.    Status  Achieved      PT LONG TERM GOAL #2   Title  Pt will amb. 500' over even/uneven terrain with LRAD at MOD I level to improve functional mobility.    Status  Partially Met      PT LONG TERM GOAL #3   Title  Pt will amb. 150' with SPC at MOD I level (indoors) in order to safely amb. at home.     Status  Partially Met      PT LONG TERM GOAL #4   Title  Patient able to climb stairs with a reciprocal gait pattern and BUE support    Status  Partially Met      PT LONG TERM GOAL #5   Title  Patient will demostrate reduced risk of falling reflected by incr Berg Balance Assessment to 39/56    Status  Partially Met            Plan - 12/27/19 1407    Clinical Impression Statement  Patient really only having issues with walking with the [facility], she tends to shuffle and report difficulty with sequencing, I think she was just overthingking things and this was causing her to go to slow, other issue is the shuffling and hesitation/festenation with tranfers, we are currently working on her safety and functional gait with the [facility] for home.    PT Next Visit Plan  continue to work on core and walking for increased safety  and function    Consulted and Agree with Plan of Care  Patient       Patient will  benefit from skilled therapeutic intervention in order to improve the following deficits and impairments:  Decreased mobility, Postural dysfunction, Decreased strength, Impaired flexibility, Difficulty walking, Decreased balance  Visit Diagnosis: Unsteadiness on feet - Plan: PT plan of care cert/re-cert  Other abnormalities of gait and mobility - Plan: PT plan of care cert/re-cert  Muscle weakness (generalized) - Plan: PT plan of care cert/re-cert     Problem List Patient Active Problem List   Diagnosis Date Noted  . Osteoporosis 07/05/2019  . Nonischemic cardiomyopathy ([place]) 07/20/2018  . Chest pain 07/20/2018  . Scoliosis 11/20/2016  . Long term current use of anticoagulant 09/18/2016  . Compression fracture of lumbar spine, non-traumatic, sequela 08/11/2016  . Multiple falls 08/11/2016  . At high risk for injury related to fall 08/11/2016  . Encounter for therapeutic drug monitoring 11/27/2013  . Hypothyroidism   . CHF (congestive heart failure) ([place])   . Paroxysmal atrial fibrillation (HCC)   . Arthritis   . Neurogenic bladder   . Warfarin anticoagulation   . First degree heart block 01/29/2012  . 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 S/P Maze operation for atrial fibrillation 01/26/2012  . Anxiety 01/18/2012  . Multiple sclerosis ([place]) 01/19/2007    Sumner Boast., PT 12/27/2019, 2:36 PM  [address] [place], [place], [address] Phone: [phone]   Fax:  [phone]  Name: MAEOLA MCHANEY MRN: [id number] Date of Birth: 15-Apr-1951

## 2019-12-28 ENCOUNTER — Ambulatory Visit: Payer: Medicare Other | Admitting: Physical Therapy

## 2019-12-28 ENCOUNTER — Encounter: Payer: Self-pay | Admitting: Physical Therapy

## 2019-12-28 DIAGNOSIS — M6281 Muscle weakness (generalized): Secondary | ICD-10-CM

## 2019-12-28 DIAGNOSIS — R2681 Unsteadiness on feet: Secondary | ICD-10-CM | POA: Diagnosis not present

## 2019-12-28 DIAGNOSIS — R2689 Other abnormalities of gait and mobility: Secondary | ICD-10-CM

## 2019-12-28 NOTE — Therapy (Signed)
[place], [place], [address] Phone: [phone]   Fax:  [phone]  Physical Therapy Treatment  Patient Details  Name: [name] [name] MRN: [id number] Date of Birth: [birth year] Referring Provider (PT): Howard Pouch   Encounter Date: 12/28/2019  PT End of Session - 12/28/19 1605    Visit Number  35    Date for PT Re-Evaluation  01/27/20    PT Start Time  1524    PT Stop Time  1605    PT Time Calculation (min)  41 min    Activity Tolerance  Patient tolerated treatment well    Behavior During Therapy  [facility] for tasks assessed/performed       Past Medical History:  Diagnosis Date  . Anxiety   . Arthritis    knees  . Breast cancer ([place]) 1999  . CHF (congestive heart failure) ([place])    Related to severe mitral regurgitation, April, 2013  . 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 Assess by pulmonary team in the hospital April, 2013  . Ejection fraction    EF 60%, echo, April, 2013, with severe MR before mitral valve replacement  . Herpes   . Hypothyroidism   . IBS (irritable bowel syndrome)   . Mitral valve regurgitation    Mitral valve replacement April, 2013, Mitral valve prolapse  . Multiple sclerosis ([place])   . Neurogenic bladder   . Osteoporosis   . Ovarian cyst   . Paroxysmal atrial fibrillation (HCC)    Rapid atrial fibrillation in-hospital, Rapid cardioversion,  before mitral valve surgery  . Pulmonary hypertension ([place])    Echo, April, 2013, before mitral valve surgery  . S/P Maze operation for atrial fibrillation 01/26/2012   Complete biatrial lesion set using cryothermy via right mini thoracotomy  . S/P mitral valve replacement 01/26/2012   39m Sorin [employer] Optiform mechanical prosthesis via right mini thoracotomy  . Warfarin anticoagulation    Mechanical mitral prosthesis, April, 20136    Past Surgical History:  Procedure Laterality  Date  . BREAST LUMPECTOMY Right 1999   with sent.node, and axillary dissection (20)  . CHEST TUBE INSERTION  01/26/2012   Procedure: CHEST TUBE INSERTION;  Surgeon: CRexene Alberts MD;  [place]  Service: Open Heart Surgery;  Laterality: Left;  . COLONOSCOPY  2010   "normal"  . CYSTOSCOPY  1992  . LAPAROSCOPIC OVARIAN CYSTECTOMY  1978   urethral stricture repair  . LEFT AND RIGHT HEART CATHETERIZATION WITH CORONARY ANGIOGRAM N/A 01/20/2012   Procedure: LEFT AND RIGHT HEART CATHETERIZATION WITH CORONARY ANGIOGRAM;  Surgeon: CBurnell Blanks MD;  [facility];  Service: Cardiovascular;  Laterality: N/A;  . LYMPHADENECTOMY    . MAZE  01/26/2012   Procedure: MAZE;  Surgeon: CRexene Alberts MD;  [place]  Service: Open Heart Surgery;  Laterality: N/A;  . MITRAL VALVE REPLACEMENT  01/26/2012   Procedure: MINIMALLY INVASIVE MITRAL VALVE (MV) REPLACEMENT;  Surgeon: CRexene Alberts MD;  [place]  Service: Open Heart Surgery;  Laterality: Right;  . TEE WITHOUT CARDIOVERSION  01/19/2012   Procedure: TRANSESOPHAGEAL ECHOCARDIOGRAM (TEE);  Surgeon: Peter M JMartinique MD;  [facility];  Service: Cardiovascular;  Laterality: N/A;  . TONSILLECTOMY  1970  . [place] WRIST SURGERY Right 2012    There were no vitals filed for this visit.  Subjective Assessment - 12/28/19 1527  Subjective  "I am doing good except mu knees are bothering me"    Currently in Pain?  Yes    Pain Score  3     Pain Location  Knee    Pain Orientation  Left                       [facility] Adult PT Treatment/Exercise - 12/28/19 0001      High Level Balance   High Level Balance Activities  Side stepping;Backward walking;Figure 8 turns;Negotitating around obstacles    High Level Balance Comments  seated balance ball catch and toss      Lumbar Exercises: Aerobic   Nustep  lvl 4 x 7 min      Lumbar Exercises: Seated   Other Seated Lumbar  Exercises  bosu behind partial sit ups      Knee/Hip Exercises: Standing   Other Standing Knee Exercises  Alt box taps 4in with SPC 2x10               PT Short Term Goals - 08/23/19 1541      PT SHORT TERM GOAL #2   Title  Patient to decrease 5x sit to stand time to 15 seconds or less to decrease fall risk.    Status  On-going        PT Long Term Goals - 12/27/19 1434      PT LONG TERM GOAL #1   Title  Independent with HEP for strengthening and flexibility to increase function.    Status  Achieved      PT LONG TERM GOAL #2   Title  Pt will amb. 500' over even/uneven terrain with LRAD at MOD I level to improve functional mobility.    Status  Partially Met      PT LONG TERM GOAL #3   Title  Pt will amb. 150' with SPC at MOD I level (indoors) in order to safely amb. at home.     Status  Partially Met      PT LONG TERM GOAL #4   Title  Patient able to climb stairs with a reciprocal gait pattern and BUE support    Status  Partially Met      PT LONG TERM GOAL #5   Title  Patient will demostrate reduced risk of falling reflected by incr Berg Balance Assessment to 39/56    Status  Partially Met            Plan - 12/28/19 1606    Clinical Impression Statement  9 minutes late for today's session. She did well overall. Some instability throughout session with balance activities. Some difficulty lifting LLE to tap 4 inch box. All balance interventions were slow small shuffling steps. Decrease step length with LLE with backwards walking. Increase time needed to do figure eights and negotiation around objects.    Personal Factors and Comorbidities  Age;Fitness;Comorbidity 3+    Comorbidities  COPD, HTN, Osteoporosis, mitral valve replacement 2018, breast CA, arthritis bil knees    Examination-Activity Limitations  Locomotion Level;Transfers;Stairs;Stand    Examination-Participation Restrictions  Community Activity       Patient will benefit from skilled therapeutic  intervention in order to improve the following deficits and impairments:  Decreased mobility, Postural dysfunction, Decreased strength, Impaired flexibility, Difficulty walking, Decreased balance  Visit Diagnosis: Muscle weakness (generalized)  Other abnormalities of gait and mobility  Unsteadiness on feet     Problem List Patient Active Problem List  Diagnosis Date Noted  . Osteoporosis 07/05/2019  . Nonischemic cardiomyopathy ([place]) 07/20/2018  . Chest pain 07/20/2018  . Scoliosis 11/20/2016  . Long term current use of anticoagulant 09/18/2016  . Compression fracture of lumbar spine, non-traumatic, sequela 08/11/2016  . Multiple falls 08/11/2016  . At high risk for injury related to fall 08/11/2016  . Encounter for therapeutic drug monitoring 11/27/2013  . Hypothyroidism   . CHF (congestive heart failure) ([place])   . Paroxysmal atrial fibrillation (HCC)   . Arthritis   . Neurogenic bladder   . Warfarin anticoagulation   . First degree heart block 01/29/2012  . 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 S/P Maze operation for atrial fibrillation 01/26/2012  . Anxiety 01/18/2012  . Multiple sclerosis ([place]) 01/19/2007    Scot Jun 12/28/2019, 4:09 PM  [address] [place], [place], [address] Phone: [phone]   Fax:  [phone]  Name: [name] [name] MRN: [id number] Date of Birth: May 19, 1951

## 2019-12-29 ENCOUNTER — Other Ambulatory Visit: Payer: Self-pay | Admitting: Cardiology

## 2020-01-01 ENCOUNTER — Telehealth: Payer: Self-pay

## 2020-01-01 NOTE — Telephone Encounter (Signed)
Pt states Holistic doctor has advised against her getting the COVID vaccine due to artificial heart valve and MS. If she gets the vaccine she, and the holistic MD, is concerned it will kick her immune system in overdrive. Neurologist told her he respected the holistic MD, and they advised her initially not to get the vaccine, but now neuro is telling her it is safe. She would like Dr Lucita Lora opinion on whether she should get it or not. Pt was very nervous on phone, tearful, with not knowing what to do.

## 2020-01-01 NOTE — Telephone Encounter (Signed)
Patient requesting [name] [name] return call to discuss COVID vaccine.  She has multiple conditions and is considering not taking vaccine.  Attempted to discuss concerns with patient, continues to request call back from [place].

## 2020-01-01 NOTE — Telephone Encounter (Signed)
Per the CDC- The only individuals who should not receive mRNA COVID-19 vaccines, are those with a history of severe allergic reaction, including anaphylaxis, to components of the vaccine.   The Covid vaccine creates an immune response as does any other vaccine.  Most people are not reporting side effects outside of  sore arm or mild headache.  Some have fever, chills, fatigue and body aches for  24 hours.  This is simply an immune response and is not harmful.   I would be more concerned with her risk of complication with possible Covid infection given her  chronic conditions, more so than she would be at risk with the vaccination.   There is no 100% correct answer I can give her.  This is a personal decision she must make on her own.  However, per the CDC there is no medical reason she should not get the vaccine.

## 2020-01-01 NOTE — Telephone Encounter (Signed)
Pt was called and given information, she verbalized understanding  

## 2020-01-02 ENCOUNTER — Ambulatory Visit: Payer: Medicare Other | Admitting: Physical Therapy

## 2020-01-04 ENCOUNTER — Encounter: Payer: Medicare Other | Admitting: Physical Therapy

## 2020-01-08 ENCOUNTER — Ambulatory Visit: Payer: Medicare Other | Admitting: Physical Therapy

## 2020-01-08 ENCOUNTER — Other Ambulatory Visit: Payer: Self-pay

## 2020-01-08 ENCOUNTER — Encounter: Payer: Self-pay | Admitting: Physical Therapy

## 2020-01-08 DIAGNOSIS — M6281 Muscle weakness (generalized): Secondary | ICD-10-CM | POA: Diagnosis not present

## 2020-01-08 DIAGNOSIS — R2681 Unsteadiness on feet: Secondary | ICD-10-CM

## 2020-01-08 DIAGNOSIS — R2689 Other abnormalities of gait and mobility: Secondary | ICD-10-CM | POA: Diagnosis not present

## 2020-01-08 NOTE — Therapy (Signed)
[place], [place], [address] Phone: [phone]   Fax:  [phone]  Physical Therapy Treatment  Patient Details  Name: [name] [name] MRN: [id number] Date of Birth: [birth year] Referring Provider (PT): Howard Pouch   Encounter Date: 01/08/2020  PT End of Session - 01/08/20 1512    Visit Number  36    Date for PT Re-Evaluation  01/27/20    PT Start Time  1430    PT Stop Time  1515    PT Time Calculation (min)  45 min    Activity Tolerance  Patient tolerated treatment well    Behavior During Therapy  [facility] for tasks assessed/performed       Past Medical History:  Diagnosis Date  . Anxiety   . Arthritis    knees  . Breast cancer ([place]) 1999  . CHF (congestive heart failure) ([place])    Related to severe mitral regurgitation, April, 2013  . 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 Assess by pulmonary team in the hospital April, 2013  . Ejection fraction    EF 60%, echo, April, 2013, with severe MR before mitral valve replacement  . Herpes   . Hypothyroidism   . IBS (irritable bowel syndrome)   . Mitral valve regurgitation    Mitral valve replacement April, 2013, Mitral valve prolapse  . Multiple sclerosis ([place])   . Neurogenic bladder   . Osteoporosis   . Ovarian cyst   . Paroxysmal atrial fibrillation (HCC)    Rapid atrial fibrillation in-hospital, Rapid cardioversion,  before mitral valve surgery  . Pulmonary hypertension ([place])    Echo, April, 2013, before mitral valve surgery  . S/P Maze operation for atrial fibrillation 01/26/2012   Complete biatrial lesion set using cryothermy via right mini thoracotomy  . S/P mitral valve replacement 01/26/2012   51m Sorin [employer] Optiform mechanical prosthesis via right mini thoracotomy  . Warfarin anticoagulation    Mechanical mitral prosthesis, April, 20136    Past Surgical History:  Procedure Laterality  Date  . BREAST LUMPECTOMY Right 1999   with sent.node, and axillary dissection (20)  . CHEST TUBE INSERTION  01/26/2012   Procedure: CHEST TUBE INSERTION;  Surgeon: CRexene Alberts MD;  [place]  Service: Open Heart Surgery;  Laterality: Left;  . COLONOSCOPY  2010   "normal"  . CYSTOSCOPY  1992  . LAPAROSCOPIC OVARIAN CYSTECTOMY  1978   urethral stricture repair  . LEFT AND RIGHT HEART CATHETERIZATION WITH CORONARY ANGIOGRAM N/A 01/20/2012   Procedure: LEFT AND RIGHT HEART CATHETERIZATION WITH CORONARY ANGIOGRAM;  Surgeon: CBurnell Blanks MD;  [facility];  Service: Cardiovascular;  Laterality: N/A;  . LYMPHADENECTOMY    . MAZE  01/26/2012   Procedure: MAZE;  Surgeon: CRexene Alberts MD;  [place]  Service: Open Heart Surgery;  Laterality: N/A;  . MITRAL VALVE REPLACEMENT  01/26/2012   Procedure: MINIMALLY INVASIVE MITRAL VALVE (MV) REPLACEMENT;  Surgeon: CRexene Alberts MD;  [place]  Service: Open Heart Surgery;  Laterality: Right;  . TEE WITHOUT CARDIOVERSION  01/19/2012   Procedure: TRANSESOPHAGEAL ECHOCARDIOGRAM (TEE);  Surgeon: Peter M JMartinique MD;  [facility];  Service: Cardiovascular;  Laterality: N/A;  . TONSILLECTOMY  1970  . [place] WRIST SURG[place] Right 2012    There were no vitals filed for this visit.  Subjective Assessment - 01/08/20 1436  Subjective  Pretty good knees did hurt some over the weekend    Currently in Pain?  No/denies                       [facility] Adult PT Treatment/Exercise - 01/08/20 0001      High Level Balance   High Level Balance Comments  seated balance ball catch and toss      Lumbar Exercises: Aerobic   Nustep  lvl 4 x 7 min      Lumbar Exercises: Standing   Other Standing Lumbar Exercises  Triceps ext 15lb 2x10       Lumbar Exercises: Seated   Other Seated Lumbar Exercises  bosu behind partial sit ups      Knee/Hip Exercises: Machines for  Strengthening   Cybex Knee Extension  5# 3x10    Cybex Knee Flexion  25lb 2x10     Other Machine  lat pulls 15# 2x10. chest press 5# 2x10               PT Short Term Goals - 08/23/19 1541      PT SHORT TERM GOAL #2   Title  Patient to decrease 5x sit to stand time to 15 seconds or less to decrease fall risk.    Status  On-going        PT Long Term Goals - 12/27/19 1434      PT LONG TERM GOAL #1   Title  Independent with HEP for strengthening and flexibility to increase function.    Status  Achieved      PT LONG TERM GOAL #2   Title  Pt will amb. 500' over even/uneven terrain with LRAD at MOD I level to improve functional mobility.    Status  Partially Met      PT LONG TERM GOAL #3   Title  Pt will amb. 150' with SPC at MOD I level (indoors) in order to safely amb. at home.     Status  Partially Met      PT LONG TERM GOAL #4   Title  Patient able to climb stairs with a reciprocal gait pattern and BUE support    Status  Partially Met      PT LONG TERM GOAL #5   Title  Patient will demostrate reduced risk of falling reflected by incr Berg Balance Assessment to 39/56    Status  Partially Met            Plan - 01/08/20 1512    Clinical Impression Statement  Went back to machine level interventions today. no report of pain. Cues needed to complete the exercises with full available ROM. Ambulated around the clinic without AD today and needed SBA. Cues to increase step length during gait.    Comorbidities  COPD, HTN, Osteoporosis, mitral valve replacement 2018, breast CA, arthritis bil knees    Examination-Activity Limitations  Locomotion Level;Transfers;Stairs;Stand    Examination-Participation Restrictions  Community Activity    Stability/Clinical Decision Making  Evolving/Moderate complexity    Rehab Potential  Good    PT Frequency  2x / week    PT Duration  4 weeks    PT Treatment/Interventions  ADLs/Self Care Home Management;Gait training;Stair  training;Therapeutic activities;Therapeutic exercise;Balance training;Neuromuscular re-education;Patient/family education;Manual techniques    PT Next Visit Plan  continue to work on core and walking for increased safety and function       Patient will benefit from skilled therapeutic intervention in order to  improve the following deficits and impairments:  Decreased mobility, Postural dysfunction, Decreased strength, Impaired flexibility, Difficulty walking, Decreased balance  Visit Diagnosis: Muscle weakness (generalized)  Other abnormalities of gait and mobility  Unsteadiness on feet     Problem List Patient Active Problem List   Diagnosis Date Noted  . Osteoporosis 07/05/2019  . Nonischemic cardiomyopathy ([place]) 07/20/2018  . Chest pain 07/20/2018  . Scoliosis 11/20/2016  . Long term current use of anticoagulant 09/18/2016  . Compression fracture of lumbar spine, non-traumatic, sequela 08/11/2016  . Multiple falls 08/11/2016  . At high risk for injury related to fall 08/11/2016  . Encounter for therapeutic drug monitoring 11/27/2013  . Hypothyroidism   . CHF (congestive heart failure) ([place])   . Paroxysmal atrial fibrillation (HCC)   . Arthritis   . Neurogenic bladder   . Warfarin anticoagulation   . First degree heart block 01/29/2012  . 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 S/P Maze operation for atrial fibrillation 01/26/2012  . Anxiety 01/18/2012  . Multiple sclerosis ([place]) 01/19/2007    Scot Jun, PTA 01/08/2020, 3:14 PM  [place] [place], [place], [address] Phone: [phone]   Fax:  [phone]  Name: [name] [name] MRN: [id number] Date of Birth: [birth year]

## 2020-01-11 ENCOUNTER — Other Ambulatory Visit: Payer: Self-pay

## 2020-01-11 ENCOUNTER — Encounter: Payer: Self-pay | Admitting: Physical Therapy

## 2020-01-11 ENCOUNTER — Ambulatory Visit: Payer: Medicare Other | Admitting: Physical Therapy

## 2020-01-11 DIAGNOSIS — M6281 Muscle weakness (generalized): Secondary | ICD-10-CM

## 2020-01-11 DIAGNOSIS — R2689 Other abnormalities of gait and mobility: Secondary | ICD-10-CM

## 2020-01-11 DIAGNOSIS — R2681 Unsteadiness on feet: Secondary | ICD-10-CM

## 2020-01-11 NOTE — Therapy (Signed)
[place], [place], [address] Phone: [phone]   Fax:  [phone]  Physical Therapy Treatment  Patient Details  Name: [name] [name] MRN: [id number] Date of Birth: September 28, 1951 Referring Provider (PT): Howard Pouch   Encounter Date: 01/11/2020  PT End of Session - 01/11/20 1613    Visit Number  37    Date for PT Re-Evaluation  01/27/20    PT Start Time  1530    PT Stop Time  1613    PT Time Calculation (min)  43 min    Activity Tolerance  Patient tolerated treatment well    Behavior During Therapy  [facility] for tasks assessed/performed       Past Medical History:  Diagnosis Date  . Anxiety   . Arthritis    knees  . Breast cancer ([place]) 1999  . CHF (congestive heart failure) ([place])    Related to severe mitral regurgitation, April, 2013  . 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 Assess by pulmonary team in the hospital April, 2013  . Ejection fraction    EF 60%, echo, April, 2013, with severe MR before mitral valve replacement  . Herpes   . Hypothyroidism   . IBS (irritable bowel syndrome)   . Mitral valve regurgitation    Mitral valve replacement April, 2013, Mitral valve prolapse  . Multiple sclerosis ([place])   . Neurogenic bladder   . Osteoporosis   . Ovarian cyst   . Paroxysmal atrial fibrillation (HCC)    Rapid atrial fibrillation in-hospital, Rapid cardioversion,  before mitral valve surgery  . Pulmonary hypertension ([place])    Echo, April, 2013, before mitral valve surgery  . S/P Maze operation for atrial fibrillation 01/26/2012   Complete biatrial lesion set using cryothermy via right mini thoracotomy  . S/P mitral valve replacement 01/26/2012   27m Sorin [employer] Optiform mechanical prosthesis via right mini thoracotomy  . Warfarin anticoagulation    Mechanical mitral prosthesis, April, 20136    Past Surgical History:  Procedure Laterality  Date  . BREAST LUMPECTOMY Right 1999   with sent.node, and axillary dissection (20)  . CHEST TUBE INSERTION  01/26/2012   Procedure: CHEST TUBE INSERTION;  Surgeon: CRexene Alberts MD;  Location: [place]  Service: Open Heart Surgery;  Laterality: Left;  . COLONOSCOPY  2010   "normal"  . CYSTOSCOPY  1992  . LAPAROSCOPIC OVARIAN CYSTECTOMY  1978   urethral stricture repair  . LEFT AND RIGHT HEART CATHETERIZATION WITH CORONARY ANGIOGRAM N/A 01/20/2012   Procedure: LEFT AND RIGHT HEART CATHETERIZATION WITH CORONARY ANGIOGRAM;  Surgeon: CBurnell Blanks MD;  [facility];  Service: Cardiovascular;  Laterality: N/A;  . LYMPHADENECTOMY    . MAZE  01/26/2012   Procedure: MAZE;  Surgeon: CRexene Alberts MD;  [place]  Service: Open Heart Surgery;  Laterality: N/A;  . MITRAL VALVE REPLACEMENT  01/26/2012   Procedure: MINIMALLY INVASIVE MITRAL VALVE (MV) REPLACEMENT;  Surgeon: CRexene Alberts MD;  [place]  Service: Open Heart Surgery;  Laterality: Right;  . TEE WITHOUT CARDIOVERSION  01/19/2012   Procedure: TRANSESOPHAGEAL ECHOCARDIOGRAM (TEE);  Surgeon: Peter M JMartinique MD;  [facility];  Service: Cardiovascular;  Laterality: N/A;  . TONSILLECTOMY  1970  . [place] . WRIST SURGERY Right 2012    There were no vitals filed for this visit.  Subjective Assessment - 01/11/20 1537  Subjective  Patient reports that her core was sore    Currently in Pain?  No/denies                       [facility] Adult PT Treatment/Exercise - 01/11/20 0001      Ambulation/Gait   Gait Comments  most gait in clinic was without device with close supervision and a lot of cues to pick up feet      Lumbar Exercises: Machines for Strengthening   Cybex Knee Extension  10lb 2 x 10    Cybex Knee Flexion  25lb 2 x 10    Other Lumbar Machine Exercise  5# straight arm pulls cues for core and posture    Other Lumbar Machine Exercise  20# triceps 2x10,  biceps 5# 2x10      Lumbar Exercises: Standing   Other Standing Lumbar Exercises  4" step toe touches with light CGA no AD    Other Standing Lumbar Exercises  back to wall overhead lift with weighted ball, W backs, yellow tband ER with back to wall, stretch with pull up bars      Lumbar Exercises: Seated   Other Seated Lumbar Exercises  bosu behind partial sit ups               PT Short Term Goals - 08/23/19 1541      PT SHORT TERM GOAL #2   Title  Patient to decrease 5x sit to stand time to 15 seconds or less to decrease fall risk.    Status  On-going        PT Long Term Goals - 01/11/20 1627      PT LONG TERM GOAL #2   Title  Pt will amb. 500' over even/uneven terrain with LRAD at MOD I level to improve functional mobility.    Status  Partially Met      PT LONG TERM GOAL #3   Title  Pt will amb. 150' with SPC at MOD I level (indoors) in order to safely amb. at home.     Status  Partially Met            Plan - 01/11/20 1625    Clinical Impression Statement  Patient is doing well, she wants to try to do more at home without device, she is walking better, but needs cues to pick up feet.  Also requires cues for posture.  She is willing to push but really will limit the session due to fatigue    PT Next Visit Plan  work on cores, function and balance    Consulted and Agree with Plan of Care  Patient       Patient will benefit from skilled therapeutic intervention in order to improve the following deficits and impairments:  Decreased mobility, Postural dysfunction, Decreased strength, Impaired flexibility, Difficulty walking, Decreased balance  Visit Diagnosis: Muscle weakness (generalized)  Other abnormalities of gait and mobility  Unsteadiness on feet     Problem List Patient Active Problem List   Diagnosis Date Noted  . Osteoporosis 07/05/2019  . Nonischemic cardiomyopathy ([place]) 07/20/2018  . Chest pain 07/20/2018  . Scoliosis 11/20/2016  . Long  term current use of anticoagulant 09/18/2016  . Compression fracture of lumbar spine, non-traumatic, sequela 08/11/2016  . Multiple falls 08/11/2016  . At high risk for injury related to fall 08/11/2016  . Encounter for therapeutic drug monitoring 11/27/2013  . Hypothyroidism   . CHF (congestive heart failure) ([place])   .  Paroxysmal atrial fibrillation (HCC)   . Arthritis   . Neurogenic bladder   . Warfarin anticoagulation   . First degree heart block 01/29/2012  . 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 S/P Maze operation for atrial fibrillation 01/26/2012  . Anxiety 01/18/2012  . Multiple sclerosis ([place]) 01/19/2007    Sumner Boast., PT 01/11/2020, 4:29 PM  [place] [address] [place], [place], [address] Phone: [phone]   Fax:  [phone]  Name: [name] [name] MRN: [id number] Date of Birth: [birth year]

## 2020-01-15 ENCOUNTER — Encounter: Payer: Medicare Other | Admitting: Physical Therapy

## 2020-01-17 ENCOUNTER — Encounter: Payer: Medicare Other | Admitting: Physical Therapy

## 2020-01-23 ENCOUNTER — Ambulatory Visit: Payer: Medicare Other | Attending: Family Medicine | Admitting: Physical Therapy

## 2020-01-23 ENCOUNTER — Encounter: Payer: Self-pay | Admitting: Physical Therapy

## 2020-01-23 ENCOUNTER — Other Ambulatory Visit: Payer: Self-pay

## 2020-01-23 DIAGNOSIS — R2689 Other abnormalities of gait and mobility: Secondary | ICD-10-CM | POA: Diagnosis not present

## 2020-01-23 DIAGNOSIS — M6281 Muscle weakness (generalized): Secondary | ICD-10-CM | POA: Diagnosis not present

## 2020-01-23 DIAGNOSIS — R2681 Unsteadiness on feet: Secondary | ICD-10-CM | POA: Insufficient documentation

## 2020-01-23 NOTE — Therapy (Signed)
[place] [address] [place], [place], [address] Phone: [phone]   Fax:  [phone]  Physical Therapy Treatment  Patient Details  Name: [name] [name] MRN: [id number] Date of Birth: 05-12-1951 Referring Provider (PT): Howard Pouch   Encounter Date: 01/23/2020  PT End of Session - 01/23/20 1517    Visit Number  38    Date for PT Re-Evaluation  01/27/20    PT Start Time  1435    PT Stop Time  1515    PT Time Calculation (min)  40 min    Activity Tolerance  Patient tolerated treatment well    Behavior During Therapy  [facility] for tasks assessed/performed       Past Medical History:  Diagnosis Date  . Anxiety   . Arthritis    knees  . Breast cancer ([place]) 1999  . CHF (congestive heart failure) ([place])    Related to severe mitral regurgitation, April, 2013  . 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 Assess by pulmonary team in the hospital April, 2013  . Ejection fraction    EF 60%, echo, April, 2013, with severe MR before mitral valve replacement  . Herpes   . Hypothyroidism   . IBS (irritable bowel syndrome)   . Mitral valve regurgitation    Mitral valve replacement April, 2013, Mitral valve prolapse  . Multiple sclerosis ([place])   . Neurogenic bladder   . Osteoporosis   . Ovarian cyst   . Paroxysmal atrial fibrillation (HCC)    Rapid atrial fibrillation in-hospital, Rapid cardioversion,  before mitral valve surgery  . Pulmonary hypertension ([place])    Echo, April, 2013, before mitral valve surgery  . S/P Maze operation for atrial fibrillation 01/26/2012   Complete biatrial lesion set using cryothermy via right mini thoracotomy  . S/P mitral valve replacement 01/26/2012   66m Sorin [employer] Optiform mechanical prosthesis via right mini thoracotomy  . Warfarin anticoagulation    Mechanical mitral prosthesis, April, 20136    Past Surgical History:  Procedure Laterality  Date  . BREAST LUMPECTOMY Right 1999   with sent.node, and axillary dissection (20)  . CHEST TUBE INSERTION  01/26/2012   Procedure: CHEST TUBE INSERTION;  Surgeon: CRexene Alberts MD;  [place]  Service: Open Heart Surgery;  Laterality: Left;  . COLONOSCOPY  2010   "normal"  . CYSTOSCOPY  1992  . LAPAROSCOPIC OVARIAN CYSTECTOMY  1978   urethral stricture repair  . LEFT AND RIGHT HEART CATHETERIZATION WITH CORONARY ANGIOGRAM N/A 01/20/2012   Procedure: LEFT AND RIGHT HEART CATHETERIZATION WITH CORONARY ANGIOGRAM;  Surgeon: CBurnell Blanks MD;  [facility];  Service: Cardiovascular;  Laterality: N/A;  . LYMPHADENECTOMY    . MAZE  01/26/2012   Procedure: MAZE;  Surgeon: CRexene Alberts MD;  [place]  Service: Open Heart Surgery;  Laterality: N/A;  . MITRAL VALVE REPLACEMENT  01/26/2012   Procedure: MINIMALLY INVASIVE MITRAL VALVE (MV) REPLACEMENT;  Surgeon: CRexene Alberts MD;  [place]  Service: Open Heart Surgery;  Laterality: Right;  . TEE WITHOUT CARDIOVERSION  01/19/2012   Procedure: TRANSESOPHAGEAL ECHOCARDIOGRAM (TEE);  Surgeon: Peter M JMartinique MD;  [facility];  Service: Cardiovascular;  Laterality: N/A;  . TONSILLECTOMY  1970  . [place] . WRIST SURGERY Right 2012    There were no vitals filed for this visit.  Subjective Assessment - 01/23/20 1439  Subjective  Pt reports that the weekend has required her to walk more with walker. L groin muscle did tighten up on her this morning walking around her house without AD    Currently in Pain?  No/denies                       [facility] Adult PT Treatment/Exercise - 01/23/20 0001      Lumbar Exercises: Aerobic   Nustep  lvl 4 x 7 min      Lumbar Exercises: Machines for Strengthening   Cybex Knee Extension  10lb 2 x 10    Cybex Knee Flexion  25lb 2 x 10      Lumbar Exercises: Standing   Other Standing Lumbar Exercises  4" step toe touches with  light CGA no AD      Lumbar Exercises: Seated   Other Seated Lumbar Exercises  bosu behind partial sit ups               PT Short Term Goals - 08/23/19 1541      PT SHORT TERM GOAL #2   Title  Patient to decrease 5x sit to stand time to 15 seconds or less to decrease fall risk.    Status  On-going        PT Long Term Goals - 01/11/20 1627      PT LONG TERM GOAL #2   Title  Pt will amb. 500' over even/uneven terrain with LRAD at MOD I level to improve functional mobility.    Status  Partially Met      PT LONG TERM GOAL #3   Title  Pt will amb. 150' with SPC at MOD I level (indoors) in order to safely amb. at home.     Status  Partially Met            Plan - 01/23/20 1531    Clinical Impression Statement  Pt was very fatigue today, Increase time needed to complete the interventions. Ambulated throughout session without AD. Tactile cues needed with straight arm pull downs. Reports having to walk a lot yesterday to receive covid shot.    Comorbidities  COPD, HTN, Osteoporosis, mitral valve replacement 2018, breast CA, arthritis bil knees    Examination-Activity Limitations  Locomotion Level;Transfers;Stairs;Stand    Examination-Participation Restrictions  Community Activity    Stability/Clinical Decision Making  Evolving/Moderate complexity    Rehab Potential  Good    PT Frequency  2x / week    PT Duration  4 weeks    PT Treatment/Interventions  ADLs/Self Care Home Management;Gait training;Stair training;Therapeutic activities;Therapeutic exercise;Balance training;Neuromuscular re-education;Patient/family education;Manual techniques    PT Next Visit Plan  work on cores, function and balance       Patient will benefit from skilled therapeutic intervention in order to improve the following deficits and impairments:  Decreased mobility, Postural dysfunction, Decreased strength, Impaired flexibility, Difficulty walking, Decreased balance  Visit Diagnosis: Other  abnormalities of gait and mobility  Muscle weakness (generalized)     Problem List Patient Active Problem List   Diagnosis Date Noted  . Osteoporosis 07/05/2019  . Nonischemic cardiomyopathy ([place]) 07/20/2018  . Chest pain 07/20/2018  . Scoliosis 11/20/2016  . Long term current use of anticoagulant 09/18/2016  . Compression fracture of lumbar spine, non-traumatic, sequela 08/11/2016  . Multiple falls 08/11/2016  . At high risk for injury related to fall 08/11/2016  . Encounter for therapeutic drug monitoring 11/27/2013  . Hypothyroidism   . CHF (  congestive heart failure) ([place])   . Paroxysmal atrial fibrillation (HCC)   . Arthritis   . Neurogenic bladder   . Warfarin anticoagulation   . First degree heart block 01/29/2012  . 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 S/P Maze operation for atrial fibrillation 01/26/2012  . Anxiety 01/18/2012  . Multiple sclerosis ([place]) 01/19/2007    Scot Jun, PTA 01/23/2020, 3:34 PM  [place] [address] [place], [place], [address] Phone: [phone]   Fax:  [phone]  Name: [name] [name] MRN: [id number] Date of Birth: 05/28/1951

## 2020-01-24 DIAGNOSIS — E039 Hypothyroidism, unspecified: Secondary | ICD-10-CM | POA: Diagnosis not present

## 2020-01-25 ENCOUNTER — Ambulatory Visit: Payer: Medicare Other | Admitting: Physical Therapy

## 2020-01-25 ENCOUNTER — Encounter: Payer: Self-pay | Admitting: Physical Therapy

## 2020-01-25 ENCOUNTER — Other Ambulatory Visit: Payer: Self-pay

## 2020-01-25 DIAGNOSIS — M6281 Muscle weakness (generalized): Secondary | ICD-10-CM

## 2020-01-25 DIAGNOSIS — R2689 Other abnormalities of gait and mobility: Secondary | ICD-10-CM

## 2020-01-25 DIAGNOSIS — R2681 Unsteadiness on feet: Secondary | ICD-10-CM

## 2020-01-25 NOTE — Therapy (Signed)
[place], [place], [address] Phone: [phone]   Fax:  [phone]  Physical Therapy Treatment  Patient Details  Name: [name] [name] MRN: [id number] Date of Birth: 1951/07/14 Referring Provider (PT): Howard Pouch   Encounter Date: 01/25/2020  PT End of Session - 01/25/20 1612    Visit Number  4    Date for PT Re-Evaluation  02/01/20    PT Start Time  1530    PT Stop Time  1620    PT Time Calculation (min)  50 min    Activity Tolerance  Patient tolerated treatment well    Behavior During Therapy  [facility] for tasks assessed/performed       Past Medical History:  Diagnosis Date  . Anxiety   . Arthritis    knees  . Breast cancer ([place]) 1999  . CHF (congestive heart failure) ([place])    Related to severe mitral regurgitation, April, 2013  . 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 Assess by pulmonary team in the hospital April, 2013  . Ejection fraction    EF 60%, echo, April, 2013, with severe MR before mitral valve replacement  . Herpes   . Hypothyroidism   . IBS (irritable bowel syndrome)   . Mitral valve regurgitation    Mitral valve replacement April, 2013, Mitral valve prolapse  . Multiple sclerosis ([place])   . Neurogenic bladder   . Osteoporosis   . Ovarian cyst   . Paroxysmal atrial fibrillation (HCC)    Rapid atrial fibrillation in-hospital, Rapid cardioversion,  before mitral valve surgery  . Pulmonary hypertension ([place])    Echo, April, 2013, before mitral valve surgery  . S/P Maze operation for atrial fibrillation 01/26/2012   Complete biatrial lesion set using cryothermy via right mini thoracotomy  . S/P mitral valve replacement 01/26/2012   44m Sorin [employer] Optiform mechanical prosthesis via right mini thoracotomy  . Warfarin anticoagulation    Mechanical mitral prosthesis, April, 20136    Past Surgical History:  Procedure Laterality  Date  . BREAST LUMPECTOMY Right 1999   with sent.node, and axillary dissection (20)  . CHEST TUBE INSERTION  01/26/2012   Procedure: CHEST TUBE INSERTION;  Surgeon: CRexene Alberts MD;  [place]: [facility]  Service: Open Heart Surgery;  Laterality: Left;  . COLONOSCOPY  2010   "normal"  . CYSTOSCOPY  1992  . LAPAROSCOPIC OVARIAN CYSTECTOMY  1978   urethral stricture repair  . LEFT AND RIGHT HEART CATHETERIZATION WITH CORONARY ANGIOGRAM N/A 01/20/2012   Procedure: LEFT AND RIGHT HEART CATHETERIZATION WITH CORONARY ANGIOGRAM;  Surgeon: CBurnell Blanks MD;  [facility];  Service: Cardiovascular;  Laterality: N/A;  . LYMPHADENECTOMY    . MAZE  01/26/2012   Procedure: MAZE;  Surgeon: CRexene Alberts MD;  [place] [facility]  Service: Open Heart Surgery;  Laterality: N/A;  . MITRAL VALVE REPLACEMENT  01/26/2012   Procedure: MINIMALLY INVASIVE MITRAL VALVE (MV) REPLACEMENT;  Surgeon: CRexene Alberts MD;  [facility] [facility]  Service: Open Heart Surgery;  Laterality: Right;  . TEE WITHOUT CARDIOVERSION  01/19/2012   Procedure: TRANSESOPHAGEAL ECHOCARDIOGRAM (TEE);  Surgeon: Peter M JMartinique MD;  [facility];  Service: Cardiovascular;  Laterality: N/A;  . TONSILLECTOMY  1970  . [place] . WRIST SURGERY Right 2012    There were no vitals filed for this visit.  Subjective Assessment - 01/25/20 1537  Subjective  reports that earlier in the week she had her left groin mm go into spasms, reports difficulty walking since    Currently in Pain?  Yes    Pain Score  2     Pain Location  Groin    Pain Orientation  Left    Aggravating Factors   unsure why                       [facility] Adult PT Treatment/Exercise - 01/25/20 0001      High Level Balance   High Level Balance Comments  seated balance ball catch and toss      Lumbar Exercises: Stretches   Other Lumbar Stretch Exercise  groin stretches      Lumbar Exercises: Aerobic   Nustep  lvl 4 x  7 min      Lumbar Exercises: Machines for Strengthening   Cybex Knee Extension  5lb 2 x 10    Cybex Knee Flexion  25lb 2 x 10    Other Lumbar Machine Exercise  5# straight arm pulls cues for core and posture      Lumbar Exercises: Seated   Other Seated Lumbar Exercises  bosu behind partial sit ups, with oblique crunsches added today               PT Short Term Goals - 08/23/19 1541      PT SHORT TERM GOAL #2   Title  Patient to decrease 5x sit to stand time to 15 seconds or less to decrease fall risk.    Status  On-going        PT Long Term Goals - 01/11/20 1627      PT LONG TERM GOAL #2   Title  Pt will amb. 500' over even/uneven terrain with LRAD at MOD I level to improve functional mobility.    Status  Partially Met      PT LONG TERM GOAL #3   Title  Pt will amb. 150' with SPC at MOD I level (indoors) in order to safely amb. at home.     Status  Partially Met            Plan - 01/25/20 1616    Clinical Impression Statement  Patient is doing well, has had a little set back with the new left groin pain, gave her some stretches to do at home.  She felt like that was good for her.  She continues to have some issues with shuffling pattern gait that I worry about falls, she also tends to creep along the furniture.  I spoke with her about D/C she agrees and would like to    PT Next Visit Plan  work on HEP and what she can do without PT    Consulted and Agree with Plan of Care  Patient       Patient will benefit from skilled therapeutic intervention in order to improve the following deficits and impairments:  Decreased mobility, Postural dysfunction, Decreased strength, Impaired flexibility, Difficulty walking, Decreased balance  Visit Diagnosis: Other abnormalities of gait and mobility  Muscle weakness (generalized)  Unsteadiness on feet     Problem List Patient Active Problem List   Diagnosis Date Noted  . Osteoporosis 07/05/2019  . Nonischemic  cardiomyopathy ([place]) 07/20/2018  . Chest pain 07/20/2018  . Scoliosis 11/20/2016  . Long term current use of anticoagulant 09/18/2016  . Compression fracture of lumbar spine, non-traumatic, sequela 08/11/2016  . Multiple falls  08/11/2016  . At high risk for injury related to fall 08/11/2016  . Encounter for therapeutic drug monitoring 11/27/2013  . Hypothyroidism   . CHF (congestive heart failure) ([place])   . Paroxysmal atrial fibrillation (HCC)   . Arthritis   . Neurogenic bladder   . Warfarin anticoagulation   . First degree heart block 01/29/2012  . 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 S/P Maze operation for atrial fibrillation 01/26/2012  . Anxiety 01/18/2012  . Multiple sclerosis ([place]) 01/19/2007    Sumner Boast., PT 01/25/2020, 5:02 PM  [address] [place], [place], [address] Phone: [phone]   Fax:  [phone]  Name: [name] [name] MRN: [id number] Date of Birth: 1950-12-11

## 2020-01-29 ENCOUNTER — Encounter: Payer: Self-pay | Admitting: Physical Therapy

## 2020-01-29 ENCOUNTER — Ambulatory Visit: Payer: Medicare Other | Admitting: Physical Therapy

## 2020-01-29 ENCOUNTER — Other Ambulatory Visit: Payer: Self-pay

## 2020-01-29 DIAGNOSIS — M6281 Muscle weakness (generalized): Secondary | ICD-10-CM | POA: Diagnosis not present

## 2020-01-29 DIAGNOSIS — R2689 Other abnormalities of gait and mobility: Secondary | ICD-10-CM

## 2020-01-29 DIAGNOSIS — R2681 Unsteadiness on feet: Secondary | ICD-10-CM

## 2020-01-29 NOTE — Therapy (Signed)
[place], [place], [address] Phone: [phone]   Fax:  [phone]  Physical Therapy Treatment  Patient Details  Name: [name] [name] MRN: [id number] Date of Birth: June 29, 1951 Referring Provider (PT): Howard Pouch   Encounter Date: 01/29/2020  PT End of Session - 01/29/20 1511    Visit Number  40    Date for PT Re-Evaluation  02/01/20    PT Start Time  1430    PT Stop Time  1515    PT Time Calculation (min)  45 min    Activity Tolerance  Patient tolerated treatment well       Past Medical History:  Diagnosis Date  . Anxiety   . Arthritis    knees  . Breast cancer ([place]) 1999  . CHF (congestive heart failure) ([place])    Related to severe mitral regurgitation, April, 2013  . 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 Assess by pulmonary team in the hospital April, 2013  . Ejection fraction    EF 60%, echo, April, 2013, with severe MR before mitral valve replacement  . Herpes   . Hypothyroidism   . IBS (irritable bowel syndrome)   . Mitral valve regurgitation    Mitral valve replacement April, 2013, Mitral valve prolapse  . Multiple sclerosis ([place])   . Neurogenic bladder   . Osteoporosis   . Ovarian cyst   . Paroxysmal atrial fibrillation (HCC)    Rapid atrial fibrillation in-hospital, Rapid cardioversion,  before mitral valve surgery  . Pulmonary hypertension ([place])    Echo, April, 2013, before mitral valve surgery  . S/P Maze operation for atrial fibrillation 01/26/2012   Complete biatrial lesion set using cryothermy via right mini thoracotomy  . S/P mitral valve replacement 01/26/2012   23m Sorin [employer] Optiform mechanical prosthesis via right mini thoracotomy  . Warfarin anticoagulation    Mechanical mitral prosthesis, April, 20136    Past Surgical History:  Procedure Laterality Date  . BREAST LUMPECTOMY Right 1999   with sent.node, and  axillary dissection (20)  . CHEST TUBE INSERTION  01/26/2012   Procedure: CHEST TUBE INSERTION;  Surgeon: CRexene Alberts MD;  [place] [facility]  Service: Open Heart Surgery;  Laterality: Left;  . COLONOSCOPY  2010   "normal"  . CYSTOSCOPY  1992  . LAPAROSCOPIC OVARIAN CYSTECTOMY  1978   urethral stricture repair  . LEFT AND RIGHT HEART CATHETERIZATION WITH CORONARY ANGIOGRAM N/A 01/20/2012   Procedure: LEFT AND RIGHT HEART CATHETERIZATION WITH CORONARY ANGIOGRAM;  Surgeon: CBurnell Blanks MD;  [facility];  Service: Cardiovascular;  Laterality: N/A;  . LYMPHADENECTOMY    . MAZE  01/26/2012   Procedure: MAZE;  Surgeon: CRexene Alberts MD;  [place] [facility] [place]  Service: Open Heart Surgery;  Laterality: N/A;  . MITRAL VALVE REPLACEMENT  01/26/2012   Procedure: MINIMALLY INVASIVE MITRAL VALVE (MV) REPLACEMENT;  Surgeon: CRexene Alberts MD;  [place] [facility]  Service: Open Heart Surgery;  Laterality: Right;  . TEE WITHOUT CARDIOVERSION  01/19/2012   Procedure: TRANSESOPHAGEAL ECHOCARDIOGRAM (TEE);  Surgeon: Peter M JMartinique MD;  [facility];  Service: Cardiovascular;  Laterality: N/A;  . TONSILLECTOMY  1970  . [place] . WRIST SURGERY Right 2012    There were no vitals filed for this visit.  Subjective Assessment - 01/29/20 1434    Subjective  Pt reports that she woke up  started walking and L ankle buckled causing pain. No fall    Currently in Pain?  Yes    Pain Score  1     Pain Location  Ankle    Pain Orientation  Left                       [facility] Adult PT Treatment/Exercise - 01/29/20 0001      High Level Balance   High Level Balance Comments  Standing ball toss some catches and taps LOB x 3       Lumbar Exercises: Aerobic   Nustep  lvl 4 x 7 min      Lumbar Exercises: Machines for Strengthening   Cybex Knee Extension  5lb 2 x 10    Cybex Knee Flexion  25lb  3 x 10      Lumbar Exercises: Standing   Row  Theraband;20  reps;Both    Theraband Level (Row)  Level 3 (Green)    Shoulder Extension  Theraband;Strengthening;20 reps    Theraband Level (Shoulder Extension)  Level 3 (Green)    Other Standing Lumbar Exercises  4" step toe touches with light CGA no AD    Other Standing Lumbar Exercises  Triceps ext 20lb 2x10, stretching from pull up bar      Lumbar Exercises: Seated   Other Seated Lumbar Exercises  bosu behind partial sit ups, with oblique crunsches added today               PT Short Term Goals - 08/23/19 1541      PT SHORT TERM GOAL #2   Title  Patient to decrease 5x sit to stand time to 15 seconds or less to decrease fall risk.    Status  On-going        PT Long Term Goals - 01/11/20 1627      PT LONG TERM GOAL #2   Title  Pt will amb. 500' over even/uneven terrain with LRAD at MOD I level to improve functional mobility.    Status  Partially Met      PT LONG TERM GOAL #3   Title  Pt will amb. 150' with SPC at MOD I level (indoors) in order to safely amb. at home.     Status  Partially Met            Plan - 01/29/20 1512    Clinical Impression Statement  increase time needed ambulating around clinic without AD. She did report some L ankle pain with standing alt box taps.  Some LOB with standing ball toss requiring min to mod assist to correct, pt also with decrease righting reaction.    Personal Factors and Comorbidities  Age;Fitness;Comorbidity 3+    Comorbidities  COPD, HTN, Osteoporosis, mitral valve replacement 2018, breast CA, arthritis bil knees    Examination-Activity Limitations  Locomotion Level;Transfers;Stairs;Stand    Examination-Participation Restrictions  Community Activity    Stability/Clinical Decision Making  Evolving/Moderate complexity    Rehab Potential  Good    PT Frequency  2x / week    PT Next Visit Plan  work on HEP and what she can do without PT       Patient will benefit from skilled therapeutic intervention in order to improve the following  deficits and impairments:  Decreased mobility, Postural dysfunction, Decreased strength, Impaired flexibility, Difficulty walking, Decreased balance  Visit Diagnosis: Muscle weakness (generalized)  Unsteadiness on feet  Other abnormalities of gait and mobility  Problem List Patient Active Problem List   Diagnosis Date Noted  . Osteoporosis 07/05/2019  . Nonischemic cardiomyopathy ([place]) 07/20/2018  . Chest pain 07/20/2018  . Scoliosis 11/20/2016  . Long term current use of anticoagulant 09/18/2016  . Compression fracture of lumbar spine, non-traumatic, sequela 08/11/2016  . Multiple falls 08/11/2016  . At high risk for injury related to fall 08/11/2016  . Encounter for therapeutic drug monitoring 11/27/2013  . Hypothyroidism   . CHF (congestive heart failure) ([place])   . Paroxysmal atrial fibrillation (HCC)   . Arthritis   . Neurogenic bladder   . Warfarin anticoagulation   . First degree heart block 01/29/2012  . 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 S/P Maze operation for atrial fibrillation 01/26/2012  . Anxiety 01/18/2012  . Multiple sclerosis ([place]) 01/19/2007    Scot Jun, PTA 01/29/2020, 3:33 PM  [place] [address] [place], [place], [address] Phone: [phone]   Fax:  [phone]  Name: [name] [name] MRN: [id number] Date of Birth: May 15, 1951

## 2020-02-01 ENCOUNTER — Other Ambulatory Visit: Payer: Self-pay

## 2020-02-01 ENCOUNTER — Encounter: Payer: Self-pay | Admitting: Physical Therapy

## 2020-02-01 ENCOUNTER — Ambulatory Visit: Payer: Medicare Other | Admitting: Physical Therapy

## 2020-02-01 DIAGNOSIS — R2689 Other abnormalities of gait and mobility: Secondary | ICD-10-CM

## 2020-02-01 DIAGNOSIS — R2681 Unsteadiness on feet: Secondary | ICD-10-CM

## 2020-02-01 DIAGNOSIS — M6281 Muscle weakness (generalized): Secondary | ICD-10-CM | POA: Diagnosis not present

## 2020-02-01 NOTE — Therapy (Signed)
[place], [place], [address] Phone: [phone]   Fax:  [phone]  Physical Therapy Treatment  Patient Details  Name: [name] [name] MRN: [id number] Date of Birth: 08-Sep-1951 Referring Provider (PT): Howard Pouch   Encounter Date: 02/01/2020  PT End of Session - 02/01/20 1618    Visit Number  56    PT Start Time  [id number]    PT Stop Time  [id number]    PT Time Calculation (min)  44 min    Activity Tolerance  Patient tolerated treatment well    Behavior During Therapy  [facility] for tasks assessed/performed       Past Medical History:  Diagnosis Date  . Anxiety   . Arthritis    knees  . Breast cancer ([place]) 1999  . CHF (congestive heart failure) ([place])    Related to severe mitral regurgitation, April, 2013  . 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 Assess by pulmonary team in the hospital April, 2013  . Ejection fraction    EF 60%, echo, April, 2013, with severe MR before mitral valve replacement  . Herpes   . Hypothyroidism   . IBS (irritable bowel syndrome)   . Mitral valve regurgitation    Mitral valve replacement April, 2013, Mitral valve prolapse  . Multiple sclerosis ([place])   . Neurogenic bladder   . Osteoporosis   . Ovarian cyst   . Paroxysmal atrial fibrillation (HCC)    Rapid atrial fibrillation in-hospital, Rapid cardioversion,  before mitral valve surgery  . Pulmonary hypertension ([place])    Echo, April, 2013, before mitral valve surgery  . S/P Maze operation for atrial fibrillation 01/26/2012   Complete biatrial lesion set using cryothermy via right mini thoracotomy  . S/P mitral valve replacement 01/26/2012   72mm Sorin [employer] Optiform mechanical prosthesis via right mini thoracotomy  . Warfarin anticoagulation    Mechanical mitral prosthesis, April, 20136    Past Surgical History:  Procedure Laterality Date  . BREAST LUMPECTOMY Right 1999   with sent.node, and axillary dissection (20)  . CHEST TUBE INSERTION  01/26/2012   Procedure: CHEST TUBE INSERTION;  Surgeon: Rexene Alberts, MD;  Location: [place];  Service: Open Heart Surgery;  Laterality: Left;  . COLONOSCOPY  2010   "normal"  . CYSTOSCOPY  1992  . LAPAROSCOPIC OVARIAN CYSTECTOMY  1978   urethral stricture repair  . LEFT AND RIGHT HEART CATHETERIZATION WITH CORONARY ANGIOGRAM N/A 01/20/2012   Procedure: LEFT AND RIGHT HEART CATHETERIZATION WITH CORONARY ANGIOGRAM;  Surgeon: Burnell Blanks, MD;  [facility];  Service: Cardiovascular;  Laterality: N/A;  . LYMPHADENECTOMY    . MAZE  01/26/2012   Procedure: MAZE;  Surgeon: Rexene Alberts, MD;  Location: [place];  Service: Open Heart Surgery;  Laterality: N/A;  . MITRAL VALVE REPLACEMENT  01/26/2012   Procedure: MINIMALLY INVASIVE MITRAL VALVE (MV) REPLACEMENT;  Surgeon: Rexene Alberts, MD;  Location: [place];  Service: Open Heart Surgery;  Laterality: Right;  . TEE WITHOUT CARDIOVERSION  01/19/2012   Procedure: TRANSESOPHAGEAL ECHOCARDIOGRAM (TEE);  Surgeon: Peter M Martinique, MD;  [facility];  Service: Cardiovascular;  Laterality: N/A;  . TONSILLECTOMY  1970  . [place]  . WRIST SURGERY Right 2012    There were no vitals filed for this visit.  Subjective Assessment - 02/01/20 1538    Subjective  Patient reports that she is  feeling good but has questions on how to stay active    Currently in Pain?  No/denies                       [facility] Adult PT Treatment/Exercise - 02/01/20 0001      High Level Balance   High Level Balance Activities  Side stepping    High Level Balance Comments  seated ball and standing ball hitting back, working on posture, trunk stability and balance      Lumbar Exercises: Machines for Strengthening   Other Lumbar Machine Exercise  5# straight arm pulls cues for core and posture, 5# rows    Other Lumbar Machine Exercise  20# triceps 2x10,  biceps 5# 2x10      Lumbar Exercises: Standing   Other Standing Lumbar Exercises  4" step toe touches with light CGA no AD      Lumbar Exercises: Seated   Other Seated Lumbar Exercises  bosu behind partial sit ups, with oblique crunsches added today    Other Seated Lumbar Exercises  passive thoracic extension and chest opening, then weighted ball over head reach and look, then left hip to right overhead shoulder and vice versa elbow to mat on right and left               PT Short Term Goals - 08/23/19 1541      PT SHORT TERM GOAL #2   Title  Patient to decrease 5x sit to stand time to 15 seconds or less to decrease fall risk.    Status  On-going        PT Long Term Goals - 02/01/20 1620      PT LONG TERM GOAL #1   Title  Independent with HEP for strengthening and flexibility to increase function.    Status  Achieved      PT LONG TERM GOAL #2   Title  Pt will amb. 500' over even/uneven terrain with LRAD at MOD I level to improve functional mobility.    Status  Achieved      PT LONG TERM GOAL #3   Title  Pt will amb. 150' with SPC at MOD I level (indoors) in order to safely amb. at home.     Status  Achieved      PT LONG TERM GOAL #4   Title  Patient able to climb stairs with a reciprocal gait pattern and BUE support    Status  Achieved      PT LONG TERM GOAL #5   Title  Patient will demostrate reduced risk of falling reflected by incr Berg Balance Assessment to 39/56    Status  Achieved            Plan - 02/01/20 1619    Clinical Impression Statement  I spent time with her today about how to keep active with HEP, possibly going to a gym, talked with her about remembering the cues to stand up straight and to take big steps.  She feels like she knows what to do, reports some times motivation is an issue.  Tends to shuffle with transfers but with cues to do better       Patient will benefit from skilled therapeutic intervention in order to improve the  following deficits and impairments:  Decreased mobility, Postural dysfunction, Decreased strength, Impaired flexibility, Difficulty walking, Decreased balance  Visit Diagnosis: Muscle weakness (generalized)  Unsteadiness on feet  Other abnormalities of gait and mobility  Problem List Patient Active Problem List   Diagnosis Date Noted  . Osteoporosis 07/05/2019  . Nonischemic cardiomyopathy ([place]) 07/20/2018  . Chest pain 07/20/2018  . Scoliosis 11/20/2016  . Long term current use of anticoagulant 09/18/2016  . Compression fracture of lumbar spine, non-traumatic, sequela 08/11/2016  . Multiple falls 08/11/2016  . At high risk for injury related to fall 08/11/2016  . Encounter for therapeutic drug monitoring 11/27/2013  . Hypothyroidism   . CHF (congestive heart failure) ([place])   . Paroxysmal atrial fibrillation (HCC)   . Arthritis   . Neurogenic bladder   . Warfarin anticoagulation   . First degree heart block 01/29/2012  . 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 S/P Maze operation for atrial fibrillation 01/26/2012  . Anxiety 01/18/2012  . Multiple sclerosis ([place]) 01/19/2007    Sumner Boast., PT 02/01/2020, 4:21 PM  [place] [address] [place], [place], [address] Phone: [phone]   Fax:  [phone]  Name: [name] [name] MRN: [id number] Date of Birth: 1951/03/24

## 2020-02-05 ENCOUNTER — Other Ambulatory Visit: Payer: Self-pay

## 2020-02-05 ENCOUNTER — Ambulatory Visit (INDEPENDENT_AMBULATORY_CARE_PROVIDER_SITE_OTHER): Payer: Medicare Other | Admitting: *Deleted

## 2020-02-05 DIAGNOSIS — Z9889 Other specified postprocedural states: Secondary | ICD-10-CM

## 2020-02-05 DIAGNOSIS — Z8679 Personal history of other diseases of the circulatory system: Secondary | ICD-10-CM | POA: Diagnosis not present

## 2020-02-05 DIAGNOSIS — Z952 Presence of prosthetic heart valve: Secondary | ICD-10-CM

## 2020-02-05 DIAGNOSIS — Z5181 Encounter for therapeutic drug level monitoring: Secondary | ICD-10-CM

## 2020-02-05 LAB — POCT INR: INR: 2.9 (ref 2.0–3.0)

## 2020-02-05 NOTE — Patient Instructions (Addendum)
Description   Continue taking 1 tablet everyday. Recheck in 5 weeks. (usually 6 weeks, 5 for scheduling purposes). Call with any new medications or procedures [phone]

## 2020-02-08 ENCOUNTER — Telehealth: Payer: Self-pay | Admitting: Cardiology

## 2020-02-08 MED ORDER — LISINOPRIL 5 MG PO TABS: 5.0000 mg | ORAL_TABLET | Freq: Every day | ORAL | 2 refills | Status: DC

## 2020-02-08 NOTE — Telephone Encounter (Signed)
Pt calling in to request a refill of her lisinopril. Pt is to take a total of lisinopril 5 mg po daily.  A few months ago we had to send in lisinopril 2.5 mg take 2 tablets (5 mg total) by mouth daily to her pharmacy, for they were out of stock of the 5 mg tablets. Pt states now they have the 5 mg tablets of lisinopril in stock, and is requesting that Dr. Meda Coffee send in that refill for lisinopril 5 mg po daily, for she states the 2.5 mg tablets are way too small, and easy to drop and lose.  Confirmed the pharmacy of choice with the pt.  Sent in requested refill of lisinopril 5 mg po daily.  Pt verbalized understanding and agrees with this plan.  Pt was more than gracious for all the assistance provided.

## 2020-02-08 NOTE — Telephone Encounter (Signed)
Pt c/o medication issue:  1. Name of Medication:   lisinopril (ZESTRIL) 2.5 MG tablet   2. How are you currently taking this medication (dosage and times per day)? 2 tablets by mouth daily   3. Are you having a reaction (difficulty breathing--STAT)? No  4. What is your medication issue? Trilba is calling requesting Dr. Meda Coffee write her a new prescription for 5 MG's instead of 2.5 MG's and send it in to her pharmacy for a refill. She states the pills are to small and she fears she is going to lose them. Please advise.

## 2020-02-15 DIAGNOSIS — E039 Hypothyroidism, unspecified: Secondary | ICD-10-CM | POA: Diagnosis not present

## 2020-02-18 DIAGNOSIS — E039 Hypothyroidism, unspecified: Secondary | ICD-10-CM | POA: Diagnosis not present

## 2020-02-29 DIAGNOSIS — E039 Hypothyroidism, unspecified: Secondary | ICD-10-CM | POA: Diagnosis not present

## 2020-03-11 ENCOUNTER — Ambulatory Visit (INDEPENDENT_AMBULATORY_CARE_PROVIDER_SITE_OTHER): Payer: Medicare Other | Admitting: *Deleted

## 2020-03-11 ENCOUNTER — Other Ambulatory Visit: Payer: Self-pay

## 2020-03-11 DIAGNOSIS — Z5181 Encounter for therapeutic drug level monitoring: Secondary | ICD-10-CM

## 2020-03-11 DIAGNOSIS — Z952 Presence of prosthetic heart valve: Secondary | ICD-10-CM

## 2020-03-11 DIAGNOSIS — Z9889 Other specified postprocedural states: Secondary | ICD-10-CM | POA: Diagnosis not present

## 2020-03-11 DIAGNOSIS — Z8679 Personal history of other diseases of the circulatory system: Secondary | ICD-10-CM

## 2020-03-11 LAB — POCT INR: INR: 3 (ref 2.0–3.0)

## 2020-03-11 NOTE — Patient Instructions (Addendum)
  Description   Continue taking 1 tablet everyday. Recheck in 7 weeks. Call with any new medications or procedures [phone]

## 2020-03-28 ENCOUNTER — Telehealth: Payer: Self-pay | Admitting: Cardiology

## 2020-03-28 NOTE — Telephone Encounter (Signed)
New Message  Pt c/o medication issue:  1. Name of Medication: metoprolol tartrate (LOPRESSOR) 25 MG tablet  2. How are you currently taking this medication (dosage and times per day)? As written  3. Are you having a reaction (difficulty breathing--STAT)? No  4. What is your medication issue? Pt needs a new prescription filled by Dr. Meda Coffee

## 2020-03-28 NOTE — Telephone Encounter (Signed)
Spoke with the pt. She is calling in asking for refills of her metoprolol. Pt was due to see Dr. Meda Coffee back in July 2020.  Advised the pt that she will need to schedule an appt with our office, for she is overdue.  Informed the pt that this is for continuity of care and to make sure she is doing good from a cardiac perspective, as well as for medication management.  Offered the pt to come in and see Dr. Meda Coffee for tomorrow 6/11 at 0800.  Arrive 15 mins prior to that appt.  Pt agreed to come in and see Dr. Meda Coffee on 03/29/20 at 0800.  Pt states she has enough metoprolol to last her until this appt.  Pt states she needed to discuss this med with her 43, for she feels fatigued while taking this, and may need to be switched to a different regimen. Pt verbalized understanding and agrees with this plan.

## 2020-03-29 ENCOUNTER — Ambulatory Visit (INDEPENDENT_AMBULATORY_CARE_PROVIDER_SITE_OTHER): Payer: Medicare Other | Admitting: Cardiology

## 2020-03-29 ENCOUNTER — Other Ambulatory Visit: Payer: Self-pay

## 2020-03-29 ENCOUNTER — Encounter: Payer: Self-pay | Admitting: Cardiology

## 2020-03-29 VITALS — BP 138/78 | HR 60 | Ht 67.0 in | Wt 146.0 lb

## 2020-03-29 DIAGNOSIS — I509 Heart failure, unspecified: Secondary | ICD-10-CM | POA: Diagnosis not present

## 2020-03-29 DIAGNOSIS — I480 Paroxysmal atrial fibrillation: Secondary | ICD-10-CM | POA: Diagnosis not present

## 2020-03-29 DIAGNOSIS — Z952 Presence of prosthetic heart valve: Secondary | ICD-10-CM

## 2020-03-29 DIAGNOSIS — I493 Ventricular premature depolarization: Secondary | ICD-10-CM | POA: Diagnosis not present

## 2020-03-29 DIAGNOSIS — I428 Other cardiomyopathies: Secondary | ICD-10-CM | POA: Diagnosis not present

## 2020-03-29 DIAGNOSIS — Z8679 Personal history of other diseases of the circulatory system: Secondary | ICD-10-CM

## 2020-03-29 DIAGNOSIS — Z9889 Other specified postprocedural states: Secondary | ICD-10-CM | POA: Diagnosis not present

## 2020-03-29 MED ORDER — METOPROLOL TARTRATE 25 MG PO TABS: ORAL_TABLET | ORAL | 3 refills | Status: DC

## 2020-03-29 NOTE — Patient Instructions (Signed)
Medication Instructions:   Your physician recommends that you continue on your current medications as directed. Please refer to the Current Medication list given to you today.  *If you need a refill on your cardiac medications before your next appointment, please call your pharmacy*   Testing/Procedures:  Your physician has requested that you have an echocardiogram. Echocardiography is a painless test that uses sound waves to create images of your heart. It provides your doctor with information about the size and shape of your heart and how well your heart's chambers and valves are working. This procedure takes approximately one hour. There are no restrictions for this procedure.   Follow-Up: At [facility], you and your health needs are our priority.  As part of our continuing mission to provide you with exceptional heart care, we have created designated Provider Care Teams.  These Care Teams include your primary Cardiologist (physician) and Advanced Practice Providers (APPs -  Physician Assistants and Nurse Practitioners) who all work together to provide you with the care you need, when you need it.  We recommend signing up for the patient portal called "MyChart".  Sign up information is provided on this After Visit Summary.  MyChart is used to connect with patients for Virtual Visits (Telemedicine).  Patients are able to view lab/test results, encounter notes, upcoming appointments, etc.  Non-urgent messages can be sent to your provider as well.   To learn more about what you can do with MyChart, go to [email or web address].    Your next appointment:   6 month(s)  The format for your next appointment:   In Person  Provider:   Katarina Nelson, MD    

## 2020-03-29 NOTE — Progress Notes (Signed)
Cardiology Office Note:    Date:  03/30/2020   ID:  [name] [name], DOB 29-Mar-1951, MRN [id number]  PCP:  Ma Hillock, DO  [place] Cardiologist:  No primary care provider on file.  [facility] Electrophysiologist:  None   Referring MD: Ma Hillock, DO   Reason for visit: 1 year follow up  History of Present Illness:    [name] [name] is a 69 y.o. female with a hx of non-ischemic cardiomyopathy (echo in 2019 showed LVEF 45-50%), paroxysmal atrial fibrillation, frequent PVCs followed by Dr Lovena Le, h/o severe mitral regurgitation, s/p MVR with a mechanical valve and maze in 2019 (60mm Sorin Carbomedics Optiform Mechanical Prosthesis).  She denies any bleeding, she doesn't tolerate lisinopril well. Denies significant palpitations, no orthostatic hypotension. Walks with a walker because of significant arthritis. No recent falls. No chest or SOB, LE edema, orthopnea.  Past Medical History:  Diagnosis Date  . Anxiety   . Arthritis    knees  . Breast cancer ([place]) 1999  . CHF (congestive heart failure) ([place])    Related to severe mitral regurgitation, April, 2013  . 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 Assess by pulmonary team in the hospital April, 2013  . Ejection fraction    EF 60%, echo, April, 2013, with severe MR before mitral valve replacement  . Herpes   . Hypothyroidism   . IBS (irritable bowel syndrome)   . Mitral valve regurgitation    Mitral valve replacement April, 2013, Mitral valve prolapse  . Multiple sclerosis ([place])   . Neurogenic bladder   . Osteoporosis   . Ovarian cyst   . Paroxysmal atrial fibrillation (HCC)    Rapid atrial fibrillation in-hospital, Rapid cardioversion,  before mitral valve surgery  . Pulmonary hypertension ([place])    Echo, April, 2013, before mitral valve surgery  . S/P Maze operation for atrial fibrillation 01/26/2012   Complete biatrial lesion set using cryothermy via right mini  thoracotomy  . S/P mitral valve replacement 01/26/2012   50mm Sorin Carbomedics Optiform mechanical prosthesis via right mini thoracotomy  . Warfarin anticoagulation    Mechanical mitral prosthesis, April, 20136    Past Surgical History:  Procedure Laterality Date  . BREAST LUMPECTOMY Right 1999   with sent.node, and axillary dissection (20)  . CHEST TUBE INSERTION  01/26/2012   Procedure: CHEST TUBE INSERTION;  Surgeon: Rexene Alberts, MD;  Location: [place];  Service: Open Heart Surgery;  Laterality: Left;  . COLONOSCOPY  2010   "normal"  . CYSTOSCOPY  1992  . LAPAROSCOPIC OVARIAN CYSTECTOMY  1978   urethral stricture repair  . LEFT AND RIGHT HEART CATHETERIZATION WITH CORONARY ANGIOGRAM N/A 01/20/2012   Procedure: LEFT AND RIGHT HEART CATHETERIZATION WITH CORONARY ANGIOGRAM;  Surgeon: Burnell Blanks, MD;  Location: [facility] CATH LAB;  Service: Cardiovascular;  Laterality: N/A;  . LYMPHADENECTOMY    . MAZE  01/26/2012   Procedure: MAZE;  Surgeon: Rexene Alberts, MD;  Location: [place];  Service: Open Heart Surgery;  Laterality: N/A;  . MITRAL VALVE REPLACEMENT  01/26/2012   Procedure: MINIMALLY INVASIVE MITRAL VALVE (MV) REPLACEMENT;  Surgeon: Rexene Alberts, MD;  Location: [place];  Service: Open Heart Surgery;  Laterality: Right;  . TEE WITHOUT CARDIOVERSION  01/19/2012   Procedure: TRANSESOPHAGEAL ECHOCARDIOGRAM (TEE);  Surgeon: Peter M Martinique, MD;  Location: [facility] ENDOSCOPY;  Service: Cardiovascular;  Laterality: N/A;  . TONSILLECTOMY  1970  . UMBILICAL HERNIA REPAIR  80  . WRIST SURGERY Right 2012    Current Medications: Current Meds  Medication Sig  . amoxicillin (AMOXIL) 500 MG capsule take 4 capsules by mouth 1 hour prior to dental appointment  . Biotin 5000 MCG CAPS Take 20,000 mcg by mouth.   . Black Cohosh 40 MG CAPS Take 40 mg by mouth daily.  . Bromelains (BROMELAIN PO) Take 1 capsule by mouth 2 (two) times daily.  . calcium carbonate (OSCAL) 1500 (600 Ca) MG TABS tablet Takes  1 time daily  . Cholecalciferol (VITAMIN D3) 2000 units capsule Take 6,000 Units by mouth daily.   . Coenzyme Q10 (CO Q-10) 100 MG CAPS Take 1 capsule by mouth daily.   . Cranberry 500 MG CAPS Take 1 capsule by mouth daily.  Marland Kitchen L-THEANINE PO Take 1 capsule by mouth daily.   Marland Kitchen lactose free nutrition (BOOST PLUS) LIQD Take 237 mLs by mouth daily.   Marland Kitchen lidocaine-prilocaine (EMLA) cream Apply 1 application topically as needed (per pt this is for use before blood draws).   Marland Kitchen lisinopril (ZESTRIL) 5 MG tablet Take 1 tablet (5 mg total) by mouth daily.  Marland Kitchen Lysine 500 MG CAPS Take 1 capsule by mouth daily.  . Magnesium Citrate 200 MG TABS Take 200 mg by mouth daily as needed.  . metoprolol tartrate (LOPRESSOR) 25 MG tablet TAKE 1 TAB (25 MG) PO TWICE DAILY - pt must make appt with provider for further refills - 1st attempt  . MILK THISTLE PO Take 1 capsule by mouth 2 (two) times daily.  . Misc Natural Products (GLUCOSAMINE CHOND COMPLEX/MSM PO) Strength of dose Glucosamine 1500mg  /Chodroitron 1000mg / MSM 500mg  takes one twice daily  . NON FORMULARY 1 capsule 2 (two) times daily.  Jonna Coup Leaf 500 MG CAPS Take 1 capsule by mouth 2 (two) times daily.   Marland Kitchen OVER THE COUNTER MEDICATION Red marine algae - 1 capsules daily  . Petasin (PETADOLEX PO) Take by mouth 2 (two) times daily.  . Probiotic Product (PROBIOTIC DAILY PO) Take 1 capsule once a day  . PROGESTERONE MICRONIZED PO Take 100 mg by mouth daily.   . Pumpkin Seed (URIPLEX) 500 MG TABS Take 1 tablet by mouth 2 (two) times daily.  Marland Kitchen thyroid (ARMOUR) 90 MG tablet Take 90 mg by mouth daily.  . TURMERIC PO Take by mouth.  . vitamin A 10000 UNIT capsule Take 10,000 Units by mouth daily.  . vitamin E 400 UNIT capsule Take 400 Units by mouth daily.  Marland Kitchen warfarin (COUMADIN) 5 MG tablet TAKE 1 TABLET BY MOUTH AS DIRECTED BY COUMADIN CLINIC  . [DISCONTINUED] metoprolol tartrate (LOPRESSOR) 25 MG tablet TAKE 1 TAB (25 MG) PO TWICE DAILY - pt must make appt with  provider for further refills - 1st attempt     Allergies:   Erythromycin   Social History   Socioeconomic History  . Marital status: Widowed    Spouse name: Not on file  . Number of children: 2  . Years of education: 8  . Highest education level: Not on file  Occupational History  . Occupation: Retired  Tobacco Use  . Smoking status: Never Smoker  . Smokeless tobacco: Never Used  Vaping Use  . Vaping Use: Never used  Substance and Sexual Activity  . Alcohol use: No  . Drug use: No  . Sexual activity: Never    Birth control/protection: None  Other Topics Concern  . Not on file  Social History Narrative   Patient is a  widower (since 2000), he currently lives with her son and daughter-in-law. She has 2 children.   She is college educated, and retired from the [employer].   She uses herbal remedies, and multiple over-the-counter supplements. She takes a daily vitamin.   She wears her seatbelt, exercises routinely, smoke detector in the home.   Requires a walker or wheelchair at times.   Feels safe in her relationships.   Social Determinants of Health   Financial Resource Strain:   . Difficulty of Paying Living Expenses:   Food Insecurity:   . Worried About Charity fundraiser in the Last Year:   . Arboriculturist in the Last Year:   Transportation Needs:   . Film/video editor (Medical):   Marland Kitchen Lack of Transportation (Non-Medical):   Physical Activity:   . Days of Exercise per Week:   . Minutes of Exercise per Session:   Stress:   . Feeling of Stress :   Social Connections:   . Frequency of Communication with Friends and Family:   . Frequency of Social Gatherings with Friends and Family:   . Attends Religious Services:   . Active Member of Clubs or Organizations:   . Attends Archivist Meetings:   Marland Kitchen Marital Status:      Family History: The patient's family history includes Breast cancer in her maternal aunt and maternal grandmother; CAD in her  father, mother, and another family member; Heart disease in her father; Hyperlipidemia in her mother; Hypertension in her father and mother.  ROS:   Please see the history of present illness.     All other systems reviewed and are negative.  EKGs/Labs/Other Studies Reviewed:    The following studies were reviewed today:  EKG:  EKG is ordered today.  The ekg ordered today demonstrates SR, LVH, unchanged from prior.  Recent Labs: No results found for requested labs within last 8760 hours.  Recent Lipid Panel    Component Value Date/Time   CHOL 234 (A) 08/02/2018 0000   TRIG 62 02/14/2019 0000   HDL 62 02/14/2019 0000   CHOLHDL 4 10/29/2016 1006   VLDL 31.2 10/29/2016 1006   LDLCALC 160 02/14/2019 0000    Physical Exam:    VS:  BP 138/78   Pulse 60   Ht 5\' 7"  (1.702 m)   Wt 146 lb (66.2 kg)   SpO2 95%   BMI 22.87 kg/m     Wt Readings from Last 3 Encounters:  03/29/20 146 lb (66.2 kg)  12/18/19 138 lb (62.6 kg)  03/28/19 134 lb (60.8 kg)     GEN: Well nourished, well developed in no acute distress HEENT: Normal NECK: No JVD; No carotid bruits LYMPHATICS: No lymphadenopathy CARDIAC: RRR, S2 click of mechanical valve, no significant murmurs, rubs, gallops RESPIRATORY:  Clear to auscultation without rales, wheezing or rhonchi  ABDOMEN: Soft, non-tender, non-distended MUSCULOSKELETAL:  No edema; No deformity  SKIN: Warm and dry NEUROLOGIC:  Alert and oriented x 3 PSYCHIATRIC:  Normal affect   ASSESSMENT:    1. Nonischemic cardiomyopathy ([place])   2. Congestive heart failure, unspecified HF chronicity, unspecified heart failure type (HCC)   3. Paroxysmal atrial fibrillation ([place])   4. PVCs (premature ventricular contractions)   5. S/P Maze operation for atrial fibrillation   6. S/P mitral valve replacement    PLAN:    In order of problems listed above:  1. NICMP - we will recheck LVEF and potentially switch to [facility], if  good LVEF then switch lisinopril to  valsartan. We will order an echocardiogram. 2. PVC's - not symptomatic, followed by Dr Lovena Le, recommended we have her take an extra metoprolol as needed. 3. Atrial fib - she is not having any symptomatic episodes. We will follow.  4. S/P MVR - repeat echo to evaluate gradients, normal in 2019.  We will obtain labs from her PCP.  Medication Adjustments/Labs and Tests Ordered: Current medicines are reviewed at length with the patient today.  Concerns regarding medicines are outlined above.  Orders Placed This Encounter  Procedures  . EKG 12-Lead  . ECHOCARDIOGRAM COMPLETE   Meds ordered this encounter  Medications  . metoprolol tartrate (LOPRESSOR) 25 MG tablet    Sig: TAKE 1 TAB (25 MG) PO TWICE DAILY - pt must make appt with provider for further refills - 1st attempt    Dispense:  180 tablet    Refill:  3    Patient Instructions  Medication Instructions:  Your physician recommends that you continue on your current medications as directed. Please refer to the Current Medication list given to you today.  *If you need a refill on your cardiac medications before your next appointment, please call your pharmacy*   Testing/Procedures: Your physician has requested that you have an echocardiogram. Echocardiography is a painless test that uses sound waves to create images of your heart. It provides your doctor with information about the size and shape of your heart and how well your heart's chambers and valves are working. This procedure takes approximately one hour. There are no restrictions for this procedure.     Follow-Up: At [facility], you and your health needs are our priority.  As part of our continuing mission to provide you with exceptional heart care, we have created designated Provider Care Teams.  These Care Teams include your primary Cardiologist (physician) and Advanced Practice Providers (APPs -  Physician Assistants and Nurse Practitioners) who all work together to  provide you with the care you need, when you need it.  We recommend signing up for the patient portal called "MyChart".  Sign up information is provided on this After Visit Summary.  MyChart is used to connect with patients for Virtual Visits (Telemedicine).  Patients are able to view lab/test results, encounter notes, upcoming appointments, etc.  Non-urgent messages can be sent to your provider as well.   To learn more about what you can do with MyChart, go to [email or web address].    Your next appointment:   6 month(s)  The format for your next appointment:   In Person  Provider:   Ena Dawley, MD        Signed, Ena Dawley, MD  03/30/2020 9:44 AM    [place]

## 2020-03-30 DIAGNOSIS — I493 Ventricular premature depolarization: Secondary | ICD-10-CM | POA: Insufficient documentation

## 2020-04-01 DIAGNOSIS — G35 Multiple sclerosis: Secondary | ICD-10-CM | POA: Diagnosis not present

## 2020-04-01 DIAGNOSIS — N951 Menopausal and female climacteric states: Secondary | ICD-10-CM | POA: Diagnosis not present

## 2020-04-01 DIAGNOSIS — E559 Vitamin D deficiency, unspecified: Secondary | ICD-10-CM | POA: Diagnosis not present

## 2020-04-01 DIAGNOSIS — R7989 Other specified abnormal findings of blood chemistry: Secondary | ICD-10-CM | POA: Diagnosis not present

## 2020-04-01 DIAGNOSIS — E039 Hypothyroidism, unspecified: Secondary | ICD-10-CM | POA: Diagnosis not present

## 2020-04-08 ENCOUNTER — Other Ambulatory Visit: Payer: Self-pay | Admitting: Cardiology

## 2020-04-08 DIAGNOSIS — E039 Hypothyroidism, unspecified: Secondary | ICD-10-CM | POA: Diagnosis not present

## 2020-04-09 DIAGNOSIS — E039 Hypothyroidism, unspecified: Secondary | ICD-10-CM | POA: Diagnosis not present

## 2020-04-16 ENCOUNTER — Other Ambulatory Visit (HOSPITAL_COMMUNITY): Payer: Medicare Other

## 2020-04-18 ENCOUNTER — Ambulatory Visit (HOSPITAL_COMMUNITY): Payer: Medicare Other | Attending: Cardiology

## 2020-04-18 ENCOUNTER — Other Ambulatory Visit: Payer: Self-pay

## 2020-04-18 DIAGNOSIS — I428 Other cardiomyopathies: Secondary | ICD-10-CM | POA: Diagnosis not present

## 2020-04-19 ENCOUNTER — Telehealth: Payer: Self-pay

## 2020-04-19 MED ORDER — VALSARTAN 80 MG PO TABS
80.0000 mg | ORAL_TABLET | Freq: Every day | ORAL | 3 refills | Status: DC
Start: 2020-04-19 — End: 2020-07-24

## 2020-04-19 NOTE — Telephone Encounter (Signed)
Spoke with the patient and reviewed medication changes. Patient verbalized understanding and thanked me for the call.

## 2020-04-19 NOTE — Telephone Encounter (Signed)
Left message for patient. She will discontinue lisinopril and start on valsartan 80 mg daily.

## 2020-04-19 NOTE — Telephone Encounter (Signed)
-----   Message from Dorothy Spark, MD sent at 04/19/2020 12:00 PM EDT ----- Yes, I would switch lisinopril to valsartan 80 mg po daily ----- Message ----- From: Antonieta Iba, RN Sent: 04/19/2020  11:29 AM EDT To: Dorothy Spark, MD  Patient would like to know if she can switch to another medication other than lisinopril. Per last OV note, after echocardiogram would consider switching from lisinopril to valsartan.

## 2020-04-19 NOTE — Telephone Encounter (Signed)
Patient returning call.

## 2020-04-29 ENCOUNTER — Ambulatory Visit (INDEPENDENT_AMBULATORY_CARE_PROVIDER_SITE_OTHER): Payer: Medicare Other | Admitting: *Deleted

## 2020-04-29 ENCOUNTER — Other Ambulatory Visit: Payer: Self-pay

## 2020-04-29 DIAGNOSIS — Z952 Presence of prosthetic heart valve: Secondary | ICD-10-CM | POA: Diagnosis not present

## 2020-04-29 DIAGNOSIS — Z5181 Encounter for therapeutic drug level monitoring: Secondary | ICD-10-CM

## 2020-04-29 DIAGNOSIS — Z9889 Other specified postprocedural states: Secondary | ICD-10-CM | POA: Diagnosis not present

## 2020-04-29 DIAGNOSIS — Z8679 Personal history of other diseases of the circulatory system: Secondary | ICD-10-CM

## 2020-04-29 LAB — POCT INR: INR: 3 (ref 2.0–3.0)

## 2020-04-29 NOTE — Patient Instructions (Addendum)
Description   Continue taking Warfarin 1 tablet everyday. Recheck in 7 weeks. Call with any new medications or procedures [phone]

## 2020-06-03 ENCOUNTER — Telehealth: Payer: Self-pay

## 2020-06-03 NOTE — Telephone Encounter (Signed)
Patient has headache with fatigue for 1 week. She would like to know if she should get a Covid test. She has been vaccinated.

## 2020-06-04 ENCOUNTER — Telehealth (INDEPENDENT_AMBULATORY_CARE_PROVIDER_SITE_OTHER): Payer: Medicare Other | Admitting: Family Medicine

## 2020-06-04 ENCOUNTER — Other Ambulatory Visit: Payer: Self-pay

## 2020-06-04 DIAGNOSIS — R05 Cough: Secondary | ICD-10-CM | POA: Diagnosis not present

## 2020-06-04 DIAGNOSIS — R519 Headache, unspecified: Secondary | ICD-10-CM

## 2020-06-04 DIAGNOSIS — W19XXXA Unspecified fall, initial encounter: Secondary | ICD-10-CM

## 2020-06-04 DIAGNOSIS — R058 Other specified cough: Secondary | ICD-10-CM

## 2020-06-04 NOTE — Telephone Encounter (Signed)
Patient will need VV.  No openings for Dr Raoul Pitch today, but Dr Maudie Mercury is available for VV all day.

## 2020-06-04 NOTE — Telephone Encounter (Signed)
LM for pt to return call to offer appt.

## 2020-06-04 NOTE — Progress Notes (Signed)
Virtual Visit via Telephone Note  I connected with [name] [name] on 06/04/20 at  1:00 PM EDT by telephone and verified that I am speaking with the correct person using two identifiers.   I discussed the limitations, risks, security and privacy concerns of performing an evaluation and management service by telephone and the availability of in person appointments. I also discussed with the patient that there may be a patient responsible charge related to this service. The patient expressed understanding and agreed to proceed.  Location patient: home Location provider: work or home office Participants present for the call: patient, provider Patient did not have a visit in the prior 7 days to address this/these issue(s).   History of Present Illness:  Acute visit for -started about 10 days ago -was having headache, feeling tired and sick initially the first 3 days, tylenol helped and the HA resolved, mild dry cough -she had a mechanical fall in her bedroom a few days later, no injuring and no symptoms from the fall except scraped her knee on the carpet - feels is doing ok in terms of the fall, no LOC -she wonders if this could have been breakthrough COVID19 -symptoms have now completely resolved -denies any fevers, SOB, CP, NVD, loss of taste or smell -she denies any sick contacts and reports does not see hardly anyone other than her next door neighbor -she is fully vaccinated for COVID19   Observations/Objective: Patient sounds cheerful and well on the phone. I do not appreciate any SOB. Speech and thought processing are grossly intact. Patient reported vitals:  Assessment and Plan:  Nonintractable headache, unspecified chronicity pattern, unspecified headache type  Dry cough  Fall, initial encounter  -we discussed possible serious and likely etiologies, options for evaluation and workup, limitations of telemedicine visit vs in person visit, treatment, treatment risks and  precautions. Pt prefers to treat via telemedicine empirically rather then risking or undertaking an in person visit at this moment. At this point all of her symptoms have resolved and she feels safe in terms of the recent fall - denies any significant injury. She mainly has questions about if it is possible to get covid after the vaccine and if this could have been covid and testing. Discussed that it is still possible to get covid after the vaccine. Discussed testing options, though with all symptoms now resolved do not know that testing at this point would change treatment or if the test would pick up COVID if this was covid. Discussed strategies for prevention of COVID19 and she had lots of questions. Questions answered. Advised to seek prompt follow up telemedicine visit or in person care if any recurrent or new symptoms arise. Did let her know that I only do telemedicine on Tuesdays and Thursdays for [place] and advised follow up visit with PCP or [facility] if needs follow up or if any further questions arise to avoid any delays.  Follow Up Instructions:   I did not refer this patient for an OV in the next 24 hours for this/these issue(s).  I discussed the assessment and treatment plan with the patient. The patient was provided an opportunity to ask questions and all were answered. The patient agreed with the plan and demonstrated an understanding of the instructions.   The patient was advised to call back or seek an in-person evaluation if the symptoms worsen or if the condition fails to improve as anticipated.  I provided  22 minutes of non-face-to-face time during this encounter.  Lucretia Kern, DO

## 2020-06-04 NOTE — Telephone Encounter (Signed)
Patient has been scheduled for virtual visit with Dr. Maudie Mercury for today

## 2020-06-17 ENCOUNTER — Ambulatory Visit (INDEPENDENT_AMBULATORY_CARE_PROVIDER_SITE_OTHER): Payer: Medicare Other | Admitting: *Deleted

## 2020-06-17 ENCOUNTER — Other Ambulatory Visit: Payer: Self-pay

## 2020-06-17 DIAGNOSIS — Z5181 Encounter for therapeutic drug level monitoring: Secondary | ICD-10-CM

## 2020-06-17 DIAGNOSIS — Z952 Presence of prosthetic heart valve: Secondary | ICD-10-CM | POA: Diagnosis not present

## 2020-06-17 DIAGNOSIS — Z9889 Other specified postprocedural states: Secondary | ICD-10-CM

## 2020-06-17 DIAGNOSIS — Z8679 Personal history of other diseases of the circulatory system: Secondary | ICD-10-CM

## 2020-06-17 LAB — POCT INR: INR: 2.4 (ref 2.0–3.0)

## 2020-06-17 NOTE — Patient Instructions (Signed)
Description   Today take 1.5 tablets then continue taking Warfarin 1 tablet everyday. Recheck in 6 weeks. Call with any new medications or procedures [phone]

## 2020-07-08 DIAGNOSIS — Z1231 Encounter for screening mammogram for malignant neoplasm of breast: Secondary | ICD-10-CM | POA: Diagnosis not present

## 2020-07-08 LAB — HM MAMMOGRAPHY

## 2020-07-09 ENCOUNTER — Telehealth: Payer: Self-pay

## 2020-07-09 NOTE — Telephone Encounter (Signed)
Left detailed message per DPR  

## 2020-07-09 NOTE — Telephone Encounter (Signed)
[facility] mammography result received via fax. Placed on PCP desk for review

## 2020-07-09 NOTE — Telephone Encounter (Signed)
Mammogram is normal. Please make patient aware

## 2020-07-24 ENCOUNTER — Telehealth: Payer: Self-pay | Admitting: Cardiology

## 2020-07-24 NOTE — Telephone Encounter (Signed)
° ° °  Pt c/o medication issue:  1. Name of Medication:   valsartan (DIOVAN) 80 MG tablet    2. How are you currently taking this medication (dosage and times per day)? Take 1 tablet (80 mg total) by mouth daily.  3. Are you having a reaction (difficulty breathing--STAT)?   4. What is your medication issue? Pt said she tried taking valsartan and she didn't like it and went back taking her Lisinopril. She would like to go back taking it and make sure she will have a refill for it

## 2020-07-24 NOTE — Telephone Encounter (Signed)
Its ok to switch back to lisinopril

## 2020-07-24 NOTE — Telephone Encounter (Signed)
Pt is calling in to let Dr. Meda Coffee know that when she was switched from lisinopril to valsartan back in July, she only tried the Valsartan for a week, then stopped and went back to her original regimen of lisinopril. Pt states she was taken off of lisinopril 5 mg po daily back in July, for complaints of sleepiness while on this regimen.  Pt states Dr. Meda Coffee then switched her regimen to Valsartan. Pt states she took the Valsartan for about a week or so, then started to feel depressed.  She then stopped that med and switched herself back to her lisinopril. Pt states she will be due for a refill of her lisinopril soon, and would like to know if Dr. Meda Coffee agrees with her making the decision to switch back to her old lisinopril regimen, or does she want to advise on an alternative regimen.  Pt states she would like to stay on lisinopril 5 mg po daily if possible, even though this was stopped by [place] back in July.  She states she will not go back to Valsartan.  Discontinued the pts Valsartan from her med list, updated it in her allergies.  Informed her that I will need to route this message to Dr. Meda Coffee to further review and advise on her lisinopril.  Informed her that I will follow-up with her shortly thereafter, once further recommendations are provided.  Pt verbalized understanding and agrees with this plan.

## 2020-07-25 ENCOUNTER — Other Ambulatory Visit: Payer: Self-pay | Admitting: Cardiology

## 2020-07-25 MED ORDER — LISINOPRIL 5 MG PO TABS
5.0000 mg | ORAL_TABLET | Freq: Every day | ORAL | 1 refills | Status: DC
Start: 2020-07-25 — End: 2021-01-29

## 2020-07-25 MED ORDER — WARFARIN SODIUM 5 MG PO TABS: ORAL_TABLET | ORAL | 0 refills | Status: DC

## 2020-07-25 NOTE — Telephone Encounter (Signed)
*  STAT* If patient is at the pharmacy, call can be transferred to refill team.   1. Which medications need to be refilled? (please list name of each medication and dose if known) warfarin (COUMADIN) 5 MG tablet  2. Which pharmacy/location (including street and city if local pharmacy) is medication to be sent to? [facility] [phone] - [place], [place]  3. Do they need a 30 day or 90 day supply? 90 day

## 2020-07-25 NOTE — Telephone Encounter (Signed)
Pt aware that per Dr. Meda Coffee, she can switch back to lisinopril 5 mg po daily, and discontinue valsartan.  Refill for lisinopril sent to the pts confirmed pharmacy of choice, for a 90 day supply, as she requested.  Pt verbalized understanding and agrees with this plan.

## 2020-07-29 ENCOUNTER — Other Ambulatory Visit: Payer: Self-pay

## 2020-07-29 ENCOUNTER — Ambulatory Visit (INDEPENDENT_AMBULATORY_CARE_PROVIDER_SITE_OTHER): Payer: Medicare Other | Admitting: *Deleted

## 2020-07-29 DIAGNOSIS — Z9889 Other specified postprocedural states: Secondary | ICD-10-CM | POA: Diagnosis not present

## 2020-07-29 DIAGNOSIS — Z952 Presence of prosthetic heart valve: Secondary | ICD-10-CM | POA: Diagnosis not present

## 2020-07-29 DIAGNOSIS — Z8679 Personal history of other diseases of the circulatory system: Secondary | ICD-10-CM | POA: Diagnosis not present

## 2020-07-29 DIAGNOSIS — Z5181 Encounter for therapeutic drug level monitoring: Secondary | ICD-10-CM

## 2020-07-29 LAB — POCT INR: INR: 2.5 (ref 2.0–3.0)

## 2020-07-29 NOTE — Patient Instructions (Signed)
Description   Continue taking Warfarin 1 tablet everyday. Recheck in 6 weeks. Call with any new medications or procedures [phone]

## 2020-08-05 DIAGNOSIS — Z23 Encounter for immunization: Secondary | ICD-10-CM | POA: Diagnosis not present

## 2020-08-08 ENCOUNTER — Telehealth: Payer: Self-pay | Admitting: Family Medicine

## 2020-08-08 NOTE — Telephone Encounter (Signed)
Patient states she received Covid Phizer booster on 08/05/20. She would like to speak with someone regarding after effects and implications. Please call patient to advise.

## 2020-08-08 NOTE — Telephone Encounter (Signed)
Pt just wanted to verify that it would be safe for her to be around unvaccinated people since she has had her booster. I advised her to practice social distancing, hand washing, wear her mask, and if other person(s) have any flu like symptoms to try and wait to visit until they are without symptoms. Stated she would wait a week to visit so that the booster has time to "kick in".

## 2020-08-08 NOTE — Telephone Encounter (Signed)
Please call patient for more information, may need to contact pharmacist or place of administration.

## 2020-08-08 NOTE — Telephone Encounter (Signed)
Please advise, thanks.

## 2020-08-22 ENCOUNTER — Other Ambulatory Visit: Payer: Self-pay

## 2020-08-22 ENCOUNTER — Ambulatory Visit (INDEPENDENT_AMBULATORY_CARE_PROVIDER_SITE_OTHER): Payer: Medicare Other | Admitting: Sports Medicine

## 2020-08-22 ENCOUNTER — Encounter: Payer: Self-pay | Admitting: Sports Medicine

## 2020-08-22 DIAGNOSIS — M79671 Pain in right foot: Secondary | ICD-10-CM

## 2020-08-22 DIAGNOSIS — B351 Tinea unguium: Secondary | ICD-10-CM

## 2020-08-22 DIAGNOSIS — I739 Peripheral vascular disease, unspecified: Secondary | ICD-10-CM

## 2020-08-22 DIAGNOSIS — M79676 Pain in unspecified toe(s): Secondary | ICD-10-CM

## 2020-08-22 DIAGNOSIS — M2040 Other hammer toe(s) (acquired), unspecified foot: Secondary | ICD-10-CM

## 2020-08-22 DIAGNOSIS — M79672 Pain in left foot: Secondary | ICD-10-CM

## 2020-08-22 DIAGNOSIS — L84 Corns and callosities: Secondary | ICD-10-CM

## 2020-08-22 NOTE — Progress Notes (Signed)
Subjective: [name] [name] is a 69 y.o. female patient seen today in office with complaint of mildly painful thickened and elongated toenails and callus to toes; unable to trim. Patient denies history of Diabetes or neuropathy but does admit to an issue with her circulation and states that she had some black discoloration to the toes on her left foot. Patient has no other pedal complaints at this time.   Patient Active Problem List   Diagnosis Date Noted  . PVCs (premature ventricular contractions) 03/30/2020  . Osteoporosis 07/05/2019  . Nonischemic cardiomyopathy ([employer]) 07/20/2018  . Chest pain 07/20/2018  . Scoliosis 11/20/2016  . Long term current use of anticoagulant 09/18/2016  . Compression fracture of lumbar spine, non-traumatic, sequela 08/11/2016  . Multiple falls 08/11/2016  . At high risk for injury related to fall 08/11/2016  . Encounter for therapeutic drug monitoring 11/27/2013  . Hypothyroidism   . CHF (congestive heart failure) ([place])   . Paroxysmal atrial fibrillation (HCC)   . Arthritis   . Neurogenic bladder   . Warfarin anticoagulation   . First degree heart block 01/29/2012  . 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 S/P Maze operation for atrial fibrillation 01/26/2012  . Anxiety 01/18/2012  . Multiple sclerosis ([place]) 01/19/2007    Current Outpatient Medications on File Prior to Visit  Medication Sig Dispense Refill  . amoxicillin (AMOXIL) 500 MG capsule take 4 capsules by mouth 1 hour prior to dental appointment  0  . Biotin 5000 MCG CAPS Take 20,000 mcg by mouth.     . Black Cohosh 40 MG CAPS Take 40 mg by mouth daily.    . Bromelains (BROMELAIN PO) Take 1 capsule by mouth 2 (two) times daily.    . calcium carbonate (OSCAL) 1500 (600 Ca) MG TABS tablet Takes 1 time daily    . Cholecalciferol (VITAMIN D3) 2000 units capsule Take 6,000 Units by mouth daily.     . Coenzyme Q10 (CO Q-10) 100 MG CAPS Take 1 capsule by mouth daily.     . Cranberry 500 MG CAPS  Take 1 capsule by mouth daily.    Marland Kitchen L-THEANINE PO Take 1 capsule by mouth daily.     Marland Kitchen lactose free nutrition (BOOST PLUS) LIQD Take 237 mLs by mouth daily.     Marland Kitchen lidocaine-prilocaine (EMLA) cream Apply 1 application topically as needed (per pt this is for use before blood draws).   0  . lisinopril (ZESTRIL) 5 MG tablet Take 1 tablet (5 mg total) by mouth daily. 90 tablet 1  . Lysine 500 MG CAPS Take 1 capsule by mouth daily.    . Magnesium Citrate 200 MG TABS Take 200 mg by mouth daily as needed.    . metoprolol tartrate (LOPRESSOR) 25 MG tablet TAKE 1 TAB (25 MG) PO TWICE DAILY - pt must make appt with provider for further refills - 1st attempt 180 tablet 3  . MILK THISTLE PO Take 1 capsule by mouth 2 (two) times daily.    . Misc Natural Products (GLUCOSAMINE CHOND COMPLEX/MSM PO) Strength of dose Glucosamine 1500mg  /Chodroitron 1000mg / MSM 500mg  takes one twice daily    . NON FORMULARY 1 capsule 2 (two) times daily.    Jonna Coup Leaf 500 MG CAPS Take 1 capsule by mouth 2 (two) times daily.     . Petasin (PETADOLEX PO) Take by mouth 2 (two) times daily.    . Probiotic Product (PROBIOTIC DAILY PO) Take 1 capsule once a day    .  PROGESTERONE MICRONIZED PO Take 100 mg by mouth daily.     . Pumpkin Seed (URIPLEX) 500 MG TABS Take 1 tablet by mouth 2 (two) times daily.    Marland Kitchen thyroid (ARMOUR) 90 MG tablet Take 90 mg by mouth daily.    . TURMERIC PO Take by mouth.    . vitamin A 10000 UNIT capsule Take 10,000 Units by mouth daily.    . vitamin B-12 (CYANOCOBALAMIN) 1000 MCG tablet Take 1,000 mcg by mouth daily. Taking sublingal    . vitamin E 400 UNIT capsule Take 400 Units by mouth daily.    Marland Kitchen warfarin (COUMADIN) 5 MG tablet TAKE 1 TABLET BY MOUTH EVERY DAY OR AS DIRECTED BY COUMADIN CLINIC 110 tablet 0  . OVER THE COUNTER MEDICATION Red marine algae - 1 capsules daily     No current facility-administered medications on file prior to visit.    Allergies  Allergen Reactions  . Erythromycin  Nausea And Vomiting  . Valsartan Other (See Comments)    Pt reports caused her depression    Objective: Physical Exam  General: Well developed, nourished, no acute distress, awake, alert and oriented x 3  Vascular: Dorsalis pedis artery 1/4 bilateral, Posterior tibial artery 0/4 bilateral, skin temperature warm to warm proximal to distal bilateral lower extremities, moderate varicosities, no pedal hair present bilateral.  Trace edema noted bilateral.  Neurological: Gross sensation present via light touch bilateral.   Dermatological: Skin is warm, dry, and supple bilateral, Nails 1-10 are tender, long, thick, and discolored with mild subungal debris with first toes most involved bilateral, no webspace macerations present bilateral, no open lesions present bilateral, + callus/corns/hyperkeratotic tissue present bilateral 3rd toes. No signs of infection bilateral.  Musculoskeletal: Asymptomatic hammertoe boney deformities noted bilateral. Muscular strength within normal limits without painon range of motion. No pain with calf compression bilateral.  Assessment and Plan:  Problem List Items Addressed This Visit    None    Visit Diagnoses    Pain due to onychomycosis of toenail    -  Primary   Corn of toe       PVD (peripheral vascular disease) ([place])       Hammer toe, unspecified laterality       Foot pain, bilateral          -Examined patient.  -Discussed treatment options for painful mycotic nails and corns. -Mechanically debrided and reduced mycotic nails with sterile nail nipper and dremel nail file without incident. -Mechanically debrided corns bilateral third toes using a chisel blade without incident -Dispensed toe cap for patient to use as instructed and may use crest pad as needed -Advised good supportive shoes and sizing up to avoid chronic toes -May continue with topical cream as needed for pain or inflammation to feet -Encourage elevation to assist with edema control with  circulation issues -Patient to return in 3 months for follow up evaluation or sooner if symptoms worsen.  Landis Martins, DPM

## 2020-09-03 ENCOUNTER — Telehealth: Payer: Self-pay | Admitting: *Deleted

## 2020-09-03 NOTE — Telephone Encounter (Addendum)
Patient is having left foot, middle toe soreness after debridement 2 wks ago. She is unable to wear the toe cap,toe crest given by Dr. Cannon Kettle, makes the toe hurt worse.How should she alleviate the pain,please advise.

## 2020-09-04 NOTE — Telephone Encounter (Signed)
Pt called again today and stated that she seen Dr. Cannon Kettle on 08/22/20. Pt states that she is having toe pain. Please advise.

## 2020-09-05 NOTE — Telephone Encounter (Signed)
Soak with 1/4 cup of epsom salt and warm water for 15-20 mins then apply neosporin pain relief once daily and cover with bandaid if needed when in shoe. She may repeat this for the next 3-5 days or until symptoms are better. If no better after 1 week she should make a follow up appt to have the toe checked. Thanks Dr. Chauncey Cruel

## 2020-09-09 ENCOUNTER — Other Ambulatory Visit: Payer: Self-pay

## 2020-09-09 ENCOUNTER — Ambulatory Visit (INDEPENDENT_AMBULATORY_CARE_PROVIDER_SITE_OTHER): Payer: Medicare Other | Admitting: *Deleted

## 2020-09-09 DIAGNOSIS — Z952 Presence of prosthetic heart valve: Secondary | ICD-10-CM

## 2020-09-09 DIAGNOSIS — Z9889 Other specified postprocedural states: Secondary | ICD-10-CM | POA: Diagnosis not present

## 2020-09-09 DIAGNOSIS — I509 Heart failure, unspecified: Secondary | ICD-10-CM

## 2020-09-09 DIAGNOSIS — Z5181 Encounter for therapeutic drug level monitoring: Secondary | ICD-10-CM

## 2020-09-09 DIAGNOSIS — Z8679 Personal history of other diseases of the circulatory system: Secondary | ICD-10-CM | POA: Diagnosis not present

## 2020-09-09 LAB — POCT INR: INR: 2 (ref 2.0–3.0)

## 2020-09-09 NOTE — Patient Instructions (Signed)
Description   Today take 1.5 tablets then continue taking Warfarin 1 tablet everyday. Recheck in 4 weeks with MD Appt. Call with any new medications or procedures [phone]

## 2020-09-16 ENCOUNTER — Telehealth: Payer: Self-pay | Admitting: Family Medicine

## 2020-09-16 NOTE — Telephone Encounter (Signed)
Spoke with patient she stated she will call back in a couple of weeks.

## 2020-09-19 ENCOUNTER — Ambulatory Visit: Payer: Medicare Other | Admitting: Sports Medicine

## 2020-09-23 DIAGNOSIS — E559 Vitamin D deficiency, unspecified: Secondary | ICD-10-CM | POA: Diagnosis not present

## 2020-09-23 DIAGNOSIS — R7989 Other specified abnormal findings of blood chemistry: Secondary | ICD-10-CM | POA: Diagnosis not present

## 2020-09-23 DIAGNOSIS — G35 Multiple sclerosis: Secondary | ICD-10-CM | POA: Diagnosis not present

## 2020-09-23 DIAGNOSIS — N951 Menopausal and female climacteric states: Secondary | ICD-10-CM | POA: Diagnosis not present

## 2020-09-23 DIAGNOSIS — E039 Hypothyroidism, unspecified: Secondary | ICD-10-CM | POA: Diagnosis not present

## 2020-09-30 DIAGNOSIS — E039 Hypothyroidism, unspecified: Secondary | ICD-10-CM | POA: Diagnosis not present

## 2020-10-01 ENCOUNTER — Ambulatory Visit (INDEPENDENT_AMBULATORY_CARE_PROVIDER_SITE_OTHER): Payer: Medicare Other | Admitting: Cardiology

## 2020-10-01 ENCOUNTER — Other Ambulatory Visit: Payer: Self-pay

## 2020-10-01 ENCOUNTER — Encounter: Payer: Self-pay | Admitting: Cardiology

## 2020-10-01 VITALS — BP 122/66 | HR 65 | Ht 67.0 in | Wt 148.2 lb

## 2020-10-01 DIAGNOSIS — I428 Other cardiomyopathies: Secondary | ICD-10-CM | POA: Diagnosis not present

## 2020-10-01 DIAGNOSIS — I493 Ventricular premature depolarization: Secondary | ICD-10-CM

## 2020-10-01 DIAGNOSIS — I480 Paroxysmal atrial fibrillation: Secondary | ICD-10-CM

## 2020-10-01 DIAGNOSIS — Z952 Presence of prosthetic heart valve: Secondary | ICD-10-CM

## 2020-10-01 NOTE — Patient Instructions (Signed)

## 2020-10-01 NOTE — Progress Notes (Signed)
Cardiology Office Note:    Date:  10/01/2020   ID:  [name] [name], DOB 22-Apr-1951, MRN [id number]  PCP:  Ma Hillock, DO  [place] Cardiologist:  No primary care provider on file.  [facility] Electrophysiologist:  None   Referring MD: Ma Hillock, DO   Reason for visit: 6 months follow-up  History of Present Illness:    [name] [name] is a 69 y.o. female with a hx of non-ischemic cardiomyopathy (echo in 2019 showed LVEF 45-50%), paroxysmal atrial fibrillation, frequent PVCs followed by Dr Lovena Le, h/o severe mitral regurgitation, s/p MVR with a mechanical valve and maze in 2019 (81mm Sorin Carbomedics Optiform Mechanical Prosthesis).  With improvement of LVEF to 65 to 70%.  The patient is coming after 6 months, she states she has been doing okay she gets occasional palpitations with no associated symptoms such as chest pain or shortness of breath presyncope or syncope.  She has been compliant with her medications, has minimal lower extremity edema, walks with a walker, had a mechanical fall in August and in September not associated with loss of consciousness.  Compliant with her Coumadin in denies any bleeding.  Her cholesterol has been severely elevated however she trust her holistic doctor located in [place] that she does not need any lipid-lowering agent despite both of her parents having coronary artery disease.  Past Medical History:  Diagnosis Date  . Anxiety   . Arthritis    knees  . Breast cancer ([place]) 1999  . CHF (congestive heart failure) ([place])    Related to severe mitral regurgitation, April, 2013  . 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 Assess by pulmonary team in the hospital April, 2013  . Ejection fraction    EF 60%, echo, April, 2013, with severe MR before mitral valve replacement  . Herpes   . Hypothyroidism   . IBS (irritable bowel syndrome)   . Mitral valve regurgitation    Mitral valve replacement  April, 2013, Mitral valve prolapse  . Multiple sclerosis ([place])   . Neurogenic bladder   . Osteoporosis   . Ovarian cyst   . Paroxysmal atrial fibrillation (HCC)    Rapid atrial fibrillation in-hospital, Rapid cardioversion,  before mitral valve surgery  . Pulmonary hypertension ([place])    Echo, April, 2013, before mitral valve surgery  . S/P Maze operation for atrial fibrillation 01/26/2012   Complete biatrial lesion set using cryothermy via right mini thoracotomy  . S/P mitral valve replacement 01/26/2012   61mm Sorin Carbomedics Optiform mechanical prosthesis via right mini thoracotomy  . Warfarin anticoagulation    Mechanical mitral prosthesis, April, 20136    Past Surgical History:  Procedure Laterality Date  . BREAST LUMPECTOMY Right 1999   with sent.node, and axillary dissection (20)  . CHEST TUBE INSERTION  01/26/2012   Procedure: CHEST TUBE INSERTION;  Surgeon: Rexene Alberts, MD;  Location: [facility];  Service: Open Heart Surgery;  Laterality: Left;  . COLONOSCOPY  2010   "normal"  . CYSTOSCOPY  1992  . LAPAROSCOPIC OVARIAN CYSTECTOMY  1978   urethral stricture repair  . LEFT AND RIGHT HEART CATHETERIZATION WITH CORONARY ANGIOGRAM N/A 01/20/2012   Procedure: LEFT AND RIGHT HEART CATHETERIZATION WITH CORONARY ANGIOGRAM;  Surgeon: Burnell Blanks, MD;  Location: [facility] CATH LAB;  Service: Cardiovascular;  Laterality: N/A;  . LYMPHADENECTOMY    . MAZE  01/26/2012   Procedure: MAZE;  Surgeon: Rexene Alberts, MD;  Location: [facility];  Service: Open Heart Surgery;  Laterality: N/A;  . MITRAL VALVE REPLACEMENT  01/26/2012   Procedure: MINIMALLY INVASIVE MITRAL VALVE (MV) REPLACEMENT;  Surgeon: Rexene Alberts, MD;  Location: [facility];  Service: Open Heart Surgery;  Laterality: Right;  . TEE WITHOUT CARDIOVERSION  01/19/2012   Procedure: TRANSESOPHAGEAL ECHOCARDIOGRAM (TEE);  Surgeon: Peter M Martinique, MD;  Location: [facility] ENDOSCOPY;  Service: Cardiovascular;  Laterality: N/A;  . TONSILLECTOMY  1970  .  [place]  . WRIST SURGERY Right 2012    Current Medications: Current Meds  Medication Sig  . amoxicillin (AMOXIL) 500 MG capsule take 4 capsules by mouth 1 hour prior to dental appointment  . Biotin 5000 MCG CAPS Take 20,000 mcg by mouth.   . Black Cohosh 40 MG CAPS Take 40 mg by mouth daily.  . Bromelains (BROMELAIN PO) Take 1 capsule by mouth 2 (two) times daily.  . calcium carbonate (OSCAL) 1500 (600 Ca) MG TABS tablet Takes 1 time daily  . Cholecalciferol (VITAMIN D3) 2000 units capsule Take 6,000 Units by mouth daily.   . Coenzyme Q10 (CO Q-10) 100 MG CAPS Take 1 capsule by mouth daily.   . Cranberry 500 MG CAPS Take 1 capsule by mouth daily.  Marland Kitchen L-THEANINE PO Take 1 capsule by mouth daily.   Marland Kitchen lactose free nutrition (BOOST PLUS) LIQD Take 237 mLs by mouth daily.   Marland Kitchen lidocaine-prilocaine (EMLA) cream Apply 1 application topically as needed (per pt this is for use before blood draws).   Marland Kitchen lisinopril (ZESTRIL) 5 MG tablet Take 1 tablet (5 mg total) by mouth daily.  Marland Kitchen Lysine 500 MG CAPS Take 1 capsule by mouth daily.  . Magnesium Citrate 200 MG TABS Take 200 mg by mouth daily as needed.  . metoprolol tartrate (LOPRESSOR) 25 MG tablet TAKE 1 TAB (25 MG) PO TWICE DAILY - pt must make appt with provider for further refills - 1st attempt  . MILK THISTLE PO Take 1 capsule by mouth 2 (two) times daily.  . Misc Natural Products (GLUCOSAMINE CHOND COMPLEX/MSM PO) Strength of dose Glucosamine 1500mg  /Chodroitron 1000mg / MSM 500mg  takes one twice daily  . NON FORMULARY 1 capsule 2 (two) times daily.  Jonna Coup Leaf 500 MG CAPS Take 1 capsule by mouth 2 (two) times daily.   Marland Kitchen OVER THE COUNTER MEDICATION Red marine algae - 1 capsules daily  . Petasin (PETADOLEX PO) Take by mouth 2 (two) times daily.  . Probiotic Product (PROBIOTIC DAILY PO) Take 1 capsule once a day  . PROGESTERONE MICRONIZED PO Take 100 mg by mouth daily.   . Pumpkin Seed 500 MG TABS Take 1 tablet by mouth 2  (two) times daily.  Marland Kitchen thyroid (ARMOUR) 90 MG tablet Take 90 mg by mouth daily.  . TURMERIC PO Take by mouth.  . vitamin A 10000 UNIT capsule Take 10,000 Units by mouth daily.  . vitamin B-12 (CYANOCOBALAMIN) 1000 MCG tablet Take 1,000 mcg by mouth daily. Taking sublingal  . vitamin E 400 UNIT capsule Take 400 Units by mouth daily.  Marland Kitchen warfarin (COUMADIN) 5 MG tablet TAKE 1 TABLET BY MOUTH EVERY DAY OR AS DIRECTED BY COUMADIN CLINIC     Allergies:   Erythromycin and Valsartan   Social History   Socioeconomic History  . Marital status: Widowed    Spouse name: Not on file  . Number of children: 2  . Years of education: 16  . Highest education level: Not on file  Occupational History  .  Occupation: Retired  Tobacco Use  . Smoking status: Never Smoker  . Smokeless tobacco: Never Used  Vaping Use  . Vaping Use: Never used  Substance and Sexual Activity  . Alcohol use: No  . Drug use: No  . Sexual activity: Never    Birth control/protection: None  Other Topics Concern  . Not on file  Social History Narrative   Patient is a widower (since 70), he currently lives with her son and daughter-in-law. She has 2 children.   She is college educated, and retired from the [employer].   She uses herbal remedies, and multiple over-the-counter supplements. She takes a daily vitamin.   She wears her seatbelt, exercises routinely, smoke detector in the home.   Requires a walker or wheelchair at times.   Feels safe in her relationships.   Social Determinants of Health   Financial Resource Strain: Not on file  Food Insecurity: Not on file  Transportation Needs: Not on file  Physical Activity: Not on file  Stress: Not on file  Social Connections: Not on file     Family History: The patient's family history includes Breast cancer in her maternal aunt and maternal grandmother; CAD in her father, mother, and another family member; Heart disease in her father; Hyperlipidemia in her mother;  Hypertension in her father and mother.  ROS:   Please see the history of present illness.     All other systems reviewed and are negative.  EKGs/Labs/Other Studies Reviewed:    The following studies were reviewed today:  EKG:  EKG is ordered today.  The ekg ordered today demonstrates SR, LVH, unchanged from prior.  Recent Labs: No results found for requested labs within last 8760 hours.  Recent Lipid Panel    Component Value Date/Time   CHOL 234 (A) 08/02/2018 0000   TRIG 62 02/14/2019 0000   HDL 62 02/14/2019 0000   CHOLHDL 4 10/29/2016 1006   VLDL 31.2 10/29/2016 1006   LDLCALC 160 02/14/2019 0000    Physical Exam:    VS:  BP 122/66   Pulse 65   Ht 5\' 7"  (1.702 m)   Wt 148 lb 3.2 oz (67.2 kg)   SpO2 96%   BMI 23.21 kg/m     Wt Readings from Last 3 Encounters:  10/01/20 148 lb 3.2 oz (67.2 kg)  03/29/20 146 lb (66.2 kg)  12/18/19 138 lb (62.6 kg)     GEN: Well nourished, well developed in no acute distress HEENT: Normal NECK: No JVD; No carotid bruits LYMPHATICS: No lymphadenopathy CARDIAC: RRR, S2 click of mechanical valve, no significant murmurs, rubs, gallops RESPIRATORY:  Clear to auscultation without rales, wheezing or rhonchi  ABDOMEN: Soft, non-tender, non-distended MUSCULOSKELETAL:  No edema; No deformity  SKIN: Warm and dry NEUROLOGIC:  Alert and oriented x 3 PSYCHIATRIC:  Normal affect   TTE: 04/2020 1. Left ventricular ejection fraction, by estimation, is 65 to 70%. The  left ventricle has normal function. The left ventricle has no regional  wall motion abnormalities. Left ventricular diastolic function could not  be evaluated.  2. Right ventricular systolic function is normal. The right ventricular  size is normal. There is normal pulmonary artery systolic pressure. The  estimated right ventricular systolic pressure is 09.6 mmHg.  3. The mitral valve is normal in structure. No evidence of mitral valve  regurgitation. No evidence of mitral  stenosis. The mean mitral valve  gradient is 3.0 mmHg with average heart rate of 65 bpm. There is  a 31 mm  Sorin present in the mitral position.  4. The aortic valve is normal in structure. Aortic valve regurgitation is  not visualized. No aortic stenosis is present.  5. The inferior vena cava is normal in size with greater than 50%  respiratory variability, suggesting right atrial pressure of 3 mmHg   ASSESSMENT:    1. Nonischemic cardiomyopathy (HCC)   2. Paroxysmal atrial fibrillation ([place])   3. S/P mitral valve replacement   4. PVCs (premature ventricular contractions)    PLAN:    In order of problems listed above:  1. NICMP -we repeated an echocardiogram in July 2021 with LVEF 65 to 70%. 2. PVC's -minimal symptoms, 1 PVC on EKG today, followed by Dr Lovena Le, recommended we have her take an extra metoprolol as needed. 3. Atrial fib - she is not having any symptomatic episodes.  She is on Coumadin for her mechanical mitral valve already. 4. S/P MVR - repeat echo with mean transmitral gradient 3 mmHg on the echo in July 2021 and normal right-sided pressures. 5.  Severe hyperlipidemia with LDL of 202 - however she trust her holistic doctor located in [place] that she does not need any lipid-lowering agent despite both of her parents having coronary artery disease.  She is adamant that she does not need therapy, statin or PCSK9 inhibitors are being explained but she refuses.   We will obtain labs from her PCP.  Medication Adjustments/Labs and Tests Ordered: Current medicines are reviewed at length with the patient today.  Concerns regarding medicines are outlined above.  Orders Placed This Encounter  Procedures  . EKG 12-Lead   No orders of the defined types were placed in this encounter.   Patient Instructions  Medication Instructions:   Your physician recommends that you continue on your current medications as directed. Please refer to the Current Medication list given to  you today.  *If you need a refill on your cardiac medications before your next appointment, please call your pharmacy*   Follow-Up: At [facility], you and your health needs are our priority.  As part of our continuing mission to provide you with exceptional heart care, we have created designated Provider Care Teams.  These Care Teams include your primary Cardiologist (physician) and Advanced Practice Providers (APPs -  Physician Assistants and Nurse Practitioners) who all work together to provide you with the care you need, when you need it.  We recommend signing up for the patient portal called "MyChart".  Sign up information is provided on this After Visit Summary.  MyChart is used to connect with patients for Virtual Visits (Telemedicine).  Patients are able to view lab/test results, encounter notes, upcoming appointments, etc.  Non-urgent messages can be sent to your provider as well.   To learn more about what you can do with MyChart, go to [email or web address].    Your next appointment:   6 month(s)  The format for your next appointment:   In Person  Provider:   Ena Dawley, MD        Signed, Ena Dawley, MD  10/01/2020 2:57 PM    [place]

## 2020-10-04 ENCOUNTER — Ambulatory Visit (INDEPENDENT_AMBULATORY_CARE_PROVIDER_SITE_OTHER): Payer: Medicare Other | Admitting: Internal Medicine

## 2020-10-04 ENCOUNTER — Ambulatory Visit (INDEPENDENT_AMBULATORY_CARE_PROVIDER_SITE_OTHER): Payer: Medicare Other

## 2020-10-04 ENCOUNTER — Encounter: Payer: Self-pay | Admitting: Internal Medicine

## 2020-10-04 ENCOUNTER — Other Ambulatory Visit: Payer: Self-pay

## 2020-10-04 VITALS — BP 122/68 | HR 63 | Ht 67.0 in | Wt 147.4 lb

## 2020-10-04 DIAGNOSIS — Z5181 Encounter for therapeutic drug level monitoring: Secondary | ICD-10-CM

## 2020-10-04 DIAGNOSIS — Z9889 Other specified postprocedural states: Secondary | ICD-10-CM

## 2020-10-04 DIAGNOSIS — I509 Heart failure, unspecified: Secondary | ICD-10-CM

## 2020-10-04 DIAGNOSIS — I493 Ventricular premature depolarization: Secondary | ICD-10-CM

## 2020-10-04 DIAGNOSIS — I480 Paroxysmal atrial fibrillation: Secondary | ICD-10-CM | POA: Diagnosis not present

## 2020-10-04 DIAGNOSIS — Z8679 Personal history of other diseases of the circulatory system: Secondary | ICD-10-CM

## 2020-10-04 DIAGNOSIS — Z952 Presence of prosthetic heart valve: Secondary | ICD-10-CM

## 2020-10-04 LAB — POCT INR: INR: 1.9 — AB (ref 2.0–3.0)

## 2020-10-04 NOTE — Patient Instructions (Signed)
Description   Today take 1.5 tablets then start taking Warfarin 1 tablet daily except 1.5 tablets on Mondays. Recheck in 3 weeks. Call with any new medications or procedures [phone]

## 2020-10-04 NOTE — Progress Notes (Signed)
HPI [name] [name] is a 69 y.o. female who presents for ongoing eval of PVC's and atrial fib. She has a h/o symptomatic PVC's. She has mild LV dysfunction and a prior MV replacement and a MAZE.  Since last being seen in our clinic, the patient reports very rare episodes of palpitations and no sustained arrhythmias. She does not think she has had atrial fib in several years. She will take an extra lopressor when she experiences her palpitations, typically less than once a month. Allergies  Allergen Reactions  . Erythromycin Nausea And Vomiting  . Valsartan Other (See Comments)    Pt reports caused her depression     Current Outpatient Medications  Medication Sig Dispense Refill  . amoxicillin (AMOXIL) 500 MG capsule take 4 capsules by mouth 1 hour prior to dental appointment  0  . Biotin 5000 MCG CAPS Take 20,000 mcg by mouth.     . Black Cohosh 40 MG CAPS Take 40 mg by mouth daily.    . Bromelains (BROMELAIN PO) Take 1 capsule by mouth 2 (two) times daily.    . calcium carbonate (OSCAL) 1500 (600 Ca) MG TABS tablet Takes 1 time daily    . Cholecalciferol (VITAMIN D3) 2000 units capsule Take 6,000 Units by mouth daily.     . Coenzyme Q10 (CO Q-10) 100 MG CAPS Take 1 capsule by mouth daily.     . Cranberry 500 MG CAPS Take 1 capsule by mouth daily.    Marland Kitchen L-THEANINE PO Take 1 capsule by mouth daily.     Marland Kitchen lactose free nutrition (BOOST PLUS) LIQD Take 237 mLs by mouth daily.     Marland Kitchen lidocaine-prilocaine (EMLA) cream Apply 1 application topically as needed (per pt this is for use before blood draws).   0  . lisinopril (ZESTRIL) 5 MG tablet Take 1 tablet (5 mg total) by mouth daily. 90 tablet 1  . Lysine 500 MG CAPS Take 1 capsule by mouth daily.    . Magnesium Citrate 200 MG TABS Take 200 mg by mouth daily as needed.    . metoprolol tartrate (LOPRESSOR) 25 MG tablet TAKE 1 TAB (25 MG) PO TWICE DAILY - pt must make appt with provider for further refills - 1st attempt 180 tablet 3  .  MILK THISTLE PO Take 1 capsule by mouth 2 (two) times daily.    . Misc Natural Products (GLUCOSAMINE CHOND COMPLEX/MSM PO) Strength of dose Glucosamine 1500mg  /Chodroitron 1000mg / MSM 500mg  takes one twice daily    . NON FORMULARY 1 capsule 2 (two) times daily.    Jonna Coup Leaf 500 MG CAPS Take 1 capsule by mouth 2 (two) times daily.     . Petasin (PETADOLEX PO) Take by mouth 2 (two) times daily.    . Probiotic Product (PROBIOTIC DAILY PO) Take 1 capsule once a day    . PROGESTERONE MICRONIZED PO Take 100 mg by mouth daily.     . Pumpkin Seed 500 MG TABS Take 1 tablet by mouth 2 (two) times daily.    Marland Kitchen thyroid (ARMOUR) 90 MG tablet Take 90 mg by mouth daily.    . TURMERIC PO Take by mouth.    . vitamin A 10000 UNIT capsule Take 10,000 Units by mouth daily.    . vitamin B-12 (CYANOCOBALAMIN) 1000 MCG tablet Take 1,000 mcg by mouth daily. Taking sublingal    . vitamin E 400 UNIT capsule Take 400 Units by mouth daily.    Marland Kitchen  warfarin (COUMADIN) 5 MG tablet TAKE 1 TABLET BY MOUTH EVERY DAY OR AS DIRECTED BY COUMADIN CLINIC 110 tablet 0   No current facility-administered medications for this visit.     Past Medical History:  Diagnosis Date  . Anxiety   . Arthritis    knees  . Breast cancer ([place]) 1999  . CHF (congestive heart failure) ([place])    Related to severe mitral regurgitation, April, 2013  . 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 Assess by pulmonary team in the hospital April, 2013  . Ejection fraction    EF 60%, echo, April, 2013, with severe MR before mitral valve replacement  . Herpes   . Hypothyroidism   . IBS (irritable bowel syndrome)   . Mitral valve regurgitation    Mitral valve replacement April, 2013, Mitral valve prolapse  . Multiple sclerosis ([place])   . Neurogenic bladder   . Osteoporosis   . Ovarian cyst   . Paroxysmal atrial fibrillation (H[facility])    Rapid atrial fibrillation in-hospital, Rapid cardioversion,  before mitral valve  surgery  . Pulmonary hypertension ([place])    Echo, April, 2013, before mitral valve surgery  . S/P Maze operation for atrial fibrillation 01/26/2012   Complete biatrial lesion set using cryothermy via right mini thoracotomy  . 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 Warfarin anticoagulation    Mechanical mitral prosthesis, April, 20136    ROS:   All systems reviewed and negative except as noted in the HPI.   Past Surgical History:  Procedure Laterality Date  . BREAST LUMPECTOMY Right 1999   with sent.node, and axillary dissection (20)  . CHEST TUBE INSERTION  01/26/2012   Procedure: CHEST TUBE INSERTION;  Surgeon: Rexene Alberts, MD;  Location: [facility];  Service: Open Heart Surgery;  Laterality: Left;  . COLONOSCOPY  2010   "normal"  . CYSTOSCOPY  1992  . LAPAROSCOPIC OVARIAN CYSTECTOMY  1978   urethral stricture repair  . LEFT AND RIGHT HEART CATHETERIZATION WITH CORONARY ANGIOGRAM N/A 01/20/2012   Procedure: LEFT AND RIGHT HEART CATHETERIZATION WITH CORONARY ANGIOGRAM;  Surgeon: Burnell Blanks, MD;  Location: [facility] CATH LAB;  Service: Cardiovascular;  Laterality: N/A;  . LYMPHADENECTOMY    . MAZE  01/26/2012   Procedure: MAZE;  Surgeon: Rexene Alberts, MD;  Location: [facility];  Service: Open Heart Surgery;  Laterality: N/A;  . MITRAL VALVE REPLACEMENT  01/26/2012   Procedure: MINIMALLY INVASIVE MITRAL VALVE (MV) REPLACEMENT;  Surgeon: Rexene Alberts, MD;  Location: [facility];  Service: Open Heart Surgery;  Laterality: Right;  . TEE WITHOUT CARDIOVERSION  01/19/2012   Procedure: TRANSESOPHAGEAL ECHOCARDIOGRAM (TEE);  Surgeon: Peter M Martinique, MD;  Location: [facility] ENDOSCOPY;  Service: Cardiovascular;  Laterality: N/A;  . TONSILLECTOMY  1970  . [place]  . WRIST SURGERY Right 2012     Family History  Problem Relation Age of Onset  . Heart disease Father        cardiac arrest   . CAD  Father   . Hypertension Father   . CAD Mother        5 stents and numerous bypass surgery  . Hypertension Mother   . Hyperlipidemia Mother   . CAD Other   . Breast cancer Maternal Grandmother   . Breast cancer Maternal Aunt      Social History   Socioeconomic History  .  Marital status: Widowed    Spouse name: Not on file  . Number of children: 2  . Years of education: 14  . Highest education level: Not on file  Occupational History  . Occupation: Retired  Tobacco Use  . Smoking status: Never Smoker  . Smokeless tobacco: Never Used  Vaping Use  . Vaping Use: Never used  Substance and Sexual Activity  . Alcohol use: No  . Drug use: No  . Sexual activity: Never    Birth control/protection: None  Other Topics Concern  . Not on file  Social History Narrative   Patient is a widower (since 79), he currently lives with her son and daughter-in-law. She has 2 children.   She is college educated, and retired from the [employer].   She uses herbal remedies, and multiple over-the-counter supplements. She takes a daily vitamin.   She wears her seatbelt, exercises routinely, smoke detector in the home.   Requires a walker or wheelchair at times.   Feels safe in her relationships.   Social Determinants of Health   Financial Resource Strain: Not on file  Food Insecurity: Not on file  Transportation Needs: Not on file  Physical Activity: Not on file  Stress: Not on file  Social Connections: Not on file  Intimate Partner Violence: Not on file     BP 122/68   Pulse 63   Ht 5\' 7"  (1.702 m)   Wt 147 lb 6.4 oz (66.9 kg)   SpO2 97%   BMI 23.09 kg/m   Physical Exam:  kyphotic appearing 69 yo woman, NAD HEENT: Unremarkable Neck:  No JVD, no thyromegally Lymphatics:  No adenopathy Back:  No CVA tenderness Lungs:  Clear with no wheezes HEART:  Regular rate rhythm, no murmurs, no rubs, no clicks; mechanical S1. Abd:  soft, positive bowel sounds, no organomegally, no  rebound, no guarding Ext:  2 plus pulses, no edema, no cyanosis, no clubbing Skin:  No rashes no nodules Neuro:  CN II through XII intact, motor grossly intact  EKG - reviewed from 3 days ago. NSR with rare PVC   Assess/Plan: 1. PVC's - she will continue her beta blocker. I encouraged her to avoid excessive caffeine 2. Atrial fib - her symptoms are quiet. She will continue coumadin. No indication for AA drug therapy. 3. Coags - she has done well.  Carleene Overlie Corneilus Heggie,MD

## 2020-10-04 NOTE — Patient Instructions (Signed)
Medication Instructions:  Your physician recommends that you continue on your current medications as directed. Please refer to the Current Medication list given to you today.  Labwork: None ordered.  Testing/Procedures: None ordered.  Follow-Up: Your physician wants you to follow-up in: as needed with Dr. Taylor.      Any Other Special Instructions Will Be Listed Below (If Applicable).  If you need a refill on your cardiac medications before your next appointment, please call your pharmacy.   

## 2020-10-08 ENCOUNTER — Encounter: Payer: Medicare Other | Admitting: Internal Medicine

## 2020-10-18 DIAGNOSIS — R3981 Functional urinary incontinence: Secondary | ICD-10-CM | POA: Diagnosis not present

## 2020-10-25 ENCOUNTER — Other Ambulatory Visit: Payer: Self-pay

## 2020-10-25 ENCOUNTER — Ambulatory Visit (INDEPENDENT_AMBULATORY_CARE_PROVIDER_SITE_OTHER): Payer: Medicare Other | Admitting: *Deleted

## 2020-10-25 DIAGNOSIS — Z9889 Other specified postprocedural states: Secondary | ICD-10-CM

## 2020-10-25 DIAGNOSIS — Z5181 Encounter for therapeutic drug level monitoring: Secondary | ICD-10-CM

## 2020-10-25 DIAGNOSIS — Z8679 Personal history of other diseases of the circulatory system: Secondary | ICD-10-CM | POA: Diagnosis not present

## 2020-10-25 DIAGNOSIS — Z952 Presence of prosthetic heart valve: Secondary | ICD-10-CM

## 2020-10-25 LAB — POCT INR: INR: 2 (ref 2.0–3.0)

## 2020-10-25 MED ORDER — WARFARIN SODIUM 5 MG PO TABS: ORAL_TABLET | ORAL | 0 refills | Status: DC

## 2020-10-25 NOTE — Patient Instructions (Signed)
Description   Today take 1.5 tablets then start taking Warfarin 1 tablet daily except 1.5 tablets on Mondays and Thursdays. Recheck in 3 weeks. Call with any new medications or procedures [phone]

## 2020-11-14 ENCOUNTER — Other Ambulatory Visit: Payer: Self-pay | Admitting: Cardiology

## 2020-11-14 NOTE — Telephone Encounter (Addendum)
90 day warfarin refill (90 day supply) was sent 10/25/20 at her Anticoagulation Appt and it is  confirmed as received; will call the pharmacy to ensure received.   Spoke with Gearlean Alf and she stated the pt has picked up the prescription. Advised we received another once and she states no sure why. They have the 90 day supply. Also, updated her with the pt instructions so she is aware that the pt takes 1.5 tabs a few days out of the week so the 110 tabs is a 90 day supply.

## 2020-11-19 ENCOUNTER — Ambulatory Visit (INDEPENDENT_AMBULATORY_CARE_PROVIDER_SITE_OTHER): Payer: Medicare Other | Admitting: *Deleted

## 2020-11-19 ENCOUNTER — Other Ambulatory Visit: Payer: Self-pay

## 2020-11-19 DIAGNOSIS — Z9889 Other specified postprocedural states: Secondary | ICD-10-CM | POA: Diagnosis not present

## 2020-11-19 DIAGNOSIS — Z952 Presence of prosthetic heart valve: Secondary | ICD-10-CM | POA: Diagnosis not present

## 2020-11-19 DIAGNOSIS — Z5181 Encounter for therapeutic drug level monitoring: Secondary | ICD-10-CM | POA: Diagnosis not present

## 2020-11-19 DIAGNOSIS — Z8679 Personal history of other diseases of the circulatory system: Secondary | ICD-10-CM | POA: Diagnosis not present

## 2020-11-19 LAB — POCT INR: INR: 3.2 — AB (ref 2.0–3.0)

## 2020-11-19 NOTE — Patient Instructions (Signed)
Description   Continue taking Warfarin 1 tablet daily except 1.5 tablets on Mondays and Thursdays. Recheck in 4 weeks. Call with any new medications or procedures [phone]

## 2020-11-21 ENCOUNTER — Ambulatory Visit: Payer: Medicare Other | Admitting: Sports Medicine

## 2020-12-17 ENCOUNTER — Ambulatory Visit (INDEPENDENT_AMBULATORY_CARE_PROVIDER_SITE_OTHER): Payer: Medicare Other

## 2020-12-17 ENCOUNTER — Other Ambulatory Visit: Payer: Self-pay

## 2020-12-17 DIAGNOSIS — I509 Heart failure, unspecified: Secondary | ICD-10-CM | POA: Diagnosis not present

## 2020-12-17 DIAGNOSIS — Z952 Presence of prosthetic heart valve: Secondary | ICD-10-CM

## 2020-12-17 DIAGNOSIS — Z8679 Personal history of other diseases of the circulatory system: Secondary | ICD-10-CM | POA: Diagnosis not present

## 2020-12-17 DIAGNOSIS — Z5181 Encounter for therapeutic drug level monitoring: Secondary | ICD-10-CM

## 2020-12-17 DIAGNOSIS — Z9889 Other specified postprocedural states: Secondary | ICD-10-CM | POA: Diagnosis not present

## 2020-12-17 LAB — POCT INR: INR: 2.3 (ref 2.0–3.0)

## 2020-12-17 NOTE — Patient Instructions (Signed)
Description   Take 1.5 tablets today, then resume same dosage 1 tablet daily except 1.5 tablets on Mondays and Thursdays. Recheck in 4 weeks. Call with any new medications or procedures [phone]

## 2020-12-18 NOTE — Progress Notes (Signed)
NEUROLOGY FOLLOW UP OFFICE NOTE  [name] [name] [id number]  Assessment/Plan:   Multiple sclerosis  1.  D3 4000 IU daily.  She is to have a vita in D level checked in June. 2.  Follow up one year.  Subjective:  [name] [name] a 70 year old right-handed Caucasian woman with COPD, CHF, mitral valve replacement, hypothyroidism and osteoporosis who follows up for multiple sclerosis.  UPDATE: Current DMT: None.  Other medications/supplements:  D3 4000 IU daily, biotin 5066mcg daily.  Overall doing well.  She has been going to physical therapy about twice a week.  Vision:Cataracts Motor: No weakness Sensory: No issues Pain: stable Gait: stable.  Uses walker. Bowel/Bladder: stable Fatigue: She feels she has a little more stamina. Cognition: No issues Mood: No issues  HISTORY: She was diagnosed with multiple sclerosis in 1994. At the time, she exhibited tingling in her hands as well as frequent falls. She saw a neurologist who performed an MRI of the brain that revealed white matter lesions. She underwent a lumbar puncture which reportedly demonstrated possible elevated IgG index. It was recommended to start Avonex, but she declined, fearing potential side effects. She has never been on disease modifying therapy. Mercury toxicity  She has not had any clear MS flares. She has chronic symptoms, such as falls, neurogenic bladder and occasional tingling in the left hand. She used to have muscle spasms in the right arm many years ago, which has resolved. Over the past several months, she has started to use a rolling walker due to increased gait difficulty which she attributes to her MS-related chronic gait instability, as well as arthritis and scoliosis. She currently is undergoing physical therapy and sees a Physiological scientist once a week. She has a urologist.  She has never had optic neuritis or focal/unilateral weakness.   Imaging: MRI of brain without  contrast was performed on 07/11/14 revealed supratentorial white matter lesions consistent with demyelinating disease as well as diffuse cortical atrophy.  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 She has sustained L2 and L5 compression fractures with height loss in the past due to her falls.  MRI of brain without contrast from 02/18/17 was personally reviewed and was stable compared to prior imaging from 07/11/14. It revealed multiple lesions in the periventricular and juxta cortical white matter, as well as within the right brachium pontis. MRI of cervical spine revealed thin appearance of cervical cord without demyelination.  MRI of brain without contrast from 12/15/2019 was stable compared to prior imaging from 02/18/2017.  PAST MEDICAL HISTORY: Past Medical History:  Diagnosis Date  . Anxiety   . Arthritis    knees  . Breast cancer ([place]) 1999  . CHF (congestive heart failure) ([place])    Related to severe mitral regurgitation, April, 2013  . 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 Assess by pulmonary team in the hospital April, 2013  . Ejection fraction    EF 60%, echo, April, 2013, with severe MR before mitral valve replacement  . Herpes   . Hypothyroidism   . IBS (irritable bowel syndrome)   . Mitral valve regurgitation    Mitral valve replacement April, 2013, Mitral valve prolapse  . Multiple sclerosis ([place])   . Neurogenic bladder   . Osteoporosis   . Ovarian cyst   . Paroxysmal atrial fibrillation (H[facility])    Rapid atrial fibrillation  in-hospital, Rapid cardioversion,  before mitral valve surgery  . Pulmonary hypertension ([place])    Echo, April, 2013, before mitral valve surgery  . S/P Maze operation for atrial fibrillation 01/26/2012   Complete biatrial lesion set using cryothermy via right mini  thoracotomy  . S/P mitral valve replacement 01/26/2012   83mm Sorin Carbomedics Optiform mechanical prosthesis via right mini thoracotomy  . Warfarin anticoagulation    Mechanical mitral prosthesis, April, 20136    MEDICATIONS: Current Outpatient Medications on File Prior to Visit  Medication Sig Dispense Refill  . amoxicillin (AMOXIL) 500 MG capsule take 4 capsules by mouth 1 hour prior to dental appointment  0  . Biotin 5000 MCG CAPS Take 20,000 mcg by mouth.     . Black Cohosh 40 MG CAPS Take 40 mg by mouth daily.    . Bromelains (BROMELAIN PO) Take 1 capsule by mouth 2 (two) times daily.    . calcium carbonate (OSCAL) 1500 (600 Ca) MG TABS tablet Takes 1 time daily    . Cholecalciferol (VITAMIN D3) 2000 units capsule Take 6,000 Units by mouth daily.     . Coenzyme Q10 (CO Q-10) 100 MG CAPS Take 1 capsule by mouth daily.     . Cranberry 500 MG CAPS Take 1 capsule by mouth daily.    Marland Kitchen L-THEANINE PO Take 1 capsule by mouth daily.     Marland Kitchen lactose free nutrition (BOOST PLUS) LIQD Take 237 mLs by mouth daily.     Marland Kitchen lidocaine-prilocaine (EMLA) cream Apply 1 application topically as needed (per pt this is for use before blood draws).   0  . lisinopril (ZESTRIL) 5 MG tablet Take 1 tablet (5 mg total) by mouth daily. 90 tablet 1  . Lysine 500 MG CAPS Take 1 capsule by mouth daily.    . Magnesium Citrate 200 MG TABS Take 200 mg by mouth daily as needed.    . metoprolol tartrate (LOPRESSOR) 25 MG tablet TAKE 1 TAB (25 MG) PO TWICE DAILY - pt must make appt with provider for further refills - 1st attempt 180 tablet 3  . MILK THISTLE PO Take 1 capsule by mouth 2 (two) times daily.    . Misc Natural Products (GLUCOSAMINE CHOND COMPLEX/MSM PO) Strength of dose Glucosamine 1500mg  /Chodroitron 1000mg / MSM 500mg  takes one twice daily    . NON FORMULARY 1 capsule 2 (two) times daily.    Jonna Coup Leaf 500 MG CAPS Take 1 capsule by mouth 2 (two) times daily.     . Petasin (PETADOLEX PO) Take by mouth 2 (two)  times daily.    . Probiotic Product (PROBIOTIC DAILY PO) Take 1 capsule once a day    . PROGESTERONE MICRONIZED PO Take 100 mg by mouth daily.     . Pumpkin Seed 500 MG TABS Take 1 tablet by mouth 2 (two) times daily.    Marland Kitchen thyroid (ARMOUR) 90 MG tablet Take 90 mg by mouth daily.    . TURMERIC PO Take by mouth.    . vitamin A 10000 UNIT capsule Take 10,000 Units by mouth daily.    . vitamin B-12 (CYANOCOBALAMIN) 1000 MCG tablet Take 1,000 mcg by mouth daily. Taking sublingal    . vitamin E 400 UNIT capsule Take 400 Units by mouth daily.    Marland Kitchen warfarin (COUMADIN) 5 MG tablet TAKE 1 TABLET DAILY EXCEPT 1.5 TABLETS ON MONDAY AND THURSDAY  BY MOUTH EVERY DAY OR AS DIRECTED BY COUMADIN CLINIC 110 tablet 0   No current  facility-administered medications on file prior to visit.    ALLERGIES: Allergies  Allergen Reactions  . Erythromycin Nausea And Vomiting  . Valsartan Other (See Comments)    Pt reports caused her depression    FAMILY HISTORY: Family History  Problem Relation Age of Onset  . Heart disease Father        cardiac arrest   . CAD Father   . Hypertension Father   . CAD Mother        5 stents and numerous bypass surgery  . Hypertension Mother   . Hyperlipidemia Mother   . CAD Other   . Breast cancer Maternal Grandmother   . Breast cancer Maternal Aunt      Objective:  Blood pressure 133/71, pulse 66, height 5\' 7"  (1.702 m), weight 148 lb (67.1 kg), SpO2 95 %. General: No acute distress.  Patient appears well-groomed.   Head:  Normocephalic/atraumatic Eyes:  Fundi examined but not visualized Neck: supple, no paraspinal tenderness, full range of motion Heart:  Regular rate and rhythm Lungs:  Clear to auscultation bilaterally Back: No paraspinal tenderness Neurological Exam: alert and oriented to person, place, and time; recent and remote memory intact.  Speech fluent and not dysarthric, language intact.  CN II-XII intact. Decreased bulk in all extremities, normal tone;  Muscle strength 4+/5 bilateral hip flexion, 5-/5 knee extension/flexion, otherwise muscle strength 5/5 throughout.  Sensation to light touch intact.  Deep tendon reflexes 2+ throughout, bilateral Babinski.  Finger-to-nose testing intact. Wide-based shuffling gait.  Ambulates with walker.     Metta Clines, DO  CC:  Howard Pouch, DO

## 2020-12-19 ENCOUNTER — Encounter: Payer: Self-pay | Admitting: Neurology

## 2020-12-19 ENCOUNTER — Ambulatory Visit (INDEPENDENT_AMBULATORY_CARE_PROVIDER_SITE_OTHER): Payer: Medicare Other | Admitting: Neurology

## 2020-12-19 ENCOUNTER — Other Ambulatory Visit: Payer: Self-pay

## 2020-12-19 VITALS — BP 133/71 | HR 66 | Ht 67.0 in | Wt 148.0 lb

## 2020-12-19 DIAGNOSIS — G35 Multiple sclerosis: Secondary | ICD-10-CM

## 2020-12-19 NOTE — Patient Instructions (Signed)
Continue vit D 4000 IU daily

## 2020-12-30 DIAGNOSIS — N951 Menopausal and female climacteric states: Secondary | ICD-10-CM | POA: Diagnosis not present

## 2021-01-06 DIAGNOSIS — N951 Menopausal and female climacteric states: Secondary | ICD-10-CM | POA: Diagnosis not present

## 2021-01-11 ENCOUNTER — Inpatient Hospital Stay (HOSPITAL_COMMUNITY)
Admission: EM | Admit: 2021-01-11 | Discharge: 2021-01-13 | DRG: 065 | Disposition: A | Discharge status: Rehabilitation Facility | Payer: Medicare Other | Attending: Internal Medicine | Admitting: Internal Medicine

## 2021-01-11 ENCOUNTER — Emergency Department (HOSPITAL_COMMUNITY): Payer: Medicare Other

## 2021-01-11 ENCOUNTER — Other Ambulatory Visit: Payer: Self-pay

## 2021-01-11 DIAGNOSIS — I69351 Hemiplegia and hemiparesis following cerebral infarction affecting right dominant side: Secondary | ICD-10-CM | POA: Diagnosis not present

## 2021-01-11 DIAGNOSIS — Z888 Allergy status to other drugs, medicaments and biological substances status: Secondary | ICD-10-CM

## 2021-01-11 DIAGNOSIS — Z8249 Family history of ischemic heart disease and other diseases of the circulatory system: Secondary | ICD-10-CM

## 2021-01-11 DIAGNOSIS — M810 Age-related osteoporosis without current pathological fracture: Secondary | ICD-10-CM | POA: Diagnosis present

## 2021-01-11 DIAGNOSIS — I491 Atrial premature depolarization: Secondary | ICD-10-CM | POA: Diagnosis not present

## 2021-01-11 DIAGNOSIS — R42 Dizziness and giddiness: Secondary | ICD-10-CM | POA: Diagnosis not present

## 2021-01-11 DIAGNOSIS — I639 Cerebral infarction, unspecified: Secondary | ICD-10-CM | POA: Diagnosis not present

## 2021-01-11 DIAGNOSIS — Z79899 Other long term (current) drug therapy: Secondary | ICD-10-CM

## 2021-01-11 DIAGNOSIS — J439 Emphysema, unspecified: Secondary | ICD-10-CM | POA: Diagnosis present

## 2021-01-11 DIAGNOSIS — Z952 Presence of prosthetic heart valve: Secondary | ICD-10-CM | POA: Diagnosis not present

## 2021-01-11 DIAGNOSIS — Z79890 Hormone replacement therapy (postmenopausal): Secondary | ICD-10-CM | POA: Diagnosis not present

## 2021-01-11 DIAGNOSIS — I493 Ventricular premature depolarization: Secondary | ICD-10-CM | POA: Diagnosis present

## 2021-01-11 DIAGNOSIS — R404 Transient alteration of awareness: Secondary | ICD-10-CM | POA: Diagnosis not present

## 2021-01-11 DIAGNOSIS — J449 Chronic obstructive pulmonary disease, unspecified: Secondary | ICD-10-CM | POA: Diagnosis not present

## 2021-01-11 DIAGNOSIS — R29818 Other symptoms and signs involving the nervous system: Secondary | ICD-10-CM | POA: Diagnosis not present

## 2021-01-11 DIAGNOSIS — R29718 NIHSS score 18: Secondary | ICD-10-CM | POA: Diagnosis present

## 2021-01-11 DIAGNOSIS — I613 Nontraumatic intracerebral hemorrhage in brain stem: Secondary | ICD-10-CM | POA: Diagnosis present

## 2021-01-11 DIAGNOSIS — I5032 Chronic diastolic (congestive) heart failure: Secondary | ICD-10-CM | POA: Diagnosis not present

## 2021-01-11 DIAGNOSIS — I110 Hypertensive heart disease with heart failure: Secondary | ICD-10-CM | POA: Diagnosis not present

## 2021-01-11 DIAGNOSIS — E039 Hypothyroidism, unspecified: Secondary | ICD-10-CM | POA: Diagnosis present

## 2021-01-11 DIAGNOSIS — I6389 Other cerebral infarction: Secondary | ICD-10-CM | POA: Diagnosis not present

## 2021-01-11 DIAGNOSIS — I6501 Occlusion and stenosis of right vertebral artery: Secondary | ICD-10-CM | POA: Diagnosis present

## 2021-01-11 DIAGNOSIS — Z853 Personal history of malignant neoplasm of breast: Secondary | ICD-10-CM | POA: Diagnosis not present

## 2021-01-11 DIAGNOSIS — G35 Multiple sclerosis: Secondary | ICD-10-CM | POA: Diagnosis present

## 2021-01-11 DIAGNOSIS — M50223 Other cervical disc displacement at C6-C7 level: Secondary | ICD-10-CM | POA: Diagnosis not present

## 2021-01-11 DIAGNOSIS — I272 Pulmonary hypertension, unspecified: Secondary | ICD-10-CM | POA: Diagnosis present

## 2021-01-11 DIAGNOSIS — Z7982 Long term (current) use of aspirin: Secondary | ICD-10-CM | POA: Diagnosis not present

## 2021-01-11 DIAGNOSIS — Z9181 History of falling: Secondary | ICD-10-CM

## 2021-01-11 DIAGNOSIS — I10 Essential (primary) hypertension: Secondary | ICD-10-CM | POA: Diagnosis not present

## 2021-01-11 DIAGNOSIS — Z7901 Long term (current) use of anticoagulants: Secondary | ICD-10-CM | POA: Diagnosis not present

## 2021-01-11 DIAGNOSIS — N319 Neuromuscular dysfunction of bladder, unspecified: Secondary | ICD-10-CM | POA: Diagnosis present

## 2021-01-11 DIAGNOSIS — R4701 Aphasia: Secondary | ICD-10-CM | POA: Diagnosis present

## 2021-01-11 DIAGNOSIS — I480 Paroxysmal atrial fibrillation: Secondary | ICD-10-CM | POA: Diagnosis present

## 2021-01-11 DIAGNOSIS — G8191 Hemiplegia, unspecified affecting right dominant side: Secondary | ICD-10-CM | POA: Diagnosis present

## 2021-01-11 DIAGNOSIS — N390 Urinary tract infection, site not specified: Secondary | ICD-10-CM | POA: Diagnosis not present

## 2021-01-11 DIAGNOSIS — N3289 Other specified disorders of bladder: Secondary | ICD-10-CM | POA: Diagnosis present

## 2021-01-11 DIAGNOSIS — R29810 Facial weakness: Secondary | ICD-10-CM | POA: Diagnosis not present

## 2021-01-11 DIAGNOSIS — I69320 Aphasia following cerebral infarction: Secondary | ICD-10-CM | POA: Diagnosis not present

## 2021-01-11 DIAGNOSIS — R9431 Abnormal electrocardiogram [ECG] [EKG]: Secondary | ICD-10-CM | POA: Diagnosis not present

## 2021-01-11 DIAGNOSIS — R531 Weakness: Secondary | ICD-10-CM | POA: Diagnosis not present

## 2021-01-11 DIAGNOSIS — M170 Bilateral primary osteoarthritis of knee: Secondary | ICD-10-CM | POA: Diagnosis present

## 2021-01-11 DIAGNOSIS — M47813 Spondylosis without myelopathy or radiculopathy, cervicothoracic region: Secondary | ICD-10-CM | POA: Diagnosis not present

## 2021-01-11 DIAGNOSIS — E785 Hyperlipidemia, unspecified: Secondary | ICD-10-CM | POA: Diagnosis not present

## 2021-01-11 DIAGNOSIS — M436 Torticollis: Secondary | ICD-10-CM | POA: Diagnosis not present

## 2021-01-11 DIAGNOSIS — Z20822 Contact with and (suspected) exposure to covid-19: Secondary | ICD-10-CM | POA: Diagnosis present

## 2021-01-11 DIAGNOSIS — R799 Abnormal finding of blood chemistry, unspecified: Secondary | ICD-10-CM | POA: Diagnosis not present

## 2021-01-11 DIAGNOSIS — Z881 Allergy status to other antibiotic agents status: Secondary | ICD-10-CM

## 2021-01-11 DIAGNOSIS — M47812 Spondylosis without myelopathy or radiculopathy, cervical region: Secondary | ICD-10-CM | POA: Diagnosis not present

## 2021-01-11 DIAGNOSIS — M50222 Other cervical disc displacement at C5-C6 level: Secondary | ICD-10-CM | POA: Diagnosis not present

## 2021-01-11 DIAGNOSIS — K5901 Slow transit constipation: Secondary | ICD-10-CM | POA: Diagnosis present

## 2021-01-11 LAB — DIFFERENTIAL
Abs Immature Granulocytes: 0.01 10*3/uL (ref 0.00–0.07)
Basophils Absolute: 0 10*3/uL (ref 0.0–0.1)
Basophils Relative: 1 %
Eosinophils Absolute: 0.1 10*3/uL (ref 0.0–0.5)
Eosinophils Relative: 1 %
Immature Granulocytes: 0 %
Lymphocytes Relative: 35 %
Lymphs Abs: 2.2 10*3/uL (ref 0.7–4.0)
Monocytes Absolute: 0.6 10*3/uL (ref 0.1–1.0)
Monocytes Relative: 10 %
Neutro Abs: 3.5 10*3/uL (ref 1.7–7.7)
Neutrophils Relative %: 53 %

## 2021-01-11 LAB — COMPREHENSIVE METABOLIC PANEL
ALT: 20 U/L (ref 0–44)
AST: 25 U/L (ref 15–41)
Albumin: 3.3 g/dL — ABNORMAL LOW (ref 3.5–5.0)
Alkaline Phosphatase: 72 U/L (ref 38–126)
Anion gap: 11 (ref 5–15)
BUN: 27 mg/dL — ABNORMAL HIGH (ref 8–23)
CO2: 25 mmol/L (ref 22–32)
Calcium: 9.1 mg/dL (ref 8.9–10.3)
Chloride: 101 mmol/L (ref 98–111)
Creatinine, Ser: 0.78 mg/dL (ref 0.44–1.00)
GFR, Estimated: >60 mL/min (ref >60)
Glucose, Bld: 146 mg/dL — ABNORMAL HIGH (ref 70–99)
Potassium: 3.6 mmol/L (ref 3.5–5.1)
Sodium: 137 mmol/L (ref 135–145)
Total Bilirubin: 0.8 mg/dL (ref 0.3–1.2)
Total Protein: 6.8 g/dL (ref 6.5–8.1)

## 2021-01-11 LAB — I-STAT CHEM 8, ED
BUN: 27 mg/dL — ABNORMAL HIGH (ref 8–23)
Calcium, Ion: 1.04 mmol/L — ABNORMAL LOW (ref 1.15–1.40)
Chloride: 102 mmol/L (ref 98–111)
Creatinine, Ser: 0.7 mg/dL (ref 0.44–1.00)
Glucose, Bld: 143 mg/dL — ABNORMAL HIGH (ref 70–99)
HCT: 41 % (ref 36.0–46.0)
Hemoglobin: 13.9 g/dL (ref 12.0–15.0)
Potassium: 3.5 mmol/L (ref 3.5–5.1)
Sodium: 138 mmol/L (ref 135–145)
TCO2: 25 mmol/L (ref 22–32)

## 2021-01-11 LAB — URINALYSIS, ROUTINE W REFLEX MICROSCOPIC
Bilirubin Urine: NEGATIVE
Glucose, UA: NEGATIVE mg/dL
Ketones, ur: 20 mg/dL — AB
Nitrite: POSITIVE — AB
Protein, ur: NEGATIVE mg/dL
Specific Gravity, Urine: 1.042 — ABNORMAL HIGH (ref 1.005–1.030)
pH: 6 (ref 5.0–8.0)

## 2021-01-11 LAB — TSH: TSH: 0.371 u[IU]/mL (ref 0.350–4.500)

## 2021-01-11 LAB — CBC
HCT: 42.9 % (ref 36.0–46.0)
Hemoglobin: 14.3 g/dL (ref 12.0–15.0)
MCH: 31.8 pg (ref 26.0–34.0)
MCHC: 33.3 g/dL (ref 30.0–36.0)
MCV: 95.3 fL (ref 80.0–100.0)
Platelets: 263 10*3/uL (ref 150–400)
RBC: 4.5 MIL/uL (ref 3.87–5.11)
RDW: 13.1 % (ref 11.5–15.5)
WBC: 6.5 10*3/uL (ref 4.0–10.5)
nRBC: 0 % (ref 0.0–0.2)

## 2021-01-11 LAB — PROTIME-INR
INR: 2.2 — ABNORMAL HIGH (ref 0.8–1.2)
Prothrombin Time: 23.8 seconds — ABNORMAL HIGH (ref 11.4–15.2)

## 2021-01-11 LAB — RESP PANEL BY RT-PCR (FLU A&B, COVID) ARPGX2
Influenza A by PCR: NEGATIVE
Influenza B by PCR: NEGATIVE
SARS Coronavirus 2 by RT PCR: NEGATIVE

## 2021-01-11 LAB — APTT: aPTT: 26 seconds (ref 24–36)

## 2021-01-11 LAB — CBG MONITORING, ED: Glucose-Capillary: 89 mg/dL (ref 70–99)

## 2021-01-11 MED ORDER — STROKE: EARLY STAGES OF RECOVERY BOOK
Freq: Once | Status: AC
  Filled 2021-01-11: qty 1

## 2021-01-11 MED ORDER — THYROID 60 MG PO TABS
90.0000 mg | ORAL_TABLET | Freq: Every day | ORAL | Status: DC
  Administered 2021-01-12 – 2021-01-13 (×2): 90 mg via ORAL
  Filled 2021-01-11 (×2): qty 1

## 2021-01-11 MED ORDER — ACETAMINOPHEN 325 MG PO TABS: 650.0000 mg | ORAL_TABLET | ORAL | Status: DC | PRN

## 2021-01-11 MED ORDER — LIDOCAINE-PRILOCAINE 2.5-2.5 % EX CREA: 1.0000 "application " | TOPICAL_CREAM | CUTANEOUS | Status: DC | PRN

## 2021-01-11 MED ORDER — CALCIUM CARBONATE 1250 (500 CA) MG PO TABS
1.0000 | ORAL_TABLET | Freq: Every day | ORAL | Status: DC
  Administered 2021-01-12 – 2021-01-13 (×2): 500 mg via ORAL
  Filled 2021-01-11 (×2): qty 1

## 2021-01-11 MED ORDER — CRANBERRY 500 MG PO CAPS: 1.0000 | ORAL_CAPSULE | Freq: Every day | ORAL | Status: DC

## 2021-01-11 MED ORDER — SODIUM CHLORIDE 0.9% FLUSH
3.0000 mL | Freq: Once | INTRAVENOUS | Status: AC
Start: 2021-01-11 — End: 2021-01-11
  Administered 2021-01-11: 3 mL via INTRAVENOUS

## 2021-01-11 MED ORDER — CO Q-10 100 MG PO CAPS: 1.0000 | ORAL_CAPSULE | Freq: Every day | ORAL | Status: DC

## 2021-01-11 MED ORDER — GADOBUTROL 1 MMOL/ML IV SOLN
7.0000 mL | Freq: Once | INTRAVENOUS | Status: AC | PRN
  Administered 2021-01-11: 7 mL via INTRAVENOUS

## 2021-01-11 MED ORDER — MECLIZINE HCL 12.5 MG PO TABS
12.5000 mg | ORAL_TABLET | Freq: Three times a day (TID) | ORAL | Status: DC
  Administered 2021-01-11 – 2021-01-13 (×6): 12.5 mg via ORAL
  Filled 2021-01-11 (×6): qty 1

## 2021-01-11 MED ORDER — WARFARIN SODIUM 7.5 MG PO TABS
7.5000 mg | ORAL_TABLET | Freq: Once | ORAL | Status: AC
  Administered 2021-01-11: 7.5 mg via ORAL
  Filled 2021-01-11 (×2): qty 1

## 2021-01-11 MED ORDER — WARFARIN - PHARMACIST DOSING INPATIENT: Freq: Every day | Status: DC

## 2021-01-11 MED ORDER — BROMELAIN 250 MG PO CAPS: ORAL_CAPSULE | Freq: Two times a day (BID) | ORAL | Status: DC

## 2021-01-11 MED ORDER — SODIUM CHLORIDE 0.9 % IV SOLN: INTRAVENOUS | Status: AC

## 2021-01-11 MED ORDER — ACETAMINOPHEN 650 MG RE SUPP: 650.0000 mg | RECTAL | Status: DC | PRN

## 2021-01-11 MED ORDER — SENNOSIDES-DOCUSATE SODIUM 8.6-50 MG PO TABS: 1.0000 | ORAL_TABLET | Freq: Every evening | ORAL | Status: DC | PRN

## 2021-01-11 MED ORDER — ACETAMINOPHEN 160 MG/5ML PO SOLN: 650.0000 mg | ORAL | Status: DC | PRN

## 2021-01-11 MED ORDER — BIOTIN 5000 MCG PO CAPS: 20000.0000 ug | ORAL_CAPSULE | Freq: Every day | ORAL | Status: DC

## 2021-01-11 MED ORDER — VITAMIN D 25 MCG (1000 UNIT) PO TABS
6000.0000 [IU] | ORAL_TABLET | Freq: Every day | ORAL | Status: DC
  Administered 2021-01-12 – 2021-01-13 (×2): 6000 [IU] via ORAL
  Filled 2021-01-11 (×2): qty 6

## 2021-01-11 MED ORDER — METOPROLOL TARTRATE 25 MG PO TABS
25.0000 mg | ORAL_TABLET | Freq: Two times a day (BID) | ORAL | Status: DC
  Administered 2021-01-11 – 2021-01-13 (×4): 25 mg via ORAL
  Filled 2021-01-11 (×4): qty 1

## 2021-01-11 MED ORDER — IOHEXOL 350 MG/ML SOLN
80.0000 mL | Freq: Once | INTRAVENOUS | Status: AC | PRN
  Administered 2021-01-11: 80 mL via INTRAVENOUS

## 2021-01-11 NOTE — ED Notes (Signed)
Patient transported to MRI 

## 2021-01-11 NOTE — H&P (Signed)
History and Physical    [name] [name] NTI:[id number] DOB: [birth year] DOA: 01/11/2021  PCP: Ma Hillock, DO (Confirm with patient/family/NH records and if not entered, this has to be entered at [facility] point of entry) Patient coming from: Home  I have personally briefly reviewed patient's old medical records in [place]  Chief Complaint: Feeling dizzy, speech problem, right-sided weakness  HPI: [name] [name] is a 70 y.o. female with medical history significant severe mitral regurgitation status post metal mitral valve replacement, PAF, HTN, COPD, osteoporosis, remote history of MS, presented with new onset of speech problem, right-sided weakness and feeling dizzy.    Patient woke up this morning, feeling dizzy which persistent till lunch time when she called her neighbor, and then she started to feel having trouble to talk, "just can not talk out" and she also had trouble lifting her right arm. Neighbor called EMS.  Patient does report that her INR has been subtherapeutic for about a month, and cardiology has been increasing her Coumadin, instead of taking Coumadin 5 mg daily, on Monday and Thursday she takes 7.5 mg.  ED Course: MRI suspect acute left pons infarct.  Patient speech problem improved along with right-sided is however complaining dizziness persistent.  Review of Systems: As per HPI otherwise 14 point review of systems negative.    Past Medical History:  Diagnosis Date  . Anxiety   . Arthritis    knees  . Breast cancer ([place]) 1999  . CHF (congestive heart failure) ([place])    Related to severe mitral regurgitation, April, 2013  . 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 Assess by pulmonary team in the hospital April, 2013  . Ejection fraction    EF 60%, echo, April, 2013, with severe MR before mitral valve replacement  . Herpes   . Hypothyroidism   . IBS (irritable bowel syndrome)   . Mitral valve regurgitation    Mitral valve  replacement April, 2013, Mitral valve prolapse  . Multiple sclerosis ([place])   . Neurogenic bladder   . Osteoporosis   . Ovarian cyst   . Paroxysmal atrial fibrillation (HCC)    Rapid atrial fibrillation in-hospital, Rapid cardioversion,  before mitral valve surgery  . Pulmonary hypertension ([place])    Echo, April, 2013, before mitral valve surgery  . S/P Maze operation for atrial fibrillation 01/26/2012   Complete biatrial lesion set using cryothermy via right mini thoracotomy  . S/P mitral valve replacement 01/26/2012   35mm Sorin [employer] Optiform mechanical prosthesis via right mini thoracotomy  . Warfarin anticoagulation    Mechanical mitral prosthesis, April, 20136    Past Surgical History:  Procedure Laterality Date  . BREAST LUMPECTOMY Right 1999   with sent.node, and axillary dissection (20)  . CHEST TUBE INSERTION  01/26/2012   Procedure: CHEST TUBE INSERTION;  Surgeon: Rexene Alberts, MD;  Location: [place];  Service: Open Heart Surgery;  Laterality: Left;  . COLONOSCOPY  2010   "normal"  . CYSTOSCOPY  1992  . LAPAROSCOPIC OVARIAN CYSTECTOMY  1978   urethral stricture repair  . LEFT AND RIGHT HEART CATHETERIZATION WITH CORONARY ANGIOGRAM N/A 01/20/2012   Procedure: LEFT AND RIGHT HEART CATHETERIZATION WITH CORONARY ANGIOGRAM;  Surgeon: Burnell Blanks, MD;  Location[facility] CATH LAB;  Service: Cardiovascular;  Laterality: N/A;  . LYMPHADENECTOMY    . MAZE  01/26/2012   Procedure: MAZE;  Surgeon: Rexene Alberts, MD;  Location: [place];  Service: Open Heart Surgery;  Laterality: N/A;  . MITRAL VALVE REPLACEMENT  01/26/2012   Procedure: MINIMALLY INVASIVE MITRAL VALVE (MV) REPLACEMENT;  Surgeon: Rexene Alberts, MD;  Location: [place];  Service: Open Heart Surgery;  Laterality: Right;  . TEE WITHOUT CARDIOVERSION  01/19/2012   Procedure: TRANSESOPHAGEAL ECHOCARDIOGRAM (TEE);  Surgeon: Peter M Martinique, MD;  Location: [facility] ENDOSCOPY;  Service: Cardiovascular;  Laterality: N/A;  .  TONSILLECTOMY  1970  . [place]  . WRIST SURGERY Right 2012     reports that she has never smoked. She has never used smokeless tobacco. She reports that she does not drink alcohol and does not use drugs.  Allergies  Allergen Reactions  . Erythromycin Nausea And Vomiting  . Valsartan Other (See Comments)    Pt reports caused her depression    Family History  Problem Relation Age of Onset  . Heart disease Father        cardiac arrest   . CAD Father   . Hypertension Father   . CAD Mother        5 stents and numerous bypass surgery  . Hypertension Mother   . Hyperlipidemia Mother   . CAD Other   . Breast cancer Maternal Grandmother   . Breast cancer Maternal Aunt      Prior to Admission medications   Medication Sig Start Date End Date Taking? Authorizing Provider  amoxicillin (AMOXIL) 500 MG capsule take 4 capsules by mouth 1 hour prior to dental appointment 12/06/17   [provider]  ARMOUR THYROID 90 MG tablet Take 90 mg by mouth daily.    [provider]  Biotin 5000 MCG CAPS Take 20,000 mcg by mouth.     [provider]  Black Cohosh 40 MG CAPS Take 40 mg by mouth daily.    [provider]  Bromelains (BROMELAIN PO) Take 1 capsule by mouth 2 (two) times daily.    [provider]  calcium carbonate (OSCAL) 1500 (600 Ca) MG TABS tablet Takes 1 time daily    [provider]  Cholecalciferol (VITAMIN D3) 2000 units capsule Take 6,000 Units by mouth daily.     [provider]  Coenzyme Q10 (CO Q-10) 100 MG CAPS Take 1 capsule by mouth daily.     [provider]  Cranberry 500 MG CAPS Take 1 capsule by mouth daily.    [provider]  L-THEANINE PO Take 1 capsule by mouth daily.     [provider]  lactose free nutrition (BOOST PLUS) LIQD Take 237 mLs by mouth daily.     [provider]  lidocaine-prilocaine (EMLA) cream Apply 1 application topically as needed  (per pt this is for use before blood draws).  03/06/15   [provider]  lisinopril (ZESTRIL) 5 MG tablet Take 1 tablet (5 mg total) by mouth daily. 07/25/20   Dorothy Spark, MD  Lysine 500 MG CAPS Take 1 capsule by mouth daily.    [provider]  Magnesium Citrate 200 MG TABS Take 200 mg by mouth daily as needed.    [provider]  metoprolol tartrate (LOPRESSOR) 25 MG tablet TAKE 1 TAB (25 MG) PO TWICE DAILY - pt must make appt with provider for further refills - 1st attempt Patient taking differently: Take 25 mg by mouth 2 (two) times daily. 03/29/20   Dorothy Spark, MD  MILK THISTLE PO Take 1 capsule by mouth 2 (two) times daily.    [provider]  Misc Natural Products (GLUCOSAMINE CHOND COMPLEX/MSM PO) Strength of dose Glucosamine 1500mg  /Chodroitron 1000mg / MSM 500mg  takes one twice daily    [provider]  NON FORMULARY 1 capsule 2 (two) times daily.    [provider]  Olive Leaf 500 MG CAPS Take 1 capsule by mouth 2 (two) times daily.     [provider]  Petasin (PETADOLEX PO) Take by mouth 2 (two) times daily.    [provider]  Probiotic Product (PROBIOTIC DAILY PO) Take 1 capsule once a day    [provider]  PROGESTERONE MICRONIZED PO Take 100 mg by mouth daily.     [provider]  Pumpkin Seed 500 MG TABS Take 1 tablet by mouth 2 (two) times daily.    [provider]  TURMERIC PO Take by mouth.    [provider]  vitamin A 10000 UNIT capsule Take 10,000 Units by mouth daily.    [provider]  vitamin B-12 (CYANOCOBALAMIN) 1000 MCG tablet Take 1,000 mcg by mouth daily. Taking sublingal    [provider]  vitamin E 400 UNIT capsule Take 400 Units by mouth daily.    [provider]  warfarin (COUMADIN) 5 MG tablet TAKE 1 TABLET DAILY EXCEPT 1.5 TABLETS ON MONDAY AND THURSDAY  BY MOUTH EVERY DAY OR AS DIRECTED BY COUMADIN CLINIC  11/14/20   Dorothy Spark, MD    Physical Exam: Vitals:   01/11/21 1815 01/11/21 1845 01/11/21 1900 01/11/21 1915  BP: 133/68 135/60 (!) 133/59 (!) 141/59  Pulse: 87 85 89 93  Resp: 19 (!) 8 12 10   Temp:      TempSrc:      SpO2: 99% 94% 91% 96%  Weight:      Height:        Constitutional: NAD, calm, comfortable Vitals:   01/11/21 1815 01/11/21 1845 01/11/21 1900 01/11/21 1915  BP: 133/68 135/60 (!) 133/59 (!) 141/59  Pulse: 87 85 89 93  Resp: 19 (!) 8 12 10   Temp:      TempSrc:      SpO2: 99% 94% 91% 96%  Weight:      Height:       Eyes: PERRL, lids and conjunctivae normal ENMT: Mucous membranes are moist. Posterior pharynx clear of any exudate or lesions.Normal dentition.  Neck: normal, supple, no masses, no thyromegaly Respiratory: clear to auscultation bilaterally, no wheezing, no crackles. Normal respiratory effort. No accessory muscle use.  Cardiovascular: Regular rate and rhythm, no murmurs / rubs / gallops. No extremity edema. 2+ pedal pulses. No carotid bruits.  Abdomen: no tenderness, no masses palpated. No hepatosplenomegaly. Bowel sounds positive.  Musculoskeletal: no clubbing / cyanosis. No joint deformity upper and lower extremities. Good ROM, no contractures. Normal muscle tone.  Skin: no rashes, lesions, ulcers. No induration Neurologic: No facial droop or tongue deviation, Sensation intact, DTR normal. Strength 4/5 on right arm compared to left arm 5/5.  Psychiatric: Normal judgment and insight. Alert and oriented x 3. Normal mood.     Labs on Admission: I have personally reviewed following labs and imaging studies  CBC: Recent Labs  Lab 01/11/21 1427 01/11/21 1440  WBC  --  6.5  NEUTROABS  --  3.5  HGB 13.9 14.3  HCT 41.0 42.9  MCV  --  95.3  PLT  --  093   Basic Metabolic Panel: Recent Labs  Lab 01/11/21 1427 01/11/21 1440  NA 138 137  K 3.5 3.6  CL 102  101  CO2  --  25  GLUCOSE 143* 146*  BUN 27* 27*  CREATININE 0.70 0.78   CALCIUM  --  9.1   GFR: Estimated Creatinine Clearance: 62.7 mL/min (by C-G formula based on SCr of 0.78 mg/dL). Liver Function Tests: Recent Labs  Lab 01/11/21 1440  AST 25  ALT 20  ALKPHOS 72  BILITOT 0.8  PROT 6.8  ALBUMIN 3.3*   No results for input(s): LIPASE, AMYLASE in the last 168 hours. No results for input(s): AMMONIA in the last 168 hours. Coagulation Profile: Recent Labs  Lab 01/11/21 1440  INR 2.2*   Cardiac Enzymes: No results for input(s): CKTOTAL, CKMB, CKMBINDEX, TROPONINI in the last 168 hours. BNP (last 3 results) No results for input(s): PROBNP in the last 8760 hours. HbA1C: No results for input(s): HGBA1C in the last 72 hours. CBG: Recent Labs  Lab 01/11/21 1421  GLUCAP 89   Lipid Profile: No results for input(s): CHOL, HDL, LDLCALC, TRIG, CHOLHDL, LDLDIRECT in the last 72 hours. Thyroid Function Tests: No results for input(s): TSH, T4TOTAL, FREET4, T3FREE, THYROIDAB in the last 72 hours. Anemia Panel: No results for input(s): VITAMINB12, FOLATE, FERRITIN, TIBC, IRON, RETICCTPCT in the last 72 hours. Urine analysis:    Component Value Date/Time   COLORURINE YELLOW 01/11/2021 1904   APPEARANCEUR HAZY (A) 01/11/2021 1904   LABSPEC 1.042 (H) 01/11/2021 1904   PHURINE 6.0 01/11/2021 1904   GLUCOSEU NEGATIVE 01/11/2021 1904   HGBUR SMALL (A) 01/11/2021 1904   BILIRUBINUR NEGATIVE 01/11/2021 1904   KETONESUR 20 (A) 01/11/2021 1904   PROTEINUR NEGATIVE 01/11/2021 1904   UROBILINOGEN 2.0 (H) 02/01/2012 1953   NITRITE POSITIVE (A) 01/11/2021 1904   LEUKOCYTESUR TRACE (A) 01/11/2021 1904    Radiological Exams on Admission: CT ANGIO HEAD W OR WO CONTRAST  Result Date: 01/11/2021 CLINICAL DATA:  Code stroke EXAM: CT HEAD WITHOUT CONTRAST CT ANGIOGRAPHY OF THE HEAD AND NECK TECHNIQUE: Contiguous axial images were obtained from the base of the skull through the vertex without intravenous contrast. Multidetector CT imaging of the head and neck was  performed using the standard protocol during bolus administration of intravenous contrast. Multiplanar CT image reconstructions and MIPs were obtained to evaluate the vascular anatomy. Carotid stenosis measurements (when applicable) are obtained utilizing NASCET criteria, using the distal internal carotid diameter as the denominator. CONTRAST:  71mL OMNIPAQUE IOHEXOL 350 MG/ML SOLN COMPARISON:  None. FINDINGS: CT HEAD Brain: No acute intracranial hemorrhage, mass effect, or edema. Gray-white differentiation is preserved. Patchy hypoattenuation in the supratentorial white matter is nonspecific but probably reflects chronic microvascular ischemic changes. Prominence of the ventricles and sulci reflects generalized parenchymal volume loss. No extra-axial collection. Vascular: No hyperdense vessel. Skull: Unremarkable. Sinuses/Orbits: No acute abnormality. Other: Mastoid air cells are clear. ASPECTS ([place] Stroke Program Early CT Score) - Ganglionic level infarction (caudate, lentiform nuclei, internal capsule, insula, M1-M3 cortex): 7 - Supraganglionic infarction (M4-M6 cortex): 3 Total score (0-10 with 10 being normal): 10 CTA NECK Aortic arch: Great vessel origins are patent. There is mixed plaque along the innominate extending into the subclavian origin with less than 50% stenosis. Right carotid system: Patent. Atherosclerotic wall thickening with eccentric plaque along the proximal common carotid causing less than 50% stenosis. Minimal calcified plaque at the bifurcation. No stenosis at the ICA origin. Left carotid system: Patent. Mild calcified plaque at the ICA origin without stenosis. Vertebral arteries:Left vertebral artery is patent. Right V1 vertebral artery is not opacified. There is faint enhancement of the right  V2 and V3 segments. Skeleton: Degenerative changes of the cervical spine. Other neck: Scarring at the right lung apex with traction bronchiectasis. CTA HEAD Anterior circulation: Intracranial  internal carotid arteries are patent with minimal calcified plaque. Anterior and middle cerebral arteries are patent. Posterior circulation: Intracranial vertebral arteries are patent. Improved opacification of the right vertebral artery beyond the PICA origin probably related to retrograde flow. Basilar artery is patent. Posterior cerebral arteries are patent. Bilateral posterior communicating arteries are present, right larger than left. Venous sinuses: Not well evaluated. IMPRESSION: There is no acute intracranial hemorrhage or evidence of acute infarction. ASPECT score is 10. 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 Patent intracranially with greater flow beyond PICA origin likely from retrograde flow. Otherwise, no hemodynamically significant stenosis. Initial results were communicated to Dr. Cheral Marker at 2:39 p.m. on 01/11/2021 by text page via the [facility] messaging system. Electronically Signed   By: Macy Mis M.D.   On: 01/11/2021 14:50   CT ANGIO NECK W OR WO CONTRAST  Result Date: 01/11/2021 CLINICAL DATA:  Code stroke EXAM: CT HEAD WITHOUT CONTRAST CT ANGIOGRAPHY OF THE HEAD AND NECK TECHNIQUE: Contiguous axial images were obtained from the base of the skull through the vertex without intravenous contrast. Multidetector CT imaging of the head and neck was performed using the standard protocol during bolus administration of intravenous contrast. Multiplanar CT image reconstructions and MIPs were obtained to evaluate the vascular anatomy. Carotid stenosis measurements (when applicable) are obtained utilizing NASCET criteria, using the distal internal carotid diameter as the denominator. CONTRAST:  58mL OMNIPAQUE IOHEXOL 350 MG/ML SOLN COMPARISON:  None. FINDINGS: CT HEAD Brain: No acute intracranial hemorrhage, mass effect, or edema. Gray-white differentiation is preserved. Patchy hypoattenuation  in the supratentorial white matter is nonspecific but probably reflects chronic microvascular ischemic changes. Prominence of the ventricles and sulci reflects generalized parenchymal volume loss. No extra-axial collection. Vascular: No hyperdense vessel. Skull: Unremarkable. Sinuses/Orbits: No acute abnormality. Other: Mastoid air cells are clear. ASPECTS ([place] Stroke Program Early CT Score) - Ganglionic level infarction (caudate, lentiform nuclei, internal capsule, insula, M1-M3 cortex): 7 - Supraganglionic infarction (M4-M6 cortex): 3 Total score (0-10 with 10 being normal): 10 CTA NECK Aortic arch: Great vessel origins are patent. There is mixed plaque along the innominate extending into the subclavian origin with less than 50% stenosis. Right carotid system: Patent. Atherosclerotic wall thickening with eccentric plaque along the proximal common carotid causing less than 50% stenosis. Minimal calcified plaque at the bifurcation. No stenosis at the ICA origin. Left carotid system: Patent. Mild calcified plaque at the ICA origin without stenosis. Vertebral arteries:Left vertebral artery is patent. Right V1 vertebral artery is not opacified. There is faint enhancement of the right V2 and V3 segments. Skeleton: Degenerative changes of the cervical spine. Other neck: Scarring at the right lung apex with traction bronchiectasis. CTA HEAD Anterior circulation: Intracranial internal carotid arteries are patent with minimal calcified plaque. Anterior and middle cerebral arteries are patent. Posterior circulation: Intracranial vertebral arteries are patent. Improved opacification of the right vertebral artery beyond the PICA origin probably related to retrograde flow. Basilar artery is patent. Posterior cerebral arteries are patent. Bilateral posterior communicating arteries are present, right larger than left. Venous sinuses: Not well evaluated. IMPRESSION: There is no acute intracranial hemorrhage or evidence of  acute infarction. ASPECT score is 10. 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  Patent intracranially with greater flow beyond PICA origin likely from retrograde flow. Otherwise, no hemodynamically significant stenosis. Initial results were communicated to Dr. Cheral Marker at 2:39 p.m. on 01/11/2021 by text page via the [facility] messaging system. Electronically Signed   By: Macy Mis M.D.   On: 01/11/2021 14:50   MR BRAIN W WO CONTRAST  Addendum Date: 01/11/2021   ADDENDUM REPORT: 01/11/2021 18:58 ADDENDUM: There is a wedge-shaped area of diffusion hyperintensity at the left paramedian pons without corresponding T2 hyperintensity suspicious for acute perforator infarct. Updated findings were discussed with Dr. Cheral Marker on 01/11/2021 at 6:50 p.m. Punctate focus of diffusion hyperintensity in the posterior left cerebellum is without correlate on coronal DWI. Electronically Signed   By: Macy Mis M.D.   On: 01/11/2021 18:58   Result Date: 01/11/2021 CLINICAL DATA:  Code stroke follow-up, history of multiple sclerosis EXAM: MRI HEAD WITHOUT AND WITH CONTRAST TECHNIQUE: Multiplanar, multiecho pulse sequences of the brain and surrounding structures were obtained without and with intravenous contrast. CONTRAST:  21mL GADAVIST GADOBUTROL 1 MMOL/ML IV SOLN COMPARISON:  12/14/2019 FINDINGS: Brain: There is no acute infarction or intracranial hemorrhage. There is no intracranial mass, mass effect, or edema. There is no hydrocephalus or extra-axial fluid collection. Foci of T2 hyperintensity are present in the periventricular greater than subcortical white matter consistent with history of multiple sclerosis. A few small foci are also present within the cerebellum. No change since the prior study. Prominence of the ventricles and sulci reflects stable parenchymal volume loss. No abnormal enhancement. Vascular: Major  vessel flow voids at the skull base are preserved. Skull and upper cervical spine: Normal marrow signal is preserved. Sinuses/Orbits: Minor mucosal thickening.  Orbits are unremarkable. Other: Sella is unremarkable.  Mastoid air cells are clear. IMPRESSION: No acute infarction. Stable burden of chronic demyelinating disease. No evidence of active demyelination. Stable parenchymal volume loss. Electronically Signed: By: Macy Mis M.D. On: 01/11/2021 17:16   CT HEAD CODE STROKE WO CONTRAST  Result Date: 01/11/2021 CLINICAL DATA:  Code stroke EXAM: CT HEAD WITHOUT CONTRAST CT ANGIOGRAPHY OF THE HEAD AND NECK TECHNIQUE: Contiguous axial images were obtained from the base of the skull through the vertex without intravenous contrast. Multidetector CT imaging of the head and neck was performed using the standard protocol during bolus administration of intravenous contrast. Multiplanar CT image reconstructions and MIPs were obtained to evaluate the vascular anatomy. Carotid stenosis measurements (when applicable) are obtained utilizing NASCET criteria, using the distal internal carotid diameter as the denominator. CONTRAST:  92mL OMNIPAQUE IOHEXOL 350 MG/ML SOLN COMPARISON:  None. FINDINGS: CT HEAD Brain: No acute intracranial hemorrhage, mass effect, or edema. Gray-white differentiation is preserved. Patchy hypoattenuation in the supratentorial white matter is nonspecific but probably reflects chronic microvascular ischemic changes. Prominence of the ventricles and sulci reflects generalized parenchymal volume loss. No extra-axial collection. Vascular: No hyperdense vessel. Skull: Unremarkable. Sinuses/Orbits: No acute abnormality. Other: Mastoid air cells are clear. ASPECTS ([place] Stroke Program Early CT Score) - Ganglionic level infarction (caudate, lentiform nuclei, internal capsule, insula, M1-M3 cortex): 7 - Supraganglionic infarction (M4-M6 cortex): 3 Total score (0-10 with 10 being normal): 10 CTA NECK  Aortic arch: Great vessel origins are patent. There is mixed plaque along the innominate extending into the subclavian origin with less than 50% stenosis. Right carotid system: Patent. Atherosclerotic wall thickening with eccentric plaque along the proximal common carotid causing less than 50% stenosis. Minimal calcified plaque at the bifurcation. No stenosis at the ICA origin. Left carotid system: Patent.  Mild calcified plaque at the ICA origin without stenosis. Vertebral arteries:Left vertebral artery is patent. Right V1 vertebral artery is not opacified. There is faint enhancement of the right V2 and V3 segments. Skeleton: Degenerative changes of the cervical spine. Other neck: Scarring at the right lung apex with traction bronchiectasis. CTA HEAD Anterior circulation: Intracranial internal carotid arteries are patent with minimal calcified plaque. Anterior and middle cerebral arteries are patent. Posterior circulation: Intracranial vertebral arteries are patent. Improved opacification of the right vertebral artery beyond the PICA origin probably related to retrograde flow. Basilar artery is patent. Posterior cerebral arteries are patent. Bilateral posterior communicating arteries are present, right larger than left. Venous sinuses: Not well evaluated. IMPRESSION: There is no acute intracranial hemorrhage or evidence of acute infarction. ASPECT score is 10. 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 Patent intracranially with greater flow beyond PICA origin likely from retrograde flow. Otherwise, no hemodynamically significant stenosis. Initial results were communicated to Dr. Cheral Marker at 2:39 p.m. on 01/11/2021 by text page via the [facility] messaging system. Electronically Signed   By: Macy Mis M.D.   On: 01/11/2021 14:50    EKG: Independently reviewed. Sinus with PVCs.  Assessment/Plan Active  Problems:   CVA (cerebral vascular accident) ([place])  (please populate well all problems here in Problem List. (For example, if patient is on BP meds at home and you resume or decide to hold them, it is a problem that needs to be her. Same for CAD, COPD, HLD and so on)  Acute pons CVA -Agreed with continue Coumadin.  Will consult pharmacy to fine tuning her coumadin dose.  -Allow permissive hypertension, hold lisinopril, continue metoprolol. -Echo -NPO for now.  History of mitral regurgitation status post mitral mitral replacement -INR subtherapeutic, re-dose Coumadin as above.  Hx of MS -Patient reported that symptoms not similar to previous flare-up many years ago. MRI seems not support a MS flare-up either.  HTN -Hold lisinopril, continue metoprolol.  Frequent PVCs -Continue metoprolol  Hypothyroidism -Check TSH, continue Synthroid.  COPD -Stable.  DVT prophylaxis: INR=2.2 Code Status: Full Code Family Communication: None at bedside Disposition Plan: Expect more than 2 midnight hospital stay, expect rehab discharge Consults called: Neuro Admission status: Tele admit   Lequita Halt MD [facility] Pager [phone]  01/11/2021, 7:20 PM

## 2021-01-11 NOTE — Code Documentation (Signed)
Patient presented to [facility] via [facility] with right sided weakness and an acute nonverbal state.  The patient had texted her neighbor at 1120 stating that she did not feel well.  At 1330, her neighbor came by to check on her and found he holding onto her counter and unable to speak.  EMS was called.  Code stroke called at  1402.  Her LKW was 1130.  NIHSS 20. No TPA given due to patient being on coumadin. Q2h neuro checks x 12 hours then q4h

## 2021-01-11 NOTE — ED Notes (Signed)
At this time, pt unable to complete swallow screen, will attempt at later time.

## 2021-01-11 NOTE — ED Provider Notes (Signed)
[facility] EMERGENCY DEPARTMENT Provider Note   CSN: [id number] Arrival date & time: 01/11/21  1417  An emergency department physician performed an initial assessment on this suspected stroke patient at 40.  History Chief Complaint  Patient presents with  . Code Stroke    [name] [name] is a 70 y.o. female.  Pt presents to the ED today as a code stroke.  The pt is unable to give any hx.  Pt called her neighbor around 43 and told the neighbor she did not feel well.  The neighbor came over to check on her around 1330 and found pt unresponsive with right arm and leg weakness.  The pt is on coumadin due to a hx of a mitral valve replacement.  A code stroke was called by EMS and pt was met at the bridge by myself and the stroke team.        Past Medical History:  Diagnosis Date  . Anxiety   . Arthritis    knees  . Breast cancer ([place]) 1999  . CHF (congestive heart failure) ([place])    Related to severe mitral regurgitation, April, 2013  . 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 Assess by pulmonary team in the hospital April, 2013  . Ejection fraction    EF 60%, echo, April, 2013, with severe MR before mitral valve replacement  . Herpes   . Hypothyroidism   . IBS (irritable bowel syndrome)   . Mitral valve regurgitation    Mitral valve replacement April, 2013, Mitral valve prolapse  . Multiple sclerosis ([place])   . Neurogenic bladder   . Osteoporosis   . Ovarian cyst   . Paroxysmal atrial fibrillation (HCC)    Rapid atrial fibrillation in-hospital, Rapid cardioversion,  before mitral valve surgery  . Pulmonary hypertension ([place])    Echo, April, 2013, before mitral valve surgery  . S/P Maze operation for atrial fibrillation 01/26/2012   Complete biatrial lesion set using cryothermy via right mini thoracotomy  . 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 Warfarin anticoagulation    Mechanical mitral prosthesis, April, 20136    Patient Active Problem List   Diagnosis Date Noted  . PVCs (premature ventricular contractions) 03/30/2020  . Osteoporosis 07/05/2019  . Nonischemic cardiomyopathy ([place] 07/20/2018  . Chest pain 07/20/2018  . Scoliosis 11/20/2016  . Long term current use of anticoagulant 09/18/2016  . Compression fracture of lumbar spine, non-traumatic, sequela 08/11/2016  . Multiple falls 08/11/2016  . At high risk for injury related to fall 08/11/2016  . Encounter for therapeutic drug monitoring 11/27/2013  . Hypothyroidism   . CHF (congestive heart failure) ([place]   . Paroxysmal atrial fibrillation (HCC)   . Arthritis   . Neurogenic bladder   . Warfarin anticoagulation   . First degree heart block 01/29/2012  . 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 S/P Maze operation for atrial fibrillation 01/26/2012  . Anxiety 01/18/2012  . Multiple sclerosis ([place] 01/19/2007    Past Surgical History:  Procedure Laterality Date  . BREAST LUMPECTOMY Right 1999   with sent.node, and axillary dissection (20)  . CHEST TUBE INSERTION  01/26/2012   Procedure: CHEST TUBE INSERTION;  Surgeon: CRexene Alberts MD;  [facility]  Service: Open Heart Surgery;  Laterality: Left;  . COLONOSCOPY  2010   "normal"  . CYSTOSCOPY  1992  . LAPAROSCOPIC OVARIAN CYSTECTOMY  1978   urethral stricture repair  . LEFT AND RIGHT HEART CATHETERIZATION WITH CORONARY ANGIOGRAM N/A 01/20/2012   Procedure: LEFT AND RIGHT HEART CATHETERIZATION WITH CORONARY ANGIOGRAM;  Surgeon: Burnell Blanks, MD;  Location: [facility] CATH LAB;  Service: Cardiovascular;  Laterality: N/A;  . LYMPHADENECTOMY    . MAZE  01/26/2012   Procedure: MAZE;  Surgeon: Rexene Alberts, MD;  Location: [facility];  Service: Open Heart Surgery;  Laterality: N/A;  . MITRAL VALVE REPLACEMENT  01/26/2012   Procedure: MINIMALLY INVASIVE MITRAL VALVE (MV) REPLACEMENT;  Surgeon:  Rexene Alberts, MD;  Location: [facility];  Service: Open Heart Surgery;  Laterality: Right;  . TEE WITHOUT CARDIOVERSION  01/19/2012   Procedure: TRANSESOPHAGEAL ECHOCARDIOGRAM (TEE);  Surgeon: Peter M Martinique, MD;  Location[facility] ENDOSCOPY;  Service: Cardiovascular;  Laterality: N/A;  . TONSILLECTOMY  1970  . [place]  . WRIST SURGERY Right 2012     OB History    Gravida  2   Para      Term      Preterm      AB      Living  2     SAB      IAB      Ectopic      Multiple      Live Births              Family History  Problem Relation Age of Onset  . Heart disease Father        cardiac arrest   . CAD Father   . Hypertension Father   . CAD Mother        5 stents and numerous bypass surgery  . Hypertension Mother   . Hyperlipidemia Mother   . CAD Other   . Breast cancer Maternal Grandmother   . Breast cancer Maternal Aunt     Social History   Tobacco Use  . Smoking status: Never Smoker  . Smokeless tobacco: Never Used  Vaping Use  . Vaping Use: Never used  Substance Use Topics  . Alcohol use: No  . Drug use: No    Home Medications Prior to Admission medications   Medication Sig Start Date End Date Taking? Authorizing Provider  amoxicillin (AMOXIL) 500 MG capsule take 4 capsules by mouth 1 hour prior to dental appointment 12/06/17   [provider]  ARMOUR THYROID 90 MG tablet Take 90 mg by mouth daily.    [provider]  Biotin 5000 MCG CAPS Take 20,000 mcg by mouth.     [provider]  Black Cohosh 40 MG CAPS Take 40 mg by mouth daily.    [provider]  Bromelains (BROMELAIN PO) Take 1 capsule by mouth 2 (two) times daily.    [provider]  calcium carbonate (OSCAL) 1500 (600 Ca) MG TABS tablet Takes 1 time daily    [provider]  Cholecalciferol (VITAMIN D3) 2000 units capsule Take 6,000 Units by mouth daily.     [provider]  Coenzyme Q10 (CO Q-10) 100 MG CAPS  Take 1 capsule by mouth daily.     [provider]  Cranberry 500 MG CAPS Take 1 capsule by mouth daily.    [provider]  L-THEANINE PO Take 1 capsule by mouth daily.     [provider]  lactose free nutrition (BOOST PLUS) LIQD Take 237 mLs by mouth daily.     [provider]  lidocaine-prilocaine (EMLA)  cream Apply 1 application topically as needed (per pt this is for use before blood draws).  03/06/15   [provider]  lisinopril (ZESTRIL) 5 MG tablet Take 1 tablet (5 mg total) by mouth daily. 07/25/20   Dorothy Spark, MD  Lysine 500 MG CAPS Take 1 capsule by mouth daily.    [provider]  Magnesium Citrate 200 MG TABS Take 200 mg by mouth daily as needed.    [provider]  metoprolol tartrate (LOPRESSOR) 25 MG tablet TAKE 1 TAB (25 MG) PO TWICE DAILY - pt must make appt with provider for further refills - 1st attempt Patient taking differently: Take 25 mg by mouth 2 (two) times daily. 03/29/20   Dorothy Spark, MD  MILK THISTLE PO Take 1 capsule by mouth 2 (two) times daily.    [provider]  Misc Natural Products (GLUCOSAMINE CHOND COMPLEX/MSM PO) Strength of dose Glucosamine 1574m /Chodroitron 10033m MSM 50042makes one twice daily    [provider]  NON FORMULARY 1 capsule 2 (two) times daily.    [provider]  Olive Leaf 500 MG CAPS Take 1 capsule by mouth 2 (two) times daily.     [provider]  Petasin (PETADOLEX PO) Take by mouth 2 (two) times daily.    [provider]  Probiotic Product (PROBIOTIC DAILY PO) Take 1 capsule once a day    [provider]  PROGESTERONE MICRONIZED PO Take 100 mg by mouth daily.     [provider]  Pumpkin Seed 500 MG TABS Take 1 tablet by mouth 2 (two) times daily.    [provider]  TURMERIC PO Take by mouth.    [provider]  vitamin A 10000 UNIT capsule Take 10,000 Units by mouth daily.     [provider]  vitamin B-12 (CYANOCOBALAMIN) 1000 MCG tablet Take 1,000 mcg by mouth daily. Taking sublingal    [provider]  vitamin E 400 UNIT capsule Take 400 Units by mouth daily.    [provider]  warfarin (COUMADIN) 5 MG tablet TAKE 1 TABLET DAILY EXCEPT 1.5 TABLETS ON MONDAY AND THURSDAY  BY MOUTH EVERY DAY OR AS DIRECTED BY COUMADIN CLINIC 11/14/20   NelDorothy SparkD    Allergies    Erythromycin and Valsartan  Review of Systems   Review of Systems  Unable to perform ROS: Mental status change    Physical Exam Updated Vital Signs BP (!) 140/57   Pulse 74   Temp 97.6 F (36.4 C) (Oral)   Resp (!) 22   Ht _0  (1.702 m)   Wt 69.4 kg   SpO2 96%   BMI 23.96 kg/m   Physical Exam Vitals and nursing note reviewed.  HENT:     Right Ear: External ear normal.     Left Ear: External ear normal.     Nose: Nose normal.     Mouth/Throat:     Mouth: Mucous membranes are moist.  Eyes:     Comments: Right eye deviated to the right.  Pupils reactive.  Cardiovascular:     Rate and Rhythm: Normal rate and regular rhythm.     Pulses: Normal pulses.     Heart sounds: Normal heart sounds.  Pulmonary:     Effort: Pulmonary effort is normal.     Breath sounds: Normal breath sounds.  Abdominal:     General: Abdomen is flat. Bowel sounds are normal.  Palpations: Abdomen is soft.  Musculoskeletal:        General: Normal range of motion.     Cervical back: Normal range of motion and neck supple.  Skin:    General: Skin is warm.     Capillary Refill: Capillary refill takes less than 2 seconds.  Neurological:     Mental Status: She is alert.     Comments: Pt is aphasic.  She is not moving her right arm or right leg.       ED Results / Procedures / Treatments   Labs (all labs ordered are listed, but only abnormal results are displayed) Labs Reviewed  PROTIME-INR - Abnormal; Notable for the following components:      Result Value    Prothrombin Time 23.8 (*)    INR 2.2 (*)    All other components within normal limits  COMPREHENSIVE METABOLIC PANEL - Abnormal; Notable for the following components:   Glucose, Bld 146 (*)    BUN 27 (*)    Albumin 3.3 (*)    All other components within normal limits  I-STAT CHEM 8, ED - Abnormal; Notable for the following components:   BUN 27 (*)    Glucose, Bld 143 (*)    Calcium, Ion 1.04 (*)    All other components within normal limits  RESP PANEL BY RT-PCR (FLU A&B, COVID) ARPGX2  APTT  CBC  DIFFERENTIAL  URINALYSIS, ROUTINE W REFLEX MICROSCOPIC  CBG MONITORING, ED    EKG None  Radiology CT ANGIO HEAD W OR WO CONTRAST  Result Date: 01/11/2021 CLINICAL DATA:  Code stroke EXAM: CT HEAD WITHOUT CONTRAST CT ANGIOGRAPHY OF THE HEAD AND NECK TECHNIQUE: Contiguous axial images were obtained from the base of the skull through the vertex without intravenous contrast. Multidetector CT imaging of the head and neck was performed using the standard protocol during bolus administration of intravenous contrast. Multiplanar CT image reconstructions and MIPs were obtained to evaluate the vascular anatomy. Carotid stenosis measurements (when applicable) are obtained utilizing NASCET criteria, using the distal internal carotid diameter as the denominator. CONTRAST:  63m OMNIPAQUE IOHEXOL 350 MG/ML SOLN COMPARISON:  None. FINDINGS: CT HEAD Brain: No acute intracranial hemorrhage, mass effect, or edema. Gray-white differentiation is preserved. Patchy hypoattenuation in the supratentorial white matter is nonspecific but probably reflects chronic microvascular ischemic changes. Prominence of the ventricles and sulci reflects generalized parenchymal volume loss. No extra-axial collection. Vascular: No hyperdense vessel. Skull: Unremarkable. Sinuses/Orbits: No acute abnormality. Other: Mastoid air cells are clear. ASPECTS ([place] Early CT Score) - Ganglionic level infarction (caudate,  lentiform nuclei, internal capsule, insula, M1-M3 cortex): 7 - Supraganglionic infarction (M4-M6 cortex): 3 Total score (0-10 with 10 being normal): 10 CTA NECK Aortic arch: Great vessel origins are patent. There is mixed plaque along the innominate extending into the subclavian origin with less than 50% stenosis. Right carotid system: Patent. Atherosclerotic wall thickening with eccentric plaque along the proximal common carotid causing less than 50% stenosis. Minimal calcified plaque at the bifurcation. No stenosis at the ICA origin. Left carotid system: Patent. Mild calcified plaque at the ICA origin without stenosis. Vertebral arteries:Left vertebral artery is patent. Right V1 vertebral artery is not opacified. There is faint enhancement of the right V2 and V3 segments. Skeleton: Degenerative changes of the cervical spine. Other neck: Scarring at the right lung apex with traction bronchiectasis. CTA HEAD Anterior circulation: Intracranial internal carotid arteries are patent with minimal calcified plaque. Anterior and middle cerebral arteries are patent. Posterior  circulation: Intracranial vertebral arteries are patent. Improved opacification of the right vertebral artery beyond the PICA origin probably related to retrograde flow. Basilar artery is patent. Posterior cerebral arteries are patent. Bilateral posterior communicating arteries are present, right larger than left. Venous sinuses: Not well evaluated. IMPRESSION: There is no acute intracranial hemorrhage or evidence of acute infarction. ASPECT score is 10. 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 Patent intracranially with greater flow beyond PICA origin likely from retrograde flow. Otherwise, no hemodynamically significant stenosis. Initial results were communicated to Dr. Cheral Marker at 2:39 p.m. on 01/11/2021 by text page via the [facility] messaging  system. Electronically Signed   By: Macy Mis M.D.   On: 01/11/2021 14:50   CT ANGIO NECK W OR WO CONTRAST  Result Date: 01/11/2021 CLINICAL DATA:  Code stroke EXAM: CT HEAD WITHOUT CONTRAST CT ANGIOGRAPHY OF THE HEAD AND NECK TECHNIQUE: Contiguous axial images were obtained from the base of the skull through the vertex without intravenous contrast. Multidetector CT imaging of the head and neck was performed using the standard protocol during bolus administration of intravenous contrast. Multiplanar CT image reconstructions and MIPs were obtained to evaluate the vascular anatomy. Carotid stenosis measurements (when applicable) are obtained utilizing NASCET criteria, using the distal internal carotid diameter as the denominator. CONTRAST:  42m OMNIPAQUE IOHEXOL 350 MG/ML SOLN COMPARISON:  None. FINDINGS: CT HEAD Brain: No acute intracranial hemorrhage, mass effect, or edema. Gray-white differentiation is preserved. Patchy hypoattenuation in the supratentorial white matter is nonspecific but probably reflects chronic microvascular ischemic changes. Prominence of the ventricles and sulci reflects generalized parenchymal volume loss. No extra-axial collection. Vascular: No hyperdense vessel. Skull: Unremarkable. Sinuses/Orbits: No acute abnormality. Other: Mastoid air cells are clear. ASPECTS ([place] Early CT Score) - Ganglionic level infarction (caudate, lentiform nuclei, internal capsule, insula, M1-M3 cortex): 7 - Supraganglionic infarction (M4-M6 cortex): 3 Total score (0-10 with 10 being normal): 10 CTA NECK Aortic arch: Great vessel origins are patent. There is mixed plaque along the innominate extending into the subclavian origin with less than 50% stenosis. Right carotid system: Patent. Atherosclerotic wall thickening with eccentric plaque along the proximal common carotid causing less than 50% stenosis. Minimal calcified plaque at the bifurcation. No stenosis at the ICA origin. Left  carotid system: Patent. Mild calcified plaque at the ICA origin without stenosis. Vertebral arteries:Left vertebral artery is patent. Right V1 vertebral artery is not opacified. There is faint enhancement of the right V2 and V3 segments. Skeleton: Degenerative changes of the cervical spine. Other neck: Scarring at the right lung apex with traction bronchiectasis. CTA HEAD Anterior circulation: Intracranial internal carotid arteries are patent with minimal calcified plaque. Anterior and middle cerebral arteries are patent. Posterior circulation: Intracranial vertebral arteries are patent. Improved opacification of the right vertebral artery beyond the PICA origin probably related to retrograde flow. Basilar artery is patent. Posterior cerebral arteries are patent. Bilateral posterior communicating arteries are present, right larger than left. Venous sinuses: Not well evaluated. IMPRESSION: There is no acute intracranial hemorrhage or evidence of acute infarction. ASPECT score is 10. 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 Patent intracranially with greater flow beyond PICA origin likely from retrograde flow. Otherwise, no hemodynamically significant stenosis. Initial results were communicated to Dr. LCheral Markerat 2:39 p.m. on 01/11/2021 by text page via the [facility]aging system. Electronically Signed   By: PMacy Mis  M.D.   On: 01/11/2021 14:50   CT HEAD CODE STROKE WO CONTRAST  Result Date: 01/11/2021 CLINICAL DATA:  Code stroke EXAM: CT HEAD WITHOUT CONTRAST CT ANGIOGRAPHY OF THE HEAD AND NECK TECHNIQUE: Contiguous axial images were obtained from the base of the skull through the vertex without intravenous contrast. Multidetector CT imaging of the head and neck was performed using the standard protocol during bolus administration of intravenous contrast. Multiplanar CT image reconstructions and MIPs  were obtained to evaluate the vascular anatomy. Carotid stenosis measurements (when applicable) are obtained utilizing NASCET criteria, using the distal internal carotid diameter as the denominator. CONTRAST:  51m OMNIPAQUE IOHEXOL 350 MG/ML SOLN COMPARISON:  None. FINDINGS: CT HEAD Brain: No acute intracranial hemorrhage, mass effect, or edema. Gray-white differentiation is preserved. Patchy hypoattenuation in the supratentorial white matter is nonspecific but probably reflects chronic microvascular ischemic changes. Prominence of the ventricles and sulci reflects generalized parenchymal volume loss. No extra-axial collection. Vascular: No hyperdense vessel. Skull: Unremarkable. Sinuses/Orbits: No acute abnormality. Other: Mastoid air cells are clear. ASPECTS ([place] Early CT Score) - Ganglionic level infarction (caudate, lentiform nuclei, internal capsule, insula, M1-M3 cortex): 7 - Supraganglionic infarction (M4-M6 cortex): 3 Total score (0-10 with 10 being normal): 10 CTA NECK Aortic arch: Great vessel origins are patent. There is mixed plaque along the innominate extending into the subclavian origin with less than 50% stenosis. Right carotid system: Patent. Atherosclerotic wall thickening with eccentric plaque along the proximal common carotid causing less than 50% stenosis. Minimal calcified plaque at the bifurcation. No stenosis at the ICA origin. Left carotid system: Patent. Mild calcified plaque at the ICA origin without stenosis. Vertebral arteries:Left vertebral artery is patent. Right V1 vertebral artery is not opacified. There is faint enhancement of the right V2 and V3 segments. Skeleton: Degenerative changes of the cervical spine. Other neck: Scarring at the right lung apex with traction bronchiectasis. CTA HEAD Anterior circulation: Intracranial internal carotid arteries are patent with minimal calcified plaque. Anterior and middle cerebral arteries are patent. Posterior circulation:  Intracranial vertebral arteries are patent. Improved opacification of the right vertebral artery beyond the PICA origin probably related to retrograde flow. Basilar artery is patent. Posterior cerebral arteries are patent. Bilateral posterior communicating arteries are present, right larger than left. Venous sinuses: Not well evaluated. IMPRESSION: There is no acute intracranial hemorrhage or evidence of acute infarction. ASPECT score is 10. 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 Patent intracranially with greater flow beyond PICA origin likely from retrograde flow. Otherwise, no hemodynamically significant stenosis. Initial results were communicated to Dr. LCheral Markerat 2:39 p.m. on 01/11/2021 by text page via the [facility] Systemmessaging system. Electronically Signed   By: PMacy MisM.D.   On: 01/11/2021 14:50    Procedures Procedures   Medications Ordered in ED Medications  sodium chloride flush (NS) 0.9 % injection 3 mL (3 mLs Intravenous Given 01/11/21 1440)  iohexol (OMNIPAQUE) 350 MG/ML injection 80 mL (80 mLs Intravenous Contrast Given 01/11/21 1435)    ED Course  I have reviewed the triage vital signs and the nursing notes.  Pertinent labs & imaging results that were available during my care of the patient were reviewed by me and considered in my medical decision making (see chart for details).    MDM Rules/Calculators/A&P  Pt taken directly to CT.  CT neg.  CTA neg.  Neurology recommends admission to medicine for stroke work up.  MRI ordered.  Pt d/w Dr. Lorin Mercy ([facility]) who said to have the call paged back out after pt gets back from MRI.  CRITICAL CARE Performed by: Isla Pence   Total critical care time: 30 minutes  Critical care time was exclusive of separately billable procedures and treating other patients.  Critical care was necessary to treat  or prevent imminent or life-threatening deterioration.  Critical care was time spent personally by me on the following activities: development of treatment plan with patient and/or surrogate as well as nursing, discussions with consultants, evaluation of patient's response to treatment, examination of patient, obtaining history from patient or surrogate, ordering and performing treatments and interventions, ordering and review of laboratory studies, ordering and review of radiographic studies, pulse oximetry and re-evaluation of patient's condition. .  Final Clinical Impression(s) / ED Diagnoses Final diagnoses:  Right sided weakness    Rx / DC Orders ED Discharge Orders    None       Isla Pence, MD 01/11/21 1541

## 2021-01-11 NOTE — Consult Note (Signed)
NEURO HOSPITALIST CONSULT NOTE   Requestig physician: Dr. Gilford Raid  Reason for Consult: Acute onset of right sided weakness and aphasia  History obtained from:  EMS and Chart    HPI:                                                                                                                                          [name] [name] is an 70 y.o. female with a medical history significant for COPD, congestive heart failure, mitral valve replacement on coumadin (INR goal 2.5 - 3.5), hypothyroidism, and diagnosed with multiple sclerosis in 1994 after experiencing tingling in her hands and frequent falls. She has never been on disease modifying therapy due to concerns for potential side effects but has not had any reported or documented MS flares since diagnosis. She does have chronic multiple sclerosis symptoms including falls, MS-related chronic gait instability requiring a walker for ambulation, neurogenic bladder, and occasional left hand tingling. She presented to the ED today as a Code Stroke for evaluation of right-sided weakness and acute nonverbal state.   [name] [name] texted her neighbor at 11:20 this morning telling them that she did not feel well. The neighbor called her at 11:30 to communicate with [name] [name] who again explained that she did not feel well. At 130 PM, her neighbor stopped by to check on her and noticed that she was holding onto the counter while standing up, and was non-verbal. EMS was called and the neighbor assisted her to the ground prior to them arriving. When EMS arrived the patient was lying on the ground with right arm and leg weakness, nonverbal, right eye deviated with left eye stright ahead. No witnessed seizure activity. No urinary incontinence or tongue bite noted.   LKN 11:30 TPA: No, Patient on home Coumadin  NIHSS:  1a Level of Conscious.: 0 1b LOC Questions: 2 1c LOC Commands: 0 2 Best Gaze: 2; right gaze forced right unable to  overcome, left eye midline fixed gaze 3 Visual: 1; right eye equivocal blink to threat in left visual field 4 Facial Palsy: 1; right mouth droop asymmetry 5a Motor Arm - left: 0  5b Motor Arm - Right: 4; right arm flaccid 6a Motor Leg - Left: 2; spontaneous antigravity movement intermittently but drifts to bed  6b Motor Leg - Right: 3; minimal movement to stimulation but no effort against gravity 7 Limb Ataxia: 0  8 Sensory: 1 9 Best Language: 2 10 Dysarthria: 2 11 Extinct. and Inatten.: 0 TOTAL: 20  MRS: 1 (walks with a walker)  Past Medical History:  Diagnosis Date  . Anxiety   . Arthritis    knees  . Breast cancer ([place]) 1999  . CHF (congestive heart failure) (HCC)    Related to severe mitral  regurgitation, April, 2013  . 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 Assess by pulmonary team in the hospital April, 2013  . Ejection fraction    EF 60%, echo, April, 2013, with severe MR before mitral valve replacement  . Herpes   . Hypothyroidism   . IBS (irritable bowel syndrome)   . Mitral valve regurgitation    Mitral valve replacement April, 2013, Mitral valve prolapse  . Multiple sclerosis ([place])   . Neurogenic bladder   . Osteoporosis   . Ovarian cyst   . Paroxysmal atrial fibrillation (HCC)    Rapid atrial fibrillation in-hospital, Rapid cardioversion,  before mitral valve surgery  . Pulmonary hypertension ([place])    Echo, April, 2013, before mitral valve surgery  . S/P Maze operation for atrial fibrillation 01/26/2012   Complete biatrial lesion set using cryothermy via right mini thoracotomy  . S/P mitral valve replacement 01/26/2012   27mm Sorin [employer] Optiform mechanical prosthesis via right mini thoracotomy  . Warfarin anticoagulation    Mechanical mitral prosthesis, April, 20136   Past Surgical History:  Procedure Laterality Date  . BREAST LUMPECTOMY Right 1999   with sent.node, and axillary dissection (20)  . CHEST TUBE  INSERTION  01/26/2012   Procedure: CHEST TUBE INSERTION;  Surgeon: Rexene Alberts, MD;  Location: [place] [facility];  Service: Open Heart Surgery;  Laterality: Left;  . COLONOSCOPY  2010   "normal"  . CYSTOSCOPY  1992  . LAPAROSCOPIC OVARIAN CYSTECTOMY  1978   urethral stricture repair  . LEFT AND RIGHT HEART CATHETERIZATION WITH CORONARY ANGIOGRAM N/A 01/20/2012   Procedure: LEFT AND RIGHT HEART CATHETERIZATION WITH CORONARY ANGIOGRAM;  Surgeon: Burnell Blanks, MD;  [facility] C[facility];  Service: Cardiovascular;  Laterality: N/A;  . LYMPHADENECTOMY    . MAZE  01/26/2012   Procedure: MAZE;  Surgeon: Rexene Alberts, MD;  Location: [place];  Service: Open Heart Surgery;  Laterality: N/A;  . MITRAL VALVE REPLACEMENT  01/26/2012   Procedure: MINIMALLY INVASIVE MITRAL VALVE (MV) REPLACEMENT;  Surgeon: Rexene Alberts, MD;  Location: [place];  Service: Open Heart Surgery;  Laterality: Right;  . TEE WITHOUT CARDIOVERSION  01/19/2012   Procedure: TRANSESOPHAGEAL ECHOCARDIOGRAM (TEE);  Surgeon: Peter M Martinique, MD;  Location[facility] ENDOSCOPY;  Service: Cardiovascular;  Laterality: N/A;  . TONSILLECTOMY  1970  . [place]  . WRIST SURGERY Right 2012   Family History  Problem Relation Age of Onset  . Heart disease Father        cardiac arrest   . CAD Father   . Hypertension Father   . CAD Mother        5 stents and numerous bypass surgery  . Hypertension Mother   . Hyperlipidemia Mother   . CAD Other   . Breast cancer Maternal Grandmother   . Breast cancer Maternal Aunt              Social History:  reports that she has never smoked. She has never used smokeless tobacco. She reports that she does not drink alcohol and does not use drugs.  Allergies  Allergen Reactions  . Erythromycin Nausea And Vomiting  . Valsartan Other (See Comments)    Pt reports caused her depression   MEDICATIONS:  Current Outpatient Medications  Medication Instructions  . amoxicillin (AMOXIL) 500 MG capsule take 4 capsules by mouth 1 hour prior to dental appointment  . Biotin 20,000 mcg, Oral  . Black Cohosh 40 mg, Oral, Daily  . Bromelains (BROMELAIN PO) 1 capsule, 2 times daily  . calcium carbonate (OSCAL) 1500 (600 Ca) MG TABS tablet Takes 1 time daily   . Coenzyme Q10 (CO Q-10) 100 MG CAPS 1 capsule, Daily  . Cranberry 500 MG CAPS 1 capsule, Daily  . L-THEANINE PO 1 capsule, Daily  . lactose free nutrition (BOOST PLUS) LIQD 237 mLs, Every 24 hours  . lidocaine-prilocaine (EMLA) cream 1 application, Topical, As needed  . lisinopril (ZESTRIL) 5 mg, Oral, Daily  . Lysine 500 MG CAPS 1 capsule, Daily  . Magnesium Citrate 200 mg, Oral, Daily PRN  . metoprolol tartrate (LOPRESSOR) 25 MG tablet TAKE 1 TAB (25 MG) PO TWICE DAILY - pt must make appt with provider for further refills - 1st attempt  . MILK THISTLE PO 1 capsule, 2 times daily  . Misc Natural Products (GLUCOSAMINE CHOND COMPLEX/MSM PO) Strength of dose Glucosamine 1500mg  /Chodroitron 1000mg / MSM 500mg  takes one twice daily   . NON FORMULARY 1 capsule, 2 times daily  . Olive Leaf 500 MG CAPS 1 capsule, 2 times daily  . Petasin (PETADOLEX PO) Oral, 2 times daily  . Probiotic Product (PROBIOTIC DAILY PO) Take 1 capsule once a day   . PROGESTERONE MICRONIZED PO 100 mg, Oral, Daily  . Pumpkin Seed 500 MG TABS 1 tablet, Oral, 2 times daily  . thyroid (ARMOUR) 90 mg, Oral, Daily  . TURMERIC PO Oral  . vitamin A 10,000 Units, Daily  . vitamin B-12 (CYANOCOBALAMIN) 1,000 mcg, Oral, Daily, Taking sublingal   . Vitamin D3 6,000 Units, Oral, Daily  . vitamin E 400 Units, Daily  . warfarin (COUMADIN) 5 MG tablet TAKE 1 TABLET DAILY EXCEPT 1.5 TABLETS ON MONDAY AND THURSDAY  BY MOUTH EVERY DAY OR AS DIRECTED BY COUMADIN CLINIC    ROS:                                                                                                                                        Unable to obtain due to AMS  Blood pressure 139/67, pulse 72, temperature 97.6 F (36.4 C), temperature source Oral, resp. rate (!) 30, height 5\' 7"  (1.702 m), weight 69.4 kg, SpO2 97 %.  General Examination:  Physical Exam  HEENT-  [place]/AT    Lungs- Respirations unlabored Extremities- No edema  Neurological Examination Mental Status: Obtunded. Follows simple commands such as nodding weakly yes, protruding tongue weakly, touching her nose to command with repeated coaching. Eyes closed. Is able to whisper her full name when asked, states she is at [facility] for stroke. Attempts to answer year but verbalization is unintelligible.  Patient is unable to give a clear and coherent history. Speech is dysarthric, aphasic / mostly unintelligible. She is able to name "thumb" with unclear speech, attempts to answer watch and pen with mouth movements forming the first letter but with other than above, her minimal speech output varies from unintelligible to mute.  Cranial Nerves: II: Left pupil 4 mm >> 2 mm and briskly reactive. Right pupil 3 mm >> 2 mm and sluggishly reactive.  III,IV, VI: At rest left eye is at midline, right eye is deviated to the right. Does not gaze to left or right on command and also does not track. No nystagmus. Doll's eye reflex is suppressed. Left eye with fully intact visual fields, right eye unequivocal to the left visual field, intact to the right visual field. V,VII: Flinches briskly to cold temp left side of face; does not flinch with cold to right side of face. Mouth is held half open. Smile is asymmetric with right mouth droop noted. VIII: Hearing intact to voice IX,X: Unable to visualize palate, no vocalization (speaks in no louder than whisper / mouthing words) XI: Head is midline XII: Protrudes tongue weakly about 2 mm without deviation.   Motor: Right : Upper extremity   1/5    Left:     Upper extremity   5/5  Lower extremity   1/5     Lower extremity   3/5 Bulk normal throughout, tone spastic on the left and flaccid on the right Sensory: Intact to light touch in the upper extremities but does provide information about symmetry. Nods "yes" to sensation to light touch in bilateral lower extremities but nods "no" when asked if they are symmetric. Does not provide further information. Deep Tendon Reflexes: 2+ and symmetric biceps, 3+ and symmetric patellae Plantars: Right: upgoing   Left: upgoing Cerebellar: Unable to assess due to patient unable to follow complex commands Gait: Unable to assess   Lab Results: Basic Metabolic Panel: Recent Labs  Lab 01/11/21 1427  NA 138  K 3.5  CL 102  GLUCOSE 143*  BUN 27*  CREATININE 0.70   CBC: Recent Labs  Lab 01/11/21 1427 01/11/21 1440  WBC  --  6.5  NEUTROABS  --  3.5  HGB 13.9 14.3  HCT 41.0 42.9  MCV  --  95.3  PLT  --  263   Cardiac Enzymes: No results for input(s): CKTOTAL, CKMB, CKMBINDEX, TROPONINI in the last 168 hours.  Lipid Panel: No results for input(s): CHOL, TRIG, HDL, CHOLHDL, VLDL, LDLCALC in the last 168 hours.  Imaging:  CT Head Code Stroke, CT angio head and neck: IMPRESSION: There is no acute intracranial hemorrhage or evidence of acute infarction. ASPECT score is 10. 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 Patent intracranially with greater flow beyond PICA origin likely from retrograde flow. Otherwise, no hemodynamically significant stenosis.  Assessment: 70 year old female with history as above who presents with acute onset right-sided weakness and speech disturbance - Patient on Coumadin at home with goal INR of 2.5 - 3.5 due to  history of mitral valve replacement. Not appropriate for tPA.  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 MRI brain recommended for further evaluation of suspected acute ischemia/infarct. - DDx includes cerebral hypoperfusion versus seizure with post-ictal state. Cerebral hypoperfusion likely with evidence of high-grade stenosis of the proximal right vertebral artery with diminished enhancement in the neck. Seizure less likely with no witnessed seizure activity, patient standing when found and assisted to the ground by neighbor and with persistent weakness without altered mentation.   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 Not a candidate for thrombectomy.  - Stroke work up as above.   Electronically signed: Dr. Kerney Elbe

## 2021-01-11 NOTE — ED Triage Notes (Signed)
Pt BIB [facility] from home. Per EMS patient QUC [id number], neighbor called @ 1330. Right sided weakness, right eye deviation, no verbal. Pt is on Warfarin. VSS.

## 2021-01-11 NOTE — Progress Notes (Signed)
ANTICOAGULATION CONSULT NOTE - Initial Consult  Pharmacy Consult for Warfarin Indication: MVR  Allergies  Allergen Reactions  . Erythromycin Nausea And Vomiting  . Valsartan Other (See Comments)    Pt reports caused her depression    Patient Measurements: Height: 5\' 7"  (170.2 cm) Weight: 69.4 kg (153 lb) IBW/kg (Calculated) : 61.6 Heparin Dosing Weight:   Vital Signs: Temp: 98.6 F (37 C) (03/26 1927) Temp Source: Oral (03/26 1927) BP: 126/98 (03/26 1927) Pulse Rate: 97 (03/26 1927)  Labs: Recent Labs    01/11/21 1427 01/11/21 1440  HGB 13.9 14.3  HCT 41.0 42.9  PLT  --  263  APTT  --  26  LABPROT  --  23.8*  INR  --  2.2*  CREATININE 0.70 0.78    Estimated Creatinine Clearance: 62.7 mL/min (by C-G formula based on SCr of 0.78 mg/dL).   Medical History: Past Medical History:  Diagnosis Date  . Anxiety   . Arthritis    knees  . Breast cancer ([place]) 1999  . CHF (congestive heart failure) ([place])    Related to severe mitral regurgitation, April, 2013  . 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 Assess by pulmonary team in the hospital April, 2013  . Ejection fraction    EF 60%, echo, April, 2013, with severe MR before mitral valve replacement  . Herpes   . Hypothyroidism   . IBS (irritable bowel syndrome)   . Mitral valve regurgitation    Mitral valve replacement April, 2013, Mitral valve prolapse  . Multiple sclerosis ([place])   . Neurogenic bladder   . Osteoporosis   . Ovarian cyst   . Paroxysmal atrial fibrillation (HCC)    Rapid atrial fibrillation in-hospital, Rapid cardioversion,  before mitral valve surgery  . Pulmonary hypertension ([place])    Echo, April, 2013, before mitral valve surgery  . S/P Maze operation for atrial fibrillation 01/26/2012   Complete biatrial lesion set using cryothermy via right mini thoracotomy  . S/P mitral valve replacement 01/26/2012   59mm Sorin [employer] Optiform mechanical prosthesis via  right mini thoracotomy  . Warfarin anticoagulation    Mechanical mitral prosthesis, April, 20136    Medications:  Scheduled:  .  stroke: mapping our early stages of recovery book   Does not apply Once  . [START ON 01/12/2021] calcium carbonate  1 tablet Oral Q breakfast  . [START ON 01/12/2021] cholecalciferol  6,000 Units Oral Daily  . meclizine  12.5 mg Oral TID  . metoprolol tartrate  25 mg Oral BID  . [START ON 01/12/2021] thyroid  90 mg Oral Daily  . warfarin  7.5 mg Oral Once  . [START ON 01/12/2021] Warfarin - Pharmacist Dosing Inpatient   Does not apply q1600    Assessment: Patient is a 65 yof that presented to the ED with concerns for a stroke the patient is on warfarin for a MVR with an INR goal of 2.5-3.5. Patient's INR is currently 2.2 today.  PTA Regimen: 7.5mg  M /Thur and 5mg  ROW  Goal of Therapy:  INR 2.5-3.5 Monitor platelets by anticoagulation protocol: Yes   Plan:  - INR currently subtherapeutic at 2.2 - Plan for Warfarin 7.5mg  PO x 1 dose tonight  - Monitor patient for s/s of bleeding and cbc while inpatient  Duanne Limerick PharmD. BCPS  01/11/2021,7:32 PM

## 2021-01-11 NOTE — ED Provider Notes (Signed)
  Physical Exam  BP (!) 133/59   Pulse 89   Temp 97.6 F (36.4 C) (Oral)   Resp 12   Ht 5\' 7"  (1.702 m)   Wt 69.4 kg   SpO2 91%   BMI 23.96 kg/m   Physical Exam  ED Course/Procedures     Procedures  MDM  Received care from Dr. Gilford Raid. MR discussed with Dr. Cheral Marker, concerning for acute infarct> Admitted for further care.        Gareth Morgan, MD 01/12/21 1150

## 2021-01-12 ENCOUNTER — Inpatient Hospital Stay (HOSPITAL_COMMUNITY): Payer: Medicare Other

## 2021-01-12 ENCOUNTER — Encounter (HOSPITAL_COMMUNITY): Payer: Self-pay | Admitting: Internal Medicine

## 2021-01-12 DIAGNOSIS — Z7901 Long term (current) use of anticoagulants: Secondary | ICD-10-CM

## 2021-01-12 DIAGNOSIS — I480 Paroxysmal atrial fibrillation: Secondary | ICD-10-CM

## 2021-01-12 LAB — LIPID PANEL
Cholesterol: 204 mg/dL — ABNORMAL HIGH (ref 0–200)
HDL: 62 mg/dL (ref >40)
LDL Cholesterol: 131 mg/dL — ABNORMAL HIGH (ref 0–99)
Total CHOL/HDL Ratio: 3.3 RATIO
Triglycerides: 53 mg/dL (ref <150)
VLDL: 11 mg/dL (ref 0–40)

## 2021-01-12 LAB — PROTIME-INR
INR: 2.3 — ABNORMAL HIGH (ref 0.8–1.2)
Prothrombin Time: 24.9 seconds — ABNORMAL HIGH (ref 11.4–15.2)

## 2021-01-12 LAB — HEMOGLOBIN A1C
Hgb A1c MFr Bld: 5.5 % (ref 4.8–5.6)
Mean Plasma Glucose: 111.15 mg/dL

## 2021-01-12 MED ORDER — WARFARIN SODIUM 7.5 MG PO TABS
7.5000 mg | ORAL_TABLET | Freq: Once | ORAL | Status: AC
  Administered 2021-01-12: 7.5 mg via ORAL
  Filled 2021-01-12: qty 1

## 2021-01-12 MED ORDER — ATORVASTATIN CALCIUM 80 MG PO TABS
80.0000 mg | ORAL_TABLET | Freq: Every day | ORAL | Status: DC
  Administered 2021-01-12 – 2021-01-13 (×2): 80 mg via ORAL
  Filled 2021-01-12 (×2): qty 1

## 2021-01-12 MED ORDER — ASPIRIN EC 81 MG PO TBEC
81.0000 mg | DELAYED_RELEASE_TABLET | Freq: Every day | ORAL | Status: DC
  Administered 2021-01-12 – 2021-01-13 (×2): 81 mg via ORAL
  Filled 2021-01-12 (×2): qty 1

## 2021-01-12 NOTE — Evaluation (Signed)
Occupational Therapy Evaluation Patient Details Name: [name] [name] MRN: [id number] DOB: [birth year] Today's Date: 01/12/2021    History of Present Illness Pt is a 70 y.o. F admitted 3/26 with right sided weakness and dizziness. MRI showing wedge shaped diffusion on the left paramedian pons suspicious for acute infarct. Significant PMH: MS, mitral regurgitation s/p metallic mitral valve replacement, paroxysmal atrial fibrillation, essential hypertension, COPD.   Clinical Impression   Pt PTA: pt lives alone and independent with her cat at home. Pt currently, limited by decreased strength, decreased coordination, and decreased ability to care for self. Speech appears to have resolved, but pt unsteady with mobility and unable to assist with LB ADL in standing due to tneed to hold onto RW. Pt minguardA to minA for short mobility with RW; minA for bed mobility and set-upA to maxA for ADL tasks. Pt tolerating standing ~2 mins for toilet hygiene. Pt would benefit from continued OT skilled services. OT following acutely.    Follow Up Recommendations  CIR    Equipment Recommendations  3 in 1 bedside commode    Recommendations for Other Services Rehab consult     Precautions / Restrictions Precautions Precautions: Fall Restrictions Weight Bearing Restrictions: No      Mobility Bed Mobility Overal bed mobility: Needs Assistance Bed Mobility: Supine to Sit     Supine to sit: Min assist     General bed mobility comments: Increased time, minA to pull self toward EOB for trunk elevation    Transfers Overall transfer level: Needs assistance Equipment used: Rolling walker (2 wheeled) Transfers: Sit to/from Stand Sit to Stand: Min guard         General transfer comment: minguardA with increased time and momentum used. hand placement assist requried    Balance Overall balance assessment: Needs assistance Sitting-balance support: Feet supported Sitting balance-Leahy Scale: Good      Standing balance support: Bilateral upper extremity supported Standing balance-Leahy Scale: Poor Standing balance comment: reliant on external support                           ADL either performed or assessed with clinical judgement   ADL Overall ADL's : Needs assistance/impaired Eating/Feeding: Set up;Sitting   Grooming: Set up;Sitting   Upper Body Bathing: Set up;Sitting   Lower Body Bathing: Maximal assistance;Sitting/lateral leans;Sit to/from stand;Cueing for safety   Upper Body Dressing : Set up;Sitting   Lower Body Dressing: Maximal assistance;Sit to/from stand;Sitting/lateral leans;Cueing for safety   Toilet Transfer: Minimal assistance;Stand-pivot;BSC;RW Toilet Transfer Details (indicate cue type and reason): Increased time and BSC swapped with recliner Toileting- Clothing Manipulation and Hygiene: Maximal assistance;Sitting/lateral lean;Sit to/from stand       Functional mobility during ADLs: Min guard;Cueing for safety;Rolling walker General ADL Comments: Pt limited by decreased strength, decreased coordination, adn decreased ability to care for self. Speech appears to have resolved, but pt unsteady with mobility and unable to assist with LB ADL in standing due to tneed to hold onto RW.     Vision Baseline Vision/History: Wears glasses Wears Glasses: At all times Patient Visual Report: No change from baseline Vision Assessment?: Yes Eye Alignment: Within Functional Limits Ocular Range of Motion: Within Functional Limits Alignment/Gaze Preference: Within Defined Limits     Perception     Praxis      Pertinent Vitals/Pain Pain Assessment: No/denies pain     Hand Dominance Right   Extremity/Trunk Assessment Upper Extremity Assessment Upper Extremity Assessment: Generalized weakness;RUE deficits/detail;LUE  deficits/detail RUE Deficits / Details: 4/5 MM grade RUE Coordination: decreased gross motor LUE Deficits / Details: 4/5 MM grade LUE  Coordination: decreased gross motor   Lower Extremity Assessment Lower Extremity Assessment: Defer to PT evaluation;Generalized weakness   Cervical / Trunk Assessment Cervical / Trunk Assessment: Kyphotic   Communication Communication Communication: No difficulties   Cognition Arousal/Alertness: Awake/alert Behavior During Therapy: WFL for tasks assessed/performed Overall Cognitive Status: Within Functional Limits for tasks assessed                                     General Comments  VSS.    Exercises     Shoulder Instructions      Home Living Family/patient expects to be discharged to:: Private residence Living Arrangements: Alone Available Help at Discharge: Friend(s);Available PRN/intermittently Type of Home: House Home Access: Stairs to enter [employer] of Steps: 4 Entrance Stairs-Rails: Can reach both Home Layout: Two level Alternate Level Stairs-Number of Steps: 14 Alternate Level Stairs-Rails: Can reach both Bathroom Shower/Tub: Occupational psychologist: Standard     Home Equipment: Environmental consultant - 2 wheels;Other (comment);Shower seat - built in;Grab bars - toilet;Grab bars - tub/shower;Cane - single point (tray for RW)   Additional Comments: has a cat      Prior Functioning/Environment Level of Independence: Independent with assistive device(s)        Comments: Does not drive; neighbors take care of pt as needed and assist with transport; lively mobile for fall button. Uses walker        OT Problem List: Decreased strength;Decreased activity tolerance;Impaired balance (sitting and/or standing);Decreased safety awareness;Decreased knowledge of use of DME or AE      OT Treatment/Interventions: Self-care/ADL training;Therapeutic exercise;Energy conservation;DME and/or AE instruction;Therapeutic activities;Visual/perceptual remediation/compensation;Patient/family education;Balance training    OT Goals(Current goals can be  found in the care plan section) Acute Rehab OT Goals Patient Stated Goal: get stronger OT Goal Formulation: With patient Time For Goal Achievement: 01/26/21 Potential to Achieve Goals: Good ADL Goals Pt Will Perform Grooming: with min guard assist;standing Pt Will Transfer to Toilet: with supervision;ambulating;regular height toilet Pt Will Perform Toileting - Clothing Manipulation and hygiene: with min assist;sit to/from stand Pt/caregiver will Perform Home Exercise Program: Increased strength;Both right and left upper extremity;With Supervision Additional ADL Goal #1: Pt will stand for OOB ADL tasks x58mins in order to increase independence.  OT Frequency: Min 2X/week   Barriers to D/C: Decreased caregiver support          Co-evaluation PT/OT/SLP Co-Evaluation/Treatment: Yes Reason for Co-Treatment: Complexity of the patient's impairments (multi-system involvement);For patient/therapist safety   OT goals addressed during session: ADL's and self-care      AM-PAC OT "6 Clicks" Daily Activity     Outcome Measure Help from another person eating meals?: None Help from another person taking care of personal grooming?: A Little Help from another person toileting, which includes using toliet, bedpan, or urinal?: A Lot Help from another person bathing (including washing, rinsing, drying)?: A Lot Help from another person to put on and taking off regular upper body clothing?: A Little Help from another person to put on and taking off regular lower body clothing?: A Lot 6 Click Score: 16   End of Session Equipment Utilized During Treatment: Gait belt;Rolling walker Nurse Communication: Mobility status  Activity Tolerance: Patient limited by fatigue Patient left: in chair;with call bell/phone within reach;with chair alarm set  OT Visit Diagnosis: Unsteadiness on feet (R26.81);Muscle weakness (generalized) (M62.81)                Time: 7026-3785 OT Time Calculation (min): 45  min Charges:  OT General Charges $OT Visit: 1 Visit OT Evaluation $OT Eval Moderate Complexity: 1 Mod  Jefferey Pica, OTR/L Acute Rehabilitation Services Pager: [phone] Office: [phone]  [id number] C 01/12/2021, 2:33 PM

## 2021-01-12 NOTE — Progress Notes (Addendum)
Inpatient Rehab Admissions Coordinator:   I spoke with Pt to discuss potential CIR admission. Pt. Stated interest. She is relatively high level already, but will need to achieve mod I goals to return to PLOF (living alone in a multi-level home). I will await recommendations from PM&R physician consult.   Clemens Catholic, [place], [place] Admissions Coordinator  [phone] ([place]) [phone] (office)

## 2021-01-12 NOTE — Evaluation (Signed)
Physical Therapy Evaluation Patient Details Name: [name] [name] MRN: [id number] DOB: [birth year] Today's Date: 01/12/2021   History of Present Illness  Pt is a 70 y.o. F admitted 3/26 with right sided weakness and dizziness. MRI showing wedge shaped diffusion on the left paramedian pons suspicious for acute infarct. Significant PMH: MS, mitral regurgitation s/p metallic mitral valve replacement, paroxysmal atrial fibrillation, essential hypertension, COPD.  Clinical Impression  Prior to admission, pt lives alone, uses a walker for mobility, and is independent with ADL's. Pt presents with decreased functional mobility secondary to functional weakness, balance deficits, abnormal posture, and decreased endurance. Pt requiring min guard-min assist for functional mobility. Currently, only ambulating x 6 feet with a walker before fatiguing and requiring seated rest break. Will need to progress to modI level prior to return home and negotiate 14 steps to reach her bedroom on second floor. Suspect excellent progress given motivation and PLOF; recommending CIR to address deficits and maximize functional independence.     Follow Up Recommendations CIR    Equipment Recommendations  None recommended by PT    Recommendations for Other Services       Precautions / Restrictions Precautions Precautions: Fall Restrictions Weight Bearing Restrictions: No      Mobility  Bed Mobility Overal bed mobility: Needs Assistance Bed Mobility: Supine to Sit     Supine to sit: Min assist     General bed mobility comments: Light minA to pull trunk to upright position, cues for technique, increased time/effort    Transfers Overall transfer level: Needs assistance Equipment used: Rolling walker (2 wheeled) Transfers: Sit to/from Stand Sit to Stand: Min guard         General transfer comment: Min guard to rise from edge of bed and BSC, cues for hand placement, increased  time/effort  Ambulation/Gait Ambulation/Gait assistance: Min guard Gait Distance (Feet): 6 Feet Assistive device: Rolling walker (2 wheeled) Gait Pattern/deviations: Step-through pattern;Decreased stride length;Trunk flexed;Narrow base of support Gait velocity: decreased Gait velocity interpretation: <1.31 ft/sec, indicative of household ambulator General Gait Details: Min guard assist for balance, increased trunk flexion, fatigues easily requiring a seated rest break  Stairs            Wheelchair Mobility    Modified Rankin (Stroke Patients Only) Modified Rankin (Stroke Patients Only) Pre-Morbid Rankin Score: Slight disability Modified Rankin: Moderately severe disability     Balance Overall balance assessment: Needs assistance Sitting-balance support: Feet supported Sitting balance-Leahy Scale: Good     Standing balance support: Bilateral upper extremity supported Standing balance-Leahy Scale: Poor Standing balance comment: reliant on external support                             Pertinent Vitals/Pain Pain Assessment: No/denies pain    Home Living Family/patient expects to be discharged to:: Private residence Living Arrangements: Alone Available Help at Discharge: Friend(s);Available PRN/intermittently Type of Home: House Home Access: Stairs to enter Entrance Stairs-Rails: Can reach both Entrance Stairs-Number of Steps: 4 Home Layout: Two level Home Equipment: [employer] - 2 wheels;Other (comment);Shower seat - built in;Grab bars - toilet;Grab bars - tub/shower;Cane - single point (tray for RW) Additional Comments: has a cat    Prior Function Level of Independence: Independent with assistive device(s)         Comments: Does not drive; neighbors take care of pt as needed and assist with transport; lively mobile for fall button. Uses walker     Hand Dominance  Dominant Hand: Right    Extremity/Trunk Assessment   Upper Extremity  Assessment Upper Extremity Assessment: Defer to OT evaluation    Lower Extremity Assessment Lower Extremity Assessment: RLE deficits/detail;LLE deficits/detail RLE Deficits / Details: Strength 5/5 LLE Deficits / Details: Strength 5/5    Cervical / Trunk Assessment Cervical / Trunk Assessment: Kyphotic  Communication   Communication: No difficulties  Cognition Arousal/Alertness: Awake/alert Behavior During Therapy: WFL for tasks assessed/performed Overall Cognitive Status: Within Functional Limits for tasks assessed                                        General Comments      Exercises     Assessment/Plan    PT Assessment Patient needs continued PT services  PT Problem List Decreased strength;Decreased activity tolerance;Decreased balance;Decreased mobility       PT Treatment Interventions DME instruction;Gait training;Stair training;Therapeutic activities;Functional mobility training;Therapeutic exercise;Patient/family education;Balance training    PT Goals (Current goals can be found in the Care Plan section)  Acute Rehab PT Goals Patient Stated Goal: get stronger PT Goal Formulation: With patient Time For Goal Achievement: 01/26/21 Potential to Achieve Goals: Good    Frequency Min 4X/week   Barriers to discharge        Co-evaluation PT/OT/SLP Co-Evaluation/Treatment: Yes Reason for Co-Treatment: For patient/therapist safety;To address functional/ADL transfers PT goals addressed during session: Mobility/safety with mobility;Balance;Proper use of DME         AM-PAC PT "6 Clicks" Mobility  Outcome Measure Help needed turning from your back to your side while in a flat bed without using bedrails?: None Help needed moving from lying on your back to sitting on the side of a flat bed without using bedrails?: A Little Help needed moving to and from a bed to a chair (including a wheelchair)?: A Little Help needed standing up from a chair using  your arms (e.g., wheelchair or bedside chair)?: A Little Help needed to walk in hospital room?: A Little Help needed climbing 3-5 steps with a railing? : A Lot 6 Click Score: 18    End of Session Equipment Utilized During Treatment: Gait belt Activity Tolerance: Patient tolerated treatment well Patient left: in chair;with call bell/phone within reach;with chair alarm set Nurse Communication: Mobility status PT Visit Diagnosis: Unsteadiness on feet (R26.81);Muscle weakness (generalized) (M62.81);Difficulty in walking, not elsewhere classified (R26.2)    Time: 5400-8676 PT Time Calculation (min) (ACUTE ONLY): 45 min   Charges:   PT Evaluation $PT Eval Moderate Complexity: 1 Mod PT Treatments $Therapeutic Activity: 8-22 mins        Wyona Almas, PT, DPT Acute Rehabilitation Services Pager [phone] Office [phone]   Deno Etienne 01/12/2021, 9:11 AM

## 2021-01-12 NOTE — Progress Notes (Signed)
Inpatient Rehab Admissions Coordinator Note:   Per therapy recommendations, pt was screened for CIR candidacy by Dariane Natzke, MS CCC-SLP. At this time, Pt. Appears to have functional decline and is a good candidate for CIR. Will place order for rehab consult per protocol.  Please contact me with questions.   Jahara Dail, MS, CCC-SLP Rehab Admissions Coordinator  [phone] (celll) [phone] (office)  

## 2021-01-12 NOTE — Progress Notes (Signed)
ANTICOAGULATION CONSULT NOTE - Initial Consult  Pharmacy Consult for Warfarin Indication: MVR  Allergies  Allergen Reactions  . Erythromycin Nausea And Vomiting  . Valsartan Other (See Comments)    Pt reports caused her depression    Patient Measurements: Height: 5\' 7"  (170.2 cm) Weight: 69.4 kg (153 lb) IBW/kg (Calculated) : 61.6  Vital Signs: Temp: 98.5 F (36.9 C) (03/27 0317) Temp Source: Oral (03/27 0317) BP: 147/61 (03/27 0317) Pulse Rate: 86 (03/27 0317)  Labs: Recent Labs    01/11/21 1427 01/11/21 1440 01/12/21 0207  HGB 13.9 14.3  --   HCT 41.0 42.9  --   PLT  --  263  --   APTT  --  26  --   LABPROT  --  23.8* 24.9*  INR  --  2.2* 2.3*  CREATININE 0.70 0.78  --     Estimated Creatinine Clearance: 62.7 mL/min (by C-G formula based on SCr of 0.78 mg/dL).   Medical History: Past Medical History:  Diagnosis Date  . Anxiety   . Arthritis    knees  . Breast cancer ([facility]) 1999  . CHF (congestive heart failure) ([facility])    Related to severe mitral regurgitation, April, 2013  . 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 Assess by pulmonary team in the hospital April, 2013  . Ejection fraction    EF 60%, echo, April, 2013, with severe MR before mitral valve replacement  . Herpes   . Hypothyroidism   . IBS (irritable bowel syndrome)   . Mitral valve regurgitation    Mitral valve replacement April, 2013, Mitral valve prolapse  . Multiple sclerosis ([facility])   . Neurogenic bladder   . Osteoporosis   . Ovarian cyst   . Paroxysmal atrial fibrillation ([facility])    Rapid atrial fibrillation in-hospital, Rapid cardioversion,  before mitral valve surgery  . Pulmonary hypertension ([place])    Echo, April, 2013, before mitral valve surgery  . S/P Maze operation for atrial fibrillation 01/26/2012   Complete biatrial lesion set using cryothermy via right mini thoracotomy  . S/P mitral valve replacement 01/26/2012   56mm Sorin [employer]  Optiform mechanical prosthesis via right mini thoracotomy  . Warfarin anticoagulation    Mechanical mitral prosthesis, April, 20136    Medications:  Scheduled:  . calcium carbonate  1 tablet Oral Q breakfast  . cholecalciferol  6,000 Units Oral Daily  . meclizine  12.5 mg Oral TID  . metoprolol tartrate  25 mg Oral BID  . thyroid  90 mg Oral Daily  . Warfarin - Pharmacist Dosing Inpatient   Does not apply q1600    Assessment: Patient is a 79 yof that presented to the ED with concerns for a stroke. The patient is on warfarin for a MVR with an INR goal of 2.5-3.5. PTA Regimen: 7.5mg  M /Thur and 5mg  all other days  Patient's INR is currently 2.3 today. CBC stable. Attempting to be more aggressive with warfarin dosing since patient has been subtherapeutic PTA (see amb anticoag flowsheet).  Goal of Therapy:  INR 2.5-3.5 Monitor platelets by anticoagulation protocol: Yes   Plan:  - INR currently subtherapeutic at 2.3 - Plan for Warfarin 7.5mg  PO x 1 dose tonight  - Monitor daily INR and CBC - Monitor patient for s/s of bleeding   Dimple Nanas, PharmD PGY-1 Acute Care Pharmacy Resident Office: [phone] 01/12/2021 7:51 AM

## 2021-01-12 NOTE — Plan of Care (Signed)

## 2021-01-12 NOTE — Progress Notes (Signed)
[facility] PROGRESS NOTE    Progress Note  [name] [name]  GGE:[id number] DOB: 08/14/1951 DOA: 01/11/2021 PCP: Ma Hillock, DO     Brief Narrative:   [name] [name] is an 70 y.o. female past medical history significant for mitral regurgitation status post metallic mitral valve replacement, paroxysmal atrial fibrillation, essential hypertension COPD remote history of MS comes in for aphasia right-sided weakness and feeling dizzy.  Significant studies: 01/11/2021 CT of the head showed no acute hemorrhage. 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 01/11/2021 MRI of the brain showed a wedge-shaped area of diffusion on the left paramedian pons suspicious for acute infarct, some chronic myelinating disease Antibiotics: None  Microbiology data: Blood culture:  Procedures: None  Assessment/Plan:   Acute pontine CVA:  HgbA1c 5, fasting lipid panel LDL greater than 100 HDL 62 MRI, MRA of the brain without contrast as above PT, OT pending, Speech consult  D angio of the head and neck showed high-grade occlusion of the proximal PCA. Transthoracic Echo, previous echo on 05/08/2020 was unremarkable Continue Coumadin per pharmacy Atorvastatin 80  BP goal: permissive HTN upto 220/120 mmHg Telemetry monitoring  History of mitral regurgitation on Coumadin: INR subtherapeutic goal INR 2.5-3.5.  Essential hypertension: Continue metoprolol lisinopril to rule out permissive hypertension.  Frequent PVCs: Continue metoprolol.  Hypothyroidism: Continue Synthroid.  COPD: Stable.   DVT prophylaxis: coumadin Family Communication:none Status is: Inpatient  Remains inpatient appropriate because:Hemodynamically unstable   Dispo: The patient is from: Home              Anticipated d/c is to: Home              Patient currently is not medically stable to d/c.   Difficult  to place patient No  Code Status:     Code Status Orders  (From admission, onward)         Start     Ordered   01/11/21 1920  Full code  Continuous        01/11/21 1920        Code Status History    Date Active Date Inactive Code Status Order ID Comments User Context   01/28/2012 0747 02/03/2012 1840 Full Code [id number]  Rexene Alberts, MD Inpatient   01/26/2012 1858 01/28/2012 0747 Full Code [id number]  Doran Clay, RN Inpatient   01/17/2012 0225 01/26/2012 1858 Full Code [id number]  Toya Smothers, MD Inpatient   Advance Care Planning Activity    Advance Directive Documentation   Flowsheet Row Most Recent Value  Type of Advance Directive Healthcare Power of Attorney  Pre-existing out of facility DNR order (yellow form or pink MOST form) -  "MOST" Form in Place? -        IV Access:    Peripheral IV   Procedures and diagnostic studies:   CT ANGIO HEAD W OR WO CONTRAST  Result Date: 01/11/2021 CLINICAL DATA:  Code stroke EXAM: CT HEAD WITHOUT CONTRAST CT ANGIOGRAPHY OF THE HEAD AND NECK TECHNIQUE: Contiguous axial images were obtained from the base of the skull through the vertex without intravenous contrast. Multidetector CT imaging of the head and neck was performed using the standard protocol during bolus administration of intravenous contrast. Multiplanar CT image reconstructions and MIPs were obtained to evaluate the vascular anatomy. Carotid stenosis measurements (when applicable) are obtained utilizing NASCET criteria, using the distal internal carotid diameter as the denominator. CONTRAST:  73mL OMNIPAQUE IOHEXOL 350 MG/ML SOLN COMPARISON:  None. FINDINGS: CT HEAD Brain: No acute intracranial hemorrhage, mass effect, or edema. Gray-white differentiation is preserved. Patchy hypoattenuation in the supratentorial white matter is nonspecific but probably reflects chronic microvascular ischemic changes. Prominence of the ventricles and sulci reflects generalized parenchymal  volume loss. No extra-axial collection. Vascular: No hyperdense vessel. Skull: Unremarkable. Sinuses/Orbits: No acute abnormality. Other: Mastoid air cells are clear. ASPECTS ([place] Stroke Program Early CT Score) - Ganglionic level infarction (caudate, lentiform nuclei, internal capsule, insula, M1-M3 cortex): 7 - Supraganglionic infarction (M4-M6 cortex): 3 Total score (0-10 with 10 being normal): 10 CTA NECK Aortic arch: Great vessel origins are patent. There is mixed plaque along the innominate extending into the subclavian origin with less than 50% stenosis. Right carotid system: Patent. Atherosclerotic wall thickening with eccentric plaque along the proximal common carotid causing less than 50% stenosis. Minimal calcified plaque at the bifurcation. No stenosis at the ICA origin. Left carotid system: Patent. Mild calcified plaque at the ICA origin without stenosis. Vertebral arteries:Left vertebral artery is patent. Right V1 vertebral artery is not opacified. There is faint enhancement of the right V2 and V3 segments. Skeleton: Degenerative changes of the cervical spine. Other neck: Scarring at the right lung apex with traction bronchiectasis. CTA HEAD Anterior circulation: Intracranial internal carotid arteries are patent with minimal calcified plaque. Anterior and middle cerebral arteries are patent. Posterior circulation: Intracranial vertebral arteries are patent. Improved opacification of the right vertebral artery beyond the PICA origin probably related to retrograde flow. Basilar artery is patent. Posterior cerebral arteries are patent. Bilateral posterior communicating arteries are present, right larger than left. Venous sinuses: Not well evaluated. IMPRESSION: There is no acute intracranial hemorrhage or evidence of acute infarction. ASPECT score is 10. 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  Patent intracranially with greater flow beyond PICA origin likely from retrograde flow. Otherwise, no hemodynamically significant stenosis. Initial results were communicated to Dr. Cheral Marker at 2:39 p.m. on 01/11/2021 by text page via the [facility] messaging system. Electronically Signed   By: Macy Mis M.D.   On: 01/11/2021 14:50   CT ANGIO NECK W OR WO CONTRAST  Result Date: 01/11/2021 CLINICAL DATA:  Code stroke EXAM: CT HEAD WITHOUT CONTRAST CT ANGIOGRAPHY OF THE HEAD AND NECK TECHNIQUE: Contiguous axial images were obtained from the base of the skull through the vertex without intravenous contrast. Multidetector CT imaging of the head and neck was performed using the standard protocol during bolus administration of intravenous contrast. Multiplanar CT image reconstructions and MIPs were obtained to evaluate the vascular anatomy. Carotid stenosis measurements (when applicable) are obtained utilizing NASCET criteria, using the distal internal carotid diameter as the denominator. CONTRAST:  40mL OMNIPAQUE IOHEXOL 350 MG/ML SOLN COMPARISON:  None. FINDINGS: CT HEAD Brain: No acute intracranial hemorrhage, mass effect, or edema. Gray-white differentiation is preserved. Patchy hypoattenuation in the supratentorial white matter is nonspecific but probably reflects chronic microvascular ischemic changes. Prominence of the ventricles and sulci reflects generalized parenchymal volume loss. No extra-axial collection. Vascular: No hyperdense vessel. Skull: Unremarkable. Sinuses/Orbits: No acute abnormality. Other: Mastoid air cells are clear. ASPECTS ([place] Stroke Program Early CT Score) - Ganglionic level infarction (caudate, lentiform nuclei, internal capsule, insula, M1-M3 cortex): 7 - Supraganglionic infarction (M4-M6 cortex): 3 Total score (0-10 with 10 being normal): 10 CTA NECK Aortic arch: Great vessel origins are patent. There is mixed plaque along the innominate extending into the subclavian  origin with less  than 50% stenosis. Right carotid system: Patent. Atherosclerotic wall thickening with eccentric plaque along the proximal common carotid causing less than 50% stenosis. Minimal calcified plaque at the bifurcation. No stenosis at the ICA origin. Left carotid system: Patent. Mild calcified plaque at the ICA origin without stenosis. Vertebral arteries:Left vertebral artery is patent. Right V1 vertebral artery is not opacified. There is faint enhancement of the right V2 and V3 segments. Skeleton: Degenerative changes of the cervical spine. Other neck: Scarring at the right lung apex with traction bronchiectasis. CTA HEAD Anterior circulation: Intracranial internal carotid arteries are patent with minimal calcified plaque. Anterior and middle cerebral arteries are patent. Posterior circulation: Intracranial vertebral arteries are patent. Improved opacification of the right vertebral artery beyond the PICA origin probably related to retrograde flow. Basilar artery is patent. Posterior cerebral arteries are patent. Bilateral posterior communicating arteries are present, right larger than left. Venous sinuses: Not well evaluated. IMPRESSION: There is no acute intracranial hemorrhage or evidence of acute infarction. ASPECT score is 10. 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 Patent intracranially with greater flow beyond PICA origin likely from retrograde flow. Otherwise, no hemodynamically significant stenosis. Initial results were communicated to Dr. Cheral Marker at 2:39 p.m. on 01/11/2021 by text page via the [facility] messaging system. Electronically Signed   By: Macy Mis M.D.   On: 01/11/2021 14:50   MR BRAIN W WO CONTRAST  Addendum Date: 01/11/2021   ADDENDUM REPORT: 01/11/2021 18:58 ADDENDUM: There is a wedge-shaped area of diffusion hyperintensity at the left paramedian pons without corresponding T2  hyperintensity suspicious for acute perforator infarct. Updated findings were discussed with Dr. Cheral Marker on 01/11/2021 at 6:50 p.m. Punctate focus of diffusion hyperintensity in the posterior left cerebellum is without correlate on coronal DWI. Electronically Signed   By: Macy Mis M.D.   On: 01/11/2021 18:58   Result Date: 01/11/2021 CLINICAL DATA:  Code stroke follow-up, history of multiple sclerosis EXAM: MRI HEAD WITHOUT AND WITH CONTRAST TECHNIQUE: Multiplanar, multiecho pulse sequences of the brain and surrounding structures were obtained without and with intravenous contrast. CONTRAST:  54mL GADAVIST GADOBUTROL 1 MMOL/ML IV SOLN COMPARISON:  12/14/2019 FINDINGS: Brain: There is no acute infarction or intracranial hemorrhage. There is no intracranial mass, mass effect, or edema. There is no hydrocephalus or extra-axial fluid collection. Foci of T2 hyperintensity are present in the periventricular greater than subcortical white matter consistent with history of multiple sclerosis. A few small foci are also present within the cerebellum. No change since the prior study. Prominence of the ventricles and sulci reflects stable parenchymal volume loss. No abnormal enhancement. Vascular: Major vessel flow voids at the skull base are preserved. Skull and upper cervical spine: Normal marrow signal is preserved. Sinuses/Orbits: Minor mucosal thickening.  Orbits are unremarkable. Other: Sella is unremarkable.  Mastoid air cells are clear. IMPRESSION: No acute infarction. Stable burden of chronic demyelinating disease. No evidence of active demyelination. Stable parenchymal volume loss. Electronically Signed: By: Macy Mis M.D. On: 01/11/2021 17:16   CT HEAD CODE STROKE WO CONTRAST  Result Date: 01/11/2021 CLINICAL DATA:  Code stroke EXAM: CT HEAD WITHOUT CONTRAST CT ANGIOGRAPHY OF THE HEAD AND NECK TECHNIQUE: Contiguous axial images were obtained from the base of the skull through the vertex without  intravenous contrast. Multidetector CT imaging of the head and neck was performed using the standard protocol during bolus administration of intravenous contrast. Multiplanar CT image reconstructions and  MIPs were obtained to evaluate the vascular anatomy. Carotid stenosis measurements (when applicable) are obtained utilizing NASCET criteria, using the distal internal carotid diameter as the denominator. CONTRAST:  82mL OMNIPAQUE IOHEXOL 350 MG/ML SOLN COMPARISON:  None. FINDINGS: CT HEAD Brain: No acute intracranial hemorrhage, mass effect, or edema. Gray-white differentiation is preserved. Patchy hypoattenuation in the supratentorial white matter is nonspecific but probably reflects chronic microvascular ischemic changes. Prominence of the ventricles and sulci reflects generalized parenchymal volume loss. No extra-axial collection. Vascular: No hyperdense vessel. Skull: Unremarkable. Sinuses/Orbits: No acute abnormality. Other: Mastoid air cells are clear. ASPECTS ([place] Stroke Program Early CT Score) - Ganglionic level infarction (caudate, lentiform nuclei, internal capsule, insula, M1-M3 cortex): 7 - Supraganglionic infarction (M4-M6 cortex): 3 Total score (0-10 with 10 being normal): 10 CTA NECK Aortic arch: Great vessel origins are patent. There is mixed plaque along the innominate extending into the subclavian origin with less than 50% stenosis. Right carotid system: Patent. Atherosclerotic wall thickening with eccentric plaque along the proximal common carotid causing less than 50% stenosis. Minimal calcified plaque at the bifurcation. No stenosis at the ICA origin. Left carotid system: Patent. Mild calcified plaque at the ICA origin without stenosis. Vertebral arteries:Left vertebral artery is patent. Right V1 vertebral artery is not opacified. There is faint enhancement of the right V2 and V3 segments. Skeleton: Degenerative changes of the cervical spine. Other neck: Scarring at the right lung apex with  traction bronchiectasis. CTA HEAD Anterior circulation: Intracranial internal carotid arteries are patent with minimal calcified plaque. Anterior and middle cerebral arteries are patent. Posterior circulation: Intracranial vertebral arteries are patent. Improved opacification of the right vertebral artery beyond the PICA origin probably related to retrograde flow. Basilar artery is patent. Posterior cerebral arteries are patent. Bilateral posterior communicating arteries are present, right larger than left. Venous sinuses: Not well evaluated. IMPRESSION: There is no acute intracranial hemorrhage or evidence of acute infarction. ASPECT score is 10. 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 Patent intracranially with greater flow beyond PICA origin likely from retrograde flow. Otherwise, no hemodynamically significant stenosis. Initial results were communicated to Dr. Cheral Marker at 2:39 p.m. on 01/11/2021 by text page via the [facility] messaging system. Electronically Signed   By: Macy Mis M.D.   On: 01/11/2021 14:50     Medical Consultants:    None.   Subjective:    [name] [name] in a good mood this morning no new complaints.  Objective:    Vitals:   01/11/21 2205 01/11/21 2304 01/12/21 0145 01/12/21 0317  BP: 132/79 (!) 142/64 (!) 152/72 (!) 147/61  Pulse: 81 71 76 86  Resp: 18 18 18 18   Temp: 98.6 F (37 C) 98.7 F (37.1 C) 98 F (36.7 C) 98.5 F (36.9 C)  TempSrc: Oral Oral Oral Oral  SpO2: 100% 97% 97% 96%  Weight:      Height:       SpO2: 96 %   Intake/Output Summary (Last 24 hours) at 01/12/2021 0713 Last data filed at 01/12/2021 0518 Gross per 24 hour  Intake 512.35 ml  Output 350 ml  Net 162.35 ml   Filed Weights   01/11/21 1437  Weight: 69.4 kg    Exam: General exam: In no acute distress. Respiratory system: Good air movement and clear to  auscultation. Cardiovascular system: S1 & S2 heard, RRR. No JVD. Gastrointestinal system: Abdomen is nondistended, soft and nontender.  Extremities: No pedal edema. Skin:  No rashes, lesions or ulcers Psychiatry: Judgement and insight appear normal. Mood & affect appropriate.    Data Reviewed:    Labs: Basic Metabolic Panel: Recent Labs  Lab 01/11/21 1427 01/11/21 1440  NA 138 137  K 3.5 3.6  CL 102 101  CO2  --  25  GLUCOSE 143* 146*  BUN 27* 27*  CREATININE 0.70 0.78  CALCIUM  --  9.1   GFR Estimated Creatinine Clearance: 62.7 mL/min (by C-G formula based on SCr of 0.78 mg/dL). Liver Function Tests: Recent Labs  Lab 01/11/21 1440  AST 25  ALT 20  ALKPHOS 72  BILITOT 0.8  PROT 6.8  ALBUMIN 3.3*   No results for input(s): LIPASE, AMYLASE in the last 168 hours. No results for input(s): AMMONIA in the last 168 hours. Coagulation profile Recent Labs  Lab 01/11/21 1440 01/12/21 0207  INR 2.2* 2.3*   COVID-19 Labs  No results for input(s): DDIMER, FERRITIN, LDH, CRP in the last 72 hours.  Lab Results  Component Value Date   [place] NEGATIVE 01/11/2021    CBC: Recent Labs  Lab 01/11/21 1427 01/11/21 1440  WBC  --  6.5  NEUTROABS  --  3.5  HGB 13.9 14.3  HCT 41.0 42.9  MCV  --  95.3  PLT  --  263   Cardiac Enzymes: No results for input(s): CKTOTAL, CKMB, CKMBINDEX, TROPONINI in the last 168 hours. BNP (last 3 results) No results for input(s): PROBNP in the last 8760 hours. CBG: Recent Labs  Lab 01/11/21 1421  GLUCAP 89   D-Dimer: No results for input(s): DDIMER in the last 72 hours. Hgb A1c: Recent Labs    01/12/21 0207  HGBA1C 5.5   Lipid Profile: Recent Labs    01/12/21 0207  CHOL 204*  HDL 62  LDLCALC 131*  TRIG 53  CHOLHDL 3.3   Thyroid function studies: Recent Labs    01/11/21 02-02-2035  TSH 0.371   Anemia work up: No results for input(s): VITAMINB12, FOLATE, FERRITIN, TIBC, IRON, RETICCTPCT in the last 72  hours. Sepsis Labs: Recent Labs  Lab 01/11/21 1440  WBC 6.5   Microbiology Recent Results (from the past 240 hour(s))  Resp Panel by RT-PCR (Flu A&B, Covid) Nasopharyngeal Swab     Status: None   Collection Time: 01/11/21  3:21 PM   Specimen: Nasopharyngeal Swab; Nasopharyngeal(NP) swabs in vial transport medium  Result Value Ref Range Status   SARS Coronavirus 2 by RT PCR NEGATIVE NEGATIVE Final    Comment: (NOTE) SARS-CoV-2 target nucleic acids are NOT DETECTED.  The SARS-CoV-2 RNA is generally detectable in upper respiratory specimens during the acute phase of infection. The lowest concentration of SARS-CoV-2 viral copies this assay can detect is 138 copies/mL. A negative result does not preclude SARS-Cov-2 infection and should not be used as the sole basis for treatment or other patient management decisions. A negative result may occur with  improper specimen collection/handling, submission of specimen other than nasopharyngeal swab, presence of viral mutation(s) within the areas targeted by this assay, and inadequate number of viral copies(<138 copies/mL). A negative result must be combined with clinical observations, patient history, and epidemiological information. The expected result is Negative.  Fact Sheet for Patients:  EntrepreneurPulse.com.au  Fact Sheet for Healthcare Providers:  IncredibleEmployment.be  This test is no t yet approved or cleared by the [place] FDA and  has been authorized for detection and/or diagnosis of SARS-CoV-2 by FDA under an Emergency Use Authorization (EUA). This EUA will  remain  in effect (meaning this test can be used) for the duration of the COVID-19 declaration under Section 564(b)(1) of the Act, 21 U.S.C.section 360bbb-3(b)(1), unless the authorization is terminated  or revoked sooner.       Influenza A by PCR NEGATIVE NEGATIVE Final   Influenza B by PCR NEGATIVE NEGATIVE Final     Comment: (NOTE) The Xpert Xpress SARS-CoV-2/FLU/RSV plus assay is intended as an aid in the diagnosis of influenza from Nasopharyngeal swab specimens and should not be used as a sole basis for treatment. Nasal washings and aspirates are unacceptable for Xpert Xpress SARS-CoV-2/FLU/RSV testing.  Fact Sheet for Patients: EntrepreneurPulse.com.au  Fact Sheet for Healthcare Providers: IncredibleEmployment.be  This test is not yet approved or cleared by the [place] FDA and has been authorized for detection and/or diagnosis of SARS-CoV-2 by FDA under an Emergency Use Authorization (EUA). This EUA will remain in effect (meaning this test can be used) for the duration of the COVID-19 declaration under Section 564(b)(1) of the Act, 21 U.S.C. section 360bbb-3(b)(1), unless the authorization is terminated or revoked.  Performed at [facility], [place] [address] [place], [place] [address]      Medications:   . calcium carbonate  1 tablet Oral Q breakfast  . cholecalciferol  6,000 Units Oral Daily  . meclizine  12.5 mg Oral TID  . metoprolol tartrate  25 mg Oral BID  . thyroid  90 mg Oral Daily  . Warfarin - Pharmacist Dosing Inpatient   Does not apply q1600   Continuous Infusions: . sodium chloride 100 mL/hr at 01/11/21 2126      LOS: 1 day   Charlynne Cousins  [facility]  01/12/2021, 7:13 AM

## 2021-01-12 NOTE — Consult Note (Incomplete)
Physical Medicine and Rehabilitation Consult Reason for Consult: Acute onset right side weakness and aphasia Referring Physician: Dr. Lovell Sheehan   HPI: [name] [name] is a 70 y.o. right-handed female with history of severe mitral regurgitation status post metal mitral valve replacement 2013 maintained on chronic Coumadin., PAF, hypertension, COPD, remote history of MS with neurogenic bladder, diastolic congestive heart failure, IBS.  Per chart review patient lives alone.  Independent with assistive device.  Two-level home 4 steps to entry.  She does not drive.  Neighbors help provide transportation to appointments.  Presented 01/11/2021 with acute onset of right-sided weakness and aphasia.  Cranial CT scan showed no acute intracranial hemorrhage or evidence of acute infarction.  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  No hemodynamically significant stenosis.  Patient did not receive TPA.  MRI showed a wedge-shaped area diffusion on the left paramedian pons suspicious for acute infarction.  Stable burden of chronic demyelinating disease.  No evidence of active demyelination.  MRI cervical spine no cord signal abnormality or impingement.  Admission chemistries unremarkable except BUN 27 glucose 143, INR 2.2, hemoglobin 14.3, urinalysis positive nitrite.  Echocardiogram with ejection fraction of 65 to 70% no wall motion abnormalities.  Patient presently remains on Coumadin as prior to admission with the addition of low-dose aspirin.  Tolerating a regular consistency diet.  Therapy evaluations completed due to patient's right side weakness and aphasia recommendations of physical medicine rehab consult.   Review of Systems  Constitutional: Negative for chills and fever.  HENT: Negative for hearing loss.   Eyes: Negative for blurred vision and double vision.   Respiratory: Negative for cough.        Shortness of breath with exertion  Cardiovascular: Positive for palpitations. Negative for chest pain and leg swelling.  Gastrointestinal: Positive for constipation. Negative for heartburn, nausea and vomiting.  Genitourinary: Positive for urgency. Negative for dysuria and hematuria.  Musculoskeletal: Positive for joint pain and myalgias.  Skin: Negative for rash.  Neurological: Positive for dizziness, speech change and weakness.  Psychiatric/Behavioral: The patient has insomnia.        Anxiety  All other systems reviewed and are negative.  Past Medical History:  Diagnosis Date  . Anxiety   . Arthritis    knees  . Breast cancer ([place]) 1999  . CHF (congestive heart failure) ([place])    Related to severe mitral regurgitation, April, 2013  . 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 Assess by pulmonary team in the hospital April, 2013  . Ejection fraction    EF 60%, echo, April, 2013, with severe MR before mitral valve replacement  . Herpes   . Hypothyroidism   . IBS (irritable bowel syndrome)   . Mitral valve regurgitation    Mitral valve replacement April, 2013, Mitral valve prolapse  . Multiple sclerosis ([place])   . Neurogenic bladder   . Osteoporosis   . Ovarian cyst   . Paroxysmal atrial fibrillation (HCC)    Rapid atrial fibrillation in-hospital, Rapid cardioversion,  before mitral valve surgery  . Pulmonary hypertension ([place])    Echo, April, 2013, before mitral valve surgery  . S/P Maze operation for atrial fibrillation 01/26/2012   Complete biatrial lesion set using cryothermy via right mini thoracotomy  . S/P mitral valve replacement 01/26/2012   65mm Sorin [employer]omedics Optiform mechanical prosthesis via right mini thoracotomy  .  Warfarin anticoagulation    Mechanical mitral prosthesis, April, 20136   Past Surgical History:  Procedure Laterality Date  . BREAST LUMPECTOMY Right 1999   with sent.node,  and axillary dissection (20)  . CHEST TUBE INSERTION  01/26/2012   Procedure: CHEST TUBE INSERTION;  Surgeon: Rexene Alberts, MD;  Location: [facility];  Service: Open Heart Surgery;  Laterality: Left;  . COLONOSCOPY  2010   "normal"  . CYSTOSCOPY  1992  . LAPAROSCOPIC OVARIAN CYSTECTOMY  1978   urethral stricture repair  . LEFT AND RIGHT HEART CATHETERIZATION WITH CORONARY ANGIOGRAM N/A 01/20/2012   Procedure: LEFT AND RIGHT HEART CATHETERIZATION WITH CORONARY ANGIOGRAM;  Surgeon: Burnell Blanks, MD;  Location: [facility] CATH LAB;  Service: Cardiovascular;  Laterality: N/A;  . LYMPHADENECTOMY    . MAZE  01/26/2012   Procedure: MAZE;  Surgeon: Rexene Alberts, MD;  Location: [place];  Service: Open Heart Surgery;  Laterality: N/A;  . MITRAL VALVE REPLACEMENT  01/26/2012   Procedure: MINIMALLY INVASIVE MITRAL VALVE (MV) REPLACEMENT;  Surgeon: Rexene Alberts, MD;  Location: [place];  Service: Open Heart Surgery;  Laterality: Right;  . TEE WITHOUT CARDIOVERSION  01/19/2012   Procedure: TRANSESOPHAGEAL ECHOCARDIOGRAM (TEE);  Surgeon: Peter M Martinique, MD;  Location: [facility] ENDOSCOPY;  Service: Cardiovascular;  Laterality: N/A;  . TONSILLECTOMY  1970  . [place]  . WRIST SURGERY Right 2012   Family History  Problem Relation Age of Onset  . Heart disease Father        cardiac arrest   . CAD Father   . Hypertension Father   . CAD Mother        5 stents and numerous bypass surgery  . Hypertension Mother   . Hyperlipidemia Mother   . CAD Other   . Breast cancer Maternal Grandmother   . Breast cancer Maternal Aunt    Social History:  reports that she has never smoked. She has never used smokeless tobacco. She reports that she does not drink alcohol and does not use drugs. Allergies:  Allergies  Allergen Reactions  . Erythromycin Nausea And Vomiting  . Valsartan Other (See Comments)    Pt reports caused her depression   Medications Prior to Admission  Medication Sig Dispense Refill   . ARMOUR THYROID 90 MG tablet Take 90 mg by mouth daily.    . Biotin 5000 MCG CAPS Take 10,000 mcg by mouth in the morning and at bedtime.    . Black Cohosh 40 MG CAPS Take 40 mg by mouth every evening.    . Bromelains (BROMELAIN PO) Take 1 capsule by mouth 2 (two) times daily.    . calcium carbonate (OSCAL) 1500 (600 Ca) MG TABS tablet Take 1,500 mg by mouth daily. Takes 1 time daily    . Cholecalciferol (VITAMIN D3) 2000 units capsule Take 4,000 Units by mouth daily.    . Coenzyme Q10 (CO Q-10) 100 MG CAPS Take 1 capsule by mouth daily.     . Cranberry 500 MG CAPS Take 1 capsule by mouth daily.    Marland Kitchen L-THEANINE PO Take 1 capsule by mouth daily.     Marland Kitchen lisinopril (ZESTRIL) 5 MG tablet Take 1 tablet (5 mg total) by mouth daily. 90 tablet 1  . Lysine 500 MG CAPS Take 1 capsule by mouth daily.    . Magnesium Citrate 200 MG TABS Take 200 mg by mouth daily.    . metoprolol tartrate (LOPRESSOR) 25 MG tablet TAKE 1  TAB (25 MG) PO TWICE DAILY - pt must make appt with provider for further refills - 1st attempt (Patient taking differently: Take 25 mg by mouth 2 (two) times daily.) 180 tablet 3  . MILK THISTLE PO Take 1 capsule by mouth daily.    . Misc Natural Products (GLUCOSAMINE CHOND COMPLEX/MSM PO) Take 1 tablet by mouth in the morning and at bedtime. Strength of dose Glucosamine 1500mg  /Chodroitron 1000mg / MSM 500mg  takes one twice daily    . Olive Leaf 500 MG CAPS Take 1 capsule by mouth 2 (two) times daily.     . Petasin (PETADOLEX PO) Take 1 tablet by mouth 2 (two) times daily.    . Probiotic Product (PROBIOTIC DAILY PO) Take 1 capsule by mouth daily. Take 1 capsule once a day    . PROGESTERONE MICRONIZED PO Take 100 mg by mouth daily.     . Pumpkin Seed 500 MG TABS Take 1 tablet by mouth 2 (two) times daily.    . TURMERIC PO Take 1 capsule by mouth daily.    . vitamin A 10000 UNIT capsule Take 10,000 Units by mouth daily.    . vitamin B-12 (CYANOCOBALAMIN) 1000 MCG tablet Take 1,000 mcg by  mouth daily. Taking sublingal    . vitamin E 400 UNIT capsule Take 400 Units by mouth daily.    Marland Kitchen warfarin (COUMADIN) 5 MG tablet TAKE 1 TABLET DAILY EXCEPT 1.5 TABLETS ON MONDAY AND THURSDAY  BY MOUTH EVERY DAY OR AS DIRECTED BY COUMADIN CLINIC (Patient taking differently: Take 5-7.5 mg by mouth as directed. Take 1.5 tablet (7.5 mg) on (Mon,Thurs) & Take 1 tablet (5 mg) All Other Days As Directed By Coumadin Clinic) 110 tablet 0  . amoxicillin (AMOXIL) 500 MG capsule Take 2,000 mg by mouth once.  0  . lactose free nutrition (BOOST PLUS) LIQD Take 237 mLs by mouth daily.     Marland Kitchen lidocaine-prilocaine (EMLA) cream Apply 1 application topically as needed (per pt this is for use before blood draws).   0    Home: Home Living Family/patient expects to be discharged to:: Private residence Living Arrangements: Alone Available Help at Discharge: Friend(s),Available PRN/intermittently Type of Home: House Home Access: Stairs to enter Technical brewer of Steps: 4 Entrance Stairs-Rails: Can reach both Home Layout: Two level Alternate Level Stairs-Number of Steps: 14 Alternate Level Stairs-Rails: Can reach both Bathroom Shower/Tub: Multimedia programmer: Standard Home Equipment: Environmental consultant - 2 wheels,Other (comment),Shower seat - built in,Grab bars - toilet,Grab bars - tub/shower,Cane - single point (tray for RW) Additional Comments: has a cat  Functional History: Prior Function Level of Independence: Independent with assistive device(s) Comments: Does not drive; neighbors take care of pt as needed and assist with transport; lively mobile for fall button. Uses walker Functional Status:  Mobility: Bed Mobility Overal bed mobility: Needs Assistance Bed Mobility: Supine to Sit Supine to sit: Min assist General bed mobility comments: Light minA to pull trunk to upright position, cues for technique, increased time/effort Transfers Overall transfer level: Needs assistance Equipment used:  Rolling walker (2 wheeled) Transfers: Sit to/from Stand Sit to Stand: Min guard General transfer comment: Min guard to rise from edge of bed and BSC, cues for hand placement, increased time/effort Ambulation/Gait Ambulation/Gait assistance: Min guard Gait Distance (Feet): 6 Feet Assistive device: Rolling walker (2 wheeled) Gait Pattern/deviations: Step-through pattern,Decreased stride length,Trunk flexed,Narrow base of support General Gait Details: Min guard assist for balance, increased trunk flexion, fatigues easily requiring a seated rest break Gait  velocity: decreased Gait velocity interpretation: <1.31 ft/sec, indicative of household ambulator    ADL:    Cognition: Cognition Overall Cognitive Status: Within Functional Limits for tasks assessed Orientation Level: Oriented X4 Cognition Arousal/Alertness: Awake/alert Behavior During Therapy: WFL for tasks assessed/performed Overall Cognitive Status: Within Functional Limits for tasks assessed  Blood pressure (!) 117/97, pulse 99, temperature (!) 97.5 F (36.4 C), temperature source Oral, resp. rate 18, height 5\' 7"  (1.702 m), weight 69.4 kg, SpO2 97 %. Physical Exam Neurological:     Comments: Patient is alert no acute distress.  Makes eye contact with examiner.  Speech was of low tone but she was able to provide her name and place and was able to speak some short phrases.  She does follow simple commands.     Results for orders placed or performed during the hospital encounter of 01/11/21 (from the past 24 hour(s))  CBG monitoring, ED     Status: None   Collection Time: 01/11/21  2:21 PM  Result Value Ref Range   Glucose-Capillary 89 70 - 99 mg/dL  I-stat chem 8, ED     Status: Abnormal   Collection Time: 01/11/21  2:27 PM  Result Value Ref Range   Sodium 138 135 - 145 mmol/L   Potassium 3.5 3.5 - 5.1 mmol/L   Chloride 102 98 - 111 mmol/L   BUN 27 (H) 8 - 23 mg/dL   Creatinine, Ser 0.70 0.44 - 1.00 mg/dL   Glucose,  Bld 143 (H) 70 - 99 mg/dL   Calcium, Ion 1.04 (L) 1.15 - 1.40 mmol/L   TCO2 25 22 - 32 mmol/L   Hemoglobin 13.9 12.0 - 15.0 g/dL   HCT 41.0 36.0 - 46.0 %  Protime-INR     Status: Abnormal   Collection Time: 01/11/21  2:40 PM  Result Value Ref Range   Prothrombin Time 23.8 (H) 11.4 - 15.2 seconds   INR 2.2 (H) 0.8 - 1.2  APTT     Status: None   Collection Time: 01/11/21  2:40 PM  Result Value Ref Range   aPTT 26 24 - 36 seconds  CBC     Status: None   Collection Time: 01/11/21  2:40 PM  Result Value Ref Range   WBC 6.5 4.0 - 10.5 K/uL   RBC 4.50 3.87 - 5.11 MIL/uL   Hemoglobin 14.3 12.0 - 15.0 g/dL   HCT 42.9 36.0 - 46.0 %   MCV 95.3 80.0 - 100.0 fL   MCH 31.8 26.0 - 34.0 pg   MCHC 33.3 30.0 - 36.0 g/dL   RDW 13.1 11.5 - 15.5 %   Platelets 263 150 - 400 K/uL   nRBC 0.0 0.0 - 0.2 %  Differential     Status: None   Collection Time: 01/11/21  2:40 PM  Result Value Ref Range   Neutrophils Relative % 53 %   Neutro Abs 3.5 1.7 - 7.7 K/uL   Lymphocytes Relative 35 %   Lymphs Abs 2.2 0.7 - 4.0 K/uL   Monocytes Relative 10 %   Monocytes Absolute 0.6 0.1 - 1.0 K/uL   Eosinophils Relative 1 %   Eosinophils Absolute 0.1 0.0 - 0.5 K/uL   Basophils Relative 1 %   Basophils Absolute 0.0 0.0 - 0.1 K/uL   Immature Granulocytes 0 %   Abs Immature Granulocytes 0.01 0.00 - 0.07 K/uL  Comprehensive metabolic panel     Status: Abnormal   Collection Time: 01/11/21  2:40 PM  Result Value Ref Range  Sodium 137 135 - 145 mmol/L   Potassium 3.6 3.5 - 5.1 mmol/L   Chloride 101 98 - 111 mmol/L   CO2 25 22 - 32 mmol/L   Glucose, Bld 146 (H) 70 - 99 mg/dL   BUN 27 (H) 8 - 23 mg/dL   Creatinine, Ser 0.78 0.44 - 1.00 mg/dL   Calcium 9.1 8.9 - 10.3 mg/dL   Total Protein 6.8 6.5 - 8.1 g/dL   Albumin 3.3 (L) 3.5 - 5.0 g/dL   AST 25 15 - 41 U/L   ALT 20 0 - 44 U/L   Alkaline Phosphatase 72 38 - 126 U/L   Total Bilirubin 0.8 0.3 - 1.2 mg/dL   GFR, Estimated >60 >60 mL/min   Anion gap 11 5 - 15   Resp Panel by RT-PCR (Flu A&B, Covid) Nasopharyngeal Swab     Status: None   Collection Time: 01/11/21  3:21 PM   Specimen: Nasopharyngeal Swab; Nasopharyngeal(NP) swabs in vial transport medium  Result Value Ref Range   SARS Coronavirus 2 by RT PCR NEGATIVE NEGATIVE   Influenza A by PCR NEGATIVE NEGATIVE   Influenza B by PCR NEGATIVE NEGATIVE  Urinalysis, Routine w reflex microscopic     Status: Abnormal   Collection Time: 01/11/21  7:04 PM  Result Value Ref Range   Color, Urine YELLOW YELLOW   APPearance HAZY (A) CLEAR   Specific Gravity, Urine 1.042 (H) 1.005 - 1.030   pH 6.0 5.0 - 8.0   Glucose, UA NEGATIVE NEGATIVE mg/dL   Hgb urine dipstick SMALL (A) NEGATIVE   Bilirubin Urine NEGATIVE NEGATIVE   Ketones, ur 20 (A) NEGATIVE mg/dL   Protein, ur NEGATIVE NEGATIVE mg/dL   Nitrite POSITIVE (A) NEGATIVE   Leukocytes,Ua TRACE (A) NEGATIVE   RBC / HPF 0-5 0 - 5 RBC/hpf   WBC, UA 6-10 0 - 5 WBC/hpf   Bacteria, UA RARE (A) NONE SEEN   Squamous Epithelial / LPF 0-5 0 - 5  TSH     Status: None   Collection Time: 01/11/21  8:36 PM  Result Value Ref Range   TSH 0.371 0.350 - 4.500 uIU/mL  Hemoglobin A1c     Status: None   Collection Time: 01/12/21  2:07 AM  Result Value Ref Range   Hgb A1c MFr Bld 5.5 4.8 - 5.6 %   Mean Plasma Glucose 111.15 mg/dL  Lipid panel     Status: Abnormal   Collection Time: 01/12/21  2:07 AM  Result Value Ref Range   Cholesterol 204 (H) 0 - 200 mg/dL   Triglycerides 53 <150 mg/dL   HDL 62 >40 mg/dL   Total CHOL/HDL Ratio 3.3 RATIO   VLDL 11 0 - 40 mg/dL   LDL Cholesterol 131 (H) 0 - 99 mg/dL  Protime-INR     Status: Abnormal   Collection Time: 01/12/21  2:07 AM  Result Value Ref Range   Prothrombin Time 24.9 (H) 11.4 - 15.2 seconds   INR 2.3 (H) 0.8 - 1.2   CT ANGIO HEAD W OR WO CONTRAST  Result Date: 01/11/2021 CLINICAL DATA:  Code stroke EXAM: CT HEAD WITHOUT CONTRAST CT ANGIOGRAPHY OF THE HEAD AND NECK TECHNIQUE: Contiguous axial images were  obtained from the base of the skull through the vertex without intravenous contrast. Multidetector CT imaging of the head and neck was performed using the standard protocol during bolus administration of intravenous contrast. Multiplanar CT image reconstructions and MIPs were obtained to evaluate the vascular anatomy. Carotid stenosis measurements (  when applicable) are obtained utilizing NASCET criteria, using the distal internal carotid diameter as the denominator. CONTRAST:  39mL OMNIPAQUE IOHEXOL 350 MG/ML SOLN COMPARISON:  None. FINDINGS: CT HEAD Brain: No acute intracranial hemorrhage, mass effect, or edema. Gray-white differentiation is preserved. Patchy hypoattenuation in the supratentorial white matter is nonspecific but probably reflects chronic microvascular ischemic changes. Prominence of the ventricles and sulci reflects generalized parenchymal volume loss. No extra-axial collection. Vascular: No hyperdense vessel. Skull: Unremarkable. Sinuses/Orbits: No acute abnormality. Other: Mastoid air cells are clear. ASPECTS ([place] Stroke Program Early CT Score) - Ganglionic level infarction (caudate, lentiform nuclei, internal capsule, insula, M1-M3 cortex): 7 - Supraganglionic infarction (M4-M6 cortex): 3 Total score (0-10 with 10 being normal): 10 CTA NECK Aortic arch: Great vessel origins are patent. There is mixed plaque along the innominate extending into the subclavian origin with less than 50% stenosis. Right carotid system: Patent. Atherosclerotic wall thickening with eccentric plaque along the proximal common carotid causing less than 50% stenosis. Minimal calcified plaque at the bifurcation. No stenosis at the ICA origin. Left carotid system: Patent. Mild calcified plaque at the ICA origin without stenosis. Vertebral arteries:Left vertebral artery is patent. Right V1 vertebral artery is not opacified. There is faint enhancement of the right V2 and V3 segments. Skeleton: Degenerative changes of the  cervical spine. Other neck: Scarring at the right lung apex with traction bronchiectasis. CTA HEAD Anterior circulation: Intracranial internal carotid arteries are patent with minimal calcified plaque. Anterior and middle cerebral arteries are patent. Posterior circulation: Intracranial vertebral arteries are patent. Improved opacification of the right vertebral artery beyond the PICA origin probably related to retrograde flow. Basilar artery is patent. Posterior cerebral arteries are patent. Bilateral posterior communicating arteries are present, right larger than left. Venous sinuses: Not well evaluated. IMPRESSION: There is no acute intracranial hemorrhage or evidence of acute infarction. ASPECT score is 10. 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 Patent intracranially with greater flow beyond PICA origin likely from retrograde flow. Otherwise, no hemodynamically significant stenosis. Initial results were communicated to Dr. Cheral Marker at 2:39 p.m. on 01/11/2021 by text page via the [facility] messaging system. Electronically Signed   By: Macy Mis M.D.   On: 01/11/2021 14:50   CT ANGIO NECK W OR WO CONTRAST  Result Date: 01/11/2021 CLINICAL DATA:  Code stroke EXAM: CT HEAD WITHOUT CONTRAST CT ANGIOGRAPHY OF THE HEAD AND NECK TECHNIQUE: Contiguous axial images were obtained from the base of the skull through the vertex without intravenous contrast. Multidetector CT imaging of the head and neck was performed using the standard protocol during bolus administration of intravenous contrast. Multiplanar CT image reconstructions and MIPs were obtained to evaluate the vascular anatomy. Carotid stenosis measurements (when applicable) are obtained utilizing NASCET criteria, using the distal internal carotid diameter as the denominator. CONTRAST:  53mL OMNIPAQUE IOHEXOL 350 MG/ML SOLN COMPARISON:  None. FINDINGS: CT  HEAD Brain: No acute intracranial hemorrhage, mass effect, or edema. Gray-white differentiation is preserved. Patchy hypoattenuation in the supratentorial white matter is nonspecific but probably reflects chronic microvascular ischemic changes. Prominence of the ventricles and sulci reflects generalized parenchymal volume loss. No extra-axial collection. Vascular: No hyperdense vessel. Skull: Unremarkable. Sinuses/Orbits: No acute abnormality. Other: Mastoid air cells are clear. ASPECTS [facility] Stroke Program Early CT Score) - Ganglionic level infarction (caudate, lentiform nuclei, internal capsule, insula, M1-M3 cortex): 7 - Supraganglionic infarction (M4-M6 cortex): 3 Total score (0-10 with 10 being normal): 10 CTA NECK  Aortic arch: Great vessel origins are patent. There is mixed plaque along the innominate extending into the subclavian origin with less than 50% stenosis. Right carotid system: Patent. Atherosclerotic wall thickening with eccentric plaque along the proximal common carotid causing less than 50% stenosis. Minimal calcified plaque at the bifurcation. No stenosis at the ICA origin. Left carotid system: Patent. Mild calcified plaque at the ICA origin without stenosis. Vertebral arteries:Left vertebral artery is patent. Right V1 vertebral artery is not opacified. There is faint enhancement of the right V2 and V3 segments. Skeleton: Degenerative changes of the cervical spine. Other neck: Scarring at the right lung apex with traction bronchiectasis. CTA HEAD Anterior circulation: Intracranial internal carotid arteries are patent with minimal calcified plaque. Anterior and middle cerebral arteries are patent. Posterior circulation: Intracranial vertebral arteries are patent. Improved opacification of the right vertebral artery beyond the PICA origin probably related to retrograde flow. Basilar artery is patent. Posterior cerebral arteries are patent. Bilateral posterior communicating arteries are present,  right larger than left. Venous sinuses: Not well evaluated. IMPRESSION: There is no acute intracranial hemorrhage or evidence of acute infarction. ASPECT score is 10. 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 Patent intracranially with greater flow beyond PICA origin likely from retrograde flow. Otherwise, no hemodynamically significant stenosis. Initial results were communicated to Dr. Cheral Marker at 2:39 p.m. on 01/11/2021 by text page via the [facility] messaging system. Electronically Signed   By: Macy Mis M.D.   On: 01/11/2021 14:50   MR BRAIN W WO CONTRAST  Addendum Date: 01/11/2021   ADDENDUM REPORT: 01/11/2021 18:58 ADDENDUM: There is a wedge-shaped area of diffusion hyperintensity at the left paramedian pons without corresponding T2 hyperintensity suspicious for acute perforator infarct. Updated findings were discussed with Dr. Cheral Marker on 01/11/2021 at 6:50 p.m. Punctate focus of diffusion hyperintensity in the posterior left cerebellum is without correlate on coronal DWI. Electronically Signed   By: Macy Mis M.D.   On: 01/11/2021 18:58   Result Date: 01/11/2021 CLINICAL DATA:  Code stroke follow-up, history of multiple sclerosis EXAM: MRI HEAD WITHOUT AND WITH CONTRAST TECHNIQUE: Multiplanar, multiecho pulse sequences of the brain and surrounding structures were obtained without and with intravenous contrast. CONTRAST:  35mL GADAVIST GADOBUTROL 1 MMOL/ML IV SOLN COMPARISON:  12/14/2019 FINDINGS: Brain: There is no acute infarction or intracranial hemorrhage. There is no intracranial mass, mass effect, or edema. There is no hydrocephalus or extra-axial fluid collection. Foci of T2 hyperintensity are present in the periventricular greater than subcortical white matter consistent with history of multiple sclerosis. A few small foci are also present within the cerebellum. No change since the prior  study. Prominence of the ventricles and sulci reflects stable parenchymal volume loss. No abnormal enhancement. Vascular: Major vessel flow voids at the skull base are preserved. Skull and upper cervical spine: Normal marrow signal is preserved. Sinuses/Orbits: Minor mucosal thickening.  Orbits are unremarkable. Other: Sella is unremarkable.  Mastoid air cells are clear. IMPRESSION: No acute infarction. Stable burden of chronic demyelinating disease. No evidence of active demyelination. Stable parenchymal volume loss. Electronically Signed: By: Macy Mis M.D. On: 01/11/2021 17:16   MR CERVICAL SPINE WO CONTRAST  Result Date: 01/12/2021 CLINICAL DATA:  Acute or progressive myelopathy EXAM: MRI CERVICAL SPINE WITHOUT CONTRAST TECHNIQUE: Multiplanar, multisequence MR imaging of the cervical spine was performed. No intravenous contrast was administered. COMPARISON:  02/18/2017 FINDINGS: Alignment: Exaggerated cervical lordosis. Vertebrae: No fracture, evidence of discitis, or  bone lesion. Cord: Normal signal. Posterior Fossa, vertebral arteries, paraspinal tissues: Intracranial findings described on brain MRI from yesterday. No perispinal mass or inflammation noted Disc levels: C2-3: Mild facet spurring. C3-4: Asymmetric left facet spurring with ankylosis C4-5: Ligamentum flavum thickening. Degenerative facet spurring. No neural compression C5-6: Disc narrowing and bulging. Small central disc protrusion. Mild facet spurring with ligamentum flavum thickening. Degenerative interspinous ganglion formation. C6-7: Narrowing of the disc with mild bulging. Borderline facet spurring. C7-T1:Degenerative facet spurring on the left more than right. No herniation or impingement. IMPRESSION: 1. No cord signal abnormality or impingement. 2. Noncompressive degenerative changes are described above. Electronically Signed   By: Monte Fantasia M.D.   On: 01/12/2021 07:30   CT HEAD CODE STROKE WO CONTRAST  Result Date:  01/11/2021 CLINICAL DATA:  Code stroke EXAM: CT HEAD WITHOUT CONTRAST CT ANGIOGRAPHY OF THE HEAD AND NECK TECHNIQUE: Contiguous axial images were obtained from the base of the skull through the vertex without intravenous contrast. Multidetector CT imaging of the head and neck was performed using the standard protocol during bolus administration of intravenous contrast. Multiplanar CT image reconstructions and MIPs were obtained to evaluate the vascular anatomy. Carotid stenosis measurements (when applicable) are obtained utilizing NASCET criteria, using the distal internal carotid diameter as the denominator. CONTRAST:  51mL OMNIPAQUE IOHEXOL 350 MG/ML SOLN COMPARISON:  None. FINDINGS: CT HEAD Brain: No acute intracranial hemorrhage, mass effect, or edema. Gray-white differentiation is preserved. Patchy hypoattenuation in the supratentorial white matter is nonspecific but probably reflects chronic microvascular ischemic changes. Prominence of the ventricles and sulci reflects generalized parenchymal volume loss. No extra-axial collection. Vascular: No hyperdense vessel. Skull: Unremarkable. Sinuses/Orbits: No acute abnormality. Other: Mastoid air cells are clear. ASPECTS ([place] Stroke Program Early CT Score) - Ganglionic level infarction (caudate, lentiform nuclei, internal capsule, insula, M1-M3 cortex): 7 - Supraganglionic infarction (M4-M6 cortex): 3 Total score (0-10 with 10 being normal): 10 CTA NECK Aortic arch: Great vessel origins are patent. There is mixed plaque along the innominate extending into the subclavian origin with less than 50% stenosis. Right carotid system: Patent. Atherosclerotic wall thickening with eccentric plaque along the proximal common carotid causing less than 50% stenosis. Minimal calcified plaque at the bifurcation. No stenosis at the ICA origin. Left carotid system: Patent. Mild calcified plaque at the ICA origin without stenosis. Vertebral arteries:Left vertebral artery is  patent. Right V1 vertebral artery is not opacified. There is faint enhancement of the right V2 and V3 segments. Skeleton: Degenerative changes of the cervical spine. Other neck: Scarring at the right lung apex with traction bronchiectasis. CTA HEAD Anterior circulation: Intracranial internal carotid arteries are patent with minimal calcified plaque. Anterior and middle cerebral arteries are patent. Posterior circulation: Intracranial vertebral arteries are patent. Improved opacification of the right vertebral artery beyond the PICA origin probably related to retrograde flow. Basilar artery is patent. Posterior cerebral arteries are patent. Bilateral posterior communicating arteries are present, right larger than left. Venous sinuses: Not well evaluated. IMPRESSION: There is no acute intracranial hemorrhage or evidence of acute infarction. ASPECT score is 10. 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 Patent intracranially with greater flow beyond PICA origin likely from retrograde flow. Otherwise, no hemodynamically significant stenosis. Initial results were communicated to Dr. Cheral Marker at 2:39 p.m. on 01/11/2021 by text page via the [facility] messaging system. Electronically Signed   By: Macy Mis M.D.   On: 01/11/2021 14:50    ***  Lavon Paganini Angiulli, PA-C 01/12/2021

## 2021-01-12 NOTE — Progress Notes (Addendum)
Neurology Progress Note  Patient ID: [name] [name] is a 70 y.o. with PMHx of  has a past medical history of Anxiety, Arthritis, Breast cancer ([place]) (1999), CHF (congestive heart failure) ([place]), COPD (chronic obstructive pulmonary disease) ([place]), Ejection fraction, Herpes, Hypothyroidism, IBS (irritable bowel syndrome), Mitral valve regurgitation, Multiple sclerosis ([place]), Neurogenic bladder, Osteoporosis, Ovarian cyst, Paroxysmal atrial fibrillation ([place]), Pulmonary hypertension ([place]), S/P Maze operation for atrial fibrillation (01/26/2012), S/P mitral valve replacement (01/26/2012), and Warfarin anticoagulation.  Initially consulted for: right sided weakness and acute nonverbal state.    Major interval events:  NIHSS 20 on admission   Subjective: Reports feeling much improved today.   Exam: Vitals:   01/12/21 0317 01/12/21 0906  BP: (!) 147/61 (!) 117/97  Pulse: 86 99  Resp: 18   Temp: 98.5 F (36.9 C) (!) 97.5 F (36.4 C)  SpO2: 96% 97%   Gen: In bed, comfortable  Resp: non-labored breathing, no grossly audible wheezing Cardiac: Perfusing extremities well  Abd: soft, nt  Neurological Examination Mental Status: awake and alert and follows all commands without difficulty, including  protruding tongue , touching her nose to command . Answers all questions without difficulty. Able to give a clear and coherent history.    Cranial Nerves: II: pupils are about 67mm and briskly reactive.   III,IV, VI: At rest left eye is at midline, right eye has minimal extropia. But otherwise intact. No nystagmus. Eyes with fully intact visual fields. V,VII: F  Smile is symmetric without any facial drooping noted. VIII: Hearing intact to voice IX,X:  intact.  XI: Head is midline XII: Protrudes tongue without deviation.   Motor: Right :  Upper extremity   4+/5                            Left:     Upper extremity   5/5             Lower extremity   4+/5                                         Lower  extremity   4+/5 Bulk normal throughout Sensory: Intact to light touch throughout at time of this exam. Deep Tendon Reflexes: 2+ and symmetric biceps, 3+ and symmetric patellae Plantars: Right: upgoing                                Left: upgoing Cerebellar: FTN intact Gait: Unable to assess   Pertinent Labs:  Results for [name] [name] (MRN [id number]) as of 01/12/2021 13:10  Ref. Range 01/12/2021 02:07  Total CHOL/HDL Ratio Latest Units: RATIO 3.3  Cholesterol Latest Ref Range: 0 - 200 mg/dL 204 (H)  HDL Cholesterol Latest Ref Range: >40 mg/dL 62  LDL (calc) Latest Ref Range: 0 - 99 mg/dL 131 (H)  Triglycerides Latest Ref Range: <150 mg/dL 53  VLDL Latest Ref Range: 0 - 40 mg/dL 11  Results for [name] [name] (MRN [id number]) as of 01/12/2021 13:10  Ref. Range 01/12/2021 02:07  Hemoglobin A1C Latest Ref Range: 4.8 - 5.6 % 5.5   MRI  Brain without contrast 01/11/21 There is a wedge-shaped area of diffusion hyperintensity at the left paramedian pons without corresponding T2 hyperintensity suspicious for acute perforator infarct.   Punctate focus of  diffusion hyperintensity in the posterior left cerebellum is without correlate on coronal DWI.  Echocardiogram 04/2020 Left ventricular ejection fraction, by estimation, is 65 to 70%.  Left Atrium: Left atrial size was normal in size.  IAS/Shunts: No atrial level shunt detected by color flow Doppler.   Impression:  70 year old female with history outlined above who presented with right sided weakness as well as acute nonverbal state. 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   Recommendations: add aspirin 81mg  daily x 2 weeks then discontinue  Continue with coumadin as prehospital/ in-hospital management per primary team. Consider statin therapy to get LDL to goal <70 PT/OT/ST Placement No need for additional echocardiogram/wouldn't change management.   Maintain normotension. Tight  Glycemic control.  We  will continue to follow.  Above discussed with Dr. Merlene Laughter.   Dr. Merlene Laughter -- The patient was seen and examined by me; notes, chart and tests reviewed and discussed with the practice provider, other providers, patient, and family.  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  There is also acute infarct in the inferior portion of the cerebellum on the left side.

## 2021-01-13 ENCOUNTER — Encounter (HOSPITAL_COMMUNITY): Payer: Self-pay | Admitting: Physical Medicine & Rehabilitation

## 2021-01-13 ENCOUNTER — Inpatient Hospital Stay (HOSPITAL_COMMUNITY)
Admission: RE | Admit: 2021-01-13 | Discharge: 2021-01-30 | DRG: 057 | Disposition: A | Discharge status: Home Health | Payer: Medicare Other | Source: Intra-hospital | Attending: Physical Medicine & Rehabilitation | Admitting: Physical Medicine & Rehabilitation

## 2021-01-13 ENCOUNTER — Other Ambulatory Visit: Payer: Self-pay

## 2021-01-13 DIAGNOSIS — I69320 Aphasia following cerebral infarction: Secondary | ICD-10-CM

## 2021-01-13 DIAGNOSIS — R799 Abnormal finding of blood chemistry, unspecified: Secondary | ICD-10-CM

## 2021-01-13 DIAGNOSIS — G35 Multiple sclerosis: Secondary | ICD-10-CM

## 2021-01-13 DIAGNOSIS — N319 Neuromuscular dysfunction of bladder, unspecified: Secondary | ICD-10-CM | POA: Diagnosis present

## 2021-01-13 DIAGNOSIS — E039 Hypothyroidism, unspecified: Secondary | ICD-10-CM | POA: Diagnosis present

## 2021-01-13 DIAGNOSIS — E785 Hyperlipidemia, unspecified: Secondary | ICD-10-CM | POA: Diagnosis present

## 2021-01-13 DIAGNOSIS — J439 Emphysema, unspecified: Secondary | ICD-10-CM | POA: Diagnosis present

## 2021-01-13 DIAGNOSIS — K5901 Slow transit constipation: Secondary | ICD-10-CM

## 2021-01-13 DIAGNOSIS — Z7901 Long term (current) use of anticoagulants: Secondary | ICD-10-CM

## 2021-01-13 DIAGNOSIS — I480 Paroxysmal atrial fibrillation: Secondary | ICD-10-CM

## 2021-01-13 DIAGNOSIS — I272 Pulmonary hypertension, unspecified: Secondary | ICD-10-CM | POA: Diagnosis present

## 2021-01-13 DIAGNOSIS — N390 Urinary tract infection, site not specified: Secondary | ICD-10-CM

## 2021-01-13 DIAGNOSIS — I69351 Hemiplegia and hemiparesis following cerebral infarction affecting right dominant side: Principal | ICD-10-CM

## 2021-01-13 DIAGNOSIS — Z7982 Long term (current) use of aspirin: Secondary | ICD-10-CM | POA: Diagnosis not present

## 2021-01-13 DIAGNOSIS — Z952 Presence of prosthetic heart valve: Secondary | ICD-10-CM

## 2021-01-13 DIAGNOSIS — R42 Dizziness and giddiness: Secondary | ICD-10-CM

## 2021-01-13 DIAGNOSIS — I639 Cerebral infarction, unspecified: Secondary | ICD-10-CM | POA: Diagnosis present

## 2021-01-13 DIAGNOSIS — I493 Ventricular premature depolarization: Secondary | ICD-10-CM

## 2021-01-13 DIAGNOSIS — I5032 Chronic diastolic (congestive) heart failure: Secondary | ICD-10-CM | POA: Diagnosis present

## 2021-01-13 DIAGNOSIS — I10 Essential (primary) hypertension: Secondary | ICD-10-CM | POA: Diagnosis not present

## 2021-01-13 DIAGNOSIS — I110 Hypertensive heart disease with heart failure: Secondary | ICD-10-CM | POA: Diagnosis present

## 2021-01-13 DIAGNOSIS — I6389 Other cerebral infarction: Secondary | ICD-10-CM

## 2021-01-13 DIAGNOSIS — N3289 Other specified disorders of bladder: Secondary | ICD-10-CM | POA: Diagnosis present

## 2021-01-13 DIAGNOSIS — M436 Torticollis: Secondary | ICD-10-CM

## 2021-01-13 DIAGNOSIS — M810 Age-related osteoporosis without current pathological fracture: Secondary | ICD-10-CM | POA: Diagnosis present

## 2021-01-13 DIAGNOSIS — Z853 Personal history of malignant neoplasm of breast: Secondary | ICD-10-CM

## 2021-01-13 DIAGNOSIS — R9431 Abnormal electrocardiogram [ECG] [EKG]: Secondary | ICD-10-CM | POA: Diagnosis not present

## 2021-01-13 DIAGNOSIS — J449 Chronic obstructive pulmonary disease, unspecified: Secondary | ICD-10-CM | POA: Diagnosis not present

## 2021-01-13 LAB — CBC
HCT: 36.8 % (ref 36.0–46.0)
Hemoglobin: 12.6 g/dL (ref 12.0–15.0)
MCH: 32.1 pg (ref 26.0–34.0)
MCHC: 34.2 g/dL (ref 30.0–36.0)
MCV: 93.6 fL (ref 80.0–100.0)
Platelets: 211 10*3/uL (ref 150–400)
RBC: 3.93 MIL/uL (ref 3.87–5.11)
RDW: 13.5 % (ref 11.5–15.5)
WBC: 6 10*3/uL (ref 4.0–10.5)
nRBC: 0 % (ref 0.0–0.2)

## 2021-01-13 LAB — PROTIME-INR
INR: 3.2 — ABNORMAL HIGH (ref 0.8–1.2)
Prothrombin Time: 31.6 seconds — ABNORMAL HIGH (ref 11.4–15.2)

## 2021-01-13 MED ORDER — ATORVASTATIN CALCIUM 80 MG PO TABS
80.0000 mg | ORAL_TABLET | Freq: Every day | ORAL | Status: DC
  Administered 2021-01-14 – 2021-01-30 (×17): 80 mg via ORAL
  Filled 2021-01-13 (×17): qty 1

## 2021-01-13 MED ORDER — ASPIRIN 81 MG PO TBEC: 81.0000 mg | DELAYED_RELEASE_TABLET | Freq: Every day | ORAL | 11 refills | Status: DC

## 2021-01-13 MED ORDER — SODIUM CHLORIDE 0.9 % IV SOLN
1.0000 g | INTRAVENOUS | Status: DC
  Administered 2021-01-13: 1 g via INTRAVENOUS
  Filled 2021-01-13: qty 10

## 2021-01-13 MED ORDER — THYROID 60 MG PO TABS
90.0000 mg | ORAL_TABLET | Freq: Every day | ORAL | Status: DC
  Administered 2021-01-14 – 2021-01-30 (×17): 90 mg via ORAL
  Filled 2021-01-13 (×18): qty 1

## 2021-01-13 MED ORDER — ACETAMINOPHEN 650 MG RE SUPP: 650.0000 mg | RECTAL | Status: DC | PRN

## 2021-01-13 MED ORDER — AMOXICILLIN-POT CLAVULANATE 875-125 MG PO TABS: 1.0000 | ORAL_TABLET | Freq: Two times a day (BID) | ORAL | 0 refills | Status: DC

## 2021-01-13 MED ORDER — CALCIUM CARBONATE 1250 (500 CA) MG PO TABS
1.0000 | ORAL_TABLET | Freq: Every day | ORAL | Status: DC
  Administered 2021-01-14 – 2021-01-30 (×17): 500 mg via ORAL
  Filled 2021-01-13 (×18): qty 1

## 2021-01-13 MED ORDER — ACETAMINOPHEN 160 MG/5ML PO SOLN: 650.0000 mg | ORAL | Status: DC | PRN

## 2021-01-13 MED ORDER — ATORVASTATIN CALCIUM 80 MG PO TABS: 80.0000 mg | ORAL_TABLET | Freq: Every day | ORAL | Status: DC

## 2021-01-13 MED ORDER — MECLIZINE HCL 25 MG PO TABS
12.5000 mg | ORAL_TABLET | Freq: Three times a day (TID) | ORAL | Status: AC
  Administered 2021-01-13 – 2021-01-14 (×4): 12.5 mg via ORAL
  Filled 2021-01-13 (×4): qty 1

## 2021-01-13 MED ORDER — VITAMIN D 25 MCG (1000 UNIT) PO TABS
6000.0000 [IU] | ORAL_TABLET | Freq: Every day | ORAL | Status: DC
  Administered 2021-01-14 – 2021-01-30 (×17): 6000 [IU] via ORAL
  Filled 2021-01-13 (×19): qty 6

## 2021-01-13 MED ORDER — WARFARIN SODIUM 5 MG PO TABS: 5.0000 mg | ORAL_TABLET | Freq: Once | ORAL | Status: DC

## 2021-01-13 MED ORDER — SENNOSIDES-DOCUSATE SODIUM 8.6-50 MG PO TABS: 1.0000 | ORAL_TABLET | Freq: Every evening | ORAL | Status: DC | PRN

## 2021-01-13 MED ORDER — METOPROLOL TARTRATE 25 MG PO TABS
25.0000 mg | ORAL_TABLET | Freq: Two times a day (BID) | ORAL | Status: DC
  Administered 2021-01-13 – 2021-01-30 (×32): 25 mg via ORAL
  Filled 2021-01-13 (×33): qty 1

## 2021-01-13 MED ORDER — WARFARIN - PHARMACIST DOSING INPATIENT: Freq: Every day | Status: DC

## 2021-01-13 MED ORDER — ACETAMINOPHEN 325 MG PO TABS
650.0000 mg | ORAL_TABLET | ORAL | Status: DC | PRN
Start: 2021-01-13 — End: 2021-01-30
  Administered 2021-01-14 – 2021-01-23 (×6): 650 mg via ORAL
  Filled 2021-01-13 (×8): qty 2

## 2021-01-13 MED ORDER — WARFARIN SODIUM 5 MG PO TABS
5.0000 mg | ORAL_TABLET | Freq: Once | ORAL | Status: DC
  Administered 2021-01-13: 5 mg via ORAL
  Filled 2021-01-13: qty 1

## 2021-01-13 MED ORDER — SODIUM CHLORIDE 0.9 % IV SOLN
1.0000 g | INTRAVENOUS | Status: DC
  Filled 2021-01-13: qty 10

## 2021-01-13 NOTE — Discharge Summary (Addendum)
Physician Discharge Summary  [name] [name] JQZ:[id number] DOB: 1951-05-19 DOA: 01/11/2021  PCP: Ma Hillock, DO  Admit date: 01/11/2021 Discharge date: 01/13/2021  Admitted From: Home Disposition:  CIR  Recommendations for Outpatient Follow-up:  1. Follow up with PCP in 1-2 weeks 2. Please obtain BMP/CBC in one week 3. Follow-up urine culture de-escalate antibiotic regimen when results are back.  Home Health:No Equipment/Devices:None  Discharge Condition:Stable CODE STATUS:Full Diet recommendation: Heart Healthy   Brief/Interim Summary:  70 y.o. female past medical history significant for mitral regurgitation status post metallic mitral valve replacement, paroxysmal atrial fibrillation, essential hypertension COPD remote history of MS comes in for aphasia right-sided weakness and feeling dizzy.  Significant studies: 01/11/2021 CT of the head showed no acute hemorrhage. 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 01/11/2021 MRI of the brain showed a wedge-shaped area of diffusion on the left paramedian pons suspicious for acute infarct, some chronic myelinating disease Antibiotics: Augmentin  Microbiology data: Urine culture negative follow-up as an outpatient.  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 She was started on Lipitor. MRI of the brain as above showed pontine stroke. CT angio of the head and neck showed no high-grade occlusion. 2D echo previously was unremarkable. Coumadin was therapeutic. Physical therapy evaluated the patient recommended skilled nursing facility. Antihypertensive medications  were held she was allowed permissive hypertension. She had no events on telemetry.  History of mitral valve regurgitation on Coumadin: INR mildly subtherapeutic goal 2.5-3.5. Continue titrate per pharmacy.  Essential hypertension: Metoprolol was continued, lisinopril was held on admission to allow permissive hypertension. Could be resumed as an outpatient.  Frequent PVCs: Continue metoprolol.  Hypothyroidism: Continue Synthroid.  Urinary tract infection: UA showed multiple bacteria and white blood cells, there was a musty smell in her room probably her urine she also noticed it, her urine has significantly change in color. Urine cultures were sent she was started on IV Rocephin empirically changed to oral Augmentin as an outpatient which should continue for 3 additional days. Follow-up in urine cultures as an outpatient.  COPD: Stable. Discharge Instructions  Discharge Instructions    Diet - low sodium heart healthy   Complete by: As directed    Increase activity slowly   Complete by: As directed      Allergies as of 01/13/2021      Reactions   Erythromycin Nausea And Vomiting   Valsartan Other (See Comments)   Pt reports caused her depression      Medication List    STOP taking these medications   amoxicillin 500 MG capsule Commonly known as: AMOXIL     TAKE these medications   amoxicillin-clavulanate 875-125 MG tablet Commonly known as: Augmentin Take 1 tablet by mouth 2 (two) times daily for 3 days.   Armour Thyroid 90 MG tablet Generic drug: thyroid Take 90 mg by mouth daily.   atorvastatin 80 MG tablet Commonly known as: LIPITOR Take 1 tablet (80 mg total) by mouth daily.   Biotin 5000 MCG Caps Take 10,000 mcg by mouth in the morning and at bedtime.   Black Cohosh 40 MG Caps Take 40 mg by mouth every evening.  BROMELAIN PO Take 1 capsule by mouth 2 (two) times daily.   calcium carbonate 1500 (600 Ca) MG Tabs tablet Commonly known as:  OSCAL Take 1,500 mg by mouth daily. Takes 1 time daily   Co Q-10 100 MG Caps Take 1 capsule by mouth daily.   Cranberry 500 MG Caps Take 1 capsule by mouth daily.   GLUCOSAMINE CHOND COMPLEX/MSM PO Take 1 tablet by mouth in the morning and at bedtime. Strength of dose Glucosamine 1500mg  /Chodroitron 1000mg / MSM 500mg  takes one twice daily   L-THEANINE PO Take 1 capsule by mouth daily.   lactose free nutrition Liqd Take 237 mLs by mouth daily.   lidocaine-prilocaine cream Commonly known as: EMLA Apply 1 application topically as needed (per pt this is for use before blood draws).   lisinopril 5 MG tablet Commonly known as: ZESTRIL Take 1 tablet (5 mg total) by mouth daily.   Lysine 500 MG Caps Take 1 capsule by mouth daily.   Magnesium Citrate 200 MG Tabs Take 200 mg by mouth daily.   metoprolol tartrate 25 MG tablet Commonly known as: LOPRESSOR TAKE 1 TAB (25 MG) PO TWICE DAILY - pt must make appt with provider for further refills - 1st attempt What changed:   how much to take  how to take this  when to take this  additional instructions   MILK THISTLE PO Take 1 capsule by mouth daily.   Olive Leaf 500 MG Caps Take 1 capsule by mouth 2 (two) times daily.   PETADOLEX PO Take 1 tablet by mouth 2 (two) times daily.   PROBIOTIC DAILY PO Take 1 capsule by mouth daily. Take 1 capsule once a day   PROGESTERONE MICRONIZED PO Take 100 mg by mouth daily.   Pumpkin Seed 500 MG Tabs Take 1 tablet by mouth 2 (two) times daily.   TURMERIC PO Take 1 capsule by mouth daily.   vitamin A 10000 UNIT capsule Take 10,000 Units by mouth daily.   vitamin B-12 1000 MCG tablet Commonly known as: CYANOCOBALAMIN Take 1,000 mcg by mouth daily. Taking sublingal   Vitamin D3 50 MCG (2000 UT) capsule Take 4,000 Units by mouth daily.   vitamin E 180 MG (400 UNITS) capsule Take 400 Units by mouth daily.   warfarin 5 MG tablet Commonly known as: COUMADIN Take as  directed. If you are unsure how to take this medication, talk to your nurse or doctor. Original instructions: TAKE 1 TABLET DAILY EXCEPT 1.5 TABLETS ON MONDAY AND THURSDAY  BY MOUTH EVERY DAY OR AS DIRECTED BY COUMADIN CLINIC What changed:   how much to take  how to take this  when to take this  additional instructions       Allergies  Allergen Reactions  . Erythromycin Nausea And Vomiting  . Valsartan Other (See Comments)    Pt reports caused her depression    Consultations:  Neurology   Procedures/Studies: CT ANGIO HEAD W OR WO CONTRAST  Result Date: 01/11/2021 CLINICAL DATA:  Code stroke EXAM: CT HEAD WITHOUT CONTRAST CT ANGIOGRAPHY OF THE HEAD AND NECK TECHNIQUE: Contiguous axial images were obtained from the base of the skull through the vertex without intravenous contrast. Multidetector CT imaging of the head and neck was performed using the standard protocol during bolus administration of intravenous contrast. Multiplanar CT image reconstructions and MIPs were obtained to evaluate the vascular anatomy. Carotid stenosis measurements (when applicable) are obtained utilizing NASCET criteria, using the distal internal carotid diameter as the denominator.  CONTRAST:  33mL OMNIPAQUE IOHEXOL 350 MG/ML SOLN COMPARISON:  None. FINDINGS: CT HEAD Brain: No acute intracranial hemorrhage, mass effect, or edema. Gray-white differentiation is preserved. Patchy hypoattenuation in the supratentorial white matter is nonspecific but probably reflects chronic microvascular ischemic changes. Prominence of the ventricles and sulci reflects generalized parenchymal volume loss. No extra-axial collection. Vascular: No hyperdense vessel. Skull: Unremarkable. Sinuses/Orbits: No acute abnormality. Other: Mastoid air cells are clear. ASPECTS ([place] Stroke Program Early CT Score) - Ganglionic level infarction (caudate, lentiform nuclei, internal capsule, insula, M1-M3 cortex): 7 - Supraganglionic infarction  (M4-M6 cortex): 3 Total score (0-10 with 10 being normal): 10 CTA NECK Aortic arch: Great vessel origins are patent. There is mixed plaque along the innominate extending into the subclavian origin with less than 50% stenosis. Right carotid system: Patent. Atherosclerotic wall thickening with eccentric plaque along the proximal common carotid causing less than 50% stenosis. Minimal calcified plaque at the bifurcation. No stenosis at the ICA origin. Left carotid system: Patent. Mild calcified plaque at the ICA origin without stenosis. Vertebral arteries:Left vertebral artery is patent. Right V1 vertebral artery is not opacified. There is faint enhancement of the right V2 and V3 segments. Skeleton: Degenerative changes of the cervical spine. Other neck: Scarring at the right lung apex with traction bronchiectasis. CTA HEAD Anterior circulation: Intracranial internal carotid arteries are patent with minimal calcified plaque. Anterior and middle cerebral arteries are patent. Posterior circulation: Intracranial vertebral arteries are patent. Improved opacification of the right vertebral artery beyond the PICA origin probably related to retrograde flow. Basilar artery is patent. Posterior cerebral arteries are patent. Bilateral posterior communicating arteries are present, right larger than left. Venous sinuses: Not well evaluated. IMPRESSION: There is no acute intracranial hemorrhage or evidence of acute infarction. ASPECT score is 10. 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 Patent intracranially with greater flow beyond PICA origin likely from retrograde flow. Otherwise, no hemodynamically significant stenosis. Initial results were communicated to Dr. Cheral Marker at 2:39 p.m. on 01/11/2021 by text page via the [facility] messaging system. Electronically Signed   By: Macy Mis M.D.   On: 01/11/2021 14:50   CT ANGIO  NECK W OR WO CONTRAST  Result Date: 01/11/2021 CLINICAL DATA:  Code stroke EXAM: CT HEAD WITHOUT CONTRAST CT ANGIOGRAPHY OF THE HEAD AND NECK TECHNIQUE: Contiguous axial images were obtained from the base of the skull through the vertex without intravenous contrast. Multidetector CT imaging of the head and neck was performed using the standard protocol during bolus administration of intravenous contrast. Multiplanar CT image reconstructions and MIPs were obtained to evaluate the vascular anatomy. Carotid stenosis measurements (when applicable) are obtained utilizing NASCET criteria, using the distal internal carotid diameter as the denominator. CONTRAST:  34mL OMNIPAQUE IOHEXOL 350 MG/ML SOLN COMPARISON:  None. FINDINGS: CT HEAD Brain: No acute intracranial hemorrhage, mass effect, or edema. Gray-white differentiation is preserved. Patchy hypoattenuation in the supratentorial white matter is nonspecific but probably reflects chronic microvascular ischemic changes. Prominence of the ventricles and sulci reflects generalized parenchymal volume loss. No extra-axial collection. Vascular: No hyperdense vessel. Skull: Unremarkable. Sinuses/Orbits: No acute abnormality. Other: Mastoid air cells are clear. ASPECTS ([place] Stroke Program Early CT Score) - Ganglionic level infarction (caudate, lentiform nuclei, internal capsule, insula, M1-M3 cortex): 7 - Supraganglionic infarction (M4-M6 cortex): 3 Total score (0-10 with 10 being normal): 10 CTA NECK Aortic arch: Great vessel origins are patent. There is mixed plaque along the innominate extending into  the subclavian origin with less than 50% stenosis. Right carotid system: Patent. Atherosclerotic wall thickening with eccentric plaque along the proximal common carotid causing less than 50% stenosis. Minimal calcified plaque at the bifurcation. No stenosis at the ICA origin. Left carotid system: Patent. Mild calcified plaque at the ICA origin without stenosis. Vertebral  arteries:Left vertebral artery is patent. Right V1 vertebral artery is not opacified. There is faint enhancement of the right V2 and V3 segments. Skeleton: Degenerative changes of the cervical spine. Other neck: Scarring at the right lung apex with traction bronchiectasis. CTA HEAD Anterior circulation: Intracranial internal carotid arteries are patent with minimal calcified plaque. Anterior and middle cerebral arteries are patent. Posterior circulation: Intracranial vertebral arteries are patent. Improved opacification of the right vertebral artery beyond the PICA origin probably related to retrograde flow. Basilar artery is patent. Posterior cerebral arteries are patent. Bilateral posterior communicating arteries are present, right larger than left. Venous sinuses: Not well evaluated. IMPRESSION: There is no acute intracranial hemorrhage or evidence of acute infarction. ASPECT score is 10. 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 Patent intracranially with greater flow beyond PICA origin likely from retrograde flow. Otherwise, no hemodynamically significant stenosis. Initial results were communicated to Dr. Cheral Marker at 2:39 p.m. on 01/11/2021 by text page via the [facility] messaging system. Electronically Signed   By: Macy Mis M.D.   On: 01/11/2021 14:50   MR BRAIN W WO CONTRAST  Addendum Date: 01/11/2021   ADDENDUM REPORT: 01/11/2021 18:58 ADDENDUM: There is a wedge-shaped area of diffusion hyperintensity at the left paramedian pons without corresponding T2 hyperintensity suspicious for acute perforator infarct. Updated findings were discussed with Dr. Cheral Marker on 01/11/2021 at 6:50 p.m. Punctate focus of diffusion hyperintensity in the posterior left cerebellum is without correlate on coronal DWI. Electronically Signed   By: Macy Mis M.D.   On: 01/11/2021 18:58   Result Date: 01/11/2021 CLINICAL  DATA:  Code stroke follow-up, history of multiple sclerosis EXAM: MRI HEAD WITHOUT AND WITH CONTRAST TECHNIQUE: Multiplanar, multiecho pulse sequences of the brain and surrounding structures were obtained without and with intravenous contrast. CONTRAST:  26mL GADAVIST GADOBUTROL 1 MMOL/ML IV SOLN COMPARISON:  12/14/2019 FINDINGS: Brain: There is no acute infarction or intracranial hemorrhage. There is no intracranial mass, mass effect, or edema. There is no hydrocephalus or extra-axial fluid collection. Foci of T2 hyperintensity are present in the periventricular greater than subcortical white matter consistent with history of multiple sclerosis. A few small foci are also present within the cerebellum. No change since the prior study. Prominence of the ventricles and sulci reflects stable parenchymal volume loss. No abnormal enhancement. Vascular: Major vessel flow voids at the skull base are preserved. Skull and upper cervical spine: Normal marrow signal is preserved. Sinuses/Orbits: Minor mucosal thickening.  Orbits are unremarkable. Other: Sella is unremarkable.  Mastoid air cells are clear. IMPRESSION: No acute infarction. Stable burden of chronic demyelinating disease. No evidence of active demyelination. Stable parenchymal volume loss. Electronically Signed: By: Macy Mis M.D. On: 01/11/2021 17:16   MR CERVICAL SPINE WO CONTRAST  Result Date: 01/12/2021 CLINICAL DATA:  Acute or progressive myelopathy EXAM: MRI CERVICAL SPINE WITHOUT CONTRAST TECHNIQUE: Multiplanar, multisequence MR imaging of the cervical spine was performed. No intravenous contrast was administered. COMPARISON:  02/18/2017 FINDINGS: Alignment: Exaggerated cervical lordosis. Vertebrae: No fracture, evidence of discitis, or bone lesion. Cord: Normal signal. Posterior Fossa, vertebral arteries, paraspinal tissues: Intracranial findings described on brain  MRI from yesterday. No perispinal mass or inflammation noted Disc levels: C2-3: Mild  facet spurring. C3-4: Asymmetric left facet spurring with ankylosis C4-5: Ligamentum flavum thickening. Degenerative facet spurring. No neural compression C5-6: Disc narrowing and bulging. Small central disc protrusion. Mild facet spurring with ligamentum flavum thickening. Degenerative interspinous ganglion formation. C6-7: Narrowing of the disc with mild bulging. Borderline facet spurring. C7-T1:Degenerative facet spurring on the left more than right. No herniation or impingement. IMPRESSION: 1. No cord signal abnormality or impingement. 2. Noncompressive degenerative changes are described above. Electronically Signed   By: Monte Fantasia M.D.   On: 01/12/2021 07:30   CT HEAD CODE STROKE WO CONTRAST  Result Date: 01/11/2021 CLINICAL DATA:  Code stroke EXAM: CT HEAD WITHOUT CONTRAST CT ANGIOGRAPHY OF THE HEAD AND NECK TECHNIQUE: Contiguous axial images were obtained from the base of the skull through the vertex without intravenous contrast. Multidetector CT imaging of the head and neck was performed using the standard protocol during bolus administration of intravenous contrast. Multiplanar CT image reconstructions and MIPs were obtained to evaluate the vascular anatomy. Carotid stenosis measurements (when applicable) are obtained utilizing NASCET criteria, using the distal internal carotid diameter as the denominator. CONTRAST:  12mL OMNIPAQUE IOHEXOL 350 MG/ML SOLN COMPARISON:  None. FINDINGS: CT HEAD Brain: No acute intracranial hemorrhage, mass effect, or edema. Gray-white differentiation is preserved. Patchy hypoattenuation in the supratentorial white matter is nonspecific but probably reflects chronic microvascular ischemic changes. Prominence of the ventricles and sulci reflects generalized parenchymal volume loss. No extra-axial collection. Vascular: No hyperdense vessel. Skull: Unremarkable. Sinuses/Orbits: No acute abnormality. Other: Mastoid air cells are clear. ASPECTS ([place] Stroke Program Early  CT Score) - Ganglionic level infarction (caudate, lentiform nuclei, internal capsule, insula, M1-M3 cortex): 7 - Supraganglionic infarction (M4-M6 cortex): 3 Total score (0-10 with 10 being normal): 10 CTA NECK Aortic arch: Great vessel origins are patent. There is mixed plaque along the innominate extending into the subclavian origin with less than 50% stenosis. Right carotid system: Patent. Atherosclerotic wall thickening with eccentric plaque along the proximal common carotid causing less than 50% stenosis. Minimal calcified plaque at the bifurcation. No stenosis at the ICA origin. Left carotid system: Patent. Mild calcified plaque at the ICA origin without stenosis. Vertebral arteries:Left vertebral artery is patent. Right V1 vertebral artery is not opacified. There is faint enhancement of the right V2 and V3 segments. Skeleton: Degenerative changes of the cervical spine. Other neck: Scarring at the right lung apex with traction bronchiectasis. CTA HEAD Anterior circulation: Intracranial internal carotid arteries are patent with minimal calcified plaque. Anterior and middle cerebral arteries are patent. Posterior circulation: Intracranial vertebral arteries are patent. Improved opacification of the right vertebral artery beyond the PICA origin probably related to retrograde flow. Basilar artery is patent. Posterior cerebral arteries are patent. Bilateral posterior communicating arteries are present, right larger than left. Venous sinuses: Not well evaluated. IMPRESSION: There is no acute intracranial hemorrhage or evidence of acute infarction. ASPECT score is 10. 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 Patent intracranially with greater flow beyond PICA origin likely from retrograde flow. Otherwise, no hemodynamically significant stenosis. Initial results were communicated to Dr. Cheral Marker at 2:39 p.m. on  01/11/2021 by text page via the [facility] messaging system. Electronically Signed   By: Macy Mis M.D.   On: 01/11/2021 14:50   (Echo, Carotid, EGD, Colonoscopy, ERCP)    Subjective: No complaints  Discharge Exam: Vitals:   01/13/21  0715 01/13/21 1116  BP: (!) 144/73 (!) 155/87  Pulse: 74 73  Resp: 18 18  Temp: 98 F (36.7 C) 97.8 F (36.6 C)  SpO2: 96% 99%   Vitals:   01/13/21 0002 01/13/21 0359 01/13/21 0715 01/13/21 1116  BP: (!) 146/74 (!) 152/78 (!) 144/73 (!) 155/87  Pulse: 75 80 74 73  Resp: 18 18 18 18   Temp: 97.9 F (36.6 C) 97.9 F (36.6 C) 98 F (36.7 C) 97.8 F (36.6 C)  TempSrc: Oral Oral Oral Oral  SpO2: 99% 97% 96% 99%  Weight:      Height:        General: Pt is alert, awake, not in acute distress Cardiovascular: RRR, S1/S2 +, no rubs, no gallops Respiratory: CTA bilaterally, no wheezing, no rhonchi Abdominal: Soft, NT, ND, bowel sounds + Extremities: no edema, no cyanosis    The results of significant diagnostics from this hospitalization (including imaging, microbiology, ancillary and laboratory) are listed below for reference.     Microbiology: Recent Results (from the past 240 hour(s))  Resp Panel by RT-PCR (Flu A&B, Covid) Nasopharyngeal Swab     Status: None   Collection Time: 01/11/21  3:21 PM   Specimen: Nasopharyngeal Swab; Nasopharyngeal(NP) swabs in vial transport medium  Result Value Ref Range Status   SARS Coronavirus 2 by RT PCR NEGATIVE NEGATIVE Final    Comment: (NOTE) SARS-CoV-2 target nucleic acids are NOT DETECTED.  The SARS-CoV-2 RNA is generally detectable in upper respiratory specimens during the acute phase of infection. The lowest concentration of SARS-CoV-2 viral copies this assay can detect is 138 copies/mL. A negative result does not preclude SARS-Cov-2 infection and should not be used as the sole basis for treatment or other patient management decisions. A negative result may occur with  improper specimen  collection/handling, submission of specimen other than nasopharyngeal swab, presence of viral mutation(s) within the areas targeted by this assay, and inadequate number of viral copies(<138 copies/mL). A negative result must be combined with clinical observations, patient history, and epidemiological information. The expected result is Negative.  Fact Sheet for Patients:  EntrepreneurPulse.com.au  Fact Sheet for Healthcare Providers:  IncredibleEmployment.be  This test is no t yet approved or cleared by the [place] FDA and  has been authorized for detection and/or diagnosis of SARS-CoV-2 by FDA under an Emergency Use Authorization (EUA). This EUA will remain  in effect (meaning this test can be used) for the duration of the COVID-19 declaration under Section 564(b)(1) of the Act, 21 U.S.C.section 360bbb-3(b)(1), unless the authorization is terminated  or revoked sooner.       Influenza A by PCR NEGATIVE NEGATIVE Final   Influenza B by PCR NEGATIVE NEGATIVE Final    Comment: (NOTE) The Xpert Xpress SARS-CoV-2/FLU/RSV plus assay is intended as an aid in the diagnosis of influenza from Nasopharyngeal swab specimens and should not be used as a sole basis for treatment. Nasal washings and aspirates are unacceptable for Xpert Xpress SARS-CoV-2/FLU/RSV testing.  Fact Sheet for Patients: EntrepreneurPulse.com.au  Fact Sheet for Healthcare Providers: IncredibleEmployment.be  This test is not yet approved or cleared by the [place] FDA and has been authorized for detection and/or diagnosis of SARS-CoV-2 by FDA under an Emergency Use Authorization (EUA). This EUA will remain in effect (meaning this test can be used) for the duration of the COVID-19 declaration under Section 564(b)(1) of the Act, 21 U.S.C. section 360bbb-3(b)(1), unless the authorization is terminated or revoked.  Performed at [facility],  [address], [place], [place] [address]      Labs: BNP (last 3 results) No results for input(s): BNP in the last 8760 hours. Basic Metabolic Panel: Recent Labs  Lab 01/11/21 1427 01/11/21 1440  NA 138 137  K 3.5 3.6  CL 102 101  CO2  --  25  GLUCOSE 143* 146*  BUN 27* 27*  CREATININE 0.70 0.78  CALCIUM  --  9.1   Liver Function Tests: Recent Labs  Lab 01/11/21 1440  AST 25  ALT 20  ALKPHOS 72  BILITOT 0.8  PROT 6.8  ALBUMIN 3.3*   No results for input(s): LIPASE, AMYLASE in the last 168 hours. No results for input(s): AMMONIA in the last 168 hours. CBC: Recent Labs  Lab 01/11/21 1427 01/11/21 1440 01/13/21 0502  WBC  --  6.5 6.0  NEUTROABS  --  3.5  --   HGB 13.9 14.3 12.6  HCT 41.0 42.9 36.8  MCV  --  95.3 93.6  PLT  --  263 211   Cardiac Enzymes: No results for input(s): CKTOTAL, CKMB, CKMBINDEX, TROPONINI in the last 168 hours. BNP: Invalid input(s): POCBNP CBG: Recent Labs  Lab 01/11/21 1421  GLUCAP 89   D-Dimer No results for input(s): DDIMER in the last 72 hours. Hgb A1c Recent Labs    01/12/21 0207  HGBA1C 5.5   Lipid Profile Recent Labs    01/12/21 0207  CHOL 204*  HDL 62  LDLCALC 131*  TRIG 53  CHOLHDL 3.3   Thyroid function studies Recent Labs    01/11/21 2036  TSH 0.371   Anemia work up No results for input(s): VITAMINB12, FOLATE, FERRITIN, TIBC, IRON, RETICCTPCT in the last 72 hours. Urinalysis    Component Value Date/Time   COLORURINE YELLOW 01/11/2021 1904   APPEARANCEUR HAZY (A) 01/11/2021 1904   LABSPEC 1.042 (H) 01/11/2021 1904   PHURINE 6.0 01/11/2021 1904   GLUCOSEU NEGATIVE 01/11/2021 1904   HGBUR SMALL (A) 01/11/2021 1904   BILIRUBINUR NEGATIVE 01/11/2021 1904   KETONESUR 20 (A) 01/11/2021 1904   PROTEINUR NEGATIVE 01/11/2021 1904   UROBILINOGEN 2.0 (H) 02/01/2012 1953   NITRITE POSITIVE (A) 01/11/2021 1904   LEUKOCYTESUR TRACE (A) 01/11/2021 1904   Sepsis Labs Invalid input(s):  PROCALCITONIN,  WBC,  LACTICIDVEN Microbiology Recent Results (from the past 240 hour(s))  Resp Panel by RT-PCR (Flu A&B, Covid) Nasopharyngeal Swab     Status: None   Collection Time: 01/11/21  3:21 PM   Specimen: Nasopharyngeal Swab; Nasopharyngeal(NP) swabs in vial transport medium  Result Value Ref Range Status   SARS Coronavirus 2 by RT PCR NEGATIVE NEGATIVE Final    Comment: (NOTE) SARS-CoV-2 target nucleic acids are NOT DETECTED.  The SARS-CoV-2 RNA is generally detectable in upper respiratory specimens during the acute phase of infection. The lowest concentration of SARS-CoV-2 viral copies this assay can detect is 138 copies/mL. A negative result does not preclude SARS-Cov-2 infection and should not be used as the sole basis for treatment or other patient management decisions. A negative result may occur with  improper specimen collection/handling, submission of specimen other than nasopharyngeal swab, presence of viral mutation(s) within the areas targeted by this assay, and inadequate number of viral copies(<138 copies/mL). A negative result must be combined with clinical observations, patient history, and epidemiological information. The expected result is Negative.  Fact Sheet for Patients:  EntrepreneurPulse.com.au  Fact Sheet for Healthcare Providers:  IncredibleEmployment.be  This test is no t yet approved or cleared by the  [place] Architectural technologist and  has been authorized for detection and/or diagnosis of SARS-CoV-2 by FDA under an Print production planner (EUA). This EUA will remain  in effect (meaning this test can be used) for the duration of the COVID-19 declaration under Section 564(b)(1) of the Act, 21 U.S.C.section 360bbb-3(b)(1), unless the authorization is terminated  or revoked sooner.       Influenza A by PCR NEGATIVE NEGATIVE Final   Influenza B by PCR NEGATIVE NEGATIVE Final    Comment: (NOTE) The Xpert Xpress  SARS-CoV-2/FLU/RSV plus assay is intended as an aid in the diagnosis of influenza from Nasopharyngeal swab specimens and should not be used as a sole basis for treatment. Nasal washings and aspirates are unacceptable for Xpert Xpress SARS-CoV-2/FLU/RSV testing.  Fact Sheet for Patients: EntrepreneurPulse.com.au  Fact Sheet for Healthcare Providers: IncredibleEmployment.be  This test is not yet approved or cleared by the [place] FDA and has been authorized for detection and/or diagnosis of SARS-CoV-2 by FDA under an Emergency Use Authorization (EUA). This EUA will remain in effect (meaning this test can be used) for the duration of the COVID-19 declaration under Section 564(b)(1) of the Act, 21 U.S.C. section 360bbb-3(b)(1), unless the authorization is terminated or revoked.  Performed at [facility], [place] [address] [place], [place] [address]      Time coordinating discharge: Over 30 minutes  SIGNED:   Charlynne Cousins, MD  [facility] 01/13/2021, 1:31 PM Pager   If 7PM-7AM, please contact night-coverage [email or web address] Password [email or web address]

## 2021-01-13 NOTE — TOC Transition Note (Signed)
Transition of Care [facility]) - CM/SW Discharge Note   Patient Details  Name: [name] [name] MRN: [id number] Date of Birth: 1950/11/14  [facility] CM/SW Contact:  Pollie Friar, RN Phone Number: 01/13/2021, 2:18 PM   Clinical Narrative:    Patient is discharging to CIR today. CM signing off.   Final next level of care: IP Rehab Facility Barriers to Discharge: No Barriers Identified   Patient Goals and CMS Choice        Discharge Placement                       Discharge Plan and Services                                     Social Determinants of Health (SDOH) Interventions     Readmission Risk Interventions No flowsheet data found.

## 2021-01-13 NOTE — Progress Notes (Signed)
ANTICOAGULATION CONSULT NOTE - Initial Consult  Pharmacy Consult for Warfarin Indication: MVR  Allergies  Allergen Reactions  . Erythromycin Nausea And Vomiting  . Valsartan Other (See Comments)    Pt reports caused her depression    Patient Measurements: Height: 5\' 7"  (170.2 cm) Weight: 69.4 kg (153 lb) IBW/kg (Calculated) : 61.6  Vital Signs: Temp: 98 F (36.7 C) (03/28 0715) Temp Source: Oral (03/28 0715) BP: 144/73 (03/28 0715) Pulse Rate: 74 (03/28 0715)  Labs: Recent Labs    01/11/21 1427 01/11/21 1440 01/12/21 0207 01/13/21 0502  HGB 13.9 14.3  --  12.6  HCT 41.0 42.9  --  36.8  PLT  --  263  --  211  APTT  --  26  --   --   LABPROT  --  23.8* 24.9* 31.6*  INR  --  2.2* 2.3* 3.2*  CREATININE 0.70 0.78  --   --     Estimated Creatinine Clearance: 62.7 mL/min (by C-G formula based on SCr of 0.78 mg/dL).   Medical History: Past Medical History:  Diagnosis Date  . Anxiety   . Arthritis    knees  . Breast cancer ([place]) 1999  . CHF (congestive heart failure) ([place])    Related to severe mitral regurgitation, April, 2013  . 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 Assess by pulmonary team in the hospital April, 2013  . Ejection fraction    EF 60%, echo, April, 2013, with severe MR before mitral valve replacement  . Herpes   . Hypothyroidism   . IBS (irritable bowel syndrome)   . Mitral valve regurgitation    Mitral valve replacement April, 2013, Mitral valve prolapse  . Multiple sclerosis ([place])   . Neurogenic bladder   . Osteoporosis   . Ovarian cyst   . Paroxysmal atrial fibrillation (H[facility])    Rapid atrial fibrillation in-hospital, Rapid cardioversion,  before mitral valve surgery  . Pulmonary hypertension ([place])    Echo, April, 2013, before mitral valve surgery  . S/P Maze operation for atrial fibrillation 01/26/2012   Complete biatrial lesion set using cryothermy via right mini thoracotomy  . S/P mitral valve  replacement 01/26/2012   86mm Sorin [employer] Optiform mechanical prosthesis via right mini thoracotomy  . Warfarin anticoagulation    Mechanical mitral prosthesis, April, 20136    Medications:  Scheduled:  . aspirin EC  81 mg Oral Daily  . atorvastatin  80 mg Oral Daily  . calcium carbonate  1 tablet Oral Q breakfast  . cholecalciferol  6,000 Units Oral Daily  . meclizine  12.5 mg Oral TID  . metoprolol tartrate  25 mg Oral BID  . thyroid  90 mg Oral Daily  . Warfarin - Pharmacist Dosing Inpatient   Does not apply q1600    Assessment: Patient is a 25 yof that presented to the ED with concerns for a stroke. The patient is on warfarin for a MVR with an INR goal of 2.5-3.5. PTA Regimen: 7.5mg  M /Thur and 5mg  all other days  Patient's INR 2.3>>3.2 today. CBC stable. Large increase in INR today, will decrease dose.   Goal of Therapy:  INR 2.5-3.5 Monitor platelets by anticoagulation protocol: Yes   Plan:  - INR currently therapeutic at 3.2 - Plan for Warfarin 5mg  PO x 1 dose tonight  - Monitor daily INR and CBC - Monitor patient for s/s of bleeding   Nili Honda A. Levada Dy, PharmD, BCPS, [facility] Clinical Pharmacist [place] Please  utilize Amion for appropriate phone number to reach the unit pharmacist ([place])   01/13/2021 7:30 AM

## 2021-01-13 NOTE — Plan of Care (Signed)

## 2021-01-13 NOTE — H&P (Incomplete)
Physical Medicine and Rehabilitation Admission H&P    Chief Complaint  Patient presents with  . 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  Per chart review lives alone independent with assistive device.  Two-level home 4 steps to entry.  She does not drive.  Neighbors help provide transportation to appointments.  Presented 01/11/2021 with acute onset of right side weakness and aphasia.  Cranial CT scan showed no acute intracranial hemorrhage or evidence of acute infarction.  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  No hemodynamically significant stenosis.  Patient did not receive TPA.  MRI showed a wedge-shaped area of diffusion on the left paramedian pons suspicious for acute infarction.  Stable burden of chronic demyelinating disease.  No evidence of active demyelination.  MRI cervical spine no cord signal abnormality or impingement.  Admission chemistries unremarkable except BUN 27 glucose 143 INR 2.2 hemoglobin 14.3, urinalysis positive nitrite.  Echocardiogram with ejection fraction of 65 to 70% no wall motion abnormalities.  Patient presently remains on Coumadin as prior to admission with the addition of low-dose aspirin.  Presently maintained on Rocephin for UTI with urine culture pending.  Tolerating a regular consistency diet.  Therapy evaluations completed due to patient's right side weakness and aphasia patient was admitted for a comprehensive rehab program  Review of Systems  Constitutional: Negative for chills and fever.  HENT: Negative for hearing loss.   Eyes: Negative for blurred vision and  double vision.  Respiratory: Negative for cough.        Shortness of breath with exertion  Cardiovascular: Positive for palpitations and leg swelling.  Gastrointestinal: Positive for constipation. Negative for heartburn, nausea and vomiting.  Genitourinary: Positive for urgency. Negative for dysuria and hematuria.  Musculoskeletal: Positive for falls, joint pain and myalgias.  Skin: Negative for rash.  Neurological: Positive for dizziness, speech change and weakness.  Psychiatric/Behavioral: The patient has insomnia.        Anxiety  All other systems reviewed and are negative.  Past Medical History:  Diagnosis Date  . Anxiety   . Arthritis    knees  . Breast cancer ([place]) 1999  . CHF (congestive heart failure) ([place])    Related to severe mitral regurgitation, April, 2013  . 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 Assess by pulmonary team in the hospital April, 2013  . Ejection fraction    EF 60%, echo, April, 2013, with severe MR before mitral valve replacement  . Herpes   . Hypothyroidism   . IBS (irritable bowel syndrome)   . Mitral valve regurgitation    Mitral valve replacement April, 2013, Mitral valve prolapse  . Multiple sclerosis ([place])   . Neurogenic bladder   . Osteoporosis   . Ovarian cyst   . Paroxysmal atrial fibrillation (HCC)    Rapid atrial fibrillation in-hospital, Rapid cardioversion,  before mitral valve surgery  . Pulmonary hypertension ([place])    Echo, April, 2013, before mitral valve surgery  . S/P Maze operation for atrial fibrillation 01/26/2012   Complete biatrial lesion set using cryothermy via right mini thoracotomy  . S/P mitral valve replacement 01/26/2012   35mm Sorin [employer] Optiform mechanical prosthesis via  right mini thoracotomy  . Warfarin anticoagulation    Mechanical mitral prosthesis, April, 20136   Past Surgical History:  Procedure Laterality Date  . BREAST LUMPECTOMY Right 1999   with sent.node, and  axillary dissection (20)  . CHEST TUBE INSERTION  01/26/2012   Procedure: CHEST TUBE INSERTION;  Surgeon: Rexene Alberts, MD;  Location: [place];  Service: Open Heart Surgery;  Laterality: Left;  . COLONOSCOPY  2010   "normal"  . CYSTOSCOPY  1992  . LAPAROSCOPIC OVARIAN CYSTECTOMY  1978   urethral stricture repair  . LEFT AND RIGHT HEART CATHETERIZATION WITH CORONARY ANGIOGRAM N/A 01/20/2012   Procedure: LEFT AND RIGHT HEART CATHETERIZATION WITH CORONARY ANGIOGRAM;  Surgeon: Burnell Blanks, MD;  Location: [facility] CATH LAB;  Service: Cardiovascular;  Laterality: N/A;  . LYMPHADENECTOMY    . MAZE  01/26/2012   Procedure: MAZE;  Surgeon: Rexene Alberts, MD;  Location: [place];  Service: Open Heart Surgery;  Laterality: N/A;  . MITRAL VALVE REPLACEMENT  01/26/2012   Procedure: MINIMALLY INVASIVE MITRAL VALVE (MV) REPLACEMENT;  Surgeon: Rexene Alberts, MD;  Location: [place];  Service: Open Heart Surgery;  Laterality: Right;  . TEE WITHOUT CARDIOVERSION  01/19/2012   Procedure: TRANSESOPHAGEAL ECHOCARDIOGRAM (TEE);  Surgeon: Peter M Martinique, MD;  [facility] END[facility];  Service: Cardiovascular;  Laterality: N/A;  . TONSILLECTOMY  1970  . [place]  . WRIST SURGERY Right 2012   Family History  Problem Relation Age of Onset  . Heart disease Father        cardiac arrest   . CAD Father   . Hypertension Father   . CAD Mother        5 stents and numerous bypass surgery  . Hypertension Mother   . Hyperlipidemia Mother   . CAD Other   . Breast cancer Maternal Grandmother   . Breast cancer Maternal Aunt    Social History:  reports that she has never smoked. She has never used smokeless tobacco. She reports that she does not drink alcohol and does not use drugs. Allergies:  Allergies  Allergen Reactions  . Erythromycin Nausea And Vomiting  . Valsartan Other (See Comments)    Pt reports caused her depression   Medications Prior to Admission  Medication Sig Dispense Refill  .  ARMOUR THYROID 90 MG tablet Take 90 mg by mouth daily.    . Biotin 5000 MCG CAPS Take 10,000 mcg by mouth in the morning and at bedtime.    . Black Cohosh 40 MG CAPS Take 40 mg by mouth every evening.    . Bromelains (BROMELAIN PO) Take 1 capsule by mouth 2 (two) times daily.    . calcium carbonate (OSCAL) 1500 (600 Ca) MG TABS tablet Take 1,500 mg by mouth daily. Takes 1 time daily    . Cholecalciferol (VITAMIN D3) 2000 units capsule Take 4,000 Units by mouth daily.    . Coenzyme Q10 (CO Q-10) 100 MG CAPS Take 1 capsule by mouth daily.     . Cranberry 500 MG CAPS Take 1 capsule by mouth daily.    Marland Kitchen L-THEANINE PO Take 1 capsule by mouth daily.     Marland Kitchen lisinopril (ZESTRIL) 5 MG tablet Take 1 tablet (5 mg total) by mouth daily. 90 tablet 1  . Lysine 500 MG CAPS Take 1 capsule by mouth daily.    . Magnesium Citrate 200 MG TABS Take 200 mg by mouth daily.    . metoprolol tartrate (LOPRESSOR)  25 MG tablet TAKE 1 TAB (25 MG) PO TWICE DAILY - pt must make appt with provider for further refills - 1st attempt (Patient taking differently: Take 25 mg by mouth 2 (two) times daily.) 180 tablet 3  . MILK THISTLE PO Take 1 capsule by mouth daily.    . Misc Natural Products (GLUCOSAMINE CHOND COMPLEX/MSM PO) Take 1 tablet by mouth in the morning and at bedtime. Strength of dose Glucosamine 1500mg  /Chodroitron 1000mg / MSM 500mg  takes one twice daily    . Olive Leaf 500 MG CAPS Take 1 capsule by mouth 2 (two) times daily.     . Petasin (PETADOLEX PO) Take 1 tablet by mouth 2 (two) times daily.    . Probiotic Product (PROBIOTIC DAILY PO) Take 1 capsule by mouth daily. Take 1 capsule once a day    . PROGESTERONE MICRONIZED PO Take 100 mg by mouth daily.     . Pumpkin Seed 500 MG TABS Take 1 tablet by mouth 2 (two) times daily.    . TURMERIC PO Take 1 capsule by mouth daily.    . vitamin A 10000 UNIT capsule Take 10,000 Units by mouth daily.    . vitamin B-12 (CYANOCOBALAMIN) 1000 MCG tablet Take 1,000 mcg by mouth  daily. Taking sublingal    . vitamin E 400 UNIT capsule Take 400 Units by mouth daily.    Marland Kitchen warfarin (COUMADIN) 5 MG tablet TAKE 1 TABLET DAILY EXCEPT 1.5 TABLETS ON MONDAY AND THURSDAY  BY MOUTH EVERY DAY OR AS DIRECTED BY COUMADIN CLINIC (Patient taking differently: Take 5-7.5 mg by mouth as directed. Take 1.5 tablet (7.5 mg) on (Mon,Thurs) & Take 1 tablet (5 mg) All Other Days As Directed By Coumadin Clinic) 110 tablet 0  . amoxicillin (AMOXIL) 500 MG capsule Take 2,000 mg by mouth once.  0  . lactose free nutrition (BOOST PLUS) LIQD Take 237 mLs by mouth daily.     Marland Kitchen lidocaine-prilocaine (EMLA) cream Apply 1 application topically as needed (per pt this is for use before blood draws).   0    Drug Regimen Review Drug regimen was reviewed and remains appropriate with no significant issues identified  Home: Home Living Family/patient expects to be discharged to:: Private residence Living Arrangements: Alone Available Help at Discharge: Friend(s),Available PRN/intermittently Type of Home: House Home Access: Stairs to enter Technical brewer of Steps: 4 Entrance Stairs-Rails: Can reach both Home Layout: Two level Alternate Level Stairs-Number of Steps: 14 Alternate Level Stairs-Rails: Can reach both Bathroom Shower/Tub: Multimedia programmer: Standard Home Equipment: Environmental consultant - 2 wheels,Other (comment),Shower seat - built in,Grab bars - toilet,Grab bars - tub/shower,Cane - single point (tray for RW) Additional Comments: has a cat   Functional History: Prior Function Level of Independence: Independent with assistive device(s) Comments: Does not drive; neighbors take care of pt as needed and assist with transport; lively mobile for fall button. Uses walker  Functional Status:  Mobility: Bed Mobility Overal bed mobility: Needs Assistance Bed Mobility: Supine to Sit Supine to sit: Min assist General bed mobility comments: Increased time, minA to pull self toward EOB for  trunk elevation Transfers Overall transfer level: Needs assistance Equipment used: Rolling walker (2 wheeled) Transfers: Sit to/from Stand Sit to Stand: Min guard General transfer comment: [email or web address] with increased time and momentum used. hand placement assist requried Ambulation/Gait Ambulation/Gait assistance: Min guard Gait Distance (Feet): 6 Feet Assistive device: Rolling walker (2 wheeled) Gait Pattern/deviations: Step-through pattern,Decreased stride length,Trunk flexed,Narrow base of support General Gait Details:  Min guard assist for balance, increased trunk flexion, fatigues easily requiring a seated rest break Gait velocity: decreased Gait velocity interpretation: <1.31 ft/sec, indicative of household ambulator    ADL: ADL Overall ADL's : Needs assistance/impaired Eating/Feeding: Set up,Sitting Grooming: Set up,Sitting Upper Body Bathing: Set up,Sitting Lower Body Bathing: Maximal assistance,Sitting/lateral leans,Sit to/from stand,Cueing for safety Upper Body Dressing : Set up,Sitting Lower Body Dressing: Maximal assistance,Sit to/from stand,Sitting/lateral leans,Cueing for safety Toilet Transfer: Minimal assistance,Stand-pivot,BSC,RW Toilet Transfer Details (indicate cue type and reason): Increased time and BSC swapped with recliner Toileting- Clothing Manipulation and Hygiene: Maximal assistance,Sitting/lateral lean,Sit to/from stand Functional mobility during ADLs: Min guard,Cueing for safety,Rolling walker General ADL Comments: Pt limited by decreased strength, decreased coordination, adn decreased ability to care for self. Speech appears to have resolved, but pt unsteady with mobility and unable to assist with LB ADL in standing due to tneed to hold onto RW.  Cognition: Cognition Overall Cognitive Status: Within Functional Limits for tasks assessed Orientation Level: Oriented X4 Cognition Arousal/Alertness: Awake/alert Behavior During Therapy: WFL for tasks  assessed/performed Overall Cognitive Status: Within Functional Limits for tasks assessed  Physical Exam: Blood pressure (!) 144/73, pulse 74, temperature 98 F (36.7 C), temperature source Oral, resp. rate 18, height 5\' 7"  (1.702 m), weight 69.4 kg, SpO2 96 %. Physical Exam Neurological:     Comments: Patient is alert in no acute distress.  Makes eye contact with examiner.  Provides name and age.  Some mild delay in processing with low tone.  She was able to speak in short phrases.     Results for orders placed or performed during the hospital encounter of 01/11/21 (from the past 48 hour(s))  CBG monitoring, ED     Status: None   Collection Time: 01/11/21  2:21 PM  Result Value Ref Range   Glucose-Capillary 89 70 - 99 mg/dL    Comment: Glucose reference range applies only to samples taken after fasting for at least 8 hours.  I-stat chem 8, ED     Status: Abnormal   Collection Time: 01/11/21  2:27 PM  Result Value Ref Range   Sodium 138 135 - 145 mmol/L   Potassium 3.5 3.5 - 5.1 mmol/L   Chloride 102 98 - 111 mmol/L   BUN 27 (H) 8 - 23 mg/dL   Creatinine, Ser 0.70 0.44 - 1.00 mg/dL   Glucose, Bld 143 (H) 70 - 99 mg/dL    Comment: Glucose reference range applies only to samples taken after fasting for at least 8 hours.   Calcium, Ion 1.04 (L) 1.15 - 1.40 mmol/L   TCO2 25 22 - 32 mmol/L   Hemoglobin 13.9 12.0 - 15.0 g/dL   HCT 41.0 36.0 - 46.0 %  Protime-INR     Status: Abnormal   Collection Time: 01/11/21  2:40 PM  Result Value Ref Range   Prothrombin Time 23.8 (H) 11.4 - 15.2 seconds   INR 2.2 (H) 0.8 - 1.2    Comment: (NOTE) INR goal varies based on device and disease states. Performed at [facility], [place] [address], [place], [place] [address]   APTT     Status: None   Collection Time: 01/11/21  2:40 PM  Result Value Ref Range   aPTT 26 24 - 36 seconds    Comment: Performed at [facility] [address] [place], [place] [address]  CBC     Status: None    Collection Time: 01/11/21  2:40 PM  Result Value Ref  Range   WBC 6.5 4.0 - 10.5 K/uL   RBC 4.50 3.87 - 5.11 MIL/uL   Hemoglobin 14.3 12.0 - 15.0 g/dL   HCT 42.9 36.0 - 46.0 %   MCV 95.3 80.0 - 100.0 fL   MCH 31.8 26.0 - 34.0 pg   MCHC 33.3 30.0 - 36.0 g/dL   RDW 13.1 11.5 - 15.5 %   Platelets 263 150 - 400 K/uL   nRBC 0.0 0.0 - 0.2 %    Comment: Performed at [facility], [place] [address] [place], [place] [address]  Differential     Status: None   Collection Time: 01/11/21  2:40 PM  Result Value Ref Range   Neutrophils Relative % 53 %   Neutro Abs 3.5 1.7 - 7.7 K/uL   Lymphocytes Relative 35 %   Lymphs Abs 2.2 0.7 - 4.0 K/uL   Monocytes Relative 10 %   Monocytes Absolute 0.6 0.1 - 1.0 K/uL   Eosinophils Relative 1 %   Eosinophils Absolute 0.1 0.0 - 0.5 K/uL   Basophils Relative 1 %   Basophils Absolute 0.0 0.0 - 0.1 K/uL   Immature Granulocytes 0 %   Abs Immature Granulocytes 0.01 0.00 - 0.07 K/uL    Comment: Performed at [facility], [place] [address] [place], [place] [address]  Comprehensive metabolic panel     Status: Abnormal   Collection Time: 01/11/21  2:40 PM  Result Value Ref Range   Sodium 137 135 - 145 mmol/L   Potassium 3.6 3.5 - 5.1 mmol/L   Chloride 101 98 - 111 mmol/L   CO2 25 22 - 32 mmol/L   Glucose, Bld 146 (H) 70 - 99 mg/dL    Comment: Glucose reference range applies only to samples taken after fasting for at least 8 hours.   BUN 27 (H) 8 - 23 mg/dL   Creatinine, Ser 0.78 0.44 - 1.00 mg/dL   Calcium 9.1 8.9 - 10.3 mg/dL   Total Protein 6.8 6.5 - 8.1 g/dL   Albumin 3.3 (L) 3.5 - 5.0 g/dL   AST 25 15 - 41 U/L   ALT 20 0 - 44 U/L   Alkaline Phosphatase 72 38 - 126 U/L   Total Bilirubin 0.8 0.3 - 1.2 mg/dL   GFR, Estimated >60 >60 mL/min    Comment: (NOTE) Calculated using the CKD-EPI Creatinine Equation (2021)    Anion gap 11 5 - 15    Comment: Performed at [facility] [address], [place], [place] [address]  Resp Panel by  RT-PCR (Flu A&B, Covid) Nasopharyngeal Swab     Status: None   Collection Time: 01/11/21  3:21 PM   Specimen: Nasopharyngeal Swab; Nasopharyngeal(NP) swabs in vial transport medium  Result Value Ref Range   SARS Coronavirus 2 by RT PCR NEGATIVE NEGATIVE    Comment: (NOTE) SARS-CoV-2 target nucleic acids are NOT DETECTED.  The SARS-CoV-2 RNA is generally detectable in upper respiratory specimens during the acute phase of infection. The lowest concentration of SARS-CoV-2 viral copies this assay can detect is 138 copies/mL. A negative result does not preclude SARS-Cov-2 infection and should not be used as the sole basis for treatment or other patient management decisions. A negative result may occur with  improper specimen collection/handling, submission of specimen other than nasopharyngeal swab, presence of viral mutation(s) within the areas targeted by this assay, and inadequate number of viral copies(<138 copies/mL). A negative result must be combined with clinical observations, patient history, and  epidemiological information. The expected result is Negative.  Fact Sheet for Patients:  EntrepreneurPulse.com.au  Fact Sheet for Healthcare Providers:  IncredibleEmployment.be  This test is no t yet approved or cleared by the [place] FDA and  has been authorized for detection and/or diagnosis of SARS-CoV-2 by FDA under an Emergency Use Authorization (EUA). This EUA will remain  in effect (meaning this test can be used) for the duration of the COVID-19 declaration under Section 564(b)(1) of the Act, 21 U.S.C.section 360bbb-3(b)(1), unless the authorization is terminated  or revoked sooner.       Influenza A by PCR NEGATIVE NEGATIVE   Influenza B by PCR NEGATIVE NEGATIVE    Comment: (NOTE) The Xpert Xpress SARS-CoV-2/FLU/RSV plus assay is intended as an aid in the diagnosis of influenza from Nasopharyngeal swab specimens and should not be  used as a sole basis for treatment. Nasal washings and aspirates are unacceptable for Xpert Xpress SARS-CoV-2/FLU/RSV testing.  Fact Sheet for Patients: EntrepreneurPulse.com.au  Fact Sheet for Healthcare Providers: IncredibleEmployment.be  This test is not yet approved or cleared by the [place] FDA and has been authorized for detection and/or diagnosis of SARS-CoV-2 by FDA under an Emergency Use Authorization (EUA). This EUA will remain in effect (meaning this test can be used) for the duration of the COVID-19 declaration under Section 564(b)(1) of the Act, 21 U.S.C. section 360bbb-3(b)(1), unless the authorization is terminated or revoked.  Performed at [facility], [place] [name] [place], [place] [address]   Urinalysis, Routine w reflex microscopic     Status: Abnormal   Collection Time: 01/11/21  7:04 PM  Result Value Ref Range   Color, Urine YELLOW YELLOW   APPearance HAZY (A) CLEAR   Specific Gravity, Urine 1.042 (H) 1.005 - 1.030   pH 6.0 5.0 - 8.0   Glucose, UA NEGATIVE NEGATIVE mg/dL   Hgb urine dipstick SMALL (A) NEGATIVE   Bilirubin Urine NEGATIVE NEGATIVE   Ketones, ur 20 (A) NEGATIVE mg/dL   Protein, ur NEGATIVE NEGATIVE mg/dL   Nitrite POSITIVE (A) NEGATIVE   Leukocytes,Ua TRACE (A) NEGATIVE   RBC / HPF 0-5 0 - 5 RBC/hpf   WBC, UA 6-10 0 - 5 WBC/hpf   Bacteria, UA RARE (A) NONE SEEN   Squamous Epithelial / LPF 0-5 0 - 5    Comment: Performed at [facility], [address], [place], [place] [address]  TSH     Status: None   Collection Time: 01/11/21  8:36 PM  Result Value Ref Range   TSH 0.371 0.350 - 4.500 uIU/mL    Comment: Performed by a 3rd Generation assay with a functional sensitivity of <=0.01 uIU/mL. Performed at [facility], [place] [address] [place], [place] [address]   Hemoglobin A1c     Status: None   Collection Time: 01/12/21  2:07 AM  Result Value Ref Range   Hgb A1c MFr Bld 5.5  4.8 - 5.6 %    Comment: (NOTE) Pre diabetes:          5.7%-6.4%  Diabetes:              >6.4%  Glycemic control for   <7.0% adults with diabetes    Mean Plasma Glucose 111.15 mg/dL    Comment: Performed at [facility] [address], [place], [place] [address]  Lipid panel     Status: Abnormal   Collection Time: 01/12/21  2:07 AM  Result Value Ref Range  Cholesterol 204 (H) 0 - 200 mg/dL   Triglycerides 53 <150 mg/dL   HDL 62 >40 mg/dL   Total CHOL/HDL Ratio 3.3 RATIO   VLDL 11 0 - 40 mg/dL   LDL Cholesterol 131 (H) 0 - 99 mg/dL    Comment:        Total Cholesterol/HDL:CHD Risk Coronary Heart Disease Risk Table                     Men   Women  1/2 Average Risk   3.4   3.3  Average Risk       5.0   4.4  2 X Average Risk   9.6   7.1  3 X Average Risk  23.4   11.0        Use the calculated Patient Ratio above and the CHD Risk Table to determine the patient's CHD Risk.        ATP III CLASSIFICATION (LDL):  <100     mg/dL   Optimal  100-129  mg/dL   Near or Above                    Optimal  130-159  mg/dL   Borderline  160-189  mg/dL   High  >190     mg/dL   Very High Performed at [facility] [address] [place], [place] [address]   Protime-INR     Status: Abnormal   Collection Time: 01/12/21  2:07 AM  Result Value Ref Range   Prothrombin Time 24.9 (H) 11.4 - 15.2 seconds   INR 2.3 (H) 0.8 - 1.2    Comment: (NOTE) INR goal varies based on device and disease states. Performed at [facility], [place] [address], [place], [place] [address]   Protime-INR     Status: Abnormal   Collection Time: 01/13/21  5:02 AM  Result Value Ref Range   Prothrombin Time 31.6 (H) 11.4 - 15.2 seconds   INR 3.2 (H) 0.8 - 1.2    Comment: (NOTE) INR goal varies based on device and disease states. Performed at [facility], [place] [address], [place], [place] [address]   CBC     Status: None   Collection Time: 01/13/21  5:02 AM  Result Value Ref Range   WBC  6.0 4.0 - 10.5 K/uL   RBC 3.93 3.87 - 5.11 MIL/uL   Hemoglobin 12.6 12.0 - 15.0 g/dL   HCT 36.8 36.0 - 46.0 %   MCV 93.6 80.0 - 100.0 fL   MCH 32.1 26.0 - 34.0 pg   MCHC 34.2 30.0 - 36.0 g/dL   RDW 13.5 11.5 - 15.5 %   Platelets 211 150 - 400 K/uL   nRBC 0.0 0.0 - 0.2 %    Comment: Performed at [facility], [place] [address] [place], [place] [address]   CT ANGIO HEAD W OR WO CONTRAST  Result Date: 01/11/2021 CLINICAL DATA:  Code stroke EXAM: CT HEAD WITHOUT CONTRAST CT ANGIOGRAPHY OF THE HEAD AND NECK TECHNIQUE: Contiguous axial images were obtained from the base of the skull through the vertex without intravenous contrast. Multidetector CT imaging of the head and neck was performed using the standard protocol during bolus administration of intravenous contrast. Multiplanar CT image reconstructions and MIPs were obtained to evaluate the vascular anatomy. Carotid stenosis measurements (when applicable) are obtained utilizing NASCET criteria, using the distal internal carotid diameter as the denominator. CONTRAST:  75mL  OMNIPAQUE IOHEXOL 350 MG/ML SOLN COMPARISON:  None. FINDINGS: CT HEAD Brain: No acute intracranial hemorrhage, mass effect, or edema. Gray-white differentiation is preserved. Patchy hypoattenuation in the supratentorial white matter is nonspecific but probably reflects chronic microvascular ischemic changes. Prominence of the ventricles and sulci reflects generalized parenchymal volume loss. No extra-axial collection. Vascular: No hyperdense vessel. Skull: Unremarkable. Sinuses/Orbits: No acute abnormality. Other: Mastoid air cells are clear. ASPECTS ([place] Stroke Program Early CT Score) - Ganglionic level infarction (caudate, lentiform nuclei, internal capsule, insula, M1-M3 cortex): 7 - Supraganglionic infarction (M4-M6 cortex): 3 Total score (0-10 with 10 being normal): 10 CTA NECK Aortic arch: Great vessel origins are patent. There is mixed plaque along the innominate extending  into the subclavian origin with less than 50% stenosis. Right carotid system: Patent. Atherosclerotic wall thickening with eccentric plaque along the proximal common carotid causing less than 50% stenosis. Minimal calcified plaque at the bifurcation. No stenosis at the ICA origin. Left carotid system: Patent. Mild calcified plaque at the ICA origin without stenosis. Vertebral arteries:Left vertebral artery is patent. Right V1 vertebral artery is not opacified. There is faint enhancement of the right V2 and V3 segments. Skeleton: Degenerative changes of the cervical spine. Other neck: Scarring at the right lung apex with traction bronchiectasis. CTA HEAD Anterior circulation: Intracranial internal carotid arteries are patent with minimal calcified plaque. Anterior and middle cerebral arteries are patent. Posterior circulation: Intracranial vertebral arteries are patent. Improved opacification of the right vertebral artery beyond the PICA origin probably related to retrograde flow. Basilar artery is patent. Posterior cerebral arteries are patent. Bilateral posterior communicating arteries are present, right larger than left. Venous sinuses: Not well evaluated. IMPRESSION: There is no acute intracranial hemorrhage or evidence of acute infarction. ASPECT score is 10. 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 Patent intracranially with greater flow beyond PICA origin likely from retrograde flow. Otherwise, no hemodynamically significant stenosis. Initial results were communicated to Dr. Cheral Marker at 2:39 p.m. on 01/11/2021 by text page via the [facility] messaging system. Electronically Signed   By: Macy Mis M.D.   On: 01/11/2021 14:50   CT ANGIO NECK W OR WO CONTRAST  Result Date: 01/11/2021 CLINICAL DATA:  Code stroke EXAM: CT HEAD WITHOUT CONTRAST CT ANGIOGRAPHY OF THE HEAD AND NECK TECHNIQUE: Contiguous axial  images were obtained from the base of the skull through the vertex without intravenous contrast. Multidetector CT imaging of the head and neck was performed using the standard protocol during bolus administration of intravenous contrast. Multiplanar CT image reconstructions and MIPs were obtained to evaluate the vascular anatomy. Carotid stenosis measurements (when applicable) are obtained utilizing NASCET criteria, using the distal internal carotid diameter as the denominator. CONTRAST:  57mL OMNIPAQUE IOHEXOL 350 MG/ML SOLN COMPARISON:  None. FINDINGS: CT HEAD Brain: No acute intracranial hemorrhage, mass effect, or edema. Gray-white differentiation is preserved. Patchy hypoattenuation in the supratentorial white matter is nonspecific but probably reflects chronic microvascular ischemic changes. Prominence of the ventricles and sulci reflects generalized parenchymal volume loss. No extra-axial collection. Vascular: No hyperdense vessel. Skull: Unremarkable. Sinuses/Orbits: No acute abnormality. Other: Mastoid air cells are clear. ASPECTS ([place] Stroke Program Early CT Score) - Ganglionic level infarction (caudate, lentiform nuclei, internal capsule, insula, M1-M3 cortex): 7 - Supraganglionic infarction (M4-M6 cortex): 3 Total score (0-10 with 10 being normal): 10 CTA NECK Aortic arch: Great vessel origins are patent. There is mixed plaque along the innominate extending into the subclavian origin  with less than 50% stenosis. Right carotid system: Patent. Atherosclerotic wall thickening with eccentric plaque along the proximal common carotid causing less than 50% stenosis. Minimal calcified plaque at the bifurcation. No stenosis at the ICA origin. Left carotid system: Patent. Mild calcified plaque at the ICA origin without stenosis. Vertebral arteries:Left vertebral artery is patent. Right V1 vertebral artery is not opacified. There is faint enhancement of the right V2 and V3 segments. Skeleton: Degenerative  changes of the cervical spine. Other neck: Scarring at the right lung apex with traction bronchiectasis. CTA HEAD Anterior circulation: Intracranial internal carotid arteries are patent with minimal calcified plaque. Anterior and middle cerebral arteries are patent. Posterior circulation: Intracranial vertebral arteries are patent. Improved opacification of the right vertebral artery beyond the PICA origin probably related to retrograde flow. Basilar artery is patent. Posterior cerebral arteries are patent. Bilateral posterior communicating arteries are present, right larger than left. Venous sinuses: Not well evaluated. IMPRESSION: There is no acute intracranial hemorrhage or evidence of acute infarction. ASPECT score is 10. 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 Patent intracranially with greater flow beyond PICA origin likely from retrograde flow. Otherwise, no hemodynamically significant stenosis. Initial results were communicated to Dr. Cheral Marker at 2:39 p.m. on 01/11/2021 by text page via the [facility] messaging system. Electronically Signed   By: Macy Mis M.D.   On: 01/11/2021 14:50   MR BRAIN W WO CONTRAST  Addendum Date: 01/11/2021   ADDENDUM REPORT: 01/11/2021 18:58 ADDENDUM: There is a wedge-shaped area of diffusion hyperintensity at the left paramedian pons without corresponding T2 hyperintensity suspicious for acute perforator infarct. Updated findings were discussed with Dr. Cheral Marker on 01/11/2021 at 6:50 p.m. Punctate focus of diffusion hyperintensity in the posterior left cerebellum is without correlate on coronal DWI. Electronically Signed   By: Macy Mis M.D.   On: 01/11/2021 18:58   Result Date: 01/11/2021 CLINICAL DATA:  Code stroke follow-up, history of multiple sclerosis EXAM: MRI HEAD WITHOUT AND WITH CONTRAST TECHNIQUE: Multiplanar, multiecho pulse sequences of the brain and  surrounding structures were obtained without and with intravenous contrast. CONTRAST:  74mL GADAVIST GADOBUTROL 1 MMOL/ML IV SOLN COMPARISON:  12/14/2019 FINDINGS: Brain: There is no acute infarction or intracranial hemorrhage. There is no intracranial mass, mass effect, or edema. There is no hydrocephalus or extra-axial fluid collection. Foci of T2 hyperintensity are present in the periventricular greater than subcortical white matter consistent with history of multiple sclerosis. A few small foci are also present within the cerebellum. No change since the prior study. Prominence of the ventricles and sulci reflects stable parenchymal volume loss. No abnormal enhancement. Vascular: Major vessel flow voids at the skull base are preserved. Skull and upper cervical spine: Normal marrow signal is preserved. Sinuses/Orbits: Minor mucosal thickening.  Orbits are unremarkable. Other: Sella is unremarkable.  Mastoid air cells are clear. IMPRESSION: No acute infarction. Stable burden of chronic demyelinating disease. No evidence of active demyelination. Stable parenchymal volume loss. Electronically Signed: By: Macy Mis M.D. On: 01/11/2021 17:16   MR CERVICAL SPINE WO CONTRAST  Result Date: 01/12/2021 CLINICAL DATA:  Acute or progressive myelopathy EXAM: MRI CERVICAL SPINE WITHOUT CONTRAST TECHNIQUE: Multiplanar, multisequence MR imaging of the cervical spine was performed. No intravenous contrast was administered. COMPARISON:  02/18/2017 FINDINGS: Alignment: Exaggerated cervical lordosis. Vertebrae: No fracture, evidence of discitis, or bone lesion. Cord: Normal signal. Posterior Fossa, vertebral arteries, paraspinal tissues: Intracranial findings described on brain MRI from yesterday.  No perispinal mass or inflammation noted Disc levels: C2-3: Mild facet spurring. C3-4: Asymmetric left facet spurring with ankylosis C4-5: Ligamentum flavum thickening. Degenerative facet spurring. No neural compression C5-6: Disc  narrowing and bulging. Small central disc protrusion. Mild facet spurring with ligamentum flavum thickening. Degenerative interspinous ganglion formation. C6-7: Narrowing of the disc with mild bulging. Borderline facet spurring. C7-T1:Degenerative facet spurring on the left more than right. No herniation or impingement. IMPRESSION: 1. No cord signal abnormality or impingement. 2. Noncompressive degenerative changes are described above. Electronically Signed   By: Monte Fantasia M.D.   On: 01/12/2021 07:30   CT HEAD CODE STROKE WO CONTRAST  Result Date: 01/11/2021 CLINICAL DATA:  Code stroke EXAM: CT HEAD WITHOUT CONTRAST CT ANGIOGRAPHY OF THE HEAD AND NECK TECHNIQUE: Contiguous axial images were obtained from the base of the skull through the vertex without intravenous contrast. Multidetector CT imaging of the head and neck was performed using the standard protocol during bolus administration of intravenous contrast. Multiplanar CT image reconstructions and MIPs were obtained to evaluate the vascular anatomy. Carotid stenosis measurements (when applicable) are obtained utilizing NASCET criteria, using the distal internal carotid diameter as the denominator. CONTRAST:  5mL OMNIPAQUE IOHEXOL 350 MG/ML SOLN COMPARISON:  None. FINDINGS: CT HEAD Brain: No acute intracranial hemorrhage, mass effect, or edema. Gray-white differentiation is preserved. Patchy hypoattenuation in the supratentorial white matter is nonspecific but probably reflects chronic microvascular ischemic changes. Prominence of the ventricles and sulci reflects generalized parenchymal volume loss. No extra-axial collection. Vascular: No hyperdense vessel. Skull: Unremarkable. Sinuses/Orbits: No acute abnormality. Other: Mastoid air cells are clear. ASPECTS ([place] Stroke Program Early CT Score) - Ganglionic level infarction (caudate, lentiform nuclei, internal capsule, insula, M1-M3 cortex): 7 - Supraganglionic infarction (M4-M6 cortex): 3 Total  score (0-10 with 10 being normal): 10 CTA NECK Aortic arch: Great vessel origins are patent. There is mixed plaque along the innominate extending into the subclavian origin with less than 50% stenosis. Right carotid system: Patent. Atherosclerotic wall thickening with eccentric plaque along the proximal common carotid causing less than 50% stenosis. Minimal calcified plaque at the bifurcation. No stenosis at the ICA origin. Left carotid system: Patent. Mild calcified plaque at the ICA origin without stenosis. Vertebral arteries:Left vertebral artery is patent. Right V1 vertebral artery is not opacified. There is faint enhancement of the right V2 and V3 segments. Skeleton: Degenerative changes of the cervical spine. Other neck: Scarring at the right lung apex with traction bronchiectasis. CTA HEAD Anterior circulation: Intracranial internal carotid arteries are patent with minimal calcified plaque. Anterior and middle cerebral arteries are patent. Posterior circulation: Intracranial vertebral arteries are patent. Improved opacification of the right vertebral artery beyond the PICA origin probably related to retrograde flow. Basilar artery is patent. Posterior cerebral arteries are patent. Bilateral posterior communicating arteries are present, right larger than left. Venous sinuses: Not well evaluated. IMPRESSION: There is no acute intracranial hemorrhage or evidence of acute infarction. ASPECT score is 10. 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 Patent intracranially with greater flow beyond PICA origin likely from retrograde flow. Otherwise, no hemodynamically significant stenosis. Initial results were communicated to Dr. Cheral Marker at 2:39 p.m. on 01/11/2021 by text page via the [facility] messaging system. Electronically Signed   By: Macy Mis M.D.   On: 01/11/2021 14:50       Medical Problem List and  Plan: 1.  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  Antithrombotics: -DVT/anticoagulation: Coumadin per pharmacy  -antiplatelet therapy: Aspirin 81 mg daily 3. Pain Management: Tylenol as needed 4. Mood: Provide emotional support  -antipsychotic agents: N/A 5. Neuropsych: This patient is capable of making decisions on his  own behalf. 6. Skin/Wound Care: Routine skin checks 7. Fluids/Electrolytes/Nutrition: Routine in and outs with follow-up chemistries 8.  UTI.  Urinalysis positive nitrite.  Continue IV Rocephin.  Urine culture pending 9.  Hypertension.  Lopressor 25 mg twice daily.  Monitor with increased mobility 10.  Hypothyroidism.  TSH 0.371.  Continue thyroid 90 mg daily 11.  Hyperlipidemia.  Lipitor 12.  History of severe mitral regurgitation status post metal mitral valve replacement 2013.  Continue chronic Coumadin.  Cardiac rate controlled 13.  Diastolic congestive heart failure.  Monitor for any signs of fluid overload 14.  COPD.  Check oxygen saturations every shift 15.  Multiple sclerosis diagnosed 1994.  Patient has never been on modifying therapy due to concerns for potential side effects but has not had any reported documented MS flare since diagnosis. ***  Lavon Paganini Angiulli, PA-C 01/13/2021

## 2021-01-13 NOTE — Progress Notes (Signed)
Signed        PMR Admission Coordinator Pre-Admission Assessment   Patient: [name] [name] is an 70 y.o., female MRN: [id number] DOB: 07/08/1951 Height: 5' 7" (170.2 cm) Weight: 69.4 kg                                                                                                                                                  Insurance Information HMO:   PPO:      PCP:      IPA:      80/20: yes     OTHER:  PRIMARY: Medicare Part A and B           Policy#:  [id number]     Subscriber: pt Benefits:  Phone #: verified eligibility online via [employer] on 01/13/21     Name: 11/19/2020 Eff. Date: Part A & B effective 11/19/00     Deduct: $1556     Out of Pocket Max: none      Life Max: none  CIR: 100%      SNF: 100% 20 full days, 80% days 21-100 Outpatient: 80%     Co-Pay: 20% Home Health: 100%      Co-Pay: none DME: 80%     Co-Pay: 20% Providers: pt choice   SECONDARY: A[employer] supplement      Policy#: [id number]      Phone#: [phone]                  Financial Counselor:       Phone#:    The "Data Collection Information Summary" for patients in Inpatient Rehabilitation Facilities with attached "Privacy Act Statement-Health Care Records" was provided and verbally reviewed with: Patient   Emergency Contact Information         Contact Information     Name Relation Home Work Mobile    [place] Relative [phone]   [phone]       Current Medical History  Patient Admitting Diagnosis: CVA History of Present Illness:Salote C Lister is a 70 y.o. right-handed female with history of severe mitral regurgitation status post metal mitral valve replacement 2013 maintained on chronic Coumadin., PAF, hypertension, COPD, remote history of MS with neurogenic bladder, diastolic congestive heart failure, IBS.  Per chart review patient lives alone.  Independent with assistive device.  Two-level home 4 steps to entry.  She does not drive.  Neighbors help provide transportation to appointments.   Presented 01/11/2021 with acute onset of right-sided weakness and aphasia.  Cranial CT scan showed no acute intracranial hemorrhage or evidence of acute infarction.  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  No hemodynamically significant stenosis.  Patient did not receive TPA.  MRI showed a wedge-shaped area diffusion on the left paramedian pons suspicious   for acute infarction.  Stable burden of chronic demyelinating disease.  No evidence of active demyelination.  MRI cervical spine no cord signal abnormality or impingement.  Admission chemistries unremarkable except BUN 27 glucose 143, INR 2.2, hemoglobin 14.3, urinalysis positive nitrite.  Echocardiogram with ejection fraction of 65 to 70% no wall motion abnormalities.  Patient presently remains on Coumadin as prior to admission with the addition of low-dose aspirin.  Tolerating a regular consistency diet.  Therapy evaluations completed due to patient's right side weakness and aphasia recommendations of physical medicine rehab consult.   Complete NIHSS TOTAL: 0 Glasgow Coma Scale Score: 15   Past Medical History      Past Medical History:  Diagnosis Date  . Anxiety    . Arthritis      knees  . Breast cancer ([facility]) 1999  . CHF (congestive heart failure) ([place])      Related to severe mitral regurgitation, April, 2013  . COPD (chronic obstructive pulmonary disease) (HCC)      COPD with emphysema.. Assess by pulmonary team in the hospital April, 2013  . Ejection fraction      EF 60%, echo, April, 2013, with severe MR before mitral valve replacement  . Herpes    . Hypothyroidism    . IBS (irritable bowel syndrome)    . Mitral valve regurgitation      Mitral valve replacement April, 2013, Mitral valve prolapse  . Multiple sclerosis ([place])    . Neurogenic bladder    . Osteoporosis    . Ovarian cyst    .  Paroxysmal atrial fibrillation (HCC)      Rapid atrial fibrillation in-hospital, Rapid cardioversion,  before mitral valve surgery  . Pulmonary hypertension ([place])      Echo, April, 2013, before mitral valve surgery  . S/P Maze operation for atrial fibrillation 01/26/2012    Complete biatrial lesion set using cryothermy via right mini thoracotomy  . S/P mitral valve replacement 01/26/2012    39m Sorin [employer] Optiform mechanical prosthesis via right mini thoracotomy  . Warfarin anticoagulation      Mechanical mitral prosthesis, April, 20136      Family History  family history includes Breast cancer in her maternal aunt and maternal grandmother; CAD in her father, mother, and another family member; Heart disease in her father; Hyperlipidemia in her mother; Hypertension in her father and mother.   Prior Rehab/Hospitalizations:  Has the patient had prior rehab or hospitalizations prior to admission? No   Has the patient had major surgery during 100 days prior to admission? No   Current Medications    Current Facility-Administered Medications:  .  acetaminophen (TYLENOL) tablet 650 mg, 650 mg, Oral, Q4H PRN **OR** acetaminophen (TYLENOL) 160 MG/5ML solution 650 mg, 650 mg, Per Tube, Q4H PRN **OR** acetaminophen (TYLENOL) suppository 650 mg, 650 mg, Rectal, Q4H PRN, ZWynetta FinesT, MD .  aspirin EC tablet 81 mg, 81 mg, Oral, Daily, FDennison Mascot PA-C, 81 mg at 01/13/21 1043 .  atorvastatin (LIPITOR) tablet 80 mg, 80 mg, Oral, Daily, FCharlynne Cousins MD, 80 mg at 01/13/21 1043 .  calcium carbonate (OS-CAL - dosed in mg of elemental calcium) tablet 500 mg of elemental calcium, 1 tablet, Oral, Q breakfast, ZWynetta FinesT, MD, 500 mg of elemental calcium at 01/13/21 1043 .  cefTRIAXone (ROCEPHIN) 1 g in sodium chloride 0.9 % 100 mL IVPB, 1 g, Intravenous, Q24H, FCharlynne Cousins MD, Last Rate: 200 mL/hr at 01/13/21 1051, 1 g at 01/13/21 1051 .  cholecalciferol (VITAMIN D3) tablet 6,000  Units, 6,000 Units, Oral, Daily, Lequita Halt, MD, 6,000 Units at 01/13/21 1043 .  lidocaine-prilocaine (EMLA) cream 1 application, 1 application, Topical, PRN, Wynetta Fines T, MD .  meclizine (ANTIVERT) tablet 12.5 mg, 12.5 mg, Oral, TID, Wynetta Fines T, MD, 12.5 mg at 01/13/21 1043 .  metoprolol tartrate (LOPRESSOR) tablet 25 mg, 25 mg, Oral, BID, Wynetta Fines T, MD, 25 mg at 01/13/21 1043 .  senna-docusate (Senokot-S) tablet 1 tablet, 1 tablet, Oral, QHS PRN, Wynetta Fines T, MD .  thyroid (ARMOUR) tablet 90 mg, 90 mg, Oral, Daily, Wynetta Fines T, MD, 90 mg at 01/13/21 1044 .  warfarin (COUMADIN) tablet 5 mg, 5 mg, Oral, ONCE-1600, Pierce, Dwayne A, RPH .  Warfarin - Pharmacist Dosing Inpatient, , Does not apply, q1600, Duanne Limerick, RPH   Patients Current Diet:     Diet Order                      Diet - low sodium heart healthy              Diet Heart Room service appropriate? Yes; Fluid consistency: Thin  Diet effective now                      Precautions / Restrictions Precautions Precautions: Fall Restrictions Weight Bearing Restrictions: No    Has the patient had 2 or more falls or a fall with injury in the past year?No   Prior Activity Level Limited Community (1-2x/wk): Pt was active in the community PTA   Prior Functional Level Prior Function Level of Independence: Independent with assistive device(s) Comments: Does not drive; neighbors take care of pt as needed and assist with transport; lively mobile for fall button. Uses walker   Self Care: Did the patient need help bathing, dressing, using the toilet or eating?  Independent   Indoor Mobility: Did the patient need assistance with walking from room to room (with or without device)? Independent   Stairs: Did the patient need assistance with internal or external stairs (with or without device)? Independent   Functional Cognition: Did the patient need help planning regular tasks such as shopping or remembering to  take medications? Independent   Home Assistive Devices / Equipment Home Assistive Devices/Equipment: Environmental consultant (specify type) Home Equipment: Walker - 2 wheels,Other (comment),Shower seat - built in,Grab bars - toilet,Grab bars - tub/shower,Cane - single point (tray for RW)   Prior Device Use: Indicate devices/aids used by the patient prior to current illness, exacerbation or injury? None of the above   Current Functional Level Cognition   Overall Cognitive Status: Within Functional Limits for tasks assessed Orientation Level: Oriented X4    Extremity Assessment (includes Sensation/Coordination)   Upper Extremity Assessment: Generalized weakness,RUE deficits/detail,LUE deficits/detail RUE Deficits / Details: 4/5 MM grade RUE Coordination: decreased gross motor LUE Deficits / Details: 4/5 MM grade LUE Coordination: decreased gross motor  Lower Extremity Assessment: Defer to PT evaluation,Generalized weakness RLE Deficits / Details: Strength 5/5 LLE Deficits / Details: Strength 5/5     ADLs   Overall ADL's : Needs assistance/impaired Eating/Feeding: Set up,Sitting Grooming: Set up,Sitting Upper Body Bathing: Set up,Sitting Lower Body Bathing: Maximal assistance,Sitting/lateral leans,Sit to/from stand,Cueing for safety Upper Body Dressing : Set up,Sitting Lower Body Dressing: Maximal assistance,Sit to/from stand,Sitting/lateral leans,Cueing for safety Toilet Transfer: Minimal assistance,Stand-pivot,BSC,RW Toilet Transfer Details (indicate cue type and reason): Increased time and BSC swapped with recliner Toileting- Clothing Manipulation and Hygiene:  Maximal assistance,Sitting/lateral lean,Sit to/from stand Functional mobility during ADLs: Min guard,Cueing for safety,Rolling walker General ADL Comments: Pt limited by decreased strength, decreased coordination, adn decreased ability to care for self. Speech appears to have resolved, but pt unsteady with mobility and unable to assist with  LB ADL in standing due to tneed to hold onto RW.     Mobility   Overal bed mobility: Needs Assistance Bed Mobility: Supine to Sit Supine to sit: Min assist General bed mobility comments: Increased time, minA to pull self toward EOB for trunk elevation     Transfers   Overall transfer level: Needs assistance Equipment used: Rolling walker (2 wheeled) Transfers: Sit to/from Stand Sit to Stand: Min guard General transfer comment: minguardA with increased time and momentum used. hand placement assist requried     Ambulation / Gait / Stairs / Wheelchair Mobility   Ambulation/Gait Ambulation/Gait assistance: Min guard Gait Distance (Feet): 6 Feet Assistive device: Rolling walker (2 wheeled) Gait Pattern/deviations: Step-through pattern,Decreased stride length,Trunk flexed,Narrow base of support General Gait Details: Min guard assist for balance, increased trunk flexion, fatigues easily requiring a seated rest break Gait velocity: decreased Gait velocity interpretation: <1.31 ft/sec, indicative of household ambulator     Posture / Balance Balance Overall balance assessment: Needs assistance Sitting-balance support: Feet supported Sitting balance-Leahy Scale: Good Standing balance support: Bilateral upper extremity supported Standing balance-Leahy Scale: Poor Standing balance comment: reliant on external support     Special needs/care consideration None        Previous Home Environment (from acute therapy documentation) Living Arrangements: Alone  Lives With: Alone Available Help at Discharge: Friend(s),Available PRN/intermittently Type of Home: House Home Layout: Two level Alternate Level Stairs-Rails: Can reach both Alternate Level Stairs-Number of Steps: 14 Home Access: Stairs to enter Entrance Stairs-Rails: Can reach both Entrance Stairs-Number of Steps: 4 Bathroom Shower/Tub: Walk-in shower Bathroom Toilet: Standard Home Care Services: No Additional Comments: has a  cat   Discharge Living Setting Plans for Discharge Living Setting: Alone,Patient's home Type of Home at Discharge: House Discharge Home Layout: Two level Alternate Level Stairs-Rails: Can reach both Alternate Level Stairs-Number of Steps: 14 Discharge Home Access: Stairs to enter Entrance Stairs-Rails: Can reach both Entrance Stairs-Number of Steps: 4 Discharge Bathroom Shower/Tub: Walk-in shower Discharge Bathroom Toilet: Standard Discharge Bathroom Accessibility: Yes How Accessible: Accessible via walker Does the patient have any problems obtaining your medications?: No   Social/Family/Support Systems Patient Roles: Other (Comment) Anticipated Caregiver: Neigbor can check on Pt. 1x a day, neice may also be able to come stay with Pt. but has not confirmed Caregiver Availability: Intermittent     Goals Patient/Family Goal for Rehab: PT/OT/SLP mod I Expected length of stay: 5-7 days Pt/Family Agrees to Admission and willing to participate: Yes Program Orientation Provided & Reviewed with Pt/Caregiver Including Roles  & Responsibilities: Yes     Decrease burden of Care through IP rehab admission: Specialzed equipment needs, Decrease number of caregivers, Bowel and bladder program and Patient/family education     Possible need for SNF placement upon discharge:not anticipated     Patient Condition: I have reviewed medical records from [facility], spoken with CSW, and patient. I met with patient at the bedside for inpatient rehabilitation assessment.  Patient will benefit from ongoing PT and OT, can actively participate in 3 hours of therapy a day 5 days of the week, and can make measurable gains during the admission.  Patient will also benefit from the coordinated team approach during an Inpatient   Acute Rehabilitation admission.  The patient will receive intensive therapy as well as Rehabilitation physician, nursing, social worker, and care management interventions.  Due to  bladder management, safety, disease management, medication administration and patient education the patient requires 24 hour a day rehabilitation nursing.  The patient is currently Min A-Min G with mobility and Max A-Set up with basic ADLs.  Discharge setting and therapy post discharge at home with home health is anticipated.  Patient has agreed to participate in the Acute Inpatient Rehabilitation Program and will admit today.   Preadmission Screen Completed By: Laura B Staley with day of admission updates by  Lauren P Graves Madden, CCC-SLP, 01/13/2021 1:12 PM ______________________________________________________________________   Discussed status with Dr. Kirsteins on 01/13/21  at 1:12 PM and received approval for admission today.   Admission Coordinator: Laura B Staley with day of admission updates by Lauren P Graves Madden, time 1:12 PM  /Date 01/13/21          

## 2021-01-13 NOTE — Evaluation (Signed)
Speech Language Pathology Evaluation Patient Details Name: [name] [name] MRN: [id number] DOB: 1951/09/01 Today's Date: 01/13/2021 Time: 4481-8563 SLP Time Calculation (min) (ACUTE ONLY): 26 min  Problem List:  Patient Active Problem List   Diagnosis Date Noted  . CVA (cerebral vascular accident) ([place]) 01/11/2021  . Right sided weakness   . PVCs (premature ventricular contractions) 03/30/2020  . Osteoporosis 07/05/2019  . Nonischemic cardiomyopathy ([place]) 07/20/2018  . Chest pain 07/20/2018  . Scoliosis 11/20/2016  . Long term current use of anticoagulant 09/18/2016  . Compression fracture of lumbar spine, non-traumatic, sequela 08/11/2016  . Multiple falls 08/11/2016  . At high risk for injury related to fall 08/11/2016  . Encounter for therapeutic drug monitoring 11/27/2013  . Hypothyroidism   . CHF (congestive heart failure) ([place])   . Paroxysmal atrial fibrillation (HCC)   . Arthritis   . Neurogenic bladder   . Warfarin anticoagulation   . First degree heart block 01/29/2012  . 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 S/P Maze operation for atrial fibrillation 01/26/2012  . Anxiety 01/18/2012  . Multiple sclerosis ([place]) 01/19/2007   Past Medical History:  Past Medical History:  Diagnosis Date  . Anxiety   . Arthritis    knees  . Breast cancer ([place]) 1999  . CHF (congestive heart failure) ([place])    Related to severe mitral regurgitation, April, 2013  . 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 Assess by pulmonary team in the hospital April, 2013  . Ejection fraction    EF 60%, echo, April, 2013, with severe MR before mitral valve replacement  . Herpes   . Hypothyroidism   . IBS (irritable bowel syndrome)   . Mitral valve regurgitation    Mitral valve replacement April, 2013, Mitral valve prolapse  . Multiple sclerosis ([place])   . Neurogenic bladder   . Osteoporosis   . Ovarian cyst   . Paroxysmal atrial fibrillation (HCC)     Rapid atrial fibrillation in-hospital, Rapid cardioversion,  before mitral valve surgery  . Pulmonary hypertension ([place])    Echo, April, 2013, before mitral valve surgery  . S/P Maze operation for atrial fibrillation 01/26/2012   Complete biatrial lesion set using cryothermy via right mini thoracotomy  . S/P mitral valve replacement 01/26/2012   72mm Sorin [employer] Optiform mechanical prosthesis via right mini thoracotomy  . Warfarin anticoagulation    Mechanical mitral prosthesis, April, 20136   Past Surgical History:  Past Surgical History:  Procedure Laterality Date  . BREAST LUMPECTOMY Right 1999   with sent.node, and axillary dissection (20)  . CHEST TUBE INSERTION  01/26/2012   Procedure: CHEST TUBE INSERTION;  Surgeon: Rexene Alberts, MD;  Location: [place];  Service: Open Heart Surgery;  Laterality: Left;  . COLONOSCOPY  2010   "normal"  . CYSTOSCOPY  1992  . LAPAROSCOPIC OVARIAN CYSTECTOMY  1978   urethral stricture repair  . LEFT AND RIGHT HEART CATHETERIZATION WITH CORONARY ANGIOGRAM N/A 01/20/2012   Procedure: LEFT AND RIGHT HEART CATHETERIZATION WITH CORONARY ANGIOGRAM;  Surgeon: Burnell Blanks, MD;  Location: [facility] CATH LAB;  Service: Cardiovascular;  Laterality: N/A;  . LYMPHADENECTOMY    . MAZE  01/26/2012   Procedure: MAZE;  Surgeon: Rexene Alberts, MD;  Location: [place];  Service: Open Heart Surgery;  Laterality: N/A;  . MITRAL VALVE REPLACEMENT  01/26/2012   Procedure: MINIMALLY INVASIVE MITRAL VALVE (MV) REPLACEMENT;  Surgeon: Rexene Alberts, MD;  Location: [place];  Service: Open  Heart Surgery;  Laterality: Right;  . TEE WITHOUT CARDIOVERSION  01/19/2012   Procedure: TRANSESOPHAGEAL ECHOCARDIOGRAM (TEE);  Surgeon: Peter M Martinique, MD;  Location: [facility] ENDOSCOPY;  Service: Cardiovascular;  Laterality: N/A;  . TONSILLECTOMY  1970  . [place]  . WRIST SURGERY Right 2012   HPI:  Pt is a 70 y.o. F admitted 3/26 with right sided weakness and dizziness. MRI  showing wedge shaped diffusion on the left paramedian pons suspicious for acute infarct. Significant PMH: MS, mitral regurgitation s/p metallic mitral valve replacement, paroxysmal atrial fibrillation, essential hypertension, COPD.   Assessment / Plan / Recommendation Clinical Impression  Pt presents with resolved speech symptoms.  No further dysarthria. Speech is clear and fluent.  Expressive and receptive language are WNL per assessment. Cognition, oral reading and reading comprehension WNL. There are no deficits identified and no SLP f/u is required. Our service will sign off.    SLP Assessment  SLP Recommendation/Assessment: Patient does not need any further Speech Lanaguage Pathology Services SLP Visit Diagnosis: Cognitive communication deficit (R41.841)    Follow Up Recommendations  None    Frequency and Duration     n/a      SLP Evaluation Cognition  Overall Cognitive Status: Within Functional Limits for tasks assessed Orientation Level: Oriented X4       Comprehension  Auditory Comprehension Overall Auditory Comprehension: Appears within functional limits for tasks assessed Yes/No Questions: Within Functional Limits Commands: Within Functional Limits Conversation: Complex Visual Recognition/Discrimination Discrimination: Within Function Limits Reading Comprehension Reading Status: Within funtional limits    Expression Expression Primary Mode of Expression: Verbal Verbal Expression Overall Verbal Expression: Appears within functional limits for tasks assessed Written Expression Dominant Hand: Right Written Expression: Within Functional Limits   Oral / Motor  Oral Motor/Sensory Function Overall Oral Motor/Sensory Function: Within functional limits Motor Speech Overall Motor Speech: Appears within functional limits for tasks assessed   GO                    Juan Quam Laurice 01/13/2021, 10:42 AM  [name] L. Tivis Ringer, [place] Office number [phone] Pager [phone]

## 2021-01-13 NOTE — Progress Notes (Signed)
Inpatient Rehab Admissions Coordinator:  There is a bed available in CIR for pt to admit today. Dr. Aileen Fass is aware and in agreement.  TOC, NSG, and pt made aware.    Gayland Curry, [place], [place] Admissions Coordinator [phone]

## 2021-01-13 NOTE — Progress Notes (Signed)
Report given to nurse in inpatient rehab, patient will transfer as soon as the room is clean. All belongings at bedside, telemetry removed.

## 2021-01-13 NOTE — Progress Notes (Signed)
Neurology Progress Note  Patient ID: [name] [name] is a 70 y.o. with PMHx of  has a past medical history of Anxiety, Arthritis, Breast cancer ([place]) (1999), CHF (congestive heart failure) ([place]), COPD (chronic obstructive pulmonary disease) ([place]), Ejection fraction, Herpes, Hypothyroidism, IBS (irritable bowel syndrome), Mitral valve regurgitation, Multiple sclerosis ([place]), Neurogenic bladder, Osteoporosis, Ovarian cyst, Paroxysmal atrial fibrillation ([place]), Pulmonary hypertension ([place]), S/P Maze operation for atrial fibrillation (01/26/2012), S/P mitral valve replacement (01/26/2012), and Warfarin anticoagulation.  Initially consulted for: right sided weakness and acute nonverbal state.    Major interval events:  NIHSS 20 on admission   Subjective: .Patient is sitting up in bed.  She is feeling fine and has no complaints.  She has a PEG in inpatient rehab later today.  Vital signs stable.  Neurological exam unchanged.   Exam: Vitals:   01/13/21 0715 01/13/21 1116  BP: (!) 144/73 (!) 155/87  Pulse: 74 73  Resp: 18 18  Temp: 98 F (36.7 C) 97.8 F (36.6 C)  SpO2: 96% 99%   Gen: In bed, comfortable  Resp: non-labored breathing, no grossly audible wheezing Cardiac: Perfusing extremities well  Abd: soft, nt  Neurological Examination Mental Status: awake and alert and follows all commands without difficulty, including  protruding tongue , touching her nose to command . Answers all questions without difficulty. Able to give a clear and coherent history.    Cranial Nerves: II: pupils are about 45mm and briskly reactive.   III,IV, VI: At rest left eye is at midline, right eye has minimal extropia. But otherwise intact. No nystagmus. Eyes with fully intact visual fields. V,VII: F  Smile is symmetric without any facial drooping noted. VIII: Hearing intact to voice IX,X:  intact.  XI: Head is midline XII: Protrudes tongue without deviation.   Motor: Right :  Upper extremity   4+/5                             Left:     Upper extremity   5/5             Lower extremity   4+/5                                         Lower extremity   4+/5 Bulk normal throughout Sensory: Intact to light touch throughout at time of this exam. Deep Tendon Reflexes: 2+ and symmetric biceps, 3+ and symmetric patellae Plantars: Right: upgoing                                Left: upgoing Cerebellar: FTN intact Gait: Unable to assess   Pertinent Labs:  Results for CHINITA, SCHIMPF (MRN [id number]) as of 01/12/2021 13:10  Ref. Range 01/12/2021 02:07  Total CHOL/HDL Ratio Latest Units: RATIO 3.3  Cholesterol Latest Ref Range: 0 - 200 mg/dL 204 (H)  HDL Cholesterol Latest Ref Range: >40 mg/dL 62  LDL (calc) Latest Ref Range: 0 - 99 mg/dL 131 (H)  Triglycerides Latest Ref Range: <150 mg/dL 53  VLDL Latest Ref Range: 0 - 40 mg/dL 11  Results for REIGN, DZIUBA (MRN [id number]) as of 01/12/2021 13:10  Ref. Range 01/12/2021 02:07  Hemoglobin A1C Latest Ref Range: 4.8 - 5.6 % 5.5   MRI  Brain without contrast 01/11/21  There is a wedge-shaped area of diffusion hyperintensity at the left paramedian pons without corresponding T2 hyperintensity suspicious for acute perforator infarct.   Punctate focus of diffusion hyperintensity in the posterior left cerebellum is without correlate on coronal DWI.  Echocardiogram 04/2020 Left ventricular ejection fraction, by estimation, is 65 to 70%.  Left Atrium: Left atrial size was normal in size.  IAS/Shunts: No atrial level shunt detected by color flow Doppler.   Impression:  70 year old female with history outlined above who presented with right sided weakness as well as acute nonverbal state. 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   Recommendations: Discontinue aspirin as I do not see any added benefit to warfarin risk for bleeding.   Continue with coumadin as prehospital/ in-hospital management per primary team. Transfer to inpatient rehab  when bed available.  Discussed with patient and rehab coordinator.  Greater than 50% time during this 25-minute visit was spent on counseling and coordination of care about her stroke and discussion with care team and answering questions. Antony Contras, MD

## 2021-01-13 NOTE — H&P (Signed)
Physical Medicine and Rehabilitation Admission H&P        Chief Complaint  Patient presents with  . 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  Per chart review lives alone independent with assistive device.  Two-level home 4 steps to entry.  She does not drive.  Neighbors help provide transportation to appointments.  Presented 01/11/2021 with acute onset of right side weakness and aphasia.  Cranial CT scan showed no acute intracranial hemorrhage or evidence of acute infarction.  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  No hemodynamically significant stenosis.  Patient did not receive TPA.  MRI showed a wedge-shaped area of diffusion on the left paramedian pons suspicious for acute infarction.  Stable burden of chronic demyelinating disease.  No evidence of active demyelination.  MRI cervical spine no cord signal abnormality or impingement.  Admission chemistries unremarkable except BUN 27 glucose 143 INR 2.2 hemoglobin 14.3, urinalysis positive nitrite.  Echocardiogram with ejection fraction of 65 to 70% no wall motion abnormalities.  Patient presently remains on Coumadin as prior to admission with the addition of low-dose aspirin.  Presently maintained on Rocephin for UTI with urine culture pending.  Tolerating a regular consistency diet.  Therapy evaluations completed due to patient's right side weakness and aphasia patient was admitted for a comprehensive rehab program   Review of Systems  Constitutional: Negative for chills and fever.  HENT: Negative for hearing loss.   Eyes: Negative for blurred vision and  double vision.  Respiratory: Negative for cough.        Shortness of breath with exertion  Cardiovascular: Positive for palpitations and leg swelling.  Gastrointestinal: Positive for constipation. Negative for heartburn, nausea and vomiting.  Genitourinary: Positive for urgency. Negative for dysuria and hematuria.  Musculoskeletal: Positive for falls, joint pain and myalgias.  Skin: Negative for rash.  Neurological: Positive for dizziness, speech change and weakness.  Psychiatric/Behavioral: The patient has insomnia.        Anxiety  All other systems reviewed and are negative.       Past Medical History:  Diagnosis Date  . Anxiety    . Arthritis      knees  . Breast cancer ([place]) 1999  . CHF (congestive heart failure) ([place])      Related to severe mitral regurgitation, April, 2013  . COPD (chronic obstructive pulmonary disease) (HCC)      COPD with emphysema.. Assess by pulmonary team in the hospital April, 2013  . Ejection fraction      EF 60%, echo, April, 2013, with severe MR before mitral valve replacement  . Herpes    . Hypothyroidism    . IBS (irritable bowel syndrome)    . Mitral valve regurgitation      Mitral valve replacement April, 2013, Mitral valve prolapse  . Multiple sclerosis ([place])    . Neurogenic bladder    . Osteoporosis    . Ovarian cyst    . Paroxysmal atrial fibrillation (HCC)      Rapid atrial fibrillation in-hospital, Rapid cardioversion,  before mitral valve surgery  . Pulmonary hypertension ([place])      Echo, April, 2013, before mitral valve surgery  . S/P Maze operation for atrial  fibrillation 01/26/2012    Complete biatrial lesion set using cryothermy via right mini thoracotomy  . S/P mitral valve replacement 01/26/2012    60mm Sorin [employer] Optiform mechanical prosthesis via right mini thoracotomy  . Warfarin anticoagulation      Mechanical mitral prosthesis, April, 20136         Past Surgical History:  Procedure Laterality Date  . BREAST  LUMPECTOMY Right 1999    with sent.node, and axillary dissection (20)  . CHEST TUBE INSERTION   01/26/2012    Procedure: CHEST TUBE INSERTION;  Surgeon: Rexene Alberts, MD;  Location: [place];  Service: Open Heart Surgery;  Laterality: Left;  . COLONOSCOPY   2010    "normal"  . CYSTOSCOPY   1992  . LAPAROSCOPIC OVARIAN CYSTECTOMY   1978    urethral stricture repair  . LEFT AND RIGHT HEART CATHETERIZATION WITH CORONARY ANGIOGRAM N/A 01/20/2012    Procedure: LEFT AND RIGHT HEART CATHETERIZATION WITH CORONARY ANGIOGRAM;  Surgeon: Burnell Blanks, MD;  Location: [facility] CATH LAB;  Service: Cardiovascular;  Laterality: N/A;  . LYMPHADENECTOMY      . MAZE   01/26/2012    Procedure: MAZE;  Surgeon: Rexene Alberts, MD;  Location: [place];  Service: Open Heart Surgery;  Laterality: N/A;  . MITRAL VALVE REPLACEMENT   01/26/2012    Procedure: MINIMALLY INVASIVE MITRAL VALVE (MV) REPLACEMENT;  Surgeon: Rexene Alberts, MD;  Location: [facility] [place];  Service: Open Heart Surgery;  Laterality: Right;  . TEE WITHOUT CARDIOVERSION   01/19/2012    Procedure: TRANSESOPHAGEAL ECHOCARDIOGRAM (TEE);  Surgeon: Peter M Martinique, MD;  Location: [facility] ENDOSCOPY;  Service: Cardiovascular;  Laterality: N/A;  . TONSILLECTOMY   1970  . [place]  . WRIST SURGERY Right 2012         Family History  Problem Relation Age of Onset  . Heart disease Father          cardiac arrest   . CAD Father    . Hypertension Father    . CAD Mother          5 stents and numerous bypass surgery  . Hypertension Mother    . Hyperlipidemia Mother    . CAD Other    . Breast cancer Maternal Grandmother    . Breast cancer Maternal Aunt      Social History:  reports that she has never smoked. She has never used smokeless tobacco. She reports that she does not drink alcohol and does not use drugs. Allergies:       Allergies  Allergen Reactions  . Erythromycin Nausea And Vomiting  . Valsartan Other (See Comments)      Pt reports  caused her depression          Medications Prior to Admission  Medication Sig Dispense Refill  . ARMOUR THYROID 90 MG tablet Take 90 mg by mouth daily.      . Biotin 5000 MCG CAPS Take 10,000 mcg by mouth in the morning and at bedtime.      . Black Cohosh 40 MG CAPS Take 40 mg by mouth every evening.      . Bromelains (BROMELAIN PO) Take 1 capsule by mouth 2 (two) times daily.      . calcium carbonate (OSCAL) 1500 (600 Ca) MG TABS tablet Take 1,500 mg by mouth daily. Takes 1 time daily      . Cholecalciferol (VITAMIN D3) 2000 units capsule Take 4,000 Units by mouth  daily.      . Coenzyme Q10 (CO Q-10) 100 MG CAPS Take 1 capsule by mouth daily.       . Cranberry 500 MG CAPS Take 1 capsule by mouth daily.      Marland Kitchen L-THEANINE PO Take 1 capsule by mouth daily.       Marland Kitchen lisinopril (ZESTRIL) 5 MG tablet Take 1 tablet (5 mg total) by mouth daily. 90 tablet 1  . Lysine 500 MG CAPS Take 1 capsule by mouth daily.      . Magnesium Citrate 200 MG TABS Take 200 mg by mouth daily.      . metoprolol tartrate (LOPRESSOR) 25 MG tablet TAKE 1 TAB (25 MG) PO TWICE DAILY - pt must make appt with provider for further refills - 1st attempt (Patient taking differently: Take 25 mg by mouth 2 (two) times daily.) 180 tablet 3  . MILK THISTLE PO Take 1 capsule by mouth daily.      . Misc Natural Products (GLUCOSAMINE CHOND COMPLEX/MSM PO) Take 1 tablet by mouth in the morning and at bedtime. Strength of dose Glucosamine 1500mg  /Chodroitron 1000mg / MSM 500mg  takes one twice daily      . Olive Leaf 500 MG CAPS Take 1 capsule by mouth 2 (two) times daily.       . Petasin (PETADOLEX PO) Take 1 tablet by mouth 2 (two) times daily.      . Probiotic Product (PROBIOTIC DAILY PO) Take 1 capsule by mouth daily. Take 1 capsule once a day      . PROGESTERONE MICRONIZED PO Take 100 mg by mouth daily.       . Pumpkin Seed 500 MG TABS Take 1 tablet by mouth 2 (two) times daily.      . TURMERIC PO Take 1 capsule by mouth daily.      .  vitamin A 10000 UNIT capsule Take 10,000 Units by mouth daily.      . vitamin B-12 (CYANOCOBALAMIN) 1000 MCG tablet Take 1,000 mcg by mouth daily. Taking sublingal      . vitamin E 400 UNIT capsule Take 400 Units by mouth daily.      Marland Kitchen warfarin (COUMADIN) 5 MG tablet TAKE 1 TABLET DAILY EXCEPT 1.5 TABLETS ON MONDAY AND THURSDAY  BY MOUTH EVERY DAY OR AS DIRECTED BY COUMADIN CLINIC (Patient taking differently: Take 5-7.5 mg by mouth as directed. Take 1.5 tablet (7.5 mg) on (Mon,Thurs) & Take 1 tablet (5 mg) All Other Days As Directed By Coumadin Clinic) 110 tablet 0  . amoxicillin (AMOXIL) 500 MG capsule Take 2,000 mg by mouth once.   0  . lactose free nutrition (BOOST PLUS) LIQD Take 237 mLs by mouth daily.       Marland Kitchen lidocaine-prilocaine (EMLA) cream Apply 1 application topically as needed (per pt this is for use before blood draws).    0      Drug Regimen Review Drug regimen was reviewed and remains appropriate with no significant issues identified   Home: Home Living Family/patient expects to be discharged to:: Private residence Living Arrangements: Alone Available Help at Discharge: Friend(s),Available PRN/intermittently Type of Home: House Home Access: Stairs to enter [employer] of Steps: 4 Entrance Stairs-Rails: Can reach both Home Layout: Two level Alternate Level Stairs-Number of Steps: 14 Alternate Level Stairs-Rails: Can reach both Bathroom Shower/Tub: Multimedia programmer: Standard Home Equipment: Environmental consultant - 2 wheels,Other (comment),Shower seat - built in,Grab bars - toilet,Grab bars - tub/shower,Cane - single point (tray for RW)  Additional Comments: has a cat   Functional History: Prior Function Level of Independence: Independent with assistive device(s) Comments: Does not drive; neighbors take care of pt as needed and assist with transport; lively mobile for fall button. Uses walker   Functional Status:  Mobility: Bed Mobility Overal bed mobility:  Needs Assistance Bed Mobility: Supine to Sit Supine to sit: Min assist General bed mobility comments: Increased time, minA to pull self toward EOB for trunk elevation Transfers Overall transfer level: Needs assistance Equipment used: Rolling walker (2 wheeled) Transfers: Sit to/from Stand Sit to Stand: Min guard General transfer comment: [email or web address] with increased time and momentum used. hand placement assist requried Ambulation/Gait Ambulation/Gait assistance: Min guard Gait Distance (Feet): 6 Feet Assistive device: Rolling walker (2 wheeled) Gait Pattern/deviations: Step-through pattern,Decreased stride length,Trunk flexed,Narrow base of support General Gait Details: Min guard assist for balance, increased trunk flexion, fatigues easily requiring a seated rest break Gait velocity: decreased Gait velocity interpretation: <1.31 ft/sec, indicative of household ambulator   ADL: ADL Overall ADL's : Needs assistance/impaired Eating/Feeding: Set up,Sitting Grooming: Set up,Sitting Upper Body Bathing: Set up,Sitting Lower Body Bathing: Maximal assistance,Sitting/lateral leans,Sit to/from stand,Cueing for safety Upper Body Dressing : Set up,Sitting Lower Body Dressing: Maximal assistance,Sit to/from stand,Sitting/lateral leans,Cueing for safety Toilet Transfer: Minimal assistance,Stand-pivot,BSC,RW Toilet Transfer Details (indicate cue type and reason): Increased time and BSC swapped with recliner Toileting- Clothing Manipulation and Hygiene: Maximal assistance,Sitting/lateral lean,Sit to/from stand Functional mobility during ADLs: Min guard,Cueing for safety,Rolling walker General ADL Comments: Pt limited by decreased strength, decreased coordination, adn decreased ability to care for self. Speech appears to have resolved, but pt unsteady with mobility and unable to assist with LB ADL in standing due to tneed to hold onto RW.   Cognition: Cognition Overall Cognitive Status: Within  Functional Limits for tasks assessed Orientation Level: Oriented X4 Cognition Arousal/Alertness: Awake/alert Behavior During Therapy: WFL for tasks assessed/performed Overall Cognitive Status: Within Functional Limits for tasks assessed   Physical Exam: Blood pressure (!) 144/73, pulse 74, temperature 98 F (36.7 C), temperature source Oral, resp. rate 18, height 5\' 7"  (1.702 m), weight 69.4 kg, SpO2 96 %. Physical Exam Neurological:     Comments: Patient is alert in no acute distress.  Makes eye contact with examiner.  Provides name and age.  Some mild delay in processing with low tone.  She was able to speak in short phrases.     General: No acute distress Mood and affect are appropriate Heart: irreg irreg rate and rhythm no rubs murmurs or extra sounds Lungs: Clear to auscultation, breathing unlabored, no rales or wheezes Abdomen: Positive bowel sounds, soft nontender to palpation, nondistended Extremities: No clubbing, cyanosis, or edema Skin: No evidence of breakdown, no evidence of rash Neurologic: Cranial nerves II through XII intact, motor strength is 5/5 in left and 4/5 Right  deltoid, bicep, tricep, grip, hip flexor, knee extensors, ankle dorsiflexor and plantar flexor Sensory exam normal sensation to light touch in bilateral upper and lower extremities Cerebellar exam normal finger to nose to finger as well as heel to shin in bilateral upper and lower extremities Musculoskeletal: Full range of motion in all 4 extremities. No joint swelling    Lab Results Last 48 Hours        Results for orders placed or performed during the hospital encounter of 01/11/21 (from the past 48 hour(s))  CBG monitoring, ED     Status: None    Collection Time: 01/11/21  2:21 PM  Result Value Ref Range  Glucose-Capillary 89 70 - 99 mg/dL      Comment: Glucose reference range applies only to samples taken after fasting for at least 8 hours.  I-stat chem 8, ED     Status: Abnormal    Collection  Time: 01/11/21  2:27 PM  Result Value Ref Range    Sodium 138 135 - 145 mmol/L    Potassium 3.5 3.5 - 5.1 mmol/L    Chloride 102 98 - 111 mmol/L    BUN 27 (H) 8 - 23 mg/dL    Creatinine, Ser 0.70 0.44 - 1.00 mg/dL    Glucose, Bld 143 (H) 70 - 99 mg/dL      Comment: Glucose reference range applies only to samples taken after fasting for at least 8 hours.    Calcium, Ion 1.04 (L) 1.15 - 1.40 mmol/L    TCO2 25 22 - 32 mmol/L    Hemoglobin 13.9 12.0 - 15.0 g/dL    HCT 41.0 36.0 - 46.0 %  Protime-INR     Status: Abnormal    Collection Time: 01/11/21  2:40 PM  Result Value Ref Range    Prothrombin Time 23.8 (H) 11.4 - 15.2 seconds    INR 2.2 (H) 0.8 - 1.2      Comment: (NOTE) INR goal varies based on device and disease states. Performed at [facility], [place] [address] [place], [place] [address]    APTT     Status: None    Collection Time: 01/11/21  2:40 PM  Result Value Ref Range    aPTT 26 24 - 36 seconds      Comment: Performed at [facility] [address] [place], [place] [address]  CBC     Status: None    Collection Time: 01/11/21  2:40 PM  Result Value Ref Range    WBC 6.5 4.0 - 10.5 K/uL    RBC 4.50 3.87 - 5.11 MIL/uL    Hemoglobin 14.3 12.0 - 15.0 g/dL    HCT 42.9 36.0 - 46.0 %    MCV 95.3 80.0 - 100.0 fL    MCH 31.8 26.0 - 34.0 pg    MCHC 33.3 30.0 - 36.0 g/dL    RDW 13.1 11.5 - 15.5 %    Platelets 263 150 - 400 K/uL    nRBC 0.0 0.0 - 0.2 %      Comment: Performed at [facility], [place] [address] [place], [place] [address]  Differential     Status: None    Collection Time: 01/11/21  2:40 PM  Result Value Ref Range    Neutrophils Relative % 53 %    Neutro Abs 3.5 1.7 - 7.7 K/uL    Lymphocytes Relative 35 %    Lymphs Abs 2.2 0.7 - 4.0 K/uL    Monocytes Relative 10 %    Monocytes Absolute 0.6 0.1 - 1.0 K/uL    Eosinophils Relative 1 %    Eosinophils Absolute 0.1 0.0 - 0.5 K/uL    Basophils Relative 1 %    Basophils Absolute 0.0 0.0 - 0.1  K/uL    Immature Granulocytes 0 %    Abs Immature Granulocytes 0.01 0.00 - 0.07 K/uL      Comment: Performed at [facility], [place] [address], [place], [place] [address]  Comprehensive metabolic panel     Status: Abnormal    Collection Time: 01/11/21  2:40 PM  Result Value Ref Range    Sodium 137 135 -  145 mmol/L    Potassium 3.6 3.5 - 5.1 mmol/L    Chloride 101 98 - 111 mmol/L    CO2 25 22 - 32 mmol/L    Glucose, Bld 146 (H) 70 - 99 mg/dL      Comment: Glucose reference range applies only to samples taken after fasting for at least 8 hours.    BUN 27 (H) 8 - 23 mg/dL    Creatinine, Ser 0.78 0.44 - 1.00 mg/dL    Calcium 9.1 8.9 - 10.3 mg/dL    Total Protein 6.8 6.5 - 8.1 g/dL    Albumin 3.3 (L) 3.5 - 5.0 g/dL    AST 25 15 - 41 U/L    ALT 20 0 - 44 U/L    Alkaline Phosphatase 72 38 - 126 U/L    Total Bilirubin 0.8 0.3 - 1.2 mg/dL    GFR, Estimated >60 >60 mL/min      Comment: (NOTE) Calculated using the CKD-EPI Creatinine Equation (2021)      Anion gap 11 5 - 15      Comment: Performed at [facility] [address] [place], [name] [address]  Resp Panel by RT-PCR (Flu A&B, Covid) Nasopharyngeal Swab     Status: None    Collection Time: 01/11/21  3:21 PM    Specimen: Nasopharyngeal Swab; Nasopharyngeal(NP) swabs in vial transport medium  Result Value Ref Range    SARS Coronavirus 2 by RT PCR NEGATIVE NEGATIVE      Comment: (NOTE) SARS-CoV-2 target nucleic acids are NOT DETECTED.   The SARS-CoV-2 RNA is generally detectable in upper respiratory specimens during the acute phase of infection. The lowest concentration of SARS-CoV-2 viral copies this assay can detect is 138 copies/mL. A negative result does not preclude SARS-Cov-2 infection and should not be used as the sole basis for treatment or other patient management decisions. A negative result may occur with  improper specimen collection/handling, submission of specimen other than nasopharyngeal swab, presence  of viral mutation(s) within the areas targeted by this assay, and inadequate number of viral copies(<138 copies/mL). A negative result must be combined with clinical observations, patient history, and epidemiological information. The expected result is Negative.   Fact Sheet for Patients:  EntrepreneurPulse.com.au   Fact Sheet for Healthcare Providers:  IncredibleEmployment.be   This test is no t yet approved or cleared by the [place] FDA and  has been authorized for detection and/or diagnosis of SARS-CoV-2 by FDA under an Emergency Use Authorization (EUA). This EUA will remain  in effect (meaning this test can be used) for the duration of the COVID-19 declaration under Section 564(b)(1) of the Act, 21 U.S.C.section 360bbb-3(b)(1), unless the authorization is terminated  or revoked sooner.           Influenza A by PCR NEGATIVE NEGATIVE    Influenza B by PCR NEGATIVE NEGATIVE      Comment: (NOTE) The Xpert Xpress SARS-CoV-2/FLU/RSV plus assay is intended as an aid in the diagnosis of influenza from Nasopharyngeal swab specimens and should not be used as a sole basis for treatment. Nasal washings and aspirates are unacceptable for Xpert Xpress SARS-CoV-2/FLU/RSV testing.   Fact Sheet for Patients: EntrepreneurPulse.com.au   Fact Sheet for Healthcare Providers: IncredibleEmployment.be   This test is not yet approved or cleared by the [place] FDA and has been authorized for detection and/or diagnosis of SARS-CoV-2 by FDA under an Emergency Use Authorization (EUA). This EUA will remain in effect (meaning this test can  be used) for the duration of the COVID-19 declaration under Section 564(b)(1) of the Act, 21 U.S.C. section 360bbb-3(b)(1), unless the authorization is terminated or revoked.   Performed at [facility], [place] [address], [place], [place] [address]    Urinalysis, Routine w  reflex microscopic     Status: Abnormal    Collection Time: 01/11/21  7:04 PM  Result Value Ref Range    Color, Urine YELLOW YELLOW    APPearance HAZY (A) CLEAR    Specific Gravity, Urine 1.042 (H) 1.005 - 1.030    pH 6.0 5.0 - 8.0    Glucose, UA NEGATIVE NEGATIVE mg/dL    Hgb urine dipstick SMALL (A) NEGATIVE    Bilirubin Urine NEGATIVE NEGATIVE    Ketones, ur 20 (A) NEGATIVE mg/dL    Protein, ur NEGATIVE NEGATIVE mg/dL    Nitrite POSITIVE (A) NEGATIVE    Leukocytes,Ua TRACE (A) NEGATIVE    RBC / HPF 0-5 0 - 5 RBC/hpf    WBC, UA 6-10 0 - 5 WBC/hpf    Bacteria, UA RARE (A) NONE SEEN    Squamous Epithelial / LPF 0-5 0 - 5      Comment: Performed at [facility], [address] [place], [place] [address]  TSH     Status: None    Collection Time: 01/11/21  8:36 PM  Result Value Ref Range    TSH 0.371 0.350 - 4.500 uIU/mL      Comment: Performed by a 3rd Generation assay with a functional sensitivity of <=0.01 uIU/mL. Performed at [facility], [place] [address] [place], [place] [address]    Hemoglobin A1c     Status: None    Collection Time: 01/12/21  2:07 AM  Result Value Ref Range    Hgb A1c MFr Bld 5.5 4.8 - 5.6 %      Comment: (NOTE) Pre diabetes:          5.7%-6.4%   Diabetes:              >6.4%   Glycemic control for   <7.0% adults with diabetes      Mean Plasma Glucose 111.15 mg/dL      Comment: Performed at [facility] [address] [place],  [address]  Lipid panel     Status: Abnormal    Collection Time: 01/12/21  2:07 AM  Result Value Ref Range    Cholesterol 204 (H) 0 - 200 mg/dL    Triglycerides 53 <150 mg/dL    HDL 62 >40 mg/dL    Total CHOL/HDL Ratio 3.3 RATIO    VLDL 11 0 - 40 mg/dL    LDL Cholesterol 131 (H) 0 - 99 mg/dL      Comment:        Total Cholesterol/HDL:CHD Risk Coronary Heart Disease Risk Table                     Men   Women  1/2 Average Risk   3.4   3.3  Average Risk       5.0   4.4  2 X Average Risk   9.6    7.1  3 X Average Risk  23.4   11.0        Use the calculated Patient Ratio above and the CHD Risk Table to determine the patient's CHD Risk.        ATP III CLASSIFICATION (LDL):  <100  mg/dL   Optimal  100-129  mg/dL   Near or Above                    Optimal  130-159  mg/dL   Borderline  160-189  mg/dL   High  >190     mg/dL   Very High Performed at [facility] [address], [place], [place] [address]    Protime-INR     Status: Abnormal    Collection Time: 01/12/21  2:07 AM  Result Value Ref Range    Prothrombin Time 24.9 (H) 11.4 - 15.2 seconds    INR 2.3 (H) 0.8 - 1.2      Comment: (NOTE) INR goal varies based on device and disease states. Performed at [facility], [place] [address] [place], [place] [address]    Protime-INR     Status: Abnormal    Collection Time: 01/13/21  5:02 AM  Result Value Ref Range    Prothrombin Time 31.6 (H) 11.4 - 15.2 seconds    INR 3.2 (H) 0.8 - 1.2      Comment: (NOTE) INR goal varies based on device and disease states. Performed at [facility], [place] [address], [place], [place] [address]    CBC     Status: None    Collection Time: 01/13/21  5:02 AM  Result Value Ref Range    WBC 6.0 4.0 - 10.5 K/uL    RBC 3.93 3.87 - 5.11 MIL/uL    Hemoglobin 12.6 12.0 - 15.0 g/dL    HCT 36.8 36.0 - 46.0 %    MCV 93.6 80.0 - 100.0 fL    MCH 32.1 26.0 - 34.0 pg    MCHC 34.2 30.0 - 36.0 g/dL    RDW 13.5 11.5 - 15.5 %    Platelets 211 150 - 400 K/uL    nRBC 0.0 0.0 - 0.2 %      Comment: Performed at [facility], [place] [address], [place], [place] [address]       Imaging Results (Last 48 hours)  CT ANGIO HEAD W OR WO CONTRAST   Result Date: 01/11/2021 CLINICAL DATA:  Code stroke EXAM: CT HEAD WITHOUT CONTRAST CT ANGIOGRAPHY OF THE HEAD AND NECK TECHNIQUE: Contiguous axial images were obtained from the base of the skull through the vertex without intravenous contrast. Multidetector CT imaging of the head and neck was  performed using the standard protocol during bolus administration of intravenous contrast. Multiplanar CT image reconstructions and MIPs were obtained to evaluate the vascular anatomy. Carotid stenosis measurements (when applicable) are obtained utilizing NASCET criteria, using the distal internal carotid diameter as the denominator. CONTRAST:  15mL OMNIPAQUE IOHEXOL 350 MG/ML SOLN COMPARISON:  None. FINDINGS: CT HEAD Brain: No acute intracranial hemorrhage, mass effect, or edema. Gray-white differentiation is preserved. Patchy hypoattenuation in the supratentorial white matter is nonspecific but probably reflects chronic microvascular ischemic changes. Prominence of the ventricles and sulci reflects generalized parenchymal volume loss. No extra-axial collection. Vascular: No hyperdense vessel. Skull: Unremarkable. Sinuses/Orbits: No acute abnormality. Other: Mastoid air cells are clear. ASPECTS ([place] Stroke Program Early CT Score) - Ganglionic level infarction (caudate, lentiform nuclei, internal capsule, insula, M1-M3 cortex): 7 - Supraganglionic infarction (M4-M6 cortex): 3 Total score (0-10 with 10 being normal): 10 CTA NECK Aortic arch: Great vessel origins are patent. There is mixed plaque along the innominate extending into the subclavian origin with less than 50% stenosis. Right carotid system: Patent. Atherosclerotic  wall thickening with eccentric plaque along the proximal common carotid causing less than 50% stenosis. Minimal calcified plaque at the bifurcation. No stenosis at the ICA origin. Left carotid system: Patent. Mild calcified plaque at the ICA origin without stenosis. Vertebral arteries:Left vertebral artery is patent. Right V1 vertebral artery is not opacified. There is faint enhancement of the right V2 and V3 segments. Skeleton: Degenerative changes of the cervical spine. Other neck: Scarring at the right lung apex with traction bronchiectasis. CTA HEAD Anterior circulation: Intracranial  internal carotid arteries are patent with minimal calcified plaque. Anterior and middle cerebral arteries are patent. Posterior circulation: Intracranial vertebral arteries are patent. Improved opacification of the right vertebral artery beyond the PICA origin probably related to retrograde flow. Basilar artery is patent. Posterior cerebral arteries are patent. Bilateral posterior communicating arteries are present, right larger than left. Venous sinuses: Not well evaluated. IMPRESSION: There is no acute intracranial hemorrhage or evidence of acute infarction. ASPECT score is 10. 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 Patent intracranially with greater flow beyond PICA origin likely from retrograde flow. Otherwise, no hemodynamically significant stenosis. Initial results were communicated to Dr. Cheral Marker at 2:39 p.m. on 01/11/2021 by text page via the [facility] messaging system. Electronically Signed   By: Macy Mis M.D.   On: 01/11/2021 14:50    CT ANGIO NECK W OR WO CONTRAST   Result Date: 01/11/2021 CLINICAL DATA:  Code stroke EXAM: CT HEAD WITHOUT CONTRAST CT ANGIOGRAPHY OF THE HEAD AND NECK TECHNIQUE: Contiguous axial images were obtained from the base of the skull through the vertex without intravenous contrast. Multidetector CT imaging of the head and neck was performed using the standard protocol during bolus administration of intravenous contrast. Multiplanar CT image reconstructions and MIPs were obtained to evaluate the vascular anatomy. Carotid stenosis measurements (when applicable) are obtained utilizing NASCET criteria, using the distal internal carotid diameter as the denominator. CONTRAST:  47mL OMNIPAQUE IOHEXOL 350 MG/ML SOLN COMPARISON:  None. FINDINGS: CT HEAD Brain: No acute intracranial hemorrhage, mass effect, or edema. Gray-white differentiation is preserved. Patchy  hypoattenuation in the supratentorial white matter is nonspecific but probably reflects chronic microvascular ischemic changes. Prominence of the ventricles and sulci reflects generalized parenchymal volume loss. No extra-axial collection. Vascular: No hyperdense vessel. Skull: Unremarkable. Sinuses/Orbits: No acute abnormality. Other: Mastoid air cells are clear. ASPECTS ([place] Stroke Program Early CT Score) - Ganglionic level infarction (caudate, lentiform nuclei, internal capsule, insula, M1-M3 cortex): 7 - Supraganglionic infarction (M4-M6 cortex): 3 Total score (0-10 with 10 being normal): 10 CTA NECK Aortic arch: Great vessel origins are patent. There is mixed plaque along the innominate extending into the subclavian origin with less than 50% stenosis. Right carotid system: Patent. Atherosclerotic wall thickening with eccentric plaque along the proximal common carotid causing less than 50% stenosis. Minimal calcified plaque at the bifurcation. No stenosis at the ICA origin. Left carotid system: Patent. Mild calcified plaque at the ICA origin without stenosis. Vertebral arteries:Left vertebral artery is patent. Right V1 vertebral artery is not opacified. There is faint enhancement of the right V2 and V3 segments. Skeleton: Degenerative changes of the cervical spine. Other neck: Scarring at the right lung apex with traction bronchiectasis. CTA HEAD Anterior circulation: Intracranial internal carotid arteries are patent with minimal calcified plaque. Anterior and middle cerebral arteries are patent. Posterior circulation: Intracranial vertebral arteries are patent. Improved opacification of the right vertebral artery beyond the PICA origin probably related  to retrograde flow. Basilar artery is patent. Posterior cerebral arteries are patent. Bilateral posterior communicating arteries are present, right larger than left. Venous sinuses: Not well evaluated. IMPRESSION: There is no acute intracranial hemorrhage or  evidence of acute infarction. ASPECT score is 10. 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 Patent intracranially with greater flow beyond PICA origin likely from retrograde flow. Otherwise, no hemodynamically significant stenosis. Initial results were communicated to Dr. Cheral Marker at 2:39 p.m. on 01/11/2021 by text page via the [facility] messaging system. Electronically Signed   By: Macy Mis M.D.   On: 01/11/2021 14:50    MR BRAIN W WO CONTRAST   Addendum Date: 01/11/2021   ADDENDUM REPORT: 01/11/2021 18:58 ADDENDUM: There is a wedge-shaped area of diffusion hyperintensity at the left paramedian pons without corresponding T2 hyperintensity suspicious for acute perforator infarct. Updated findings were discussed with Dr. Cheral Marker on 01/11/2021 at 6:50 p.m. Punctate focus of diffusion hyperintensity in the posterior left cerebellum is without correlate on coronal DWI. Electronically Signed   By: Macy Mis M.D.   On: 01/11/2021 18:58    Result Date: 01/11/2021 CLINICAL DATA:  Code stroke follow-up, history of multiple sclerosis EXAM: MRI HEAD WITHOUT AND WITH CONTRAST TECHNIQUE: Multiplanar, multiecho pulse sequences of the brain and surrounding structures were obtained without and with intravenous contrast. CONTRAST:  6mL GADAVIST GADOBUTROL 1 MMOL/ML IV SOLN COMPARISON:  12/14/2019 FINDINGS: Brain: There is no acute infarction or intracranial hemorrhage. There is no intracranial mass, mass effect, or edema. There is no hydrocephalus or extra-axial fluid collection. Foci of T2 hyperintensity are present in the periventricular greater than subcortical white matter consistent with history of multiple sclerosis. A few small foci are also present within the cerebellum. No change since the prior study. Prominence of the ventricles and sulci reflects stable parenchymal volume loss. No abnormal enhancement.  Vascular: Major vessel flow voids at the skull base are preserved. Skull and upper cervical spine: Normal marrow signal is preserved. Sinuses/Orbits: Minor mucosal thickening.  Orbits are unremarkable. Other: Sella is unremarkable.  Mastoid air cells are clear. IMPRESSION: No acute infarction. Stable burden of chronic demyelinating disease. No evidence of active demyelination. Stable parenchymal volume loss. Electronically Signed: By: Macy Mis M.D. On: 01/11/2021 17:16    MR CERVICAL SPINE WO CONTRAST   Result Date: 01/12/2021 CLINICAL DATA:  Acute or progressive myelopathy EXAM: MRI CERVICAL SPINE WITHOUT CONTRAST TECHNIQUE: Multiplanar, multisequence MR imaging of the cervical spine was performed. No intravenous contrast was administered. COMPARISON:  02/18/2017 FINDINGS: Alignment: Exaggerated cervical lordosis. Vertebrae: No fracture, evidence of discitis, or bone lesion. Cord: Normal signal. Posterior Fossa, vertebral arteries, paraspinal tissues: Intracranial findings described on brain MRI from yesterday. No perispinal mass or inflammation noted Disc levels: C2-3: Mild facet spurring. C3-4: Asymmetric left facet spurring with ankylosis C4-5: Ligamentum flavum thickening. Degenerative facet spurring. No neural compression C5-6: Disc narrowing and bulging. Small central disc protrusion. Mild facet spurring with ligamentum flavum thickening. Degenerative interspinous ganglion formation. C6-7: Narrowing of the disc with mild bulging. Borderline facet spurring. C7-T1:Degenerative facet spurring on the left more than right. No herniation or impingement. IMPRESSION: 1. No cord signal abnormality or impingement. 2. Noncompressive degenerative changes are described above. Electronically Signed   By: Monte Fantasia M.D.   On: 01/12/2021 07:30    CT HEAD CODE STROKE WO CONTRAST   Result Date: 01/11/2021 CLINICAL DATA:  Code stroke EXAM: CT HEAD WITHOUT CONTRAST CT ANGIOGRAPHY  OF THE HEAD AND NECK  TECHNIQUE: Contiguous axial images were obtained from the base of the skull through the vertex without intravenous contrast. Multidetector CT imaging of the head and neck was performed using the standard protocol during bolus administration of intravenous contrast. Multiplanar CT image reconstructions and MIPs were obtained to evaluate the vascular anatomy. Carotid stenosis measurements (when applicable) are obtained utilizing NASCET criteria, using the distal internal carotid diameter as the denominator. CONTRAST:  37mL OMNIPAQUE IOHEXOL 350 MG/ML SOLN COMPARISON:  None. FINDINGS: CT HEAD Brain: No acute intracranial hemorrhage, mass effect, or edema. Gray-white differentiation is preserved. Patchy hypoattenuation in the supratentorial white matter is nonspecific but probably reflects chronic microvascular ischemic changes. Prominence of the ventricles and sulci reflects generalized parenchymal volume loss. No extra-axial collection. Vascular: No hyperdense vessel. Skull: Unremarkable. Sinuses/Orbits: No acute abnormality. Other: Mastoid air cells are clear. ASPECTS ([place] Stroke Program Early CT Score) - Ganglionic level infarction (caudate, lentiform nuclei, internal capsule, insula, M1-M3 cortex): 7 - Supraganglionic infarction (M4-M6 cortex): 3 Total score (0-10 with 10 being normal): 10 CTA NECK Aortic arch: Great vessel origins are patent. There is mixed plaque along the innominate extending into the subclavian origin with less than 50% stenosis. Right carotid system: Patent. Atherosclerotic wall thickening with eccentric plaque along the proximal common carotid causing less than 50% stenosis. Minimal calcified plaque at the bifurcation. No stenosis at the ICA origin. Left carotid system: Patent. Mild calcified plaque at the ICA origin without stenosis. Vertebral arteries:Left vertebral artery is patent. Right V1 vertebral artery is not opacified. There is faint enhancement of the right V2 and V3 segments.  Skeleton: Degenerative changes of the cervical spine. Other neck: Scarring at the right lung apex with traction bronchiectasis. CTA HEAD Anterior circulation: Intracranial internal carotid arteries are patent with minimal calcified plaque. Anterior and middle cerebral arteries are patent. Posterior circulation: Intracranial vertebral arteries are patent. Improved opacification of the right vertebral artery beyond the PICA origin probably related to retrograde flow. Basilar artery is patent. Posterior cerebral arteries are patent. Bilateral posterior communicating arteries are present, right larger than left. Venous sinuses: Not well evaluated. IMPRESSION: There is no acute intracranial hemorrhage or evidence of acute infarction. ASPECT score is 10. 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 Patent intracranially with greater flow beyond PICA origin likely from retrograde flow. Otherwise, no hemodynamically significant stenosis. Initial results were communicated to Dr. Cheral Marker at 2:39 p.m. on 01/11/2021 by text page via the [facility] messaging system. Electronically Signed   By: Macy Mis M.D.   On: 01/11/2021 14:50             Medical Problem List and Plan: 1.  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  Antithrombotics: -DVT/anticoagulation: Coumadin per pharmacy             -antiplatelet therapy: Aspirin 81 mg daily 3. Pain Management: Tylenol as needed 4. Mood: Provide emotional support             -antipsychotic agents: N/A 5. Neuropsych: This patient is capable of making decisions on his  own behalf. 6. Skin/Wound Care: Routine skin checks 7. Fluids/Electrolytes/Nutrition: Routine in and outs with follow-up chemistries 8.  UTI.  Urinalysis positive  nitrite.  Continue IV Rocephin.  Urine culture pending 9.  Hypertension.  Lopressor 25 mg twice daily.  Monitor with increased mobility 10.  Hypothyroidism.  TSH 0.371.  Continue thyroid 90 mg daily 11.  Hyperlipidemia.  Lipitor 12.  History of severe mitral regurgitation status post metal mitral valve replacement 2013.  Continue chronic Coumadin.  Cardiac rate controlled 13.  Diastolic congestive heart failure.  Monitor for any signs of fluid overload 14.  COPD.  Check oxygen saturations every shift 15.  Multiple sclerosis diagnosed 1994.  Patient has never been on modifying therapy due to concerns for potential side effects but has not had any reported documented MS flare since diagnosis. 16.  Paroxysmal Atrial Fibrillation- rate control on lopressor, cont Warfarin    Cathlyn Parsons, PA-C 01/13/2021        "I have personally performed a face to face diagnostic evaluation of this patient.  Additionally, I have reviewed and concur with the physician assistant's documentation above." Charlett Blake M.D. [employer] Fellow [employer] Diplomate Am Board of Electrodiagnostic Med Fellow Am Board of Interventional Pain

## 2021-01-13 NOTE — Progress Notes (Signed)
[facility] PROGRESS NOTE    Progress Note  [name] [name]  GYI:[id number] DOB: 1951/05/06 DOA: 01/11/2021 PCP: Ma Hillock, DO     Brief Narrative:   [name] [name] is an 70 y.o. female past medical history significant for mitral regurgitation status post metallic mitral valve replacement, paroxysmal atrial fibrillation, essential hypertension COPD remote history of MS comes in for aphasia right-sided weakness and feeling dizzy.  Significant studies: 01/11/2021 CT of the head showed no acute hemorrhage. 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 01/11/2021 MRI of the brain showed a wedge-shaped area of diffusion on the left paramedian pons suspicious for acute infarct, some chronic myelinating disease Antibiotics: None  Microbiology data: Blood culture:  Procedures: None  Assessment/Plan:   Acute pontine CVA:  HgbA1c 5, fasting lipid panel LDL greater than 100 HDL 62 MRI, MRA of the brain without contrast as above Physical therapy and occupational therapy evaluated the patient and recommended CIR awaiting insurance authorization. CT angio of the head and neck showed high-grade occlusion of the proximal PCA. Transthoracic Echo, previous echo on 05/08/2020 was unremarkable Continue Coumadin per pharmacy Atorvastatin 80  BP goal: permissive HTN upto 220/120 mmHg Telemetry monitoring  History of mitral regurgitation on Coumadin: INR subtherapeutic goal INR 2.5-3.5.  Essential hypertension: Continue metoprolol lisinopril to rule out permissive hypertension.  Frequent PVCs: Continue metoprolol.  Hypothyroidism: Continue Synthroid.  New UTI: Her UA showed multiple bacteria and white blood cells, this morning there was a musty smell in her urine has changed significant in color.  Sent for urine culture Start empirically on IV Rocephin she can be changed to  oral Augmentin as an outpatient.  COPD: Stable.   DVT prophylaxis: coumadin Family Communication:none Status is: Inpatient  Remains inpatient appropriate because:Hemodynamically unstable   Dispo: The patient is from: Home              Anticipated d/c is to: Home              Patient currently is not medically stable to d/c.   Difficult to place patient No  Code Status:     Code Status Orders  (From admission, onward)         Start     Ordered   01/11/21 1920  Full code  Continuous        01/11/21 1920        Code Status History    Date Active Date Inactive Code Status Order ID Comments User Context   01/28/2012 0747 02/03/2012 1840 Full Code [id number]  Rexene Alberts, MD Inpatient   01/26/2012 1858 01/28/2012 0747 Full Code [id number]  Doran Clay, RN Inpatient   01/17/2012 0225 01/26/2012 1858 Full Code [id number]  Toya Smothers, MD Inpatient   Advance Care Planning Activity    Advance Directive Documentation   Flowsheet Row Most Recent Value  Type of Advance Directive Healthcare Power of Attorney  Pre-existing out of facility DNR order (yellow form or pink MOST form) --  "MOST" Form in Place? --        IV Access:    Peripheral IV   Procedures and diagnostic studies:   CT ANGIO HEAD W OR WO CONTRAST  Result Date: 01/11/2021 CLINICAL DATA:  Code stroke EXAM: CT HEAD WITHOUT CONTRAST CT ANGIOGRAPHY OF THE HEAD AND NECK TECHNIQUE: Contiguous axial images were obtained from the base of the skull through the vertex without  intravenous contrast. Multidetector CT imaging of the head and neck was performed using the standard protocol during bolus administration of intravenous contrast. Multiplanar CT image reconstructions and MIPs were obtained to evaluate the vascular anatomy. Carotid stenosis measurements (when applicable) are obtained utilizing NASCET criteria, using the distal internal carotid diameter as the denominator. CONTRAST:  32mL OMNIPAQUE IOHEXOL 350 MG/ML  SOLN COMPARISON:  None. FINDINGS: CT HEAD Brain: No acute intracranial hemorrhage, mass effect, or edema. Gray-white differentiation is preserved. Patchy hypoattenuation in the supratentorial white matter is nonspecific but probably reflects chronic microvascular ischemic changes. Prominence of the ventricles and sulci reflects generalized parenchymal volume loss. No extra-axial collection. Vascular: No hyperdense vessel. Skull: Unremarkable. Sinuses/Orbits: No acute abnormality. Other: Mastoid air cells are clear. ASPECTS ([place] Stroke Program Early CT Score) - Ganglionic level infarction (caudate, lentiform nuclei, internal capsule, insula, M1-M3 cortex): 7 - Supraganglionic infarction (M4-M6 cortex): 3 Total score (0-10 with 10 being normal): 10 CTA NECK Aortic arch: Great vessel origins are patent. There is mixed plaque along the innominate extending into the subclavian origin with less than 50% stenosis. Right carotid system: Patent. Atherosclerotic wall thickening with eccentric plaque along the proximal common carotid causing less than 50% stenosis. Minimal calcified plaque at the bifurcation. No stenosis at the ICA origin. Left carotid system: Patent. Mild calcified plaque at the ICA origin without stenosis. Vertebral arteries:Left vertebral artery is patent. Right V1 vertebral artery is not opacified. There is faint enhancement of the right V2 and V3 segments. Skeleton: Degenerative changes of the cervical spine. Other neck: Scarring at the right lung apex with traction bronchiectasis. CTA HEAD Anterior circulation: Intracranial internal carotid arteries are patent with minimal calcified plaque. Anterior and middle cerebral arteries are patent. Posterior circulation: Intracranial vertebral arteries are patent. Improved opacification of the right vertebral artery beyond the PICA origin probably related to retrograde flow. Basilar artery is patent. Posterior cerebral arteries are patent. Bilateral posterior  communicating arteries are present, right larger than left. Venous sinuses: Not well evaluated. IMPRESSION: There is no acute intracranial hemorrhage or evidence of acute infarction. ASPECT score is 10. 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 Patent intracranially with greater flow beyond PICA origin likely from retrograde flow. Otherwise, no hemodynamically significant stenosis. Initial results were communicated to Dr. Cheral Marker at 2:39 p.m. on 01/11/2021 by text page via the [facility] messaging system. Electronically Signed   By: Macy Mis M.D.   On: 01/11/2021 14:50   CT ANGIO NECK W OR WO CONTRAST  Result Date: 01/11/2021 CLINICAL DATA:  Code stroke EXAM: CT HEAD WITHOUT CONTRAST CT ANGIOGRAPHY OF THE HEAD AND NECK TECHNIQUE: Contiguous axial images were obtained from the base of the skull through the vertex without intravenous contrast. Multidetector CT imaging of the head and neck was performed using the standard protocol during bolus administration of intravenous contrast. Multiplanar CT image reconstructions and MIPs were obtained to evaluate the vascular anatomy. Carotid stenosis measurements (when applicable) are obtained utilizing NASCET criteria, using the distal internal carotid diameter as the denominator. CONTRAST:  57mL OMNIPAQUE IOHEXOL 350 MG/ML SOLN COMPARISON:  None. FINDINGS: CT HEAD Brain: No acute intracranial hemorrhage, mass effect, or edema. Gray-white differentiation is preserved. Patchy hypoattenuation in the supratentorial white matter is nonspecific but probably reflects chronic microvascular ischemic changes. Prominence of the ventricles and sulci reflects generalized parenchymal volume loss. No extra-axial collection. Vascular: No hyperdense vessel. Skull: Unremarkable. Sinuses/Orbits: No acute abnormality. Other: Mastoid air cells  are clear. ASPECTS ([place] Stroke Program  Early CT Score) - Ganglionic level infarction (caudate, lentiform nuclei, internal capsule, insula, M1-M3 cortex): 7 - Supraganglionic infarction (M4-M6 cortex): 3 Total score (0-10 with 10 being normal): 10 CTA NECK Aortic arch: Great vessel origins are patent. There is mixed plaque along the innominate extending into the subclavian origin with less than 50% stenosis. Right carotid system: Patent. Atherosclerotic wall thickening with eccentric plaque along the proximal common carotid causing less than 50% stenosis. Minimal calcified plaque at the bifurcation. No stenosis at the ICA origin. Left carotid system: Patent. Mild calcified plaque at the ICA origin without stenosis. Vertebral arteries:Left vertebral artery is patent. Right V1 vertebral artery is not opacified. There is faint enhancement of the right V2 and V3 segments. Skeleton: Degenerative changes of the cervical spine. Other neck: Scarring at the right lung apex with traction bronchiectasis. CTA HEAD Anterior circulation: Intracranial internal carotid arteries are patent with minimal calcified plaque. Anterior and middle cerebral arteries are patent. Posterior circulation: Intracranial vertebral arteries are patent. Improved opacification of the right vertebral artery beyond the PICA origin probably related to retrograde flow. Basilar artery is patent. Posterior cerebral arteries are patent. Bilateral posterior communicating arteries are present, right larger than left. Venous sinuses: Not well evaluated. IMPRESSION: There is no acute intracranial hemorrhage or evidence of acute infarction. ASPECT score is 10. 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 Patent intracranially with greater flow beyond PICA origin likely from retrograde flow. Otherwise, no hemodynamically significant stenosis. Initial results were communicated to Dr. Cheral Marker at 2:39  p.m. on 01/11/2021 by text page via the [facility] messaging system. Electronically Signed   By: Macy Mis M.D.   On: 01/11/2021 14:50   MR BRAIN W WO CONTRAST  Addendum Date: 01/11/2021   ADDENDUM REPORT: 01/11/2021 18:58 ADDENDUM: There is a wedge-shaped area of diffusion hyperintensity at the left paramedian pons without corresponding T2 hyperintensity suspicious for acute perforator infarct. Updated findings were discussed with Dr. Cheral Marker on 01/11/2021 at 6:50 p.m. Punctate focus of diffusion hyperintensity in the posterior left cerebellum is without correlate on coronal DWI. Electronically Signed   By: Macy Mis M.D.   On: 01/11/2021 18:58   Result Date: 01/11/2021 CLINICAL DATA:  Code stroke follow-up, history of multiple sclerosis EXAM: MRI HEAD WITHOUT AND WITH CONTRAST TECHNIQUE: Multiplanar, multiecho pulse sequences of the brain and surrounding structures were obtained without and with intravenous contrast. CONTRAST:  42mL GADAVIST GADOBUTROL 1 MMOL/ML IV SOLN COMPARISON:  12/14/2019 FINDINGS: Brain: There is no acute infarction or intracranial hemorrhage. There is no intracranial mass, mass effect, or edema. There is no hydrocephalus or extra-axial fluid collection. Foci of T2 hyperintensity are present in the periventricular greater than subcortical white matter consistent with history of multiple sclerosis. A few small foci are also present within the cerebellum. No change since the prior study. Prominence of the ventricles and sulci reflects stable parenchymal volume loss. No abnormal enhancement. Vascular: Major vessel flow voids at the skull base are preserved. Skull and upper cervical spine: Normal marrow signal is preserved. Sinuses/Orbits: Minor mucosal thickening.  Orbits are unremarkable. Other: Sella is unremarkable.  Mastoid air cells are clear. IMPRESSION: No acute infarction. Stable burden of chronic demyelinating disease. No evidence of active demyelination. Stable parenchymal  volume loss. Electronically Signed: By: Macy Mis M.D. On: 01/11/2021 17:16   MR CERVICAL SPINE WO CONTRAST  Result Date: 01/12/2021 CLINICAL DATA:  Acute or  progressive myelopathy EXAM: MRI CERVICAL SPINE WITHOUT CONTRAST TECHNIQUE: Multiplanar, multisequence MR imaging of the cervical spine was performed. No intravenous contrast was administered. COMPARISON:  02/18/2017 FINDINGS: Alignment: Exaggerated cervical lordosis. Vertebrae: No fracture, evidence of discitis, or bone lesion. Cord: Normal signal. Posterior Fossa, vertebral arteries, paraspinal tissues: Intracranial findings described on brain MRI from yesterday. No perispinal mass or inflammation noted Disc levels: C2-3: Mild facet spurring. C3-4: Asymmetric left facet spurring with ankylosis C4-5: Ligamentum flavum thickening. Degenerative facet spurring. No neural compression C5-6: Disc narrowing and bulging. Small central disc protrusion. Mild facet spurring with ligamentum flavum thickening. Degenerative interspinous ganglion formation. C6-7: Narrowing of the disc with mild bulging. Borderline facet spurring. C7-T1:Degenerative facet spurring on the left more than right. No herniation or impingement. IMPRESSION: 1. No cord signal abnormality or impingement. 2. Noncompressive degenerative changes are described above. Electronically Signed   By: Monte Fantasia M.D.   On: 01/12/2021 07:30   CT HEAD CODE STROKE WO CONTRAST  Result Date: 01/11/2021 CLINICAL DATA:  Code stroke EXAM: CT HEAD WITHOUT CONTRAST CT ANGIOGRAPHY OF THE HEAD AND NECK TECHNIQUE: Contiguous axial images were obtained from the base of the skull through the vertex without intravenous contrast. Multidetector CT imaging of the head and neck was performed using the standard protocol during bolus administration of intravenous contrast. Multiplanar CT image reconstructions and MIPs were obtained to evaluate the vascular anatomy. Carotid stenosis measurements (when applicable) are  obtained utilizing NASCET criteria, using the distal internal carotid diameter as the denominator. CONTRAST:  55mL OMNIPAQUE IOHEXOL 350 MG/ML SOLN COMPARISON:  None. FINDINGS: CT HEAD Brain: No acute intracranial hemorrhage, mass effect, or edema. Gray-white differentiation is preserved. Patchy hypoattenuation in the supratentorial white matter is nonspecific but probably reflects chronic microvascular ischemic changes. Prominence of the ventricles and sulci reflects generalized parenchymal volume loss. No extra-axial collection. Vascular: No hyperdense vessel. Skull: Unremarkable. Sinuses/Orbits: No acute abnormality. Other: Mastoid air cells are clear. ASPECTS ([place] Stroke Program Early CT Score) - Ganglionic level infarction (caudate, lentiform nuclei, internal capsule, insula, M1-M3 cortex): 7 - Supraganglionic infarction (M4-M6 cortex): 3 Total score (0-10 with 10 being normal): 10 CTA NECK Aortic arch: Great vessel origins are patent. There is mixed plaque along the innominate extending into the subclavian origin with less than 50% stenosis. Right carotid system: Patent. Atherosclerotic wall thickening with eccentric plaque along the proximal common carotid causing less than 50% stenosis. Minimal calcified plaque at the bifurcation. No stenosis at the ICA origin. Left carotid system: Patent. Mild calcified plaque at the ICA origin without stenosis. Vertebral arteries:Left vertebral artery is patent. Right V1 vertebral artery is not opacified. There is faint enhancement of the right V2 and V3 segments. Skeleton: Degenerative changes of the cervical spine. Other neck: Scarring at the right lung apex with traction bronchiectasis. CTA HEAD Anterior circulation: Intracranial internal carotid arteries are patent with minimal calcified plaque. Anterior and middle cerebral arteries are patent. Posterior circulation: Intracranial vertebral arteries are patent. Improved opacification of the right vertebral artery  beyond the PICA origin probably related to retrograde flow. Basilar artery is patent. Posterior cerebral arteries are patent. Bilateral posterior communicating arteries are present, right larger than left. Venous sinuses: Not well evaluated. IMPRESSION: There is no acute intracranial hemorrhage or evidence of acute infarction. ASPECT score is 10. 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 Patent intracranially with greater flow beyond PICA origin likely from retrograde flow. Otherwise, no hemodynamically significant stenosis.  Initial results were communicated to Dr. Cheral Marker at 2:39 p.m. on 01/11/2021 by text page via the [facility] messaging system. Electronically Signed   By: Macy Mis M.D.   On: 01/11/2021 14:50     Medical Consultants:    None.   Subjective:    [name] [name] no new complaints.  Objective:    Vitals:   01/12/21 2204 01/13/21 0002 01/13/21 0359 01/13/21 0715  BP: (!) 162/74 (!) 146/74 (!) 152/78 (!) 144/73  Pulse: 74 75 80 74  Resp: 18 18 18 18   Temp: (!) 97.5 F (36.4 C) 97.9 F (36.6 C) 97.9 F (36.6 C) 98 F (36.7 C)  TempSrc: Oral Oral Oral Oral  SpO2: 100% 99% 97% 96%  Weight:      Height:       SpO2: 96 %  No intake or output data in the 24 hours ending 01/13/21 0959 Filed Weights   01/11/21 1437  Weight: 69.4 kg    Exam: General exam: In no acute distress. Respiratory system: Good air movement and clear to auscultation. Cardiovascular system: S1 & S2 heard, RRR. No JVD. Gastrointestinal system: Abdomen is nondistended, soft and nontender.  Extremities: No pedal edema. Skin: No rashes, lesions or ulcers Psychiatry: Judgement and insight appear normal. Mood & affect appropriate.  Data Reviewed:    Labs: Basic Metabolic Panel: Recent Labs  Lab 01/11/21 1427 01/11/21 1440  NA 138 137  K 3.5 3.6  CL 102 101  CO2  --  25  GLUCOSE  143* 146*  BUN 27* 27*  CREATININE 0.70 0.78  CALCIUM  --  9.1   GFR Estimated Creatinine Clearance: 62.7 mL/min (by C-G formula based on SCr of 0.78 mg/dL). Liver Function Tests: Recent Labs  Lab 01/11/21 1440  AST 25  ALT 20  ALKPHOS 72  BILITOT 0.8  PROT 6.8  ALBUMIN 3.3*   No results for input(s): LIPASE, AMYLASE in the last 168 hours. No results for input(s): AMMONIA in the last 168 hours. Coagulation profile Recent Labs  Lab 01/11/21 1440 01/12/21 0207 01/13/21 0502  INR 2.2* 2.3* 3.2*   COVID-19 Labs  No results for input(s): DDIMER, FERRITIN, LDH, CRP in the last 72 hours.  Lab Results  Component Value Date   Vivian NEGATIVE 01/11/2021    CBC: Recent Labs  Lab 01/11/21 1427 01/11/21 1440 01/13/21 0502  WBC  --  6.5 6.0  NEUTROABS  --  3.5  --   HGB 13.9 14.3 12.6  HCT 41.0 42.9 36.8  MCV  --  95.3 93.6  PLT  --  263 211   Cardiac Enzymes: No results for input(s): CKTOTAL, CKMB, CKMBINDEX, TROPONINI in the last 168 hours. BNP (last 3 results) No results for input(s): PROBNP in the last 8760 hours. CBG: Recent Labs  Lab 01/11/21 1421  GLUCAP 89   D-Dimer: No results for input(s): DDIMER in the last 72 hours. Hgb A1c: Recent Labs    01/12/21 0207  HGBA1C 5.5   Lipid Profile: Recent Labs    01/12/21 0207  CHOL 204*  HDL 62  LDLCALC 131*  TRIG 53  CHOLHDL 3.3   Thyroid function studies: Recent Labs    01/11/21 01/29/2035  TSH 0.371   Anemia work up: No results for input(s): VITAMINB12, FOLATE, FERRITIN, TIBC, IRON, RETICCTPCT in the last 72 hours. Sepsis Labs: Recent Labs  Lab 01/11/21 1440 01/13/21 0502  WBC 6.5 6.0   Microbiology Recent Results (from the past 240 hour(s))  Resp Panel by  RT-PCR (Flu A&B, Covid) Nasopharyngeal Swab     Status: None   Collection Time: 01/11/21  3:21 PM   Specimen: Nasopharyngeal Swab; Nasopharyngeal(NP) swabs in vial transport medium  Result Value Ref Range Status   SARS Coronavirus 2  by RT PCR NEGATIVE NEGATIVE Final    Comment: (NOTE) SARS-CoV-2 target nucleic acids are NOT DETECTED.  The SARS-CoV-2 RNA is generally detectable in upper respiratory specimens during the acute phase of infection. The lowest concentration of SARS-CoV-2 viral copies this assay can detect is 138 copies/mL. A negative result does not preclude SARS-Cov-2 infection and should not be used as the sole basis for treatment or other patient management decisions. A negative result may occur with  improper specimen collection/handling, submission of specimen other than nasopharyngeal swab, presence of viral mutation(s) within the areas targeted by this assay, and inadequate number of viral copies(<138 copies/mL). A negative result must be combined with clinical observations, patient history, and epidemiological information. The expected result is Negative.  Fact Sheet for Patients:  EntrepreneurPulse.com.au  Fact Sheet for Healthcare Providers:  IncredibleEmployment.be  This test is no t yet approved or cleared by the [place] FDA and  has been authorized for detection and/or diagnosis of SARS-CoV-2 by FDA under an Emergency Use Authorization (EUA). This EUA will remain  in effect (meaning this test can be used) for the duration of the COVID-19 declaration under Section 564(b)(1) of the Act, 21 U.S.C.section 360bbb-3(b)(1), unless the authorization is terminated  or revoked sooner.       Influenza A by PCR NEGATIVE NEGATIVE Final   Influenza B by PCR NEGATIVE NEGATIVE Final    Comment: (NOTE) The Xpert Xpress SARS-CoV-2/FLU/RSV plus assay is intended as an aid in the diagnosis of influenza from Nasopharyngeal swab specimens and should not be used as a sole basis for treatment. Nasal washings and aspirates are unacceptable for Xpert Xpress SARS-CoV-2/FLU/RSV testing.  Fact Sheet for Patients: EntrepreneurPulse.com.au  Fact  Sheet for Healthcare Providers: IncredibleEmployment.be  This test is not yet approved or cleared by the [place] FDA and has been authorized for detection and/or diagnosis of SARS-CoV-2 by FDA under an Emergency Use Authorization (EUA). This EUA will remain in effect (meaning this test can be used) for the duration of the COVID-19 declaration under Section 564(b)(1) of the Act, 21 U.S.C. section 360bbb-3(b)(1), unless the authorization is terminated or revoked.  Performed at [facility], [address], [place], [place] [address]      Medications:   . aspirin EC  81 mg Oral Daily  . atorvastatin  80 mg Oral Daily  . calcium carbonate  1 tablet Oral Q breakfast  . cholecalciferol  6,000 Units Oral Daily  . meclizine  12.5 mg Oral TID  . metoprolol tartrate  25 mg Oral BID  . thyroid  90 mg Oral Daily  . warfarin  5 mg Oral ONCE-1600  . Warfarin - Pharmacist Dosing Inpatient   Does not apply q1600   Continuous Infusions:     LOS: 2 days   Charlynne Cousins  [facility]  01/13/2021, 9:59 AM

## 2021-01-13 NOTE — PMR Pre-admission (Signed)
PMR Admission Coordinator Pre-Admission Assessment  Patient: [name] [name] is an 70 y.o., female MRN: [id number] DOB: 1951/08/06 Height: _0  (170.2 cm) Weight: 69.4 kg              Insurance Information HMO:   PPO:      PCP:      IPA:      80/20: yes     OTHER:  PRIMARY: Medicare Part A and B       Policy#:  [id number]     Subscriber: pt Benefits:  Phone #: verified eligibility online via [place] on 01/13/21     Name: 11/19/2020 Eff. Date: Part A & B effective 11/19/00     Deduct: $1556     Out of Pocket Max: none      Life Max: none  CIR: 100%      SNF: 100% 20 full days, 80% days 21-100 Outpatient: 80%     Co-Pay: 20% Home Health: 100%      Co-Pay: none DME: 80%     Co-Pay: 20% Providers: pt choice  SECONDARY: AARP supplement      Policy#: [id number]      Phone#: [phone]    Financial Counselor:       Phone#:   The "Data Collection Information Summary" for patients in Inpatient Rehabilitation Facilities with attached "Privacy Act [place] Records" was provided and verbally reviewed with: Patient  Emergency Contact Information Contact Information    Name Relation Home Work Mobile   [place] Relative [phone]  [phone]     Current Medical History  Patient Admitting Diagnosis: CVA History of Present Illness:[name] [name] is a 70 y.o. right-handed female with history of severe mitral regurgitation status post metal mitral valve replacement 2013 maintained on chronic Coumadin., PAF, hypertension, COPD, remote history of MS with neurogenic bladder, diastolic congestive heart failure, IBS.  Per chart review patient lives alone.  Independent with assistive device.  Two-level home 4 steps to entry.  She does not drive.  Neighbors help provide transportation to appointments.  Presented 01/11/2021 with acute onset of right-sided weakness and aphasia.  Cranial CT scan showed no acute intracranial hemorrhage or evidence of acute infarction.  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  No hemodynamically significant stenosis.  Patient did not receive TPA.  MRI showed a wedge-shaped area diffusion on the left paramedian pons suspicious for acute infarction.  Stable burden of chronic demyelinating disease.  No evidence of active demyelination.  MRI cervical spine no cord signal abnormality or impingement.  Admission chemistries unremarkable except BUN 27 glucose 143, INR 2.2, hemoglobin 14.3, urinalysis positive nitrite.  Echocardiogram with ejection fraction of 65 to 70% no wall motion abnormalities.  Patient presently remains on Coumadin as prior to admission with the addition of low-dose aspirin.  Tolerating a regular consistency diet.  Therapy evaluations completed due to patient's right side weakness and aphasia recommendations of physical medicine rehab consult.  Complete NIHSS TOTAL: 0 Glasgow Coma Scale Score: 15  Past Medical History  Past Medical History:  Diagnosis Date  . Anxiety   . Arthritis    knees  . Breast cancer ([place]) 1999  . CHF (congestive heart failure) ([place])    Related to severe mitral regurgitation, April, 2013  . 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 Assess by pulmonary team in the hospital April, 2013  .  Ejection fraction    EF 60%, echo, April, 2013, with severe MR before mitral valve replacement  . Herpes   . Hypothyroidism   . IBS (irritable bowel syndrome)   . Mitral valve regurgitation    Mitral valve replacement April, 2013, Mitral valve prolapse  . Multiple sclerosis ([place])   . Neurogenic bladder   . Osteoporosis   . Ovarian cyst   . Paroxysmal atrial fibrillation (HCC)    Rapid atrial fibrillation in-hospital, Rapid cardioversion,  before mitral valve surgery  . Pulmonary hypertension ([place])    Echo, April, 2013, before mitral valve surgery  .  S/P Maze operation for atrial fibrillation 01/26/2012   Complete biatrial lesion set using cryothermy via right mini thoracotomy  . S/P mitral valve replacement 01/26/2012   14m Sorin [employer] Optiform mechanical prosthesis via right mini thoracotomy  . Warfarin anticoagulation    Mechanical mitral prosthesis, April, 20136    Family History  family history includes Breast cancer in her maternal aunt and maternal grandmother; CAD in her father, mother, and another family member; Heart disease in her father; Hyperlipidemia in her mother; Hypertension in her father and mother.  Prior Rehab/Hospitalizations:  Has the patient had prior rehab or hospitalizations prior to admission? No  Has the patient had major surgery during 100 days prior to admission? No  Current Medications   Current Facility-Administered Medications:  .  acetaminophen (TYLENOL) tablet 650 mg, 650 mg, Oral, Q4H PRN **OR** acetaminophen (TYLENOL) 160 MG/5ML solution 650 mg, 650 mg, Per Tube, Q4H PRN **OR** acetaminophen (TYLENOL) suppository 650 mg, 650 mg, Rectal, Q4H PRN, ZWynetta FinesT, MD .  aspirin EC tablet 81 mg, 81 mg, Oral, Daily, FDennison Mascot PA-C, 81 mg at 01/13/21 1043 .  atorvastatin (LIPITOR) tablet 80 mg, 80 mg, Oral, Daily, FCharlynne Cousins MD, 80 mg at 01/13/21 1043 .  calcium carbonate (OS-CAL - dosed in mg of elemental calcium) tablet 500 mg of elemental calcium, 1 tablet, Oral, Q breakfast, ZWynetta FinesT, MD, 500 mg of elemental calcium at 01/13/21 1043 .  cefTRIAXone (ROCEPHIN) 1 g in sodium chloride 0.9 % 100 mL IVPB, 1 g, Intravenous, Q24H, FCharlynne Cousins MD, Last Rate: 200 mL/hr at 01/13/21 1051, 1 g at 01/13/21 1051 .  cholecalciferol (VITAMIN D3) tablet 6,000 Units, 6,000 Units, Oral, Daily, ZLequita Halt MD, 6,000 Units at 01/13/21 1043 .  lidocaine-prilocaine (EMLA) cream 1 application, 1 application, Topical, PRN, ZWynetta FinesT, MD .  meclizine (ANTIVERT) tablet 12.5 mg, 12.5 mg,  Oral, TID, ZWynetta FinesT, MD, 12.5 mg at 01/13/21 1043 .  metoprolol tartrate (LOPRESSOR) tablet 25 mg, 25 mg, Oral, BID, ZWynetta FinesT, MD, 25 mg at 01/13/21 1043 .  senna-docusate (Senokot-S) tablet 1 tablet, 1 tablet, Oral, QHS PRN, ZWynetta FinesT, MD .  thyroid (ARMOUR) tablet 90 mg, 90 mg, Oral, Daily, ZWynetta FinesT, MD, 90 mg at 01/13/21 1044 .  warfarin (COUMADIN) tablet 5 mg, 5 mg, Oral, ONCE-1600, Pierce, Dwayne A, RPH .  Warfarin - Pharmacist Dosing Inpatient, , Does not apply, q1600, EDuanne Limerick RPH  Patients Current Diet:  Diet Order            Diet - low sodium heart healthy           Diet Heart Room service appropriate? Yes; Fluid consistency: Thin  Diet effective now                 Precautions / Restrictions Precautions Precautions: Fall  Restrictions Weight Bearing Restrictions: No   Has the patient had 2 or more falls or a fall with injury in the past year?No  Prior Activity Level Limited Community (1-2x/wk): Pt was active in the community PTA  Prior Functional Level Prior Function Level of Independence: Independent with assistive device(s) Comments: Does not drive; neighbors take care of pt as needed and assist with transport; lively mobile for fall button. Uses walker  Self Care: Did the patient need help bathing, dressing, using the toilet or eating?  Independent  Indoor Mobility: Did the patient need assistance with walking from room to room (with or without device)? Independent  Stairs: Did the patient need assistance with internal or external stairs (with or without device)? Independent  Functional Cognition: Did the patient need help planning regular tasks such as shopping or remembering to take medications? Independent  Home Assistive Devices / Equipment Home Assistive Devices/Equipment: Environmental consultant (specify type) Home Equipment: Walker - 2 wheels,Other (comment),Shower seat - built in,Grab bars - toilet,Grab bars - tub/shower,Cane - single point  (tray for RW)  Prior Device Use: Indicate devices/aids used by the patient prior to current illness, exacerbation or injury? None of the above  Current Functional Level Cognition  Overall Cognitive Status: Within Functional Limits for tasks assessed Orientation Level: Oriented X4    Extremity Assessment (includes Sensation/Coordination)  Upper Extremity Assessment: Generalized weakness,RUE deficits/detail,LUE deficits/detail RUE Deficits / Details: 4/5 MM grade RUE Coordination: decreased gross motor LUE Deficits / Details: 4/5 MM grade LUE Coordination: decreased gross motor  Lower Extremity Assessment: Defer to PT evaluation,Generalized weakness RLE Deficits / Details: Strength 5/5 LLE Deficits / Details: Strength 5/5    ADLs  Overall ADL's : Needs assistance/impaired Eating/Feeding: Set up,Sitting Grooming: Set up,Sitting Upper Body Bathing: Set up,Sitting Lower Body Bathing: Maximal assistance,Sitting/lateral leans,Sit to/from stand,Cueing for safety Upper Body Dressing : Set up,Sitting Lower Body Dressing: Maximal assistance,Sit to/from stand,Sitting/lateral leans,Cueing for safety Toilet Transfer: Minimal assistance,Stand-pivot,BSC,RW Toilet Transfer Details (indicate cue type and reason): Increased time and BSC swapped with recliner Toileting- Clothing Manipulation and Hygiene: Maximal assistance,Sitting/lateral lean,Sit to/from stand Functional mobility during ADLs: Min guard,Cueing for safety,Rolling walker General ADL Comments: Pt limited by decreased strength, decreased coordination, adn decreased ability to care for self. Speech appears to have resolved, but pt unsteady with mobility and unable to assist with LB ADL in standing due to tneed to hold onto RW.    Mobility  Overal bed mobility: Needs Assistance Bed Mobility: Supine to Sit Supine to sit: Min assist General bed mobility comments: Increased time, minA to pull self toward EOB for trunk elevation     Transfers  Overall transfer level: Needs assistance Equipment used: Rolling walker (2 wheeled) Transfers: Sit to/from Stand Sit to Stand: Min guard General transfer comment: minguardA with increased time and momentum used. hand placement assist requried    Ambulation / Gait / Stairs / Wheelchair Mobility  Ambulation/Gait Ambulation/Gait assistance: Counsellor (Feet): 6 Feet Assistive device: Rolling walker (2 wheeled) Gait Pattern/deviations: Step-through pattern,Decreased stride length,Trunk flexed,Narrow base of support General Gait Details: Min guard assist for balance, increased trunk flexion, fatigues easily requiring a seated rest break Gait velocity: decreased Gait velocity interpretation: <1.31 ft/sec, indicative of household ambulator    Posture / Balance Balance Overall balance assessment: Needs assistance Sitting-balance support: Feet supported Sitting balance-Leahy Scale: Good Standing balance support: Bilateral upper extremity supported Standing balance-Leahy Scale: Poor Standing balance comment: reliant on external support    Special needs/care consideration None  Previous Home Environment (from acute therapy documentation) Living Arrangements: Alone  Lives With: Alone Available Help at Discharge: Friend(s),Available PRN/intermittently Type of Home: House Home Layout: Two level Alternate Level Stairs-Rails: Can reach both Alternate Level Stairs-Number of Steps: 14 Home Access: Stairs to enter Entrance Stairs-Rails: Can reach both Entrance Stairs-Number of Steps: 4 Bathroom Shower/Tub: Multimedia programmer: [place]: No Additional Comments: has a Neurosurgeon  Discharge Living Setting Plans for Discharge Living Setting: Alone,Patient's home Type of Home at Discharge: House Discharge Home Layout: Two level Alternate Level Stairs-Rails: Can reach both Alternate Level Stairs-Number of Steps: 14 Discharge Home Access:  Stairs to enter Entrance Stairs-Rails: Can reach both Entrance Stairs-Number of Steps: 4 Discharge Bathroom Shower/Tub: Walk-in shower Discharge Bathroom Toilet: Standard Discharge [place] Accessibility: Yes How Accessible: Accessible via walker Does the patient have any problems obtaining your medications?: No  Social/Family/Support Systems Patient Roles: Other (Comment) Anticipated Caregiver: Jerry Caras can check on Pt. 1x a day, neice may also be able to come stay with Pt. but has not confirmed Caregiver Availability: Intermittent   Goals Patient/Family Goal for Rehab: PT/OT/SLP mod I Expected length of stay: 5-7 days Pt/Family Agrees to Admission and willing to participate: Yes Program Orientation Provided & Reviewed with Pt/Caregiver Including Roles  & Responsibilities: Yes   Decrease burden of Care through IP rehab admission: Specialzed equipment needs, Decrease number of caregivers, Bowel and bladder program and Patient/family education   Possible need for SNF placement upon discharge:not anticipated   Patient Condition: I have reviewed medical [facility], spoken with CSW, and patient. I met with patient at the bedside for inpatient rehabilitation assessment.  Patient will benefit from ongoing PT and OT, can actively participate in 3 hours of therapy a day 5 days of the week, and can make measurable gains during the admission.  Patient will also benefit from the coordinated team approach during an Inpatient Acute Rehabilitation admission.  The patient will receive intensive therapy as well as Rehabilitation physician, nursing, social worker, and care management interventions.  Due to bladder management, safety, disease management, medication administration and patient education the patient requires 24 hour a day rehabilitation nursing.  The patient is currently Min A-Min G with mobility and Max A-Set up with basic ADLs.  Discharge setting and therapy post  discharge at home with home health is anticipated.  Patient has agreed to participate in the Acute Inpatient Rehabilitation Program and will admit today.  Preadmission Screen Completed By: Genella Mech with day of admission updates by  Bethel Born, CCC-SLP, 01/13/2021 1:12 PM ______________________________________________________________________   Discussed status with Dr. Letta Pate on 01/13/21  at 1:12 PM and received approval for admission today.  Admission Coordinator: Genella Mech with day of admission updates by Bethel Born, time 1:12 PM  Sudie Grumbling 01/13/21

## 2021-01-14 DIAGNOSIS — I5032 Chronic diastolic (congestive) heart failure: Secondary | ICD-10-CM

## 2021-01-14 DIAGNOSIS — I10 Essential (primary) hypertension: Secondary | ICD-10-CM

## 2021-01-14 DIAGNOSIS — G35 Multiple sclerosis: Secondary | ICD-10-CM

## 2021-01-14 DIAGNOSIS — N390 Urinary tract infection, site not specified: Secondary | ICD-10-CM

## 2021-01-14 DIAGNOSIS — M436 Torticollis: Secondary | ICD-10-CM

## 2021-01-14 LAB — PROTIME-INR
INR: 2.9 — ABNORMAL HIGH (ref 0.8–1.2)
Prothrombin Time: 29.6 seconds — ABNORMAL HIGH (ref 11.4–15.2)

## 2021-01-14 LAB — URINE CULTURE

## 2021-01-14 MED ORDER — AMOXICILLIN-POT CLAVULANATE 875-125 MG PO TABS
1.0000 | ORAL_TABLET | Freq: Two times a day (BID) | ORAL | Status: AC
  Administered 2021-01-14 – 2021-01-16 (×6): 1 via ORAL
  Filled 2021-01-14 (×6): qty 1

## 2021-01-14 MED ORDER — WARFARIN SODIUM 5 MG PO TABS
5.0000 mg | ORAL_TABLET | Freq: Once | ORAL | Status: AC
  Administered 2021-01-14: 5 mg via ORAL
  Filled 2021-01-14: qty 1

## 2021-01-14 MED ORDER — LISINOPRIL 5 MG PO TABS
5.0000 mg | ORAL_TABLET | Freq: Every day | ORAL | Status: DC
  Administered 2021-01-14 – 2021-01-30 (×17): 5 mg via ORAL
  Filled 2021-01-14 (×17): qty 1

## 2021-01-14 NOTE — Evaluation (Signed)
Physical Therapy Assessment and Plan  Patient Details  Name: [name] [name] MRN: [id number] Date of Birth: 04/09/1951  PT Diagnosis: Abnormal posture, Abnormality of gait, Difficulty walking, Muscle weakness and Pain in neck Rehab Potential: Good ELOS: 3 weeks   Today's Date: 01/14/2021 PT Individual Time: 1100-1159 + 1300-1357  PT Individual Time Calculation (min): 59 min  + 57 min  Hospital Problem: Principal Problem:   Brainstem infarct, acute ([place]) Active Problems:   Left pontine cerebrovascular accident ([place])   Torticollis   Essential hypertension   Acute lower UTI   Past Medical History:  Past Medical History:  Diagnosis Date  . Anxiety   . Arthritis    knees  . Breast cancer ([place]) 1999  . CHF (congestive heart failure) ([place])    Related to severe mitral regurgitation, April, 2013  . 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 Assess by pulmonary team in the hospital April, 2013  . Ejection fraction    EF 60%, echo, April, 2013, with severe MR before mitral valve replacement  . Herpes   . Hypothyroidism   . IBS (irritable bowel syndrome)   . Mitral valve regurgitation    Mitral valve replacement April, 2013, Mitral valve prolapse  . Multiple sclerosis ([place])   . Neurogenic bladder   . Osteoporosis   . Ovarian cyst   . Paroxysmal atrial fibrillation (H[facility])    Rapid atrial fibrillation in-hospital, Rapid cardioversion,  before mitral valve surgery  . Pulmonary hypertension ([place])    Echo, April, 2013, before mitral valve surgery  . S/P Maze operation for atrial fibrillation 01/26/2012   Complete biatrial lesion set using cryothermy via right mini thoracotomy  . S/P mitral valve replacement 01/26/2012   38m Sorin [employer] Optiform mechanical prosthesis via right mini thoracotomy  . Warfarin anticoagulation    Mechanical mitral prosthesis, April, 20136   Past Surgical History:  Past Surgical History:  Procedure Laterality Date  .  BREAST LUMPECTOMY Right 1999   with sent.node, and axillary dissection (20)  . CHEST TUBE INSERTION  01/26/2012   Procedure: CHEST TUBE INSERTION;  Surgeon: CRexene Alberts MD;  [facility]  Service: Open Heart Surgery;  Laterality: Left;  . COLONOSCOPY  2010   "normal"  . CYSTOSCOPY  1992  . LAPAROSCOPIC OVARIAN CYSTECTOMY  1978   urethral stricture repair  . LEFT AND RIGHT HEART CATHETERIZATION WITH CORONARY ANGIOGRAM N/A 01/20/2012   Procedure: LEFT AND RIGHT HEART CATHETERIZATION WITH CORONARY ANGIOGRAM;  Surgeon: CBurnell Blanks MD;  [facility];  Service: Cardiovascular;  Laterality: N/A;  . LYMPHADENECTOMY    . MAZE  01/26/2012   Procedure: MAZE;  Surgeon: CRexene Alberts MD;  [facility]  Service: Open Heart Surgery;  Laterality: N/A;  . MITRAL VALVE REPLACEMENT  01/26/2012   Procedure: MINIMALLY INVASIVE MITRAL VALVE (MV) REPLACEMENT;  Surgeon: CRexene Alberts MD;  [place] [facility]  Service: Open Heart Surgery;  Laterality: Right;  . TEE WITHOUT CARDIOVERSION  01/19/2012   Procedure: TRANSESOPHAGEAL ECHOCARDIOGRAM (TEE);  Surgeon: Peter M JMartinique MD;  [facility];  Service: Cardiovascular;  Laterality: N/A;  . TONSILLECTOMY  1970  . [place] . WRIST SURGERY Right 2012    Assessment & Plan Clinical Impression: Patient is  a 70year old right-handed female history of severe mitral vegetation status post metal mitral valve replacement 2013 maintained on chronic Coumadin, PAF, hypertension, COPD, remote history of MS with neurogenic bladder,  diastolic congestive heart failure, IBS. Per chart review lives alone independent with assistive device. Two-level home 4 steps to entry. She does not drive. Neighbors help provide transportation to appointments. Presented 01/11/2021 with acute onset of right side weakness and aphasia. Cranial CT scan showed no acute intracranial hemorrhage or evidence of acute infarction. 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 No hemodynamically significant stenosis. Patient did not receive TPA. MRI showed a wedge-shaped area of diffusion on the left paramedian pons suspicious for acute infarction. Stable burden of chronic demyelinating disease. No evidence of active demyelination. MRI cervical spine no cord signal abnormality or impingement. Admission chemistries unremarkable except BUN 27 glucose 143 INR 2.2 hemoglobin 14.3, urinalysis positive nitrite. Echocardiogram with ejection fraction of 65 to 70% no wall motion abnormalities. Patient presently remains on Coumadin as prior to admission with the addition of low-dose aspirin. Presently maintained on Rocephin for UTI with urine culture pending. Tolerating a regular consistency diet. Therapy evaluations completed due to patient's right side weakness and aphasia patient was admitted for a comprehensive rehab program. Patient transferred to CIR on 01/13/2021 .   Patient currently requires min with mobility secondary to muscle weakness and muscle joint tightness, decreased cardiorespiratoy endurance, and decreased standing balance, decreased postural control and decreased balance strategies.  Prior to hospitalization, patient was modified independent  with mobility and lived with Alone in a House home.  Home access is 4Stairs to enter.  Patient will benefit from skilled PT intervention to maximize safe functional mobility, minimize fall risk and decrease caregiver burden for planned discharge home with intermittent assist.  Anticipate patient will benefit from follow up [facility] at discharge.  PT - End of Session Activity Tolerance: Tolerates 10 - 20 min activity with multiple rests Endurance Deficit: Yes Endurance Deficit Description: Baseline MS - needs seated rest breaks for recovery PT Assessment Rehab Potential  (ACUTE/IP ONLY): Good PT Barriers to Discharge: Decreased caregiver support;Home environment access/layout;Neurogenic Bowel & Bladder;Incontinence;Lack of/limited family support;Insurance for SNF coverage PT Barriers to Discharge Comments: Town home with full flight to 2nd floor - no family to provide 24/7 PT Patient demonstrates impairments in the following area(s): Balance;Motor;Nutrition;Endurance;Pain;Safety;Skin Integrity PT Transfers Functional Problem(s): Bed Mobility;Bed to Chair;Car;Furniture PT Locomotion Functional Problem(s): Ambulation;Stairs PT Plan PT Intensity: Minimum of 1-2 x/day ,45 to 90 minutes PT Frequency: 5 out of 7 days PT Duration Estimated Length of Stay: 3 weeks PT Treatment/Interventions: Ambulation/gait training;Discharge planning;Functional mobility training;Psychosocial support;Therapeutic Activities;Visual/perceptual remediation/compensation;Wheelchair propulsion/positioning;Therapeutic Exercise;Skin care/wound management;Neuromuscular re-education;Disease management/prevention;Cognitive remediation/compensation;DME/adaptive equipment instruction;Pain management;Splinting/orthotics;UE/LE Strength taining/ROM;Balance/vestibular training;Community reintegration;Patient/family education;Stair training;UE/LE Coordination activities PT Transfers Anticipated Outcome(s): mod I PT Locomotion Anticipated Outcome(s): mod I PT Recommendation Follow Up Recommendations: Home health PT Patient destination: Home Equipment Recommended: To be determined Equipment Details: Pt owns RW and rollator and cane   PT Evaluation Precautions/Restrictions Precautions Precautions: Fall Precaution Comments: Neurogenic bladder - incontinent (premorbid) Restrictions Weight Bearing Restrictions: No General Chart Reviewed: Yes Additional Pertinent History: history of severe mitral vegetation status post metal mitral valve replacement 2013 maintained on chronic Coumadin, PAF, hypertension,  COPD, remote history of MS with neurogenic bladder, diastolic congestive heart failure, IBS Family/Caregiver Present: No Vital Signs Pain Pain Assessment Pain Scale: 0-10 Pain Score: Asleep Pain Type: Acute pain Pain Location: Neck Pain Orientation: Right;Left Pain Descriptors / Indicators: Aching Pain Frequency: Rarely Pain Onset: Gradual Patients Stated Pain Goal: 1 Pain Intervention(s): Medication (See eMAR);Repositioned;Heat applied Home Living/Prior Functioning  Home Living Living Arrangements: Alone Available Help at Discharge: Friend(s);Available PRN/intermittently Type of Home: House Home Access: Stairs to enter [employer] of Steps: 4 Entrance Stairs-Rails: Can reach both Home Layout: Two level Alternate Level Stairs-Number of Steps: 14 Alternate Level Stairs-Rails: Can reach both  Lives With: Alone Prior Function Level of Independence: Requires assistive device for independence  Able to Take Stairs?: Yes Driving: Yes Comments: Uses a RW/rollator for household mobility - denies h/o falls (since 2020). Neighbor assists with taking out trash and carrying laundry from 1st/2nd floor. Vision/Perception  Perception Perception: Within Functional Limits Praxis Praxis: Intact  Cognition Overall Cognitive Status: Within Functional Limits for tasks assessed Arousal/Alertness: Awake/alert Orientation Level: Oriented X4 Attention: Focused;Sustained Focused Attention: Appears intact Sustained Attention: Appears intact Problem Solving: Impaired Problem Solving Impairment: Verbal complex;Functional complex Safety/Judgment: Impaired Sensation Sensation Light Touch: Appears Intact Hot/Cold: Appears Intact Proprioception: Not tested Stereognosis: Not tested Coordination Gross Motor Movements are Fluid and Coordinated: No Coordination and Movement Description: Effortful movement patterns, neck pain limiting Motor  Motor Motor: Abnormal postural alignment and  control   Trunk/Postural Assessment  Cervical Assessment Cervical Assessment: Exceptions to WFL (Forward head, L lateral tilt) Thoracic Assessment Thoracic Assessment: Exceptions to [facility] (kyphosis and rounded shoulders) Lumbar Assessment Lumbar Assessment: Exceptions to [facility] (posterior pelvic tilt) Postural Control Postural Control: Deficits on evaluation Head Control: Maintained L lateral tilt during all functional mobility and while resting. Able to perform PROM to almost neutral Righting Reactions: delayed and inadequate  Balance Balance Balance Assessed: Yes Static Sitting Balance Static Sitting - Balance Support: Feet supported Static Sitting - Level of Assistance: 5: Stand by assistance Dynamic Sitting Balance Dynamic Sitting - Balance Support: Feet supported;During functional activity Dynamic Sitting - Level of Assistance: 4: Min assist Static Standing Balance Static Standing - Balance Support: During functional activity;Bilateral upper extremity supported Static Standing - Level of Assistance: 4: Min assist Dynamic Standing Balance Dynamic Standing - Balance Support: During functional activity;No upper extremity supported Dynamic Standing - Level of Assistance: 4: Min assist;3: Mod assist Extremity Assessment      RLE Assessment RLE Assessment: Exceptions to [facility] General Strength Comments: Grossly 4-/5 LLE Assessment LLE Assessment: Exceptions to [facility] General Strength Comments: Grossly 4-/5  Care Tool Care Tool Bed Mobility Roll left and right activity   Roll left and right assist level: Minimal Assistance - Patient > 75%    Sit to lying activity   Sit to lying assist level: Minimal Assistance - Patient > 75%    Lying to sitting edge of bed activity   Lying to sitting edge of bed assist level: Minimal Assistance - Patient > 75%     Care Tool Transfers Sit to stand transfer   Sit to stand assist level: Minimal Assistance - Patient > 75%    Chair/bed transfer    Chair/bed transfer assist level: Minimal Assistance - Patient > 75%     Psychologist, counselling transfer activity did not occur: Safety/medical concerns        Care Tool Locomotion Ambulation   Assist level: Minimal Assistance - Patient > 75% Assistive device: Walker-rolling Max distance: 10  Walk 10 feet activity   Assist level: Minimal Assistance - Patient > 75% Assistive device: Walker-rolling   Walk 50 feet with 2 turns activity Walk 50 feet with 2 turns activity did not occur: Safety/medical concerns      Walk 150 feet activity Walk 150 feet activity did not occur:  Safety/medical concerns      Walk 10 feet on uneven surfaces activity Walk 10 feet on uneven surfaces activity did not occur: Safety/medical concerns      Stairs Stair activity did not occur: Safety/medical concerns        Walk up/down 1 step activity Walk up/down 1 step or curb (drop down) activity did not occur: Safety/medical concerns     Walk up/down 4 steps activity did not occuR: Safety/medical concerns  Walk up/down 4 steps activity      Walk up/down 12 steps activity Walk up/down 12 steps activity did not occur: Safety/medical concerns      Pick up small objects from floor Pick up small object from the floor (from standing position) activity did not occur: Safety/medical concerns      Wheelchair Will patient use wheelchair at discharge?: No   Wheelchair activity did not occur: N/A      Wheel 50 feet with 2 turns activity Wheelchair 50 feet with 2 turns activity did not occur: N/A    Wheel 150 feet activity Wheelchair 150 feet activity did not occur: N/A      Refer to Care Plan for Long Term Goals  SHORT TERM GOAL WEEK 1 PT Short Term Goal 1 (Week 1): Pt will complete bed mobility without bed features with supervision PT Short Term Goal 2 (Week 1): Pt will complete bed<>chair transfers with CGA and LRAD PT Short Term Goal 3 (Week 1): Pt will ambulate 1f with CGA and  LRAD PT Short Term Goal 4 (Week 1): Pt will initiate stair training  Recommendations for other services: None   Skilled Therapeutic Intervention Mobility Bed Mobility Bed Mobility: Supine to Sit;Sit to Supine Supine to Sit: Minimal Assistance - Patient > 75% Sit to Supine: Minimal Assistance - Patient > 75% Transfers Transfers: Sit to Stand;Stand to Sit;Stand Pivot Transfers Sit to Stand: Minimal Assistance - Patient > 75% Stand to Sit: Minimal Assistance - Patient > 75% Stand Pivot Transfers: Minimal Assistance - Patient > 75% Stand Pivot Transfer Details: Verbal cues for safe use of DME/AE;Verbal cues for gait pattern;Visual cues/gestures for sequencing;Verbal cues for sequencing;Verbal cues for precautions/safety;Verbal cues for technique;Visual cues for safe use of DME/AE;Tactile cues for weight shifting;Tactile cues for sequencing;Tactile cues for posture;Tactile cues for placement;Visual cues/gestures for precautions/safety;Manual facilitation for weight shifting Transfer (Assistive device): Rolling walker Locomotion  Gait Ambulation: Yes Gait Assistance: Minimal Assistance - Patient > 75% Gait Distance (Feet): 10 Feet Assistive device: Rolling walker Gait Assistance Details: Verbal cues for sequencing;Verbal cues for gait pattern;Visual cues/gestures for sequencing;Manual facilitation for weight shifting;Verbal cues for technique;Verbal cues for precautions/safety;Verbal cues for safe use of DME/AE;Tactile cues for sequencing;Tactile cues for initiation;Tactile cues for weight shifting;Visual cues for safe use of DME/AE Gait Gait: Yes Gait Pattern: Impaired Gait Pattern: Step-to pattern;Shuffle;Wide base of support;Decreased step length - left;Decreased step length - right;Trunk flexed;Poor foot clearance - right Gait velocity: decreased Stairs / Additional Locomotion Stairs: No Wheelchair Mobility Wheelchair Mobility: No  Skilled Intervention:  1st session Pt greeted  supine in bed, awake and agreeable to therapy. Pt pleasant, oriented x4 - quite verbose but easily redirectable when needed. Completed and initiated functional mobility as outlined above. Limited functional mobility outside of room as pt was incontinent of bladder during subjective history and PLOF assessment. Required minA for bed mobility, able to sit EOB with SBA, required minA for sit<>stand to RW and [facility] for stand[facility] Required maxA for removing dirty pants/briefs and modA for donning  new clean ones (primarily for threading). Required minA for standing balance while she pulled them over hips. Ambulated ~47f with RW and minA within her room back to bed as she requested to lay down. Gait deficits include very short shuffling steps, poor RW management, forward flexed trunk. She remained supine in bed at end of session with needs in reach and bed alarm on.  Instructed pt in results of PT evaluation as detailed above, PT POC, rehab potential, rehab goals, and discharge recommendations. Additionally discussed CIR's policies regarding fall safety and use of chair alarm and/or quick release belt. Pt verbalized understanding and in agreement. Will update pt's family members as they become available.   2nd session Pt greeted sitting in chair, agreeable to therapy without reports of pain. Pt requested to use bathroom prior to going to therapy gyms. Stand<>pivot transfer with minA and [facility] continent of bladder and able to perform frontal pericare with setupA. Required minA for sit<>stand to RW and minA for standing balance while she pulled pants over hips. Stand<>pivot back to her chair with minA and RW. She removed hospital socks and donned regular socks and tennis shoes with setupA while seated in chair. PT retrieved 18x18 w/c from DME closet and pt ambulated in her room with minA and RW to w/c - cues for RW management, postural awareness, step length, and safety approach to w/c. W/c transport  to day room gym. Completed car transfer with minA and RW with car height set to simulate her Rav4 - needing minA for powering to rise and steadying while stepping to car. Completed TUG, with minA and RW - completed in 271m32 seconds. TUG score >13.5 seconds indicates increased falls risk. Increased difficulty navigating turning during TUG with VERY small shuffling steps and sometimes leaving her L foot outside of the walker frame. Returned patient to her room with toNashn w/c. Completed stand<>pivot transfer with minA and RW to EOB. She removed her tennis shoes with supervision and performed sit>supine with CGA with use of bed features. Remained supine in bed at end of session with bed alarm on and needs within reach.  Discharge Criteria: Patient will be discharged from PT if patient refuses treatment 3 consecutive times without medical reason, if treatment goals not met, if there is a change in medical status, if patient makes no progress towards goals or if patient is discharged from hospital.  The above assessment, treatment plan, treatment alternatives and goals were discussed and mutually agreed upon: by patient  ChAlger SimonsT, DPT 01/14/2021, 12:46 PM

## 2021-01-14 NOTE — Progress Notes (Signed)
This nurse and NT are rounding on the pt hourly. Pt appears to be "tired" but refusing to go to sleep. Pt educated on the importance of adequate rest. Pt states " I am just not tired" but continues to dose off while talking. Pt also appears to be very anxious. Provider will be made aware.

## 2021-01-14 NOTE — Progress Notes (Signed)
ANTICOAGULATION CONSULT NOTE  Pharmacy Consult for Warfarin Indication: mechanical MVR  Allergies  Allergen Reactions  . Erythromycin Nausea And Vomiting  . Valsartan Other (See Comments)    Pt reports caused her depression    Patient Measurements: Height: 5' 6.5" (168.9 cm) Weight: 64 kg (141 lb 1.5 oz) IBW/kg (Calculated) : 60.45  Vital Signs: Temp: 97.9 F (36.6 C) (03/29 0435) BP: 148/76 (03/29 0435) Pulse Rate: 72 (03/29 0435)  Labs: Recent Labs    01/11/21 1427 01/11/21 1427 01/11/21 1440 01/12/21 0207 01/13/21 0502 01/14/21 0513  HGB 13.9  --  14.3  --  12.6  --   HCT 41.0  --  42.9  --  36.8  --   PLT  --   --  263  --  211  --   APTT  --   --  26  --   --   --   LABPROT  --    < > 23.8* 24.9* 31.6* 29.6*  INR  --    < > 2.2* 2.3* 3.2* 2.9*  CREATININE 0.70  --  0.78  --   --   --    < > = values in this interval not displayed.    Estimated Creatinine Clearance: 61.6 mL/min (by C-G formula based on SCr of 0.78 mg/dL).   Assessment: Patient is a 75 yof that presented to the ED with concerns for a stroke. The patient is on warfarin for a mechanical MVR with an INR goal of 2.5-3.5. PTA Regimen: 7.5mg  M /Thur and 5mg  all other days  INR is therapeutic at 2.9 today. CBC stable, no bleeding noted.  Goal of Therapy:  INR 2.5-3.5 Monitor platelets by anticoagulation protocol: Yes   Plan:  - Warfarin 5 mg PO tonight  - Monitor daily INR and CBC - Monitor patient for s/s of bleeding   Thank you for involving pharmacy in this patient's care.  Renold Genta, PharmD, BCPS Clinical Pharmacist Clinical phone for 01/14/2021 until 3p is [phone] 01/14/2021 9:41 AM  **Pharmacist phone directory can be found on [email or web address] listed under [place]**

## 2021-01-14 NOTE — Progress Notes (Addendum)
Met with the patient to review role of the nurse CM and address educational needs related to secondary stroke risks including HTN, HLD (LDL 131/total 204), and PAF with HF. Reviewed hx of MS and incontinence PTA; using pads and briefs and [place] CVA. Reviewed toileting protocol which the patient noted she did PTA as well. Patient given handouts on DASH diet, statin and blood pressure management. Patient with Albumin 3.3 and noted she was drinking Boost high protein drinks twice a day PTA and taking multiple supplements. Discussed protein drinks and vitamin K content found in different brands. Continue to follow along to discharge to address questions and educational needs. Collaborate with the SW to facilitate preparation for discharge. Margarito Liner

## 2021-01-14 NOTE — Progress Notes (Signed)
Inpatient Rehabilitation  Patient information reviewed and entered into eRehab system by Alvin Rubano M. Jkayla Spiewak, M.A., CCC/SLP, PPS Coordinator.  Information including medical coding, functional ability and quality indicators will be reviewed and updated through discharge.    

## 2021-01-14 NOTE — Progress Notes (Signed)
[place] PHYSICAL MEDICINE & REHABILITATION PROGRESS NOTE  Subjective/Complaints:  Patient seen sitting up in bed this morning.  She states she did not sleep well overnight due to frequent urination.  This is a chronic problem, for which she wears pads/diapers at home, but were not available to her overnight.  She states her daughter-in-law will be bringing them in today. Discussed torticollis and treatment options with patient is well, patient states she does not like injections  ROS: + Urinary frequency (baseline). Denies CP, SOB, N/V/D  Objective: Vital Signs: Blood pressure (!) 148/76, pulse 72, temperature 97.9 F (36.6 C), resp. rate 17, height 5' 6.5" (1.689 m), weight 64 kg, SpO2 95 %. No results found. Recent Labs    01/11/21 1440 01/13/21 0502  WBC 6.5 6.0  HGB 14.3 12.6  HCT 42.9 36.8  PLT 263 211   Recent Labs    01/11/21 1427 01/11/21 1440  NA 138 137  K 3.5 3.6  CL 102 101  CO2  --  25  GLUCOSE 143* 146*  BUN 27* 27*  CREATININE 0.70 0.78  CALCIUM  --  9.1    Intake/Output Summary (Last 24 hours) at 01/14/2021 0934 Last data filed at 01/14/2021 0437 Gross per 24 hour  Intake 240 ml  Output 455 ml  Net -215 ml        Physical Exam: BP (!) 148/76 (BP Location: Left Arm)   Pulse 72   Temp 97.9 F (36.6 C)   Resp 17   Ht 5' 6.5" (1.689 m)   Wt 64 kg   SpO2 95%   BMI 22.43 kg/m  Constitutional: No distress . Vital signs reviewed. HENT: Normocephalic.  Atraumatic. Eyes: EOMI. No discharge. Cardiovascular: No JVD.  RRR. Respiratory: Normal effort.  No stridor.  Bilateral clear to auscultation. GI: Non-distended.  BS +. Skin: Warm and dry.  Intact. Psych: Normal mood.  Normal behavior. Musc: No edema in extremities.  No tenderness in extremities. Neurologic: Alert Motor: 4/5 throughout, right slightly weaker than left + Torticollis  Assessment/Plan: 1. Functional deficits which require 3+ hours per day of interdisciplinary therapy in a  comprehensive inpatient rehab setting.  Physiatrist is providing close team supervision and 24 hour management of active medical problems listed below.  Physiatrist and rehab team continue to assess barriers to discharge/monitor patient progress toward functional and medical goals   Care Tool:  Bathing              Bathing assist       Upper Body Dressing/Undressing Upper body dressing   What is the patient wearing?: Hospital gown only    Upper body assist      Lower Body Dressing/Undressing Lower body dressing            Lower body assist       Toileting Toileting    Toileting assist Assist for toileting: Total Assistance - Patient < 25% (purewick, incontinence with briefs PTA)     Transfers Chair/bed transfer  Transfers assist  Chair/bed transfer activity did not occur: Safety/medical concerns        Locomotion Ambulation   Ambulation assist              Walk 10 feet activity   Assist           Walk 50 feet activity   Assist           Walk 150 feet activity   Assist  Walk 10 feet on uneven surface  activity   Assist           Wheelchair     Assist               Wheelchair 50 feet with 2 turns activity    Assist            Wheelchair 150 feet activity     Assist           Medical Problem List and Plan: 1.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 Antithrombotics: -DVT/anticoagulation:Coumadin per pharmacy -antiplatelet therapy: Aspirin 81 mg daily 3. Pain Management:Tylenol as needed 4. Mood:Provide emotional support -antipsychotic agents: N/A 5. Neuropsych: This patientiscapable of making decisions on hisown behalf. 6. Skin/Wound Care:Routine skin checks 7. Fluids/Electrolytes/Nutrition:Routine in and outs  CMP ordered for tomorrow 8. UTI.   Urinalysis positive  nitrite, Urine culture remain pending  Rocephin transition to Augmentin through 4/1 9. Hypertension. Lopressor 25 mg twice daily.   Monitor with increased mobility 10. Hypothyroidism. TSH 0.371. Continue thyroid 90 mg daily 11. Hyperlipidemia. Lipitor 12. History of severe mitral regurgitation status post metal mitral valve replacement 2013. Continue chronic Coumadin. 13. Diastolic congestive heart failure. Monitor for any signs of fluid overload Filed Weights   01/13/21 1700  Weight: 64 kg   14. COPD. Check oxygen saturations every shift 15. 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 Patient has never been on modifying therapy due to concerns for potential side effects but has not had any reported documented MS flare since diagnosis.  Patient states she urinates every 5 minutes and wears depends at home, which were not available to her, daughter-in-law to bring in  Pure wick nightly 16.  Paroxysmal Atrial Fibrillation- rate control on lopressor, cont Warfarin 17.  Torticollis  Patient wary of injections, and notes she has grown accustomed to it, is willing to work with therapies to improve range of motion   LOS: 1 days A FACE TO FACE EVALUATION WAS PERFORMED  Dezerae Freiberger Lorie Phenix 01/14/2021, 9:34 AM

## 2021-01-14 NOTE — Evaluation (Signed)
Occupational Therapy Assessment and Plan  Patient Details  Name: [name] [name] MRN: [id number] Date of Birth: Oct 09, 1951  OT Diagnosis: abnormal posture and muscle weakness (generalized) Rehab Potential: Rehab Potential (ACUTE ONLY): Good ELOS: 21 days   Today's Date: 01/14/2021 OT Individual Time: 0923-3007 OT Individual Time Calculation (min): 60 min     Hospital Problem: Principal Problem:   Brainstem infarct, acute ([place]) Active Problems:   Left pontine cerebrovascular accident ([place])   Torticollis   Essential hypertension   Acute lower UTI   Past Medical History:  Past Medical History:  Diagnosis Date  . Anxiety   . Arthritis    knees  . Breast cancer ([place]) 1999  . CHF (congestive heart failure) ([place])    Related to severe mitral regurgitation, April, 2013  . 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 Assess by pulmonary team in the hospital April, 2013  . Ejection fraction    EF 60%, echo, April, 2013, with severe MR before mitral valve replacement  . Herpes   . Hypothyroidism   . IBS (irritable bowel syndrome)   . Mitral valve regurgitation    Mitral valve replacement April, 2013, Mitral valve prolapse  . Multiple sclerosis ([place])   . Neurogenic bladder   . Osteoporosis   . Ovarian cyst   . Paroxysmal atrial fibrillation ([facility])    Rapid atrial fibrillation in-hospital, Rapid cardioversion,  before mitral valve surgery  . Pulmonary hypertension ([place])    Echo, April, 2013, before mitral valve surgery  . S/P Maze operation for atrial fibrillation 01/26/2012   Complete biatrial lesion set using cryothermy via right mini thoracotomy  . S/P mitral valve replacement 01/26/2012   59mm Sorin [employer] Optiform mechanical prosthesis via right mini thoracotomy  . Warfarin anticoagulation    Mechanical mitral prosthesis, April, 20136   Past Surgical History:  Past Surgical History:  Procedure Laterality Date  . BREAST LUMPECTOMY Right  1999   with sent.node, and axillary dissection (20)  . CHEST TUBE INSERTION  01/26/2012   Procedure: CHEST TUBE INSERTION;  Surgeon: Rexene Alberts, MD;  Location: [facility];  Service: Open Heart Surgery;  Laterality: Left;  . COLONOSCOPY  2010   "normal"  . CYSTOSCOPY  1992  . LAPAROSCOPIC OVARIAN CYSTECTOMY  1978   urethral stricture repair  . LEFT AND RIGHT HEART CATHETERIZATION WITH CORONARY ANGIOGRAM N/A 01/20/2012   Procedure: LEFT AND RIGHT HEART CATHETERIZATION WITH CORONARY ANGIOGRAM;  Surgeon: Burnell Blanks, MD;  Location: [facility] CATH LAB;  Service: Cardiovascular;  Laterality: N/A;  . LYMPHADENECTOMY    . MAZE  01/26/2012   Procedure: MAZE;  Surgeon: Rexene Alberts, MD;  Location: [facility];  Service: Open Heart Surgery;  Laterality: N/A;  . MITRAL VALVE REPLACEMENT  01/26/2012   Procedure: MINIMALLY INVASIVE MITRAL VALVE (MV) REPLACEMENT;  Surgeon: Rexene Alberts, MD;  Location: [facility];  Service: Open Heart Surgery;  Laterality: Right;  . TEE WITHOUT CARDIOVERSION  01/19/2012   Procedure: TRANSESOPHAGEAL ECHOCARDIOGRAM (TEE);  Surgeon: Peter M Martinique, MD;  Location: [facility] ENDOSCOPY;  Service: Cardiovascular;  Laterality: N/A;  . TONSILLECTOMY  1970  . [place]  . WRIST SURGERY Right 2012    Assessment & Plan Clinical Impression:    [name] [name] is a 70 year old right-handed female history of severe mitral vegetation status post metal mitral valve replacement 2013 maintained on chronic Coumadin, PAF, hypertension, COPD, remote history of MS with neurogenic bladder, diastolic congestive heart  failure, IBS.  Per chart review lives alone independent with assistive device.  Two-level home 4 steps to entry.  She does not drive.  Neighbors help provide transportation to appointments.  Presented 01/11/2021 with acute onset of right side weakness and aphasia.  Cranial CT scan showed no acute intracranial hemorrhage or evidence of acute infarction.  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  No hemodynamically significant stenosis.  Patient did not receive TPA.  MRI showed a wedge-shaped area of diffusion on the left paramedian pons suspicious for acute infarction.  Stable burden of chronic demyelinating disease.  No evidence of active demyelination.  MRI cervical spine no cord signal abnormality or impingement.  Admission chemistries unremarkable except BUN 27 glucose 143 INR 2.2 hemoglobin 14.3, urinalysis positive nitrite.  Echocardiogram with ejection fraction of 65 to 70% no wall motion abnormalities.  Patient presently remains on Coumadin as prior to admission with the addition of low-dose aspirin.  Presently maintained on Rocephin for UTI with urine culture pending.  Tolerating a regular consistency diet.  Therapy evaluations completed due to patient's right side weakness and aphasia patient was admitted for a comprehensive rehab program    Patient transferred to CIR on 01/13/2021 .    Patient currently requires max with basic self-care skills secondary to muscle weakness, unbalanced muscle activation and decreased coordination, decreased attention to left and decreased sitting balance, decreased standing balance, decreased postural control and decreased balance strategies.  Prior to hospitalization, patient could complete BADLs with modified independent .  Patient will benefit from skilled intervention to increase independence with basic self-care skills and increase level of independence with iADL prior to discharge home independently.  Anticipate patient will require intermittent supervision and follow up home health.  OT - End of Session Endurance Deficit: Yes Endurance Deficit Description: Baseline MS - needs seated rest breaks for recovery OT Assessment Rehab Potential (ACUTE ONLY): Good OT Barriers to Discharge: Decreased caregiver  support OT Barriers to Discharge Comments: pt has neighbors that check on her; dtr in law in [place] OT Patient demonstrates impairments in the following area(s): Balance;Endurance;Motor;Perception OT Basic ADL's Functional Problem(s): Bathing;Dressing;Toileting OT Advanced ADL's Functional Problem(s): Simple Meal Preparation OT Transfers Functional Problem(s): Toilet;Tub/Shower OT Additional Impairment(s): None OT Plan OT Intensity: Minimum of 1-2 x/day, 45 to 90 minutes OT Frequency: 5 out of 7 days OT Duration/Estimated Length of Stay: 21 days OT Treatment/Interventions: Balance/vestibular training;Discharge planning;DME/adaptive equipment instruction;Functional mobility training;Pain management;Neuromuscular re-education;Patient/family education;Psychosocial support;Self Care/advanced ADL retraining;Therapeutic Activities;Therapeutic Exercise;UE/LE Strength taining/ROM;UE/LE Coordination activities OT Self Feeding Anticipated Outcome(s): no goal, pt is independent OT Basic Self-Care Anticipated Outcome(s): Mod I OT Toileting Anticipated Outcome(s): Mod I OT Bathroom Transfers Anticipated Outcome(s): Mod I OT Recommendation Patient destination: Home Follow Up Recommendations: Home health OT Equipment Recommended: To be determined   OT Evaluation Precautions/Restrictions  Precautions Precautions: Fall Precaution Comments: Neurogenic bladder - incontinent (premorbid) Restrictions Weight Bearing Restrictions: No Pain Pain Assessment Pain Scale: 0-10  Pain Type: Acute pain Pain Location: Neck Pain Orientation: Right;Left Pain Descriptors / Indicators: Aching Pain Frequency: Rarely Pain Onset: Gradual Patients Stated Pain Goal: 1 Pain Intervention(s): Medication (See eMAR);Repositioned;Heat applied Home Living/Prior Functioning Home Living Family/patient expects to be discharged to:: Private residence Living Arrangements: Alone Available Help at Discharge:  Friend(s),Available PRN/intermittently Type of Home: House Home Access: Stairs to enter [employer] of Steps: 4 Entrance Stairs-Rails: Can reach both Home Layout: Two level Alternate  Level Stairs-Number of Steps: 14 Alternate Level Stairs-Rails: Can reach both Bathroom Shower/Tub: Walk-in shower Additional Comments: has a Neurosurgeon  Lives With: Alone Prior Function Level of Independence: Requires assistive device for independence  Able to Take Stairs?: Yes Driving: Yes Comments: Uses a RW/rollator for household mobility - denies h/o falls (since 2020). Neighbor assists with taking out trash and carrying laundry from 1st/2nd floor. Vision Baseline Vision/History: Wears glasses Wears Glasses: At all times Patient Visual Report: No change from baseline Vision Assessment?: No apparent visual deficits Perception  Perception: Within Functional Limits Praxis Praxis: Intact Cognition Overall Cognitive Status: Within Functional Limits for tasks assessed Arousal/Alertness: Awake/alert Orientation Level: Person;Place;Situation Person: Oriented Place: Oriented Situation: Oriented Year: 2022 Month: March Day of Week: Correct Memory: Appears intact Immediate Memory Recall: Sock;Blue;Bed Memory Recall Sock: Without Cue Memory Recall Blue: Without Cue Memory Recall Bed: Without Cue Attention: Focused;Sustained Focused Attention: Appears intact Sustained Attention: Appears intact Awareness: Impaired (decreased awareness of L LE) Problem Solving: Impaired Problem Solving Impairment: Verbal complex;Functional complex Safety/Judgment: Impaired Sensation Sensation Light Touch: Appears Intact Hot/Cold: Appears Intact Proprioception: Not tested Stereognosis: Appears Intact Additional Comments: pt demonstrates decreased awareness of LLE,could be proprioceptive. needs more assessment. Coordination Gross Motor Movements are Fluid and Coordinated: No Fine Motor Movements are Fluid  and Coordinated: Yes Coordination and Movement Description: Effortful movement patterns, neck pain limiting Finger Nose Finger Test: functional Motor  Motor Motor: Abnormal postural alignment and control  Trunk/Postural Assessment  Cervical Assessment Cervical Assessment: Exceptions to WFL (Forward head, L lateral tilt) Thoracic Assessment Thoracic Assessment: Exceptions to [facility] (kyphosis and rounded shoulders) Lumbar Assessment Lumbar Assessment: Exceptions to [facility] (posterior pelvic tilt) Postural Control Postural Control: Deficits on evaluation Head Control: Maintained L lateral tilt during all functional mobility and while resting. Able to perform PROM to almost neutral Righting Reactions: delayed and inadequate  Balance Balance Balance Assessed: Yes Static Sitting Balance Static Sitting - Balance Support: Feet supported Static Sitting - Level of Assistance: 5: Stand by assistance Dynamic Sitting Balance Dynamic Sitting - Balance Support: Feet supported;During functional activity Dynamic Sitting - Level of Assistance: 4: Min assist Static Standing Balance Static Standing - Balance Support: During functional activity;Bilateral upper extremity supported Static Standing - Level of Assistance: 4: Min assist Dynamic Standing Balance Dynamic Standing - Balance Support: During functional activity;No upper extremity supported Dynamic Standing - Level of Assistance: 4: Min assist;3: Mod assist Extremity/Trunk Assessment RUE Assessment RUE Assessment: Within Functional Limits LUE Assessment LUE Assessment: Within Functional Limits  Care Tool Care Tool Self Care Eating   Eating Assist Level: Set up assist    Oral Care    Oral Care Assist Level: Set up assist    Bathing   Body parts bathed by patient: Right arm;Left arm;Chest;Abdomen;Front perineal area;Buttocks;Right upper leg;Left upper leg;Face;Right lower leg;Left lower leg     Assist Level: Minimal Assistance - Patient >  75%    Upper Body Dressing(including orthotics)   What is the patient wearing?: Pull over shirt;Bra   Assist Level: Set up assist    Lower Body Dressing (excluding footwear)   What is the patient wearing?: Underwear/pull up;Pants Assist for lower body dressing: Maximal Assistance - Patient 25 - 49%    Putting on/Taking off footwear   What is the patient wearing?: Non-skid slipper socks Assist for footwear: Maximal Assistance - Patient 25 - 49%       Care Tool Toileting Toileting activity   Assist for toileting: Maximal Assistance - Patient 25 - 49%  Care Tool Bed Mobility Roll left and right activity   Roll left and right assist level: Minimal Assistance - Patient > 75%    Sit to lying activity   Sit to lying assist level: Minimal Assistance - Patient > 75%    Lying to sitting edge of bed activity   Lying to sitting edge of bed assist level: Minimal Assistance - Patient > 75%     Care Tool Transfers Sit to stand transfer   Sit to stand assist level: Minimal Assistance - Patient > 75%    Chair/bed transfer   Chair/bed transfer assist level: Minimal Assistance - Patient > 75%     Toilet transfer   Assist Level: Minimal Assistance - Patient > 75%     Care Tool Cognition Expression of Ideas and Wants Expression of Ideas and Wants: Without difficulty (complex and basic) - expresses complex messages without difficulty and with speech that is clear and easy to understand   Understanding Verbal and Non-Verbal Content Understanding Verbal and Non-Verbal Content: Understands (complex and basic) - clear comprehension without cues or repetitions   Memory/Recall Ability *first 3 days only Memory/Recall Ability *first 3 days only: Current season;Location of own room;Staff names and faces;That he or she is in a hospital/hospital unit    Refer to Care Plan for [place] 1 OT Short Term Goal 1 (Week 1): Pt will ambulate to bathroom with min A to S  with RW. OT Short Term Goal 2 (Week 1): Pt will don pants over feet with S. OT Short Term Goal 3 (Week 1): Pt will be able to pull pants over hips with S in standing. OT Short Term Goal 4 (Week 1): Pt will demonstrate improved endurance to tolerate standing for 3 minutes.  Recommendations for other services: None    Skilled Therapeutic Intervention ADL ADL Eating: Set up Grooming: Setup Upper Body Bathing: Setup Lower Body Bathing: Minimal assistance Upper Body Dressing: Setup Lower Body Dressing: Maximal assistance Toileting: Maximal assistance Where Assessed-Toileting: Bedside Commode Toilet Transfer: Minimal assistance Toilet Transfer Method: Stand pivot Mobility  Bed Mobility Bed Mobility: Supine to Sit;Sit to Supine Supine to Sit: Minimal Assistance - Patient > 75% Sit to Supine: Minimal Assistance - Patient > 75% Transfers Sit to Stand: Minimal Assistance - Patient > 75% Stand to Sit: Minimal Assistance - Patient > 75%   Pt seen for initial evaluation and ADL training.  Explained role of OT, discussed OT POC and pt's goals, but it was difficulty to get through all of the evaluation as pt is hyperverbal.  For example, more assessment needs to be done to determine if pt has adequate proprioception of LLE and L visual perceptual awareness as she was dragging her L leg with transfers and her LE strength seems to be fair.  Pt completed toileting, bathing and dressing with mod -max A overall for LB self care.  Pt resting in bed at end of session with all needs met.    Discharge Criteria: Patient will be discharged from OT if patient refuses treatment 3 consecutive times without medical reason, if treatment goals not met, if there is a change in medical status, if patient makes no progress towards goals or if patient is discharged from hospital.  The above assessment, treatment plan, treatment alternatives and goals were discussed and mutually agreed upon: by  patient  [facility] 01/14/2021, 1:02 PM

## 2021-01-14 NOTE — Progress Notes (Signed)
Inpatient Rehabilitation Care Coordinator Assessment and Plan Patient Details  Name: [name] [name] MRN: [id number] Date of Birth: [birth year]  Today's Date: 01/14/2021  Hospital Problems: Principal Problem:   Brainstem infarct, acute [facility]) Active Problems:   Left pontine cerebrovascular accident [facility])   Torticollis   Essential hypertension   Acute lower UTI  Past Medical History:  Past Medical History:  Diagnosis Date  . Anxiety   . Arthritis    knees  . Breast cancer ([place]) 1999  . CHF (congestive heart failure) ([place])    Related to severe mitral regurgitation, April, 2013  . 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 Assess by pulmonary team in the hospital April, 2013  . Ejection fraction    EF 60%, echo, April, 2013, with severe MR before mitral valve replacement  . Herpes   . Hypothyroidism   . IBS (irritable bowel syndrome)   . Mitral valve regurgitation    Mitral valve replacement April, 2013, Mitral valve prolapse  . Multiple sclerosis ([place])   . Neurogenic bladder   . Osteoporosis   . Ovarian cyst   . Paroxysmal atrial fibrillation (HCC)    Rapid atrial fibrillation in-hospital, Rapid cardioversion,  before mitral valve surgery  . Pulmonary hypertension ([place])    Echo, April, 2013, before mitral valve surgery  . S/P Maze operation for atrial fibrillation 01/26/2012   Complete biatrial lesion set using cryothermy via right mini thoracotomy  . S/P mitral valve replacement 01/26/2012   59mm Sorin [employer]omed[employer] Optiform mechanical prosthesis via right mini thoracotomy  . Warfarin anticoagulation    Mechanical mitral prosthesis, April, 20136   Past Surgical History:  Past Surgical History:  Procedure Laterality Date  . BREAST LUMPECTOMY Right 1999   with sent.node, and axillary dissection (20)  . CHEST TUBE INSERTION  01/26/2012   Procedure: CHEST TUBE INSERTION;  Surgeon: Rexene Alberts, MD;  Location: [place];  Service: Open Heart  Surgery;  Laterality: Left;  . COLONOSCOPY  2010   "normal"  . CYSTOSCOPY  1992  . LAPAROSCOPIC OVARIAN CYSTECTOMY  1978   urethral stricture repair  . LEFT AND RIGHT HEART CATHETERIZATION WITH CORONARY ANGIOGRAM N/A 01/20/2012   Procedure: LEFT AND RIGHT HEART CATHETERIZATION WITH CORONARY ANGIOGRAM;  Surgeon: Burnell Blanks, MD;  Location: [facility] CATH LAB;  Service: Cardiovascular;  Laterality: N/A;  . LYMPHADENECTOMY    . MAZE  01/26/2012   Procedure: MAZE;  Surgeon: Rexene Alberts, MD;  Location: [place];  Service: Open Heart Surgery;  Laterality: N/A;  . MITRAL VALVE REPLACEMENT  01/26/2012   Procedure: MINIMALLY INVASIVE MITRAL VALVE (MV) REPLACEMENT;  Surgeon: Rexene Alberts, MD;  Location: [place];  Service: Open Heart Surgery;  Laterality: Right;  . TEE WITHOUT CARDIOVERSION  01/19/2012   Procedure: TRANSESOPHAGEAL ECHOCARDIOGRAM (TEE);  Surgeon: Peter M Martinique, MD;  Location: [facility] ENDOSCOPY;  Service: Cardiovascular;  Laterality: N/A;  . TONSILLECTOMY  1970  . [place]  . WRIST SURGERY Right 2012   Social History:  reports that she has never smoked. She has never used smokeless tobacco. She reports that she does not drink alcohol and does not use drugs.  Family / Support Systems Marital Status: Widow/Widower Patient Roles: Other (Comment) (neighbor and step mom) Other Supports: [place]-brother [phone] [facility] has daughter in-law in [place] and neighbors Anticipated Caregiver: Tommie Raymond will check daily with her and daughter in-law can come by to check on her Ability/Limitations of Caregiver: At  best intermittent checks by neighbors and daughter in-law Caregiver Availability: Intermittent Family Dynamics: Close with Aunt's in [place] and brother in [place]. She has a local ste-daughter in-law and her neibors are very supportive and involved.  Social History Preferred language: English Religion: Presbyterian Cultural Background: No  issues Education: Secretary/administrator educated Read: Yes Write: Yes Employment Status: Retired Public relations account executive Issues: No issues Guardian/Conservator: None-according to MD pt os capable of making her own decisions while here   Abuse/Neglect Abuse/Neglect Assessment Can Be Completed: Yes Physical Abuse: Denies Verbal Abuse: Denies Sexual Abuse: Denies Exploitation of patient/patient's resources: Denies Self-Neglect: Denies  Emotional Status Pt's affect, behavior and adjustment status: Pt has always been independent and taken care of herself and feels she can again. She has good movement in her extremities and only concern is her neck pain from holdng her head a certain way yesterday. She drives and is very self sufficent. Recent Psychosocial Issues: other health issues needed some assist ie getting down stairs and into car but once in can drive herself Psychiatric History: History of anxiety takes medications for this and finds them helpful. She may benefit from seeing neruo-psych while here, but seems to be coping appropriately at this time Substance Abuse History: No issues  Patient / Family Perceptions, Expectations & Goals Pt/Family understanding of illness & functional limitations: Pt is able to explain her stroke and grateful she is moving well and getting her control back, her only concern is her neck pain. She talks with the MD daily and feels she has a good understanding of her treatment going forward. Premorbid pt/family roles/activities: mother in-law, retiree, neighbor, friend, family member, etc Anticipated changes in roles/activities/participation: resume Pt/family expectations/goals: Pt states: " I plan to be able to take care of myself when I leave here."  [employer]: Other (Comment) (has a help button via cell phone server at home) Premorbid Home Care/DME Agencies: Other (Comment) (has rw, tub seat, cane and grab bars) Transportation  available at discharge: pt still drives and neighbors help her down the stairs and into the car Resource referrals recommended: Neuropsychology  Discharge Planning Living Arrangements: Alone Support Systems: Other relatives,Friends/neighbors Type of Residence: Private residence Insurance Resources: [employer] (specify) Web designer) Financial Resources: [place] Referred: No Living Expenses: Own Money Management: Patient Does the patient have any problems obtaining your medications?: No Home Management: Patient and can hire if needed Patient/Family Preliminary Plans: Return home with her neighbors checking in on her and daughter in-law coming by to see how doing and assisting with errands if needed. Aware team conference tomorrow and target discharge date wil be set. Her only concern is her neck pain. RN addressing with pain meds and heat pack Care Coordinator Barriers to Discharge: Decreased caregiver support Care Coordinator Barriers to Discharge Comments: Needs to be mod/i before going home Care Coordinator Anticipated Follow Up Needs: HH/OP  Clinical Impression Pleasant female who is very independent and has learned to adapt to her health issues to remain independent. Her neighbors do check on daily and assist her down her stairs and into her car for when she wants to drive somewhere-hair dresser & MD appointments. Will be short length of stay and will not be here long enough to see neuro-psych. Team conference tomorrow will update.  Elease Hashimoto 01/14/2021, 11:24 AM

## 2021-01-14 NOTE — Progress Notes (Signed)
[place] Individual Statement of Services  Patient Name:  [name] [name]  Date:  01/14/2021  Welcome to the [facility].  Our goal is to provide you with an individualized program based on your diagnosis and situation, designed to meet your specific needs.  With this comprehensive rehabilitation program, you will be expected to participate in at least 3 hours of rehabilitation therapies Monday-Friday, with modified therapy programming on the weekends.  Your rehabilitation program will include the following services:  Physical Therapy (PT), Occupational Therapy (OT), 24 hour per day rehabilitation nursing, Care Coordinator, Rehabilitation Medicine, Nutrition Services and Pharmacy Services  Weekly team conferences will be held on Wednesday to discuss your progress.  Your Inpatient Rehabilitation Care Coordinator will talk with you frequently to get your input and to update you on team discussions.  Team conferences with you and your family in attendance may also be held.  Expected length of stay: 21 days  Overall anticipated outcome: independent with device  Depending on your progress and recovery, your program may change. Your Inpatient Rehabilitation Care Coordinator will coordinate services and will keep you informed of any changes. Your Inpatient Rehabilitation Care Coordinator's name and contact numbers are listed  below.  The following services may also be recommended but are not provided by the [facility] will be made to provide these services after discharge if needed.  Arrangements include referral to agencies that provide these services.  Your insurance has been verified to be:  Medicare & [place] Your primary doctor is:  Howard Pouch  Pertinent information will be shared with your doctor and your insurance  company.  Inpatient Rehabilitation Care Coordinator:  Ovidio Kin, [place] or Emilia Beck  Information discussed with and copy given to patient by: Elease Hashimoto, 01/14/2021, 11:26 AM

## 2021-01-14 NOTE — IPOC Note (Signed)
Individualized overall Plan of Care [facility]) Patient Details Name: [name] [name] MRN: [id number] DOB: 1951/06/29  Admitting Diagnosis: Brainstem infarct, acute [facility])  Hospital Problems: Principal Problem:   Brainstem infarct, acute ([place]) Active Problems:   Left pontine cerebrovascular accident ([employer])   Torticollis   Essential hypertension   Acute lower UTI     Functional Problem List: Nursing Bladder,Endurance,Medication Management,Safety  PT Balance,Motor,Nutrition,Endurance,Pain,Safety,Skin Integrity  OT Balance,Endurance,Motor,Perception  SLP    TR         Basic ADL's: OT Bathing,Dressing,Toileting     Advanced  ADL's: OT Simple Meal Preparation     Transfers: PT Bed Mobility,Bed to Chair,Car,Furniture  OT Toilet,Tub/Shower     Locomotion: PT Ambulation,Stairs     Additional Impairments: OT None  SLP        TR      Anticipated Outcomes Item Anticipated Outcome  Self Feeding no goal, pt is independent  Swallowing      Basic self-care  Mod I  Toileting  Mod I   Bathroom Transfers Mod I  Bowel/Bladder  manage bladder with mod I assist  Transfers  mod I  Locomotion  mod I  Communication     Cognition     Pain     Safety/Judgment  Manage safety with cues/reminders   Therapy Plan: PT Intensity: Minimum of 1-2 x/day ,45 to 90 minutes PT Frequency: 5 out of 7 days PT Duration Estimated Length of Stay: 3 weeks OT Intensity: Minimum of 1-2 x/day, 45 to 90 minutes OT Frequency: 5 out of 7 days OT Duration/Estimated Length of Stay: 21 days      Team Interventions: Nursing Interventions Patient/Family Education,Bladder Management,Medication Management,Discharge Planning,Disease Management/Prevention  PT interventions Ambulation/gait training,Discharge planning,Functional mobility training,Psychosocial support,Therapeutic Activities,Visual/perceptual remediation/compensation,Wheelchair propulsion/positioning,Therapeutic Exercise,Skin care/wound  management,Neuromuscular re-education,Disease management/prevention,Cognitive remediation/compensation,DME/adaptive equipment instruction,Pain management,Splinting/orthotics,UE/LE Strength taining/ROM,Balance/vestibular training,Community reintegration,Patient/family education,Stair training,UE/LE Coordination activities  OT Interventions Balance/vestibular training,Discharge planning,DME/adaptive equipment instruction,Functional mobility training,Pain management,Neuromuscular re-education,Patient/family education,Psychosocial support,Self Care/advanced ADL retraining,Therapeutic Activities,Therapeutic Exercise,UE/LE Strength taining/ROM,UE/LE Coordination activities  SLP Interventions    TR Interventions    SW/CM Interventions Discharge Planning,Psychosocial Support,Patient/Family Education   Barriers to Discharge MD  Medical stability  Nursing Lack of/limited family support,Medication compliance,Home environment access/layout 2 level home with 4 step entry and 14 steps upstairs in house  PT Decreased caregiver support,Home environment access/layout,Neurogenic Bowel & Bladder,Incontinence,Lack of/limited family support,Insurance for SNF coverage Town home with full flight to 2nd floor - no family to provide 24/7  OT Decreased caregiver support pt has neighbors that check on her; dtr in Sports coach in [place]  SLP      SW Decreased caregiver support Needs to be mod/i before going home   Team Discharge Planning: Destination: PT-Home ,OT- Home , SLP-  Projected Follow-up: PT-Home health PT, OT-  Home health OT, SLP-  Projected Equipment Needs: PT-To be determined, OT- To be determined, SLP-  Equipment Details: PT-Pt owns RW and rollator and cane, OT-  Patient/family involved in discharge planning: PT- Patient,  OT-Patient, SLP-   MD ELOS: 14-17 days. Medical Rehab Prognosis:  Good Assessment: 70 year old right-handed female history of severe mitral vegetation status post metal mitral valve  replacement 2013 maintained on chronic Coumadin, PAF, hypertension, COPD, MS with neurogenic bladder, diastolic congestive heart failure, IBS.   Presented 01/11/2021 with acute onset of right side weakness and aphasia.  Cranial CT scan showed no acute intracranial hemorrhage or evidence of acute infarction.  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  No hemodynamically significant stenosis.  Patient did not receive TPA.  MRI showed a wedge-shaped area of diffusion on the left paramedian pons suspicious for acute infarction.  Stable burden of chronic demyelinating disease.  No evidence of active demyelination.  MRI cervical spine no cord signal abnormality or impingement.  Admission chemistries unremarkable except BUN 27 glucose 143 INR 2.2 hemoglobin 14.3, urinalysis positive nitrite.  Echocardiogram with ejection fraction of 65 to 70% no wall motion abnormalities.  Patient presently remains on Coumadin as prior to admission with the addition of low-dose aspirin.  Presently maintained on Rocephin for UTI. Tolerating a regular consistency diet.  Therapy evaluations completed due to patient's right side weakness and aphasia patient was admitted for a comprehensive rehab program. Will set goals for Mod I with PT/OT.  Due to the current state of emergency, patients may not be receiving their 3-hours of Medicare-mandated therapy.  See Team Conference Notes for weekly updates to the plan of care

## 2021-01-14 NOTE — Progress Notes (Signed)
Pt c/o frequent voiding, w/small amounts each time. Pt states " I am incontinent, I can only tell once it is coming out.". this nurse scanned the pts bladder after attempt to void. Pts bladder showed 450 ml. Pt requesting other options besides in/out cath. Provider will be made aware.

## 2021-01-15 DIAGNOSIS — I639 Cerebral infarction, unspecified: Secondary | ICD-10-CM

## 2021-01-15 LAB — COMPREHENSIVE METABOLIC PANEL
ALT: 16 U/L (ref 0–44)
AST: 17 U/L (ref 15–41)
Albumin: 2.6 g/dL — ABNORMAL LOW (ref 3.5–5.0)
Alkaline Phosphatase: 57 U/L (ref 38–126)
Anion gap: 6 (ref 5–15)
BUN: 20 mg/dL (ref 8–23)
CO2: 30 mmol/L (ref 22–32)
Calcium: 9.3 mg/dL (ref 8.9–10.3)
Chloride: 103 mmol/L (ref 98–111)
Creatinine, Ser: 0.79 mg/dL (ref 0.44–1.00)
GFR, Estimated: >60 mL/min (ref >60)
Glucose, Bld: 105 mg/dL — ABNORMAL HIGH (ref 70–99)
Potassium: 3.8 mmol/L (ref 3.5–5.1)
Sodium: 139 mmol/L (ref 135–145)
Total Bilirubin: 0.8 mg/dL (ref 0.3–1.2)
Total Protein: 5.7 g/dL — ABNORMAL LOW (ref 6.5–8.1)

## 2021-01-15 LAB — PROTIME-INR
INR: 3.1 — ABNORMAL HIGH (ref 0.8–1.2)
Prothrombin Time: 31 seconds — ABNORMAL HIGH (ref 11.4–15.2)

## 2021-01-15 MED ORDER — WARFARIN SODIUM 5 MG PO TABS
5.0000 mg | ORAL_TABLET | Freq: Once | ORAL | Status: AC
  Administered 2021-01-15: 5 mg via ORAL
  Filled 2021-01-15: qty 1

## 2021-01-15 MED ORDER — BOOST PLUS PO LIQD
237.0000 mL | Freq: Three times a day (TID) | ORAL | Status: DC
  Administered 2021-01-15 (×2): 237 mL via ORAL
  Filled 2021-01-15 (×6): qty 237

## 2021-01-15 NOTE — Progress Notes (Signed)
[facility] PHYSICAL MEDICINE & REHABILITATION PROGRESS NOTE  Subjective/Complaints:  Patient seen sitting up in bed this AM, working with therapies.  She states she slept well overnight with diaper.  She had a good first day of therapies. She has questions about nutritional supplement.  She has questions regarding bladder management.  ROS: Denies CP, SOB, N/V/D  Objective: Vital Signs: Blood pressure (!) 148/84, pulse 81, temperature 98.1 F (36.7 C), resp. rate 18, height 5' 6.5" (1.689 m), weight 64 kg, SpO2 94 %. No results found. Recent Labs    01/13/21 0502  WBC 6.0  HGB 12.6  HCT 36.8  PLT 211   Recent Labs    01/15/21 0525  NA 139  K 3.8  CL 103  CO2 30  GLUCOSE 105*  BUN 20  CREATININE 0.79  CALCIUM 9.3    Intake/Output Summary (Last 24 hours) at 01/15/2021 0949 Last data filed at 01/15/2021 0909 Gross per 24 hour  Intake 416 ml  Output 453 ml  Net -37 ml        Physical Exam: BP (!) 148/84 (BP Location: Left Arm)   Pulse 81   Temp 98.1 F (36.7 C)   Resp 18   Ht 5' 6.5" (1.689 m)   Wt 64 kg   SpO2 94%   BMI 22.43 kg/m  Constitutional: No distress . Vital signs reviewed. HENT: Normocephalic.  Atraumatic. Eyes: EOMI. No discharge. Cardiovascular: No JVD.  RRR. Respiratory: Normal effort.  No stridor.  Bilateral clear to auscultation. GI: Non-distended.  BS +. Skin: Warm and dry.  Intact. Psych: Normal mood.  Normal behavior. Musc: No edema in extremities.  No tenderness in extremities. Cervical kyphosis Neurologic: Alert Motor: 4/5 throughout, right slightly weaker than left + Torticollis, unchanged  Assessment/Plan: 1. Functional deficits which require 3+ hours per day of interdisciplinary therapy in a comprehensive inpatient rehab setting.  Physiatrist is providing close team supervision and 24 hour management of active medical problems listed below.  Physiatrist and rehab team continue to assess barriers to discharge/monitor patient  progress toward functional and medical goals   Care Tool:  Bathing    Body parts bathed by patient: Right arm,Left arm,Chest,Abdomen,Front perineal area,Buttocks,Right upper leg,Left upper leg,Face,Right lower leg,Left lower leg         Bathing assist Assist Level: Minimal Assistance - Patient > 75%     Upper Body Dressing/Undressing Upper body dressing   What is the patient wearing?: Pull over shirt    Upper body assist Assist Level: Set up assist    Lower Body Dressing/Undressing Lower body dressing      What is the patient wearing?: Underwear/pull up,Pants     Lower body assist Assist for lower body dressing: Maximal Assistance - Patient 25 - 49%     Toileting Toileting    Toileting assist Assist for toileting: Maximal Assistance - Patient 25 - 49%     Transfers Chair/bed transfer  Transfers assist  Chair/bed transfer activity did not occur: Safety/medical concerns  Chair/bed transfer assist level: Minimal Assistance - Patient > 75%     Locomotion Ambulation   Ambulation assist      Assist level: Minimal Assistance - Patient > 75% Assistive device: Walker-rolling Max distance: 10   Walk 10 feet activity   Assist     Assist level: Minimal Assistance - Patient > 75% Assistive device: Walker-rolling   Walk 50 feet activity   Assist Walk 50 feet with 2 turns activity did not occur: Safety/medical concerns  Walk 150 feet activity   Assist Walk 150 feet activity did not occur: Safety/medical concerns         Walk 10 feet on uneven surface  activity   Assist Walk 10 feet on uneven surfaces activity did not occur: Safety/medical concerns         Wheelchair     Assist Will patient use wheelchair at discharge?: No   Wheelchair activity did not occur: N/A         Wheelchair 50 feet with 2 turns activity    Assist    Wheelchair 50 feet with 2 turns activity did not occur: N/A       Wheelchair 150  feet activity     Assist  Wheelchair 150 feet activity did not occur: N/A        Medical Problem List and Plan: 1.Right-sided weakness with aphasiasecondary to left pontine left cerebellum CVA  Continue CIR   Team conference today to discuss current and goals and coordination of care, home and environmental barriers, and discharge planning with nursing, case manager, and therapies. Please see conference note from today as well.  2. Antithrombotics: -DVT/anticoagulation:Coumadin (2.5-3.5)  INR therapeutic on 3/30 -antiplatelet therapy: Aspirin 81 mg daily 3. Pain Management:Tylenol as needed 4. Mood:Provide emotional support -antipsychotic agents: N/A 5. Neuropsych: This patientiscapable of making decisions on hisown behalf. 6. Skin/Wound Care:Routine skin checks 7. Fluids/Electrolytes/Nutrition:Routine in and outs  BMP within acceptable range on 3/30 8. UTI.   Urinalysis positive nitrite, Urine culture showing multiple species  Rocephin transition to Augmentin through 4/1 9. Hypertension. Lopressor 25 mg twice daily.   Relatively controlled on 3/30  Monitor with increased mobility 10. Hypothyroidism. TSH 0.371. Continue thyroid 90 mg daily 11. Hyperlipidemia. Lipitor 12. History of severe mitral regurgitation status post metal mitral valve replacement 2013. Continue chronic Coumadin. 13. Diastolic congestive heart failure. Monitor for any signs of fluid overload Filed Weights   01/13/21 1700  Weight: 64 kg   14. COPD. Check oxygen saturations every shift 15. 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 Patient has never been on modifying therapy due to concerns for potential side effects but has not had any reported documented MS flare since diagnosis.  Patient states she urinates every 5 minutes and wears depends at home, which were not available to her, daughter-in-law to bring in  Pure wick nightly 16.   Paroxysmal Atrial Fibrillation- lopressor, cont Warfarin  Controlled on 3/30 17.  Torticollis with cervical kyphosis  Patient wary of injections, and notes she has grown accustomed to it, is willing to work with therapies to improve range of motion   Discussed with therapies ROM   LOS: 2 days A FACE TO FACE EVALUATION WAS PERFORMED  Airik Goodlin Lorie Phenix 01/15/2021, 9:49 AM

## 2021-01-15 NOTE — Patient Care Conference (Signed)
Inpatient RehabilitationTeam Conference and Plan of Care Update Date: 01/15/2021   Time: 11:12 AM    Patient Name: [name] [name]      Medical Record Number: [id number]  Date of Birth: 01-10-1951 Sex: Female         Room/Bed: 4M07C/4M07C-01 Payor Info: Payor: MEDICARE / Plan: MEDICARE PART A AND B / Product Type: *No Product type* /    Admit Date/Time:  01/13/2021  4:56 PM  Primary Diagnosis:  Brainstem infarct, acute [facility])  Hospital Problems: Principal Problem:   Brainstem infarct, acute ([place]) Active Problems:   Left pontine cerebrovascular accident [facility])   Torticollis   Essential hypertension   Acute lower UTI    Expected Discharge Date: Expected Discharge Date: 01/30/21  Team Members Present: Physician leading conference: Dr. Delice Lesch Care Coodinator Present: Dorien Chihuahua, RN, BSN, CRRN;Becky Dupree, LCSW Nurse Present: Dorien Chihuahua, RN PT Present: Ginnie Smart, PT OT Present: Meriel Pica, OT PPS Coordinator present : Gunnar Fusi, Novella Olive, PT     Current Status/Progress Goal Weekly Team Focus  Bowel/Bladder   Pt is cont/incont x2. Pt has urgency and frequency when voiding. Pt also having PVR volumes higher than 350 ml, provider made aware. LBM 01/13/21  Pt will be contx2  A2h toileting/PRN, PVR >350   Swallow/Nutrition/ Hydration             ADL's   min A transfers and sit to stand, mod to max with LB self care; limited endurance and LB strength  Mod I with BASIC ADLs  ADL training, balance, endurance, pt education   Mobility   minA bed mobility, minA transfers, minA gait ~54f with RW.  mod I (except stairs, supervision)  functional transfers, gait, general strengthening, balance, pt education   Communication             Safety/Cognition/ Behavioral Observations            Pain   Pt c/o neck and back pain. PRN tylenol effective  Pt will be free of pain  Assess pain qshift   Skin   pt has no obvious signs of breakdown or infection  Pts  skin will be free of infection and no breakdown.  Assess skin qshift/prn     Discharge Planning:  Home with intermittent assist from neighbors and daughter in-law to check on. Needs to be mod/i to return home safely   Team Discussion: Neurogenic bladder at baseline 2/2 MS and two more days of abx for ? UTI.   HF with chronic coumadin for MMR.   Patient on target to meet rehab goals: yes, slow steady progress. Patient is verbose and gets side tracked. Also limited by kyphosis and postural abnormalities with premorbid "wonky" gait. Currently min  Assist overall with mod I goals set for discharge.  *See Care Plan and progress notes for long and short-term goals.   Revisions to Treatment Plan:   Teaching Needs:   Current Barriers to Discharge: Decreased caregiver support, Home enviroment access/layout and Neurogenic bowel and bladder Home alone PTA; neighbors check in and assist down steps to car; patient was still driving  Possible Resolutions to Barriers: Recommend supervision for safety Recommend no driving due to poor safety with physical limitations, etc.     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  Barriers to Discharge: Medical stability;Decreased family/caregiver support   Possible Resolutions to [employer]: Therapies, ROM for torticollis, diapers for neuogenic bladder, abx for UTI,  follow INR   Continued Need for Acute Rehabilitation Level of Care: The patient requires daily medical management by a physician with specialized training in physical medicine and rehabilitation for the following reasons: Direction of a multidisciplinary physical rehabilitation program to maximize functional independence : Yes Medical management of patient stability for increased activity during participation in an intensive rehabilitation regime.: Yes Analysis of laboratory values and/or radiology reports with any  subsequent need for medication adjustment and/or medical intervention. : Yes   I attest that I was present, lead the team conference, and concur with the assessment and plan of the team.   Margarito Liner 01/15/2021, 3:48 PM

## 2021-01-15 NOTE — Progress Notes (Signed)
ANTICOAGULATION CONSULT NOTE  Pharmacy Consult for Warfarin Indication: mechanical MVR  Allergies  Allergen Reactions  . Erythromycin Nausea And Vomiting  . Valsartan Other (See Comments)    Pt reports caused her depression    Patient Measurements: Height: 5' 6.5" (168.9 cm) Weight: 64 kg (141 lb 1.5 oz) IBW/kg (Calculated) : 60.45  Vital Signs: Temp: 98.1 F (36.7 C) (03/30 0528) BP: 148/84 (03/30 0528) Pulse Rate: 81 (03/30 0528)  Labs: Recent Labs    01/13/21 0502 01/14/21 0513 01/15/21 0525  HGB 12.6  --   --   HCT 36.8  --   --   PLT 211  --   --   LABPROT 31.6* 29.6* 31.0*  INR 3.2* 2.9* 3.1*  CREATININE  --   --  0.79    Estimated Creatinine Clearance: 61.6 mL/min (by C-G formula based on SCr of 0.79 mg/dL).   Assessment: Patient is a 70 yo F that presented to the ED with concerns for a stroke.  Transferred to inpatient rehab 3/28.  The patient is on warfarin PTA for a mechanical MVR with an INR goal of 2.5-3.5.   PTA Regimen: 7.5mg  M /Thur and 5mg  all other days  INR is therapeutic at 3.1 today. CBC stable, no bleeding noted.  Goal of Therapy:  INR 2.5-3.5 Monitor platelets by anticoagulation protocol: Yes   Plan:  - Warfarin 5 mg PO tonight  - Monitor daily INR and CBC - Monitor patient for s/s of bleeding   Thank you for involving pharmacy in this patient's care.  Horton Chin, PharmD, BCPS Clinical Pharmacist Clinical phone for 01/15/2021 until 3p is [phone] 01/15/2021 12:10 PM  **Pharmacist phone directory can be found on [email or web address] listed under [place]**

## 2021-01-15 NOTE — Progress Notes (Signed)
Patient ID: [name] [name], female   DOB: 1951-09-30, 70 y.o.   MRN: [id number]  Met with pt to discus team conference goals mod/i level and target discharge date 4/14. Made aware if progresses more quickly can move up discharge date. She feels she is doing better today than yesterday in therapies. She remains hopeful she will do well here. Continue to work on discharge needs.

## 2021-01-15 NOTE — Plan of Care (Signed)
  Problem: Consults Goal: RH STROKE PATIENT EDUCATION Description: See Patient Education module for education specifics  Outcome: Progressing   Problem: RH BLADDER ELIMINATION Goal: RH STG MANAGE BLADDER WITH ASSISTANCE Description: STG Manage Bladder With mod I Assistance Outcome: Progressing   Problem: RH SAFETY Goal: RH STG ADHERE TO SAFETY PRECAUTIONS W/ASSISTANCE/DEVICE Description: STG Adhere to Safety Precautions With cues/reminders Assistance/Device. Outcome: Progressing   Problem: RH KNOWLEDGE DEFICIT Goal: RH STG INCREASE KNOWLEDGE OF HYPERTENSION Description: Patient will be able to manage HTN with medications and dietary modifications using handouts and educational materials independently Outcome: Progressing Goal: RH STG INCREASE KNOWLEGDE OF HYPERLIPIDEMIA Description: Patient will be able to manage HLD  with medications and dietary modifications using handouts and educational materials independently Outcome: Progressing Goal: RH STG INCREASE KNOWLEDGE OF STROKE PROPHYLAXIS Description: Patient will be able to manage secondary stroke risks with medications and dietary modifications using handouts and educational materials independently Outcome: Progressing

## 2021-01-15 NOTE — Progress Notes (Signed)
Physical Therapy Session Note  Patient Details  Name: [name] [name] MRN: [id number] Date of Birth: [birth year]  Today's Date: 01/15/2021 PT Individual Time: 1245-8099 + 8338-2505 PT Individual Time Calculation (min): 59 min   + 28 min  Short Term Goals: Week 1:  PT Short Term Goal 1 (Week 1): Pt will complete bed mobility without bed features with supervision PT Short Term Goal 2 (Week 1): Pt will complete bed<>chair transfers with CGA and LRAD PT Short Term Goal 3 (Week 1): Pt will ambulate 51ft with CGA and LRAD PT Short Term Goal 4 (Week 1): Pt will initiate stair training  Skilled Therapeutic Interventions/Progress Updates:     1st session: Pt greeted supine in bed, pt agreeable to therapy with no reports of pain. Reports well rested night and also requests help with getting dressed as she was incontinent of bladder (neurogenic, uses briefs at home for management). Supine<>sit with CGA with HOB fully elevated, able to scoot towards EOB with supervision and extra time. MD arriving for morning rounds. Sit<>stand requiring minA from elevated bed height to RW - needs extra time to achieve full upright with excessive hip/flexion. Ambulated with CGA and RW to her bathroom - gait deficits include excessive B foot pronation, knee valgus, posterior pelvic tilt, and torticollis of her neck (L lateral flexion/R cervical rotation). Required minA for controlled lowering to 3-1 BSC over toilet and pt was able to be continent of bladder. She requested privacy and reports she was able to perform pericare indep (unable to verify). Sit<>stand with minA to RW and pt able to pull her pants/briefs over hips with CGA for balance. Ambulated back to her bed, ~64ft, with CGA and RW with similar gait deficits outlined above. She was able to remove dirty shirt with supervision while seated EOB and donned a bra and t-shirt with setupA. She was also able to don her sweat pants with setupA while seated EOB. Sit<>stand with  minA to RW and focused remainder of session on functional gait training. She ambulated ~72ft (from her room to ortho gym) with CGA and RW - verbal cues for correcting gait deficits and focusing on postural awareness and gait efficiency (gait speed <0.2 m/s, indicative of increased falls risk). Returned to her room with totalA in w/c, agreeable to remain sitting in w/c at end of session. Chair alarm in place, needs within reach.   2nd session: Pt received sitting in w/c, agreeable to therapy - no reports of pain. W/c transport to ortho gym for time management. Pt quite verbose during session, making it difficult to attend to task and be efficient with functional mobility, benefits from gentle redirections.  Stand<>pivot transfer with minA and RW to Nustep. Completed 59min of Nustep at workload of 4, using both BUE/BLE for reciproal patterns and focusing on general strengthening, reports 13/20 on BERG for RPE. Stand<>pivot with minA and RW back to her w/c and returned to her room as pt requesting to lay down at end of session. Stand<>pivot with minA and RW back to bed, able to complete sit>supine with supervision with bed features. She ended session supine in bed with bed alarm on and needs within reach.   Therapy Documentation Precautions:  Precautions Precautions: Fall Precaution Comments: Neurogenic bladder - incontinent (premorbid) Restrictions Weight Bearing Restrictions: No General:    Therapy/Group: Individual Therapy  Atlee Kluth P Loretto Belinsky PT 01/15/2021, 7:23 AM

## 2021-01-15 NOTE — Progress Notes (Signed)
Occupational Therapy Session Note  Patient Details  Name: [name] [name] MRN: [id number] Date of Birth: 09-Dec-1950  Today's Date: 01/15/2021 OT Individual Time: 1300-1405 OT Individual Time Calculation (min): 65 min    Short Term Goals: Week 1:  OT Short Term Goal 1 (Week 1): Pt will ambulate to bathroom with min A to S with RW. OT Short Term Goal 2 (Week 1): Pt will don pants over feet with S. OT Short Term Goal 3 (Week 1): Pt will be able to pull pants over hips with S in standing. OT Short Term Goal 4 (Week 1): Pt will demonstrate improved endurance to tolerate standing for 3 minutes.  Skilled Therapeutic Interventions/Progress Updates:    Pt seen this session to work on balance, LLE coordination and endurance with multiple transfers from recliner to wc to arm chair with RW. Overall, she only needed min A demonstrating improved coordination of left leg. Pt states she does have some sensory loss ever since her MS episode several years ago.    Sat in regular arm chair to work on chest neck and upper back stretches using a dowel bar for UE stretches. Pt did well with these exercises. Discussed concerns with pt driving due to neck ROM limitations. 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 Suggested she have a friend take her.     Pt returned to room and resting in wc with all needs met.   Therapy Documentation Precautions:  Precautions Precautions: Fall Precaution Comments: Neurogenic bladder - incontinent (premorbid) Restrictions Weight Bearing Restrictions: No  Pain: Pain Assessment Pain Score: 2  - neck - Rn aware ADL: ADL Eating: Set up Grooming: Setup Upper Body Bathing: Setup Lower Body Bathing: Minimal assistance Upper Body Dressing: Setup Lower Body Dressing: Maximal assistance Toileting: Maximal assistance Where Assessed-Toileting: Bedside Commode Toilet Transfer: Minimal assistance Toilet Transfer Method: Stand pivot  Therapy/Group:  Individual Therapy  [place] 01/15/2021, 1:01 PM

## 2021-01-16 DIAGNOSIS — I480 Paroxysmal atrial fibrillation: Secondary | ICD-10-CM

## 2021-01-16 LAB — PROTIME-INR
INR: 3.1 — ABNORMAL HIGH (ref 0.8–1.2)
Prothrombin Time: 31.1 seconds — ABNORMAL HIGH (ref 11.4–15.2)

## 2021-01-16 MED ORDER — ENSURE ENLIVE PO LIQD
237.0000 mL | Freq: Two times a day (BID) | ORAL | Status: DC
  Administered 2021-01-17 – 2021-01-29 (×22): 237 mL via ORAL
  Filled 2021-01-16 (×3): qty 237

## 2021-01-16 MED ORDER — WARFARIN SODIUM 7.5 MG PO TABS
7.5000 mg | ORAL_TABLET | Freq: Once | ORAL | Status: AC
  Administered 2021-01-16: 7.5 mg via ORAL
  Filled 2021-01-16: qty 1

## 2021-01-16 NOTE — Progress Notes (Signed)
Occupational Therapy Session Note  Patient Details  Name: [name] [name] MRN: [id number] Date of Birth: 1951-03-04  Today's Date: 01/16/2021 OT Individual Time: 1300-1340 OT Individual Time Calculation (min): 40 min    Short Term Goals: Week 1:  OT Short Term Goal 1 (Week 1): Pt will ambulate to bathroom with min A to S with RW. OT Short Term Goal 2 (Week 1): Pt will don pants over feet with S. OT Short Term Goal 3 (Week 1): Pt will be able to pull pants over hips with S in standing. OT Short Term Goal 4 (Week 1): Pt will demonstrate improved endurance to tolerate standing for 3 minutes.  Skilled Therapeutic Interventions/Progress Updates:    Pt resting in bed upon arrival and agreeable to getting OOB for therapy. Bed mobility with supervision using bed functions. Sit<>stand and amb with RW to w/c with supervision. Discussed home setup and home bed. Pt's feet unable to reach floor when sitting EOB. Pt transported to gym and therapy mat raised to simulate personal bed height. Pt competed sit<>supine with supervision. Discussed shower arrangement at home. Shower and toilet in separate area and pt unsure if her Rollator will fit into space. PTA she was "wall surfing" to access shower.  Discussed safety concerns. Pt states that previous home health OT had "checked her off" on that technique. Recommended that she have HHOT assess after discharge. Pt returned to room and amb with RW from doorway to bed. Pt remained in bed with all needs within reach and bed alarm activated.   Therapy Documentation Precautions:  Precautions Precautions: Fall Precaution Comments: Neurogenic bladder - incontinent (premorbid) Restrictions Weight Bearing Restrictions: No  Pain:  Pt states her neck feels much better   Therapy/Group: Individual Therapy  Leroy Libman 01/16/2021, 2:47 PM

## 2021-01-16 NOTE — Progress Notes (Signed)
Physical Therapy Session Note  Patient Details  Name: [name] [name] MRN: [id number] Date of Birth: 1951-06-16  Today's Date: 01/16/2021 PT Individual Time: 3557-3220 + 1430-1530 PT Individual Time Calculation (min): 56 min  + 60 min  Short Term Goals: Week 1:  PT Short Term Goal 1 (Week 1): Pt will complete bed mobility without bed features with supervision PT Short Term Goal 2 (Week 1): Pt will complete bed<>chair transfers with CGA and LRAD PT Short Term Goal 3 (Week 1): Pt will ambulate 70ft with CGA and LRAD PT Short Term Goal 4 (Week 1): Pt will initiate stair training  Skilled Therapeutic Interventions/Progress Updates:     1st session: Pt greeted supine in bed, awake and agreeable to therapy - no reports of pain. Pt requesting assistance to use the bathroom and get dressed. Supine<>sit with supervision with use of bed features. Sit<>Stand with CGA to RW from EOB and ambulated with CGA and RW to her bathroom - cues for postural awareness and RW management. She's able to lower briefs in standing with CGA for balance, continent of bladder while on toilet. Sit<>Stand with CGA and RW and ambulated back to her bed in similar fashion as above. Performed upper and lower body dressing with mostly setupA (including donning t-shirt, bra, pull-ups, sweat pants, socks, and tennis shoes). She needed minA for socks/shoes. Stand<>pivot transfer with CGA and RW to w/c and w/c transport to main rehab gym for time management. Focused remainder of session on stair training. She navigated up/down 2x4 steps (seated rest) with minA and 2 hand rails - self selected step-to pattern with R foot leading ascent. Slowed and effortful but no formal LOB or knee buckling. Returned to her room with [name] in w/c for time management, pt requesting to get back into the bed. Stand<>pivot transfer with minA and no AD from w/c to EOB, cues needed for general sequencing and safety approach. She was able to remove shoes and socks  with minA and then performed sit>supine with supervision with use of bed features. Remained supine in bed with needs in reach, bed alarm on.   2nd session: Pt received supine in bed, agreeable to therapy - no reports of pain. Supine<>sit with supervision with use of bed features. Donned shoes/socks with totala for time management. Sit<>stand with CGA from EOB to RW - ambulated ~171ft with CGA and RW - cues for postural awareness, increasing L step length, proximity to RW - no knee buckling or LOB but unsteadiness increases with gait distance. Seated rest break prior to completing the following there-ex in // bars with mirror for visual feedback: -1x20 standing hip abduction, bilaterally -1x10 standing hip extension, bilaterally -1x20 mini squats -1x15 step up/downs onto 6inch platform  Then completed NMR while standing on upright Bosu ball, floating hands above // bars for 3-5 seconds at a time, needing minA for balance. Working on ankle/hip strategies.  Returned to her room with totalA in w/c, pt requesting to use bathroom and change her brief. Ambulated ~24ft within her room with CGA and RW and stand>sit with CGA to 3-1 BSC over toilet. Removed dirty brief and provided new one (neurogenic bladder, incontinent). For time management, assisted with lower body dressing while doing this. Ambulated back to her bed with CGA and RW. Able to perform bed mobility with supervision with bed features. Remained supine in bed at end of session with bed alarm on and needs within reach.   Therapy Documentation Precautions:  Precautions Precautions: Fall Precaution Comments: Neurogenic  bladder - incontinent (premorbid) Restrictions Weight Bearing Restrictions: No General:    Therapy/Group: Individual Therapy  Jachai Okazaki P Aneesa Romey PT 01/16/2021, 7:38 AM

## 2021-01-16 NOTE — Progress Notes (Signed)
ANTICOAGULATION CONSULT NOTE  Pharmacy Consult for Warfarin Indication: mechanical MVR  Allergies  Allergen Reactions  . Erythromycin Nausea And Vomiting  . Valsartan Other (See Comments)    Pt reports caused her depression    Patient Measurements: Height: 5' 6.5" (168.9 cm) Weight: 64 kg (141 lb 1.5 oz) IBW/kg (Calculated) : 60.45  Vital Signs: Temp: 97.8 F (36.6 C) (03/31 1000) BP: 139/73 (03/31 1000) Pulse Rate: 80 (03/31 1000)  Labs: Recent Labs    01/14/21 0513 01/15/21 0525 01/16/21 0455  LABPROT 29.6* 31.0* 31.1*  INR 2.9* 3.1* 3.1*  CREATININE  --  0.79  --     Estimated Creatinine Clearance: 61.6 mL/min (by C-G formula based on SCr of 0.79 mg/dL).   Assessment: Patient is a 70 yo F that presented to the ED with concerns for a stroke.  Transferred to inpatient rehab 3/28.  The patient is on warfarin PTA for a mechanical MVR with an INR goal of 2.5-3.5.   PTA Regimen: 7.5mg  M /Thur and 5mg  all other days  INR is therapeutic at 3.1 today. CBC stable, no bleeding noted.  Goal of Therapy:  INR 2.5-3.5 Monitor platelets by anticoagulation protocol: Yes   Plan:  - Warfarin 7.5 mg PO tonight per home regimen - Monitor daily INR and CBC - Monitor patient for s/s of bleeding   Thank you for involving pharmacy in this patient's care.  Horton Chin, PharmD, BCPS Clinical Pharmacist Clinical phone for 01/16/2021 until 3p is [phone] 01/16/2021 1:07 PM  **Pharmacist phone directory can be found on [email or web address] listed under [place]**

## 2021-01-16 NOTE — Progress Notes (Signed)
[facility] PHYSICAL MEDICINE & REHABILITATION PROGRESS NOTE  Subjective/Complaints:  Patient seen sitting up in bed this morning.  She states she slept well overnight.  She has questions regarding her heart rate this morning.  She denies any symptoms.  ROS: Denies CP, SOB, N/V/D  Objective: Vital Signs: Blood pressure (!) 149/55, pulse 62, temperature 98.7 F (37.1 C), resp. rate 18, height 5' 6.5" (1.689 m), weight 64 kg, SpO2 100 %. No results found. No results for input(s): WBC, HGB, HCT, PLT in the last 72 hours. Recent Labs    01/15/21 0525  NA 139  K 3.8  CL 103  CO2 30  GLUCOSE 105*  BUN 20  CREATININE 0.79  CALCIUM 9.3    Intake/Output Summary (Last 24 hours) at 01/16/2021 7001 Last data filed at 01/15/2021 2229 Gross per 24 hour  Intake 536 ml  Output --  Net 536 ml        Physical Exam: BP (!) 149/55 (BP Location: Left Arm)   Pulse 62 Comment: rechecked by [name] Meeker RN  Temp 98.7 F (37.1 C)   Resp 18   Ht 5' 6.5" (1.689 m)   Wt 64 kg   SpO2 100%   BMI 22.43 kg/m  Constitutional: No distress . Vital signs reviewed. HENT: Normocephalic.  Atraumatic. Eyes: EOMI. No discharge. Cardiovascular: No JVD.  RRR. Respiratory: Normal effort.  No stridor.  Bilateral clear to auscultation. GI: Non-distended.  BS +. Skin: Warm and dry.  Intact. Psych: Normal mood.  Normal behavior. Musc: No edema in extremities.  No tenderness in extremities. Cervical kyphosis Neurologic: Alert Motor: 4/5 throughout, right slightly weaker than left + Torticollis, stable  Assessment/Plan: 1. Functional deficits which require 3+ hours per day of interdisciplinary therapy in a comprehensive inpatient rehab setting.  Physiatrist is providing close team supervision and 24 hour management of active medical problems listed below.  Physiatrist and rehab team continue to assess barriers to discharge/monitor patient progress toward functional and medical goals   Care  Tool:  Bathing    Body parts bathed by patient: Right arm,Left arm,Chest,Abdomen,Front perineal area,Buttocks,Right upper leg,Left upper leg,Face,Right lower leg,Left lower leg         Bathing assist Assist Level: Minimal Assistance - Patient > 75%     Upper Body Dressing/Undressing Upper body dressing   What is the patient wearing?: Pull over shirt    Upper body assist Assist Level: Set up assist    Lower Body Dressing/Undressing Lower body dressing      What is the patient wearing?: Underwear/pull up,Pants     Lower body assist Assist for lower body dressing: Maximal Assistance - Patient 25 - 49%     Toileting Toileting    Toileting assist Assist for toileting: Maximal Assistance - Patient 25 - 49%     Transfers Chair/bed transfer  Transfers assist  Chair/bed transfer activity did not occur: Safety/medical concerns  Chair/bed transfer assist level: Minimal Assistance - Patient > 75%     Locomotion Ambulation   Ambulation assist      Assist level: Minimal Assistance - Patient > 75% Assistive device: Walker-rolling Max distance: 10   Walk 10 feet activity   Assist     Assist level: Minimal Assistance - Patient > 75% Assistive device: Walker-rolling   Walk 50 feet activity   Assist Walk 50 feet with 2 turns activity did not occur: Safety/medical concerns         Walk 150 feet activity   Assist Walk 150  feet activity did not occur: Safety/medical concerns         Walk 10 feet on uneven surface  activity   Assist Walk 10 feet on uneven surfaces activity did not occur: Safety/medical concerns         Wheelchair     Assist Will patient use wheelchair at discharge?: No   Wheelchair activity did not occur: N/A         Wheelchair 50 feet with 2 turns activity    Assist    Wheelchair 50 feet with 2 turns activity did not occur: N/A       Wheelchair 150 feet activity     Assist  Wheelchair 150 feet  activity did not occur: N/A        Medical Problem List and Plan: 1.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 Antithrombotics: -DVT/anticoagulation:Coumadin (2.5-3.5)  INR therapeutic on 3/31 -antiplatelet therapy: Aspirin 81 mg daily 3. Pain Management:Tylenol as needed 4. Mood:Provide emotional support -antipsychotic agents: N/A 5. Neuropsych: This patientiscapable of making decisions on hisown behalf. 6. Skin/Wound Care:Routine skin checks 7. Fluids/Electrolytes/Nutrition:Routine in and outs  BMP within acceptable range on 3/30 8. UTI.   Urinalysis positive nitrite, Urine culture showing multiple species  Rocephin transition to Augmentin through 4/1 9. Hypertension. Lopressor 25 mg twice daily.   Controlled on 3/31  Monitor with increased mobility 10. Hypothyroidism. TSH 0.371. Continue thyroid 90 mg daily 11. Hyperlipidemia. Lipitor 12. History of severe mitral regurgitation status post metal mitral valve replacement 2013. Continue chronic Coumadin. 13. Diastolic congestive heart failure. Monitor for any signs of fluid overload Filed Weights   01/13/21 1700  Weight: 64 kg   14. COPD. Check oxygen saturations every shift  To monitor with increased exertion 15. 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 Patient has never been on modifying therapy due to concerns for potential side effects but has not had any reported documented MS flare since diagnosis.  Patient states she urinates every 5 minutes and wears depends at home, which were not available to her, daughter-in-law to bring in  Pure wick nightly 16.  Paroxysmal Atrial Fibrillation- lopressor, cont Warfarin  Patient states she has had a maze, ablation in the past and notes since that time she has only been in atrial fibrillation 1 time and that was in 2015.  Heart rate of 38 recorded, likely  erroneous. 17.  Torticollis with cervical kyphosis  Patient wary of injections, and notes she has grown accustomed to it, is willing to work with therapies to improve range of motion   Discussed with therapies ROM   LOS: 3 days A FACE TO FACE EVALUATION WAS PERFORMED  [name] [name] [name] [name] 01/16/2021, 9:22 AM

## 2021-01-16 NOTE — Progress Notes (Signed)
Physical Therapy Session Note  Patient Details  Name: [name] [name] MRN: [id number] Date of Birth: 1951/02/08  Today's Date: 01/15/2021 PT Individual Time:  0933-1030  PT Individual Time Calculation (min): 32 min     Short Term Goals: Week 1:  PT Short Term Goal 1 (Week 1): Pt will complete bed mobility without bed features with supervision PT Short Term Goal 2 (Week 1): Pt will complete bed<>chair transfers with CGA and LRAD PT Short Term Goal 3 (Week 1): Pt will ambulate 50ft with CGA and LRAD PT Short Term Goal 4 (Week 1): Pt will initiate stair training  Skilled Therapeutic Interventions/Progress Updates:   Patient supine in bed upon PT arrival. Patient alert and agreeable to PT session. Patient c/o pain with neck movements especially toward R side during session.  Therapeutic Activity: Bed Mobility: Patient performed supine to/from sit with supervision. Provided verbal cues for safety. Transfers: Patient performed sit to/from stand with supervision to RW from elevated bed height. Pt is able to relate most safety awareness measures prior to movements. Toilet transfer performed with supervision/ CGA for controlled descent. Min A for completely doffing/ redonning pants. Pericare performed with supervision/ Mod I.   Therapeutic Exercise: Patient guided in simple AROM exercises with thermotherapy pack applied to neck and shoulders. Active cervical rotations to R, lateral flexion to R, and scap sets performed 3x10 for each with several seconds hold into range no higher than 4/10. By end of repetitions, pt's range has visibly improved. Pt encouraged to continue throughout evening upon receiving thermotherapy that will arrive in room. Educated to remove heat after 20 min to allow tissues to cool and heal into new range of motion. Continuous heat is not preferred. Perform exercises into pain free ranges.   Patient supine in bed at end of session with brakes locked, bed alarm set, and all needs  within reach.     Therapy Documentation Precautions:  Precautions Precautions: Fall Precaution Comments: Neurogenic bladder - incontinent (premorbid) Restrictions Weight Bearing Restrictions: No   Therapy/Group: Individual Therapy  Alger Simons PT, DPT 01/15/2021, 3:54 PM

## 2021-01-17 LAB — PROTIME-INR
INR: 3.2 — ABNORMAL HIGH (ref 0.8–1.2)
Prothrombin Time: 31.9 seconds — ABNORMAL HIGH (ref 11.4–15.2)

## 2021-01-17 MED ORDER — WARFARIN SODIUM 5 MG PO TABS
5.0000 mg | ORAL_TABLET | ORAL | Status: DC
  Administered 2021-01-17 – 2021-01-21 (×4): 5 mg via ORAL
  Filled 2021-01-17 (×4): qty 1

## 2021-01-17 MED ORDER — WARFARIN SODIUM 7.5 MG PO TABS
7.5000 mg | ORAL_TABLET | ORAL | Status: DC
  Administered 2021-01-20: 7.5 mg via ORAL
  Filled 2021-01-17: qty 1

## 2021-01-17 NOTE — Progress Notes (Signed)
ANTICOAGULATION CONSULT NOTE  Pharmacy Consult for Warfarin Indication: mechanical MVR  Allergies  Allergen Reactions  . Erythromycin Nausea And Vomiting  . Valsartan Other (See Comments)    Pt reports caused her depression    Patient Measurements: Height: 5' 6.5" (168.9 cm) Weight: 64 kg (141 lb 1.5 oz) IBW/kg (Calculated) : 60.45  Vital Signs: Temp: 97.8 F (36.6 C) (04/01 0900) Temp Source: Oral (04/01 0900) BP: 144/82 (04/01 0900) Pulse Rate: 98 (04/01 0900)  Labs: Recent Labs    01/15/21 0525 01/16/21 0455 01/17/21 0538  LABPROT 31.0* 31.1* 31.9*  INR 3.1* 3.1* 3.2*  CREATININE 0.79  --   --     Estimated Creatinine Clearance: 61.6 mL/min (by C-G formula based on SCr of 0.79 mg/dL).   Assessment: Patient is a 70 yo F that presented to the ED with concerns for a stroke.  Transferred to inpatient rehab 3/28.  The patient is on warfarin PTA for a mechanical MVR with an INR goal of 2.5-3.5.   PTA Regimen: 7.5mg  M /Thur and 5mg  all other days  INR is therapeutic at 3.2 today. CBC stable, no bleeding noted.  Goal of Therapy:  INR 2.5-3.5 Monitor platelets by anticoagulation protocol: Yes   Plan:  - Warfarin 5 mg PO daily except 7.5 mg PO Mon/Thur per home regimen - Change INR to MWF - Monitor patient for s/sx of bleeding   Thank you for involving pharmacy in this patient's care.  Renold Genta, PharmD, BCPS Clinical Pharmacist Clinical phone for 01/17/2021 until 3p is [phone] 01/17/2021 3:31 PM  **Pharmacist phone directory can be found on [email or web address] listed under [facility]**

## 2021-01-17 NOTE — Progress Notes (Addendum)
Physical Therapy Session Note  Patient Details  Name: [name] [name] MRN: [id number] Date of Birth: 1951/09/18  Today's Date: 01/17/2021 PT Individual Time: 1302-1359 PT Individual Time Calculation (min): 57 min   Short Term Goals: Week 1:  PT Short Term Goal 1 (Week 1): Pt will complete bed mobility without bed features with supervision PT Short Term Goal 2 (Week 1): Pt will complete bed<>chair transfers with CGA and LRAD PT Short Term Goal 3 (Week 1): Pt will ambulate 35ft with CGA and LRAD PT Short Term Goal 4 (Week 1): Pt will initiate stair training  Skilled Therapeutic Interventions/Progress Updates:    Pt received sitting EOB with RN. RN left room and PT assumed pt care. Pt reported some back discomfort and asked for "stretching," but was agreeable to therapy. Shoes donned max assist. Transferred to [place] with RW CGA. Wheeled to therapy gym. Standing ball roll up on wall held for 1 min to promote thoracic extension. Performed 2x. Verbal cuing to "look up." Pt verbalized feeling back relief after this exercise. UE strengthening via wall push ups 3x10. Verbal cuing to "push shoulder blades apart" during concentric phase and "pull shoulder blades together" during eccentric. Pt verbalized that she had missed doing this exercise as it was one that she enjoyed doing in the past. Dynamic gait training with RW CGA via forward and side stepping out of box with 3" borders all around to train threshold clearing strategies. Gait training ~139ft with RW via pt cuing to "step over [PT's] foot;" therapist's foot continually replaced as pt ambulated forward. Pt verbalized neck pain returning. Manual therapy via R upper trigger point release + stretching. Education on breathing technique to self-relieve neck discomfort. Heat pack applied for ~27min. Pt wheeled back to room and left sitting in WC. Needs within reach and seat alarm turned on.   Therapy Documentation Precautions:  Precautions Precautions:  Fall Precaution Comments: Neurogenic bladder - incontinent (premorbid) Restrictions Weight Bearing Restrictions: No    Therapy/Group: Individual Therapy  Eleonore Chiquito, SPT 01/17/2021, 2:37 PM

## 2021-01-17 NOTE — Progress Notes (Signed)
Occupational Therapy Session Note  Patient Details  Name: [name] [name] MRN: [id number] Date of Birth: Sep 09, 1951  Today's Date: 01/17/2021 OT Individual Time: 1435-1536 OT Individual Time Calculation (min): 61 min   Short Term Goals: Week 1:  OT Short Term Goal 1 (Week 1): Pt will ambulate to bathroom with min A to S with RW. OT Short Term Goal 2 (Week 1): Pt will don pants over feet with S. OT Short Term Goal 3 (Week 1): Pt will be able to pull pants over hips with S in standing. OT Short Term Goal 4 (Week 1): Pt will demonstrate improved endurance to tolerate standing for 3 minutes.  Skilled Therapeutic Interventions/Progress Updates:    Pt greeted in the w/c, pain reportedly manageable for tx. She wanted to shower on Monday when family will bring in her personal soap. OT offered to supply pt with Dove soap however pt reports she wants to wait until family can bring a special brand from home. Other ADL needs were met. Therefore pt was escorted via w/c down to the atrium. Worked on dynamic standing balance in a community environment, pt using RW at ambulatory level with close supervision assistance. Vcs for forward gaze, Lt LE clearance, staying inside of the walker, and increasing stride length bilaterally. Pt reports these are cues that she frequently receives during her other therapy sessions. Lateral head turns when scanning items in the gift shop for gentle cervical mobility. Vcs for cervical retraction as well. She transferred in/out of a booth in the food court with vcs using device, able to laterally scoot to make room for another person if needed. Throughout session pt required vcs for redirection to task due to hyperverbal tendencies. She was then escorted via w/c back to the room. Ambulatory transfer to bed completed using RW with supervision. She remained in bed at close of session, all needs within reach and bed alarm set.    Therapy Documentation Precautions:   Precautions Precautions: Fall Precaution Comments: Neurogenic bladder - incontinent (premorbid) Restrictions Weight Bearing Restrictions: No ADL: ADL Eating: Set up Grooming: Setup Upper Body Bathing: Setup Lower Body Bathing: Minimal assistance Upper Body Dressing: Setup Lower Body Dressing: Maximal assistance Toileting: Maximal assistance Where Assessed-Toileting: Bedside Commode Toilet Transfer: Minimal assistance Toilet Transfer Method: Stand pivot      Therapy/Group: Individual Therapy  Ayushi Pla A Daphna Lafuente 01/17/2021, 3:46 PM

## 2021-01-17 NOTE — Progress Notes (Addendum)
[facility] PHYSICAL MEDICINE & REHABILITATION PROGRESS NOTE  Subjective/Complaints:  Patient seen ambulating from restroom to bed this morning with rolling walker, reminded patient regarding safety and call bell.  She notes her bowel movements are baseline.  She has questions regarding etiology of stroke.  She states he is looking forward to watching [facility] game tomorrow.  ROS: Denies CP, SOB, N/V/D  Objective: Vital Signs: Blood pressure 132/70, pulse 78, temperature 97.9 F (36.6 C), resp. rate 16, height 5' 6.5" (1.689 m), weight 64 kg, SpO2 97 %. No results found. No results for input(s): WBC, HGB, HCT, PLT in the last 72 hours. Recent Labs    01/15/21 0525  NA 139  K 3.8  CL 103  CO2 30  GLUCOSE 105*  BUN 20  CREATININE 0.79  CALCIUM 9.3    Intake/Output Summary (Last 24 hours) at 01/17/2021 0854 Last data filed at 01/16/2021 1355 Gross per 24 hour  Intake 296 ml  Output --  Net 296 ml        Physical Exam: BP 132/70 (BP Location: Left Arm)   Pulse 78   Temp 97.9 F (36.6 C)   Resp 16   Ht 5' 6.5" (1.689 m)   Wt 64 kg   SpO2 97%   BMI 22.43 kg/m  Constitutional: No distress . Vital signs reviewed. HENT: Normocephalic.  Atraumatic. Eyes: EOMI. No discharge. Cardiovascular: No JVD.  RRR. Respiratory: Normal effort.  No stridor.  Bilateral clear to auscultation. GI: Non-distended.  BS +. Skin: Warm and dry.  Intact. Psych: Normal mood.  Normal behavior. Musc: No edema in extremities.  No tenderness in extremities. Cervical kyphosis Neurologic: Alert Motor: 4/5 throughout, right slightly weaker than left + Torticollis, unchanged  Assessment/Plan: 1. Functional deficits which require 3+ hours per day of interdisciplinary therapy in a comprehensive inpatient rehab setting.  Physiatrist is providing close team supervision and 24 hour management of active medical problems listed below.  Physiatrist and rehab team continue to assess barriers to  discharge/monitor patient progress toward functional and medical goals   Care Tool:  Bathing    Body parts bathed by patient: Right arm,Left arm,Chest,Abdomen,Front perineal area,Buttocks,Right upper leg,Left upper leg,Face,Right lower leg,Left lower leg         Bathing assist Assist Level: Minimal Assistance - Patient > 75%     Upper Body Dressing/Undressing Upper body dressing   What is the patient wearing?: Pull over shirt    Upper body assist Assist Level: Set up assist    Lower Body Dressing/Undressing Lower body dressing      What is the patient wearing?: Underwear/pull up,Pants     Lower body assist Assist for lower body dressing: Maximal Assistance - Patient 25 - 49%     Toileting Toileting    Toileting assist Assist for toileting: Maximal Assistance - Patient 25 - 49%     Transfers Chair/bed transfer  Transfers assist  Chair/bed transfer activity did not occur: Safety/medical concerns  Chair/bed transfer assist level: Contact Guard/Touching assist     Locomotion Ambulation   Ambulation assist      Assist level: Contact Guard/Touching assist Assistive device: Walker-rolling Max distance: 175ft   Walk 10 feet activity   Assist     Assist level: Contact Guard/Touching assist Assistive device: Walker-rolling   Walk 50 feet activity   Assist Walk 50 feet with 2 turns activity did not occur: Safety/medical concerns  Assist level: Contact Guard/Touching assist Assistive device: Walker-rolling    Walk 150 feet activity  Assist Walk 150 feet activity did not occur: Safety/medical concerns  Assist level: Contact Guard/Touching assist Assistive device: Walker-rolling    Walk 10 feet on uneven surface  activity   Assist Walk 10 feet on uneven surfaces activity did not occur: Safety/medical concerns         Wheelchair     Assist Will patient use wheelchair at discharge?: No   Wheelchair activity did not occur: N/A          Wheelchair 50 feet with 2 turns activity    Assist    Wheelchair 50 feet with 2 turns activity did not occur: N/A       Wheelchair 150 feet activity     Assist  Wheelchair 150 feet activity did not occur: N/A        Medical Problem List and Plan: 1.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 Antithrombotics: -DVT/anticoagulation:Coumadin (2.5-3.5)  INR therapeutic on 4/1 -antiplatelet therapy: Aspirin 81 mg daily 3. Pain Management:Tylenol as needed 4. Mood:Provide emotional support -antipsychotic agents: N/A 5. Neuropsych: This patientiscapable of making decisions on hisown behalf. 6. Skin/Wound Care:Routine skin checks 7. Fluids/Electrolytes/Nutrition:Routine in and outs  BMP within acceptable range on 3/30 8. UTI.   Urinalysis positive nitrite, Urine culture showing multiple species  Rocephin transition to Augmentin through 4/1 9. Hypertension. Lopressor 25 mg twice daily.   Controlled on 4/1  Monitor with increased mobility 10. Hypothyroidism. TSH 0.371. Continue thyroid 90 mg daily 11. Hyperlipidemia. Lipitor 12. History of severe mitral regurgitation status post metal mitral valve replacement 2013. Continue chronic Coumadin. 13. Diastolic congestive heart failure. Monitor for any signs of fluid overload Filed Weights   01/13/21 1700  Weight: 64 kg   14. COPD. Check oxygen saturations every shift  No breathing issues on 4/1  To monitor with increased exertion 15. 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 Patient has never been on modifying therapy due to concerns for potential side effects but has not had any reported documented MS flare since diagnosis.  Patient states she urinates every 5 minutes and wears depends at home, which were not available to her, daughter-in-law to bring in  Pure wick nightly 16.  Paroxysmal Atrial  Fibrillation- lopressor, cont Warfarin  Patient states she has had a maze, ablation in the past and notes since that time she has only been in atrial fibrillation 1 time and that was in 2015.  Controlled on 4/1 17.  Torticollis with cervical kyphosis  Patient wary of injections, and notes she has grown accustomed to it, is willing to work with therapies to improve range of motion   Discussed with therapies ROM   LOS: 4 days A FACE TO FACE EVALUATION WAS PERFORMED  [name] [name] [name] [name] 01/17/2021, 8:54 AM

## 2021-01-17 NOTE — Progress Notes (Signed)
Physical Therapy Session Note  Patient Details  Name: [name] [name] MRN: [id number] Date of Birth: [birth year]  Today's Date: 01/17/2021 PT Individual Time: 0900-1000 PT Individual Time Calculation (min): 60 min   Short Term Goals: Week 1:  PT Short Term Goal 1 (Week 1): Pt will complete bed mobility without bed features with supervision PT Short Term Goal 2 (Week 1): Pt will complete bed<>chair transfers with CGA and LRAD PT Short Term Goal 3 (Week 1): Pt will ambulate 74ft with CGA and LRAD PT Short Term Goal 4 (Week 1): Pt will initiate stair training  Skilled Therapeutic Interventions/Progress Updates:   Pt greeted sitting EOB, getting dressed with RN in the room for morning medications. Pt denies pain and agreeable to therapy session. Stand<>pivot transfer with CGA and RW from EOB to w/c and w/c transport to main rehab gym. Performed seated there-ex with 2lb dowel rod - shldr press + thoracic rotation combination, 2x10. Applied cervical moist heat pack with pillow case for skin protection - with heat pack in place, provided PROM for cervical rotation/flexion/extension with emphasis on R lateral flexion (ear to shldr) to improve her torticollis posturing. Pt reporting subjective improvement in ROM as well as improvement in chronic neck pain. With removal of moist heat pack, skin c/d/i with mild redness that resolves quickly. Performed NMR dynamic standing balance - unweighted ball toss with +2 assist for guarding patient - working on righting reactions, reaching outside cone of stability, and ankle/hip strategies. Continued NMR balance activities on blue airex foam pad playing life-size checker board, completed with efforts to play with no BUE support but occasional unilateral support needed, CGA provided for steadying. Returned to her room with totalA in w/c - stand<>pivot with CGA and RW back to bed and totalA for removing shoes for time management. Sit>supine with supervision with use of bed  features. Remained supine in bed with needs in reach, bed alarm on, pt made comfortable.   Therapy Documentation Precautions:  Precautions Precautions: Fall Precaution Comments: Neurogenic bladder - incontinent (premorbid) Restrictions Weight Bearing Restrictions: No General:    Therapy/Group: Individual Therapy  Paighton Godette P Afsana Liera PT 01/17/2021, 7:25 AM

## 2021-01-18 NOTE — Progress Notes (Signed)
ANTICOAGULATION CONSULT NOTE  Pharmacy Consult for Warfarin Indication: mechanical MVR  Allergies  Allergen Reactions  . Erythromycin Nausea And Vomiting  . Valsartan Other (See Comments)    Pt reports caused her depression    Patient Measurements: Height: 5' 6.5" (168.9 cm) Weight: 64 kg (141 lb 1.5 oz) IBW/kg (Calculated) : 60.45  Vital Signs: Temp: 98 F (36.7 C) (04/02 0536) BP: 137/61 (04/02 0536) Pulse Rate: 83 (04/02 0536)  Labs: Recent Labs    01/16/21 0455 01/17/21 0538  LABPROT 31.1* 31.9*  INR 3.1* 3.2*    Estimated Creatinine Clearance: 61.6 mL/min (by C-G formula based on SCr of 0.79 mg/dL).   Assessment: Patient is a 70 yo F that presented to the ED with concerns for a stroke.  Transferred to inpatient rehab 3/28.  The patient is on warfarin PTA for a mechanical MVR with an INR goal of 2.5-3.5.   PTA Regimen: 7.5mg  M /Thur and 5mg  all other days  Last INR 4/1 therapeutic at 3.2. Last CBC 3/28 stable, no bleeding noted.  Goal of Therapy:  INR 2.5-3.5 Monitor platelets by anticoagulation protocol: Yes   Plan:  - Warfarin 5 mg PO daily except 7.5 mg PO Mon/Thur per home regimen - Check INR MWF - Monitor patient for s/sx of bleeding   Fara Olden, PharmD PGY-1 Pharmacy Resident 01/18/2021 9:44 AM Please see AMION for all pharmacy numbers

## 2021-01-18 NOTE — Progress Notes (Signed)
[place] PHYSICAL MEDICINE & REHABILITATION PROGRESS NOTE  Subjective/Complaints:   Patient reports no issues.  She reports she has a bowel movement pretty much every day-however sometimes it takes a little bit longer to go.  No other complaints.  ROS: Pt denies SOB, abd pain, CP, N/V/C/D, and vision changes   Objective: Vital Signs: Blood pressure (!) 124/57, pulse 71, temperature 98.3 F (36.8 C), resp. rate 17, height 5' 6.5" (1.689 m), weight 64 kg, SpO2 98 %. No results found. No results for input(s): WBC, HGB, HCT, PLT in the last 72 hours. No results for input(s): NA, K, CL, CO2, GLUCOSE, BUN, CREATININE, CALCIUM in the last 72 hours.  Intake/Output Summary (Last 24 hours) at 01/18/2021 1611 Last data filed at 01/18/2021 0845 Gross per 24 hour  Intake 342 ml  Output --  Net 342 ml        Physical Exam: BP (!) 124/57 (BP Location: Left Arm)   Pulse 71   Temp 98.3 F (36.8 C)   Resp 17   Ht 5' 6.5" (1.689 m)   Wt 64 kg   SpO2 98%   BMI 22.43 kg/m   General: awake, alert, appropriate, - significant torticollis with head tilted to L and chin to Right; NAD HENT: conjugate gaze; oropharynx moist CV: regular rate; no JVD Pulmonary: CTA B/L; no W/R/R- good air movement GI: soft, NT, ND, (+)BS Psychiatric: appropriate Neurological: Ox3 Skin_ warm, dry Musc: No edema in extremities.  No tenderness in extremities. Cervical kyphosis Neurologic: Alert Motor: 4/5 throughout, right slightly weaker than left + Torticollis, unchanged  Assessment/Plan: 1. Functional deficits which require 3+ hours per day of interdisciplinary therapy in a comprehensive inpatient rehab setting.  Physiatrist is providing close team supervision and 24 hour management of active medical problems listed below.  Physiatrist and rehab team continue to assess barriers to discharge/monitor patient progress toward functional and medical goals   Care Tool:  Bathing    Body parts bathed by  patient: Right arm,Left arm,Chest,Abdomen,Front perineal area,Buttocks,Right upper leg,Left upper leg,Face,Right lower leg,Left lower leg         Bathing assist Assist Level: Minimal Assistance - Patient > 75%     Upper Body Dressing/Undressing Upper body dressing   What is the patient wearing?: Pull over shirt    Upper body assist Assist Level: Set up assist    Lower Body Dressing/Undressing Lower body dressing      What is the patient wearing?: Underwear/pull up,Pants     Lower body assist Assist for lower body dressing: Maximal Assistance - Patient 25 - 49%     Toileting Toileting    Toileting assist Assist for toileting: Maximal Assistance - Patient 25 - 49%     Transfers Chair/bed transfer  Transfers assist  Chair/bed transfer activity did not occur: Safety/medical concerns  Chair/bed transfer assist level: Contact Guard/Touching assist     Locomotion Ambulation   Ambulation assist      Assist level: Contact Guard/Touching assist Assistive device: Walker-rolling Max distance: 17ft   Walk 10 feet activity   Assist     Assist level: Contact Guard/Touching assist Assistive device: Walker-rolling   Walk 50 feet activity   Assist Walk 50 feet with 2 turns activity did not occur: Safety/medical concerns  Assist level: Contact Guard/Touching assist Assistive device: Walker-rolling    Walk 150 feet activity   Assist Walk 150 feet activity did not occur: Safety/medical concerns  Assist level: Contact Guard/Touching assist Assistive device: Walker-rolling  Walk 10 feet on uneven surface  activity   Assist Walk 10 feet on uneven surfaces activity did not occur: Safety/medical concerns         Wheelchair     Assist Will patient use wheelchair at discharge?: No   Wheelchair activity did not occur: N/A         Wheelchair 50 feet with 2 turns activity    Assist    Wheelchair 50 feet with 2 turns activity did not  occur: N/A       Wheelchair 150 feet activity     Assist  Wheelchair 150 feet activity did not occur: N/A        Medical Problem List and Plan: 1.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 Antithrombotics: -DVT/anticoagulation:Coumadin (2.5-3.5)  INR therapeutic on 4/1 -antiplatelet therapy: Aspirin 81 mg daily 3. Pain Management:Tylenol as needed 4. Mood:Provide emotional support -antipsychotic agents: N/A 5. Neuropsych: This patientiscapable of making decisions on hisown behalf. 6. Skin/Wound Care:Routine skin checks 7. Fluids/Electrolytes/Nutrition:Routine in and outs  BMP within acceptable range on 3/30 8. UTI.   Urinalysis positive nitrite, Urine culture showing multiple species  Rocephin transition to Augmentin through 4/1  4/2-patient off antibiotics as of yesterday; continue to monitor for symptoms. 9. Hypertension. Lopressor 25 mg twice daily.   4/2-blood pressure controlled; continue regimen  Monitor with increased mobility 10. Hypothyroidism. TSH 0.371. Continue thyroid 90 mg daily 11. Hyperlipidemia. Lipitor 12. History of severe mitral regurgitation status post metal mitral valve replacement 2013. Continue chronic Coumadin. 13. Diastolic congestive heart failure. Monitor for any signs of fluid overload Filed Weights   01/13/21 1700  Weight: 64 kg   4/2-we will order daily weights 14. COPD. Check oxygen saturations every shift  No breathing issues on 4/1  To monitor with increased exertion 15. 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 Patient has never been on modifying therapy due to concerns for potential side effects but has not had any reported documented MS flare since diagnosis.  Patient states she urinates every 5 minutes and wears depends at home, which were not available to her, daughter-in-law to bring in  Pure wick  nightly 16.  Paroxysmal Atrial Fibrillation- lopressor, cont Warfarin  Patient states she has had a maze, ablation in the past and notes since that time she has only been in atrial fibrillation 1 time and that was in 2015.  Controlled on 4/1 17.  Torticollis with cervical kyphosis  Patient wary of injections, and notes she has grown accustomed to it, is willing to work with therapies to improve range of motion   Discussed with therapies ROM   LOS: 5 days A FACE TO FACE EVALUATION WAS PERFORMED  Vlad Mayberry 01/18/2021, 4:11 PM

## 2021-01-18 NOTE — Progress Notes (Signed)
Physical Therapy Session Note  Patient Details  Name: [name] [name] MRN: [id number] Date of Birth: Mar 14, 1951  Today's Date: 01/18/2021 PT Individual Time: 0900-0954 PT Individual Time Calculation (min): 54 min   Short Term Goals: Week 1:  PT Short Term Goal 1 (Week 1): Pt will complete bed mobility without bed features with supervision PT Short Term Goal 2 (Week 1): Pt will complete bed<>chair transfers with CGA and LRAD PT Short Term Goal 3 (Week 1): Pt will ambulate 78ft with CGA and LRAD PT Short Term Goal 4 (Week 1): Pt will initiate stair training  Skilled Therapeutic Interventions/Progress Updates:    Pt received supine in bed, agreeable to therapy - no reports of pain. Requesting assistance to get dressed prior to going to rehab gym. Supine<>sit with supervision with bed features. Forward scooting at EOB with supervision. Completed dressing at EOB level with mostly setupA, including bra, shirt, and pants. Donned socks/shoes with totalA for time management. Sit<>stand with CGA from raised EOB height to RW. Stand<>pivot transfer with CGA and RW to w/c - cues for L foot awareness (tends to leave L foot out of walker frame) and safety approach to sitting surface. W/c transport to main rehab for time management. In // bars, completed the following standing there-ex with CGA: -1x10 hip abduction, bilaterally -1x10 knee marches, bilaterally -1x10 modified push-ups to // bar -1x10 mini-squats -1x10 lateral step-overs over cone  Gait 2x129ft with CGA and RW - cues for postural awareness, RW management, and maintaining proximity to RW. Demo's decreased gait speed with poor L>R step length.   Stair training x8 steps with CGA and 2 hand rails, step-to pattern for both ascent and descent - cues needed for monitoring foot placement to ensure full foot on step during sequencing.   Returned to her room with [place] in w/c for time management. Stand<>Pivot with CGA and RW to EOB with similar cues as  written above. Removed shoes/socks with totalA for time management. Sit>supine with supervision with bed features. She remained supine in bed with needs in reach, bed alarm on, made comfortable.    Therapy Documentation Precautions:  Precautions Precautions: Fall Precaution Comments: Neurogenic bladder - incontinent (premorbid) Restrictions Weight Bearing Restrictions: No General:    Therapy/Group: Individual Therapy  Elyce Zollinger P Lynea Rollison PT 01/18/2021, 9:54 AM

## 2021-01-19 DIAGNOSIS — K5901 Slow transit constipation: Secondary | ICD-10-CM

## 2021-01-19 MED ORDER — BISACODYL 10 MG RE SUPP
10.0000 mg | Freq: Once | RECTAL | Status: DC
  Filled 2021-01-19 (×2): qty 1

## 2021-01-19 NOTE — Progress Notes (Signed)
Occupational Therapy Session Note  Patient Details  Name: AYELET GRUENEWALD MRN: [id number] Date of Birth: [birth year]  Today's Date: 01/19/2021 OT Individual Time: 1300-1327 OT Individual Time Calculation (min): 27 min   Short Term Goals: Week 1:  OT Short Term Goal 1 (Week 1): Pt will ambulate to bathroom with min A to S with RW. OT Short Term Goal 2 (Week 1): Pt will don pants over feet with S. OT Short Term Goal 3 (Week 1): Pt will be able to pull pants over hips with S in standing. OT Short Term Goal 4 (Week 1): Pt will demonstrate improved endurance to tolerate standing for 3 minutes.  Skilled Therapeutic Interventions/Progress Updates:    Pt greeted while sitting on the toilet. She had a BM void, completed toileting tasks with supervision for balance before ambulating to the sink to wash her hands using the RW with supervision assist. Pt also completed oral care while standing. She returned to EOB and then OT guided her through gentle yoga-based stretches to improve ROM/alignment of cervical spine. Pt returned to the bed at end of session, left her with all needs within reach and bed alarm set.   Therapy Documentation Precautions:  Precautions Precautions: Fall Precaution Comments: Neurogenic bladder - incontinent (premorbid) Restrictions Weight Bearing Restrictions: No Vital Signs: Therapy Vitals Temp: 97.8 F (36.6 C) Temp Source: Oral Pulse Rate: 79 Resp: 18 BP: (!) 104/55 Patient Position (if appropriate): Lying Oxygen Therapy SpO2: 97 % O2 Device: Room Air Pain: no s/s pain during tx   ADL: ADL Eating: Set up Grooming: Setup Upper Body Bathing: Setup Lower Body Bathing: Minimal assistance Upper Body Dressing: Setup Lower Body Dressing: Maximal assistance Toileting: Maximal assistance Where Assessed-Toileting: Bedside Commode Toilet Transfer: Minimal assistance Toilet Transfer Method: Stand pivot     Therapy/Group: Individual Therapy  Jlyn Cerros A  Jeyli Zwicker 01/19/2021, 3:43 PM

## 2021-01-19 NOTE — Progress Notes (Addendum)
Physical Therapy Session Note  Patient Details  Name: [name] [name] MRN: [id number] Date of Birth: [birth year]  Today's Date: 01/19/2021 PT Individual Time: 0906-1008 PT Individual Time Calculation (min): 62 min   Short Term Goals: Week 1:  PT Short Term Goal 1 (Week 1): Pt will complete bed mobility without bed features with supervision PT Short Term Goal 2 (Week 1): Pt will complete bed<>chair transfers with CGA and LRAD PT Short Term Goal 3 (Week 1): Pt will ambulate 72ft with CGA and LRAD PT Short Term Goal 4 (Week 1): Pt will initiate stair training  Skilled Therapeutic Interventions/Progress Updates:  Pt resting in bed; she denied pain.    neuromuscular re-education via demo, multimodal cues: In supine- bil bridging with focus on eccentric control, bil hip abduction/adduction in hook lying, L hip circles clockwise and counterclockwise with flexed hip and knee, bil hip adductor squeezes in hook lying, bil hip adductor squeezes with pelvic tilt, alternating ankle pumps.  Pt found  counting aloud to be challenging, while performing alternating ankle pumps.  Sitting EOB, scooting without use of UEs with mod assist to elevate hips and move laterally.  Max cues for anterior wt shift and loading bil LEs.  Pt reported that she needed to use toilet.  Supine> sit to L in flat bed, no rails, TCS and VCs for technique. Pt donned shoes with set up.  Sit> stand from slightly raised bed, CGA to RW.   Gait training with CGA and RW  to/from toilet in [place] for continent voiding. 1 cue needed for longer L step length. Pt pulled pants and pull up down.  Pull up was wet. Pt performed peri care independently.  PT managed pants and shoes doffing/donning when donning dry pull up, for time mgt.  Pt reported that her L hip felt much looser from bed level exs before ambulation.    At end of session, pt in bed in long sitting position, 4 rails up per pt request.  Bed alarm set and needs left at hand.          Therapy Documentation Precautions:  Precautions Precautions: Fall Precaution Comments: Neurogenic bladder - incontinent (premorbid) Restrictions Weight Bearing Restrictions: No      Therapy/Group: Individual Therapy  Annakate Soulier 01/19/2021, 10:14 AM

## 2021-01-19 NOTE — Progress Notes (Signed)
ANTICOAGULATION CONSULT NOTE  Pharmacy Consult for Warfarin Indication: mechanical MVR  Allergies  Allergen Reactions  . Erythromycin Nausea And Vomiting  . Valsartan Other (See Comments)    Pt reports caused her depression    Patient Measurements: Height: 5' 6.5" (168.9 cm) Weight: 66.4 kg (146 lb 6.2 oz) IBW/kg (Calculated) : 60.45  Vital Signs: Temp: 98 F (36.7 C) (04/03 0429) Temp Source: Oral (04/03 0429) BP: 129/73 (04/03 0746) Pulse Rate: 89 (04/03 0746)  Labs: Recent Labs    01/17/21 0538  LABPROT 31.9*  INR 3.2*    Estimated Creatinine Clearance: 61.6 mL/min (by C-G formula based on SCr of 0.79 mg/dL).   Assessment: Patient is a 70 yo F that presented to the ED with concerns for a stroke.  Transferred to inpatient rehab 3/28.  The patient is on warfarin PTA for a mechanical MVR with an INR goal of 2.5-3.5.   PTA Regimen: 7.5mg  M /Thur and 5mg  all other days  Last INR 4/1 therapeutic at 3.2. Last CBC 3/28 stable, no bleeding noted.  Goal of Therapy:  INR 2.5-3.5 Monitor platelets by anticoagulation protocol: Yes   Plan:  - Continue Warfarin 5 mg PO daily except 7.5 mg PO Mon/Thur per home regimen - Check INR MWF - Monitor patient for s/sx of bleeding   Fara Olden, PharmD PGY-1 Pharmacy Resident 01/19/2021 8:49 AM Please see AMION for all pharmacy numbers

## 2021-01-19 NOTE — Progress Notes (Addendum)
Physical Therapy Session Note  Patient Details  Name: [name] [name] MRN: [id number] Date of Birth: Jan 06, 1951  Today's Date: 01/19/2021 PT Individual Time: 4975-3005 PT Individual Time Calculation (min): 48 min   Short Term Goals: Week 1:  PT Short Term Goal 1 (Week 1): Pt will complete bed mobility without bed features with supervision PT Short Term Goal 2 (Week 1): Pt will complete bed<>chair transfers with CGA and LRAD PT Short Term Goal 3 (Week 1): Pt will ambulate 50ft with CGA and LRAD PT Short Term Goal 4 (Week 1): Pt will initiate stair training  Skilled Therapeutic Interventions/Progress Updates:   Chart reviewed. Pt received semi-reclined in bed and agreeable to therapy.  Pt began session with supervision to transfer to EOB and set up assistance to donn socks and shoes. Pt then transferred to [facility] with supervision + RW. Pt taken to therapy gym for multiple ambulation trails where she ambulate up to 34ft with supervision + RW in a single trial. Pt required minimum VCs for RW navigation. Pt then stood on foam mat at table for dynamic standing activities in including reaches and checkers game. Pt required close supervision for dynamic standing balance on foam with no UE support. Pt returned to room and was left seated in [facility] with call bell, table, and phone all in reach and chair alarm engaged.   Therapy Documentation Precautions:  Precautions Precautions: Fall Precaution Comments: Neurogenic bladder - incontinent (premorbid) Restrictions Weight Bearing Restrictions: No  Pain: Pain Assessment Pain Scale: 0-10 Pain Score: 0-No pain    Therapy/Group: Individual Therapy  Marquette Saa, PT, DPT 01/19/2021, 3:55 PM

## 2021-01-19 NOTE — Progress Notes (Signed)
[place] PHYSICAL MEDICINE & REHABILITATION PROGRESS NOTE  Subjective/Complaints:   Patient reports that she is to get a shower today. She had no bowel movement yesterday-she says it is "down low" but cannot get it "out". She asked for something to help with that. She is voiding well and she slept well.  ROS:  Pt denies SOB, abd pain, CP, N/V/D, and vision changes   Objective: Vital Signs: Blood pressure (!) 104/55, pulse 79, temperature 97.8 F (36.6 C), temperature source Oral, resp. rate 18, height 5' 6.5" (1.689 m), weight 66.4 kg, SpO2 97 %. No results found. No results for input(s): WBC, HGB, HCT, PLT in the last 72 hours. No results for input(s): NA, K, CL, CO2, GLUCOSE, BUN, CREATININE, CALCIUM in the last 72 hours.  Intake/Output Summary (Last 24 hours) at 01/19/2021 1441 Last data filed at 01/18/2021 1950 Gross per 24 hour  Intake 120 ml  Output --  Net 120 ml        Physical Exam: BP (!) 104/55 (BP Location: Left Arm)   Pulse 79   Temp 97.8 F (36.6 C) (Oral)   Resp 18   Ht 5' 6.5" (1.689 m)   Wt 66.4 kg   SpO2 97%   BMI 23.27 kg/m    General: awake, alert, appropriate, sitting edge of bed with PT and nurse in the room;  NAD HENT: conjugate gaze; oropharynx moist- torticollis noted CV: regular rate; no JVD Pulmonary: CTA B/L; no W/R/R- good air movement GI: soft, NT, ND, (+)BS-slightly hypoactive Psychiatric: appropriate Neurological: Ox3 Skin_ warm, dry Musc: No edema in extremities.  No tenderness in extremities. Cervical kyphosis Neurologic: Alert Motor: 4/5 throughout, right slightly weaker than left + Torticollis, unchanged  Assessment/Plan: 1. Functional deficits which require 3+ hours per day of interdisciplinary therapy in a comprehensive inpatient rehab setting.  Physiatrist is providing close team supervision and 24 hour management of active medical problems listed below.  Physiatrist and rehab team continue to assess barriers to  discharge/monitor patient progress toward functional and medical goals   Care Tool:  Bathing    Body parts bathed by patient: Right arm,Left arm,Chest,Abdomen,Front perineal area,Buttocks,Right upper leg,Left upper leg,Face,Right lower leg,Left lower leg         Bathing assist Assist Level: Minimal Assistance - Patient > 75%     Upper Body Dressing/Undressing Upper body dressing   What is the patient wearing?: Pull over shirt    Upper body assist Assist Level: Set up assist    Lower Body Dressing/Undressing Lower body dressing      What is the patient wearing?: Underwear/pull up,Pants     Lower body assist Assist for lower body dressing: Maximal Assistance - Patient 25 - 49%     Toileting Toileting    Toileting assist Assist for toileting: Supervision/Verbal cueing     Transfers Chair/bed transfer  Transfers assist  Chair/bed transfer activity did not occur: Safety/medical concerns  Chair/bed transfer assist level: Contact Guard/Touching assist     Locomotion Ambulation   Ambulation assist      Assist level: Contact Guard/Touching assist Assistive device: Walker-rolling Max distance: 20   Walk 10 feet activity   Assist     Assist level: Contact Guard/Touching assist Assistive device: Walker-rolling   Walk 50 feet activity   Assist Walk 50 feet with 2 turns activity did not occur: Safety/medical concerns  Assist level: Contact Guard/Touching assist Assistive device: Walker-rolling    Walk 150 feet activity   Assist Walk 150 feet activity  did not occur: Safety/medical concerns  Assist level: Contact Guard/Touching assist Assistive device: Walker-rolling    Walk 10 feet on uneven surface  activity   Assist Walk 10 feet on uneven surfaces activity did not occur: Safety/medical concerns         Wheelchair     Assist Will patient use wheelchair at discharge?: No   Wheelchair activity did not occur: N/A          Wheelchair 50 feet with 2 turns activity    Assist    Wheelchair 50 feet with 2 turns activity did not occur: N/A       Wheelchair 150 feet activity     Assist  Wheelchair 150 feet activity did not occur: N/A        Medical Problem List and Plan: 1.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 Antithrombotics: -DVT/anticoagulation:Coumadin (2.5-3.5)  INR therapeutic on 4/1 -antiplatelet therapy: Aspirin 81 mg daily 3. Pain Management:Tylenol as needed 4. Mood:Provide emotional support -antipsychotic agents: N/A 5. Neuropsych: This patientiscapable of making decisions on hisown behalf. 6. Skin/Wound Care:Routine skin checks 7. Fluids/Electrolytes/Nutrition:Routine in and outs  BMP within acceptable range on 3/30 8. UTI.   Urinalysis positive nitrite, Urine culture showing multiple species  Rocephin transition to Augmentin through 4/1  4/2-patient off antibiotics as of yesterday; continue to monitor for symptoms. 9. Hypertension. Lopressor 25 mg twice daily.   4/2-blood pressure controlled; continue regimen  4/3-blood pressure is a little soft, however having no orthostatic hypotension-continue regimen  Monitor with increased mobility 10. Hypothyroidism. TSH 0.371. Continue thyroid 90 mg daily 11. Hyperlipidemia. Lipitor 12. History of severe mitral regurgitation status post metal mitral valve replacement 2013. Continue chronic Coumadin. 13. Diastolic congestive heart failure. Monitor for any signs of fluid overload Filed Weights   01/13/21 1700 01/19/21 0500  Weight: 64 kg 66.4 kg   4/2-we will order daily weights  4/3-patient has gained 2 kg in the last 5 days-if it stays this much tomorrow I suggest an additional diuretic. 14. COPD. Check oxygen saturations every shift  No breathing issues on 4/1  To monitor with increased  exertion 15. 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 Patient has never been on modifying therapy due to concerns for potential side effects but has not had any reported documented MS flare since diagnosis.  Patient states she urinates every 5 minutes and wears depends at home, which were not available to her, daughter-in-law to bring in  Pure wick nightly 16.  Paroxysmal Atrial Fibrillation- lopressor, cont Warfarin  Patient states she has had a maze, ablation in the past and notes since that time she has only been in atrial fibrillation 1 time and that was in 2015.  Controlled on 4/1 17.  Torticollis with cervical kyphosis  Patient wary of injections, and notes she has grown accustomed to it, is willing to work with therapies to improve range of motion   Discussed with therapies ROM 18. Constipation  4/3-we will order suppository after 3:30 pm today-since she finishes therapy at 3:30pm.  Continue regimen   LOS: 6 days A FACE TO FACE EVALUATION WAS PERFORMED  Christo Hain 01/19/2021, 2:41 PM

## 2021-01-19 NOTE — Progress Notes (Signed)
Occupational Therapy Session Note  Patient Details  Name: [name] [name] MRN: [id number] Date of Birth: [birth year]  Today's Date: 01/19/2021 OT Individual Time: 0931-1216 OT Individual Time Calculation (min): 60 min    Short Term Goals: Week 1:  OT Short Term Goal 1 (Week 1): Pt will ambulate to bathroom with min A to S with RW. OT Short Term Goal 2 (Week 1): Pt will don pants over feet with S. OT Short Term Goal 3 (Week 1): Pt will be able to pull pants over hips with S in standing. OT Short Term Goal 4 (Week 1): Pt will demonstrate improved endurance to tolerate standing for 3 minutes.  Skilled Therapeutic Interventions/Progress Updates:    Pt at sink brushing teeth upon entry to room with NT in room. Pt very pleasant and motivated throughout session. Pt completing functional mobility around room with RW at supervision level. She was able to direct care with setting up shower. RN entered for med pass and vital check. Pt verbose and requiring redirection to task. She transferred into shower with supervision, requiring min cueing for technique of doffing clothing. She bathed seated on TTB with supervision overall. She transferred back to EOB with CGA, requiring min cueing for RW management and midline orientation. Pt donned shirt and bra with set up assist. Min A to don pants and to tie shoes. She returned to supine and was left with all needs met, bed alarm set.   Therapy Documentation Precautions:  Precautions Precautions: Fall Precaution Comments: Neurogenic bladder - incontinent (premorbid) Restrictions Weight Bearing Restrictions: No   Therapy/Group: Individual Therapy  Curtis Sites 01/19/2021, 6:45 AM

## 2021-01-20 DIAGNOSIS — K5901 Slow transit constipation: Secondary | ICD-10-CM

## 2021-01-20 LAB — PROTIME-INR
INR: 3 — ABNORMAL HIGH (ref 0.8–1.2)
Prothrombin Time: 30.2 seconds — ABNORMAL HIGH (ref 11.4–15.2)

## 2021-01-20 NOTE — Progress Notes (Signed)
[place] PHYSICAL MEDICINE & REHABILITATION PROGRESS NOTE  Subjective/Complaints:  Patient seen sitting up in bed this morning.  She states she slept well overnight.  She states she had a good weekend.  She states she had a good bowel movement yesterday afternoon.  She also states that she enjoyed watching the basketball game over the weekend.  ROS: Denies CP, SOB, N/V/D  Objective: Vital Signs: Blood pressure (!) 135/98, pulse 87, temperature 97.8 F (36.6 C), temperature source Oral, resp. rate 16, height 5' 6.5" (1.689 m), weight 66.2 kg, SpO2 99 %. No results found. No results for input(s): WBC, HGB, HCT, PLT in the last 72 hours. No results for input(s): NA, K, CL, CO2, GLUCOSE, BUN, CREATININE, CALCIUM in the last 72 hours.  Intake/Output Summary (Last 24 hours) at 01/20/2021 0859 Last data filed at 01/20/2021 0815 Gross per 24 hour  Intake 1014 ml  Output --  Net 1014 ml        Physical Exam: BP (!) 135/98 (BP Location: Left Arm)   Pulse 87   Temp 97.8 F (36.6 C) (Oral)   Resp 16   Ht 5' 6.5" (1.689 m)   Wt 66.2 kg   SpO2 99%   BMI 23.20 kg/m  Constitutional: No distress . Vital signs reviewed. HENT: Normocephalic.  Atraumatic. Eyes: EOMI. No discharge. Cardiovascular: No JVD.  RRR. Respiratory: Normal effort.  No stridor.  Bilateral clear to auscultation. GI: Non-distended.  BS +. Skin: Warm and dry.  Intact. Psych: Normal mood.  Normal behavior. Musc: No edema in extremities.  No tenderness in extremities. Cervical kyphosis, unchanged Neurologic: Alert Motor: 4/5 throughout, right slightly weaker than left + Torticollis, stable  Assessment/Plan: 1. Functional deficits which require 3+ hours per day of interdisciplinary therapy in a comprehensive inpatient rehab setting.  Physiatrist is providing close team supervision and 24 hour management of active medical problems listed below.  Physiatrist and rehab team continue to assess barriers to  discharge/monitor patient progress toward functional and medical goals   Care Tool:  Bathing    Body parts bathed by patient: Right arm,Left arm,Chest,Abdomen,Front perineal area,Buttocks,Right upper leg,Left upper leg,Face,Right lower leg,Left lower leg         Bathing assist Assist Level: Minimal Assistance - Patient > 75%     Upper Body Dressing/Undressing Upper body dressing   What is the patient wearing?: Pull over shirt    Upper body assist Assist Level: Set up assist    Lower Body Dressing/Undressing Lower body dressing      What is the patient wearing?: Underwear/pull up,Pants     Lower body assist Assist for lower body dressing: Maximal Assistance - Patient 25 - 49%     Toileting Toileting    Toileting assist Assist for toileting: Supervision/Verbal cueing     Transfers Chair/bed transfer  Transfers assist  Chair/bed transfer activity did not occur: Safety/medical concerns  Chair/bed transfer assist level: Supervision/Verbal cueing     Locomotion Ambulation   Ambulation assist      Assist level: Supervision/Verbal cueing Assistive device: Walker-rolling Max distance: 50   Walk 10 feet activity   Assist     Assist level: Supervision/Verbal cueing Assistive device: Walker-rolling   Walk 50 feet activity   Assist Walk 50 feet with 2 turns activity did not occur: Safety/medical concerns  Assist level: Contact Guard/Touching assist Assistive device: Walker-rolling    Walk 150 feet activity   Assist Walk 150 feet activity did not occur: Safety/medical concerns  Assist level: Contact  Guard/Touching assist Assistive device: Walker-rolling    Walk 10 feet on uneven surface  activity   Assist Walk 10 feet on uneven surfaces activity did not occur: Safety/medical concerns         Wheelchair     Assist Will patient use wheelchair at discharge?: No   Wheelchair activity did not occur: N/A         Wheelchair 50  feet with 2 turns activity    Assist    Wheelchair 50 feet with 2 turns activity did not occur: N/A       Wheelchair 150 feet activity     Assist  Wheelchair 150 feet activity did not occur: N/A        Medical Problem List and Plan: 1.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 Antithrombotics: -DVT/anticoagulation:Coumadin (2.5-3.5)  INR therapeutic on 4/4 -antiplatelet therapy: Aspirin 81 mg daily 3. Pain Management:Tylenol as needed 4. Mood:Provide emotional support -antipsychotic agents: N/A 5. Neuropsych: This patientiscapable of making decisions on hisown behalf. 6. Skin/Wound Care:Routine skin checks 7. Fluids/Electrolytes/Nutrition:Routine in and outs  BMP within acceptable range on 3/30 8. UTI.   Urinalysis positive nitrite, Urine culture showing multiple species  Completed course of antibiotics on 4/1 9. Hypertension. Lopressor 25 mg twice daily.   Relatively controlled on 4/4  Monitor with increased mobility 10. Hypothyroidism. TSH 0.371. Continue thyroid 90 mg daily 11. Hyperlipidemia. Lipitor 12. History of severe mitral regurgitation status post metal mitral valve replacement 2013. Continue chronic Coumadin. 13. Diastolic congestive heart failure. Monitor for any signs of fluid overload Filed Weights   01/13/21 1700 01/19/21 0500 01/20/21 0506  Weight: 64 kg 66.4 kg 66.2 kg   Stable on 4/4 14. COPD. Check oxygen saturations every shift  No breathing issues on 4/4  To monitor with increased exertion 15. 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 Patient has never been on modifying therapy due to concerns for potential side effects but has not had any reported documented MS flare since diagnosis.  Patient states she urinates every 5 minutes and wears depends at home, which were not available to her, daughter-in-law to bring  in  Pure wick nightly 16.  Paroxysmal Atrial Fibrillation- lopressor, cont Warfarin  Patient states she has had a maze, ablation in the past and notes since that time she has only been in atrial fibrillation 1 time and that was in 2015.  Controlled on 4/4 17.  Torticollis with cervical kyphosis  Patient wary of injections, and notes she has grown accustomed to it, is willing to work with therapies to improve range of motion   Discussed with therapies ROM 18.  Slow transit constipation  Improving   LOS: 7 days A FACE TO FACE EVALUATION WAS PERFORMED  Leyton Magoon Lorie Phenix 01/20/2021, 8:59 AM

## 2021-01-20 NOTE — Progress Notes (Signed)
ANTICOAGULATION CONSULT NOTE  Pharmacy Consult for Warfarin Indication: mechanical MVR  Allergies  Allergen Reactions  . Erythromycin Nausea And Vomiting  . Valsartan Other (See Comments)    Pt reports caused her depression    Patient Measurements: Height: 5' 6.5" (168.9 cm) Weight: 66.2 kg (145 lb 15.1 oz) IBW/kg (Calculated) : 60.45  Vital Signs: Temp: 97.9 F (36.6 C) (04/04 1426) Temp Source: Oral (04/04 0506) BP: 127/63 (04/04 1426) Pulse Rate: 72 (04/04 1426)  Labs: Recent Labs    01/20/21 0657  LABPROT 30.2*  INR 3.0*    Estimated Creatinine Clearance: 61.6 mL/min (by C-G formula based on SCr of 0.79 mg/dL).   Assessment: Patient is a 69 yo F that presented to the ED with concerns for a stroke.  Transferred to inpatient rehab 3/28.  The patient is on warfarin PTA for a mechanical MVR with an INR goal of 2.5-3.5.   PTA Regimen: 7.5mg  M /Thur and 5mg  all other days  INR today therapeutic at 3. Last CBC 3/28 stable, no bleeding noted.  Goal of Therapy:  INR 2.5-3.5 Monitor platelets by anticoagulation protocol: Yes   Plan:  - Continue Warfarin 5 mg PO daily except 7.5 mg PO Mon/Thur per home regimen - Check INR MWF - Monitor patient for s/sx of bleeding   [employer], Pharm.D., BCPS Clinical Pharmacist  **Pharmacist phone directory can be found on [email or web address] listed under [place].  01/20/2021 3:11 PM

## 2021-01-20 NOTE — Progress Notes (Signed)
Physical Therapy Session Note  Patient Details  Name: [name] [name] MRN: [id number] Date of Birth: 05-01-1951  Today's Date: 01/20/2021 PT Individual Time: 1002-1057 + - 1300-1359 PT Individual Time Calculation (min): 55 min  + 59 min  Short Term Goals: Week 1:  PT Short Term Goal 1 (Week 1): Pt will complete bed mobility without bed features with supervision PT Short Term Goal 2 (Week 1): Pt will complete bed<>chair transfers with CGA and LRAD PT Short Term Goal 3 (Week 1): Pt will ambulate 64ft with CGA and LRAD PT Short Term Goal 4 (Week 1): Pt will initiate stair training  Skilled Therapeutic Interventions/Progress Updates:     1st session: Handoff of care from PT in ortho gym at start of session. No reports of pain. Sit<>stand with CGA to RW and ambulated a few steps to Nustep. Completed 55minutes of Nustep at workload 4, using both BLE/BUE, working on general strengthening and improving overall activity tolerance. Needing cues for taking "longer strides" on Nustep as she tends to lack full extension during movement patterns. Sit<>stand with CGA to RW and she ambulated ~175ft with CGA and RW to main rehab gym - cues needed for maintaining proximity to RW and taking longer strides, especially improving her L>R step length. Seated rest break before beginning alternating toe taps on cones with CGA and RW suport, working on coordination as well as improving step height. Stair training, x4 steps with bilateral hand rails and CGA provided for steadying, demonstrating step-to pattern for both ascent and descent - without a seated rest break, she ambulated back to her room ~166ft with CGA and RW with similar cues provided as above. Removed tennis shoes with totalA while she sat EOB, for time management. Completed sit>supine with supervision and use of hospital bed features. She remained semi-reclined in bed with needs in reach at end of session, bed alarm on.  2nd session: Pt greeted supine in bed,  agreeable to therapy - no reports of pain. Supine<>sit with supervision with bed feautres. Sit<>stand with CGA and RW and stand<>pivot with CGA and RW to w/c. W/c transport to main rehab gym for time management and wheeled inside // bars. Completed the following there-ex in // bars with large mirror for visual feedback: -1x10 overhead reach with 2lb dowel rod -1x10 thoracic rotations each direction with 2kg med ball -1x10 chop/lift patterns with 2kg med ball -2x5 alternating step-up/downson 6inch platform *Required CGA throughout for steadying and cues provided for postural awareness, correct muscle activation, dosage, and sequencing  Manual therapy with moist cervical heat pack - completed R lateral cervical flexion + R rotation with gentle trigger point release to R upper trap - cues for self AROM for R cervical rotation and gentle mandible retraction to improve postural training. Pt subjectively reports improved neck pain and comfort as well as cervical positioning.  Returned to her room with totalA in w/c, stand<>pivot with CGA and RW to EOB. Removed shoes and socks with totalA for time management. Completed sit>supine with supervision with bed features. Remained in bed at end of session, needs within reach, bed alarm on.   Therapy Documentation Precautions:  Precautions Precautions: Fall Precaution Comments: Neurogenic bladder - incontinent (premorbid) Restrictions Weight Bearing Restrictions: No General:    Therapy/Group: Individual Therapy  Yaasir Menken P Salina Stanfield PT 01/20/2021, 7:39 AM

## 2021-01-20 NOTE — Progress Notes (Signed)
Patient ID: [name] [name], female   DOB: Apr 16, 1951, 70 y.o.   MRN: [id number] Follow up with the patient regarding educational needs. Patient asking about follow up [facility] services at discharge and noted SW will coordinate with [facility] agency; no preferred agency noted. Patient also requested list of private duty providers and given a list of private pay agencies in the area. No other concerns, questions or educational needs noted at present. Continue to follow along to discharge. Margarito Liner

## 2021-01-20 NOTE — Progress Notes (Signed)
Physical Therapy Session Note  Patient Details  Name: [name] [name] MRN: [id number] Date of Birth: 01-05-1951  Today's Date: 01/20/2021 PT Individual Time: 0935-1002 PT Individual Time Calculation (min): 27 min   Short Term Goals: Week 1:  PT Short Term Goal 1 (Week 1): Pt will complete bed mobility without bed features with supervision PT Short Term Goal 2 (Week 1): Pt will complete bed<>chair transfers with CGA and LRAD PT Short Term Goal 3 (Week 1): Pt will ambulate 67ft with CGA and LRAD PT Short Term Goal 4 (Week 1): Pt will initiate stair training  Skilled Therapeutic Interventions/Progress Updates:    Patient in bed HOB elevated and supine to sit with S and pt seated to don socks and shoes with CGA.  Requesting to toilet.  Sit to stand with S and ambulated x 15' with RW and CGA.  Patient toileted with A for doffing soiled brief and donning clean brief.  Patient able to complete hygiene and pull up brief and pants with CGA.  Patient ambulated to sink with RW and S and washed hands with S.  Ambulated x 140' with RW and close S to CGA with increased time shuffling pattern with 1 stop to discuss distance.  Patient seated to perform bilateral shoulder flexion with head elevation x 5 reps for postural strengthening.  Also performed sit to stand x 5 to RW with S for LE strength.  Handoff to another therapist for next therapy session in gym.    Therapy Documentation Precautions:  Precautions Precautions: Fall Precaution Comments: Neurogenic bladder - incontinent (premorbid) Restrictions Weight Bearing Restrictions: No  Pain Assessment Pain Score: 0-No pain    Therapy/Group: Individual Therapy  Reginia Naas  Magda Kiel, PT 01/20/2021, 9:56 AM

## 2021-01-20 NOTE — Progress Notes (Signed)
Occupational Therapy Session Note  Patient Details  Name: [name] [name] MRN: [id number] Date of Birth: 28-Nov-1950  Today's Date: 01/20/2021 OT Individual Time: 6269-4854 OT Individual Time Calculation (min): 45 min    Short Term Goals: Week 1:  OT Short Term Goal 1 (Week 1): Pt will ambulate to bathroom with min A to S with RW. OT Short Term Goal 2 (Week 1): Pt will don pants over feet with S. OT Short Term Goal 3 (Week 1): Pt will be able to pull pants over hips with S in standing. OT Short Term Goal 4 (Week 1): Pt will demonstrate improved endurance to tolerate standing for 3 minutes.  Skilled Therapeutic Interventions/Progress Updates:    Treatment session with focus on functional mobility and simple meal prep. Pt received semi-reclined in bed agreeable to therapy session.  Pt completed bed mobility supervision.  Donned socks and shoes seated EOB with setup.  Pt then ambulated to ADL apt with RW with supervision.  Engaged in simple meal prep with focus on increased standing tolerance while also discussing energy conservation strategies and pt current modifications to increase success due to decreased standing tolerance.  Pt ambulated throughout kitchen and obtained items from cabinets and placed "meal" in oven and then removed with supervision to simulate cooking.  Pt completed furniture transfers from low couch with good technique and supervision.  Pt required rest breaks during simulated meal prep.  Pt ambulated back to room supervision, min cues for placement of RW during ambulation.  Pt returned to semi-reclined and left with all needs in reach.  Therapy Documentation Precautions:  Precautions Precautions: Fall Precaution Comments: Neurogenic bladder - incontinent (premorbid) Restrictions Weight Bearing Restrictions: No General:   Vital Signs: Therapy Vitals Temp: 97.9 F (36.6 C) Pulse Rate: 72 Resp: 18 BP: 127/63 Oxygen Therapy SpO2: 98 % O2 Device: Room Air Pain:  Pt  with no c/o pain   Therapy/Group: Individual Therapy  Simonne Come 01/20/2021, 3:50 PM

## 2021-01-21 NOTE — Progress Notes (Signed)
Metoprolol held due to HR 60. Provider will be made aware. Pt also stated " that's the highest it has been all day, I prefer we hold the dose for tonight."

## 2021-01-21 NOTE — Progress Notes (Signed)
Physical Therapy Weekly Progress Note  Patient Details  Name: [name] [name] MRN: [id number] Date of Birth: 04-Feb-1951  Beginning of progress report period: January 14, 2021 End of progress report period: January 21, 2021  Today's Date: 01/21/2021 PT Individual Time: 0375-4360 PT Individual Time Calculation (min): 70 min   Patient has met 4 of 4 short term goals.  Patient is making good progress toward her goals. She is grossly supervision/CGA for functional mobility with MinA for stairs. She maintains forward flexed posture, lateral cervical flexion, shortened B step length- which patient reports are all premorbid.   Patient continues to demonstrate the following deficits muscle weakness, decreased cardiorespiratoy endurance and decreased standing balance, decreased postural control and decreased balance strategies and therefore will continue to benefit from skilled PT intervention to increase functional independence with mobility.  Patient progressing toward long term goals..  Continue plan of care.  PT Short Term Goals Week 1:  PT Short Term Goal 1 (Week 1): Pt will complete bed mobility without bed features with supervision PT Short Term Goal 1 - Progress (Week 1): Met PT Short Term Goal 2 (Week 1): Pt will complete bed<>chair transfers with CGA and LRAD PT Short Term Goal 2 - Progress (Week 1): Met PT Short Term Goal 3 (Week 1): Pt will ambulate 38f with CGA and LRAD PT Short Term Goal 3 - Progress (Week 1): Met PT Short Term Goal 4 (Week 1): Pt will initiate stair training PT Short Term Goal 4 - Progress (Week 1): Met Week 2:  PT Short Term Goal 1 (Week 2): Patient will ambulate >541fwith supervision consistently PT Short Term Goal 2 (Week 2): Patient will complete 14 steps with BHR and CGA consistently per home set up PT Short Term Goal 3 (Week 2): Patient will complete TUG in <2 mins with LRAD  Skilled Therapeutic Interventions/Progress Updates:    Patient received sitting up in  bed, agreeable to PT. She denies pain. Patient able to come sit edge of bed with supervision and don shoes/socks with supervision. Patient ambulating to therapy gym with RW and supervision/CGA. She maintains WBOS, shortened B step length, forward flexed posture. Patient responsive to cues to remain within walker frame, but was not able to maintain throughout entirety of gait bout. PT reviewing home modifications, activity modifications and safety at home with patient. She was able to negotiate 8 steps with B HR and MinA. Step-to maintained throughout. Standing therex in parallel bars: mini squats, marches 3x10, lateral stepping 4# ankle weights x3 laps, HSC 4# ankle weights 3x10. Patient returning to room in wc, remaining up with chair alarm on, call light within reach.   Therapy Documentation Precautions:  Precautions Precautions: Fall Precaution Comments: Neurogenic bladder - incontinent (premorbid) Restrictions Weight Bearing Restrictions: No   Therapy/Group: Individual Therapy  JeKaroline CaldwellPT, DPT, CBIS  01/21/2021, 7:35 AM

## 2021-01-21 NOTE — Progress Notes (Signed)
Physical Therapy Session Note  Patient Details  Name: [name] [name] MRN: [id number] Date of Birth: 29-Sep-1951  Today's Date: 01/21/2021 PT Individual Time: 0305-0340 PT Individual Time Calculation (min): 35 min   Short Term Goals: Week 1:  PT Short Term Goal 1 (Week 1): Pt will complete bed mobility without bed features with supervision PT Short Term Goal 1 - Progress (Week 1): Met PT Short Term Goal 2 (Week 1): Pt will complete bed<>chair transfers with CGA and LRAD PT Short Term Goal 2 - Progress (Week 1): Met PT Short Term Goal 3 (Week 1): Pt will ambulate 6f with CGA and LRAD PT Short Term Goal 3 - Progress (Week 1): Met PT Short Term Goal 4 (Week 1): Pt will initiate stair training PT Short Term Goal 4 - Progress (Week 1): Met  Skilled Therapeutic Interventions/Progress Updates:  Patient seated upright in w/c upon PT arrival. Patient alert and agreeable to PT session. Patient denied pain during session maintaining neck positioning within pain free range. Focus this session on torticollis mgmt. Thermotherapy provided to neck and shoulders at start of session.   Therapeutic Activity: Bed Mobility: Patient performed sit--> supine with supervision/ Mod I. No vc provided as pt is able to relate technique on her own as she is performing. Transfers: Patient performed STS and SPVT w/c--> bed using RW with supervision for safety. No vc required.   Therapeutic Exercise: Patient performed the following exercises with verbal and tactile cues for proper technique. Lateral neck flexion to L and R with 10sec holds x5 Cervical rotation to L and R with 10sec holds x5  Cervical rotation contract/relax with SNAG towel positioning x6 to R with 8 sec holds. Improved range with every other performance. Levator scapula stretch to L and R with 10 sec holds x5 Scap sets held for at least 10-30sec with PROM and progressed to AROM with vertical pillow placement along spine.  Overpressure in pain free  range into R lateral flexion with paired scalene stretch held for 30 sec x3  Patient supine in bed at end of session with brakes locked, bed alarm set, and all needs within reach.   Therapy Documentation Precautions:  Precautions Precautions: Fall Precaution Comments: Neurogenic bladder - incontinent (premorbid) Restrictions Weight Bearing Restrictions: No  Therapy/Group: Individual Therapy  JAlger SimonsPT, DPT 01/21/2021, 6:44 PM

## 2021-01-21 NOTE — Progress Notes (Signed)
[name] PHYSICAL MEDICINE & REHABILITATION PROGRESS NOTE  Subjective/Complaints:  Patient seen sitting up in bed this AM.  She states she slept well overnight.  She states she had a good BM.  She has prune juice if she needs it.  She has questions regarding vision and fatigue.   ROS: Denies CP, SOB, N/V/D  Objective: Vital Signs: Blood pressure (!) 134/48, pulse 79, temperature 97.8 F (36.6 C), resp. rate 19, height 5' 6.5" (1.689 m), weight 66.2 kg, SpO2 97 %. No results found. No results for input(s): WBC, HGB, HCT, PLT in the last 72 hours. No results for input(s): NA, K, CL, CO2, GLUCOSE, BUN, CREATININE, CALCIUM in the last 72 hours.  Intake/Output Summary (Last 24 hours) at 01/21/2021 1002 Last data filed at 01/21/2021 0945 Gross per 24 hour  Intake 480 ml  Output --  Net 480 ml        Physical Exam: BP (!) 134/48 (BP Location: Left Arm)   Pulse 79   Temp 97.8 F (36.6 C)   Resp 19   Ht 5' 6.5" (1.689 m)   Wt 66.2 kg   SpO2 97%   BMI 23.20 kg/m   Constitutional: No distress . Vital signs reviewed. HENT: Normocephalic.  Atraumatic. Eyes: EOMI. No discharge. Cardiovascular: No JVD.  RRR. Respiratory: Normal effort.  No stridor.  Bilateral clear to auscultation. GI: Non-distended.  BS +. Skin: Warm and dry.  Intact. Psych: Normal mood.  Normal behavior. Musc: No edema in extremities.  No tenderness in extremities. Cervical kyphosis, stable Neurologic: Alert Motor: 4-4+/5 throughout, right slightly weaker than left + Torticollis, stable  Assessment/Plan: 1. Functional deficits which require 3+ hours per day of interdisciplinary therapy in a comprehensive inpatient rehab setting.  Physiatrist is providing close team supervision and 24 hour management of active medical problems listed below.  Physiatrist and rehab team continue to assess barriers to discharge/monitor patient progress toward functional and medical goals   Care Tool:  Bathing    Body parts  bathed by patient: Right arm,Left arm,Chest,Abdomen,Front perineal area,Buttocks,Right upper leg,Left upper leg,Face,Right lower leg,Left lower leg         Bathing assist Assist Level: Minimal Assistance - Patient > 75%     Upper Body Dressing/Undressing Upper body dressing   What is the patient wearing?: Pull over shirt    Upper body assist Assist Level: Set up assist    Lower Body Dressing/Undressing Lower body dressing      What is the patient wearing?: Underwear/pull up,Pants     Lower body assist Assist for lower body dressing: Maximal Assistance - Patient 25 - 49%     Toileting Toileting    Toileting assist Assist for toileting: Supervision/Verbal cueing     Transfers Chair/bed transfer  Transfers assist  Chair/bed transfer activity did not occur: Safety/medical concerns  Chair/bed transfer assist level: Supervision/Verbal cueing     Locomotion Ambulation   Ambulation assist      Assist level: Supervision/Verbal cueing Assistive device: Walker-rolling Max distance: 175   Walk 10 feet activity   Assist     Assist level: Supervision/Verbal cueing Assistive device: Walker-rolling   Walk 50 feet activity   Assist Walk 50 feet with 2 turns activity did not occur: Safety/medical concerns  Assist level: Supervision/Verbal cueing Assistive device: Walker-rolling    Walk 150 feet activity   Assist Walk 150 feet activity did not occur: Safety/medical concerns  Assist level: Supervision/Verbal cueing Assistive device: Walker-rolling    Walk 10 feet on  uneven surface  activity   Assist Walk 10 feet on uneven surfaces activity did not occur: Safety/medical concerns         Wheelchair     Assist Will patient use wheelchair at discharge?: No   Wheelchair activity did not occur: N/A         Wheelchair 50 feet with 2 turns activity    Assist    Wheelchair 50 feet with 2 turns activity did not occur: N/A        Wheelchair 150 feet activity     Assist  Wheelchair 150 feet activity did not occur: N/A        Medical Problem List and Plan: 1.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 Antithrombotics: -DVT/anticoagulation:Coumadin (2.5-3.5)  INR therapeutic on 4/5 -antiplatelet therapy: Aspirin 81 mg daily 3. Pain Management:Tylenol as needed 4. Mood:Provide emotional support -antipsychotic agents: N/A 5. Neuropsych: This patientiscapable of making decisions on hisown behalf. 6. Skin/Wound Care:Routine skin checks 7. Fluids/Electrolytes/Nutrition:Routine in and outs  BMP within acceptable range on 3/30 8. UTI.   Urinalysis positive nitrite, Urine culture showing multiple species  Completed course of antibiotics on 4/1 9. Hypertension. Lopressor 25 mg twice daily.   Relatively controlled on 4/5  Monitor with increased mobility 10. Hypothyroidism. TSH 0.371. Continue thyroid 90 mg daily 11. Hyperlipidemia. Lipitor 12. History of severe mitral regurgitation status post metal mitral valve replacement 2013. Continue chronic Coumadin. 13. Diastolic congestive heart failure. Monitor for any signs of fluid overload Filed Weights   01/13/21 1700 01/19/21 0500 01/20/21 0506  Weight: 64 kg 66.4 kg 66.2 kg   Stable on 4/5 14. COPD. Check oxygen saturations every shift  No breathing issues on 4/5  To monitor with increased exertion 15. 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 Patient has never been on modifying therapy due to concerns for potential side effects but has not had any reported documented MS flare since diagnosis.  Patient states she urinates every 5 minutes and wears depends at home, which were not available to her, daughter-in-law to bring in  Pure wick nightly 16.  Paroxysmal Atrial Fibrillation- lopressor, cont Warfarin  Patient states she has had a  maze, ablation in the past and notes since that time she has only been in atrial fibrillation 1 time and that was in 2015.  Controlled on 4/5 17.  Torticollis with cervical kyphosis  Patient wary of injections, and notes she has grown accustomed to it, is willing to work with therapies to improve range of motion   Discussed with therapies ROM 18.  Slow transit constipation  May take prune juice prn  Improving   LOS: 8 days A FACE TO FACE EVALUATION WAS PERFORMED  [name] [name] Lorie Phenix 01/21/2021, 10:02 AM

## 2021-01-21 NOTE — Progress Notes (Signed)
Occupational Therapy Session Note  Patient Details  Name: [name] [name] MRN: [id number] Date of Birth: Mar 08, 1951  Today's Date: 01/21/2021 OT Individual Time: 1315-1409 OT Individual Time Calculation (min): 54 min    Short Term Goals: Week 1:  OT Short Term Goal 1 (Week 1): Pt will ambulate to bathroom with min A to S with RW. OT Short Term Goal 2 (Week 1): Pt will don pants over feet with S. OT Short Term Goal 3 (Week 1): Pt will be able to pull pants over hips with S in standing. OT Short Term Goal 4 (Week 1): Pt will demonstrate improved endurance to tolerate standing for 3 minutes.  Skilled Therapeutic Interventions/Progress Updates:    Pt received sitting in recliner, finishing lunch. Pt agreeable to OT session, declining ADLs. Pt completed 50 ft of functional mobility with the RW, challenging dynamic standing balance and endurance by ascending/descending a ramp with RW. Pt required cueing for RW management, upright posture, and midline orientation of the head and trunk. Time spend with manual facilitation of pt's cervical range of motion- especially for R rotation and lateral flexion. Pt used the BITS to complete visual scanning and functional reaching without UE support on the RW. Visual scanning component performed d/t pt c/o of eye fatigue with reading. 3 min trial, 84% accuracy and 3.04 sec reaction time. Pt took seated rest break and then completed bell cancellation test to work on visual scanning strategies and eye fatigue. Pt cued for proper L to R visual scanning strategies. Pt was returned to her room and left sitting up with all needs met. Chair alarm set.   Therapy Documentation Precautions:  Precautions Precautions: Fall Precaution Comments: Neurogenic bladder - incontinent (premorbid) Restrictions Weight Bearing Restrictions: No  Therapy/Group: Individual Therapy  Curtis Sites 01/21/2021, 6:38 AM

## 2021-01-22 LAB — PROTIME-INR
INR: 3.9 — ABNORMAL HIGH (ref 0.8–1.2)
Prothrombin Time: 36.7 seconds — ABNORMAL HIGH (ref 11.4–15.2)

## 2021-01-22 NOTE — Progress Notes (Signed)
Physical Therapy Session Note  Patient Details  Name: [name] [name] MRN: [id number] Date of Birth: 1951-01-24  Today's Date: 01/22/2021 PT Individual Time: 1530-1610 PT Individual Time Calculation (min): 40 min   Short Term Goals: Week 2:  PT Short Term Goal 1 (Week 2): Patient will ambulate >91ft with supervision consistently PT Short Term Goal 2 (Week 2): Patient will complete 14 steps with BHR and CGA consistently per home set up PT Short Term Goal 3 (Week 2): Patient will complete TUG in <2 mins with LRAD  Skilled Therapeutic Interventions/Progress Updates:    Ptient in w/c and reports worked very hard last session performing stairs multiple times and walking long distances.  She reports would like a little easier session.  Patient pushed in w/c to ortho gym for time management.  Performed sit to stand with CGA to S to RW.  Ambulated 5' to Nu Step.  Performed 10 minutes on level 4 with UE/LE cues for pacing and lengthening strides.  Patient ambulated to mat 10' and sit to supine with S.  Performed supine stretch single knee to chest with towel 30 sec, then assisted hamstring stretch x 30 sec.  Lateral trunk rotation x 5, hooklying marching x 10, bridging x 10.  Arms out to sides for pec stretch, then performed PROM cervical rotation and lateral flexion within tolerable limits as reports L shoulder stretch with R side bending.  Patient supine to sit S with effort.  Seated to several seconds due to reports feeling little dizzy, but calmed within 30 seconds.  Patient sit to stand to RW and ambulated up and down ramp with cues for step height with CGA.  Patient in w/c assisted to room and performed stand step to bed with S.  Sit to supine with S after seated EOB to lean down to doff shoes.  Discussed safer if further back on bed when reaching to the floor.  Patient left with call bell and needs in reach and bed alarm active.   Therapy Documentation Precautions:  Precautions Precautions:  Fall Precaution Comments: Neurogenic bladder - incontinent (premorbid) Restrictions Weight Bearing Restrictions: No Pain: Pain Assessment Pain Score: 0-No pain   Therapy/Group: Individual Therapy  Reginia Naas  Magda Kiel, PT 01/22/2021, 3:41 PM

## 2021-01-22 NOTE — Progress Notes (Signed)
Physical Therapy Session Note  Patient Details  Name: [name] [name] MRN: [id number] Date of Birth: 07-11-1951  Today's Date: 01/22/2021 PT Individual Time: 8280-0349 PT Individual Time Calculation: 45 min  Short Term Goals: Week 2:  PT Short Term Goal 1 (Week 2): Patient will ambulate >11ft with supervision consistently PT Short Term Goal 2 (Week 2): Patient will complete 14 steps with BHR and CGA consistently per home set up PT Short Term Goal 3 (Week 2): Patient will complete TUG in <2 mins with LRAD  Skilled Therapeutic Interventions/Progress Updates:    Pt received supine in bed. Did not report any pain and agreeable to therapy. Transferred to sitting EOB with supervision. Donned socks and shoes independently with supervision. Transferred to standing with RW CGA. Gait training to therapy gym ~224ft CGA with RW with verbal cuing required to "stay inside of walker" and "look up when walking" due to forward flexed posture. Short B step length observed along with limited foot clearance. Stair training on 4 6" steps CGA with B HR to mimic pt home environment. Pt able to complete with minimal difficulty 2x consecutively. Pt verbalized having 14 steps with B HR inside of home to reach 2nd floor where her bedroom is, so wheeled to stairwell in hallway for energy conservation to practice with additional steps. 12 stairs negotiated CGA with B HR; descending performed first as pt verbalized "it's harder to go down than up for me." Pt performed step to pattern up and down, and demonstrated safety awareness of ensuring entire foot is on each step before negotiating additional steps. Pt wheeled back to therapy gym for time management. Dynamic balance training via lateral stepping CGA and HR use within // bars with 2 "hurdles" (boxing gloves) in the way; performed 6x each way. Verbal cuing required again to "look up" as pt defaults to forward flexed posture throughout session unless prompted otherwise. Pt  demonstrated difficulty taking large enough steps each way initially to have enough clearance for both feet to land over hurdle, and demonstrated circumduction compensation initially. On last few reps, pt able to resolve both of these compensations after cuing to "get close to the glove and then take a big step" and "step over, not around." Thoracic extension CGA with ball "roll up" on wall 2x30" hold at max height to combat forward flexion for postural retraining; cuing to "go as high as your can without feeling in pain." Followed with "explosive wall pushups" CGA as pt verbalizes this is an exercise she loves because it "feels good;" performed 6x. Pt wheeled back to room for time management and left up in [place]. Needs within reach.  Therapy Documentation Precautions:  Precautions Precautions: Fall Precaution Comments: Neurogenic bladder - incontinent (premorbid) Restrictions Weight Bearing Restrictions: No    Therapy/Group: Individual Therapy  Eleonore Chiquito, SPT 01/22/2021, 7:32 AM

## 2021-01-22 NOTE — Progress Notes (Signed)
Occupational Therapy Weekly Progress Note  Patient Details  Name: [name] [name] MRN: [id number] Date of Birth: [birth year]  Beginning of progress report period: January 14, 2021 End of progress report period: January 22, 2021  Today's Date: 01/22/2021 OT Individual Time: 1040-1100 and  1130-1215 OT Individual Time Calculation (min): 20 min and 45 min    Patient has met 4 of 4 short term goals.  Pt is progressing well with her rehab. She has gained strength and balance and is now able to do most of her self care at a supervision level. She lives alone and has to climb a flight of stairs independently, so she will need to be fairly independent.    Patient continues to demonstrate the following deficits: muscle weakness and muscle joint tightness, decreased cardiorespiratoy endurance and decreased standing balance, decreased postural control and decreased balance strategies and therefore will continue to benefit from skilled OT intervention to enhance overall performance with BADL and iADL.  Patient progressing toward long term goals..  Continue plan of care.  OT Short Term Goals Week 1:  OT Short Term Goal 1 (Week 1): Pt will ambulate to bathroom with min A to S with RW. OT Short Term Goal 1 - Progress (Week 1): Met OT Short Term Goal 2 (Week 1): Pt will don pants over feet with S. OT Short Term Goal 2 - Progress (Week 1): Met OT Short Term Goal 3 (Week 1): Pt will be able to pull pants over hips with S in standing. OT Short Term Goal 3 - Progress (Week 1): Met OT Short Term Goal 4 (Week 1): Pt will demonstrate improved endurance to tolerate standing for 3 minutes. OT Short Term Goal 4 - Progress (Week 1): Met Week 2:  OT Short Term Goal 1 (Week 2): STGs = LTGs  Skilled Therapeutic Interventions/Progress Updates:    Visit 1:   Pain: no c/o pain Pt received in bed ready for therapy. She worked on moving from bed to arm chair with Rw with S.  From arm chair, she worked on a variety of UE  stretches using a towel to open up her tight chest and neck area and AROM for sh strength.  Pt tolerated exercises well and then transferred back to bed to rest until next session. Bed alarm set and all needs met.   Visit 2: Pain: no c/o pain  Pt received in bed with daughter in law in the room. Pt sat to EOB and doffed slip on socks and donned socks and shoes independently. She then used her RW to ambulate to the toilet with S.  Family left and pt completed toileting with distant S and then walked to sink to wash hands.  Sat in arm chair to rest while therapist obtained a wooden step.  Pt stood up at end of her bed to use the foot board for UE support while she worked on stepping up and down off of bench and standing on top of bench and tapping off of bench.  Pt did well with this exercise and then stood upright without UE support for a few minutes.  Used Rw to transfer to bed. Removed socks and shoes and donned socks.  Bed alarm set and all needs met.     Therapy Documentation Precautions:  Precautions Precautions: Fall Precaution Comments: Neurogenic bladder - incontinent (premorbid) Restrictions Weight Bearing Restrictions: No    Vital Signs: Therapy Vitals Temp: 97.9 F (36.6 C) Pulse Rate: (!) 40 Resp: 18 BP: Marland Kitchen)  148/96 Patient Position (if appropriate): Lying Oxygen Therapy SpO2: 99 % O2 Device: Room Air    ADL: ADL Eating: Set up Grooming: Setup Upper Body Bathing: Setup Lower Body Bathing: Minimal assistance Upper Body Dressing: Setup Lower Body Dressing: Maximal assistance Toileting: Maximal assistance Where Assessed-Toileting: Bedside Commode Toilet Transfer: Minimal assistance Toilet Transfer Method: Stand pivot  Therapy/Group: Individual Therapy  [place] 01/22/2021, 8:58 AM

## 2021-01-22 NOTE — Progress Notes (Signed)
[facility] PHYSICAL MEDICINE & REHABILITATION PROGRESS NOTE  Subjective/Complaints:  Patient seen sitting up in her chair this AM.  She states she slept well overnight.  She has questions regarding her pulse rate.  ROS: Denies CP, SOB, N/V/D  Objective: Vital Signs: Blood pressure (!) 148/96, pulse (!) 40, temperature 97.9 F (36.6 C), resp. rate 18, height 5' 6.5" (1.689 m), weight 63.4 kg, SpO2 99 %. No results found. No results for input(s): WBC, HGB, HCT, PLT in the last 72 hours. No results for input(s): NA, K, CL, CO2, GLUCOSE, BUN, CREATININE, CALCIUM in the last 72 hours.  Intake/Output Summary (Last 24 hours) at 01/22/2021 0919 Last data filed at 01/22/2021 0836 Gross per 24 hour  Intake 1320 ml  Output --  Net 1320 ml        Physical Exam: BP (!) 148/96 (BP Location: Left Arm)   Pulse (!) 40   Temp 97.9 F (36.6 C)   Resp 18   Ht 5' 6.5" (1.689 m)   Wt 63.4 kg   SpO2 99%   BMI 22.22 kg/m   Constitutional: No distress . Vital signs reviewed. HENT: Normocephalic.  Atraumatic. Eyes: EOMI. No discharge. Cardiovascular: No JVD.  Irregular rhythm Respiratory: Normal effort.  No stridor.  Bilateral clear to auscultation. GI: Non-distended.  BS +. Skin: Warm and dry.  Intact. Psych: Normal mood.  Normal behavior. Musc: No edema in extremities.  No tenderness in extremities. Cervical kyphosis, unchanged Neurologic: Alert Motor: 4-4+/5 throughout, right slightly weaker than left, unchanged + Torticollis, stable  Assessment/Plan: 1. Functional deficits which require 3+ hours per day of interdisciplinary therapy in a comprehensive inpatient rehab setting.  Physiatrist is providing close team supervision and 24 hour management of active medical problems listed below.  Physiatrist and rehab team continue to assess barriers to discharge/monitor patient progress toward functional and medical goals   Care Tool:  Bathing    Body parts bathed by patient: Right  arm,Left arm,Chest,Abdomen,Front perineal area,Buttocks,Right upper leg,Left upper leg,Face,Right lower leg,Left lower leg         Bathing assist Assist Level: Minimal Assistance - Patient > 75%     Upper Body Dressing/Undressing Upper body dressing   What is the patient wearing?: Pull over shirt    Upper body assist Assist Level: Set up assist    Lower Body Dressing/Undressing Lower body dressing      What is the patient wearing?: Underwear/pull up,Pants     Lower body assist Assist for lower body dressing: Maximal Assistance - Patient 25 - 49%     Toileting Toileting    Toileting assist Assist for toileting: Supervision/Verbal cueing     Transfers Chair/bed transfer  Transfers assist  Chair/bed transfer activity did not occur: Safety/medical concerns  Chair/bed transfer assist level: Supervision/Verbal cueing     Locomotion Ambulation   Ambulation assist      Assist level: Contact Guard/Touching assist Assistive device: Walker-rolling Max distance: 150   Walk 10 feet activity   Assist     Assist level: Supervision/Verbal cueing Assistive device: Walker-rolling   Walk 50 feet activity   Assist Walk 50 feet with 2 turns activity did not occur: Safety/medical concerns  Assist level: Contact Guard/Touching assist Assistive device: Walker-rolling    Walk 150 feet activity   Assist Walk 150 feet activity did not occur: Safety/medical concerns  Assist level: Contact Guard/Touching assist Assistive device: Walker-rolling    Walk 10 feet on uneven surface  activity   Assist Walk 10 feet  on uneven surfaces activity did not occur: Safety/medical concerns         Wheelchair     Assist Will patient use wheelchair at discharge?: No   Wheelchair activity did not occur: N/A         Wheelchair 50 feet with 2 turns activity    Assist    Wheelchair 50 feet with 2 turns activity did not occur: N/A       Wheelchair 150 feet  activity     Assist  Wheelchair 150 feet activity did not occur: N/A        Medical Problem List and Plan: 1.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 Please see conference note from today as well.  2. Antithrombotics: -DVT/anticoagulation:Coumadin (2.5-3.5)  INR supratherapeutic on 4/6 -antiplatelet therapy: Aspirin 81 mg daily 3. Pain Management:Tylenol as needed 4. Mood:Provide emotional support -antipsychotic agents: N/A 5. Neuropsych: This patientiscapable of making decisions on hisown behalf. 6. Skin/Wound Care:Routine skin checks 7. Fluids/Electrolytes/Nutrition:Routine in and outs  BMP within acceptable range on 3/30 8. UTI.   Urinalysis positive nitrite, Urine culture showing multiple species  Completed course of antibiotics on 4/1 9. Hypertension. Lopressor 25 mg twice daily.   Controlled on 4/6  Monitor with increased mobility 10. Hypothyroidism. TSH 0.371. Continue thyroid 90 mg daily 11. Hyperlipidemia. Lipitor 12. History of severe mitral regurgitation status post metal mitral valve replacement 2013. Continue chronic Coumadin. 13. Diastolic congestive heart failure. Monitor for any signs of fluid overload Filed Weights   01/19/21 0500 01/20/21 0506 01/22/21 0500  Weight: 66.4 kg 66.2 kg 63.4 kg   ?Reliability on 4/6 14. COPD. Check oxygen saturations every shift  No breathing issues on 4/6  To monitor with increased exertion 15. 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 Patient has never been on modifying therapy due to concerns for potential side effects but has not had any reported documented MS flare since diagnosis.  Patient states she urinates every 5 minutes  and wears depends at home, which were not available to her, daughter-in-law to bring in  Pure wick nightly 16.  Paroxysmal Atrial Fibrillation- lopressor, cont Warfarin  Patient states she has had a maze, ablation in the past and notes since that time she has only been in atrial fibrillation 1 time and that was in 2015.  Will order ECG 17.  Torticollis with cervical kyphosis  Patient wary of injections, and notes she has grown accustomed to it, is willing to work with therapies to improve range of motion   Discussed with therapies ROM 18.  Slow transit constipation  May take prune juice prn  Improving   LOS: 9 days A FACE TO FACE EVALUATION WAS PERFORMED  Jasmeet Gehl Lorie Phenix 01/22/2021, 9:19 AM

## 2021-01-22 NOTE — Progress Notes (Signed)
Patient ID: [name] [name], female   DOB: 01/04/1951, 70 y.o.   MRN: [id number]  Met with pt to update team conference progress toward her goals of mod/i level. Main goal this week and beginning of next is working on stairs since she has 5-10 outside and inside her home. She is in agreement with this. She prefers [facility] for follow up and will make referral. She has all needed equipment. Work toward discharge for next Thursday 4/14.

## 2021-01-22 NOTE — Progress Notes (Signed)
Physical Therapy Session Note  Patient Details  Name: [name] [name] MRN: [id number] Date of Birth: 30-Oct-1950  Today's Date: 01/22/2021 PT Individual Time: 7902-4097 PT Individual Time Calculation (min): 30 min   Short Term Goals: Week 2:  PT Short Term Goal 1 (Week 2): Patient will ambulate >56ft with supervision consistently PT Short Term Goal 2 (Week 2): Patient will complete 14 steps with BHR and CGA consistently per home set up PT Short Term Goal 3 (Week 2): Patient will complete TUG in <2 mins with LRAD  Skilled Therapeutic Interventions/Progress Updates:     Pt received sitting in w/c at start of session, agreeable to therapy with no reports of pain. W/c transport for time management to main rehab gym. Completed stand<>pivot transfer with CGA and NO AD (per pt's request), from w/c to mat table - increased forward flexed trunk and unsteadiness present, educated on importance of using RW at all times for functional transfers and pt in agreement. Completed sit<>stand with supervision to RW from mat table. Completed ball toss with therapist providing CGA/minA for balance and +2 assist for 2nd person tossing the ball - ball tossed in multi plane directions facilitating midline and overhead reaching. She then completed NMR with blue air-ex foam pad, unsupported standing with feet apart, feet together. Also with eyes open/closed - increased difficulty with eyes closed > feet together with posterior bias noted. Stand<>pivot transfer with CGA and RW and transported back to her room with totalA in w/c for time management. Ambulated a few feet within her room with CGA and RW and completed stand>sit to EOB with RW and CGA. Removed tennis shoes with totalA for time management. Completed sit>supine mod I with hospital bed features. She remained supine in bed with needs in reach at end of session, bed alarm on.  Therapy Documentation Precautions:  Precautions Precautions: Fall Precaution Comments:  Neurogenic bladder - incontinent (premorbid) Restrictions Weight Bearing Restrictions: No General:    Therapy/Group: Individual Therapy  Ilir Mahrt P Rooney Swails PT 01/22/2021, 7:42 AM

## 2021-01-22 NOTE — Patient Care Conference (Signed)
Inpatient RehabilitationTeam Conference and Plan of Care Update Date: 01/22/2021   Time: 11:11 AM    Patient Name: [name] [name]      Medical Record Number: [id number]  Date of Birth: 1951-09-14 Sex: Female         Room/Bed: 4M07C/4M07C-01 Payor Info: Payor: MEDICARE / Plan: MEDICARE PART A AND B / Product Type: *No Product type* /    Admit Date/Time:  01/13/2021  4:56 PM  Primary Diagnosis:  Brainstem infarct, acute [facility])  Hospital Problems: Principal Problem:   Brainstem infarct, acute ([place]) Active Problems:   Left pontine cerebrovascular accident ([place])   Torticollis   Essential hypertension   Acute lower UTI   PAF (paroxysmal atrial fibrillation) ([place])   Slow transit constipation    Expected Discharge Date: Expected Discharge Date: 01/30/21  Team Members Present: Physician leading conference: Dr. Delice Lesch Care Coodinator Present: Dorien Chihuahua, RN, BSN, CRRN;Becky Dupree, LCSW Nurse Present: Dorien Chihuahua, RN PT Present: Ginnie Smart, PT OT Present: Meriel Pica, OT PPS Coordinator present : Gunnar Fusi, SLP     Current Status/Progress Goal Weekly Team Focus  Bowel/Bladder   Pt is cont x2, incont of urine at night. LBM 4/5  Pt will be contx2  Q2h toileting/PRN   Swallow/Nutrition/ Hydration             ADL's   supervision overall  Mod I with BASIC ADLs  ADL training, balance, endurance, pt education   Mobility   supervision bed mobility, CGA transfers with RW, CGA gait ~160ft with RW, minA 4 steps  mod I (except stairs, supervision)  Gait and STAIRS, general strengthening, cervical and postural training, higher level balance, pt education   Communication             Safety/Cognition/ Behavioral Observations            Pain   Pt c/o neck pain. PRN tyleonal effective  Pt will be free of pain  Assess pain qshift/prn   Skin   Pt has no obvious signs of breakdown and infection  Pts skin will be free of infection and no breakdown.  Assess skin  qshift/prn     Discharge Planning:  Pt pleased with doing well and will have assist from neighbor's and daughter in-law intermittently   Team Discussion: [facility]cardia at [facility]; MD ordered baseline EKG. Neurogenic bladder managed. Good progress noted with therapy but continued need for cues for safety. Reported night-terrors for past two nights with undetermined cause.  Patient on target to meet rehab goals: yes, currently min assit for lower body care and supervision overall for OT. Close supervision for transfers and able to ambulate 150' with  RW and manage 4 steps with CGA. Mod I goals set for discharge.  *See Care Plan and progress notes for long and short-term goals.   Revisions to Treatment Plan:   Teaching Needs: Transfers, toileting, safety tips, medications, etc.  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  Barriers to Discharge: Medical stability;Decreased family/caregiver support   Possible Resolutions to [employer] Focus: Therapies, ROM for torticollis, diapers for neuogenic bladder, follow INR, ECG ordered for PAF, follow wiehgts   Continued Need for Acute Rehabilitation Level of Care: The patient requires daily medical management by a physician with specialized training  in physical medicine and rehabilitation for the following reasons: Direction of a multidisciplinary physical rehabilitation program to maximize functional independence : Yes Medical management of patient stability for increased activity during participation in an intensive rehabilitation regime.: Yes Analysis of laboratory values and/or radiology reports with any subsequent need for medication adjustment and/or  medical intervention. : Yes   I attest that I was present, lead the team conference, and concur with the assessment and plan of the team.   Dorien Chihuahua B 01/22/2021, 3:31 PM

## 2021-01-22 NOTE — Progress Notes (Signed)
ANTICOAGULATION CONSULT NOTE  Pharmacy Consult for Warfarin Indication: mechanical MVR  Allergies  Allergen Reactions  . Erythromycin Nausea And Vomiting  . Valsartan Other (See Comments)    Pt reports caused her depression    Patient Measurements: Height: 5' 6.5" (168.9 cm) Weight: 63.4 kg (139 lb 12.4 oz) IBW/kg (Calculated) : 60.45  Vital Signs: Temp: 97.9 F (36.6 C) (04/06 1309) Temp Source: Oral (04/06 0935) BP: 124/46 (04/06 1309) Pulse Rate: 64 (04/06 1309)  Labs: Recent Labs    01/20/21 0657 01/22/21 0533  LABPROT 30.2* 36.7*  INR 3.0* 3.9*    Estimated Creatinine Clearance: 61.6 mL/min (by C-G formula based on SCr of 0.79 mg/dL).   Assessment: Patient is a 70 yo F that presented to the ED with concerns for a stroke.  Transferred to inpatient rehab 3/28.  The patient is on warfarin PTA for a mechanical MVR with an INR goal of 2.5-3.5.   PTA Regimen: 7.5mg  M /Thur and 5mg  all other days  INR is supratherapeutic at 3.9 today on home dosing. No interactions noted and has been eating well. Last CBC 3/28 stable, no bleeding noted.  Goal of Therapy:  INR 2.5-3.5 Monitor platelets by anticoagulation protocol: Yes   Plan:  - No warfarin tonight - consider decreasing total weekly dose as INR trends back down - Check INR tomorrow, may need to reconsider daily INR for now - Monitor patient for s/sx of bleeding   Thank you for involving pharmacy in this patient's care.  Renold Genta, PharmD, BCPS Clinical Pharmacist Clinical phone for 01/22/2021 until 3p is [phone] 01/22/2021 1:11 PM  **Pharmacist phone directory can be found on [email or web address] listed under [place]**

## 2021-01-23 DIAGNOSIS — I493 Ventricular premature depolarization: Secondary | ICD-10-CM

## 2021-01-23 DIAGNOSIS — R9431 Abnormal electrocardiogram [ECG] [EKG]: Secondary | ICD-10-CM

## 2021-01-23 DIAGNOSIS — J449 Chronic obstructive pulmonary disease, unspecified: Secondary | ICD-10-CM

## 2021-01-23 LAB — PROTIME-INR
INR: 2.8 — ABNORMAL HIGH (ref 0.8–1.2)
Prothrombin Time: 28.2 seconds — ABNORMAL HIGH (ref 11.4–15.2)

## 2021-01-23 MED ORDER — WARFARIN SODIUM 5 MG PO TABS
5.0000 mg | ORAL_TABLET | Freq: Once | ORAL | Status: AC
  Administered 2021-01-23: 5 mg via ORAL
  Filled 2021-01-23: qty 1

## 2021-01-23 NOTE — Progress Notes (Signed)
Physical Therapy Session Note  Patient Details  Name: [name] [name] MRN: [id number] Date of Birth: Apr 19, 1951  Today's Date: 01/23/2021 PT Individual Time: 7494-4967 + 1445 - 1525 PT Individual Time Calculation (min): 40 min  + 40 min  Short Term Goals: Week 2:  PT Short Term Goal 1 (Week 2): Patient will ambulate >33ft with supervision consistently PT Short Term Goal 2 (Week 2): Patient will complete 14 steps with BHR and CGA consistently per home set up PT Short Term Goal 3 (Week 2): Patient will complete TUG in <2 mins with LRAD  Skilled Therapeutic Interventions/Progress Updates:     1st session: Pt greeted supine in bed, awake and agreeable to therapy - no reports of pain. She does report generalized muscle soreness from a very busy day of therapies yesterday, requesting an "easier" session. Supine<>sit mod I with bed features. Donned sock and tennis shoes with totalA for time management. Completed stand<>pivot transfer with supervision and RW to her w/c and wheeled to main rehab gym for time management. Completed additional stand<>pivot transfer with supervision and RW to mat table. Performed sit>supine with supervision to mat table - upon initial supine positioning, pt c/o "dizzy spell." No nystagmus observed and allowed + time for symptoms to resolve but patient requesting to return to sitting position as she was uncomfortable. Able to go from supine to sitting on mat table with supervision, slowed and effortful. Pt reports dizziness persisted and again, allowed time for resolution which required ~3-4 minutes. Again, no nystagmus observed. Pt reports h/o ?vertigo. Due to dizziness, deferred standing and gait but pt agreeable to seated activity. Performed overhead reaching with 4# dowel rod with cues for increasing thoracic extension. Also performed side twists with 4# dowel rod, promoting thoracic rotation. Limited thoracic ROM impacting these activities. Stand<>pivot transfer with CGA and  RW back to her w/c and she was wheeled back to her room. Completed additional stand<>pivot with CGA and RW back to her bed. Removed tennis shoes with totalA for time management. Completed sit>supine mod I with bed features. Pt denies dizziness. Remained supine in bed at end of session with bed alarm on and needs within reach.  2nd session: Pt greeted seated in w/c, agreeable to therapy. No reports of pain but pt requesting limited standing exercises due to her dizzy spell from this AM. W/c transport to main rehab gym for time management. Completed the following seated there-ex: -2x20 chest press with 4# dowel rod -2x20 rows with 4# dowel rod -2x10 bicep curls with 4# dowel rod  Applied moist heat pack to R cervical region and pt instructed on postural awareness with emphasis on R lateral flexion and L cervical rotation in efforts to improve her torticollis. Pt reports subjective improvement as well as reduced stiffness. Encouraged her to practice cervical ROM exercises outside of therapy to improve carryover.  Completed x6 minutes of Nustep at workload 4, using both BUE/BLE, emphasizing longer strides and hip/knee extension. Cervical heat pack remained on during Nustep. Skin c/d/i after completion with mild redness.   Transported pt back to her room with totalA in w/c. Pt requesting to use toilet to void. Stand<>Pivot transfer with CGA and RW from w/c to 3-1 [facility] that was placed over toilet. Instructed pt to use pull cord and have nursing staff assist with toileting needs due to time constraints. NT made aware at end of session and pt voiced understanding of pull cord.   Therapy Documentation Precautions:  Precautions Precautions: Fall Precaution Comments: Neurogenic bladder -  incontinent (premorbid) Restrictions Weight Bearing Restrictions: No General:    Therapy/Group: Individual Therapy  Sage Hammill P Carol Theys  PT 01/23/2021, 7:35 AM

## 2021-01-23 NOTE — Progress Notes (Signed)
Patient claims she had vertigo early this morning when she went to bathroom but resolved and another episode when she was with therapy when she layed flat on the mat and resolved when she got up.  Patient asking for Meclizine. Dan PA notified.

## 2021-01-23 NOTE — Progress Notes (Signed)
[facility] PHYSICAL MEDICINE & REHABILITATION PROGRESS NOTE  Subjective/Complaints:  Patient seen sitting up, working with therapy this morning.  She states she slept well overnight.  She states she has had a "fizzy" sensation behind her left ear since her Covid vaccine, has been stable..  ROS: Denies CP, SOB, N/V/D  Objective: Vital Signs: Blood pressure 126/74, pulse 82, temperature 97.8 F (36.6 C), resp. rate 20, height 5' 6.5" (1.689 m), weight 63.7 kg, SpO2 97 %. No results found. No results for input(s): WBC, HGB, HCT, PLT in the last 72 hours. No results for input(s): NA, K, CL, CO2, GLUCOSE, BUN, CREATININE, CALCIUM in the last 72 hours.  Intake/Output Summary (Last 24 hours) at 01/23/2021 1228 Last data filed at 01/23/2021 0830 Gross per 24 hour  Intake 779 ml  Output --  Net 779 ml        Physical Exam: BP 126/74 (BP Location: Left Arm)   Pulse 82   Temp 97.8 F (36.6 C)   Resp 20   Ht 5' 6.5" (1.689 m)   Wt 63.7 kg   SpO2 97%   BMI 22.33 kg/m   Constitutional: No distress . Vital signs reviewed. HENT: Normocephalic.  Atraumatic. Eyes: EOMI. No discharge. Cardiovascular: No JVD.  RRR. Respiratory: Normal effort.  No stridor.  Bilateral clear to auscultation. GI: Non-distended.  BS +. Skin: Warm and dry.  Intact. Psych: Normal mood.  Normal behavior. Musc: No edema in extremities.  No tenderness in extremities. Cervical kyphosis, stable Neurologic: Alert Motor: 4-4+/5 throughout, right slightly weaker than left, unchanged + Torticollis, unchanged  Assessment/Plan: 1. Functional deficits which require 3+ hours per day of interdisciplinary therapy in a comprehensive inpatient rehab setting.  Physiatrist is providing close team supervision and 24 hour management of active medical problems listed below.  Physiatrist and rehab team continue to assess barriers to discharge/monitor patient progress toward functional and medical goals   Care Tool:  Bathing     Body parts bathed by patient: Right arm,Left arm,Chest,Abdomen,Front perineal area,Buttocks,Right upper leg,Left upper leg,Face,Right lower leg,Left lower leg         Bathing assist Assist Level: Supervision/Verbal cueing     Upper Body Dressing/Undressing Upper body dressing   What is the patient wearing?: Pull over shirt,Bra    Upper body assist Assist Level: Set up assist    Lower Body Dressing/Undressing Lower body dressing      What is the patient wearing?: Underwear/pull up,Pants     Lower body assist Assist for lower body dressing: Supervision/Verbal cueing     Toileting Toileting    Toileting assist Assist for toileting: Independent with assistive device     Transfers Chair/bed transfer  Transfers assist  Chair/bed transfer activity did not occur: Safety/medical concerns  Chair/bed transfer assist level: Supervision/Verbal cueing     Locomotion Ambulation   Ambulation assist      Assist level: Contact Guard/Touching assist Assistive device: Walker-rolling Max distance: 244ft   Walk 10 feet activity   Assist     Assist level: Contact Guard/Touching assist Assistive device: Walker-rolling   Walk 50 feet activity   Assist Walk 50 feet with 2 turns activity did not occur: Safety/medical concerns  Assist level: Contact Guard/Touching assist Assistive device: Walker-rolling    Walk 150 feet activity   Assist Walk 150 feet activity did not occur: Safety/medical concerns  Assist level: Contact Guard/Touching assist Assistive device: Walker-rolling    Walk 10 feet on uneven surface  activity   Assist Walk 10  feet on uneven surfaces activity did not occur: Safety/medical concerns         Wheelchair     Assist Will patient use wheelchair at discharge?: No   Wheelchair activity did not occur: N/A         Wheelchair 50 feet with 2 turns activity    Assist    Wheelchair 50 feet with 2 turns activity did not  occur: N/A       Wheelchair 150 feet activity     Assist  Wheelchair 150 feet activity did not occur: N/A        Medical Problem List and Plan: 1.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 Antithrombotics: -DVT/anticoagulation:Coumadin (2.5-3.5)  INR therapeutic on 4/7 -antiplatelet therapy: Aspirin 81 mg daily 3. Pain Management:Tylenol as needed 4. Mood:Provide emotional support -antipsychotic agents: N/A 5. Neuropsych: This patientiscapable of making decisions on hisown behalf. 6. Skin/Wound Care:Routine skin checks 7. Fluids/Electrolytes/Nutrition:Routine in and outs  BMP within acceptable range on 3/30, labs ordered for tomorrow 8. UTI.   Urinalysis positive nitrite, Urine culture showing multiple species  Completed course of antibiotics on 4/1 9. Hypertension. Lopressor 25 mg twice daily.   Controlled on 4/7  Monitor with increased mobility 10. Hypothyroidism. TSH 0.371. Continue thyroid 90 mg daily 11. Hyperlipidemia. Lipitor 12. History of severe mitral regurgitation status post metal mitral valve replacement 2013. Continue chronic Coumadin. 13. Diastolic congestive heart failure. Monitor for any signs of fluid overload Filed Weights   01/20/21 0506 01/22/21 0500 01/23/21 0500  Weight: 66.2 kg 63.4 kg 63.7 kg   Relatively stable on 4/7 14. COPD. Check oxygen saturations every shift  No breathing issues on 4/7  To monitor with increased exertion 15. 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 Patient has never been on modifying therapy due to concerns for potential side effects but has not had any reported documented MS flare since diagnosis.  Patient states she urinates every 5 minutes and wears depends at home, which were not available to her, daughter-in-law to bring in  Pure wick nightly 16.  Paroxysmal Atrial Fibrillation- lopressor,  cont Warfarin  Patient states she has had a maze, ablation in the past and notes since that time she has only been in atrial fibrillation 1 time and that was in 2015.  ECG reviewed, showing PVCs 17.  Torticollis with cervical kyphosis  Patient wary of injections, and notes she has grown accustomed to it, is willing to work with therapies to improve range of motion   Discussed with therapies ROM 18.  Slow transit constipation  May take prune juice prn  Improving 19.  Prolonged QTC  Noted on ECG   LOS: 10 days A FACE TO FACE EVALUATION WAS PERFORMED  Azir Muzyka Lorie Phenix 01/23/2021, 12:28 PM

## 2021-01-23 NOTE — Progress Notes (Signed)
ANTICOAGULATION CONSULT NOTE  Pharmacy Consult for Warfarin Indication: mechanical MVR  Allergies  Allergen Reactions  . Erythromycin Nausea And Vomiting  . Valsartan Other (See Comments)    Pt reports caused her depression    Patient Measurements: Height: 5' 6.5" (168.9 cm) Weight: 63.7 kg (140 lb 6.9 oz) IBW/kg (Calculated) : 60.45  Vital Signs: Temp: 97.8 F (36.6 C) (04/07 0416) BP: 126/74 (04/07 0416) Pulse Rate: 82 (04/07 0416)  Labs: Recent Labs    01/22/21 0533 01/23/21 0955  LABPROT 36.7* 28.2*  INR 3.9* 2.8*    Estimated Creatinine Clearance: 61.6 mL/min (by C-G formula based on SCr of 0.79 mg/dL).   Assessment: Patient is a 70 yo F that presented to the ED with concerns for a stroke.  Transferred to inpatient rehab 3/28.  The patient is on warfarin PTA for a mechanical MVR with an INR goal of 2.5-3.5.   PTA Regimen: 7.5mg  M /Thur and 5mg  all other days  INR is back to therapeutic range after dose held yesterday.  INR 3 > 3.9 > 2.8.  Question if 4/6 INR was a lab error given quick response.  Will resume with home dose of 5mg  tonight.  No interactions noted and has been eating well. Last CBC 3/28 stable, no bleeding noted.  Goal of Therapy:  INR 2.5-3.5 Monitor platelets by anticoagulation protocol: Yes   Plan:  - Warfarin 5mg  PO x 1 tonight.   - Check INR tomorrow, may need to reconsider daily INR for now - Monitor patient for s/sx of bleeding   Thank you for involving pharmacy in this patient's care.  [employer], Pharm.D., BCPS Clinical Pharmacist  **Pharmacist phone directory can be found on [email or web address] listed under [place].  01/23/2021 1:14 PM

## 2021-01-23 NOTE — Progress Notes (Signed)
Occupational Therapy Session Note  Patient Details  Name: [name] [name] MRN: [id number] Date of Birth: 10-13-1951  Today's Date: 01/23/2021 OT Individual Time: 3154-0086 and 1345-1430 OT Individual Time Calculation (min): 65 min and 45 min   Short Term Goals: Week 1:  OT Short Term Goal 1 (Week 1): Pt will ambulate to bathroom with min A to S with RW. OT Short Term Goal 1 - Progress (Week 1): Met OT Short Term Goal 2 (Week 1): Pt will don pants over feet with S. OT Short Term Goal 2 - Progress (Week 1): Met OT Short Term Goal 3 (Week 1): Pt will be able to pull pants over hips with S in standing. OT Short Term Goal 3 - Progress (Week 1): Met OT Short Term Goal 4 (Week 1): Pt will demonstrate improved endurance to tolerate standing for 3 minutes. OT Short Term Goal 4 - Progress (Week 1): Met Week 2:  OT Short Term Goal 1 (Week 2): STGs = LTGs  Skilled Therapeutic Interventions/Progress Updates:    Visit 1:  Pain:  Pain Assessment Pain Scale: 0-10 Pain Score: 6  Pain Type: Acute pain Pain Location: Leg Pain Orientation: Right;Left Pain Descriptors / Indicators: Aching Pain Onset: On-going Pain Intervention(s): Medication (See eMAR)    Pt seen for BADL retraining of toileting, bathing, and dressing with a focus on functional mobility and balance.  Ambulated around room to gather supplies and clothing from drawers and closets with S using RW. See ADL documentation below. Overall, pt did extremely well and just needs to continue to work on dynamic balance. Pt resting in bed with all needs met.    Visit 2: Pain:  No c/o pain Pt in bed stating she still did not feel "great" after a vertigo episode before lunch when she moved from sit to supine. Suggested other strategies using gaze stabilization and moving to sidelying then supine slowly.  Pt not feeling energetic enough for walking, but she did agree to general strengthening and endurance with working on the arm bike.  Pt donned  footwear and then used RW to transfer to wc.  She was able to work at resistance 2 for 25 min with a few short rest breaks.   At end of session, pt opted to stay in wc as her next therapy session was in 15 min.    Therapy Documentation Precautions:  Precautions Precautions: Fall Precaution Comments: Neurogenic bladder - incontinent (premorbid) Restrictions Weight Bearing Restrictions: No    ADL: ADL Eating: Independent Grooming: Independent Where Assessed-Grooming: Standing at sink Upper Body Bathing: Setup Where Assessed-Upper Body Bathing: Shower Lower Body Bathing: Supervision/safety Where Assessed-Lower Body Bathing: Shower Upper Body Dressing: Setup Where Assessed-Upper Body Dressing: Edge of bed Lower Body Dressing: Supervision/safety Where Assessed-Lower Body Dressing: Edge of bed Toileting: Modified independent Where Assessed-Toileting: Glass blower/designer: Close supervision Toilet Transfer Method: Ambulating Tub/Shower Transfer: Close supervison Tub/Shower Transfer Method: Ambulating Tub/Shower Equipment: Transfer tub bench,Grab bars,Walk in shower   Therapy/Group: Individual Therapy  [place] 01/23/2021, 10:19 AM

## 2021-01-24 DIAGNOSIS — R42 Dizziness and giddiness: Secondary | ICD-10-CM

## 2021-01-24 DIAGNOSIS — R799 Abnormal finding of blood chemistry, unspecified: Secondary | ICD-10-CM

## 2021-01-24 LAB — BASIC METABOLIC PANEL
Anion gap: 6 (ref 5–15)
BUN: 29 mg/dL — ABNORMAL HIGH (ref 8–23)
CO2: 29 mmol/L (ref 22–32)
Calcium: 9.4 mg/dL (ref 8.9–10.3)
Chloride: 102 mmol/L (ref 98–111)
Creatinine, Ser: 0.88 mg/dL (ref 0.44–1.00)
GFR, Estimated: >60 mL/min (ref >60)
Glucose, Bld: 104 mg/dL — ABNORMAL HIGH (ref 70–99)
Potassium: 4.3 mmol/L (ref 3.5–5.1)
Sodium: 137 mmol/L (ref 135–145)

## 2021-01-24 LAB — CBC WITH DIFFERENTIAL/PLATELET
Abs Immature Granulocytes: 0.03 10*3/uL (ref 0.00–0.07)
Basophils Absolute: 0.1 10*3/uL (ref 0.0–0.1)
Basophils Relative: 1 %
Eosinophils Absolute: 0.2 10*3/uL (ref 0.0–0.5)
Eosinophils Relative: 3 %
HCT: 37.5 % (ref 36.0–46.0)
Hemoglobin: 13.3 g/dL (ref 12.0–15.0)
Immature Granulocytes: 0 %
Lymphocytes Relative: 22 %
Lymphs Abs: 1.5 10*3/uL (ref 0.7–4.0)
MCH: 32.8 pg (ref 26.0–34.0)
MCHC: 35.5 g/dL (ref 30.0–36.0)
MCV: 92.4 fL (ref 80.0–100.0)
Monocytes Absolute: 0.6 10*3/uL (ref 0.1–1.0)
Monocytes Relative: 9 %
Neutro Abs: 4.4 10*3/uL (ref 1.7–7.7)
Neutrophils Relative %: 65 %
Platelets: 278 10*3/uL (ref 150–400)
RBC: 4.06 MIL/uL (ref 3.87–5.11)
RDW: 13.3 % (ref 11.5–15.5)
WBC: 6.7 10*3/uL (ref 4.0–10.5)
nRBC: 0 % (ref 0.0–0.2)

## 2021-01-24 LAB — PROTIME-INR
INR: 2.6 — ABNORMAL HIGH (ref 0.8–1.2)
Prothrombin Time: 26.9 seconds — ABNORMAL HIGH (ref 11.4–15.2)

## 2021-01-24 MED ORDER — WARFARIN SODIUM 7.5 MG PO TABS: 7.5000 mg | ORAL_TABLET | ORAL | Status: DC

## 2021-01-24 MED ORDER — WARFARIN SODIUM 5 MG PO TABS
5.0000 mg | ORAL_TABLET | ORAL | Status: DC
  Administered 2021-01-24 – 2021-01-26 (×3): 5 mg via ORAL
  Filled 2021-01-24 (×3): qty 1

## 2021-01-24 MED ORDER — MECLIZINE HCL 25 MG PO TABS: 12.5000 mg | ORAL_TABLET | Freq: Two times a day (BID) | ORAL | Status: DC | PRN

## 2021-01-24 NOTE — Progress Notes (Signed)
Physical Therapy Session Note  Patient Details  Name: [name] [name] MRN: [id number] Date of Birth: 1950/12/07  Today's Date: 01/24/2021 PT Individual Time: 7517-0017 PT Individual Time Calculation (min): 40 min   Short Term Goals: Week 2:  PT Short Term Goal 1 (Week 2): Patient will ambulate >44ft with supervision consistently PT Short Term Goal 2 (Week 2): Patient will complete 14 steps with BHR and CGA consistently per home set up PT Short Term Goal 3 (Week 2): Patient will complete TUG in <2 mins with LRAD  Skilled Therapeutic Interventions/Progress Updates:    Patient received sitting up in bed, but asleep- easy to wake and agreeable to PT. She denies pain. Patient reports onset of BPPV-like symptoms yesterday in therapy when laying supine. Patient declined formal assessment of BPPV at this time. She was agreeable to ambulate to therapy gym with RW and supervision. Very slow gait speed maintained with poor ability to dual task noted. When patient would begin talking, she would stop ambulating. Patient completing 10 mins on NuStep using B UE/LE for improved reciprocal stepping/coordination as well as facilitating minor trunk rotation. Patient ambulating back to her room with RW and same deficits as noted above, however patient able to take longer steps B. Verbal cues to maintain these long steps needed. Patient returning to bed, bed alarm on, call light within reach.   Therapy Documentation Precautions:  Precautions Precautions: Fall Precaution Comments: Neurogenic bladder - incontinent (premorbid) Restrictions Weight Bearing Restrictions: No    Therapy/Group: Individual Therapy  Karoline Caldwell, PT, DPT, CBIS  01/24/2021, 7:49 AM

## 2021-01-24 NOTE — Consult Note (Signed)
Neuropsychological Consultation   Patient:   [name] [name]   DOB:   06/30/1951  MR Number:  [id number]  Location:  [facility] [address] [facility] [place] [id number] [place] [address] Dept: [place]: [phone]           Date of Service:   01/24/2021  Start Time:   9:30 AM End Time:   10:30 AM  Provider/Observer:  Ilean Skill, Psy.D.       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  Patient maintained on chronic Coumadin with other past medical history including PAF, hypertension, COPD.  Patient has a remote history of MS diagnosis with no recent acute exacerbation.  Patient does continue to have neurogenic bladder associated with her MS.  Diastolic congestive heart failure, IBS are also noted.  Patient presented on 01/11/2021 with acute onset right-sided weakness and aphasia.  Patient did not receive TPA.  MRI showed an area of diffusion on the left paramedian pons suspicious for acute infarction.  There was also noted stable burden of chronic demyelinating disease consistent with MS.  There was no evidence of active demyelination.  There were no cervical spine cord signal abnormalities or impingement noted.  Patient receiving CIR therapies due to residual impacts of the patient's right-sided weakness.  Patient's expressive language has improved significantly and the aphasia symptoms were all motor in nature.  Reason for Service:  Patient referred for neuropsychological consultation due to coping and adjustment due to significant loss in motor functioning following a brainstem (pons) stroke.  Below is the HPI for the current admission.  IRC: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 Per chart review lives alone independent with assistive device. Two-level home 4 steps to entry. She does not drive. Neighbors help provide transportation to appointments. Presented 01/11/2021 with acute onset of right side weakness and aphasia. Cranial CT scan showed no acute intracranial hemorrhage or evidence of acute infarction. 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 No hemodynamically significant stenosis. Patient did not receive TPA. MRI showed a wedge-shaped area of diffusion on the left paramedian pons suspicious for acute infarction. Stable burden of chronic demyelinating disease. No evidence of active demyelination. MRI cervical spine no cord signal abnormality or impingement. Admission chemistries unremarkable except BUN 27 glucose 143 INR 2.2 hemoglobin 14.3, urinalysis positive nitrite. Echocardiogram with ejection fraction of 65 to 70% no wall motion abnormalities. Patient presently remains on Coumadin as prior to admission with the addition of low-dose aspirin. Presently maintained on Rocephin for UTI with urine culture pending. Tolerating a regular consistency diet. Therapy evaluations completed due to patient's right side weakness and aphasia patient was admitted for a comprehensive rehab program  Current Status:  Patient was alert and oriented and sitting up in her bed as I entered the room.  Patient was quite talkative and showed no indication of expressive language deficits.  She was a very good historian regarding her past medical history and went into great detail regarding issues associated with her previous  MS events which she also felt that it could have been  related to past Mercury toxicity and has been followed by a holistic medicine regarding this issue in the past.  Patient also dealt with breast cancer and had a lumpectomy as well as all of her lymph nodes removed under her right arm.  Patient reports that she has recovered from much of this.  Patient also has torticollis with left head lean that the patient is well aware of.  Patient denied any significant depression or anxiety and reports that she is actually "enjoying her hospital stay" and feels like she is getting very good care and is making significant functional gains throughout.  Patient's affect and mood were quite bright.  She denied any significant anxiety but does have some history of significant anxiety associated with medical issues.  Cognition was quite good throughout.  Behavioral Observation: [name] [name]  presents as a 70 y.o.-year-old Right handed Caucasian Female who appeared her stated age. her dress was Appropriate and she was Well Groomed and her manners were Appropriate to the situation.  her participation was indicative of Appropriate and Attentive behaviors.  There were physical disabilities noted.  she displayed an appropriate level of cooperation and motivation.     Interactions:    Active Appropriate  Attention:   within normal limits and attention span and concentration were age appropriate  Memory:   within normal limits; recent and remote memory intact  Visuo-spatial:  not examined  Speech (Volume):  normal  Speech:   normal; normal  Thought Process:  Coherent and Relevant  Though Content:  WNL; not suicidal and not homicidal  Orientation:   person, place, time/date and situation  Judgment:   Good  Planning:   Good  Affect:    Appropriate  Mood:    Euthymic  Insight:   Good  Intelligence:   normal  Medical History:   Past Medical History:  Diagnosis Date  . Anxiety   . Arthritis    knees  . Breast cancer ([place]) 1999  . CHF (congestive  heart failure) ([place])    Related to severe mitral regurgitation, April, 2013  . 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 Assess by pulmonary team in the hospital April, 2013  . Ejection fraction    EF 60%, echo, April, 2013, with severe MR before mitral valve replacement  . Herpes   . Hypothyroidism   . IBS (irritable bowel syndrome)   . Mitral valve regurgitation    Mitral valve replacement April, 2013, Mitral valve prolapse  . Multiple sclerosis ([place])   . Neurogenic bladder   . Osteoporosis   . Ovarian cyst   . Paroxysmal atrial fibrillation (HCC)    Rapid atrial fibrillation in-hospital, Rapid cardioversion,  before mitral valve surgery  . Pulmonary hypertension ([place])    Echo, April, 2013, before mitral valve surgery  . S/P Maze operation for atrial fibrillation 01/26/2012   Complete biatrial lesion set using cryothermy via right mini thoracotomy  . S/P mitral valve replacement 01/26/2012   47mm Sorin [employer] Optiform mechanical prosthesis via right mini thoracotomy  . Warfarin anticoagulation    Mechanical mitral prosthesis, April, 20136         Patient Active Problem List   Diagnosis Date Noted  . Vertigo   . Elevated BUN   . Prolonged Q-T interval on ECG   . Chronic obstructive pulmonary disease ([place])   . Premature ventricular contractions   . Slow  transit constipation   . PAF (paroxysmal atrial fibrillation) ([place])   . Torticollis   . Essential hypertension   . Acute lower UTI   . Brainstem infarct, acute ([place]) 01/13/2021  . 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 CVA (cerebral vascular accident) ([place]) 01/11/2021  . Right sided weakness   . PVCs (premature ventricular contractions) 03/30/2020  . Osteoporosis 07/05/2019  . Nonischemic cardiomyopathy ([place]) 07/20/2018  . Chest pain 07/20/2018  . Scoliosis 11/20/2016  . Long term current use of anticoagulant 09/18/2016  . Compression fracture of lumbar spine,  non-traumatic, sequela 08/11/2016  . Multiple falls 08/11/2016  . At high risk for injury related to fall 08/11/2016  . Encounter for therapeutic drug monitoring 11/27/2013  . Hypothyroidism   . CHF (congestive heart failure) ([place])   . Paroxysmal atrial fibrillation (H[facility])   . Arthritis   . Neurogenic bladder   . Warfarin anticoagulation   . First degree heart block 01/29/2012  . 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 S/P Maze operation for atrial fibrillation 01/26/2012  . Anxiety 01/18/2012  . Multiple sclerosis ([place]) 01/19/2007     Psychiatric History:  Patient does have history of some anxiety but denies any acute exacerbation of her anxiety.  She reports that most of her anxiety is associated with numerous medical issues that she has had to deal with over the past decade or so including breast cancer, MS/neurological changes that are stable with no ongoing active demyelinization disease process.  Family Med/Psych History:  Family History  Problem Relation Age of Onset  . Heart disease Father        cardiac arrest   . CAD Father   . Hypertension Father   . CAD Mother        5 stents and numerous bypass surgery  . Hypertension Mother   . Hyperlipidemia Mother   . CAD Other   . Breast cancer Maternal Grandmother   . 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  Patient maintained on chronic Coumadin with other past medical history including PAF, hypertension, COPD.  Patient has a remote history of MS diagnosis with no recent acute exacerbation.  Patient does continue to have neurogenic bladder associated with her MS.  Diastolic congestive heart failure, IBS are also noted.  Patient presented on 01/11/2021 with acute onset right-sided weakness and aphasia.  Patient did not receive TPA.  MRI  showed an area of diffusion on the left paramedian pons suspicious for acute infarction.  There was also noted stable burden of chronic demyelinating disease consistent with MS.  There was no evidence of active demyelination.  There were no cervical spine cord signal abnormalities or impingement noted.  Patient receiving CIR therapies due to residual impacts of the patient's right-sided weakness.  Patient's expressive language has improved significantly and the aphasia symptoms were all motor in nature.  Patient was alert and oriented and sitting up in her bed as I entered the room.  Patient was quite talkative and showed no indication of expressive language deficits.  She was a very good historian regarding her past medical history and went into great detail regarding issues associated with her previous MS events which she also felt that it could have been related to past Mercury toxicity and has been followed by a holistic medicine regarding this issue  in the past.  Patient also dealt with breast cancer and had a lumpectomy as well as all of her lymph nodes removed under her right arm.  Patient reports that she has recovered from much of this.  Patient also has torticollis with left head lean that the patient is well aware of.  Patient denied any significant depression or anxiety and reports that she is actually "enjoying her hospital stay" and feels like she is getting very good care and is making significant functional gains throughout.  Patient's affect and mood were quite bright.  She denied any significant anxiety but does have some history of significant anxiety associated with medical issues.  Cognition was quite good throughout.  Disposition/Plan:  Today we worked on issues related to coping and adjustment with extended hospital stay.  Patient is looking forward to discharge and is making significant progress from her brainstem stroke.  Patient continues to have motor deficits in lower extremities  primarily.  Left-sided cerebrovascular impacting her pons.  Diagnosis:    Brainstem infarct, acute [facility]) - Plan: Ambulatory referral to Neurology         Electronically Signed   _______________________ Ilean Skill, Psy.D. Clinical Neuropsychologist

## 2021-01-24 NOTE — Progress Notes (Signed)
ANTICOAGULATION CONSULT NOTE  Pharmacy Consult for Warfarin Indication: mechanical MVR  Allergies  Allergen Reactions  . Erythromycin Nausea And Vomiting  . Valsartan Other (See Comments)    Pt reports caused her depression    Patient Measurements: Height: 5' 6.5" (168.9 cm) Weight: 67.2 kg (148 lb 2.4 oz) IBW/kg (Calculated) : 60.45  Vital Signs: Temp: 97.9 F (36.6 C) (04/08 0732) BP: 121/45 (04/08 0732) Pulse Rate: 70 (04/08 0732)  Labs: Recent Labs    01/22/21 0533 01/23/21 0955 01/24/21 0523  HGB  --   --  13.3  HCT  --   --  37.5  PLT  --   --  278  LABPROT 36.7* 28.2* 26.9*  INR 3.9* 2.8* 2.6*  CREATININE  --   --  0.88    Estimated Creatinine Clearance: 56 mL/min (by C-G formula based on SCr of 0.88 mg/dL).   Assessment: Patient is a 70 yo F that presented to the ED with concerns for a stroke.  Transferred to inpatient rehab 3/28.  The patient is on warfarin PTA for a mechanical MVR with an INR goal of 2.5-3.5.   PTA Regimen: 7.5mg  M /Thur and 5mg  all other days  INR therapeutic after dose held 4/6.  INR 3 > 3.9 > 2.8.  Question if 4/6 INR was a lab error given quick response.  Will resume with home dose tonight.  No interactions noted and has been eating well. Last CBC 3/28 stable, no bleeding noted.  Goal of Therapy:  INR 2.5-3.5 Monitor platelets by anticoagulation protocol: Yes   Plan:  - Warfarin 5mg  PO daily except 7.5mg  on Mon/Thurs - Check INR MWF - Monitor patient for s/sx of bleeding   Thank you for involving pharmacy in this patient's care.  [employer], Pharm.D., BCPS Clinical Pharmacist  **Pharmacist phone directory can be found on [email or web address] listed under [place].  01/24/2021 12:19 PM

## 2021-01-24 NOTE — Progress Notes (Signed)
Occupational Therapy Session Note  Patient Details  Name: [name] [name] MRN: [id number] Date of Birth: [birth year]  Today's Date: 01/24/2021 OT Individual Time: 2992-4268 OT Individual Time Calculation (min): 70 min    Short Term Goals: Week 2:  OT Short Term Goal 1 (Week 2): STGs = LTGs  Skilled Therapeutic Interventions/Progress Updates:    Pt received in bed ready for therapy. Pt requested to toilet first. She used her RW to ambulate in and out of the bathroom with S and used the bathroom with mod I.  Pt donned her socks and shoes.  Used RW to ambulate to ortho gym initially needing cues for upright posture, to stay within the frame of the walker and to take a large step. She then continued her steps well and sat on a mat to work on AROM exercises for stretches for her chest and shoulders. Using a hula hoop, pt held each side of the hoop and lifted it forward, overhead, pulling down and up and then slightly behind her head for chest expansion. Pt able to complete 10 reps moving slowly.   She declined laying down in supine for neck stretches and trapezius release due to fear of vertigo.  Instead sat behind pt to do pressure point release with pt giving good feedback on how much pressure she could handle.   Introduced her to the theracane for self massage/trigger point release.  Malena Edman her the cane to try over the weekend.   Pt walked back to her room with RW and close S. Her LLE was tired and not stepping through as easily. Pt opted to sit in the bed to rest.  Bed alarm set and all needs met.   Therapy Documentation Precautions:  Precautions Precautions: Fall Precaution Comments: Neurogenic bladder - incontinent (premorbid) Restrictions Weight Bearing Restrictions: No    Vital Signs: Therapy Vitals Temp: 97.9 F (36.6 C) Pulse Rate: 70 Resp: 18 BP: (!) 121/45 Patient Position (if appropriate): Lying Oxygen Therapy SpO2: 99 % O2 Device: Room Air Pain:   ADL: ADL Eating:  Independent Grooming: Independent Where Assessed-Grooming: Standing at sink Upper Body Bathing: Setup Where Assessed-Upper Body Bathing: Shower Lower Body Bathing: Supervision/safety Where Assessed-Lower Body Bathing: Shower Upper Body Dressing: Setup Where Assessed-Upper Body Dressing: Edge of bed Lower Body Dressing: Supervision/safety Where Assessed-Lower Body Dressing: Edge of bed Toileting: Modified independent Where Assessed-Toileting: Glass blower/designer: Close supervision Toilet Transfer Method: Ambulating Tub/Shower Transfer: Close supervison Tub/Shower Transfer Method: Ambulating Tub/Shower Equipment: Transfer tub bench,Grab bars,Walk in shower   Therapy/Group: Individual Therapy  [place] 01/24/2021, 8:50 AM

## 2021-01-24 NOTE — Progress Notes (Signed)
Physical Therapy Session Note  Patient Details  Name: [name] [name] MRN: [id number] Date of Birth: 05-01-1951  Today's Date: 01/24/2021 PT Individual Time: 0800-0856 PT Individual Time Calculation (min): 56 min   Short Term Goals: Week 2:  PT Short Term Goal 1 (Week 2): Patient will ambulate >1ft with supervision consistently PT Short Term Goal 2 (Week 2): Patient will complete 14 steps with BHR and CGA consistently per home set up PT Short Term Goal 3 (Week 2): Patient will complete TUG in <2 mins with LRAD  Skilled Therapeutic Interventions/Progress Updates:    Pt greeted supine in bed, awake and agreeable to therapy - no reports of pain. Reports a mild "dizzy" spell as she turned her head last night in the bed. Currently, reports no dizziness. Supine<>sit mod I with hospital bed features. Completed sit<>stand to RW with supervision and pt requesting to use bathroom. Ambulated ~30ft with supervision and RW to 3-1 BSC over toilet. Pt continent of Bladder. Donned new Depends brief and pt able to perform sit<>stand to RW with supervision and pull briefs up with supervision. Ambulated back to her bed with supervision and RW to complete dressing. Able to remove night gown indep. Donned bra, long sleeve t-shirt, sweat pants, and socks with setupA. Donned shoes with maxA for time management. Ambulated sinkside with supervision and RW where she brushed her teeth with supervision. W/c transport to main rehab gym for time management. Gait training, 2x142ft (seated rest), with supervision and RW - cues for monitoring L foot (tends to leave outside walker frame during turns) as well as improving bilateral step length. 30m gait speed 0.38 m/s, indicative of household ambulator. Pt also completed furniture transfer from low sitting sofa couch with supervision and RW. Returned to her room with [place] in w/c for time management. She remained seated in w/c as NT was changing bed linen. Needs within  reach.  Therapy Documentation Precautions:  Precautions Precautions: Fall Precaution Comments: Neurogenic bladder - incontinent (premorbid) Restrictions Weight Bearing Restrictions: No General:    Therapy/Group: Individual Therapy  Edrik Rundle P Vishaal Strollo PT 01/24/2021, 7:38 AM

## 2021-01-24 NOTE — Progress Notes (Signed)
[facility] PHYSICAL MEDICINE & REHABILITATION PROGRESS NOTE  Subjective/Complaints:  Patient seen sitting up in bed this morning.  She states she slept well overnight.  She notes she had vertigo yesterday with therapies, but states that she is "95% back to normal".  She has questions regarding neuropsych visit.  Discussed with neuropsych patient history/status.  ROS: Denies CP, SOB, N/V/D  Objective: Vital Signs: Blood pressure (!) 121/45, pulse 70, temperature 97.9 F (36.6 C), resp. rate 18, height 5' 6.5" (1.689 m), weight 67.2 kg, SpO2 99 %. No results found. Recent Labs    01/24/21 0523  WBC 6.7  HGB 13.3  HCT 37.5  PLT 278   Recent Labs    01/24/21 0523  NA 137  K 4.3  CL 102  CO2 29  GLUCOSE 104*  BUN 29*  CREATININE 0.88  CALCIUM 9.4    Intake/Output Summary (Last 24 hours) at 01/24/2021 1006 Last data filed at 01/24/2021 0828 Gross per 24 hour  Intake 600 ml  Output --  Net 600 ml        Physical Exam: BP (!) 121/45 (BP Location: Left Arm)   Pulse 70   Temp 97.9 F (36.6 C)   Resp 18   Ht 5' 6.5" (1.689 m)   Wt 67.2 kg   SpO2 99%   BMI 23.55 kg/m   Constitutional: No distress . Vital signs reviewed. HENT: Normocephalic.  Atraumatic. Eyes: EOMI. No discharge. Cardiovascular: No JVD.  RRR. Respiratory: Normal effort.  No stridor.  Bilateral clear to auscultation. GI: Non-distended.  BS +. Skin: Warm and dry.  Intact. Psych: Normal mood.  Normal behavior. Musc: No edema in extremities.  No tenderness in extremities. Cervical kyphosis, unchanged Neurologic: Alert Motor: 4-4+/5 throughout, right slightly weaker than left, stable + Torticollis, unchanged  Assessment/Plan: 1. Functional deficits which require 3+ hours per day of interdisciplinary therapy in a comprehensive inpatient rehab setting.  Physiatrist is providing close team supervision and 24 hour management of active medical problems listed below.  Physiatrist and rehab team continue  to assess barriers to discharge/monitor patient progress toward functional and medical goals   Care Tool:  Bathing    Body parts bathed by patient: Right arm,Left arm,Chest,Abdomen,Front perineal area,Buttocks,Right upper leg,Left upper leg,Face,Right lower leg,Left lower leg         Bathing assist Assist Level: Supervision/Verbal cueing     Upper Body Dressing/Undressing Upper body dressing   What is the patient wearing?: Pull over shirt,Bra    Upper body assist Assist Level: Set up assist    Lower Body Dressing/Undressing Lower body dressing      What is the patient wearing?: Underwear/pull up,Pants     Lower body assist Assist for lower body dressing: Supervision/Verbal cueing     Toileting Toileting    Toileting assist Assist for toileting: Independent with assistive device     Transfers Chair/bed transfer  Transfers assist  Chair/bed transfer activity did not occur: Safety/medical concerns  Chair/bed transfer assist level: Supervision/Verbal cueing     Locomotion Ambulation   Ambulation assist      Assist level: Contact Guard/Touching assist Assistive device: Walker-rolling Max distance: 259ft   Walk 10 feet activity   Assist     Assist level: Contact Guard/Touching assist Assistive device: Walker-rolling   Walk 50 feet activity   Assist Walk 50 feet with 2 turns activity did not occur: Safety/medical concerns  Assist level: Contact Guard/Touching assist Assistive device: Walker-rolling    Walk 150 feet activity  Assist Walk 150 feet activity did not occur: Safety/medical concerns  Assist level: Contact Guard/Touching assist Assistive device: Walker-rolling    Walk 10 feet on uneven surface  activity   Assist Walk 10 feet on uneven surfaces activity did not occur: Safety/medical concerns         Wheelchair     Assist Will patient use wheelchair at discharge?: No   Wheelchair activity did not occur: N/A          Wheelchair 50 feet with 2 turns activity    Assist    Wheelchair 50 feet with 2 turns activity did not occur: N/A       Wheelchair 150 feet activity     Assist  Wheelchair 150 feet activity did not occur: N/A        Medical Problem List and Plan: 1.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 Antithrombotics: -DVT/anticoagulation:Coumadin (2.5-3.5)  INR therapeutic on 4/8 -antiplatelet therapy: Aspirin 81 mg daily 3. Pain Management:Tylenol as needed 4. Mood:Provide emotional support -antipsychotic agents: N/A 5. Neuropsych: This patientiscapable of making decisions on hisown behalf. 6. Skin/Wound Care:Routine skin checks 7. Fluids/Electrolytes/Nutrition:Routine in and outs  BMP within acceptable range on 4/8 8. UTI.   Urinalysis positive nitrite, Urine culture showing multiple species  Completed course of antibiotics on 4/1 9. Hypertension. Lopressor 25 mg twice daily.   Controlled on 4/8  Monitor with increased mobility 10. Hypothyroidism. TSH 0.371. Continue thyroid 90 mg daily 11. Hyperlipidemia. Lipitor 12. History of severe mitral regurgitation status post metal mitral valve replacement 2013. Continue chronic Coumadin. 13. Diastolic congestive heart failure. Monitor for any signs of fluid overload Filed Weights   01/22/21 0500 01/23/21 0500 01/24/21 0543  Weight: 63.4 kg 63.7 kg 67.2 kg   ?  Reliability on 4/8 14. COPD. Check oxygen saturations every shift  No breathing issues on 4/8  To monitor with increased exertion 15. 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 Patient has never been on modifying therapy due to concerns for potential side effects but has not had any reported documented MS flare since diagnosis.  Patient states she urinates every 5 minutes and wears depends at home, which were not available to her, daughter-in-law to bring  in  Pure wick nightly 16.  Paroxysmal Atrial Fibrillation- lopressor, cont Warfarin  Patient states she has had a maze, ablation in the past and notes since that time she has only been in atrial fibrillation 1 time and that was in 2015.  ECG reviewed, showing PVCs 17.  Torticollis with cervical kyphosis  Patient wary of injections, and notes she has grown accustomed to it, is willing to work with therapies to improve range of motion   Discussed with therapies ROM 18.  Slow transit constipation  May take prune juice prn  Improving 19.  Prolonged QTC  Noted on ECG 20.  Elevated BUN  Encourage fluids 21.  Vertigo  BPPV +/- CVA  Will request vestibular eval  Meclizine as needed  LOS: 11 days A FACE TO FACE EVALUATION WAS PERFORMED  Chanele Douglas Lorie Phenix 01/24/2021, 10:06 AM

## 2021-01-25 NOTE — Progress Notes (Signed)
Occupational Therapy Session Note  Patient Details  Name: [name] [name] MRN: [id number] Date of Birth: 27-Sep-1951  Today's Date: 01/25/2021 OT Individual Time: 1550-1640 OT Individual Time Calculation (min): 50 min    Short Term Goals: Week 2:  OT Short Term Goal 1 (Week 2): STGs = LTGs  Skilled Therapeutic Interventions/Progress Updates:    Pt received in bed and agreeable to therapy. Sat to EOB and donned her socks and shoes.  Used RW with close S to ambulate to gym.   In gym sitting on mat worked on a variety of LE AROM exercises with int/ext rotation and add/abd of hips.  Worked on hamstring stretches with one leg straight, one bent. Looped a sheet around straight leg's foot and pt worked on "climbing the rope" on the sheet towards her foot. Could reach her shins. Repeated this 4x holding each stretch for 15 seconds. Repeated on other leg.    For core strength, reciprocal scoots forward to scoot to edge of mat. Ambulate halfway back to room and then got fatigued so used wc the rest of the way. Worked on isometric knee ext by holding legs up to support walker as therapist pushed chair back to room.  Pt transferred back to bed. In room with all needs met.   Therapy Documentation Precautions:  Precautions Precautions: Fall Precaution Comments: Neurogenic bladder - incontinent (premorbid) Restrictions Weight Bearing Restrictions: No    Vital Signs: Therapy Vitals Temp: 97.8 F (36.6 C) Pulse Rate: 72 Resp: 16 BP: (!) 119/57 Patient Position (if appropriate): Lying Oxygen Therapy SpO2: 99 % O2 Device: Room Air O2 Flow Rate (L/min): 97.8 L/min Pain:  no  C/o pain    Therapy/Group: Individual Therapy  [place] 01/25/2021, 4:41 PM

## 2021-01-25 NOTE — Progress Notes (Signed)
ANTICOAGULATION CONSULT NOTE  Pharmacy Consult for Warfarin Indication: mechanical MVR  Allergies  Allergen Reactions  . Erythromycin Nausea And Vomiting  . Valsartan Other (See Comments)    Pt reports caused her depression    Patient Measurements: Height: 5' 6.5" (168.9 cm) Weight: 66.5 kg (146 lb 9.7 oz) IBW/kg (Calculated) : 60.45  Vital Signs: Temp: 98.3 F (36.8 C) (04/09 0622) Temp Source: Oral (04/09 0622) BP: 136/91 (04/09 0622) Pulse Rate: 81 (04/09 0622)  Labs: Recent Labs    01/23/21 0955 01/24/21 0523  HGB  --  13.3  HCT  --  37.5  PLT  --  278  LABPROT 28.2* 26.9*  INR 2.8* 2.6*  CREATININE  --  0.88    Estimated Creatinine Clearance: 56 mL/min (by C-G formula based on SCr of 0.88 mg/dL).   Assessment: Patient is a 70 yo F that presented to the ED with concerns for a stroke.  Transferred to inpatient rehab 3/28.  The patient is on warfarin PTA for a mechanical MVR with an INR goal of 2.5-3.5.   PTA Regimen: 7.5mg  M /Thur and 5mg  all other days  INR therapeutic after dose held 4/6. Question if 4/6 INR was a lab error given quick response.  Home dose has been resumed.   No interactions noted and has been eating well. Last CBC 4/8 wnl/stable, no bleeding noted.  Goal of Therapy:  INR 2.5-3.5 Monitor platelets by anticoagulation protocol: Yes   Plan:  - Warfarin 5mg  PO daily except 7.5mg  on Mon/Thurs - Check INR MWF - Monitor patient for s/sx of bleeding   Thank you for involving pharmacy in this patient's care.  Rebbeca Paul, PharmD PGY1 Pharmacy Resident 01/25/2021 7:49 AM  Please check [email or web address] for unit-specific pharmacy phone numbers.

## 2021-01-25 NOTE — Progress Notes (Signed)
Physical Therapy Session Note  Patient Details  Name: [name] [name] MRN: [id number] Date of Birth: 1950-12-04  Today's Date: 01/25/2021 PT Individual Time: 0900-0958 PT Individual Time Calculation (min): 58 min   Short Term Goals: Week 2:  PT Short Term Goal 1 (Week 2): Patient will ambulate >41ft with supervision consistently PT Short Term Goal 2 (Week 2): Patient will complete 14 steps with BHR and CGA consistently per home set up PT Short Term Goal 3 (Week 2): Patient will complete TUG in <2 mins with LRAD  Skilled Therapeutic Interventions/Progress Updates:    Pt greeted supine in bed, awake and agreeable to therapy - no reports of pain and denies any recent dizzy spells. Supine<>sit mod I with bed features. Donned socks and shoes with totalA for time management. Sit<>stand with supervision to RW from lowered EOB height. Ambulated from her room to main rehab gym ,~129ft, with supervision and RW - cues needed for increasing stride/step length and maintaining proximity to RW. Seated rest needed prior to stair training. Navigated up/down x12 steps with close supervision and bilateral hand rails via self selected step-to pattern. No LOB or knee buckling present. Seated rest needed prior to performing [employer] balance training. Completed limits of stability, maze control, and catching game - provided CGA for steadying and cues needed for adequate weight shifting - increased difficulty with forward weight shift >backward. Completed the following standing there-ex in // bars with CGA and BUE support: -1x10 hip abduction, bilaterally -1x10 hamstring curls, bilaterally -1x10 heel raises, bilaterally -1x10 standing 'Y's  Ambulated back to her room (withuot seated rest break) with supervision and RW. Demo's decreased gait speed with similar deficits as above. Pt requesting to return to bed - removed shoes with totalA for time management and sit>supine mod I with hospital bed features. Pt remained supine  in bed with needs in reach, bed alarm on, made comfortable.  Therapy Documentation Precautions:  Precautions Precautions: Fall Precaution Comments: Neurogenic bladder - incontinent (premorbid) Restrictions Weight Bearing Restrictions: No General:    Therapy/Group: Individual Therapy  Delainee Tramel P Hatice Bubel PT 01/25/2021, 7:28 AM

## 2021-01-25 NOTE — Progress Notes (Signed)
[facility] PHYSICAL MEDICINE & REHABILITATION PROGRESS NOTE  Subjective/Complaints:    ROS: Denies CP, SOB, N/V/D  Objective: Vital Signs: Blood pressure (!) 136/91, pulse 81, temperature 98.3 F (36.8 C), temperature source Oral, resp. rate 19, height 5' 6.5" (1.689 m), weight 66.5 kg, SpO2 98 %. No results found. Recent Labs    01/24/21 0523  WBC 6.7  HGB 13.3  HCT 37.5  PLT 278   Recent Labs    01/24/21 0523  NA 137  K 4.3  CL 102  CO2 29  GLUCOSE 104*  BUN 29*  CREATININE 0.88  CALCIUM 9.4    Intake/Output Summary (Last 24 hours) at 01/25/2021 0738 Last data filed at 01/24/2021 1236 Gross per 24 hour  Intake 400 ml  Output --  Net 400 ml        Physical Exam: BP (!) 136/91 (BP Location: Left Arm)   Pulse 81   Temp 98.3 F (36.8 C) (Oral)   Resp 19   Ht 5' 6.5" (1.689 m)   Wt 66.5 kg   SpO2 98%   BMI 23.31 kg/m    General: No acute distress Mood and affect are appropriate Heart: Regular rate and rhythm no rubs murmurs or extra sounds Lungs: Clear to auscultation, breathing unlabored, no rales or wheezes Abdomen: Positive bowel sounds, soft nontender to palpation, nondistended Extremities: No clubbing, cyanosis, or edema Skin: No evidence of breakdown, no evidence of rash  Cervical kyphosis, unchanged Neurologic: Alert Motor: 4-4+/5 throughout, right slightly weaker than left, stable + Torticollis, unchanged  Assessment/Plan: 1. Functional deficits which require 3+ hours per day of interdisciplinary therapy in a comprehensive inpatient rehab setting.  Physiatrist is providing close team supervision and 24 hour management of active medical problems listed below.  Physiatrist and rehab team continue to assess barriers to discharge/monitor patient progress toward functional and medical goals   Care Tool:  Bathing    Body parts bathed by patient: Right arm,Left arm,Chest,Abdomen,Front perineal area,Buttocks,Right upper leg,Left upper  leg,Face,Right lower leg,Left lower leg         Bathing assist Assist Level: Supervision/Verbal cueing     Upper Body Dressing/Undressing Upper body dressing   What is the patient wearing?: Pull over shirt,Bra    Upper body assist Assist Level: Set up assist    Lower Body Dressing/Undressing Lower body dressing      What is the patient wearing?: Underwear/pull up,Pants     Lower body assist Assist for lower body dressing: Supervision/Verbal cueing     Toileting Toileting    Toileting assist Assist for toileting: Independent with assistive device     Transfers Chair/bed transfer  Transfers assist  Chair/bed transfer activity did not occur: Safety/medical concerns  Chair/bed transfer assist level: Supervision/Verbal cueing     Locomotion Ambulation   Ambulation assist      Assist level: Supervision/Verbal cueing Assistive device: Walker-rolling Max distance: 150   Walk 10 feet activity   Assist     Assist level: Supervision/Verbal cueing Assistive device: Walker-rolling   Walk 50 feet activity   Assist Walk 50 feet with 2 turns activity did not occur: Safety/medical concerns  Assist level: Supervision/Verbal cueing Assistive device: Walker-rolling    Walk 150 feet activity   Assist Walk 150 feet activity did not occur: Safety/medical concerns  Assist level: Supervision/Verbal cueing Assistive device: Walker-rolling    Walk 10 feet on uneven surface  activity   Assist Walk 10 feet on uneven surfaces activity did not occur: Safety/medical concerns  Wheelchair     Assist Will patient use wheelchair at discharge?: No   Wheelchair activity did not occur: N/A         Wheelchair 50 feet with 2 turns activity    Assist    Wheelchair 50 feet with 2 turns activity did not occur: N/A       Wheelchair 150 feet activity     Assist  Wheelchair 150 feet activity did not occur: N/A        Medical Problem  List and Plan: 1.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 Antithrombotics: -DVT/anticoagulation:Coumadin (2.5-3.5)  INR therapeutic on 4/8 -antiplatelet therapy: Aspirin 81 mg daily 3. Pain Management:Tylenol as needed 4. Mood:Provide emotional support -antipsychotic agents: N/A 5. Neuropsych: This patientiscapable of making decisions on hisown behalf. 6. Skin/Wound Care:Routine skin checks 7. Fluids/Electrolytes/Nutrition:Routine in and outs  BMP within acceptable range on 4/8 8. UTI.   Urinalysis positive nitrite, Urine culture showing multiple species  Completed course of antibiotics on 4/1 9. Hypertension. Lopressor 25 mg twice daily.  Vitals:   01/24/21 2040 01/25/21 0622  BP: (!) 108/46 (!) 136/91  Pulse: (!) 42 81  Resp: 15 19  Temp: 97.9 F (36.6 C) 98.3 F (36.8 C)  SpO2: 97% 98%   10. Hypothyroidism. TSH 0.371. Continue thyroid 90 mg daily 11. Hyperlipidemia. Lipitor 12. History of severe mitral regurgitation status post metal mitral valve replacement 2013. Continue chronic Coumadin. 13. Diastolic congestive heart failure. Monitor for any signs of fluid overload Filed Weights   01/23/21 0500 01/24/21 0543 01/25/21 0622  Weight: 63.7 kg 67.2 kg 66.5 kg   ?  Reliability on 4/8 14. COPD. Check oxygen saturations every shift  No breathing issues on 4/8  To monitor with increased exertion 15. 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 Patient has never been on modifying therapy due to concerns for potential side effects but has not had any reported documented MS flare since diagnosis.  Patient states she urinates every 5 minutes and wears depends at home, which were not available to her, daughter-in-law to bring in  Pure wick nightly 16.  Paroxysmal Atrial Fibrillation- lopressor, cont Warfarin  Patient states she has had a maze, ablation in the past and notes  since that time she has only been in atrial fibrillation 1 time and that was in 2015.  ECG reviewed, showing PVCs 17.  Torticollis with cervical kyphosis  Patient wary of injections, and notes she has grown accustomed to it, is willing to work with therapies to improve range of motion   Discussed with therapies ROM 18.  Slow transit constipation  May take prune juice prn  Improving 19.  Prolonged QTC  Noted on ECG 20.  Elevated BUN  Encourage fluids 21.  Vertigo  BPPV +/- CVA  Will request vestibular eval  Meclizine as needed  LOS: 12 days A FACE TO Palmer E Anthoni Geerts 01/25/2021, 7:38 AM

## 2021-01-26 NOTE — Progress Notes (Signed)
ANTICOAGULATION CONSULT NOTE  Pharmacy Consult for Warfarin Indication: mechanical MVR  Allergies  Allergen Reactions  . Erythromycin Nausea And Vomiting  . Valsartan Other (See Comments)    Pt reports caused her depression    Patient Measurements: Height: 5' 6.5" (168.9 cm) Weight: 65.8 kg (145 lb 1 oz) IBW/kg (Calculated) : 60.45  Vital Signs: Temp: 97.9 F (36.6 C) (04/10 0624) Temp Source: Oral (04/10 0624) BP: 119/51 (04/10 0624) Pulse Rate: 68 (04/10 0624)  Labs: Recent Labs    01/23/21 0955 01/24/21 0523  HGB  --  13.3  HCT  --  37.5  PLT  --  278  LABPROT 28.2* 26.9*  INR 2.8* 2.6*  CREATININE  --  0.88    Estimated Creatinine Clearance: 56 mL/min (by C-G formula based on SCr of 0.88 mg/dL).   Assessment: Patient is a 70 yo F that presented to the ED with concerns for a stroke.  Transferred to inpatient rehab 3/28.  The patient is on warfarin PTA for a mechanical MVR with an INR goal of 2.5-3.5.   PTA Regimen: 7.5mg  M /Thur and 5mg  all other days  Patient is on home warfarin regimen. No interactions noted and has been eating well. Last CBC 4/8 wnl/stable, no bleeding noted.  Goal of Therapy:  INR 2.5-3.5 Monitor platelets by anticoagulation protocol: Yes   Plan:  - Warfarin 5mg  PO daily except 7.5mg  on Mon/Thurs - Check INR MWF - Monitor patient for s/sx of bleeding   Thank you for involving pharmacy in this patient's care.  Rebbeca Paul, PharmD PGY1 Pharmacy Resident 01/26/2021 8:26 AM  Please check [email or web address] for unit-specific pharmacy phone numbers.

## 2021-01-27 LAB — PROTIME-INR
INR: 3 — ABNORMAL HIGH (ref 0.8–1.2)
Prothrombin Time: 30.2 seconds — ABNORMAL HIGH (ref 11.4–15.2)

## 2021-01-27 MED ORDER — WARFARIN SODIUM 5 MG PO TABS
5.0000 mg | ORAL_TABLET | Freq: Every day | ORAL | Status: AC
  Administered 2021-01-27 – 2021-01-28 (×2): 5 mg via ORAL
  Filled 2021-01-27 (×2): qty 1

## 2021-01-27 NOTE — Progress Notes (Signed)
Physical Therapy Session Note  Patient Details  Name: [name] [name] MRN: [id number] Date of Birth: Jun 23, 1951  Today's Date: 01/27/2021 PT Individual Time: 1100-1157 + 4098-1191 PT Individual Time Calculation (min): 57 min  + 28 min  Short Term Goals: Week 2:  PT Short Term Goal 1 (Week 2): Patient will ambulate >65ft with supervision consistently PT Short Term Goal 2 (Week 2): Patient will complete 14 steps with BHR and CGA consistently per home set up PT Short Term Goal 3 (Week 2): Patient will complete TUG in <2 mins with LRAD  Skilled Therapeutic Interventions/Progress Updates:     1st session: Pt greeted sitting EOB with OT present, handoff of care to PT as pt agreeable to therapy session. No reports of pain or dizziness. Sit<>stand with supervision to RW and she ambulated ~16ft with supervision and RW to main rehab gym - gait speed <0.3 m/s with decreased L>R step length - frequently stopping to converse indicating questionable dual-cog task.   Completed the following there-ex in // bars: -1x15 toe taps to cones with 4# ankle weights -1x15 lateral toe taps to cones with 4# ankle weights -1x15 hip abduction, bilaterally, with 4# ankle weights -1x15 hamstring curls, bilaterally, with 4# ankle weights -1x20 seated LAQ, bilaterally, with 4# ankle weights  Completed the following activities in // bars with CGA for steadying: -lateral stepping within agility ladder, L<>R, along length of // bar -lateral stepping over cones, L<>R, along length of // bar  Instructed on 2x5 goblet squats from elevated mat table with 1kg med ball - cues for forward weight shift with notable difficulty producing power in LE's to rise.  Standing ball toss with unweighted ball with supervision, working on ankle/hip strategies, righting reactions, dynamic standing balance, and coordination. Body within walker frame while completing this while standing in front of mat table for safety.Pt without  LOB.  Returned to her room in w/c for time management. Pt agreeable to remain seated in w/c for lunch with 2nd therapy session immediately. Needs within reach and pt made comfortable.  2nd session: Pt received sitting in w/c, agreeable to therapy without reports of pain. Daughter-in-law at bedside unpacking clean clothes. W/c transport outside with totalA for time management. Focus of session to perform gait outdoors on unlevel concrete. Gait x144ft with supervision and RW - cues needed for improving stride/step length and postural awareness to improve erect positioning. She also performed sit<>stand transfers from low sitting benches and chairs with supervision and RW. Returned upstairs to her room with totalA and she remained seated in w/c with needs in reach, family at bedside. Pt thankful for time outdoors.   Therapy Documentation Precautions:  Precautions Precautions: Fall Precaution Comments: Neurogenic bladder - incontinent (premorbid) Restrictions Weight Bearing Restrictions: No General:    Therapy/Group: Individual Therapy  Dalaina Tates P Avalin Briley PT 01/27/2021, 7:38 AM

## 2021-01-27 NOTE — Progress Notes (Signed)
Patient ID: [name] [name], female   DOB: February 10, 1951, 71 y.o.   MRN: [id number] Follow up with the patient regarding educational needs pending upcoming discharge. Patient noted she felt comfortable with the discharge and preparations including HH. She has arranged for family and friends to assist as needed and has a list of private pay care-providers should the need arise after discharge. Daughter in law at bedside and had no concerns at present. Margarito Liner

## 2021-01-27 NOTE — Progress Notes (Signed)
ANTICOAGULATION CONSULT NOTE  Pharmacy Consult for Warfarin Indication: mechanical MVR  Allergies  Allergen Reactions  . Erythromycin Nausea And Vomiting  . Valsartan Other (See Comments)    Pt reports caused her depression    Patient Measurements: Height: 5' 6.5" (168.9 cm) Weight: 65 kg (143 lb 4.8 oz) IBW/kg (Calculated) : 60.45  Vital Signs: Temp: 98 F (36.7 C) (04/11 0515) Temp Source: Oral (04/11 0515) BP: 139/62 (04/11 0515) Pulse Rate: 68 (04/11 0515)  Labs: Recent Labs    01/27/21 0630  LABPROT 30.2*  INR 3.0*    Estimated Creatinine Clearance: 56 mL/min (by C-G formula based on SCr of 0.88 mg/dL).   Assessment: Patient is a 70 yo F that presented to the ED with concerns for a stroke.  Transferred to inpatient rehab 3/28.  The patient is on warfarin PTA for a mechanical MVR with an INR goal of 2.5-3.5.   PTA Regimen: 7.5mg  M /Thur and 5mg  all other days  INR is therapeutic at 3 this morning. No interactions noted and has been eating well. Last CBC 4/8 wnl/stable, no bleeding noted.  Goal of Therapy:  INR 2.5-3.5 Monitor platelets by anticoagulation protocol: Yes   Plan:  - Warfarin 5mg  PO daily for 2 doses - Check INR MWF and adjust dose as needed - Monitor patient for s/sx of bleeding   Thank you for involving pharmacy in this patient's care.  Renold Genta, PharmD, BCPS Clinical Pharmacist Clinical phone for 01/27/2021 until 3p is [phone] 01/27/2021 1:25 PM  **Pharmacist phone directory can be found on [email or web address] listed under [place]**

## 2021-01-27 NOTE — Progress Notes (Signed)
[place] PHYSICAL MEDICINE & REHABILITATION PROGRESS NOTE  Subjective/Complaints:  Discusses that she had dizziness twice last week. Waiting for vestibular eval. Gregary Signs will reach to therapy team to see if she can get th vestibular eval. She has a friend with vertigo who has discussed this eval with her.   ROS: denies CP, SOB, N/V/D  Objective: Vital Signs: Blood pressure 139/62, pulse 68, temperature 98 F (36.7 C), temperature source Oral, resp. rate 20, height 5' 6.5" (1.689 m), weight 65 kg, SpO2 93 %. No results found. No results for input(s): WBC, HGB, HCT, PLT in the last 72 hours. No results for input(s): NA, K, CL, CO2, GLUCOSE, BUN, CREATININE, CALCIUM in the last 72 hours.  Intake/Output Summary (Last 24 hours) at 01/27/2021 1002 Last data filed at 01/26/2021 2030 Gross per 24 hour  Intake 480 ml  Output --  Net 480 ml        Physical Exam: BP 139/62 (BP Location: Left Arm)   Pulse 68   Temp 98 F (36.7 C) (Oral)   Resp 20   Ht 5' 6.5" (1.689 m)   Wt 65 kg   SpO2 93%   BMI 22.78 kg/m   Gen: no distress, normal appearing HEENT: oral mucosa pink and moist, NCAT Cardio: Reg rate Chest: normal effort, normal rate of breathing Abd: soft, non-distended Ext: no edema Psych: pleasant, normal affect Skin:  Cervical kyphosis, unchanged Neurologic: Alert Motor: 4-4+/5 throughout, right slightly weaker than left, stable + Torticollis, unchanged  Assessment/Plan: 1. Functional deficits which require 3+ hours per day of interdisciplinary therapy in a comprehensive inpatient rehab setting.  Physiatrist is providing close team supervision and 24 hour management of active medical problems listed below.  Physiatrist and rehab team continue to assess barriers to discharge/monitor patient progress toward functional and medical goals   Care Tool:  Bathing    Body parts bathed by patient: Right arm,Left arm,Chest,Abdomen,Front perineal area,Buttocks,Right upper  leg,Left upper leg,Face,Right lower leg,Left lower leg         Bathing assist Assist Level: Supervision/Verbal cueing     Upper Body Dressing/Undressing Upper body dressing   What is the patient wearing?: Pull over shirt,Bra    Upper body assist Assist Level: Set up assist    Lower Body Dressing/Undressing Lower body dressing      What is the patient wearing?: Underwear/pull up,Pants     Lower body assist Assist for lower body dressing: Supervision/Verbal cueing     Toileting Toileting    Toileting assist Assist for toileting: Independent with assistive device     Transfers Chair/bed transfer  Transfers assist  Chair/bed transfer activity did not occur: Safety/medical concerns  Chair/bed transfer assist level: Supervision/Verbal cueing     Locomotion Ambulation   Ambulation assist      Assist level: Supervision/Verbal cueing Assistive device: Walker-rolling Max distance: 150   Walk 10 feet activity   Assist     Assist level: Supervision/Verbal cueing Assistive device: Walker-rolling   Walk 50 feet activity   Assist Walk 50 feet with 2 turns activity did not occur: Safety/medical concerns  Assist level: Supervision/Verbal cueing Assistive device: Walker-rolling    Walk 150 feet activity   Assist Walk 150 feet activity did not occur: Safety/medical concerns  Assist level: Supervision/Verbal cueing Assistive device: Walker-rolling    Walk 10 feet on uneven surface  activity   Assist Walk 10 feet on uneven surfaces activity did not occur: Safety/medical concerns         Wheelchair  Assist Will patient use wheelchair at discharge?: No   Wheelchair activity did not occur: N/A         Wheelchair 50 feet with 2 turns activity    Assist    Wheelchair 50 feet with 2 turns activity did not occur: N/A       Wheelchair 150 feet activity     Assist  Wheelchair 150 feet activity did not occur: N/A         Medical Problem List and Plan: 1.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 Antithrombotics: -DVT/anticoagulation:Continue Coumadin (2.5-3.5)  INR therapeutic on 4/11 -antiplatelet therapy: Aspirin 81 mg daily 3. Pain Management:Tylenol as needed 4. Mood:Provide emotional support -antipsychotic agents: N/A 5. Neuropsych: This patientiscapable of making decisions on hisown behalf. 6. Skin/Wound Care:Routine skin checks 7. Fluids/Electrolytes/Nutrition:Routine in and outs  BMP within acceptable range on 4/8 8. UTI.   Urinalysis positive nitrite, Urine culture showing multiple species  Completed course of antibiotics on 4/1 9. Hypertension. ContinueLopressor 25 mg twice daily.  Vitals:   01/26/21 1949 01/27/21 0515  BP: (!) 125/51 139/62  Pulse: 66 68  Resp: 20 20  Temp: 97.8 F (36.6 C) 98 F (36.7 C)  SpO2: 98% 93%   10. Hypothyroidism. TSH 0.371. Continue thyroid 90 mg daily 11. Hyperlipidemia. Lipitor 12. History of severe mitral regurgitation status post metal mitral valve replacement 2013. Continue chronic Coumadin. 13. Diastolic congestive heart failure. Monitor for any signs of fluid overload Filed Weights   01/25/21 0622 01/26/21 0623 01/27/21 0515  Weight: 66.5 kg 65.8 kg 65 kg   ?  Reliability on 4/8 14. COPD. Check oxygen saturations every shift  No breathing issues on 4/11  To monitor with increased exertion 15. 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 Patient has never been on modifying therapy due to concerns for potential side effects but has not had any reported documented MS flare since diagnosis.  Patient states she urinates every 5 minutes and wears depends at home, which were not available to her, daughter-in-law to bring in  Pure wick nightly 16.  Paroxysmal Atrial Fibrillation- lopressor, continue Warfarin  Patient states she has had a  maze, ablation in the past and notes since that time she has only been in atrial fibrillation 1 time and that was in 2015.  ECG reviewed, showing PVCs 17.  Torticollis with cervical kyphosis  Patient wary of injections, and notes she has grown accustomed to it, is willing to work with therapies to improve range of motion   Discussed with therapies ROM 18.  Slow transit constipation  May take prune juice prn  Improving 19.  Prolonged QTC  Noted on ECG 20.  Elevated BUN  Encourage fluids 21.  Vertigo  BPPV +/- CVA  Will request vestibular eval  Meclizine as needed (not used): educated that this is ordered and she may ask for it.   LOS: 14 days A FACE TO FACE EVALUATION WAS PERFORMED  Robel Wuertz P Maddyx Vallie 01/27/2021, 10:02 AM

## 2021-01-27 NOTE — Progress Notes (Addendum)
Occupational Therapy Session Note  Patient Details  Name: [name] [name] MRN: [id number] Date of Birth: 11-Jan-1951  Today's Date: 01/27/2021 OT Individual Time: 1000-1100 and 1405-1445 OT Individual Time Calculation (min): 60 min and 40 min   Short Term Goals: Week 2:  OT Short Term Goal 1 (Week 2): STGs = LTGs  Skilled Therapeutic Interventions/Progress Updates:    Visit 1: Pain:no c/o pain   Pt seen for BADL retraining of toileting, bathing, and dressing with a focus on completing tasks at a more Ind level to prepare for discharge to home.  See ADL documentation below. Pt did well with all tasks.  Hand off to PT for next session.    Visit 2:  Pain:no c/o pain Pt seen this session to work on Upper neck mobility and balance.  Received in [place] talking with her dtr in law. Transported to ortho gym, in gym pt used RW to transfer to mat. On mat, applied deep tissue massage and trigger point pressure to uppertraps,  Gentle PROM to neck with lateral pressure to tilt neck to R with downward pressure on scapula. Pt stated she felt "looser". She used her RW to ambulate to stand at [facility] system and engaged in a few activities demonstrating safe balance while using her RW for light support. Ambulated back to her room with supervision and pt opted to get back in bed.  Bed alarm set and all needs met.   Therapy Documentation Precautions:  Precautions Precautions: Fall Precaution Comments: Neurogenic bladder - incontinent (premorbid) Restrictions Weight Bearing Restrictions: No        ADL: ADL Eating: Independent Grooming: Independent Where Assessed-Grooming: Standing at sink Upper Body Bathing: Setup Where Assessed-Upper Body Bathing: Shower Lower Body Bathing: Setup Where Assessed-Lower Body Bathing: Shower Upper Body Dressing: Independent Where Assessed-Upper Body Dressing: Edge of bed Lower Body Dressing: Setup Where Assessed-Lower Body Dressing: Edge of bed Toileting: Modified  independent Where Assessed-Toileting: Glass blower/designer: Distant supervision Armed forces technical officer Method: Ambulating Tub/Shower Transfer: Distant supervision Tub/Shower Transfer Method: Ambulating Tub/Shower Equipment: Transfer tub bench,Grab bars,Walk in shower   Therapy/Group: Individual Therapy  Keerstin Bjelland 01/27/2021, 12:26 PM

## 2021-01-28 NOTE — Progress Notes (Signed)
Physical Therapy Session Note  Patient Details  Name: [name] [name] MRN: [id number] Date of Birth: 04-06-1951  Today's Date: 01/28/2021 PT Individual Time: 1000-1115 PT Individual Time Calculation (min): 75 min   Short Term Goals: Week 2:  PT Short Term Goal 1 (Week 2): Patient will ambulate >28ft with supervision consistently PT Short Term Goal 2 (Week 2): Patient will complete 14 steps with BHR and CGA consistently per home set up PT Short Term Goal 3 (Week 2): Patient will complete TUG in <2 mins with LRAD  Skilled Therapeutic Interventions/Progress Updates:    Pt greeted seated in recliner, agreeable to therapy - no reports of pain. Sit<>stand with supervision to RW from recliner height. Gait training ~167ft from her room to main rehab gym with supervision and RW - continues to require cues for keeping body within walker frame and increasing her step/stride length.  NMR standing balance on blue airex foam pad with feet apart and with RW available as needed, completed dual=cog task overlay for playing game of checkers - pt with good ability to be forward thinking and not lose her balance. Frequent unilateral support noted that while reaching for moving checker pieces. Pt tolerated standing on foam pad for >15 minutes while playing the game without losing her balance. Therapist provided CGA throughout for safety. Extended seated rest break needed after completion 2/2 BLE muscle fatigue.    Gait training with tandem ambulation with RW on tape on level ground - needing minA guard due to weak hip stabilizers and increased lateral unsteadiness noted - pt also expressing fearfulness of falling while completing and needing cues for general sequencing and Rw management as she tends to veer L with it while doing this task.   Stair training, navigated up/down x12 steps with supervision and bilateral hand rails with self selected step-to pattern for both ascent and descent. No formal knee buckling or  LOB noted and pt with appropriate safety awareness while completing stairs.   Returned to her room with totalA in w/c for energy conservation. Stand<>pivot transfer with supervision and RW to EOB. Pt able to doff shoes/socks mod I while seated EOB and completed sit>supine mod I with hospital bed features. Pt remained semi-reclined in bed at end of session with needs in reach, bed alarm on. Pt appreciative of therapy interventions.   Therapy Documentation Precautions:  Precautions Precautions: Fall Precaution Comments: Neurogenic bladder - incontinent (premorbid) Restrictions Weight Bearing Restrictions: No General:    Therapy/Group: Individual Therapy  Madhavi Hamblen P Taaliyah Delpriore PT 01/28/2021, 7:33 AM

## 2021-01-28 NOTE — Progress Notes (Signed)
[place] PHYSICAL MEDICINE & REHABILITATION PROGRESS NOTE  Subjective/Complaints: . Has vestibular eval with Jeneen Rinks today Has some tightness in right upper back- has been using tennis balls, has heating pad, does not want trigger points  Vital signs stable  ROS: denies CP, SOB, N/V/D  Objective: Vital Signs: Blood pressure 125/80, pulse 81, temperature 98.1 F (36.7 C), resp. rate 15, height 5' 6.5" (1.689 m), weight 65.4 kg, SpO2 97 %. No results found. No results for input(s): WBC, HGB, HCT, PLT in the last 72 hours. No results for input(s): NA, K, CL, CO2, GLUCOSE, BUN, CREATININE, CALCIUM in the last 72 hours.  Intake/Output Summary (Last 24 hours) at 01/28/2021 1456 Last data filed at 01/27/2021 1842 Gross per 24 hour  Intake 240 ml  Output --  Net 240 ml        Physical Exam: BP 125/80   Pulse 81   Temp 98.1 F (36.7 C)   Resp 15   Ht 5' 6.5" (1.689 m)   Wt 65.4 kg   SpO2 97%   BMI 22.92 kg/m   Gen: no distress, normal appearing HEENT: oral mucosa pink and moist, NCAT Cardio: Reg rate Chest: normal effort, normal rate of breathing Abd: soft, non-distended Ext: no edema Psych: pleasant, normal affect Skin:  Cervical kyphosis, unchanged Neurologic: Alert Motor: 4-4+/5 throughout, right slightly weaker than left, stable + Torticollis, unchanged  Assessment/Plan: 1. Functional deficits which require 3+ hours per day of interdisciplinary therapy in a comprehensive inpatient rehab setting.  Physiatrist is providing close team supervision and 24 hour management of active medical problems listed below.  Physiatrist and rehab team continue to assess barriers to discharge/monitor patient progress toward functional and medical goals   Care Tool:  Bathing    Body parts bathed by patient: Right arm,Left arm,Chest,Abdomen,Front perineal area,Buttocks,Right upper leg,Left upper leg,Face,Right lower leg,Left lower leg         Bathing assist Assist Level: Set  up assist     Upper Body Dressing/Undressing Upper body dressing   What is the patient wearing?: Pull over shirt,Bra    Upper body assist Assist Level: Independent    Lower Body Dressing/Undressing Lower body dressing      What is the patient wearing?: Underwear/pull up,Pants     Lower body assist Assist for lower body dressing: Independent with assitive device     Toileting Toileting    Toileting assist Assist for toileting: Independent with assistive device     Transfers Chair/bed transfer  Transfers assist  Chair/bed transfer activity did not occur: Safety/medical concerns  Chair/bed transfer assist level: Supervision/Verbal cueing     Locomotion Ambulation   Ambulation assist      Assist level: Supervision/Verbal cueing Assistive device: Walker-rolling Max distance: 150   Walk 10 feet activity   Assist     Assist level: Supervision/Verbal cueing Assistive device: Walker-rolling   Walk 50 feet activity   Assist Walk 50 feet with 2 turns activity did not occur: Safety/medical concerns  Assist level: Supervision/Verbal cueing Assistive device: Walker-rolling    Walk 150 feet activity   Assist Walk 150 feet activity did not occur: Safety/medical concerns  Assist level: Supervision/Verbal cueing Assistive device: Walker-rolling    Walk 10 feet on uneven surface  activity   Assist Walk 10 feet on uneven surfaces activity did not occur: Safety/medical concerns         Wheelchair     Assist Will patient use wheelchair at discharge?: No   Wheelchair activity did not  occur: N/A         Wheelchair 50 feet with 2 turns activity    Assist    Wheelchair 50 feet with 2 turns activity did not occur: N/A       Wheelchair 150 feet activity     Assist  Wheelchair 150 feet activity did not occur: N/A        Medical Problem List and Plan: 1.Right-sided hemiparesis secondary to left pontine left cerebellum  CVA  Continue CIR 2. Antithrombotics: -DVT/anticoagulation:Continue Coumadin (2.5-3.5)  INR therapeutic on 4/11 -antiplatelet therapy: Aspirin 81 mg daily 3. Pain Management:Tylenol as needed 4. Mood:Provide emotional support -antipsychotic agents: N/A 5. Neuropsych: This patientiscapable of making decisions on hisown behalf. 6. Skin/Wound Care:Routine skin checks 7. Fluids/Electrolytes/Nutrition:Routine in and outs  BMP within acceptable range on 4/8 8. UTI.   Urinalysis positive nitrite, Urine culture showing multiple species  Completed course of antibiotics on 4/1 9. Hypertension. ContinueLopressor 25 mg twice daily.  Vitals:   01/28/21 0438 01/28/21 1129  BP: (!) 119/39 125/80  Pulse: 64 81  Resp: 15   Temp: 98.1 F (36.7 C)   SpO2: 97%    10. Hypothyroidism. TSH 0.371. Continue thyroid 90 mg daily 11. Hyperlipidemia. Lipitor 12. History of severe mitral regurgitation status post metal mitral valve replacement 2013. Continue chronic Coumadin. 13. Diastolic congestive heart failure. Monitor for any signs of fluid overload Filed Weights   01/26/21 0623 01/27/21 0515 01/28/21 0438  Weight: 65.8 kg 65 kg 65.4 kg   ?  Reliability on 4/8 14. COPD. Check oxygen saturations every shift  No breathing issues on 4/11  To monitor with increased exertion 15. 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 Patient has never been on modifying therapy due to concerns for potential side effects but has not had any reported documented MS flare since diagnosis.  Patient states she urinates every 5 minutes and wears depends at home, which were not available to her, daughter-in-law to bring in  Pure wick nightly 16.  Paroxysmal Atrial Fibrillation- lopressor, continue Warfarin  Patient states she has had a maze, ablation in the past and notes since that time she has only been in atrial fibrillation 1 time and that was in 2015.  ECG  reviewed, showing PVCs 17.  Torticollis with cervical kyphosis  Patient wary of injections, and notes she has grown accustomed to it, is willing to work with therapies to improve range of motion   Discussed with therapies ROM 18.  Slow transit constipation  May take prune juice prn  Improving 19.  Prolonged QTC  Noted on ECG 20.  Elevated BUN  Encourage fluids 21.  Vertigo  BPPV +/- CVA  Vestibular eval by Jeneen Rinks today  Meclizine as needed (not used): educated that this is ordered and she may ask for it.   LOS: 15 days A FACE TO FACE EVALUATION WAS PERFORMED  Clide Deutscher Teresina Bugaj 01/28/2021, 2:56 PM

## 2021-01-28 NOTE — Progress Notes (Signed)
Occupational Therapy Session Note  Patient Details  Name: [name] [name] MRN: [id number] Date of Birth: [birth year]  Today's Date: 01/28/2021 OT Individual Time: 1300-1408 OT Individual Time Calculation (min): 68 min    Short Term Goals: Week 2:  OT Short Term Goal 1 (Week 2): STGs = LTGs  Skilled Therapeutic Interventions/Progress Updates:    Pt in bed to start session, just hitting the call light to transfer to the bathroom.  She completed transfer to the EOB with supervision and then ambulated to the 3:1 over the toilet with supervision using the RW for support.  She completed all toilet hygiene and changed her brief with supervision sit to stand.  Next, per her request she ambulated to the sink for completion of washing her hands and for completion of oral hygiene.  Returned to the EOB at conclusion of task.  Pt then seen for vestibular evaluation.  She reports history of some dizziness prior to CVA intermittently with positional changes lasting for a few mins of time over the past few years.  With this CVA, she reports initially in the ER having moderate dizziness and when looking at other people standing up, the appeared to be laying sideways.  Currently, she has had two episodes over the last couple weeks where she reported dizziness with laying down and turning her head, mostly to the right.  Pt sits with moderate torticollis to the left with cervical flexion.  With head turn and hold in sitting to the right and left she reported no dizziness to either side.  Completed Marye Round for testing of the anterior and posterior canals.  Used Trendelenburg position of the bed to assist secondary to having history of vertebral artery insufficiency.  Both right and left tests were negative, but speed of test had to be modified secondary to pt's neck limitations.  Horizontal canal testing was completed and negative to both sides as well.  When sitting up on the side of the bed and transitioning quickly  to right side supine, she reported dizziness at 1/10 lasting for less than 2-3 seconds.  With return to sitting, she reported dizziness at 3/10 for a brief period of a few seconds as well, but no nystagmus noted at any time.  Visual tracking was WFLs as well as saccades.  She exhibited increased difficulty with gaze stabilization, especially with head turns to the right when attempting to maintain fixed gaze.  She did better with superior to inferior head movements.  VOR cancellation test was [facility] for slower speed secondary to neck ROM limitations.  Issued gaze stabilization exercises for pt to complete daily at home with handout as reference.  Instructed pt that she should continue to monitor any episodes of dizziness that she has and take notes on position she was in, intensity on a scale of 1-10, as well as duration.  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  Since this dizziness has been ongoing at times, even before the stroke, would not rule out some of the symptoms being caused by vertebral artery in sufficiency as pt has history of this.  Finished session with transfer to supine and pt sitting up in bed with the call button and phone in reach.    Therapy Documentation Precautions:  Precautions Precautions: Fall Precaution Comments: Neurogenic bladder - incontinent (premorbid) Restrictions Weight Bearing Restrictions: No  Pain: Pain Assessment Pain Scale: Faces Pain Score: 0-No pain ADL: See Care Tool Section for some details of  mobility and selfcare  Therapy/Group: Individual Therapy  Isobel Eisenhuth OTR/L 01/28/2021, 3:46 PM

## 2021-01-28 NOTE — Progress Notes (Incomplete)
Occupational Therapy Session Note  Patient Details  Name: [name] [name] MRN: [id number] Date of Birth: 1951/09/28  Today's Date: 01/28/2021 OT Individual Time: 0830-0900 OT Individual Time Calculation (min): 30 min    Short Term Goals: Week 2:  OT Short Term Goal 1 (Week 2): STGs = LTGs  Skilled Therapeutic Interventions/Progress Updates:      Therapy Documentation Precautions:  Precautions Precautions: Fall Precaution Comments: Neurogenic bladder - incontinent (premorbid) Restrictions Weight Bearing Restrictions: No     Pain: Pain Assessment Pain Score: 0-No pain    Therapy/Group: Individual Therapy  Buchanan 01/28/2021, 10:25 AM

## 2021-01-28 NOTE — Discharge Summary (Signed)
Physician Discharge Summary  Patient ID: [name] [name] MRN: [id number] DOB/AGE: Mar 09, 1951 70 y.o.  Admit date: 01/13/2021 Discharge date: 01/30/2021  Discharge Diagnoses:  Principal Problem:   Brainstem infarct, acute [facility]) Active Problems:   Left pontine cerebrovascular accident ([place])   Torticollis   Essential hypertension   Acute lower UTI   PAF (paroxysmal atrial fibrillation) (HCC)   Slow transit constipation   Prolonged Q-T interval on ECG   Chronic obstructive pulmonary disease (HCC)   Premature ventricular contractions   Vertigo   Elevated BUN Hypothyroidism Diastolic congestive heart failure Hyperlipidemia COPD Self-reported multiple sclerosis  Discharged Condition: Stable  Significant Diagnostic Studies: CT ANGIO HEAD W OR WO CONTRAST  Result Date: 01/11/2021 CLINICAL DATA:  Code stroke EXAM: CT HEAD WITHOUT CONTRAST CT ANGIOGRAPHY OF THE HEAD AND NECK TECHNIQUE: Contiguous axial images were obtained from the base of the skull through the vertex without intravenous contrast. Multidetector CT imaging of the head and neck was performed using the standard protocol during bolus administration of intravenous contrast. Multiplanar CT image reconstructions and MIPs were obtained to evaluate the vascular anatomy. Carotid stenosis measurements (when applicable) are obtained utilizing NASCET criteria, using the distal internal carotid diameter as the denominator. CONTRAST:  34mL OMNIPAQUE IOHEXOL 350 MG/ML SOLN COMPARISON:  None. FINDINGS: CT HEAD Brain: No acute intracranial hemorrhage, mass effect, or edema. Gray-white differentiation is preserved. Patchy hypoattenuation in the supratentorial white matter is nonspecific but probably reflects chronic microvascular ischemic changes. Prominence of the ventricles and sulci reflects generalized parenchymal volume loss. No extra-axial collection. Vascular: No hyperdense vessel. Skull: Unremarkable. Sinuses/Orbits: No acute abnormality.  Other: Mastoid air cells are clear. ASPECTS ([place] Stroke Program Early CT Score) - Ganglionic level infarction (caudate, lentiform nuclei, internal capsule, insula, M1-M3 cortex): 7 - Supraganglionic infarction (M4-M6 cortex): 3 Total score (0-10 with 10 being normal): 10 CTA NECK Aortic arch: Great vessel origins are patent. There is mixed plaque along the innominate extending into the subclavian origin with less than 50% stenosis. Right carotid system: Patent. Atherosclerotic wall thickening with eccentric plaque along the proximal common carotid causing less than 50% stenosis. Minimal calcified plaque at the bifurcation. No stenosis at the ICA origin. Left carotid system: Patent. Mild calcified plaque at the ICA origin without stenosis. Vertebral arteries:Left vertebral artery is patent. Right V1 vertebral artery is not opacified. There is faint enhancement of the right V2 and V3 segments. Skeleton: Degenerative changes of the cervical spine. Other neck: Scarring at the right lung apex with traction bronchiectasis. CTA HEAD Anterior circulation: Intracranial internal carotid arteries are patent with minimal calcified plaque. Anterior and middle cerebral arteries are patent. Posterior circulation: Intracranial vertebral arteries are patent. Improved opacification of the right vertebral artery beyond the PICA origin probably related to retrograde flow. Basilar artery is patent. Posterior cerebral arteries are patent. Bilateral posterior communicating arteries are present, right larger than left. Venous sinuses: Not well evaluated. IMPRESSION: There is no acute intracranial hemorrhage or evidence of acute infarction. ASPECT score is 10. 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 Patent intracranially with greater flow beyond PICA origin likely from retrograde flow. Otherwise, no hemodynamically significant  stenosis. Initial results were communicated to Dr. Cheral Marker at 2:39 p.m. on 01/11/2021 by text page via the [facility] messaging system. Electronically Signed   By: Macy Mis M.D.   On: 01/11/2021 14:50   CT ANGIO NECK W OR WO CONTRAST  Result Date: 01/11/2021 CLINICAL DATA:  Code stroke EXAM: CT HEAD WITHOUT CONTRAST CT ANGIOGRAPHY OF THE HEAD AND NECK TECHNIQUE: Contiguous axial images were obtained from the base of the skull through the vertex without intravenous contrast. Multidetector CT imaging of the head and neck was performed using the standard protocol during bolus administration of intravenous contrast. Multiplanar CT image reconstructions and MIPs were obtained to evaluate the vascular anatomy. Carotid stenosis measurements (when applicable) are obtained utilizing NASCET criteria, using the distal internal carotid diameter as the denominator. CONTRAST:  7mL OMNIPAQUE IOHEXOL 350 MG/ML SOLN COMPARISON:  None. FINDINGS: CT HEAD Brain: No acute intracranial hemorrhage, mass effect, or edema. Gray-white differentiation is preserved. Patchy hypoattenuation in the supratentorial white matter is nonspecific but probably reflects chronic microvascular ischemic changes. Prominence of the ventricles and sulci reflects generalized parenchymal volume loss. No extra-axial collection. Vascular: No hyperdense vessel. Skull: Unremarkable. Sinuses/Orbits: No acute abnormality. Other: Mastoid air cells are clear. ASPECTS ([place] Stroke Program Early CT Score) - Ganglionic level infarction (caudate, lentiform nuclei, internal capsule, insula, M1-M3 cortex): 7 - Supraganglionic infarction (M4-M6 cortex): 3 Total score (0-10 with 10 being normal): 10 CTA NECK Aortic arch: Great vessel origins are patent. There is mixed plaque along the innominate extending into the subclavian origin with less than 50% stenosis. Right carotid system: Patent. Atherosclerotic wall thickening with eccentric plaque along the proximal common  carotid causing less than 50% stenosis. Minimal calcified plaque at the bifurcation. No stenosis at the ICA origin. Left carotid system: Patent. Mild calcified plaque at the ICA origin without stenosis. Vertebral arteries:Left vertebral artery is patent. Right V1 vertebral artery is not opacified. There is faint enhancement of the right V2 and V3 segments. Skeleton: Degenerative changes of the cervical spine. Other neck: Scarring at the right lung apex with traction bronchiectasis. CTA HEAD Anterior circulation: Intracranial internal carotid arteries are patent with minimal calcified plaque. Anterior and middle cerebral arteries are patent. Posterior circulation: Intracranial vertebral arteries are patent. Improved opacification of the right vertebral artery beyond the PICA origin probably related to retrograde flow. Basilar artery is patent. Posterior cerebral arteries are patent. Bilateral posterior communicating arteries are present, right larger than left. Venous sinuses: Not well evaluated. IMPRESSION: There is no acute intracranial hemorrhage or evidence of acute infarction. ASPECT score is 10. 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 Patent intracranially with greater flow beyond PICA origin likely from retrograde flow. Otherwise, no hemodynamically significant stenosis. Initial results were communicated to Dr. Cheral Marker at 2:39 p.m. on 01/11/2021 by text page via the [facility] messaging system. Electronically Signed   By: Macy Mis M.D.   On: 01/11/2021 14:50   MR BRAIN W WO CONTRAST  Addendum Date: 01/11/2021   ADDENDUM REPORT: 01/11/2021 18:58 ADDENDUM: There is a wedge-shaped area of diffusion hyperintensity at the left paramedian pons without corresponding T2 hyperintensity suspicious for acute perforator infarct. Updated findings were discussed with Dr. Cheral Marker on 01/11/2021 at 6:50 p.m.  Punctate focus of diffusion hyperintensity in the posterior left cerebellum is without correlate on coronal DWI. Electronically Signed   By: Macy Mis M.D.   On: 01/11/2021 18:58   Result Date: 01/11/2021 CLINICAL DATA:  Code stroke follow-up, history of multiple sclerosis EXAM: MRI HEAD WITHOUT AND WITH CONTRAST TECHNIQUE: Multiplanar, multiecho pulse sequences of the brain and surrounding structures were obtained without and with intravenous contrast. CONTRAST:  72mL GADAVIST GADOBUTROL 1 MMOL/ML IV SOLN COMPARISON:  12/14/2019 FINDINGS: Brain: There is no acute infarction  or intracranial hemorrhage. There is no intracranial mass, mass effect, or edema. There is no hydrocephalus or extra-axial fluid collection. Foci of T2 hyperintensity are present in the periventricular greater than subcortical white matter consistent with history of multiple sclerosis. A few small foci are also present within the cerebellum. No change since the prior study. Prominence of the ventricles and sulci reflects stable parenchymal volume loss. No abnormal enhancement. Vascular: Major vessel flow voids at the skull base are preserved. Skull and upper cervical spine: Normal marrow signal is preserved. Sinuses/Orbits: Minor mucosal thickening.  Orbits are unremarkable. Other: Sella is unremarkable.  Mastoid air cells are clear. IMPRESSION: No acute infarction. Stable burden of chronic demyelinating disease. No evidence of active demyelination. Stable parenchymal volume loss. Electronically Signed: By: Macy Mis M.D. On: 01/11/2021 17:16   MR CERVICAL SPINE WO CONTRAST  Result Date: 01/12/2021 CLINICAL DATA:  Acute or progressive myelopathy EXAM: MRI CERVICAL SPINE WITHOUT CONTRAST TECHNIQUE: Multiplanar, multisequence MR imaging of the cervical spine was performed. No intravenous contrast was administered. COMPARISON:  02/18/2017 FINDINGS: Alignment: Exaggerated cervical lordosis. Vertebrae: No fracture, evidence of  discitis, or bone lesion. Cord: Normal signal. Posterior Fossa, vertebral arteries, paraspinal tissues: Intracranial findings described on brain MRI from yesterday. No perispinal mass or inflammation noted Disc levels: C2-3: Mild facet spurring. C3-4: Asymmetric left facet spurring with ankylosis C4-5: Ligamentum flavum thickening. Degenerative facet spurring. No neural compression C5-6: Disc narrowing and bulging. Small central disc protrusion. Mild facet spurring with ligamentum flavum thickening. Degenerative interspinous ganglion formation. C6-7: Narrowing of the disc with mild bulging. Borderline facet spurring. C7-T1:Degenerative facet spurring on the left more than right. No herniation or impingement. IMPRESSION: 1. No cord signal abnormality or impingement. 2. Noncompressive degenerative changes are described above. Electronically Signed   By: Monte Fantasia M.D.   On: 01/12/2021 07:30   CT HEAD CODE STROKE WO CONTRAST  Result Date: 01/11/2021 CLINICAL DATA:  Code stroke EXAM: CT HEAD WITHOUT CONTRAST CT ANGIOGRAPHY OF THE HEAD AND NECK TECHNIQUE: Contiguous axial images were obtained from the base of the skull through the vertex without intravenous contrast. Multidetector CT imaging of the head and neck was performed using the standard protocol during bolus administration of intravenous contrast. Multiplanar CT image reconstructions and MIPs were obtained to evaluate the vascular anatomy. Carotid stenosis measurements (when applicable) are obtained utilizing NASCET criteria, using the distal internal carotid diameter as the denominator. CONTRAST:  34mL OMNIPAQUE IOHEXOL 350 MG/ML SOLN COMPARISON:  None. FINDINGS: CT HEAD Brain: No acute intracranial hemorrhage, mass effect, or edema. Gray-white differentiation is preserved. Patchy hypoattenuation in the supratentorial white matter is nonspecific but probably reflects chronic microvascular ischemic changes. Prominence of the ventricles and sulci  reflects generalized parenchymal volume loss. No extra-axial collection. Vascular: No hyperdense vessel. Skull: Unremarkable. Sinuses/Orbits: No acute abnormality. Other: Mastoid air cells are clear. ASPECTS ([place] Stroke Program Early CT Score) - Ganglionic level infarction (caudate, lentiform nuclei, internal capsule, insula, M1-M3 cortex): 7 - Supraganglionic infarction (M4-M6 cortex): 3 Total score (0-10 with 10 being normal): 10 CTA NECK Aortic arch: Great vessel origins are patent. There is mixed plaque along the innominate extending into the subclavian origin with less than 50% stenosis. Right carotid system: Patent. Atherosclerotic wall thickening with eccentric plaque along the proximal common carotid causing less than 50% stenosis. Minimal calcified plaque at the bifurcation. No stenosis at the ICA origin. Left carotid system: Patent. Mild calcified plaque at the ICA origin without stenosis. Vertebral arteries:Left vertebral artery is patent. Right  V1 vertebral artery is not opacified. There is faint enhancement of the right V2 and V3 segments. Skeleton: Degenerative changes of the cervical spine. Other neck: Scarring at the right lung apex with traction bronchiectasis. CTA HEAD Anterior circulation: Intracranial internal carotid arteries are patent with minimal calcified plaque. Anterior and middle cerebral arteries are patent. Posterior circulation: Intracranial vertebral arteries are patent. Improved opacification of the right vertebral artery beyond the PICA origin probably related to retrograde flow. Basilar artery is patent. Posterior cerebral arteries are patent. Bilateral posterior communicating arteries are present, right larger than left. Venous sinuses: Not well evaluated. IMPRESSION: There is no acute intracranial hemorrhage or evidence of acute infarction. ASPECT score is 10. 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 Patent intracranially with greater flow beyond PICA origin likely from retrograde flow. Otherwise, no hemodynamically significant stenosis. Initial results were communicated to Dr. Cheral Marker at 2:39 p.m. on 01/11/2021 by text page via the [facility] messaging system. Electronically Signed   By: Macy Mis M.D.   On: 01/11/2021 14:50    Labs:  Basic Metabolic Panel: Recent Labs  Lab 01/24/21 0523  NA 137  K 4.3  CL 102  CO2 29  GLUCOSE 104*  BUN 29*  CREATININE 0.88  CALCIUM 9.4    CBC: Recent Labs  Lab 01/24/21 0523  WBC 6.7  NEUTROABS 4.4  HGB 13.3  HCT 37.5  MCV 92.4  PLT 278    CBG: No results for input(s): GLUCAP in the last 168 hours.  Family history.  Father with CAD hypertension.  Mother with hypertension hyperlipidemia.  Maternal grandmother with breast cancer.  Denies any colon cancer esophageal cancer or rectal cancer  Brief HPI:   [name] [name] is a 70 y.o. right-handed female with history of severe mitral regurgitation status post mitral valve replacement 2013 maintained on chronic Coumadin, PAF, hypertension, COPD, remote history of MS with neurogenic bladder diastolic congestive heart failure irritable bowel syndrome.  Per chart review lives alone independent prior to admission with assistive device.  Two-level home 4 steps to entry.  Neighbors help provide transportation to appointments.  Presented 01/11/2021 with acute onset of right side weakness and aphasia.  Cranial CT scan showed no acute intracranial hemorrhage or evidence of acute infarction.  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  No hemodynamically significant stenosis.  Patient did not receive TPA.  MRI showed a wedge-shaped area of diffusion on the left paramedian pons suspicious for acute infarction.  Stable burden of chronic  demyelinating disease.  No evidence of active demyelination.  MRI cervical spine no cord signal abnormality or impingement.  Admission chemistries unremarkable except BUN 27 glucose 143 INR 2.2 hemoglobin 14.3 urinalysis positive nitrite.  Echocardiogram with ejection fraction of 65 to 70% no wall motion abnormalities.  Patient remained on Coumadin as prior to admission with the addition of low-dose aspirin.  She was completing a course of Rocephin for UTI with urine cultures pending.  Tolerating a regular diet.  Therapy evaluations completed due to patient's right side weakness and aphasia was admitted for a comprehensive rehab program   Hospital Course: [name] [name] was admitted to rehab 01/13/2021 for inpatient therapies to consist of PT, ST and OT at least three hours five days a week. Past admission physiatrist, therapy team and rehab RN have worked together to provide customized  collaborative inpatient rehab.  Pertaining to patient's left pontine cerebellum CVA remained stable she remained on chronic Coumadin therapy no bleeding episodes she would also continue low-dose aspirin and follow-up neurology services.  Recurrent UTIs urine culture showing multiple species completed course of antibiotics for 11/07/2020 she remained afebrile.  Blood pressure controlled on low-dose beta-blocker Lopressor 25 mg twice daily as well as lisinopril.  Hormone supplement ongoing for hypothyroidism.  Lipitor for hyperlipidemia.  Noted history of severe mitral regurgitation status post mitral valve replacement 2013 continued on chronic Coumadin.  She did have a history of diastolic congestive heart failure exhibited no signs of fluid overload.  COPD oxygen saturations greater than 90% on room air.  Multiple sclerosis diagnosed 1994 spastic bladder patient never been on modifying therapy due to concerns for potential side effects but has not had any reported documented MS flareup since diagnosis.  PAF cardiac rate controlled  continue Lopressor and Coumadin therapy.  Bouts of constipation resolved with laxative assistance.   Blood pressures were monitored on TID basis and controlled     Rehab course: During patient's stay in rehab weekly team conferences were held to monitor patient's progress, set goals and discuss barriers to discharge. At admission, patient required minimal guard sit to stand minimal guard ambulation 6 feet rolling walker minimal assist supine to sit max assist lower body bathing max is lower body dressing minimal assist toilet transfers  Physical exam.  Blood pressure 144/73 pulse 74 temperature 98 respirations 18 oxygen saturation 96% room air Neurologic.  Alert oriented makes eye contact with examiner some delay in processing with low tone.  She was able to speak short phrases.  Motor strength 5/5 in left and 4/5 in right deltoid bicep tricep grip hip flexion knee extensors ankle dorsiflexion plantarflexion.  Sensation intact to light touch General.  No acute distress HEENT Head.  Normocephalic and atraumatic Eyes.  Pupils round and reactive to light no discharge.nystagmus Neck.  Supple nontender no JVD without thyromegaly Cardiac regular rate and rhythm no extra sounds or murmur heard Abdomen.  Soft nontender positive bowel sounds without rebound Respiratory effort normal no respiratory distress without wheeze  He/She  has had improvement in activity tolerance, balance, postural control as well as ability to compensate for deficits. He/She has had improvement in functional use RUE/LUE  and RLE/LLE as well as improvement in awareness.  Sit to stand with supervision with rolling walker ambulates 175 feet supervision rolling walker.  Her daughter-in-law was attending family education.  Focus of session to perform gait outdoors on unlevel concrete supervision rolling walker.  Perform sit to stand transfers from low sitting benches in chairs with supervision.  ADLs focused on toileting bathing  dressing.  She used a rolling walker to ambulate into the bathroom.  Required set up for upper body bathing set up for lower body bathing independent upper body dressing set up lower body dressing.  Full family teaching completed plan discharged to home       Disposition: Discharged to home    Diet: Regular  Special Instructions: No driving smoking or alcohol   Home health nurse to check INR on Monday, 02/03/2021 results to [facility] [phone] fax [phone] adjusted accordingly for INR 2.5-3.5  Medications at discharge 1.  Tylenol as needed 2.  Lipitor 80 mg p.o. daily 3.  Os-Cal 500 mg p.o. daily 4.  Vitamin D 6000 units p.o. daily 5.  Lisinopril 5 mg p.o. daily 6.  Antivert 12.5 mg p.o.  twice daily as needed 7.  Lopressor 25 mg p.o. twice daily 8.  Thyroid 90 mg p.o. daily 9.  Coumadin latest dose 5 mg adjusted accordingly for INR 2.5-3.5  30-35 minutes were spent completing discharge summary and discharge planning  Discharge Instructions    Ambulatory referral to Neurology   Complete by: As directed    An appointment is requested in approximately 4 weeks brainstem infarction   Ambulatory referral to Physical Medicine Rehab   Complete by: As directed    Moderate complexity follow-up 1 to 2 weeks brainstem infarction       Follow-up Information    Jamse Arn, MD Follow up.   Specialty: Physical Medicine and Rehabilitation Why: Office to call for appointment Contact information: [address] [place] [address] [phone]        Dorothy Spark, MD Follow up.   Specialty: Cardiology Why: Call for appointment Contact information: [address] [place] [id number] [phone]               Signed: Lavon Paganini [place] 01/30/2021, 5:12 AM

## 2021-01-29 LAB — PROTIME-INR
INR: 3.4 — ABNORMAL HIGH (ref 0.8–1.2)
Prothrombin Time: 34.1 seconds — ABNORMAL HIGH (ref 11.4–15.2)

## 2021-01-29 MED ORDER — ARMOUR THYROID 90 MG PO TABS: 90.0000 mg | ORAL_TABLET | Freq: Every day | ORAL | 0 refills | Status: DC

## 2021-01-29 MED ORDER — ATORVASTATIN CALCIUM 80 MG PO TABS: 80.0000 mg | ORAL_TABLET | Freq: Every day | ORAL | 0 refills | Status: DC

## 2021-01-29 MED ORDER — ACETAMINOPHEN 325 MG PO TABS: 650.0000 mg | ORAL_TABLET | ORAL | Status: DC | PRN

## 2021-01-29 MED ORDER — METOPROLOL TARTRATE 25 MG PO TABS: ORAL_TABLET | ORAL | 3 refills | Status: DC

## 2021-01-29 MED ORDER — LISINOPRIL 5 MG PO TABS: 5.0000 mg | ORAL_TABLET | Freq: Every day | ORAL | 1 refills | Status: DC

## 2021-01-29 MED ORDER — WARFARIN SODIUM 2.5 MG PO TABS
2.5000 mg | ORAL_TABLET | Freq: Once | ORAL | Status: AC
  Administered 2021-01-29: 2.5 mg via ORAL
  Filled 2021-01-29: qty 1

## 2021-01-29 MED ORDER — MECLIZINE HCL 12.5 MG PO TABS: 12.5000 mg | ORAL_TABLET | Freq: Two times a day (BID) | ORAL | 0 refills | Status: DC | PRN

## 2021-01-29 NOTE — Patient Care Conference (Signed)
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 Leeroy Cha Care Coodinator Present: Dorien Chihuahua, RN, BSN, CRRN;Becky Dupree, LCSW Nurse Present: Dorien Chihuahua, RN PT Present: Ginnie Smart, PT OT Present: Meriel Pica, OT PPS Coordinator present : Gunnar Fusi, SLP     Current Status/Progress Goal Weekly Team Focus  Bowel/Bladder   Pt is cont B/B. incont at times due ti neurogenic bladder. last BM-4/12  Pt will be contx2  Assess qshift and PRN   Swallow/Nutrition/ Hydration             ADL's   very close to meeting mod I goals, ready for discharge on thursday  Mod I with BASIC ADLs  ADL training, balance, pt education   Mobility   mod I bed mobility, mod I transfers with RW, supervision gait 277ft with RW, supervision 12 steps wtih bilateral hand rails  mod I (except stairs, supervision)  Gait and Stairs, pt education, DC planning, home safety training   Communication             Safety/Cognition/ Behavioral Observations            Pain   No c/o pain  pain<3  of 10  assess pain q shift and PRN   Skin   intact  Pts skin will be free of infection and no breakdown.  assess skin q shift and PRN     Discharge Planning:  Pt meeting goals for discharge tomorrow, has done very well here and made much progress while here. HOme health arranged   Team Discussion: Vertebral artery insufficiency confirmed; Vestibular therapy follow up OP after discharge.  Hx of neurogenic bladder. MD recommend urology follow up after discharge. Otherwise medically stable. Patient on target to meet rehab goals: yes, currently  Mod I with ADLs and PT  *See Care Plan and progress notes for long and short-term goals.   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: Yes Analysis of laboratory values and/or radiology reports with any subsequent  need for medication adjustment and/or medical intervention. : Yes   I attest that I was present, lead the team conference, and concur with the assessment and plan of the team.   Dorien Chihuahua B 01/29/2021, 2:06 PM

## 2021-01-29 NOTE — Discharge Instructions (Signed)
STROKE/TIA DISCHARGE INSTRUCTIONS SMOKING Cigarette smoking nearly doubles your risk of having a stroke & is the single most alterable risk factor  If you smoke or have smoked in the last 12 months, you are advised to quit smoking for your health.  Most of the excess cardiovascular risk related to smoking disappears within a year of stopping.  Ask you doctor about anti-smoking medications  [place] Quit Line: 1-800-QUIT NOW  Free Smoking Cessation Classes [phone]  CHOLESTEROL Know your levels; limit fat & cholesterol in your diet  Lipid Panel     Component Value Date/Time   CHOL 204 (H) 01/12/2021 0207   TRIG 53 01/12/2021 0207   HDL 62 01/12/2021 0207   CHOLHDL 3.3 01/12/2021 0207   VLDL 11 01/12/2021 0207   LDLCALC 131 (H) 01/12/2021 0207      Many patients benefit from treatment even if their cholesterol is at goal.  Goal: Total Cholesterol (CHOL) less than 160  Goal:  Triglycerides (TRIG) less than 150  Goal:  HDL greater than 40  Goal:  LDL (LDLCALC) less than 100   BLOOD PRESSURE American Stroke Association blood pressure target is less that 120/80 mm/Hg  Your discharge blood pressure is:  BP: (!) 148/76  Monitor your blood pressure  Limit your salt and alcohol intake  Many individuals will require more than one medication for high blood pressure  DIABETES (A1c is a blood sugar average for last 3 months) Goal HGBA1c is under 7% (HBGA1c is blood sugar average for last 3 months)  Diabetes: No known diagnosis of diabetes    Lab Results  Component Value Date   HGBA1C 5.5 01/12/2021     Your HGBA1c can be lowered with medications, healthy diet, and exercise.  Check your blood sugar as directed by your physician  Call your physician if you experience unexplained or low blood sugars.  PHYSICAL ACTIVITY/REHABILITATION Goal is 30 minutes at least 4 days per week  Activity: Increase activity slowly, Therapies: Physical Therapy: Home Health Return to work:    Activity decreases your risk of heart attack and stroke and makes your heart stronger.  It helps control your weight and blood pressure; helps you relax and can improve your mood.  Participate in a regular exercise program.  Talk with your doctor about the best form of exercise for you (dancing, walking, swimming, cycling).  DIET/WEIGHT Goal is to maintain a healthy weight  Your discharge diet is:  Diet Order            Diet Heart Room service appropriate? Yes; Fluid consistency: Thin  Diet effective now                 liquids Your height is:  Height: 5' 6.5" (168.9 cm) Your current weight is: Weight: 64 kg Your Body Mass Index (BMI) is:  BMI (Calculated): 22.43  Following the type of diet specifically designed for you will help prevent another stroke.  Your goal weight range is:    Your goal Body Mass Index (BMI) is 19-24.  Healthy food habits can help reduce 3 risk factors for stroke:  High cholesterol, hypertension, and excess weight.  RESOURCES Stroke/Support Group:  Call [phone]   STROKE EDUCATION PROVIDED/REVIEWED AND GIVEN TO PATIENT Stroke warning signs and symptoms How to activate emergency medical system (call 911). Medications prescribed at discharge. Need for follow-up after discharge. Personal risk factors for stroke. Pneumonia vaccine given:  Flu vaccine given:  My questions have been answered, the writing is legible,  and I understand these instructions.  I will adhere to these goals & educational materials that have been provided to me after my discharge from the hospital.   Inpatient Rehab Discharge Instructions  [name] [name] Discharge date and time: No discharge date for patient encounter.   Activities/Precautions/ Functional Status: Activity: activity as tolerated Diet: regular diet Wound Care: Routine skin checks Functional status:  ___ No restrictions     ___ Walk up steps independently ___ 24/7 supervision/assistance   ___ Walk up steps  with assistance ___ Intermittent supervision/assistance  ___ Bathe/dress independently ___ Walk with walker     _x__ Bathe/dress with assistance ___ Walk Independently    ___ Shower independently ___ Walk with assistance    ___ Shower with assistance _x__ No alcohol     ___ Return to work/school ________  Special Instructions: No driving smoking or alcohol  Home health nurse to check INR on 02/03/2021   results to [place] [facility] [phone] fax [phone]    [place]:    Home Health:   [place] Phone:[phone]   Medical Equipment/Items Ordered:NO NEEDS HAS [place] ADMITS                                                 Agency/Supplier:NA   My questions have been answered and I understand these instructions. I will adhere to these goals and the provided educational materials after my discharge from the hospital.  Patient/Caregiver Signature _______________________________ Date __________  Clinician Signature _______________________________________ Date __________  Please bring this form and your medication list with you to all your follow-up doctor's appointments.

## 2021-01-29 NOTE — Progress Notes (Signed)
Occupational Therapy Session Note  Patient Details  Name: [name] [name] MRN: [id number] Date of Birth: 1951/07/20  Today's Date: 01/29/2021 OT Individual Time: 1122-1205 OT Individual Time Calculation (min): 43 min    Short Term Goals: Week 2:  OT Short Term Goal 1 (Week 2): STGs = LTGs  Skilled Therapeutic Interventions/Progress Updates:    Pt received standing in room with RW, agreeable to therapy. Pt made mod I in room with RW earlier this date per sign on door. Session focus on dynamic standing balance, energy conservation, cleaning room and packing in prep for DC tomorrow. Pt demonstrates good safety awareness, energy conservation strategies, and problem solving to pack bags, transport items, and pick up items off of floor. Completed tasks at mod I level with RW. Pt left seated EOB in room with call bell and all immediate needs in reach.   Therapy Documentation Precautions:  Precautions Precautions: Fall Precaution Comments: Neurogenic bladder - incontinent (premorbid). Torticollis Restrictions Weight Bearing Restrictions: No Pain: Pain Assessment Pain Scale: 0-10 Pain Score: 0-No pain ADL: See Care Tool for more details.  Therapy/Group: Individual Therapy  Volanda Napoleon MS, OTR/L  01/29/2021, 12:14 PM

## 2021-01-29 NOTE — Progress Notes (Signed)
Patient ID: [name] [name], female   DOB: 1951-01-23, 70 y.o.   MRN: [id number]  Met with pt to confirm team conference and discharge tomorrow. She is aware home health will contact her and set up therapy appointments. Made mod/i in her room this afternoon, to build her confidence before going home.

## 2021-01-29 NOTE — Progress Notes (Signed)
Occupational Therapy Session Note  Patient Details  Name: [name] [name] MRN: [id number] Date of Birth: Dec 26, 1950  Today's Date: 01/29/2021 OT Individual Time: 5465-0354 OT Individual Time Calculation (min): 75 min    Short Term Goals: Week 2:  OT Short Term Goal 1 (Week 2): STGs = LTGs  Skilled Therapeutic Interventions/Progress Updates:    Pt seen for ADL training with a focus on balance and endurance while completing all of her self care, organizing her room, initiating packing to go home tomorrow, doing light housekeeping with making the bed, donning pillow cases, and cleaning bathroom sink. Pt did extremely well and able to accomplish all tasks at a Mod I level.  Discussed meal prep and pt plans to continue to order frozen prepped meal from local store and just heat them in the oven. Recommended she consider a toaster oven so she does not have to reach forward.   Pt is now Mod independent in her room, but is aware to call for help is she is not feeling well.    Therapy Documentation Precautions:  Precautions Precautions: Fall Precaution Comments: Neurogenic bladder - incontinent (premorbid). Torticollis Restrictions Weight Bearing Restrictions: No   Pain: Pain Assessment Pain Scale: 0-10 Pain Score: 0-No pain ADL: ADL Eating: Independent Grooming: Independent Where Assessed-Grooming: Standing at sink Upper Body Bathing: Independent Where Assessed-Upper Body Bathing: Shower Lower Body Bathing: Modified independent Where Assessed-Lower Body Bathing: Shower Upper Body Dressing: Independent Where Assessed-Upper Body Dressing: Edge of bed Lower Body Dressing: Modified independent Where Assessed-Lower Body Dressing: Edge of bed Toileting: Modified independent Where Assessed-Toileting: Glass blower/designer: Diplomatic Services operational officer Method: Human resources officer: Modified independent Clinical cytogeneticist Method: Optometrist:  Transfer tub bench,Grab bars,Walk in Retail buyer: Modified independent   Therapy/Group: Individual Therapy  [place] 01/29/2021, 11:11 AM

## 2021-01-29 NOTE — Progress Notes (Signed)
Occupational Therapy Discharge Summary  Patient Details  Name: [name] [name] MRN: [id number] Date of Birth: November 19, 1950     Patient has met 10 of 10 long term goals due to improved activity tolerance, improved balance, postural control and ability to compensate for deficits.  Patient to discharge at overall Modified Independent level.  Patient's care partner is independent to provide the necessary physical assistance at discharge.    Reasons goals not met: n/a  Recommendation:  Patient will benefit from ongoing skilled OT services in home health setting to continue to advance functional skills in the area of iADL.  Equipment: No equipment provided  Reasons for discharge: treatment goals met  Patient/family agrees with progress made and goals achieved: Yes  OT Discharge Precautions/Restrictions  Precautions Precautions: Fall Precaution Comments: Neurogenic bladder - incontinent (premorbid). Torticollis Restrictions Weight Bearing Restrictions: No   Pain Pain Assessment Pain Scale: 0-10 Pain Score: 0-No pain ADL ADL Eating: Independent Grooming: Independent Where Assessed-Grooming: Standing at sink Upper Body Bathing: Independent Where Assessed-Upper Body Bathing: Shower Lower Body Bathing: Modified independent Where Assessed-Lower Body Bathing: Shower Upper Body Dressing: Independent Where Assessed-Upper Body Dressing: Edge of bed Lower Body Dressing: Modified independent Where Assessed-Lower Body Dressing: Edge of bed Toileting: Modified independent Where Assessed-Toileting: Glass blower/designer: Diplomatic Services operational officer Method: Human resources officer: Modified independent Clinical cytogeneticist Method: Optometrist: Transfer tub bench,Grab bars,Walk in Retail buyer: Modified independent Vision Baseline Vision/History: Wears glasses Wears Glasses: At all times Patient Visual Report: No change from  baseline Vision Assessment?: No apparent visual deficits Eye Alignment: Within Functional Limits Ocular Range of Motion: Within Functional Limits Perception  Perception: Within Functional Limits Praxis Praxis: Intact Cognition Overall Cognitive Status: Within Functional Limits for tasks assessed Arousal/Alertness: Awake/alert Orientation Level: Oriented X4 Attention: Focused;Sustained;Divided Focused Attention: Appears intact Sustained Attention: Appears intact Divided Attention: Appears intact Memory: Appears intact Problem Solving: Appears intact Problem Solving Impairment: Verbal complex;Functional complex Safety/Judgment: Appears intact Sensation Sensation Light Touch: Appears Intact Hot/Cold: Appears Intact Proprioception: Appears Intact Stereognosis: Appears Intact Additional Comments: Sometimes demonstrates decreased awareness of LLE during functional mobility tasks but this has improved since eval Coordination Gross Motor Movements are Fluid and Coordinated: No Coordination and Movement Description: slowed and effortful but capable movement patterns Motor  Motor Motor: Abnormal postural alignment and control Motor - Discharge Observations: Torticollis - R cervical rotation with L lateral flexion of cervical spine (premorbid, per pt) Mobility  Bed Mobility Bed Mobility: Supine to Sit;Sit to Supine Supine to Sit: Independent Sit to Supine: Independent Transfers Sit to Stand: Independent with assistive device Stand to Sit: Independent with assistive device  Trunk/Postural Assessment  Cervical Assessment Cervical Assessment: Exceptions to [facility] (torticollis and forward head carriage) Thoracic Assessment Thoracic Assessment: Exceptions to [facility] (rounded shoulders) Lumbar Assessment Lumbar Assessment: Exceptions to [facility] (posterior pelvic tilt) Postural Control Postural Control: Within Functional Limits  Balance Balance Balance Assessed: Yes Static Sitting  Balance Static Sitting - Balance Support: Feet supported Static Sitting - Level of Assistance: 7: Independent Dynamic Sitting Balance Dynamic Sitting - Balance Support: Feet supported;During functional activity Dynamic Sitting - Level of Assistance: 7: Independent Static Standing Balance Static Standing - Balance Support: During functional activity Static Standing - Level of Assistance: 6: Modified independent (Device/Increase time) (RW) Dynamic Standing Balance Dynamic Standing - Balance Support: During functional activity Dynamic Standing - Level of Assistance: Mod I with self care Extremity/Trunk Assessment RUE Assessment RUE Assessment: Within Functional Limits LUE Assessment LUE Assessment: Within Functional Limits  [place] 01/29/2021, 11:12 AM

## 2021-01-29 NOTE — Progress Notes (Signed)
Inpatient Rehabilitation Care Coordinator Discharge Note  The overall goal for the admission was met for:   Discharge location: Yes-HOME WITH INTERMITTENT ASSIST FROM NEIGHBORS AND DAUGHTER IN-[place]  Length of Stay: Yes-17 DAYS  Discharge activity level: Yes  Home/community participation: Yes  Services provided included: MD, RD, PT, OT, RN, CM, TR, Pharmacy, Neuropsych and SW  Financial Services: Medicare and Private Insurance: Museum/gallery exhibitions officer offered to/list presented to:YES  Follow-up services arranged: Home Health: [place] and Patient/Family request agency HH: HAS USED BEFORE, DME: NO NEEDS HAS ALL EQUIPMENT FROM PAST ADMISSIONS  Comments (or additional information):PT DID WELL AND MEET HER MOD/I LEVEL TO [facility] SAFELY  Patient/Family verbalized understanding of follow-up arrangements: Yes  Individual responsible for coordination of the follow-up plan: SELF [phone]  Confirmed correct DME delivered: Elease Hashimoto 01/29/2021    Vangie Henthorn, [place]

## 2021-01-29 NOTE — Progress Notes (Signed)
ANTICOAGULATION CONSULT NOTE  Pharmacy Consult for Warfarin Indication: mechanical MVR  Allergies  Allergen Reactions  . Erythromycin Nausea And Vomiting  . Valsartan Other (See Comments)    Pt reports caused her depression    Patient Measurements: Height: 5' 6.5" (168.9 cm) Weight: 66.3 kg (146 lb 2.6 oz) IBW/kg (Calculated) : 60.45  Vital Signs: Temp: 98 F (36.7 C) (04/13 0455) Temp Source: Oral (04/13 0455) BP: 138/75 (04/13 0455) Pulse Rate: 70 (04/13 0455)  Labs: Recent Labs    01/27/21 0630 01/29/21 0702  LABPROT 30.2* 34.1*  INR 3.0* 3.4*    Estimated Creatinine Clearance: 56 mL/min (by C-G formula based on SCr of 0.88 mg/dL).   Assessment: Patient is a 70 yo F that presented to the ED with concerns for a stroke.  Transferred to inpatient rehab 3/28.  The patient is on warfarin PTA for a mechanical MVR with an INR goal of 2.5-3.5.   PTA Regimen: 7.5mg  M /Thur and 5mg  all other days  INR has trended up during rehab stay and been supratherapeutic at times.  Near upper end of goal at 3.4 today.  No interactions noted and has been eating well. Last CBC 4/8 wnl/stable, no bleeding noted.  Goal of Therapy:  INR 2.5-3.5 Monitor platelets by anticoagulation protocol: Yes   Plan:  - Warfarin 2.5mg  PO today - Consider reducing dose to Warfarin 5mg  daily on discharge - Monitor patient for s/sx of bleeding   Thank you for involving pharmacy in this patient's care.  Horton Chin, PharmD, BCPS Clinical Pharmacist Clinical phone for 01/29/2021 until 3p is [phone] 01/29/2021 12:43 PM  **Pharmacist phone directory can be found on [email or web address] listed under [place]**

## 2021-01-29 NOTE — Discharge Summary (Signed)
Physical Therapy Discharge Summary  Patient Details  Name: [name] [name] MRN: [id number] Date of Birth: 1950-11-06  Today's Date: 01/29/2021 PT Individual Time: 3825-0539 PT Individual Time Calculation (min): 69 min    Patient has met 9 of 9 long term goals due to improved activity tolerance, improved balance, improved postural control, increased strength and ability to compensate for deficits.  Patient to discharge at an ambulatory level Modified Independent.   Patient's care partner is independent to provide the necessary physical assistance at discharge. Patient will be receiving intermittent assistance/supervision from neighbors, friends, and family.  Reasons goals not met: n/a  Recommendation:  Patient will benefit from ongoing skilled PT services in home health setting to continue to advance safe functional mobility, address ongoing impairments in dynamic balance, general strengthening, postural training, gait deficits in order to minimize fall risk.  Equipment: No equipment provided. Pt has all needed DME  Reasons for discharge: treatment goals met and discharge from hospital  Patient/family agrees with progress made and goals achieved: Yes  PT Discharge Precautions/Restrictions Precautions Precautions: Fall Precaution Comments: Neurogenic bladder - incontinent (premorbid). Torticollis Restrictions Weight Bearing Restrictions: No Vision/Perception  Vision - Assessment Eye Alignment: Within Functional Limits Ocular Range of Motion: Within Functional Limits Perception Perception: Within Functional Limits Praxis Praxis: Intact  Cognition Overall Cognitive Status: Within Functional Limits for tasks assessed Arousal/Alertness: Awake/alert Orientation Level: Oriented X4 Attention: Focused;Sustained;Divided Focused Attention: Appears intact Sustained Attention: Appears intact Divided Attention: Appears intact Memory: Appears intact Problem Solving: Appears  intact Problem Solving Impairment: Verbal complex;Functional complex Safety/Judgment: Appears intact Sensation Sensation Light Touch: Appears Intact Hot/Cold: Appears Intact Proprioception: Appears Intact Stereognosis: Appears Intact Additional Comments: Sometimes demonstrates decreased awareness of LLE during functional mobility tasks but this has improved since eval Coordination Gross Motor Movements are Fluid and Coordinated: No Coordination and Movement Description: slowed and effortful but capable movement patterns Motor  Motor Motor: Abnormal postural alignment and control Motor - Discharge Observations: Torticollis - R cervical rotation with L lateral flexion of cervical spine (premorbid, per pt)  Mobility Bed Mobility Bed Mobility: Supine to Sit;Sit to Supine Supine to Sit: Independent Sit to Supine: Independent Transfers Transfers: Sit to Stand;Stand Pivot Transfers;Stand to Sit Sit to Stand: Independent with assistive device Stand to Sit: Independent with assistive device Stand Pivot Transfers: Independent with assistive device Transfer (Assistive device): Rolling walker Locomotion  Gait Ambulation: Yes Gait Assistance: Independent with assistive device Gait Distance (Feet): 200 Feet Assistive device: Rolling walker Gait Gait: Yes Gait Pattern: Within Functional Limits Gait Pattern: Step-to pattern;Wide base of support;Trunk flexed;Poor foot clearance - left;Poor foot clearance - right;Right flexed knee in stance;Left flexed knee in stance;Decreased step length - right;Decreased step length - left;Decreased stride length Gait velocity: decreased Stairs / Additional Locomotion Stairs: Yes Stairs Assistance: Independent Stair Management Technique: Two rails Number of Stairs: 12 Height of Stairs: 6 Wheelchair Mobility Wheelchair Mobility: No  Trunk/Postural Assessment  Cervical Assessment Cervical Assessment: Exceptions to [facility] (torticollis and forward head  carriage) Thoracic Assessment Thoracic Assessment: Exceptions to [facility] (rounded shoulders) Lumbar Assessment Lumbar Assessment: Exceptions to [facility] (posterior pelvic tilt) Postural Control Postural Control: Within Functional Limits  Balance Balance Balance Assessed: Yes Static Sitting Balance Static Sitting - Balance Support: Feet supported Static Sitting - Level of Assistance: 7: Independent Dynamic Sitting Balance Dynamic Sitting - Balance Support: Feet supported;During functional activity Dynamic Sitting - Level of Assistance: 7: Independent Static Standing Balance Static Standing - Balance Support: During functional activity Static Standing - Level of Assistance:  6: Modified independent (Device/Increase time) (RW) Dynamic Standing Balance Dynamic Standing - Balance Support: During functional activity Dynamic Standing - Level of Assistance: 5: Stand by assistance Dynamic Standing - Balance Activities: Foam balance beam;Reaching for objects;[place];[employer] Extremity Assessment   RLE Assessment RLE Assessment: Within Functional Limits General Strength Comments: Grossly 4+/5 LLE Assessment LLE Assessment: Within Functional Limits General Strength Comments: Grossly 4+/5  Skilled Intervention: Pt greeted supine in bed, agreeable to therapy. No reports of pain. Bed mobility completed mod I with bed features. Pt donned shoes and socks indep while seated EOB. Sit<>stand mod I to RW from EOB and she ambulated ~172f mod I with RW to ortho gym. Completed car transfer mod I with RW with car height set to simulate that of [place]. Pt reports her neighbor JHeron Sabinswill be supervising her while performing car transfers. Pt then ambulated ~1530fto main rehab gym mod I with RW. Completed stair training, navigated up/down x12 steps with supervision and bilateral hand rails with step-to pattern for both ascent and descent. Ambulated back to her room mod I, ~1752fwith RW. Gait deficits continue to show  step-to pattern with short shuffling steps and sometimes has difficulty with maintaining proximity to her RW. However, she does initiate self corrections for these deficits but has difficulty maintaining corrections. Pt ended session supine in bed, completing bed mobility mod I. Reviewed home safety, role of f/u therapies, utilizing family/friend assist as needed, etc. Pt reports feeling confident in returning home and is thankful for therapist education and interventions during her rehab stay. Needs within reach at end of session.   Adyn Serna P Keiyon Plack PT, DPT 01/29/2021, 7:39 AM

## 2021-01-29 NOTE — Progress Notes (Signed)
[place] PHYSICAL MEDICINE & REHABILITATION PROGRESS NOTE  Subjective/Complaints:  Discussed results of vestibular eval yesterday. She is happy to have PRN meclizine at home Discussed her medical history She is ready for d/c tomorrow  ROS: denies CP, SOB, N/V/D  Objective: Vital Signs: Blood pressure 138/75, pulse 70, temperature 98 F (36.7 C), temperature source Oral, resp. rate 16, height 5' 6.5" (1.689 m), weight 66.3 kg, SpO2 100 %. No results found. No results for input(s): WBC, HGB, HCT, PLT in the last 72 hours. No results for input(s): NA, K, CL, CO2, GLUCOSE, BUN, CREATININE, CALCIUM in the last 72 hours.  Intake/Output Summary (Last 24 hours) at 01/29/2021 1027 Last data filed at 01/29/2021 0758 Gross per 24 hour  Intake 354 ml  Output --  Net 354 ml    Physical Exam: BP 138/75 (BP Location: Left Arm)   Pulse 70   Temp 98 F (36.7 C) (Oral)   Resp 16   Ht 5' 6.5" (1.689 m)   Wt 66.3 kg   SpO2 100%   BMI 23.24 kg/m   Gen: no distress, normal appearing HEENT: oral mucosa pink and moist, NCAT Cardio: Reg rate Chest: normal effort, normal rate of breathing Abd: soft, non-distended Ext: no edema Psych: pleasant, normal affect Skin:  Cervical kyphosis, unchanged Neurologic: Alert Motor: 4-4+/5 throughout, right slightly weaker than left, stable + Torticollis, unchanged  Assessment/Plan: 1. Functional deficits which require 3+ hours per day of interdisciplinary therapy in a comprehensive inpatient rehab setting.  Physiatrist is providing close team supervision and 24 hour management of active medical problems listed below.  Physiatrist and rehab team continue to assess barriers to discharge/monitor patient progress toward functional and medical goals   Care Tool:  Bathing    Body parts bathed by patient: Right arm,Left arm,Chest,Abdomen,Front perineal area,Buttocks,Right upper leg,Left upper leg,Face,Right lower leg,Left lower leg         Bathing  assist Assist Level: Set up assist     Upper Body Dressing/Undressing Upper body dressing   What is the patient wearing?: Pull over shirt,Bra    Upper body assist Assist Level: Independent    Lower Body Dressing/Undressing Lower body dressing      What is the patient wearing?: Incontinence brief,Pants     Lower body assist Assist for lower body dressing: Supervision/Verbal cueing     Toileting Toileting    Toileting assist Assist for toileting: Supervision/Verbal cueing     Transfers Chair/bed transfer  Transfers assist  Chair/bed transfer activity did not occur: Safety/medical concerns  Chair/bed transfer assist level: Supervision/Verbal cueing     Locomotion Ambulation   Ambulation assist      Assist level: Supervision/Verbal cueing Assistive device: Walker-rolling Max distance: 150   Walk 10 feet activity   Assist     Assist level: Supervision/Verbal cueing Assistive device: Walker-rolling   Walk 50 feet activity   Assist Walk 50 feet with 2 turns activity did not occur: Safety/medical concerns  Assist level: Supervision/Verbal cueing Assistive device: Walker-rolling    Walk 150 feet activity   Assist Walk 150 feet activity did not occur: Safety/medical concerns  Assist level: Supervision/Verbal cueing Assistive device: Walker-rolling    Walk 10 feet on uneven surface  activity   Assist Walk 10 feet on uneven surfaces activity did not occur: Safety/medical concerns         Wheelchair     Assist Will patient use wheelchair at discharge?: No   Wheelchair activity did not occur: N/A  Wheelchair 50 feet with 2 turns activity    Assist    Wheelchair 50 feet with 2 turns activity did not occur: N/A       Wheelchair 150 feet activity     Assist  Wheelchair 150 feet activity did not occur: N/A        Medical Problem List and Plan: 1.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 Antithrombotics: -DVT/anticoagulation:Continue Coumadin (2.5-3.5)  INR therapeutic on 4/13 -antiplatelet therapy: Aspirin 81 mg daily 3. Pain Management:Tylenol as needed 4. Mood:Provide emotional support -antipsychotic agents: N/A 5. Neuropsych: This patientiscapable of making decisions on hisown behalf. 6. Skin/Wound Care:Routine skin checks 7. Fluids/Electrolytes/Nutrition:Routine in and outs  BMP within acceptable range on 4/8 8. UTI.   Urinalysis positive nitrite, Urine culture showing multiple species  Completed course of antibiotics on 4/1 9. Hypertension. ContinueLopressor 25 mg twice daily.  Vitals:   01/28/21 1848 01/29/21 0455  BP: (!) 111/53 138/75  Pulse: 78 70  Resp: 19 16  Temp: 97.9 F (36.6 C) 98 F (36.7 C)  SpO2: 97% 100%   10. Hypothyroidism. TSH 0.371. Continue thyroid 90 mg daily 11. Hyperlipidemia. Lipitor 12. History of severe mitral regurgitation status post metal mitral valve replacement 2013. Continue chronic Coumadin. 13. Diastolic congestive heart failure. Monitor for any signs of fluid overload Filed Weights   01/27/21 0515 01/28/21 0438 01/29/21 0458  Weight: 65 kg 65.4 kg 66.3 kg   ?  Reliability on 4/8 14. COPD. Check oxygen saturations every shift  No breathing issues on 4/11  To monitor with increased exertion 15. 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 Patient has never been on modifying therapy due to concerns for potential side effects but has not had any reported documented MS flare since diagnosis.  Patient states she urinates every 5 minutes and wears depends at home, which were not available to her, daughter-in-law to bring in  Pure wick nightly  She has tried injections and medications in past without benefit 16.  Paroxysmal Atrial Fibrillation- lopressor, continue Warfarin  Patient states she has had a maze, ablation in the past and  notes since that time she has only been in atrial fibrillation 1 time and that was in 2015.  ECG reviewed, showing PVCs 17.  Torticollis with cervical kyphosis  Patient wary of injections, and notes she has grown accustomed to it, is willing to work with therapies to improve range of motion   Discussed with therapies ROM 18.  Slow transit constipation  May take prune juice prn  Improving 19.  Prolonged QTC  Noted on ECG 20.  Elevated BUN  Encourage fluids 21.  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   LOS: 16 days A FACE TO FACE EVALUATION WAS PERFORMED  Clide Deutscher [name] [name] 01/29/2021, 10:27 AM

## 2021-01-30 MED ORDER — WARFARIN SODIUM 5 MG PO TABS: 5.0000 mg | ORAL_TABLET | Freq: Every day | ORAL | 11 refills | Status: DC

## 2021-01-30 NOTE — Progress Notes (Signed)
[place] PHYSICAL MEDICINE & REHABILITATION PROGRESS NOTE  Subjective/Complaints:  Patient's chart reviewed- No issues reported overnight Vitals signs stable Stable for d/c today  ROS: denies CP, SOB, N/V/D  Objective: Vital Signs: Blood pressure 132/62, pulse 66, temperature 97.8 F (36.6 C), temperature source Oral, resp. rate 20, height 5' 6.5" (1.689 m), weight 66 kg, SpO2 96 %. No results found. No results for input(s): WBC, HGB, HCT, PLT in the last 72 hours. No results for input(s): NA, K, CL, CO2, GLUCOSE, BUN, CREATININE, CALCIUM in the last 72 hours.  Intake/Output Summary (Last 24 hours) at 01/30/2021 0614 Last data filed at 01/29/2021 1700 Gross per 24 hour  Intake 747 ml  Output --  Net 747 ml    Physical Exam: BP 132/62 (BP Location: Left Arm)   Pulse 66   Temp 97.8 F (36.6 C) (Oral)   Resp 20   Ht 5' 6.5" (1.689 m)   Wt 66 kg   SpO2 96%   BMI 23.13 kg/m   Gen: no distress, normal appearing HEENT: oral mucosa pink and moist, NCAT Cardio: Reg rate Chest: normal effort, normal rate of breathing Abd: soft, non-distended Ext: no edema Psych: pleasant, normal affect Skin:  Cervical kyphosis, unchanged Neurologic: Alert Motor: 4-4+/5 throughout, right slightly weaker than left, stable + Torticollis, unchanged  Assessment/Plan: 1. Functional deficits which require 3+ hours per day of interdisciplinary therapy in a comprehensive inpatient rehab setting.  Physiatrist is providing close team supervision and 24 hour management of active medical problems listed below.  Physiatrist and rehab team continue to assess barriers to discharge/monitor patient progress toward functional and medical goals   Care Tool:  Bathing    Body parts bathed by patient: Right arm,Left arm,Chest,Abdomen,Front perineal area,Buttocks,Right upper leg,Left upper leg,Face,Right lower leg,Left lower leg         Bathing assist Assist Level: Independent with assistive device      Upper Body Dressing/Undressing Upper body dressing   What is the patient wearing?: Pull over shirt,Bra    Upper body assist Assist Level: Independent    Lower Body Dressing/Undressing Lower body dressing      What is the patient wearing?: Incontinence brief,Pants     Lower body assist Assist for lower body dressing: Independent with assitive device     Toileting Toileting    Toileting assist Assist for toileting: Independent with assistive device     Transfers Chair/bed transfer  Transfers assist  Chair/bed transfer activity did not occur: Safety/medical concerns  Chair/bed transfer assist level: Independent with assistive device Chair/bed transfer assistive device: Programmer, multimedia   Ambulation assist      Assist level: Independent with assistive device Assistive device: Walker-rolling Max distance: 200   Walk 10 feet activity   Assist     Assist level: Independent with assistive device Assistive device: Walker-rolling   Walk 50 feet activity   Assist Walk 50 feet with 2 turns activity did not occur: Safety/medical concerns  Assist level: Independent with assistive device Assistive device: Walker-rolling    Walk 150 feet activity   Assist Walk 150 feet activity did not occur: Safety/medical concerns  Assist level: Independent with assistive device Assistive device: Walker-rolling    Walk 10 feet on uneven surface  activity   Assist Walk 10 feet on uneven surfaces activity did not occur: Safety/medical concerns   Assist level: Supervision/Verbal cueing Assistive device: Walker-platform   Wheelchair     Assist Will patient use wheelchair at discharge?: No  Wheelchair activity did not occur: N/A         Wheelchair 50 feet with 2 turns activity    Assist    Wheelchair 50 feet with 2 turns activity did not occur: N/A       Wheelchair 150 feet activity     Assist  Wheelchair 150 feet activity  did not occur: N/A        Medical Problem List and Plan: 1.Right-sided hemiparesis secondary to left pontine left cerebellum CVA  D/c home 2. Antithrombotics: -DVT/anticoagulation:Continue Coumadin (2.5-3.5) INR therapeutic on 4/13 -antiplatelet therapy: Aspirin 81 mg daily 3. Pain Management:Tylenol as needed 4. Mood:Provide emotional support -antipsychotic agents: N/A 5. Neuropsych: This patientiscapable of making decisions on hisown behalf. 6. Skin/Wound Care:Routine skin checks 7. Fluids/Electrolytes/Nutrition:Routine in and outs  BMP within acceptable range on 4/8 8. UTI.   Urinalysis positive nitrite, Urine culture showing multiple species  Completed course of antibiotics on 4/1 9. Hypertension. ContinueLopressor 25 mg twice daily.  Vitals:   01/29/21 1947 01/30/21 0515  BP: (!) 117/58 132/62  Pulse: 76 66  Resp: 16 20  Temp: 97.7 F (36.5 C) 97.8 F (36.6 C)  SpO2: 96% 96%   10. Hypothyroidism. TSH 0.371. Continue thyroid 90 mg daily 11. Hyperlipidemia. ContinueLipitor 12. History of severe mitral regurgitation status post metal mitral valve replacement 2013. Continue chronic Coumadin. 13. Diastolic congestive heart failure. Monitor for any signs of fluid overload Filed Weights   01/28/21 0438 01/29/21 0458 01/30/21 0515  Weight: 65.4 kg 66.3 kg 66 kg   Weight down 4/14 14. COPD. Check oxygen saturations every shift  No breathing issues on 4/14  To monitor with increased exertion 15. 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 Patient has never been on modifying therapy due to concerns for potential side effects but has not had any reported documented MS flare since diagnosis.  Patient states she urinates every 5 minutes and wears depends at home, which were not available to her, daughter-in-law to bring in  Pure wick nightly  She has tried injections and medications in past without benefit 16.   Paroxysmal Atrial Fibrillation- lopressor, continue Warfarin  Patient states she has had a maze, ablation in the past and notes since that time she has only been in atrial fibrillation 1 time and that was in 2015.  ECG reviewed, showing PVCs 17.  Torticollis with cervical kyphosis  Patient wary of injections, and notes she has grown accustomed to it, is willing to work with therapies to improve range of motion   Discussed with therapies ROM 18.  Slow transit constipation  May take prune juice prn  Improving 19.  Prolonged QTC  Noted on ECG 20.  Elevated BUN  Encourage fluids 21.  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    >30 minutes spent in discharge of patient including review of medications and follow-up appointments, physical examination, and in answering all patient's questions  LOS: 17 days A FACE TO [place] 01/30/2021, 6:14 AM

## 2021-01-31 DIAGNOSIS — I493 Ventricular premature depolarization: Secondary | ICD-10-CM | POA: Diagnosis not present

## 2021-01-31 DIAGNOSIS — I429 Cardiomyopathy, unspecified: Secondary | ICD-10-CM | POA: Diagnosis not present

## 2021-01-31 DIAGNOSIS — R296 Repeated falls: Secondary | ICD-10-CM | POA: Diagnosis not present

## 2021-01-31 DIAGNOSIS — M436 Torticollis: Secondary | ICD-10-CM | POA: Diagnosis not present

## 2021-01-31 DIAGNOSIS — I110 Hypertensive heart disease with heart failure: Secondary | ICD-10-CM | POA: Diagnosis not present

## 2021-01-31 DIAGNOSIS — J439 Emphysema, unspecified: Secondary | ICD-10-CM | POA: Diagnosis not present

## 2021-01-31 DIAGNOSIS — E785 Hyperlipidemia, unspecified: Secondary | ICD-10-CM | POA: Diagnosis not present

## 2021-01-31 DIAGNOSIS — I5030 Unspecified diastolic (congestive) heart failure: Secondary | ICD-10-CM | POA: Diagnosis not present

## 2021-01-31 DIAGNOSIS — I440 Atrioventricular block, first degree: Secondary | ICD-10-CM | POA: Diagnosis not present

## 2021-01-31 DIAGNOSIS — Z9181 History of falling: Secondary | ICD-10-CM | POA: Diagnosis not present

## 2021-01-31 DIAGNOSIS — B009 Herpesviral infection, unspecified: Secondary | ICD-10-CM | POA: Diagnosis not present

## 2021-01-31 DIAGNOSIS — K589 Irritable bowel syndrome without diarrhea: Secondary | ICD-10-CM | POA: Diagnosis not present

## 2021-01-31 DIAGNOSIS — G35 Multiple sclerosis: Secondary | ICD-10-CM | POA: Diagnosis not present

## 2021-01-31 DIAGNOSIS — E039 Hypothyroidism, unspecified: Secondary | ICD-10-CM | POA: Diagnosis not present

## 2021-01-31 DIAGNOSIS — Z7901 Long term (current) use of anticoagulants: Secondary | ICD-10-CM | POA: Diagnosis not present

## 2021-01-31 DIAGNOSIS — F419 Anxiety disorder, unspecified: Secondary | ICD-10-CM | POA: Diagnosis not present

## 2021-01-31 DIAGNOSIS — I272 Pulmonary hypertension, unspecified: Secondary | ICD-10-CM | POA: Diagnosis not present

## 2021-01-31 DIAGNOSIS — I480 Paroxysmal atrial fibrillation: Secondary | ICD-10-CM | POA: Diagnosis not present

## 2021-01-31 DIAGNOSIS — N319 Neuromuscular dysfunction of bladder, unspecified: Secondary | ICD-10-CM | POA: Diagnosis not present

## 2021-01-31 DIAGNOSIS — I69351 Hemiplegia and hemiparesis following cerebral infarction affecting right dominant side: Secondary | ICD-10-CM | POA: Diagnosis not present

## 2021-01-31 DIAGNOSIS — R42 Dizziness and giddiness: Secondary | ICD-10-CM | POA: Diagnosis not present

## 2021-01-31 DIAGNOSIS — M170 Bilateral primary osteoarthritis of knee: Secondary | ICD-10-CM | POA: Diagnosis not present

## 2021-01-31 DIAGNOSIS — K5901 Slow transit constipation: Secondary | ICD-10-CM | POA: Diagnosis not present

## 2021-01-31 DIAGNOSIS — G4700 Insomnia, unspecified: Secondary | ICD-10-CM | POA: Diagnosis not present

## 2021-01-31 DIAGNOSIS — M810 Age-related osteoporosis without current pathological fracture: Secondary | ICD-10-CM | POA: Diagnosis not present

## 2021-02-04 ENCOUNTER — Telehealth: Payer: Self-pay | Admitting: *Deleted

## 2021-02-04 ENCOUNTER — Telehealth: Payer: Self-pay | Admitting: Registered Nurse

## 2021-02-04 DIAGNOSIS — I429 Cardiomyopathy, unspecified: Secondary | ICD-10-CM | POA: Diagnosis not present

## 2021-02-04 DIAGNOSIS — J439 Emphysema, unspecified: Secondary | ICD-10-CM | POA: Diagnosis not present

## 2021-02-04 DIAGNOSIS — I69351 Hemiplegia and hemiparesis following cerebral infarction affecting right dominant side: Secondary | ICD-10-CM | POA: Diagnosis not present

## 2021-02-04 DIAGNOSIS — I110 Hypertensive heart disease with heart failure: Secondary | ICD-10-CM | POA: Diagnosis not present

## 2021-02-04 DIAGNOSIS — G35 Multiple sclerosis: Secondary | ICD-10-CM | POA: Diagnosis not present

## 2021-02-04 DIAGNOSIS — I5030 Unspecified diastolic (congestive) heart failure: Secondary | ICD-10-CM | POA: Diagnosis not present

## 2021-02-04 NOTE — Telephone Encounter (Signed)
Transitional Care call  Patient name: [name] [name] DOB: [birth year] 1. Are you/is patient experiencing any problems since coming home? No a. Are there any questions regarding any aspect of care? No 2. Are there any questions regarding medications administration/dosing? No a. Are meds being taken as prescribed? Yes b. "Patient should review meds with caller to confirm" Medication List Reviewed, 3. Have there been any falls? No 4. Has Home Health been to the house and/or have they contacted you? Yes,  [facility]. a. If not, have you tried to contact them? NA b. Can we help you contact them? NA 5. Are bowels and bladder emptying properly? Yes a. Are there any unexpected incontinence issues? No b. If applicable, is patient following bowel/bladder program? NA 6. Any fevers, problems with breathing, unexpected pain? No 7. Are there any skin problems or new areas of breakdown? No 8. Has the patient/family member arranged specialty MD follow up (ie cardiology/neurology/renal/surgical/etc.)?  Ms. Cho will call her PCP to schedule HFU appointment. This provider called [facility], they will give her a call.  a. Can we help arrange? See above 9. Does the patient need any other services or support that we can help arrange? No 10. Are caregivers following through as expected in assisting the patient? Ms. Gwen Her is self-care receiving assistance from daughter in law and neighbors.  11. Has the patient quit smoking, drinking alcohol, or using drugs as recommended? (                        )  Appointment date/time 02/06/2021  arrival time 2:15 for 2:30 appointment with Bayard Hugger ANP-C. At  [facility]

## 2021-02-04 NOTE — Telephone Encounter (Signed)
Erlene Quan OT [facility] called for approval for OT POC 1 wk 6.  Approval given.

## 2021-02-04 NOTE — Telephone Encounter (Signed)
Cecelia PT [facility] called for PT POC 1wk1, 2wk6, 1wk2.  Approval given.

## 2021-02-05 DIAGNOSIS — G459 Transient cerebral ischemic attack, unspecified: Secondary | ICD-10-CM | POA: Diagnosis not present

## 2021-02-06 ENCOUNTER — Encounter: Payer: Self-pay | Admitting: Registered Nurse

## 2021-02-06 ENCOUNTER — Other Ambulatory Visit: Payer: Self-pay

## 2021-02-06 ENCOUNTER — Ambulatory Visit (INDEPENDENT_AMBULATORY_CARE_PROVIDER_SITE_OTHER): Payer: Medicare Other | Admitting: *Deleted

## 2021-02-06 ENCOUNTER — Encounter: Payer: Medicare Other | Attending: Registered Nurse | Admitting: Registered Nurse

## 2021-02-06 VITALS — BP 116/68 | HR 65 | Temp 98.1°F | Ht 66.0 in | Wt 146.4 lb

## 2021-02-06 DIAGNOSIS — Z952 Presence of prosthetic heart valve: Secondary | ICD-10-CM

## 2021-02-06 DIAGNOSIS — E038 Other specified hypothyroidism: Secondary | ICD-10-CM | POA: Diagnosis not present

## 2021-02-06 DIAGNOSIS — I5030 Unspecified diastolic (congestive) heart failure: Secondary | ICD-10-CM | POA: Diagnosis not present

## 2021-02-06 DIAGNOSIS — I110 Hypertensive heart disease with heart failure: Secondary | ICD-10-CM | POA: Diagnosis not present

## 2021-02-06 DIAGNOSIS — I480 Paroxysmal atrial fibrillation: Secondary | ICD-10-CM | POA: Diagnosis not present

## 2021-02-06 DIAGNOSIS — I6389 Other cerebral infarction: Secondary | ICD-10-CM | POA: Diagnosis not present

## 2021-02-06 DIAGNOSIS — Z5181 Encounter for therapeutic drug level monitoring: Secondary | ICD-10-CM | POA: Diagnosis not present

## 2021-02-06 DIAGNOSIS — I10 Essential (primary) hypertension: Secondary | ICD-10-CM

## 2021-02-06 DIAGNOSIS — Z9889 Other specified postprocedural states: Secondary | ICD-10-CM

## 2021-02-06 DIAGNOSIS — Z8679 Personal history of other diseases of the circulatory system: Secondary | ICD-10-CM | POA: Diagnosis not present

## 2021-02-06 DIAGNOSIS — I69351 Hemiplegia and hemiparesis following cerebral infarction affecting right dominant side: Secondary | ICD-10-CM | POA: Diagnosis not present

## 2021-02-06 DIAGNOSIS — I429 Cardiomyopathy, unspecified: Secondary | ICD-10-CM | POA: Diagnosis not present

## 2021-02-06 DIAGNOSIS — J439 Emphysema, unspecified: Secondary | ICD-10-CM | POA: Diagnosis not present

## 2021-02-06 DIAGNOSIS — G35 Multiple sclerosis: Secondary | ICD-10-CM | POA: Diagnosis not present

## 2021-02-06 LAB — POCT INR: INR: 5.8 — AB (ref 2.0–3.0)

## 2021-02-06 NOTE — Progress Notes (Signed)
Subjective:    Patient ID: [name] [name], female    DOB: January 10, 1951, 70 y.o.   MRN: [id number]  HPI: [name] [name] is a 70 y.o. female who is  here for Transitional Care Visit for follow up of her Brainstem Infarct, PAF, Essential Hypertension and Hypothyroidism. Ms. Lura Em was brought to Emergency room on 01/11/2021 via EMS, with complaints of acte right sided weakness and aphasia. Neurology Consulted.  CT Head WO Contrast: CTA Head: W or WO Contrast IMPRESSION: There is no acute intracranial hemorrhage or evidence of acute Infarction  MR Brain W WO Contrast:  ADDENDUM: There is a wedge-shaped area of diffusion hyperintensity at the left paramedian pons without corresponding T2 hyperintensity suspicious for acute perforator infarct.  MR Cervical Spine:  IMPRESSION: 1. No cord signal abnormality or impingement. 2. Noncompressive degenerative changes are described above. Ms. Dewing was maintained on coumadin as prior to admission.   Ms. Madore was admitted to inpatient rehabilitation on 01/13/2021 and discharged home on 01/30/2021. She is receiving home ealth therapy with [facility]. She denies pain at this time. She rated her pain 2 on Health and History. Also reports she has a good appetite.    Pain Inventory Average Pain 2 Pain Right Now 2 My pain is dull  LOCATION OF PAIN back pain  BOWEL Number of stools per week: 7 Oral laxative use No  Type of laxative  Enema or suppository use No  History of colostomy No  Incontinent No   BLADDER Pads In and out cath, frequency not sure Able to self cath not sure Bladder incontinence Yes  Frequent urination No  Leakage with coughing No  Difficulty starting stream No  Incomplete bladder emptying No    Mobility walk with assistance use a cane use a walker ability to climb steps?  yes do you drive?  yes Do you have any goals in this area?  yes  Function retired Do you have any goals in this area?   no  Neuro/Psych bladder control problems  Prior Studies n/a  Physicians involved in your care n/a   Family History  Problem Relation Age of Onset  . Heart disease Father        cardiac arrest   . CAD Father   . Hypertension Father   . CAD Mother        5 stents and numerous bypass surgery  . Hypertension Mother   . Hyperlipidemia Mother   . CAD Other   . Breast cancer Maternal Grandmother   . Breast cancer Maternal Aunt    Social History   Socioeconomic History  . Marital status: Widowed    Spouse name: Not on file  . Number of children: 2  . Years of education: 52  . Highest education level: Not on file  Occupational History  . Occupation: Retired  Tobacco Use  . Smoking status: Never Smoker  . Smokeless tobacco: Never Used  Vaping Use  . Vaping Use: Never used  Substance and Sexual Activity  . Alcohol use: No  . Drug use: No  . Sexual activity: Never    Birth control/protection: None  Other Topics Concern  . Not on file  Social History Narrative   Patient is a widower (since 30), he currently lives with her son and daughter-in-law. She has 2 children.   She is college educated, and retired from the [employer].   She uses herbal remedies, and multiple over-the-counter supplements. She takes a daily vitamin.  She wears her seatbelt, exercises routinely, smoke detector in the home.   Requires a walker or wheelchair at times.   Feels safe in her relationships.   Social Determinants of Health   Financial Resource Strain: Not on file  Food Insecurity: Not on file  Transportation Needs: Not on file  Physical Activity: Not on file  Stress: Not on file  Social Connections: Not on file   Past Surgical History:  Procedure Laterality Date  . BREAST LUMPECTOMY Right 1999   with sent.node, and axillary dissection (20)  . CHEST TUBE INSERTION  01/26/2012   Procedure: CHEST TUBE INSERTION;  Surgeon: Rexene Alberts, MD;  Location: [facility];  Service: Open  Heart Surgery;  Laterality: Left;  . COLONOSCOPY  2010   "normal"  . CYSTOSCOPY  1992  . LAPAROSCOPIC OVARIAN CYSTECTOMY  1978   urethral stricture repair  . LEFT AND RIGHT HEART CATHETERIZATION WITH CORONARY ANGIOGRAM N/A 01/20/2012   Procedure: LEFT AND RIGHT HEART CATHETERIZATION WITH CORONARY ANGIOGRAM;  Surgeon: Burnell Blanks, MD;  Location: [facility] CATH LAB;  Service: Cardiovascular;  Laterality: N/A;  . LYMPHADENECTOMY    . MAZE  01/26/2012   Procedure: MAZE;  Surgeon: Rexene Alberts, MD;  Location: [facility];  Service: Open Heart Surgery;  Laterality: N/A;  . MITRAL VALVE REPLACEMENT  01/26/2012   Procedure: MINIMALLY INVASIVE MITRAL VALVE (MV) REPLACEMENT;  Surgeon: Rexene Alberts, MD;  Location: [facility];  Service: Open Heart Surgery;  Laterality: Right;  . TEE WITHOUT CARDIOVERSION  01/19/2012   Procedure: TRANSESOPHAGEAL ECHOCARDIOGRAM (TEE);  Surgeon: Peter M Martinique, MD;  Location: [facility] ENDOSCOPY;  Service: Cardiovascular;  Laterality: N/A;  . TONSILLECTOMY  1970  . [place]  . WRIST SURGERY Right 2012   Past Medical History:  Diagnosis Date  . Anxiety   . Arthritis    knees  . Breast cancer ([place]) 1999  . CHF (congestive heart failure) ([place])    Related to severe mitral regurgitation, April, 2013  . 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 Assess by pulmonary team in the hospital April, 2013  . Ejection fraction    EF 60%, echo, April, 2013, with severe MR before mitral valve replacement  . Herpes   . Hypothyroidism   . IBS (irritable bowel syndrome)   . Mitral valve regurgitation    Mitral valve replacement April, 2013, Mitral valve prolapse  . Multiple sclerosis ([place])   . Neurogenic bladder   . Osteoporosis   . Ovarian cyst   . Paroxysmal atrial fibrillation (HCC)    Rapid atrial fibrillation in-hospital, Rapid cardioversion,  before mitral valve surgery  . Pulmonary hypertension ([place])    Echo, April, 2013, before  mitral valve surgery  . S/P Maze operation for atrial fibrillation 01/26/2012   Complete biatrial lesion set using cryothermy via right mini thoracotomy  . S/P mitral valve replacement 01/26/2012   66mm Sorin [employer]omed[employer] Optiform mechanical prosthesis via right mini thoracotomy  . Warfarin anticoagulation    Mechanical mitral prosthesis, April, 20136   BP 116/68   Pulse 65   Temp 98.1 F (36.7 C)   Ht 5\' 6"  (1.676 m)   Wt 146 lb 6.4 oz (66.4 kg)   SpO2 96%   BMI 23.63 kg/m   Opioid Risk Score:   Fall Risk Score:  `1  Depression screen PHQ 2/9  Depression screen [facility] 2/9 02/06/2021 12/18/2019 03/28/2019 03/15/2018 09/18/2016  Decreased Interest 0 0 0 0  0  Down, Depressed, Hopeless 0 0 0 0 0  PHQ - 2 Score 0 0 0 0 0  Tired, decreased energy 1 - - - -  Change in appetite 0 - - - -  Feeling bad or failure about yourself  0 - - - -  Trouble concentrating 0 - - - -  Moving slowly or fidgety/restless 0 - - - -  Suicidal thoughts 0 - - - -  Difficult doing work/chores Not difficult at all - - - -  Some recent data might be hidden      Review of Systems  Constitutional:       Low blood sugar  HENT: Negative.   Eyes: Negative.   Respiratory: Negative.   Cardiovascular: Negative.   Gastrointestinal: Negative.   Endocrine: Negative.   Genitourinary: Negative.   Musculoskeletal: Positive for back pain.  Skin: Negative.   Allergic/Immunologic: Negative.   Neurological: Negative.   Hematological: Negative.   Psychiatric/Behavioral: Negative.        Objective:   Physical Exam Vitals and nursing note reviewed.  Constitutional:      Appearance: Normal appearance.  Cardiovascular:     Rate and Rhythm: Normal rate and regular rhythm.     Pulses: Normal pulses.     Heart sounds: Normal heart sounds.  Pulmonary:     Effort: Pulmonary effort is normal.     Breath sounds: Normal breath sounds.  Musculoskeletal:     Cervical back: Normal range of motion and neck supple.      Comments: Normal Muscle Bulk and Muscle Testing Reveals:  Upper Extremities: Full ROM and Muscle Strength 5/5  Lower Extremities: Full ROM and Muscle Strength 5/5 Arises from Table slowly using walker for support Narrow Based  Gait   Skin:    General: Skin is warm and dry.  Neurological:     Mental Status: She is alert and oriented to person, place, and time.  Psychiatric:        Mood and Affect: Mood normal.        Behavior: Behavior normal.           Assessment & Plan:  1. Brainstem Infarct: Continue Home Health Therapy with [facility]. She has an appointment with Neurology.  2.PAF: Continue Current medication regimen. Cardiology Following.  3.Essential Hypertension: Continue current medication regimen. PCP and Cardiology Following. Continue to monitor.  4. Hypothyroidism: Continue current medication regimen. PCP following. Continue to monitor.   Ms. Alpert states she will F/U with her Neurologist Dr Tomi Likens. She doesn't want to come for a F/U appointment in our office. Also reports everything went well at the hospital, but she has too many F/U appointments with other physicians.

## 2021-02-06 NOTE — Patient Instructions (Addendum)
Description    Hold warfarin today and tomorrow, then start taking warfarin 1 tablet daily except for 1.5 tablets on Tuesdays. Recheck INR in 1 week. Be consistent with your 2 servings of vitamin K foods a week. [place] a serving of vitamin K tonight. Coumadin Clinic [phone].

## 2021-02-08 DIAGNOSIS — G459 Transient cerebral ischemic attack, unspecified: Secondary | ICD-10-CM | POA: Diagnosis not present

## 2021-02-10 DIAGNOSIS — I110 Hypertensive heart disease with heart failure: Secondary | ICD-10-CM | POA: Diagnosis not present

## 2021-02-10 DIAGNOSIS — I69351 Hemiplegia and hemiparesis following cerebral infarction affecting right dominant side: Secondary | ICD-10-CM | POA: Diagnosis not present

## 2021-02-10 DIAGNOSIS — G35 Multiple sclerosis: Secondary | ICD-10-CM | POA: Diagnosis not present

## 2021-02-10 DIAGNOSIS — I429 Cardiomyopathy, unspecified: Secondary | ICD-10-CM | POA: Diagnosis not present

## 2021-02-10 DIAGNOSIS — I5030 Unspecified diastolic (congestive) heart failure: Secondary | ICD-10-CM | POA: Diagnosis not present

## 2021-02-10 DIAGNOSIS — J439 Emphysema, unspecified: Secondary | ICD-10-CM | POA: Diagnosis not present

## 2021-02-12 ENCOUNTER — Encounter: Payer: Medicare Other | Admitting: Registered Nurse

## 2021-02-12 ENCOUNTER — Telehealth: Payer: Self-pay | Admitting: Cardiology

## 2021-02-12 MED ORDER — ROSUVASTATIN CALCIUM 40 MG PO TABS: 40.0000 mg | ORAL_TABLET | Freq: Every day | ORAL | 1 refills | Status: DC

## 2021-02-12 NOTE — Telephone Encounter (Signed)
Spoke with the pt and endorsed to her recommendations per our Pharmacist, Megan Supple.  Advised the pt to stop taking her atorvastatin, and we will switch her to start taking Rosuvastatin 40 mg po daily, for it has better tolerability, and let us know if the aches continue, for we can reduce this to 20 mg po daily and refer her to our lipid clinic for further management. Updated atorvastatin in the pts allergies as an intolerance. Confirmed the pharmacy of choice with the pt. Pt verbalized understanding and agrees with this plan.

## 2021-02-12 NOTE — Telephone Encounter (Signed)
Pt was started on lipitor 80 mg po daily when she was recently admitted to the hospital on 01/29/21. Pt is now calling our office to inform us that since starting this medication, she is having side effects, with complaints of stiff body, aching to her knees and ankles, and feeling more fatigued since being on this regimen.  Pt is a former Dr. Meda Coffee pt.  She will establish with Dr. Johney Frame in July.  Pt is asking what to do for this issue, should she stop taking, or is there an alternative regimen she can take in place of the lipitor. Will forward these concerns to Dr. Johney Frame and Pharmacy, to further review and advise on this matter.  Will follow-up with the pt accordingly thereafter.

## 2021-02-12 NOTE — Telephone Encounter (Signed)
Pt c/o medication issue:  1. Name of Medication: Lipitor  2. How are you currently taking this medication (dosage and times per day)1 time a day  3. Are you having a reaction (difficulty breathing--STAT)?  4. What is your medication issue? Entire body is stiff, knees and ankles are hurtin, also tired, more so than normal

## 2021-02-12 NOTE — Telephone Encounter (Signed)
LDL 131, pt admitted with CVA and should be on high intensity statin therapy if possible. Recommend changing from atorvastatin to rosuvastatin due to better tolerability. Would start rosuvastatin 40mg  daily. If her aches continue, could decrease down to 20mg /follow up with lipid clinic.

## 2021-02-13 DIAGNOSIS — I5030 Unspecified diastolic (congestive) heart failure: Secondary | ICD-10-CM | POA: Diagnosis not present

## 2021-02-13 DIAGNOSIS — I110 Hypertensive heart disease with heart failure: Secondary | ICD-10-CM | POA: Diagnosis not present

## 2021-02-13 DIAGNOSIS — J439 Emphysema, unspecified: Secondary | ICD-10-CM | POA: Diagnosis not present

## 2021-02-13 DIAGNOSIS — I429 Cardiomyopathy, unspecified: Secondary | ICD-10-CM | POA: Diagnosis not present

## 2021-02-13 DIAGNOSIS — G35 Multiple sclerosis: Secondary | ICD-10-CM | POA: Diagnosis not present

## 2021-02-13 DIAGNOSIS — I69351 Hemiplegia and hemiparesis following cerebral infarction affecting right dominant side: Secondary | ICD-10-CM | POA: Diagnosis not present

## 2021-02-14 DIAGNOSIS — I69351 Hemiplegia and hemiparesis following cerebral infarction affecting right dominant side: Secondary | ICD-10-CM | POA: Diagnosis not present

## 2021-02-14 DIAGNOSIS — J439 Emphysema, unspecified: Secondary | ICD-10-CM | POA: Diagnosis not present

## 2021-02-14 DIAGNOSIS — I110 Hypertensive heart disease with heart failure: Secondary | ICD-10-CM | POA: Diagnosis not present

## 2021-02-14 DIAGNOSIS — G35 Multiple sclerosis: Secondary | ICD-10-CM | POA: Diagnosis not present

## 2021-02-14 DIAGNOSIS — I5030 Unspecified diastolic (congestive) heart failure: Secondary | ICD-10-CM | POA: Diagnosis not present

## 2021-02-14 DIAGNOSIS — I429 Cardiomyopathy, unspecified: Secondary | ICD-10-CM | POA: Diagnosis not present

## 2021-02-17 ENCOUNTER — Other Ambulatory Visit: Payer: Self-pay

## 2021-02-17 ENCOUNTER — Ambulatory Visit (INDEPENDENT_AMBULATORY_CARE_PROVIDER_SITE_OTHER): Payer: Medicare Other | Admitting: *Deleted

## 2021-02-17 DIAGNOSIS — Z8679 Personal history of other diseases of the circulatory system: Secondary | ICD-10-CM

## 2021-02-17 DIAGNOSIS — Z9889 Other specified postprocedural states: Secondary | ICD-10-CM

## 2021-02-17 DIAGNOSIS — I69351 Hemiplegia and hemiparesis following cerebral infarction affecting right dominant side: Secondary | ICD-10-CM | POA: Diagnosis not present

## 2021-02-17 DIAGNOSIS — Z952 Presence of prosthetic heart valve: Secondary | ICD-10-CM

## 2021-02-17 DIAGNOSIS — Z5181 Encounter for therapeutic drug level monitoring: Secondary | ICD-10-CM

## 2021-02-17 DIAGNOSIS — I429 Cardiomyopathy, unspecified: Secondary | ICD-10-CM | POA: Diagnosis not present

## 2021-02-17 DIAGNOSIS — I5030 Unspecified diastolic (congestive) heart failure: Secondary | ICD-10-CM | POA: Diagnosis not present

## 2021-02-17 DIAGNOSIS — J439 Emphysema, unspecified: Secondary | ICD-10-CM | POA: Diagnosis not present

## 2021-02-17 DIAGNOSIS — I110 Hypertensive heart disease with heart failure: Secondary | ICD-10-CM | POA: Diagnosis not present

## 2021-02-17 DIAGNOSIS — G35 Multiple sclerosis: Secondary | ICD-10-CM | POA: Diagnosis not present

## 2021-02-17 LAB — POCT INR: INR: 2.7 (ref 2.0–3.0)

## 2021-02-17 NOTE — Patient Instructions (Signed)
Description   Continue taking warfarin 1 tablet daily except for 1.5 tablets on Tuesdays. Recheck INR in 2 week. Be consistent with your vitamin K foods. Coumadin Clinic [phone].

## 2021-02-18 DIAGNOSIS — J439 Emphysema, unspecified: Secondary | ICD-10-CM | POA: Diagnosis not present

## 2021-02-18 DIAGNOSIS — I110 Hypertensive heart disease with heart failure: Secondary | ICD-10-CM | POA: Diagnosis not present

## 2021-02-18 DIAGNOSIS — I69351 Hemiplegia and hemiparesis following cerebral infarction affecting right dominant side: Secondary | ICD-10-CM | POA: Diagnosis not present

## 2021-02-18 DIAGNOSIS — I429 Cardiomyopathy, unspecified: Secondary | ICD-10-CM | POA: Diagnosis not present

## 2021-02-18 DIAGNOSIS — G35 Multiple sclerosis: Secondary | ICD-10-CM | POA: Diagnosis not present

## 2021-02-18 DIAGNOSIS — I5030 Unspecified diastolic (congestive) heart failure: Secondary | ICD-10-CM | POA: Diagnosis not present

## 2021-02-18 NOTE — Progress Notes (Signed)
NEUROLOGY FOLLOW UP OFFICE NOTE  [name] [name] [id number]  Assessment/Plan:   1.  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 2.  Multiple sclerosis, stable 3.  HTN 4.  HLD  1.  Secondary stroke prevention as managed by PCP and cardiology - Coumadin - Statin - LDL goal less than 70 - Glycemic control.  Hgb A1c goal less than 7 -  Normotensive blood pressure 2.  D3 4000 IU daily 3.  Continue PT/OT 4.  Follow up 6 months.  Subjective:  [name] [name] a9 year old right-handed Caucasian woman with Multiple Sclerosis, COPD, CHF, mitral valve replacement, hypothyroidism and osteoporosis who follows up for recent stroke.  UPDATE: Current DMT: None.  Other medications/supplements:  D3 4000 IU daily, biotin 5065mcg daily.  She was admitted to [facility] on 01/11/2021 for right sided weakness, dizziness, slurred/slowed speech.  Not tPA candidate as on Coumadin  MRI of brain personally reviewed showed acute perforator infarct at the left paramedian pons and left cerebellum.  CTA of head and neck personally reviewed showed age-indeterminate but likely chronic high-grade stenosis/occlusion of proximal right vertebral artery and patent intracranial arteries.  Received vestibular rehab in hospital.  While dizziness mostly decondary to stroke, did have small component of BPPV.  Echocardiogram from previous July 2021 reviewed.  Advised by neurology to continue Coumadin, Lipitor 80mg  and normotensive blood pressure control.   Currently getting home PT/OT.  Lipitor caused myalgias/polyarthralgias, so she was switched to Crestor.    HISTORY: She was diagnosed with multiple sclerosis in 1994. At the time, she exhibited tingling in her hands as well as frequent falls. She saw a neurologist who performed an MRI of the brain that revealed white matter lesions. She underwent a lumbar puncture which reportedly demonstrated possible  elevated IgG index. It was recommended to start Avonex, but she declined, fearing potential side effects. She has never been on disease modifying therapy. Mercury toxicity  She has not had any clear MS flares. She has chronic symptoms, such as falls, neurogenic bladder and occasional tingling in the left hand. She used to have muscle spasms in the right arm many years ago, which has resolved. Over the past several months, she has started to use a rolling walker due to increased gait difficulty which she attributes to her MS-related chronic gait instability, as well as arthritis and scoliosis. She currently is undergoing physical therapy and sees a Physiological scientist once a week. She has a urologist.  She has never had optic neuritis or focal/unilateral weakness.   Imaging: MRI of brain without contrast was performed on 07/11/14 revealed supratentorial white matter lesions consistent with demyelinating disease as well as diffuse cortical atrophy.  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 She has sustained L2 and L5 compression fractures with height loss in the past due to her falls.  MRI of brain without contrast from 02/18/17 was personally reviewed and was stable compared to prior imaging from 07/11/14. It revealed multiple lesions in the periventricular and juxta cortical white matter, as well as within the right brachium pontis. MRI of cervical spine revealed thin appearance of cervical cord without demyelination.  MRI of brain without contrast from 12/15/2019 was stable compared to prior imaging from 02/18/2017.  PAST MEDICAL HISTORY: Past Medical History:  Diagnosis Date  . Anxiety   . Arthritis    knees  .  Breast cancer ([place]) 1999  . CHF (congestive heart failure) ([place])    Related to severe mitral regurgitation, April, 2013  . COPD  (chronic obstructive pulmonary disease) (HCC)    COPD with emphysema.. Assess by pulmonary team in the hospital April, 2013  . Ejection fraction    EF 60%, echo, April, 2013, with severe MR before mitral valve replacement  . Herpes   . Hypothyroidism   . IBS (irritable bowel syndrome)   . Mitral valve regurgitation    Mitral valve replacement April, 2013, Mitral valve prolapse  . Multiple sclerosis ([place])   . Neurogenic bladder   . Osteoporosis   . Ovarian cyst   . Paroxysmal atrial fibrillation (HCC)    Rapid atrial fibrillation in-hospital, Rapid cardioversion,  before mitral valve surgery  . Pulmonary hypertension ([place])    Echo, April, 2013, before mitral valve surgery  . S/P Maze operation for atrial fibrillation 01/26/2012   Complete biatrial lesion set using cryothermy via right mini thoracotomy  . S/P mitral valve replacement 01/26/2012   85mm Sorin Carbomedics Optiform mechanical prosthesis via right mini thoracotomy  . Warfarin anticoagulation    Mechanical mitral prosthesis, April, 20136    MEDICATIONS: Current Outpatient Medications on File Prior to Visit  Medication Sig Dispense Refill  . acetaminophen (TYLENOL) 325 MG tablet Take 2 tablets (650 mg total) by mouth every 4 (four) hours as needed for mild pain (or temp > 37.5 C (99.5 F)).    Francia Greaves THYROID 90 MG tablet Take 1 tablet (90 mg total) by mouth daily. 30 tablet 0  . Biotin 5000 MCG CAPS Take 10,000 mcg by mouth in the morning and at bedtime.    . Black Cohosh 40 MG CAPS Take 40 mg by mouth every evening.    . Bromelains (BROMELAIN PO) Take 1 capsule by mouth 2 (two) times daily.    . calcium carbonate (OSCAL) 1500 (600 Ca) MG TABS tablet Take 1,500 mg by mouth daily. Takes 1 time daily    . Cholecalciferol (VITAMIN D3) 2000 units capsule Take 4,000 Units by mouth daily.    . Coenzyme Q10 (CO Q-10) 100 MG CAPS Take 1 capsule by mouth daily.     . Cranberry 500 MG CAPS Take 1 capsule by mouth daily.    Marland Kitchen  L-THEANINE PO Take 1 capsule by mouth daily.     Marland Kitchen lactose free nutrition (BOOST PLUS) LIQD Take 237 mLs by mouth daily.     Marland Kitchen lidocaine-prilocaine (EMLA) cream Apply 1 application topically as needed (per pt this is for use before blood draws).   0  . lisinopril (ZESTRIL) 5 MG tablet Take 1 tablet (5 mg total) by mouth daily. 90 tablet 1  . Lysine 500 MG CAPS Take 1 capsule by mouth daily.    . Magnesium Citrate 200 MG TABS Take 200 mg by mouth daily.    . meclizine (ANTIVERT) 12.5 MG tablet Take 1 tablet (12.5 mg total) by mouth 2 (two) times daily as needed for dizziness. 30 tablet 0  . metoprolol tartrate (LOPRESSOR) 25 MG tablet TAKE 1 TAB (25 MG) PO TWICE DAILY - pt must make appt with provider for further refills - 1st attempt 180 tablet 3  . MILK THISTLE PO Take 1 capsule by mouth daily.    . Misc Natural Products (GLUCOSAMINE CHOND COMPLEX/MSM PO) Take 1 tablet by mouth in the morning and at bedtime. Strength of dose Glucosamine 1500mg  /Chodroitron 1000mg / MSM 500mg  takes one twice daily    .  Olive Leaf 500 MG CAPS Take 1 capsule by mouth 2 (two) times daily.     . Petasin (PETADOLEX PO) Take 1 tablet by mouth 2 (two) times daily.    . Probiotic Product (PROBIOTIC DAILY PO) Take 1 capsule by mouth daily. Take 1 capsule once a day    . PROGESTERONE MICRONIZED PO Take 100 mg by mouth daily.     . Pumpkin Seed 500 MG TABS Take 1 tablet by mouth 2 (two) times daily.    . rosuvastatin (CRESTOR) 40 MG tablet Take 1 tablet (40 mg total) by mouth daily. 90 tablet 1  . TURMERIC PO Take 1 capsule by mouth daily.    . vitamin A 10000 UNIT capsule Take 10,000 Units by mouth daily.    . vitamin B-12 (CYANOCOBALAMIN) 1000 MCG tablet Take 1,000 mcg by mouth daily. Taking sublingal    . vitamin E 400 UNIT capsule Take 400 Units by mouth daily.    Marland Kitchen warfarin (COUMADIN) 5 MG tablet Take 1 tablet (5 mg total) by mouth daily. 30 tablet 11   No current facility-administered medications on file prior to  visit.    ALLERGIES: Allergies  Allergen Reactions  . Erythromycin Nausea And Vomiting  . Atorvastatin Other (See Comments)    Pt reports causes body to be stiff, joints ache, aching in knees and ankles, feels more fatigued.   . Valsartan Other (See Comments)    Pt reports caused her depression    FAMILY HISTORY: Family History  Problem Relation Age of Onset  . Heart disease Father        cardiac arrest   . CAD Father   . Hypertension Father   . CAD Mother        5 stents and numerous bypass surgery  . Hypertension Mother   . Hyperlipidemia Mother   . CAD Other   . Breast cancer Maternal Grandmother   . Breast cancer Maternal Aunt       Objective:  Blood pressure 138/65, pulse 64, height 5\' 7"  (1.702 m), weight 147 lb 3.2 oz (66.8 kg), SpO2 96 %. General: No acute distress.  Patient appears well-groomed.   Head:  Normocephalic/atraumatic Eyes:  Fundi examined but not visualized Neck: supple, no paraspinal tenderness, full range of motion Heart:  Regular rate and rhythm Lungs:  Clear to auscultation bilaterally Back: No paraspinal tenderness Neurological Exam: alert and oriented to person, place, and time;  Speech fluent and not dysarthric, language intact.  CN II-XII intact. Decreased bulk in all extremities, normal tone; Muscle strength 4+/5 bilateral hip flexion, 5-/5 knee extension/flexion, otherwise muscle strength 5/5 throughout. Sensation to light touch intact. Deep tendon reflexes 2+ throughout, bilateral Babinski. Finger-to-nose testing intact. Wide-based shuffling gait.  Ambulates with walker.    Metta Clines, DO  CC: Howard Pouch, DO

## 2021-02-19 ENCOUNTER — Encounter: Payer: Self-pay | Admitting: Neurology

## 2021-02-19 ENCOUNTER — Ambulatory Visit (INDEPENDENT_AMBULATORY_CARE_PROVIDER_SITE_OTHER): Payer: Medicare Other | Admitting: Neurology

## 2021-02-19 ENCOUNTER — Other Ambulatory Visit: Payer: Self-pay

## 2021-02-19 VITALS — BP 138/65 | HR 64 | Ht 67.0 in | Wt 147.2 lb

## 2021-02-19 DIAGNOSIS — I63212 Cerebral infarction due to unspecified occlusion or stenosis of left vertebral arteries: Secondary | ICD-10-CM | POA: Diagnosis not present

## 2021-02-19 DIAGNOSIS — I6389 Other cerebral infarction: Secondary | ICD-10-CM

## 2021-02-19 DIAGNOSIS — G35 Multiple sclerosis: Secondary | ICD-10-CM

## 2021-02-19 DIAGNOSIS — E785 Hyperlipidemia, unspecified: Secondary | ICD-10-CM

## 2021-02-19 DIAGNOSIS — I10 Essential (primary) hypertension: Secondary | ICD-10-CM | POA: Diagnosis not present

## 2021-02-19 NOTE — Patient Instructions (Signed)
1.  Continue Coumadin 2.  Continue rosuvastatin 3.  Blood pressure control 4.  Continue physical therapy and occupational therapy 5.  Follow up 6 months.

## 2021-02-20 DIAGNOSIS — J439 Emphysema, unspecified: Secondary | ICD-10-CM | POA: Diagnosis not present

## 2021-02-20 DIAGNOSIS — I429 Cardiomyopathy, unspecified: Secondary | ICD-10-CM | POA: Diagnosis not present

## 2021-02-20 DIAGNOSIS — G35 Multiple sclerosis: Secondary | ICD-10-CM | POA: Diagnosis not present

## 2021-02-20 DIAGNOSIS — I69351 Hemiplegia and hemiparesis following cerebral infarction affecting right dominant side: Secondary | ICD-10-CM | POA: Diagnosis not present

## 2021-02-20 DIAGNOSIS — I5030 Unspecified diastolic (congestive) heart failure: Secondary | ICD-10-CM | POA: Diagnosis not present

## 2021-02-20 DIAGNOSIS — I110 Hypertensive heart disease with heart failure: Secondary | ICD-10-CM | POA: Diagnosis not present

## 2021-02-24 DIAGNOSIS — J439 Emphysema, unspecified: Secondary | ICD-10-CM | POA: Diagnosis not present

## 2021-02-24 DIAGNOSIS — I110 Hypertensive heart disease with heart failure: Secondary | ICD-10-CM | POA: Diagnosis not present

## 2021-02-24 DIAGNOSIS — G35 Multiple sclerosis: Secondary | ICD-10-CM | POA: Diagnosis not present

## 2021-02-24 DIAGNOSIS — I429 Cardiomyopathy, unspecified: Secondary | ICD-10-CM | POA: Diagnosis not present

## 2021-02-24 DIAGNOSIS — I69351 Hemiplegia and hemiparesis following cerebral infarction affecting right dominant side: Secondary | ICD-10-CM | POA: Diagnosis not present

## 2021-02-24 DIAGNOSIS — I5030 Unspecified diastolic (congestive) heart failure: Secondary | ICD-10-CM | POA: Diagnosis not present

## 2021-02-26 DIAGNOSIS — I69351 Hemiplegia and hemiparesis following cerebral infarction affecting right dominant side: Secondary | ICD-10-CM | POA: Diagnosis not present

## 2021-02-26 DIAGNOSIS — I5030 Unspecified diastolic (congestive) heart failure: Secondary | ICD-10-CM | POA: Diagnosis not present

## 2021-02-26 DIAGNOSIS — G35 Multiple sclerosis: Secondary | ICD-10-CM | POA: Diagnosis not present

## 2021-02-26 DIAGNOSIS — G459 Transient cerebral ischemic attack, unspecified: Secondary | ICD-10-CM | POA: Diagnosis not present

## 2021-02-26 DIAGNOSIS — I429 Cardiomyopathy, unspecified: Secondary | ICD-10-CM | POA: Diagnosis not present

## 2021-02-26 DIAGNOSIS — I110 Hypertensive heart disease with heart failure: Secondary | ICD-10-CM | POA: Diagnosis not present

## 2021-02-26 DIAGNOSIS — J439 Emphysema, unspecified: Secondary | ICD-10-CM | POA: Diagnosis not present

## 2021-02-27 ENCOUNTER — Telehealth: Payer: Self-pay | Admitting: Cardiology

## 2021-02-27 DIAGNOSIS — Z79899 Other long term (current) drug therapy: Secondary | ICD-10-CM

## 2021-02-27 DIAGNOSIS — I639 Cerebral infarction, unspecified: Secondary | ICD-10-CM

## 2021-02-27 DIAGNOSIS — I110 Hypertensive heart disease with heart failure: Secondary | ICD-10-CM | POA: Diagnosis not present

## 2021-02-27 DIAGNOSIS — I5030 Unspecified diastolic (congestive) heart failure: Secondary | ICD-10-CM | POA: Diagnosis not present

## 2021-02-27 DIAGNOSIS — G35 Multiple sclerosis: Secondary | ICD-10-CM | POA: Diagnosis not present

## 2021-02-27 DIAGNOSIS — Z789 Other specified health status: Secondary | ICD-10-CM

## 2021-02-27 DIAGNOSIS — I69351 Hemiplegia and hemiparesis following cerebral infarction affecting right dominant side: Secondary | ICD-10-CM | POA: Diagnosis not present

## 2021-02-27 DIAGNOSIS — J439 Emphysema, unspecified: Secondary | ICD-10-CM | POA: Diagnosis not present

## 2021-02-27 DIAGNOSIS — I429 Cardiomyopathy, unspecified: Secondary | ICD-10-CM | POA: Diagnosis not present

## 2021-02-27 NOTE — Telephone Encounter (Signed)
Thank you so much! Perfect plan!

## 2021-02-27 NOTE — Telephone Encounter (Signed)
Pt is calling back to report that she is still having joint aches, even with switch in statin to crestor 40 mg po daily (see telephone encounter 4/27 and referenced below).  Pt states she tried reducing crestor and cutting it in half to taking 20 mg po daily, and she still is experiencing joint aches all over.  Informed the pt that being she is intolerant to crestor 40 mg and reduced dose of 20 mg, I will refer her to lipid clinic as planned.  Informed the pt that I will place the referral in the system and send a message to our [place], to call her back and arrange this appt. Pt verbalized understanding and agrees with this plan. Will send this message to Dr. Johney Frame and [facility], as a general FYI.     Leeroy Bock, RPH-CPP     02/12/21 2:50 PM Note LDL 131, pt admitted with CVA and should be on high intensity statin therapy if possible. Recommend changing from atorvastatin to rosuvastatin due to better tolerability. Would start rosuvastatin 40mg  daily. If her aches continue, could decrease down to 20mg /follow up with lipid clinic.

## 2021-02-27 NOTE — Telephone Encounter (Signed)
REFER TO LIPID CLINIC Received: Today [name] [name]  [name] Wyse M, LPN Got her scheduled 03/19/21 @ 2:30pm.    Pt scheduled with lipid clinic for 03/19/21 at 2:30 pm.  Pt made aware of appt date and time by [facility] Scheduler.

## 2021-02-27 NOTE — Telephone Encounter (Signed)
Pt c/o medication issue:  1. Name of Medication: rosuvastatin (CRESTOR) 40 MG tablet  2. How are you currently taking this medication (dosage and times per day)? Take 1 tablet (40 mg total) by mouth daily. 3. Are you having a reaction (difficulty breathing--STAT)? No  4. What is your medication issue? Painful joints, sore, stiff,more frequent headaches  Pt said she was switched from a different med and ever since she's been taking Crestor she has been experiencing these side effects. Pt is a former pt of Dr. Meda Coffee

## 2021-03-02 DIAGNOSIS — Z9181 History of falling: Secondary | ICD-10-CM | POA: Diagnosis not present

## 2021-03-02 DIAGNOSIS — I110 Hypertensive heart disease with heart failure: Secondary | ICD-10-CM | POA: Diagnosis not present

## 2021-03-02 DIAGNOSIS — E785 Hyperlipidemia, unspecified: Secondary | ICD-10-CM | POA: Diagnosis not present

## 2021-03-02 DIAGNOSIS — K589 Irritable bowel syndrome without diarrhea: Secondary | ICD-10-CM | POA: Diagnosis not present

## 2021-03-02 DIAGNOSIS — K5901 Slow transit constipation: Secondary | ICD-10-CM | POA: Diagnosis not present

## 2021-03-02 DIAGNOSIS — I440 Atrioventricular block, first degree: Secondary | ICD-10-CM | POA: Diagnosis not present

## 2021-03-02 DIAGNOSIS — I272 Pulmonary hypertension, unspecified: Secondary | ICD-10-CM | POA: Diagnosis not present

## 2021-03-02 DIAGNOSIS — M170 Bilateral primary osteoarthritis of knee: Secondary | ICD-10-CM | POA: Diagnosis not present

## 2021-03-02 DIAGNOSIS — F419 Anxiety disorder, unspecified: Secondary | ICD-10-CM | POA: Diagnosis not present

## 2021-03-02 DIAGNOSIS — E039 Hypothyroidism, unspecified: Secondary | ICD-10-CM | POA: Diagnosis not present

## 2021-03-02 DIAGNOSIS — I429 Cardiomyopathy, unspecified: Secondary | ICD-10-CM | POA: Diagnosis not present

## 2021-03-02 DIAGNOSIS — I493 Ventricular premature depolarization: Secondary | ICD-10-CM | POA: Diagnosis not present

## 2021-03-02 DIAGNOSIS — M810 Age-related osteoporosis without current pathological fracture: Secondary | ICD-10-CM | POA: Diagnosis not present

## 2021-03-02 DIAGNOSIS — I69351 Hemiplegia and hemiparesis following cerebral infarction affecting right dominant side: Secondary | ICD-10-CM | POA: Diagnosis not present

## 2021-03-02 DIAGNOSIS — M436 Torticollis: Secondary | ICD-10-CM | POA: Diagnosis not present

## 2021-03-02 DIAGNOSIS — J439 Emphysema, unspecified: Secondary | ICD-10-CM | POA: Diagnosis not present

## 2021-03-02 DIAGNOSIS — N319 Neuromuscular dysfunction of bladder, unspecified: Secondary | ICD-10-CM | POA: Diagnosis not present

## 2021-03-02 DIAGNOSIS — G4700 Insomnia, unspecified: Secondary | ICD-10-CM | POA: Diagnosis not present

## 2021-03-02 DIAGNOSIS — Z7901 Long term (current) use of anticoagulants: Secondary | ICD-10-CM | POA: Diagnosis not present

## 2021-03-02 DIAGNOSIS — G35 Multiple sclerosis: Secondary | ICD-10-CM | POA: Diagnosis not present

## 2021-03-02 DIAGNOSIS — B009 Herpesviral infection, unspecified: Secondary | ICD-10-CM | POA: Diagnosis not present

## 2021-03-02 DIAGNOSIS — I5030 Unspecified diastolic (congestive) heart failure: Secondary | ICD-10-CM | POA: Diagnosis not present

## 2021-03-02 DIAGNOSIS — R296 Repeated falls: Secondary | ICD-10-CM | POA: Diagnosis not present

## 2021-03-02 DIAGNOSIS — R42 Dizziness and giddiness: Secondary | ICD-10-CM | POA: Diagnosis not present

## 2021-03-02 DIAGNOSIS — I480 Paroxysmal atrial fibrillation: Secondary | ICD-10-CM | POA: Diagnosis not present

## 2021-03-03 ENCOUNTER — Ambulatory Visit (INDEPENDENT_AMBULATORY_CARE_PROVIDER_SITE_OTHER): Payer: Medicare Other | Admitting: *Deleted

## 2021-03-03 ENCOUNTER — Other Ambulatory Visit: Payer: Self-pay

## 2021-03-03 DIAGNOSIS — Z952 Presence of prosthetic heart valve: Secondary | ICD-10-CM

## 2021-03-03 DIAGNOSIS — I429 Cardiomyopathy, unspecified: Secondary | ICD-10-CM | POA: Diagnosis not present

## 2021-03-03 DIAGNOSIS — Z9889 Other specified postprocedural states: Secondary | ICD-10-CM | POA: Diagnosis not present

## 2021-03-03 DIAGNOSIS — Z8679 Personal history of other diseases of the circulatory system: Secondary | ICD-10-CM

## 2021-03-03 DIAGNOSIS — I5030 Unspecified diastolic (congestive) heart failure: Secondary | ICD-10-CM | POA: Diagnosis not present

## 2021-03-03 DIAGNOSIS — G35 Multiple sclerosis: Secondary | ICD-10-CM | POA: Diagnosis not present

## 2021-03-03 DIAGNOSIS — J439 Emphysema, unspecified: Secondary | ICD-10-CM | POA: Diagnosis not present

## 2021-03-03 DIAGNOSIS — Z5181 Encounter for therapeutic drug level monitoring: Secondary | ICD-10-CM | POA: Diagnosis not present

## 2021-03-03 DIAGNOSIS — I110 Hypertensive heart disease with heart failure: Secondary | ICD-10-CM | POA: Diagnosis not present

## 2021-03-03 DIAGNOSIS — I69351 Hemiplegia and hemiparesis following cerebral infarction affecting right dominant side: Secondary | ICD-10-CM | POA: Diagnosis not present

## 2021-03-03 LAB — POCT INR: INR: 3.3 — AB (ref 2.0–3.0)

## 2021-03-03 NOTE — Patient Instructions (Addendum)
Description   Continue taking warfarin 1 tablet daily except for 1.5 tablets on Tuesdays. Recheck INR in 3 weeks.  Be consistent with your vitamin K foods. Coumadin Clinic [phone].

## 2021-03-05 DIAGNOSIS — I69351 Hemiplegia and hemiparesis following cerebral infarction affecting right dominant side: Secondary | ICD-10-CM | POA: Diagnosis not present

## 2021-03-05 DIAGNOSIS — I429 Cardiomyopathy, unspecified: Secondary | ICD-10-CM | POA: Diagnosis not present

## 2021-03-05 DIAGNOSIS — I5030 Unspecified diastolic (congestive) heart failure: Secondary | ICD-10-CM | POA: Diagnosis not present

## 2021-03-05 DIAGNOSIS — G35 Multiple sclerosis: Secondary | ICD-10-CM | POA: Diagnosis not present

## 2021-03-05 DIAGNOSIS — J439 Emphysema, unspecified: Secondary | ICD-10-CM | POA: Diagnosis not present

## 2021-03-05 DIAGNOSIS — I110 Hypertensive heart disease with heart failure: Secondary | ICD-10-CM | POA: Diagnosis not present

## 2021-03-06 DIAGNOSIS — G459 Transient cerebral ischemic attack, unspecified: Secondary | ICD-10-CM | POA: Diagnosis not present

## 2021-03-07 DIAGNOSIS — I69351 Hemiplegia and hemiparesis following cerebral infarction affecting right dominant side: Secondary | ICD-10-CM | POA: Diagnosis not present

## 2021-03-07 DIAGNOSIS — I5030 Unspecified diastolic (congestive) heart failure: Secondary | ICD-10-CM | POA: Diagnosis not present

## 2021-03-07 DIAGNOSIS — J439 Emphysema, unspecified: Secondary | ICD-10-CM | POA: Diagnosis not present

## 2021-03-07 DIAGNOSIS — I429 Cardiomyopathy, unspecified: Secondary | ICD-10-CM | POA: Diagnosis not present

## 2021-03-07 DIAGNOSIS — I110 Hypertensive heart disease with heart failure: Secondary | ICD-10-CM | POA: Diagnosis not present

## 2021-03-07 DIAGNOSIS — G35 Multiple sclerosis: Secondary | ICD-10-CM | POA: Diagnosis not present

## 2021-03-11 DIAGNOSIS — I429 Cardiomyopathy, unspecified: Secondary | ICD-10-CM | POA: Diagnosis not present

## 2021-03-11 DIAGNOSIS — I69351 Hemiplegia and hemiparesis following cerebral infarction affecting right dominant side: Secondary | ICD-10-CM | POA: Diagnosis not present

## 2021-03-11 DIAGNOSIS — G35 Multiple sclerosis: Secondary | ICD-10-CM | POA: Diagnosis not present

## 2021-03-11 DIAGNOSIS — I5030 Unspecified diastolic (congestive) heart failure: Secondary | ICD-10-CM | POA: Diagnosis not present

## 2021-03-11 DIAGNOSIS — J439 Emphysema, unspecified: Secondary | ICD-10-CM | POA: Diagnosis not present

## 2021-03-11 DIAGNOSIS — I110 Hypertensive heart disease with heart failure: Secondary | ICD-10-CM | POA: Diagnosis not present

## 2021-03-13 DIAGNOSIS — J439 Emphysema, unspecified: Secondary | ICD-10-CM | POA: Diagnosis not present

## 2021-03-13 DIAGNOSIS — I5030 Unspecified diastolic (congestive) heart failure: Secondary | ICD-10-CM | POA: Diagnosis not present

## 2021-03-13 DIAGNOSIS — I110 Hypertensive heart disease with heart failure: Secondary | ICD-10-CM | POA: Diagnosis not present

## 2021-03-13 DIAGNOSIS — G35 Multiple sclerosis: Secondary | ICD-10-CM | POA: Diagnosis not present

## 2021-03-13 DIAGNOSIS — I69351 Hemiplegia and hemiparesis following cerebral infarction affecting right dominant side: Secondary | ICD-10-CM | POA: Diagnosis not present

## 2021-03-13 DIAGNOSIS — I429 Cardiomyopathy, unspecified: Secondary | ICD-10-CM | POA: Diagnosis not present

## 2021-03-18 DIAGNOSIS — I429 Cardiomyopathy, unspecified: Secondary | ICD-10-CM | POA: Diagnosis not present

## 2021-03-18 DIAGNOSIS — G35 Multiple sclerosis: Secondary | ICD-10-CM | POA: Diagnosis not present

## 2021-03-18 DIAGNOSIS — J439 Emphysema, unspecified: Secondary | ICD-10-CM | POA: Diagnosis not present

## 2021-03-18 DIAGNOSIS — I5030 Unspecified diastolic (congestive) heart failure: Secondary | ICD-10-CM | POA: Diagnosis not present

## 2021-03-18 DIAGNOSIS — I69351 Hemiplegia and hemiparesis following cerebral infarction affecting right dominant side: Secondary | ICD-10-CM | POA: Diagnosis not present

## 2021-03-18 DIAGNOSIS — I110 Hypertensive heart disease with heart failure: Secondary | ICD-10-CM | POA: Diagnosis not present

## 2021-03-19 ENCOUNTER — Ambulatory Visit: Payer: Medicare Other

## 2021-03-21 DIAGNOSIS — G35 Multiple sclerosis: Secondary | ICD-10-CM | POA: Diagnosis not present

## 2021-03-21 DIAGNOSIS — E559 Vitamin D deficiency, unspecified: Secondary | ICD-10-CM | POA: Diagnosis not present

## 2021-03-21 DIAGNOSIS — N951 Menopausal and female climacteric states: Secondary | ICD-10-CM | POA: Diagnosis not present

## 2021-03-21 DIAGNOSIS — E039 Hypothyroidism, unspecified: Secondary | ICD-10-CM | POA: Diagnosis not present

## 2021-03-21 DIAGNOSIS — R7989 Other specified abnormal findings of blood chemistry: Secondary | ICD-10-CM | POA: Diagnosis not present

## 2021-03-25 DIAGNOSIS — M9904 Segmental and somatic dysfunction of sacral region: Secondary | ICD-10-CM | POA: Diagnosis not present

## 2021-03-25 DIAGNOSIS — M9903 Segmental and somatic dysfunction of lumbar region: Secondary | ICD-10-CM | POA: Diagnosis not present

## 2021-03-25 DIAGNOSIS — M9901 Segmental and somatic dysfunction of cervical region: Secondary | ICD-10-CM | POA: Diagnosis not present

## 2021-03-25 DIAGNOSIS — M9905 Segmental and somatic dysfunction of pelvic region: Secondary | ICD-10-CM | POA: Diagnosis not present

## 2021-03-27 ENCOUNTER — Other Ambulatory Visit: Payer: Self-pay

## 2021-03-27 ENCOUNTER — Ambulatory Visit (INDEPENDENT_AMBULATORY_CARE_PROVIDER_SITE_OTHER): Payer: Medicare Other | Admitting: *Deleted

## 2021-03-27 DIAGNOSIS — I480 Paroxysmal atrial fibrillation: Secondary | ICD-10-CM | POA: Diagnosis not present

## 2021-03-27 DIAGNOSIS — Z5181 Encounter for therapeutic drug level monitoring: Secondary | ICD-10-CM

## 2021-03-27 DIAGNOSIS — M9904 Segmental and somatic dysfunction of sacral region: Secondary | ICD-10-CM | POA: Diagnosis not present

## 2021-03-27 DIAGNOSIS — M9903 Segmental and somatic dysfunction of lumbar region: Secondary | ICD-10-CM | POA: Diagnosis not present

## 2021-03-27 DIAGNOSIS — M9905 Segmental and somatic dysfunction of pelvic region: Secondary | ICD-10-CM | POA: Diagnosis not present

## 2021-03-27 DIAGNOSIS — M9901 Segmental and somatic dysfunction of cervical region: Secondary | ICD-10-CM | POA: Diagnosis not present

## 2021-03-27 LAB — POCT INR: INR: 2.2 (ref 2.0–3.0)

## 2021-03-27 NOTE — Patient Instructions (Signed)
Description   Today take 1.5 tablets then continue taking warfarin 1 tablet daily except for 1.5 tablets on Tuesdays. Recheck INR in 3 weeks.  Be consistent with your vitamin K foods. Coumadin Clinic [phone].

## 2021-03-28 DIAGNOSIS — I69351 Hemiplegia and hemiparesis following cerebral infarction affecting right dominant side: Secondary | ICD-10-CM | POA: Diagnosis not present

## 2021-03-28 DIAGNOSIS — I110 Hypertensive heart disease with heart failure: Secondary | ICD-10-CM | POA: Diagnosis not present

## 2021-03-28 DIAGNOSIS — J439 Emphysema, unspecified: Secondary | ICD-10-CM | POA: Diagnosis not present

## 2021-03-28 DIAGNOSIS — G35 Multiple sclerosis: Secondary | ICD-10-CM | POA: Diagnosis not present

## 2021-03-28 DIAGNOSIS — I5030 Unspecified diastolic (congestive) heart failure: Secondary | ICD-10-CM | POA: Diagnosis not present

## 2021-03-28 DIAGNOSIS — I429 Cardiomyopathy, unspecified: Secondary | ICD-10-CM | POA: Diagnosis not present

## 2021-04-01 DIAGNOSIS — I440 Atrioventricular block, first degree: Secondary | ICD-10-CM | POA: Diagnosis not present

## 2021-04-01 DIAGNOSIS — G35 Multiple sclerosis: Secondary | ICD-10-CM | POA: Diagnosis not present

## 2021-04-01 DIAGNOSIS — M170 Bilateral primary osteoarthritis of knee: Secondary | ICD-10-CM | POA: Diagnosis not present

## 2021-04-01 DIAGNOSIS — E039 Hypothyroidism, unspecified: Secondary | ICD-10-CM | POA: Diagnosis not present

## 2021-04-01 DIAGNOSIS — Z9181 History of falling: Secondary | ICD-10-CM | POA: Diagnosis not present

## 2021-04-01 DIAGNOSIS — E785 Hyperlipidemia, unspecified: Secondary | ICD-10-CM | POA: Diagnosis not present

## 2021-04-01 DIAGNOSIS — I429 Cardiomyopathy, unspecified: Secondary | ICD-10-CM | POA: Diagnosis not present

## 2021-04-01 DIAGNOSIS — R42 Dizziness and giddiness: Secondary | ICD-10-CM | POA: Diagnosis not present

## 2021-04-01 DIAGNOSIS — K589 Irritable bowel syndrome without diarrhea: Secondary | ICD-10-CM | POA: Diagnosis not present

## 2021-04-01 DIAGNOSIS — Z79899 Other long term (current) drug therapy: Secondary | ICD-10-CM | POA: Diagnosis not present

## 2021-04-01 DIAGNOSIS — I480 Paroxysmal atrial fibrillation: Secondary | ICD-10-CM | POA: Diagnosis not present

## 2021-04-01 DIAGNOSIS — M436 Torticollis: Secondary | ICD-10-CM | POA: Diagnosis not present

## 2021-04-01 DIAGNOSIS — G4700 Insomnia, unspecified: Secondary | ICD-10-CM | POA: Diagnosis not present

## 2021-04-01 DIAGNOSIS — M810 Age-related osteoporosis without current pathological fracture: Secondary | ICD-10-CM | POA: Diagnosis not present

## 2021-04-01 DIAGNOSIS — F419 Anxiety disorder, unspecified: Secondary | ICD-10-CM | POA: Diagnosis not present

## 2021-04-01 DIAGNOSIS — N319 Neuromuscular dysfunction of bladder, unspecified: Secondary | ICD-10-CM | POA: Diagnosis not present

## 2021-04-01 DIAGNOSIS — I110 Hypertensive heart disease with heart failure: Secondary | ICD-10-CM | POA: Diagnosis not present

## 2021-04-01 DIAGNOSIS — I272 Pulmonary hypertension, unspecified: Secondary | ICD-10-CM | POA: Diagnosis not present

## 2021-04-01 DIAGNOSIS — J439 Emphysema, unspecified: Secondary | ICD-10-CM | POA: Diagnosis not present

## 2021-04-01 DIAGNOSIS — I493 Ventricular premature depolarization: Secondary | ICD-10-CM | POA: Diagnosis not present

## 2021-04-01 DIAGNOSIS — K5901 Slow transit constipation: Secondary | ICD-10-CM | POA: Diagnosis not present

## 2021-04-01 DIAGNOSIS — I69351 Hemiplegia and hemiparesis following cerebral infarction affecting right dominant side: Secondary | ICD-10-CM | POA: Diagnosis not present

## 2021-04-01 DIAGNOSIS — Z7901 Long term (current) use of anticoagulants: Secondary | ICD-10-CM | POA: Diagnosis not present

## 2021-04-01 DIAGNOSIS — R296 Repeated falls: Secondary | ICD-10-CM | POA: Diagnosis not present

## 2021-04-01 DIAGNOSIS — I5030 Unspecified diastolic (congestive) heart failure: Secondary | ICD-10-CM | POA: Diagnosis not present

## 2021-04-02 ENCOUNTER — Telehealth: Payer: Self-pay

## 2021-04-02 NOTE — Telephone Encounter (Signed)
Claiborne Billings, PT from [facility] called requesting HHPT 1wk9 extension. Approval given.

## 2021-04-04 DIAGNOSIS — I110 Hypertensive heart disease with heart failure: Secondary | ICD-10-CM | POA: Diagnosis not present

## 2021-04-04 DIAGNOSIS — I429 Cardiomyopathy, unspecified: Secondary | ICD-10-CM | POA: Diagnosis not present

## 2021-04-04 DIAGNOSIS — J439 Emphysema, unspecified: Secondary | ICD-10-CM | POA: Diagnosis not present

## 2021-04-04 DIAGNOSIS — I5030 Unspecified diastolic (congestive) heart failure: Secondary | ICD-10-CM | POA: Diagnosis not present

## 2021-04-04 DIAGNOSIS — G35 Multiple sclerosis: Secondary | ICD-10-CM | POA: Diagnosis not present

## 2021-04-04 DIAGNOSIS — I69351 Hemiplegia and hemiparesis following cerebral infarction affecting right dominant side: Secondary | ICD-10-CM | POA: Diagnosis not present

## 2021-04-07 ENCOUNTER — Ambulatory Visit: Payer: Medicare Other

## 2021-04-08 DIAGNOSIS — J439 Emphysema, unspecified: Secondary | ICD-10-CM | POA: Diagnosis not present

## 2021-04-08 DIAGNOSIS — I110 Hypertensive heart disease with heart failure: Secondary | ICD-10-CM | POA: Diagnosis not present

## 2021-04-08 DIAGNOSIS — I5030 Unspecified diastolic (congestive) heart failure: Secondary | ICD-10-CM | POA: Diagnosis not present

## 2021-04-08 DIAGNOSIS — G35 Multiple sclerosis: Secondary | ICD-10-CM | POA: Diagnosis not present

## 2021-04-08 DIAGNOSIS — I69351 Hemiplegia and hemiparesis following cerebral infarction affecting right dominant side: Secondary | ICD-10-CM | POA: Diagnosis not present

## 2021-04-08 DIAGNOSIS — I429 Cardiomyopathy, unspecified: Secondary | ICD-10-CM | POA: Diagnosis not present

## 2021-04-15 DIAGNOSIS — I110 Hypertensive heart disease with heart failure: Secondary | ICD-10-CM | POA: Diagnosis not present

## 2021-04-15 DIAGNOSIS — I429 Cardiomyopathy, unspecified: Secondary | ICD-10-CM | POA: Diagnosis not present

## 2021-04-15 DIAGNOSIS — G35 Multiple sclerosis: Secondary | ICD-10-CM | POA: Diagnosis not present

## 2021-04-15 DIAGNOSIS — J439 Emphysema, unspecified: Secondary | ICD-10-CM | POA: Diagnosis not present

## 2021-04-15 DIAGNOSIS — I5030 Unspecified diastolic (congestive) heart failure: Secondary | ICD-10-CM | POA: Diagnosis not present

## 2021-04-15 DIAGNOSIS — I69351 Hemiplegia and hemiparesis following cerebral infarction affecting right dominant side: Secondary | ICD-10-CM | POA: Diagnosis not present

## 2021-04-17 ENCOUNTER — Ambulatory Visit (INDEPENDENT_AMBULATORY_CARE_PROVIDER_SITE_OTHER): Payer: Medicare Other | Admitting: *Deleted

## 2021-04-17 ENCOUNTER — Other Ambulatory Visit: Payer: Self-pay

## 2021-04-17 DIAGNOSIS — Z5181 Encounter for therapeutic drug level monitoring: Secondary | ICD-10-CM | POA: Diagnosis not present

## 2021-04-17 DIAGNOSIS — I480 Paroxysmal atrial fibrillation: Secondary | ICD-10-CM | POA: Diagnosis not present

## 2021-04-17 LAB — POCT INR: INR: 2.4 (ref 2.0–3.0)

## 2021-04-17 NOTE — Patient Instructions (Signed)
Description   Today take 1.5 tablets then start taking warfarin 1 tablet daily except for 1.5 tablets on Tuesdays and Thursdays. Recheck INR in 2 weeks with MD Appt. Be consistent with your vitamin K foods. [facility] [phone].

## 2021-04-20 DIAGNOSIS — E039 Hypothyroidism, unspecified: Secondary | ICD-10-CM | POA: Diagnosis not present

## 2021-04-23 DIAGNOSIS — I429 Cardiomyopathy, unspecified: Secondary | ICD-10-CM | POA: Diagnosis not present

## 2021-04-23 DIAGNOSIS — I69351 Hemiplegia and hemiparesis following cerebral infarction affecting right dominant side: Secondary | ICD-10-CM | POA: Diagnosis not present

## 2021-04-23 DIAGNOSIS — J439 Emphysema, unspecified: Secondary | ICD-10-CM | POA: Diagnosis not present

## 2021-04-23 DIAGNOSIS — I5030 Unspecified diastolic (congestive) heart failure: Secondary | ICD-10-CM | POA: Diagnosis not present

## 2021-04-23 DIAGNOSIS — G35 Multiple sclerosis: Secondary | ICD-10-CM | POA: Diagnosis not present

## 2021-04-23 DIAGNOSIS — I110 Hypertensive heart disease with heart failure: Secondary | ICD-10-CM | POA: Diagnosis not present

## 2021-04-28 DIAGNOSIS — I429 Cardiomyopathy, unspecified: Secondary | ICD-10-CM | POA: Diagnosis not present

## 2021-04-28 DIAGNOSIS — I110 Hypertensive heart disease with heart failure: Secondary | ICD-10-CM | POA: Diagnosis not present

## 2021-04-28 DIAGNOSIS — G35 Multiple sclerosis: Secondary | ICD-10-CM | POA: Diagnosis not present

## 2021-04-28 DIAGNOSIS — J439 Emphysema, unspecified: Secondary | ICD-10-CM | POA: Diagnosis not present

## 2021-04-28 DIAGNOSIS — I69351 Hemiplegia and hemiparesis following cerebral infarction affecting right dominant side: Secondary | ICD-10-CM | POA: Diagnosis not present

## 2021-04-28 DIAGNOSIS — I5030 Unspecified diastolic (congestive) heart failure: Secondary | ICD-10-CM | POA: Diagnosis not present

## 2021-04-28 NOTE — Progress Notes (Deleted)
Cardiology Office Note:    Date:  04/28/2021   ID:  [name] [name], DOB [birth year], MRN [id number]  PCP:  [name] Hillock, DO   [facility] Providers Cardiologist:  None {   Referring MD: [name] Hillock, DO     History of Present Illness:    [name] [name] is a 69 y.o. female with a hx of non-ischemic cardiomyopathy (TTE 2019 showed LVEF 45-50%), paroxysmal Afib frequent PVCs followed by [name] [name], h/o severe mitral regurgitation s/p MVR with a mechanical valve and maze in 2019 (52mm Sorin [employer]omed[employer] Optiform Mechanical Prosthesis) with improvement of LVEF to 65 to 70% who was previously followed by [name]. Meda [name] who now presents to clinic for follow-up.  Last saw [name]. Meda [name] on 10/01/20 where she was having occasional palpitations. Otherwise was doing well.   Past Medical History:  Diagnosis Date   Anxiety    Arthritis    knees   Breast cancer ([place]) 1999   CHF (congestive heart failure) ([place])    Related to severe mitral regurgitation, April, 2013   COPD (chronic obstructive pulmonary disease) ([place])    COPD with emphysema.. Assess by pulmonary team in the hospital April, 2013   Ejection fraction    EF 60%, echo, April, 2013, with severe MR before mitral valve replacement   Herpes    Hypothyroidism    IBS (irritable bowel syndrome)    Mitral valve regurgitation    Mitral valve replacement April, 2013, Mitral valve prolapse   Multiple sclerosis ([place])    Neurogenic bladder    Osteoporosis    Ovarian cyst    Paroxysmal atrial fibrillation ([facility])    Rapid atrial fibrillation in-hospital, Rapid cardioversion,  before mitral valve surgery   Pulmonary hypertension ([place])    Echo, April, 2013, before mitral valve surgery   S/P Maze operation for atrial fibrillation 01/26/2012   Complete biatrial lesion set using cryothermy via right mini thoracotomy   S/P mitral valve replacement 01/26/2012   52mm Sorin [employer]omed[employer] Optiform mechanical prosthesis via right mini thoracotomy    Warfarin anticoagulation    Mechanical mitral prosthesis, April, 20136    Past Surgical History:  Procedure Laterality Date   BREAST LUMPECTOMY Right 1999   with sent.node, and axillary dissection (20)   CHEST TUBE INSERTION  01/26/2012   Procedure: CHEST TUBE INSERTION;  Surgeon: [name] Alberts, MD;  Location: [place];  Service: Open Heart Surgery;  Laterality: Left;   COLONOSCOPY  2010   "normal"   [place]   urethral stricture repair   LEFT AND RIGHT HEART CATHETERIZATION WITH CORONARY ANGIOGRAM N/A 01/20/2012   Procedure: LEFT AND RIGHT HEART CATHETERIZATION WITH CORONARY ANGIOGRAM;  Surgeon: [name] Blanks, MD;  Location: [facility] CATH LAB;  Service: Cardiovascular;  Laterality: N/A;   LYMPHADENECTOMY     MAZE  01/26/2012   Procedure: MAZE;  Surgeon: [name] Alberts, MD;  Location: [place];  Service: Open Heart Surgery;  Laterality: N/A;   MITRAL VALVE REPLACEMENT  01/26/2012   Procedure: MINIMALLY INVASIVE MITRAL VALVE (MV) REPLACEMENT;  Surgeon: [name] Alberts, MD;  Location: [place];  Service: Open Heart Surgery;  Laterality: Right;   TEE WITHOUT CARDIOVERSION  01/19/2012   Procedure: TRANSESOPHAGEAL ECHOCARDIOGRAM (TEE);  Surgeon: [name] M Martinique, MD;  Location: [facility] ENDOSCOPY;  Service: Cardiovascular;  Laterality: N/A;   TONSILLECTOMY  1517   UMBILICAL HERNIA REPAIR  1952   WRIST SURGERY Right 2012  Current Medications: No outpatient medications have been marked as taking for the 05/01/21 encounter (Appointment) with [name] Bergeron, MD.     Allergies:   Erythromycin, Atorvastatin, and Valsartan   Social History   Socioeconomic History   Marital status: Widowed    Spouse name: Not on file   Number of children: 2   Years of education: 20   Highest education level: Not on file  Occupational History   Occupation: Retired  Tobacco Use   Smoking status: Never   Smokeless tobacco: Never  Vaping Use   Vaping Use: Never used   Substance and Sexual Activity   Alcohol use: No   Drug use: No   Sexual activity: Never    Birth control/protection: None  Other Topics Concern   Not on file  Social History Narrative   Patient is a widower (since 32), he currently lives with her son and daughter-in-law. She has 2 children.   She is college educated, and retired from the [employer].   She uses herbal remedies, and multiple over-the-counter supplements. She takes a daily vitamin.   She wears her seatbelt, exercises routinely, smoke detector in the home.   Requires a walker or wheelchair at times.   Feels safe in her relationships.   Social Determinants of Health   Financial Resource Strain: Not on file  Food Insecurity: Not on file  Transportation Needs: Not on file  Physical Activity: Not on file  Stress: Not on file  Social Connections: Not on file     Family History: The patient's ***family history includes Breast cancer in her maternal aunt and maternal grandmother; CAD in her father, mother, and another family member; Heart disease in her father; Hyperlipidemia in her mother; Hypertension in her father and mother.  ROS:   Please see the history of present illness.    *** All other systems reviewed and are negative.  EKGs/Labs/Other Studies Reviewed:    The following studies were reviewed today: TTE: 04/2020  1. Left ventricular ejection fraction, by estimation, is 65 to 70%. The  left ventricle has normal function. The left ventricle has no regional  wall motion abnormalities. Left ventricular diastolic function could not  be evaluated.   2. Right ventricular systolic function is normal. The right ventricular  size is normal. There is normal pulmonary artery systolic pressure. The  estimated right ventricular systolic pressure is 21.1 mmHg.   3. The mitral valve is normal in structure. No evidence of mitral valve  regurgitation. No evidence of mitral stenosis. The mean mitral valve  gradient is  3.0 mmHg with average heart rate of 65 bpm. There is a 31 mm  Sorin present in the mitral position.   4. The aortic valve is normal in structure. Aortic valve regurgitation is  not visualized. No aortic stenosis is present.   5. The inferior vena cava is normal in size with greater than 50%  respiratory variability, suggesting right atrial pressure of 3 mmHg  EKG:  EKG is *** ordered today.  The ekg ordered today demonstrates ***  Recent Labs: 01/11/2021: TSH 0.371 01/15/2021: ALT 16 01/24/2021: BUN 29; Creatinine, Ser 0.88; Hemoglobin 13.3; Platelets 278; Potassium 4.3; Sodium 137  Recent Lipid Panel    Component Value Date/Time   CHOL 204 (H) 01/12/2021 0207   TRIG 53 01/12/2021 0207   HDL 62 01/12/2021 0207   CHOLHDL 3.3 01/12/2021 0207   VLDL 11 01/12/2021 0207   LDLCALC 131 (H) 01/12/2021 0207  Risk Assessment/Calculations:   {Does this patient have ATRIAL FIBRILLATION?:[phone]}       Physical Exam:    VS:  There were no vitals taken for this visit.    Wt Readings from Last 3 Encounters:  02/19/21 147 lb 3.2 oz (66.8 kg)  02/06/21 146 lb 6.4 oz (66.4 kg)  01/30/21 145 lb 8.1 oz (66 kg)     GEN: *** Well nourished, well developed in no acute distress HEENT: Normal NECK: No JVD; No carotid bruits LYMPHATICS: No lymphadenopathy CARDIAC: ***RRR, no murmurs, rubs, gallops RESPIRATORY:  Clear to auscultation without rales, wheezing or rhonchi  ABDOMEN: Soft, non-tender, non-distended MUSCULOSKELETAL:  No edema; No deformity  SKIN: Warm and dry NEUROLOGIC:  Alert and oriented x 3 PSYCHIATRIC:  Normal affect   ASSESSMENT:    No diagnosis found. PLAN:    In order of problems listed above:  #History of NICM with Recovered EF: TTE 03/2020 with LVEF 65-70%. Euvolemic on exam with NYHA class ** symptoms. -Continue metop tartrate 25mg  BID -Continue lisinopril 5mg  daily  #History of PVCs: Followed by [name]. Lovena [name]. Doing well. -Continue metop 25mg  BID with  additional dose as needed  #Paroxysmal Afib: CHADs-vasc ***. S/p MAZE during MVR. On warfarin for [facility] given mechanical MVR. -Continue warfarin  -Continue metop tartrate 25mg  BID  #Severe MR s/p mechanical MVR: Last TTE with mean gradient 61mmHg. -Continue warfarin for AC -Needs SBE ppx -Continue serial monitoring  #HLD: -Continue crestor 40mg  daily    {Are you ordering a CV Procedure (e.g. stress test, cath, DCCV, TEE, etc)?   Press F2        :[id number]}    Medication Adjustments/Labs and Tests Ordered: Current medicines are reviewed at length with the patient today.  Concerns regarding medicines are outlined above.  No orders of the defined types were placed in this encounter.  No orders of the defined types were placed in this encounter.   There are no Patient Instructions on file for this visit.   Signed, [name] Bergeron, MD  04/28/2021 7:19 AM    [place]

## 2021-04-29 ENCOUNTER — Encounter: Payer: Self-pay | Admitting: Family Medicine

## 2021-04-29 ENCOUNTER — Other Ambulatory Visit: Payer: Self-pay

## 2021-04-29 ENCOUNTER — Ambulatory Visit (INDEPENDENT_AMBULATORY_CARE_PROVIDER_SITE_OTHER): Payer: Medicare Other | Admitting: Family Medicine

## 2021-04-29 VITALS — BP 102/57 | HR 65 | Temp 98.4°F | Ht 67.0 in | Wt 145.0 lb

## 2021-04-29 DIAGNOSIS — I63342 Cerebral infarction due to thrombosis of left cerebellar artery: Secondary | ICD-10-CM

## 2021-04-29 DIAGNOSIS — Z8673 Personal history of transient ischemic attack (TIA), and cerebral infarction without residual deficits: Secondary | ICD-10-CM

## 2021-04-29 DIAGNOSIS — H6982 Other specified disorders of Eustachian tube, left ear: Secondary | ICD-10-CM | POA: Diagnosis not present

## 2021-04-29 MED ORDER — TETANUS-DIPHTH-ACELL PERTUSSIS 5-2.5-18.5 LF-MCG/0.5 IM SUSP: 0.5000 mL | Freq: Once | INTRAMUSCULAR | 0 refills | Status: DC

## 2021-04-29 MED ORDER — FLUTICASONE PROPIONATE 50 MCG/ACT NA SUSP: 1.0000 | Freq: Every day | NASAL | 6 refills | Status: DC

## 2021-04-29 NOTE — Patient Instructions (Signed)
Eustachian Tube Dysfunction  Eustachian tube dysfunction refers to a condition in which a blockage develops in the narrow passage that connects the middle ear to the back of the nose (eustachian tube). The eustachian tube regulates air pressure in the middle ear by letting air move between the ear and nose. It also helps to drain fluid from the middle earspace. Eustachian tube dysfunction can affect one or both ears. When the eustachian tube does not function properly, air pressure, fluid, or both can build up inthe middle ear. What are the causes? This condition occurs when the eustachian tube becomes blocked or cannot open normally. Common causes of this condition include: Ear infections. Colds and other infections that affect the nose, mouth, and throat (upper respiratory tract). Allergies. Irritation from cigarette smoke. Irritation from stomach acid coming up into the esophagus (gastroesophageal reflux). The esophagus is the tube that carries food from the mouth to the stomach. Sudden changes in air pressure, such as from descending in an airplane or scuba diving. Abnormal growths in the nose or throat, such as: Growths that line the nose (nasal polyps). Abnormal growth of cells (tumors). Enlarged tissue at the back of the throat (adenoids). What increases the risk? You are more likely to develop this condition if: You smoke. You are overweight. You are a child who has: Certain birth defects of the mouth, such as cleft palate. Large tonsils or adenoids. What are the signs or symptoms? Common symptoms of this condition include: A feeling of fullness in the ear. Ear pain. Clicking or popping noises in the ear. Ringing in the ear. Hearing loss. Loss of balance. Dizziness. Symptoms may get worse when the air pressure around you changes, such as whenyou travel to an area of high elevation, fly on an airplane, or go scuba diving. How is this diagnosed? This condition may be diagnosed  based on: Your symptoms. A physical exam of your ears, nose, and throat. Tests, such as those that measure: The movement of your eardrum (tympanogram). Your hearing (audiometry). How is this treated? Treatment depends on the cause and severity of your condition. In mild cases, you may relieve your symptoms by moving air into your ears. This is called "popping the ears." In more severe cases, or if you have symptoms of fluid in your ears, treatment may include: Medicines to relieve congestion (decongestants). Medicines that treat allergies (antihistamines). Nasal sprays or ear drops that contain medicines that reduce swelling (steroids). A procedure to drain the fluid in your eardrum (myringotomy). In this procedure, a small tube is placed in the eardrum to: Drain the fluid. Restore the air in the middle ear space. A procedure to insert a balloon device through the nose to inflate the opening of the eustachian tube (balloon dilation). Follow these instructions at home: Lifestyle Do not do any of the following until your health care provider approves: Travel to high altitudes. Fly in airplanes. Work in a pressurized cabin or room. Scuba dive. Do not use any products that contain nicotine or tobacco, such as cigarettes and e-cigarettes. If you need help quitting, ask your health care provider. Keep your ears dry. Wear fitted earplugs during showering and bathing. Dry your ears completely after. General instructions Take over-the-counter and prescription medicines only as told by your health care provider. Use techniques to help pop your ears as recommended by your health care provider. These may include: Chewing gum. Yawning. Frequent, forceful swallowing. Closing your mouth, holding your nose closed, and gently blowing as if you   are trying to blow air out of your nose. Keep all follow-up visits as told by your health care provider. This is important. Contact a health care provider  if: Your symptoms do not go away after treatment. Your symptoms come back after treatment. You are unable to pop your ears. You have: A fever. Pain in your ear. Pain in your head or neck. Fluid draining from your ear. Your hearing suddenly changes. You become very dizzy. You lose your balance. Summary Eustachian tube dysfunction refers to a condition in which a blockage develops in the eustachian tube. It can be caused by ear infections, allergies, inhaled irritants, or abnormal growths in the nose or throat. Symptoms include ear pain, hearing loss, or ringing in the ears. Mild cases are treated with maneuvers to unblock the ears, such as yawning or ear popping. Severe cases are treated with medicines. Surgery may also be done (rare). This information is not intended to replace advice given to you by your health care provider. Make sure you discuss any questions you have with your healthcare provider. Document Revised: 01/25/2018 Document Reviewed: 01/25/2018 Elsevier Patient Education  2022 [employer]  

## 2021-04-29 NOTE — Progress Notes (Signed)
This visit occurred during the SARS-CoV-2 public health emergency.  Safety protocols were in place, including screening questions prior to the visit, additional usage of staff PPE, and extensive cleaning of exam room while observing appropriate contact time as indicated for disinfecting solutions.    [name] [name] , 05-Jan-1951, 70 y.o., female MRN: [id number] Patient Care Team    Relationship Specialty Notifications Start End  Ma Hillock, DO PCP - General Family Medicine  07/31/16   Elsie Stain, MD Attending Physician Pulmonary Disease Abnormal results only, Admissions 03/03/12   Wallene Huh, DPM Consulting Physician Podiatry  08/11/16   Dorothy Spark, MD (Inactive) Consulting Physician Cardiology  08/11/16   Mingo Amber, MD  Alternative Medicine  08/11/16   Ditty, Kevan Ny, MD Consulting Physician Neurosurgery  08/11/16   Princess Bruins, MD Consulting Physician Obstetrics and Gynecology  09/18/16   Vinnie Level  Dentistry  09/18/16   Druscilla Brownie, MD Consulting Physician Dermatology  09/18/16   Pieter Partridge, DO Consulting Physician Neurology  12/19/18   Calvert Cantor, MD Consulting Physician Ophthalmology  03/28/19    Comment: [place] [facility]    Chief Complaint  Patient presents with   Hospitalization Follow-up    Update PCP     Subjective: Pt presents for an OV to discuss her recent stroke.  Patient was admitted to the hospital January 13, 2021 with a brainstem infarct.  She was found to have a left pontine cerebrovascular accident by MRI brain wedge-shaped area of diffusion hyperintensity at the left paramedian pons without corresponding T2 hyperintensity suspicious for acute perforator infarct.  Patient has completed inpatient rehab and is transitioning to outpatient rehab.  She has been prescribed statin and reports she is not taking this medication and is looking for a more natural approach.  She states she reported to the ED because she was  having difficulty speaking when she became very dizzy.  She also endorses having pressure and discomfort in her left ear.  She states that sounds "fizzy "behind her left ear.  Has been present since on immunization.  Depression screen [facility] 2/9 02/06/2021 12/18/2019 03/28/2019 03/15/2018  Decreased Interest 0 0 0 0  Down, Depressed, Hopeless 0 0 0 0  PHQ - 2 Score 0 0 0 0  Tired, decreased energy 1 - - -  Change in appetite 0 - - -  Feeling bad or failure about yourself  0 - - -  Trouble concentrating 0 - - -  Moving slowly or fidgety/restless 0 - - -  Suicidal thoughts 0 - - -  Difficult doing work/chores Not difficult at all - - -  Some recent data might be hidden    Allergies  Allergen Reactions   Erythromycin Nausea And Vomiting   Atorvastatin Other (See Comments)    Pt reports causes body to be stiff, joints ache, aching in knees and ankles, feels more fatigued.    Valsartan Other (See Comments)    Pt reports caused her depression   Social History   Social History Narrative   Patient is a widower (since 2000), he currently lives with her son and daughter-in-law. She has 2 children.   She is college educated, and retired from the [employer].   She uses herbal remedies, and multiple over-the-counter supplements. She takes a daily vitamin.   She wears her seatbelt, exercises routinely, smoke detector in the home.   Requires a walker or wheelchair at times.   Feels  safe in her relationships.   Past Medical History:  Diagnosis Date   Anxiety    Arthritis    knees   Breast cancer ([place]) 1999   CHF (congestive heart failure) ([place])    Related to severe mitral regurgitation, April, 2013   COPD (chronic obstructive pulmonary disease) ([place])    COPD with emphysema.. Assess by pulmonary team in the hospital April, 2013   Ejection fraction    EF 60%, echo, April, 2013, with severe MR before mitral valve replacement   Herpes    Hypothyroidism    IBS (irritable bowel syndrome)     Mitral valve regurgitation    Mitral valve replacement April, 2013, Mitral valve prolapse   Multiple sclerosis ([place])    Neurogenic bladder    Osteoporosis    Ovarian cyst    Paroxysmal atrial fibrillation (H[facility])    Rapid atrial fibrillation in-hospital, Rapid cardioversion,  before mitral valve surgery   Pulmonary hypertension ([place])    Echo, April, 2013, before mitral valve surgery   S/P Maze operation for atrial fibrillation 01/26/2012   Complete biatrial lesion set using cryothermy via right mini thoracotomy   S/P mitral valve replacement 01/26/2012   67mm Sorin [employer]omed[employer] Optiform mechanical prosthesis via right mini thoracotomy   Warfarin anticoagulation    Mechanical mitral prosthesis, April, 20136   Past Surgical History:  Procedure Laterality Date   BREAST LUMPECTOMY Right 1999   with sent.node, and axillary dissection (20)   CHEST TUBE INSERTION  01/26/2012   Procedure: CHEST TUBE INSERTION;  Surgeon: Rexene Alberts, MD;  Location: [facility];  Service: Open Heart Surgery;  Laterality: Left;   COLONOSCOPY  2010   "normal"   [place]   urethral stricture repair   LEFT AND RIGHT HEART CATHETERIZATION WITH CORONARY ANGIOGRAM N/A 01/20/2012   Procedure: LEFT AND RIGHT HEART CATHETERIZATION WITH CORONARY ANGIOGRAM;  Surgeon: Burnell Blanks, MD;  Location: [facility] CATH LAB;  Service: Cardiovascular;  Laterality: N/A;   LYMPHADENECTOMY     MAZE  01/26/2012   Procedure: MAZE;  Surgeon: Rexene Alberts, MD;  Location: [facility];  Service: Open Heart Surgery;  Laterality: N/A;   MITRAL VALVE REPLACEMENT  01/26/2012   Procedure: MINIMALLY INVASIVE MITRAL VALVE (MV) REPLACEMENT;  Surgeon: Rexene Alberts, MD;  Location: [facility];  Service: Open Heart Surgery;  Laterality: Right;   TEE WITHOUT CARDIOVERSION  01/19/2012   Procedure: TRANSESOPHAGEAL ECHOCARDIOGRAM (TEE);  Surgeon: Peter M Martinique, MD;  Location: [facility] ENDOSCOPY;  Service: Cardiovascular;   Laterality: N/A;   TONSILLECTOMY  6195   UMBILICAL HERNIA REPAIR  1952   WRIST SURGERY Right 2012   Family History  Problem Relation Age of Onset   Heart disease Father        cardiac arrest    CAD Father    Hypertension Father    CAD Mother        5 stents and numerous bypass surgery   Hypertension Mother    Hyperlipidemia Mother    CAD Other    Breast cancer Maternal Grandmother    Breast cancer Maternal Aunt    Allergies as of 04/29/2021       Reactions   Erythromycin Nausea And Vomiting   Atorvastatin Other (See Comments)   Pt reports causes body to be stiff, joints ache, aching in knees and ankles, feels more fatigued.    Valsartan Other (See Comments)   Pt reports caused her depression  Medication List        Accurate as of April 29, 2021 11:59 PM. If you have any questions, ask your nurse or doctor.          acetaminophen 325 MG tablet Commonly known as: TYLENOL Take 2 tablets (650 mg total) by mouth every 4 (four) hours as needed for mild pain (or temp > 37.5 C (99.5 F)).   Armour Thyroid 90 MG tablet Generic drug: thyroid Take 1 tablet (90 mg total) by mouth daily.   Biotin 5000 MCG Caps Take 10,000 mcg by mouth in the morning and at bedtime.   Black Cohosh 40 MG Caps Take 40 mg by mouth every evening.   BROMELAIN PO Take 1 capsule by mouth 2 (two) times daily.   calcium carbonate 1500 (600 Ca) MG Tabs tablet Commonly known as: OSCAL Take 1,500 mg by mouth daily. Takes 1 time daily   Co Q-10 100 MG Caps Take 1 capsule by mouth daily.   Cranberry 500 MG Caps Take 1 capsule by mouth daily.   fluticasone 50 MCG/ACT nasal spray Commonly known as: FLONASE Place 1 spray into both nostrils daily. Started by: Howard Pouch, DO   GLUCOSAMINE CHOND COMPLEX/MSM PO Take 1 tablet by mouth in the morning and at bedtime. Strength of dose Glucosamine 1500mg  /Chodroitron 1000mg / MSM 500mg  takes one twice daily   L-THEANINE PO Take 1 capsule by  mouth daily.   lactose free nutrition Liqd Take 237 mLs by mouth daily.   lidocaine-prilocaine cream Commonly known as: EMLA Apply 1 application topically as needed (per pt this is for use before blood draws).   lisinopril 5 MG tablet Commonly known as: ZESTRIL Take 1 tablet (5 mg total) by mouth daily.   Lysine 500 MG Caps Take 1 capsule by mouth daily.   Magnesium Citrate 200 MG Tabs Take 200 mg by mouth daily.   meclizine 12.5 MG tablet Commonly known as: ANTIVERT Take 1 tablet (12.5 mg total) by mouth 2 (two) times daily as needed for dizziness.   metoprolol tartrate 25 MG tablet Commonly known as: LOPRESSOR TAKE 1 TAB (25 MG) PO TWICE DAILY - pt must make appt with provider for further refills - 1st attempt   MILK THISTLE PO Take 1 capsule by mouth daily.   Olive Leaf 500 MG Caps Take 1 capsule by mouth 2 (two) times daily.   OVER THE COUNTER MEDICATION cholestaid   PETADOLEX PO Take 1 tablet by mouth 2 (two) times daily.   PROBIOTIC DAILY PO Take 1 capsule by mouth daily. Take 1 capsule once a day   PROGESTERONE MICRONIZED PO Take 100 mg by mouth daily.   Pumpkin Seed 500 MG Tabs Take 1 tablet by mouth 2 (two) times daily.   rosuvastatin 40 MG tablet Commonly known as: CRESTOR Take 1 tablet (40 mg total) by mouth daily.   TURMERIC PO Take 1 capsule by mouth daily.   vitamin A 10000 UNIT capsule Take 10,000 Units by mouth daily.   vitamin B-12 1000 MCG tablet Commonly known as: CYANOCOBALAMIN Take 1,000 mcg by mouth daily. Taking sublingal   Vitamin D3 50 MCG (2000 UT) capsule Take 4,000 Units by mouth daily.   vitamin E 180 MG (400 UNITS) capsule Take 400 Units by mouth daily.   warfarin 5 MG tablet Commonly known as: Coumadin Take as directed by the anticoagulation clinic. If you are unsure how to take this medication, talk to your nurse or doctor. Original instructions: Take 1 tablet (  5 mg total) by mouth daily.        All past  medical history, surgical history, allergies, family history, immunizations andmedications were updated in the EMR today and reviewed under the history and medication portions of their EMR.     ROS: Negative, with the exception of above mentioned in HPI   Objective:  BP (!) 102/57   Pulse 65   Temp 98.4 F (36.9 C) (Oral)   Ht 5\' 7"  (1.702 m)   Wt 145 lb (65.8 kg)   SpO2 95%   BMI 22.71 kg/m  Body mass index is 22.71 kg/m. Gen: Afebrile. No acute distress. Nontoxic in appearance, well developed, well nourished.  HENT: AT. [place]. Bilateral TM visualized left TM with mild her fluid levels, no erythema.  EAC normal bilaterally.  Right TM is normal.  MMM, no oral lesions.  No cough or hoarseness. Eyes:Pupils Equal Round Reactive to light, Extraocular movements intact,  Conjunctiva without redness, discharge or icterus. Neck/lymp/endocrine: Supple, no lymphadenopathy, mild torticollis. CV: RRR, no edema Chest: CTAB, no wheeze or crackles. Good air movement, normal resp effort.  Neuro: Ambulates well with walker. PERLA. EOMi. Alert. Oriented x3 Psych: Normal affect, dress and demeanor. Normal speech. Normal thought content and judgment.  No results found. No results found. No results found for this or any previous visit (from the past 24 hour(s)).  Assessment/Plan: DAMONICA CHOPRA is a 70 y.o. 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  She states she will consider this and think about it.  She is thinking of using dandelion root instead. She reports compliance with her Coumadin.  She follows with Coumadin clinic and cardiology. She follows with neurology.  Eustachian tube dysfunction, left Her discomfort is secondary to eustachian tube this function possibly secondary to her torticollis. Discussed lymph massage. Discussed Flonase nasal  spray.  Reviewed expectations re: course of current medical issues. Discussed self-management of symptoms. Outlined signs and symptoms indicating need for more acute intervention. Patient verbalized understanding and all questions were answered. Patient received an After-Visit Summary.    No orders of the defined types were placed in this encounter.  Meds ordered this encounter  Medications   DISCONTD: Tdap (BOOSTRIX) 5-2.5-18.5 LF-MCG/0.5 injection    Sig: Inject 0.5 mLs into the muscle once for 1 dose.    Dispense:  0.5 mL    Refill:  0   fluticasone (FLONASE) 50 MCG/ACT nasal spray    Sig: Place 1 spray into both nostrils daily.    Dispense:  16 g    Refill:  6   DISCONTD: Tdap (BOOSTRIX) 5-2.5-18.5 LF-MCG/0.5 injection    Sig: Inject 0.5 mLs into the muscle once for 1 dose.    Dispense:  0.5 mL    Refill:  0    Referral Orders  No referral(s) requested today     Note is dictated utilizing voice recognition software. Although note has been proof read prior to signing, occasional typographical errors still can be missed. If any questions arise, please do not hesitate to call for verification.   electronically signed by:  Howard Pouch, DO  [place]

## 2021-04-30 NOTE — Progress Notes (Signed)
Cardiology Office Note:    Date:  05/01/2021   ID:  [name] [name], DOB 1950-12-08, MRN [id number]  PCP:  Ma Hillock, DO   [facility] Providers Cardiologist:  None {   Referring MD: Ma Hillock, DO     History of Present Illness:    [name] [name] is a 70 y.o. female with a hx of non-ischemic cardiomyopathy (TTE 2019 showed LVEF 45-50%), paroxysmal Afib frequent PVCs followed by Dr Lovena Le, h/o severe mitral regurgitation s/p MVR with a mechanical valve and maze in 2019 (57mm Sorin [employer]omedics Optiform Mechanical Prosthesis) with improvement of LVEF to 65 to 70% who was previously followed by Dr. Meda Coffee who now presents to clinic for follow-up.  Last saw Dr. Meda Coffee on 10/01/20 where she was having occasional palpitations. Otherwise was doing well.   Today, patient says feels good overall. She has chronic intermittent fatigue which is unchanged for her and intermittent episodes of pleuritic chest pain. No chest pain with exertion. Has occasional palpitations for which she takes an extra dose of metop which helps. Otherwise, no LE edema, orthopnea, PND, lightheadedness or dizziness.   She says in October she received her booster shot, and in November she notices "fizziness" inside her left ear and happens persistently after she eats. This has been helped by flonase.   Past Medical History:  Diagnosis Date   Anxiety    Arthritis    knees   Breast cancer ([place]) 1999   CHF (congestive heart failure) ([place])    Related to severe mitral regurgitation, April, 2013   COPD (chronic obstructive pulmonary disease) ([place])    COPD with emphysema.. Assess by pulmonary team in the hospital April, 2013   Ejection fraction    EF 60%, echo, April, 2013, with severe MR before mitral valve replacement   Herpes    Hypothyroidism    IBS (irritable bowel syndrome)    Mitral valve regurgitation    Mitral valve replacement April, 2013, Mitral valve prolapse   Multiple sclerosis ([place])     Neurogenic bladder    Osteoporosis    Ovarian cyst    Paroxysmal atrial fibrillation (HCC)    Rapid atrial fibrillation in-hospital, Rapid cardioversion,  before mitral valve surgery   Pulmonary hypertension ([place])    Echo, April, 2013, before mitral valve surgery   S/P Maze operation for atrial fibrillation 01/26/2012   Complete biatrial lesion set using cryothermy via right mini thoracotomy   S/P mitral valve replacement 01/26/2012   19mm Sorin [employer]omedics Optiform mechanical prosthesis via right mini thoracotomy   Warfarin anticoagulation    Mechanical mitral prosthesis, April, 20136    Past Surgical History:  Procedure Laterality Date   BREAST LUMPECTOMY Right 1999   with sent.node, and axillary dissection (20)   CHEST TUBE INSERTION  01/26/2012   Procedure: CHEST TUBE INSERTION;  Surgeon: Rexene Alberts, MD;  Location: [place];  Service: Open Heart Surgery;  Laterality: Left;   COLONOSCOPY  2010   "normal"   [place]   urethral stricture repair   LEFT AND RIGHT HEART CATHETERIZATION WITH CORONARY ANGIOGRAM N/A 01/20/2012   Procedure: LEFT AND RIGHT HEART CATHETERIZATION WITH CORONARY ANGIOGRAM;  Surgeon: Burnell Blanks, MD;  Location: [facility] C[facility] LAB;  Service: Cardiovascular;  Laterality: N/A;   LYMPHADENECTOMY     MAZE  01/26/2012   Procedure: MAZE;  Surgeon: Rexene Alberts, MD;  Location: [place];  Service: Open Heart Surgery;  Laterality: N/A;   MITRAL VALVE REPLACEMENT  01/26/2012   Procedure: MINIMALLY INVASIVE MITRAL VALVE (MV) REPLACEMENT;  Surgeon: Rexene Alberts, MD;  Location: [place];  Service: Open Heart Surgery;  Laterality: Right;   TEE WITHOUT CARDIOVERSION  01/19/2012   Procedure: TRANSESOPHAGEAL ECHOCARDIOGRAM (TEE);  Surgeon: Peter M Martinique, MD;  Location: [place];  Service: Cardiovascular;  Laterality: N/A;   TONSILLECTOMY  1740   UMBILICAL HERNIA REPAIR  1952   WRIST SURGERY Right 2012    Current  Medications: Current Meds  Medication Sig   acetaminophen (TYLENOL) 325 MG tablet Take 2 tablets (650 mg total) by mouth every 4 (four) hours as needed for mild pain (or temp > 37.5 C (99.5 F)).   ARMOUR THYROID 90 MG tablet Take 1 tablet (90 mg total) by mouth daily.   Biotin 5000 MCG CAPS Take 10,000 mcg by mouth in the morning and at bedtime.   Black Cohosh 40 MG CAPS Take 40 mg by mouth every evening.   Bromelains (BROMELAIN PO) Take 1 capsule by mouth 2 (two) times daily.   calcium carbonate (OSCAL) 1500 (600 Ca) MG TABS tablet Take 1,500 mg by mouth daily. Takes 1 time daily   Cholecalciferol (VITAMIN D3) 2000 units capsule Take 4,000 Units by mouth daily.   Coenzyme Q10 (CO Q-10) 100 MG CAPS Take 1 capsule by mouth daily.    Cranberry 500 MG CAPS Take 1 capsule by mouth daily.   ezetimibe (ZETIA) 10 MG tablet Take 1 tablet (10 mg total) by mouth daily.   fluticasone (FLONASE) 50 MCG/ACT nasal spray Place 1 spray into both nostrils daily.   L-THEANINE PO Take 1 capsule by mouth daily.    lactose free nutrition (BOOST PLUS) LIQD Take 237 mLs by mouth daily.    lidocaine-prilocaine (EMLA) cream Apply 1 application topically as needed (per pt this is for use before blood draws).    lisinopril (ZESTRIL) 5 MG tablet Take 1 tablet (5 mg total) by mouth daily.   Lysine 500 MG CAPS Take 1 capsule by mouth daily.   Magnesium Citrate 200 MG TABS Take 200 mg by mouth daily.   meclizine (ANTIVERT) 12.5 MG tablet Take 1 tablet (12.5 mg total) by mouth 2 (two) times daily as needed for dizziness.   metoprolol tartrate (LOPRESSOR) 25 MG tablet TAKE 1 TAB (25 MG) PO TWICE DAILY - pt must make appt with provider for further refills - 1st attempt   MILK THISTLE PO Take 1 capsule by mouth daily.   Misc Natural Products (GLUCOSAMINE CHOND COMPLEX/MSM PO) Take 1 tablet by mouth in the morning and at bedtime. Strength of dose Glucosamine 1500mg  /Chodroitron 1000mg / MSM 500mg  takes one twice daily   Olive  Leaf 500 MG CAPS Take 1 capsule by mouth 2 (two) times daily.    OVER THE COUNTER MEDICATION cholestaid   Petasin (PETADOLEX PO) Take 1 tablet by mouth 2 (two) times daily.   Probiotic Product (PROBIOTIC DAILY PO) Take 1 capsule by mouth daily. Take 1 capsule once a day   PROGESTERONE MICRONIZED PO Take 100 mg by mouth daily.    Pumpkin Seed 500 MG TABS Take 1 tablet by mouth 2 (two) times daily.   TURMERIC PO Take 1 capsule by mouth daily.   vitamin A 10000 UNIT capsule Take 10,000 Units by mouth daily.   vitamin B-12 (CYANOCOBALAMIN) 1000 MCG tablet Take 1,000 mcg by mouth daily. Taking sublingal   vitamin E 400 UNIT capsule Take 400 Units by  mouth daily.   warfarin (COUMADIN) 5 MG tablet Take 1 tablet (5 mg total) by mouth daily.   [DISCONTINUED] rosuvastatin (CRESTOR) 40 MG tablet Take 1 tablet (40 mg total) by mouth daily.     Allergies:   Erythromycin, Atorvastatin, and Valsartan   Social History   Socioeconomic History   Marital status: Widowed    Spouse name: Not on file   Number of children: 2   Years of education: 65   Highest education level: Not on file  Occupational History   Occupation: Retired  Tobacco Use   Smoking status: Never   Smokeless tobacco: Never  Vaping Use   Vaping Use: Never used  Substance and Sexual Activity   Alcohol use: No   Drug use: No   Sexual activity: Never    Birth control/protection: None  Other Topics Concern   Not on file  Social History Narrative   Patient is a widower (since 30), he currently lives with her son and daughter-in-law. She has 2 children.   She is college educated, and retired from the [employer].   She uses herbal remedies, and multiple over-the-counter supplements. She takes a daily vitamin.   She wears her seatbelt, exercises routinely, smoke detector in the home.   Requires a walker or wheelchair at times.   Feels safe in her relationships.   Social Determinants of Health   Financial Resource Strain:  Not on file  Food Insecurity: Not on file  Transportation Needs: Not on file  Physical Activity: Not on file  Stress: Not on file  Social Connections: Not on file     Family History: The patient's family history includes Breast cancer in her maternal aunt and maternal grandmother; CAD in her father, mother, and another family member; Heart disease in her father; Hyperlipidemia in her mother; Hypertension in her father and mother.  ROS:   Review of Systems  Constitutional:  Positive for malaise/fatigue. Negative for fever.  HENT:  Negative for congestion and hearing loss.   Eyes:  Negative for blurred vision and redness.  Respiratory:  Negative for cough and shortness of breath.   Cardiovascular:  Positive for chest pain and palpitations. Negative for orthopnea, claudication, leg swelling and PND.  Gastrointestinal:  Negative for abdominal pain, constipation and diarrhea.  Genitourinary:  Negative for dysuria and hematuria.  Musculoskeletal:  Negative for joint pain and neck pain.  Neurological:  Negative for dizziness, tremors and headaches.  Psychiatric/Behavioral:  Negative for depression. The patient does not have insomnia.    EKGs/Labs/Other Studies Reviewed:    The following studies were reviewed today: TTE: 04/2020  1. Left ventricular ejection fraction, by estimation, is 65 to 70%. The  left ventricle has normal function. The left ventricle has no regional  wall motion abnormalities. Left ventricular diastolic function could not  be evaluated.   2. Right ventricular systolic function is normal. The right ventricular  size is normal. There is normal pulmonary artery systolic pressure. The  estimated right ventricular systolic pressure is 12.4 mmHg.   3. The mitral valve is normal in structure. No evidence of mitral valve  regurgitation. No evidence of mitral stenosis. The mean mitral valve  gradient is 3.0 mmHg with average heart rate of 65 bpm. There is a 31 mm  Sorin  present in the mitral position.   4. The aortic valve is normal in structure. Aortic valve regurgitation is  not visualized. No aortic stenosis is present.   5. The inferior vena cava  is normal in size with greater than 50%  respiratory variability, suggesting right atrial pressure of 3 mmHg  EKG:   05/01/2021: no ekg is ordered today.  Recent Labs: 01/11/2021: TSH 0.371 01/15/2021: ALT 16 01/24/2021: BUN 29; Creatinine, Ser 0.88; Hemoglobin 13.3; Platelets 278; Potassium 4.3; Sodium 137  Recent Lipid Panel    Component Value Date/Time   CHOL 204 (H) 01/12/2021 0207   TRIG 53 01/12/2021 0207   HDL 62 01/12/2021 0207   CHOLHDL 3.3 01/12/2021 0207   VLDL 11 01/12/2021 0207   LDLCALC 131 (H) 01/12/2021 0207          Physical Exam:    VS:  BP 102/60   Pulse 67   Ht 5\' 7"  (1.702 m)   Wt 142 lb 3.2 oz (64.5 kg)   SpO2 98%   BMI 22.27 kg/m     Wt Readings from Last 3 Encounters:  05/01/21 142 lb 3.2 oz (64.5 kg)  04/29/21 145 lb (65.8 kg)  02/19/21 147 lb 3.2 oz (66.8 kg)     GEN:  Well nourished, well developed in no acute distress HEENT: Normal NECK: No JVD; No carotid bruits LYMPHATICS: No lymphadenopathy CARDIAC: RRR, no murmurs, rubs, gallops RESPIRATORY:  Clear to auscultation without rales, wheezing or rhonchi  ABDOMEN: Soft, non-tender, non-distended MUSCULOSKELETAL:  No edema; No deformity  SKIN: Warm and dry NEUROLOGIC:  Alert and oriented x 3 PSYCHIATRIC:  Normal affect   ASSESSMENT:    No diagnosis found. PLAN:    In order of problems listed above:  #History of NICM with Recovered EF: TTE 03/2020 with LVEF 65-70%. Euvolemic on exam with NYHA class ** symptoms. -Continue metop tartrate 25mg  BID -Continue lisinopril 5mg  daily  #History of PVCs: Followed by Dr. Lovena Le. Doing well. -Continue metop 25mg  BID with additional dose as needed  #Paroxysmal Afib: CHADs-vasc 5. S/p MAZE during MVR. On warfarin for [facility] given mechanical MVR. -Continue  warfarin  -Continue metop tartrate 25mg  BID  #Severe MR s/p mechanical MVR: Last TTE with mean gradient 5mmHg. Doing well with no HF or anginal symptoms -Continue warfarin for AC -Needs SBE ppx -Continue serial monitoring with echoes  #HLD: #Statin intolerance Off statins due to myalgias and does not want to resume. Amenable to starting zetia. -Start zetia 10mg  daily -Can consider PCKS9i if patient amenable  #History of CVA: -Not on ASA due to warfarin  -Amenable to starting zetia; has been intolerant to statins -Can consider PCKS9i if patient amenable  FOLLOW UP IN 6 months  Medication Adjustments/Labs and Tests Ordered: Current medicines are reviewed at length with the patient today.  Concerns regarding medicines are outlined above.  No orders of the defined types were placed in this encounter.  Meds ordered this encounter  Medications   ezetimibe (ZETIA) 10 MG tablet    Sig: Take 1 tablet (10 mg total) by mouth daily.    Dispense:  90 tablet    Refill:  1    Patient Instructions  Medication Instructions:   STOP TAKING ROSUVASTATIN (CRESTOR) NOW  START TAKING ZETIA 10 MG BY MOUTH DAILY  *If you need a refill on your cardiac medications before your next appointment, please call your pharmacy*   Follow-Up: At [facility], you and your health needs are our priority.  As part of our continuing mission to provide you with exceptional heart care, we have created designated Provider Care Teams.  These Care Teams include your primary Cardiologist (physician) and Advanced Practice Providers (APPs -  Physician  Assistants and Nurse Practitioners) who all work together to provide you with the care you need, when you need it.  We recommend signing up for the patient portal called "MyChart".  Sign up information is provided on this After Visit Summary.  MyChart is used to connect with patients for Virtual Visits (Telemedicine).  Patients are able to view lab/test results,  encounter notes, upcoming appointments, etc.  Non-urgent messages can be sent to your provider as well.   To learn more about what you can do with MyChart, go to [email or web address].    Your next appointment:   6 month(s)  The format for your next appointment:   In Person  Provider:   Gwyndolyn Kaufman, MD     Ardell Isaacs as a scribe for Freada Bergeron, MD.,have documented all relevant documentation on the behalf of Freada Bergeron, MD,as directed by  Freada Bergeron, MD while in the presence of Freada Bergeron, MD.  I, Freada Bergeron, MD, have reviewed all documentation for this visit. The documentation on 05/01/21 for the exam, diagnosis, procedures, and orders are all accurate and complete.  Signed, Freada Bergeron, MD  05/01/2021 3:22 PM    [facility]

## 2021-05-01 ENCOUNTER — Ambulatory Visit (INDEPENDENT_AMBULATORY_CARE_PROVIDER_SITE_OTHER): Payer: Medicare Other | Admitting: Cardiology

## 2021-05-01 ENCOUNTER — Other Ambulatory Visit: Payer: Self-pay

## 2021-05-01 ENCOUNTER — Ambulatory Visit (INDEPENDENT_AMBULATORY_CARE_PROVIDER_SITE_OTHER): Payer: Medicare Other | Admitting: *Deleted

## 2021-05-01 ENCOUNTER — Encounter: Payer: Self-pay | Admitting: Cardiology

## 2021-05-01 VITALS — BP 102/60 | HR 67 | Ht 67.0 in | Wt 142.2 lb

## 2021-05-01 DIAGNOSIS — I110 Hypertensive heart disease with heart failure: Secondary | ICD-10-CM | POA: Diagnosis not present

## 2021-05-01 DIAGNOSIS — Z9889 Other specified postprocedural states: Secondary | ICD-10-CM

## 2021-05-01 DIAGNOSIS — I480 Paroxysmal atrial fibrillation: Secondary | ICD-10-CM

## 2021-05-01 DIAGNOSIS — Z952 Presence of prosthetic heart valve: Secondary | ICD-10-CM

## 2021-05-01 DIAGNOSIS — I639 Cerebral infarction, unspecified: Secondary | ICD-10-CM | POA: Diagnosis not present

## 2021-05-01 DIAGNOSIS — I429 Cardiomyopathy, unspecified: Secondary | ICD-10-CM | POA: Diagnosis not present

## 2021-05-01 DIAGNOSIS — Z789 Other specified health status: Secondary | ICD-10-CM

## 2021-05-01 DIAGNOSIS — I493 Ventricular premature depolarization: Secondary | ICD-10-CM

## 2021-05-01 DIAGNOSIS — M436 Torticollis: Secondary | ICD-10-CM | POA: Diagnosis not present

## 2021-05-01 DIAGNOSIS — F419 Anxiety disorder, unspecified: Secondary | ICD-10-CM | POA: Diagnosis not present

## 2021-05-01 DIAGNOSIS — K5901 Slow transit constipation: Secondary | ICD-10-CM | POA: Diagnosis not present

## 2021-05-01 DIAGNOSIS — Z79899 Other long term (current) drug therapy: Secondary | ICD-10-CM | POA: Diagnosis not present

## 2021-05-01 DIAGNOSIS — I509 Heart failure, unspecified: Secondary | ICD-10-CM

## 2021-05-01 DIAGNOSIS — Z8679 Personal history of other diseases of the circulatory system: Secondary | ICD-10-CM

## 2021-05-01 DIAGNOSIS — M170 Bilateral primary osteoarthritis of knee: Secondary | ICD-10-CM | POA: Diagnosis not present

## 2021-05-01 DIAGNOSIS — I440 Atrioventricular block, first degree: Secondary | ICD-10-CM | POA: Diagnosis not present

## 2021-05-01 DIAGNOSIS — Z9181 History of falling: Secondary | ICD-10-CM | POA: Diagnosis not present

## 2021-05-01 DIAGNOSIS — G35 Multiple sclerosis: Secondary | ICD-10-CM | POA: Diagnosis not present

## 2021-05-01 DIAGNOSIS — R296 Repeated falls: Secondary | ICD-10-CM | POA: Diagnosis not present

## 2021-05-01 DIAGNOSIS — I428 Other cardiomyopathies: Secondary | ICD-10-CM | POA: Diagnosis not present

## 2021-05-01 DIAGNOSIS — I69351 Hemiplegia and hemiparesis following cerebral infarction affecting right dominant side: Secondary | ICD-10-CM | POA: Diagnosis not present

## 2021-05-01 DIAGNOSIS — K589 Irritable bowel syndrome without diarrhea: Secondary | ICD-10-CM | POA: Diagnosis not present

## 2021-05-01 DIAGNOSIS — M810 Age-related osteoporosis without current pathological fracture: Secondary | ICD-10-CM | POA: Diagnosis not present

## 2021-05-01 DIAGNOSIS — E785 Hyperlipidemia, unspecified: Secondary | ICD-10-CM | POA: Diagnosis not present

## 2021-05-01 DIAGNOSIS — J439 Emphysema, unspecified: Secondary | ICD-10-CM | POA: Diagnosis not present

## 2021-05-01 DIAGNOSIS — N319 Neuromuscular dysfunction of bladder, unspecified: Secondary | ICD-10-CM | POA: Diagnosis not present

## 2021-05-01 DIAGNOSIS — I272 Pulmonary hypertension, unspecified: Secondary | ICD-10-CM | POA: Diagnosis not present

## 2021-05-01 DIAGNOSIS — Z5181 Encounter for therapeutic drug level monitoring: Secondary | ICD-10-CM

## 2021-05-01 DIAGNOSIS — I5030 Unspecified diastolic (congestive) heart failure: Secondary | ICD-10-CM | POA: Diagnosis not present

## 2021-05-01 DIAGNOSIS — G4700 Insomnia, unspecified: Secondary | ICD-10-CM | POA: Diagnosis not present

## 2021-05-01 DIAGNOSIS — Z7901 Long term (current) use of anticoagulants: Secondary | ICD-10-CM | POA: Diagnosis not present

## 2021-05-01 DIAGNOSIS — R42 Dizziness and giddiness: Secondary | ICD-10-CM | POA: Diagnosis not present

## 2021-05-01 DIAGNOSIS — E039 Hypothyroidism, unspecified: Secondary | ICD-10-CM | POA: Diagnosis not present

## 2021-05-01 LAB — POCT INR: INR: 2.3 (ref 2.0–3.0)

## 2021-05-01 MED ORDER — EZETIMIBE 10 MG PO TABS: 10.0000 mg | ORAL_TABLET | Freq: Every day | ORAL | 1 refills | Status: DC

## 2021-05-01 NOTE — Patient Instructions (Signed)
Description   Today take 2 tablets then start taking warfarin 1 tablet daily except for 1.5 tablets on Sundays, Tuesdays, and Thursdays. Recheck INR in 2 weeks. Be consistent with your vitamin K foods. [facility] [phone].

## 2021-05-01 NOTE — Patient Instructions (Signed)
Medication Instructions:   STOP TAKING ROSUVASTATIN (CRESTOR) NOW  START TAKING ZETIA 10 MG BY MOUTH DAILY  *If you need a refill on your cardiac medications before your next appointment, please call your pharmacy*   Follow-Up: At [facility], you and your health needs are our priority.  As part of our continuing mission to provide you with exceptional heart care, we have created designated Provider Care Teams.  These Care Teams include your primary Cardiologist (physician) and Advanced Practice Providers (APPs -  Physician Assistants and Nurse Practitioners) who all work together to provide you with the care you need, when you need it.  We recommend signing up for the patient portal called "MyChart".  Sign up information is provided on this After Visit Summary.  MyChart is used to connect with patients for Virtual Visits (Telemedicine).  Patients are able to view lab/test results, encounter notes, upcoming appointments, etc.  Non-urgent messages can be sent to your provider as well.   To learn more about what you can do with MyChart, go to [email or web address].    Your next appointment:   6 month(s)  The format for your next appointment:   In Person  Provider:   Gwyndolyn Kaufman, MD

## 2021-05-06 DIAGNOSIS — J439 Emphysema, unspecified: Secondary | ICD-10-CM | POA: Diagnosis not present

## 2021-05-06 DIAGNOSIS — I429 Cardiomyopathy, unspecified: Secondary | ICD-10-CM | POA: Diagnosis not present

## 2021-05-06 DIAGNOSIS — G35 Multiple sclerosis: Secondary | ICD-10-CM | POA: Diagnosis not present

## 2021-05-06 DIAGNOSIS — I69351 Hemiplegia and hemiparesis following cerebral infarction affecting right dominant side: Secondary | ICD-10-CM | POA: Diagnosis not present

## 2021-05-06 DIAGNOSIS — I110 Hypertensive heart disease with heart failure: Secondary | ICD-10-CM | POA: Diagnosis not present

## 2021-05-06 DIAGNOSIS — I5030 Unspecified diastolic (congestive) heart failure: Secondary | ICD-10-CM | POA: Diagnosis not present

## 2021-05-13 ENCOUNTER — Telehealth: Payer: Self-pay | Admitting: Family Medicine

## 2021-05-13 DIAGNOSIS — R2689 Other abnormalities of gait and mobility: Secondary | ICD-10-CM

## 2021-05-13 DIAGNOSIS — R531 Weakness: Secondary | ICD-10-CM

## 2021-05-13 DIAGNOSIS — J439 Emphysema, unspecified: Secondary | ICD-10-CM | POA: Diagnosis not present

## 2021-05-13 DIAGNOSIS — I69351 Hemiplegia and hemiparesis following cerebral infarction affecting right dominant side: Secondary | ICD-10-CM | POA: Diagnosis not present

## 2021-05-13 DIAGNOSIS — G35 Multiple sclerosis: Secondary | ICD-10-CM | POA: Diagnosis not present

## 2021-05-13 DIAGNOSIS — R269 Unspecified abnormalities of gait and mobility: Secondary | ICD-10-CM

## 2021-05-13 DIAGNOSIS — I5030 Unspecified diastolic (congestive) heart failure: Secondary | ICD-10-CM | POA: Diagnosis not present

## 2021-05-13 DIAGNOSIS — I429 Cardiomyopathy, unspecified: Secondary | ICD-10-CM | POA: Diagnosis not present

## 2021-05-13 DIAGNOSIS — I110 Hypertensive heart disease with heart failure: Secondary | ICD-10-CM | POA: Diagnosis not present

## 2021-05-13 NOTE — Telephone Encounter (Signed)
[name] [name] with [facility] called requesting orders for outpatient PT for balance, gait and strength. Please send to [place] location, fax# [phone].

## 2021-05-13 NOTE — Telephone Encounter (Signed)
Okay to place requested physical therapy orders at location desired.

## 2021-05-13 NOTE — Telephone Encounter (Signed)
Pt last seen 04/29/21. Please advise

## 2021-05-14 ENCOUNTER — Ambulatory Visit (INDEPENDENT_AMBULATORY_CARE_PROVIDER_SITE_OTHER): Payer: Medicare Other | Admitting: *Deleted

## 2021-05-14 DIAGNOSIS — Z0000 Encounter for general adult medical examination without abnormal findings: Secondary | ICD-10-CM

## 2021-05-14 NOTE — Progress Notes (Signed)
Subjective:   [name] [name] is a 70 y.o. female who presents for Medicare Annual (Subsequent) preventive examination.  I connected with  [name] on 05/14/21 by a telephone enabled telemedicine application and verified that I am speaking with the correct person using two identifiers.   I discussed the limitations of evaluation and management by telemedicine. The patient expressed understanding and agreed to proceed.   Review of Systems    NA Cardiac Risk Factors include: advanced age (>48mn, >>70women)     Objective:    Today's Vitals   There is no height or weight on file to calculate BMI.  Advanced Directives 05/14/2021 01/13/2021 01/12/2021 01/11/2021 12/19/2020 12/18/2019 06/21/2019  Does Patient Have a Medical Advance Directive? Yes No Yes No No No Yes  [place]  Does patient want to make changes to medical advance directive? - - No - Patient declined - - - No - Patient declined  Copy of HMariannain Chart? No - copy requested - No - copy requested - - - No - copy requested  Would patient like information on creating a medical advance directive? - No - Patient declined No - Patient declined No - Patient declined - - -  Pre-existing out of facility DNR order (yellow form or pink MOST form) - - - - - - -    Current Medications (verified) Outpatient Encounter Medications as of 05/14/2021  Medication Sig   acetaminophen (TYLENOL) 325 MG tablet Take 2 tablets (650 mg total) by mouth every 4 (four) hours as needed for mild pain (or temp > 37.5 C (99.5 F)).   ARMOUR THYROID 90 MG tablet Take 1 tablet (90 mg total) by mouth daily.   Biotin 5000 MCG CAPS Take 10,000 mcg by mouth in the morning and at bedtime.   Black Cohosh 40 MG CAPS Take 40 mg by mouth every evening.   Bromelains (BROMELAIN PO) Take 1 capsule by mouth 2 (two)  times daily.   calcium carbonate (OSCAL) 1500 (600 Ca) MG TABS tablet Take 1,500 mg by mouth daily. Takes 1 time daily   Cholecalciferol (VITAMIN D3) 2000 units capsule Take 4,000 Units by mouth daily.   Coenzyme Q10 (CO Q-10) 100 MG CAPS Take 1 capsule by mouth daily.    Cranberry 500 MG CAPS Take 1 capsule by mouth daily.   ezetimibe (ZETIA) 10 MG tablet Take 1 tablet (10 mg total) by mouth daily.   fluticasone (FLONASE) 50 MCG/ACT nasal spray Place 1 spray into both nostrils daily.   L-THEANINE PO Take 1 capsule by mouth daily.    lactose free nutrition (BOOST PLUS) LIQD Take 237 mLs by mouth daily.    lidocaine-prilocaine (EMLA) cream Apply 1 application topically as needed (per pt this is for use before blood draws).    lisinopril (ZESTRIL) 5 MG tablet Take 1 tablet (5 mg total) by mouth daily.   Lysine 500 MG CAPS Take 1 capsule by mouth daily.   Magnesium Citrate 200 MG TABS Take 200 mg by mouth daily.   meclizine (ANTIVERT) 12.5 MG tablet Take 1 tablet (12.5 mg total) by mouth 2 (two) times daily as needed for dizziness.   metoprolol tartrate (LOPRESSOR) 25 MG tablet TAKE 1 TAB (25 MG) PO TWICE DAILY - pt must make appt with provider for further refills - 1st attempt   MILK THISTLE  PO Take 1 capsule by mouth daily.   Misc Natural Products (GLUCOSAMINE CHOND COMPLEX/MSM PO) Take 1 tablet by mouth in the morning and at bedtime. Strength of dose Glucosamine '1500mg'$  /Chodroitron '1000mg'$ / MSM '500mg'$  takes one twice daily   Olive Leaf 500 MG CAPS Take 1 capsule by mouth 2 (two) times daily.    OVER THE COUNTER MEDICATION cholestaid   Petasin (PETADOLEX PO) Take 1 tablet by mouth 2 (two) times daily.   Probiotic Product (PROBIOTIC DAILY PO) Take 1 capsule by mouth daily. Take 1 capsule once a day   PROGESTERONE MICRONIZED PO Take 100 mg by mouth daily.    Pumpkin Seed 500 MG TABS Take 1 tablet by mouth 2 (two) times daily.   TURMERIC PO Take 1 capsule by mouth daily.   vitamin A 10000 UNIT  capsule Take 10,000 Units by mouth daily.   vitamin B-12 (CYANOCOBALAMIN) 1000 MCG tablet Take 1,000 mcg by mouth daily. Taking sublingal   vitamin E 400 UNIT capsule Take 400 Units by mouth daily.   warfarin (COUMADIN) 5 MG tablet Take 1 tablet (5 mg total) by mouth daily.   No facility-administered encounter medications on file as of 05/14/2021.    Allergies (verified) Erythromycin, Atorvastatin, and Valsartan   History: Past Medical History:  Diagnosis Date   Anxiety    Arthritis    knees   Breast cancer ([place]) 1999   CHF (congestive heart failure) ([place])    Related to severe mitral regurgitation, April, 2013   COPD (chronic obstructive pulmonary disease) ([place])    COPD with emphysema.. Assess by pulmonary team in the hospital April, 2013   Ejection fraction    EF 60%, echo, April, 2013, with severe MR before mitral valve replacement   Herpes    Hypothyroidism    IBS (irritable bowel syndrome)    Mitral valve regurgitation    Mitral valve replacement April, 2013, Mitral valve prolapse   Multiple sclerosis ([place])    Neurogenic bladder    Osteoporosis    Ovarian cyst    Paroxysmal atrial fibrillation (HCC)    Rapid atrial fibrillation in-hospital, Rapid cardioversion,  before mitral valve surgery   Pulmonary hypertension ([place])    Echo, April, 2013, before mitral valve surgery   S/P Maze operation for atrial fibrillation 01/26/2012   Complete biatrial lesion set using cryothermy via right mini thoracotomy   S/P mitral valve replacement 01/26/2012   31m Sorin [employer] Optiform mechanical prosthesis via right mini thoracotomy   Warfarin anticoagulation    Mechanical mitral prosthesis, April, 20136   Past Surgical History:  Procedure Laterality Date   BREAST LUMPECTOMY Right 1999   with sent.node, and axillary dissection (20)   CHEST TUBE INSERTION  01/26/2012   Procedure: CHEST TUBE INSERTION;  Surgeon: CRexene Alberts MD;  [facility]  Service: Open Heart Surgery;   Laterality: Left;   COLONOSCOPY  2010   "normal"   [place]  ureth[place] strict[place] repair   LEFT AND RIGHT HEART CATHETERIZATION WITH CORONARY ANGIOGRAM N/A 01/20/2012   Procedure: LEFT AND RIGHT HEART CATHETERIZATION WITH CORONARY ANGIOGRAM;  Surgeon: CBurnell Blanks MD;  [facility];  Service: Cardiovascular;  Laterality: N/A;   LYMPHADENECTOMY     MAZE  01/26/2012   Procedure: MAZE;  Surgeon: CRexene Alberts MD;  [place] [facility]  Service: Open Heart Surgery;  Laterality: N/A;   MITRAL VALVE REPLACEMENT  01/26/2012   Procedure: MINIMALLY  INVASIVE MITRAL VALVE (MV) REPLACEMENT;  Surgeon: Rexene Alberts, MD;  Location: [place];  Service: Open Heart Surgery;  Laterality: Right;   TEE WITHOUT CARDIOVERSION  01/19/2012   Procedure: TRANSESOPHAGEAL ECHOCARDIOGRAM (TEE);  Surgeon: Peter M Martinique, MD;  [facility];  Service: Cardiovascular;  Laterality: N/A;   TONSILLECTOMY  [id number]   UMBILICAL HERNIA REPAIR  1952   WRIST SURGERY Right 2012   Family History  Problem Relation Age of Onset   Heart disease Father        cardiac arrest    CAD Father    Hypertension Father    CAD Mother        5 stents and numerous bypass surgery   Hypertension Mother    Hyperlipidemia Mother    CAD Other    Breast cancer Maternal Grandmother    Breast cancer Maternal Aunt    Social History   Socioeconomic History   Marital status: Widowed    Spouse name: Not on file   Number of children: 2   Years of education: 1   Highest education level: Not on file  Occupational History   Occupation: Retired  Tobacco Use   Smoking status: Never   Smokeless tobacco: Never  Vaping Use   Vaping Use: Never used  Substance and Sexual Activity   Alcohol use: No   Drug use: No   Sexual activity: Never    Birth control/protection: None  Other Topics Concern   Not on file  Social History Narrative   Patient is a widower (since 46), he  currently lives with her son and daughter-in-law. She has 2 children.   She is college educated, and retired from the [employer].   She uses herbal remedies, and multiple over-the-counter supplements. She takes a daily vitamin.   She wears her seatbelt, exercises routinely, smoke detector in the home.   Requires a walker or wheelchair at times.   Feels safe in her relationships.   Social Determinants of Health   Financial Resource Strain: Low Risk    Difficulty of Paying Living Expenses: Not hard at all  Food Insecurity: No Food Insecurity   Worried About Charity fundraiser in the Last Year: Never true   [place] in the Last Year: Never true  Transportation Needs: No Transportation Needs   Lack of Transportation (Medical): No   Lack of Transportation (Non-Medical): No  Physical Activity: Insufficiently Active   Days of Exercise per Week: 2 days   Minutes of Exercise per Session: 10 min  Stress: No Stress Concern Present   Feeling of Stress : Not at all  Social Connections: Socially Isolated   Frequency of Communication with Friends and Family: More than three times a week   Frequency of Social Gatherings with Friends and Family: Once a week   Attends Religious Services: Never   Marine scientist or Organizations: No   Attends Archivist Meetings: Never   Marital Status: Widowed    Tobacco Counseling Counseling given: Not Answered   Clinical Intake:     Pain : No/denies pain     Nutritional Risks: None Diabetes: No  How often do you need to have someone help you when you read instructions, pamphlets, or other written materials from your doctor or pharmacy?: 1 - Never  Diabetic?  no  Interpreter Needed?: No  Information entered by :: Leroy Kennedy LPN   Activities of Daily Living In your present state of health,  do you have any difficulty performing the following activities: 05/14/2021 01/13/2021  Hearing? N N  Vision? N N  Difficulty  concentrating or making decisions? N N  Walking or climbing stairs? Y Y  Dressing or bathing? N N  Doing errands, shopping? N N  Preparing Food and eating ? N -  Using the Toilet? N -  In the past six months, have you accidently leaked urine? N -  Managing your Medications? N -  Managing your Finances? N -  Housekeeping or managing your Housekeeping? N -  Some recent data might be hidden    Patient Care Team: Ma Hillock, DO as PCP - General (Family Medicine) Elsie Stain, MD as Attending Physician (Pulmonary Disease) Regal, Tamala Fothergill, DPM as Consulting Physician (Podiatry) Dorothy Spark, MD (Inactive) as Consulting Physician (Cardiology) Mingo Amber, MD (Alternative Medicine) Ditty, Kevan Ny, MD as Consulting Physician (Neurosurgery) Princess Bruins, MD as Consulting Physician (Obstetrics and Gynecology) Vinnie Level (Dentistry) Druscilla Brownie, MD as Consulting Physician (Dermatology) Pieter Partridge, DO as Consulting Physician (Neurology) Calvert Cantor, MD as Consulting Physician (Ophthalmology)  Indicate any recent Medical Services you may have received from other than [facility] providers in the past year (date may be approximate).     Assessment:   This is a routine wellness examination for [facility].  Hearing/Vision screen Hearing Screening - Comments:: No trouble hearing   Vision Screening - Comments:: Not up to date will call to schedule Dr. Bing Plume  Dietary issues and exercise activities discussed: Current Exercise Habits: Home exercise routine, Time (Minutes): 20, Frequency (Times/Week): 2, Weekly Exercise (Minutes/Week): 40, Intensity: Mild, Exercise limited by: orthopedic condition(s)   Goals Addressed             This Visit's Progress    Patient Stated       Would like to chiropractor To improve balance/better posture         Depression Screen PHQ 2/9 Scores 05/14/2021 02/06/2021 12/18/2019 03/28/2019 03/15/2018 09/18/2016 08/11/2016   PHQ - 2 Score 0 0 0 0 0 0 0    Fall Risk Fall Risk  05/14/2021 02/06/2021 12/19/2020 12/18/2019 03/28/2019  Falls in the past year? 0 0 0 0 1  Comment - - - - -  Number falls in past yr: 0 0 0 0 0  Comment - - - - -  Injury with Fall? 0 0 0 0 0  Risk Factor Category  - - - - -  Risk for fall due to : Impaired balance/gait - - - Impaired balance/gait;History of fall(s);Impaired mobility;Other (Comment)  Risk for fall due to: Comment - - - - MS dx   Follow up Falls evaluation completed;Falls prevention discussed - - - Falls evaluation completed;Education provided;Falls prevention discussed    FALL RISK PREVENTION PERTAINING TO THE HOME:  Any stairs in or around the home? Yes  If so, are there any without handrails? Yes  Home free of loose throw rugs in walkways, pet beds, electrical cords, etc? Yes  Adequate lighting in your home to reduce risk of falls? Yes   ASSISTIVE DEVICES UTILIZED TO PREVENT FALLS:  Life alert? Yes  Use of a cane, walker or w/c? Yes  Grab bars in the bathroom? Yes  Shower chair or bench in shower? Yes  Elevated toilet seat or a handicapped toilet? Yes   TIMED UP AND GO:  Was the test performed? No .    Cognitive Function:  Normal cognitive status assessed by direct observation by this  Nurse Health Advisor. No abnormalities found.          Immunizations Immunization History  Administered Date(s) Administered   Td 10/21/2003   Tdap 02/17/2011    TDAP status: Due, Education has been provided regarding the importance of this vaccine. Advised may receive this vaccine at local pharmacy or Health Dept. Aware to provide a copy of the vaccination record if obtained from local pharmacy or Health Dept. Verbalized acceptance and understanding.  Flu Vaccine status: Declined, Education has been provided regarding the importance of this vaccine but patient still declined. Advised may receive this vaccine at local pharmacy or Health Dept. Aware to provide a copy of  the vaccination record if obtained from local pharmacy or Health Dept. Verbalized acceptance and understanding.  Pneumococcal vaccine status: Declined,  Education has been provided regarding the importance of this vaccine but patient still declined. Advised may receive this vaccine at local pharmacy or Health Dept. Aware to provide a copy of the vaccination record if obtained from local pharmacy or Health Dept. Verbalized acceptance and understanding.   Covid-19 vaccine status: Information provided on how to obtain vaccines.   Qualifies for Shingles Vaccine? Yes   Zostavax completed No   Shingrix Completed?: No.    Education has been provided regarding the importance of this vaccine. Patient has been advised to call insurance company to determine out of pocket expense if they have not yet received this vaccine. Advised may also receive vaccine at local pharmacy or Health Dept. Verbalized acceptance and understanding.  Screening Tests Health Maintenance  Topic Date Due   COVID-19 Vaccine (1) Never done   Zoster Vaccines- Shingrix (1 of 2) Never done   TETANUS/TDAP  02/16/2021   INFLUENZA VACCINE  05/19/2021   MAMMOGRAM  07/08/2022   Fecal DNA (Cologuard)  10/23/2022   DEXA SCAN  Completed   Hepatitis C Screening  Completed   HPV VACCINES  Aged Out   PNA vac Low Risk Adult  Discontinued    Health Maintenance  Health Maintenance Due  Topic Date Due   COVID-19 Vaccine (1) Never done   Zoster Vaccines- Shingrix (1 of 2) Never done   TETANUS/TDAP  02/16/2021    Colorectal cancer screening: Type of screening: Cologuard. Completed 2020. Repeat every 3 years  Mammogram status: Completed 2021. Repeat every year  Bone Density status: Completed 2020. Results reflect: Bone density results: OSTEOPOROSIS. Repeat every 2 years.  Lung Cancer Screening: (Low Dose CT Chest recommended if Age 25-80 years, 30 pack-year currently smoking OR have quit w/in 15years.) does not qualify.   Lung Cancer  Screening Referral: na  Additional Screening:  Hepatitis C Screening: does not qualify; Completed   Vision Screening: Recommended annual ophthalmology exams for early detection of glaucoma and other disorders of the eye. Is the patient up to date with their annual eye exam?  Yes  Who is the provider or what is the name of the office in which the patient attends annual eye exams? Dr. Gilford Rile If pt is not established with a provider, would they like to be referred to a provider to establish care? No .   Dental Screening: Recommended annual dental exams for proper oral hygiene  Community Resource Referral / Chronic Care Management: CRR required this visit?  No   CCM required this visit?  No      Plan:     I have personally reviewed and noted the following in the patient's chart:   Medical and social history Use of alcohol,  tobacco or illicit drugs  Current medications and supplements including opioid prescriptions.  Functional ability and status Nutritional status Physical activity Advanced directives List of other physicians Hospitalizations, surgeries, and ER visits in previous 12 months Vitals Screenings to include cognitive, depression, and falls Referrals and appointments  In addition, I have reviewed and discussed with patient certain preventive protocols, quality metrics, and best practice recommendations. A written personalized care plan for preventive services as well as general preventive health recommendations were provided to patient.     Leroy Kennedy, LPN   [phone]   Nurse Notes: na

## 2021-05-14 NOTE — Addendum Note (Signed)
Addended by: Kavin Leech on: 05/14/2021 08:52 AM   Modules accepted: Orders

## 2021-05-14 NOTE — Patient Instructions (Signed)
Ms. [name] [name] , Thank you for taking time to come for your Medicare Wellness Visit. I appreciate your ongoing commitment to your health goals. Please review the following plan we discussed and let me know if I can assist you in the future.   Screening recommendations/referrals: Colonoscopy: up to date  Mammogram: up to date Bone Density: up to date Recommended yearly ophthalmology/optometry visit for glaucoma screening and checkup Recommended yearly dental visit for hygiene and checkup  Vaccinations: Influenza vaccine: Education provided Pneumococcal vaccine: Education provided Tdap vaccine: Education provided Shingles vaccine: Education provided    Advanced directives: copy requested   Conditions/risks identified: na     Preventive Care 72 Years and Older, Female Preventive care refers to lifestyle choices and visits with your health care provider that can promote health and wellness. What does preventive care include? A yearly physical exam. This is also called an annual well check. Dental exams once or twice a year. Routine eye exams. Ask your health care provider how often you should have your eyes checked. Personal lifestyle choices, including: Daily care of your teeth and gums. Regular physical activity. Eating a healthy diet. Avoiding tobacco and drug use. Limiting alcohol use. Practicing safe sex. Taking low-dose aspirin every day. Taking vitamin and mineral supplements as recommended by your health care provider. What happens during an annual well check? The services and screenings done by your health care provider during your annual well check will depend on your age, overall health, lifestyle risk factors, and family history of disease. Counseling  Your health care provider may ask you questions about your: Alcohol use. Tobacco use. Drug use. Emotional well-being. Home and relationship well-being. Sexual activity. Eating habits. History of falls. Memory and  ability to understand (cognition). Work and work Statistician. Reproductive health. Screening  You may have the following tests or measurements: Height, weight, and BMI. Blood pressure. Lipid and cholesterol levels. These may be checked every 5 years, or more frequently if you are over 72 years old. Skin check. Lung cancer screening. You may have this screening every year starting at age 43 if you have a 30-pack-year history of smoking and currently smoke or have quit within the past 15 years. Fecal occult blood test (FOBT) of the stool. You may have this test every year starting at age 9. Flexible sigmoidoscopy or colonoscopy. You may have a sigmoidoscopy every 5 years or a colonoscopy every 10 years starting at age 36. Hepatitis C blood test. Hepatitis B blood test. Sexually transmitted disease (STD) testing. Diabetes screening. This is done by checking your blood sugar (glucose) after you have not eaten for a while (fasting). You may have this done every 1-3 years. Bone density scan. This is done to screen for osteoporosis. You may have this done starting at age 39. Mammogram. This may be done every 1-2 years. Talk to your health care provider about how often you should have regular mammograms. Talk with your health care provider about your test results, treatment options, and if necessary, the need for more tests. Vaccines  Your health care provider may recommend certain vaccines, such as: Influenza vaccine. This is recommended every year. Tetanus, diphtheria, and acellular pertussis (Tdap, Td) vaccine. You may need a Td booster every 10 years. Zoster vaccine. You may need this after age 42. Pneumococcal 13-valent conjugate (PCV13) vaccine. One dose is recommended after age 60. Pneumococcal polysaccharide (PPSV23) vaccine. One dose is recommended after age 42. Talk to your health care provider about which screenings and vaccines you  need and how often you need them. This information is  not intended to replace advice given to you by your health care provider. Make sure you discuss any questions you have with your health care provider. Document Released: 11/01/2015 Document Revised: 06/24/2016 Document Reviewed: 08/06/2015 Elsevier Interactive Patient Education  2017 [employer] Prevention in the Home Falls can cause injuries. They can happen to people of all ages. There are many things you can do to make your home safe and to help prevent falls. What can I do on the outside of my home? Regularly fix the edges of walkways and driveways and fix any cracks. Remove anything that might make you trip as you walk through a door, such as a raised step or threshold. Trim any bushes or trees on the path to your home. Use bright outdoor lighting. Clear any walking paths of anything that might make someone trip, such as rocks or tools. Regularly check to see if handrails are loose or broken. Make sure that both sides of any steps have handrails. Any raised decks and porches should have guardrails on the edges. Have any leaves, snow, or ice cleared regularly. Use sand or salt on walking paths during winter. Clean up any spills in your garage right away. This includes oil or grease spills. What can I do in the bathroom? Use night lights. Install grab bars by the toilet and in the tub and shower. Do not use towel bars as grab bars. Use non-skid mats or decals in the tub or shower. If you need to sit down in the shower, use a plastic, non-slip stool. Keep the floor dry. Clean up any water that spills on the floor as soon as it happens. Remove soap buildup in the tub or shower regularly. Attach bath mats securely with double-sided non-slip rug tape. Do not have throw rugs and other things on the floor that can make you trip. What can I do in the bedroom? Use night lights. Make sure that you have a light by your bed that is easy to reach. Do not use any sheets or blankets that  are too big for your bed. They should not hang down onto the floor. Have a firm chair that has side arms. You can use this for support while you get dressed. Do not have throw rugs and other things on the floor that can make you trip. What can I do in the kitchen? Clean up any spills right away. Avoid walking on wet floors. Keep items that you use a lot in easy-to-reach places. If you need to reach something above you, use a strong step stool that has a grab bar. Keep electrical cords out of the way. Do not use floor polish or wax that makes floors slippery. If you must use wax, use non-skid floor wax. Do not have throw rugs and other things on the floor that can make you trip. What can I do with my stairs? Do not leave any items on the stairs. Make sure that there are handrails on both sides of the stairs and use them. Fix handrails that are broken or loose. Make sure that handrails are as long as the stairways. Check any carpeting to make sure that it is firmly attached to the stairs. Fix any carpet that is loose or worn. Avoid having throw rugs at the top or bottom of the stairs. If you do have throw rugs, attach them to the floor with carpet tape. Make sure that  you have a light switch at the top of the stairs and the bottom of the stairs. If you do not have them, ask someone to add them for you. What else can I do to help prevent falls? Wear shoes that: Do not have high heels. Have rubber bottoms. Are comfortable and fit you well. Are closed at the toe. Do not wear sandals. If you use a stepladder: Make sure that it is fully opened. Do not climb a closed stepladder. Make sure that both sides of the stepladder are locked into place. Ask someone to hold it for you, if possible. Clearly mark and make sure that you can see: Any grab bars or handrails. First and last steps. Where the edge of each step is. Use tools that help you move around (mobility aids) if they are needed. These  include: Canes. Walkers. Scooters. Crutches. Turn on the lights when you go into a dark area. Replace any light bulbs as soon as they burn out. Set up your furniture so you have a clear path. Avoid moving your furniture around. If any of your floors are uneven, fix them. If there are any pets around you, be aware of where they are. Review your medicines with your doctor. Some medicines can make you feel dizzy. This can increase your chance of falling. Ask your doctor what other things that you can do to help prevent falls. This information is not intended to replace advice given to you by your health care provider. Make sure you discuss any questions you have with your health care provider. Document Released: 08/01/2009 Document Revised: 03/12/2016 Document Reviewed: 11/09/2014 Elsevier Interactive Patient Education  2017 [employer].

## 2021-05-14 NOTE — Telephone Encounter (Signed)
Order placed electronically

## 2021-05-15 ENCOUNTER — Other Ambulatory Visit: Payer: Self-pay

## 2021-05-15 ENCOUNTER — Ambulatory Visit (INDEPENDENT_AMBULATORY_CARE_PROVIDER_SITE_OTHER): Payer: Medicare Other

## 2021-05-15 DIAGNOSIS — Z5181 Encounter for therapeutic drug level monitoring: Secondary | ICD-10-CM

## 2021-05-15 DIAGNOSIS — I480 Paroxysmal atrial fibrillation: Secondary | ICD-10-CM | POA: Diagnosis not present

## 2021-05-15 LAB — POCT INR: INR: 1.5 — AB (ref 2.0–3.0)

## 2021-05-15 NOTE — Patient Instructions (Addendum)
Description   Today take 2 tablets tonight and 1.5 tablets tomorrow. Then start taking 1.5 tablets daily and 1 tablet on Monday, Wednesday, and Friday.  Recheck INR in 11 days. Be consistent with your vitamin K foods. [facility] [phone].

## 2021-05-19 DIAGNOSIS — G35 Multiple sclerosis: Secondary | ICD-10-CM | POA: Diagnosis not present

## 2021-05-19 DIAGNOSIS — I69351 Hemiplegia and hemiparesis following cerebral infarction affecting right dominant side: Secondary | ICD-10-CM | POA: Diagnosis not present

## 2021-05-19 DIAGNOSIS — I110 Hypertensive heart disease with heart failure: Secondary | ICD-10-CM | POA: Diagnosis not present

## 2021-05-19 DIAGNOSIS — I429 Cardiomyopathy, unspecified: Secondary | ICD-10-CM | POA: Diagnosis not present

## 2021-05-19 DIAGNOSIS — I5030 Unspecified diastolic (congestive) heart failure: Secondary | ICD-10-CM | POA: Diagnosis not present

## 2021-05-19 DIAGNOSIS — J439 Emphysema, unspecified: Secondary | ICD-10-CM | POA: Diagnosis not present

## 2021-05-22 ENCOUNTER — Telehealth: Payer: Self-pay | Admitting: Family Medicine

## 2021-05-22 DIAGNOSIS — Z1231 Encounter for screening mammogram for malignant neoplasm of breast: Secondary | ICD-10-CM

## 2021-05-22 DIAGNOSIS — M810 Age-related osteoporosis without current pathological fracture: Secondary | ICD-10-CM

## 2021-05-22 NOTE — Telephone Encounter (Signed)
Pt needing bone density and MM screening orders faxed to [facility]. Fax # [phone]  Pt is scheduled for 9/27

## 2021-05-22 NOTE — Telephone Encounter (Signed)
Pt recently had MWV. Please advise.

## 2021-05-23 NOTE — Addendum Note (Signed)
Addended by: Kavin Leech on: 05/23/2021 02:06 PM   Modules accepted: Orders

## 2021-05-23 NOTE — Telephone Encounter (Signed)
Orders placed.

## 2021-05-23 NOTE — Telephone Encounter (Signed)
Please place orders for her dexa and mammogram aty location of choice.  DX: osteoporosis history and breast cancer screen

## 2021-05-26 ENCOUNTER — Ambulatory Visit (INDEPENDENT_AMBULATORY_CARE_PROVIDER_SITE_OTHER): Payer: Medicare Other

## 2021-05-26 ENCOUNTER — Other Ambulatory Visit: Payer: Self-pay

## 2021-05-26 DIAGNOSIS — Z8679 Personal history of other diseases of the circulatory system: Secondary | ICD-10-CM

## 2021-05-26 DIAGNOSIS — I509 Heart failure, unspecified: Secondary | ICD-10-CM | POA: Diagnosis not present

## 2021-05-26 DIAGNOSIS — Z5181 Encounter for therapeutic drug level monitoring: Secondary | ICD-10-CM | POA: Diagnosis not present

## 2021-05-26 DIAGNOSIS — I480 Paroxysmal atrial fibrillation: Secondary | ICD-10-CM

## 2021-05-26 DIAGNOSIS — Z952 Presence of prosthetic heart valve: Secondary | ICD-10-CM

## 2021-05-26 DIAGNOSIS — Z9889 Other specified postprocedural states: Secondary | ICD-10-CM

## 2021-05-26 LAB — POCT INR: INR: 2.6 (ref 2.0–3.0)

## 2021-05-26 NOTE — Patient Instructions (Signed)
-   continue taking 1.5 tablets daily and 1 tablet on Monday, Wednesday, and Friday.   - Recheck INR in 2 weeks Be consistent with your vitamin K foods. [facility] [phone].

## 2021-06-09 ENCOUNTER — Ambulatory Visit (INDEPENDENT_AMBULATORY_CARE_PROVIDER_SITE_OTHER): Payer: Medicare Other

## 2021-06-09 ENCOUNTER — Other Ambulatory Visit: Payer: Self-pay

## 2021-06-09 DIAGNOSIS — Z5181 Encounter for therapeutic drug level monitoring: Secondary | ICD-10-CM | POA: Diagnosis not present

## 2021-06-09 DIAGNOSIS — I509 Heart failure, unspecified: Secondary | ICD-10-CM | POA: Diagnosis not present

## 2021-06-09 DIAGNOSIS — Z8679 Personal history of other diseases of the circulatory system: Secondary | ICD-10-CM | POA: Diagnosis not present

## 2021-06-09 DIAGNOSIS — Z9889 Other specified postprocedural states: Secondary | ICD-10-CM

## 2021-06-09 DIAGNOSIS — I480 Paroxysmal atrial fibrillation: Secondary | ICD-10-CM

## 2021-06-09 DIAGNOSIS — Z952 Presence of prosthetic heart valve: Secondary | ICD-10-CM | POA: Diagnosis not present

## 2021-06-09 LAB — POCT INR: INR: 4 — AB (ref 2.0–3.0)

## 2021-06-09 NOTE — Patient Instructions (Signed)
-   skip warfarin tonight, then  - START NEW DOSAGE of warfarin 1 tablets daily except for 1.5 tablets on TUESDAYS, THURSDAYS and Sunday.   - Recheck INR in 3 weeks Be consistent with your vitamin K foods. [facility] [phone].

## 2021-07-03 ENCOUNTER — Other Ambulatory Visit: Payer: Self-pay

## 2021-07-03 ENCOUNTER — Ambulatory Visit (INDEPENDENT_AMBULATORY_CARE_PROVIDER_SITE_OTHER): Payer: Medicare Other

## 2021-07-03 DIAGNOSIS — Z5181 Encounter for therapeutic drug level monitoring: Secondary | ICD-10-CM | POA: Diagnosis not present

## 2021-07-03 DIAGNOSIS — I480 Paroxysmal atrial fibrillation: Secondary | ICD-10-CM | POA: Diagnosis not present

## 2021-07-03 LAB — POCT INR: INR: 2.6 (ref 2.0–3.0)

## 2021-07-03 NOTE — Patient Instructions (Addendum)
Description   Continue warfarin 1 tablets daily except for 1.5 tablets on SUNDAYS, TUESDAYS, and THURSDAYS. Recheck INR in 4 weeks Be consistent with your vitamin K foods. [facility] [phone].

## 2021-07-08 DIAGNOSIS — E039 Hypothyroidism, unspecified: Secondary | ICD-10-CM | POA: Diagnosis not present

## 2021-08-07 ENCOUNTER — Other Ambulatory Visit: Payer: Self-pay

## 2021-08-07 ENCOUNTER — Ambulatory Visit (INDEPENDENT_AMBULATORY_CARE_PROVIDER_SITE_OTHER): Payer: Medicare Other | Admitting: *Deleted

## 2021-08-07 DIAGNOSIS — Z780 Asymptomatic menopausal state: Secondary | ICD-10-CM | POA: Diagnosis not present

## 2021-08-07 DIAGNOSIS — I480 Paroxysmal atrial fibrillation: Secondary | ICD-10-CM

## 2021-08-07 DIAGNOSIS — Z5181 Encounter for therapeutic drug level monitoring: Secondary | ICD-10-CM

## 2021-08-07 DIAGNOSIS — M85852 Other specified disorders of bone density and structure, left thigh: Secondary | ICD-10-CM | POA: Diagnosis not present

## 2021-08-07 DIAGNOSIS — Z1231 Encounter for screening mammogram for malignant neoplasm of breast: Secondary | ICD-10-CM | POA: Diagnosis not present

## 2021-08-07 DIAGNOSIS — M810 Age-related osteoporosis without current pathological fracture: Secondary | ICD-10-CM | POA: Diagnosis not present

## 2021-08-07 LAB — POCT INR: INR: 2.6 (ref 2.0–3.0)

## 2021-08-07 LAB — HM MAMMOGRAPHY

## 2021-08-07 LAB — HM DEXA SCAN

## 2021-08-07 NOTE — Patient Instructions (Addendum)
Description   Continue warfarin 1 tablets daily except for 1.5 tablets on SUNDAYS, TUESDAYS, and THURSDAYS. Recheck INR in 5 weeks Be consistent with your vitamin K foods. [facility] [phone].

## 2021-08-08 ENCOUNTER — Telehealth: Payer: Self-pay

## 2021-08-08 NOTE — Telephone Encounter (Signed)
Spoke with pt regarding labs and instructions.   

## 2021-08-08 NOTE — Telephone Encounter (Signed)
LVM for pt to CB regarding results.  

## 2021-08-08 NOTE — Telephone Encounter (Signed)
Please inform patient Mammogram is normal Bone density showed increase in bone density in her spine and left hip, but mildly decrease in bone density in her right hip.  She will in the osteoporotic range but overall average bone density is still about the same at -3.4, was -3.3----2 years ago.  Continue vitamin D and calcium supplements as she has been

## 2021-08-08 NOTE — Telephone Encounter (Signed)
Results received from [facility]: Dexa- osteoporosis  Mammogram- BI-RADS Category 2: Benign

## 2021-08-14 ENCOUNTER — Other Ambulatory Visit: Payer: Self-pay

## 2021-08-14 ENCOUNTER — Encounter: Payer: Self-pay | Admitting: Sports Medicine

## 2021-08-14 ENCOUNTER — Ambulatory Visit (INDEPENDENT_AMBULATORY_CARE_PROVIDER_SITE_OTHER): Payer: Medicare Other | Admitting: Sports Medicine

## 2021-08-14 DIAGNOSIS — L84 Corns and callosities: Secondary | ICD-10-CM | POA: Diagnosis not present

## 2021-08-14 DIAGNOSIS — I739 Peripheral vascular disease, unspecified: Secondary | ICD-10-CM

## 2021-08-14 DIAGNOSIS — M79672 Pain in left foot: Secondary | ICD-10-CM

## 2021-08-14 DIAGNOSIS — M79671 Pain in right foot: Secondary | ICD-10-CM

## 2021-08-14 DIAGNOSIS — M2040 Other hammer toe(s) (acquired), unspecified foot: Secondary | ICD-10-CM

## 2021-08-14 DIAGNOSIS — B351 Tinea unguium: Secondary | ICD-10-CM

## 2021-08-14 DIAGNOSIS — M79676 Pain in unspecified toe(s): Secondary | ICD-10-CM

## 2021-08-14 NOTE — Progress Notes (Signed)
Subjective: [name] [name] is a 70 y.o. female patient seen today in office with complaint of mildly painful thickened and elongated toenails and callus to toes; unable to trim. Patient reports that there is some soreness at the left third toe callus.  States that she is also interested in getting new orthotics and is wondering if she needs to go up in her shoe size.  Patient denies any other pedal complaints at this time.  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   CVA (cerebral vascular accident) ([place]) 01/11/2021   Right sided weakness    PVCs (premature ventricular contractions) 03/30/2020   Osteoporosis 07/05/2019   Nonischemic cardiomyopathy ([place]) 07/20/2018   Scoliosis 11/20/2016   Long term current use of anticoagulant 09/18/2016   Compression fracture of lumbar spine, non-traumatic, sequela 08/11/2016   At high risk for injury related to fall 08/11/2016   Encounter for therapeutic drug monitoring 11/27/2013   Hypothyroidism    CHF (congestive heart failure) (HCC)    Paroxysmal atrial fibrillation (HCC)    Arthritis    Neurogenic bladder    Warfarin anticoagulation    First degree heart block 01/29/2012   S/P mitral valve replacement 01/26/2012   S/P Maze operation for atrial fibrillation 01/26/2012   Anxiety 01/18/2012   Multiple sclerosis ([place]) 01/19/2007    Current Outpatient Medications on File Prior to Visit  Medication Sig Dispense Refill   acetaminophen (TYLENOL) 325 MG tablet Take 2 tablets (650 mg total) by mouth every 4 (four) hours as needed for mild pain (or temp > 37.5  C (99.5 F)).     ARMOUR THYROID 90 MG tablet Take 1 tablet (90 mg total) by mouth daily. 30 tablet 0   Biotin 5000 MCG CAPS Take 10,000 mcg by mouth in the morning and at bedtime.     Black Cohosh 40 MG CAPS Take 40 mg by mouth every evening.     Bromelains (BROMELAIN PO) Take 1 capsule by mouth 2 (two) times daily.     calcium carbonate (OSCAL) 1500 (600 Ca) MG TABS tablet Take 1,500 mg by mouth daily. Takes 1 time daily     Cholecalciferol (VITAMIN D3) 2000 units capsule Take 4,000 Units by mouth daily.     Coenzyme Q10 (CO Q-10) 100 MG CAPS Take 1 capsule by mouth daily.      Cranberry 500 MG CAPS Take 1 capsule by mouth daily.     ezetimibe (ZETIA) 10 MG tablet Take 1 tablet (10 mg total) by mouth daily. 90 tablet 1   fluticasone (FLONASE) 50 MCG/ACT nasal spray Place 1 spray into both nostrils daily. 16 g 6   L-THEANINE PO Take 1 capsule by mouth daily.      lactose free nutrition (BOOST PLUS) LIQD Take 237 mLs by mouth daily.      lidocaine-prilocaine (EMLA) cream Apply 1 application topically as needed (per pt this is for use before blood draws).   0   lisinopril (ZESTRIL) 5 MG tablet Take 1 tablet (5 mg total) by mouth daily. 90 tablet  1   Lysine 500 MG CAPS Take 1 capsule by mouth daily.     Magnesium Citrate 200 MG TABS Take 200 mg by mouth daily.     meclizine (ANTIVERT) 12.5 MG tablet Take 1 tablet (12.5 mg total) by mouth 2 (two) times daily as needed for dizziness. 30 tablet 0   metoprolol tartrate (LOPRESSOR) 25 MG tablet TAKE 1 TAB (25 MG) PO TWICE DAILY - pt must make appt with provider for further refills - 1st attempt 180 tablet 3   MILK THISTLE PO Take 1 capsule by mouth daily.     Misc Natural Products (GLUCOSAMINE CHOND COMPLEX/MSM PO) Take 1 tablet by mouth in the morning and at bedtime. Strength of dose Glucosamine 1500mg  /Chodroitron 1000mg / MSM 500mg  takes one twice daily     Olive Leaf 500 MG CAPS Take 1 capsule by mouth 2 (two) times daily.      OVER THE COUNTER  MEDICATION cholestaid     Petasin (PETADOLEX PO) Take 1 tablet by mouth 2 (two) times daily.     Probiotic Product (PROBIOTIC DAILY PO) Take 1 capsule by mouth daily. Take 1 capsule once a day     PROGESTERONE MICRONIZED PO Take 100 mg by mouth daily.      Pumpkin Seed 500 MG TABS Take 1 tablet by mouth 2 (two) times daily.     TURMERIC PO Take 1 capsule by mouth daily.     vitamin A 10000 UNIT capsule Take 10,000 Units by mouth daily.     vitamin B-12 (CYANOCOBALAMIN) 1000 MCG tablet Take 1,000 mcg by mouth daily. Taking sublingal     vitamin E 400 UNIT capsule Take 400 Units by mouth daily.     warfarin (COUMADIN) 5 MG tablet Take 1 tablet (5 mg total) by mouth daily. 30 tablet 11   No current facility-administered medications on file prior to visit.    Allergies  Allergen Reactions   Erythromycin Nausea And Vomiting   Atorvastatin Other (See Comments)    Pt reports causes body to be stiff, joints ache, aching in knees and ankles, feels more fatigued.    Valsartan Other (See Comments)    Pt reports caused her depression    Objective: Physical Exam  General: Well developed, nourished, no acute distress, awake, alert and oriented x 3  Vascular: Dorsalis pedis artery 1/4 bilateral, Posterior tibial artery 0/4 bilateral, skin temperature warm to warm proximal to distal bilateral lower extremities, moderate varicosities, no pedal hair present bilateral.  Trace edema noted bilateral.  Neurological: Gross sensation present via light touch bilateral.   Dermatological: Skin is warm, dry, and supple bilateral, Nails 1-10 are tender, long, thick, and discolored with mild subungal debris with first toes most involved bilateral, no webspace macerations present bilateral, no open lesions present bilateral, + callus/corns/hyperkeratotic tissue present bilateral 3rd toes and left fourth toe. No signs of infection bilateral.  Musculoskeletal: Asymptomatic hammertoe boney deformities and fat pad  atrophy noted bilateral. Muscular strength within normal limits without painon range of motion. No pain with calf compression bilateral.  Assessment and Plan:  Problem List Items Addressed This Visit   None Visit Diagnoses     Corn of toe    -  Primary   Pain due to onychomycosis of toenail       PVD (peripheral vascular disease) (HCC)       Hammer toe, unspecified laterality       Foot pain, bilateral           -  Examined patient.  -Discussed treatment options for painful mycotic nails and corns. -Mechanically debrided and reduced mycotic nails with sterile nail nipper and dremel nail file without incident. -Mechanically debrided corns bilateral third toes using a chisel blade without incident -Patient to see [facility] for casting for custom functional foot orthotics patient is aware that Medicare does not cover and is aware of charge of 438 -Advised good supportive shoes and sizing up to avoid rubbing and hammertoes -Return in 3 months for routine foot care or sooner if problems or issues arise  Landis Martins, DPM

## 2021-08-19 ENCOUNTER — Telehealth: Payer: Self-pay | Admitting: *Deleted

## 2021-08-19 NOTE — Telephone Encounter (Signed)
Patient is calling with concerns that her toe trimmed one week ago still hurts,hard to walk w/ shoes. Please advise.

## 2021-08-21 NOTE — Telephone Encounter (Signed)
Returned call to patient giving recommendations per Dr Cannon Kettle, said that is what she has been doing ,is better but will call back next week to schedule appointment if not better.

## 2021-08-25 DIAGNOSIS — N951 Menopausal and female climacteric states: Secondary | ICD-10-CM | POA: Diagnosis not present

## 2021-08-25 DIAGNOSIS — R5383 Other fatigue: Secondary | ICD-10-CM | POA: Diagnosis not present

## 2021-08-26 ENCOUNTER — Telehealth: Payer: Self-pay | Admitting: Cardiology

## 2021-08-26 ENCOUNTER — Ambulatory Visit: Payer: Medicare Other | Admitting: Neurology

## 2021-08-26 MED ORDER — LISINOPRIL 5 MG PO TABS: 5.0000 mg | ORAL_TABLET | Freq: Every day | ORAL | 1 refills | Status: DC

## 2021-08-26 NOTE — Telephone Encounter (Signed)
Chart reviewed.  Rx(s) sent to pharmacy electronically.  Patient notified and voiced understanding.   

## 2021-08-26 NOTE — Progress Notes (Deleted)
NEUROLOGY FOLLOW UP OFFICE NOTE  EMERLYN MEHLHOFF [id number]  Assessment/Plan:   Multiple sclerosis Left pontine and left cerebellar strokes,embolic of unknown source - already on [facility] Right vertebral artery stenosis - incidental finding - does not correlate with distribution of stroke Hypertension Hyperlipidemia  Subjective:  Bindu Docter is a 70 year old right-handed Caucasian woman with Multiple Sclerosis, COPD, CHF, mitral valve replacement, hypothyroidism and osteoporosis who follows up for MS and recent stroke.   UPDATE: Current DMT:  None.   Other medications/supplements:  D3 4000 IU daily, biotin 5075mcg daily.      HISTORY: She was diagnosed with multiple sclerosis in 1994.  At the time, she exhibited tingling in her hands as well as frequent falls.  She saw a neurologist who performed an MRI of the brain that revealed white matter lesions.  She underwent a lumbar puncture which reportedly demonstrated possible elevated IgG index.  It was recommended to start Avonex, but she declined, fearing potential side effects.  She has never been on disease modifying therapy. Mercury toxicity   She has not had any clear MS flares.  She has chronic symptoms, such as falls, neurogenic bladder and occasional tingling in the left hand.  She used to have muscle spasms in the right arm many years ago, which has resolved.  Over the past several months, she has started to use a rolling walker due to increased gait difficulty which she attributes to her MS-related chronic gait instability, as well as arthritis and scoliosis.  She currently is undergoing physical therapy and sees a Physiological scientist once a week.  She has a urologist.   She has never had optic neuritis or focal/unilateral weakness.   She was admitted to [facility] on 01/11/2021 for right sided weakness, dizziness, slurred/slowed speech.  Not tPA candidate as on Coumadin  MRI of brain personally reviewed showed acute  perforator infarct at the left paramedian pons and left cerebellum.  CTA of head and neck personally reviewed showed age-indeterminate but likely chronic high-grade stenosis/occlusion of proximal right vertebral artery and patent intracranial arteries.  Received vestibular rehab in hospital.  While dizziness mostly decondary to stroke, did have small component of BPPV.  Echocardiogram from previous July 2021 reviewed.  Advised by neurology to continue Coumadin, Lipitor 80mg  and normotensive blood pressure control.   Currently getting home PT/OT.  Lipitor caused myalgias/polyarthralgias, so she was switched to Crestor.     Imaging: MRI of brain without contrast was performed on 07/11/14 revealed supratentorial white matter lesions consistent with demyelinating disease as well as diffuse cortical atrophy.   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  She has sustained L2 and L5 compression fractures with height loss in the past due to her falls.   MRI of brain without contrast from 02/18/17 was personally reviewed and was stable compared to prior imaging from 07/11/14.  It revealed multiple lesions in the periventricular and juxta cortical white matter, as well as within the right brachium pontis.  MRI of cervical spine revealed thin appearance of cervical cord without demyelination.     MRI of brain without contrast from 12/15/2019 was stable compared to prior imaging from 02/18/2017.  PAST MEDICAL HISTORY: Past Medical History:  Diagnosis Date   Anxiety    Arthritis    knees   Breast cancer ([place]) 1999   CHF (congestive heart failure) ([place])  Related to severe mitral regurgitation, April, 2013   COPD (chronic obstructive pulmonary disease) ([place])    COPD with emphysema.. Assess by pulmonary team in the hospital April, 2013   Ejection fraction    EF 60%, echo,  April, 2013, with severe MR before mitral valve replacement   Herpes    Hypothyroidism    IBS (irritable bowel syndrome)    Mitral valve regurgitation    Mitral valve replacement April, 2013, Mitral valve prolapse   Multiple sclerosis (HCC)    Neurogenic bladder    Osteoporosis    Ovarian cyst    Paroxysmal atrial fibrillation (HCC)    Rapid atrial fibrillation in-hospital, Rapid cardioversion,  before mitral valve surgery   Pulmonary hypertension ([place])    Echo, April, 2013, before mitral valve surgery   S/P Maze operation for atrial fibrillation 01/26/2012   Complete biatrial lesion set using cryothermy via right mini thoracotomy   S/P mitral valve replacement 01/26/2012   21mm Sorin Carbomedics Optiform mechanical prosthesis via right mini thoracotomy   Warfarin anticoagulation    Mechanical mitral prosthesis, April, 20136    MEDICATIONS: Current Outpatient Medications on File Prior to Visit  Medication Sig Dispense Refill   acetaminophen (TYLENOL) 325 MG tablet Take 2 tablets (650 mg total) by mouth every 4 (four) hours as needed for mild pain (or temp > 37.5 C (99.5 F)).     ARMOUR THYROID 90 MG tablet Take 1 tablet (90 mg total) by mouth daily. 30 tablet 0   Biotin 5000 MCG CAPS Take 10,000 mcg by mouth in the morning and at bedtime.     Black Cohosh 40 MG CAPS Take 40 mg by mouth every evening.     Bromelains (BROMELAIN PO) Take 1 capsule by mouth 2 (two) times daily.     calcium carbonate (OSCAL) 1500 (600 Ca) MG TABS tablet Take 1,500 mg by mouth daily. Takes 1 time daily     Cholecalciferol (VITAMIN D3) 2000 units capsule Take 4,000 Units by mouth daily.     Coenzyme Q10 (CO Q-10) 100 MG CAPS Take 1 capsule by mouth daily.      Cranberry 500 MG CAPS Take 1 capsule by mouth daily.     ezetimibe (ZETIA) 10 MG tablet Take 1 tablet (10 mg total) by mouth daily. 90 tablet 1   fluticasone (FLONASE) 50 MCG/ACT nasal spray Place 1 spray into both nostrils daily. 16 g 6   L-THEANINE  PO Take 1 capsule by mouth daily.      lactose free nutrition (BOOST PLUS) LIQD Take 237 mLs by mouth daily.      lidocaine-prilocaine (EMLA) cream Apply 1 application topically as needed (per pt this is for use before blood draws).   0   lisinopril (ZESTRIL) 5 MG tablet Take 1 tablet (5 mg total) by mouth daily. 90 tablet 1   Lysine 500 MG CAPS Take 1 capsule by mouth daily.     Magnesium Citrate 200 MG TABS Take 200 mg by mouth daily.     meclizine (ANTIVERT) 12.5 MG tablet Take 1 tablet (12.5 mg total) by mouth 2 (two) times daily as needed for dizziness. 30 tablet 0   metoprolol tartrate (LOPRESSOR) 25 MG tablet TAKE 1 TAB (25 MG) PO TWICE DAILY - pt must make appt with provider for further refills - 1st attempt 180 tablet 3   MILK THISTLE PO Take 1 capsule by mouth daily.     Misc Natural Products (GLUCOSAMINE CHOND COMPLEX/MSM PO) Take 1  tablet by mouth in the morning and at bedtime. Strength of dose Glucosamine 1500mg  /Chodroitron 1000mg / MSM 500mg  takes one twice daily     Olive Leaf 500 MG CAPS Take 1 capsule by mouth 2 (two) times daily.      OVER THE COUNTER MEDICATION cholestaid     Petasin (PETADOLEX PO) Take 1 tablet by mouth 2 (two) times daily.     Probiotic Product (PROBIOTIC DAILY PO) Take 1 capsule by mouth daily. Take 1 capsule once a day     PROGESTERONE MICRONIZED PO Take 100 mg by mouth daily.      Pumpkin Seed 500 MG TABS Take 1 tablet by mouth 2 (two) times daily.     TURMERIC PO Take 1 capsule by mouth daily.     vitamin A 10000 UNIT capsule Take 10,000 Units by mouth daily.     vitamin B-12 (CYANOCOBALAMIN) 1000 MCG tablet Take 1,000 mcg by mouth daily. Taking sublingal     vitamin E 400 UNIT capsule Take 400 Units by mouth daily.     warfarin (COUMADIN) 5 MG tablet Take 1 tablet (5 mg total) by mouth daily. 30 tablet 11   No current facility-administered medications on file prior to visit.    ALLERGIES: Allergies  Allergen Reactions   Erythromycin Nausea And  Vomiting   Atorvastatin Other (See Comments)    Pt reports causes body to be stiff, joints ache, aching in knees and ankles, feels more fatigued.    Valsartan Other (See Comments)    Pt reports caused her depression    FAMILY HISTORY: Family History  Problem Relation Age of Onset   Heart disease Father        cardiac arrest    CAD Father    Hypertension Father    CAD Mother        5 stents and numerous bypass surgery   Hypertension Mother    Hyperlipidemia Mother    CAD Other    Breast cancer Maternal Grandmother    Breast cancer Maternal Aunt       Objective:  *** General: No acute distress.  Patient appears ***-groomed.   Head:  Normocephalic/atraumatic Eyes:  Fundi examined but not visualized Neck: supple, no paraspinal tenderness, full range of motion Heart:  Regular rate and rhythm Lungs:  Clear to auscultation bilaterally Back: No paraspinal tenderness Neurological Exam: alert and oriented to person, place, and time.  Speech fluent and not dysarthric, language intact.  CN II-XII intact. Bulk and tone normal, muscle strength 5/5 throughout.  Sensation to light touch intact.  Deep tendon reflexes 2+ throughout, toes downgoing.  Finger to nose testing intact.  Gait normal, Romberg negative.   Metta Clines, DO  CC: ***

## 2021-08-26 NOTE — Telephone Encounter (Signed)
*  STAT* If patient is at the pharmacy, call can be transferred to refill team.   1. Which medications need to be refilled? (please list name of each medication and dose if known)  lisinopril (ZESTRIL) 5 MG tablet  2. Which pharmacy/location (including street and city if local pharmacy) is medication to be sent to?  [id number] - [place], [place] - [address] AT [address]  3. Do they need a 30 day or 90 day supply? [name] [name]

## 2021-08-27 ENCOUNTER — Ambulatory Visit: Payer: Medicare Other | Admitting: Neurology

## 2021-09-02 DIAGNOSIS — N951 Menopausal and female climacteric states: Secondary | ICD-10-CM | POA: Diagnosis not present

## 2021-09-02 DIAGNOSIS — R5383 Other fatigue: Secondary | ICD-10-CM | POA: Diagnosis not present

## 2021-09-17 ENCOUNTER — Other Ambulatory Visit: Payer: Self-pay

## 2021-09-17 ENCOUNTER — Ambulatory Visit (INDEPENDENT_AMBULATORY_CARE_PROVIDER_SITE_OTHER): Payer: Medicare Other

## 2021-09-17 DIAGNOSIS — I480 Paroxysmal atrial fibrillation: Secondary | ICD-10-CM | POA: Diagnosis not present

## 2021-09-17 DIAGNOSIS — Z8679 Personal history of other diseases of the circulatory system: Secondary | ICD-10-CM | POA: Diagnosis not present

## 2021-09-17 DIAGNOSIS — Z952 Presence of prosthetic heart valve: Secondary | ICD-10-CM | POA: Diagnosis not present

## 2021-09-17 DIAGNOSIS — I509 Heart failure, unspecified: Secondary | ICD-10-CM

## 2021-09-17 DIAGNOSIS — Z9889 Other specified postprocedural states: Secondary | ICD-10-CM | POA: Diagnosis not present

## 2021-09-17 DIAGNOSIS — Z5181 Encounter for therapeutic drug level monitoring: Secondary | ICD-10-CM | POA: Diagnosis not present

## 2021-09-17 LAB — POCT INR: INR: 4 — AB (ref 2.0–3.0)

## 2021-09-17 NOTE — Patient Instructions (Signed)
Description   Skip today's dosage of Warfarin, then resume same dosage of warfarin 1 tablet daily except for 1.5 tablets on SUNDAYS, TUESDAYS, and THURSDAYS. Recheck INR in 2 weeks. Be consistent with your vitamin K foods. Coumadin Clinic [phone].

## 2021-09-18 DIAGNOSIS — N951 Menopausal and female climacteric states: Secondary | ICD-10-CM | POA: Diagnosis not present

## 2021-09-22 DIAGNOSIS — E559 Vitamin D deficiency, unspecified: Secondary | ICD-10-CM | POA: Diagnosis not present

## 2021-09-22 DIAGNOSIS — E039 Hypothyroidism, unspecified: Secondary | ICD-10-CM | POA: Diagnosis not present

## 2021-09-22 DIAGNOSIS — R7989 Other specified abnormal findings of blood chemistry: Secondary | ICD-10-CM | POA: Diagnosis not present

## 2021-09-22 DIAGNOSIS — N951 Menopausal and female climacteric states: Secondary | ICD-10-CM | POA: Diagnosis not present

## 2021-09-22 DIAGNOSIS — G35 Multiple sclerosis: Secondary | ICD-10-CM | POA: Diagnosis not present

## 2021-09-30 ENCOUNTER — Telehealth: Payer: Self-pay | Admitting: Cardiology

## 2021-09-30 DIAGNOSIS — N951 Menopausal and female climacteric states: Secondary | ICD-10-CM | POA: Diagnosis not present

## 2021-09-30 NOTE — Telephone Encounter (Signed)
Pt has a coumadin clinic appt on this Thursday 12/15.  Pt is asking while she is in the office for coumadin check, could she drop her handicap placard form off for Dr. Johney Frame to fill out and sign.  Informed the pt that would be completely ok, and she should have one of our Coumadin Clinic RN's come and drop the form off by our POD on Thursday, so we can be working on filling this out while she's having her coumadin appt.  Pt verbalized understanding and agrees with this plan.  Pt will have our coumadin clinic RN drop the form off by the POD while she's having her coumadin appt with them. Pt was gracious for all the assistance provided.

## 2021-09-30 NOTE — Telephone Encounter (Signed)
Pt would like to bring in the paperwork for her Handicap Placard to her upcoming appt 10/02/21 to be signed

## 2021-10-02 ENCOUNTER — Ambulatory Visit (INDEPENDENT_AMBULATORY_CARE_PROVIDER_SITE_OTHER): Payer: Medicare Other | Admitting: *Deleted

## 2021-10-02 ENCOUNTER — Other Ambulatory Visit: Payer: Self-pay

## 2021-10-02 DIAGNOSIS — Z952 Presence of prosthetic heart valve: Secondary | ICD-10-CM | POA: Diagnosis not present

## 2021-10-02 DIAGNOSIS — Z9889 Other specified postprocedural states: Secondary | ICD-10-CM

## 2021-10-02 DIAGNOSIS — Z5181 Encounter for therapeutic drug level monitoring: Secondary | ICD-10-CM

## 2021-10-02 DIAGNOSIS — Z8679 Personal history of other diseases of the circulatory system: Secondary | ICD-10-CM | POA: Diagnosis not present

## 2021-10-02 DIAGNOSIS — I480 Paroxysmal atrial fibrillation: Secondary | ICD-10-CM | POA: Diagnosis not present

## 2021-10-02 DIAGNOSIS — I509 Heart failure, unspecified: Secondary | ICD-10-CM

## 2021-10-02 LAB — POCT INR: INR: 2.4 (ref 2.0–3.0)

## 2021-10-02 NOTE — Patient Instructions (Signed)
Description   Today take 2 tablets then continue taking warfarin 1 tablet daily except for 1.5 tablets on SUNDAYS, TUESDAYS, and THURSDAYS. Recheck INR in 3 weeks. Be consistent with your vitamin K foods. Coumadin Clinic [phone].

## 2021-10-23 ENCOUNTER — Other Ambulatory Visit: Payer: Self-pay

## 2021-10-23 ENCOUNTER — Ambulatory Visit (INDEPENDENT_AMBULATORY_CARE_PROVIDER_SITE_OTHER): Payer: Medicare Other | Admitting: *Deleted

## 2021-10-23 DIAGNOSIS — I480 Paroxysmal atrial fibrillation: Secondary | ICD-10-CM | POA: Diagnosis not present

## 2021-10-23 DIAGNOSIS — Z5181 Encounter for therapeutic drug level monitoring: Secondary | ICD-10-CM | POA: Diagnosis not present

## 2021-10-23 LAB — POCT INR: INR: 2.8 (ref 2.0–3.0)

## 2021-10-23 NOTE — Patient Instructions (Addendum)
Description   Continue taking warfarin 1 tablet daily except for 1.5 tablets on SUNDAYS, TUESDAYS, and THURSDAYS. Recheck INR in 3 weeks.  Be consistent with your vitamin K foods. Coumadin Clinic [phone].

## 2021-10-24 ENCOUNTER — Ambulatory Visit: Payer: Medicare Other

## 2021-10-30 ENCOUNTER — Ambulatory Visit: Payer: Medicare Other

## 2021-11-01 DIAGNOSIS — R5383 Other fatigue: Secondary | ICD-10-CM | POA: Diagnosis not present

## 2021-11-01 DIAGNOSIS — N951 Menopausal and female climacteric states: Secondary | ICD-10-CM | POA: Diagnosis not present

## 2021-11-07 ENCOUNTER — Ambulatory Visit: Payer: Medicare Other

## 2021-11-07 ENCOUNTER — Other Ambulatory Visit: Payer: Self-pay

## 2021-11-07 DIAGNOSIS — M79672 Pain in left foot: Secondary | ICD-10-CM

## 2021-11-07 DIAGNOSIS — M79671 Pain in right foot: Secondary | ICD-10-CM

## 2021-11-07 DIAGNOSIS — L84 Corns and callosities: Secondary | ICD-10-CM

## 2021-11-07 DIAGNOSIS — M2040 Other hammer toe(s) (acquired), unspecified foot: Secondary | ICD-10-CM

## 2021-11-08 NOTE — Progress Notes (Signed)
SITUATION Reason for Consult: Evaluation for Bilateral Custom Foot Orthoses Patient / Caregiver Report: Patient is ready for foot orthotics  OBJECTIVE DATA: Patient History / Diagnosis:    ICD-10-CM   1. Hammer toe, unspecified laterality  M20.40     2. Foot pain, bilateral  M79.671    M79.672     3. Corn of toe  L84       Current or Previous Devices: Previous custom FO user  Foot Examination: Skin presentation:   Intact Ulcers & Callousing:   None and no history Toe / Foot Deformities:  Hammertoes Weight Bearing Presentation:  Planus Sensation:    Intact  ORTHOTIC RECOMMENDATION Recommended Device: 1x pair of custom functional foot orthotics  GOALS OF ORTHOSES - Reduce Pain - Prevent Foot Deformity - Prevent Progression of Further Foot Deformity - Relieve Pressure - Improve the Overall Biomechanical Function of the Foot and Lower Extremity.  ACTIONS PERFORMED Patient was casted for Foot Orthoses via crush box. Procedure was explained and patient tolerated procedure well. All questions were answered and concerns addressed.  PLAN Potential out of pocket cost was communicated to patient. Casts are to be sent to [facility] for fabrication. Patient is to be called for fitting when devices are ready.

## 2021-11-13 ENCOUNTER — Ambulatory Visit (INDEPENDENT_AMBULATORY_CARE_PROVIDER_SITE_OTHER): Payer: Medicare Other | Admitting: *Deleted

## 2021-11-13 ENCOUNTER — Other Ambulatory Visit: Payer: Self-pay

## 2021-11-13 DIAGNOSIS — Z952 Presence of prosthetic heart valve: Secondary | ICD-10-CM

## 2021-11-13 DIAGNOSIS — I480 Paroxysmal atrial fibrillation: Secondary | ICD-10-CM | POA: Diagnosis not present

## 2021-11-13 DIAGNOSIS — Z8679 Personal history of other diseases of the circulatory system: Secondary | ICD-10-CM | POA: Diagnosis not present

## 2021-11-13 DIAGNOSIS — Z5181 Encounter for therapeutic drug level monitoring: Secondary | ICD-10-CM | POA: Diagnosis not present

## 2021-11-13 DIAGNOSIS — Z9889 Other specified postprocedural states: Secondary | ICD-10-CM

## 2021-11-13 DIAGNOSIS — I509 Heart failure, unspecified: Secondary | ICD-10-CM

## 2021-11-13 LAB — POCT INR: INR: 3 (ref 2.0–3.0)

## 2021-11-13 NOTE — Patient Instructions (Signed)
Description   Continue taking warfarin 1 tablet daily except for 1.5 tablets on SUNDAYS, TUESDAYS, and THURSDAYS. Recheck INR in 4 weeks. Be consistent with your vitamin K foods. Coumadin Clinic [phone].

## 2021-11-20 ENCOUNTER — Ambulatory Visit: Payer: Medicare Other | Admitting: Sports Medicine

## 2021-11-25 DIAGNOSIS — M9901 Segmental and somatic dysfunction of cervical region: Secondary | ICD-10-CM | POA: Diagnosis not present

## 2021-11-25 DIAGNOSIS — M9904 Segmental and somatic dysfunction of sacral region: Secondary | ICD-10-CM | POA: Diagnosis not present

## 2021-11-25 DIAGNOSIS — M9903 Segmental and somatic dysfunction of lumbar region: Secondary | ICD-10-CM | POA: Diagnosis not present

## 2021-11-25 DIAGNOSIS — M9905 Segmental and somatic dysfunction of pelvic region: Secondary | ICD-10-CM | POA: Diagnosis not present

## 2021-12-02 DIAGNOSIS — M9903 Segmental and somatic dysfunction of lumbar region: Secondary | ICD-10-CM | POA: Diagnosis not present

## 2021-12-02 DIAGNOSIS — M9905 Segmental and somatic dysfunction of pelvic region: Secondary | ICD-10-CM | POA: Diagnosis not present

## 2021-12-02 DIAGNOSIS — M9901 Segmental and somatic dysfunction of cervical region: Secondary | ICD-10-CM | POA: Diagnosis not present

## 2021-12-02 DIAGNOSIS — M9904 Segmental and somatic dysfunction of sacral region: Secondary | ICD-10-CM | POA: Diagnosis not present

## 2021-12-09 ENCOUNTER — Ambulatory Visit: Payer: Medicare Other

## 2021-12-09 ENCOUNTER — Other Ambulatory Visit: Payer: Self-pay

## 2021-12-09 DIAGNOSIS — M9904 Segmental and somatic dysfunction of sacral region: Secondary | ICD-10-CM | POA: Diagnosis not present

## 2021-12-09 DIAGNOSIS — M9901 Segmental and somatic dysfunction of cervical region: Secondary | ICD-10-CM | POA: Diagnosis not present

## 2021-12-09 DIAGNOSIS — M9903 Segmental and somatic dysfunction of lumbar region: Secondary | ICD-10-CM | POA: Diagnosis not present

## 2021-12-09 DIAGNOSIS — M2040 Other hammer toe(s) (acquired), unspecified foot: Secondary | ICD-10-CM

## 2021-12-09 DIAGNOSIS — M9905 Segmental and somatic dysfunction of pelvic region: Secondary | ICD-10-CM | POA: Diagnosis not present

## 2021-12-09 NOTE — Progress Notes (Signed)
SITUATION: Reason for Visit: Fitting and Delivery of Custom Fabricated Foot Orthoses Patient Report: Patient reports comfort and is satisfied with device.  OBJECTIVE DATA: Patient History / Diagnosis:     ICD-10-CM   1. Hammer toe, unspecified laterality  M20.40       Provided Device:  Custom Functional Foot Orthotics     [facility]: [phone]  GOAL OF ORTHOSIS - Improve gait - Decrease energy expenditure - Improve Balance - Provide Triplanar stability of foot complex - Facilitate motion  ACTIONS PERFORMED Patient was fit with foot orthotics trimmed to shoe last. Patient tolerated fittign procedure.   Patient was provided with verbal and written instruction and demonstration regarding donning, doffing, wear, care, proper fit, function, purpose, cleaning, and use of the orthosis and in all related precautions and risks and benefits regarding the orthosis.  Patient was also provided with verbal instruction regarding how to report any failures or malfunctions of the orthosis and necessary follow up care. Patient was also instructed to contact our office regarding any change in status that may affect the function of the orthosis.  Patient demonstrated independence with proper donning, doffing, and fit and verbalized understanding of all instructions.  PLAN: Patient is to follow up in one week or as necessary (PRN). All questions were answered and concerns addressed. Plan of care was discussed with and agreed upon by the patient.

## 2021-12-11 ENCOUNTER — Other Ambulatory Visit: Payer: Self-pay

## 2021-12-11 ENCOUNTER — Ambulatory Visit (INDEPENDENT_AMBULATORY_CARE_PROVIDER_SITE_OTHER): Payer: Medicare Other | Admitting: *Deleted

## 2021-12-11 DIAGNOSIS — I480 Paroxysmal atrial fibrillation: Secondary | ICD-10-CM | POA: Diagnosis not present

## 2021-12-11 DIAGNOSIS — I509 Heart failure, unspecified: Secondary | ICD-10-CM

## 2021-12-11 DIAGNOSIS — Z9889 Other specified postprocedural states: Secondary | ICD-10-CM | POA: Diagnosis not present

## 2021-12-11 DIAGNOSIS — Z952 Presence of prosthetic heart valve: Secondary | ICD-10-CM | POA: Diagnosis not present

## 2021-12-11 DIAGNOSIS — Z8679 Personal history of other diseases of the circulatory system: Secondary | ICD-10-CM

## 2021-12-11 DIAGNOSIS — Z5181 Encounter for therapeutic drug level monitoring: Secondary | ICD-10-CM | POA: Diagnosis not present

## 2021-12-11 LAB — POCT INR: INR: 2.8 (ref 2.0–3.0)

## 2021-12-11 NOTE — Patient Instructions (Signed)
Description   Continue taking warfarin 1 tablet daily except for 1.5 tablets on SUNDAYS, TUESDAYS, and THURSDAYS. Recheck INR in 5 weeks. Be consistent with your vitamin K foods. Coumadin Clinic [phone].

## 2021-12-16 DIAGNOSIS — M9905 Segmental and somatic dysfunction of pelvic region: Secondary | ICD-10-CM | POA: Diagnosis not present

## 2021-12-16 DIAGNOSIS — M9903 Segmental and somatic dysfunction of lumbar region: Secondary | ICD-10-CM | POA: Diagnosis not present

## 2021-12-16 DIAGNOSIS — M9901 Segmental and somatic dysfunction of cervical region: Secondary | ICD-10-CM | POA: Diagnosis not present

## 2021-12-16 DIAGNOSIS — M9904 Segmental and somatic dysfunction of sacral region: Secondary | ICD-10-CM | POA: Diagnosis not present

## 2021-12-18 ENCOUNTER — Telehealth: Payer: Self-pay | Admitting: Cardiology

## 2021-12-18 MED ORDER — WARFARIN SODIUM 5 MG PO TABS: ORAL_TABLET | ORAL | 1 refills | Status: DC

## 2021-12-18 NOTE — Telephone Encounter (Signed)
?*  STAT* If patient is at the pharmacy, call can be transferred to refill team. ? ? ?1. Which medications need to be refilled? (please list name of each medication and dose if known) warfarin (COUMADIN) 5 MG tablet [[id number]]  ? ?2. Which pharmacy/location (including street and city if local pharmacy) is medication to be sent to?  ?[id number] - [place], [address] AT [facility] [address] Phone:  [phone]  ?Fax:  [phone]  ?  ? ? ?3. Do they need a 30 day or 90 day supply? 90 ? ?Pt stated she only has a couple more   ?

## 2021-12-23 DIAGNOSIS — M9901 Segmental and somatic dysfunction of cervical region: Secondary | ICD-10-CM | POA: Diagnosis not present

## 2021-12-23 DIAGNOSIS — M9903 Segmental and somatic dysfunction of lumbar region: Secondary | ICD-10-CM | POA: Diagnosis not present

## 2021-12-23 DIAGNOSIS — M9904 Segmental and somatic dysfunction of sacral region: Secondary | ICD-10-CM | POA: Diagnosis not present

## 2021-12-23 DIAGNOSIS — M9905 Segmental and somatic dysfunction of pelvic region: Secondary | ICD-10-CM | POA: Diagnosis not present

## 2021-12-30 DIAGNOSIS — M9904 Segmental and somatic dysfunction of sacral region: Secondary | ICD-10-CM | POA: Diagnosis not present

## 2021-12-30 DIAGNOSIS — M9903 Segmental and somatic dysfunction of lumbar region: Secondary | ICD-10-CM | POA: Diagnosis not present

## 2021-12-30 DIAGNOSIS — M9905 Segmental and somatic dysfunction of pelvic region: Secondary | ICD-10-CM | POA: Diagnosis not present

## 2021-12-30 DIAGNOSIS — M9901 Segmental and somatic dysfunction of cervical region: Secondary | ICD-10-CM | POA: Diagnosis not present

## 2021-12-31 ENCOUNTER — Telehealth: Payer: Self-pay | Admitting: Family Medicine

## 2021-12-31 NOTE — Telephone Encounter (Signed)
FYI: ? ?Pt fell in her house this morning around 11:30 am ? ? Pt legs are sore. Her neighbors helped her get in her bed. ?She's been in her bed since this morning, she can't get down the stairs. ?Pt called advance home care. They informed her that she had to call her PCP. So Doctor can put in referral for Physical Therapy.  ? ?Pt wanted to push OV to Monday instead of Thursday or Friday. ?Pt has been scheduled for OV on 01/05/2022 on 1:00 pm ? ? ? ? ?

## 2022-01-01 ENCOUNTER — Inpatient Hospital Stay (HOSPITAL_COMMUNITY): Payer: Medicare Other

## 2022-01-01 ENCOUNTER — Other Ambulatory Visit: Payer: Self-pay

## 2022-01-01 ENCOUNTER — Inpatient Hospital Stay (HOSPITAL_BASED_OUTPATIENT_CLINIC_OR_DEPARTMENT_OTHER)
Admission: EM | Admit: 2022-01-01 | Discharge: 2022-01-12 | DRG: 493 | Disposition: A | Discharge status: Skilled Nursing Facility | Payer: Medicare Other | Attending: Internal Medicine | Admitting: Internal Medicine

## 2022-01-01 ENCOUNTER — Emergency Department (HOSPITAL_BASED_OUTPATIENT_CLINIC_OR_DEPARTMENT_OTHER): Payer: Medicare Other

## 2022-01-01 ENCOUNTER — Encounter (HOSPITAL_BASED_OUTPATIENT_CLINIC_OR_DEPARTMENT_OTHER): Payer: Self-pay | Admitting: Emergency Medicine

## 2022-01-01 DIAGNOSIS — Z01811 Encounter for preprocedural respiratory examination: Secondary | ICD-10-CM | POA: Diagnosis not present

## 2022-01-01 DIAGNOSIS — Z8673 Personal history of transient ischemic attack (TIA), and cerebral infarction without residual deficits: Secondary | ICD-10-CM

## 2022-01-01 DIAGNOSIS — S92352A Displaced fracture of fifth metatarsal bone, left foot, initial encounter for closed fracture: Secondary | ICD-10-CM | POA: Diagnosis not present

## 2022-01-01 DIAGNOSIS — E039 Hypothyroidism, unspecified: Secondary | ICD-10-CM | POA: Diagnosis not present

## 2022-01-01 DIAGNOSIS — D649 Anemia, unspecified: Secondary | ICD-10-CM | POA: Diagnosis not present

## 2022-01-01 DIAGNOSIS — M810 Age-related osteoporosis without current pathological fracture: Secondary | ICD-10-CM | POA: Diagnosis not present

## 2022-01-01 DIAGNOSIS — Z8249 Family history of ischemic heart disease and other diseases of the circulatory system: Secondary | ICD-10-CM | POA: Diagnosis not present

## 2022-01-01 DIAGNOSIS — S8264XA Nondisplaced fracture of lateral malleolus of right fibula, initial encounter for closed fracture: Principal | ICD-10-CM | POA: Diagnosis present

## 2022-01-01 DIAGNOSIS — N319 Neuromuscular dysfunction of bladder, unspecified: Secondary | ICD-10-CM | POA: Diagnosis present

## 2022-01-01 DIAGNOSIS — M25461 Effusion, right knee: Secondary | ICD-10-CM | POA: Diagnosis not present

## 2022-01-01 DIAGNOSIS — G35 Multiple sclerosis: Secondary | ICD-10-CM | POA: Diagnosis present

## 2022-01-01 DIAGNOSIS — I480 Paroxysmal atrial fibrillation: Secondary | ICD-10-CM | POA: Diagnosis not present

## 2022-01-01 DIAGNOSIS — Z803 Family history of malignant neoplasm of breast: Secondary | ICD-10-CM

## 2022-01-01 DIAGNOSIS — W19XXXA Unspecified fall, initial encounter: Secondary | ICD-10-CM | POA: Diagnosis not present

## 2022-01-01 DIAGNOSIS — I110 Hypertensive heart disease with heart failure: Secondary | ICD-10-CM | POA: Diagnosis present

## 2022-01-01 DIAGNOSIS — E739 Lactose intolerance, unspecified: Secondary | ICD-10-CM | POA: Diagnosis present

## 2022-01-01 DIAGNOSIS — D62 Acute posthemorrhagic anemia: Secondary | ICD-10-CM | POA: Diagnosis present

## 2022-01-01 DIAGNOSIS — S82144A Nondisplaced bicondylar fracture of right tibia, initial encounter for closed fracture: Secondary | ICD-10-CM | POA: Diagnosis present

## 2022-01-01 DIAGNOSIS — S82141D Displaced bicondylar fracture of right tibia, subsequent encounter for closed fracture with routine healing: Secondary | ICD-10-CM | POA: Diagnosis not present

## 2022-01-01 DIAGNOSIS — S92352D Displaced fracture of fifth metatarsal bone, left foot, subsequent encounter for fracture with routine healing: Secondary | ICD-10-CM | POA: Diagnosis not present

## 2022-01-01 DIAGNOSIS — J439 Emphysema, unspecified: Secondary | ICD-10-CM | POA: Diagnosis present

## 2022-01-01 DIAGNOSIS — Z7901 Long term (current) use of anticoagulants: Secondary | ICD-10-CM | POA: Diagnosis not present

## 2022-01-01 DIAGNOSIS — W1839XA Other fall on same level, initial encounter: Secondary | ICD-10-CM | POA: Diagnosis present

## 2022-01-01 DIAGNOSIS — S82191A Other fracture of upper end of right tibia, initial encounter for closed fracture: Secondary | ICD-10-CM

## 2022-01-01 DIAGNOSIS — S82201A Unspecified fracture of shaft of right tibia, initial encounter for closed fracture: Secondary | ICD-10-CM | POA: Diagnosis not present

## 2022-01-01 DIAGNOSIS — L89891 Pressure ulcer of other site, stage 1: Secondary | ICD-10-CM | POA: Diagnosis not present

## 2022-01-01 DIAGNOSIS — M7989 Other specified soft tissue disorders: Secondary | ICD-10-CM | POA: Diagnosis not present

## 2022-01-01 DIAGNOSIS — M25561 Pain in right knee: Secondary | ICD-10-CM | POA: Diagnosis not present

## 2022-01-01 DIAGNOSIS — I4891 Unspecified atrial fibrillation: Secondary | ICD-10-CM | POA: Diagnosis not present

## 2022-01-01 DIAGNOSIS — Z6823 Body mass index (BMI) 23.0-23.9, adult: Secondary | ICD-10-CM | POA: Diagnosis not present

## 2022-01-01 DIAGNOSIS — Z853 Personal history of malignant neoplasm of breast: Secondary | ICD-10-CM | POA: Diagnosis not present

## 2022-01-01 DIAGNOSIS — S80919A Unspecified superficial injury of unspecified knee, initial encounter: Secondary | ICD-10-CM | POA: Diagnosis not present

## 2022-01-01 DIAGNOSIS — Z79899 Other long term (current) drug therapy: Secondary | ICD-10-CM

## 2022-01-01 DIAGNOSIS — S8264XD Nondisplaced fracture of lateral malleolus of right fibula, subsequent encounter for closed fracture with routine healing: Secondary | ICD-10-CM | POA: Diagnosis not present

## 2022-01-01 DIAGNOSIS — I10 Essential (primary) hypertension: Secondary | ICD-10-CM | POA: Diagnosis not present

## 2022-01-01 DIAGNOSIS — Z20822 Contact with and (suspected) exposure to covid-19: Secondary | ICD-10-CM | POA: Diagnosis not present

## 2022-01-01 DIAGNOSIS — I5032 Chronic diastolic (congestive) heart failure: Secondary | ICD-10-CM | POA: Diagnosis present

## 2022-01-01 DIAGNOSIS — S82141A Displaced bicondylar fracture of right tibia, initial encounter for closed fracture: Secondary | ICD-10-CM | POA: Diagnosis present

## 2022-01-01 DIAGNOSIS — Z4789 Encounter for other orthopedic aftercare: Secondary | ICD-10-CM | POA: Diagnosis not present

## 2022-01-01 DIAGNOSIS — E038 Other specified hypothyroidism: Secondary | ICD-10-CM

## 2022-01-01 DIAGNOSIS — I509 Heart failure, unspecified: Secondary | ICD-10-CM

## 2022-01-01 DIAGNOSIS — E440 Moderate protein-calorie malnutrition: Secondary | ICD-10-CM | POA: Diagnosis present

## 2022-01-01 DIAGNOSIS — S8990XA Unspecified injury of unspecified lower leg, initial encounter: Secondary | ICD-10-CM | POA: Diagnosis not present

## 2022-01-01 DIAGNOSIS — I272 Pulmonary hypertension, unspecified: Secondary | ICD-10-CM | POA: Diagnosis present

## 2022-01-01 DIAGNOSIS — S82831A Other fracture of upper and lower end of right fibula, initial encounter for closed fracture: Secondary | ICD-10-CM | POA: Diagnosis not present

## 2022-01-01 DIAGNOSIS — L89611 Pressure ulcer of right heel, stage 1: Secondary | ICD-10-CM | POA: Diagnosis present

## 2022-01-01 DIAGNOSIS — Y92003 Bedroom of unspecified non-institutional (private) residence as the place of occurrence of the external cause: Secondary | ICD-10-CM

## 2022-01-01 DIAGNOSIS — Z952 Presence of prosthetic heart valve: Secondary | ICD-10-CM | POA: Diagnosis not present

## 2022-01-01 DIAGNOSIS — S82154D Nondisplaced fracture of right tibial tuberosity, subsequent encounter for closed fracture with routine healing: Secondary | ICD-10-CM | POA: Diagnosis not present

## 2022-01-01 DIAGNOSIS — Z01818 Encounter for other preprocedural examination: Secondary | ICD-10-CM | POA: Diagnosis not present

## 2022-01-01 DIAGNOSIS — S82101A Unspecified fracture of upper end of right tibia, initial encounter for closed fracture: Secondary | ICD-10-CM | POA: Diagnosis not present

## 2022-01-01 DIAGNOSIS — M25061 Hemarthrosis, right knee: Secondary | ICD-10-CM | POA: Diagnosis not present

## 2022-01-01 DIAGNOSIS — S82111A Displaced fracture of right tibial spine, initial encounter for closed fracture: Secondary | ICD-10-CM | POA: Diagnosis not present

## 2022-01-01 DIAGNOSIS — Z7401 Bed confinement status: Secondary | ICD-10-CM | POA: Diagnosis not present

## 2022-01-01 HISTORY — DX: Displaced bicondylar fracture of right tibia, initial encounter for closed fracture: S82.141A

## 2022-01-01 LAB — CBC WITH DIFFERENTIAL/PLATELET
Abs Immature Granulocytes: 0.03 10*3/uL (ref 0.00–0.07)
Basophils Absolute: 0.1 10*3/uL (ref 0.0–0.1)
Basophils Relative: 1 %
Eosinophils Absolute: 0.1 10*3/uL (ref 0.0–0.5)
Eosinophils Relative: 1 %
HCT: 36.8 % (ref 36.0–46.0)
Hemoglobin: 12.6 g/dL (ref 12.0–15.0)
Immature Granulocytes: 0 %
Lymphocytes Relative: 10 %
Lymphs Abs: 1 10*3/uL (ref 0.7–4.0)
MCH: 33.2 pg (ref 26.0–34.0)
MCHC: 34.2 g/dL (ref 30.0–36.0)
MCV: 96.8 fL (ref 80.0–100.0)
Monocytes Absolute: 0.9 10*3/uL (ref 0.1–1.0)
Monocytes Relative: 9 %
Neutro Abs: 8.1 10*3/uL — ABNORMAL HIGH (ref 1.7–7.7)
Neutrophils Relative %: 79 %
Platelets: 200 10*3/uL (ref 150–400)
RBC: 3.8 MIL/uL — ABNORMAL LOW (ref 3.87–5.11)
RDW: 13.4 % (ref 11.5–15.5)
WBC: 10.2 10*3/uL (ref 4.0–10.5)
nRBC: 0 % (ref 0.0–0.2)

## 2022-01-01 LAB — BASIC METABOLIC PANEL
Anion gap: 9 (ref 5–15)
BUN: 22 mg/dL (ref 8–23)
CO2: 29 mmol/L (ref 22–32)
Calcium: 9 mg/dL (ref 8.9–10.3)
Chloride: 102 mmol/L (ref 98–111)
Creatinine, Ser: 0.73 mg/dL (ref 0.44–1.00)
GFR, Estimated: >60 mL/min (ref >60)
Glucose, Bld: 125 mg/dL — ABNORMAL HIGH (ref 70–99)
Potassium: 3.5 mmol/L (ref 3.5–5.1)
Sodium: 140 mmol/L (ref 135–145)

## 2022-01-01 LAB — TYPE AND SCREEN
ABO/RH(D): A POS
Antibody Screen: NEGATIVE

## 2022-01-01 LAB — RESP PANEL BY RT-PCR (FLU A&B, COVID) ARPGX2
Influenza A by PCR: NEGATIVE
Influenza B by PCR: NEGATIVE
SARS Coronavirus 2 by RT PCR: NEGATIVE

## 2022-01-01 LAB — PROTIME-INR
INR: 2.9 — ABNORMAL HIGH (ref 0.8–1.2)
Prothrombin Time: 30.3 seconds — ABNORMAL HIGH (ref 11.4–15.2)

## 2022-01-01 MED ORDER — MAGNESIUM OXIDE -MG SUPPLEMENT 400 (240 MG) MG PO TABS
200.0000 mg | ORAL_TABLET | Freq: Every day | ORAL | Status: DC
  Administered 2022-01-02 – 2022-01-12 (×10): 200 mg via ORAL
  Filled 2022-01-01 (×10): qty 1

## 2022-01-01 MED ORDER — CALCIUM CARBONATE 1250 (500 CA) MG PO TABS
1250.0000 mg | ORAL_TABLET | Freq: Every day | ORAL | Status: DC
  Administered 2022-01-02 – 2022-01-12 (×10): 1250 mg via ORAL
  Filled 2022-01-01 (×10): qty 1

## 2022-01-01 MED ORDER — VITAMIN D 25 MCG (1000 UNIT) PO TABS
2000.0000 [IU] | ORAL_TABLET | ORAL | Status: DC
  Administered 2022-01-02 – 2022-01-12 (×6): 2000 [IU] via ORAL
  Filled 2022-01-01 (×7): qty 2

## 2022-01-01 MED ORDER — ACETAMINOPHEN 500 MG PO TABS
1000.0000 mg | ORAL_TABLET | Freq: Once | ORAL | Status: AC
  Administered 2022-01-01: 1000 mg via ORAL
  Filled 2022-01-01: qty 2

## 2022-01-01 MED ORDER — LISINOPRIL 5 MG PO TABS
5.0000 mg | ORAL_TABLET | Freq: Every day | ORAL | Status: DC
  Administered 2022-01-02 – 2022-01-12 (×10): 5 mg via ORAL
  Filled 2022-01-01 (×10): qty 1

## 2022-01-01 MED ORDER — MORPHINE SULFATE (PF) 2 MG/ML IV SOLN: 0.5000 mg | INTRAVENOUS | Status: DC | PRN

## 2022-01-01 MED ORDER — HYDROCODONE-ACETAMINOPHEN 5-325 MG PO TABS
1.0000 | ORAL_TABLET | Freq: Four times a day (QID) | ORAL | Status: DC | PRN
  Filled 2022-01-01: qty 1

## 2022-01-01 MED ORDER — MAGNESIUM CITRATE 200 MG PO TABS: 100.0000 mg | ORAL_TABLET | Freq: Every day | ORAL | Status: DC

## 2022-01-01 MED ORDER — VITAMIN D 25 MCG (1000 UNIT) PO TABS
4000.0000 [IU] | ORAL_TABLET | ORAL | Status: DC
  Administered 2022-01-03 – 2022-01-11 (×4): 4000 [IU] via ORAL
  Filled 2022-01-01 (×6): qty 4

## 2022-01-01 MED ORDER — ACETAMINOPHEN 500 MG PO TABS
1000.0000 mg | ORAL_TABLET | Freq: Four times a day (QID) | ORAL | Status: DC | PRN
  Administered 2022-01-01 – 2022-01-12 (×26): 1000 mg via ORAL
  Filled 2022-01-01 (×26): qty 2

## 2022-01-01 MED ORDER — FLUTICASONE PROPIONATE 50 MCG/ACT NA SUSP
1.0000 | Freq: Every day | NASAL | Status: DC
  Administered 2022-01-02 – 2022-01-12 (×10): 1 via NASAL
  Filled 2022-01-01: qty 16

## 2022-01-01 MED ORDER — VITAMIN K1 10 MG/ML IJ SOLN
2.0000 mg | Freq: Once | INTRAVENOUS | Status: DC
  Filled 2022-01-01: qty 0.2

## 2022-01-01 MED ORDER — METOPROLOL TARTRATE 25 MG PO TABS
25.0000 mg | ORAL_TABLET | Freq: Two times a day (BID) | ORAL | Status: DC
  Administered 2022-01-01 – 2022-01-12 (×21): 25 mg via ORAL
  Filled 2022-01-01 (×21): qty 1

## 2022-01-01 MED ORDER — THYROID 60 MG PO TABS
90.0000 mg | ORAL_TABLET | Freq: Every day | ORAL | Status: DC
  Administered 2022-01-02 – 2022-01-12 (×11): 90 mg via ORAL
  Filled 2022-01-01 (×11): qty 1

## 2022-01-01 NOTE — Assessment & Plan Note (Addendum)
Continue blood pressure control with lisinopril and metoprolol.  ?Her blood pressure remained well controlled during her hospitalization.  ? ?

## 2022-01-01 NOTE — Telephone Encounter (Signed)
Noted  

## 2022-01-01 NOTE — Progress Notes (Addendum)
ANTICOAGULATION CONSULT NOTE - Initial Consult ? ?Pharmacy Consult for IV Heparin; Vit K ?Indication:  mechanical valve ;  ?Allergies  ?Allergen Reactions  ? Erythromycin Nausea And Vomiting  ? Atorvastatin Other (See Comments)  ?  Pt reports causes body to be stiff, joints ache, aching in knees and ankles, feels more fatigued.   ? Valsartan Other (See Comments)  ?  Pt reports caused her depression  ? ? ?Patient Measurements: ?Height: '5\' 7"'$  (170.2 cm) ?Weight: 66.6 kg (146 lb 13.2 oz) ?IBW/kg (Calculated) : 61.6 ?Heparin Dosing Weight: 66.6 kg ? ?Vital Signs: ?Temp: 98 ?F (36.7 ?C) (03/16 2019) ?Temp Source: Oral (03/16 2019) ?BP: 151/85 (03/16 2019) ?Pulse Rate: 99 (03/16 2019) ? ?Labs: ?Recent Labs  ?  01/01/22 ?1728  ?HGB 12.6  ?HCT 36.8  ?PLT 200  ?LABPROT 30.3*  ?INR 2.9*  ?CREATININE 0.73  ? ? ?Estimated Creatinine Clearance: 61.8 mL/min (by C-G formula based on SCr of 0.73 mg/dL). ? ? ?Medical History: ?Past Medical History:  ?Diagnosis Date  ? Anxiety   ? Arthritis   ? knees  ? Breast cancer ([place]) 1999  ? CHF (congestive heart failure) ([place])   ? Related to severe mitral regurgitation, April, 2013  ? COPD (chronic obstructive pulmonary disease) ([place])   ? COPD with emphysema.. Assess by pulmonary team in the hospital April, 2013  ? Ejection fraction   ? EF 60%, echo, April, 2013, with severe MR before mitral valve replacement  ? Herpes   ? Hypothyroidism   ? IBS (irritable bowel syndrome)   ? Mitral valve regurgitation   ? Mitral valve replacement April, 2013, Mitral valve prolapse  ? Multiple sclerosis ([place])   ? Neurogenic bladder   ? Osteoporosis   ? Ovarian cyst   ? Paroxysmal atrial fibrillation ([place])   ? Rapid atrial fibrillation in-hospital, Rapid cardioversion,  before mitral valve surgery  ? Pulmonary hypertension ([place])   ? Echo, April, 2013, before mitral valve surgery  ? S/P Maze operation for atrial fibrillation 01/26/2012  ? Complete biatrial lesion set using cryothermy via right mini thoracotomy  ?  S/P mitral valve replacement 01/26/2012  ? 64m Sorin Carbomedics Optiform mechanical prosthesis via right mini thoracotomy  ? Warfarin anticoagulation   ? Mechanical mitral prosthesis, April, 20136  ? ? ?Medications:  ?Scheduled:  ? ?Assessment: ?72years of age female with a mechanical mitral valve who needs surgery for fracture of R-knee after fall. Pharmacy consulted to reverse INR (chronic Coumadin) for surgery and transition to IV Heparin therapy.   ? ?INR 2.9 (at goal for valve) but likely need < 1. 5 for surgery.  ?Patient is on chronic Coumadin therapy  -  7.'5mg'$  Sun/Tues/Thur and '5mg'$  all other days.  ? ?Addendum:  ?Dr. GAlcario Droughtmessaged that Surgery wants to hold off on Vitamin K reversal and plans for surgery early next week.  ? ?Goal of Therapy:  ?Heparin level 0.3-0.7 units/ml ?Monitor platelets by anticoagulation protocol: Yes ?  ?Plan:  ?Cancel Vitamin K.  ?Repeat INR in AM.  ?Start IV Heparin when INR less that <2.5.  ?Daily CBC.  ? ?JSloan Leiter PharmD, BCPS, BCCCP ?Clinical Pharmacist ?Please refer to [facility]for MHighlandnumbers ?01/01/2022,9:00 PM ? ? ?

## 2022-01-01 NOTE — Assessment & Plan Note (Addendum)
Normal LVEF and functioning MV prosthesis as of 2d echo in 2021. ?No clinical signs of exacerbation  ?Continue with lisinopril and metoprolol.  ?Patient not on maintenance diuretic therapy.  ?

## 2022-01-01 NOTE — Progress Notes (Signed)
Orthopedic Tech Progress Note ?Patient Details:  ?[name] [name] ?09/11/1951 ?[id number] ? ?Overhead frame not permitted for this pt due to age restrictions (>73 years old). ? ?Patient ID: [name] [name], female   DOB: 1950-12-08, 71 y.o.   MRN: [id number] ? ?[name] [name] ?01/01/2022, 8:56 PM ? ?

## 2022-01-01 NOTE — ED Provider Notes (Signed)
?[facility] EMERGENCY DEPARTMENT ?Provider Note ? ? ?CSN: [id number] ?Arrival date & time: 01/01/22  1455 ? ?  ? ?History ? ?Chief Complaint  ?Patient presents with  ? Fall  ? Knee Pain  ? ? ?[name] [name] is a 71 y.o. female who presents emergency department complaining of right knee and leg pain.  Patient states that she fell yesterday morning when transferring from her bed and grabbing her walker.  She believes that her right knee twisted and she fell onto the ground.  She believes that her knee was turned slightly inward when she made direct contact with the ground.  She fell on carpet.  She is on blood thinners, but denies any head trauma or neck trauma.  There was no loss of consciousness, dizziness or lightheadedness.  She tried wearing a knee brace and ankle brace with minimal relief.  Has been taking Tylenol at home.  No other trauma noted. ? ? ?Fall ? ?Knee Pain ? ?  ? ?Home Medications ?Prior to Admission medications   ?Medication Sig Start Date End Date Taking? Authorizing Provider  ?acetaminophen (TYLENOL) 325 MG tablet Take 2 tablets (650 mg total) by mouth every 4 (four) hours as needed for mild pain (or temp > 37.5 C (99.5 F)). 01/29/21   Angiulli, Lavon Paganini, PA-C  ?ARMOUR THYROID 90 MG tablet Take 1 tablet (90 mg total) by mouth daily. 01/29/21   Angiulli, Lavon Paganini, PA-C  ?Biotin 5000 MCG CAPS Take 10,000 mcg by mouth in the morning and at bedtime.    [provider]  ?Black Cohosh 40 MG CAPS Take 40 mg by mouth every evening.    [provider]  ?Bromelains (BROMELAIN PO) Take 1 capsule by mouth 2 (two) times daily.    [provider]  ?calcium carbonate (OSCAL) 1500 (600 Ca) MG TABS tablet Take 1,500 mg by mouth daily. Takes 1 time daily    [provider]  ?Cholecalciferol (VITAMIN D3) 2000 units capsule Take 4,000 Units by mouth daily.    [provider]  ?Coenzyme Q10 (CO Q-10) 100 MG CAPS Take 1 capsule by mouth daily.     [provider]  ?Cranberry 500 MG CAPS Take 1 capsule by mouth daily.    [provider]  ?ezetimibe (ZETIA) 10 MG tablet Take 1 tablet (10 mg total) by mouth daily. 05/01/21   Freada Bergeron, MD  ?fluticasone (FLONASE) 50 MCG/ACT nasal spray Place 1 spray into both nostrils daily. 04/29/21   Kuneff, Renee A, DO  ?L-THEANINE PO Take 1 capsule by mouth daily.     [provider]  ?lactose free nutrition (BOOST PLUS) LIQD Take 237 mLs by mouth daily.     [provider]  ?lidocaine-prilocaine (EMLA) cream Apply 1 application topically as needed (per pt this is for use before blood draws).  03/06/15   [provider]  ?lisinopril (ZESTRIL) 5 MG tablet Take 1 tablet (5 mg total) by mouth daily. 08/26/21   Freada Bergeron, MD  ?Lysine 500 MG CAPS Take 1 capsule by mouth daily.    [provider]  ?Magnesium Citrate 200 MG TABS Take 200 mg by mouth daily.    [provider]  ?meclizine (ANTIVERT) 12.5 MG tablet Take 1 tablet (12.5 mg total) by mouth 2 (two) times daily as needed for dizziness. 01/29/21   Angiulli, Lavon Paganini, PA-C  ?metoprolol tartrate (LOPRESSOR) 25 MG tablet TAKE 1 TAB (25 MG) PO TWICE DAILY - pt  must make appt with provider for further refills - 1st attempt 01/29/21   [place], Lavon Paganini, PA-C  ?MILK THISTLE PO Take 1 capsule by mouth daily.    [provider]  ?Misc Natural Products (GLUCOSAMINE CHOND COMPLEX/MSM PO) Take 1 tablet by mouth in the morning and at bedtime. Strength of dose Glucosamine '1500mg'$  /Chodroitron '1000mg'$ / MSM '500mg'$  takes one twice daily    [provider]  ?Olive Leaf 500 MG CAPS Take 1 capsule by mouth 2 (two) times daily.     [provider]  ?OVER THE COUNTER MEDICATION cholestaid    [provider]  ?Petasin (PETADOLEX PO) Take 1 tablet by mouth 2 (two) times daily.    [provider]  ?Probiotic Product (PROBIOTIC DAILY PO) Take 1 capsule by mouth daily. Take 1 capsule once a  day    [provider]  ?PROGESTERONE MICRONIZED PO Take 100 mg by mouth daily.     [provider]  ?Pumpkin Seed 500 MG TABS Take 1 tablet by mouth 2 (two) times daily.    [provider]  ?TURMERIC PO Take 1 capsule by mouth daily.    [provider]  ?vitamin A 10000 UNIT capsule Take 10,000 Units by mouth daily.    [provider]  ?vitamin B-12 (CYANOCOBALAMIN) 1000 MCG tablet Take 1,000 mcg by mouth daily. Taking sublingal    [provider]  ?vitamin E 400 UNIT capsule Take 400 Units by mouth daily.    [provider]  ?warfarin (COUMADIN) 5 MG tablet Take 1 tablet by mouth daily except 1.5 tablets on Sundays, Tuesdays, and Thursdays or as directed Anticoagulation Clinic. 12/18/21   Freada Bergeron, MD  ?   ? ?Allergies    ?Erythromycin, Atorvastatin, and Valsartan   ? ?Review of Systems   ?Review of Systems  ?Musculoskeletal:   ?     Right knee and leg pain  ?Neurological:  Negative for syncope, weakness, light-headedness and numbness.  ?All other systems reviewed and are negative. ? ?Physical Exam ?Updated Vital Signs ?BP (!) 156/68   Pulse 89   Temp 98.7 ?F (37.1 ?C) (Oral)   Resp 15   Ht '5\' 7"'$  (1.702 m)   Wt 59.9 kg   SpO2 99%   BMI 20.67 kg/m?  ?Physical Exam ?Vitals and nursing note reviewed.  ?Constitutional:   ?   Appearance: Normal appearance.  ?HENT:  ?   Head: Normocephalic and atraumatic.  ?Eyes:  ?   Conjunctiva/sclera: Conjunctivae normal.  ?Cardiovascular:  ?   Pulses:     ?     Dorsalis pedis pulses are 2+ on the right side and 2+ on the left side.  ?Pulmonary:  ?   Effort: Pulmonary effort is normal. No respiratory distress.  ?Musculoskeletal:  ?   Right lower leg: 1+ Edema present.  ?   Comments: Limited range of motion of the right knee and right ankle due to pain.  Significant soft tissue swelling of the right knee, and generalized tenderness to palpation.  Tenderness over the tibial plateau.  Nonpitting edema of the  right calf.  No overlying skin changes.  ?Skin: ?   General: Skin is warm and dry.  ?Neurological:  ?   Mental Status: She is alert.  ?   Comments: Normal sensation in bilateral lower extremities.   ?Psychiatric:     ?   Mood and Affect: Mood normal.     ?   Behavior: Behavior normal.  ? ? ?  ED Results / Procedures / Treatments   ?Labs ?(all labs ordered are listed, but only abnormal results are displayed) ?Labs Reviewed  ?RESP PANEL BY RT-PCR (FLU A&B, COVID) [id number]  ?CBC WITH DIFFERENTIAL/PLATELET  ?BASIC METABOLIC PANEL  ?PROTIME-INR  ? ? ?EKG ?None ? ?Radiology ?DG Tibia/Fibula Right ? ?Result Date: 01/01/2022 ?CLINICAL DATA:  Fall. EXAM: RIGHT TIBIA AND FIBULA - 2 VIEW COMPARISON:  None. FINDINGS: There is an acute oblique/vertical fracture extending from the central tibial plateau into the proximal diaphysis of the tibia. This is nondisplaced. There is no dislocation. There are mild degenerative changes of the right knee including chondrocalcinosis in the medial and lateral compartments. There is soft tissue swelling of the lower extremity and knee. IMPRESSION: 1. Acute nondisplaced fracture involving the tibial plateau and proximal tibial diaphysis. Electronically Signed   By: Ronney Asters M.D.   On: 01/01/2022 15:52  ? ?CT Tibia Fibula Right Wo Contrast ? ?Result Date: 01/01/2022 ?CLINICAL DATA:  Fall yesterday. Lower leg pain with proximal tibial fracture on radiographs. EXAM: CT OF THE LOWER RIGHT EXTREMITY WITHOUT CONTRAST TECHNIQUE: Multidetector CT imaging of the right lower leg was performed according to the standard protocol. RADIATION DOSE REDUCTION: This exam was performed according to the departmental dose-optimization program which includes automated exposure control, adjustment of the mA and/or kV according to patient size and/or use of iterative reconstruction technique. COMPARISON:  Radiographs same date. FINDINGS: Bones/Joint/Cartilage The bones are diffusely demineralized. There is an acute,  nondisplaced but mildly comminuted intra-articular fracture of the proximal tibia. This involves both tibial plateaus. There is no significant depression of the articular surface. There is distal extension of this f

## 2022-01-01 NOTE — Plan of Care (Signed)
Originating Facility: [facility] ?Requesting Physician/APP: Lorin Roemhildt, PA-C ? ?History: ?[name] [name] is a 71 year old female with past medical history significant for hypothyroidism, HTN, paroxysmal atrial fibrillation on Coumadin, nonischemic cardiomyopath, history of severe mitral regurgitation s/p MVR with mechanical valve/maze procedure 2019, history of CVA, COPD who presented to [facility] complaining of right knee and leg pain.  Found to have tibial plateau fracture, proximal tibial fracture, fibular fracture, lateral malleolus fracture.  Case was discussed with orthopedics, Dr. Doran Durand who recommended medical admission given her comorbidities.  Ortho trauma service likely plan surgical intervention on 3/17 ? ?Plan of Care:  ?--Admit to MedSurg ?--Recommend: Holding Coumadin; INR currently pending at time of transfer ?--N.p.o. after midnight for likely surgical intervention tomorrow ? ? ?TRH will assume care on arrival to accepting facility. Until arrival, care as per EDP. However, TRH available 24/7 for questions and assistance. ?  ?Please page [place] and Consults [phone]) as soon as the patient arrives to the hospital.  ? ?Ali Mohl [place] DO ? ?

## 2022-01-01 NOTE — Assessment & Plan Note (Addendum)
Tibial plateau fracture, proximal tibial fracture, fibular fracture, lateral malleolus fracture.  ? ?Patient was admitted to the medical ward, she received analgesics, and DVT prophylaxis ? ?Orthopedics was consulted  ?Patient underwent  ORIF on 01/05/22 with good toleration.  ?She was found to have right knee hemarthrosis, and joint was aspirated during surgical procedure.  ? ?Post operative her pain remained well controlled, she was evaluated by physical and occupational therapy with recommendations to continue therapy at SNF.  ?

## 2022-01-01 NOTE — Consult Note (Signed)
Reason for Consult:  right leg pain Referring Physician: Dr. [place]  [name] is an 71 y.o. female.  HPI: 71 year old female with past medical history significant for mitral valve replacement on warfarin complains of right leg pain since a fall earlier today.  She was seen at [facility].  Radiographs reveal a nondisplaced proximal tibial shaft fracture and extension into the tibial plateau.  She was splinted and is now admitted to [facility] for further management of this injury.  Her INR is quite elevated.  She c/o pain in the leg with motion.  Better now that she's splinted.  Past Medical History:  Diagnosis Date   Anxiety    Arthritis    knees   Breast cancer (HCC) 1999   CHF (congestive heart failure) (HCC)    Related to severe mitral regurgitation, April, 2013   COPD (chronic obstructive pulmonary disease) (HCC)    COPD with emphysema.. Assess by pulmonary team in the hospital April, 2013   Ejection fraction    EF 60%, echo, April, 2013, with severe MR before mitral valve replacement   Herpes    Hypothyroidism    IBS (irritable bowel syndrome)    Mitral valve regurgitation    Mitral valve replacement April, 2013, Mitral valve prolapse   Multiple sclerosis (HCC)    Neurogenic bladder    Osteoporosis    Ovarian cyst    Paroxysmal atrial fibrillation (HCC)    Rapid atrial fibrillation in-hospital, Rapid cardioversion,  before mitral valve surgery   Pulmonary hypertension (HCC)    Echo, April, 2013, before mitral valve surgery   S/P Maze operation for atrial fibrillation 01/26/2012   Complete biatrial lesion set using cryothermy via right mini thoracotomy   S/P mitral valve replacement 01/26/2012   31mm Sorin [employer] Optiform mechanical prosthesis via right mini thoracotomy   Warfarin anticoagulation    Mechanical mitral prosthesis, April, 44034    Past Surgical History:  Procedure Laterality Date   BREAST LUMPECTOMY Right 1999   with sent.node, and  axillary dissection (20)   CHEST TUBE INSERTION  01/26/2012   Procedure: CHEST TUBE INSERTION;  Surgeon: Purcell Nails, MD;  Location: [facility] OR;  Service: Open Heart Surgery;  Laterality: Left;   COLONOSCOPY  2010   "normal"   CYSTOSCOPY  1992   LAPAROSCOPIC OVARIAN CYSTECTOMY  1978   urethral stricture repair   LEFT AND RIGHT HEART CATHETERIZATION WITH CORONARY ANGIOGRAM N/A 01/20/2012   Procedure: LEFT AND RIGHT HEART CATHETERIZATION WITH CORONARY ANGIOGRAM;  Surgeon: Kathleene Hazel, MD;  Location: [facility] CATH LAB;  Service: Cardiovascular;  Laterality: N/A;   LYMPHADENECTOMY     MAZE  01/26/2012   Procedure: MAZE;  Surgeon: Purcell Nails, MD;  Location: [facility] OR;  Service: Open Heart Surgery;  Laterality: N/A;   MITRAL VALVE REPLACEMENT  01/26/2012   Procedure: MINIMALLY INVASIVE MITRAL VALVE (MV) REPLACEMENT;  Surgeon: Purcell Nails, MD;  Location: [facility] OR;  Service: Open Heart Surgery;  Laterality: Right;   TEE WITHOUT CARDIOVERSION  01/19/2012   Procedure: TRANSESOPHAGEAL ECHOCARDIOGRAM (TEE);  Surgeon: Peter M [place], MD;  Location: [facility] ENDOSCOPY;  Service: Cardiovascular;  Laterality: N/A;   TONSILLECTOMY  1970   UMBILICAL HERNIA REPAIR  1952   WRIST SURGERY Right 2012    Family History  Problem Relation Age of Onset   Heart disease Father        cardiac arrest    CAD Father    Hypertension Father  CAD Mother        5 stents and numerous bypass surgery   Hypertension Mother    Hyperlipidemia Mother    CAD Other    Breast cancer Maternal Grandmother    Breast cancer Maternal Aunt     Social History:  reports that she has never smoked. She has never used smokeless tobacco. She reports that she does not drink alcohol and does not use drugs.  Allergies:  Allergies  Allergen Reactions   Erythromycin Nausea And Vomiting   Atorvastatin Other (See Comments)    Pt reports causes body to be stiff, joints ache, aching in knees and ankles, feels more fatigued.    Valsartan Other  (See Comments)    Pt reports caused her depression    Medications: I have reviewed the patient's current medications.  Results for orders placed or performed during the hospital encounter of 01/01/22 (from the past 48 hour(s))  CBC with Differential     Status: Abnormal   Collection Time: 01/01/22  5:28 PM  Result Value Ref Range   WBC 10.2 4.0 - 10.5 K/uL   RBC 3.80 (L) 3.87 - 5.11 MIL/uL   Hemoglobin 12.6 12.0 - 15.0 g/dL   HCT 40.9 81.1 - 91.4 %   MCV 96.8 80.0 - 100.0 fL   MCH 33.2 26.0 - 34.0 pg   MCHC 34.2 30.0 - 36.0 g/dL   RDW 78.2 95.6 - 21.3 %   Platelets 200 150 - 400 K/uL   nRBC 0.0 0.0 - 0.2 %   Neutrophils Relative % 79 %   Neutro Abs 8.1 (H) 1.7 - 7.7 K/uL   Lymphocytes Relative 10 %   Lymphs Abs 1.0 0.7 - 4.0 K/uL   Monocytes Relative 9 %   Monocytes Absolute 0.9 0.1 - 1.0 K/uL   Eosinophils Relative 1 %   Eosinophils Absolute 0.1 0.0 - 0.5 K/uL   Basophils Relative 1 %   Basophils Absolute 0.1 0.0 - 0.1 K/uL   Immature Granulocytes 0 %   Abs Immature Granulocytes 0.03 0.00 - 0.07 K/uL    Comment: Performed at [facility], [address] [place], [place] [address]  Basic metabolic panel     Status: Abnormal   Collection Time: 01/01/22  5:28 PM  Result Value Ref Range   Sodium 140 135 - 145 mmol/L   Potassium 3.5 3.5 - 5.1 mmol/L   Chloride 102 98 - 111 mmol/L   CO2 29 22 - 32 mmol/L   Glucose, Bld 125 (H) 70 - 99 mg/dL    Comment: Glucose reference range applies only to samples taken after fasting for at least 8 hours.   BUN 22 8 - 23 mg/dL   Creatinine, Ser 8.46 0.44 - 1.00 mg/dL   Calcium 9.0 8.9 - 96.2 mg/dL   GFR, Estimated >95 >28 mL/min    Comment: (NOTE) Calculated using the CKD-EPI Creatinine Equation (2021)    Anion gap 9 5 - 15    Comment: Performed at [facility], [address] [place], [place] [address]  Protime-INR     Status: Abnormal   Collection Time: 01/01/22  5:28 PM  Result Value Ref Range    Prothrombin Time 30.3 (H) 11.4 - 15.2 seconds   INR 2.9 (H) 0.8 - 1.2    Comment: (NOTE) INR goal varies based on device and disease states. Performed at [facility], [address] [place], [place] [address]   Resp  Panel by RT-PCR (Flu A&B, Covid) Nasopharyngeal Swab     Status: None   Collection Time: 01/01/22  5:29 PM   Specimen: Nasopharyngeal Swab; Nasopharyngeal(NP) swabs in vial transport medium  Result Value Ref Range   SARS Coronavirus 2 by RT PCR NEGATIVE NEGATIVE    Comment: (NOTE) SARS-CoV-2 target nucleic acids are NOT DETECTED.  The SARS-CoV-2 RNA is generally detectable in upper respiratory specimens during the acute phase of infection. The lowest concentration of SARS-CoV-2 viral copies this assay can detect is 138 copies/mL. A negative result does not preclude SARS-Cov-2 infection and should not be used as the sole basis for treatment or other patient management decisions. A negative result may occur with  improper specimen collection/handling, submission of specimen other than nasopharyngeal swab, presence of viral mutation(s) within the areas targeted by this assay, and inadequate number of viral copies(<138 copies/mL). A negative result must be combined with clinical observations, patient history, and epidemiological information. The expected result is Negative.  Fact Sheet for Patients:  BloggerCourse.com  Fact Sheet for Healthcare Providers:  SeriousBroker.it  This test is no t yet approved or cleared by the [place] FDA and  has been authorized for detection and/or diagnosis of SARS-CoV-2 by FDA under an Emergency Use Authorization (EUA). This EUA will remain  in effect (meaning this test can be used) for the duration of the COVID-19 declaration under Section 564(b)(1) of the Act, 21 U.S.C.section 360bbb-3(b)(1), unless the authorization is terminated  or revoked sooner.        Influenza A by PCR NEGATIVE NEGATIVE   Influenza B by PCR NEGATIVE NEGATIVE    Comment: (NOTE) The Xpert Xpress SARS-CoV-2/FLU/RSV plus assay is intended as an aid in the diagnosis of influenza from Nasopharyngeal swab specimens and should not be used as a sole basis for treatment. Nasal washings and aspirates are unacceptable for Xpert Xpress SARS-CoV-2/FLU/RSV testing.  Fact Sheet for Patients: BloggerCourse.com  Fact Sheet for Healthcare Providers: SeriousBroker.it  This test is not yet approved or cleared by the [place] FDA and has been authorized for detection and/or diagnosis of SARS-CoV-2 by FDA under an Emergency Use Authorization (EUA). This EUA will remain in effect (meaning this test can be used) for the duration of the COVID-19 declaration under Section 564(b)(1) of the Act, 21 U.S.C. section 360bbb-3(b)(1), unless the authorization is terminated or revoked.  Performed at [facility], [address] [place], [place] [address]    *Note: Due to a large number of results and/or encounters for the requested time period, some results have not been displayed. A complete set of results can be found in Results Review.    DG Tibia/Fibula Right  Result Date: 01/01/2022 CLINICAL DATA:  Fall. EXAM: RIGHT TIBIA AND FIBULA - 2 VIEW COMPARISON:  None. FINDINGS: There is an acute oblique/vertical fracture extending from the central tibial plateau into the proximal diaphysis of the tibia. This is nondisplaced. There is no dislocation. There are mild degenerative changes of the right knee including chondrocalcinosis in the medial and lateral compartments. There is soft tissue swelling of the lower extremity and knee. IMPRESSION: 1. Acute nondisplaced fracture involving the tibial plateau and proximal tibial diaphysis. Electronically Signed   By: Darliss Cheney M.D.   On: 01/01/2022 15:52   CT Tibia Fibula Right Wo  Contrast  Result Date: 01/01/2022 CLINICAL DATA:  Fall yesterday. Lower leg pain with proximal tibial fracture on radiographs. EXAM: CT OF THE LOWER RIGHT EXTREMITY WITHOUT CONTRAST TECHNIQUE: Multidetector  CT imaging of the right lower leg was performed according to the standard protocol. RADIATION DOSE REDUCTION: This exam was performed according to the departmental dose-optimization program which includes automated exposure control, adjustment of the mA and/or kV according to patient size and/or use of iterative reconstruction technique. COMPARISON:  Radiographs same date. FINDINGS: Bones/Joint/Cartilage The bones are diffusely demineralized. There is an acute, nondisplaced but mildly comminuted intra-articular fracture of the proximal tibia. This involves both tibial plateaus. There is no significant depression of the articular surface. There is distal extension of this fracture into the metadiaphysis, extending approximately 14 cm below the joint line. These components of the fracture are minimally displaced. There is evidence of an acute nondisplaced fracture of the fibular neck, best seen on the coronal images. In addition, there is an acute oblique fracture of the distal fibula, also best seen on the reformatted images. The distal tibia appears intact. The distal femur, patella and talus appear intact. There is a moderate sized lipohemarthrosis at the knee. No significant ankle joint effusion. Diffuse chondrocalcinosis noted at the knee and ankle. Ligaments Suboptimally assessed by CT. Muscles and Tendons No focal muscular abnormalities are identified within the lower leg. The extensor mechanism appears intact at the knee. The visualized ankle tendons appear intact with associated soft tissue calcifications. Soft tissues Mild subcutaneous edema greatest anteriorly in the mid lower leg. No focal fluid collection, foreign body or soft tissue emphysema. Diffuse vascular calcifications. IMPRESSION: 1.  Nondisplaced intra-articular fracture of the proximal tibia, involving both tibial plateaus. There is distal extension of the fracture into the metadiaphysis. 2. Nondisplaced fractures of the fibular neck and lateral malleolus. 3. Moderate sized lipohemarthrosis at the knee. Electronically Signed   By: Carey Bullocks M.D.   On: 01/01/2022 16:55    Review of Systems no recent fever, chills, nausea, vomiting or changes in her appetite.  10 system review is o/w negative. Blood pressure (!) 150/63, pulse 87, temperature 98.5 F (36.9 C), temperature source Oral, resp. rate 16, height 5\' 7"  (1.702 m), weight 59.9 kg, SpO2 99 %. Physical Exam Wn wd female in nad.  A and o x 4.  Normal mood and affect.  EOMI.  Resp unlabored.  R LE with long leg splint in place.  Toes with brisk cap refill.  Intact sens to LT at the forefoot.  No lymphedema.  Active PF and DF strength at the toes.  Assessment/Plan: Right tibial plateau, tibial shaft and lateral malleolus fractures - long-leg splint.  Nonweightbearing.  Hold warfarin.  Pt will need OR for ORIF of the plateau and IM nail of the tibia when INR permits.  Presence of mechanical valve requires slow reversal of INR.  Plan OR for early next week.  Trauma consult likely tomorrow.    Toni Arthurs 01/01/2022, 7:11 PM

## 2022-01-01 NOTE — ED Triage Notes (Signed)
Per pt she fell yesterday, at 1130 am, transferring from beds to  walker , fell on rt side knee and hurt ankle ?

## 2022-01-01 NOTE — Assessment & Plan Note (Addendum)
Continue with levothyroxine  

## 2022-01-01 NOTE — Assessment & Plan Note (Addendum)
Coumadin use in setting of PAF, mechanical MVR, prior strokes.  Pt high risk for further strokes/thrombosis in the mitral valve . Coumadin on hold.  INR in the range of 3.  When INR comes around/below 2.5, need to start on heparin drip because of presence of mechanical mitral valve.  No need to start on heparin drip today because INR is 3.  Will avoid giving vitamin K for reversal because of difficulty in bringing INR back up ?

## 2022-01-01 NOTE — H&P (Signed)
?History and Physical  ? ? ?Patient: [name] [name] YQM:[id number] DOB: [birth year] ?DOA: 01/01/2022 ?DOS: the patient was seen and examined on 01/01/2022 ?PCP: Howard Pouch A, DO  ?Patient coming from: Home ? ?Chief Complaint:  ?Chief Complaint  ?Patient presents with  ? Fall  ? Knee Pain  ? ?HPI: [name] [name] is a 71 y.o. female with medical history significant of PAF s/p maze, MVR s/p replacement with mechanical valve, on chronic coumadin, multiple strokes, HFpEF, HTN. ? ?Pt transferring to walker from bed yesterday morning, fell, injured R knee and leg.  Pain in R knee and leg after fall.  Trying knee brace, pain persists, not really able to ambulate due to pain. ? ?Presented to ED today due to persistent symptoms. ?  ?Review of Systems: As mentioned in the history of present illness. All other systems reviewed and are negative. ?Past Medical History:  ?Diagnosis Date  ? Anxiety   ? Arthritis   ? knees  ? Breast cancer ([place]) 1999  ? CHF (congestive heart failure) ([place])   ? Related to severe mitral regurgitation, April, 2013  ? COPD (chronic obstructive pulmonary disease) ([place])   ? COPD with emphysema.. Assess by pulmonary team in the hospital April, 2013  ? Ejection fraction   ? EF 60%, echo, April, 2013, with severe MR before mitral valve replacement  ? Herpes   ? Hypothyroidism   ? IBS (irritable bowel syndrome)   ? Mitral valve regurgitation   ? Mitral valve replacement April, 2013, Mitral valve prolapse  ? Multiple sclerosis ([place])   ? Neurogenic bladder   ? Osteoporosis   ? Ovarian cyst   ? Paroxysmal atrial fibrillation ([place])   ? Rapid atrial fibrillation in-hospital, Rapid cardioversion,  before mitral valve surgery  ? Pulmonary hypertension ([place])   ? Echo, April, 2013, before mitral valve surgery  ? S/P Maze operation for atrial fibrillation 01/26/2012  ? Complete biatrial lesion set using cryothermy via right mini thoracotomy  ? S/P mitral valve replacement 01/26/2012  ? 25m Sorin [employer]omedics Optiform  mechanical prosthesis via right mini thoracotomy  ? Warfarin anticoagulation   ? Mechanical mitral prosthesis, April, 20136  ? ?Past Surgical History:  ?Procedure Laterality Date  ? BREAST LUMPECTOMY Right 1999  ? with sent.node, and axillary dissection (20)  ? CHEST TUBE INSERTION  01/26/2012  ? Procedure: CHEST TUBE INSERTION;  Surgeon: CRexene Alberts MD;  [place] [facility]  Service: Open Heart Surgery;  Laterality: Left;  ? COLONOSCOPY  2010  ? "normal"  ? CYSTOSCOPY  1992  ? LAPAROSCOPIC OVARIAN CYSTECTOMY  1978  ? urethral stricture repair  ? LEFT AND RIGHT HEART CATHETERIZATION WITH CORONARY ANGIOGRAM N/A 01/20/2012  ? Procedure: LEFT AND RIGHT HEART CATHETERIZATION WITH CORONARY ANGIOGRAM;  Surgeon: CBurnell Blanks MD;  [facility];  Service: Cardiovascular;  Laterality: N/A;  ? LYMPHADENECTOMY    ? MAZE  01/26/2012  ? Procedure: MAZE;  Surgeon: CRexene Alberts MD;  [place] [facility]  Service: Open Heart Surgery;  Laterality: N/A;  ? MITRAL VALVE REPLACEMENT  01/26/2012  ? Procedure: MINIMALLY INVASIVE MITRAL VALVE (MV) REPLACEMENT;  Surgeon: CRexene Alberts MD;  Location: [facility]  Service: Open Heart Surgery;  Laterality: Right;  ? TEE WITHOUT CARDIOVERSION  01/19/2012  ? Procedure: TRANSESOPHAGEAL ECHOCARDIOGRAM (TEE);  Surgeon: Peter M JMartinique MD;  [facility];  Service: Cardiovascular;  Laterality: N/A;  ? TONSIL[place]OMY  1970  ? [place] WRIST  SURGERY Right 2012  ? ?Social History:  reports that she has never smoked. She has never used smokeless tobacco. She reports that she does not drink alcohol and does not use drugs. ? ?Allergies  ?Allergen Reactions  ? Erythromycin Nausea And Vomiting  ? Atorvastatin Other (See Comments)  ?  Pt reports causes body to be stiff, joints ache, aching in knees and ankles, feels more fatigued.   ? Valsartan Other (See Comments)  ?  Pt reports caused her depression  ? ? ?Family History  ?Problem Relation Age of Onset  ? Heart  disease Father   ?     cardiac arrest   ? CAD Father   ? Hypertension Father   ? CAD Mother   ?     5 stents and numerous bypass surgery  ? Hypertension Mother   ? Hyperlipidemia Mother   ? CAD Other   ? Breast cancer Maternal Grandmother   ? Breast cancer Maternal Aunt   ? ? ?Prior to Admission medications   ?Medication Sig Start Date End Date Taking? Authorizing Provider  ?acetaminophen (TYLENOL) 325 MG tablet Take 2 tablets (650 mg total) by mouth every 4 (four) hours as needed for mild pain (or temp > 37.5 C (99.5 F)). 01/29/21   Angiulli, Lavon Paganini, PA-C  ?ARMOUR THYROID 90 MG tablet Take 1 tablet (90 mg total) by mouth daily. 01/29/21   Angiulli, Lavon Paganini, PA-C  ?Biotin 5000 MCG CAPS Take 10,000 mcg by mouth in the morning and at bedtime.    [provider]  ?Black Cohosh 40 MG CAPS Take 40 mg by mouth every evening.    [provider]  ?Bromelains (BROMELAIN PO) Take 1 capsule by mouth 2 (two) times daily.    [provider]  ?calcium carbonate (OSCAL) 1500 (600 Ca) MG TABS tablet Take 1,500 mg by mouth daily. Takes 1 time daily    [provider]  ?Cholecalciferol (VITAMIN D3) 2000 units capsule Take 4,000 Units by mouth daily.    [provider]  ?Coenzyme Q10 (CO Q-10) 100 MG CAPS Take 1 capsule by mouth daily.     [provider]  ?Cranberry 500 MG CAPS Take 1 capsule by mouth daily.    [provider]  ?ezetimibe (ZETIA) 10 MG tablet Take 1 tablet (10 mg total) by mouth daily. 05/01/21   Freada Bergeron, MD  ?fluticasone (FLONASE) 50 MCG/ACT nasal spray Place 1 spray into both nostrils daily. 04/29/21   Kuneff, Renee A, DO  ?L-THEANINE PO Take 1 capsule by mouth daily.     [provider]  ?lactose free nutrition (BOOST PLUS) LIQD Take 237 mLs by mouth daily.     [provider]  ?lidocaine-prilocaine (EMLA) cream Apply 1 application topically as needed (per pt this is for use before blood draws).  03/06/15   [provider]  ?lisinopril (ZESTRIL) 5 MG tablet Take 1 tablet (5 mg total) by mouth daily. 08/26/21   Freada Bergeron, MD  ?Lysine 500 MG CAPS Take 1 capsule by mouth daily.    [provider]  ?Magnesium Citrate 200 MG TABS Take 200 mg by mouth daily.    [provider]  ?meclizine (ANTIVERT) 12.5 MG tablet Take 1 tablet (12.5 mg total) by mouth 2 (two) times daily as needed for dizziness. 01/29/21   Angiulli, Lavon Paganini, PA-C  ?metoprolol tartrate (LOPRESSOR) 25 MG tablet TAKE 1 TAB (25 MG) PO TWICE DAILY - pt must make appt  with provider for further refills - 1st attempt 01/29/21   Parker, Lavon Paganini, PA-C  ?MILK THISTLE PO Take 1 capsule by mouth daily.    [provider]  ?Misc Natural Products (GLUCOSAMINE CHOND COMPLEX/MSM PO) Take 1 tablet by mouth in the morning and at bedtime. Strength of dose Glucosamine '1500mg'$  /Chodroitron '1000mg'$ / MSM '500mg'$  takes one twice daily    [provider]  ?Olive Leaf 500 MG CAPS Take 1 capsule by mouth 2 (two) times daily.     [provider]  ?OVER THE COUNTER MEDICATION cholestaid    [provider]  ?Petasin (PETADOLEX PO) Take 1 tablet by mouth 2 (two) times daily.    [provider]  ?Probiotic Product (PROBIOTIC DAILY PO) Take 1 capsule by mouth daily. Take 1 capsule once a day    [provider]  ?PROGESTERONE MICRONIZED PO Take 100 mg by mouth daily.     [provider]  ?Pumpkin Seed 500 MG TABS Take 1 tablet by mouth 2 (two) times daily.    [provider]  ?TURMERIC PO Take 1 capsule by mouth daily.    [provider]  ?vitamin A 10000 UNIT capsule Take 10,000 Units by mouth daily.    [provider]  ?vitamin B-12 (CYANOCOBALAMIN) 1000 MCG tablet Take 1,000 mcg by mouth daily. Taking sublingal    [provider]  ?vitamin E 400 UNIT capsule Take 400 Units by mouth daily.    [provider]  ?warfarin (COUMADIN) 5 MG tablet Take 1 tablet by  mouth daily except 1.5 tablets on Sundays, Tuesdays, and Thursdays or as directed Anticoagulation Clinic. 12/18/21   Freada Bergeron, MD  ? ? ?Physical Exam: ?Vitals:  ? 01/01/22 1715 01/01/22 1745 01/01/22 200

## 2022-01-02 DIAGNOSIS — D649 Anemia, unspecified: Secondary | ICD-10-CM | POA: Diagnosis present

## 2022-01-02 DIAGNOSIS — S82141A Displaced bicondylar fracture of right tibia, initial encounter for closed fracture: Secondary | ICD-10-CM | POA: Diagnosis not present

## 2022-01-02 LAB — CBC
HCT: 31.3 % — ABNORMAL LOW (ref 36.0–46.0)
Hemoglobin: 10.6 g/dL — ABNORMAL LOW (ref 12.0–15.0)
MCH: 32.4 pg (ref 26.0–34.0)
MCHC: 33.9 g/dL (ref 30.0–36.0)
MCV: 95.7 fL (ref 80.0–100.0)
Platelets: 154 10*3/uL (ref 150–400)
RBC: 3.27 MIL/uL — ABNORMAL LOW (ref 3.87–5.11)
RDW: 13.3 % (ref 11.5–15.5)
WBC: 7.9 10*3/uL (ref 4.0–10.5)
nRBC: 0 % (ref 0.0–0.2)

## 2022-01-02 LAB — PROTIME-INR
INR: 3 — ABNORMAL HIGH (ref 0.8–1.2)
Prothrombin Time: 31.1 seconds — ABNORMAL HIGH (ref 11.4–15.2)

## 2022-01-02 LAB — SURGICAL PCR SCREEN
MRSA, PCR: NEGATIVE
Staphylococcus aureus: NEGATIVE

## 2022-01-02 LAB — GLUCOSE, CAPILLARY
Glucose-Capillary: 107 mg/dL — ABNORMAL HIGH (ref 70–99)
Glucose-Capillary: 103 mg/dL — ABNORMAL HIGH (ref 70–99)

## 2022-01-02 MED ORDER — ENSURE ENLIVE PO LIQD
237.0000 mL | Freq: Two times a day (BID) | ORAL | Status: DC
  Administered 2022-01-02 – 2022-01-12 (×19): 237 mL via ORAL

## 2022-01-02 NOTE — Progress Notes (Signed)
Initial Nutrition Assessment ? ?DOCUMENTATION CODES:  ? ?Non-severe (moderate) malnutrition in context of chronic illness ? ?INTERVENTION:  ? ?- Liberalize diet to 2 gram sodium to provide more food options ? ?- Ensure Enlive po BID, each supplement provides 350 kcal and 20 grams of protein ? ?- Encourage PO intake ? ?NUTRITION DIAGNOSIS:  ? ?Moderate Malnutrition related to chronic illness (CHF, COPD) as evidenced by moderate fat depletion, moderate muscle depletion. ? ?GOAL:  ? ?Patient will meet greater than or equal to 90% of their needs ? ?MONITOR:  ? ?PO intake, Supplement acceptance, Labs, Weight trends ? ?REASON FOR ASSESSMENT:  ? ?Consult ?Hip fracture protocol ? ?ASSESSMENT:  ? ?71 year old female who presented to the ED on 3/16 after a fall. PMH of mitral valve replacement, CHF, COPD, IBS, multiple sclerosis, atrial fibrillation. Pt admitted with tibial plateau fracture, proximal tibial fracture, fibular fracture, and lateral malleolus fracture. ? ?Noted plan for OR for ORIF of the plateau and IM nailing of the tibia early next week. ? ?Spoke with pt at bedside. Pt in good spirits and reports normal appetite. Noted 75% completed breakfast meal tray in room. Pt reports that she eats healthfully and does not eat any red meat. Pt does eat chicken, other poultry, fish, and eggs. Pt follows a low sodium diet at home. She reports difficulty standing for long periods to cook meals and uses a walker at baseline. She states that for this reason, she does not cook as much as she used to. Pt eats a lot of "healthy frozen meals" from grocery stores like [employer]. She also eats unsalted nuts and protein bars. Pt drinks 2 Boost High Protein supplements daily and would like to receive something similar here. RD to order Ensure Enlive BID. ? ?Discussed Heart Healthy diet with pt. Pt willing to have diet liberalized to 2 gram sodium to provide more food options at meals. RD to liberalize diet in the setting of malnutrition  and increased nutrient needs related to hip fx. ? ?Pt reports that she takes multiple vitamins and supplements including turmeric, vitamin D, omega 3, calcium, and magnesium. She does not take a daily MVI due to the number of other vitamins that she takes. ? ?Pt received an important phone call at time of RD visit so visit was cut short. Unable to obtain weight history at this time. Reviewed available weight history in chart. Weight appears fairly stable over the last year between 64-69 kg. No real weight loss trends noted. ? ?Medications reviewed and include: calcium carbonate 1250 mg daily, cholecalciferol 2000 units every other day, cholecalciferol 4000 units every other day, magnesium oxide 200 mg daily ? ?Labs reviewed: INR 3.0 ? ?NUTRITION - FOCUSED PHYSICAL EXAM: ? ?Flowsheet Row Most Recent Value  ?Orbital Region Moderate depletion  ?Upper Arm Region Mild depletion  ?Thoracic and Lumbar Region Moderate depletion  ?Buccal Region Mild depletion  ?Temple Region Moderate depletion  ?Clavicle Bone Region Moderate depletion  ?Clavicle and Acromion Bone Region Moderate depletion  ?Scapular Bone Region Moderate depletion  ?Dorsal Hand Moderate depletion  ?Patellar Region Unable to assess  ?Anterior Thigh Region Unable to assess  ?Posterior Calf Region Unable to assess  ? ?  ? ? ?Diet Order:   ?Diet Order   ? ?       ?  Diet 2 gram sodium Room service appropriate? Yes; Fluid consistency: Thin  Diet effective now       ?  ? ?  ?  ? ?  ? ? ?  EDUCATION NEEDS:  ? ?Education needs have been addressed ? ?Skin:  Skin Assessment: Reviewed RN Assessment ? ?Last BM:  12/30/21 ? ?Height:  ? ?Ht Readings from Last 1 Encounters:  ?01/01/22 '5\' 7"'$  (1.702 m)  ? ? ?Weight:  ? ?Wt Readings from Last 1 Encounters:  ?01/01/22 66.6 kg  ? ? ?BMI:  Body mass index is 23 kg/m?. ? ?Estimated Nutritional Needs:  ? ?Kcal:  1700-1900 ? ?Protein:  85-100 grams ? ?Fluid:  1.7-1.9 L ? ? ? ?Gustavus Bryant, MS, RD, LDN ?Inpatient Clinical  Dietitian ?Please see AMiON for contact information. ? ?

## 2022-01-02 NOTE — Assessment & Plan Note (Addendum)
Mechanical mitral valve.  ?Patient was placed on IV heparin for bridge anticoagulation with good toleration. ?Post procedure warfarin was resumed and INR was closely followed up.  ? ?At the time of her discharge her INR is therapeutic at 3.5.  ? ?Target INR is 2.5 to 3.5., plan to continue warfarin and close follow up on INR.  ?

## 2022-01-02 NOTE — Consult Note (Signed)
Reason for Consult:Right tib/fib fxs ?Referring Physician: Wylene Simmer ?Time called: 3299 ?Time at bedside: 0904 ? ? ?[name] [name] is an 72 y.o. female.  ?HPI: [name] [name] was at home and lost her balance when she was reaching for something. She fell and had immediate right lower leg pain and could not get up. She was brought to the ED where x-rays showed tibia plateau, tibia shaft, and lat malleolus fxs and orthopedic surgery was consulted. Due to the complex nature of the fractures orthopedic trauma consultation was requested. She lives at home alone and generally ambulates with a RW. ? ?Past Medical History:  ?Diagnosis Date  ? Anxiety   ? Arthritis   ? knees  ? Breast cancer ([place]) 1999  ? CHF (congestive heart failure) ([place])   ? Related to severe mitral regurgitation, April, 2013  ? COPD (chronic obstructive pulmonary disease) ([place])   ? COPD with emphysema.. Assess by pulmonary team in the hospital April, 2013  ? Ejection fraction   ? EF 60%, echo, April, 2013, with severe MR before mitral valve replacement  ? Herpes   ? Hypothyroidism   ? IBS (irritable bowel syndrome)   ? Mitral valve regurgitation   ? Mitral valve replacement April, 2013, Mitral valve prolapse  ? Multiple sclerosis ([place])   ? Neurogenic bladder   ? Osteoporosis   ? Ovarian cyst   ? Paroxysmal atrial fibrillation ([place])   ? Rapid atrial fibrillation in-hospital, Rapid cardioversion,  before mitral valve surgery  ? Pulmonary hypertension ([place])   ? Echo, April, 2013, before mitral valve surgery  ? S/P Maze operation for atrial fibrillation 01/26/2012  ? Complete biatrial lesion set using cryothermy via right mini thoracotomy  ? S/P mitral valve replacement 01/26/2012  ? 70m Sorin Carbomedics Optiform mechanical prosthesis via right mini thoracotomy  ? Warfarin anticoagulation   ? Mechanical mitral prosthesis, April, 20136  ? ? ?Past Surgical History:  ?Procedure Laterality Date  ? BREAST LUMPECTOMY Right 1999  ? with sent.node, and axillary dissection  (20)  ? CHEST TUBE INSERTION  01/26/2012  ? Procedure: CHEST TUBE INSERTION;  Surgeon: CRexene Alberts MD;  [place] [facility]  Service: Open Heart Surgery;  Laterality: Left;  ? COLONOSCOPY  2010  ? "normal"  ? CYSTOSCOPY  1992  ? LAPAROSCOPIC OVARIAN CYSTECTOMY  1978  ? urethral stricture repair  ? LEFT AND RIGHT HEART CATHETERIZATION WITH CORONARY ANGIOGRAM N/A 01/20/2012  ? Procedure: LEFT AND RIGHT HEART CATHETERIZATION WITH CORONARY ANGIOGRAM;  Surgeon: CBurnell Blanks MD;  [facility];  Service: Cardiovascular;  Laterality: N/A;  ? LYMPHADENECTOMY    ? MAZE  01/26/2012  ? Procedure: MAZE;  Surgeon: CRexene Alberts MD;  [place]: [facility]  Service: Open Heart Surgery;  Laterality: N/A;  ? MITRAL VALVE REPLACEMENT  01/26/2012  ? Procedure: MINIMALLY INVASIVE MITRAL VALVE (MV) REPLACEMENT;  Surgeon: CRexene Alberts MD;  [place] [facility]  Service: Open Heart Surgery;  Laterality: Right;  ? TEE WITHOUT CARDIOVERSION  01/19/2012  ? Procedure: TRANSESOPHAGEAL ECHOCARDIOGRAM (TEE);  Surgeon: Peter M JMartinique MD;  [facility];  Service: Cardiovascular;  Laterality: N/A;  ? TONSILLECTOMY  1970  ? [place] WRIST SURGERY Right 2012  ? ? ?Family History  ?Problem Relation Age of Onset  ? Heart disease Father   ?     cardiac arrest   ? CAD Father   ? Hypertension Father   ? CAD Mother   ?  5 stents and numerous bypass surgery  ? Hypertension Mother   ? Hyperlipidemia Mother   ? CAD Other   ? Breast cancer Maternal Grandmother   ? Breast cancer Maternal Aunt   ? ? ?Social History:  reports that she has never smoked. She has never used smokeless tobacco. She reports that she does not drink alcohol and does not use drugs. ? ?Allergies:  ?Allergies  ?Allergen Reactions  ? Erythromycin Nausea And Vomiting  ? Atorvastatin Other (See Comments)  ?  Pt reports causes body to be stiff, joints ache, aching in knees and ankles, feels more fatigued.   ? Lactose Intolerance (Gi) Other (See  Comments)  ?  Upset stomach - yoghurt  ? Valsartan Other (See Comments)  ?  Pt reports caused her depression  ? ? ?Medications: I have reviewed the patient's current medications. ? ?Results for orders placed or performed during the hospital encounter of 01/01/22 (from the past 48 hour(s))  ?CBC with Differential     Status: Abnormal  ? Collection Time: 01/01/22  5:28 PM  ?Result Value Ref Range  ? WBC 10.2 4.0 - 10.5 K/uL  ? RBC 3.80 (L) 3.87 - 5.11 MIL/uL  ? Hemoglobin 12.6 12.0 - 15.0 g/dL  ? HCT 36.8 36.0 - 46.0 %  ? MCV 96.8 80.0 - 100.0 fL  ? MCH 33.2 26.0 - 34.0 pg  ? MCHC 34.2 30.0 - 36.0 g/dL  ? RDW 13.4 11.5 - 15.5 %  ? Platelets 200 150 - 400 K/uL  ? nRBC 0.0 0.0 - 0.2 %  ? Neutrophils Relative % 79 %  ? Neutro Abs 8.1 (H) 1.7 - 7.7 K/uL  ? Lymphocytes Relative 10 %  ? Lymphs Abs 1.0 0.7 - 4.0 K/uL  ? Monocytes Relative 9 %  ? Monocytes Absolute 0.9 0.1 - 1.0 K/uL  ? Eosinophils Relative 1 %  ? Eosinophils Absolute 0.1 0.0 - 0.5 K/uL  ? Basophils Relative 1 %  ? Basophils Absolute 0.1 0.0 - 0.1 K/uL  ? Immature Granulocytes 0 %  ? Abs Immature Granulocytes 0.03 0.00 - 0.07 K/uL  ?  Comment: Performed at [facility], [address], [place], [place] [address]  ?Basic metabolic panel     Status: Abnormal  ? Collection Time: 01/01/22  5:28 PM  ?Result Value Ref Range  ? Sodium 140 135 - 145 mmol/L  ? Potassium 3.5 3.5 - 5.1 mmol/L  ? Chloride 102 98 - 111 mmol/L  ? CO2 29 22 - 32 mmol/L  ? Glucose, Bld 125 (H) 70 - 99 mg/dL  ?  Comment: Glucose reference range applies only to samples taken after fasting for at least 8 hours.  ? BUN 22 8 - 23 mg/dL  ? Creatinine, Ser 0.73 0.44 - 1.00 mg/dL  ? Calcium 9.0 8.9 - 10.3 mg/dL  ? GFR, Estimated >60 >60 mL/min  ?  Comment: (NOTE) ?Calculated using the CKD-EPI Creatinine Equation (2021) ?  ? Anion gap 9 5 - 15  ?  Comment: Performed at [facility], [address] [place], [place] [address]  ?Protime-INR     Status: Abnormal  ? Collection Time:  01/01/22  5:28 PM  ?Result Value Ref Range  ? Prothrombin Time 30.3 (H) 11.4 - 15.2 seconds  ? INR 2.9 (H) 0.8 - 1.2  ?  Comment: (NOTE) ?INR goal varies based on device and disease states. ?Performed at [facility], [address] ?  [place], [place] [address] ?  ?Resp Panel by RT-PCR (Flu A&B, Covid) Nasopharyngeal Swab     Status: None  ? Collection Time: 01/01/22  5:29 PM  ? Specimen: Nasopharyngeal Swab; Nasopharyngeal(NP) swabs in vial transport medium  ?Result Value Ref Range  ? SARS Coronavirus 2 by RT PCR NEGATIVE NEGATIVE  ?  Comment: (NOTE) ?SARS-CoV-2 target nucleic acids are NOT DETECTED. ? ?The SARS-CoV-2 RNA is generally detectable in upper respiratory ?specimens during the acute phase of infection. The lowest ?concentration of SARS-CoV-2 viral copies this assay can detect is ?138 copies/mL. A negative result does not preclude SARS-Cov-2 ?infection and should not be used as the sole basis for treatment or ?other patient management decisions. A negative result may occur with  ?improper specimen collection/handling, submission of specimen other ?than nasopharyngeal swab, presence of viral mutation(s) within the ?areas targeted by this assay, and inadequate number of viral ?copies(<138 copies/mL). A negative result must be combined with ?clinical observations, patient history, and epidemiological ?information. The expected result is Negative. ? ?Fact Sheet for Patients:  ?EntrepreneurPulse.com.au ? ?Fact Sheet for Healthcare Providers:  ?IncredibleEmployment.be ? ?This test is no t yet approved or cleared by the [place] FDA and  ?has been authorized for detection and/or diagnosis of SARS-CoV-2 by ?FDA under an Emergency Use Authorization (EUA). This EUA will remain  ?in effect (meaning this test can be used) for the duration of the ?COVID-19 declaration under Section 564(b)(1) of the Act, 21 ?U.S.C.section 360bbb-3(b)(1), unless the authorization is  terminated  ?or revoked sooner.  ? ? ?  ? Influenza A by PCR NEGATIVE NEGATIVE  ? Influenza B by PCR NEGATIVE NEGATIVE  ?  Comment: (NOTE) ?The Xpert Xpress SARS-CoV-2/FLU/RSV plus assay is intended as an

## 2022-01-02 NOTE — TOC CAGE-AID Note (Signed)
Transition of Care (TOC) - CAGE-AID Screening ? ? ?Patient Details  ?Name: [name] [name] ?MRN: [id number] ?Date of Birth: 26-Sep-1951 ? ?Transition of Care (TOC) CM/SW Contact:    ?Charlie Seda C Tarpley-Carter, LCSWA ?Phone Number: ?01/02/2022, 2:03 PM ? ? ?Clinical Narrative: ?Pt participated in [place].  Pt stated she does not use substance or ETOH.  Pt was not offered resources, due to no usage of substance or ETOH.    ? ?Passenger transport manager, MSW, LCSW-A ?Pronouns:  She/Her/Hers ?[facility]itions of Care ?Clinical Social Worker ?Direct Number:  [phone] ?[email or web address]  ? ?CAGE-AID Screening: ?  ? ?Have You Ever Felt You Ought to Cut Down on Your Drinking or Drug Use?: No ?Have People Annoyed You By Critizing Your Drinking Or Drug Use?: No ?Have You Felt Bad Or Guilty About Your Drinking Or Drug Use?: No ?Have You Ever Had a Drink or Used Drugs First Thing In The Morning to Steady Your Nerves or to Get Rid of a Hangover?: No ?CAGE-AID Score: 0 ? ?Substance Abuse Education Offered: No ? ?  ? ? ? ? ? ? ?

## 2022-01-02 NOTE — Progress Notes (Signed)
ANTICOAGULATION CONSULT NOTE - Initial Consult ? ?Pharmacy Consult for IV Heparin; ?Indication:  mechanical valve ; ? ?Allergies  ?Allergen Reactions  ? Erythromycin Nausea And Vomiting  ? Atorvastatin Other (See Comments)  ?  Pt reports causes body to be stiff, joints ache, aching in knees and ankles, feels more fatigued.   ? Lactose Intolerance (Gi) Other (See Comments)  ?  Upset stomach - yoghurt  ? Valsartan Other (See Comments)  ?  Pt reports caused her depression  ? ? ?Patient Measurements: ?Height: '5\' 7"'$  (170.2 cm) ?Weight: 66.6 kg (146 lb 13.2 oz) ?IBW/kg (Calculated) : 61.6 ?Heparin Dosing Weight: 66.6 kg ? ?Vital Signs: ?Temp: 98 ?F (36.7 ?C) (03/17 0757) ?Temp Source: Oral (03/17 0757) ?BP: 139/62 (03/17 0757) ?Pulse Rate: 80 (03/17 0757) ? ?Labs: ?Recent Labs  ?  01/01/22 ?1728 01/02/22 ?0246  ?HGB 12.6 10.6*  ?HCT 36.8 31.3*  ?PLT 200 154  ?LABPROT 30.3* 31.1*  ?INR 2.9* 3.0*  ?CREATININE 0.73  --   ? ? ? ?Estimated Creatinine Clearance: 61.8 mL/min (by C-G formula based on SCr of 0.73 mg/dL). ? ? ?Medical History: ?Past Medical History:  ?Diagnosis Date  ? Anxiety   ? Arthritis   ? knees  ? Breast cancer ([facility]) 1999  ? CHF (congestive heart failure) ([place])   ? Related to severe mitral regurgitation, April, 2013  ? COPD (chronic obstructive pulmonary disease) ([place])   ? COPD with emphysema.. Assess by pulmonary team in the hospital April, 2013  ? Ejection fraction   ? EF 60%, echo, April, 2013, with severe MR before mitral valve replacement  ? Herpes   ? Hypothyroidism   ? IBS (irritable bowel syndrome)   ? Mitral valve regurgitation   ? Mitral valve replacement April, 2013, Mitral valve prolapse  ? Multiple sclerosis ([place])   ? Neurogenic bladder   ? Osteoporosis   ? Ovarian cyst   ? Paroxysmal atrial fibrillation ([place])   ? Rapid atrial fibrillation in-hospital, Rapid cardioversion,  before mitral valve surgery  ? Pulmonary hypertension ([place])   ? Echo, April, 2013, before mitral valve surgery  ? S/P Maze  operation for atrial fibrillation 01/26/2012  ? Complete biatrial lesion set using cryothermy via right mini thoracotomy  ? S/P mitral valve replacement 01/26/2012  ? 43m Sorin [employer] Optiform mechanical prosthesis via right mini thoracotomy  ? Warfarin anticoagulation   ? Mechanical mitral prosthesis, April, 20136  ? ? ?Medications:  ?Scheduled:  ? calcium carbonate  1,250 mg Oral Q breakfast  ? cholecalciferol  2,000 Units Oral QODAY  ? [START ON 01/03/2022] cholecalciferol  4,000 Units Oral QODAY  ? fluticasone  1 spray Each Nare Daily  ? lisinopril  5 mg Oral Daily  ? magnesium oxide  200 mg Oral Daily  ? metoprolol tartrate  25 mg Oral BID  ? thyroid  90 mg Oral Q0600  ? ? ?Assessment: ?71years of age female with a mechanical mitral valve who needs surgery for fracture of R-knee after fall. Pharmacy consulted to reverse INR (chronic Coumadin) for surgery and transition to IV Heparin therapy. Vit K d/c'd at patient request per MD note. INR up slightly to 3 this AM   ? ?INR 3 (at goal for valve) but likely need < 1. 5 for surgery.  ?Patient is on chronic Coumadin therapy  -  7.'5mg'$  Sun/Tues/Thur and '5mg'$  all other days.  ? ?Addendum:  ?Dr. GAlcario Droughtmessaged that Surgery wants to hold off on Vitamin K reversal and plans for  surgery early next week.  ? ?Goal of Therapy:  ?Heparin level 0.3-0.7 units/ml ?Monitor platelets by anticoagulation protocol: Yes ?  ?Plan:  ?Cancel Vitamin K.  ?INR daily ?Start IV Heparin when INR less that <2.5.  ?Daily CBC.  ? ?Keven Soucy A. Levada Dy, PharmD, BCPS, FNKF ?Clinical Pharmacist ?[facility] ?Please utilize Amion for appropriate phone number to reach the unit pharmacist ([place]) ? ? ? ?

## 2022-01-02 NOTE — Assessment & Plan Note (Addendum)
Stable hgb and hct at 10,7 and hct at 31,5  ?Follow cell count as outpatient.  ?

## 2022-01-02 NOTE — Progress Notes (Signed)
?PROGRESS NOTE ? ?[name] [name]  QMG:[id number] DOB: 12-03-1950 DOA: 01/01/2022 ?PCP: Howard Pouch A, DO  ? ?Brief Narrative: ?Patient is a 71 year old female with history of paroxysmal A-fib status post Maze procedure, mitral valve replacement with mechanical valve, on Coumadin, multiple strokes, diastolic congestive heart failure, hypertension who presented from home after a fall followed by right knee pain.  Imaging showed right tibial plateau fracture.  Orthopedics consulted, planning for ORIF after optimization of INR, likely next week ? ?Assessment & Plan: ? ?Principal Problem: ?  Tibial plateau fracture, right ?Active Problems: ?  Long term current use of anticoagulant ?  CHF (congestive heart failure) ([facility]) ?  Hypothyroidism ?  Essential hypertension ?  S/P mitral valve replacement ?  Paroxysmal atrial fibrillation ([facility]) ?  Normocytic anemia ? ? ?Assessment and Plan: ?* Tibial plateau fracture, right ?Tibial plateau fracture, proximal tibial fracture, fibular fracture, lateral malleolus fracture.  Ortho following, planning for ORIF when INR is febrile, likely next week.  Orthopedics said okay to work with PT/OT next but NWB.  Currently on splint ? ?Long term current use of anticoagulant ?Coumadin use in setting of PAF, mechanical MVR, prior strokes.  Pt high risk for further strokes/thrombosis in the mitral valve . Coumadin on hold.  INR in the range of 3.  When INR comes around/below 2.5, need to start on heparin drip because of presence of mechanical mitral valve.  No need to start on heparin drip today because INR is 3.  Will avoid giving vitamin K for reversal because of difficulty in bringing INR back up ? ?CHF (congestive heart failure) ([place]) ?Normal LVEF and functioning MV prosthesis as of 2d echo in 2021. ? ?Essential hypertension ?Cont home BP meds ? ?Hypothyroidism ?Cont thyroid medication ? ?Normocytic anemia ?Hemoglobin dropped to the range of 10 from 12.  Could be from blood loss at the  fracture site.  Continue monitor hemoglobin ? ?Paroxysmal atrial fibrillation ([facility]) ?Currently in normal sinus rhythm.  Rate is controlled with metoprolol.  Anticoagulation on hold ? ?S/P mitral valve replacement ?Mechanical mitral valve.  On Coumadin currently on hold ? ? ? ? ?  ?  ? ?DVT prophylaxis:None ? ? ?  Code Status: Full Code ? ?Family Communication: None at bedside, patient says she just has friends,, no need to call anybody for now ? ?Patient status: Inpatient ? ?Patient is from : Home ? ?Anticipated discharge to: Skilled nursing facility versus home health ? ?Estimated DC date: Not sure ? ? ?Consultants: Orthopedics ? ?Procedures: None yet ? ?Antimicrobials:  ?Anti-infectives (From admission, onward)  ? ? None  ? ?  ? ? ?Subjective: ? ?Patient seen and examined the bedside this morning.  Hemodynamically stable.  Pain well controlled.  Denies new complaints today ? ? ?Objective: ?Vitals:  ? 01/01/22 2000 01/01/22 2019 01/02/22 0417 01/02/22 0757  ?BP:  (!) 151/85 122/70 139/62  ?Pulse:  99 79 80  ?Resp:  20  17  ?Temp:  98 ?F (36.7 ?C)  98 ?F (36.7 ?C)  ?TempSrc:  Oral  Oral  ?SpO2:  99% 97% 95%  ?Weight: 66.6 kg     ?Height: '5\' 7"'$  (1.702 m)     ? ? ?Intake/Output Summary (Last 24 hours) at 01/02/2022 1132 ?Last data filed at 01/02/2022 0816 ?Gross per 24 hour  ?Intake 360 ml  ?Output --  ?Net 360 ml  ? ?Filed Weights  ? 01/01/22 1458 01/01/22 2000  ?Weight: 59.9 kg 66.6 kg  ? ? ?Examination: ? ?General  exam: Overall comfortable, not in distress, pleasant female ?HEENT: PERRL ?Respiratory system:  no wheezes or crackles  ?Cardiovascular system: S1 & S2 heard, RRR.  ?Gastrointestinal system: Abdomen is nondistended, soft and nontender. ?Central nervous system: Alert and oriented ?Extremities: No edema, no clubbing ,no cyanosis, right lower extremity wrapped with dressing, splint ?Skin: No rashes, no ulcers,no icterus   ? ? ?Data Reviewed: I have personally reviewed following labs and imaging  studies ? ?CBC: ?Recent Labs  ?Lab 01/01/22 ?1728 01/02/22 ?0246  ?WBC 10.2 7.9  ?NEUTROABS 8.1*  --   ?HGB 12.6 10.6*  ?HCT 36.8 31.3*  ?MCV 96.8 95.7  ?PLT 200 154  ? ?Basic Metabolic Panel: ?Recent Labs  ?Lab 01/01/22 ?1728  ?NA 140  ?K 3.5  ?CL 102  ?CO2 29  ?GLUCOSE 125*  ?BUN 22  ?CREATININE 0.73  ?CALCIUM 9.0  ? ? ? ?Recent Results (from the past 240 hour(s))  ?Resp Panel by RT-PCR (Flu A&B, Covid) Nasopharyngeal Swab     Status: None  ? Collection Time: 01/01/22  5:29 PM  ? Specimen: Nasopharyngeal Swab; Nasopharyngeal(NP) swabs in vial transport medium  ?Result Value Ref Range Status  ? SARS Coronavirus 2 by RT PCR NEGATIVE NEGATIVE Final  ?  Comment: (NOTE) ?SARS-CoV-2 target nucleic acids are NOT DETECTED. ? ?The SARS-CoV-2 RNA is generally detectable in upper respiratory ?specimens during the acute phase of infection. The lowest ?concentration of SARS-CoV-2 viral copies this assay can detect is ?138 copies/mL. A negative result does not preclude SARS-Cov-2 ?infection and should not be used as the sole basis for treatment or ?other patient management decisions. A negative result may occur with  ?improper specimen collection/handling, submission of specimen other ?than nasopharyngeal swab, presence of viral mutation(s) within the ?areas targeted by this assay, and inadequate number of viral ?copies(<138 copies/mL). A negative result must be combined with ?clinical observations, patient history, and epidemiological ?information. The expected result is Negative. ? ?Fact Sheet for Patients:  ?EntrepreneurPulse.com.au ? ?Fact Sheet for Healthcare Providers:  ?IncredibleEmployment.be ? ?This test is no t yet approved or cleared by the [place] FDA and  ?has been authorized for detection and/or diagnosis of SARS-CoV-2 by ?FDA under an Emergency Use Authorization (EUA). This EUA will remain  ?in effect (meaning this test can be used) for the duration of the ?COVID-19  declaration under Section 564(b)(1) of the Act, 21 ?U.S.C.section 360bbb-3(b)(1), unless the authorization is terminated  ?or revoked sooner.  ? ? ?  ? Influenza A by PCR NEGATIVE NEGATIVE Final  ? Influenza B by PCR NEGATIVE NEGATIVE Final  ?  Comment: (NOTE) ?The Xpert Xpress SARS-CoV-2/FLU/RSV plus assay is intended as an aid ?in the diagnosis of influenza from Nasopharyngeal swab specimens and ?should not be used as a sole basis for treatment. Nasal washings and ?aspirates are unacceptable for Xpert Xpress SARS-CoV-2/FLU/RSV ?testing. ? ?Fact Sheet for Patients: ?EntrepreneurPulse.com.au ? ?Fact Sheet for Healthcare Providers: ?IncredibleEmployment.be ? ?This test is not yet approved or cleared by the [place] FDA and ?has been authorized for detection and/or diagnosis of SARS-CoV-2 by ?FDA under an Emergency Use Authorization (EUA). This EUA will remain ?in effect (meaning this test can be used) for the duration of the ?COVID-19 declaration under Section 564(b)(1) of the Act, 21 U.S.C. ?section 360bbb-3(b)(1), unless the authorization is terminated or ?revoked. ? ?Performed at [facility], [address] ?[place], [place] [address] ?  ?  ? ?Radiology Studies: ?DG Tibia/Fibula Right ? ?Result Date: 01/01/2022 ?CLINICAL DATA:  Fall. EXAM: RIGHT TIBIA AND FIBULA - 2 VIEW COMPARISON:  None. FINDINGS: There is an acute oblique/vertical fracture extending from the central tibial plateau into the proximal diaphysis of the tibia. This is nondisplaced. There is no dislocation. There are mild degenerative changes of the right knee including chondrocalcinosis in the medial and lateral compartments. There is soft tissue swelling of the lower extremity and knee. IMPRESSION: 1. Acute nondisplaced fracture involving the tibial plateau and proximal tibial diaphysis. Electronically Signed   By: Ronney Asters M.D.   On: 01/01/2022 15:52  ? ?CT Tibia Fibula Right Wo  Contrast ? ?Result Date: 01/01/2022 ?CLINICAL DATA:  Fall yesterday. Lower leg pain with proximal tibial fracture on radiographs. EXAM: CT OF THE LOWER RIGHT EXTREMITY WITHOUT CONTRAST TECHNIQUE: Multidetector CT imaging of the right lower l

## 2022-01-02 NOTE — Assessment & Plan Note (Addendum)
Her heart rate remained well controlled with metoprolol for rate control. ?Continue anticoagulation with warfarin.  ?

## 2022-01-03 DIAGNOSIS — S82141D Displaced bicondylar fracture of right tibia, subsequent encounter for closed fracture with routine healing: Secondary | ICD-10-CM

## 2022-01-03 DIAGNOSIS — E440 Moderate protein-calorie malnutrition: Secondary | ICD-10-CM | POA: Diagnosis present

## 2022-01-03 LAB — CBC
HCT: 31.4 % — ABNORMAL LOW (ref 36.0–46.0)
Hemoglobin: 10.8 g/dL — ABNORMAL LOW (ref 12.0–15.0)
MCH: 32.7 pg (ref 26.0–34.0)
MCHC: 34.4 g/dL (ref 30.0–36.0)
MCV: 95.2 fL (ref 80.0–100.0)
Platelets: 163 10*3/uL (ref 150–400)
RBC: 3.3 MIL/uL — ABNORMAL LOW (ref 3.87–5.11)
RDW: 13.2 % (ref 11.5–15.5)
WBC: 7.7 10*3/uL (ref 4.0–10.5)
nRBC: 0 % (ref 0.0–0.2)

## 2022-01-03 LAB — HEPARIN LEVEL (UNFRACTIONATED): Heparin Unfractionated: <0.1 IU/mL — ABNORMAL LOW (ref 0.30–0.70)

## 2022-01-03 LAB — PROTIME-INR
INR: 1.5 — ABNORMAL HIGH (ref 0.8–1.2)
Prothrombin Time: 18 seconds — ABNORMAL HIGH (ref 11.4–15.2)

## 2022-01-03 MED ORDER — LIP MEDEX EX OINT
TOPICAL_OINTMENT | CUTANEOUS | Status: DC | PRN
  Filled 2022-01-03: qty 7

## 2022-01-03 MED ORDER — HEPARIN BOLUS VIA INFUSION
2000.0000 [IU] | Freq: Once | INTRAVENOUS | Status: AC
Start: 2022-01-03 — End: 2022-01-03
  Administered 2022-01-03: 2000 [IU] via INTRAVENOUS
  Filled 2022-01-03: qty 2000

## 2022-01-03 MED ORDER — HEPARIN BOLUS VIA INFUSION
3000.0000 [IU] | Freq: Once | INTRAVENOUS | Status: AC
  Administered 2022-01-03: 3000 [IU] via INTRAVENOUS
  Filled 2022-01-03: qty 3000

## 2022-01-03 MED ORDER — HEPARIN (PORCINE) 25000 UT/250ML-% IV SOLN
1100.0000 [IU]/h | INTRAVENOUS | Status: DC
  Administered 2022-01-03: 900 [IU]/h via INTRAVENOUS
  Administered 2022-01-04: 1100 [IU]/h via INTRAVENOUS
  Filled 2022-01-03 (×2): qty 250

## 2022-01-03 NOTE — Progress Notes (Signed)
?PROGRESS NOTE ? ?[name] [name]  FIE:[id number] DOB: [birth year] DOA: 01/01/2022 ?PCP: Howard Pouch A, DO  ? ?Brief Narrative: ?Patient is a 71 year old female with history of paroxysmal A-fib status post Maze procedure, mitral valve replacement with mechanical valve, on Coumadin, history of CVA, chronic diastolic CHF, hypertension who presented from home after a fall followed by right knee pain.  Imaging showed right tibial plateau fracture.  Orthopedics consulted, planning for ORIF after optimization of INR, likely next week ? ?Assessment and Plan: ? ?* Tibial plateau fracture, right ?Tibial plateau fracture, proximal tibial fracture, fibular fracture, lateral malleolus fracture.   ?-Ortho following, planning for ORIF when INR is febrile, likely next week.   ?-Coumadin on hold, INR down to 1.5 today ? ?Mechanical MVR ?Long term current use of anticoagulant ?Coumadin use in setting of PAF, mechanical MVR, prior CVA.   ?-She is at high risk for further CVA/thrombosis in the mitral valve .  ?-Coumadin on hold.,  INR down to 1.5 today, will start IV heparin ?-Continue IV heparin until surgery ? ?Chronic diastolic CHF  ?Normal LVEF and functioning MV prosthesis as of 2d echo in 2021. ?-Euvolemic, monitor ? ?Essential hypertension ?-Continue Toprol and lisinopril ? ?Hypothyroidism ?Cont thyroid medication ? ?Normocytic anemia ?Hemoglobin dropped to the range of 10 from 12.  Could be from blood loss at the fracture site.  Continue monitor hemoglobin ? ?Paroxysmal atrial fibrillation ([facility]) ?Currently in normal sinus rhythm.  Rate is controlled with metoprolol.  Anticoagulation on hold ? ?S/P mitral valve replacement ?Mechanical mitral valve.  On Coumadin currently on hold, see discussion above ? ?Nutrition Problem: Moderate Malnutrition ?Etiology: chronic illness (CHF, COPD) ?  ? ?DVT prophylaxis: Heparin GTT ? ? ?  Code Status: Full Code ?Family Communication: Discussed patient detail, no family at bedside ?Anticipated  discharge to: Skilled nursing facility versus home health ? ? ?Consultants: Orthopedics ? ?Procedures: None yet ? ?Antimicrobials:  ?Anti-infectives (From admission, onward)  ? ? None  ? ?  ? ? ?Subjective: ? ?-Feels okay, denies any complaints, no dyspnea, using Tylenol for pain control ? ? ?Objective: ?Vitals:  ? 01/02/22 0757 01/02/22 2150 01/02/22 2253 01/03/22 0842  ?BP: 139/62 (!) 111/50 122/60 131/65  ?Pulse: 80 79 89 83  ?Resp: '17 18 18 18  '$ ?Temp: 98 ?F (36.7 ?C) 98.3 ?F (36.8 ?C) 98.6 ?F (37 ?C) 97.7 ?F (36.5 ?C)  ?TempSrc: Oral Oral Oral Oral  ?SpO2: 95% 96% 98% 98%  ?Weight:      ?Height:      ? ? ?Intake/Output Summary (Last 24 hours) at 01/03/2022 1048 ?Last data filed at 01/02/2022 1300 ?Gross per 24 hour  ?Intake 360 ml  ?Output --  ?Net 360 ml  ? ?Filed Weights  ? 01/01/22 1458 01/01/22 2000  ?Weight: 59.9 kg 66.6 kg  ? ? ?Examination: ? ?General exam: Pleasant female sitting up in bed, AAOx3, no distress ?HEENT: No JVD ?CVS: S1-S2, regular rhythm ?Lungs: Clear bilaterally ?Abdomen: Soft, nontender, bowel sounds present ?Extremities: Right lower leg swollen, with dressing, splint ?Skin: No rashes, no ulcers,no icterus   ? ? ?Data Reviewed: I have personally reviewed following labs and imaging studies ? ?CBC: ?Recent Labs  ?Lab 01/01/22 ?1728 01/02/22 ?0246 01/03/22 ?0308  ?WBC 10.2 7.9 7.7  ?NEUTROABS 8.1*  --   --   ?HGB 12.6 10.6* 10.8*  ?HCT 36.8 31.3* 31.4*  ?MCV 96.8 95.7 95.2  ?PLT 200 154 163  ? ?Basic Metabolic Panel: ?Recent Labs  ?Lab 01/01/22 ?1728  ?NA  140  ?K 3.5  ?CL 102  ?CO2 29  ?GLUCOSE 125*  ?BUN 22  ?CREATININE 0.73  ?CALCIUM 9.0  ? ? ? ?Recent Results (from the past 240 hour(s))  ?Resp Panel by RT-PCR (Flu A&B, Covid) Nasopharyngeal Swab     Status: None  ? Collection Time: 01/01/22  5:29 PM  ? Specimen: Nasopharyngeal Swab; Nasopharyngeal(NP) swabs in vial transport medium  ?Result Value Ref Range Status  ? SARS Coronavirus 2 by RT PCR NEGATIVE NEGATIVE Final  ?  Comment:  (NOTE) ?SARS-CoV-2 target nucleic acids are NOT DETECTED. ? ?The SARS-CoV-2 RNA is generally detectable in upper respiratory ?specimens during the acute phase of infection. The lowest ?concentration of SARS-CoV-2 viral copies this assay can detect is ?138 copies/mL. A negative result does not preclude SARS-Cov-2 ?infection and should not be used as the sole basis for treatment or ?other patient management decisions. A negative result may occur with  ?improper specimen collection/handling, submission of specimen other ?than nasopharyngeal swab, presence of viral mutation(s) within the ?areas targeted by this assay, and inadequate number of viral ?copies(<138 copies/mL). A negative result must be combined with ?clinical observations, patient history, and epidemiological ?information. The expected result is Negative. ? ?Fact Sheet for Patients:  ?EntrepreneurPulse.com.au ? ?Fact Sheet for Healthcare Providers:  ?IncredibleEmployment.be ? ?This test is no t yet approved or cleared by the [place] FDA and  ?has been authorized for detection and/or diagnosis of SARS-CoV-2 by ?FDA under an Emergency Use Authorization (EUA). This EUA will remain  ?in effect (meaning this test can be used) for the duration of the ?COVID-19 declaration under Section 564(b)(1) of the Act, 21 ?U.S.C.section 360bbb-3(b)(1), unless the authorization is terminated  ?or revoked sooner.  ? ? ?  ? Influenza A by PCR NEGATIVE NEGATIVE Final  ? Influenza B by PCR NEGATIVE NEGATIVE Final  ?  Comment: (NOTE) ?The Xpert Xpress SARS-CoV-2/FLU/RSV plus assay is intended as an aid ?in the diagnosis of influenza from Nasopharyngeal swab specimens and ?should not be used as a sole basis for treatment. Nasal washings and ?aspirates are unacceptable for Xpert Xpress SARS-CoV-2/FLU/RSV ?testing. ? ?Fact Sheet for Patients: ?EntrepreneurPulse.com.au ? ?Fact Sheet for Healthcare  Providers: ?IncredibleEmployment.be ? ?This test is not yet approved or cleared by the [place] FDA and ?has been authorized for detection and/or diagnosis of SARS-CoV-2 by ?FDA under an Emergency Use Authorization (EUA). This EUA will remain ?in effect (meaning this test can be used) for the duration of the ?COVID-19 declaration under Section 564(b)(1) of the Act, 21 U.S.C. ?section 360bbb-3(b)(1), unless the authorization is terminated or ?revoked. ? ?Performed at [facility], [address] ?[place], [place] [address] ?  ?Surgical pcr screen     Status: Abnormal  ? Collection Time: 01/02/22  1:10 PM  ? Specimen: Nasal Mucosa; Nasal Swab  ?Result Value Ref Range Status  ? MRSA, PCR (A) NEGATIVE Final  ?  INVALID, UNABLE TO DETERMINE THE PRESENCE OF TARGET DUE TO SPECIMEN INTEGRITY. RECOLLECTION REQUESTED.  ? Staphylococcus aureus (A) NEGATIVE Final  ?  INVALID, UNABLE TO DETERMINE THE PRESENCE OF TARGET DUE TO SPECIMEN INTEGRITY. RECOLLECTION REQUESTED.  ?  Comment:  SPOKE TO RN FOR RECOLLECT [phone] '@1648'$  FH ?INVALID, UNABLE TO DETERMINE THE PRESENCE OF TARGET DUE TO SPECIMEN INTEGRITY. RECOLLECTION REQUESTED. ?Performed at [facility], [place] [address], [place], [place] [address] ?  ?Surgical pcr screen     Status: None  ? Collection Time: 01/02/22  4:50 PM  ?  Specimen: Nasal Mucosa; Nasal Swab  ?Result Value Ref Range Status  ? MRSA, PCR NEGATIVE NEGATIVE Final  ? Staphylococcus aureus NEGATIVE NEGATIVE Final  ?  Comment: (NOTE) ?The Xpert SA Assay (FDA approved for NASAL specimens in patients 53 ?years of age and older), is one component of a comprehensive ?surveillance program. It is not intended to diagnose infection nor to ?guide or monitor treatment. ?Performed at [facility], [place] [address] [place], [place] ?[address] ?  ?  ? ?Radiology Studies: ?DG Tibia/Fibula Right ? ?Result Date: 01/01/2022 ?CLINICAL DATA:  Fall. EXAM: RIGHT TIBIA AND FIBULA - 2 VIEW  COMPARISON:  None. FINDINGS: There is an acute oblique/vertical fracture extending from the central tibial plateau into the proximal diaphysis of the tibia. This is nondisplaced. There is no dislocation. There are mild degenerative changes of t

## 2022-01-03 NOTE — Progress Notes (Signed)
ANTICOAGULATION CONSULT NOTE ? ?Pharmacy Consult for Heparin (warfarin on hold) ?Indication:  mechanical valve ? ?Allergies  ?Allergen Reactions  ? Erythromycin Nausea And Vomiting  ? Atorvastatin Other (See Comments)  ?  Pt reports causes body to be stiff, joints ache, aching in knees and ankles, feels more fatigued.   ? Lactose Intolerance (Gi) Other (See Comments)  ?  Upset stomach - yoghurt  ? Valsartan Other (See Comments)  ?  Pt reports caused her depression  ? ? ?Patient Measurements: ?Height: '5\' 7"'$  (170.2 cm) ?Weight: 66.6 kg (146 lb 13.2 oz) ?IBW/kg (Calculated) : 61.6 ?Heparin Dosing Weight: 66 kg ? ?Vital Signs: ?Temp: 98 ?F (36.7 ?C) (03/18 1633) ?BP: 117/64 (03/18 1633) ?Pulse Rate: 74 (03/18 1633) ? ?Labs: ?Recent Labs  ?  01/01/22 ?1728 01/02/22 ?0246 01/03/22 ?0308 01/03/22 ?2057  ?HGB 12.6 10.6* 10.8*  --   ?HCT 36.8 31.3* 31.4*  --   ?PLT 200 154 163  --   ?LABPROT 30.3* 31.1* 18.0*  --   ?INR 2.9* 3.0* 1.5*  --   ?HEPARINUNFRC  --   --   --  <0.10*  ?CREATININE 0.73  --   --   --   ? ? ? ?Estimated Creatinine Clearance: 61.8 mL/min (by C-G formula based on SCr of 0.73 mg/dL). ? ?Assessment: ?71 year old female with a mechanical mitral valve who needs surgery for fracture of R-knee after fall. Pharmacy consulted 3/16 to reverse INR (chronic Coumadin) for surgery and transition to IV Heparin therapy. Vit K d/c'd at patient request per MD note. INR 2.9 on admit 3/16 and 3.0 on 3/17. Down to 1.5 this am, will begin IV heparin. OR currently scheduled for 3/20 am. ? ?Heparin level <0.1 on 900 units/hr. No bleeding noted. Spoke with RN and no problems with infusion or bleeding. ?  ?PTA Warfarin regimen:  7.5 mg Sun/Tues/Thurs + 5 mg Mon/Wed/Fri/Sat ?- last warfarin dose 3/14 pm ? ?Goal of Therapy:  ?Heparin level 0.3-0.7 units/ml ?Monitor platelets by anticoagulation protocol: Yes ?  ?Plan:  ?Heparin 2000 units IV x 1 ?Increase heparin drip to 1100 units/hr ?Heparin level ~8 hrs after rate  increase ?Daily heparin level and CBC. Has daily PT/INR. ?Warfarin on hold. ?Follow up for any change in surgical plans. ? ?Thank you for involving pharmacy in this patient's care. ? ?Renold Genta, PharmD, BCPS ?Clinical Pharmacist ?Clinical phone for 01/03/2022 until 10p is [phone] ?01/03/2022 9:20 PM ? ?**Pharmacist phone directory can be found on [email or web address] listed under [place]** ? ? ? ?

## 2022-01-03 NOTE — Progress Notes (Signed)
ANTICOAGULATION CONSULT NOTE - Follow Up Consult ? ?Pharmacy Consult for Heparin (warfarin on hold) ?Indication:  mechanical valve ? ?Allergies  ?Allergen Reactions  ? Erythromycin Nausea And Vomiting  ? Atorvastatin Other (See Comments)  ?  Pt reports causes body to be stiff, joints ache, aching in knees and ankles, feels more fatigued.   ? Lactose Intolerance (Gi) Other (See Comments)  ?  Upset stomach - yoghurt  ? Valsartan Other (See Comments)  ?  Pt reports caused her depression  ? ? ?Patient Measurements: ?Height: '5\' 7"'$  (170.2 cm) ?Weight: 66.6 kg (146 lb 13.2 oz) ?IBW/kg (Calculated) : 61.6 ?Heparin Dosing Weight: 66 kg ? ?Vital Signs: ?Temp: 98.6 ?F (37 ?C) (03/17 2253) ?Temp Source: Oral (03/17 2253) ?BP: 122/60 (03/17 2253) ?Pulse Rate: 89 (03/17 2253) ? ?Labs: ?Recent Labs  ?  01/01/22 ?1728 01/02/22 ?0246 01/03/22 ?0308  ?HGB 12.6 10.6* 10.8*  ?HCT 36.8 31.3* 31.4*  ?PLT 200 154 163  ?LABPROT 30.3* 31.1* 18.0*  ?INR 2.9* 3.0* 1.5*  ?CREATININE 0.73  --   --   ? ? ?Estimated Creatinine Clearance: 61.8 mL/min (by C-G formula based on SCr of 0.73 mg/dL). ? ?Assessment: ?71 year old female with a mechanical mitral valve who needs surgery for fracture of R-knee after fall. Pharmacy consulted 3/16 to reverse INR (chronic Coumadin) for surgery and transition to IV Heparin therapy. Vit K d/c'd at patient request per MD note. INR 2.9 on admit 3/16 and 3.0 on 3/17. Down to 1.5 this am, will begin IV heparin. OR currently scheduled for 3/20 am. ?  ?PTA Warfarin regimen:  7.5 mg Sun/Tues/Thurs + 5 mg Mon/Wed/Fri/Sat ?- last warfarin dose 3/14 pm ? ?Goal of Therapy:  ?Heparin level 0.3-0.7 units/ml ?Monitor platelets by anticoagulation protocol: Yes ?  ?Plan:  ?Heparin 3000 units IV x 1 ?Heparin drip to begin at 900 units/hr ?Heparin level ~8 hrs after drip begins. ?Daily heparin level and CBC. Has daily PT/INR. ?Warfarin on hold. ?Follow up for any change in surgical plans. ? ?Arty Baumgartner,  RPh ?01/03/2022,8:23 AM ? ? ?

## 2022-01-04 ENCOUNTER — Inpatient Hospital Stay (HOSPITAL_COMMUNITY): Payer: Medicare Other

## 2022-01-04 DIAGNOSIS — S82141A Displaced bicondylar fracture of right tibia, initial encounter for closed fracture: Secondary | ICD-10-CM | POA: Diagnosis not present

## 2022-01-04 DIAGNOSIS — I10 Essential (primary) hypertension: Secondary | ICD-10-CM | POA: Diagnosis not present

## 2022-01-04 LAB — BASIC METABOLIC PANEL
Anion gap: 9 (ref 5–15)
BUN: 25 mg/dL — ABNORMAL HIGH (ref 8–23)
CO2: 26 mmol/L (ref 22–32)
Calcium: 8.8 mg/dL — ABNORMAL LOW (ref 8.9–10.3)
Chloride: 101 mmol/L (ref 98–111)
Creatinine, Ser: 0.69 mg/dL (ref 0.44–1.00)
GFR, Estimated: >60 mL/min (ref >60)
Glucose, Bld: 128 mg/dL — ABNORMAL HIGH (ref 70–99)
Potassium: 3.9 mmol/L (ref 3.5–5.1)
Sodium: 136 mmol/L (ref 135–145)

## 2022-01-04 LAB — CBC
HCT: 32.7 % — ABNORMAL LOW (ref 36.0–46.0)
Hemoglobin: 11.5 g/dL — ABNORMAL LOW (ref 12.0–15.0)
MCH: 33 pg (ref 26.0–34.0)
MCHC: 35.2 g/dL (ref 30.0–36.0)
MCV: 93.7 fL (ref 80.0–100.0)
Platelets: 187 10*3/uL (ref 150–400)
RBC: 3.49 MIL/uL — ABNORMAL LOW (ref 3.87–5.11)
RDW: 13.4 % (ref 11.5–15.5)
WBC: 11.9 10*3/uL — ABNORMAL HIGH (ref 4.0–10.5)
nRBC: 0 % (ref 0.0–0.2)

## 2022-01-04 LAB — PROTIME-INR
INR: 1.2 (ref 0.8–1.2)
Prothrombin Time: 14.7 seconds (ref 11.4–15.2)

## 2022-01-04 LAB — HEPARIN LEVEL (UNFRACTIONATED): Heparin Unfractionated: 0.38 IU/mL (ref 0.30–0.70)

## 2022-01-04 MED ORDER — CEFAZOLIN SODIUM-DEXTROSE 2-4 GM/100ML-% IV SOLN
2.0000 g | INTRAVENOUS | Status: AC
  Administered 2022-01-05: 2 g via INTRAVENOUS
  Filled 2022-01-04: qty 100

## 2022-01-04 MED ORDER — DOCUSATE SODIUM 100 MG PO CAPS
100.0000 mg | ORAL_CAPSULE | Freq: Two times a day (BID) | ORAL | Status: DC
Start: 2022-01-04 — End: 2022-01-05
  Administered 2022-01-04: 100 mg via ORAL
  Filled 2022-01-04: qty 1

## 2022-01-04 NOTE — Progress Notes (Addendum)
ANTICOAGULATION CONSULT NOTE - Follow Up Consult ? ?Pharmacy Consult for Heparin (warfarin on hold) ?Indication:  mechanical mitral valve ? ?Allergies  ?Allergen Reactions  ? Erythromycin Nausea And Vomiting  ? Atorvastatin Other (See Comments)  ?  Pt reports causes body to be stiff, joints ache, aching in knees and ankles, feels more fatigued.   ? Lactose Intolerance (Gi) Other (See Comments)  ?  Upset stomach - yoghurt  ? Valsartan Other (See Comments)  ?  Pt reports caused her depression  ? ? ?Patient Measurements: ?Height: '5\' 7"'$  (170.2 cm) ?Weight: 66.6 kg (146 lb 13.2 oz) ?IBW/kg (Calculated) : 61.6 ?Heparin Dosing Weight: 66 kg ? ?Vital Signs: ?Temp: 98.1 ?F (36.7 ?C) (03/18 2147) ?Temp Source: Oral (03/18 2147) ?BP: 137/55 (03/18 2147) ?Pulse Rate: 82 (03/18 2147) ? ?Labs: ?Recent Labs  ?  01/01/22 ?1728 01/02/22 ?0246 01/03/22 ?0308 01/03/22 ?2057 01/04/22 ?0502  ?HGB 12.6 10.6* 10.8*  --  11.5*  ?HCT 36.8 31.3* 31.4*  --  32.7*  ?PLT 200 154 163  --  187  ?LABPROT 30.3* 31.1* 18.0*  --  14.7  ?INR 2.9* 3.0* 1.5*  --  1.2  ?HEPARINUNFRC  --   --   --  <0.10* 0.38  ?CREATININE 0.73  --   --   --  0.69  ? ? ?Estimated Creatinine Clearance: 61.8 mL/min (by C-G formula based on SCr of 0.69 mg/dL). ? ?Assessment: ?71 year old female with a mechanical mitral valve who needs surgery for fracture of R-knee after fall. Pharmacy consulted 3/16 to reverse INR (chronic Coumadin) for surgery and transition to IV Heparin therapy. Vit K d/c'd at patient request per MD note. INR 2.9 on admit 3/16 and 3.0 on 3/17. Down to 1.5 on 3/18 and IV heparin begun. OR currently scheduled for 3/20 am. ? ? Heparin level is now therapeutic (0.38) on 1100 units/hr.  INR down to 1.2. CBC stable. ?  ?PTA Warfarin regimen:  7.5 mg Sun/Tues/Thurs + 5 mg Mon/Wed/Fri/Sat ?- last warfarin dose 3/14 pm ? ?Goal of Therapy:  ?Heparin level 0.3-0.7 units/ml ?Monitor platelets by anticoagulation protocol: Yes ?  ?Plan:  ?Continue heparin drip at 1100  units/hr. ?Daily heparin level and CBC; has daily PT/INR. ?Warfarin on hold. ?Follow up for timing of when to hold IV heparin pre-op. ?- 12n addendum: to hold heparin at 4am on 3/20 for OR ?- will f/u post-op anticoagulation plans. ? ?Arty Baumgartner, RPh ?01/04/2022,7:37 AM ? ? ?

## 2022-01-04 NOTE — Progress Notes (Signed)
? ?                              Orthopaedic Trauma Service Progress Note ? ?Patient ID: ?[name] [name] ?MRN: [id number] ?DOB/AGE: [birth year] 71 y.o. ? ?Subjective: ? ?Doing well this am  ?Pain controlled with tylenol  ?Eager for surgery tomorrow.  Asked a lot of good questions  ? ?Lives alone with her cat ?Multilevel house ?Uses a walker at baseline but states her balance is good overall ? ?Complex medical history including stroke, CHF, s/p  MVR, MS, neurogenic bladder, chronic anticoagulation  ? ?History of osteoporosis. Gets reg dexa scans ?Takes supplements, no pharmacologics  ?History of vertebral compression fractures  ? ?ROS ?As above ? ?Objective:  ? ?VITALS:   ?Vitals:  ? 01/03/22 0842 01/03/22 1633 01/03/22 2147 01/04/22 0811  ?BP: 131/65 117/64 (!) 137/55 (!) 130/57  ?Pulse: 83 74 82 86  ?Resp: '18 18 18 18  '$ ?Temp: 97.7 ?F (36.5 ?C) 98 ?F (36.7 ?C) 98.1 ?F (36.7 ?C) 98.4 ?F (36.9 ?C)  ?TempSrc: Oral  Oral Oral  ?SpO2: 98% 98% 97% 98%  ?Weight:      ?Height:      ? ? ?Estimated body mass index is 23 kg/m? as calculated from the following: ?  Height as of this encounter: '5\' 7"'$  (1.702 m). ?  Weight as of this encounter: 66.6 kg. ? ? ?Intake/Output   ?   03/18 0701 ?03/19 0700 03/19 0701 ?03/20 0700  ? P.O. 240   ? I.V. (mL/kg) 107 (1.6)   ? Total Intake(mL/kg) 347 (5.2)   ? Net +347   ?     ? Urine Occurrence 5 x   ? Stool Occurrence 1 x   ?  ? ?LABS ? ?Results for orders placed or performed during the hospital encounter of 01/01/22 (from the past 24 hour(s))  ?Heparin level (unfractionated)     Status: Abnormal  ? Collection Time: 01/03/22  8:57 PM  ?Result Value Ref Range  ? Heparin Unfractionated <0.10 (L) 0.30 - 0.70 IU/mL  ?Protime-INR     Status: None  ? Collection Time: 01/04/22  5:02 AM  ?Result Value Ref Range  ? Prothrombin Time 14.7 11.4 - 15.2 seconds  ? INR 1.2 0.8 - 1.2  ?CBC     Status: Abnormal  ? Collection Time: 01/04/22  5:02 AM  ?Result Value Ref  Range  ? WBC 11.9 (H) 4.0 - 10.5 K/uL  ? RBC 3.49 (L) 3.87 - 5.11 MIL/uL  ? Hemoglobin 11.5 (L) 12.0 - 15.0 g/dL  ? HCT 32.7 (L) 36.0 - 46.0 %  ? MCV 93.7 80.0 - 100.0 fL  ? MCH 33.0 26.0 - 34.0 pg  ? MCHC 35.2 30.0 - 36.0 g/dL  ? RDW 13.4 11.5 - 15.5 %  ? Platelets 187 150 - 400 K/uL  ? nRBC 0.0 0.0 - 0.2 %  ?Basic metabolic panel     Status: Abnormal  ? Collection Time: 01/04/22  5:02 AM  ?Result Value Ref Range  ? Sodium 136 135 - 145 mmol/L  ? Potassium 3.9 3.5 - 5.1 mmol/L  ? Chloride 101 98 - 111 mmol/L  ? CO2 26 22 - 32 mmol/L  ? Glucose, Bld 128 (H) 70 - 99 mg/dL  ? BUN 25 (H) 8 - 23 mg/dL  ? Creatinine, Ser 0.69 0.44 - 1.00 mg/dL  ? Calcium 8.8 (L) 8.9 - 10.3 mg/dL  ? GFR,  Estimated >60 >60 mL/min  ? Anion gap 9 5 - 15  ?Heparin level (unfractionated)     Status: None  ? Collection Time: 01/04/22  5:02 AM  ?Result Value Ref Range  ? Heparin Unfractionated 0.38 0.30 - 0.70 IU/mL  ? ?*Note: Due to a large number of results and/or encounters for the requested time period, some results have not been displayed. A complete set of results can be found in Results Review.  ? ? ? ?PHYSICAL EXAM:  ? ?Gen: sitting up in bed, pleasant, NAD ?Lungs: unlabored ?Ext:  ?     Right Lower Extremity  ? Leg is splinted ? Splint fitting well ? Swelling controlled ? DPN, SPN, TN sensation intact ? EHL, FHL, lesser toe motor intact ? No pain out of proportion with passive stretching of toes  ? + DP pulse ?  ?     Left lower Extremity  ? TTP and ecchymosis to L lateral forefoot ? Knee and ankle unremarkable ? Motor and sensory function o/w intact  ? ? ? ?Assessment/Plan: ?   ? ?Principal Problem: ?  Tibial plateau fracture, right ?Active Problems: ?  S/P mitral valve replacement ?  Hypothyroidism ?  CHF (congestive heart failure) ([facility]) ?  Paroxysmal atrial fibrillation (HCC) ?  Long term current use of anticoagulant ?  Essential hypertension ?  Normocytic anemia ?  Malnutrition of moderate degree ? ? ?Anti-infectives (From  admission, onward)  ? ? None  ? ?  ?. ? ?71 y/o female with complex medical history s/p fall with R tibial plateau and shaft fracture (fragility fracture) ? ?- fall ? ?- closed R tibial plateau fracture, R proximal fibula fracture ? OR tomorrow for ORIF R tibia  ? NWB x 6 weeks ? Unrestricted ROM post op  ? ? PT/OT evals post op ? Will likely need SNF or possibly CIR  ? ?- L foot ecchymosis  ? xrays ? ?- Pain management: ? Multimodal  ? ?- ABL anemia/Hemodynamics ? Stable ? ?- Medical issues  ? Per primary  ? ?- DVT/PE prophylaxis: ? On heparin bridge  ? Hold heparin at 0400 on 3/20, resume 6 hours post op ?- ID:  ? Periop abx ? ?- Metabolic Bone Disease: ? Known osteoporosis ? Has provider for osteoporosis  ? May benefit from pharmacologics at this point ? ?- Activity: ? Ok to get out of bed with assistance  ? ?- FEN/GI prophylaxis/Foley/Lines: ? NPO after MN  ? ?- Impediments to fracture healing: ? Osteoporosis  ? Balance issues/walker dependent ? ?- Dispo: ? OR tomorrow for R tibia  ? ? ? ?Jari Pigg, PA-C ?[phone] (C) ?01/04/2022, 10:02 AM ? ?[facility] ?[place] [address] ?[phone] Jenetta Downer) ?[phone] (F) ? ? ? ?After 5pm and on the weekends please log on to Amion, go to orthopaedics and the look under the [facility] Call for the provider(s) on call. You can also call our office at [phone] and then follow the prompts to be connected to the call team.  ? Patient ID: [name] [name], female   DOB: 25-Oct-1950, 71 y.o.   MRN: [id number] ? ?

## 2022-01-04 NOTE — Anesthesia Preprocedure Evaluation (Addendum)
Anesthesia Evaluation  ?Patient identified by MRN, date of birth, ID band ?Patient awake ? ? ? ?Reviewed: ?Allergy & Precautions, NPO status , Patient's Chart, lab work & pertinent test results, reviewed documented beta blocker date and time  ? ?Airway ?Mallampati: II ? ?TM Distance: >3 FB ?Neck ROM: Full ? ? ? Dental ? ?(+) Teeth Intact, Dental Advisory Given,  ?  ?Pulmonary ?COPD,  ?  ?Pulmonary exam normal ?breath sounds clear to auscultation ? ? ? ? ? ? Cardiovascular ?hypertension, Pt. on home beta blockers and Pt. on medications ?(-) angina+CHF  ?(-) Past MI + dysrhythmias Atrial Fibrillation + Valvular Problems/Murmurs (s/p MVR 2013)  ?Rhythm:Regular Rate:Normal ?+ Diastolic murmurs ? ?  ?Neuro/Psych ?PSYCHIATRIC DISORDERS Anxiety Multiple sclerosis ? ? Neuromuscular disease CVA, No Residual Symptoms   ? GI/Hepatic ?negative GI ROS, (+)  ?  ? substance abuse (remote use) ? alcohol use,   ?Endo/Other  ?Hypothyroidism  ? Renal/GU ?negative Renal ROS  ? ?  ?Musculoskeletal ? ?(+) Arthritis , closed fracture right tibial plateau  ? Abdominal ?  ?Peds ? Hematology ? ?(+) Blood dyscrasia (Warfarin), anemia ,   ?Anesthesia Other Findings ? ? Reproductive/Obstetrics ? ?  ? ? ? ? ? ? ? ? ? ? ? ? ? ?  ?  ? ? ? ? ? ? ?Anesthesia Physical ?Anesthesia Plan ? ?ASA: 3 ? ?Anesthesia Plan: General  ? ?Post-op Pain Management: Tylenol PO (pre-op)*  ? ?Induction: Intravenous ? ?PONV Risk Score and Plan: 3 and Dexamethasone, Ondansetron and Treatment may vary due to age or medical condition ? ?Airway Management Planned: Oral ETT ? ?Additional Equipment:  ? ?Intra-op Plan:  ? ?Post-operative Plan: Extubation in OR ? ?Informed Consent: I have reviewed the patients History and Physical, chart, labs and discussed the procedure including the risks, benefits and alternatives for the proposed anesthesia with the patient or authorized representative who has indicated his/her understanding and  acceptance.  ? ? ? ?Dental advisory given ? ?Plan Discussed with: CRNA ? ?Anesthesia Plan Comments:   ? ? ? ? ? ?Anesthesia Quick Evaluation ? ?

## 2022-01-04 NOTE — Progress Notes (Signed)
?PROGRESS NOTE ? ?[name] [name]  GUR:[id number] DOB: April 02, 1951 DOA: 01/01/2022 ?PCP: Howard Pouch A, DO  ? ?Brief Narrative: ?Patient is a 71 year old female with history of paroxysmal A-fib status post Maze procedure, mitral valve replacement with mechanical valve, on Coumadin, history of CVA, chronic diastolic CHF, hypertension who presented from home after a fall followed by right knee pain.  Imaging showed right tibial plateau fracture.  Orthopedics consulted, planning for ORIF after optimization of INR ? ?Assessment and Plan: ? ?* Tibial plateau fracture, right ?Tibial plateau fracture, proximal tibial fracture, fibular fracture, lateral malleolus fracture.   ?-Ortho following, planning for ORIF tomorrow ?-Coumadin on hold, INR down to 1.2 today ?-Hold heparin early a.m. ? ?Mechanical MVR ?Long term current use of anticoagulant ?Coumadin use in setting of PAF, mechanical MVR, prior CVA.   ?-She is at high risk for further CVA/thrombosis in the mitral valve .  ?-Coumadin on hold.,  INR down to 1.2, continue IV heparin ?-Restart Coumadin in 1 to 2 days ? ?Chronic diastolic CHF  ?Normal LVEF and functioning MV prosthesis as of 2d echo in 2021. ?-Euvolemic, monitor ? ?Essential hypertension ?-Continue Toprol and lisinopril ? ?Hypothyroidism ?Cont thyroid medication ? ?Normocytic anemia ?Mild, stable ? ?Paroxysmal atrial fibrillation (HCC) ?Currently in normal sinus rhythm.  Rate is controlled with metoprolol.  Anticoagulation on hold ? ?S/P mitral valve replacement ?Mechanical mitral valve.  On Coumadin currently on hold, see discussion above ? ?Nutrition Problem: Moderate Malnutrition ?Etiology: chronic illness (CHF, COPD) ?  ? ?DVT prophylaxis: Heparin GTT ? ? ?  Code Status: Full Code ?Family Communication: Discussed patient detail, no family at bedside ?Anticipated discharge to: Skilled nursing facility versus home health ? ? ?Consultants: Orthopedics ? ?Procedures: None yet ? ?Antimicrobials:   ?Anti-infectives (From admission, onward)  ? ? Start     Dose/Rate Route Frequency Ordered Stop  ? 01/05/22 0730  ceFAZolin (ANCEF) IVPB 2g/100 mL premix       ? 2 g ?200 mL/hr over 30 Minutes Intravenous On call to O.R. 01/04/22 1201 01/06/22 0559  ? ?  ? ? ?Subjective: ? ?-Feels okay, denies any complaints, no dyspnea, using Tylenol for pain control ? ? ?Objective: ?Vitals:  ? 01/03/22 0842 01/03/22 1633 01/03/22 2147 01/04/22 0811  ?BP: 131/65 117/64 (!) 137/55 (!) 130/57  ?Pulse: 83 74 82 86  ?Resp: '18 18 18 18  '$ ?Temp: 97.7 ?F (36.5 ?C) 98 ?F (36.7 ?C) 98.1 ?F (36.7 ?C) 98.4 ?F (36.9 ?C)  ?TempSrc: Oral  Oral Oral  ?SpO2: 98% 98% 97% 98%  ?Weight:      ?Height:      ? ? ?Intake/Output Summary (Last 24 hours) at 01/04/2022 1324 ?Last data filed at 01/03/2022 2154 ?Gross per 24 hour  ?Intake 226.98 ml  ?Output --  ?Net 226.98 ml  ? ?Filed Weights  ? 01/01/22 1458 01/01/22 2000  ?Weight: 59.9 kg 66.6 kg  ? ? ?Examination: ? ?General exam: Pleasant female laying in bed, AAOx3, no distress ?HEENT: No JVD ?CVS: S1-S2, regular rate rhythm ?Lungs: Clear bilaterally ?Abdomen: Soft, nontender, bowel sounds present  ?Extremities: Right lower leg swollen, with dressing, splint ?Skin: No rashes, no ulcers,no icterus   ? ? ?Data Reviewed: I have personally reviewed following labs and imaging studies ? ?CBC: ?Recent Labs  ?Lab 01/01/22 ?1728 01/02/22 ?0246 01/03/22 ?0308 01/04/22 ?0502  ?WBC 10.2 7.9 7.7 11.9*  ?NEUTROABS 8.1*  --   --   --   ?HGB 12.6 10.6* 10.8* 11.5*  ?HCT 36.8 31.3*  31.4* 32.7*  ?MCV 96.8 95.7 95.2 93.7  ?PLT 200 154 163 187  ? ?Basic Metabolic Panel: ?Recent Labs  ?Lab 01/01/22 ?1728 01/04/22 ?0502  ?NA 140 136  ?K 3.5 3.9  ?CL 102 101  ?CO2 29 26  ?GLUCOSE 125* 128*  ?BUN 22 25*  ?CREATININE 0.73 0.69  ?CALCIUM 9.0 8.8*  ? ? ? ?Recent Results (from the past 240 hour(s))  ?Resp Panel by RT-PCR (Flu A&B, Covid) Nasopharyngeal Swab     Status: None  ? Collection Time: 01/01/22  5:29 PM  ? Specimen:  Nasopharyngeal Swab; Nasopharyngeal(NP) swabs in vial transport medium  ?Result Value Ref Range Status  ? SARS Coronavirus 2 by RT PCR NEGATIVE NEGATIVE Final  ?  Comment: (NOTE) ?SARS-CoV-2 target nucleic acids are NOT DETECTED. ? ?The SARS-CoV-2 RNA is generally detectable in upper respiratory ?specimens during the acute phase of infection. The lowest ?concentration of SARS-CoV-2 viral copies this assay can detect is ?138 copies/mL. A negative result does not preclude SARS-Cov-2 ?infection and should not be used as the sole basis for treatment or ?other patient management decisions. A negative result may occur with  ?improper specimen collection/handling, submission of specimen other ?than nasopharyngeal swab, presence of viral mutation(s) within the ?areas targeted by this assay, and inadequate number of viral ?copies(<138 copies/mL). A negative result must be combined with ?clinical observations, patient history, and epidemiological ?information. The expected result is Negative. ? ?Fact Sheet for Patients:  ?EntrepreneurPulse.com.au ? ?Fact Sheet for Healthcare Providers:  ?IncredibleEmployment.be ? ?This test is no t yet approved or cleared by the [place] FDA and  ?has been authorized for detection and/or diagnosis of SARS-CoV-2 by ?FDA under an Emergency Use Authorization (EUA). This EUA will remain  ?in effect (meaning this test can be used) for the duration of the ?COVID-19 declaration under Section 564(b)(1) of the Act, 21 ?U.S.C.section 360bbb-3(b)(1), unless the authorization is terminated  ?or revoked sooner.  ? ? ?  ? Influenza A by PCR NEGATIVE NEGATIVE Final  ? Influenza B by PCR NEGATIVE NEGATIVE Final  ?  Comment: (NOTE) ?The Xpert Xpress SARS-CoV-2/FLU/RSV plus assay is intended as an aid ?in the diagnosis of influenza from Nasopharyngeal swab specimens and ?should not be used as a sole basis for treatment. Nasal washings and ?aspirates are unacceptable for  Xpert Xpress SARS-CoV-2/FLU/RSV ?testing. ? ?Fact Sheet for Patients: ?EntrepreneurPulse.com.au ? ?Fact Sheet for Healthcare Providers: ?IncredibleEmployment.be ? ?This test is not yet approved or cleared by the [place] FDA and ?has been authorized for detection and/or diagnosis of SARS-CoV-2 by ?FDA under an Emergency Use Authorization (EUA). This EUA will remain ?in effect (meaning this test can be used) for the duration of the ?COVID-19 declaration under Section 564(b)(1) of the Act, 21 U.S.C. ?section 360bbb-3(b)(1), unless the authorization is terminated or ?revoked. ? ?Performed at [facility], [address] [place], [place] [address] ?  ?Surgical pcr screen     Status: Abnormal  ? Collection Time: 01/02/22  1:10 PM  ? Specimen: Nasal Mucosa; Nasal Swab  ?Result Value Ref Range Status  ? MRSA, PCR (A) NEGATIVE Final  ?  INVALID, UNABLE TO DETERMINE THE PRESENCE OF TARGET DUE TO SPECIMEN INTEGRITY. RECOLLECTION REQUESTED.  ? Staphylococcus aureus (A) NEGATIVE Final  ?  INVALID, UNABLE TO DETERMINE THE PRESENCE OF TARGET DUE TO SPECIMEN INTEGRITY. RECOLLECTION REQUESTED.  ?  Comment:  SPOKE TO RN FOR RECOLLECT [phone] '@[phone]'$  FH ?INVALID, UNABLE TO DETERMINE THE PRESENCE OF TARGET DUE TO  SPECIMEN INTEGRITY. RECOLLECTION REQUESTED. ?Performed at [facility], [place] [address], [place], [place] [address] ?  ?Surgical pcr screen     Status: None  ? Collection Time: 01/02/22  4:50 PM  ? Specimen: Nasal Mucosa; Nasal Swab  ?Result Value Ref Range Status  ? MRSA, PCR NEGATIVE NEGATIVE Final  ? Staphylococcus aureus NEGATIVE NEGATIVE Final  ?  Comment: (NOTE) ?The Xpert SA Assay (FDA approved for NASAL specimens in patients 26 ?years of age and older), is one component of a comprehensive ?surveillance program. It is not intended to diagnose infection nor to ?guide or monitor treatment. ?Performed at [facility], [place] [address] [place],  [place] ?[address] ?  ?  ? ?Radiology Studies: ?DG Foot Complete Left ? ?Result Date: 01/04/2022 ?CLINICAL DATA:  Bruising. EXAM: LEFT FOOT - COMPLETE 3+ VIEW COMPARISON:  Left foot x-rays dated September 23, 2006. FINDINGS: Acute minimally displ

## 2022-01-05 ENCOUNTER — Encounter: Payer: Medicare Other | Admitting: Family Medicine

## 2022-01-05 ENCOUNTER — Encounter (HOSPITAL_COMMUNITY)
Admission: EM | Disposition: A | Discharge status: Skilled Nursing Facility | Payer: Self-pay | Source: Home / Self Care | Attending: Internal Medicine

## 2022-01-05 ENCOUNTER — Other Ambulatory Visit: Payer: Self-pay

## 2022-01-05 ENCOUNTER — Encounter (HOSPITAL_COMMUNITY): Payer: Self-pay | Admitting: Internal Medicine

## 2022-01-05 ENCOUNTER — Inpatient Hospital Stay (HOSPITAL_COMMUNITY): Payer: Medicare Other | Admitting: Anesthesiology

## 2022-01-05 ENCOUNTER — Inpatient Hospital Stay (HOSPITAL_COMMUNITY): Payer: Medicare Other

## 2022-01-05 DIAGNOSIS — I110 Hypertensive heart disease with heart failure: Secondary | ICD-10-CM

## 2022-01-05 DIAGNOSIS — S82141A Displaced bicondylar fracture of right tibia, initial encounter for closed fracture: Secondary | ICD-10-CM | POA: Diagnosis not present

## 2022-01-05 DIAGNOSIS — I10 Essential (primary) hypertension: Secondary | ICD-10-CM | POA: Diagnosis not present

## 2022-01-05 DIAGNOSIS — E039 Hypothyroidism, unspecified: Secondary | ICD-10-CM

## 2022-01-05 DIAGNOSIS — I5032 Chronic diastolic (congestive) heart failure: Secondary | ICD-10-CM

## 2022-01-05 HISTORY — PX: ORIF TIBIA PLATEAU: SHX2132

## 2022-01-05 LAB — BASIC METABOLIC PANEL
Anion gap: 8 (ref 5–15)
BUN: 23 mg/dL (ref 8–23)
CO2: 25 mmol/L (ref 22–32)
Calcium: 8.7 mg/dL — ABNORMAL LOW (ref 8.9–10.3)
Chloride: 100 mmol/L (ref 98–111)
Creatinine, Ser: 0.71 mg/dL (ref 0.44–1.00)
GFR, Estimated: >60 mL/min (ref >60)
Glucose, Bld: 138 mg/dL — ABNORMAL HIGH (ref 70–99)
Potassium: 3.9 mmol/L (ref 3.5–5.1)
Sodium: 133 mmol/L — ABNORMAL LOW (ref 135–145)

## 2022-01-05 LAB — CBC
HCT: 30.6 % — ABNORMAL LOW (ref 36.0–46.0)
Hemoglobin: 10.8 g/dL — ABNORMAL LOW (ref 12.0–15.0)
MCH: 33.1 pg (ref 26.0–34.0)
MCHC: 35.3 g/dL (ref 30.0–36.0)
MCV: 93.9 fL (ref 80.0–100.0)
Platelets: 220 10*3/uL (ref 150–400)
RBC: 3.26 MIL/uL — ABNORMAL LOW (ref 3.87–5.11)
RDW: 13.4 % (ref 11.5–15.5)
WBC: 11.2 10*3/uL — ABNORMAL HIGH (ref 4.0–10.5)
nRBC: 0 % (ref 0.0–0.2)

## 2022-01-05 LAB — VITAMIN D 25 HYDROXY (VIT D DEFICIENCY, FRACTURES): Vit D, 25-Hydroxy: 68.1 ng/mL (ref 30–100)

## 2022-01-05 LAB — PROTIME-INR
INR: 1.1 (ref 0.8–1.2)
Prothrombin Time: 14.1 seconds (ref 11.4–15.2)

## 2022-01-05 LAB — HEPARIN LEVEL (UNFRACTIONATED): Heparin Unfractionated: 0.33 IU/mL (ref 0.30–0.70)

## 2022-01-05 SURGERY — OPEN REDUCTION INTERNAL FIXATION (ORIF) TIBIAL PLATEAU
Anesthesia: General | Laterality: Right

## 2022-01-05 MED ORDER — PROPOFOL 10 MG/ML IV BOLUS
INTRAVENOUS | Status: DC | PRN
  Administered 2022-01-05: 100 mg via INTRAVENOUS

## 2022-01-05 MED ORDER — HEPARIN (PORCINE) 25000 UT/250ML-% IV SOLN
1250.0000 [IU]/h | INTRAVENOUS | Status: DC
  Administered 2022-01-05 (×2): 1100 [IU]/h via INTRAVENOUS
  Administered 2022-01-06 – 2022-01-11 (×6): 1250 [IU]/h via INTRAVENOUS
  Filled 2022-01-05 (×7): qty 250

## 2022-01-05 MED ORDER — TRANEXAMIC ACID-NACL 1000-0.7 MG/100ML-% IV SOLN
1000.0000 mg | INTRAVENOUS | Status: AC
  Administered 2022-01-05: 1000 mg via INTRAVENOUS

## 2022-01-05 MED ORDER — PHENYLEPHRINE 40 MCG/ML (10ML) SYRINGE FOR IV PUSH (FOR BLOOD PRESSURE SUPPORT)
PREFILLED_SYRINGE | INTRAVENOUS | Status: DC | PRN
  Administered 2022-01-05: 120 ug via INTRAVENOUS
  Administered 2022-01-05: 160 ug via INTRAVENOUS

## 2022-01-05 MED ORDER — METOCLOPRAMIDE HCL 5 MG PO TABS: 5.0000 mg | ORAL_TABLET | Freq: Three times a day (TID) | ORAL | Status: DC | PRN

## 2022-01-05 MED ORDER — ONDANSETRON HCL 4 MG/2ML IJ SOLN
4.0000 mg | Freq: Once | INTRAMUSCULAR | Status: AC
  Administered 2022-01-05: 4 mg via INTRAVENOUS

## 2022-01-05 MED ORDER — WARFARIN SODIUM 7.5 MG PO TABS
7.5000 mg | ORAL_TABLET | Freq: Once | ORAL | Status: AC
  Administered 2022-01-05: 7.5 mg via ORAL
  Filled 2022-01-05: qty 1

## 2022-01-05 MED ORDER — CHLORHEXIDINE GLUCONATE 0.12 % MT SOLN: 15.0000 mL | Freq: Once | OROMUCOSAL | Status: AC

## 2022-01-05 MED ORDER — LIDOCAINE 2% (20 MG/ML) 5 ML SYRINGE
INTRAMUSCULAR | Status: AC
  Filled 2022-01-05: qty 5

## 2022-01-05 MED ORDER — ONDANSETRON HCL 4 MG/2ML IJ SOLN: 4.0000 mg | Freq: Four times a day (QID) | INTRAMUSCULAR | Status: DC | PRN

## 2022-01-05 MED ORDER — ROCURONIUM BROMIDE 10 MG/ML (PF) SYRINGE
PREFILLED_SYRINGE | INTRAVENOUS | Status: DC | PRN
  Administered 2022-01-05 (×2): 50 mg via INTRAVENOUS

## 2022-01-05 MED ORDER — DOCUSATE SODIUM 100 MG PO CAPS
100.0000 mg | ORAL_CAPSULE | Freq: Two times a day (BID) | ORAL | Status: DC
  Administered 2022-01-05 – 2022-01-06 (×2): 100 mg via ORAL
  Filled 2022-01-05 (×2): qty 1

## 2022-01-05 MED ORDER — ONDANSETRON HCL 4 MG/2ML IJ SOLN
INTRAMUSCULAR | Status: DC | PRN
  Administered 2022-01-05: 4 mg via INTRAVENOUS

## 2022-01-05 MED ORDER — ACETAMINOPHEN 500 MG PO TABS
1000.0000 mg | ORAL_TABLET | Freq: Once | ORAL | Status: AC
  Administered 2022-01-05: 1000 mg via ORAL
  Filled 2022-01-05: qty 2

## 2022-01-05 MED ORDER — FENTANYL CITRATE (PF) 100 MCG/2ML IJ SOLN: 25.0000 ug | INTRAMUSCULAR | Status: DC | PRN

## 2022-01-05 MED ORDER — 0.9 % SODIUM CHLORIDE (POUR BTL) OPTIME
TOPICAL | Status: DC | PRN
  Administered 2022-01-05: 1000 mL

## 2022-01-05 MED ORDER — PHENYLEPHRINE HCL-NACL 20-0.9 MG/250ML-% IV SOLN
INTRAVENOUS | Status: DC | PRN
  Administered 2022-01-05: 50 ug/min via INTRAVENOUS

## 2022-01-05 MED ORDER — FENTANYL CITRATE (PF) 250 MCG/5ML IJ SOLN
INTRAMUSCULAR | Status: DC | PRN
  Administered 2022-01-05 (×3): 50 ug via INTRAVENOUS

## 2022-01-05 MED ORDER — EPHEDRINE 5 MG/ML INJ
INTRAVENOUS | Status: AC
  Filled 2022-01-05: qty 5

## 2022-01-05 MED ORDER — WARFARIN - PHARMACIST DOSING INPATIENT: Freq: Every day | Status: DC

## 2022-01-05 MED ORDER — METOCLOPRAMIDE HCL 5 MG/ML IJ SOLN: 5.0000 mg | Freq: Three times a day (TID) | INTRAMUSCULAR | Status: DC | PRN

## 2022-01-05 MED ORDER — EPHEDRINE SULFATE-NACL 50-0.9 MG/10ML-% IV SOSY
PREFILLED_SYRINGE | INTRAVENOUS | Status: DC | PRN
  Administered 2022-01-05: 5 mg via INTRAVENOUS
  Administered 2022-01-05 (×2): 10 mg via INTRAVENOUS

## 2022-01-05 MED ORDER — CEFAZOLIN SODIUM-DEXTROSE 2-4 GM/100ML-% IV SOLN: 2.0000 g | INTRAVENOUS | Status: DC

## 2022-01-05 MED ORDER — CHLORHEXIDINE GLUCONATE 0.12 % MT SOLN
OROMUCOSAL | Status: AC
  Administered 2022-01-05: 15 mL via OROMUCOSAL
  Filled 2022-01-05: qty 15

## 2022-01-05 MED ORDER — MIDAZOLAM HCL 2 MG/2ML IJ SOLN
INTRAMUSCULAR | Status: DC | PRN
  Administered 2022-01-05 (×2): 1 mg via INTRAVENOUS

## 2022-01-05 MED ORDER — TRANEXAMIC ACID-NACL 1000-0.7 MG/100ML-% IV SOLN
INTRAVENOUS | Status: AC
  Filled 2022-01-05: qty 100

## 2022-01-05 MED ORDER — ONDANSETRON HCL 4 MG/2ML IJ SOLN
INTRAMUSCULAR | Status: AC
  Filled 2022-01-05: qty 2

## 2022-01-05 MED ORDER — CEFAZOLIN SODIUM-DEXTROSE 1-4 GM/50ML-% IV SOLN
1.0000 g | Freq: Four times a day (QID) | INTRAVENOUS | Status: AC
  Administered 2022-01-05 (×3): 1 g via INTRAVENOUS
  Filled 2022-01-05 (×3): qty 50

## 2022-01-05 MED ORDER — LACTATED RINGERS IV SOLN: INTRAVENOUS | Status: DC

## 2022-01-05 MED ORDER — POVIDONE-IODINE 10 % EX SWAB: 2.0000 "application " | Freq: Once | CUTANEOUS | Status: DC

## 2022-01-05 MED ORDER — PROPOFOL 10 MG/ML IV BOLUS
INTRAVENOUS | Status: AC
  Filled 2022-01-05: qty 20

## 2022-01-05 MED ORDER — ORAL CARE MOUTH RINSE: 15.0000 mL | Freq: Once | OROMUCOSAL | Status: AC

## 2022-01-05 MED ORDER — SUGAMMADEX SODIUM 200 MG/2ML IV SOLN
INTRAVENOUS | Status: DC | PRN
  Administered 2022-01-05: 300 mg via INTRAVENOUS

## 2022-01-05 MED ORDER — LIDOCAINE 2% (20 MG/ML) 5 ML SYRINGE
INTRAMUSCULAR | Status: DC | PRN
  Administered 2022-01-05: 60 mg via INTRAVENOUS

## 2022-01-05 MED ORDER — FENTANYL CITRATE (PF) 250 MCG/5ML IJ SOLN
INTRAMUSCULAR | Status: AC
  Filled 2022-01-05: qty 5

## 2022-01-05 MED ORDER — DEXAMETHASONE SODIUM PHOSPHATE 10 MG/ML IJ SOLN
INTRAMUSCULAR | Status: DC | PRN
  Administered 2022-01-05: 10 mg via INTRAVENOUS

## 2022-01-05 MED ORDER — METOPROLOL TARTRATE 12.5 MG HALF TABLET
ORAL_TABLET | ORAL | Status: AC
  Administered 2022-01-05: 25 mg via ORAL
  Filled 2022-01-05: qty 2

## 2022-01-05 MED ORDER — ROCURONIUM BROMIDE 10 MG/ML (PF) SYRINGE
PREFILLED_SYRINGE | INTRAVENOUS | Status: AC
Start: 2022-01-05 — End: ?
  Filled 2022-01-05: qty 10

## 2022-01-05 MED ORDER — CHLORHEXIDINE GLUCONATE 4 % EX LIQD
60.0000 mL | Freq: Once | CUTANEOUS | Status: AC
  Administered 2022-01-05: 4 via TOPICAL
  Filled 2022-01-05: qty 60

## 2022-01-05 MED ORDER — DEXAMETHASONE SODIUM PHOSPHATE 10 MG/ML IJ SOLN
INTRAMUSCULAR | Status: AC
  Filled 2022-01-05: qty 1

## 2022-01-05 MED ORDER — MIDAZOLAM HCL 2 MG/2ML IJ SOLN
INTRAMUSCULAR | Status: AC
  Filled 2022-01-05: qty 2

## 2022-01-05 SURGICAL SUPPLY — 82 items
BAG COUNTER SPONGE SURGICOUNT (BAG) ×2 IMPLANT
BANDAGE ESMARK 6X9 LF (GAUZE/BANDAGES/DRESSINGS) ×1 IMPLANT
BIT DRILL 3.3 LONG (BIT) ×1 IMPLANT
BIT DRILL QC 3.3X195 (BIT) ×1 IMPLANT
BLADE CLIPPER SURG (BLADE) IMPLANT
BLADE SURG 10 STRL SS (BLADE) ×2 IMPLANT
BLADE SURG 15 STRL LF DISP TIS (BLADE) ×1 IMPLANT
BLADE SURG 15 STRL SS (BLADE) ×2
BNDG COHESIVE 4X5 TAN STRL (GAUZE/BANDAGES/DRESSINGS) ×2 IMPLANT
BNDG ELASTIC 4X5.8 VLCR STR LF (GAUZE/BANDAGES/DRESSINGS) ×2 IMPLANT
BNDG ELASTIC 6X5.8 VLCR STR LF (GAUZE/BANDAGES/DRESSINGS) ×2 IMPLANT
BNDG ESMARK 6X9 LF (GAUZE/BANDAGES/DRESSINGS) ×2
BNDG GAUZE ELAST 4 BULKY (GAUZE/BANDAGES/DRESSINGS) ×2 IMPLANT
BRUSH SCRUB EZ PLAIN DRY (MISCELLANEOUS) ×4 IMPLANT
CANISTER SUCT 3000ML PPV (MISCELLANEOUS) ×2 IMPLANT
CAP LOCK NCB (Cap) ×4 IMPLANT
COVER SURGICAL LIGHT HANDLE (MISCELLANEOUS) ×2 IMPLANT
CUFF TOURN SGL QUICK 34 (TOURNIQUET CUFF) ×2
CUFF TRNQT CYL 34X4.125X (TOURNIQUET CUFF) ×1 IMPLANT
DRAPE C-ARM 42X72 X-RAY (DRAPES) ×3 IMPLANT
DRAPE C-ARMOR (DRAPES) ×2 IMPLANT
DRAPE HALF SHEET 40X57 (DRAPES) IMPLANT
DRAPE INCISE IOBAN 66X45 STRL (DRAPES) ×2 IMPLANT
DRAPE U-SHAPE 47X51 STRL (DRAPES) ×2 IMPLANT
DRSG ADAPTIC 3X8 NADH LF (GAUZE/BANDAGES/DRESSINGS) ×2 IMPLANT
DRSG MEPITEL 4X7.2 (GAUZE/BANDAGES/DRESSINGS) ×1 IMPLANT
DRSG PAD ABDOMINAL 8X10 ST (GAUZE/BANDAGES/DRESSINGS) ×4 IMPLANT
ELECT REM PT RETURN 9FT ADLT (ELECTROSURGICAL) ×2
ELECTRODE REM PT RTRN 9FT ADLT (ELECTROSURGICAL) ×1 IMPLANT
GAUZE SPONGE 4X4 12PLY STRL (GAUZE/BANDAGES/DRESSINGS) ×2 IMPLANT
GLOVE SRG 8 PF TXTR STRL LF DI (GLOVE) ×1 IMPLANT
GLOVE SURG ENC MOIS LTX SZ8 (GLOVE) ×2 IMPLANT
GLOVE SURG ORTHO LTX SZ7.5 (GLOVE) ×4 IMPLANT
GLOVE SURG UNDER POLY LF SZ7.5 (GLOVE) ×2 IMPLANT
GLOVE SURG UNDER POLY LF SZ8 (GLOVE) ×2
GOWN STRL REUS W/ TWL LRG LVL3 (GOWN DISPOSABLE) ×2 IMPLANT
GOWN STRL REUS W/ TWL XL LVL3 (GOWN DISPOSABLE) ×1 IMPLANT
GOWN STRL REUS W/TWL LRG LVL3 (GOWN DISPOSABLE) ×4
GOWN STRL REUS W/TWL XL LVL3 (GOWN DISPOSABLE) ×2
IMMOBILIZER KNEE 22 UNIV (SOFTGOODS) ×2 IMPLANT
K-WIRE 2.0 (WIRE) ×4
K-WIRE FIX 1.4X150 (WIRE) ×2
K-WIRE FXSTD 280X2XNS SS (WIRE) ×2
KIT BASIN OR (CUSTOM PROCEDURE TRAY) ×2 IMPLANT
KIT TURNOVER KIT B (KITS) ×2 IMPLANT
KWIRE FIX 1.4X150 (WIRE) IMPLANT
KWIRE FXSTD 280X2XNS SS (WIRE) IMPLANT
NDL 18GX1X1/2 (RX/OR ONLY) (NEEDLE) IMPLANT
NDL SUT 6 .5 CRC .975X.05 MAYO (NEEDLE) IMPLANT
NEEDLE 18GX1X1/2 (RX/OR ONLY) (NEEDLE) ×2 IMPLANT
NEEDLE MAYO TAPER (NEEDLE)
NS IRRIG 1000ML POUR BTL (IV SOLUTION) ×2 IMPLANT
PACK ORTHO EXTREMITY (CUSTOM PROCEDURE TRAY) ×2 IMPLANT
PAD ABD 8X10 STRL (GAUZE/BANDAGES/DRESSINGS) ×1 IMPLANT
PAD ARMBOARD 7.5X6 YLW CONV (MISCELLANEOUS) ×4 IMPLANT
PAD CAST 4YDX4 CTTN HI CHSV (CAST SUPPLIES) ×1 IMPLANT
PADDING CAST COTTON 4X4 STRL (CAST SUPPLIES) ×2
PADDING CAST COTTON 6X4 STRL (CAST SUPPLIES) ×2 IMPLANT
PLATE NCB LAT PROX 3H TIBIA 9H (Plate) ×1 IMPLANT
SCREW CANN HD LONG 4.5X60 (Screw) ×1 IMPLANT
SCREW NCB 4.0 28MM (Screw) ×1 IMPLANT
SCREW NCB 4.0MX30M (Screw) ×2 IMPLANT
SCREW PROX ST NCB 4X80 (Screw) ×4 IMPLANT
SPONGE T-LAP 18X18 ~~LOC~~+RFID (SPONGE) ×2 IMPLANT
STAPLER VISISTAT 35W (STAPLE) ×2 IMPLANT
STOCKINETTE IMPERVIOUS LG (DRAPES) ×2 IMPLANT
SUCTION FRAZIER HANDLE 10FR (MISCELLANEOUS) ×2
SUCTION TUBE FRAZIER 10FR DISP (MISCELLANEOUS) ×1 IMPLANT
SUT ETHILON 2 0 FS 18 (SUTURE) ×2 IMPLANT
SUT PROLENE 0 CT 2 (SUTURE) ×3 IMPLANT
SUT VIC AB 0 CT1 27 (SUTURE)
SUT VIC AB 0 CT1 27XBRD ANBCTR (SUTURE) ×1 IMPLANT
SUT VIC AB 1 CT1 27 (SUTURE) ×2
SUT VIC AB 1 CT1 27XBRD ANBCTR (SUTURE) ×1 IMPLANT
SUT VIC AB 2-0 CT1 27 (SUTURE) ×4
SUT VIC AB 2-0 CT1 TAPERPNT 27 (SUTURE) ×2 IMPLANT
TOWEL GREEN STERILE (TOWEL DISPOSABLE) ×4 IMPLANT
TOWEL GREEN STERILE FF (TOWEL DISPOSABLE) ×2 IMPLANT
TRAY FOLEY MTR SLVR 16FR STAT (SET/KITS/TRAYS/PACK) IMPLANT
TUBE CONNECTING 12X1/4 (SUCTIONS) ×2 IMPLANT
WATER STERILE IRR 1000ML POUR (IV SOLUTION) ×4 IMPLANT
YANKAUER SUCT BULB TIP NO VENT (SUCTIONS) ×2 IMPLANT

## 2022-01-05 NOTE — Consult Note (Signed)
? ?  [facility] CM Inpatient Consult ? ? ?01/05/2022 ? ?[name] ?[birth year] ?[id number] ? ?[facility] Organization [ACO] Patient: Medicare ACO Reach ? ?Primary Care Provider:  Rossie Muskrat, [place] at [facility] is an embedded provider with a Chronic Care Management team and program, and is listed for the transition of care follow up and appointments. ? ?Patient was screened for Embedded practice service needs for chronic care management for post hospital.  Patient is post procedure at this time ? ?Plan: Will follow for progress and needs. A referral can be sent to the [facility] Management  for post hospital, if appropriate. ? ?Please contact for further questions, ? ?Natividad Brood, RN BSN CCM ?[facility] Liaison ? [phone] business mobile phone ?Toll free office [phone]  ?Fax number: [phone] ?[email or web address] ? ? ? ?

## 2022-01-05 NOTE — Progress Notes (Signed)
Orthopedic Tech Progress Note ?Patient Details:  ?[name] [name] ?1951-08-20 ?[id number] ? ?Ortho Devices ?Type of Ortho Device: Postop shoe/boot ?Ortho Device/Splint Location: right ?Ortho Device/Splint Interventions: Ordered ?  ?  ? ?Charline Bills Caleah Tortorelli ?01/05/2022, 1:38 PM ?Delivered post op shoe (ED version) ?

## 2022-01-05 NOTE — Progress Notes (Addendum)
ANTICOAGULATION CONSULT NOTE - Follow Up Consult ? ?Pharmacy Consult for Heparin (warfarin on hold) ?Indication:  mechanical mitral valve ? ?Allergies  ?Allergen Reactions  ? Erythromycin Nausea And Vomiting  ? Atorvastatin Other (See Comments)  ?  Pt reports causes body to be stiff, joints ache, aching in knees and ankles, feels more fatigued.   ? Lactose Intolerance (Gi) Other (See Comments)  ?  Upset stomach - yoghurt  ? Valsartan Other (See Comments)  ?  Pt reports caused her depression  ? ? ?Patient Measurements: ?Height: 5' 6.5" (168.9 cm) ?Weight: 65.8 kg (145 lb) ?IBW/kg (Calculated) : 60.45 ?Heparin Dosing Weight: 66 kg ? ?Vital Signs: ?Temp: 97.5 ?F (36.4 ?C) (03/20 1115) ?Temp Source: Oral (03/20 1115) ?BP: 106/49 (03/20 1115) ?Pulse Rate: 85 (03/20 1115) ? ?Labs: ?Recent Labs  ?  01/03/22 ?0308 01/03/22 ?2057 01/04/22 ?0502 01/05/22 ?0245  ?HGB 10.8*  --  11.5* 10.8*  ?HCT 31.4*  --  32.7* 30.6*  ?PLT 163  --  187 220  ?LABPROT 18.0*  --  14.7 14.1  ?INR 1.5*  --  1.2 1.1  ?HEPARINUNFRC  --  <0.10* 0.38 0.33  ?CREATININE  --   --  0.69 0.71  ? ? ? ?Estimated Creatinine Clearance: 60.7 mL/min (by C-G formula based on SCr of 0.71 mg/dL). ? ?Assessment: ?71 year old female with a mechanical mitral valve s/p ORIF on 01/05/22 for fracture of R-knee after fall. Pharmacy consulted to resume and manage heparin bridge to warfarin post-op. ? ?Heparin infusion stopped at 04:10 on 01/05/22. ?  ?PTA Warfarin regimen:  7.5 mg Sun/Tues/Thurs + 5 mg Mon/Wed/Fri/Sat ?- last warfarin dose 3/14 pm ? ?Goal of Therapy:  ?Heparin level 0.3-0.7 units/ml ?INR 2.5-3.5 ?Monitor platelets by anticoagulation protocol: Yes ?  ?Plan:  ?Restart heparin drip at 1100 units/hr starting at 16:00 on 01/05/22. ?Warfarin 7.'5mg'$  po x1 at 16:00 on 01/05/22 ? ?Check heparin level 8 hours after heparin infusion restarted.  ?Daily heparin CBC and PT/INR. ?Bridge warfarin with heparin infusion until INR > 2.5 for 2 consecutive days.  ? ?Kaleen Mask,  RPh ?01/05/2022,11:25 AM ? ? ?

## 2022-01-05 NOTE — Progress Notes (Signed)
No show appt

## 2022-01-05 NOTE — Progress Notes (Signed)
PT Cancellation Note ? ?Patient Details ?Name: [name] [name] ?MRN: [id number] ?DOB: Dec 05, 1950 ? ? ?Cancelled Treatment:    Reason Eval/Treat Not Completed: Patient at procedure or test/unavailable.  Retry as time and pt allow. ? ? ?Ramond Dial ?01/05/2022, 10:35 AM ? ?Mee Hives, PT PhD ?Acute Rehab Dept. Number: [facility] [phone] and [place] [phone] ? ?

## 2022-01-05 NOTE — Progress Notes (Signed)
Orthopedic Tech Progress Note ?Patient Details:  ?[name] [name] ?[birth year] ?[id number] ? ?Ortho Devices ?Type of Ortho Device: Postop shoe/boot ?Ortho Device/Splint Interventions: Ordered ?  ?  ? ?Charline Bills [name] [name] ?01/05/2022, 9:26 AM ?Received phone order. ?Delivered Post op shoe to OR desk. ?

## 2022-01-05 NOTE — Progress Notes (Signed)
PT Cancellation Note ? ?Patient Details ?Name: [name] [name] ?MRN: [id number] ?DOB: [birth year] ? ? ?Cancelled Treatment:    Reason Eval/Treat Not Completed: Other (comment).  Pt requesting to wait until the AM to try to stand on RW.  Discussed the parameters of permission to move with PT.  Follow up at another time. ? ? ?Ramond Dial ?01/05/2022, 3:07 PM ? ?Mee Hives, PT PhD ?Acute Rehab Dept. Number: [facility] [phone] and [place] [phone] ? ?

## 2022-01-05 NOTE — Anesthesia Postprocedure Evaluation (Signed)
Anesthesia Post Note ? ?Patient: [name] [name] ? ?Procedure(s) Performed: OPEN REDUCTION INTERNAL FIXATION (ORIF) TIBIAL PLATEAU AND SHAFT (Right) ? ?  ? ?Patient location during evaluation: PACU ?Anesthesia Type: General ?Level of consciousness: awake and alert ?Pain management: pain level controlled ?Vital Signs Assessment: post-procedure vital signs reviewed and stable ?Respiratory status: spontaneous breathing, nonlabored ventilation, respiratory function stable and patient connected to nasal cannula oxygen ?Cardiovascular status: blood pressure returned to baseline and stable ?Anesthetic complications: no ? ? ?No notable events documented. ? ?Last Vitals:  ?Vitals:  ? 01/05/22 1040 01/05/22 1055  ?BP: (!) 122/58 115/65  ?Pulse: 87 87  ?Resp: 12 17  ?Temp:  36.7 ?C  ?SpO2: 96% 97%  ?  ?Last Pain:  ?Vitals:  ? 01/05/22 1055  ?TempSrc:   ?PainSc: 0-No pain  ? ? ?  ?  ?  ?  ?  ?  ? ?[place] Lighter ? ? ? ? ?

## 2022-01-05 NOTE — Transfer of Care (Signed)
Immediate Anesthesia Transfer of Care Note ? ?Patient: [name] [name] ? ?Procedure(s) Performed: OPEN REDUCTION INTERNAL FIXATION (ORIF) TIBIAL PLATEAU AND SHAFT (Right) ? ?Patient Location: PACU ? ?Anesthesia Type:General ? ?Level of Consciousness: drowsy and patient cooperative ? ?Airway & Oxygen Therapy: Patient Spontanous Breathing ? ?Post-op Assessment: Report given to RN and Post -op Vital signs reviewed and stable ? ?Post vital signs: Reviewed and stable ? ?Last Vitals:  ?Vitals Value Taken Time  ?BP 122/75 01/05/22 1023  ?Temp    ?Pulse 86 01/05/22 1024  ?Resp 8 01/05/22 1024  ?SpO2 96 % 01/05/22 1024  ?Vitals shown include unvalidated device data. ? ?Last Pain:  ?Vitals:  ? 01/05/22 0718  ?TempSrc: Oral  ?PainSc:   ?   ? ?  ? ?Complications: No notable events documented. ?

## 2022-01-05 NOTE — Progress Notes (Signed)
?PROGRESS NOTE ? ?[name] [name]  AST:[id number] DOB: 1951-03-12 DOA: 01/01/2022 ?PCP: Howard Pouch A, DO  ? ?Brief Narrative: ?Patient is a 71 year old female with history of paroxysmal A-fib status post Maze procedure, mitral valve replacement with mechanical valve, on Coumadin, history of CVA, chronic diastolic CHF, hypertension who presented from home after a fall followed by right knee pain.  Imaging showed right tibial plateau fracture.  Orthopedics consulted, planning for ORIF after optimization of INR ? ?Assessment and Plan: ? ?* Tibial plateau fracture, right ?Tibial plateau fracture, proximal tibial fracture, fibular fracture, lateral malleolus fracture.   ?-Ortho following, planning for ORIF tomorrow ?-Coumadin on hold, INR down ?-Heparin on hold, surgery this morning ? ?Mechanical MVR ?Long term current use of anticoagulant ?Coumadin use in setting of PAF, mechanical MVR, prior CVA.   ?-She is at high risk for further CVA/thrombosis in the mitral valve .  ?-Coumadin on hold.,  INR down, heparin on hold ?-Resume heparin/Coumadin tonight ? ?Chronic diastolic CHF  ?Normal LVEF and functioning MV prosthesis as of 2d echo in 2021. ?-Euvolemic, monitor ? ?Essential hypertension ?-Continue Toprol and lisinopril ? ?Hypothyroidism ?Cont thyroid medication ? ?Normocytic anemia ?Mild, stable ? ?Paroxysmal atrial fibrillation (HCC) ?Currently in normal sinus rhythm.  Rate is controlled with metoprolol.  Anticoagulation on hold ? ?S/P mitral valve replacement ?Mechanical mitral valve.  On Coumadin currently on hold, see discussion above ? ?Nutrition Problem: Moderate Malnutrition ?Etiology: chronic illness (CHF, COPD) ?  ? ?DVT prophylaxis: Heparin GTT to start tonight ?warfarin (COUMADIN) tablet 7.5 mg  ? ?  Code Status: Full Code ?Family Communication: Discussed patient detail, no family at bedside ?Anticipated discharge to: Skilled nursing facility versus home health ? ? ?Consultants: Orthopedics ? ?Procedures:  None yet ? ?Antimicrobials:  ?Anti-infectives (From admission, onward)  ? ? Start     Dose/Rate Route Frequency Ordered Stop  ? 01/05/22 1200  ceFAZolin (ANCEF) IVPB 1 g/50 mL premix       ? 1 g ?100 mL/hr over 30 Minutes Intravenous Every 6 hours 01/05/22 1110 01/06/22 0559  ? 01/05/22 0730  ceFAZolin (ANCEF) IVPB 2g/100 mL premix       ? 2 g ?200 mL/hr over 30 Minutes Intravenous On call to O.R. 01/04/22 1201 01/05/22 0835  ? 01/05/22 0600  ceFAZolin (ANCEF) IVPB 2g/100 mL premix  Status:  Discontinued       ? 2 g ?200 mL/hr over 30 Minutes Intravenous On call to O.R. 01/05/22 0205 01/05/22 0227  ? ?  ? ? ?Subjective: ? ?-Feels okay overall, anxious about surgery ? ? ?Objective: ?Vitals:  ? 01/05/22 1025 01/05/22 1040 01/05/22 1055 01/05/22 1115  ?BP: 122/75 (!) 122/58 115/65 (!) 106/49  ?Pulse: 91 87 87 85  ?Resp: '16 12 17 20  '$ ?Temp: 98.4 ?F (36.9 ?C)  98 ?F (36.7 ?C) (!) 97.5 ?F (36.4 ?C)  ?TempSrc:    Oral  ?SpO2: 92% 96% 97% 98%  ?Weight:      ?Height:      ? ? ?Intake/Output Summary (Last 24 hours) at 01/05/2022 1311 ?Last data filed at 01/05/2022 1018 ?Gross per 24 hour  ?Intake 1300 ml  ?Output 25 ml  ?Net 1275 ml  ? ?Filed Weights  ? 01/01/22 1458 01/01/22 2000 01/05/22 0718  ?Weight: 59.9 kg 66.6 kg 65.8 kg  ? ? ?Examination: ? ?General exam: Pleasant female laying in bed, AAOx3, no distress ?HEENT: No JVD ?CVS: S1-S2, regular rhythm ?Lungs: Clear bilaterally  ?Abdomen: Soft, nontender, bowel sounds present  ?Extremities:  Right lower leg swollen, with dressing, splint ?Skin: No rashes, no ulcers,no icterus   ? ? ?Data Reviewed: I have personally reviewed following labs and imaging studies ? ?CBC: ?Recent Labs  ?Lab 01/01/22 ?1728 01/02/22 ?0246 01/03/22 ?0308 01/04/22 ?0502 01/05/22 ?0245  ?WBC 10.2 7.9 7.7 11.9* 11.2*  ?NEUTROABS 8.1*  --   --   --   --   ?HGB 12.6 10.6* 10.8* 11.5* 10.8*  ?HCT 36.8 31.3* 31.4* 32.7* 30.6*  ?MCV 96.8 95.7 95.2 93.7 93.9  ?PLT 200 154 163 187 220  ? ?Basic Metabolic  Panel: ?Recent Labs  ?Lab 01/01/22 ?1728 01/04/22 ?0502 01/05/22 ?0245  ?NA 140 136 133*  ?K 3.5 3.9 3.9  ?CL 102 101 100  ?CO2 '29 26 25  '$ ?GLUCOSE 125* 128* 138*  ?BUN 22 25* 23  ?CREATININE 0.73 0.69 0.71  ?CALCIUM 9.0 8.8* 8.7*  ? ? ? ?Recent Results (from the past 240 hour(s))  ?Resp Panel by RT-PCR (Flu A&B, Covid) Nasopharyngeal Swab     Status: None  ? Collection Time: 01/01/22  5:29 PM  ? Specimen: Nasopharyngeal Swab; Nasopharyngeal(NP) swabs in vial transport medium  ?Result Value Ref Range Status  ? SARS Coronavirus 2 by RT PCR NEGATIVE NEGATIVE Final  ?  Comment: (NOTE) ?SARS-CoV-2 target nucleic acids are NOT DETECTED. ? ?The SARS-CoV-2 RNA is generally detectable in upper respiratory ?specimens during the acute phase of infection. The lowest ?concentration of SARS-CoV-2 viral copies this assay can detect is ?138 copies/mL. A negative result does not preclude SARS-Cov-2 ?infection and should not be used as the sole basis for treatment or ?other patient management decisions. A negative result may occur with  ?improper specimen collection/handling, submission of specimen other ?than nasopharyngeal swab, presence of viral mutation(s) within the ?areas targeted by this assay, and inadequate number of viral ?copies(<138 copies/mL). A negative result must be combined with ?clinical observations, patient history, and epidemiological ?information. The expected result is Negative. ? ?Fact Sheet for Patients:  ?EntrepreneurPulse.com.au ? ?Fact Sheet for Healthcare Providers:  ?IncredibleEmployment.be ? ?This test is no t yet approved or cleared by the [place] FDA and  ?has been authorized for detection and/or diagnosis of SARS-CoV-2 by ?FDA under an Emergency Use Authorization (EUA). This EUA will remain  ?in effect (meaning this test can be used) for the duration of the ?COVID-19 declaration under Section 564(b)(1) of the Act, 21 ?U.S.C.section 360bbb-3(b)(1), unless the  authorization is terminated  ?or revoked sooner.  ? ? ?  ? Influenza A by PCR NEGATIVE NEGATIVE Final  ? Influenza B by PCR NEGATIVE NEGATIVE Final  ?  Comment: (NOTE) ?The Xpert Xpress SARS-CoV-2/FLU/RSV plus assay is intended as an aid ?in the diagnosis of influenza from Nasopharyngeal swab specimens and ?should not be used as a sole basis for treatment. Nasal washings and ?aspirates are unacceptable for Xpert Xpress SARS-CoV-2/FLU/RSV ?testing. ? ?Fact Sheet for Patients: ?EntrepreneurPulse.com.au ? ?Fact Sheet for Healthcare Providers: ?IncredibleEmployment.be ? ?This test is not yet approved or cleared by the [place] FDA and ?has been authorized for detection and/or diagnosis of SARS-CoV-2 by ?FDA under an Emergency Use Authorization (EUA). This EUA will remain ?in effect (meaning this test can be used) for the duration of the ?COVID-19 declaration under Section 564(b)(1) of the Act, 21 U.S.C. ?section 360bbb-3(b)(1), unless the authorization is terminated or ?revoked. ? ?Performed at [facility], [address] [place], [place] [address] ?  ?Surgical pcr screen     Status: Abnormal  ?  Collection Time: 01/02/22  1:10 PM  ? Specimen: Nasal Mucosa; Nasal Swab  ?Result Value Ref Range Status  ? MRSA, PCR (A) NEGATIVE Final  ?  INVALID, UNABLE TO DETERMINE THE PRESENCE OF TARGET DUE TO SPECIMEN INTEGRITY. RECOLLECTION REQUESTED.  ? Staphylococcus aureus (A) NEGATIVE Final  ?  INVALID, UNABLE TO DETERMINE THE PRESENCE OF TARGET DUE TO SPECIMEN INTEGRITY. RECOLLECTION REQUESTED.  ?  Comment:  SPOKE TO RN FOR RECOLLECT [phone] '@1648'$  FH ?INVALID, UNABLE TO DETERMINE THE PRESENCE OF TARGET DUE TO SPECIMEN INTEGRITY. RECOLLECTION REQUESTED. ?Performed at [facility], [place] [address], [place], [place] [address] ?  ?Surgical pcr screen     Status: None  ? Collection Time: 01/02/22  4:50 PM  ? Specimen: Nasal Mucosa; Nasal Swab  ?Result Value Ref Range Status  ?  MRSA, PCR NEGATIVE NEGATIVE Final  ? Staphylococcus aureus NEGATIVE NEGATIVE Final  ?  Comment: (NOTE) ?The Xpert SA Assay (FDA approved for NASAL specimens in patients 80 ?years of age and older), is one compone

## 2022-01-05 NOTE — Op Note (Signed)
01/05/2022 ?11:04 AM ? ?PATIENT:  [name] [name]  71 y.o. female ?[id number] ? ?PRE-OPERATIVE DIAGNOSIS:   ?1. RIGHT BICONDYLAR TIBIAL PLATEAU FRACTURE ?2. RIGHT TIBIAL SPINE/ EMINENCE FRACTURE ?3. RIGHT KNEE HEMARTHROSIS ?4. RIGHT LATERAL MALLEOLUS FRACTURE ? ?POST-OPERATIVE DIAGNOSIS:   ?1. RIGHT BICONDYLAR TIBIAL PLATEAU FRACTURE ?2. RIGHT TIBIAL SPINE/ EMINENCE FRACTURE ?3. RIGHT KNEE HEMARTHROSIS ?4. RIGHT LATERAL MALLEOLUS FRACTURE ? ?PROCEDURE:  Procedure(s): ?1. OPEN REDUCTION INTERNAL FIXATION (ORIF) RIGHT BICONDYLAR TIBIAL PLATEAU ?2. OPEN TREATMENT OF TIBIAL SPINE/ EMINENCE FRACTURE RIGHT  ?3. OPEN TREATMENT OF TIBIAL SHAFT FRACTURE RIGHT  ?4. ANTERIOR COMPARTMENT FASCIOTOMY ?5. OPEN REDUCTION INTERNAL FIXATION (ORIF) RIGHT LATERAL MALLEOLUS ?6. ASPIRATION OF RIGHT  KNEE HEMARTHROSIS ? ?SURGEON:  Surgeon(s) and Role: ?   Altamese Fairdale, MD - Primary ? ?PHYSICIAN ASSISTANT: Ainsley Spinner, PA-C ? ?ANESTHESIA:   general ? ?EBL:  25 mL  ? ?BLOOD ADMINISTERED:none ? ?DRAINS: none  ? ?LOCAL MEDICATIONS USED:  NONE ? ?SPECIMEN: None ? ?DISPOSITION OF SPECIMEN:  N/A ? ?COUNTS:  YES ? ?TOURNIQUET:  * No tourniquets in log * ? ?DICTATION: Written below. ? ?PLAN OF CARE: Admit to inpatient  ? ?PATIENT DISPOSITION:  PACU - hemodynamically stable. ?  ?Delay start of Pharmacological VTE agent (>24hrs) due to surgical blood loss or risk of bleeding: no ? ?   ?BRIEF SUMMARY AND INDICATION FOR PROCEDURE:  Patient is a 71 y.o.-year- ?old with a tibial plateau fracture, treated provisionally with splinting, elevation, and active motion of the foot and toes to facilitate resolution of soft tissue ?swelling.  We did discuss with the patient risks and ?benefits of surgical treatment including the potential for arthritis, ?nerve injury, vessel injury, loss of motion, DVT, PE, heart attack, ?stroke, symptomatic hardware, her profound osteoporosis with potential need for further surgery, and multiple others.  The patient  acknowledged these risks and wished to proceed. ?  ?BRIEF SUMMARY OF PROCEDURE:  After administration of preoperative antibiotics, the patient was taken to the operating room.  General anesthesia was induced and the lower extremity prepped and draped with retention of the external fixator in usual sterile fashion using a chlorhexidine wash and betadine scrub and paint. Pressure areas were noted from the prior splints. A timeout was performed. I then brought in towel bumps and adjusted the fracture with the help of my assistant to gain more length and slightly adjust the alignment. A small slightly curvilinear incision was made extending laterally over Gerdy's tubercle. Dissection was carried down where the soft tissues were left intact to the lateral plateau and rim, elevated only the insertion of the extensors. I was then able to insert the 9 hole Zimmer NCB tibial plateau plate and pin it just on the lateral condyle proximally at the joint line. I maneuvered the medial and lateral condyles into position with the tibial spine positioned so that it would be secured between to the two condylar fragments. I used the [employer] clamp to compress these three components together and achieve reduction. After placing two proximal screws at the subchondral joint line of the bicondylar plateau, I placed a single distal screw and then turned attention to the tibial shaft component of the fracture. ? ?With regard to the shaft, I first placed a lag screw in the metaphysis to swing the shaft into proper alignment from a valgus position. I then placed the most proximal bicortical screw to further improved this, returning once more to the lag screw at the top of shaft component. Once I was  satisfied with shaft reduction an additional screw placed distally, then the lag removed. Final images showed excellent alignment and three bicortical screws distally. At this point, I placed the remaining screw at the joint line and one more  below it, all of which we would later convert to locked fixation by placing locking caps. No locking caps were placed distally. Final images showed appropriate reduction, implant position and length. All wounds were irrigated thoroughly. ?  ?Prior to closure, I turned my attention to the distal edge of the wound ?here underneath the skin.  I used the long scissors to spread both ?superficial and deep to the anterior compartment.  The fascia was then ?released for 8 to 10 cm to reduce the likelihood of the postoperative ?compartment syndrome.  Once more, wound was irrigated and then a ?standard layered closure performed, 0 Vicryl, 2-0 Vicryl, and 3-0 nylon ?for the skin.  Sterile gently compressive dressing was applied and in ?knee immobilizer.   ? ?I then turned attention to the ankle. C-arm confirmed position of the fracture and that there was motion with the application of manual stress under fluoroscopy. Consequently, it was appropriate to proceed with repair. I used C arm and small cannulated 4.5 mm screw with good purchase on the proximal side of the fracture and low profile of the head. The wound was irrigated and closed with application of soft dressing and silicone padding of the pressure area on the heel to allow for surveillance.  ? ?Lastly, the large knee hemarthrosis was aspirated removing only 20 cc of dark blood, much of which must have been somewhat congealed. Ainsley Spinner, PA-C was present and assisting throughout with all portions of the procedure. The patient was taken to the PACU in stable condition.  ?  ?PROGNOSIS: The patient will not require formal bracing given the stable examination post fixation. Unrestricted range of motion will begin immediately with nonweightbearing on the operative extremity, resumption of pharmacologic DVT prophylaxis, and mobilize with PT and OT. After discharge, we will plan to see patient back in about 2 weeks for removal of sutures. Discharge anticipated in two  days. ?  ?  ?  ?  ?Astrid Divine. Marcelino Scot, M.D.  ?

## 2022-01-05 NOTE — Anesthesia Procedure Notes (Signed)
Procedure Name: Intubation ?Date/Time: 01/05/2022 8:30 AM ?Performed by: Lance Coon, CRNA ?Pre-anesthesia Checklist: Patient identified, Emergency Drugs available, Suction available, Patient being monitored and Timeout performed ?Patient Re-evaluated:Patient Re-evaluated prior to induction ?Oxygen Delivery Method: Circle system utilized ?Preoxygenation: Pre-oxygenation with 100% oxygen ?Induction Type: IV induction ?Ventilation: Mask ventilation without difficulty ?Laryngoscope Size: Sabra Heck and 3 ?Grade View: Grade I ?Tube type: Oral ?Tube size: 7.0 mm ?Number of attempts: 1 ?Airway Equipment and Method: Stylet ?Placement Confirmation: ETT inserted through vocal cords under direct vision, positive ETCO2 and breath sounds checked- equal and bilateral ?Secured at: 21 cm ?Tube secured with: Tape ?Dental Injury: Teeth and Oropharynx as per pre-operative assessment  ? ? ? ? ?

## 2022-01-05 NOTE — Progress Notes (Signed)
OT Cancellation Note ? ?Patient Details ?Name: [name] [name] ?MRN: [id number] ?DOB: [birth year] ? ? ?Cancelled Treatment:     Patient currently off floor for surgery. OT will continue to follow as time permits.  ? ?Corinne Ports E. Cason Luffman, OTR/L ?Acute Rehabilitation Services ?[phone] ?[phone]  ? ?[name] ?01/05/2022, 8:02 AM ?

## 2022-01-05 NOTE — Progress Notes (Signed)
Report given to [place], [place]. ?Pt transported to short stay. ?

## 2022-01-06 ENCOUNTER — Inpatient Hospital Stay (HOSPITAL_COMMUNITY): Payer: Medicare Other

## 2022-01-06 DIAGNOSIS — I10 Essential (primary) hypertension: Secondary | ICD-10-CM | POA: Diagnosis not present

## 2022-01-06 DIAGNOSIS — S82141A Displaced bicondylar fracture of right tibia, initial encounter for closed fracture: Secondary | ICD-10-CM | POA: Diagnosis not present

## 2022-01-06 LAB — BASIC METABOLIC PANEL
Anion gap: 6 (ref 5–15)
BUN: 24 mg/dL — ABNORMAL HIGH (ref 8–23)
CO2: 27 mmol/L (ref 22–32)
Calcium: 9.1 mg/dL (ref 8.9–10.3)
Chloride: 102 mmol/L (ref 98–111)
Creatinine, Ser: 0.65 mg/dL (ref 0.44–1.00)
GFR, Estimated: >60 mL/min (ref >60)
Glucose, Bld: 132 mg/dL — ABNORMAL HIGH (ref 70–99)
Potassium: 4.4 mmol/L (ref 3.5–5.1)
Sodium: 135 mmol/L (ref 135–145)

## 2022-01-06 LAB — PROTIME-INR
INR: 1.1 (ref 0.8–1.2)
Prothrombin Time: 14.6 seconds (ref 11.4–15.2)

## 2022-01-06 LAB — CBC
HCT: 29 % — ABNORMAL LOW (ref 36.0–46.0)
Hemoglobin: 10 g/dL — ABNORMAL LOW (ref 12.0–15.0)
MCH: 32.8 pg (ref 26.0–34.0)
MCHC: 34.5 g/dL (ref 30.0–36.0)
MCV: 95.1 fL (ref 80.0–100.0)
Platelets: 222 10*3/uL (ref 150–400)
RBC: 3.05 MIL/uL — ABNORMAL LOW (ref 3.87–5.11)
RDW: 13.3 % (ref 11.5–15.5)
WBC: 13.2 10*3/uL — ABNORMAL HIGH (ref 4.0–10.5)
nRBC: 0 % (ref 0.0–0.2)

## 2022-01-06 LAB — HEPARIN LEVEL (UNFRACTIONATED)
Heparin Unfractionated: 0.18 IU/mL — ABNORMAL LOW (ref 0.30–0.70)
Heparin Unfractionated: 0.38 IU/mL (ref 0.30–0.70)
Heparin Unfractionated: 0.36 IU/mL (ref 0.30–0.70)

## 2022-01-06 MED ORDER — WARFARIN SODIUM 7.5 MG PO TABS
7.5000 mg | ORAL_TABLET | Freq: Once | ORAL | Status: AC
  Administered 2022-01-06: 7.5 mg via ORAL
  Filled 2022-01-06: qty 1

## 2022-01-06 MED ORDER — DOCUSATE SODIUM 100 MG PO CAPS
100.0000 mg | ORAL_CAPSULE | Freq: Every day | ORAL | Status: DC
  Administered 2022-01-11: 100 mg via ORAL
  Filled 2022-01-06 (×3): qty 1

## 2022-01-06 MED ORDER — HEPARIN BOLUS VIA INFUSION
2000.0000 [IU] | Freq: Once | INTRAVENOUS | Status: DC
  Filled 2022-01-06: qty 2000

## 2022-01-06 NOTE — Progress Notes (Addendum)
ANTICOAGULATION CONSULT NOTE - Follow Up Consult ? ?Pharmacy Consult for heparin ?Indication:  MVR ? ?Labs: ?Recent Labs  ?  01/04/22 ?0502 01/05/22 ?3785 01/06/22 ?0243  ?HGB 11.5* 10.8* 10.0*  ?HCT 32.7* 30.6* 29.0*  ?PLT 187 220 222  ?LABPROT 14.7 14.1 14.6  ?INR 1.2 1.1 1.1  ?HEPARINUNFRC 0.38 0.33 0.18*  ?CREATININE 0.69 0.71 0.65  ? ? ?Assessment: ?71yo female subtherapeutic on heparin after resuming s/p ORIF, two levels at low end of goal at this rate though had been trending down ; no infusion issues or signs of bleeding per RN. ? ?Goal of Therapy:  ?Heparin level 0.3-0.7 units/ml ?  ?Plan:  ?Will increase heparin infusion by 2-3 units/kg/hr to 1250 units/hr and check level in 6 hours.   ? ?Wynona Neat, PharmD, BCPS  ?01/06/2022,4:27 AM ? ? ?

## 2022-01-06 NOTE — Plan of Care (Signed)

## 2022-01-06 NOTE — Evaluation (Signed)
Occupational Therapy Evaluation ?Patient Details ?Name: [name] [name] ?MRN: [id number] ?DOB: 03-17-1951 ?Today's Date: 01/06/2022 ? ? ?History of Present Illness Pt is a 71 yr old female who presented 3/16 due to a fall when transfering from bed to walker at home. A-ray showed  tibia plateau, tibia shaft, and lat malleolus fxs.S/p R ORIF on 3/20.  PMH: PAF s/p maze, MVR a/p replacement valve, multiple strokes, HTN, HFpEF, breast ca, COPD,  ? ?Clinical Impression ?  ?Pt is very motivated to participate in therapies but was limited in session due to multiple loose BM. Pt was able to roll side to side in med with min guard but total assist for peri care for BM. Pt completed Ue dressing with min assist and LE dressing in bed level with max assist at bed level. Pt completed supine to sit ad sit to supine with min guard. Pt currently with functional limitations due to the deficits listed below (see OT Problem List).  Pt will benefit from skilled OT to increase their safety and independence with ADL and functional mobility for ADL to facilitate discharge to venue listed below.  ?  ?   ? ?Recommendations for follow up therapy are one component of a multi-disciplinary discharge planning process, led by the attending physician.  Recommendations may be updated based on patient status, additional functional criteria and insurance authorization.  ? ?Follow Up Recommendations ? Acute inpatient rehab (3hours/day)  ?  ?Assistance Recommended at Discharge Frequent or constant Supervision/Assistance  ?Patient can return home with the following A lot of help with walking and/or transfers;A lot of help with bathing/dressing/bathroom;Assistance with cooking/housework;Assist for transportation ? ?  ?Functional Status Assessment ? Patient has had a recent decline in their functional status and demonstrates the ability to make significant improvements in function in a reasonable and predictable amount of time.  ?Equipment Recommendations ?  None recommended by OT  ?  ?Recommendations for Other Services   ? ? ?  ?Precautions / Restrictions Precautions ?Precautions: Fall ?Restrictions ?Weight Bearing Restrictions: Yes ?RLE Weight Bearing: Non weight bearing  ? ?  ? ?Mobility Bed Mobility ?Overal bed mobility: Needs Assistance ?Bed Mobility: Rolling, Supine to Sit, Sit to Supine ?Rolling: Min guard ?  ?Supine to sit: Min guard ?Sit to supine: Min guard ?  ?General bed mobility comments: Pt need cues on hand position ?  ? ?Transfers ?  ?  ?  ?  ?  ?  ?  ?  ?  ?General transfer comment: Did not complete ?  ? ?  ?Balance Overall balance assessment: Needs assistance ?Sitting-balance support: Bilateral upper extremity supported, Single extremity supported, Feet supported ?Sitting balance-Leahy Scale: Fair ?  ?  ?  ?  ?  ?  ?  ?  ?  ?  ?  ?  ?  ?  ?  ?  ?   ? ?ADL either performed or assessed with clinical judgement  ? ?ADL Overall ADL's : Needs assistance/impaired ?Eating/Feeding: Independent;Sitting ?  ?Grooming: Wash/dry hands;Wash/dry face;Set up;Bed level ?  ?Upper Body Bathing: Minimal assistance;Cueing for safety;Cueing for sequencing;Sitting ?  ?Lower Body Bathing: Maximal assistance;Cueing for safety;Cueing for sequencing;Bed level ?  ?Upper Body Dressing : Minimal assistance;Bed level ?  ?Lower Body Dressing: Maximal assistance;Bed level ?  ?  ?  ?  ?  ?  ?  ?  ?General ADL Comments: Deffered OOB activity as reported tired from multiple loose BMs  ? ? ? ?Vision Baseline Vision/History: 1 Wears glasses ?Ability to  See in Adequate Light: 0 Adequate ?Patient Visual Report: No change from baseline ?   ?   ?Perception   ?  ?Praxis   ?  ? ?Pertinent Vitals/Pain Pain Assessment ?Pain Assessment: Faces ?Faces Pain Scale: Hurts little more ?Pain Location: RLE ?Pain Descriptors / Indicators: Aching, Grimacing, Guarding ?Pain Intervention(s): Limited activity within patient's tolerance, Monitored during session  ? ? ? ?Hand Dominance Right ?  ?Extremity/Trunk  Assessment Upper Extremity Assessment ?Upper Extremity Assessment: Overall WFL for tasks assessed (hx fx at RUE) ?  ?Lower Extremity Assessment ?Lower Extremity Assessment: Defer to PT evaluation ?  ?Cervical / Trunk Assessment ?Cervical / Trunk Assessment: Normal ?  ?Communication Communication ?Communication: No difficulties ?  ?Cognition Arousal/Alertness: Awake/alert ?Behavior During Therapy: [facility] for tasks assessed/performed ?Overall Cognitive Status: Within Functional Limits for tasks assessed ?  ?  ?  ?  ?  ?  ?  ?  ?  ?  ?  ?  ?  ?  ?  ?  ?  ?  ?  ?General Comments    ? ?  ?Exercises   ?  ?Shoulder Instructions    ? ? ?Home Living Family/patient expects to be discharged to:: Private residence ?Living Arrangements: Alone ?Available Help at Discharge: Friend(s);Neighbor ?Type of Home: House ?Home Access: Stairs to enter ?Entrance Stairs-Number of Steps: 4 ?Entrance Stairs-Rails: Can reach both ?Home Layout: Two level ?Alternate Level Stairs-Number of Steps: 14 ?Alternate Level Stairs-Rails: Can reach both ?Bathroom Shower/Tub: Walk-in shower ?  ?Bathroom Toilet: Standard ?  ?  ?Home Equipment: Conservation officer, nature (2 wheels);Rollator (4 wheels);Grab bars - toilet;Grab bars - tub/shower;Shower seat ?  ?  ?  ? ?  ?Prior Functioning/Environment Prior Level of Function : Independent/Modified Independent ?  ?  ?  ?  ?  ?  ?  ?  ?  ? ?  ?  ?OT Problem List: Decreased strength;Decreased range of motion;Decreased activity tolerance;Impaired balance (sitting and/or standing);Decreased safety awareness;Decreased knowledge of use of DME or AE;Cardiopulmonary status limiting activity;Pain ?  ?   ?OT Treatment/Interventions: Self-care/ADL training;Therapeutic exercise;DME and/or AE instruction;Therapeutic activities;Patient/family education;Balance training  ?  ?OT Goals(Current goals can be found in the care plan section) Acute Rehab OT Goals ?Patient Stated Goal: to be able to go to rehab ?OT Goal Formulation: With  patient ?Time For Goal Achievement: 01/20/22 ?Potential to Achieve Goals: Good ?ADL Goals ?Pt Will Perform Upper Body Dressing: Independently;sitting ?Pt Will Perform Lower Body Dressing: with min guard assist;sit to/from stand ?Pt Will Transfer to Toilet: squat pivot transfer;bedside commode;with min assist ?Pt Will Perform Tub/Shower Transfer: with mod assist;Squat pivot transfer  ?OT Frequency: Min 2X/week ?  ? ?Co-evaluation   ?  ?  ?  ?  ? ?  ?AM-PAC OT "6 Clicks" Daily Activity     ?Outcome Measure Help from another person eating meals?: None ?Help from another person taking care of personal grooming?: A Little ?Help from another person toileting, which includes using toliet, bedpan, or urinal?: A Lot ?Help from another person bathing (including washing, rinsing, drying)?: A Lot ?Help from another person to put on and taking off regular upper body clothing?: A Little ?Help from another person to put on and taking off regular lower body clothing?: A Lot ?6 Click Score: 16 ?  ?End of Session Nurse Communication: Mobility status (loose bms) ? ?Activity Tolerance: Patient limited by fatigue ?Patient left: in bed;with call bell/phone within reach;with bed alarm set ? ?OT Visit Diagnosis: Unsteadiness on feet (R26.81);Other abnormalities  of gait and mobility (R26.89);Muscle weakness (generalized) (M62.81);Pain ?Pain - Right/Left: Right ?Pain - part of body: Leg  ?              ?Time: 0981-1914 ?OT Time Calculation (min): 54 min ?Charges:  OT General Charges ?$OT Visit: 1 Visit ?OT Evaluation ?$OT Eval Low Complexity: 1 Low ?OT Treatments ?$Self Care/Home Management : 38-52 mins ? ?Joeseph Amor OTR/L  ?Acute Rehab Services  ?[phone] office number ?[phone] pager number ? ? ?Joeseph Amor ?01/06/2022, 10:49 AM ?

## 2022-01-06 NOTE — Progress Notes (Signed)
ANTICOAGULATION CONSULT NOTE - Follow Up Consult ? ?Pharmacy Consult for Heparin bridge to warfarin ?Indication:  mechanical mitral valve + AFib ? ?Allergies  ?Allergen Reactions  ? Erythromycin Nausea And Vomiting  ? Atorvastatin Other (See Comments)  ?  Pt reports causes body to be stiff, joints ache, aching in knees and ankles, feels more fatigued.   ? Lactose Intolerance (Gi) Other (See Comments)  ?  Upset stomach - yoghurt  ? Valsartan Other (See Comments)  ?  Pt reports caused her depression  ? ? ?Patient Measurements: ?Height: 5' 6.5" (168.9 cm) ?Weight: 65.8 kg (145 lb) ?IBW/kg (Calculated) : 60.45 ?Heparin Dosing Weight: 66 kg ? ?Vital Signs: ?Temp: 98.3 ?F (36.8 ?C) (03/21 0845) ?Temp Source: Oral (03/21 0400) ?BP: 127/67 (03/21 0845) ?Pulse Rate: 84 (03/21 0845) ? ?Labs: ?Recent Labs  ?  01/04/22 ?0502 01/05/22 ?5621 01/06/22 ?0243 01/06/22 ?1133  ?HGB 11.5* 10.8* 10.0*  --   ?HCT 32.7* 30.6* 29.0*  --   ?PLT 187 220 222  --   ?LABPROT 14.7 14.1 14.6  --   ?INR 1.2 1.1 1.1  --   ?HEPARINUNFRC 0.38 0.33 0.18* 0.38  ?CREATININE 0.69 0.71 0.65  --   ? ? ? ?Estimated Creatinine Clearance: 60.7 mL/min (by C-G formula based on SCr of 0.65 mg/dL). ? ?Assessment: ?71 year old female with a mechanical mitral valve and AFib s/p ORIF on 01/05/22 for fracture of R-knee after fall. Pharmacy consulted to resume and manage heparin bridge to warfarin post-op. ? ?Heparin level 0.38, therapeutic ?INR 1.1, subtherapeutic ?H/H/Plt stable ?  ?PTA Warfarin regimen:  7.5 mg Sun/Tues/Thurs + 5 mg Mon/Wed/Fri/Sat ? ?Goal of Therapy:  ?Heparin level 0.3-0.7 units/ml ?INR 2.5-3.5 ?Monitor platelets by anticoagulation protocol: Yes ?  ?Plan:  ?Continue heparin infusion at therapeutic rate of 1250 units/hr  ?Warfarin 7.'5mg'$  po x1 at 16:00 on 01/06/22 ? ?Repeat heparin level in 8 hours  ?Daily heparin CBC, heparin level and PT/INR. ?Bridge warfarin with heparin infusion until INR > 2.5 for 2 consecutive days.  ? ?Kaleen Mask,  RPh ?01/06/2022,12:44 PM ? ? ?

## 2022-01-06 NOTE — Progress Notes (Signed)
ANTICOAGULATION CONSULT NOTE - Follow Up Consult ? ?Pharmacy Consult for Heparin bridge to warfarin ?Indication:  mechanical mitral valve + AFib ? ?Allergies  ?Allergen Reactions  ? Erythromycin Nausea And Vomiting  ? Atorvastatin Other (See Comments)  ?  Pt reports causes body to be stiff, joints ache, aching in knees and ankles, feels more fatigued.   ? Lactose Intolerance (Gi) Other (See Comments)  ?  Upset stomach - yoghurt  ? Valsartan Other (See Comments)  ?  Pt reports caused her depression  ? ? ?Patient Measurements: ?Height: 5' 6.5" (168.9 cm) ?Weight: 65.8 kg (145 lb) ?IBW/kg (Calculated) : 60.45 ?Heparin Dosing Weight: 66 kg ? ?Vital Signs: ?Temp: 98.1 ?F (36.7 ?C) (03/21 1933) ?Temp Source: Oral (03/21 1933) ?BP: 128/48 (03/21 1933) ?Pulse Rate: 93 (03/21 1933) ? ?Labs: ?Recent Labs  ?  01/04/22 ?0502 01/05/22 ?3825 01/06/22 ?0243 01/06/22 ?1133 01/06/22 ?2012  ?HGB 11.5* 10.8* 10.0*  --   --   ?HCT 32.7* 30.6* 29.0*  --   --   ?PLT 187 220 222  --   --   ?LABPROT 14.7 14.1 14.6  --   --   ?INR 1.2 1.1 1.1  --   --   ?HEPARINUNFRC 0.38 0.33 0.18* 0.38 0.36  ?CREATININE 0.69 0.71 0.65  --   --   ? ? ? ?Estimated Creatinine Clearance: 60.7 mL/min (by C-G formula based on SCr of 0.65 mg/dL). ? ?Assessment: ?71 year old female with a mechanical mitral valve and AFib s/p ORIF on 01/05/22 for fracture of R-knee after fall. Pharmacy consulted to resume and manage heparin bridge to warfarin post-op. ? ?Heparin level 0.38, therapeutic ?INR 1.1, subtherapeutic ?H/H/Plt stable ?  ?PTA Warfarin regimen:  7.5 mg Sun/Tues/Thurs + 5 mg Mon/Wed/Fri/Sat ? ?Heparin level therapeutic tonight. Cont same rate.  ?Goal of Therapy:  ?Heparin level 0.3-0.7 units/ml ?INR 2.5-3.5 ?Monitor platelets by anticoagulation protocol: Yes ?  ?Plan:  ?Continue heparin infusion at therapeutic rate of 1250 units/hr  ?Warfarin 7.'5mg'$  po x1 at 16:00 on 01/06/22 ? ? ?Onnie Boer, PharmD, BCIDP, AAHIVP, CPP ?Infectious Disease Pharmacist ?01/06/2022  8:56 PM ? ? ? ? ?

## 2022-01-06 NOTE — Care Management Important Message (Signed)
Important Message ? ?Patient Details  ?Name: [name] [name] ?MRN: [id number] ?Date of Birth: [birth year] ? ? ?Medicare Important Message Given:  Yes ? ? ? ? ?[name] ?01/06/2022, 12:16 PM ?

## 2022-01-06 NOTE — Progress Notes (Signed)
? ?                              Orthopaedic Trauma Service Progress Note ? ?Patient ID: ?[name] [name] ?MRN: [id number] ?DOB/AGE: 1951/04/17 71 y.o. ? ?Subjective: ? ?Feels much better than pre-op  ?No specific complaints this am  ?Would like to go to CIR if possible  ?Will need SNF as backup  ? ?Discussed that she will be unable to drive for about 9 weeks  ? ?Also has a left 5th metatarsal fracture that will be treated non-op ? ?ROS ?As above ? ?Objective:  ? ?VITALS:   ?Vitals:  ? 01/05/22 1820 01/05/22 2009 01/06/22 0400 01/06/22 0845  ?BP: (!) 114/95 128/72 (!) 123/59 127/67  ?Pulse: 91 89 90 84  ?Resp: '18 18 17 18  '$ ?Temp: 97.6 ?F (36.4 ?C) (!) 97.5 ?F (36.4 ?C) 98 ?F (36.7 ?C) 98.3 ?F (36.8 ?C)  ?TempSrc: Oral Oral Oral   ?SpO2: 100% 98% 98% 98%  ?Weight:      ?Height:      ? ? ?Estimated body mass index is 23.05 kg/m? as calculated from the following: ?  Height as of this encounter: 5' 6.5" (1.689 m). ?  Weight as of this encounter: 65.8 kg. ? ? ?Intake/Output   ?   03/20 0701 ?03/21 0700 03/21 0701 ?03/22 0700  ? I.V. (mL/kg) 1437.2 (21.8)   ? IV Piggyback 150   ? Total Intake(mL/kg) 1587.2 (24.1)   ? Urine (mL/kg/hr) 400 (0.3)   ? Blood 25   ? Total Output 425   ? Net +1162.2   ?     ? Urine Occurrence 1 x   ? Stool Occurrence 1 x   ?  ? ?LABS ? ?Results for orders placed or performed during the hospital encounter of 01/01/22 (from the past 24 hour(s))  ?Heparin level (unfractionated)     Status: Abnormal  ? Collection Time: 01/06/22  2:43 AM  ?Result Value Ref Range  ? Heparin Unfractionated 0.18 (L) 0.30 - 0.70 IU/mL  ?CBC     Status: Abnormal  ? Collection Time: 01/06/22  2:43 AM  ?Result Value Ref Range  ? WBC 13.2 (H) 4.0 - 10.5 K/uL  ? RBC 3.05 (L) 3.87 - 5.11 MIL/uL  ? Hemoglobin 10.0 (L) 12.0 - 15.0 g/dL  ? HCT 29.0 (L) 36.0 - 46.0 %  ? MCV 95.1 80.0 - 100.0 fL  ? MCH 32.8 26.0 - 34.0 pg  ? MCHC 34.5 30.0 - 36.0 g/dL  ? RDW 13.3 11.5 - 15.5 %  ? Platelets  222 150 - 400 K/uL  ? nRBC 0.0 0.0 - 0.2 %  ?Protime-INR     Status: None  ? Collection Time: 01/06/22  2:43 AM  ?Result Value Ref Range  ? Prothrombin Time 14.6 11.4 - 15.2 seconds  ? INR 1.1 0.8 - 1.2  ?Basic metabolic panel     Status: Abnormal  ? Collection Time: 01/06/22  2:43 AM  ?Result Value Ref Range  ? Sodium 135 135 - 145 mmol/L  ? Potassium 4.4 3.5 - 5.1 mmol/L  ? Chloride 102 98 - 111 mmol/L  ? CO2 27 22 - 32 mmol/L  ? Glucose, Bld 132 (H) 70 - 99 mg/dL  ? BUN 24 (H) 8 - 23 mg/dL  ? Creatinine, Ser 0.65 0.44 - 1.00 mg/dL  ? Calcium 9.1 8.9 - 10.3 mg/dL  ? GFR, Estimated >60 >60 mL/min  ?  Anion gap 6 5 - 15  ? ?*Note: Due to a large number of results and/or encounters for the requested time period, some results have not been displayed. A complete set of results can be found in Results Review.  ? ? ? ?PHYSICAL EXAM:  ? ?Gen: sitting up in bed, cleaning her hair, pleasant ?Lungs: unlabored ?Ext:  ?     Right Lower Extremity  ? Dressing clean, dry and intact ? Minimal swelling ? Ext warm  ? Compartments are soft  ? No pain with passive stretching  ? Motor and sensory functions intact ? Knee is resting in flexion  ? Pt noted to have pressure wounds to heel and dorsum of R foot in OR  ?  Will reassess tomorrow  ? ?     Left Lower extremity  ? No change in left foot exam ? Ecchymosis stable ? No further increase in pain  ? ? ?Assessment/Plan: ?1 Day Post-Op  ? ? ? ? ?Anti-infectives (From admission, onward)  ? ? Start     Dose/Rate Route Frequency Ordered Stop  ? 01/05/22 1200  ceFAZolin (ANCEF) IVPB 1 g/50 mL premix       ? 1 g ?100 mL/hr over 30 Minutes Intravenous Every 6 hours 01/05/22 1110 01/05/22 2341  ? 01/05/22 0730  ceFAZolin (ANCEF) IVPB 2g/100 mL premix       ? 2 g ?200 mL/hr over 30 Minutes Intravenous On call to O.R. 01/04/22 1201 01/05/22 0835  ? 01/05/22 0600  ceFAZolin (ANCEF) IVPB 2g/100 mL premix  Status:  Discontinued       ? 2 g ?200 mL/hr over 30 Minutes Intravenous On call to O.R.  01/05/22 0205 01/05/22 0227  ? ?  ?. ? ?POD/HD#: 1 ? ?71 y/o female s/p fall with R tibial plateau, R distal fibula fracture, L 5th metatarsal fracture. History of mitral valve replacement, chronic anticoagulation, MS, osteoporosis, thyroid disease, CHF  ? ?-fall  ? ?- R tibial plateau and shaft fracture s/p ORIF  ? NWB R leg x 6-8 weeks ? Unrestricted ROM R knee  ? PT/OT evals ? Ice and elevate for swelling and pain control  ? CIR consult ? TOC consult for SNF  ? ? FLOAT HEELS OFF BED- pt with pressure sore to R heel  ? ?PT- please teach HEP for R knee ROM- AROM, PROM. No ROM restrictions.  Quad sets, SLR, LAQ, SAQ, heel slides, stretching ? ?Ankle theraband program, heel cord stretching, toe towel curls, etc ? ?No pillows under bend of knee when at rest, ok to place under heel to help work on extension. Can also use zero knee bone foam if available ? ?-R lateral malleolus fracture s/p screw fixation  ? NWB as above ? No bracing  ? No ROM restrictions  ? ?- L 5th MTT fracture ? Non-op ? WBAT with post op shoe for transfers only  ? Shoe only needs to be on when transferring   ? ?- Pain management: ? Multimodal ? Doing well on tylenol only  ? ?- ABL anemia/Hemodynamics ? Stable  ? Monitor ? ?- Medical issues  ? Per primary  ? ?- DVT/PE prophylaxis: ? Heparin bridge to coumadin  ? ?- ID:  ? Periop abx ? ?- Metabolic Bone Disease: ? Vitamin d looks good ? Known osteoporosis  ? Confirmed clinically in OR, bone very soft  ? Time to consider pharmacologic treatment for osteoporosis  ? Will discuss with pt in outpt setting if she would  like to discuss this with her PCP or if she would like to go to a provider that specializes in osteoporosis  ? DEXA 2022 done at [facility] shows t score of -3.4 at R femoral neck  ? ?- Activity: ? As above ? ?- Impediments to fracture healing: ? Osteoporosis  ?  ?- Dispo: ? Therapy evals  ? CIR consult  ? TOC for SNF as backup  ? ? ? ?Jari Pigg, PA-C ?[phone] (C) ?01/06/2022, 11:19  AM ? ?[facility] ?[place] [address] ?[phone] Jenetta Downer) ?[phone] (F) ? ? ? ?After 5pm and on the weekends please log on to Amion, go to orthopaedics and the look under the [facility] Call for the provider(s) on call. You can also call our office at [phone] and then follow the prompts to be connected to the call team.  ? Patient ID: [name] [name], female   DOB: October 23, 1950, 71 y.o.   MRN: [id number] ? ?

## 2022-01-06 NOTE — Progress Notes (Addendum)
?PROGRESS NOTE ? ?[name] [name]  LNL:[id number] DOB: 08/05/1951 DOA: 01/01/2022 ?PCP: Howard Pouch A, DO  ? ?Brief Narrative: ?Patient is a 71 year old female with history of paroxysmal A-fib status post Maze procedure, mitral valve replacement with mechanical valve, on Coumadin, history of CVA, chronic diastolic CHF, hypertension who presented from home after a fall followed by right knee pain.  Imaging showed right tibial plateau fracture.  Orthopedics consulted,  ?-Coumadin held, started on IV heparin, ?-Underwent ORIF 3/20, heparin/coumadin resumed postop ? ?Assessment and Plan: ? ?* Tibial plateau fracture, right ?Tibial plateau fracture, proximal tibial fracture, fibular fracture, lateral malleolus fracture.   ?-Ortho following, underwent ORIF right bicondylar tibial plateau, open treatment of tibial spine, tibial shaft, anterior compartment fasciotomy, aspiration of right knee hemarthrosis ?-Back on heparin, Coumadin ?-PT eval today ? ?H/o Mechanical MVR ?Long term current use of anticoagulant ?Coumadin use in setting of PAF, mechanical MVR, prior CVA.   ?-Coumadin with heparin bridge resumed last night, INR subtherapeutic ? ?Chronic diastolic CHF  ?Normal LVEF and functioning MV prosthesis as of 2d echo in 2021. ?-Euvolemic, monitor ? ?Essential hypertension ?-Continue Toprol and lisinopril ? ?Hypothyroidism ?Cont thyroid medication ? ?Normocytic anemia ?Mild, stable ? ?Paroxysmal atrial fibrillation (HCC) ?-Continue Coumadin ? ?Nutrition Problem: Moderate Malnutrition ?Etiology: chronic illness (CHF, COPD) ?  ? ?DVT prophylaxis: Heparin /coumadin ?  Code Status: Full Code ?Family Communication: Discussed patient detail, no family at bedside ?Anticipated discharge to: SNF vs CIR ? ? ?Consultants: Orthopedics ? ?Procedures: ?PROCEDURE:  Procedure(s): ?1. OPEN REDUCTION INTERNAL FIXATION (ORIF) RIGHT BICONDYLAR TIBIAL PLATEAU ?2. OPEN TREATMENT OF TIBIAL SPINE/ EMINENCE FRACTURE RIGHT  ?3. OPEN TREATMENT OF  TIBIAL SHAFT FRACTURE RIGHT  ?3. ANTERIOR COMPARTMENT FASCIOTOMY ?4. ASPIRATION OF RIGHT  KNEE HEMARTHROSIS ?  ?SURGEON:  Surgeon(s) and Role: ?   Altamese Phillips, MD - Primary ? ?Antimicrobials:  ?Anti-infectives (From admission, onward)  ? ? Start     Dose/Rate Route Frequency Ordered Stop  ? 01/05/22 1200  ceFAZolin (ANCEF) IVPB 1 g/50 mL premix       ? 1 g ?100 mL/hr over 30 Minutes Intravenous Every 6 hours 01/05/22 1110 01/05/22 2341  ? 01/05/22 0730  ceFAZolin (ANCEF) IVPB 2g/100 mL premix       ? 2 g ?200 mL/hr over 30 Minutes Intravenous On call to O.R. 01/04/22 1201 01/05/22 0835  ? 01/05/22 0600  ceFAZolin (ANCEF) IVPB 2g/100 mL premix  Status:  Discontinued       ? 2 g ?200 mL/hr over 30 Minutes Intravenous On call to O.R. 01/05/22 0205 01/05/22 0227  ? ?  ? ? ?Subjective: ?-Feels okay overall, tolerated surgery, pain controlled for the most part, requiring Tylenol, having BMs, denies chest pain or dyspnea ? ?Objective: ?Vitals:  ? 01/05/22 1820 01/05/22 2009 01/06/22 0400 01/06/22 0845  ?BP: (!) 114/95 128/72 (!) 123/59 127/67  ?Pulse: 91 89 90 84  ?Resp: '18 18 17 18  '$ ?Temp: 97.6 ?F (36.4 ?C) (!) 97.5 ?F (36.4 ?C) 98 ?F (36.7 ?C) 98.3 ?F (36.8 ?C)  ?TempSrc: Oral Oral Oral   ?SpO2: 100% 98% 98% 98%  ?Weight:      ?Height:      ? ? ?Intake/Output Summary (Last 24 hours) at 01/06/2022 1134 ?Last data filed at 01/06/2022 0434 ?Gross per 24 hour  ?Intake 287.19 ml  ?Output 400 ml  ?Net -112.81 ml  ? ?Filed Weights  ? 01/01/22 1458 01/01/22 2000 01/05/22 0718  ?Weight: 59.9 kg 66.6 kg 65.8 kg  ? ? ?  Examination: ? ?General exam: Pleasant female sitting up in bed, AAOx3, no distress ?HEENT: No JVD ?CVS: J0-K9, regular metallic click ?Lungs: Clear bilaterally ?Abdomen soft bowel sounds present ?Extremities: Right lower leg swollen with dressing  ?Skin: No rashes, no ulcers,no icterus   ? ? ?Data Reviewed: I have personally reviewed following labs and imaging studies ? ?CBC: ?Recent Labs  ?Lab 01/01/22 ?1728  01/02/22 ?0246 01/03/22 ?0308 01/04/22 ?0502 01/05/22 ?3818 01/06/22 ?0243  ?WBC 10.2 7.9 7.7 11.9* 11.2* 13.2*  ?NEUTROABS 8.1*  --   --   --   --   --   ?HGB 12.6 10.6* 10.8* 11.5* 10.8* 10.0*  ?HCT 36.8 31.3* 31.4* 32.7* 30.6* 29.0*  ?MCV 96.8 95.7 95.2 93.7 93.9 95.1  ?PLT 200 154 163 187 220 222  ? ?Basic Metabolic Panel: ?Recent Labs  ?Lab 01/01/22 ?1728 01/04/22 ?0502 01/05/22 ?2993 01/06/22 ?0243  ?NA 140 136 133* 135  ?K 3.5 3.9 3.9 4.4  ?CL 102 101 100 102  ?CO2 '29 26 25 27  '$ ?GLUCOSE 125* 128* 138* 132*  ?BUN 22 25* 23 24*  ?CREATININE 0.73 0.69 0.71 0.65  ?CALCIUM 9.0 8.8* 8.7* 9.1  ? ? ? ?Recent Results (from the past 240 hour(s))  ?Resp Panel by RT-PCR (Flu A&B, Covid) Nasopharyngeal Swab     Status: None  ? Collection Time: 01/01/22  5:29 PM  ? Specimen: Nasopharyngeal Swab; Nasopharyngeal(NP) swabs in vial transport medium  ?Result Value Ref Range Status  ? SARS Coronavirus 2 by RT PCR NEGATIVE NEGATIVE Final  ?  Comment: (NOTE) ?SARS-CoV-2 target nucleic acids are NOT DETECTED. ? ?The SARS-CoV-2 RNA is generally detectable in upper respiratory ?specimens during the acute phase of infection. The lowest ?concentration of SARS-CoV-2 viral copies this assay can detect is ?138 copies/mL. A negative result does not preclude SARS-Cov-2 ?infection and should not be used as the sole basis for treatment or ?other patient management decisions. A negative result may occur with  ?improper specimen collection/handling, submission of specimen other ?than nasopharyngeal swab, presence of viral mutation(s) within the ?areas targeted by this assay, and inadequate number of viral ?copies(<138 copies/mL). A negative result must be combined with ?clinical observations, patient history, and epidemiological ?information. The expected result is Negative. ? ?Fact Sheet for Patients:  ?EntrepreneurPulse.com.au ? ?Fact Sheet for Healthcare Providers:  ?IncredibleEmployment.be ? ?This test is  no t yet approved or cleared by the [place] FDA and  ?has been authorized for detection and/or diagnosis of SARS-CoV-2 by ?FDA under an Emergency Use Authorization (EUA). This EUA will remain  ?in effect (meaning this test can be used) for the duration of the ?COVID-19 declaration under Section 564(b)(1) of the Act, 21 ?U.S.C.section 360bbb-3(b)(1), unless the authorization is terminated  ?or revoked sooner.  ? ? ?  ? Influenza A by PCR NEGATIVE NEGATIVE Final  ? Influenza B by PCR NEGATIVE NEGATIVE Final  ?  Comment: (NOTE) ?The Xpert Xpress SARS-CoV-2/FLU/RSV plus assay is intended as an aid ?in the diagnosis of influenza from Nasopharyngeal swab specimens and ?should not be used as a sole basis for treatment. Nasal washings and ?aspirates are unacceptable for Xpert Xpress SARS-CoV-2/FLU/RSV ?testing. ? ?Fact Sheet for Patients: ?EntrepreneurPulse.com.au ? ?Fact Sheet for Healthcare Providers: ?IncredibleEmployment.be ? ?This test is not yet approved or cleared by the [place] FDA and ?has been authorized for detection and/or diagnosis of SARS-CoV-2 by ?FDA under an Emergency Use Authorization (EUA). This EUA will remain ?in effect (meaning this test can be used) for the  duration of the ?COVID-19 declaration under Section 564(b)(1) of the Act, 21 U.S.C. ?section 360bbb-3(b)(1), unless the authorization is terminated or ?revoked. ? ?Performed at [facility], [address] ?[place], [place] [address] ?  ?Surgical pcr screen     Status: Abnormal  ? Collection Time: 01/02/22  1:10 PM  ? Specimen: Nasal Mucosa; Nasal Swab  ?Result Value Ref Range Status  ? MRSA, PCR (A) NEGATIVE Final  ?  INVALID, UNABLE TO DETERMINE THE PRESENCE OF TARGET DUE TO SPECIMEN INTEGRITY. RECOLLECTION REQUESTED.  ? Staphylococcus aureus (A) NEGATIVE Final  ?  INVALID, UNABLE TO DETERMINE THE PRESENCE OF TARGET DUE TO SPECIMEN INTEGRITY. RECOLLECTION REQUESTED.  ?  Comment:  SPOKE TO  RN FOR RECOLLECT [phone] '@1648'$  FH ?INVALID, UNABLE TO DETERMINE THE PRESENCE OF TARGET DUE TO SPECIMEN INTEGRITY. RECOLLECTION REQUESTED. ?Performed at [facility], [place],

## 2022-01-06 NOTE — Progress Notes (Signed)
Orthopedic Tech Progress Note ?Patient Details:  ?[name] [name] ?Sep 25, 1951 ?[id number] ? ?Ortho Devices ?Type of Ortho Device: Bone foam zero knee ?Ortho Device/Splint Location: RLE ?Ortho Device/Splint Interventions: Ordered, Application ?  ?Post Interventions ?Patient Tolerated: Fair, Well ?Instructions Provided: Care of device ? ?Janit Pagan ?01/06/2022, 5:47 PM ? ?

## 2022-01-06 NOTE — Evaluation (Signed)
Physical Therapy Evaluation ?Patient Details ?Name: [name] [name] ?MRN: [id number] ?DOB: 06-Mar-1951 ?Today's Date: 01/06/2022 ? ?History of Present Illness ? Pt is a 71 yr old female who presented 3/16 due to a fall. A-ray showed  tibia plateau, tibia shaft, and lat malleolus fxs.S/p R ORIF on 3/20.  Pt also with  Acute minimally displaced fracture of the fifth metatarsal on the L.  PMH: PAF s/p maze, MVR a/p replacement valve, multiple strokes, HTN, HFpEF, breast ca, COPD,  ?Clinical Impression ? Pt admitted secondary to problem above with deficits below. Pt requiring min guard to min A for bed mobility and max A to attempt standing X2. Only able to achieve minimal clearance with +1 assist. Pt was previously independent. Recommending AIR level therapies to increase independence and safety. Will continue to follow acutely.    ?   ? ?Recommendations for follow up therapy are one component of a multi-disciplinary discharge planning process, led by the attending physician.  Recommendations may be updated based on patient status, additional functional criteria and insurance authorization. ? ?Follow Up Recommendations Acute inpatient rehab (3hours/day) ? ?  ?Assistance Recommended at Discharge Frequent or constant Supervision/Assistance  ?Patient can return home with the following ? Two people to help with walking and/or transfers;Two people to help with bathing/dressing/bathroom;Help with stairs or ramp for entrance;Assist for transportation;Assistance with cooking/housework ? ?  ?Equipment Recommendations Wheelchair cushion (measurements PT);Wheelchair (measurements PT)  ?Recommendations for Other Services ?    ?  ?Functional Status Assessment Patient has had a recent decline in their functional status and demonstrates the ability to make significant improvements in function in a reasonable and predictable amount of time.  ? ?  ?Precautions / Restrictions Precautions ?Precautions: Fall ?Required Braces or Orthoses:  Other Brace ?Other Brace: post op shoe ?Restrictions ?Weight Bearing Restrictions: Yes ?RLE Weight Bearing: Non weight bearing ?LLE Weight Bearing: Weight bearing as tolerated (in post op shoe for transfers only) ?Other Position/Activity Restrictions: Per notes, unrestricted ROM  ? ?  ? ?Mobility ? Bed Mobility ?Overal bed mobility: Needs Assistance ?Bed Mobility: Rolling, Supine to Sit, Sit to Supine ?Rolling: Min guard ?  ?Supine to sit: Min guard ?Sit to supine: Min assist ?  ?General bed mobility comments: Pt need cues on hand position. Min A for RLE assist to reutnr to supine. ?  ? ?Transfers ?Overall transfer level: Needs assistance ?Equipment used: Rolling walker (2 wheels) ?Transfers: Sit to/from Stand ?  ?  ?  ?  ?  ?  ?General transfer comment: Attempted to stand X2 from elevated bed height, however, only able to get minimal clearance with max A +2. Was able to scoot up towards [facility] with min guard A and increased effort. ?  ? ?Ambulation/Gait ?  ?  ?  ?  ?  ?  ?  ?  ? ?Stairs ?  ?  ?  ?  ?  ? ?Wheelchair Mobility ?  ? ?Modified Rankin (Stroke Patients Only) ?  ? ?  ? ?Balance Overall balance assessment: Needs assistance ?Sitting-balance support: Bilateral upper extremity supported, Single extremity supported, Feet supported ?Sitting balance-Leahy Scale: Fair ?  ?  ?  ?  ?  ?  ?  ?  ?  ?  ?  ?  ?  ?  ?  ?  ?   ? ? ? ?Pertinent Vitals/Pain Pain Assessment ?Pain Assessment: Faces ?Faces Pain Scale: Hurts little more ?Pain Location: RLE ?Pain Descriptors / Indicators: Aching, Grimacing, Guarding ?Pain Intervention(s): Limited activity  within patient's tolerance, Monitored during session, Repositioned  ? ? ?Home Living Family/patient expects to be discharged to:: Private residence ?Living Arrangements: Alone ?Available Help at Discharge: Friend(s);Neighbor ?Type of Home: House ?Home Access: Stairs to enter ?Entrance Stairs-Rails: Can reach both ?Entrance Stairs-Number of Steps: 4 ?Alternate Level Stairs-Number  of Steps: 14 ?Home Layout: Two level ?Home Equipment: Conservation officer, nature (2 wheels);Rollator (4 wheels);Grab bars - toilet;Grab bars - tub/shower;Shower seat ?   ?  ?Prior Function Prior Level of Function : Independent/Modified Independent ?  ?  ?  ?  ?  ?  ?  ?  ?  ? ? ?Hand Dominance  ? Dominant Hand: Right ? ?  ?Extremity/Trunk Assessment  ? Upper Extremity Assessment ?Upper Extremity Assessment: Defer to OT evaluation ?  ? ?Lower Extremity Assessment ?Lower Extremity Assessment: RLE deficits/detail ?RLE Deficits / Details: Deficits consistent with post op pain and weakness. ?  ? ?Cervical / Trunk Assessment ?Cervical / Trunk Assessment: Normal  ?Communication  ? Communication: No difficulties  ?Cognition Arousal/Alertness: Awake/alert ?Behavior During Therapy: [facility] for tasks assessed/performed ?Overall Cognitive Status: Within Functional Limits for tasks assessed ?  ?  ?  ?  ?  ?  ?  ?  ?  ?  ?  ?  ?  ?  ?  ?  ?  ?  ?  ? ?  ?General Comments   ? ?  ?Exercises    ? ?Assessment/Plan  ?  ?PT Assessment Patient needs continued PT services  ?PT Problem List Decreased strength;Decreased activity tolerance;Decreased balance;Decreased mobility;Decreased knowledge of use of DME;Decreased knowledge of precautions ? ?   ?  ?PT Treatment Interventions DME instruction;Functional mobility training;Therapeutic activities;Therapeutic exercise;Balance training;Patient/family education   ? ?PT Goals (Current goals can be found in the Care Plan section)  ?Acute Rehab PT Goals ?Patient Stated Goal: to be independent ?PT Goal Formulation: With patient ?Time For Goal Achievement: 01/20/22 ?Potential to Achieve Goals: Good ? ?  ?Frequency Min 5X/week ?  ? ? ?Co-evaluation   ?  ?  ?  ?  ? ? ?  ?AM-PAC PT "6 Clicks" Mobility  ?Outcome Measure Help needed turning from your back to your side while in a flat bed without using bedrails?: A Little ?Help needed moving from lying on your back to sitting on the side of a flat bed without using  bedrails?: A Little ?Help needed moving to and from a bed to a chair (including a wheelchair)?: Total ?Help needed standing up from a chair using your arms (e.g., wheelchair or bedside chair)?: Total ?Help needed to walk in hospital room?: Total ?Help needed climbing 3-5 steps with a railing? : Total ?6 Click Score: 10 ? ?  ?End of Session Equipment Utilized During Treatment: Gait belt ?Activity Tolerance: Patient tolerated treatment well ?Patient left: in bed;with call bell/phone within reach ?Nurse Communication: Mobility status ?PT Visit Diagnosis: Other abnormalities of gait and mobility (R26.89);Muscle weakness (generalized) (M62.81);Difficulty in walking, not elsewhere classified (R26.2) ?  ? ?Time: 5809-9833 ?PT Time Calculation (min) (ACUTE ONLY): 24 min ? ? ?Charges:   PT Evaluation ?$PT Eval Moderate Complexity: 1 Mod ?PT Treatments ?$Therapeutic Activity: 8-22 mins ?  ?   ? ? ?Reuel Derby, PT, DPT  ?Acute Rehabilitation Services  ?Pager: [phone] ?Office: [phone] ? ? ?[place] ?01/06/2022, 12:26 PM ? ?

## 2022-01-07 ENCOUNTER — Ambulatory Visit: Payer: Medicare Other | Admitting: Neurology

## 2022-01-07 ENCOUNTER — Encounter (HOSPITAL_COMMUNITY): Payer: Self-pay | Admitting: Orthopedic Surgery

## 2022-01-07 DIAGNOSIS — I480 Paroxysmal atrial fibrillation: Secondary | ICD-10-CM

## 2022-01-07 DIAGNOSIS — E038 Other specified hypothyroidism: Secondary | ICD-10-CM | POA: Diagnosis not present

## 2022-01-07 DIAGNOSIS — I5032 Chronic diastolic (congestive) heart failure: Secondary | ICD-10-CM | POA: Diagnosis not present

## 2022-01-07 DIAGNOSIS — Z952 Presence of prosthetic heart valve: Secondary | ICD-10-CM

## 2022-01-07 DIAGNOSIS — S82141D Displaced bicondylar fracture of right tibia, subsequent encounter for closed fracture with routine healing: Secondary | ICD-10-CM | POA: Diagnosis not present

## 2022-01-07 LAB — CBC
HCT: 30.2 % — ABNORMAL LOW (ref 36.0–46.0)
Hemoglobin: 10.3 g/dL — ABNORMAL LOW (ref 12.0–15.0)
MCH: 33 pg (ref 26.0–34.0)
MCHC: 34.1 g/dL (ref 30.0–36.0)
MCV: 96.8 fL (ref 80.0–100.0)
Platelets: 251 10*3/uL (ref 150–400)
RBC: 3.12 MIL/uL — ABNORMAL LOW (ref 3.87–5.11)
RDW: 13.9 % (ref 11.5–15.5)
WBC: 8.9 10*3/uL (ref 4.0–10.5)
nRBC: 0 % (ref 0.0–0.2)

## 2022-01-07 LAB — BASIC METABOLIC PANEL
Anion gap: 9 (ref 5–15)
BUN: 28 mg/dL — ABNORMAL HIGH (ref 8–23)
CO2: 27 mmol/L (ref 22–32)
Calcium: 9.1 mg/dL (ref 8.9–10.3)
Chloride: 100 mmol/L (ref 98–111)
Creatinine, Ser: 0.72 mg/dL (ref 0.44–1.00)
GFR, Estimated: >60 mL/min (ref >60)
Glucose, Bld: 103 mg/dL — ABNORMAL HIGH (ref 70–99)
Potassium: 4.1 mmol/L (ref 3.5–5.1)
Sodium: 136 mmol/L (ref 135–145)

## 2022-01-07 LAB — PROTIME-INR
INR: 1.4 — ABNORMAL HIGH (ref 0.8–1.2)
Prothrombin Time: 16.9 seconds — ABNORMAL HIGH (ref 11.4–15.2)

## 2022-01-07 LAB — HEPARIN LEVEL (UNFRACTIONATED): Heparin Unfractionated: 0.38 IU/mL (ref 0.30–0.70)

## 2022-01-07 MED ORDER — WARFARIN SODIUM 7.5 MG PO TABS
7.5000 mg | ORAL_TABLET | Freq: Once | ORAL | Status: AC
Start: 2022-01-07 — End: 2022-01-07
  Administered 2022-01-07: 7.5 mg via ORAL
  Filled 2022-01-07: qty 1

## 2022-01-07 NOTE — Progress Notes (Signed)
?  Progress Note ? ? ?Patient: [name] [name] HCW:[id number] DOB: June 28, 1951 DOA: 01/01/2022     6 ?DOS: the patient was seen and examined on 01/07/2022 ?  ?Brief hospital course: ?Patient is a 71 year old female with history of paroxysmal A-fib status post Maze procedure, mitral valve replacement with mechanical valve, on Coumadin, history of CVA, chronic diastolic CHF, hypertension who presented from home after a fall followed by right knee pain.  Imaging showed right tibial plateau fracture.  Orthopedics consulted,  ?-Coumadin held, started on IV heparin, ?-Underwent ORIF 3/20, heparin/coumadin resumed postop ? ?Assessment and Plan: ?* Tibial plateau fracture, right ?Tibial plateau fracture, proximal tibial fracture, fibular fracture, lateral malleolus fracture.  ?Sp ORIF ?Continue anticoagulation with warfarin  ? ?CHF (congestive heart failure) ([place]) ?Normal LVEF and functioning MV prosthesis as of 2d echo in 2021. ?No clinical signs of exacerbation  ? ?Essential hypertension ?Continue blood pressure control with lisinopril and metoprolol.  ? ?Hypothyroidism ?On levothyroxine  ? ?S/P mitral valve replacement ?Mechanical mitral valve.  ?Continue anticoagulation with warfarin  ? ?Paroxysmal atrial fibrillation (HCC) ?Rate control with metoprolol and anticoagulation with warfarin  ? ?Normocytic anemia ?Stable hgb and hct  ? ?Malnutrition of moderate degree ?Continue with nutritional supplements.  ? ? ? ? ?  ? ?Subjective: patient is feeling well, no nausea or vomiting, no dyspnea or chest pain  ? ?Physical Exam: ?Vitals:  ? 01/06/22 1933 01/07/22 0422 01/07/22 0746 01/07/22 1500  ?BP: (!) 128/48 134/61 (!) 133/54 (!) 117/58  ?Pulse: 93 85 79 79  ?Resp:  '18 17 18  '$ ?Temp: 98.1 ?F (36.7 ?C) 98.2 ?F (36.8 ?C) 98.1 ?F (36.7 ?C) 98.3 ?F (36.8 ?C)  ?TempSrc: Oral Oral Oral Oral  ?SpO2: 99% 100% 98% 97%  ?Weight:      ?Height:      ? ?Neurology awake and alert ?ENT with mild pallor ?Cardiovascular with S1 and S2 present  a ?Respiratory with no wheezing ?Abdomen soft ?No lower extremity edema  ?Data Reviewed: ? ? ? ?Family Communication: no family at the bedside  ? ?Disposition: ?Status is: Inpatient ?Remains inpatient appropriate because: pending SNF  ? Planned Discharge Destination: Skilled nursing facility ? ? ? ?Author: ?Tawni Millers, MD ?01/07/2022 6:12 PM ? ?For on call review [email or web address].  ?

## 2022-01-07 NOTE — Progress Notes (Signed)
Occupational Therapy Treatment ?Patient Details ?Name: [name] [name] ?MRN: [id number] ?DOB: 1951/06/11 ?Today's Date: 01/07/2022 ? ? ?History of present illness Pt is a 71 yr old female who presented 3/16 due to a fall. A-ray showed  tibia plateau, tibia shaft, and lat malleolus fxs.S/p R ORIF on 3/20.  Pt also with  Acute minimally displaced fracture of the fifth metatarsal on the L.  PMH: PAF s/p maze, MVR a/p replacement valve, multiple strokes, HTN, HFpEF, breast ca, COPD, ?  ?OT comments ? Pt making slow progress towards goals this session, able to don post op shoe using figure 4 technique sitting EOB with min A, min guard-min A for bed mobility this session secondary to pain. Pt max A +2 for transfers, attempted x2 with and without use of RW, pt minimally able to clear hips from bed despite bed elevated. Educated pt on performing lateral scoot transfer to drop-arm chair, however pt declining to sit up in chair at this time, able to scoot to L side towards [place]. Pt at times appears to be self-limiting and anxious with mobility, benefits from increased education and encouragement to mobilize. Pt presenting with impairments listed below, will follow acutely. Continue to recommend AIR at d/c.  ? ?Recommendations for follow up therapy are one component of a multi-disciplinary discharge planning process, led by the attending physician.  Recommendations may be updated based on patient status, additional functional criteria and insurance authorization. ?   ?Follow Up Recommendations ? Acute inpatient rehab (3hours/day)  ?  ?Assistance Recommended at Discharge Frequent or constant Supervision/Assistance  ?Patient can return home with the following ? A lot of help with walking and/or transfers;A lot of help with bathing/dressing/bathroom;Assistance with cooking/housework;Assist for transportation ?  ?Equipment Recommendations ? None recommended by OT  ?  ?Recommendations for Other Services   ? ?  ?Precautions /  Restrictions Precautions ?Precautions: Fall ?Required Braces or Orthoses: Other Brace ?Other Brace: post op shoe ?Restrictions ?Weight Bearing Restrictions: Yes ?RLE Weight Bearing: Non weight bearing ?LLE Weight Bearing: Weight bearing as tolerated (in post op shoe, transfers only) ?Other Position/Activity Restrictions: Per notes, unrestricted ROM  ? ? ?  ? ?Mobility Bed Mobility ?Overal bed mobility: Needs Assistance ?Bed Mobility: Supine to Sit, Sit to Supine ?  ?  ?Supine to sit: Min guard ?Sit to supine: Min assist ?  ?General bed mobility comments: min A to return to bed to assist RLE ?  ? ?Transfers ?Overall transfer level: Needs assistance ?Equipment used: Rolling walker (2 wheels) ?Transfers: Sit to/from Stand ?  ?  ?  ?  ?  ?  ?General transfer comment: Attempted to stand X2 from elevated bed height, however, only able to get minimal clearance with max A +2. Was able to scoot up towards [facility] with min guard A and increased effort. ?  ?  ?Balance Overall balance assessment: Needs assistance ?Sitting-balance support: Bilateral upper extremity supported, Single extremity supported, Feet supported ?Sitting balance-Leahy Scale: Fair ?  ?  ?  ?  ?  ?  ?  ?  ?  ?  ?  ?  ?  ?  ?  ?  ?   ? ?ADL either performed or assessed with clinical judgement  ? ?ADL Overall ADL's : Needs assistance/impaired ?  ?  ?  ?  ?  ?  ?  ?  ?  ?  ?Lower Body Dressing: Minimal assistance;Sitting/lateral leans ?Lower Body Dressing Details (indicate cue type and reason): doffs post op shoe in figure  4 ?  ?  ?  ?  ?  ?  ?Functional mobility during ADLs: Maximal assistance;+2 for physical assistance ?  ?  ? ?Extremity/Trunk Assessment Upper Extremity Assessment ?Upper Extremity Assessment: Generalized weakness ?  ?Lower Extremity Assessment ?Lower Extremity Assessment: Defer to PT evaluation ?  ?  ?  ? ?Vision   ?Vision Assessment?: No apparent visual deficits ?  ?Perception Perception ?Perception: Not tested ?  ?Praxis Praxis ?Praxis: Not  tested ?  ? ?Cognition Arousal/Alertness: Awake/alert ?Behavior During Therapy: [facility] for tasks assessed/performed ?Overall Cognitive Status: Within Functional Limits for tasks assessed ?  ?  ?  ?  ?  ?  ?  ?  ?  ?  ?  ?  ?  ?  ?  ?  ?  ?  ?  ?   ?Exercises   ? ?  ?Shoulder Instructions   ? ? ?  ?General Comments pt benefitting from increased education/encouragement, stating "I can't" frequently during session, at times appears to be self-limiting  ? ? ?Pertinent Vitals/ Pain       Pain Assessment ?Pain Assessment: Faces ?Pain Score: 5  ?Faces Pain Scale: Hurts even more ?Pain Location: RLE ?Pain Descriptors / Indicators: Aching, Grimacing, Guarding ?Pain Intervention(s): Limited activity within patient's tolerance, Monitored during session, Repositioned ? ?Home Living   ?  ?  ?  ?  ?  ?  ?  ?  ?  ?  ?  ?  ?Bathroom Accessibility: Yes ?How Accessible: Accessible via walker ?  ?  ?  ?  ? ?  ?Prior Functioning/Environment    ?  ?  ?  ?   ? ?Frequency ? Min 2X/week  ? ? ? ? ?  ?Progress Toward Goals ? ?OT Goals(current goals can now be found in the care plan section) ? Progress towards OT goals: Progressing toward goals ? ?Acute Rehab OT Goals ?Patient Stated Goal: to get better ?OT Goal Formulation: With patient ?Time For Goal Achievement: 01/20/22 ?Potential to Achieve Goals: Good ?ADL Goals ?Pt Will Perform Upper Body Dressing: Independently;sitting ?Pt Will Perform Lower Body Dressing: with min guard assist;sit to/from stand ?Pt Will Transfer to Toilet: squat pivot transfer;bedside commode;with min assist ?Pt Will Perform Tub/Shower Transfer: with mod assist;Squat pivot transfer  ?Plan Discharge plan remains appropriate;Frequency remains appropriate   ? ?Co-evaluation ? ? ?   ?  ?  ?  ?  ? ?  ?AM-PAC OT "6 Clicks" Daily Activity     ?Outcome Measure ? ? Help from another person eating meals?: None ?Help from another person taking care of personal grooming?: A Little ?Help from another person toileting, which includes  using toliet, bedpan, or urinal?: A Lot ?Help from another person bathing (including washing, rinsing, drying)?: A Lot ?Help from another person to put on and taking off regular upper body clothing?: A Little ?Help from another person to put on and taking off regular lower body clothing?: A Lot ?6 Click Score: 16 ? ?  ?End of Session Equipment Utilized During Treatment: Gait belt;Rolling walker (2 wheels) ? ?OT Visit Diagnosis: Unsteadiness on feet (R26.81);Other abnormalities of gait and mobility (R26.89);Muscle weakness (generalized) (M62.81);Pain ?Pain - Right/Left: Right ?Pain - part of body: Leg ?  ?Activity Tolerance Patient limited by fatigue ?  ?Patient Left in bed;with call bell/phone within reach;with bed alarm set;with nursing/sitter in room ?  ?Nurse Communication Mobility status ?  ? ?   ? ?Time: 4580-9983 ?OT Time Calculation (min): 34 min ? ?  Charges: OT General Charges ?$OT Visit: 1 Visit ?OT Treatments ?$Self Care/Home Management : 8-22 mins ?$Therapeutic Activity: 8-22 mins ? ?Lynnda Child, OTD, OTR/L ?Acute Rehab ?[phone] ? ? ?Kaylyn Lim ?01/07/2022, 10:55 AM ?

## 2022-01-07 NOTE — PMR Pre-admission (Shared)
PMR Admission Coordinator Pre-Admission Assessment ? ?Patient: BRITTLYN CLOE is an 71 y.o., female ?MRN: [id number] ?DOB: 10-25-1950 ?Height: 5' 6.5" (168.9 cm) ?Weight: 65.8 kg ? ?Insurance Information ?HMO:     PPO:      PCP:      IPA:      80/20: yes      OTHER: Medicare A/B      Policy#: [id number] Subscriber: pt ?CM Name:       Phone#:      Fax#:  ?Pre-Cert#: verified online      Employer:  ?Benefits:  Phone #:      Name:  ?Eff. Date: a and be effective 11/19/2020 Deduct: $1600      Out of Pocket Max: n/a      Life Max: n/a ?CIR: 100%      SNF: 20 full days ?Outpatient: 80%     Co-Ins: 20% ?Home Health: 100%      Co-Pay:  ?DME: 80%     Co-Ins: 20% ?Providers: pt choice  ?Providers: in network  ?SECONDARY: [employer]      Policy#: [id number]     Phone#:  ? ?Financial Counselor:       Phone#:  ? ?The ?Data Collection Information Summary? for patients in Inpatient Rehabilitation Facilities with attached ?Privacy Act [place] Records? was provided and verbally reviewed with: Patient ? ?Emergency Contact Information ?Contact Information   ? ? Name Relation Home Work Mobile  ? [place] Relative [phone]  [phone]  ? Elgie Congo Friend   [phone]  ? ?  ? ? ?Current Medical History  ?Patient Admitting Diagnosis: Multiple fractures ?History of Present Illness: Kaelyn C. Fellows is a 71 year old female with history of COPD, IBS, PAF s/p Maze, Breast cancer, mechanical MVR who was admitted on 01/01/22 after fall while transfering out of bed with onset of knee pain and inability to walk. She was found to have right tibial plateau fracture, tibial shaft fracture, right lateral malleolus Fx, left 5th MT fracture.  She was transitioned to IV heparin and underwent ORIF of right tibial and tibial shaft fracture on 03/20 by Dr. Marcelino Scot. Post op to be NWB RLE w/ROM of knee as tolerated and WBAT on Left foot with post op shoe for transfers only. ABLA stable and leucocytosis has resolved. Follow up BMET  showed evidence of pre-renal azotemia. She continues to be limited by weakness, anxiety, weight bearing restrictions affecting ADLs and mobility. CIR recommended due to functional decline.  ?  ?  ? ?Patient's medical record from [facility] has been reviewed by the rehabilitation admission coordinator and physician. ? ?Past Medical History  ?Past Medical History:  ?Diagnosis Date  ? Anxiety   ? Arthritis   ? knees  ? Breast cancer ([place]) 1999  ? CHF (congestive heart failure) ([place])   ? Related to severe mitral regurgitation, April, 2013  ? COPD (chronic obstructive pulmonary disease) ([place])   ? COPD with emphysema.. Assess by pulmonary team in the hospital April, 2013  ? Ejection fraction   ? EF 60%, echo, April, 2013, with severe MR before mitral valve replacement  ? Herpes   ? Hypothyroidism   ? IBS (irritable bowel syndrome)   ? Mitral valve regurgitation   ? Mitral valve replacement April, 2013, Mitral valve prolapse  ? Multiple sclerosis ([place])   ? Neurogenic bladder   ? Osteoporosis   ? Ovarian cyst   ? Paroxysmal atrial fibrillation ([place])   ? Rapid atrial fibrillation in-hospital,  Rapid cardioversion,  before mitral valve surgery  ? Pulmonary hypertension ([place])   ? Echo, April, 2013, before mitral valve surgery  ? S/P Maze operation for atrial fibrillation 01/26/2012  ? Complete biatrial lesion set using cryothermy via right mini thoracotomy  ? S/P mitral valve replacement 01/26/2012  ? 47m Sorin [employer] Optiform mechanical prosthesis via right mini thoracotomy  ? Warfarin anticoagulation   ? Mechanical mitral prosthesis, April, 20136  ? ? ?Has the patient had major surgery during 100 days prior to admission? Yes ? ?Family History   ?family history includes Breast cancer in her maternal aunt and maternal grandmother; CAD in her father, mother, and another family member; Heart disease in her father; Hyperlipidemia in her mother; Hypertension in her father and mother. ? ?Current  Medications ? ?Current Facility-Administered Medications:  ?  acetaminophen (TYLENOL) tablet 1,000 mg, 1,000 mg, Oral, Q6H PRN, PAinsley Spinner PA-C, 1,000 mg at 01/07/22 05852?  calcium carbonate (OS-CAL - dosed in mg of elemental calcium) tablet 1,250 mg, 1,250 mg, Oral, Q breakfast, PAinsley Spinner PA-C, 1,250 mg at 01/07/22 07782?  cholecalciferol (VITAMIN D3) tablet 2,000 Units, 2,000 Units, Oral, QAudree Camel PA-C, 2,000 Units at 01/06/22 04235?  cholecalciferol (VITAMIN D3) tablet 4,000 Units, 4,000 Units, Oral, QAudree Camel PA-C, 4,000 Units at 01/07/22 0945 ?  docusate sodium (COLACE) capsule 100 mg, 100 mg, Oral, Daily, JDomenic Polite MD ?  feeding supplement (ENSURE ENLIVE / ENSURE PLUS) liquid 237 mL, 237 mL, Oral, BID BM, PAinsley Spinner PA-C, 237 mL at 01/07/22 03614?  fluticasone (FLONASE) 50 MCG/ACT nasal spray 1 spray, 1 spray, Each Nare, Daily, PAinsley Spinner PA-C, 1 spray at 01/07/22 0950 ?  heparin ADULT infusion 100 units/mL (25000 units/2541m, 1,250 Units/hr, Intravenous, Continuous, BrLaren EvertsRPH, Last Rate: 12.5 mL/hr at 01/06/22 2235, 1,250 Units/hr at 01/06/22 2235 ?  HYDROcodone-acetaminophen (NORCO/VICODIN) 5-325 MG per tablet 1-2 tablet, 1-2 tablet, Oral, Q6H PRN, PaAinsley SpinnerPA-C ?  lip balm (CARMEX) ointment, , Topical, PRN, PaAinsley SpinnerPA-C ?  lisinopril (ZESTRIL) tablet 5 mg, 5 mg, Oral, Daily, PaAinsley SpinnerPA-C, 5 mg at 01/07/22 094315  magnesium oxide (MAG-OX) tablet 200 mg, 200 mg, Oral, Daily, PaAinsley SpinnerPA-C, 200 mg at 01/07/22 094008  metoprolol tartrate (LOPRESSOR) tablet 25 mg, 25 mg, Oral, BID, PaAinsley SpinnerPA-C, 25 mg at 01/07/22 096761  morphine (PF) 2 MG/ML injection 0.5 mg, 0.5 mg, Intravenous, Q2H PRN, PaAinsley SpinnerPA-C ?  ondansetron ([facility]Park[facility] Hospitalinjection 4 mg, 4 mg, Intravenous, Q6H PRN, JoDomenic PoliteMD ?  thyroid (ARMOUR) tablet 90 mg, 90 mg, Oral, Q0600, PaAinsley SpinnerPA-C, 90 mg at 01/07/22 0554 ?  warfarin (COUMADIN) tablet 7.5 mg, 7.5 mg, Oral,  ONCE-1600, JaKaleen MaskRPH ?  Warfarin - Pharmacist Dosing Inpatient, , Does not apply, q1600, JaKaleen MaskRPUnm Children'S Psychiatric CenterGiven at 01/06/22 1719 ? ?Patients Current Diet:  ?Diet Order   ? ?       ?  Diet regular Room service appropriate? Yes; Fluid consistency: Thin  Diet effective now       ?  ? ?  ?  ? ?  ? ? ?Precautions / Restrictions ?Precautions ?Precautions: Fall ?Other Brace: post op shoe ?Restrictions ?Weight Bearing Restrictions: Yes ?RLE Weight Bearing: Non weight bearing ?LLE Weight Bearing: Weight bearing as tolerated (in post op shoe, transfers only) ?Other Position/Activity Restrictions: Per notes, unrestricted ROM  ? ?Has the patient had 2 or more falls or a  fall with injury in the past year? Yes ? ?Prior Activity Level ?Community (5-7x/wk): Pt. was active in the community PTA ? ?Prior Functional Level ?Self Care: Did the patient need help bathing, dressing, using the toilet or eating? Independent ? ?Indoor Mobility: Did the patient need assistance with walking from room to room (with or without device)? Independent ? ?Stairs: Did the patient need assistance with internal or external stairs (with or without device)? Independent ? ?Functional Cognition: Did the patient need help planning regular tasks such as shopping or remembering to take medications? Independent ? ?Patient Information ?Are you of Hispanic, Latino/a,or Spanish origin?: A. No, not of Hispanic, Latino/a, or Spanish origin ?What is your race?: A. White ?Do you need or want an interpreter to communicate with a doctor or health care staff?: 0. No ? ?Patient's Response To:  ?Health Literacy and Transportation ?Is the patient able to respond to health literacy and transportation needs?: Yes ?Health Literacy - How often do you need to have someone help you when you read instructions, pamphlets, or other written material from your doctor or pharmacy?: Never ?In the past 12 months, has lack of transportation kept you from medical appointments  or from getting medications?: No ?In the past 12 months, has lack of transportation kept you from meetings, work, or from getting things needed for daily living?: No ? ?Home Assistive Devices / Equipment ?Home Assistive

## 2022-01-07 NOTE — Progress Notes (Signed)
Not able to tolerate bone foam- used smaller rolled up blanket under ankle ?

## 2022-01-07 NOTE — Progress Notes (Signed)
ANTICOAGULATION CONSULT NOTE - Follow Up Consult ? ?Pharmacy Consult for Heparin bridge to warfarin ?Indication:  mechanical mitral valve + AFib ? ?Allergies  ?Allergen Reactions  ? Erythromycin Nausea And Vomiting  ? Atorvastatin Other (See Comments)  ?  Pt reports causes body to be stiff, joints ache, aching in knees and ankles, feels more fatigued.   ? Lactose Intolerance (Gi) Other (See Comments)  ?  Upset stomach - yoghurt  ? Valsartan Other (See Comments)  ?  Pt reports caused her depression  ? ? ?Patient Measurements: ?Height: 5' 6.5" (168.9 cm) ?Weight: 65.8 kg (145 lb) ?IBW/kg (Calculated) : 60.45 ?Heparin Dosing Weight: 66 kg ? ?Vital Signs: ?Temp: 98.1 ?F (36.7 ?C) (03/22 0746) ?Temp Source: Oral (03/22 0746) ?BP: 133/54 (03/22 0746) ?Pulse Rate: 79 (03/22 0746) ? ?Labs: ?Recent Labs  ?  01/05/22 ?8546 01/06/22 ?0243 01/06/22 ?1133 01/06/22 ?2012 01/07/22 ?0717  ?HGB 10.8* 10.0*  --   --  10.3*  ?HCT 30.6* 29.0*  --   --  30.2*  ?PLT 220 222  --   --  251  ?LABPROT 14.1 14.6  --   --  16.9*  ?INR 1.1 1.1  --   --  1.4*  ?HEPARINUNFRC 0.33 0.18* 0.38 0.36 0.38  ?CREATININE 0.71 0.65  --   --  0.72  ? ? ? ?Estimated Creatinine Clearance: 60.7 mL/min (by C-G formula based on SCr of 0.72 mg/dL). ? ?Assessment: ?71 year old female with a mechanical mitral valve and AFib s/p ORIF on 01/05/22 for fracture of R-knee after fall. Pharmacy consulted to resume and manage heparin bridge to warfarin post-op. ? ?Heparin level 0.38, therapeutic ?INR 1.4, subtherapeutic ?H/H/Plt stable ?No s/sx of bleeding ?  ?PTA Warfarin regimen:  7.5 mg Sun/Tues/Thurs + 5 mg Mon/Wed/Fri/Sat ? ?Goal of Therapy:  ?Heparin level 0.3-0.7 units/ml ?INR 2.5-3.5 ?Monitor platelets by anticoagulation protocol: Yes ?  ?Plan:  ?Continue heparin infusion at therapeutic rate of 1250 units/hr  ?Warfarin 7.'5mg'$  po x1  ? ?Heparin level and PT/INR daily with AM labs ?CBC daily  ?Continue to monitor for s/sx of bleeding. ? ?Luisa Hart, PharmD,  BCPS ?Clinical Pharmacist ?01/07/2022 12:20 PM  ? ?Please refer to [facility] for pharmacy phone number  ?

## 2022-01-07 NOTE — Progress Notes (Signed)
Inpatient Rehab Admissions Coordinator:  ? ?I am following this Pt. For potential CIR admission and while patient is interested, she does not currently have a plan for 24/7 support when she returns home and she is not likely to be mod I following a brief stay on CIR. She will also need assistance getting up stairs into her home and assistance with bathing, dressing, as she is likely to d/c home at wheelchair level due to weight bearing restrictions. She states that she will call friends and family to see if anyone can come stay with her for 4-6 weeks following d/c from CIR, but would like to speak with TOC regarding potential d/c to SNF in the event that Pt. Is not able to find anyone who can come stay with her..  ? ?Clemens Catholic, MS, CCC-SLP ?Rehab Admissions Coordinator  ?[phone] (celll) ?[phone] (office) ? ?

## 2022-01-07 NOTE — Progress Notes (Signed)
Physical Therapy Treatment ?Patient Details ?Name: [name] [name] ?MRN: [id number] ?DOB: Feb 21, 1951 ?Today's Date: 01/07/2022 ? ? ?History of Present Illness Pt is a 71 yr old female who presented 3/16 due to a fall. A-ray showed  tibia plateau, tibia shaft, and lat malleolus fxs.S/p R ORIF on 3/20.  Pt also with  Acute minimally displaced fracture of the fifth metatarsal on the L.  PMH: PAF s/p maze, MVR a/p replacement valve, multiple strokes, HTN, HFpEF, breast ca, COPD, ? ?  ?PT Comments  ? ? Pt continuing to make slow progress towards goals this session requiring min assist to roll R/L x3 for peri-care, min guard to come to EOB. Pt max assist to for transfers, attempted x2 with moderate clearance of hips. Pt declining further attempts and requesting to return to supine for bed pan, min assist for RLE management. Pt able to doff post op shoe in figure 4 pattern sitting EOB. Pt continues to benefit from skilled PT services to progress toward functional mobility goals.  ?  ?Recommendations for follow up therapy are one component of a multi-disciplinary discharge planning process, led by the attending physician.  Recommendations may be updated based on patient status, additional functional criteria and insurance authorization. ? ?Follow Up Recommendations ? Acute inpatient rehab (3hours/day) ?  ?  ?Assistance Recommended at Discharge Frequent or constant Supervision/Assistance  ?Patient can return home with the following Two people to help with walking and/or transfers;Two people to help with bathing/dressing/bathroom;Help with stairs or ramp for entrance;Assist for transportation;Assistance with cooking/housework ?  ?Equipment Recommendations ? Wheelchair cushion (measurements PT);Wheelchair (measurements PT)  ?  ?Recommendations for Other Services   ? ? ?  ?Precautions / Restrictions Precautions ?Precautions: Fall ?Required Braces or Orthoses: Other Brace ?Other Brace: post op shoe ?Restrictions ?Weight Bearing  Restrictions: Yes ?RLE Weight Bearing: Non weight bearing ?LLE Weight Bearing: Weight bearing as tolerated (in post op shoe, transfers only) ?Other Position/Activity Restrictions: Per notes, unrestricted ROM  ?  ? ?Mobility ? Bed Mobility ?Overal bed mobility: Needs Assistance ?Bed Mobility: Supine to Sit, Sit to Supine, Rolling ?Rolling: Min assist ?  ?Supine to sit: Min guard ?Sit to supine: Min assist ?  ?General bed mobility comments: min asssit to roll R/L for pericare, min A to return to bed to assist RLE ?  ? ?Transfers ?Overall transfer level: Needs assistance ?Equipment used: Rolling walker (2 wheels) ?Transfers: Sit to/from Stand ?  ?  ?  ?  ?  ?  ?General transfer comment: Attempted to stand x2 from elevated bed height, able to moderatley clear hips with max assist.  able to scoot up towards [facility] with min guard A and substantially increased time. ?  ? ?Ambulation/Gait ?  ?  ?  ?  ?  ?  ?  ?  ? ? ?Stairs ?  ?  ?  ?  ?  ? ? ?Wheelchair Mobility ?  ? ?Modified Rankin (Stroke Patients Only) ?  ? ? ?  ?Balance Overall balance assessment: Needs assistance ?Sitting-balance support: Bilateral upper extremity supported, Single extremity supported, Feet supported ?Sitting balance-Leahy Scale: Fair ?  ?  ?  ?  ?  ?  ?  ?  ?  ?  ?  ?  ?  ?  ?  ?  ?  ? ?  ?Cognition Arousal/Alertness: Awake/alert ?Behavior During Therapy: [facility] for tasks assessed/performed ?Overall Cognitive Status: Within Functional Limits for tasks assessed ?  ?  ?  ?  ?  ?  ?  ?  ?  ?  ?  ?  ?  ?  ?  ?  ?  ?  ?  ? ?  ?  Exercises   ? ?  ?General Comments General comments (skin integrity, edema, etc.): pt benefitting from increased education/encouragement, stating "I can't" frequently during session, at times appears to be self-limiting ?  ?  ? ?Pertinent Vitals/Pain Pain Assessment ?Pain Assessment: Faces ?Faces Pain Scale: Hurts little more ?Pain Location: RLE ?Pain Descriptors / Indicators: Aching, Grimacing, Guarding ?Pain Intervention(s): Limited  activity within patient's tolerance, Monitored during session, Repositioned, Premedicated before session  ? ? ?Home Living   ?  ?  ?  ?  ?  ?  ?  ?  ?  ?   ?  ?Prior Function    ?  ?  ?   ? ?PT Goals (current goals can now be found in the care plan section) Acute Rehab PT Goals ?PT Goal Formulation: With patient ?Time For Goal Achievement: 01/20/22 ? ?  ?Frequency ? ? ? Min 5X/week ? ? ? ?  ?PT Plan    ? ? ?Co-evaluation   ?  ?  ?  ?  ? ?  ?AM-PAC PT "6 Clicks" Mobility   ?Outcome Measure ? Help needed turning from your back to your side while in a flat bed without using bedrails?: A Little ?Help needed moving from lying on your back to sitting on the side of a flat bed without using bedrails?: A Little ?Help needed moving to and from a bed to a chair (including a wheelchair)?: Total ?Help needed standing up from a chair using your arms (e.g., wheelchair or bedside chair)?: Total ?Help needed to walk in hospital room?: Total ?Help needed climbing 3-5 steps with a railing? : Total ?6 Click Score: 10 ? ?  ?End of Session Equipment Utilized During Treatment: Gait belt ?Activity Tolerance: Patient tolerated treatment well ?Patient left: in bed;with call bell/phone within reach;with bed alarm set ?Nurse Communication: Mobility status ?PT Visit Diagnosis: Other abnormalities of gait and mobility (R26.89);Muscle weakness (generalized) (M62.81);Difficulty in walking, not elsewhere classified (R26.2) ?  ? ? ?Time: 1062-6948 ?PT Time Calculation (min) (ACUTE ONLY): 23 min ? ?Charges:  $Therapeutic Activity: 23-37 mins          ?          ? ?Audry Riles. PTA ?Acute Rehabilitation Services ?Office: [phone] ? ? ? ?Betsey Holiday Edrick Whitehorn ?01/07/2022, 1:58 PM ? ?

## 2022-01-07 NOTE — Progress Notes (Signed)
? ?                              Orthopaedic Trauma Service Progress Note ? ?Patient ID: ?[name] [name] ?MRN: [id number] ?DOB/AGE: 1950/10/22 71 y.o. ? ?Subjective: ? ?No acute ortho issues ?Hopeful for CIR  ? ? ?ROS ?As above ? ?Objective:  ? ?VITALS:   ?Vitals:  ? 01/06/22 0845 01/06/22 1933 01/07/22 0422 01/07/22 0746  ?BP: 127/67 (!) 128/48 134/61 (!) 133/54  ?Pulse: 84 93 85 79  ?Resp: '18  18 17  '$ ?Temp: 98.3 ?F (36.8 ?C) 98.1 ?F (36.7 ?C) 98.2 ?F (36.8 ?C) 98.1 ?F (36.7 ?C)  ?TempSrc:  Oral Oral Oral  ?SpO2: 98% 99% 100% 98%  ?Weight:      ?Height:      ? ? ?Estimated body mass index is 23.05 kg/m? as calculated from the following: ?  Height as of this encounter: 5' 6.5" (1.689 m). ?  Weight as of this encounter: 65.8 kg. ? ? ?Intake/Output   ?   03/21 0701 ?03/22 0700 03/22 0701 ?03/23 0700  ? I.V. (mL/kg)    ? IV Piggyback    ? Total Intake(mL/kg)    ? Urine (mL/kg/hr)    ? Blood    ? Total Output    ? Net    ?     ? Urine Occurrence 1 x   ?  ? ?LABS ? ?Results for orders placed or performed during the hospital encounter of 01/01/22 (from the past 24 hour(s))  ?Heparin level (unfractionated)     Status: None  ? Collection Time: 01/06/22 11:33 AM  ?Result Value Ref Range  ? Heparin Unfractionated 0.38 0.30 - 0.70 IU/mL  ?Heparin level (unfractionated)     Status: None  ? Collection Time: 01/06/22  8:12 PM  ?Result Value Ref Range  ? Heparin Unfractionated 0.36 0.30 - 0.70 IU/mL  ?CBC     Status: Abnormal  ? Collection Time: 01/07/22  7:17 AM  ?Result Value Ref Range  ? WBC 8.9 4.0 - 10.5 K/uL  ? RBC 3.12 (L) 3.87 - 5.11 MIL/uL  ? Hemoglobin 10.3 (L) 12.0 - 15.0 g/dL  ? HCT 30.2 (L) 36.0 - 46.0 %  ? MCV 96.8 80.0 - 100.0 fL  ? MCH 33.0 26.0 - 34.0 pg  ? MCHC 34.1 30.0 - 36.0 g/dL  ? RDW 13.9 11.5 - 15.5 %  ? Platelets 251 150 - 400 K/uL  ? nRBC 0.0 0.0 - 0.2 %  ?Protime-INR     Status: Abnormal  ? Collection Time: 01/07/22  7:17 AM  ?Result Value Ref Range  ?  Prothrombin Time 16.9 (H) 11.4 - 15.2 seconds  ? INR 1.4 (H) 0.8 - 1.2  ?Basic metabolic panel     Status: Abnormal  ? Collection Time: 01/07/22  7:17 AM  ?Result Value Ref Range  ? Sodium 136 135 - 145 mmol/L  ? Potassium 4.1 3.5 - 5.1 mmol/L  ? Chloride 100 98 - 111 mmol/L  ? CO2 27 22 - 32 mmol/L  ? Glucose, Bld 103 (H) 70 - 99 mg/dL  ? BUN 28 (H) 8 - 23 mg/dL  ? Creatinine, Ser 0.72 0.44 - 1.00 mg/dL  ? Calcium 9.1 8.9 - 10.3 mg/dL  ? GFR, Estimated >60 >60 mL/min  ? Anion gap 9 5 - 15  ?Heparin level (unfractionated)     Status: None  ? Collection Time: 01/07/22  7:17  AM  ?Result Value Ref Range  ? Heparin Unfractionated 0.38 0.30 - 0.70 IU/mL  ? ?*Note: Due to a large number of results and/or encounters for the requested time period, some results have not been displayed. A complete set of results can be found in Results Review.  ? ? ? ?PHYSICAL EXAM:  ? ?Gen: sitting up in bed, pleasant ?Lungs: unlabored ?Ext:  ?     Right Lower Extremity  ?            Dressing clean, dry and intact ?            Minimal swelling ?            Ext warm  ?            Compartments are soft  ?            No pain with passive stretching  ?            Motor and sensory functions intact ?            knee straighter than yesterday  ?  I did place her in bone foam today  ?  ?     Left Lower extremity  ?            No change in left foot exam ?            Ecchymosis stable ?            No further increase in pain  ?  ? ?Assessment/Plan: ?2 Days Post-Op  ? ? ?Anti-infectives (From admission, onward)  ? ? Start     Dose/Rate Route Frequency Ordered Stop  ? 01/05/22 1200  ceFAZolin (ANCEF) IVPB 1 g/50 mL premix       ? 1 g ?100 mL/hr over 30 Minutes Intravenous Every 6 hours 01/05/22 1110 01/05/22 2341  ? 01/05/22 0730  ceFAZolin (ANCEF) IVPB 2g/100 mL premix       ? 2 g ?200 mL/hr over 30 Minutes Intravenous On call to O.R. 01/04/22 1201 01/05/22 0835  ? 01/05/22 0600  ceFAZolin (ANCEF) IVPB 2g/100 mL premix  Status:  Discontinued       ?  2 g ?200 mL/hr over 30 Minutes Intravenous On call to O.R. 01/05/22 0205 01/05/22 0227  ? ?  ?. ? ?POD/HD#: 2 ? ?  ?71 y/o female s/p fall with R tibial plateau, R distal fibula fracture, L 5th metatarsal fracture. History of mitral valve replacement, chronic anticoagulation, MS, osteoporosis, thyroid disease, CHF  ?  ?-fall  ?  ?- R tibial plateau and shaft fracture s/p ORIF  ?            NWB R leg x 6-8 weeks ?            Unrestricted ROM R knee  ?            PT/OT ?            Ice and elevate for swelling and pain control  ?            CIR consult ?            TOC consult for SNF  ? Dressing change tomorrow  ?  ?            FLOAT HEELS OFF BED- pt with pressure sore to R heel  ?  ?PT- please teach HEP for R knee ROM- AROM, PROM. No ROM restrictions.  Quad sets, SLR, LAQ, SAQ, heel slides, stretching ?  ?Ankle theraband program, heel cord stretching, toe towel curls, etc ?  ?No pillows under bend of knee when at rest, ok to place under heel to help work on extension. Can also use zero knee bone foam if available ?  ?-R lateral malleolus fracture s/p screw fixation  ?            NWB as above ?            No bracing  ?            No ROM restrictions  ?  ?- L 5th MTT fracture ?            Non-op ?            WBAT with post op shoe for transfers only  ?            Shoe only needs to be on when transferring   ?  ?- Pain management: ?            Multimodal ?            Doing well on tylenol only  ?  ?- ABL anemia/Hemodynamics ?            Stable  ?            Monitor ?  ?- Medical issues  ?            Per primary  ?  ?- DVT/PE prophylaxis: ?            Heparin bridge to coumadin  ?  ?- ID:  ?            Periop abx ?  ?- Metabolic Bone Disease: ?            Vitamin d looks good ?            Known osteoporosis  ?            Confirmed clinically in OR, bone very soft  ?            Time to consider pharmacologic treatment for osteoporosis  ?            Will discuss with pt in outpt setting if she would like to discuss this  with her PCP or if she would like to go to a provider that specializes in osteoporosis  ?            DEXA 2022 done at [facility] shows t score of -3.4 at R femoral neck  ?  ?- Activity: ?            As above ?  ?- Impediments to fracture healing: ?            Osteoporosis  ?             ?- Dispo: ?            continue with therapies ?            CIR consult  ?            TOC for SNF as backup  ? ? ?Jari Pigg, PA-C ?[phone] (C) ?01/07/2022, 10:21 AM ? ?[facility] ?[place] [address] ?[phone] Jenetta Downer) ?[phone] (F) ? ? ? ?After 5pm and on the weekends please log on to Amion, go to orthopaedics and the look under the Sports Medicine  Group Call for the provider(s) on call. You can also call our office at [phone] and then follow the prompts to be connected to the call team.  ? Patient ID: [name] [name], female   DOB: 10-10-1951, 71 y.o.   MRN: [id number] ? ?

## 2022-01-07 NOTE — H&P (Incomplete)
? ? ?Physical Medicine and Rehabilitation Admission H&P ? ?  ?Chief Complaint  ?Patient presents with  ? Functional deficits.   ? Knee Pain  ? ? ?HPI:  [name] [name] is a 71 year old female with history of COPD, IBS, PAF s/p Maze, Breast cancer, mechanical MVR who was admitted on 01/01/22 after fall while transfering out of bed with onset of knee pain and inability to walk. She was found to have right tibial plateau fracture, tibial shaft fracture, right lateral malleolus Fx, left 5th MT fracture.  She was transitioned to IV heparin and underwent ORIF of right tibial and tibial shaft fracture on 03/20 by Dr. Marcelino Scot. Post op to be NWB RLE w/ROM of knee as tolerated and WBAT on Left foot with post op shoe for transfers only. ABLA stable and leucocytosis has resolved. Follow up BMET showed evidence of pre-renal azotemia.  ? ?She continues to be limited by weakness, anxiety, weight bearing restrictions affecting ADLs and mobility. CIR recommended due to functional decline.  ? ? ?ROS ? ? ?Past Medical History:  ?Diagnosis Date  ? Anxiety   ? Arthritis   ? knees  ? Breast cancer ([place]) 1999  ? CHF (congestive heart failure) ([place])   ? Related to severe mitral regurgitation, April, 2013  ? COPD (chronic obstructive pulmonary disease) ([place])   ? COPD with emphysema.. Assess by pulmonary team in the hospital April, 2013  ? Ejection fraction   ? EF 60%, echo, April, 2013, with severe MR before mitral valve replacement  ? Herpes   ? Hypothyroidism   ? IBS (irritable bowel syndrome)   ? Mitral valve regurgitation   ? Mitral valve replacement April, 2013, Mitral valve prolapse  ? Multiple sclerosis ([place])   ? Neurogenic bladder   ? Osteoporosis   ? Ovarian cyst   ? Paroxysmal atrial fibrillation ([place])   ? Rapid atrial fibrillation in-hospital, Rapid cardioversion,  before mitral valve surgery  ? Pulmonary hypertension ([place])   ? Echo, April, 2013, before mitral valve surgery  ? S/P Maze operation for atrial fibrillation 01/26/2012   ? Complete biatrial lesion set using cryothermy via right mini thoracotomy  ? S/P mitral valve replacement 01/26/2012  ? 68m Sorin [employer] Optiform mechanical prosthesis via right mini thoracotomy  ? Warfarin anticoagulation   ? Mechanical mitral prosthesis, April, 20136  ? ? ?Past Surgical History:  ?Procedure Laterality Date  ? BREAST LUMPECTOMY Right 1999  ? with sent.node, and axillary dissection (20)  ? CHEST TUBE INSERTION  01/26/2012  ? Procedure: CHEST TUBE INSERTION;  Surgeon: CRexene Alberts MD;  [facility]  Service: Open Heart Surgery;  Laterality: Left;  ? COLONOSCOPY  2010  ? "normal"  ? CYSTOSCOPY  1992  ? LAPAROSCOPIC OVARIAN CYSTECTOMY  1978  ? urethral stricture repair  ? LEFT AND RIGHT HEART CATHETERIZATION WITH CORONARY ANGIOGRAM N/A 01/20/2012  ? Procedure: LEFT AND RIGHT HEART CATHETERIZATION WITH CORONARY ANGIOGRAM;  Surgeon: CBurnell Blanks MD;  [facility];  Service: Cardiovascular;  Laterality: N/A;  ? LYMPHADENECTOMY    ? MAZE  01/26/2012  ? Procedure: MAZE;  Surgeon: CRexene Alberts MD;  [facility]  Service: Open Heart Surgery;  Laterality: N/A;  ? MITRAL VALVE REPLACEMENT  01/26/2012  ? Procedure: MINIMALLY INVASIVE MITRAL VALVE (MV) REPLACEMENT;  Surgeon: CRexene Alberts MD;  [facility]  Service: Open Heart Surgery;  Laterality: Right;  ? TEE WITHOUT CARDIOVERSION  01/19/2012  ? Procedure: TRANSESOPHAGEAL ECHOCARDIOGRAM (  TEE);  Surgeon: Peter M Martinique, MD;  Location[facility] ENDOSCOPY;  Service: Cardiovascular;  Laterality: N/A;  ? TONSILLECTOMY  1970  ? [place]  ? WRIST SURGERY Right 2012  ? ? ?Family History  ?Problem Relation Age of Onset  ? Heart disease Father   ?     cardiac arrest   ? CAD Father   ? Hypertension Father   ? CAD Mother   ?     5 stents and numerous bypass surgery  ? Hypertension Mother   ? Hyperlipidemia Mother   ? CAD Other   ? Breast cancer Maternal Grandmother   ? Breast cancer Maternal Aunt   ? ? ?Social History:   reports that she has never smoked. She has never used smokeless tobacco. She reports that she does not drink alcohol and does not use drugs. ? ? ?Allergies  ?Allergen Reactions  ? Erythromycin Nausea And Vomiting  ? Atorvastatin Other (See Comments)  ?  Pt reports causes body to be stiff, joints ache, aching in knees and ankles, feels more fatigued.   ? Lactose Intolerance (Gi) Other (See Comments)  ?  Upset stomach - yoghurt  ? Valsartan Other (See Comments)  ?  Pt reports caused her depression  ? ? ?Medications Prior to Admission  ?Medication Sig Dispense Refill  ? acetaminophen (TYLENOL) 500 MG tablet Take 1,000 mg by mouth every 6 (six) hours as needed for moderate pain.    ? ARMOUR THYROID 90 MG tablet Take 1 tablet (90 mg total) by mouth daily. 30 tablet 0  ? Biotin 5000 MCG CAPS Take 10,000 mcg by mouth in the morning and at bedtime.    ? Black Cohosh 40 MG CAPS Take 40 mg by mouth daily.    ? Bromelains (BROMELAIN PO) Take 1 capsule by mouth 2 (two) times daily.    ? calcium carbonate (OSCAL) 1500 (600 Ca) MG TABS tablet Take 1,500 mg by mouth daily.    ? Cholecalciferol (VITAMIN D3) 2000 units capsule Take 2,000-4,000 Units by mouth See admin instructions. Takes 2000 IU one day and 4000 IU next day .    ? Coenzyme Q10 (CO Q-10) 100 MG CAPS Take 100 mg by mouth daily.    ? Cranberry 500 MG CAPS Take 500 mg by mouth 2 (two) times daily.    ? fluticasone (FLONASE) 50 MCG/ACT nasal spray Place 1 spray into both nostrils daily. 16 g 6  ? L-THEANINE PO Take 1 capsule by mouth every evening.    ? lactose free nutrition (BOOST PLUS) LIQD Take 237 mLs by mouth 2 (two) times daily.    ? lidocaine-prilocaine (EMLA) cream Apply 1 application. topically daily as needed (per pt this is for use before blood draws).  0  ? lisinopril (ZESTRIL) 5 MG tablet Take 1 tablet (5 mg total) by mouth daily. 90 tablet 1  ? Lysine 500 MG CAPS Take 1,000 mg by mouth 2 (two) times daily.    ? Magnesium Citrate 200 MG TABS Take 100 mg by  mouth daily.    ? meclizine (ANTIVERT) 12.5 MG tablet Take 1 tablet (12.5 mg total) by mouth 2 (two) times daily as needed for dizziness. 30 tablet 0  ? metoprolol tartrate (LOPRESSOR) 25 MG tablet TAKE 1 TAB (25 MG) PO TWICE DAILY - pt must make appt with provider for further refills - 1st attempt (Patient taking differently: Take 25 mg by mouth 2 (two) times daily.) 180 tablet 3  ?  MILK THISTLE PO Take 1 capsule by mouth daily.    ? Misc Natural Products (GLUCOSAMINE CHOND COMPLEX/MSM PO) Take 1 tablet by mouth in the morning and at bedtime. Strength of dose Glucosamine '1500mg'$  /Chodroitron '1000mg'$ / MSM '500mg'$  takes one twice daily    ? Olive Leaf 500 MG CAPS Take 1 capsule by mouth 2 (two) times daily.     ? Omega 3 1200 MG CAPS Take 1,200 mg by mouth 2 (two) times daily.    ? OVER THE COUNTER MEDICATION Take 1 capsule by mouth 2 (two) times daily. Col[place]aid    ? Petasin (PETADOLEX PO) Take 1 tablet by mouth 2 (two) times daily.    ? Probiotic Product (PROBIOTIC DAILY PO) Take 1 capsule by mouth daily.    ? PROGESTERONE MICRONIZED PO Take 100 mg by mouth daily.     ? Pumpkin Seed 500 MG TABS Take 500 mg by mouth 2 (two) times daily.    ? TURMERIC PO Take 500 mg by mouth every evening.    ? vitamin A 10000 UNIT capsule Take 10,000 Units by mouth every evening.    ? vitamin B-12 (CYANOCOBALAMIN) 1000 MCG tablet Take 1,000 mcg by mouth daily. Taking sublingal    ? vitamin E 400 UNIT capsule Take 400 Units by mouth every evening.    ? warfarin (COUMADIN) 5 MG tablet Take 1 tablet by mouth daily except 1.5 tablets on Sundays, Tuesdays, and Thursdays or as directed Anticoagulation Clinic. (Patient taking differently: Take 5-7.5 mg by mouth See admin instructions. Take 1 tablet by mouth daily except 1.5 tablets on Sundays, Tuesdays, and Thursdays or as directed Anticoagulation Clinic.) 120 tablet 1  ? ? ? ? ?Home: ?Home Living ?Family/patient expects to be discharged to:: Private residence ?Living Arrangements:  Alone ?Available Help at Discharge: Friend(s), Neighbor ?Type of Home: House ?Home Access: Stairs to enter ?Entrance Stairs-Number of Steps: 4 ?Entrance Stairs-Rails: Can reach both ?Home Layout: Two level ?Al

## 2022-01-07 NOTE — [place] FL2 (Signed)
?[place] MEDICAID FL2 LEVEL OF CARE SCREENING TOOL  ?  ? ?IDENTIFICATION  ?Patient Name: ?[name] [name] Birthdate: 1950-11-27 Sex: female Admission Date (Current Location): ?01/01/2022  ?[place] and [place] Number: ? [place] ?  Facility and Address:  ?[facility] [place], [place] [address] [place], [place] [address] ?     Provider Number: ?[id number]  ?Attending Physician Name and Address:  ?Arrien, Jimmy Picket,* ? Relative Name and Phone Number:  ?[place] Relative [phone] ?   ?Current Level of Care: ?Hospital Recommended Level of Care: ?[place] Prior Approval Number: ?  ? ?Date Approved/Denied: ?  PASRR Number: ?[id number] A ? ?Discharge Plan: ?SNF ?  ? ?Current Diagnoses: ?Patient Active Problem List  ? Diagnosis Date Noted  ? Malnutrition of moderate degree 01/03/2022  ? Normocytic anemia 01/02/2022  ? Tibial plateau fracture, right 01/01/2022  ? Vertigo   ? Elevated BUN   ? Prolonged Q-T interval on ECG   ? Chronic obstructive pulmonary disease (HCC)   ? Premature ventricular contractions   ? Slow transit constipation   ? PAF (paroxysmal atrial fibrillation) ([place])   ? Torticollis   ? Essential hypertension   ? Acute lower UTI   ? Brainstem infarct, acute ([place]) 01/13/2021  ? 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 CVA (cerebral vascular accident) ([place]) 01/11/2021  ? Right sided weakness   ? PVCs (premature ventricular contractions) 03/30/2020  ? Osteoporosis 07/05/2019  ? Nonischemic cardiomyopathy ([place]) 07/20/2018  ? Scoliosis 11/20/2016  ? Long term current use of anticoagulant 09/18/2016  ? Compression fracture of lumbar spine, non-traumatic, sequela 08/11/2016  ? At high risk for injury related to fall 08/11/2016  ? Encounter for therapeutic drug monitoring 11/27/2013  ? Hypothyroidism   ? CHF (congestive heart failure) ([place])   ? Paroxysmal atrial fibrillation (HCC)   ? Arthritis   ? Neurogenic bladder   ? Warfarin anticoagulation   ? First  degree heart block 01/29/2012  ? S/P mitral valve replacement 01/26/2012  ? S/P Maze operation for atrial fibrillation 01/26/2012  ? Anxiety 01/18/2012  ? Multiple sclerosis ([place]) 01/19/2007  ? ? ?Orientation RESPIRATION BLADDER Height & Weight   ?  ?Self, Time, Situation, Place ? Normal Continent, External catheter Weight: 145 lb (65.8 kg) ?Height:  5' 6.5" (168.9 cm)  ?BEHAVIORAL SYMPTOMS/MOOD NEUROLOGICAL BOWEL NUTRITION STATUS  ?    Continent Diet (see discharge summary)  ?AMBULATORY STATUS COMMUNICATION OF NEEDS Skin   ?Total Care Verbally Surgical wounds ?  ?  ?  ?    ?     ?     ? ? ?Personal Care Assistance Level of Assistance  ?Bathing, Feeding, Dressing Bathing Assistance: Maximum assistance ?Feeding assistance: Independent ?Dressing Assistance: Maximum assistance ?   ? ?Functional Limitations Info  ?Sight, Hearing, Speech Sight Info: Adequate ?Hearing Info: Adequate ?Speech Info: Adequate  ? ? ?SPECIAL CARE FACTORS FREQUENCY  ?PT (By licensed PT), OT (By licensed OT)   ?  ?PT Frequency: 5x week ?OT Frequency: 5x week ?  ?  ?  ?   ? ? ?Contractures Contractures Info: Not present  ? ? ?Additional Factors Info  ?Code Status, Allergies Code Status Info: full ?Allergies Info: Erythromycin, Atorvastatin, Lactose Intolerance (Gi), Valsartan ?  ?  ?  ?   ? ?Current Medications (01/07/2022):  This is the current hospital active medication list ?Current Facility-Administered Medications  ?Medication Dose Route Frequency Provider Last Rate Last Admin  ? acetaminophen (TYLENOL) tablet 1,000 mg  1,000  mg Oral Q6H PRN Ainsley Spinner, PA-C   1,000 mg at 01/07/22 1610  ? calcium carbonate (OS-CAL - dosed in mg of elemental calcium) tablet 1,250 mg  1,250 mg Oral Q breakfast Ainsley Spinner, PA-C   1,250 mg at 01/07/22 9604  ? cholecalciferol (VITAMIN D3) tablet 2,000 Units  2,000 Units Oral Audree Camel, PA-C   2,000 Units at 01/06/22 5409  ? cholecalciferol (VITAMIN D3) tablet 4,000 Units  4,000 Units Oral Audree Camel, PA-C   4,000 Units at 01/07/22 0945  ? docusate sodium (COLACE) capsule 100 mg  100 mg Oral Daily Domenic Polite, MD      ? feeding supplement (ENSURE ENLIVE / ENSURE PLUS) liquid 237 mL  237 mL Oral BID BM Ainsley Spinner, PA-C   237 mL at 01/07/22 1512  ? fluticasone (FLONASE) 50 MCG/ACT nasal spray 1 spray  1 spray Each Nare Daily Ainsley Spinner, PA-C   1 spray at 01/07/22 0950  ? heparin ADULT infusion 100 units/mL (25000 units/260m)  1,250 Units/hr Intravenous Continuous BLaren Everts RPH 12.5 mL/hr at 01/06/22 2235 1,250 Units/hr at 01/06/22 2235  ? HYDROcodone-acetaminophen (NORCO/VICODIN) 5-325 MG per tablet 1-2 tablet  1-2 tablet Oral Q6H PRN PAinsley Spinner PA-C      ? lip balm (CARMEX) ointment   Topical PRN PAinsley Spinner PA-C      ? lisinopril (ZESTRIL) tablet 5 mg  5 mg Oral Daily PAinsley Spinner PA-C   5 mg at 01/07/22 08119 ? magnesium oxide (MAG-OX) tablet 200 mg  200 mg Oral Daily PAinsley Spinner PA-C   200 mg at 01/07/22 01478 ? metoprolol tartrate (LOPRESSOR) tablet 25 mg  25 mg Oral BID PAinsley Spinner PA-C   25 mg at 01/07/22 02956 ? morphine (PF) 2 MG/ML injection 0.5 mg  0.5 mg Intravenous Q2H PRN PAinsley Spinner PA-C      ? ondansetron ([facility] injection 4 mg  4 mg Intravenous Q6H PRN JDomenic Polite MD      ? thyroid (ARMOUR) tablet 90 mg  90 mg Oral Q0600 PAinsley Spinner PA-C   90 mg at 01/07/22 0554  ? warfarin (COUMADIN) tablet 7.5 mg  7.5 mg Oral ONCE-1600 JKaleen Mask RPH      ? Warfarin - Pharmacist Dosing Inpatient   Does not apply qEtnaJKaleen MaskRPost [facility]  Given at 01/06/22 1719  ? ? ? ?Discharge Medications: ?Please see discharge summary for a list of discharge medications. ? ?Relevant Imaging Results: ? ?Relevant Lab Results: ? ? ?Additional Information ?SSN: [id number]  Pt is vaccinated for covid with one booster. ? ?WJoanne Chars LCSW ? ? ? ? ?

## 2022-01-07 NOTE — TOC Initial Note (Signed)
Transition of Care (TOC) - Initial/Assessment Note  ? ? ?Patient Details  ?Name: [name] [name] ?MRN: [id number] ?Date of Birth: 10-01-1951 ? ?Transition of Care [facility]) CM/SW Contact:    ?Joanne Chars, LCSW ?Phone Number: ?01/07/2022, 3:55 PM ? ?Clinical Narrative:   CSW notified by Mickel Baas at [facility] that pt may not be candidate for their program, would like to discuss SNF.  CSW met with pt who does want information about SNF options, choice document given, permission given to send out referral in hub.  Permission given to speak with daughter in law [place], and friend Engineer, technical sales.  Pt lives alone, no current services.  Pt is vaccinated for covid with one booster. ? ?CSW later informed by Iris with TOC that April Manson is calling requesting infor regarding pt.  [phone].  CSW spoke with pt and this is her brother, permission given for CSW to call Jerline Pain.  CSW did speak with the brother, who is in [place] state.  He has concerns that pt has a very cluttered house which probably contributed to the fall that led to this admit.  In his opinion, pt could use assisted living type setting but she has been resistant to go and finances are also a barrier.  He asked about pt going to rehab and the possible time frame for that.  He is in contact with [facility] and they may try to speak to pt or at least make some improvements to her home situation while she is in rehab if she will let them.                 ? ? ?Expected Discharge Plan: [place] ?Barriers to Discharge: Continued Medical Work up, SNF Pending bed offer ? ? ?Patient Goals and CMS Choice ?Patient states their goals for this hospitalization and ongoing recovery are:: get back home ?CMS Medicare.gov Compare Post Acute Care list provided to:: Patient ?Choice offered to / list presented to : Patient ? ?Expected Discharge Plan and Services ?Expected Discharge Plan: [place] ?In-house Referral: Clinical Social Work ?  ?Post Acute Care Choice: IP  Rehab ?Living arrangements for the past 2 months: [place] ?                ?  ?  ?  ?  ?  ?  ?  ?  ?  ?  ? ?Prior Living Arrangements/Services ?Living arrangements for the past 2 months: [place] ?Lives with:: Self ?Patient language and need for interpreter reviewed:: Yes ?Do you feel safe going back to the place where you live?: Yes      ?Need for Family Participation in Patient Care: Yes (Comment) ?Care giver support system in place?: Yes (comment) ?Current home services: Other (comment) (none) ?Criminal Activity/Legal Involvement Pertinent to Current Situation/Hospitalization: No - Comment as needed ? ?Activities of Daily Living ?Home Assistive Devices/Equipment: [place] Rile (specify type), Eyeglasses (rolling walker, rollator) ?ADL Screening (condition at time of admission) ?Patient's cognitive ability adequate to safely complete daily activities?: No ?Is the patient deaf or have difficulty hearing?: No ?Does the patient have difficulty seeing, even when wearing glasses/contacts?: No ?Does the patient have difficulty concentrating, remembering, or making decisions?: No ?Patient able to express need for assistance with ADLs?: Yes ?Does the patient have difficulty dressing or bathing?: No ?Independently performs ADLs?: Yes (appropriate for developmental age) ?Does the patient have difficulty walking or climbing stairs?: Yes ?Weakness of Legs: Both ?Weakness of Arms/Hands: None ? ?Permission  Sought/Granted ?Permission sought to share information with : Family Supports ?Permission granted to share information with : Yes, Verbal Permission Granted ? Share Information with NAME: daughter in law [place], friend Rush Landmark, brother Jerline Pain. ? Permission granted to share info w AGENCY: SNF ?   ?   ? ?Emotional Assessment ?Appearance:: Appears stated age ?Attitude/Demeanor/Rapport: Engaged ?Affect (typically observed): Appropriate, Pleasant ?Orientation: : Oriented to Self, Oriented to Place, Oriented to  Time,  Oriented to Situation ?Alcohol / Substance Use: Not Applicable ?Psych Involvement: No (comment) ? ?Admission diagnosis:  Tibial plateau fracture, right [S82.141A] ?Closed fracture of right tibial plateau, initial encounter [S82.141A] ?Other closed fracture of proximal end of right tibia, initial encounter [S82.191A] ?Patient Active Problem List  ? Diagnosis Date Noted  ? Malnutrition of moderate degree 01/03/2022  ? Normocytic anemia 01/02/2022  ? Tibial plateau fracture, right 01/01/2022  ? Vertigo   ? Elevated BUN   ? Prolonged Q-T interval on ECG   ? Chronic obstructive pulmonary disease (HCC)   ? Premature ventricular contractions   ? Slow transit constipation   ? PAF (paroxysmal atrial fibrillation) ([place])   ? Torticollis   ? Essential hypertension   ? Acute lower UTI   ? Brainstem infarct, acute ([place]) 01/13/2021  ? Left pontine cerebrovascular accident [facility]) 01/13/2021  ? CVA (cerebral vascular accident) ([place]) 01/11/2021  ? Right sided weakness   ? PVCs (premature ventricular contractions) 03/30/2020  ? Osteoporosis 07/05/2019  ? Nonischemic cardiomyopathy ([place]) 07/20/2018  ? Scoliosis 11/20/2016  ? Long term current use of anticoagulant 09/18/2016  ? Compression fracture of lumbar spine, non-traumatic, sequela 08/11/2016  ? At high risk for injury related to fall 08/11/2016  ? Encounter for therapeutic drug monitoring 11/27/2013  ? Hypothyroidism   ? CHF (congestive heart failure) ([place])   ? Paroxysmal atrial fibrillation (HCC)   ? Arthritis   ? Neurogenic bladder   ? Warfarin anticoagulation   ? First degree heart block 01/29/2012  ? S/P mitral valve replacement 01/26/2012  ? S/P Maze operation for atrial fibrillation 01/26/2012  ? Anxiety 01/18/2012  ? Multiple sclerosis ([place]) 01/19/2007  ? ?PCP:  Howard Pouch A, DO ?Pharmacy:   ?[id number] [address], [address] AT [facility] OF [address] & [address] ?[place] ?[place] [address] ?Phone: [phone] Fax:  [phone] ? ?[id number] [address], [address] AT [facility] OF [address] & [address] ?[place] ?[place] [address] ?Phone: [phone] Fax: [phone] ? ? ? ? ?Social Determinants of Health (SDOH) Interventions ?  ? ?Readmission Risk Interventions ?   ? View : No data to display.  ?  ?  ?  ? ? ? ?

## 2022-01-07 NOTE — Assessment & Plan Note (Signed)
Continue with nutritional supplements.  

## 2022-01-07 NOTE — Hospital Course (Addendum)
[name] [name] was admitted to the hospital with the working diagnosis of right tibial plateau fracture, now SP open reduction and internal fixation.  ? ?Patient is a 71 year old female with history of paroxysmal A-fib status post Maze procedure, mitral valve replacement with mechanical valve, on Coumadin, history of CVA, chronic diastolic CHF, hypertension who presented from home after a fall followed by right knee pain. She was not able to ambulate due to pain. On her initial physical examination her blood pressure was 156/68, HR 89, RR 15 and 02 saturation 99% on room air. Her lungs were clear to auscultation, heart with S1 and S2 present and rhythmic, abdomen soft, right leg with tender edema at the level of the knee.  ? ?Na 140, K 3.5 Cl 102. Bicarbonate 29, glucose 125. Bun 22 cr 0,73  ?Wbc 10,2 hgb 12,6 hct 36,8 plt 200  ?INR 2.9  ?Sars covid 19 negative  ? ?CT right lower extremity with non displaced intra-articular fracture anteriorly in the mid lower leg, involving both tibial plateaus. Distal extension of the fracture into the metadiaphysis.  ? ?Coumadin held, started on IV heparin, ?Orthopedics were consulted  ?Underwent ORIF 3/20, heparin/coumadin resumed postop. ? ?IV heparin was continued until therapeutic INR was achieved. ?Plan to continue physical therapy and close follow up on INR per protocol.  ?

## 2022-01-08 DIAGNOSIS — S82141D Displaced bicondylar fracture of right tibia, subsequent encounter for closed fracture with routine healing: Secondary | ICD-10-CM | POA: Diagnosis not present

## 2022-01-08 DIAGNOSIS — D649 Anemia, unspecified: Secondary | ICD-10-CM | POA: Diagnosis not present

## 2022-01-08 DIAGNOSIS — I5032 Chronic diastolic (congestive) heart failure: Secondary | ICD-10-CM | POA: Diagnosis not present

## 2022-01-08 DIAGNOSIS — E038 Other specified hypothyroidism: Secondary | ICD-10-CM | POA: Diagnosis not present

## 2022-01-08 LAB — CBC
HCT: 29.5 % — ABNORMAL LOW (ref 36.0–46.0)
Hemoglobin: 10.3 g/dL — ABNORMAL LOW (ref 12.0–15.0)
MCH: 32.9 pg (ref 26.0–34.0)
MCHC: 34.9 g/dL (ref 30.0–36.0)
MCV: 94.2 fL (ref 80.0–100.0)
Platelets: 283 10*3/uL (ref 150–400)
RBC: 3.13 MIL/uL — ABNORMAL LOW (ref 3.87–5.11)
RDW: 14 % (ref 11.5–15.5)
WBC: 7.7 10*3/uL (ref 4.0–10.5)
nRBC: 0 % (ref 0.0–0.2)

## 2022-01-08 LAB — PROTIME-INR
INR: 1.6 — ABNORMAL HIGH (ref 0.8–1.2)
Prothrombin Time: 18.7 seconds — ABNORMAL HIGH (ref 11.4–15.2)

## 2022-01-08 LAB — HEPARIN LEVEL (UNFRACTIONATED): Heparin Unfractionated: 0.48 IU/mL (ref 0.30–0.70)

## 2022-01-08 MED ORDER — WARFARIN SODIUM 7.5 MG PO TABS
7.5000 mg | ORAL_TABLET | Freq: Once | ORAL | Status: AC
  Administered 2022-01-08: 7.5 mg via ORAL
  Filled 2022-01-08: qty 1

## 2022-01-08 NOTE — Progress Notes (Signed)
Physical Therapy Treatment ?Patient Details ?Name: [name] [name] ?MRN: [id number] ?DOB: 08-14-1951 ?Today's Date: 01/08/2022 ? ? ?History of Present Illness Pt is a 71 yr old female who presented 3/16 due to a fall. A-ray showed  tibia plateau, tibia shaft, and lat malleolus fxs.S/p R ORIF on 3/20.  Pt also with  Acute minimally displaced fracture of the fifth metatarsal on the L.  PMH: PAF s/p maze, MVR a/p replacement valve, multiple strokes, HTN, HFpEF, breast ca, COPD, ? ?  ?PT Comments  ? ? Pt making slow but steady progress towards goals this session with progression of transfers with OT. Pt needing mod-max assist +2 to come to full upright standing x3 with ability to maintain 10-15 seconds each trial. Pt needing assist to power up and to push through UE to elevate trunk. Pt with fair compliance for WB precautions with mod verbal cues. Pt with good tolerance for LE therex for increased ROM and strength. Current plan remains appropriate to address deficits and maximize functional independence and decrease caregiver burden. Pt continues to benefit from skilled PT services to progress toward functional mobility goals.  ?  ?Recommendations for follow up therapy are one component of a multi-disciplinary discharge planning process, led by the attending physician.  Recommendations may be updated based on patient status, additional functional criteria and insurance authorization. ? ?Follow Up Recommendations ? Acute inpatient rehab (3hours/day) ?  ?  ?Assistance Recommended at Discharge Frequent or constant Supervision/Assistance  ?Patient can return home with the following Two people to help with walking and/or transfers;Two people to help with bathing/dressing/bathroom;Help with stairs or ramp for entrance;Assist for transportation;Assistance with cooking/housework ?  ?Equipment Recommendations ? Wheelchair cushion (measurements PT);Wheelchair (measurements PT)  ?  ?Recommendations for Other Services   ? ? ?   ?Precautions / Restrictions Precautions ?Precautions: Fall ?Required Braces or Orthoses: Other Brace ?Other Brace: post op shoe ?Restrictions ?Weight Bearing Restrictions: Yes ?RLE Weight Bearing: Non weight bearing ?LLE Weight Bearing: Weight bearing as tolerated (in post op shoe, transfers only) ?Other Position/Activity Restrictions: Per notes, unrestricted ROM  ?  ? ?Mobility ? Bed Mobility ?Overal bed mobility: Needs Assistance ?Bed Mobility: Supine to Sit, Sit to Supine ?  ?  ?Supine to sit: Min guard ?Sit to supine: Min assist ?  ?General bed mobility comments: min A to return to bed to assist RLE ?  ? ?Transfers ?Overall transfer level: Needs assistance ?Equipment used: Rolling walker (2 wheels) ?Transfers: Sit to/from Stand ?Sit to Stand: Mod assist, Max assist, +2 physical assistance ?  ?  ?  ?  ?  ?General transfer comment: mod-max assist +2 to come to full standing x3 trials for 10-15 seconds each trial, assist for power up, steadying, and to maintain WB precautions ?  ? ?Ambulation/Gait ?  ?  ?  ?  ?  ?  ?  ?  ? ? ?Stairs ?  ?  ?  ?  ?  ? ? ?Wheelchair Mobility ?  ? ?Modified Rankin (Stroke Patients Only) ?  ? ? ?  ?Balance Overall balance assessment: Needs assistance ?Sitting-balance support: Bilateral upper extremity supported, Single extremity supported, Feet supported ?Sitting balance-Leahy Scale: Fair ?  ?Postural control: Posterior lean ?Standing balance support: Bilateral upper extremity supported ?Standing balance-Leahy Scale: Poor ?Standing balance comment: heavy reliance on BUE ?  ?  ?  ?  ?  ?  ?  ?  ?  ?  ?  ?  ? ?  ?Cognition Arousal/Alertness: Awake/alert ?Behavior During  Therapy: WFL for tasks assessed/performed ?Overall Cognitive Status: Within Functional Limits for tasks assessed ?  ?  ?  ?  ?  ?  ?  ?  ?  ?  ?  ?  ?  ?  ?  ?  ?  ?  ?  ? ?  ?Exercises General Exercises - Lower Extremity ?Long Arc Quad: AROM, Left, 10 reps, Seated ?Hip ABduction/ADduction: AROM, Both, 10 reps, Seated  (Pillow squeeze adduction x10) ?Hip Flexion/Marching: AROM, Both, 20 reps, Seated ? ?  ?General Comments   ?  ?  ? ?Pertinent Vitals/Pain Pain Assessment ?Pain Assessment: Faces ?Faces Pain Scale: Hurts little more ?Pain Location: RLE ?Pain Descriptors / Indicators: Aching, Grimacing, Guarding ?Pain Intervention(s): Limited activity within patient's tolerance, Monitored during session, Repositioned  ? ? ?Home Living   ?  ?  ?  ?  ?  ?  ?  ?  ?  ?   ?  ?Prior Function    ?  ?  ?   ? ?PT Goals (current goals can now be found in the care plan section) Acute Rehab PT Goals ?PT Goal Formulation: With patient ?Time For Goal Achievement: 01/20/22 ? ?  ?Frequency ? ? ? Min 5X/week ? ? ? ?  ?PT Plan    ? ? ?Co-evaluation PT/OT/SLP Co-Evaluation/Treatment: Yes ?Reason for Co-Treatment: For patient/therapist safety;To address functional/ADL transfers;Complexity of the patient's impairments (multi-system involvement) ?PT goals addressed during session: Mobility/safety with mobility;Balance;Proper use of DME ?  ?  ? ?  ?AM-PAC PT "6 Clicks" Mobility   ?Outcome Measure ? Help needed turning from your back to your side while in a flat bed without using bedrails?: A Little ?Help needed moving from lying on your back to sitting on the side of a flat bed without using bedrails?: A Little ?Help needed moving to and from a bed to a chair (including a wheelchair)?: Total ?Help needed standing up from a chair using your arms (e.g., wheelchair or bedside chair)?: Total ?Help needed to walk in hospital room?: Total ?Help needed climbing 3-5 steps with a railing? : Total ?6 Click Score: 10 ? ?  ?End of Session Equipment Utilized During Treatment: Gait belt ?Activity Tolerance: Patient tolerated treatment well ?Patient left: in bed;with call bell/phone within reach;with bed alarm set ?Nurse Communication: Mobility status ?PT Visit Diagnosis: Other abnormalities of gait and mobility (R26.89);Muscle weakness (generalized) (M62.81);Difficulty  in walking, not elsewhere classified (R26.2) ?  ? ? ?Time: 1010-1037 ?PT Time Calculation (min) (ACUTE ONLY): 27 min ? ?Charges:  $Gait Training: 8-22 mins          ?          ? ?Audry Riles. PTA ?Acute Rehabilitation Services ?Office: [phone] ? ? ? ?Betsey Holiday Kischa Altice ?01/08/2022, 11:23 AM ? ?

## 2022-01-08 NOTE — Progress Notes (Signed)
ANTICOAGULATION CONSULT NOTE - Follow Up Consult ? ?Pharmacy Consult for Heparin bridge to warfarin ?Indication:  mechanical mitral valve + AFib ? ?Allergies  ?Allergen Reactions  ? Erythromycin Nausea And Vomiting  ? Atorvastatin Other (See Comments)  ?  Pt reports causes body to be stiff, joints ache, aching in knees and ankles, feels more fatigued.   ? Lactose Intolerance (Gi) Other (See Comments)  ?  Upset stomach - yoghurt  ? Valsartan Other (See Comments)  ?  Pt reports caused her depression  ? ? ?Patient Measurements: ?Height: 5' 6.5" (168.9 cm) ?Weight: 65.8 kg (145 lb) ?IBW/kg (Calculated) : 60.45 ?Heparin Dosing Weight: 66 kg ? ?Vital Signs: ?Temp: 97.6 ?F (36.4 ?C) (03/23 0840) ?Temp Source: Oral (03/23 0840) ?BP: 103/53 (03/23 0840) ?Pulse Rate: 59 (03/23 0840) ? ?Labs: ?Recent Labs  ?  01/06/22 ?0243 01/06/22 ?1133 01/06/22 ?2012 01/07/22 ?0717 01/08/22 ?0321  ?HGB 10.0*  --   --  10.3* 10.3*  ?HCT 29.0*  --   --  30.2* 29.5*  ?PLT 222  --   --  251 283  ?LABPROT 14.6  --   --  16.9* 18.7*  ?INR 1.1  --   --  1.4* 1.6*  ?HEPARINUNFRC 0.18*   < > 0.36 0.38 0.48  ?CREATININE 0.65  --   --  0.72  --   ? < > = values in this interval not displayed.  ? ? ? ?Estimated Creatinine Clearance: 60.7 mL/min (by C-G formula based on SCr of 0.72 mg/dL). ? ?Assessment: ?71 year old female with a mechanical mitral valve and AFib s/p ORIF on 01/05/22 for fracture of R-knee after fall. Pharmacy consulted to resume and manage heparin bridge to warfarin post-op. ? ?Heparin level 0.48, therapeutic ?INR 1.6, subtherapeutic ?H/H/Plt stable ?No s/sx of bleeding ?  ?PTA Warfarin regimen:  7.5 mg Sun/Tues/Thurs + 5 mg Mon/Wed/Fri/Sat ? ?Goal of Therapy:  ?Heparin level 0.3-0.7 units/ml ?INR 2.5-3.5 ?Monitor platelets by anticoagulation protocol: Yes ?  ?Plan:  ?Continue heparin infusion at therapeutic rate of 1250 units/hr  ?Warfarin 7.'5mg'$  po x1  ? ?Heparin level and PT/INR daily with AM labs ?CBC daily  ?Continue to monitor for  s/sx of bleeding. ? ?Luisa Hart, PharmD, BCPS ?Clinical Pharmacist ?01/08/2022 9:12 AM  ? ?Please refer to [facility] for pharmacy phone number  ?

## 2022-01-08 NOTE — Progress Notes (Signed)
Occupational Therapy Treatment ?Patient Details ?Name: [name] [name] ?MRN: [id number] ?DOB: [birth year] ?Today's Date: 01/08/2022 ? ? ?History of present illness Pt is a 71 yr old female who presented 3/16 due to a fall. A-ray showed  tibia plateau, tibia shaft, and lat malleolus fxs.S/p R ORIF on 3/20.  Pt also with  Acute minimally displaced fracture of the fifth metatarsal on the L.  PMH: PAF s/p maze, MVR a/p replacement valve, multiple strokes, HTN, HFpEF, breast ca, COPD, ?  ?OT comments ? Pt was motivated with two therapist present to attempt to complete transfers in session but did need encouragement once in a standing position with max tolerance of 15 seconds with mod-max x2 for sit to stand transfer. Pt was able to done LLE with set up LE dressing but required max with RLE in bed and sitting at EOB. Pt currently with functional limitations due to the deficits listed below (see OT Problem List).  Pt will benefit from skilled OT to increase their safety and independence with ADL and functional mobility for ADL to facilitate discharge to venue listed below.  ?  ? ?Recommendations for follow up therapy are one component of a multi-disciplinary discharge planning process, led by the attending physician.  Recommendations may be updated based on patient status, additional functional criteria and insurance authorization. ?   ?Follow Up Recommendations ? Acute inpatient rehab (3hours/day)  ?  ?Assistance Recommended at Discharge Frequent or constant Supervision/Assistance  ?Patient can return home with the following ? A lot of help with walking and/or transfers;A lot of help with bathing/dressing/bathroom;Assistance with cooking/housework;Assist for transportation ?  ?Equipment Recommendations ? None recommended by OT  ?  ?Recommendations for Other Services   ? ?  ?Precautions / Restrictions Precautions ?Precautions: Fall ?Required Braces or Orthoses: Other Brace ?Other Brace: post op shoe ?Restrictions ?Weight  Bearing Restrictions: Yes ?RLE Weight Bearing: Non weight bearing ?LLE Weight Bearing: Weight bearing as tolerated ?Other Position/Activity Restrictions: Per notes, unrestricted ROM  ? ? ?  ? ?Mobility Bed Mobility ?Overal bed mobility: Needs Assistance ?Bed Mobility: Supine to Sit, Sit to Supine ?Rolling: Min guard ?  ?Supine to sit: Min guard ?Sit to supine: Min assist ?  ?General bed mobility comments: pt needs trunk control once sitting at EOB as easliy fatigues ?  ? ?Transfers ?Overall transfer level: Needs assistance ?Equipment used: Rolling walker (2 wheels) ?Transfers: Sit to/from Stand ?Sit to Stand: Mod assist, Max assist, +2 physical assistance ?  ?  ?  ?  ?  ?General transfer comment: mod-max assist +2 to come to full standing x3 trials for 10-15 seconds each trial, assist for power up, steadying, and to maintain WB precautions ?  ?  ?Balance Overall balance assessment: Needs assistance ?Sitting-balance support: Bilateral upper extremity supported, Single extremity supported, Feet supported ?Sitting balance-Leahy Scale: Fair ?  ?Postural control: Posterior lean ?Standing balance support: Bilateral upper extremity supported ?Standing balance-Leahy Scale: Poor ?Standing balance comment: heavy reliance on BUE ?  ?  ?  ?  ?  ?  ?  ?  ?  ?  ?  ?   ? ?ADL either performed or assessed with clinical judgement  ? ?ADL Overall ADL's : Needs assistance/impaired ?Eating/Feeding: Independent;Sitting ?  ?Grooming: Wash/dry hands;Wash/dry face;Set up;Cueing for safety;Cueing for sequencing;Sitting ?  ?Upper Body Bathing: Min guard;Sitting ?  ?Lower Body Bathing: Maximal assistance;Cueing for safety;Cueing for sequencing;Bed level ?  ?Upper Body Dressing : Min guard;Cueing for safety;Cueing for sequencing;Sitting ?  ?Lower Body Dressing:  Maximal assistance;Bed level ?Lower Body Dressing Details (indicate cue type and reason): Pt was able to don sock and post op shoe onto LLE but with completion of donning LE clothing  needs max assist around hips ?  ?  ?  ?  ?  ?  ?  ?General ADL Comments: Pt complete transfers for 3 trials ?  ? ?Extremity/Trunk Assessment Upper Extremity Assessment ?Upper Extremity Assessment: Generalized weakness ?  ?Lower Extremity Assessment ?Lower Extremity Assessment: Defer to PT evaluation ?  ?  ?  ? ?Vision   ?Vision Assessment?: No apparent visual deficits ?  ?Perception Perception ?Perception: Not tested ?  ?Praxis Praxis ?Praxis: Not tested ?  ? ?Cognition Arousal/Alertness: Awake/alert ?Behavior During Therapy: [facility] for tasks assessed/performed ?Overall Cognitive Status: Within Functional Limits for tasks assessed ?  ?  ?  ?  ?  ?  ?  ?  ?  ?  ?  ?  ?  ?  ?  ?  ?  ?  ?  ?   ?Exercises   ? ?  ?Shoulder Instructions   ? ? ?  ?General Comments    ? ? ?Pertinent Vitals/ Pain       Pain Assessment ?Pain Assessment: Faces ?Faces Pain Scale: Hurts little more ?Pain Location: RLE ?Pain Descriptors / Indicators: Aching, Grimacing, Guarding ?Pain Intervention(s): Limited activity within patient's tolerance, Monitored during session, Repositioned ? ?Home Living   ?  ?  ?  ?  ?  ?  ?  ?  ?  ?  ?  ?  ?  ?  ?  ?  ?  ?  ? ?  ?Prior Functioning/Environment    ?  ?  ?  ?   ? ?Frequency ? Min 2X/week  ? ? ? ? ?  ?Progress Toward Goals ? ?OT Goals(current goals can now be found in the care plan section) ? Progress towards OT goals: Progressing toward goals ? ?Acute Rehab OT Goals ?Patient Stated Goal: to go to rehab ?OT Goal Formulation: With patient ?Time For Goal Achievement: 01/20/22 ?Potential to Achieve Goals: Good ?ADL Goals ?Pt Will Perform Upper Body Dressing: Independently;sitting ?Pt Will Perform Lower Body Dressing: with min guard assist;sit to/from stand ?Pt Will Transfer to Toilet: squat pivot transfer;bedside commode;with min assist ?Pt Will Perform Tub/Shower Transfer: with mod assist;Squat pivot transfer  ?Plan Discharge plan remains appropriate;Frequency remains appropriate   ? ?Co-evaluation ? ? ?  PT/OT/SLP Co-Evaluation/Treatment: Yes ?Reason for Co-Treatment: Complexity of the patient's impairments (multi-system involvement) ?PT goals addressed during session: Mobility/safety with mobility;Balance;Proper use of DME ?OT goals addressed during session: ADL's and self-care ?  ? ?  ?AM-PAC OT "6 Clicks" Daily Activity     ?Outcome Measure ? ? Help from another person eating meals?: None ?Help from another person taking care of personal grooming?: A Little ?Help from another person toileting, which includes using toliet, bedpan, or urinal?: A Lot ?Help from another person bathing (including washing, rinsing, drying)?: A Lot ?Help from another person to put on and taking off regular upper body clothing?: A Little ?Help from another person to put on and taking off regular lower body clothing?: A Lot ?6 Click Score: 16 ? ?  ?End of Session Equipment Utilized During Treatment: Gait belt;Rolling walker (2 wheels) ? ?OT Visit Diagnosis: Unsteadiness on feet (R26.81);Other abnormalities of gait and mobility (R26.89);Muscle weakness (generalized) (M62.81);Pain ?Pain - Right/Left: Right ?Pain - part of body: Leg ?  ?Activity Tolerance Patient limited by fatigue ?  ?  Patient Left in bed;with call bell/phone within reach;with bed alarm set ?  ?Nurse Communication   ?  ? ?   ? ?Time: 4709-6283 ?OT Time Calculation (min): 28 min ? ?Charges: OT General Charges ?$OT Visit: 1 Visit ?OT Treatments ?$Self Care/Home Management : 8-22 mins ? ?Joeseph Amor OTR/L  ?Acute Rehab Services  ?[phone] office number ?[phone] pager number ? ? ?Joeseph Amor ?01/08/2022, 12:21 PM ?

## 2022-01-08 NOTE — Progress Notes (Addendum)
? ?                              Orthopaedic Trauma Service Progress Note ? ?Patient ID: ?[name] [name] ?MRN: [id number] ?DOB/AGE: 1951/10/02 71 y.o. ? ?Subjective: ? ?Doing ok  ? ?Still has not been able to transfer to chair  ? ?CIR vs SNF ? ?Minimal pain in R knee and left foot  ? ?ROS ?As above ? ?Objective:  ? ?VITALS:   ?Vitals:  ? 01/07/22 1500 01/07/22 2016 01/08/22 0603 01/08/22 0840  ?BP: (!) 117/58 (!) 126/59 123/69 (!) 103/53  ?Pulse: 79 100 77 (!) 59  ?Resp: '18 16 18 17  '$ ?Temp: 98.3 ?F (36.8 ?C) 98.3 ?F (36.8 ?C) 98.3 ?F (36.8 ?C) 97.6 ?F (36.4 ?C)  ?TempSrc: Oral Oral Oral Oral  ?SpO2: 97% 93% 98% 99%  ?Weight:      ?Height:      ? ? ?Estimated body mass index is 23.05 kg/m? as calculated from the following: ?  Height as of this encounter: 5' 6.5" (1.689 m). ?  Weight as of this encounter: 65.8 kg. ? ? ?Intake/Output   ?   03/22 0701 ?03/23 0700 03/23 0701 ?03/24 0700  ? P.O. 236   ? Total Intake(mL/kg) 236 (3.6)   ? Net +236   ?     ? Urine Occurrence 1 x   ?  ? ?LABS ? ?Results for orders placed or performed during the hospital encounter of 01/01/22 (from the past 24 hour(s))  ?CBC     Status: Abnormal  ? Collection Time: 01/08/22  3:21 AM  ?Result Value Ref Range  ? WBC 7.7 4.0 - 10.5 K/uL  ? RBC 3.13 (L) 3.87 - 5.11 MIL/uL  ? Hemoglobin 10.3 (L) 12.0 - 15.0 g/dL  ? HCT 29.5 (L) 36.0 - 46.0 %  ? MCV 94.2 80.0 - 100.0 fL  ? MCH 32.9 26.0 - 34.0 pg  ? MCHC 34.9 30.0 - 36.0 g/dL  ? RDW 14.0 11.5 - 15.5 %  ? Platelets 283 150 - 400 K/uL  ? nRBC 0.0 0.0 - 0.2 %  ?Protime-INR     Status: Abnormal  ? Collection Time: 01/08/22  3:21 AM  ?Result Value Ref Range  ? Prothrombin Time 18.7 (H) 11.4 - 15.2 seconds  ? INR 1.6 (H) 0.8 - 1.2  ?Heparin level (unfractionated)     Status: None  ? Collection Time: 01/08/22  3:21 AM  ?Result Value Ref Range  ? Heparin Unfractionated 0.48 0.30 - 0.70 IU/mL  ? ?*Note: Due to a large number of results and/or encounters for the  requested time period, some results have not been displayed. A complete set of results can be found in Results Review.  ? ? ? ?PHYSICAL EXAM:  ? ?Gen: sitting up in bed, pleasant ?Lungs: unlabored ?Ext:  ?     Right Lower Extremity  ?            Dressing clean, dry and intact ? Dressings removed ?  All incisions look great  ?  No drainage ?  No signs of infection  ?  Ecchymosis decreasing  ?             ? Pressure sores to R heel, dorsum foot and lateral R foot all stable and look improved  ?  New mepilex dressings applied  ? Mild swelling  ?  Ext warm  ?            Compartments are soft  ?            No pain with passive stretching  ?            Motor and sensory functions intact ?            using bone foam and tolerating  ?  ?     Left Lower extremity  ?            No change in left foot exam ?            Ecchymosis stable ?            No further increase in pain  ?  ? ?Assessment/Plan: ?3 Days Post-Op  ? ? ? ?Anti-infectives (From admission, onward)  ? ? Start     Dose/Rate Route Frequency Ordered Stop  ? 01/05/22 1200  ceFAZolin (ANCEF) IVPB 1 g/50 mL premix       ? 1 g ?100 mL/hr over 30 Minutes Intravenous Every 6 hours 01/05/22 1110 01/05/22 2341  ? 01/05/22 0730  ceFAZolin (ANCEF) IVPB 2g/100 mL premix       ? 2 g ?200 mL/hr over 30 Minutes Intravenous On call to O.R. 01/04/22 1201 01/05/22 0835  ? 01/05/22 0600  ceFAZolin (ANCEF) IVPB 2g/100 mL premix  Status:  Discontinued       ? 2 g ?200 mL/hr over 30 Minutes Intravenous On call to O.R. 01/05/22 0205 01/05/22 0227  ? ?  ?. ? ?POD/HD#: 3 ? ?71 y/o female s/p fall with R tibial plateau, R distal fibula fracture, L 5th metatarsal fracture. History of mitral valve replacement, chronic anticoagulation, MS, osteoporosis, thyroid disease, CHF  ?  ?-fall  ?  ?- R tibial plateau and shaft fracture s/p ORIF  ?            NWB R leg x 6-8 weeks ?            Unrestricted ROM R knee  ?            PT/OT ?            Ice and elevate for swelling and pain  control  ?            CIR consult ?            TOC consult for SNF  ?            Dressing changed today  ?  Change as needed with new mepilex ? Will get compression sock as well  ?  ?            FLOAT HEELS OFF BED- pt with pressure sore to R heel  ?  ?PT- please teach HEP for R knee ROM- AROM, PROM. No ROM restrictions.  Quad sets, SLR, LAQ, SAQ, heel slides, stretching ?  ?Ankle theraband program, heel cord stretching, toe towel curls, etc ?  ?No pillows under bend of knee when at rest, ok to place under heel to help work on extension. Can also use zero knee bone foam if available ? ?- Pressure sores R heel, R lateral foot, dorsum R foot stage 1, noted by ortho trauma service upon removal of splint in OR. Unable to comment on presence on admission ? Silicone foam dressings applied ? Continue to monitor ? Float heels to prevent pressure  ? Ok to change dressings  as needed  ? ?  ?-R lateral malleolus fracture s/p screw fixation  ?            NWB as above ?            No bracing  ?            No ROM restrictions  ?  ?- L 5th MTT fracture ?            Non-op ?            WBAT with post op shoe for transfers only  ?            Shoe only needs to be on when transferring   ?  ?- Pain management: ?            Multimodal ?            Doing well on tylenol only  ?  ?- ABL anemia/Hemodynamics ?            Stable  ?            Monitor ?  ?- Medical issues  ?            Per primary  ?  ?- DVT/PE prophylaxis: ?            Heparin bridge to coumadin  ?  ?- ID:  ?            Periop abx completed  ?  ?- Metabolic Bone Disease: ?            Vitamin d looks good ?            Known osteoporosis  ?            Confirmed clinically in OR, bone very soft  ?            Time to consider pharmacologic treatment for osteoporosis  ?            Will discuss with pt in outpt setting if she would like to discuss this with her PCP or if she would like to go to a provider that specializes in osteoporosis  ?            DEXA 2022 done at [facility] shows  t score of -3.4 at R femoral neck  ?  ?- Activity: ?            As above ?  ?- Impediments to fracture healing: ?            Osteoporosis  ?             ?- Dispo: ?            continue with therapies ?            CIR consult  ?            TOC for SNF as backup  ? Ortho issues stable ? Follow up with Ortho in 10-14 days  ? ?Jari Pigg, PA-C ?[phone] (C) ?01/08/2022, 9:24 AM ? ?[facility] ?[place] [address] ?[phone] Jenetta Downer) ?[phone] (F) ? ? ? ?After 5pm and on the weekends please log on to Amion, go to orthopaedics and the look under the [facility] Call for the provider(s) on call. You can also call our office at [phone] and then follow the prompts to be connected to the call team.  ? Patient ID: Judeen Hammans  Vivianne Spence, female   DOB: 1950-12-16, 71 y.o.   MRN: [id number] ? ?

## 2022-01-08 NOTE — Progress Notes (Signed)
Orthopedic Tech Progress Note ?Patient Details:  ?[name] [name] ?07-17-1951 ?[id number] ? ?Patient ID: [name] [name], female   DOB: [birth year], 71 y.o.   MRN: [id number] ?Placed order with [facility] for compression sock. ? ?[name] [name] ?01/08/2022, 8:50 PM ? ?

## 2022-01-08 NOTE — Progress Notes (Signed)
IP rehab admissions - I met with patient at her bedside.  Patient prefers [facility].  She lives in a townhouse in [place] and this SNF is close to her.  I have let the SW know about preference for [facility].  Call me for questions.  [phone] ?

## 2022-01-08 NOTE — Progress Notes (Signed)
?Progress Note ? ? ?Patient: [name] [name] DDU:[id number] DOB: 1950-12-21 DOA: 01/01/2022     7 ?DOS: the patient was seen and examined on 01/08/2022 ?  ?Brief hospital course: ?[name] [name] was admitted to the hospital with the working diagnosis of right tibial plateau fracture, now SP open reduction and internal fixation.  ? ?Patient is a 71 year old female with history of paroxysmal A-fib status post Maze procedure, mitral valve replacement with mechanical valve, on Coumadin, history of CVA, chronic diastolic CHF, hypertension who presented from home after a fall followed by right knee pain. She was not able to ambulate due to pain. On her initial physical examination her blood pressure was 156/68, HR 89, RR 15 and 02 saturation 99% on room air. Her lungs were clear to auscultation, heart with S1 and S2 present and rhythmic, abdomen soft, right leg with tender edema at the level of the knee.  ? ?Na 140, K 3.5 Cl 102. Bicarbonate 29, glucose 125. Bun 22 cr 0,73  ?Wbc 10,2 hgb 12,6 hct 36,8 plt 200  ?INR 2.9  ?Sars covid 19 negative  ? ?CT right lower extremity with non displaced intra-articular fracture anteriorly in the mid lower leg, involving both tibial plateaus. Distal extension of the fracture into the metadiaphysis.  ? ?Coumadin held, started on IV heparin, ?Orthopedics were consulted  ?Underwent ORIF 3/20, heparin/coumadin resumed postop. ? ?Pending therapeutic INR before patient gets transferred to SNF.  ? ?Assessment and Plan: ?* Tibial plateau fracture, right ?Tibial plateau fracture, proximal tibial fracture, fibular fracture, lateral malleolus fracture.  ?Sp ORIF ?Continue pain control and physical/ occupational therapy.  ?Plan to transfer to SNF.  ? ?CHF (congestive heart failure) ([place]) ?Normal LVEF and functioning MV prosthesis as of 2d echo in 2021. ?No clinical signs of exacerbation  ?Continue with lisinopril and metoprolol.  ? ?Essential hypertension ?Continue blood pressure control with  lisinopril and metoprolol.  ? ?Hypothyroidism ?On levothyroxine  ? ?S/P mitral valve replacement ?Mechanical mitral valve.  ?Continue anticoagulation with warfarin and bridge with IV heparin. ?Target INR goal 2,5 to 3,5.  ?Follow pharmacy protocol.  ? ?Paroxysmal atrial fibrillation ([place]) ?Rate control with metoprolol and anticoagulation with warfarin  ?INR still not therapeutic, today is 1,6. ?Continue warfarin per pharmacy protocol.  ? ?Normocytic anemia ?Stable hgb and hct  ? ?Malnutrition of moderate degree ?Continue with nutritional supplements.  ? ? ? ? ?  ? ?Subjective: patient is feeling better, pain is better controlled and her mobility has improved. No chest pain or dyspnea  ? ?Physical Exam: ?Vitals:  ? 01/07/22 2016 01/08/22 0603 01/08/22 0840 01/08/22 1251  ?BP: (!) 126/59 123/69 (!) 103/53 (!) 115/49  ?Pulse: 100 77 (!) 59 80  ?Resp: '16 18 17 17  '$ ?Temp: 98.3 ?F (36.8 ?C) 98.3 ?F (36.8 ?C) 97.6 ?F (36.4 ?C) 97.7 ?F (36.5 ?C)  ?TempSrc: Oral Oral Oral Oral  ?SpO2: 93% 98% 99% 100%  ?Weight:      ?Height:      ? ?Neurology awake and alert ?ENT with no pallor ?Cardiovascular with S1 and S2 present and rhythmic, with no gallops or rubs. No JVD ?No lower extremity edema ?Respiratory with no wheezing or rales ?Abdomen soft and non distended  ?Data Reviewed: ? ? ? ?Family Communication: no family at the bedside  ? ?Disposition: ?Status is: Inpatient ?Remains inpatient appropriate because: pending transfer to SNF when INR 2,5 or greater.  ? Planned Discharge Destination: Skilled nursing facility ? ? ? ?Author: ?Tawni Millers, MD ?01/08/2022 3:06  PM ? ?For on call review [email or web address].  ?

## 2022-01-09 LAB — CBC
HCT: 31.3 % — ABNORMAL LOW (ref 36.0–46.0)
Hemoglobin: 10.6 g/dL — ABNORMAL LOW (ref 12.0–15.0)
MCH: 32.4 pg (ref 26.0–34.0)
MCHC: 33.9 g/dL (ref 30.0–36.0)
MCV: 95.7 fL (ref 80.0–100.0)
Platelets: 348 10*3/uL (ref 150–400)
RBC: 3.27 MIL/uL — ABNORMAL LOW (ref 3.87–5.11)
RDW: 14 % (ref 11.5–15.5)
WBC: 9.4 10*3/uL (ref 4.0–10.5)
nRBC: 0 % (ref 0.0–0.2)

## 2022-01-09 LAB — PROTIME-INR
INR: 1.8 — ABNORMAL HIGH (ref 0.8–1.2)
Prothrombin Time: 21.3 seconds — ABNORMAL HIGH (ref 11.4–15.2)

## 2022-01-09 LAB — HEPARIN LEVEL (UNFRACTIONATED): Heparin Unfractionated: 0.48 IU/mL (ref 0.30–0.70)

## 2022-01-09 MED ORDER — WARFARIN SODIUM 7.5 MG PO TABS
7.5000 mg | ORAL_TABLET | Freq: Once | ORAL | Status: AC
  Administered 2022-01-09: 7.5 mg via ORAL
  Filled 2022-01-09: qty 1

## 2022-01-09 NOTE — Discharge Instructions (Addendum)
? ?Orthopaedic Trauma Service Discharge Instructions ? ? ?General Discharge Instructions ? ?Orthopaedic Injuries: ?     - R tibial plateau and shaft fracture s/p ORIF  ?            NWB R leg x 6-8 weeks ?            Unrestricted ROM R knee  ?            PT/OT ?            Ice and elevate for swelling and pain control  ?            Dressing changes every other day starting on 01/10/2022 ?                        Change as needed with new mepilex ?            compression socks. Ok to take off at night. Place on in morning  ?  ?            FLOAT HEELS OFF BED- pt with pressure sore to R heel  ?  ?PT- please teach HEP for R knee ROM- AROM, PROM. No ROM restrictions.  Quad sets, SLR, LAQ, SAQ, heel slides, stretching ?  ?Ankle theraband program, heel cord stretching, toe towel curls, etc ?  ?No pillows under bend of knee when at rest, ok to place under heel to help work on extension. Can also use zero knee bone foam if available ?  ?- Pressure sores R heel, R lateral foot, dorsum R foot stage 1, noted by ortho trauma service upon removal of splint in OR. Unable to comment on presence on admission ?            Silicone foam dressings applied ?            Continue to monitor ?            Float heels to prevent pressure  ?            Ok to change dressings as needed  ? Check skin daily  ?  ?  ?-R lateral malleolus fracture s/p screw fixation  ?            NWB as above ?            No bracing  ?            No ROM restrictions  ?  ?- L 5th MTT fracture ?            Non-op ?            WBAT with post op shoe for transfers only  ?            Shoe only needs to be on when transferring   ?  ?Diet: as you were eating previously.  Can use over the counter stool softeners and bowel preparations, such as Miralax, to help with bowel movements.  Narcotics can be constipating.  Be sure to drink plenty of fluids ? ?PAIN MEDICATION USE AND EXPECTATIONS ? You have likely been given narcotic medications to help control your pain.  After a  traumatic event that results in an fracture (broken bone) with or without surgery, it is ok to use narcotic pain medications to help control one's pain.  We understand that everyone responds to pain differently and each individual patient will be evaluated on a regular  basis for the continued need for narcotic medications. Ideally, narcotic medication use should last no more than 6-8 weeks (coinciding with fracture healing).  ? As a patient it is your responsibility as well to monitor narcotic medication use and report the amount and frequency you use these medications when you come to your office visit.  ? We would also advise that if you are using narcotic medications, you should take a dose prior to therapy to maximize you participation. ? ?IF YOU ARE ON NARCOTIC MEDICATIONS IT IS NOT PERMISSIBLE TO OPERATE A MOTOR VEHICLE (MOTORCYCLE/CAR/TRUCK/MOPED) OR HEAVY MACHINERY ?DO NOT MIX NARCOTICS WITH OTHER CNS (CENTRAL NERVOUS SYSTEM) DEPRESSANTS SUCH AS ALCOHOL ? ? ?POST-OPERATIVE OPIOID TAPER INSTRUCTIONS: ?It is important to wean off of your opioid medication as soon as possible. If you do not need pain medication after your surgery it is ok to stop day one. ?Opioids include: ?Codeine, Hydrocodone(Norco, Vicodin), Oxycodone(Percocet, oxycontin) and hydromorphone amongst others.  ?Long term and even short term use of opiods can cause: ?Increased pain response ?Dependence ?Constipation ?Depression ?Respiratory depression ?And more.  ?Withdrawal symptoms can include ?Flu like symptoms ?Nausea, vomiting ?And more ?Techniques to manage these symptoms ?Hydrate well ?Eat regular healthy meals ?Stay active ?Use relaxation techniques(deep breathing, meditating, yoga) ?Do Not substitute Alcohol to help with tapering ?If you have been on opioids for less than two weeks and do not have pain than it is ok to stop all together.  ?Plan to wean off of opioids ?This plan should start within one week post op of your fracture  surgery  ?Maintain the same interval or time between taking each dose and first decrease the dose.  ?Cut the total daily intake of opioids by one tablet each day ?Next start to increase the time between doses. ?The last dose that should be eliminated is the evening dose.  ? ? ?STOP SMOKING OR USING NICOTINE PRODUCTS!!!! ? As discussed nicotine severely impairs your body's ability to heal surgical and traumatic wounds but also impairs bone healing.  Wounds and bone heal by forming microscopic blood vessels (angiogenesis) and nicotine is a vasoconstrictor (essentially, shrinks blood vessels).  Therefore, if vasoconstriction occurs to these microscopic blood vessels they essentially disappear and are unable to deliver necessary nutrients to the healing tissue.  This is one modifiable factor that you can do to dramatically increase your chances of healing your injury.   ? (This means no smoking, no nicotine gum, patches, etc) ? ?DO NOT USE NONSTEROIDAL ANTI-INFLAMMATORY DRUGS (NSAID'S) ? Using products such as Advil (ibuprofen), Aleve (naproxen), Motrin (ibuprofen) for additional pain control during fracture healing can delay and/or prevent the healing response.  If you would like to take over the counter (OTC) medication, Tylenol (acetaminophen) is ok.  However, some narcotic medications that are given for pain control contain acetaminophen as well. Therefore, you should not exceed more than 4000 mg of tylenol in a day if you do not have liver disease.  Also note that there are may OTC medicines, such as cold medicines and allergy medicines that my contain tylenol as well.  If you have any questions about medications and/or interactions please ask your doctor/PA or your pharmacist.  ?   ? ?ICE AND ELEVATE INJURED/OPERATIVE EXTREMITY ? Using ice and elevating the injured extremity above your heart can help with swelling and pain control.  Icing in a pulsatile fashion, such as 20 minutes on and 20 minutes off, can be  followed.   ? Do not place ice directly  on skin. Make sure there is a barrier between to skin and the ice pack.   ? Using frozen items such as frozen peas works well as the conform nicely to the are that needs to be iced. ? ?USE AN ACE WRAP OR TED HOSE FOR SWELLING CONTROL ? In addition to icing and elevation, Ace wraps or TED hose are used to help limit and resolve swelling.  It is recommended to use Ace wraps or TED hose until you are informed to stop.   ? When using Ace Wraps start the wrapping distally (farthest away from the body) and wrap proximally (closer to the body) ?  Example: If you had surgery on your leg or thing and you do not have a splint on, start the ace wrap at the toes and work your way up to the thigh ?       If you had surgery on your upper extremity and do not have a splint on, start the ace wrap at your fingers and work your way up to the upper arm ? ?IF YOU ARE IN A SPLINT OR CAST DO NOT REMOVE IT FOR ANY REASON  ? If your splint gets wet for any reason please contact the office immediately. You may shower in your splint or cast as long as you keep it dry.  This can be done by wrapping in a cast cover or garbage back (or similar) ? Do Not stick any thing down your splint or cast such as pencils, money, or hangers to try and scratch yourself with.  If you feel itchy take benadryl as prescribed on the bottle for itching ? ?IF YOU ARE IN A CAM BOOT (BLACK BOOT) ? You may remove boot periodically. Perform daily dressing changes as noted below.  Wash the liner of the boot regularly and wear a sock when wearing the boot. It is recommended that you sleep in the boot until told otherwise ? ? ? ?Call office for the following: ?Temperature greater than 101F ?Persistent nausea and vomiting ?Severe uncontrolled pain ?Redness, tenderness, or signs of infection (pain, swelling, redness, odor or green/yellow discharge around the site) ?Difficulty breathing, headache or visual  disturbances ?Hives ?Persistent dizziness or light-headedness ?Extreme fatigue ?Any other questions or concerns you may have after discharge ? ?In an emergency, call 911 or go to an Emergency Department at a nearby hospital ? ?[facility]

## 2022-01-09 NOTE — Progress Notes (Signed)
Physical Therapy Treatment ?Patient Details ?Name: [name] [name] ?MRN: [id number] ?DOB: Feb 27, 1951 ?Today's Date: 01/09/2022 ? ? ?History of Present Illness Pt is a 71 yr old female who presented 3/16 due to a fall. A-ray showed  tibia plateau, tibia shaft, and lat malleolus fxs.S/p R ORIF on 3/20.  Pt also with  Acute minimally displaced fracture of the fifth metatarsal on the L.  PMH: PAF s/p maze, MVR a/p replacement valve, multiple strokes, HTN, HFpEF, breast ca, COPD, ? ?  ?PT Comments  ? ? Pt with slow but steady progress towards goals this session requiring +1 assist to come to standing EOB with good compliance of all WB precautions. Assist needed to power up and to maintain balance once standing. Pt with good tolerance of LE therex for increased ROM and strength. Current plan remains appropriate to address deficits and maximize functional independence and decrease caregiver burden. Pt continues to benefit from skilled PT services to progress toward functional mobility goals.  ?  ?Recommendations for follow up therapy are one component of a multi-disciplinary discharge planning process, led by the attending physician.  Recommendations may be updated based on patient status, additional functional criteria and insurance authorization. ? ?Follow Up Recommendations ? Acute inpatient rehab (3hours/day) ?  ?  ?Assistance Recommended at Discharge Frequent or constant Supervision/Assistance  ?Patient can return home with the following Two people to help with walking and/or transfers;Two people to help with bathing/dressing/bathroom;Help with stairs or ramp for entrance;Assist for transportation;Assistance with cooking/housework ?  ?Equipment Recommendations ? Wheelchair cushion (measurements PT);Wheelchair (measurements PT)  ?  ?Recommendations for Other Services   ? ? ?  ?Precautions / Restrictions Precautions ?Precautions: Fall ?Required Braces or Orthoses: Other Brace ?Other Brace: post op  shoe ?Restrictions ?Weight Bearing Restrictions: Yes ?RLE Weight Bearing: Non weight bearing ?LLE Weight Bearing: Weight bearing as tolerated ?Other Position/Activity Restrictions: Per notes, unrestricted ROM  ?  ? ?Mobility ? Bed Mobility ?Overal bed mobility: Needs Assistance ?Bed Mobility: Supine to Sit, Sit to Supine ?  ?  ?Supine to sit: Min guard ?Sit to supine: Min assist ?  ?General bed mobility comments: pt needs trunk control once sitting at EOB as easliy fatigues ?  ? ?Transfers ?Overall transfer level: Needs assistance ?Equipment used: Rolling walker (2 wheels) ?Transfers: Sit to/from Stand ?Sit to Stand: Max assist ?  ?  ?  ?  ?  ?General transfer comment: max a to come to full standing x2 trials for ~ 10 seconds each trial, assist for power up, steadying, and to maintain WB precautions ?  ? ?Ambulation/Gait ?  ?  ?  ?  ?  ?  ?  ?  ? ? ?Stairs ?  ?  ?  ?  ?  ? ? ?Wheelchair Mobility ?  ? ?Modified Rankin (Stroke Patients Only) ?  ? ? ?  ?Balance Overall balance assessment: Needs assistance ?Sitting-balance support: Bilateral upper extremity supported, Single extremity supported, Feet supported ?Sitting balance-Leahy Scale: Fair ?  ?Postural control: Posterior lean ?Standing balance support: Bilateral upper extremity supported ?Standing balance-Leahy Scale: Poor ?Standing balance comment: heavy reliance on BUE ?  ?  ?  ?  ?  ?  ?  ?  ?  ?  ?  ?  ? ?  ?Cognition Arousal/Alertness: Awake/alert ?Behavior During Therapy: [facility] for tasks assessed/performed ?Overall Cognitive Status: Within Functional Limits for tasks assessed ?  ?  ?  ?  ?  ?  ?  ?  ?  ?  ?  ?  ?  ?  ?  ?  ?  ?  ?  ? ?  ?  Exercises General Exercises - Lower Extremity ?Long Arc Quad: AROM, Left, 10 reps, Seated ?Hip ABduction/ADduction: AROM, Both, 10 reps, Seated ?Hip Flexion/Marching: AROM, Both, 20 reps, Seated ?Other Exercises ?Other Exercises: pillow squeeze x10 ? ?  ?General Comments   ?  ?  ? ?Pertinent Vitals/Pain Pain Assessment ?Pain  Assessment: Faces ?Faces Pain Scale: Hurts a little bit ?Pain Location: RLE ?Pain Descriptors / Indicators: Aching, Grimacing, Guarding ?Pain Intervention(s): Limited activity within patient's tolerance, Monitored during session, Repositioned  ? ? ?Home Living   ?  ?  ?  ?  ?  ?  ?  ?  ?  ?   ?  ?Prior Function    ?  ?  ?   ? ?PT Goals (current goals can now be found in the care plan section) Acute Rehab PT Goals ?PT Goal Formulation: With patient ?Time For Goal Achievement: 01/20/22 ? ?  ?Frequency ? ? ? Min 5X/week ? ? ? ?  ?PT Plan    ? ? ?Co-evaluation   ?  ?  ?  ?  ? ?  ?AM-PAC PT "6 Clicks" Mobility   ?Outcome Measure ? Help needed turning from your back to your side while in a flat bed without using bedrails?: A Little ?Help needed moving from lying on your back to sitting on the side of a flat bed without using bedrails?: A Little ?Help needed moving to and from a bed to a chair (including a wheelchair)?: Total ?Help needed standing up from a chair using your arms (e.g., wheelchair or bedside chair)?: Total ?Help needed to walk in hospital room?: Total ?Help needed climbing 3-5 steps with a railing? : Total ?6 Click Score: 10 ? ?  ?End of Session Equipment Utilized During Treatment: Gait belt ?Activity Tolerance: Patient limited by fatigue ?Patient left: in bed;with call bell/phone within reach ?Nurse Communication: Mobility status ?PT Visit Diagnosis: Other abnormalities of gait and mobility (R26.89);Muscle weakness (generalized) (M62.81);Difficulty in walking, not elsewhere classified (R26.2) ?  ? ? ?Time: 0786-7544 ?PT Time Calculation (min) (ACUTE ONLY): 19 min ? ?Charges:  $Therapeutic Exercise: 8-22 mins          ?          ? ?Audry Riles. PTA ?Acute Rehabilitation Services ?Office: [phone] ? ? ? ?Betsey Holiday Jamarii Banks ?01/09/2022, 11:12 AM ? ?

## 2022-01-09 NOTE — TOC Progression Note (Signed)
Transition of Care (TOC) - Progression Note  ? ? ?Patient Details  ?Name: [name] [name] ?MRN: [id number] ?Date of Birth: May 16, 1951 ? ?Transition of Care (TOC) CM/SW Contact  ?Joanne Chars, LCSW ?Phone Number: ?01/09/2022, 8:28 AM ? ?Clinical Narrative:   CSW presented bed offers to pt, she requested that referral be sent to [facility] and [facility].  This was done, CSW reached out to both. ? ?1530: [facility] offers bed, no response [facility].  Pt updated, still would like response from [facility].   ? ? ? ?Expected Discharge Plan: [place] ?Barriers to Discharge: Continued Medical Work up, SNF Pending bed offer ? ?Expected Discharge Plan and Services ?Expected Discharge Plan: [place] ?In-house Referral: Clinical Social Work ?  ?Post Acute Care Choice: IP Rehab ?Living arrangements for the past 2 months: [place] ?                ?  ?  ?  ?  ?  ?  ?  ?  ?  ?  ? ? ?Social Determinants of Health (SDOH) Interventions ?  ? ?Readmission Risk Interventions ?   ? View : No data to display.  ?  ?  ?  ? ? ?

## 2022-01-09 NOTE — Progress Notes (Signed)
?Progress Note ? ? ?Patient: [name] [name] CHE:[id number] DOB: 1951/07/12 DOA: 01/01/2022     8 ?DOS: the patient was seen and examined on 01/09/2022 ?  ?Brief hospital course: ?Mrs. Campoy was admitted to the hospital with the working diagnosis of right tibial plateau fracture, now SP open reduction and internal fixation.  ? ?Patient is a 71 year old female with history of paroxysmal A-fib status post Maze procedure, mitral valve replacement with mechanical valve, on Coumadin, history of CVA, chronic diastolic CHF, hypertension who presented from home after a fall followed by right knee pain. She was not able to ambulate due to pain. On her initial physical examination her blood pressure was 156/68, HR 89, RR 15 and 02 saturation 99% on room air. Her lungs were clear to auscultation, heart with S1 and S2 present and rhythmic, abdomen soft, right leg with tender edema at the level of the knee.  ? ?Na 140, K 3.5 Cl 102. Bicarbonate 29, glucose 125. Bun 22 cr 0,73  ?Wbc 10,2 hgb 12,6 hct 36,8 plt 200  ?INR 2.9  ?Sars covid 19 negative  ? ?CT right lower extremity with non displaced intra-articular fracture anteriorly in the mid lower leg, involving both tibial plateaus. Distal extension of the fracture into the metadiaphysis.  ? ?Coumadin held, started on IV heparin, ?Orthopedics were consulted  ?Underwent ORIF 3/20, heparin/coumadin resumed postop. ? ?Pending therapeutic INR before patient gets transferred to SNF.  ? ?Assessment and Plan: ?* Tibial plateau fracture, right ?Tibial plateau fracture, proximal tibial fracture, fibular fracture, lateral malleolus fracture.  ?Sp ORIF ?Continue pain control and physical/ occupational therapy.  ?DVT prophylaxis  ?Plan to transfer to SNF.  ? ?CHF (congestive heart failure) ([place]) ?Normal LVEF and functioning MV prosthesis as of 2d echo in 2021. ?No clinical signs of exacerbation  ?Continue with lisinopril and metoprolol.  ?Patient not on maintenance diuretic therapy.   ? ?Essential hypertension ?Continue blood pressure control with lisinopril and metoprolol.  ? ?Hypothyroidism ?On levothyroxine  ? ?S/P mitral valve replacement ?Mechanical mitral valve.  ?Continue anticoagulation with warfarin and bridge with IV heparin. ?INR today is 1,8.  ?Target INR goal 2,5 to 3,5.  ?Follow pharmacy protocol.  ? ?Paroxysmal atrial fibrillation ([place]) ?Rate control with metoprolol and anticoagulation with heparin bridge to warfarin.   ? ? ?Normocytic anemia ?Stable hgb and hct  ? ?Malnutrition of moderate degree ?Continue with nutritional supplements.  ? ? ? ? ?  ? ?Subjective: patient is feeling well, with no nausea or vomiting, no dyspnea or chest pain  ? ?Physical Exam: ?Vitals:  ? 01/08/22 0840 01/08/22 1251 01/08/22 2040 01/09/22 0811  ?BP: (!) 103/53 (!) 115/49 139/62 (!) 106/57  ?Pulse: (!) 59 80 100 91  ?Resp: '17 17 18 17  '$ ?Temp: 97.6 ?F (36.4 ?C) 97.7 ?F (36.5 ?C) 98.1 ?F (36.7 ?C) 98.4 ?F (36.9 ?C)  ?TempSrc: Oral Oral Oral Oral  ?SpO2: 99% 100% 100% 97%  ?Weight:      ?Height:      ? ?Neurology awake and alert ?ENT with no pallor ?Cardiovascular with S1 and S2 present with no gallops ?Respiratory with no wheezing or rales ?Abdomen soft  ?No lower extremity edema  ?Data Reviewed: ? ? ? ?Family Communication: no family at the bedside  ? ?Disposition: ?Status is: Inpatient ?Remains inpatient appropriate because: pending INR therapeutic in order to transfer to SNF  ? Planned Discharge Destination: Skilled nursing facility ? ?Author: ?Tawni Millers, MD ?01/09/2022 12:58 PM ? ?For on call review [email or web address].  ?

## 2022-01-09 NOTE — Care Management Important Message (Signed)
Important Message ? ?Patient Details  ?Name: [name] [name] ?MRN: [id number] ?Date of Birth: 09-01-1951 ? ? ?Medicare Important Message Given:  Yes ? ? ? ? ?Levada Dy  Cherrish Vitali-Martin ?01/09/2022, 2:17 PM ?

## 2022-01-09 NOTE — Progress Notes (Addendum)
ANTICOAGULATION CONSULT NOTE - Follow Up Consult ? ?Pharmacy Consult for Heparin bridge to warfarin ?Indication:  mechanical mitral valve + AFib ? ?Allergies  ?Allergen Reactions  ? Erythromycin Nausea And Vomiting  ? Atorvastatin Other (See Comments)  ?  Pt reports causes body to be stiff, joints ache, aching in knees and ankles, feels more fatigued.   ? Lactose Intolerance (Gi) Other (See Comments)  ?  Upset stomach - yoghurt  ? Valsartan Other (See Comments)  ?  Pt reports caused her depression  ? ? ?Patient Measurements: ?Height: 5' 6.5" (168.9 cm) ?Weight: 65.8 kg (145 lb) ?IBW/kg (Calculated) : 60.45 ?Heparin Dosing Weight: 66 kg ? ?Vital Signs: ?Temp: 98.4 ?F (36.9 ?C) (03/24 2229) ?Temp Source: Oral (03/24 7989) ?BP: 106/57 (03/24 2119) ?Pulse Rate: 91 (03/24 0811) ? ?Labs: ?Recent Labs  ?  01/07/22 ?0717 01/08/22 ?0321 01/09/22 ?0405  ?HGB 10.3* 10.3* 10.6*  ?HCT 30.2* 29.5* 31.3*  ?PLT 251 283 348  ?LABPROT 16.9* 18.7* 21.3*  ?INR 1.4* 1.6* 1.8*  ?HEPARINUNFRC 0.38 0.48 0.48  ?CREATININE 0.72  --   --   ? ? ? ?Estimated Creatinine Clearance: 60.7 mL/min (by C-G formula based on SCr of 0.72 mg/dL). ? ?Assessment: ?71 year old female with a mechanical mitral valve and AFib s/p ORIF on 01/05/22 for fracture of R-knee after fall. Pharmacy consulted to resume and manage heparin bridge to warfarin post-op. ? ?Heparin level 0.55, therapeutic ?INR 2.3, subtherapeutic but rising appropriately  ?H/H/Plt stable ?No s/sx of bleeding ?  ?PTA Warfarin regimen:  7.5 mg Sun/Tues/Thurs + 5 mg Mon/Wed/Fri/Sat ? ?Goal of Therapy:  ?Heparin level 0.3-0.7 units/ml ?INR 2.5-3.5 ?Monitor platelets by anticoagulation protocol: Yes ?  ?Plan:  ?Continue heparin infusion at therapeutic rate of 1250 units/hr  ?Warfarin 7.'5mg'$  po x1  ? ?Heparin level and PT/INR daily with AM labs ?CBC daily  ?Continue to monitor for s/sx of bleeding. ? ?Luisa Hart, PharmD, BCPS ?Clinical Pharmacist ?01/09/2022 9:25 AM  ? ?Please refer to [facility] for  pharmacy phone number  ?

## 2022-01-09 NOTE — Progress Notes (Signed)
Nutrition Follow-up ? ?DOCUMENTATION CODES:  ?Non-severe (moderate) malnutrition in context of chronic illness ? ?INTERVENTION:  ?-continue regular diet  ?-continue Ensure Enlive po BID, each supplement provides 350 kcal and 20 grams of protein. ?-continue to encourage PO intake ? ?NUTRITION DIAGNOSIS:  ?Moderate Malnutrition related to chronic illness (CHF, COPD) as evidenced by moderate fat depletion, moderate muscle depletion. -- ongoing ? ?GOAL:  ?Patient will meet greater than or equal to 90% of their needs -- progressing ? ?MONITOR:  ?PO intake, Supplement acceptance, Labs, Weight trends ? ?REASON FOR ASSESSMENT:  ?Consult ?Hip fracture protocol ? ?ASSESSMENT:  ?71 year old female who presented to the ED on 3/16 after a fall. PMH of mitral valve replacement, CHF, COPD, IBS, multiple sclerosis, atrial fibrillation. Pt admitted with tibial plateau fracture, proximal tibial fracture, fibular fracture, and lateral malleolus fracture. ? ?3/20 - s/p ORIF ? ?Pt now pending therapeutic INR before being transferred to SNF; target INR goal of 2.5-3.5 per MD.  ? ?Pt reports feeling much better, endorses good pain pain control and improved mobility. PO intake has been documented as 75-100% x 5 recorded meals. Pt doing well with ONS per RN. Recommend continue current nutrition plan of care.  ? ?UOP: 4x unmeasured occurrences x24 hours ?I/O: +243m since admit ? ?Current weight: 65.8 kg ?Admit weight: 66.6 kg  ? ?Edema: non-pitting edema to RLE per RN assessment ? ?Medications: ? calcium carbonate  1,250 mg Oral Q breakfast  ? cholecalciferol  2,000 Units Oral QODAY  ? cholecalciferol  4,000 Units Oral QODAY  ? docusate sodium  100 mg Oral Daily  ? feeding supplement  237 mL Oral BID BM  ? magnesium oxide  200 mg Oral Daily  ? warfarin  7.5 mg Oral ONCE-1600  ? Warfarin - Pharmacist Dosing Inpatient   Does not apply [id number] ? ?Labs: ?Recent Labs  ?Lab 01/05/22 ?0559703/21/23 ?0416303/22/23 ?08453 ?NA 133* 135 136  ?K 3.9  4.4 4.1  ?CL 100 102 100  ?CO2 '25 27 27  '$ ?BUN 23 24* 28*  ?CREATININE 0.71 0.65 0.72  ?CALCIUM 8.7* 9.1 9.1  ?GLUCOSE 138* 132* 103*  ?Vitamin D WNL ?Hgb 10.6 (trending up) ?INR 1.8 (trending up) ? ?Diet Order:   ?Diet Order   ? ?       ?  Diet regular Room service appropriate? Yes; Fluid consistency: Thin  Diet effective now       ?  ? ?  ?  ? ?  ? ?EDUCATION NEEDS:  ?Education needs have been addressed ? ?Skin:  Skin Assessment: Skin Integrity Issues: ?Skin Integrity Issues:: Incisions ?Incisions: R leg ? ?Last BM:  3/24 ? ?Height:  ?Ht Readings from Last 1 Encounters:  ?01/05/22 5' 6.5" (1.689 m)  ? ?Weight:  ?Wt Readings from Last 1 Encounters:  ?01/05/22 65.8 kg  ? ?BMI:  Body mass index is 23.05 kg/m?. ? ?Estimated Nutritional Needs:  ?Kcal:  1700-1900 ?Protein:  85-100 grams ?Fluid:  1.7-1.9 L ? ? ?ATheone Stanley, MS, RD, LDN (she/her/hers) ?RD pager number and weekend/on-call pager number located in [place] ?

## 2022-01-09 NOTE — TOC Progression Note (Signed)
Transition of Care (TOC) - Progression Note  ? ? ?Patient Details  ?Name: [name] [name] ?MRN: [id number] ?Date of Birth: Jul 16, 1951 ? ?Transition of Care (TOC) CM/SW Contact  ?Joanne Chars, LCSW ?Phone Number: ?01/09/2022, 1:08 PM ? ?Clinical Narrative:   CSW made another attempt to reach [facility], no response.  CSW spoke with pt who does want to accept offer at [facility].  Confirmed with [facility], who could potentially take her over the weekend, if pt is cleared for DC, but no promises.  [facility] would be weekend contact to pursue this.   ? ? ? ?Expected Discharge Plan: [place] ?Barriers to Discharge: Continued Medical Work up, SNF Pending bed offer ? ?Expected Discharge Plan and Services ?Expected Discharge Plan: [place] ?In-house Referral: Clinical Social Work ?  ?Post Acute Care Choice: IP Rehab ?Living arrangements for the past 2 months: [place] ?                ?  ?  ?  ?  ?  ?  ?  ?  ?  ?  ? ? ?Social Determinants of Health (SDOH) Interventions ?  ? ?Readmission Risk Interventions ?   ? View : No data to display.  ?  ?  ?  ? ? ?

## 2022-01-10 LAB — CBC
HCT: 31.5 % — ABNORMAL LOW (ref 36.0–46.0)
Hemoglobin: 10.7 g/dL — ABNORMAL LOW (ref 12.0–15.0)
MCH: 32.6 pg (ref 26.0–34.0)
MCHC: 34 g/dL (ref 30.0–36.0)
MCV: 96 fL (ref 80.0–100.0)
Platelets: 335 10*3/uL (ref 150–400)
RBC: 3.28 MIL/uL — ABNORMAL LOW (ref 3.87–5.11)
RDW: 13.9 % (ref 11.5–15.5)
WBC: 11 10*3/uL — ABNORMAL HIGH (ref 4.0–10.5)
nRBC: 0 % (ref 0.0–0.2)

## 2022-01-10 LAB — PROTIME-INR
INR: 2.3 — ABNORMAL HIGH (ref 0.8–1.2)
Prothrombin Time: 25.6 seconds — ABNORMAL HIGH (ref 11.4–15.2)

## 2022-01-10 LAB — HEPARIN LEVEL (UNFRACTIONATED): Heparin Unfractionated: 0.55 IU/mL (ref 0.30–0.70)

## 2022-01-10 MED ORDER — WARFARIN SODIUM 7.5 MG PO TABS
7.5000 mg | ORAL_TABLET | Freq: Once | ORAL | Status: AC
  Administered 2022-01-10: 7.5 mg via ORAL
  Filled 2022-01-10: qty 1

## 2022-01-10 NOTE — Progress Notes (Signed)
?Progress Note ? ? ?Patient: [name] [name] WUJ:[id number] DOB: June 13, 1951 DOA: 01/01/2022     9 ?DOS: the patient was seen and examined on 01/10/2022 ?  ?Brief hospital course: ?Mrs. Macnaughton was admitted to the hospital with the working diagnosis of right tibial plateau fracture, now SP open reduction and internal fixation.  ? ?Patient is a 71 year old female with history of paroxysmal A-fib status post Maze procedure, mitral valve replacement with mechanical valve, on Coumadin, history of CVA, chronic diastolic CHF, hypertension who presented from home after a fall followed by right knee pain. She was not able to ambulate due to pain. On her initial physical examination her blood pressure was 156/68, HR 89, RR 15 and 02 saturation 99% on room air. Her lungs were clear to auscultation, heart with S1 and S2 present and rhythmic, abdomen soft, right leg with tender edema at the level of the knee.  ? ?Na 140, K 3.5 Cl 102. Bicarbonate 29, glucose 125. Bun 22 cr 0,73  ?Wbc 10,2 hgb 12,6 hct 36,8 plt 200  ?INR 2.9  ?Sars covid 19 negative  ? ?CT right lower extremity with non displaced intra-articular fracture anteriorly in the mid lower leg, involving both tibial plateaus. Distal extension of the fracture into the metadiaphysis.  ? ?Coumadin held, started on IV heparin, ?Orthopedics were consulted  ?Underwent ORIF 3/20, heparin/coumadin resumed postop. ? ?Pending therapeutic INR before patient gets transferred to SNF.  ? ?Assessment and Plan: ?* Tibial plateau fracture, right ?Tibial plateau fracture, proximal tibial fracture, fibular fracture, lateral malleolus fracture.  ?Sp ORIF ?Pain is controlled with analgesics.  ?DVT prophylaxis with warfarin and heparin bridge.  ?Plan to transfer to SNF.  ? ?CHF (congestive heart failure) ([place]) ?Normal LVEF and functioning MV prosthesis as of 2d echo in 2021. ?No clinical signs of exacerbation  ?Continue with lisinopril and metoprolol.  ?Patient not on maintenance diuretic  therapy.  ? ?Essential hypertension ?Continue blood pressure control with lisinopril and metoprolol.  ?Systolic blood pressure around 120 mmHg.  ? ?Hypothyroidism ?On levothyroxine  ? ?S/P mitral valve replacement ?Mechanical mitral valve.  ?Continue anticoagulation with warfarin and bridge with IV heparin. ?INR today is 2,3   ?Target INR goal 2,5 to 3,5.  ?Follow pharmacy protocol.  ? ?Paroxysmal atrial fibrillation ([place]) ?On metoprolol for rate control and anticoagulation with heparin bridge to warfarin.   ? ? ?Normocytic anemia ?Stable hgb and hct at 10,7 and hct at 31,5  ?Follow per protocol, while patient on IV heparin.  ? ?Malnutrition of moderate degree ?Continue with nutritional supplements.  ? ? ? ? ?  ? ?Subjective: patient is feeling better, pain at her right lower extremity is controlled with analgesics, no dyspnea or chest pain  ? ?Physical Exam: ?Vitals:  ? 01/09/22 9562 01/09/22 1617 01/09/22 2021 01/10/22 0748  ?BP: (!) 106/57 (!) 116/56 (!) 128/53 (!) 118/57  ?Pulse: 91 87 87 86  ?Resp: '17 16 18 16  '$ ?Temp: 98.4 ?F (36.9 ?C) 98 ?F (36.7 ?C) 98 ?F (36.7 ?C) 97.9 ?F (36.6 ?C)  ?TempSrc: Oral Oral Oral Oral  ?SpO2: 97% 98% 97% 97%  ?Weight:      ?Height:      ? ?Neurology awake and alert ?ENT with no pallor ?Cardiovascular with S1 and S2 present and no gallop ?Respiratory with no wheezing ?Abdomen not distended ?No lower extremity edema  ?Data Reviewed: ? ? ? ?Family Communication: no family at the bedside  ? ?Disposition: ?Status is: Inpatient ?Remains inpatient appropriate because: pending transfer  to SNF  ? Planned Discharge Destination: Home ? ? ? ?Author: ?Tawni Millers, MD ?01/10/2022 4:24 PM ? ?For on call review [email or web address].  ?

## 2022-01-11 LAB — CBC
HCT: 29.5 % — ABNORMAL LOW (ref 36.0–46.0)
Hemoglobin: 10.1 g/dL — ABNORMAL LOW (ref 12.0–15.0)
MCH: 32.5 pg (ref 26.0–34.0)
MCHC: 34.2 g/dL (ref 30.0–36.0)
MCV: 94.9 fL (ref 80.0–100.0)
Platelets: 388 10*3/uL (ref 150–400)
RBC: 3.11 MIL/uL — ABNORMAL LOW (ref 3.87–5.11)
RDW: 14.1 % (ref 11.5–15.5)
WBC: 11.6 10*3/uL — ABNORMAL HIGH (ref 4.0–10.5)
nRBC: 0 % (ref 0.0–0.2)

## 2022-01-11 LAB — PROTIME-INR
INR: 2.7 — ABNORMAL HIGH (ref 0.8–1.2)
Prothrombin Time: 28.9 seconds — ABNORMAL HIGH (ref 11.4–15.2)

## 2022-01-11 LAB — HEPARIN LEVEL (UNFRACTIONATED): Heparin Unfractionated: 0.4 IU/mL (ref 0.30–0.70)

## 2022-01-11 MED ORDER — WARFARIN SODIUM 5 MG PO TABS
5.0000 mg | ORAL_TABLET | Freq: Once | ORAL | Status: AC
  Administered 2022-01-11: 5 mg via ORAL
  Filled 2022-01-11: qty 1

## 2022-01-11 NOTE — Progress Notes (Signed)
ANTICOAGULATION CONSULT NOTE - Follow Up Consult ? ?Pharmacy Consult for Heparin bridge to warfarin ?Indication:  mechanical mitral valve + AFib ? ?Allergies  ?Allergen Reactions  ? Erythromycin Nausea And Vomiting  ? Atorvastatin Other (See Comments)  ?  Pt reports causes body to be stiff, joints ache, aching in knees and ankles, feels more fatigued.   ? Lactose Intolerance (Gi) Other (See Comments)  ?  Upset stomach - yoghurt  ? Valsartan Other (See Comments)  ?  Pt reports caused her depression  ? ? ?Patient Measurements: ?Height: 5' 6.5" (168.9 cm) ?Weight: 65.8 kg (145 lb) ?IBW/kg (Calculated) : 60.45 ?Heparin Dosing Weight: 66 kg ? ?Vital Signs: ?Temp: 98.4 ?F (36.9 ?C) (03/26 0447) ?Temp Source: Oral (03/26 0447) ?BP: 142/64 (03/26 0447) ?Pulse Rate: 87 (03/26 0447) ? ?Labs: ?Recent Labs  ?  01/09/22 ?0405 01/10/22 ?0216 01/11/22 ?0345  ?HGB 10.6* 10.7* 10.1*  ?HCT 31.3* 31.5* 29.5*  ?PLT 348 335 388  ?LABPROT 21.3* 25.6* 28.9*  ?INR 1.8* 2.3* 2.7*  ?HEPARINUNFRC 0.48 0.55 0.40  ? ? ? ?Estimated Creatinine Clearance: 60.7 mL/min (by C-G formula based on SCr of 0.72 mg/dL). ? ?Assessment: ?71 year old female with a mechanical mitral valve and AFib s/p ORIF on 01/05/22 for fracture of R-knee after fall. Pharmacy consulted to resume and manage heparin bridge to warfarin post-op. ? ?Heparin level 0.40, therapeutic ?INR 2.7, therapeutic  ?H/H/Plt stable ?No s/sx of bleeding ?  ?PTA Warfarin regimen:  7.5 mg Sun/Tues/Thurs + 5 mg Mon/Wed/Fri/Sat ? ?Goal of Therapy:  ?Heparin level 0.3-0.7 units/ml ?INR 2.5-3.5 ?Monitor platelets by anticoagulation protocol: Yes ?  ?Plan:  ?Discontinue heparin infusion ?Warfarin '5mg'$  po x1  ? ?PT/INR daily with AM labs ?CBC daily  ?Continue to monitor for s/sx of bleeding. ? ?Luisa Hart, PharmD, BCPS ?Clinical Pharmacist ?01/11/2022 8:17 AM  ? ?Please refer to [facility] for pharmacy phone number  ?

## 2022-01-11 NOTE — Discharge Summary (Addendum)
?Physician Discharge Summary ?  ?Patient: [name] [name] MRN: [id number] DOB: 1951-06-11  ?Admit date:     01/01/2022  ?Discharge date: 01/12/2022   ?Discharge Physician: Jimmy Picket Sharona Rovner  ? ?PCP: Raoul Pitch, Renee A, DO  ? ?Recommendations at discharge:  ? ? Patient will continue warfarin for anticoagulation with target INR 2.5 to 3.5 for mitral mechanical valve. ?Follow up INR per protocol, her discharge INR is 3,5  ?Non weight bearing right leg for 6 to 8 weeks. ?Unrestrictive ROM right knee.  ?Ice and elevate for pain control. ?Surgical wound dressing change as needed with new mepilex.  ?Float heels off bed. ? ?PT- please teach HEP for R knee ROM- AROM, PROM. No ROM restrictions.  Quad sets, SLR, LAQ, SAQ, heel slides, stretching ?Ankle theraband program, heel cord stretching, toe towel curls, etc ?No pillows under bend of knee when at rest, ok to place under heel to help work on extension. Can also use zero knee bone foam if available ? ? ?Discharge Diagnoses: ?Principal Problem: ?  Tibial plateau fracture, right ?Active Problems: ?  CHF (congestive heart failure) ([place]) ?  Essential hypertension ?  Hypothyroidism ?  S/P mitral valve replacement ?  Paroxysmal atrial fibrillation ([facility]) ?  Normocytic anemia ?  Malnutrition of moderate degree ? ?Resolved Problems: ?  * No resolved hospital problems. * ? ?Hospital Course: ?[name] [name] was admitted to the hospital with the working diagnosis of right tibial plateau fracture, now SP open reduction and internal fixation.  ? ?Patient is a 71 year old female with history of paroxysmal A-fib status post Maze procedure, mitral valve replacement with mechanical valve, on Coumadin, history of CVA, chronic diastolic CHF, hypertension who presented from home after a fall followed by right knee pain. She was not able to ambulate due to pain. On her initial physical examination her blood pressure was 156/68, HR 89, RR 15 and 02 saturation 99% on room air. Her lungs were clear  to auscultation, heart with S1 and S2 present and rhythmic, abdomen soft, right leg with tender edema at the level of the knee.  ? ?Na 140, K 3.5 Cl 102. Bicarbonate 29, glucose 125. Bun 22 cr 0,73  ?Wbc 10,2 hgb 12,6 hct 36,8 plt 200  ?INR 2.9  ?Sars covid 19 negative  ? ?CT right lower extremity with non displaced intra-articular fracture anteriorly in the mid lower leg, involving both tibial plateaus. Distal extension of the fracture into the metadiaphysis.  ? ?Coumadin held, started on IV heparin, ?Orthopedics were consulted  ?Underwent ORIF 3/20, heparin/coumadin resumed postop. ? ?Pending therapeutic INR before patient gets transferred to SNF.  ? ?Assessment and Plan: ?* Tibial plateau fracture, right ?Tibial plateau fracture, proximal tibial fracture, fibular fracture, lateral malleolus fracture.  ? ?Patient was admitted to the medical ward, she received analgesics, and DVT prophylaxis ? ?Orthopedics was consulted  ?Patient underwent  ORIF on 01/05/22 with good toleration.  ?She was found to have right knee hemarthrosis, and joint was aspirated during surgical procedure.  ? ?Post operative her pain remained well controlled, she was evaluated by physical and occupational therapy with recommendations to continue therapy at SNF.  ? ?CHF (congestive heart failure) ([place]) ?Normal LVEF and functioning MV prosthesis as of 2d echo in 2021. ?No clinical signs of exacerbation  ?Continue with lisinopril and metoprolol.  ?Patient not on maintenance diuretic therapy.  ? ?Essential hypertension ?Continue blood pressure control with lisinopril and metoprolol.  ?Her blood pressure remained well controlled during her hospitalization.  ? ? ?  Hypothyroidism ?Continue with levothyroxine  ? ?S/P mitral valve replacement ?Mechanical mitral valve.  ?Patient was placed on IV heparin for bridge anticoagulation with good toleration. ?Post procedure warfarin was resumed and INR was closely followed up.  ? ?At the time of her discharge  her INR is therapeutic at 3.5.  ? ?Target INR is 2.5 to 3.5., plan to continue warfarin and close follow up on INR.  ? ?Paroxysmal atrial fibrillation ([place]) ?Her heart rate remained well controlled with metoprolol for rate control. ?Continue anticoagulation with warfarin.  ? ?Normocytic anemia ?Stable hgb and hct at 10,7 and hct at 31,5  ?Follow cell count as outpatient.  ? ?Malnutrition of moderate degree ?Continue with nutritional supplements.  ? ? ? ? ?  ? ? ?Consultants: orthopedics  ?Procedures performed:  ?1. OPEN REDUCTION INTERNAL FIXATION (ORIF) RIGHT BICONDYLAR TIBIAL PLATEAU ?2. OPEN TREATMENT OF TIBIAL SPINE/ EMINENCE FRACTURE RIGHT  ?3. OPEN TREATMENT OF TIBIAL SHAFT FRACTURE RIGHT  ?4. ANTERIOR COMPARTMENT FASCIOTOMY ?5. OPEN REDUCTION INTERNAL FIXATION (ORIF) RIGHT LATERAL MALLEOLUS ?6. ASPIRATION OF RIGHT  KNEE HEMARTHROSIS ? ?  ?Disposition: Skilled nursing facility ?Diet recommendation:  ?Regular diet ?DISCHARGE MEDICATION: ?Allergies as of 01/12/2022   ? ?   Reactions  ? Erythromycin Nausea And Vomiting  ? Atorvastatin Other (See Comments)  ? Pt reports causes body to be stiff, joints ache, aching in knees and ankles, feels more fatigued.   ? Lactose Intolerance (gi) Other (See Comments)  ? Upset stomach - yoghurt  ? Valsartan Other (See Comments)  ? Pt reports caused her depression  ? ?  ? ?  ?Medication List  ?  ? ?TAKE these medications   ? ?acetaminophen 500 MG tablet ?Commonly known as: TYLENOL ?Take 1,000 mg by mouth every 6 (six) hours as needed for moderate pain. ?  ?Armour Thyroid 90 MG tablet ?Generic drug: thyroid ?Take 1 tablet (90 mg total) by mouth daily. ?  ?Biotin 5000 MCG Caps ?Take 10,000 mcg by mouth in the morning and at bedtime. ?  ?Black Cohosh 40 MG Caps ?Take 40 mg by mouth daily. ?  ?BROMELAIN PO ?Take 1 capsule by mouth 2 (two) times daily. ?  ?calcium carbonate 1500 (600 Ca) MG Tabs tablet ?Commonly known as: OSCAL ?Take 1,500 mg by mouth daily. ?  ?Co Q-10 100 MG  Caps ?Take 100 mg by mouth daily. ?  ?Cranberry 500 MG Caps ?Take 500 mg by mouth 2 (two) times daily. ?  ?fluticasone 50 MCG/ACT nasal spray ?Commonly known as: FLONASE ?Place 1 spray into both nostrils daily. ?  ?GLUCOSAMINE CHOND COMPLEX/MSM PO ?Take 1 tablet by mouth in the morning and at bedtime. Strength of dose Glucosamine '1500mg'$  /Chodroitron '1000mg'$ / MSM '500mg'$  takes one twice daily ?  ?L-THEANINE PO ?Take 1 capsule by mouth every evening. ?  ?lactose free nutrition Liqd ?Take 237 mLs by mouth 2 (two) times daily. ?  ?lidocaine-prilocaine cream ?Commonly known as: EMLA ?Apply 1 application. topically daily as needed (per pt this is for use before blood draws). ?  ?lisinopril 5 MG tablet ?Commonly known as: ZESTRIL ?Take 1 tablet (5 mg total) by mouth daily. ?  ?Lysine 500 MG Caps ?Take 1,000 mg by mouth 2 (two) times daily. ?  ?Magnesium Citrate 200 MG Tabs ?Take 100 mg by mouth daily. ?  ?meclizine 12.5 MG tablet ?Commonly known as: ANTIVERT ?Take 1 tablet (12.5 mg total) by mouth 2 (two) times daily as needed for dizziness. ?  ?metoprolol tartrate 25 MG tablet ?Commonly  known as: LOPRESSOR ?TAKE 1 TAB (25 MG) PO TWICE DAILY - pt must make appt with provider for further refills - 1st attempt ?What changed:  ?how much to take ?how to take this ?when to take this ?additional instructions ?  ?MILK THISTLE PO ?Take 1 capsule by mouth daily. ?  ?Olive Leaf 500 MG Caps ?Take 1 capsule by mouth 2 (two) times daily. ?  ?Omega 3 1200 MG Caps ?Take 1,200 mg by mouth 2 (two) times daily. ?  ?OVER THE COUNTER MEDICATION ?Take 1 capsule by mouth 2 (two) times daily. [place] ?  ?PETADOLEX PO ?Take 1 tablet by mouth 2 (two) times daily. ?  ?PROBIOTIC DAILY PO ?Take 1 capsule by mouth daily. ?  ?PROGESTERONE MICRONIZED PO ?Take 100 mg by mouth daily. ?  ?Pumpkin Seed 500 MG Tabs ?Take 500 mg by mouth 2 (two) times daily. ?  ?TURMERIC PO ?Take 500 mg by mouth every evening. ?  ?vitamin A 10000 UNIT capsule ?Take 10,000  Units by mouth every evening. ?  ?vitamin B-12 1000 MCG tablet ?Commonly known as: CYANOCOBALAMIN ?Take 1,000 mcg by mouth daily. Taking sublingal ?  ?Vitamin D3 50 MCG (2000 UT) capsule ?Take 2,000-4,000 Units by

## 2022-01-11 NOTE — TOC Progression Note (Signed)
Transition of Care (TOC) - Progression Note  ? ? ?Patient Details  ?Name: [name] [name] ?MRN: [id number] ?Date of Birth: 25-Jul-1951 ? ?Transition of Care (TOC) CM/SW Contact  ?[place], [place], [place] ?Phone Number: [phone] ?01/11/2022, 10:03 AM ? ?Clinical Narrative:    ?Phone call to [facility] admissions coordinated at [facility]. Confirmed admission for Monday. [facility]oy will meet with patient at bedside to complete admissions paper work.  Expected time of arrival 12pm. ? ?[employer], LCSW ?Transition of Care ?[phone] ? ? ? ?Expected Discharge Plan: [place] ?Barriers to Discharge: Continued Medical Work up, SNF Pending bed offer ? ?Expected Discharge Plan and Services ?Expected Discharge Plan: [place] ?In-house Referral: Clinical Social Work ?  ?Post Acute Care Choice: IP Rehab ?Living arrangements for the past 2 months: [place] ?                ?  ?  ?  ?  ?  ?  ?  ?  ?  ?  ? ? ?Social Determinants of Health (SDOH) Interventions ?  ? ?Readmission Risk Interventions ?   ? View : No data to display.  ?  ?  ?  ? ? ?

## 2022-01-11 NOTE — TOC Progression Note (Signed)
Transition of Care (TOC) - Progression Note  ? ? ?Patient Details  ?Name: [name] [name] ?MRN: [id number] ?Date of Birth: 1951/03/06 ? ?Transition of Care (TOC) CM/SW Contact  ?[place], [place], [place] ?Phone Number: ?01/11/2022, 9:47 AM ? ?Clinical Narrative:    ?Voicemail left for intake coordinator [facility] at [facility] to discuss admission. ? ?Transition of Care to continue to follow ? ? ?Eulalio Reamy, LCSW ?Transition of Care ?[phone] ? ? ? ?Expected Discharge Plan: [place] ?Barriers to Discharge: Continued Medical Work up, SNF Pending bed offer ? ?Expected Discharge Plan and Services ?Expected Discharge Plan: [place] ?In-house Referral: Clinical Social Work ?  ?Post Acute Care Choice: IP Rehab ?Living arrangements for the past 2 months: [place] ?                ?  ?  ?  ?  ?  ?  ?  ?  ?  ?  ? ? ?Social Determinants of Health (SDOH) Interventions ?  ? ?Readmission Risk Interventions ?   ? View : No data to display.  ?  ?  ?  ? ? ?

## 2022-01-12 DIAGNOSIS — K625 Hemorrhage of anus and rectum: Secondary | ICD-10-CM | POA: Diagnosis not present

## 2022-01-12 DIAGNOSIS — Z7901 Long term (current) use of anticoagulants: Secondary | ICD-10-CM | POA: Diagnosis not present

## 2022-01-12 DIAGNOSIS — I639 Cerebral infarction, unspecified: Secondary | ICD-10-CM | POA: Diagnosis not present

## 2022-01-12 DIAGNOSIS — N951 Menopausal and female climacteric states: Secondary | ICD-10-CM | POA: Diagnosis not present

## 2022-01-12 DIAGNOSIS — K5641 Fecal impaction: Secondary | ICD-10-CM | POA: Diagnosis not present

## 2022-01-12 DIAGNOSIS — Z8673 Personal history of transient ischemic attack (TIA), and cerebral infarction without residual deficits: Secondary | ICD-10-CM | POA: Diagnosis not present

## 2022-01-12 DIAGNOSIS — Z952 Presence of prosthetic heart valve: Secondary | ICD-10-CM | POA: Diagnosis not present

## 2022-01-12 DIAGNOSIS — M25061 Hemarthrosis, right knee: Secondary | ICD-10-CM | POA: Diagnosis not present

## 2022-01-12 DIAGNOSIS — Z4789 Encounter for other orthopedic aftercare: Secondary | ICD-10-CM | POA: Diagnosis not present

## 2022-01-12 DIAGNOSIS — L89891 Pressure ulcer of other site, stage 1: Secondary | ICD-10-CM | POA: Diagnosis not present

## 2022-01-12 DIAGNOSIS — Z79899 Other long term (current) drug therapy: Secondary | ICD-10-CM | POA: Diagnosis not present

## 2022-01-12 DIAGNOSIS — D508 Other iron deficiency anemias: Secondary | ICD-10-CM | POA: Diagnosis not present

## 2022-01-12 DIAGNOSIS — K921 Melena: Secondary | ICD-10-CM | POA: Diagnosis not present

## 2022-01-12 DIAGNOSIS — L89610 Pressure ulcer of right heel, unstageable: Secondary | ICD-10-CM | POA: Diagnosis not present

## 2022-01-12 DIAGNOSIS — D649 Anemia, unspecified: Secondary | ICD-10-CM | POA: Diagnosis not present

## 2022-01-12 DIAGNOSIS — S82141D Displaced bicondylar fracture of right tibia, subsequent encounter for closed fracture with routine healing: Secondary | ICD-10-CM | POA: Diagnosis not present

## 2022-01-12 DIAGNOSIS — I10 Essential (primary) hypertension: Secondary | ICD-10-CM | POA: Diagnosis not present

## 2022-01-12 DIAGNOSIS — Z7401 Bed confinement status: Secondary | ICD-10-CM | POA: Diagnosis not present

## 2022-01-12 DIAGNOSIS — M25461 Effusion, right knee: Secondary | ICD-10-CM | POA: Diagnosis not present

## 2022-01-12 DIAGNOSIS — E038 Other specified hypothyroidism: Secondary | ICD-10-CM | POA: Diagnosis not present

## 2022-01-12 DIAGNOSIS — S82111D Displaced fracture of right tibial spine, subsequent encounter for closed fracture with routine healing: Secondary | ICD-10-CM | POA: Diagnosis not present

## 2022-01-12 DIAGNOSIS — E440 Moderate protein-calorie malnutrition: Secondary | ICD-10-CM

## 2022-01-12 DIAGNOSIS — S8990XA Unspecified injury of unspecified lower leg, initial encounter: Secondary | ICD-10-CM | POA: Diagnosis not present

## 2022-01-12 DIAGNOSIS — Z853 Personal history of malignant neoplasm of breast: Secondary | ICD-10-CM | POA: Diagnosis not present

## 2022-01-12 DIAGNOSIS — S8264XD Nondisplaced fracture of lateral malleolus of right fibula, subsequent encounter for closed fracture with routine healing: Secondary | ICD-10-CM | POA: Diagnosis not present

## 2022-01-12 DIAGNOSIS — I480 Paroxysmal atrial fibrillation: Secondary | ICD-10-CM | POA: Diagnosis not present

## 2022-01-12 DIAGNOSIS — E039 Hypothyroidism, unspecified: Secondary | ICD-10-CM | POA: Diagnosis not present

## 2022-01-12 DIAGNOSIS — L89611 Pressure ulcer of right heel, stage 1: Secondary | ICD-10-CM | POA: Diagnosis not present

## 2022-01-12 DIAGNOSIS — J449 Chronic obstructive pulmonary disease, unspecified: Secondary | ICD-10-CM | POA: Diagnosis not present

## 2022-01-12 DIAGNOSIS — S82154D Nondisplaced fracture of right tibial tuberosity, subsequent encounter for closed fracture with routine healing: Secondary | ICD-10-CM | POA: Diagnosis not present

## 2022-01-12 DIAGNOSIS — I5032 Chronic diastolic (congestive) heart failure: Secondary | ICD-10-CM | POA: Diagnosis not present

## 2022-01-12 DIAGNOSIS — Z959 Presence of cardiac and vascular implant and graft, unspecified: Secondary | ICD-10-CM | POA: Diagnosis not present

## 2022-01-12 DIAGNOSIS — I959 Hypotension, unspecified: Secondary | ICD-10-CM | POA: Diagnosis not present

## 2022-01-12 DIAGNOSIS — K922 Gastrointestinal hemorrhage, unspecified: Secondary | ICD-10-CM | POA: Diagnosis not present

## 2022-01-12 DIAGNOSIS — S82141A Displaced bicondylar fracture of right tibia, initial encounter for closed fracture: Secondary | ICD-10-CM | POA: Diagnosis not present

## 2022-01-12 DIAGNOSIS — D75839 Thrombocytosis, unspecified: Secondary | ICD-10-CM | POA: Diagnosis not present

## 2022-01-12 DIAGNOSIS — R319 Hematuria, unspecified: Secondary | ICD-10-CM | POA: Diagnosis not present

## 2022-01-12 DIAGNOSIS — S92352D Displaced fracture of fifth metatarsal bone, left foot, subsequent encounter for fracture with routine healing: Secondary | ICD-10-CM | POA: Diagnosis not present

## 2022-01-12 DIAGNOSIS — I509 Heart failure, unspecified: Secondary | ICD-10-CM | POA: Diagnosis not present

## 2022-01-12 DIAGNOSIS — I110 Hypertensive heart disease with heart failure: Secondary | ICD-10-CM | POA: Diagnosis not present

## 2022-01-12 DIAGNOSIS — M25561 Pain in right knee: Secondary | ICD-10-CM | POA: Diagnosis not present

## 2022-01-12 DIAGNOSIS — R58 Hemorrhage, not elsewhere classified: Secondary | ICD-10-CM | POA: Diagnosis not present

## 2022-01-12 LAB — PROTIME-INR
INR: 3.5 — ABNORMAL HIGH (ref 0.8–1.2)
Prothrombin Time: 34.8 seconds — ABNORMAL HIGH (ref 11.4–15.2)

## 2022-01-12 NOTE — Progress Notes (Signed)
Occupational Therapy Treatment ?Patient Details ?Name: [name] [name] ?MRN: [id number] ?DOB: 14-Oct-1951 ?Today's Date: 01/12/2022 ? ? ?History of present illness Pt is a 71 yr old female who presented 3/16 due to a fall. A-ray showed  tibia plateau, tibia shaft, and lat malleolus fxs.S/p R ORIF on 3/20.  Pt also with  Acute minimally displaced fracture of the fifth metatarsal on the L.  PMH: PAF s/p maze, MVR a/p replacement valve, multiple strokes, HTN, HFpEF, breast ca, COPD, ?  ?OT comments ? Pt making slow progress towards goals this session, able to attempt standing x1 during session, pt unable to clear hips despite max A with RW. Pt min guard for bed mobility, and donning post-op shoe at EOB. Pt declining further standing attempts due to fatigue from prior PT session, however able to perform LB exercises sitting EOB. Pt lateral scoots toward HOB with Min A. Pt presenting with impairments listed below, will follow acutely. Continue to recommend AIR at d/c.  ? ?Recommendations for follow up therapy are one component of a multi-disciplinary discharge planning process, led by the attending physician.  Recommendations may be updated based on patient status, additional functional criteria and insurance authorization. ?   ?Follow Up Recommendations ? Acute inpatient rehab (3hours/day)  ?  ?Assistance Recommended at Discharge Frequent or constant Supervision/Assistance  ?Patient can return home with the following ? A lot of help with walking and/or transfers;A lot of help with bathing/dressing/bathroom;Assistance with cooking/housework;Assist for transportation ?  ?Equipment Recommendations ? None recommended by OT  ?  ?Recommendations for Other Services   ? ?  ?Precautions / Restrictions Precautions ?Precautions: Fall ?Required Braces or Orthoses: Other Brace ?Other Brace: post op shoe ?Restrictions ?Weight Bearing Restrictions: Yes ?RLE Weight Bearing: Non weight bearing ?LLE Weight Bearing: Weight bearing as  tolerated ?Other Position/Activity Restrictions: Per notes, unrestricted ROM  ? ? ?  ? ?Mobility Bed Mobility ?Overal bed mobility: Needs Assistance ?Bed Mobility: Supine to Sit, Sit to Supine ?  ?  ?Supine to sit: Min guard ?Sit to supine: Min guard ?  ?General bed mobility comments: forward head/flexed trunk posture in sitting ?  ? ?Transfers ?Overall transfer level: Needs assistance ?Equipment used: Rolling walker (2 wheels) ?Transfers: Sit to/from Stand, Bed to chair/wheelchair/BSC ?Sit to Stand: Max assist ?  ?  ?  ?  ? Lateral/Scoot Transfers: Min assist ?General transfer comment: pt very fatigued, attempted x1 but cannot clear hips from bed, lateral scoots toward HOB ?  ?  ?Balance Overall balance assessment: Needs assistance ?Sitting-balance support: Bilateral upper extremity supported, Single extremity supported, Feet supported ?Sitting balance-Leahy Scale: Fair ?Sitting balance - Comments: leans posteriorly with LB exercise ?Postural control: Posterior lean ?Standing balance support: Bilateral upper extremity supported ?Standing balance-Leahy Scale: Poor ?  ?  ?  ?  ?  ?  ?  ?  ?  ?  ?  ?  ?   ? ?ADL either performed or assessed with clinical judgement  ? ?ADL   ?  ?  ?  ?  ?  ?  ?  ?  ?  ?  ?Lower Body Dressing: Minimal assistance;Bed level ?Lower Body Dressing Details (indicate cue type and reason): to don/doff post op shoe ?Toilet Transfer: BSC/3in1;Requires drop arm;Moderate assistance;Maximal assistance ?Toilet Transfer Details (indicate cue type and reason): for lateral scoot transfer ?  ?  ?  ?  ?Functional mobility during ADLs: Moderate assistance;Maximal assistance ?  ?  ? ?Extremity/Trunk Assessment Upper Extremity Assessment ?Upper Extremity Assessment: Generalized  weakness ?  ?Lower Extremity Assessment ?Lower Extremity Assessment: Defer to PT evaluation ?  ?  ?  ? ?Vision   ?Vision Assessment?: No apparent visual deficits ?  ?Perception Perception ?Perception: Not tested ?  ?Praxis  Praxis ?Praxis: Not tested ?  ? ?Cognition Arousal/Alertness: Awake/alert ?Behavior During Therapy: [facility] for tasks assessed/performed ?Overall Cognitive Status: Within Functional Limits for tasks assessed ?  ?  ?  ?  ?  ?  ?  ?  ?  ?  ?  ?  ?  ?  ?  ?  ?  ?  ?  ?   ?Exercises Exercises: General Lower Extremity ?General Exercises - Lower Extremity ?Long Arc Quad: AROM, Both, 15 reps, Seated ?Hip ABduction/ADduction: AROM, Both, 15 reps, Seated ?Hip Flexion/Marching: AROM, 15 reps, Seated, Both ? ?  ?Shoulder Instructions   ? ? ?  ?General Comments    ? ? ?Pertinent Vitals/ Pain       Pain Assessment ?Pain Assessment: Faces ?Pain Score: 4  ?Faces Pain Scale: Hurts little more ?Pain Location: RLE ?Pain Descriptors / Indicators: Aching, Grimacing, Guarding, Sore ?Pain Intervention(s): Limited activity within patient's tolerance, Monitored during session, Repositioned ? ?Home Living   ?  ?  ?  ?  ?  ?  ?  ?  ?  ?  ?  ?  ?  ?  ?  ?  ?  ?  ? ?  ?Prior Functioning/Environment    ?  ?  ?  ?   ? ?Frequency ? Min 2X/week  ? ? ? ? ?  ?Progress Toward Goals ? ?OT Goals(current goals can now be found in the care plan section) ? Progress towards OT goals: Progressing toward goals ? ?Acute Rehab OT Goals ?Patient Stated Goal: none stated ?OT Goal Formulation: With patient ?Time For Goal Achievement: 01/20/22 ?Potential to Achieve Goals: Good ?ADL Goals ?Pt Will Perform Upper Body Dressing: Independently;sitting ?Pt Will Perform Lower Body Dressing: with min guard assist;sit to/from stand ?Pt Will Transfer to Toilet: squat pivot transfer;bedside commode;with min assist ?Pt Will Perform Tub/Shower Transfer: with mod assist;Squat pivot transfer  ?Plan Frequency remains appropriate;Discharge plan remains appropriate   ? ?Co-evaluation ? ? ?   ?  ?  ?  ?  ? ?  ?AM-PAC OT "6 Clicks" Daily Activity     ?Outcome Measure ? ? Help from another person eating meals?: None ?Help from another person taking care of personal grooming?: A  Little ?Help from another person toileting, which includes using toliet, bedpan, or urinal?: A Lot ?Help from another person bathing (including washing, rinsing, drying)?: A Lot ?Help from another person to put on and taking off regular upper body clothing?: A Lot ?Help from another person to put on and taking off regular lower body clothing?: A Lot ?6 Click Score: 15 ? ?  ?End of Session Equipment Utilized During Treatment: Gait belt;Rolling walker (2 wheels) ? ?OT Visit Diagnosis: Unsteadiness on feet (R26.81);Other abnormalities of gait and mobility (R26.89);Muscle weakness (generalized) (M62.81);Pain ?Pain - Right/Left: Right ?Pain - part of body: Leg ?  ?Activity Tolerance Patient limited by fatigue ?  ?Patient Left in bed;with call bell/phone within reach;with bed alarm set ?  ?Nurse Communication Mobility status ?  ? ?   ? ?Time: 7510-2585 ?OT Time Calculation (min): 22 min ? ?Charges: OT General Charges ?$OT Visit: 1 Visit ?OT Treatments ?$Therapeutic Activity: 8-22 mins ? ?Lynnda Child, OTD, OTR/L ?Acute Rehab ?[phone] ? ? ?Ellie Lunch  Daejah Klebba ?01/12/2022, 12:09 PM ?

## 2022-01-12 NOTE — Progress Notes (Signed)
Physical Therapy Treatment ?Patient Details ?Name: [name] [name] ?MRN: [id number] ?DOB: 1951/03/25 ?Today's Date: 01/12/2022 ? ? ?History of Present Illness Pt is a 71 yr old female who presented 3/16 due to a fall. A-ray showed  tibia plateau, tibia shaft, and lat malleolus fxs.S/p R ORIF on 3/20.  Pt also with  Acute minimally displaced fracture of the fifth metatarsal on the L.  PMH: PAF s/p maze, MVR a/p replacement valve, multiple strokes, HTN, HFpEF, breast ca, COPD, ? ?  ?PT Comments  ? ? Pt received reclined in bed and, despite c/o fatigue, agreeable to session. Pt requiring min guard for bed mobility with increased time needed and use of bed rails, but no physical assist needed. Pt needing max assist to attempt standing at EOB, pt unable to achieve due to poor LLE power and despite elevation of bed. Pt able to fully clear hips but unable to elevate torso. Pt needing max cues to maintain NWB of RLE throughout standing trials. Pt declining further attempts. Pt with good tolerance for LE therex for increased ROM and strength. Pt continues to benefit from skilled PT services to progress toward functional mobility goals.  ?  ?Recommendations for follow up therapy are one component of a multi-disciplinary discharge planning process, led by the attending physician.  Recommendations may be updated based on patient status, additional functional criteria and insurance authorization. ? ?Follow Up Recommendations ? Acute inpatient rehab (3hours/day) ?  ?  ?Assistance Recommended at Discharge Frequent or constant Supervision/Assistance  ?Patient can return home with the following Two people to help with walking and/or transfers;Two people to help with bathing/dressing/bathroom;Help with stairs or ramp for entrance;Assist for transportation;Assistance with cooking/housework ?  ?Equipment Recommendations ? Wheelchair cushion (measurements PT);Wheelchair (measurements PT)  ?  ?Recommendations for Other Services   ? ? ?   ?Precautions / Restrictions Precautions ?Precautions: Fall ?Required Braces or Orthoses: Other Brace ?Other Brace: post op shoe ?Restrictions ?Weight Bearing Restrictions: Yes ?RLE Weight Bearing: Non weight bearing ?LLE Weight Bearing: Weight bearing as tolerated ?Other Position/Activity Restrictions: Per notes, unrestricted ROM  ?  ? ?Mobility ? Bed Mobility ?Overal bed mobility: Needs Assistance ?Bed Mobility: Supine to Sit, Sit to Supine ?  ?  ?Supine to sit: Min guard ?Sit to supine: Min guard ?  ?General bed mobility comments: pt needs trunk control once sitting at EOB as easliy fatigues ?  ? ?Transfers ?Overall transfer level: Needs assistance ?Equipment used: Rolling walker (2 wheels) ?Transfers: Sit to/from Stand ?Sit to Stand: Max assist ?  ?  ?  ?  ?  ?General transfer comment: max a to attempt to come to full standing x2 trials, pt unable to achieve despite very elevated EOB., pt stating she was feeling fatigued, decling further attempts. difficulty with NWB compliance without max cues. ?  ? ?Ambulation/Gait ?  ?  ?  ?  ?  ?  ?  ?  ? ? ?Stairs ?  ?  ?  ?  ?  ? ? ?Wheelchair Mobility ?  ? ?Modified Rankin (Stroke Patients Only) ?  ? ? ?  ?Balance Overall balance assessment: Needs assistance ?Sitting-balance support: Bilateral upper extremity supported, Single extremity supported, Feet supported ?Sitting balance-Leahy Scale: Fair ?  ?Postural control: Posterior lean ?Standing balance support: Bilateral upper extremity supported ?Standing balance-Leahy Scale: Poor ?Standing balance comment: heavy reliance on BUE ?  ?  ?  ?  ?  ?  ?  ?  ?  ?  ?  ?  ? ?  ?  Cognition Arousal/Alertness: Awake/alert ?Behavior During Therapy: [facility] for tasks assessed/performed ?Overall Cognitive Status: Within Functional Limits for tasks assessed ?  ?  ?  ?  ?  ?  ?  ?  ?  ?  ?  ?  ?  ?  ?  ?  ?  ?  ?  ? ?  ?Exercises General Exercises - Lower Extremity ?Ankle Circles/Pumps: AROM, Both, 10 reps, Supine ?Quad Sets: AROM, Left,  Supine, 10 reps ?Long Arc Quad: AROM, Left, 10 reps, Seated ?Heel Slides: AROM, Left, 5 reps, Supine ?Hip ABduction/ADduction: AROM, Both, 10 reps, Seated ?Straight Leg Raises: AROM, AAROM, Left, 5 reps, Supine ?Hip Flexion/Marching: AROM, Both, 20 reps, Seated ?Heel Raises: AROM, Left, 10 reps, Seated ?Other Exercises ?Other Exercises: pillow squeeze x10 ? ?  ?General Comments   ?  ?  ? ?Pertinent Vitals/Pain Pain Assessment ?Pain Assessment: Faces ?Faces Pain Scale: Hurts little more ?Pain Location: RLE ?Pain Descriptors / Indicators: Aching, Grimacing, Guarding ?Pain Intervention(s): Limited activity within patient's tolerance, Monitored during session, Repositioned  ? ? ?Home Living   ?  ?  ?  ?  ?  ?  ?  ?  ?  ?   ?  ?Prior Function    ?  ?  ?   ? ?PT Goals (current goals can now be found in the care plan section) Acute Rehab PT Goals ?PT Goal Formulation: With patient ?Time For Goal Achievement: 01/20/22 ? ?  ?Frequency ? ? ? Min 5X/week ? ? ? ?  ?PT Plan    ? ? ?Co-evaluation   ?  ?  ?  ?  ? ?  ?AM-PAC PT "6 Clicks" Mobility   ?Outcome Measure ? Help needed turning from your back to your side while in a flat bed without using bedrails?: A Little ?Help needed moving from lying on your back to sitting on the side of a flat bed without using bedrails?: A Little ?Help needed moving to and from a bed to a chair (including a wheelchair)?: Total ?Help needed standing up from a chair using your arms (e.g., wheelchair or bedside chair)?: Total ?Help needed to walk in hospital room?: Total ?Help needed climbing 3-5 steps with a railing? : Total ?6 Click Score: 10 ? ?  ?End of Session Equipment Utilized During Treatment: Gait belt ?Activity Tolerance: Patient limited by fatigue ?Patient left: in bed;with call bell/phone within reach;with bed alarm set ?Nurse Communication: Mobility status ?PT Visit Diagnosis: Other abnormalities of gait and mobility (R26.89);Muscle weakness (generalized) (M62.81);Difficulty in walking,  not elsewhere classified (R26.2) ?  ? ? ?Time: 5625-6389 ?PT Time Calculation (min) (ACUTE ONLY): 22 min ? ?Charges:  $Therapeutic Exercise: 8-22 mins          ?          ? ?[name] [name] ?Acute Rehabilitation Services ?Office: [phone] ? ? ? ?[name] [name] ?01/12/2022, 9:38 AM ? ?

## 2022-01-12 NOTE — Consult Note (Signed)
? ?  [facility] CM Inpatient Consult ? ? ?01/12/2022 ? ?[name] [name] ?1950-11-05 ?[id number] ? ?[facility] [ACO] Patient: Medicare ACO Reach ? ?Update/Follow up:   Transitioning to SNF ? ?Patient is transitioning to a non [facility] Skilled nursing facility. ? ? ?For questions or referrals, please contact: ? ? ?Natividad Brood, RN BSN CCM ?[facility] Liaison ? [phone] business mobile phone ?Toll free office [phone]  ?Fax number: [phone] ?[email or web address] ? ?  ? ?

## 2022-01-12 NOTE — TOC Transition Note (Signed)
Transition of Care (TOC) - CM/SW Discharge Note ? ? ?Patient Details  ?Name: [name] [name] ?MRN: [id number] ?Date of Birth: 15-Mar-1951 ? ?Transition of Care [facility]) CM/SW Contact:  ?Joanne Chars, LCSW ?Phone Number: ?01/12/2022, 12:02 PM ? ? ?Clinical Narrative:   Pt discharging to [facility].  RN call report to [phone] (back up number: [phone]) ? ? ? ?Final next level of care: [facility] ?Barriers to Discharge: Barriers Resolved ? ? ?Patient Goals and CMS Choice ?Patient states their goals for this hospitalization and ongoing recovery are:: get back home ?CMS Medicare.gov Compare Post Acute Care list provided to:: Patient ?Choice offered to / list presented to : Patient ? ?Discharge Placement ?  ?           ?[facility] at[facility] ?Patient to be transferred to facility by: [facility] ?Name of family member notified: friend Rush Landmark, brother Jerline Pain ?Patient and family notified of of transfer: 01/12/22 ? ?Discharge Plan and Services ?In-house Referral: Clinical Social Work ?  ?Post Acute Care Choice: IP Rehab          ?  ?  ?  ?  ?  ?  ?  ?  ?  ?  ? ?Social Determinants of Health (SDOH) Interventions ?  ? ? ?Readmission Risk Interventions ?   ? View : No data to display.  ?  ?  ?  ? ? ? ? ? ?

## 2022-01-12 NOTE — Care Management Important Message (Signed)
Important Message ? ?Patient Details  ?Name: [name] [name] ?MRN: [id number] ?Date of Birth: [birth year] ? ? ?Medicare Important Message Given:  Yes ? ? ? ? ?Levada Dy  Elian Gloster-Martin ?01/12/2022, 2:48 PM ?

## 2022-01-12 NOTE — Plan of Care (Signed)
  Problem: Pain Managment: Goal: General experience of comfort will improve Outcome: Progressing   Problem: Safety: Goal: Ability to remain free from injury will improve Outcome: Progressing   Problem: Skin Integrity: Goal: Risk for impaired skin integrity will decrease Outcome: Progressing   

## 2022-01-12 NOTE — Progress Notes (Signed)
Patient is feeling well, no chest pain or dyspnea, post operative pain is well controlled.  ? ?BP (!) 110/92 (BP Location: Left Arm)   Pulse 87   Temp 98 ?F (36.7 ?C) (Oral)   Resp 17   Ht 5' 6.5" (1.689 m)   Wt 65.8 kg   SpO2 99%   BMI 23.05 kg/m?  ? ?Neurology awake and alert ?ENT with no pallor or icterus ?Cardiovascular with S1 and S2 present and rhythmic with no gallops or rubs ?Respiratory with no wheezing or rales ?Abdomen not distended ?No lower extremity edema ? ?Right tibial plateau fracture ? ?Plan to transfer to SNF today, patient is medically stable for discharge.  ?

## 2022-01-12 NOTE — Progress Notes (Signed)
AVS and social worker's paperwork placed in discharge packet. Report called and given to Daphene Calamity, RN at [facility]. All questions answered to satisfaction. [facility] to transport pt with all belongings.  ?

## 2022-01-14 ENCOUNTER — Telehealth: Payer: Self-pay

## 2022-01-14 DIAGNOSIS — I480 Paroxysmal atrial fibrillation: Secondary | ICD-10-CM | POA: Diagnosis not present

## 2022-01-14 DIAGNOSIS — D508 Other iron deficiency anemias: Secondary | ICD-10-CM | POA: Diagnosis not present

## 2022-01-14 DIAGNOSIS — I10 Essential (primary) hypertension: Secondary | ICD-10-CM | POA: Diagnosis not present

## 2022-01-14 DIAGNOSIS — E440 Moderate protein-calorie malnutrition: Secondary | ICD-10-CM | POA: Diagnosis not present

## 2022-01-14 DIAGNOSIS — E039 Hypothyroidism, unspecified: Secondary | ICD-10-CM | POA: Diagnosis not present

## 2022-01-14 NOTE — Telephone Encounter (Signed)
Pt called for tcm however pt is @ [name] [name] for rehabilitation. She wanted you to know everything is going well and she was happy we checked in on her .  ?

## 2022-01-15 DIAGNOSIS — S82141A Displaced bicondylar fracture of right tibia, initial encounter for closed fracture: Secondary | ICD-10-CM | POA: Diagnosis not present

## 2022-01-15 DIAGNOSIS — I639 Cerebral infarction, unspecified: Secondary | ICD-10-CM | POA: Diagnosis not present

## 2022-01-15 DIAGNOSIS — I509 Heart failure, unspecified: Secondary | ICD-10-CM | POA: Diagnosis not present

## 2022-01-20 ENCOUNTER — Encounter (HOSPITAL_COMMUNITY): Payer: Self-pay | Admitting: Emergency Medicine

## 2022-01-20 ENCOUNTER — Observation Stay (HOSPITAL_COMMUNITY)
Admission: EM | Admit: 2022-01-20 | Discharge: 2022-01-21 | Disposition: A | Discharge status: Skilled Nursing Facility | Payer: Medicare Other | Attending: Family Medicine | Admitting: Family Medicine

## 2022-01-20 ENCOUNTER — Other Ambulatory Visit: Payer: Self-pay

## 2022-01-20 DIAGNOSIS — I639 Cerebral infarction, unspecified: Secondary | ICD-10-CM | POA: Diagnosis present

## 2022-01-20 DIAGNOSIS — Z959 Presence of cardiac and vascular implant and graft, unspecified: Secondary | ICD-10-CM | POA: Insufficient documentation

## 2022-01-20 DIAGNOSIS — G35D Multiple sclerosis, unspecified: Secondary | ICD-10-CM | POA: Diagnosis present

## 2022-01-20 DIAGNOSIS — I480 Paroxysmal atrial fibrillation: Secondary | ICD-10-CM | POA: Diagnosis not present

## 2022-01-20 DIAGNOSIS — K625 Hemorrhage of anus and rectum: Secondary | ICD-10-CM | POA: Diagnosis not present

## 2022-01-20 DIAGNOSIS — J449 Chronic obstructive pulmonary disease, unspecified: Secondary | ICD-10-CM | POA: Insufficient documentation

## 2022-01-20 DIAGNOSIS — K922 Gastrointestinal hemorrhage, unspecified: Secondary | ICD-10-CM

## 2022-01-20 DIAGNOSIS — D649 Anemia, unspecified: Secondary | ICD-10-CM | POA: Diagnosis present

## 2022-01-20 DIAGNOSIS — Z7901 Long term (current) use of anticoagulants: Secondary | ICD-10-CM | POA: Diagnosis not present

## 2022-01-20 DIAGNOSIS — I10 Essential (primary) hypertension: Secondary | ICD-10-CM | POA: Diagnosis present

## 2022-01-20 DIAGNOSIS — R8281 Pyuria: Secondary | ICD-10-CM

## 2022-01-20 DIAGNOSIS — Z8673 Personal history of transient ischemic attack (TIA), and cerebral infarction without residual deficits: Secondary | ICD-10-CM | POA: Insufficient documentation

## 2022-01-20 DIAGNOSIS — K5641 Fecal impaction: Secondary | ICD-10-CM

## 2022-01-20 DIAGNOSIS — E039 Hypothyroidism, unspecified: Secondary | ICD-10-CM | POA: Diagnosis not present

## 2022-01-20 DIAGNOSIS — R319 Hematuria, unspecified: Secondary | ICD-10-CM

## 2022-01-20 DIAGNOSIS — D75839 Thrombocytosis, unspecified: Secondary | ICD-10-CM

## 2022-01-20 DIAGNOSIS — I110 Hypertensive heart disease with heart failure: Secondary | ICD-10-CM | POA: Insufficient documentation

## 2022-01-20 DIAGNOSIS — R58 Hemorrhage, not elsewhere classified: Secondary | ICD-10-CM | POA: Diagnosis not present

## 2022-01-20 DIAGNOSIS — Z79899 Other long term (current) drug therapy: Secondary | ICD-10-CM | POA: Insufficient documentation

## 2022-01-20 DIAGNOSIS — S82141A Displaced bicondylar fracture of right tibia, initial encounter for closed fracture: Secondary | ICD-10-CM | POA: Diagnosis present

## 2022-01-20 DIAGNOSIS — Z853 Personal history of malignant neoplasm of breast: Secondary | ICD-10-CM | POA: Insufficient documentation

## 2022-01-20 DIAGNOSIS — Z952 Presence of prosthetic heart valve: Secondary | ICD-10-CM

## 2022-01-20 DIAGNOSIS — I5032 Chronic diastolic (congestive) heart failure: Secondary | ICD-10-CM | POA: Diagnosis not present

## 2022-01-20 DIAGNOSIS — G35 Multiple sclerosis: Secondary | ICD-10-CM | POA: Diagnosis present

## 2022-01-20 HISTORY — DX: Gastrointestinal hemorrhage, unspecified: K92.2

## 2022-01-20 LAB — CBC WITH DIFFERENTIAL/PLATELET
Abs Immature Granulocytes: 0.1 10*3/uL — ABNORMAL HIGH (ref 0.00–0.07)
Basophils Absolute: 0.1 10*3/uL (ref 0.0–0.1)
Basophils Relative: 1 %
Eosinophils Absolute: 0.1 10*3/uL (ref 0.0–0.5)
Eosinophils Relative: 2 %
HCT: 32.1 % — ABNORMAL LOW (ref 36.0–46.0)
Hemoglobin: 10.8 g/dL — ABNORMAL LOW (ref 12.0–15.0)
Immature Granulocytes: 1 %
Lymphocytes Relative: 11 %
Lymphs Abs: 0.8 10*3/uL (ref 0.7–4.0)
MCH: 32.6 pg (ref 26.0–34.0)
MCHC: 33.6 g/dL (ref 30.0–36.0)
MCV: 97 fL (ref 80.0–100.0)
Monocytes Absolute: 0.8 10*3/uL (ref 0.1–1.0)
Monocytes Relative: 10 %
Neutro Abs: 5.7 10*3/uL (ref 1.7–7.7)
Neutrophils Relative %: 75 %
Platelets: 524 10*3/uL — ABNORMAL HIGH (ref 150–400)
RBC: 3.31 MIL/uL — ABNORMAL LOW (ref 3.87–5.11)
RDW: 13.8 % (ref 11.5–15.5)
WBC: 7.6 10*3/uL (ref 4.0–10.5)
nRBC: 0 % (ref 0.0–0.2)

## 2022-01-20 LAB — URINALYSIS, ROUTINE W REFLEX MICROSCOPIC
Bilirubin Urine: NEGATIVE
Glucose, UA: NEGATIVE mg/dL
Ketones, ur: 5 mg/dL — AB
Nitrite: POSITIVE — AB
Protein, ur: NEGATIVE mg/dL
Specific Gravity, Urine: 1.015 (ref 1.005–1.030)
WBC, UA: >50 WBC/hpf — ABNORMAL HIGH (ref 0–5)
pH: 5 (ref 5.0–8.0)

## 2022-01-20 LAB — COMPREHENSIVE METABOLIC PANEL
ALT: 17 U/L (ref 0–44)
AST: 21 U/L (ref 15–41)
Albumin: 2.8 g/dL — ABNORMAL LOW (ref 3.5–5.0)
Alkaline Phosphatase: 99 U/L (ref 38–126)
Anion gap: 8 (ref 5–15)
BUN: 34 mg/dL — ABNORMAL HIGH (ref 8–23)
CO2: 29 mmol/L (ref 22–32)
Calcium: 9.2 mg/dL (ref 8.9–10.3)
Chloride: 98 mmol/L (ref 98–111)
Creatinine, Ser: 0.7 mg/dL (ref 0.44–1.00)
GFR, Estimated: >60 mL/min (ref >60)
Glucose, Bld: 115 mg/dL — ABNORMAL HIGH (ref 70–99)
Potassium: 3.9 mmol/L (ref 3.5–5.1)
Sodium: 135 mmol/L (ref 135–145)
Total Bilirubin: 0.5 mg/dL (ref 0.3–1.2)
Total Protein: 6.8 g/dL (ref 6.5–8.1)

## 2022-01-20 LAB — PROTIME-INR
INR: 2.4 — ABNORMAL HIGH (ref 0.8–1.2)
Prothrombin Time: 26.2 seconds — ABNORMAL HIGH (ref 11.4–15.2)

## 2022-01-20 LAB — TYPE AND SCREEN
ABO/RH(D): A POS
Antibody Screen: NEGATIVE

## 2022-01-20 LAB — HEMOGLOBIN AND HEMATOCRIT, BLOOD
HCT: 35 % — ABNORMAL LOW (ref 36.0–46.0)
HCT: 32.5 % — ABNORMAL LOW (ref 36.0–46.0)
Hemoglobin: 11.5 g/dL — ABNORMAL LOW (ref 12.0–15.0)
Hemoglobin: 10.9 g/dL — ABNORMAL LOW (ref 12.0–15.0)

## 2022-01-20 LAB — APTT: aPTT: 44 seconds — ABNORMAL HIGH (ref 24–36)

## 2022-01-20 MED ORDER — SODIUM CHLORIDE 0.9 % IV SOLN
1.0000 g | INTRAVENOUS | Status: DC
  Administered 2022-01-20: 1 g via INTRAVENOUS
  Filled 2022-01-20 (×2): qty 10

## 2022-01-20 MED ORDER — POLYETHYLENE GLYCOL 3350 17 G PO PACK
17.0000 g | PACK | Freq: Two times a day (BID) | ORAL | Status: DC
  Administered 2022-01-20 – 2022-01-21 (×2): 17 g via ORAL
  Filled 2022-01-20 (×2): qty 1

## 2022-01-20 MED ORDER — PROGESTERONE MICRONIZED 100 MG PO CAPS
100.0000 mg | ORAL_CAPSULE | Freq: Every morning | ORAL | Status: DC
  Administered 2022-01-21: 100 mg via ORAL
  Filled 2022-01-20: qty 1

## 2022-01-20 MED ORDER — CLOTRIMAZOLE 1 % EX CREA
1.0000 "application " | TOPICAL_CREAM | Freq: Three times a day (TID) | CUTANEOUS | Status: DC
  Administered 2022-01-20 – 2022-01-21 (×2): 1 via TOPICAL
  Filled 2022-01-20: qty 15

## 2022-01-20 MED ORDER — FLUTICASONE PROPIONATE 50 MCG/ACT NA SUSP
1.0000 | Freq: Every morning | NASAL | Status: DC
  Administered 2022-01-21: 1 via NASAL
  Filled 2022-01-20: qty 16

## 2022-01-20 MED ORDER — PANTOPRAZOLE SODIUM 40 MG IV SOLR
40.0000 mg | Freq: Two times a day (BID) | INTRAVENOUS | Status: DC
  Administered 2022-01-20 – 2022-01-21 (×3): 40 mg via INTRAVENOUS
  Filled 2022-01-20 (×3): qty 10

## 2022-01-20 MED ORDER — VITAMIN B-12 1000 MCG PO TABS
1000.0000 ug | ORAL_TABLET | Freq: Every day | ORAL | Status: DC
  Administered 2022-01-21: 1000 ug via ORAL
  Filled 2022-01-20: qty 1

## 2022-01-20 MED ORDER — VITAMIN B-12 1000 MCG SL SUBL: 1000.0000 ug | SUBLINGUAL_TABLET | Freq: Every morning | SUBLINGUAL | Status: DC

## 2022-01-20 MED ORDER — THYROID 60 MG PO TABS
90.0000 mg | ORAL_TABLET | Freq: Every morning | ORAL | Status: DC
  Administered 2022-01-21: 90 mg via ORAL
  Filled 2022-01-20: qty 1

## 2022-01-20 MED ORDER — LISINOPRIL 5 MG PO TABS
5.0000 mg | ORAL_TABLET | Freq: Every morning | ORAL | Status: DC
  Administered 2022-01-21: 5 mg via ORAL
  Filled 2022-01-20: qty 1

## 2022-01-20 MED ORDER — METOPROLOL TARTRATE 25 MG PO TABS
25.0000 mg | ORAL_TABLET | Freq: Two times a day (BID) | ORAL | Status: DC
  Administered 2022-01-20 – 2022-01-21 (×2): 25 mg via ORAL
  Filled 2022-01-20 (×2): qty 1

## 2022-01-20 NOTE — Progress Notes (Signed)
Soap sud enema administered with very good results. ?

## 2022-01-20 NOTE — Patient Outreach (Signed)
[facility]) Care Management ? ?01/20/2022 ? ?[name] [name] ?01-14-1951 ?[id number] ? ? ?Received referral for Care Management from Insurance plan. Assigned patient to Joellyn Quails, RN Care Coordinator for follow up. ? ? ?[name] [name] ?[facility] Management Assistant ?[phone] ? ?

## 2022-01-20 NOTE — H&P (Signed)
?History and Physical  ? ? ?Patient: [name] [name] TGG:[id number] DOB: 1951-08-08 ?DOA: 01/20/2022 ?DOS: the patient was seen and examined on 01/20/2022 ?PCP: Howard Pouch A, DO  ?Patient coming from: SNF ? ?Chief Complaint:  ?Chief Complaint  ?Patient presents with  ? Rectal Bleeding  ? ?HPI: [name] [name] is a 71 y.o. female with medical history significant of PAF, HFpEF, MVR s/p mech valve on coumadin, HTN. Presenting with rectal bleeding. Symptoms started last night. The staff at her SNF was helping her change her undergarments when they noticed some blood in her stool. She reports that the blood wasn't "bright or dark". She didn't think much of it and was able to go to sleep without incident. This morning when she woke, she had additional episodes of blood in her stool. The staff at the SNF became concerned and sent her to the ED for evaluation. She denies abdominal or rectal pain. She denies any NSAID use. She denies any fevers, N/V/D. She denies any history of GI CA. Her last c-scope was in 20120. ? ?Review of Systems: As mentioned in the history of present illness. All other systems reviewed and are negative. ?Past Medical History:  ?Diagnosis Date  ? Anxiety   ? Arthritis   ? knees  ? Breast cancer ([place]) 1999  ? CHF (congestive heart failure) ([place])   ? Related to severe mitral regurgitation, April, 2013  ? COPD (chronic obstructive pulmonary disease) ([place])   ? COPD with emphysema.. Assess by pulmonary team in the hospital April, 2013  ? Ejection fraction   ? EF 60%, echo, April, 2013, with severe MR before mitral valve replacement  ? Herpes   ? Hypothyroidism   ? IBS (irritable bowel syndrome)   ? Mitral valve regurgitation   ? Mitral valve replacement April, 2013, Mitral valve prolapse  ? Multiple sclerosis ([place])   ? Neurogenic bladder   ? Osteoporosis   ? Ovarian cyst   ? Paroxysmal atrial fibrillation ([place])   ? Rapid atrial fibrillation in-hospital, Rapid cardioversion,  before mitral valve surgery  ?  Pulmonary hypertension ([place])   ? Echo, April, 2013, before mitral valve surgery  ? S/P Maze operation for atrial fibrillation 01/26/2012  ? Complete biatrial lesion set using cryothermy via right mini thoracotomy  ? S/P mitral valve replacement 01/26/2012  ? 28m Sorin [employer]omed[employer] Optiform mechanical prosthesis via right mini thoracotomy  ? Warfarin anticoagulation   ? Mechanical mitral prosthesis, April, 20136  ? ?Past Surgical History:  ?Procedure Laterality Date  ? BREAST LUMPECTOMY Right 1999  ? with sent.node, and axillary dissection (20)  ? CHEST TUBE INSERTION  01/26/2012  ? Procedure: CHEST TUBE INSERTION;  Surgeon: CRexene Alberts MD;  [facility] [facility]  Service: Open Heart Surgery;  Laterality: Left;  ? COLONOSCOPY  2010  ? "normal"  ? CYSTOSCOPY  1992  ? LAPAROSCOPIC OVARIAN CYSTECTOMY  1978  ? urethral stricture repair  ? LEFT AND RIGHT HEART CATHETERIZATION WITH CORONARY ANGIOGRAM N/A 01/20/2012  ? Procedure: LEFT AND RIGHT HEART CATHETERIZATION WITH CORONARY ANGIOGRAM;  Surgeon: CBurnell Blanks MD;  [facility];  Service: Cardiovascular;  Laterality: N/A;  ? LYMPHADENECTOMY    ? MAZE  01/26/2012  ? Procedure: MAZE;  Surgeon: CRexene Alberts MD;  [place] [facility]  Service: Open Heart Surgery;  Laterality: N/A;  ? MITRAL VALVE REPLACEMENT  01/26/2012  ? Procedure: MINIMALLY INVASIVE MITRAL VALVE (MV) REPLACEMENT;  Surgeon: CRexene Alberts MD;  [place] [facility]  Service: Open Heart Surgery;  Laterality: Right;  ? ORIF TIBIA PLATEAU Right 01/05/2022  ? Procedure: OPEN REDUCTION INTERNAL FIXATION (ORIF) TIBIAL PLATEAU AND SHAFT;  Surgeon: Altamese Blue, MD;  Location: [place];  Service: Orthopedics;  Laterality: Right;  ? TEE WITHOUT CARDIOVERSION  01/19/2012  ? Procedure: TRANSESOPHAGEAL ECHOCARDIOGRAM (TEE);  Surgeon: Peter M Martinique, MD;  Location: [facility] ENDOSCOPY;  Service: Cardiovascular;  Laterality: N/A;  ? TONSILLECTOMY  1970  ? [place]  ? WRIST SURGERY Right 2012  ? ?Social  History:  reports that she has never smoked. She has never used smokeless tobacco. She reports that she does not drink alcohol and does not use drugs. ? ?Allergies  ?Allergen Reactions  ? Erythromycin Nausea And Vomiting  ? Atorvastatin Other (See Comments)  ?  Pt reports causes body to be stiff, joints ache, aching in knees and ankles, feels more fatigued.   ? Lactose Intolerance (Gi) Other (See Comments)  ?  Upset stomach - yoghurt  ? Valsartan Other (See Comments)  ?  Pt reports caused her depression  ? ? ?Family History  ?Problem Relation Age of Onset  ? Heart disease Father   ?     cardiac arrest   ? CAD Father   ? Hypertension Father   ? CAD Mother   ?     5 stents and numerous bypass surgery  ? Hypertension Mother   ? Hyperlipidemia Mother   ? CAD Other   ? Breast cancer Maternal Grandmother   ? Breast cancer Maternal Aunt   ? ? ?Prior to Admission medications   ?Medication Sig Start Date End Date Taking? Authorizing Provider  ?acetaminophen (TYLENOL) 500 MG tablet Take 1,000 mg by mouth every 6 (six) hours as needed for moderate pain.    [provider]  ?ARMOUR THYROID 90 MG tablet Take 1 tablet (90 mg total) by mouth daily. 01/29/21   Angiulli, Lavon Paganini, PA-C  ?Biotin 5000 MCG CAPS Take 10,000 mcg by mouth in the morning and at bedtime.    [provider]  ?Black Cohosh 40 MG CAPS Take 40 mg by mouth daily.    [provider]  ?Bromelains (BROMELAIN PO) Take 1 capsule by mouth 2 (two) times daily.    [provider]  ?calcium carbonate (OSCAL) 1500 (600 Ca) MG TABS tablet Take 1,500 mg by mouth daily.    [provider]  ?Cholecalciferol (VITAMIN D3) 2000 units capsule Take 2,000-4,000 Units by mouth See admin instructions. Takes 2000 IU one day and 4000 IU next day .    [provider]  ?Coenzyme Q10 (CO Q-10) 100 MG CAPS Take 100 mg by mouth daily.    [provider]  ?Cranberry 500 MG CAPS Take 500 mg by mouth 2 (two) times daily.     [provider]  ?fluticasone (FLONASE) 50 MCG/ACT nasal spray Place 1 spray into both nostrils daily. 04/29/21   Kuneff, Renee A, DO  ?L-THEANINE PO Take 1 capsule by mouth every evening.    [provider]  ?lactose free nutrition (BOOST PLUS) LIQD Take 237 mLs by mouth 2 (two) times daily.    [provider]  ?lidocaine-prilocaine (EMLA) cream Apply 1 application. topically daily as needed (per pt this is for use before blood draws). 03/06/15   [provider]  ?lisinopril (ZESTRIL) 5 MG tablet Take 1 tablet (5 mg total) by mouth daily. 08/26/21   Freada Bergeron, MD  ?Lysine 500 MG CAPS  Take 1,000 mg by mouth 2 (two) times daily.    [provider]  ?Magnesium Citrate 200 MG TABS Take 100 mg by mouth daily.    [provider]  ?meclizine (ANTIVERT) 12.5 MG tablet Take 1 tablet (12.5 mg total) by mouth 2 (two) times daily as needed for dizziness. 01/29/21   Angiulli, Lavon Paganini, PA-C  ?metoprolol tartrate (LOPRESSOR) 25 MG tablet TAKE 1 TAB (25 MG) PO TWICE DAILY - pt must make appt with provider for further refills - 1st attempt ?Patient taking differently: Take 25 mg by mouth 2 (two) times daily. 01/29/21   Angiulli, Lavon Paganini, PA-C  ?MILK THISTLE PO Take 1 capsule by mouth daily.    [provider]  ?Misc Natural Products (GLUCOSAMINE CHOND COMPLEX/MSM PO) Take 1 tablet by mouth in the morning and at bedtime. Strength of dose Glucosamine '1500mg'$  /Chodroitron '1000mg'$ / MSM '500mg'$  takes one twice daily    [provider]  ?Olive Leaf 500 MG CAPS Take 1 capsule by mouth 2 (two) times daily.     [provider]  ?Omega 3 1200 MG CAPS Take 1,200 mg by mouth 2 (two) times daily.    [provider]  ?OVER THE COUNTER MEDICATION Take 1 capsule by mouth 2 (two) times daily. Col[place]aid    [provider]  ?Petasin (PETADOLEX PO) Take 1 tablet by mouth 2 (two) times daily.    [provider]  ?Probiotic Product (PROBIOTIC  DAILY PO) Take 1 capsule by mouth daily.    [provider]  ?PROGESTERONE MICRONIZED PO Take 100 mg by mouth daily.     [provider]  ?Pumpkin Seed 500 MG TABS Take 500 mg by mouth 2 (two)

## 2022-01-20 NOTE — ED Triage Notes (Signed)
Pt BIB EMS from [place] home, c/o red bloody stool. Recently treated for fall, stitches noted on right leg. Takes Coumadin PO, INR unknown. A&O x4, denies pain ? ?BP 138/78 ?P 88 ?spO2 99% RA ?

## 2022-01-20 NOTE — ED Notes (Signed)
Kim at bedside for transport ?

## 2022-01-20 NOTE — ED Notes (Signed)
Attempted IV insertion on pt. Unsuccessful. IV Team consult order in place.  ?

## 2022-01-20 NOTE — Consult Note (Addendum)
? ?                                            Consultation Note ? ? ?Referring Provider: [facility] ?PCP: Howard Pouch A, DO ?Primary Gastroenterologist: Previously Dr. Olevia Perches  ?Reason for consultation: GI bleed  ?Hospital Day: 1 ? ?Assessment  ? ?Painless lower GI bleeding, overall seems low volume. Hgb is stable (10.8) compared to recent admission. DRE not done as she has unformed, yellowish stool covering perianal area. Given this and stable hemoglobin query if this was hemorrhoidal bleeding? Of course diverticular hemorrhage, colon neoplasm are also on DDx list ? ?Springer anemia. Her baseline hgb is mid 12 to 13. It was 10.6 in hospital when she presented after a fall at home with fractures of R tibia and R ankle. ? ? Mitral valve replacement on chronic warfarin. INR 2. ? ?ORIF of R tibia and R lateral malleolus on 01/05/22 ? ?Multiple sclerosis. She ambulates with a walker. Has neurogenic bladder ? ? ?Plan  ?Last colonoscopy was in 2010. Ideally she needs a colonoscopy, especially given need for chronic anticoagulation. INR is 2.4. Given that she has a mechanical valve maybe bridge with heparin when INR drifts down?  ?Dr. Carlean Purl will see her shortly and decide on timing of colonoscopy  ? ? ? [facility] Attending  ? ?Saw patient as well w/ Ms. Chester Holstein - on DRE with Ms. Chester Holstein present there is a fecal impaction so I suspect anorectal bleeding - ? Stercoral ulcer. Will disimpact and reassess. Given her fracture problems not a good time for colonoscopy unless we are certain it is needed and I doubt it is for this acute problem. ? ?Will f/u tomorrow. She may need a flex sig pending clinical course.  ? ?TRH to manage anti-coagulation  ? ?Gatha Mayer, MD, Marval Regal ?[facility] ?01/20/2022 4:52 PM ? ? ?History of Present Illness  ? ? ?Patient Profile:  ?[name] [name] is a 71 y.o. female with a past medical history significant for COPD,  hypothyroidism, multiple sclerosis, PAF, mitral valve  replacement.   See PMH for any additional medical problems. ? ? ?Patient presented to ED today from nursing home for evaluation of blood in stool. In ED she was hemodynamically stable. Normal WBC, Hgb 10.8 9 at baseline), BUN elevated at 34 but elevated on several other occasions this month. On warfarin. INR 2.4.  ? ?Sherylann has been at [facility] facility after she fell and broke her leg requiring surgery. She very occasionally sees a scant amount of red blood with BM. She has multiple sclerosis and has a neurogenic bladder so wears a pullup. Last night nursing staff at facility told her she had a small amount of blood in her diaper. This am she was incontinent of stool ( she says was brown) and was told there was a lot of red blood in her pull up. She hasn't had any abdominal pain. Her appetite is okay. It should noted that her UA also shows RBCs but urine was yellow so probably not gross hematuria.  ? ?Studies This Admission  ? ?Labs:  ?Recent Labs  ?  01/20/22 ?8416 01/20/22 ?1333  ?WBC 7.6  --   ?HGB 10.8* 11.5*  ?HCT 32.1* 35.0*  ?PLT 524*  --   ? ?Recent Labs  ?  01/20/22 ?6063  ?NA 135  ?K 3.9  ?CL  98  ?CO2 29  ?GLUCOSE 115*  ?BUN 34*  ?CREATININE 0.70  ?CALCIUM 9.2  ? ?Recent Labs  ?  01/20/22 ?7939  ?PROT 6.8  ?ALBUMIN 2.8*  ?AST 21  ?ALT 17  ?ALKPHOS 99  ?BILITOT 0.5  ? ?No results for input(s): HEPBSAG, HCVAB, HEPAIGM, HEPBIGM in the last 72 hours. ?Recent Labs  ?  01/20/22 ?0300  ?LABPROT 26.2*  ?INR 2.4*  ? ? ?Imaging:   ?No results found. ? ? ?Previous GI Evaluations  ? ?May 2010 Screening colonoscopy  ?- normal.  ? ? ?Past Medical History:  ?Diagnosis Date  ? Anxiety   ? Arthritis   ? knees  ? Breast cancer ([place]) 1999  ? CHF (congestive heart failure) ([place])   ? Related to severe mitral regurgitation, April, 2013  ? COPD (chronic obstructive pulmonary disease) ([place])   ? COPD with emphysema.. Assess by pulmonary team in the hospital April, 2013  ? Ejection fraction   ? EF 60%, echo, April, 2013, with severe  MR before mitral valve replacement  ? Herpes   ? Hypothyroidism   ? IBS (irritable bowel syndrome)   ? Mitral valve regurgitation   ? Mitral valve replacement April, 2013, Mitral valve prolapse  ? Multiple sclerosis ([place])   ? Neurogenic bladder   ? Osteoporosis   ? Ovarian cyst   ? Paroxysmal atrial fibrillation ([place])   ? Rapid atrial fibrillation in-hospital, Rapid cardioversion,  before mitral valve surgery  ? Pulmonary hypertension ([place])   ? Echo, April, 2013, before mitral valve surgery  ? S/P Maze operation for atrial fibrillation 01/26/2012  ? Complete biatrial lesion set using cryothermy via right mini thoracotomy  ? S/P mitral valve replacement 01/26/2012  ? 31m Sorin [employer]omedics Optiform mechanical prosthesis via right mini thoracotomy  ? Warfarin anticoagulation   ? Mechanical mitral prosthesis, April, 20136  ? ? ?Past Surgical History:  ?Procedure Laterality Date  ? BREAST LUMPECTOMY Right 1999  ? with sent.node, and axillary dissection (20)  ? CHEST TUBE INSERTION  01/26/2012  ? Procedure: CHEST TUBE INSERTION;  Surgeon: CRexene Alberts MD;  [place] [facility]  Service: Open Heart Surgery;  Laterality: Left;  ? COLONOSCOPY  2010  ? "normal"  ? CYSTOSCOPY  1992  ? LAPAROSCOPIC OVARIAN CYSTECTOMY  1978  ? urethral stricture repair  ? LEFT AND RIGHT HEART CATHETERIZATION WITH CORONARY ANGIOGRAM N/A 01/20/2012  ? Procedure: LEFT AND RIGHT HEART CATHETERIZATION WITH CORONARY ANGIOGRAM;  Surgeon: CBurnell Blanks MD;  [facility];  Service: Cardiovascular;  Laterality: N/A;  ? LYMPHADENECTOMY    ? MAZE  01/26/2012  ? Procedure: MAZE;  Surgeon: CRexene Alberts MD;  [place] [facility]  Service: Open Heart Surgery;  Laterality: N/A;  ? MITRAL VALVE REPLACEMENT  01/26/2012  ? Procedure: MINIMALLY INVASIVE MITRAL VALVE (MV) REPLACEMENT;  Surgeon: CRexene Alberts MD;  [place] [facility]  Service: Open Heart Surgery;  Laterality: Right;  ? ORIF TIBIA PLATEAU Right 01/05/2022  ? Procedure: OPEN REDUCTION INTERNAL  FIXATION (ORIF) TIBIAL PLATEAU AND SHAFT;  Surgeon: HAltamese [place] MD;  [place] [facility] [place]  Service: Orthopedics;  Laterality: Right;  ? TEE WITHOUT CARDIOVERSION  01/19/2012  ? Procedure: TRANSESOPHAGEAL ECHOCARDIOGRAM (TEE);  Surgeon: Peter M JMartinique MD;  [facility];  Service: Cardiovascular;  Laterality: N/A;  ? TONSIL[place]OMY  1970  ? [place] ? WRIST SURGERY Right 2012  ? ? ?Family History  ?Problem Relation Age of Onset  ? Heart  disease Father   ?     cardiac arrest   ? CAD Father   ? Hypertension Father   ? CAD Mother   ?     5 stents and numerous bypass surgery  ? Hypertension Mother   ? Hyperlipidemia Mother   ? CAD Other   ? Breast cancer Maternal Grandmother   ? Breast cancer Maternal Aunt   ? ? ?Prior to Admission medications   ?Medication Sig Start Date End Date Taking? Authorizing Provider  ?acetaminophen (TYLENOL) 500 MG tablet Take 1,000 mg by mouth every 6 (six) hours as needed for moderate pain.    [provider]  ?ARMOUR THYROID 90 MG tablet Take 1 tablet (90 mg total) by mouth daily. 01/29/21   Angiulli, Lavon Paganini, PA-C  ?Biotin 5000 MCG CAPS Take 10,000 mcg by mouth in the morning and at bedtime.    [provider]  ?Black Cohosh 40 MG CAPS Take 40 mg by mouth daily.    [provider]  ?Bromelains (BROMELAIN PO) Take 1 capsule by mouth 2 (two) times daily.    [provider]  ?calcium carbonate (OSCAL) 1500 (600 Ca) MG TABS tablet Take 1,500 mg by mouth daily.    [provider]  ?Cholecalciferol (VITAMIN D3) 2000 units capsule Take 2,000-4,000 Units by mouth See admin instructions. Takes 2000 IU one day and 4000 IU next day .    [provider]  ?Coenzyme Q10 (CO Q-10) 100 MG CAPS Take 100 mg by mouth daily.    [provider]  ?Cranberry 500 MG CAPS Take 500 mg by mouth 2 (two) times daily.    [provider]  ?fluticasone (FLONASE) 50 MCG/ACT nasal spray Place 1 spray into both nostrils  daily. 04/29/21   Kuneff, Renee A, DO  ?L-THEANINE PO Take 1 capsule by mouth every evening.    [provider]  ?lactose free nutrition (BOOST PLUS) LIQD Take 237 mLs by mouth 2 (two) times daily.    Prov

## 2022-01-20 NOTE — ED Provider Notes (Signed)
?[facility] DEPT ?Provider Note ? ? ?CSN: [id number] ?Arrival date & time: 01/20/22  0741 ? ?  ? ?History ?Chief Complaint  ?Patient presents with  ? Rectal Bleeding  ? ? ?[name] [name] is a 71 y.o. female with history of CHF, COPD, arthritis, mechanical mitral valve replacement, on warfarin, presents the emergency department for evaluation of rectal bleeding since yesterday.  The patient denies any abdominal pain, nausea, vomiting, or rectal pain.  She is unsure as to what her stool looks like as she has someone else change her.  In recent events, the patient is at a nursing home/rehab facility, [facility], due to her right leg surgery.  Patient had a fall around 12-31-2021 where she had a right tibial and fibula fracture.  Had a surgery on 01-05-2022 by Dr. Marcelino Scot.  She reports that yesterday they noticed a small amount of blood on her brief and checked her INR which was "normal "per patient.  They went to change her this morning and noticed more blood.  She denies any history of this.  She denies any GI follow-up.  She reports her therapeutic INR is between 2.5 - 3.5. ? ?Rectal Bleeding ?Associated symptoms: no abdominal pain, no fever, no light-headedness and no vomiting   ? ?  ? ?Home Medications ?Prior to Admission medications   ?Medication Sig Start Date End Date Taking? Authorizing Provider  ?acetaminophen (TYLENOL) 500 MG tablet Take 1,000 mg by mouth every 6 (six) hours as needed for moderate pain.    [provider]  ?ARMOUR THYROID 90 MG tablet Take 1 tablet (90 mg total) by mouth daily. 01/29/21   Angiulli, Lavon Paganini, PA-C  ?Biotin 5000 MCG CAPS Take 10,000 mcg by mouth in the morning and at bedtime.    [provider]  ?Black Cohosh 40 MG CAPS Take 40 mg by mouth daily.    [provider]  ?Bromelains (BROMELAIN PO) Take 1 capsule by mouth 2 (two) times daily.    [provider]  ?calcium carbonate (OSCAL) 1500 (600 Ca) MG TABS tablet Take  1,500 mg by mouth daily.    [provider]  ?Cholecalciferol (VITAMIN D3) 2000 units capsule Take 2,000-4,000 Units by mouth See admin instructions. Takes 2000 IU one day and 4000 IU next day .    [provider]  ?Coenzyme Q10 (CO Q-10) 100 MG CAPS Take 100 mg by mouth daily.    [provider]  ?Cranberry 500 MG CAPS Take 500 mg by mouth 2 (two) times daily.    [provider]  ?fluticasone (FLONASE) 50 MCG/ACT nasal spray Place 1 spray into both nostrils daily. 04/29/21   Kuneff, Renee A, DO  ?L-THEANINE PO Take 1 capsule by mouth every evening.    [provider]  ?lactose free nutrition (BOOST PLUS) LIQD Take 237 mLs by mouth 2 (two) times daily.    [provider]  ?lidocaine-prilocaine (EMLA) cream Apply 1 application. topically daily as needed (per pt this is for use before blood draws). 03/06/15   [provider]  ?lisinopril (ZESTRIL) 5 MG tablet Take 1 tablet (5 mg total) by mouth daily. 08/26/21   Freada Bergeron, MD  ?Lysine 500 MG CAPS Take 1,000 mg by mouth 2 (two) times daily.    [provider]  ?Magnesium Citrate 200 MG TABS Take 100 mg by mouth daily.    [provider]  ?meclizine (ANTIVERT) 12.5 MG tablet Take 1 tablet (12.5 mg total) by  mouth 2 (two) times daily as needed for dizziness. 01/29/21   Angiulli, Lavon Paganini, PA-C  ?metoprolol tartrate (LOPRESSOR) 25 MG tablet TAKE 1 TAB (25 MG) PO TWICE DAILY - pt must make appt with provider for further refills - 1st attempt ?Patient taking differently: Take 25 mg by mouth 2 (two) times daily. 01/29/21   Angiulli, Lavon Paganini, PA-C  ?MILK THISTLE PO Take 1 capsule by mouth daily.    [provider]  ?Misc Natural Products (GLUCOSAMINE CHOND COMPLEX/MSM PO) Take 1 tablet by mouth in the morning and at bedtime. Strength of dose Glucosamine '1500mg'$  /Chodroitron '1000mg'$ / MSM '500mg'$  takes one twice daily    [provider]  ?Olive Leaf 500 MG CAPS Take 1 capsule  by mouth 2 (two) times daily.     [provider]  ?Omega 3 1200 MG CAPS Take 1,200 mg by mouth 2 (two) times daily.    [provider]  ?OVER THE COUNTER MEDICATION Take 1 capsule by mouth 2 (two) times daily. [place]aid    [provider]  ?Petasin (PETADOLEX PO) Take 1 tablet by mouth 2 (two) times daily.    [provider]  ?Probiotic Product (PROBIOTIC DAILY PO) Take 1 capsule by mouth daily.    [provider]  ?PROGESTERONE MICRONIZED PO Take 100 mg by mouth daily.     [provider]  ?Pumpkin Seed 500 MG TABS Take 500 mg by mouth 2 (two) times daily.    [provider]  ?TURMERIC PO Take 500 mg by mouth every evening.    [provider]  ?vitamin A 10000 UNIT capsule Take 10,000 Units by mouth every evening.    [provider]  ?vitamin B-12 (CYANOCOBALAMIN) 1000 MCG tablet Take 1,000 mcg by mouth daily. Taking sublingal    [provider]  ?vitamin E 400 UNIT capsule Take 400 Units by mouth every evening.    [provider]  ?warfarin (COUMADIN) 5 MG tablet Take 1 tablet by mouth daily except 1.5 tablets on Sundays, Tuesdays, and Thursdays or as directed Anticoagulation Clinic. ?Patient taking differently: Take 5-7.5 mg by mouth See admin instructions. Take 1 tablet by mouth daily except 1.5 tablets on Sundays, Tuesdays, and Thursdays or as directed Anticoagulation Clinic. 12/18/21   Freada Bergeron, MD  ?   ? ?Allergies    ?Erythromycin, Atorvastatin, Lactose intolerance (gi), and Valsartan   ? ?Review of Systems   ?Review of Systems  ?Constitutional:  Negative for chills and fever.  ?Respiratory:  Negative for shortness of breath.   ?Cardiovascular:  Negative for chest pain.  ?Gastrointestinal:  Positive for blood in stool and hematochezia. Negative for abdominal pain, constipation, diarrhea, nausea, rectal pain and vomiting.  ?Genitourinary:  Negative for dysuria and hematuria.  ?Neurological:  Negative  for light-headedness and headaches.  ? ?Physical Exam ?Updated Vital Signs ?BP 130/68 (BP Location: Left Arm)   Pulse 83   Temp 98.1 ?F (36.7 ?C)   Resp 18   Ht 5' 6.5" (1.689 m)   Wt 63.5 kg   SpO2 100%   BMI 22.26 kg/m?  ?Physical Exam ?Vitals and nursing note reviewed. Exam conducted with a chaperone present Andee Poles, RN).  ?Constitutional:   ?   General: She is not in acute distress. ?   Appearance: She is not toxic-appearing.  ?HENT:  ?   Head: Normocephalic and atraumatic.  ?   Mouth/Throat:  ?   Mouth: Mucous membranes are moist.  ?Eyes:  ?  General: No scleral icterus. ?Cardiovascular:  ?   Rate and Rhythm: Normal rate and regular rhythm.  ?Pulmonary:  ?   Effort: Pulmonary effort is normal. No respiratory distress.  ?   Breath sounds: Normal breath sounds.  ?Abdominal:  ?   General: Abdomen is flat. Bowel sounds are normal.  ?   Palpations: Abdomen is soft.  ?   Tenderness: There is no abdominal tenderness. There is no guarding or rebound.  ?Genitourinary: ?   Comments: Clay colored stool with gross red blood present in the patient's brief, buttocks, and rectum.  No visible hemorrhoids.  Foul-smelling urine. ?Musculoskeletal:     ?   General: No deformity.  ?   Cervical back: Normal range of motion.  ?   Comments: Brusing and intact incision noted to the lateral aspect of the right knee/upper leg. No red streaking.    ?Skin: ?   General: Skin is warm and dry.  ?Neurological:  ?   General: No focal deficit present.  ?   Mental Status: She is alert. Mental status is at baseline.  ? ? ?ED Results / Procedures / Treatments   ?Labs ?(all labs ordered are listed, but only abnormal results are displayed) ?Labs Reviewed  ?CBC WITH DIFFERENTIAL/PLATELET - Abnormal; Notable for the following components:  ?    Result Value  ? RBC 3.31 (*)   ? Hemoglobin 10.8 (*)   ? HCT 32.1 (*)   ? Platelets 524 (*)   ? Abs Immature Granulocytes 0.10 (*)   ? All other components within normal limits  ?COMPREHENSIVE METABOLIC  PANEL - Abnormal; Notable for the following components:  ? Glucose, Bld 115 (*)   ? BUN 34 (*)   ? Albumin 2.8 (*)   ? All other components within normal limits  ?APTT - Abnormal; Notable for the following

## 2022-01-21 DIAGNOSIS — K922 Gastrointestinal hemorrhage, unspecified: Secondary | ICD-10-CM | POA: Diagnosis not present

## 2022-01-21 DIAGNOSIS — R319 Hematuria, unspecified: Secondary | ICD-10-CM

## 2022-01-21 DIAGNOSIS — R8281 Pyuria: Secondary | ICD-10-CM

## 2022-01-21 LAB — COMPREHENSIVE METABOLIC PANEL
ALT: 14 U/L (ref 0–44)
AST: 18 U/L (ref 15–41)
Albumin: 2.6 g/dL — ABNORMAL LOW (ref 3.5–5.0)
Alkaline Phosphatase: 90 U/L (ref 38–126)
Anion gap: 9 (ref 5–15)
BUN: 23 mg/dL (ref 8–23)
CO2: 24 mmol/L (ref 22–32)
Calcium: 8.8 mg/dL — ABNORMAL LOW (ref 8.9–10.3)
Chloride: 100 mmol/L (ref 98–111)
Creatinine, Ser: 0.66 mg/dL (ref 0.44–1.00)
GFR, Estimated: >60 mL/min (ref >60)
Glucose, Bld: 110 mg/dL — ABNORMAL HIGH (ref 70–99)
Potassium: 4 mmol/L (ref 3.5–5.1)
Sodium: 133 mmol/L — ABNORMAL LOW (ref 135–145)
Total Bilirubin: 0.6 mg/dL (ref 0.3–1.2)
Total Protein: 6 g/dL — ABNORMAL LOW (ref 6.5–8.1)

## 2022-01-21 LAB — CBC
HCT: 30.9 % — ABNORMAL LOW (ref 36.0–46.0)
Hemoglobin: 10.3 g/dL — ABNORMAL LOW (ref 12.0–15.0)
MCH: 32.1 pg (ref 26.0–34.0)
MCHC: 33.3 g/dL (ref 30.0–36.0)
MCV: 96.3 fL (ref 80.0–100.0)
Platelets: 436 10*3/uL — ABNORMAL HIGH (ref 150–400)
RBC: 3.21 MIL/uL — ABNORMAL LOW (ref 3.87–5.11)
RDW: 13.7 % (ref 11.5–15.5)
WBC: 5.7 10*3/uL (ref 4.0–10.5)
nRBC: 0 % (ref 0.0–0.2)

## 2022-01-21 LAB — PROTIME-INR
INR: 2.6 — ABNORMAL HIGH (ref 0.8–1.2)
Prothrombin Time: 27.9 seconds — ABNORMAL HIGH (ref 11.4–15.2)

## 2022-01-21 MED ORDER — POLYETHYLENE GLYCOL 3350 17 G PO PACK
17.0000 g | PACK | Freq: Every day | ORAL | 0 refills | Status: DC
Start: 2022-01-21 — End: 2022-05-20

## 2022-01-21 MED ORDER — WARFARIN SODIUM 5 MG PO TABS: 5.0000 mg | ORAL_TABLET | Freq: Once | ORAL | Status: DC

## 2022-01-21 MED ORDER — AMOXICILLIN 500 MG PO CAPS: 500.0000 mg | ORAL_CAPSULE | Freq: Three times a day (TID) | ORAL | 0 refills | Status: AC

## 2022-01-21 MED ORDER — ACETAMINOPHEN 325 MG PO TABS
650.0000 mg | ORAL_TABLET | Freq: Four times a day (QID) | ORAL | Status: DC
  Administered 2022-01-21: 650 mg via ORAL
  Filled 2022-01-21: qty 2

## 2022-01-21 MED ORDER — BISACODYL 10 MG RE SUPP
10.0000 mg | RECTAL | 0 refills | Status: DC | PRN
Start: 2022-01-21 — End: 2022-05-20

## 2022-01-21 NOTE — Assessment & Plan Note (Signed)
In setting of fecal impaction, thought likely related to stercoral ulcer ?Hb stable around 10 ?Will discharge with bowel regimen.  GI recommending follow up in May to discuss screening colonoscopy. ? ?

## 2022-01-21 NOTE — Progress Notes (Signed)
? ? ? ?Daily Progress Note ? ?Hospital Day: 2 ? ?Chief Complaint: rectal bleeding fecal impaction  ? ?Brief History ?KHOLE ARTERBURN is a 71 y.o. female with a pmh not limited to COPD, hypothyroidism, multiple sclerosis, PAF, MVP on warfarin.  ? ? ?Assessment   ? ?Painless lower GI bleeding / fecal impaction. Bleeding probably secondary to stercoral ulcer. Hgb overall stable in 10 range.  ?  ?[place] anemia. Baseline hgb is mid 12 to 13. It has been in 10 range since she fell at home sustaining fractures of R tibia and R ankle. ?  ? Mitral valve replacement on chronic warfarin. INR 2.6 ?  ?ORIF of R tibia and R lateral malleolus on 01/05/22 ?  ?Multiple sclerosis. She ambulates with a walker. Has neurogenic bladder ?   ? ?Plan  ? ?Good response to enema yesterday per Nursing staff and patient. Prior to fall and Ortho surgery she didn't have problems with constipation having 1-2 BMs a day. We talked about a bowel regimen to follow until recovered from surgery.  Recommend daily Miralax and dulcolax supp if goes 2 days without adequate BM ?She will follow up with [place] in the office sometime in May after which time she should be back home from rehab. When we see her in clinic will discuss screening colonoscopy ? ? ?Objective  ? ?Imaging:  ? ?No results found. ? ?Lab Results: ?Recent Labs  ?  01/20/22 ?8119 01/20/22 ?1333 01/20/22 ?1856 01/21/22 ?0118  ?WBC 7.6  --   --  5.7  ?HGB 10.8* 11.5* 10.9* 10.3*  ?HCT 32.1* 35.0* 32.5* 30.9*  ?PLT 524*  --   --  436*  ? ?BMET ?Recent Labs  ?  01/20/22 ?1478 01/21/22 ?0118  ?NA 135 133*  ?K 3.9 4.0  ?CL 98 100  ?CO2 29 24  ?GLUCOSE 115* 110*  ?BUN 34* 23  ?CREATININE 0.70 0.66  ?CALCIUM 9.2 8.8*  ? ?LFT ?Recent Labs  ?  01/21/22 ?0118  ?PROT 6.0*  ?ALBUMIN 2.6*  ?AST 18  ?ALT 14  ?ALKPHOS 90  ?BILITOT 0.6  ? ?PT/INR ?Recent Labs  ?  01/20/22 ?2956 01/21/22 ?0756  ?LABPROT 26.2* 27.9*  ?INR 2.4* 2.6*  ? ? ? ?Scheduled inpatient medications:  ? clotrimazole  1 application. Topical TID  ?  fluticasone  1 spray Each Nare q morning  ? lisinopril  5 mg Oral q morning  ? metoprolol tartrate  25 mg Oral BID  ? pantoprazole (PROTONIX) IV  40 mg Intravenous Q12H  ? polyethylene glycol  17 g Oral BID  ? progesterone  100 mg Oral q morning  ? thyroid  90 mg Oral q morning  ? vitamin B-12  1,000 mcg Oral Daily  ? ?Continuous inpatient infusions:  ? cefTRIAXone (ROCEPHIN)  IV Stopped (01/20/22 1801)  ? ?PRN inpatient medications:  ? ?Vital signs in last 24 hours: ?Temp:  [97.9 ?F (36.6 ?C)-98.7 ?F (37.1 ?C)] 98.3 ?F (36.8 ?C) (04/05 0533) ?Pulse Rate:  [81-93] 91 (04/05 0533) ?Resp:  [18] 18 (04/05 0533) ?BP: (135-144)/(64-106) 135/67 (04/05 0533) ?SpO2:  [97 %-100 %] 97 % (04/05 0533) ?Last BM Date : 01/20/22 ? ?Intake/Output Summary (Last 24 hours) at 01/21/2022 0941 ?Last data filed at 01/21/2022 0500 ?Gross per 24 hour  ?Intake 220 ml  ?Output 850 ml  ?Net -630 ml  ? ? ? ?Physical Exam:  ?General: Alert female in NAD ?Heart:  Regular rate and rhythm. No lower extremity edema ?Pulmonary: Normal respiratory effort ?Abdomen:  Soft, nondistended, nontender. Normal bowel sounds.  ?Neurologic: Alert and oriented ?Psych: Pleasant. Cooperative.  ? ? ?Intake/Output from previous day: ?04/04 0701 - 04/05 0700 ?In: 220 [P.O.:120; IV Piggyback:100] ?Out: 850 [Urine:850] ?Intake/Output this shift: ?No intake/output data recorded. ? ?Principal Problem: ?  GIB (gastrointestinal bleeding) ?Active Problems: ?  S/P mitral valve replacement ?  Hypothyroidism ?  Paroxysmal atrial fibrillation (HCC) ?  CVA (cerebral vascular accident) [facility]) ?  Essential hypertension ?  Normocytic anemia ?  Chronic heart failure with preserved ejection fraction (HFpEF) ([place]) ?  Rectal bleeding ?  Fecal impaction ([place]) ? ? ? ? LOS: 0 days  ? ?Tye Savoy ,NP 01/21/2022, 9:41 AM ? ? ? ? ? ? ?

## 2022-01-21 NOTE — Discharge Summary (Signed)
Physician Discharge Summary  ?[name] [name] ZSW:[id number] DOB: 21-Jul-1951 DOA: 01/20/2022 ? ?PCP: [name] Pouch Tinita Brooker, DO ? ?Admit date: 01/20/2022 ?Discharge date: 01/21/2022 ? ?Time spent: 40 minutes ? ?Recommendations for Outpatient Follow-up:  ?Follow outpatient CBC/CMP/INR ?Follow with gastroenterology in May ?Follow INR and warfarin regimen outpatient ?Follow with Dr. Marcelino [name] in 1 week for post op follow up and suture removal ?Discharged on abx for UTI, follow pending urine culture ?Follow hematuria outpatient ? ?Discharge Diagnoses:  ?Principal Problem: ?  GIB (gastrointestinal bleeding) ?Active Problems: ?  Rectal bleeding ?  Fecal impaction ([place]) ?  Normocytic anemia ?  S/P mitral valve replacement ?  Paroxysmal atrial fibrillation (HCC) ?  Pyuria ?  Hematuria ?  Hypothyroidism ?  Essential hypertension ?  Chronic heart failure with preserved ejection fraction (HFpEF) ([place]) ?  Tibial plateau fracture, right ?  Multiple sclerosis ([place]) ?  CVA (cerebral vascular accident) [facility]) ? ? ?Discharge Condition: stable ? ?Diet recommendation: heart healthy ? ?Filed Weights  ? 01/20/22 0816  ?Weight: 63.5 kg  ? ? ?History of present illness:  ?71 yo with hx atrial fibrillation, HFpEF, MVR s/p mechanical valve on coumadin (INR goal 2.5-3.5), HTN, recent right lower extremity fracture s/p repair, who presented with rectal bleeding.  They'd noticed blood in her stool at the SNF and she was sent to the ED for evaluation.  Her Hb has remained stable.  GI evaluated and suspects related to fecal impaction/possible sterocoral ulcer.  Plan at this time is for outpatient GI follow up and bowel regimen. ? ?See below for additional details ? ?Hospital Course:  ?Assessment and Plan: ?* GIB (gastrointestinal bleeding) ?In setting of fecal impaction, thought likely related to stercoral ulcer ?Hb stable around 10 ?Will discharge with bowel regimen.  GI recommending follow up in May to discuss screening colonoscopy. ? ? ?Fecal impaction ([place]) ?As  above, improved after enema ?Discharged with bowel regimen ? ?Normocytic anemia ?Stable ~10, follow outpatient ? ?Paroxysmal atrial fibrillation (HCC) ?Warfarin in setting of mechanical valve, metoprolol ? ?S/P mitral valve replacement ?At goal today 2.5-3.5 ?INR 2.6 today ?Continue warfarin and follow INR outpatient ? ?Hematuria ?Follow after treating UTI ? ?Pyuria ?UA appears c/w UTI, but asymptomatic ?She does have hematuria, will treat with +RBC's, discharge on amoxicillin ?Follow final/pending culture ? ?Essential hypertension ?Lisinopril/metoprolol ? ?Hypothyroidism ?[place] thyroid ? ?Tibial plateau fracture, right ?S/p ORIF right bicondylar tibial plateau, open treatment of tibial spine/eminence fracture, open treatment of tibial shaft fracture, anterior compartment fasciotomy, ORIF R lateral mal, aspiration of R knee hemarthrosis on 3/20 ?Discussed with orthopedics, they'd like to see her in 1 week to follow up/remove sutures ? ? ?Procedures: ?none  ? ?Consultations: ?GI ? ?Discharge Exam: ?Vitals:  ? 01/20/22 2056 01/21/22 0533  ?BP: (!) 144/64 135/67  ?Pulse: 90 91  ?Resp:  18  ?Temp: 98.7 ?F (37.1 ?C) 98.3 ?F (36.8 ?C)  ?SpO2: 98% 97%  ? ?No new complaints ? ?General: No acute distress. ?Cardiovascular: RRR ?Lungs: unlabored ?Abdomen: Soft, nontender, nondistended  ?Neurological: Alert and oriented ?3. Moves all extremities ?4 . Cranial nerves II through XII grossly intact. ?Skin: Warm and dry. No rashes or lesions. ?Extremities: No clubbing or cyanosis. No edema. ? ?Discharge Instructions ? ? ?Discharge Instructions   ? ? Call MD for:  difficulty breathing, headache or visual disturbances   Complete by: As directed ?  ? Call MD for:  extreme fatigue   Complete by: As directed ?  ? Call MD for:  hives  Complete by: As directed ?  ? Call MD for:  persistant dizziness or light-headedness   Complete by: As directed ?  ? Call MD for:  persistant nausea and vomiting   Complete by: As directed ?  ? Call MD  for:  redness, tenderness, or signs of infection (pain, swelling, redness, odor or green/yellow discharge around incision site)   Complete by: As directed ?  ? Call MD for:  severe uncontrolled pain   Complete by: As directed ?  ? Call MD for:  temperature >100.4   Complete by: As directed ?  ? Diet - low sodium heart healthy   Complete by: As directed ?  ? Discharge instructions   Complete by: As directed ?  ? You were seen for gastrointestinal bleeding.  We think this was related to an impaction and possible sterocoral ulcer. ? ?Your blood counts are stable.  Continue your warfarin as prescribed for your mechanical mitral valve.  ? ?We'll send you home with scheduled miralax and dulocolax suppository to use as needed.  Please follow up with gastroenterology in May and they'll discuss [name] [name] screening colonoscopy at that time.  ? ?You have lab findings concerning for [name] [name] UTI.  We'll treat you with amoxicillin for now since you have blood in your urine (despite your lack of symptoms).  Please follow your final culture results with your PCP. ? ?Please follow up with Dr. Marcelino [name] in about 1 week for suture removal and post op visit. ? ?Return for new, recurrent, or worsening symptoms. ? ?Please ask your PCP to request records from this hospitalization so they know what was done and what the next steps will be.  ? Discharge wound care:   Complete by: As directed ?  ? Follow with orthopedics in 1 week  ? Increase activity slowly   Complete by: As directed ?  ? ?  ? ?Allergies as of 01/21/2022   ? ?   Reactions  ? Erythromycin Nausea And Vomiting  ? Atorvastatin Other (See Comments)  ? Pt reports causes body to be stiff, joints ache, aching in knees and ankles, feels more fatigued.   ? Lactose Intolerance (gi) Other (See Comments)  ? Upset stomach - yoghurt  ? Valsartan Other (See Comments)  ? Pt reports caused her depression  ? ?  ? ?  ?Medication List  ?  ? ?TAKE these medications   ? ?acetaminophen 325 MG tablet ?Commonly known  as: TYLENOL ?Take 650 mg by mouth every 6 (six) hours. scheduled ?  ?ACIDOPHILUS LACTOBACILLUS PO ?Take 500 Million Cells by mouth every morning. ?  ?amoxicillin 500 MG capsule ?Commonly known as: AMOXIL ?Take 1 capsule (500 mg total) by mouth 3 (three) times daily for 4 days. ?  ?Biotin 10 MG Caps ?Take 1 capsule by mouth 2 (two) times daily. ?  ?bisacodyl 10 MG suppository ?Commonly known as: DULCOLAX ?Place 1 suppository (10 mg total) rectally as needed for moderate constipation (use if 2 days without adequate bowel movement or as needed). ?  ?Black Cohosh 40 MG Caps ?Take 40 mg by mouth 2 (two) times daily. ?  ?Boost VHC Liqd ?Take 120 mLs by mouth 3 (three) times daily with meals. ?  ?calcium carbonate 1500 (600 Ca) MG Tabs tablet ?Commonly known as: OSCAL ?Take 1,500 mg by mouth every morning. ?  ?Calmoseptine 0.44-20.6 % Oint ?Generic drug: Menthol-Zinc Oxide ?Apply 1 application. topically See admin instructions. Apply topically every shift - apply to buttocks/sacrum with each incontinent change.  May keep at bedside. ?  ?clotrimazole 1 % cream ?Commonly known as: LOTRIMIN ?Apply 1 application. topically 3 (three) times daily. Apply to buttocks ?  ?Co Q-10 100 MG Caps ?Take 100 mg by mouth every morning. ?  ?Cranberry 500 MG Caps ?Take 500 mg by mouth 2 (two) times daily with Jaecion Dempster meal. ?  ?Fish Oil 1200 MG Caps ?Take 1,200 mg by mouth 2 (two) times daily with Lyanna Blystone meal. ?  ?fluticasone 50 MCG/ACT nasal spray ?Commonly known as: FLONASE ?Place 1 spray into both nostrils daily. ?What changed: when to take this ?  ?glucosamine-chondroitin 500-400 MG tablet ?Take 1 tablet by mouth 2 (two) times daily with Trevon Strothers meal. ?  ?lisinopril 5 MG tablet ?Commonly known as: ZESTRIL ?Take 1 tablet (5 mg total) by mouth daily. ?What changed: when to take this ?  ?Lysine 500 MG Caps ?Take 1,000 mg by mouth 2 (two) times daily. ?  ?Magnesium Citrate 100 MG Tabs ?Take 200 mg by mouth every morning. ?  ?metoprolol tartrate 25 MG  tablet ?Commonly known as: LOPRESSOR ?TAKE 1 TAB (25 MG) PO TWICE DAILY - pt must make appt with provider for further refills - 1st attempt ?What changed:  ?how much to take ?how to take this ?when to take this ?

## 2022-01-21 NOTE — Assessment & Plan Note (Signed)
S/p ORIF right bicondylar tibial plateau, open treatment of tibial spine/eminence fracture, open treatment of tibial shaft fracture, anterior compartment fasciotomy, ORIF R lateral mal, aspiration of R knee hemarthrosis on 3/20 ?Discussed with orthopedics, they'd like to see her in 1 week to follow up/remove sutures ?

## 2022-01-21 NOTE — Assessment & Plan Note (Signed)
As above, improved after enema ?Discharged with bowel regimen ?

## 2022-01-21 NOTE — Assessment & Plan Note (Signed)
Follow after treating UTI ?

## 2022-01-21 NOTE — Hospital Course (Signed)
71 yo with hx atrial fibrillation, HFpEF, MVR s/p mechanical valve on coumadin (INR goal 2.5-3.5), HTN, recent right lower extremity fracture s/p repair, who presented with rectal bleeding.  They'd noticed blood in her stool at the SNF and she was sent to the ED for evaluation.  Her Hb has remained stable.  GI evaluated and suspects related to fecal impaction/possible sterocoral ulcer.  Plan at this time is for outpatient GI follow up and bowel regimen. ? ?See below for additional details ?

## 2022-01-21 NOTE — Assessment & Plan Note (Signed)
Warfarin in setting of mechanical valve, metoprolol ?

## 2022-01-21 NOTE — Assessment & Plan Note (Signed)
At goal today 2.5-3.5 ?INR 2.6 today ?Continue warfarin and follow INR outpatient ?

## 2022-01-21 NOTE — Assessment & Plan Note (Signed)
Stable ~10, follow outpatient ?

## 2022-01-21 NOTE — Assessment & Plan Note (Signed)
[place] thyroid ?

## 2022-01-21 NOTE — Progress Notes (Signed)
ANTICOAGULATION CONSULT NOTE ? ?Pharmacy Consult for Warfarin ?Indication: Mechanical mitral valve replacement and PAF ? ? ?Allergies  ?Allergen Reactions  ? Erythromycin Nausea And Vomiting  ? Atorvastatin Other (See Comments)  ?  Pt reports causes body to be stiff, joints ache, aching in knees and ankles, feels more fatigued.   ? Lactose Intolerance (Gi) Other (See Comments)  ?  Upset stomach - yoghurt  ? Valsartan Other (See Comments)  ?  Pt reports caused her depression  ? ? ?Patient Measurements: ?Height: 5' 6.5" (168.9 cm) ?Weight: 63.5 kg (140 lb) ?IBW/kg (Calculated) : 60.45 ?Heparin Dosing Weight:  ? ?Vital Signs: ?Temp: 98.3 ?F (36.8 ?C) (04/05 0533) ?Temp Source: Oral (04/05 0533) ?BP: 135/67 (04/05 0533) ?Pulse Rate: 91 (04/05 0533) ? ?Labs: ?Recent Labs  ?  01/20/22 ?3785 01/20/22 ?1333 01/20/22 ?1856 01/21/22 ?0118 01/21/22 ?0756  ?HGB 10.8* 11.5* 10.9* 10.3*  --   ?HCT 32.1* 35.0* 32.5* 30.9*  --   ?PLT 524*  --   --  436*  --   ?APTT 44*  --   --   --   --   ?LABPROT 26.2*  --   --   --  27.9*  ?INR 2.4*  --   --   --  2.6*  ?CREATININE 0.70  --   --  0.66  --   ? ? ?Estimated Creatinine Clearance: 60.7 mL/min (by C-G formula based on SCr of 0.66 mg/dL). ? ? ?Medications:  ?PTA warfarin regimen: Takes '5mg'$  MWF and Sat. And takes 7.'5mg'$  on Tues, Thurs, and Sunday. (Last dose taken on 01/19/2022) ? ?Assessment: ?[name] [name] is a 71 yo female from SNF with hx of PAF, HFpEF, mitral valve replacement on chronic warfarin and multiple sclerosis with neurogenic bladder who presented to ED on 4/4 with blood in stool.  She underwent ORIF of R tibia and R lateral malleolus on 01/05/22 after fall/fracture on 3/15. Warfarin held on admission due to rectal bleeding. GI team is currently following and suspect bleeding due to fecal impaction. They recommend supportive care at this time. Pharmacy has been consulted on 01/21/2022 to resume warfarin for patient. ? ?Today, 01/21/2022: ?-INR is therapeutic at 2.6 ?-Hgb down  slightly to 10.3 ?-Platelets 436k ?-Drug drug interactions: Being on abx can make pt more sensitive to warfarin - patient currently on ceftriaxone ? ?Goal of Therapy:  ?INR 2.5-3.5 (per outpatient [facility] clinic note) ?Monitor platelets by anticoagulation protocol: Yes ?  ?Plan:  ?-Warfarin '5mg'$  PO x1 today ?-Daily INR  ?-Monitor for signs/symptoms of bleeding  ? ?[name] [name] ?01/21/2022,11:10 AM ? ? ?

## 2022-01-21 NOTE — Assessment & Plan Note (Signed)
Lisinopril/metoprolol ?

## 2022-01-21 NOTE — Assessment & Plan Note (Signed)
UA appears c/w UTI, but asymptomatic ?She does have hematuria, will treat with +RBC's, discharge on amoxicillin ?Follow final/pending culture ?

## 2022-01-21 NOTE — TOC Initial Note (Signed)
Transition of Care (TOC) - Initial/Assessment Note  ? ? ?Patient Details  ?Name: [name] [name] ?MRN: [id number] ?Date of Birth: [birth year] ? ?Transition of Care (TOC) CM/SW Contact:    ?Coralie Stanke, Marjie Skiff, RN ?Phone Number: ?01/21/2022, 1:51 PM ? ?Clinical Narrative:                 ?Pt is from [facility] SNF and will return there today. Per [facility] at [facility] only a DC summary is needed since pt was only here 24hrs. Pt to dc via PTAR to room 401. RN to call report to [phone]. ? ?Expected Discharge Plan: [place] ?Barriers to Discharge: No Barriers Identified ? ? ?Patient Goals and CMS Choice ?Patient states their goals for this hospitalization and ongoing recovery are:: Go back to rehab ?  ?  ? ?Expected Discharge Plan and Services ?Expected Discharge Plan: [place] ?  ?Discharge Planning Services: CM Consult ?  ?Living arrangements for the past 2 months: [place] ?Expected Discharge Date: 01/21/22               ?  ?  ?  ?Prior Living Arrangements/Services ?Living arrangements for the past 2 months: [place] ?Lives with:: Facility Resident ?Patient language and need for interpreter reviewed:: Yes ?Do you feel safe going back to the place where you live?: Yes      ?Need for Family Participation in Patient Care: Yes (Comment) ?Care giver support system in place?: Yes (comment) ?  ?Criminal Activity/Legal Involvement Pertinent to Current Situation/Hospitalization: No - Comment as needed ? ?Activities of Daily Living ?Home Assistive Devices/Equipment: Chana Bode (specify type) (has several walkers, rollator) ?ADL Screening (condition at time of admission) ?Patient's cognitive ability adequate to safely complete daily activities?: Yes ?Is the patient deaf or have difficulty hearing?: No ?Does the patient have difficulty seeing, even when wearing glasses/contacts?: No ?Does the patient have difficulty concentrating, remembering, or making  decisions?: No ?Patient able to express need for assistance with ADLs?: Yes ?Does the patient have difficulty dressing or bathing?: No (patient able to manuever around with walker) ?Independently performs ADLs?: Yes (appropriate for developmental age) (Patient reports that she has a rollator upstairs, walker on first floor, walker near her door to get to her car) ?Does the patient have difficulty walking or climbing stairs?: Yes ?Weakness of Legs: Right ?Weakness of Arms/Hands: None ? ?Permission Sought/Granted ?  ?Permission granted to share information with : Yes, Verbal Permission Granted ?   ? Permission granted to share info w AGENCY: SNF ?   ?   ? ?Emotional Assessment ?Appearance:: Appears stated age ?Attitude/Demeanor/Rapport: Gracious ?Affect (typically observed): Calm ?Orientation: : Oriented to Self, Oriented to Place, Oriented to  Time, Oriented to Situation ?Alcohol / Substance Use: Not Applicable ?Psych Involvement: No (comment) ? ?Admission diagnosis:  Rectal bleeding [K62.5] ?GIB (gastrointestinal bleeding) [K92.2] ?Patient Active Problem List  ? Diagnosis Date Noted  ? Pyuria 01/21/2022  ? Hematuria 01/21/2022  ? GIB (gastrointestinal bleeding) 01/20/2022  ? Chronic heart failure with preserved ejection fraction (HFpEF) ([place]) 01/20/2022  ? Rectal bleeding   ? Fecal impaction ([place])   ? Malnutrition of moderate degree 01/03/2022  ? Normocytic anemia 01/02/2022  ? Tibial plateau fracture, right 01/01/2022  ? Vertigo   ? Elevated BUN   ? Prolonged Q-T interval on ECG   ? Chronic obstructive pulmonary disease (HCC)   ? Premature ventricular contractions   ? Slow transit constipation   ? Torticollis   ?  Essential hypertension   ? Acute lower UTI   ? Brainstem infarct, acute ([place]) 01/13/2021  ? Left pontine cerebrovascular accident [facility]) 01/13/2021  ? CVA (cerebral vascular accident) ([place]) 01/11/2021  ? Right sided weakness   ? PVCs (premature ventricular contractions) 03/30/2020  ? Osteoporosis 07/05/2019   ? Nonischemic cardiomyopathy ([place]) 07/20/2018  ? Scoliosis 11/20/2016  ? Compression fracture of lumbar spine, non-traumatic, sequela 08/11/2016  ? At high risk for injury related to fall 08/11/2016  ? Encounter for therapeutic drug monitoring 11/27/2013  ? Hypothyroidism   ? CHF (congestive heart failure) ([place])   ? Paroxysmal atrial fibrillation (HCC)   ? Arthritis   ? Neurogenic bladder   ? Warfarin anticoagulation   ? First degree heart block 01/29/2012  ? S/P mitral valve replacement 01/26/2012  ? S/P Maze operation for atrial fibrillation 01/26/2012  ? Anxiety 01/18/2012  ? Multiple sclerosis ([place]) 01/19/2007  ? ?PCP:  Howard Pouch A, DO ?Pharmacy:  No Pharmacies Listed ? ? ? ?Social Determinants of Health (SDOH) Interventions ?  ? ?Readmission Risk Interventions ?   ? View : No data to display.  ?  ?  ?  ? ? ? ?

## 2022-01-22 LAB — URINE CULTURE

## 2022-01-27 DIAGNOSIS — K625 Hemorrhage of anus and rectum: Secondary | ICD-10-CM | POA: Diagnosis not present

## 2022-01-27 DIAGNOSIS — I10 Essential (primary) hypertension: Secondary | ICD-10-CM | POA: Diagnosis not present

## 2022-01-27 DIAGNOSIS — I639 Cerebral infarction, unspecified: Secondary | ICD-10-CM | POA: Diagnosis not present

## 2022-01-27 DIAGNOSIS — I509 Heart failure, unspecified: Secondary | ICD-10-CM | POA: Diagnosis not present

## 2022-01-27 DIAGNOSIS — S82141A Displaced bicondylar fracture of right tibia, initial encounter for closed fracture: Secondary | ICD-10-CM | POA: Diagnosis not present

## 2022-01-28 DIAGNOSIS — K625 Hemorrhage of anus and rectum: Secondary | ICD-10-CM | POA: Diagnosis not present

## 2022-01-28 DIAGNOSIS — I509 Heart failure, unspecified: Secondary | ICD-10-CM | POA: Diagnosis not present

## 2022-01-28 DIAGNOSIS — E039 Hypothyroidism, unspecified: Secondary | ICD-10-CM | POA: Diagnosis not present

## 2022-01-28 DIAGNOSIS — S82111D Displaced fracture of right tibial spine, subsequent encounter for closed fracture with routine healing: Secondary | ICD-10-CM | POA: Diagnosis not present

## 2022-01-28 DIAGNOSIS — S92352D Displaced fracture of fifth metatarsal bone, left foot, subsequent encounter for fracture with routine healing: Secondary | ICD-10-CM | POA: Diagnosis not present

## 2022-01-28 DIAGNOSIS — S82141D Displaced bicondylar fracture of right tibia, subsequent encounter for closed fracture with routine healing: Secondary | ICD-10-CM | POA: Diagnosis not present

## 2022-01-28 DIAGNOSIS — I480 Paroxysmal atrial fibrillation: Secondary | ICD-10-CM | POA: Diagnosis not present

## 2022-01-28 DIAGNOSIS — S8264XD Nondisplaced fracture of lateral malleolus of right fibula, subsequent encounter for closed fracture with routine healing: Secondary | ICD-10-CM | POA: Diagnosis not present

## 2022-01-28 DIAGNOSIS — I639 Cerebral infarction, unspecified: Secondary | ICD-10-CM | POA: Diagnosis not present

## 2022-01-30 ENCOUNTER — Other Ambulatory Visit: Payer: Self-pay | Admitting: *Deleted

## 2022-01-30 NOTE — Patient Outreach (Signed)
[place] [facility]) Care Management ?Telephonic RN Care Manager Note ? ? ?01/30/2022 ?Name:  [name] [name] MRN:  [id number] DOB:  1950/11/10 ? ?Summary: ?Unsuccessful initial outreach #1 to ACO/Insurance referred patient ?Unsuccessful outreach letter mailed 01/30/22 ? ?Recommendations/Changes made from today's visit: ?No answer at [address] CM left HIPAA ([place]) compliant voicemail message along with CM's contact info at the preferred number indicated in EPIC  ? ? ? ?Subjective: ?[name] [name] is an 71 y.o. year old female who is a primary patient of Kuneff, Renee A, DO. The care management team was consulted for assistance with care management and/or care coordination needs.   ?Mrs [name] [name] was referred to [facility] on 01/20/22 as an insurance/ACO referral for telephonic outreach screening/assessment of needs. Her primary care office is listed as Financial controller at [facility], Dr Raoul Pitch ? ?Telephonic RN Care Manager completed Telephone Visit today.  ? ?Objective: ? ?Medications Reviewed Today   ? ? Reviewed by Lynelle Doctor, RPH (Pharmacist) on 01/21/22 at [place] List Status: Complete  ? ?Medication Order Taking? Sig Documenting Provider Last Dose Status Informant  ?acetaminophen (TYLENOL) 325 MG tablet [id number] Yes Take 650 mg by mouth every 6 (six) hours. scheduled [provider] 01/20/2022 am Active Nursing Home Medication Administration Guide (MAG)  ?ACIDOPHILUS LACTOBACILLUS PO [id number] Yes Take 500 Million Cells by mouth every morning. [provider] 01/19/2022 Active Nursing Home Medication Administration Guide (MAG)  ?ARMOUR THYROID 90 MG tablet [id number] No Take 1 tablet (90 mg total) by mouth daily.  ?Patient not taking: Reported on 01/20/2022  ? Cathlyn Parsons, PA-C Not Taking Active Other  ?         ?Med Note (ATKINS, HEATHER L   Tue Jan 20, 2022  1:40 PM) NP Thyroid per [facility]  ?Biotin 10 MG CAPS [id number] Yes Take 1 capsule by  mouth 2 (two) times daily. [provider] 01/17/2022 pm Active Nursing Home Medication Administration Guide (MAG)  ?Black Cohosh 40 MG CAPS [id number] Yes Take 40 mg by mouth 2 (two) times daily. [provider] unknown Active Nursing Home Medication Administration Guide (MAG)  ?calcium carbonate (OSCAL) 1500 (600 Ca) MG TABS tablet [id number] Yes Take 1,500 mg by mouth every morning. [provider] 01/19/2022 Active Nursing Home Medication Administration Guide (MAG)  ?Cholecalciferol (VITAMIN D3) 50 MCG (2000 UT) TABS [id number] Yes Take 2,000-4,000 Units by mouth See admin instructions. Take one tablet (2000 units) by mouth every other day and take 2 tablets (4000 units) every other day [provider] 01/18/2022 Active Nursing Home Medication Administration Guide (MAG)  ?         ?Med Note ([place] Jan 20, 2022  1:23 PM) Per [facility] - 1 tablet given 01/18/22 and none given 01/17/22 and 01/19/22  ?clotrimazole (LOTRIMIN) 1 % cream [id number] Yes Apply 1 application. topically 3 (three) times daily. Apply to buttocks [provider] 01/19/2022 pm Active Nursing Home Medication Administration Guide (MAG)  ?Coenzyme Q10 (CO Q-10) 100 MG CAPS [id number] Yes Take 100 mg by mouth every morning. [provider] 01/18/2022 Active Nursing Home Medication Administration Guide (MAG)  ?Cranberry 500 MG CAPS [id number] Yes Take 500 mg by mouth 2 (two) times daily with a meal. [provider] 01/19/2022 pm Active Nursing Home Medication Administration Guide (MAG)  ?Cyanocobalamin (VITAMIN B-12) 1000 MCG SUBL [id number] Yes Place 1,000 mcg under the tongue every morning. [provider]  01/19/2022 Active Nursing Home Medication Administration Guide (MAG)  ?fluticasone (FLONASE) 50 MCG/ACT nasal spray [id number] Yes Place 1 spray into both nostrils daily.  ?Patient taking differently: Place 1 spray into both nostrils every morning.  ? Howard Pouch A, DO 01/19/2022 Active  Nursing Home Medication Administration Guide (MAG)  ?glucosamine-chondroitin 500-400 MG tablet [id number] Yes Take 1 tablet by mouth 2 (two) times daily with a meal. [provider] 01/19/2022 pm Active Nursing Home Medication Administration Guide (MAG)  ?lisinopril (ZESTRIL) 5 MG tablet [id number] Yes Take 1 tablet (5 mg total) by mouth daily.  ?Patient taking differently: Take 5 mg by mouth every morning.  ? [name] Bergeron, MD 01/19/2022 Active Nursing Home Medication Administration Guide (MAG)  ?Lysine 500 MG CAPS [id number] Yes Take 1,000 mg by mouth 2 (two) times daily. [provider] unknown Active Nursing Home Medication Administration Guide (MAG)  ?         ?Med Note (ATKINS, [place] Jan 20, 2022  1:30 PM) Active order per [facility] but not given in April  ?Magnesium Citrate 100 MG TABS [id number] Yes Take 200 mg by mouth every morning. [provider] 01/17/2022 Active Nursing Home Medication Administration Guide (MAG)  ?Menthol-Zinc Oxide (CALMOSEPTINE) 0.44-20.6 % OINT [id number] Yes Apply 1 application. topically See admin instructions. Apply topically every shift - apply to buttocks/sacrum with each incontinent change. May keep at bedside. [provider] 01/19/2022 pm Active Nursing Home Medication Administration Guide (MAG)  ?metoprolol tartrate (LOPRESSOR) 25 MG tablet [id number] Yes TAKE 1 TAB (25 MG) PO TWICE DAILY - pt must make appt with provider for further refills - 1st attempt  ?Patient taking differently: Take 25 mg by mouth 2 (two) times daily.  ? Cathlyn Parsons, PA-C 01/19/2022 1700 Active Nursing Home Medication Administration Guide (MAG)  ?Nutritional Supplements (BOOST VHC) LIQD [id number] Yes Take 120 mLs by mouth 3 (three) times daily with meals. [provider] 01/19/2022 pm Active Nursing Home Medication Administration Guide (MAG)  ?Omega-3 Fatty Acids (FISH OIL) 1200 MG CAPS [id number] Yes Take 1,200 mg by mouth 2 (two) times daily with a meal.  [provider] 01/19/2022 pm Active Nursing Home Medication Administration Guide (MAG)  ?progesterone (PROMETRIUM) 100 MG capsule [id number] Yes Take 100 mg by mouth every morning. [provider] 01/19/2022 Active Nursing Home Medication Administration Guide (MAG)  ?thyroid (ARMOUR) 90 MG tablet [id number] Yes Take 90 mg by mouth every morning. [provider] 01/19/2022 Active Nursing Home Medication Administration Guide (MAG)  ?Turmeric 500 MG CAPS [id number] Yes Take 500 mg by mouth every evening. [provider] unknown Active Nursing Home Medication Administration Guide (MAG)  ?         ?Med Note ([place] Jan 20, 2022  1:36 PM) Active order per [facility] but not given in April ?  ?vitamin A 10000 UNIT capsule [id number] Yes Take 10,000 Units by mouth every evening. [provider] 01/17/2022 Active Nursing Home Medication Administration Guide (MAG)  ?vitamin E 400 UNIT capsule [id number] Yes Take 400 Units by mouth every evening. [provider] 01/18/2022 Active Nursing Home Medication Administration Guide (MAG)  ?warfarin (COUMADIN) 5 MG tablet [id number] Yes Take 1 tablet by mouth daily except 1.5 tablets on Sundays, Tuesdays, and Thursdays or as directed Anticoagulation Clinic.  ?Patient taking differently: Take 5-7.5 mg by mouth See admin instructions. Takes '5mg'$   (1 tablet) daily except 7.5 gm (1.5 tablets) on Tuesday, Thursday and Sunday  ? Johney Frame,  Greer Ee, MD 01/19/2022 1700 Active Nursing Home Medication Administration Guide (MAG)  ?Med List Note Payton Doughty, CPhT 01/20/22 1307): Resident of [facility] [phone]  ? ?  ?  ? ?  ? ?Patient Active Problem List  ? Diagnosis Date Noted  ? Pyuria 01/21/2022  ? Hematuria 01/21/2022  ? GIB (gastrointestinal bleeding) 01/20/2022  ? Chronic heart failure with preserved ejection fraction (HFpEF) ([place]) 01/20/2022  ? Rectal bleeding   ? Fecal impaction ([place])   ? Malnutrition of moderate degree  01/03/2022  ? Normocytic anemia 01/02/2022  ? Tibial plateau fracture, right 01/01/2022  ? Vertigo   ? Elevated BUN   ? Prolonged Q-T interval on ECG   ? Chronic obstructive pulmonary disease (HCC)   ? Premature ve

## 2022-02-04 ENCOUNTER — Other Ambulatory Visit: Payer: Self-pay | Admitting: *Deleted

## 2022-02-04 ENCOUNTER — Encounter: Payer: Self-pay | Admitting: *Deleted

## 2022-02-04 NOTE — Patient Outreach (Signed)
[place] [facility]) Care Management ?Telephonic RN Care Manager Note ? ? ?02/04/2022 ?Name:  [name] [name] MRN:  [id number] DOB:  [birth year] ? ?Summary: ?Outreach to patient She is presently not eligible for [facility] services as she is being followed by a care management services at [facility] facility after a fall 01/20/22 - right tibial plateau fracture  ?She was at [facility] long hospital until 01/21/22  ?She reports she is in rehab and anticipates discharge home in 4-6 weeks but will be willing to participate in [facility] services ? ?Recommendations/Changes made from today's visit: ?Outreach to patient ?Discussed skilled nursing facility (snf) care management services and [facility] services  ?Agreed to check back with pt or have pt monitored for discharge from skilled nursing to transfer to [facility] services if/when possible  ? ? ? ?Subjective: ?[name] [name] is an 71 y.o. year old female who is a primary patient of Kuneff, Renee A, DO. The care management team was consulted for assistance with care management and/or care coordination needs.   ? ?Telephonic RN Care Manager completed Telephone Visit today.  ? ?Objective: ? ?Medications Reviewed Today   ? ? Reviewed by Lynelle Doctor, RPH (Pharmacist) on 01/21/22 at [place] List Status: Complete  ? ?Medication Order Taking? Sig Documenting Provider Last Dose Status Informant  ?acetaminophen (TYLENOL) 325 MG tablet [id number] Yes Take 650 mg by mouth every 6 (six) hours. scheduled [provider] 01/20/2022 am Active Nursing Home Medication Administration Guide (MAG)  ?ACIDOPHILUS LACTOBACILLUS PO [id number] Yes Take 500 Million Cells by mouth every morning. [provider] 01/19/2022 Active Nursing Home Medication Administration Guide (MAG)  ?ARMOUR THYROID 90 MG tablet [id number] No Take 1 tablet (90 mg total) by mouth daily.  ?Patient not taking: Reported on 01/20/2022  ? Cathlyn Parsons, PA-C Not Taking Active Other  ?         ?Med Note (ATKINS, HEATHER L   Tue  Jan 20, 2022  1:40 PM) NP Thyroid per [facility]  ?Biotin 10 MG CAPS [id number] Yes Take 1 capsule by mouth 2 (two) times daily. [provider] 01/17/2022 pm Active Nursing Home Medication Administration Guide (MAG)  ?Black Cohosh 40 MG CAPS [id number] Yes Take 40 mg by mouth 2 (two) times daily. [provider] unknown Active Nursing Home Medication Administration Guide (MAG)  ?calcium carbonate (OSCAL) 1500 (600 Ca) MG TABS tablet [id number] Yes Take 1,500 mg by mouth every morning. [provider] 01/19/2022 Active Nursing Home Medication Administration Guide (MAG)  ?Cholecalciferol (VITAMIN D3) 50 MCG (2000 UT) TABS [id number] Yes Take 2,000-4,000 Units by mouth See admin instructions. Take one tablet (2000 units) by mouth every other day and take 2 tablets (4000 units) every other day [provider] 01/18/2022 Active Nursing Home Medication Administration Guide (MAG)  ?         ?Med Note ([place] Jan 20, 2022  1:23 PM) Per [facility] - 1 tablet given 01/18/22 and none given 01/17/22 and 01/19/22  ?clotrimazole (LOTRIMIN) 1 % cream [id number] Yes Apply 1 application. topically 3 (three) times daily. Apply to buttocks [provider] 01/19/2022 pm Active Nursing Home Medication Administration Guide (MAG)  ?Coenzyme Q10 (CO Q-10) 100 MG CAPS [id number] Yes Take 100 mg by mouth every morning. [provider] 01/18/2022 Active Nursing Home Medication Administration Guide (MAG)  ?Cranberry 500 MG CAPS [id number] Yes Take 500 mg by mouth 2 (two) times daily with a meal. [provider] 01/19/2022 pm Active  Nursing Home Medication Administration Guide (MAG)  ?Cyanocobalamin (VITAMIN B-12) 1000 MCG SUBL [id number] Yes Place 1,000 mcg under the tongue every morning. [provider] 01/19/2022 Active Nursing Home Medication Administration Guide (MAG)  ?fluticasone (FLONASE) 50 MCG/ACT nasal spray [id number] Yes Place 1 spray into both nostrils daily.  ?Patient taking differently:  Place 1 spray into both nostrils every morning.  ? Howard Pouch A, DO 01/19/2022 Active Nursing Home Medication Administration Guide (MAG)  ?glucosamine-chondroitin 500-400 MG tablet [id number] Yes Take 1 tablet by mouth 2 (two) times daily with a meal. [provider] 01/19/2022 pm Active Nursing Home Medication Administration Guide (MAG)  ?lisinopril (ZESTRIL) 5 MG tablet [id number] Yes Take 1 tablet (5 mg total) by mouth daily.  ?Patient taking differently: Take 5 mg by mouth every morning.  ? Freada Bergeron, MD 01/19/2022 Active Nursing Home Medication Administration Guide (MAG)  ?Lysine 500 MG CAPS [id number] Yes Take 1,000 mg by mouth 2 (two) times daily. [provider] unknown Active Nursing Home Medication Administration Guide (MAG)  ?         ?Med Note (ATKINS, [place] Jan 20, 2022  1:30 PM) Active order per [facility] but not given in April  ?Magnesium Citrate 100 MG TABS [id number] Yes Take 200 mg by mouth every morning. [provider] 01/17/2022 Active Nursing Home Medication Administration Guide (MAG)  ?Menthol-Zinc Oxide (CALMOSEPTINE) 0.44-20.6 % OINT [id number] Yes Apply 1 application. topically See admin instructions. Apply topically every shift - apply to buttocks/sacrum with each incontinent change. May keep at bedside. [provider] 01/19/2022 pm Active Nursing Home Medication Administration Guide (MAG)  ?metoprolol tartrate (LOPRESSOR) 25 MG tablet [id number] Yes TAKE 1 TAB (25 MG) PO TWICE DAILY - pt must make appt with provider for further refills - 1st attempt  ?Patient taking differently: Take 25 mg by mouth 2 (two) times daily.  ? Cathlyn Parsons, PA-C 01/19/2022 1700 Active Nursing Home Medication Administration Guide (MAG)  ?Nutritional Supplements (BOOST VHC) LIQD [id number] Yes Take 120 mLs by mouth 3 (three) times daily with meals. [provider] 01/19/2022 pm Active Nursing Home Medication Administration Guide (MAG)  ?Omega-3 Fatty Acids (FISH  OIL) 1200 MG CAPS [id number] Yes Take 1,200 mg by mouth 2 (two) times daily with a meal. [provider] 01/19/2022 pm Active Nursing Home Medication Administration Guide (MAG)  ?progesterone (PROMETRIUM) 100 MG capsule [id number] Yes Take 100 mg by mouth every morning. [provider] 01/19/2022 Active Nursing Home Medication Administration Guide (MAG)  ?thyroid (ARMOUR) 90 MG tablet [id number] Yes Take 90 mg by mouth every morning. [provider] 01/19/2022 Active Nursing Home Medication Administration Guide (MAG)  ?Turmeric 500 MG CAPS [id number] Yes Take 500 mg by mouth every evening. [provider] unknown Active Nursing Home Medication Administration Guide (MAG)  ?         ?Med Note ([place] Jan 20, 2022  1:36 PM) Active order per [facility] but not given in April ?  ?vitamin A 10000 UNIT capsule [id number] Yes Take 10,000 Units by mouth every evening. [provider] 01/17/2022 Active Nursing Home Medication Administration Guide (MAG)  ?vitamin E 400 UNIT capsule [id number] Yes Take 400 Units by mouth every evening. [provider] 01/18/2022 Active Nursing Home Medication Administration Guide (MAG)  ?warfarin (COUMADIN) 5 MG tablet [id number] Yes Take 1 tablet by mouth daily except 1.5 tablets on Sundays, Tuesdays, and Thursdays or as directed Anticoagulation Clinic.  ?Patient taking differently: Take  5-7.5 mg by mouth See admin instructions. Takes '5mg'$   (1 tablet) daily except 7.5 gm (1.5 tablets) on Tuesday, Thursday and Sunday  ? Freada Bergeron, MD 01/19/2022 1700 Active Nursing Home Medication Administration Guide (MAG)  ?Med List Note Payton Doughty, CPhT 01/20/22 1307): Resident of [facility] [phone]  ? ?  ?  ? ?  ? ? ? ?SDOH:  (Social Determinants of Health) assessments and interventions performed:  ? ? ?Care Plan ? ?Review of patient past medical history, allergies, medications, health status, including review of consultants reports,  laboratory and other test data, was performed as part of comprehensive evaluation for care management services.  ? ?There are no care plans that you recently modified to display for this patient. ?  ? ?Plan

## 2022-02-09 DIAGNOSIS — L89610 Pressure ulcer of right heel, unstageable: Secondary | ICD-10-CM | POA: Diagnosis not present

## 2022-02-14 DIAGNOSIS — N951 Menopausal and female climacteric states: Secondary | ICD-10-CM | POA: Diagnosis not present

## 2022-02-16 DIAGNOSIS — L89610 Pressure ulcer of right heel, unstageable: Secondary | ICD-10-CM | POA: Diagnosis not present

## 2022-02-18 DIAGNOSIS — S82141D Displaced bicondylar fracture of right tibia, subsequent encounter for closed fracture with routine healing: Secondary | ICD-10-CM | POA: Diagnosis not present

## 2022-02-18 DIAGNOSIS — S92352D Displaced fracture of fifth metatarsal bone, left foot, subsequent encounter for fracture with routine healing: Secondary | ICD-10-CM | POA: Diagnosis not present

## 2022-02-18 DIAGNOSIS — S8264XD Nondisplaced fracture of lateral malleolus of right fibula, subsequent encounter for closed fracture with routine healing: Secondary | ICD-10-CM | POA: Diagnosis not present

## 2022-02-18 DIAGNOSIS — S82111D Displaced fracture of right tibial spine, subsequent encounter for closed fracture with routine healing: Secondary | ICD-10-CM | POA: Diagnosis not present

## 2022-02-23 DIAGNOSIS — L89610 Pressure ulcer of right heel, unstageable: Secondary | ICD-10-CM | POA: Diagnosis not present

## 2022-02-27 DIAGNOSIS — I509 Heart failure, unspecified: Secondary | ICD-10-CM | POA: Diagnosis not present

## 2022-02-27 DIAGNOSIS — S82141A Displaced bicondylar fracture of right tibia, initial encounter for closed fracture: Secondary | ICD-10-CM | POA: Diagnosis not present

## 2022-02-27 DIAGNOSIS — E039 Hypothyroidism, unspecified: Secondary | ICD-10-CM | POA: Diagnosis not present

## 2022-02-27 DIAGNOSIS — D508 Other iron deficiency anemias: Secondary | ICD-10-CM | POA: Diagnosis not present

## 2022-02-27 DIAGNOSIS — I639 Cerebral infarction, unspecified: Secondary | ICD-10-CM | POA: Diagnosis not present

## 2022-02-27 DIAGNOSIS — I480 Paroxysmal atrial fibrillation: Secondary | ICD-10-CM | POA: Diagnosis not present

## 2022-02-27 DIAGNOSIS — I10 Essential (primary) hypertension: Secondary | ICD-10-CM | POA: Diagnosis not present

## 2022-02-28 DIAGNOSIS — Z952 Presence of prosthetic heart valve: Secondary | ICD-10-CM | POA: Diagnosis not present

## 2022-03-02 DIAGNOSIS — L89610 Pressure ulcer of right heel, unstageable: Secondary | ICD-10-CM | POA: Diagnosis not present

## 2022-03-06 ENCOUNTER — Other Ambulatory Visit: Payer: Self-pay | Admitting: *Deleted

## 2022-03-06 NOTE — Patient Outreach (Signed)
[facility]) Care Management Telephonic RN Care Manager Note   03/06/2022 Name:  [name] [name] MRN:  [id number] DOB:  Nov 03, 1950  Summary: Monthly follow up Outreach to patient She remains at [facility], therefore, she is presently not eligible for [facility] services as she is being followed by a care management services at [facility] facility after a fall 01/20/22 - right tibial plateau fracture  She was at [facility] until 01/21/22  She agreed on [facility] case closure services at this time   Recommendations/Changes made from today's visit: Follow up Outreach to patient Discussed continued skilled nursing facility (snf) care management services and concluding [facility] services  Encouraged her to review the mailed [facility] brochure and to have the facility staff/pcp refer her to [facility] after discharge Case closure     Subjective: [name] [name] is an 71 y.o. year old female who is a primary patient of Kuneff, Renee A, DO. The care management team was consulted for assistance with care management and/or care coordination needs.    Telephonic RN Care Manager completed Telephone Visit today.   Objective:  Medications Reviewed Today     Reviewed by Lynelle Doctor, RPH (Pharmacist) on 01/21/22 at [place] List Status: Complete   Medication Order Taking? Sig Documenting Provider Last Dose Status Informant  acetaminophen (TYLENOL) 325 MG tablet [id number] Yes Take 650 mg by mouth every 6 (six) hours. scheduled [provider] 01/20/2022 am Active Nursing Home Medication Administration Guide (MAG)  ACIDOPHILUS LACTOBACILLUS PO [id number] Yes Take 500 Million Cells by mouth every morning. [provider] 01/19/2022 Active Nursing Home Medication Administration Guide (MAG)  ARMOUR THYROID 90 MG tablet [id number] No Take 1 tablet (90 mg total) by mouth daily.  Patient not taking: Reported on 01/20/2022   Cathlyn Parsons, PA-C Not Taking Active Other            Med Note (ATKINS, [place] Jan 20, 2022  1:40 PM) NP Thyroid per [facility]  Biotin 10 MG CAPS [id number] Yes Take 1 capsule by mouth 2 (two) times daily. [provider] 01/17/2022 pm Active Nursing Home Medication Administration Guide (MAG)  Black Cohosh 40 MG CAPS [id number] Yes Take 40 mg by mouth 2 (two) times daily. [provider] unknown Active Nursing Home Medication Administration Guide (MAG)  calcium carbonate (OSCAL) 1500 (600 Ca) MG TABS tablet [id number] Yes Take 1,500 mg by mouth every morning. [provider] 01/19/2022 Active Nursing Home Medication Administration Guide (MAG)  Cholecalciferol (VITAMIN D3) 50 MCG (2000 UT) TABS [id number] Yes Take 2,000-4,000 Units by mouth See admin instructions. Take one tablet (2000 units) by mouth every other day and take 2 tablets (4000 units) every other day [provider] 01/18/2022 Active Nursing Home Medication Administration Guide (MAG)           Med Note (ATKINS, HEATHER L   Tue Jan 20, 2022  1:23 PM) Per [facility] - 1 tablet given 01/18/22 and none given 01/17/22 and 01/19/22  clotrimazole (LOTRIMIN) 1 % cream [id number] Yes Apply 1 application. topically 3 (three) times daily. Apply to buttocks [provider] 01/19/2022 pm Active Nursing Home Medication Administration Guide (MAG)  Coenzyme Q10 (CO Q-10) 100 MG CAPS [id number] Yes Take 100 mg by mouth every morning. [provider] 01/18/2022 Active Nursing Home Medication Administration Guide (MAG)  Cranberry 500 MG CAPS [id number] Yes Take 500 mg by mouth 2 (two) times daily with a meal. [provider] 01/19/2022 pm Active Nursing Home Medication Administration Guide (MAG)  Cyanocobalamin (VITAMIN B-12) 1000 MCG SUBL [id number] Yes Place 1,000 mcg under the tongue every morning. [provider] 01/19/2022 Active Nursing Home Medication Administration Guide (MAG)  fluticasone (FLONASE) 50 MCG/ACT nasal spray [id number] Yes Place 1 spray into both  nostrils daily.  Patient taking differently: Place 1 spray into both nostrils every morning.   Kuneff, Renee A, DO 01/19/2022 Active Nursing Home Medication Administration Guide (MAG)  glucosamine-chondroitin 500-400 MG tablet [id number] Yes Take 1 tablet by mouth 2 (two) times daily with a meal. [provider] 01/19/2022 pm Active Nursing Home Medication Administration Guide (MAG)  lisinopril (ZESTRIL) 5 MG tablet [id number] Yes Take 1 tablet (5 mg total) by mouth daily.  Patient taking differently: Take 5 mg by mouth every morning.   Freada Bergeron, MD 01/19/2022 Active Nursing Home Medication Administration Guide (MAG)  Lysine 500 MG CAPS [id number] Yes Take 1,000 mg by mouth 2 (two) times daily. [provider] unknown Active Nursing Home Medication Administration Guide (MAG)           Med Note ([place]   Tue Jan 20, 2022  1:30 PM) Active order per [facility] but not given in April  Magnesium Citrate 100 MG TABS [id number] Yes Take 200 mg by mouth every morning. [provider] 01/17/2022 Active Nursing Home Medication Administration Guide (MAG)  Menthol-Zinc Oxide (CALMOSEPTINE) 0.44-20.6 % OINT [id number] Yes Apply 1 application. topically See admin instructions. Apply topically every shift - apply to buttocks/sacrum with each incontinent change. May keep at bedside. [provider] 01/19/2022 pm Active Nursing Home Medication Administration Guide (MAG)  metoprolol tartrate (LOPRESSOR) 25 MG tablet [id number] Yes TAKE 1 TAB (25 MG) PO TWICE DAILY - pt must make appt with provider for further refills - 1st attempt  Patient taking differently: Take 25 mg by mouth 2 (two) times daily.   Cathlyn Parsons, PA-C 01/19/2022 1700 Active Nursing Home Medication Administration Guide (MAG)  Nutritional Supplements (BOOST VHC) LIQD [id number] Yes Take 120 mLs by mouth 3 (three) times daily with meals. [provider] 01/19/2022 pm Active Nursing Home Medication  Administration Guide (MAG)  Omega-3 Fatty Acids (FISH OIL) 1200 MG CAPS [id number] Yes Take 1,200 mg by mouth 2 (two) times daily with a meal. [provider] 01/19/2022 pm Active Nursing Home Medication Administration Guide (MAG)  progesterone (PROMETRIUM) 100 MG capsule [id number] Yes Take 100 mg by mouth every morning. [provider] 01/19/2022 Active Nursing Home Medication Administration Guide (MAG)  thyroid (ARMOUR) 90 MG tablet [id number] Yes Take 90 mg by mouth every morning. [provider] 01/19/2022 Active Nursing Home Medication Administration Guide (MAG)  Turmeric 500 MG CAPS [id number] Yes Take 500 mg by mouth every evening. [provider] unknown Active Nursing Home Medication Administration Guide (MAG)           Med Note ([place]   Tue Jan 20, 2022  1:36 PM) Active order per [facility] but not given in April   vitamin A 10000 UNIT capsule [id number] Yes Take 10,000 Units by mouth every evening. [provider] 01/17/2022 Active Nursing Home Medication Administration Guide (MAG)  vitamin E 400 UNIT capsule [id number] Yes Take 400 Units by mouth every evening. [provider] 01/18/2022 Active Nursing Home Medication Administration Guide (MAG)  warfarin (COUMADIN) 5 MG tablet [id number] Yes Take 1 tablet by mouth daily except 1.5 tablets on Sundays, Tuesdays, and Thursdays or as directed Anticoagulation Clinic.  Patient taking differently: Take 5-7.5 mg by mouth See admin instructions. Takes '5mg'$   (1 tablet) daily except 7.5 gm (1.5 tablets) on Tuesday, Thursday and Sunday   Freada Bergeron, MD 01/19/2022 1700 Active Nursing Home Medication Administration Guide (MAG)  Med List Note Payton Doughty, CPhT 01/20/22 1307): Resident of [facility] [phone]             SDOH:  (Social Determinants of Health) assessments and interventions performed:    Care Plan  Review of patient past medical history, allergies, medications,  health status, including review of consultants reports, laboratory and other test data, was performed as part of comprehensive evaluation for care management services.   There are no care plans that you recently modified to display for this patient.    Plan: case closure remains in skilled nursing facility with care management/SW services available The patient has been provided with contact information for the care management team and has been advised to call with any health related questions or concerns.   Maja Mccaffery L. Lavina Hamman, RN, BSN, [place] Coordinator Office number [phone] Main [facility] number [phone] Fax number [phone]

## 2022-03-26 DIAGNOSIS — E039 Hypothyroidism, unspecified: Secondary | ICD-10-CM | POA: Diagnosis not present

## 2022-03-26 DIAGNOSIS — I480 Paroxysmal atrial fibrillation: Secondary | ICD-10-CM | POA: Diagnosis not present

## 2022-03-26 DIAGNOSIS — I10 Essential (primary) hypertension: Secondary | ICD-10-CM | POA: Diagnosis not present

## 2022-03-26 DIAGNOSIS — I639 Cerebral infarction, unspecified: Secondary | ICD-10-CM | POA: Diagnosis not present

## 2022-03-26 DIAGNOSIS — I509 Heart failure, unspecified: Secondary | ICD-10-CM | POA: Diagnosis not present

## 2022-03-27 DIAGNOSIS — I10 Essential (primary) hypertension: Secondary | ICD-10-CM | POA: Diagnosis not present

## 2022-03-27 DIAGNOSIS — I480 Paroxysmal atrial fibrillation: Secondary | ICD-10-CM | POA: Diagnosis not present

## 2022-03-27 DIAGNOSIS — S82141A Displaced bicondylar fracture of right tibia, initial encounter for closed fracture: Secondary | ICD-10-CM | POA: Diagnosis not present

## 2022-03-27 DIAGNOSIS — D508 Other iron deficiency anemias: Secondary | ICD-10-CM | POA: Diagnosis not present

## 2022-03-27 DIAGNOSIS — E039 Hypothyroidism, unspecified: Secondary | ICD-10-CM | POA: Diagnosis not present

## 2022-03-31 ENCOUNTER — Telehealth: Payer: Self-pay

## 2022-03-31 NOTE — Telephone Encounter (Signed)
Pt would like to discuss no show fee. Pt is currently in rehab due to fall w/injury. She was not aware of appt and will call to schedule an appt with PCP after she gets out of rehab. Pt thinks that insurance or hosp made appt without her knowing.

## 2022-04-01 DIAGNOSIS — S82111D Displaced fracture of right tibial spine, subsequent encounter for closed fracture with routine healing: Secondary | ICD-10-CM | POA: Diagnosis not present

## 2022-04-01 DIAGNOSIS — S8264XD Nondisplaced fracture of lateral malleolus of right fibula, subsequent encounter for closed fracture with routine healing: Secondary | ICD-10-CM | POA: Diagnosis not present

## 2022-04-01 DIAGNOSIS — S82141D Displaced bicondylar fracture of right tibia, subsequent encounter for closed fracture with routine healing: Secondary | ICD-10-CM | POA: Diagnosis not present

## 2022-04-01 DIAGNOSIS — S92352D Displaced fracture of fifth metatarsal bone, left foot, subsequent encounter for fracture with routine healing: Secondary | ICD-10-CM | POA: Diagnosis not present

## 2022-04-12 DIAGNOSIS — I509 Heart failure, unspecified: Secondary | ICD-10-CM | POA: Diagnosis not present

## 2022-04-12 DIAGNOSIS — I480 Paroxysmal atrial fibrillation: Secondary | ICD-10-CM | POA: Diagnosis not present

## 2022-04-12 DIAGNOSIS — I639 Cerebral infarction, unspecified: Secondary | ICD-10-CM | POA: Diagnosis not present

## 2022-04-12 DIAGNOSIS — I10 Essential (primary) hypertension: Secondary | ICD-10-CM | POA: Diagnosis not present

## 2022-04-12 DIAGNOSIS — E039 Hypothyroidism, unspecified: Secondary | ICD-10-CM | POA: Diagnosis not present

## 2022-04-14 DIAGNOSIS — I272 Pulmonary hypertension, unspecified: Secondary | ICD-10-CM | POA: Diagnosis not present

## 2022-04-14 DIAGNOSIS — I110 Hypertensive heart disease with heart failure: Secondary | ICD-10-CM | POA: Diagnosis not present

## 2022-04-14 DIAGNOSIS — G35 Multiple sclerosis: Secondary | ICD-10-CM | POA: Diagnosis not present

## 2022-04-14 DIAGNOSIS — Z87440 Personal history of urinary (tract) infections: Secondary | ICD-10-CM | POA: Diagnosis not present

## 2022-04-14 DIAGNOSIS — I10 Essential (primary) hypertension: Secondary | ICD-10-CM | POA: Diagnosis not present

## 2022-04-14 DIAGNOSIS — J449 Chronic obstructive pulmonary disease, unspecified: Secondary | ICD-10-CM | POA: Diagnosis not present

## 2022-04-14 DIAGNOSIS — N319 Neuromuscular dysfunction of bladder, unspecified: Secondary | ICD-10-CM | POA: Diagnosis not present

## 2022-04-14 DIAGNOSIS — B029 Zoster without complications: Secondary | ICD-10-CM | POA: Diagnosis not present

## 2022-04-14 DIAGNOSIS — S82154D Nondisplaced fracture of right tibial tuberosity, subsequent encounter for closed fracture with routine healing: Secondary | ICD-10-CM | POA: Diagnosis not present

## 2022-04-14 DIAGNOSIS — N39498 Other specified urinary incontinence: Secondary | ICD-10-CM | POA: Diagnosis not present

## 2022-04-14 DIAGNOSIS — K633 Ulcer of intestine: Secondary | ICD-10-CM | POA: Diagnosis not present

## 2022-04-14 DIAGNOSIS — I480 Paroxysmal atrial fibrillation: Secondary | ICD-10-CM | POA: Diagnosis not present

## 2022-04-14 DIAGNOSIS — D649 Anemia, unspecified: Secondary | ICD-10-CM | POA: Diagnosis not present

## 2022-04-14 DIAGNOSIS — F419 Anxiety disorder, unspecified: Secondary | ICD-10-CM | POA: Diagnosis not present

## 2022-04-14 DIAGNOSIS — M170 Bilateral primary osteoarthritis of knee: Secondary | ICD-10-CM | POA: Diagnosis not present

## 2022-04-14 DIAGNOSIS — Z853 Personal history of malignant neoplasm of breast: Secondary | ICD-10-CM | POA: Diagnosis not present

## 2022-04-14 DIAGNOSIS — M810 Age-related osteoporosis without current pathological fracture: Secondary | ICD-10-CM | POA: Diagnosis not present

## 2022-04-14 DIAGNOSIS — Z9181 History of falling: Secondary | ICD-10-CM | POA: Diagnosis not present

## 2022-04-14 DIAGNOSIS — Z7901 Long term (current) use of anticoagulants: Secondary | ICD-10-CM | POA: Diagnosis not present

## 2022-04-14 DIAGNOSIS — Z8673 Personal history of transient ischemic attack (TIA), and cerebral infarction without residual deficits: Secondary | ICD-10-CM | POA: Diagnosis not present

## 2022-04-14 DIAGNOSIS — E039 Hypothyroidism, unspecified: Secondary | ICD-10-CM | POA: Diagnosis not present

## 2022-04-14 DIAGNOSIS — I5032 Chronic diastolic (congestive) heart failure: Secondary | ICD-10-CM | POA: Diagnosis not present

## 2022-04-14 DIAGNOSIS — K589 Irritable bowel syndrome without diarrhea: Secondary | ICD-10-CM | POA: Diagnosis not present

## 2022-04-14 DIAGNOSIS — R159 Full incontinence of feces: Secondary | ICD-10-CM | POA: Diagnosis not present

## 2022-04-15 ENCOUNTER — Telehealth: Payer: Self-pay

## 2022-04-15 DIAGNOSIS — I5032 Chronic diastolic (congestive) heart failure: Secondary | ICD-10-CM | POA: Diagnosis not present

## 2022-04-15 DIAGNOSIS — S82154D Nondisplaced fracture of right tibial tuberosity, subsequent encounter for closed fracture with routine healing: Secondary | ICD-10-CM | POA: Diagnosis not present

## 2022-04-15 DIAGNOSIS — E039 Hypothyroidism, unspecified: Secondary | ICD-10-CM | POA: Diagnosis not present

## 2022-04-15 DIAGNOSIS — M810 Age-related osteoporosis without current pathological fracture: Secondary | ICD-10-CM | POA: Diagnosis not present

## 2022-04-15 DIAGNOSIS — I110 Hypertensive heart disease with heart failure: Secondary | ICD-10-CM | POA: Diagnosis not present

## 2022-04-15 DIAGNOSIS — I480 Paroxysmal atrial fibrillation: Secondary | ICD-10-CM | POA: Diagnosis not present

## 2022-04-15 NOTE — Telephone Encounter (Signed)
Patient will need to be seen by PCP to sign any further orders.  She typically is seen by health coach once a year for her Medicare wellness.  But if requesting DME's and further home health orders, then this provider would need to see her in person. Where she can get her surgical team to sign for order since they have seen her in the last 3 months.

## 2022-04-15 NOTE — Telephone Encounter (Signed)
Home Health verbal orders  Caller Name: Johnney Ou  Agency Name:  [facility] number:  [phone]  Requesting OT/PT/Skilled nursing/Social Work/Speech: N/A  Reason:  Requesting bedside commode - emergent request  Frequency: N/A  Patient is in urgent need of bedside commode.  3 n 1 bedside commode Please fax copy of patient demographics with prescription.  [facility] Fax [phone]

## 2022-04-15 NOTE — Telephone Encounter (Signed)
LVM for Claiborne Billings, RN to cb v/o.   Note: Pt has not been seen since hos visit. Pt acyually hasn't been seen in almost a year. Pt must have been seen in the last 90 days for v/o approval. Sending to PCP as FYI.

## 2022-04-16 DIAGNOSIS — I480 Paroxysmal atrial fibrillation: Secondary | ICD-10-CM | POA: Diagnosis not present

## 2022-04-16 DIAGNOSIS — M810 Age-related osteoporosis without current pathological fracture: Secondary | ICD-10-CM | POA: Diagnosis not present

## 2022-04-16 DIAGNOSIS — I110 Hypertensive heart disease with heart failure: Secondary | ICD-10-CM | POA: Diagnosis not present

## 2022-04-16 DIAGNOSIS — E039 Hypothyroidism, unspecified: Secondary | ICD-10-CM | POA: Diagnosis not present

## 2022-04-16 DIAGNOSIS — I5032 Chronic diastolic (congestive) heart failure: Secondary | ICD-10-CM | POA: Diagnosis not present

## 2022-04-16 DIAGNOSIS — S82154D Nondisplaced fracture of right tibial tuberosity, subsequent encounter for closed fracture with routine healing: Secondary | ICD-10-CM | POA: Diagnosis not present

## 2022-04-16 NOTE — Telephone Encounter (Signed)
[name] [name] called back from [place], I told her that patient would need to be seen, patient has not been seen by Dr. Raoul Pitch in almost a year. [name] [name] will contact surgical team OR have patient to go purchase bedside commode at equipment supply store.    No need to call back.

## 2022-04-17 DIAGNOSIS — I5032 Chronic diastolic (congestive) heart failure: Secondary | ICD-10-CM | POA: Diagnosis not present

## 2022-04-17 DIAGNOSIS — I480 Paroxysmal atrial fibrillation: Secondary | ICD-10-CM | POA: Diagnosis not present

## 2022-04-17 DIAGNOSIS — E039 Hypothyroidism, unspecified: Secondary | ICD-10-CM | POA: Diagnosis not present

## 2022-04-17 DIAGNOSIS — I110 Hypertensive heart disease with heart failure: Secondary | ICD-10-CM | POA: Diagnosis not present

## 2022-04-17 DIAGNOSIS — S82154D Nondisplaced fracture of right tibial tuberosity, subsequent encounter for closed fracture with routine healing: Secondary | ICD-10-CM | POA: Diagnosis not present

## 2022-04-17 DIAGNOSIS — M810 Age-related osteoporosis without current pathological fracture: Secondary | ICD-10-CM | POA: Diagnosis not present

## 2022-04-20 DIAGNOSIS — I480 Paroxysmal atrial fibrillation: Secondary | ICD-10-CM | POA: Diagnosis not present

## 2022-04-20 DIAGNOSIS — I5032 Chronic diastolic (congestive) heart failure: Secondary | ICD-10-CM | POA: Diagnosis not present

## 2022-04-20 DIAGNOSIS — M810 Age-related osteoporosis without current pathological fracture: Secondary | ICD-10-CM | POA: Diagnosis not present

## 2022-04-20 DIAGNOSIS — I110 Hypertensive heart disease with heart failure: Secondary | ICD-10-CM | POA: Diagnosis not present

## 2022-04-20 DIAGNOSIS — E039 Hypothyroidism, unspecified: Secondary | ICD-10-CM | POA: Diagnosis not present

## 2022-04-20 DIAGNOSIS — S82154D Nondisplaced fracture of right tibial tuberosity, subsequent encounter for closed fracture with routine healing: Secondary | ICD-10-CM | POA: Diagnosis not present

## 2022-04-22 ENCOUNTER — Telehealth: Payer: Self-pay | Admitting: Cardiology

## 2022-04-22 DIAGNOSIS — S82154D Nondisplaced fracture of right tibial tuberosity, subsequent encounter for closed fracture with routine healing: Secondary | ICD-10-CM | POA: Diagnosis not present

## 2022-04-22 DIAGNOSIS — I5032 Chronic diastolic (congestive) heart failure: Secondary | ICD-10-CM | POA: Diagnosis not present

## 2022-04-22 DIAGNOSIS — I110 Hypertensive heart disease with heart failure: Secondary | ICD-10-CM | POA: Diagnosis not present

## 2022-04-22 DIAGNOSIS — I480 Paroxysmal atrial fibrillation: Secondary | ICD-10-CM | POA: Diagnosis not present

## 2022-04-22 DIAGNOSIS — M810 Age-related osteoporosis without current pathological fracture: Secondary | ICD-10-CM | POA: Diagnosis not present

## 2022-04-22 DIAGNOSIS — E039 Hypothyroidism, unspecified: Secondary | ICD-10-CM | POA: Diagnosis not present

## 2022-04-22 NOTE — Telephone Encounter (Signed)
[place] to confirm that the pt is receiving nursing services and to give an order. Spoke with Sherri and she stated they went out to see the pt today and will go back out next week. She stated they have that the pt is taking warfarin '4mg'$  daily, advised we have not seen her in since she went to the hospital then rehab.   Spoke with Gennie Alma RN and gave a verbal order to check the pt's INR on Monday, 04/27/22, at their next visit.

## 2022-04-22 NOTE — Telephone Encounter (Signed)
If Home Health RN is calling please get Coumadin Nurse on the phone STAT  1.  Are you calling in regards to an appointment? No  2.  Are you calling for a refill ? No  3.  Are you having bleeding issues? No  4.  Do you need clearance to hold Coumadin? No  Pt is needing an order faxed over for her to be checked for coumadin at home with [facility].  Fax # [phone]   Please route to the Coumadin Clinic Pool

## 2022-04-23 ENCOUNTER — Telehealth: Payer: Self-pay | Admitting: Cardiology

## 2022-04-23 DIAGNOSIS — S82154D Nondisplaced fracture of right tibial tuberosity, subsequent encounter for closed fracture with routine healing: Secondary | ICD-10-CM | POA: Diagnosis not present

## 2022-04-23 DIAGNOSIS — M810 Age-related osteoporosis without current pathological fracture: Secondary | ICD-10-CM | POA: Diagnosis not present

## 2022-04-23 DIAGNOSIS — E039 Hypothyroidism, unspecified: Secondary | ICD-10-CM | POA: Diagnosis not present

## 2022-04-23 DIAGNOSIS — I480 Paroxysmal atrial fibrillation: Secondary | ICD-10-CM | POA: Diagnosis not present

## 2022-04-23 DIAGNOSIS — I110 Hypertensive heart disease with heart failure: Secondary | ICD-10-CM | POA: Diagnosis not present

## 2022-04-23 DIAGNOSIS — I5032 Chronic diastolic (congestive) heart failure: Secondary | ICD-10-CM | POA: Diagnosis not present

## 2022-04-23 NOTE — Telephone Encounter (Signed)
Returned call to the pt and had to leave a message that the order for checking the INR while in the home by home Health was set up on yesterday. I spoke directly with [place] regarding this on yesterday (see previous note) [facility] will notify her on the time, etc with her.

## 2022-04-23 NOTE — Telephone Encounter (Signed)
Patient needs INR orders sent to [facility] to [phone]. Patient would also like a phone call back

## 2022-04-23 NOTE — Telephone Encounter (Signed)
Patient needs INR orders sent to [facility] to [phone]. Patient would also like a phone call back

## 2022-04-25 DIAGNOSIS — I110 Hypertensive heart disease with heart failure: Secondary | ICD-10-CM | POA: Diagnosis not present

## 2022-04-25 DIAGNOSIS — E039 Hypothyroidism, unspecified: Secondary | ICD-10-CM | POA: Diagnosis not present

## 2022-04-25 DIAGNOSIS — I480 Paroxysmal atrial fibrillation: Secondary | ICD-10-CM | POA: Diagnosis not present

## 2022-04-25 DIAGNOSIS — I5032 Chronic diastolic (congestive) heart failure: Secondary | ICD-10-CM | POA: Diagnosis not present

## 2022-04-25 DIAGNOSIS — M810 Age-related osteoporosis without current pathological fracture: Secondary | ICD-10-CM | POA: Diagnosis not present

## 2022-04-25 DIAGNOSIS — S82154D Nondisplaced fracture of right tibial tuberosity, subsequent encounter for closed fracture with routine healing: Secondary | ICD-10-CM | POA: Diagnosis not present

## 2022-04-27 ENCOUNTER — Telehealth: Payer: Self-pay

## 2022-04-27 ENCOUNTER — Ambulatory Visit (INDEPENDENT_AMBULATORY_CARE_PROVIDER_SITE_OTHER): Payer: Medicare Other | Admitting: *Deleted

## 2022-04-27 DIAGNOSIS — I110 Hypertensive heart disease with heart failure: Secondary | ICD-10-CM | POA: Diagnosis not present

## 2022-04-27 DIAGNOSIS — E039 Hypothyroidism, unspecified: Secondary | ICD-10-CM | POA: Diagnosis not present

## 2022-04-27 DIAGNOSIS — Z8679 Personal history of other diseases of the circulatory system: Secondary | ICD-10-CM

## 2022-04-27 DIAGNOSIS — M810 Age-related osteoporosis without current pathological fracture: Secondary | ICD-10-CM | POA: Diagnosis not present

## 2022-04-27 DIAGNOSIS — I5032 Chronic diastolic (congestive) heart failure: Secondary | ICD-10-CM | POA: Diagnosis not present

## 2022-04-27 DIAGNOSIS — Z9889 Other specified postprocedural states: Secondary | ICD-10-CM

## 2022-04-27 DIAGNOSIS — Z5181 Encounter for therapeutic drug level monitoring: Secondary | ICD-10-CM | POA: Diagnosis not present

## 2022-04-27 DIAGNOSIS — Z952 Presence of prosthetic heart valve: Secondary | ICD-10-CM | POA: Diagnosis not present

## 2022-04-27 DIAGNOSIS — I480 Paroxysmal atrial fibrillation: Secondary | ICD-10-CM | POA: Diagnosis not present

## 2022-04-27 DIAGNOSIS — S82154D Nondisplaced fracture of right tibial tuberosity, subsequent encounter for closed fracture with routine healing: Secondary | ICD-10-CM | POA: Diagnosis not present

## 2022-04-27 LAB — POCT INR: INR: 1.2 — AB (ref 2.0–3.0)

## 2022-04-27 NOTE — Telephone Encounter (Signed)
Called sherri; sherri to call back   [place] Day - Client Nonclinical Telephone Record  Technical brewer Primary [facility] Day - Client Client Site [place] - Day Provider [place], [place] Type Call Who Is [place] / [place] Name [facility] Phone Number Declined to provide Patient Name Arriona Prest Patient DOB May 18, 1951 Facility Number [place] Name [place] Call [place] Message Only Reason for Call Request to speak to Physician Initial Comment Caller is a nurse with home health and has a patient whose Cardiologist who called in test orders and they need permission from PCP to test. Disp. Time Disposition Final User 04/23/2022 1:49:09 PM General Information Provided Yes Launa Grill Call Closed By: Launa Grill Transaction Date/Time: 04/23/2022 1:44:28 PM (ET)

## 2022-04-28 DIAGNOSIS — E039 Hypothyroidism, unspecified: Secondary | ICD-10-CM | POA: Diagnosis not present

## 2022-04-28 DIAGNOSIS — S82154D Nondisplaced fracture of right tibial tuberosity, subsequent encounter for closed fracture with routine healing: Secondary | ICD-10-CM | POA: Diagnosis not present

## 2022-04-28 DIAGNOSIS — I110 Hypertensive heart disease with heart failure: Secondary | ICD-10-CM | POA: Diagnosis not present

## 2022-04-28 DIAGNOSIS — M810 Age-related osteoporosis without current pathological fracture: Secondary | ICD-10-CM | POA: Diagnosis not present

## 2022-04-28 DIAGNOSIS — I5032 Chronic diastolic (congestive) heart failure: Secondary | ICD-10-CM | POA: Diagnosis not present

## 2022-04-28 DIAGNOSIS — I480 Paroxysmal atrial fibrillation: Secondary | ICD-10-CM | POA: Diagnosis not present

## 2022-04-29 DIAGNOSIS — S82154D Nondisplaced fracture of right tibial tuberosity, subsequent encounter for closed fracture with routine healing: Secondary | ICD-10-CM | POA: Diagnosis not present

## 2022-04-29 DIAGNOSIS — I110 Hypertensive heart disease with heart failure: Secondary | ICD-10-CM | POA: Diagnosis not present

## 2022-04-29 DIAGNOSIS — I480 Paroxysmal atrial fibrillation: Secondary | ICD-10-CM | POA: Diagnosis not present

## 2022-04-29 DIAGNOSIS — M810 Age-related osteoporosis without current pathological fracture: Secondary | ICD-10-CM | POA: Diagnosis not present

## 2022-04-29 DIAGNOSIS — E039 Hypothyroidism, unspecified: Secondary | ICD-10-CM | POA: Diagnosis not present

## 2022-04-29 DIAGNOSIS — I5032 Chronic diastolic (congestive) heart failure: Secondary | ICD-10-CM | POA: Diagnosis not present

## 2022-05-04 ENCOUNTER — Ambulatory Visit (INDEPENDENT_AMBULATORY_CARE_PROVIDER_SITE_OTHER): Payer: Medicare Other

## 2022-05-04 DIAGNOSIS — E039 Hypothyroidism, unspecified: Secondary | ICD-10-CM | POA: Diagnosis not present

## 2022-05-04 DIAGNOSIS — Z952 Presence of prosthetic heart valve: Secondary | ICD-10-CM | POA: Diagnosis not present

## 2022-05-04 DIAGNOSIS — Z5181 Encounter for therapeutic drug level monitoring: Secondary | ICD-10-CM

## 2022-05-04 DIAGNOSIS — M810 Age-related osteoporosis without current pathological fracture: Secondary | ICD-10-CM | POA: Diagnosis not present

## 2022-05-04 DIAGNOSIS — I5032 Chronic diastolic (congestive) heart failure: Secondary | ICD-10-CM | POA: Diagnosis not present

## 2022-05-04 DIAGNOSIS — Z8679 Personal history of other diseases of the circulatory system: Secondary | ICD-10-CM | POA: Diagnosis not present

## 2022-05-04 DIAGNOSIS — I110 Hypertensive heart disease with heart failure: Secondary | ICD-10-CM | POA: Diagnosis not present

## 2022-05-04 DIAGNOSIS — Z9889 Other specified postprocedural states: Secondary | ICD-10-CM

## 2022-05-04 DIAGNOSIS — I480 Paroxysmal atrial fibrillation: Secondary | ICD-10-CM | POA: Diagnosis not present

## 2022-05-04 DIAGNOSIS — S82154D Nondisplaced fracture of right tibial tuberosity, subsequent encounter for closed fracture with routine healing: Secondary | ICD-10-CM | POA: Diagnosis not present

## 2022-05-04 LAB — POCT INR: INR: 2.2 (ref 2.0–3.0)

## 2022-05-04 NOTE — Patient Instructions (Signed)
Description   Spoke with Aldona Bar, [place] and advised to take 2 tablets of warfarin today and then continue taking 1 tablet daily.  Recheck INR in 1 week. Coumadin Clinic [phone].

## 2022-05-05 DIAGNOSIS — I5032 Chronic diastolic (congestive) heart failure: Secondary | ICD-10-CM | POA: Diagnosis not present

## 2022-05-05 DIAGNOSIS — I480 Paroxysmal atrial fibrillation: Secondary | ICD-10-CM | POA: Diagnosis not present

## 2022-05-05 DIAGNOSIS — E039 Hypothyroidism, unspecified: Secondary | ICD-10-CM | POA: Diagnosis not present

## 2022-05-05 DIAGNOSIS — M810 Age-related osteoporosis without current pathological fracture: Secondary | ICD-10-CM | POA: Diagnosis not present

## 2022-05-05 DIAGNOSIS — I110 Hypertensive heart disease with heart failure: Secondary | ICD-10-CM | POA: Diagnosis not present

## 2022-05-05 DIAGNOSIS — S82154D Nondisplaced fracture of right tibial tuberosity, subsequent encounter for closed fracture with routine healing: Secondary | ICD-10-CM | POA: Diagnosis not present

## 2022-05-06 DIAGNOSIS — I480 Paroxysmal atrial fibrillation: Secondary | ICD-10-CM | POA: Diagnosis not present

## 2022-05-06 DIAGNOSIS — I5032 Chronic diastolic (congestive) heart failure: Secondary | ICD-10-CM | POA: Diagnosis not present

## 2022-05-06 DIAGNOSIS — I110 Hypertensive heart disease with heart failure: Secondary | ICD-10-CM | POA: Diagnosis not present

## 2022-05-06 DIAGNOSIS — M810 Age-related osteoporosis without current pathological fracture: Secondary | ICD-10-CM | POA: Diagnosis not present

## 2022-05-06 DIAGNOSIS — E039 Hypothyroidism, unspecified: Secondary | ICD-10-CM | POA: Diagnosis not present

## 2022-05-06 DIAGNOSIS — S82154D Nondisplaced fracture of right tibial tuberosity, subsequent encounter for closed fracture with routine healing: Secondary | ICD-10-CM | POA: Diagnosis not present

## 2022-05-11 ENCOUNTER — Telehealth: Payer: Self-pay

## 2022-05-11 DIAGNOSIS — S82154D Nondisplaced fracture of right tibial tuberosity, subsequent encounter for closed fracture with routine healing: Secondary | ICD-10-CM | POA: Diagnosis not present

## 2022-05-11 DIAGNOSIS — M810 Age-related osteoporosis without current pathological fracture: Secondary | ICD-10-CM | POA: Diagnosis not present

## 2022-05-11 DIAGNOSIS — I480 Paroxysmal atrial fibrillation: Secondary | ICD-10-CM | POA: Diagnosis not present

## 2022-05-11 DIAGNOSIS — I110 Hypertensive heart disease with heart failure: Secondary | ICD-10-CM | POA: Diagnosis not present

## 2022-05-11 DIAGNOSIS — I5032 Chronic diastolic (congestive) heart failure: Secondary | ICD-10-CM | POA: Diagnosis not present

## 2022-05-11 DIAGNOSIS — E039 Hypothyroidism, unspecified: Secondary | ICD-10-CM | POA: Diagnosis not present

## 2022-05-11 NOTE — Telephone Encounter (Signed)
Lpm to check if [facility] had checked INR today.

## 2022-05-12 DIAGNOSIS — I480 Paroxysmal atrial fibrillation: Secondary | ICD-10-CM | POA: Diagnosis not present

## 2022-05-12 DIAGNOSIS — E039 Hypothyroidism, unspecified: Secondary | ICD-10-CM | POA: Diagnosis not present

## 2022-05-12 DIAGNOSIS — S82154D Nondisplaced fracture of right tibial tuberosity, subsequent encounter for closed fracture with routine healing: Secondary | ICD-10-CM | POA: Diagnosis not present

## 2022-05-12 DIAGNOSIS — I5032 Chronic diastolic (congestive) heart failure: Secondary | ICD-10-CM | POA: Diagnosis not present

## 2022-05-12 DIAGNOSIS — M810 Age-related osteoporosis without current pathological fracture: Secondary | ICD-10-CM | POA: Diagnosis not present

## 2022-05-12 DIAGNOSIS — I110 Hypertensive heart disease with heart failure: Secondary | ICD-10-CM | POA: Diagnosis not present

## 2022-05-13 DIAGNOSIS — S82154D Nondisplaced fracture of right tibial tuberosity, subsequent encounter for closed fracture with routine healing: Secondary | ICD-10-CM | POA: Diagnosis not present

## 2022-05-13 DIAGNOSIS — M810 Age-related osteoporosis without current pathological fracture: Secondary | ICD-10-CM | POA: Diagnosis not present

## 2022-05-13 DIAGNOSIS — E039 Hypothyroidism, unspecified: Secondary | ICD-10-CM | POA: Diagnosis not present

## 2022-05-13 DIAGNOSIS — I5032 Chronic diastolic (congestive) heart failure: Secondary | ICD-10-CM | POA: Diagnosis not present

## 2022-05-13 DIAGNOSIS — I110 Hypertensive heart disease with heart failure: Secondary | ICD-10-CM | POA: Diagnosis not present

## 2022-05-13 DIAGNOSIS — I480 Paroxysmal atrial fibrillation: Secondary | ICD-10-CM | POA: Diagnosis not present

## 2022-05-14 ENCOUNTER — Ambulatory Visit (INDEPENDENT_AMBULATORY_CARE_PROVIDER_SITE_OTHER): Payer: Medicare Other

## 2022-05-14 DIAGNOSIS — I272 Pulmonary hypertension, unspecified: Secondary | ICD-10-CM | POA: Diagnosis not present

## 2022-05-14 DIAGNOSIS — E039 Hypothyroidism, unspecified: Secondary | ICD-10-CM | POA: Diagnosis not present

## 2022-05-14 DIAGNOSIS — Z7901 Long term (current) use of anticoagulants: Secondary | ICD-10-CM | POA: Diagnosis not present

## 2022-05-14 DIAGNOSIS — I480 Paroxysmal atrial fibrillation: Secondary | ICD-10-CM | POA: Diagnosis not present

## 2022-05-14 DIAGNOSIS — K589 Irritable bowel syndrome without diarrhea: Secondary | ICD-10-CM | POA: Diagnosis not present

## 2022-05-14 DIAGNOSIS — F419 Anxiety disorder, unspecified: Secondary | ICD-10-CM | POA: Diagnosis not present

## 2022-05-14 DIAGNOSIS — Z87440 Personal history of urinary (tract) infections: Secondary | ICD-10-CM | POA: Diagnosis not present

## 2022-05-14 DIAGNOSIS — I110 Hypertensive heart disease with heart failure: Secondary | ICD-10-CM | POA: Diagnosis not present

## 2022-05-14 DIAGNOSIS — M810 Age-related osteoporosis without current pathological fracture: Secondary | ICD-10-CM | POA: Diagnosis not present

## 2022-05-14 DIAGNOSIS — G35 Multiple sclerosis: Secondary | ICD-10-CM | POA: Diagnosis not present

## 2022-05-14 DIAGNOSIS — Z5181 Encounter for therapeutic drug level monitoring: Secondary | ICD-10-CM

## 2022-05-14 DIAGNOSIS — J449 Chronic obstructive pulmonary disease, unspecified: Secondary | ICD-10-CM | POA: Diagnosis not present

## 2022-05-14 DIAGNOSIS — S82154D Nondisplaced fracture of right tibial tuberosity, subsequent encounter for closed fracture with routine healing: Secondary | ICD-10-CM | POA: Diagnosis not present

## 2022-05-14 DIAGNOSIS — R159 Full incontinence of feces: Secondary | ICD-10-CM | POA: Diagnosis not present

## 2022-05-14 DIAGNOSIS — K633 Ulcer of intestine: Secondary | ICD-10-CM | POA: Diagnosis not present

## 2022-05-14 DIAGNOSIS — I10 Essential (primary) hypertension: Secondary | ICD-10-CM | POA: Diagnosis not present

## 2022-05-14 DIAGNOSIS — Z8673 Personal history of transient ischemic attack (TIA), and cerebral infarction without residual deficits: Secondary | ICD-10-CM | POA: Diagnosis not present

## 2022-05-14 DIAGNOSIS — D649 Anemia, unspecified: Secondary | ICD-10-CM | POA: Diagnosis not present

## 2022-05-14 DIAGNOSIS — N39498 Other specified urinary incontinence: Secondary | ICD-10-CM | POA: Diagnosis not present

## 2022-05-14 DIAGNOSIS — M170 Bilateral primary osteoarthritis of knee: Secondary | ICD-10-CM | POA: Diagnosis not present

## 2022-05-14 DIAGNOSIS — I5032 Chronic diastolic (congestive) heart failure: Secondary | ICD-10-CM | POA: Diagnosis not present

## 2022-05-14 DIAGNOSIS — B029 Zoster without complications: Secondary | ICD-10-CM | POA: Diagnosis not present

## 2022-05-14 DIAGNOSIS — N319 Neuromuscular dysfunction of bladder, unspecified: Secondary | ICD-10-CM | POA: Diagnosis not present

## 2022-05-14 DIAGNOSIS — Z853 Personal history of malignant neoplasm of breast: Secondary | ICD-10-CM | POA: Diagnosis not present

## 2022-05-14 DIAGNOSIS — Z9181 History of falling: Secondary | ICD-10-CM | POA: Diagnosis not present

## 2022-05-14 LAB — POCT INR: INR: 1.1 — AB (ref 2.0–3.0)

## 2022-05-14 NOTE — Patient Instructions (Signed)
Description   Spoke with Claiborne Billings at [facility] and advised to take 2 tablets of warfarin today and 2 tablets tomorrow and then continue taking 1 tablet daily.  Recheck INR in 5 days. Coumadin Clinic [phone].

## 2022-05-19 DIAGNOSIS — E039 Hypothyroidism, unspecified: Secondary | ICD-10-CM | POA: Diagnosis not present

## 2022-05-19 DIAGNOSIS — I110 Hypertensive heart disease with heart failure: Secondary | ICD-10-CM | POA: Diagnosis not present

## 2022-05-19 DIAGNOSIS — I5032 Chronic diastolic (congestive) heart failure: Secondary | ICD-10-CM | POA: Diagnosis not present

## 2022-05-19 DIAGNOSIS — S82154D Nondisplaced fracture of right tibial tuberosity, subsequent encounter for closed fracture with routine healing: Secondary | ICD-10-CM | POA: Diagnosis not present

## 2022-05-19 DIAGNOSIS — M810 Age-related osteoporosis without current pathological fracture: Secondary | ICD-10-CM | POA: Diagnosis not present

## 2022-05-19 DIAGNOSIS — I480 Paroxysmal atrial fibrillation: Secondary | ICD-10-CM | POA: Diagnosis not present

## 2022-05-20 ENCOUNTER — Ambulatory Visit (INDEPENDENT_AMBULATORY_CARE_PROVIDER_SITE_OTHER): Payer: Medicare Other | Admitting: *Deleted

## 2022-05-20 ENCOUNTER — Ambulatory Visit (INDEPENDENT_AMBULATORY_CARE_PROVIDER_SITE_OTHER): Payer: Medicare Other

## 2022-05-20 DIAGNOSIS — Z0000 Encounter for general adult medical examination without abnormal findings: Secondary | ICD-10-CM

## 2022-05-20 DIAGNOSIS — Z9889 Other specified postprocedural states: Secondary | ICD-10-CM | POA: Diagnosis not present

## 2022-05-20 DIAGNOSIS — Z952 Presence of prosthetic heart valve: Secondary | ICD-10-CM | POA: Diagnosis not present

## 2022-05-20 DIAGNOSIS — I5032 Chronic diastolic (congestive) heart failure: Secondary | ICD-10-CM | POA: Diagnosis not present

## 2022-05-20 DIAGNOSIS — I480 Paroxysmal atrial fibrillation: Secondary | ICD-10-CM | POA: Diagnosis not present

## 2022-05-20 DIAGNOSIS — Z5181 Encounter for therapeutic drug level monitoring: Secondary | ICD-10-CM

## 2022-05-20 DIAGNOSIS — Z8679 Personal history of other diseases of the circulatory system: Secondary | ICD-10-CM

## 2022-05-20 DIAGNOSIS — S82154D Nondisplaced fracture of right tibial tuberosity, subsequent encounter for closed fracture with routine healing: Secondary | ICD-10-CM | POA: Diagnosis not present

## 2022-05-20 DIAGNOSIS — I110 Hypertensive heart disease with heart failure: Secondary | ICD-10-CM | POA: Diagnosis not present

## 2022-05-20 DIAGNOSIS — E039 Hypothyroidism, unspecified: Secondary | ICD-10-CM | POA: Diagnosis not present

## 2022-05-20 DIAGNOSIS — M810 Age-related osteoporosis without current pathological fracture: Secondary | ICD-10-CM | POA: Diagnosis not present

## 2022-05-20 LAB — POCT INR: INR: 2.4 (ref 2.0–3.0)

## 2022-05-20 NOTE — Patient Instructions (Signed)
Description   Spoke with Claiborne Billings at [facility] and advised to take 2 tablets of warfarin today  then continue taking 1 tablet daily. Recheck INR in 1 week. Coumadin Clinic [phone].

## 2022-05-20 NOTE — Patient Instructions (Signed)
Ms. [name] [name] , Thank you for taking time to come for your Medicare Wellness Visit. I appreciate your ongoing commitment to your health goals. Please review the following plan we discussed and let me know if I can assist you in the future.   Screening recommendations/referrals: Colonoscopy: done cologuard 10/24/19 repeat every 3 years  Mammogram: done 08/07/21 repeat every year  Bone Density: done 08/07/21 repeat every 2 years  Recommended yearly ophthalmology/optometry visit for glaucoma screening and checkup Recommended yearly dental visit for hygiene and checkup  Vaccinations: Influenza vaccine: discontinued  Pneumococcal vaccine:discontinued  Tdap vaccine: due  Shingles vaccine: Shingrix discussed. Please contact your pharmacy for coverage information.    Covid-19:discontinued   Advanced directives: copies in chart   Conditions/risks identified: none at this time   Next appointment: Follow up in one year for your annual wellness visit    Preventive Care 65 Years and Older, Female Preventive care refers to lifestyle choices and visits with your health care provider that can promote health and wellness. What does preventive care include? A yearly physical exam. This is also called an annual well check. Dental exams once or twice a year. Routine eye exams. Ask your health care provider how often you should have your eyes checked. Personal lifestyle choices, including: Daily care of your teeth and gums. Regular physical activity. Eating a healthy diet. Avoiding tobacco and drug use. Limiting alcohol use. Practicing safe sex. Taking low-dose aspirin every day. Taking vitamin and mineral supplements as recommended by your health care provider. What happens during an annual well check? The services and screenings done by your health care provider during your annual well check will depend on your age, overall health, lifestyle risk factors, and family history of disease. Counseling   Your health care provider may ask you questions about your: Alcohol use. Tobacco use. Drug use. Emotional well-being. Home and relationship well-being. Sexual activity. Eating habits. History of falls. Memory and ability to understand (cognition). Work and work Statistician. Reproductive health. Screening  You may have the following tests or measurements: Height, weight, and BMI. Blood pressure. Lipid and cholesterol levels. These may be checked every 5 years, or more frequently if you are over 11 years old. Skin check. Lung cancer screening. You may have this screening every year starting at age 73 if you have a 30-pack-year history of smoking and currently smoke or have quit within the past 15 years. Fecal occult blood test (FOBT) of the stool. You may have this test every year starting at age 73. Flexible sigmoidoscopy or colonoscopy. You may have a sigmoidoscopy every 5 years or a colonoscopy every 10 years starting at age 73. Hepatitis C blood test. Hepatitis B blood test. Sexually transmitted disease (STD) testing. Diabetes screening. This is done by checking your blood sugar (glucose) after you have not eaten for a while (fasting). You may have this done every 1-3 years. Bone density scan. This is done to screen for osteoporosis. You may have this done starting at age 73. Mammogram. This may be done every 1-2 years. Talk to your health care provider about how often you should have regular mammograms. Talk with your health care provider about your test results, treatment options, and if necessary, the need for more tests. Vaccines  Your health care provider may recommend certain vaccines, such as: Influenza vaccine. This is recommended every year. Tetanus, diphtheria, and acellular pertussis (Tdap, Td) vaccine. You may need a Td booster every 10 years. Zoster vaccine. You may need this after  age 73. Pneumococcal 13-valent conjugate (PCV13) vaccine. One dose is recommended  after age 73. Pneumococcal polysaccharide (PPSV23) vaccine. One dose is recommended after age 73. Talk to your health care provider about which screenings and vaccines you need and how often you need them. This information is not intended to replace advice given to you by your health care provider. Make sure you discuss any questions you have with your health care provider. Document Released: 11/01/2015 Document Revised: 06/24/2016 Document Reviewed: 08/06/2015 Elsevier Interactive Patient Education  2017 [place] Prevention in the Home Falls can cause injuries. They can happen to people of all ages. There are many things you can do to make your home safe and to help prevent falls. What can I do on the outside of my home? Regularly fix the edges of walkways and driveways and fix any cracks. Remove anything that might make you trip as you walk through a door, such as a raised step or threshold. Trim any bushes or trees on the path to your home. Use bright outdoor lighting. Clear any walking paths of anything that might make someone trip, such as rocks or tools. Regularly check to see if handrails are loose or broken. Make sure that both sides of any steps have handrails. Any raised decks and porches should have guardrails on the edges. Have any leaves, snow, or ice cleared regularly. Use sand or salt on walking paths during winter. Clean up any spills in your garage right away. This includes oil or grease spills. What can I do in the bathroom? Use night lights. Install grab bars by the toilet and in the tub and shower. Do not use towel bars as grab bars. Use non-skid mats or decals in the tub or shower. If you need to sit down in the shower, use a plastic, non-slip stool. Keep the floor dry. Clean up any water that spills on the floor as soon as it happens. Remove soap buildup in the tub or shower regularly. Attach bath mats securely with double-sided non-slip rug tape. Do not  have throw rugs and other things on the floor that can make you trip. What can I do in the bedroom? Use night lights. Make sure that you have a light by your bed that is easy to reach. Do not use any sheets or blankets that are too big for your bed. They should not hang down onto the floor. Have a firm chair that has side arms. You can use this for support while you get dressed. Do not have throw rugs and other things on the floor that can make you trip. What can I do in the kitchen? Clean up any spills right away. Avoid walking on wet floors. Keep items that you use a lot in easy-to-reach places. If you need to reach something above you, use a strong step stool that has a grab bar. Keep electrical cords out of the way. Do not use floor polish or wax that makes floors slippery. If you must use wax, use non-skid floor wax. Do not have throw rugs and other things on the floor that can make you trip. What can I do with my stairs? Do not leave any items on the stairs. Make sure that there are handrails on both sides of the stairs and use them. Fix handrails that are broken or loose. Make sure that handrails are as long as the stairways. Check any carpeting to make sure that it is firmly attached to the stairs.  Fix any carpet that is loose or worn. Avoid having throw rugs at the top or bottom of the stairs. If you do have throw rugs, attach them to the floor with carpet tape. Make sure that you have a light switch at the top of the stairs and the bottom of the stairs. If you do not have them, ask someone to add them for you. What else can I do to help prevent falls? Wear shoes that: Do not have high heels. Have rubber bottoms. Are comfortable and fit you well. Are closed at the toe. Do not wear sandals. If you use a stepladder: Make sure that it is fully opened. Do not climb a closed stepladder. Make sure that both sides of the stepladder are locked into place. Ask someone to hold it for  you, if possible. Clearly mark and make sure that you can see: Any grab bars or handrails. First and last steps. Where the edge of each step is. Use tools that help you move around (mobility aids) if they are needed. These include: Canes. Walkers. Scooters. Crutches. Turn on the lights when you go into a dark area. Replace any light bulbs as soon as they burn out. Set up your furniture so you have a clear path. Avoid moving your furniture around. If any of your floors are uneven, fix them. If there are any pets around you, be aware of where they are. Review your medicines with your doctor. Some medicines can make you feel dizzy. This can increase your chance of falling. Ask your doctor what other things that you can do to help prevent falls. This information is not intended to replace advice given to you by your health care provider. Make sure you discuss any questions you have with your health care provider. Document Released: 08/01/2009 Document Revised: 03/12/2016 Document Reviewed: 11/09/2014 Elsevier Interactive Patient Education  2017 [employer].

## 2022-05-20 NOTE — Progress Notes (Signed)
Virtual Visit via Telephone Note  I connected with  [name] [name] on 05/20/22 at  2:45 PM EDT by telephone and verified that I am speaking with the correct person using two identifiers.  Medicare Annual Wellness visit completed telephonically due to Covid-19 pandemic.   Persons participating in this call: This Health Coach and this patient.   Location: Patient: home Provider: office   I discussed the limitations, risks, security and privacy concerns of performing an evaluation and management service by telephone and the availability of in person appointments. The patient expressed understanding and agreed to proceed.  Unable to perform video visit due to video visit attempted and failed and/or patient does not have video capability.   Some vital signs may be absent or patient reported.   Willette Brace, LPN   Subjective:   [name] [name] is a 71 y.o. female who presents for Medicare Annual (Subsequent) preventive examination.  Review of Systems     Cardiac Risk Factors include: advanced age (>55mn, >>32women);hypertension     Objective:    There were no vitals filed for this visit. There is no height or weight on file to calculate BMI.     05/20/2022    2:53 PM 02/04/2022    2:55 PM 01/20/2022    5:00 PM 01/20/2022    8:15 AM 01/03/2022    8:00 PM 05/14/2021    3:00 PM 01/13/2021    5:00 PM  Advanced Directives  Does Patient Have a Medical Advance Directive? Yes Yes  Yes No Yes No  Type of AIndustrial/product designerof AHolgateLiving will   Does patient want to make changes to medical advance directive?  No - Patient declined No - Patient declined No - Patient declined     Copy of HFort [place]in Chart? Yes - validated most recent copy scanned in chart (See row information)     No - copy requested   Would patient like information on creating a medical advance directive?  No - Patient  declined Yes (Inpatient - patient defers creating a medical advance directive and declines information at this time) No - Patient declined No - Patient declined  No - Patient declined    Current Medications (verified) Outpatient Encounter Medications as of 05/20/2022  Medication Sig   acetaminophen (TYLENOL) 325 MG tablet Take 650 mg by mouth every 6 (six) hours. scheduled   Biotin 10 MG CAPS Take 1 capsule by mouth 2 (two) times daily.   Black Cohosh 40 MG CAPS Take 40 mg by mouth 2 (two) times daily.   calcium carbonate (OSCAL) 1500 (600 Ca) MG TABS tablet Take 1,500 mg by mouth every morning.   Cholecalciferol (VITAMIN D3) 50 MCG (2000 UT) TABS Take 2,000-4,000 Units by mouth See admin instructions. Take one tablet (2000 units) by mouth every other day and take 2 tablets (4000 units) every other day   Coenzyme Q10 (CO Q-10) 100 MG CAPS Take 100 mg by mouth every morning.   Cranberry 500 MG CAPS Take 500 mg by mouth 2 (two) times daily with a meal.   Cyanocobalamin (VITAMIN B-12) 1000 MCG SUBL Place 1,000 mcg under the tongue every morning.   fluticasone (FLONASE) 50 MCG/ACT nasal spray Place 1 spray into both nostrils daily. (Patient taking differently: Place 1 spray into both nostrils every morning.)   glucosamine-chondroitin 500-400 MG tablet Take 1 tablet by mouth 2 (two) times  daily with a meal.   lisinopril (ZESTRIL) 5 MG tablet Take 1 tablet (5 mg total) by mouth daily. (Patient taking differently: Take 5 mg by mouth every morning.)   Lysine 500 MG CAPS Take 1,000 mg by mouth 2 (two) times daily.   Magnesium Citrate 100 MG TABS Take 200 mg by mouth every morning.   metoprolol tartrate (LOPRESSOR) 25 MG tablet TAKE 1 TAB (25 MG) PO TWICE DAILY - pt must make appt with provider for further refills - 1st attempt (Patient taking differently: Take 25 mg by mouth 2 (two) times daily.)   Nutritional Supplements (BOOST VHC) LIQD Take 120 mLs by mouth 3 (three) times daily with meals.   Omega-3  Fatty Acids (FISH OIL) 1200 MG CAPS Take 1,200 mg by mouth 2 (two) times daily with a meal.   progesterone (PROMETRIUM) 100 MG capsule Take 100 mg by mouth every morning.   thyroid (ARMOUR) 90 MG tablet Take 90 mg by mouth every morning.   Turmeric 500 MG CAPS Take 500 mg by mouth every evening.   vitamin A 10000 UNIT capsule Take 10,000 Units by mouth every evening.   vitamin E 400 UNIT capsule Take 400 Units by mouth every evening.   warfarin (COUMADIN) 5 MG tablet Take 1 tablet by mouth daily except 1.5 tablets on Sundays, Tuesdays, and Thursdays or as directed Anticoagulation Clinic. (Patient taking differently: Take 5-7.5 mg by mouth See admin instructions. Takes '5mg'$   (1 tablet) daily except 7.5 gm (1.5 tablets) on Tuesday, Thursday and Sunday)   [DISCONTINUED] polyethylene glycol (MIRALAX) 17 g packet Take 17 g by mouth daily.   [DISCONTINUED] ACIDOPHILUS LACTOBACILLUS PO Take 500 Million Cells by mouth every morning.   [DISCONTINUED] bisacodyl (DULCOLAX) 10 MG suppository Place 1 suppository (10 mg total) rectally as needed for moderate constipation (use if 2 days without adequate bowel movement or as needed).   [DISCONTINUED] clotrimazole (LOTRIMIN) 1 % cream Apply 1 application. topically 3 (three) times daily. Apply to buttocks   [DISCONTINUED] Menthol-Zinc Oxide (CALMOSEPTINE) 0.44-20.6 % OINT Apply 1 application. topically See admin instructions. Apply topically every shift - apply to buttocks/sacrum with each incontinent change. May keep at bedside.   No facility-administered encounter medications on file as of 05/20/2022.    Allergies (verified) Erythromycin, Atorvastatin, Lactose intolerance (gi), and Valsartan   History: Past Medical History:  Diagnosis Date   Anxiety    Arthritis    knees   Breast cancer ([place]) 1999   CHF (congestive heart failure) ([place])    Related to severe mitral regurgitation, April, 2013   COPD (chronic obstructive pulmonary disease) ([place])    COPD with  emphysema.. Assess by pulmonary team in the hospital April, 2013   Ejection fraction    EF 60%, echo, April, 2013, with severe MR before mitral valve replacement   Herpes    Hypothyroidism    IBS (irritable bowel syndrome)    Mitral valve regurgitation    Mitral valve replacement April, 2013, Mitral valve prolapse   Multiple sclerosis ([place])    Neurogenic bladder    Osteoporosis    Ovarian cyst    Paroxysmal atrial fibrillation (H[facility])    Rapid atrial fibrillation in-hospital, Rapid cardioversion,  before mitral valve surgery   Pulmonary hypertension ([place])    Echo, April, 2013, before mitral valve surgery   S/P Maze operation for atrial fibrillation 01/26/2012   Complete biatrial lesion set using cryothermy via right mini thoracotomy   S/P mitral valve replacement 01/26/2012   38m  Sorin [employer] Optiform mechanical prosthesis via right mini thoracotomy   Warfarin anticoagulation    Mechanical mitral prosthesis, April, 20136   Past Surgical History:  Procedure Laterality Date   BREAST LUMPECTOMY Right 1999   with sent.node, and axillary dissection (20)   CHEST TUBE INSERTION  01/26/2012   Procedure: CHEST TUBE INSERTION;  Surgeon: Rexene Alberts, MD;  Location: [place];  Service: Open Heart Surgery;  Laterality: Left;   COLONOSCOPY  2010   "normal"   [place]   urethral stricture repair   LEFT AND RIGHT HEART CATHETERIZATION WITH CORONARY ANGIOGRAM N/A 01/20/2012   Procedure: LEFT AND RIGHT HEART CATHETERIZATION WITH CORONARY ANGIOGRAM;  Surgeon: Burnell Blanks, MD;  Location: [facility] CATH LAB;  Service: Cardiovascular;  Laterality: N/A;   LYMPHADENECTOMY     MAZE  01/26/2012   Procedure: MAZE;  Surgeon: Rexene Alberts, MD;  Location: [place];  Service: Open Heart Surgery;  Laterality: N/A;   MITRAL VALVE REPLACEMENT  01/26/2012   Procedure: MINIMALLY INVASIVE MITRAL VALVE (MV) REPLACEMENT;  Surgeon: Rexene Alberts, MD;  Location: [place];   Service: Open Heart Surgery;  Laterality: Right;   ORIF TIBIA PLATEAU Right 01/05/2022   Procedure: OPEN REDUCTION INTERNAL FIXATION (ORIF) TIBIAL PLATEAU AND SHAFT;  Surgeon: Altamese Sugarcreek, MD;  Location: [place];  Service: Orthopedics;  Laterality: Right;   TEE WITHOUT CARDIOVERSION  01/19/2012   Procedure: TRANSESOPHAGEAL ECHOCARDIOGRAM (TEE);  Surgeon: Peter M Martinique, MD;  Location: [facility] ENDOSCOPY;  Service: Cardiovascular;  Laterality: N/A;   TONSILLECTOMY  1610   UMBILICAL HERNIA REPAIR  1952   WRIST SURGERY Right 2012   Family History  Problem Relation Age of Onset   Heart disease Father        cardiac arrest    CAD Father    Hypertension Father    CAD Mother        5 stents and numerous bypass surgery   Hypertension Mother    Hyperlipidemia Mother    CAD Other    Breast cancer Maternal Grandmother    Breast cancer Maternal Aunt    Social History   Socioeconomic History   Marital status: Widowed    Spouse name: Not on file   Number of children: 2   Years of education: 54   Highest education level: Not on file  Occupational History   Occupation: Retired  Tobacco Use   Smoking status: Never   Smokeless tobacco: Never  Vaping Use   Vaping Use: Never used  Substance and Sexual Activity   Alcohol use: No   Drug use: No   Sexual activity: Never    Birth control/protection: None  Other Topics Concern   Not on file  Social History Narrative   Patient is a widower (since 22), he currently lives with her son and daughter-in-law. She has 2 children.   She is college educated, and retired from the [employer].   She uses herbal remedies, and multiple over-the-counter supplements. She takes a daily vitamin.   She wears her seatbelt, exercises routinely, smoke detector in the home.   Requires a walker or wheelchair at times.   Feels safe in her relationships.   Social Determinants of Health   Financial Resource Strain: Low Risk  (05/20/2022)   Overall Financial Resource  Strain (CARDIA)    Difficulty of Paying Living Expenses: Not hard at all  Food Insecurity: No Food Insecurity (05/20/2022)   Hunger  Vital Sign    Worried About Charity fundraiser in the Last Year: Never true    Ran Out of Food in the Last Year: Never true  Transportation Needs: No Transportation Needs (05/20/2022)   PRAPARE - Hydrologist (Medical): No    Lack of Transportation (Non-Medical): No  Physical Activity: Insufficiently Active (05/20/2022)   Exercise Vital Sign    Days of Exercise per Week: 2 days    Minutes of Exercise per Session: 60 min  Stress: No Stress Concern Present (05/20/2022)   [place]    Feeling of Stress : Not at all  Social Connections: Socially Isolated (05/20/2022)   Social Connection and Isolation Panel [NHANES]    Frequency of Communication with Friends and Family: More than three times a week    Frequency of Social Gatherings with Friends and Family: Never    Attends Religious Services: Never    Marine scientist or Organizations: No    Attends Archivist Meetings: Never    Marital Status: Widowed    Tobacco Counseling Counseling given: Not Answered   Clinical Intake:  Pre-visit preparation completed: Yes  Pain : No/denies pain     Nutritional Risks: None Diabetes: No  How often do you need to have someone help you when you read instructions, pamphlets, or other written materials from your doctor or pharmacy?: 1 - Never  Diabetic?no  Interpreter Needed?: No  Information entered by :: Charlott Rakes, LPN   Activities of Daily Living    05/20/2022    2:54 PM 01/20/2022    1:12 PM  In your present state of health, do you have any difficulty performing the following activities:  Hearing? 0   Vision? 0   Difficulty concentrating or making decisions? 0   Walking or climbing stairs? 0   Dressing or bathing? 0   Doing errands, shopping?  0 0  Comment  patient is driving  Preparing Food and eating ? N   Using the Toilet? N   In the past six months, have you accidently leaked urine? Y   Comment wears pull and pads   Do you have problems with loss of bowel control? N   Managing your Medications? N   Managing your Finances? N   Housekeeping or managing your Housekeeping? N     Patient Care Team: Ma Hillock, DO as PCP - General (Family Medicine) Elsie Stain, MD as Attending Physician (Pulmonary Disease) Regal, Tamala Fothergill, DPM as Consulting Physician (Podiatry) Dorothy Spark, MD as Consulting Physician (Cardiology) Mingo Amber, MD (Alternative Medicine) Ditty, Kevan Ny, MD as Consulting Physician (Neurosurgery) Princess Bruins, MD as Consulting Physician (Obstetrics and Gynecology) Vinnie Level (Dentistry) Druscilla Brownie, MD as Consulting Physician (Dermatology) Pieter Partridge, DO as Consulting Physician (Neurology) Calvert Cantor, MD as Consulting Physician (Ophthalmology)  Indicate any recent Medical Services you may have received from other than [facility] providers in the past year (date may be approximate).     Assessment:   This is a routine wellness examination for [facility].  Hearing/Vision screen Hearing Screening - Comments:: Pt denies any hearing issues  Vision Screening - Comments:: Pt follows up with [facility]   Dietary issues and exercise activities discussed: Current Exercise Habits: Structured exercise class, Type of exercise: Other - see comments, Time (Minutes): 60 (PT), Frequency (Times/Week): 2, Weekly Exercise (Minutes/Week): 120   Goals Addressed  This Visit's Progress    Patient Stated       None at this time        Depression Screen    05/20/2022    2:52 PM 02/04/2022    1:28 PM 05/14/2021    3:04 PM 02/06/2021    2:46 PM 12/18/2019    4:16 PM 03/28/2019    1:23 PM 03/15/2018    2:53 PM  PHQ 2/9 Scores  PHQ - 2 Score 0 0 0 0 0 0 0     Fall Risk    05/20/2022    2:54 PM 05/14/2021    3:06 PM 02/06/2021    2:46 PM 12/19/2020    2:25 PM 12/18/2019    4:16 PM  Fall Risk   Falls in the past year? 1 0 0 0 0  Number falls in past yr: 1 0 0 0 0  Injury with Fall? 1 0 0 0 0  Comment completed rehab      Risk for fall due to : Impaired vision;Impaired mobility Impaired balance/gait     Follow up Falls prevention discussed Falls evaluation completed;Falls prevention discussed       FALL RISK PREVENTION PERTAINING TO THE HOME:  Any stairs in or around the home? Yes  If so, are there any without handrails? No  Home free of loose throw rugs in walkways, pet beds, electrical cords, etc? Yes  Adequate lighting in your home to reduce risk of falls? Yes   ASSISTIVE DEVICES UTILIZED TO PREVENT FALLS:  Life alert? Yes  Use of a cane, walker or w/c? Yes  Grab bars in the bathroom? Yes  Shower chair or bench in shower? Yes  Elevated toilet seat or a handicapped toilet? Yes   TIMED UP AND GO:  Was the test performed? No .   Cognitive Function:        05/20/2022    2:56 PM  6CIT Screen  What Year? 0 points  What month? 0 points  What time? 0 points  Count back from 20 0 points  Months in reverse 0 points  Repeat phrase 0 points  Total Score 0 points    Immunizations Immunization History  Administered Date(s) Administered   Td 10/21/2003   Tdap 02/17/2011    TDAP status: Due, Education has been provided regarding the importance of this vaccine. Advised may receive this vaccine at local pharmacy or Health Dept. Aware to provide a copy of the vaccination record if obtained from local pharmacy or Health Dept. Verbalized acceptance and understanding.  Flu Vaccine status: Declined, Education has been provided regarding the importance of this vaccine but patient still declined. Advised may receive this vaccine at local pharmacy or Health Dept. Aware to provide a copy of the vaccination record if obtained from local pharmacy  or Health Dept. Verbalized acceptance and understanding.  Pneumococcal vaccine status: Declined,  Education has been provided regarding the importance of this vaccine but patient still declined. Advised may receive this vaccine at local pharmacy or Health Dept. Aware to provide a copy of the vaccination record if obtained from local pharmacy or Health Dept. Verbalized acceptance and understanding.   Covid-19 vaccine status: Declined, Education has been provided regarding the importance of this vaccine but patient still declined. Advised may receive this vaccine at local pharmacy or Health Dept.or vaccine clinic. Aware to provide a copy of the vaccination record if obtained from local pharmacy or Health Dept. Verbalized acceptance and understanding.  Qualifies for Shingles Vaccine? Yes  Zostavax completed No   Shingrix Completed?: No.    Education has been provided regarding the importance of this vaccine. Patient has been advised to call insurance company to determine out of pocket expense if they have not yet received this vaccine. Advised may also receive vaccine at local pharmacy or Health Dept. Verbalized acceptance and understanding.  Screening Tests Health Maintenance  Topic Date Due   Fecal DNA (Cologuard)  10/23/2022   MAMMOGRAM  08/08/2023   DEXA SCAN  Completed   Hepatitis C Screening  Completed   HPV VACCINES  Aged Out   Pneumonia Vaccine 36+ Years old  Discontinued   INFLUENZA VACCINE  Discontinued   TETANUS/TDAP  Discontinued   COVID-19 Vaccine  Discontinued   Zoster Vaccines- Shingrix  Discontinued    Health Maintenance  There are no preventive care reminders to display for this patient.  Colorectal cancer screening: Type of screening: Cologuard. Completed 10/24/19. Repeat every 3 years  Mammogram status: Completed 08/07/21. Repeat every year  Bone Density status: Completed 08/07/21. Results reflect: Bone density results: OSTEOPOROSIS. Repeat every 2  years.    Additional Screening:  Hepatitis C Screening:  Completed 10/19/13  Vision Screening: Recommended annual ophthalmology exams for early detection of glaucoma and other disorders of the eye. Is the patient up to date with their annual eye exam?  Yes  Who is the provider or what is the name of the office in which the patient attends annual eye exams?  [facility]  If pt is not established with a provider, would they like to be referred to a provider to establish care? No .   Dental Screening: Recommended annual dental exams for proper oral hygiene  Community Resource Referral / Chronic Care Management: CRR required this visit?  No   CCM required this visit?  No      Plan:     I have personally reviewed and noted the following in the patient's chart:   Medical and social history Use of alcohol, tobacco or illicit drugs  Current medications and supplements including opioid prescriptions.  Functional ability and status Nutritional status Physical activity Advanced directives List of other physicians Hospitalizations, surgeries, and ER visits in previous 12 months Vitals Screenings to include cognitive, depression, and falls Referrals and appointments  In addition, I have reviewed and discussed with patient certain preventive protocols, quality metrics, and best practice recommendations. A written personalized care plan for preventive services as well as general preventive health recommendations were provided to patient.     Willette Brace, LPN   12/17/5398   Nurse Notes: None

## 2022-05-21 DIAGNOSIS — I110 Hypertensive heart disease with heart failure: Secondary | ICD-10-CM | POA: Diagnosis not present

## 2022-05-21 DIAGNOSIS — I480 Paroxysmal atrial fibrillation: Secondary | ICD-10-CM | POA: Diagnosis not present

## 2022-05-21 DIAGNOSIS — M810 Age-related osteoporosis without current pathological fracture: Secondary | ICD-10-CM | POA: Diagnosis not present

## 2022-05-21 DIAGNOSIS — S82154D Nondisplaced fracture of right tibial tuberosity, subsequent encounter for closed fracture with routine healing: Secondary | ICD-10-CM | POA: Diagnosis not present

## 2022-05-21 DIAGNOSIS — I5032 Chronic diastolic (congestive) heart failure: Secondary | ICD-10-CM | POA: Diagnosis not present

## 2022-05-21 DIAGNOSIS — E039 Hypothyroidism, unspecified: Secondary | ICD-10-CM | POA: Diagnosis not present

## 2022-05-26 DIAGNOSIS — M810 Age-related osteoporosis without current pathological fracture: Secondary | ICD-10-CM | POA: Diagnosis not present

## 2022-05-26 DIAGNOSIS — I480 Paroxysmal atrial fibrillation: Secondary | ICD-10-CM | POA: Diagnosis not present

## 2022-05-26 DIAGNOSIS — I110 Hypertensive heart disease with heart failure: Secondary | ICD-10-CM | POA: Diagnosis not present

## 2022-05-26 DIAGNOSIS — I5032 Chronic diastolic (congestive) heart failure: Secondary | ICD-10-CM | POA: Diagnosis not present

## 2022-05-26 DIAGNOSIS — E039 Hypothyroidism, unspecified: Secondary | ICD-10-CM | POA: Diagnosis not present

## 2022-05-26 DIAGNOSIS — S82154D Nondisplaced fracture of right tibial tuberosity, subsequent encounter for closed fracture with routine healing: Secondary | ICD-10-CM | POA: Diagnosis not present

## 2022-05-27 ENCOUNTER — Ambulatory Visit (INDEPENDENT_AMBULATORY_CARE_PROVIDER_SITE_OTHER): Payer: Medicare Other | Admitting: *Deleted

## 2022-05-27 DIAGNOSIS — I5032 Chronic diastolic (congestive) heart failure: Secondary | ICD-10-CM | POA: Diagnosis not present

## 2022-05-27 DIAGNOSIS — Z5181 Encounter for therapeutic drug level monitoring: Secondary | ICD-10-CM

## 2022-05-27 DIAGNOSIS — Z952 Presence of prosthetic heart valve: Secondary | ICD-10-CM

## 2022-05-27 DIAGNOSIS — S82154D Nondisplaced fracture of right tibial tuberosity, subsequent encounter for closed fracture with routine healing: Secondary | ICD-10-CM | POA: Diagnosis not present

## 2022-05-27 DIAGNOSIS — Z8679 Personal history of other diseases of the circulatory system: Secondary | ICD-10-CM

## 2022-05-27 DIAGNOSIS — S82111D Displaced fracture of right tibial spine, subsequent encounter for closed fracture with routine healing: Secondary | ICD-10-CM | POA: Diagnosis not present

## 2022-05-27 DIAGNOSIS — E039 Hypothyroidism, unspecified: Secondary | ICD-10-CM | POA: Diagnosis not present

## 2022-05-27 DIAGNOSIS — S82141D Displaced bicondylar fracture of right tibia, subsequent encounter for closed fracture with routine healing: Secondary | ICD-10-CM | POA: Diagnosis not present

## 2022-05-27 DIAGNOSIS — M810 Age-related osteoporosis without current pathological fracture: Secondary | ICD-10-CM | POA: Diagnosis not present

## 2022-05-27 DIAGNOSIS — Z9889 Other specified postprocedural states: Secondary | ICD-10-CM

## 2022-05-27 DIAGNOSIS — S8264XD Nondisplaced fracture of lateral malleolus of right fibula, subsequent encounter for closed fracture with routine healing: Secondary | ICD-10-CM | POA: Diagnosis not present

## 2022-05-27 DIAGNOSIS — I110 Hypertensive heart disease with heart failure: Secondary | ICD-10-CM | POA: Diagnosis not present

## 2022-05-27 DIAGNOSIS — I480 Paroxysmal atrial fibrillation: Secondary | ICD-10-CM | POA: Diagnosis not present

## 2022-05-27 LAB — POCT INR: INR: 3.5 — AB (ref 2.0–3.0)

## 2022-05-28 DIAGNOSIS — M810 Age-related osteoporosis without current pathological fracture: Secondary | ICD-10-CM | POA: Diagnosis not present

## 2022-05-28 DIAGNOSIS — I110 Hypertensive heart disease with heart failure: Secondary | ICD-10-CM | POA: Diagnosis not present

## 2022-05-28 DIAGNOSIS — I480 Paroxysmal atrial fibrillation: Secondary | ICD-10-CM | POA: Diagnosis not present

## 2022-05-28 DIAGNOSIS — I5032 Chronic diastolic (congestive) heart failure: Secondary | ICD-10-CM | POA: Diagnosis not present

## 2022-05-28 DIAGNOSIS — S82154D Nondisplaced fracture of right tibial tuberosity, subsequent encounter for closed fracture with routine healing: Secondary | ICD-10-CM | POA: Diagnosis not present

## 2022-05-28 DIAGNOSIS — E039 Hypothyroidism, unspecified: Secondary | ICD-10-CM | POA: Diagnosis not present

## 2022-06-02 DIAGNOSIS — S82154D Nondisplaced fracture of right tibial tuberosity, subsequent encounter for closed fracture with routine healing: Secondary | ICD-10-CM | POA: Diagnosis not present

## 2022-06-02 DIAGNOSIS — I480 Paroxysmal atrial fibrillation: Secondary | ICD-10-CM | POA: Diagnosis not present

## 2022-06-02 DIAGNOSIS — E039 Hypothyroidism, unspecified: Secondary | ICD-10-CM | POA: Diagnosis not present

## 2022-06-02 DIAGNOSIS — M810 Age-related osteoporosis without current pathological fracture: Secondary | ICD-10-CM | POA: Diagnosis not present

## 2022-06-02 DIAGNOSIS — I5032 Chronic diastolic (congestive) heart failure: Secondary | ICD-10-CM | POA: Diagnosis not present

## 2022-06-02 DIAGNOSIS — I110 Hypertensive heart disease with heart failure: Secondary | ICD-10-CM | POA: Diagnosis not present

## 2022-06-03 ENCOUNTER — Ambulatory Visit (INDEPENDENT_AMBULATORY_CARE_PROVIDER_SITE_OTHER): Payer: Medicare Other

## 2022-06-03 DIAGNOSIS — I5032 Chronic diastolic (congestive) heart failure: Secondary | ICD-10-CM | POA: Diagnosis not present

## 2022-06-03 DIAGNOSIS — Z5181 Encounter for therapeutic drug level monitoring: Secondary | ICD-10-CM

## 2022-06-03 DIAGNOSIS — I480 Paroxysmal atrial fibrillation: Secondary | ICD-10-CM | POA: Diagnosis not present

## 2022-06-03 DIAGNOSIS — Z9889 Other specified postprocedural states: Secondary | ICD-10-CM | POA: Diagnosis not present

## 2022-06-03 DIAGNOSIS — Z8679 Personal history of other diseases of the circulatory system: Secondary | ICD-10-CM

## 2022-06-03 DIAGNOSIS — Z952 Presence of prosthetic heart valve: Secondary | ICD-10-CM | POA: Diagnosis not present

## 2022-06-03 DIAGNOSIS — I110 Hypertensive heart disease with heart failure: Secondary | ICD-10-CM | POA: Diagnosis not present

## 2022-06-03 DIAGNOSIS — E039 Hypothyroidism, unspecified: Secondary | ICD-10-CM | POA: Diagnosis not present

## 2022-06-03 DIAGNOSIS — M810 Age-related osteoporosis without current pathological fracture: Secondary | ICD-10-CM | POA: Diagnosis not present

## 2022-06-03 DIAGNOSIS — S82154D Nondisplaced fracture of right tibial tuberosity, subsequent encounter for closed fracture with routine healing: Secondary | ICD-10-CM | POA: Diagnosis not present

## 2022-06-03 LAB — POCT INR: INR: 3.4 — AB (ref 2.0–3.0)

## 2022-06-03 NOTE — Patient Instructions (Signed)
Description   Spoke with Caryl Pina, RN at [facility] while in pt's home, advised to continue taking 1 tablet daily. Have a leafy veggie today and remain consistent. Recheck INR in 2 weeks. Coumadin Clinic [phone].

## 2022-06-05 DIAGNOSIS — S82154D Nondisplaced fracture of right tibial tuberosity, subsequent encounter for closed fracture with routine healing: Secondary | ICD-10-CM | POA: Diagnosis not present

## 2022-06-05 DIAGNOSIS — I480 Paroxysmal atrial fibrillation: Secondary | ICD-10-CM | POA: Diagnosis not present

## 2022-06-05 DIAGNOSIS — E039 Hypothyroidism, unspecified: Secondary | ICD-10-CM | POA: Diagnosis not present

## 2022-06-05 DIAGNOSIS — I5032 Chronic diastolic (congestive) heart failure: Secondary | ICD-10-CM | POA: Diagnosis not present

## 2022-06-05 DIAGNOSIS — I110 Hypertensive heart disease with heart failure: Secondary | ICD-10-CM | POA: Diagnosis not present

## 2022-06-05 DIAGNOSIS — M810 Age-related osteoporosis without current pathological fracture: Secondary | ICD-10-CM | POA: Diagnosis not present

## 2022-06-09 DIAGNOSIS — E039 Hypothyroidism, unspecified: Secondary | ICD-10-CM | POA: Diagnosis not present

## 2022-06-09 DIAGNOSIS — I5032 Chronic diastolic (congestive) heart failure: Secondary | ICD-10-CM | POA: Diagnosis not present

## 2022-06-09 DIAGNOSIS — S82154D Nondisplaced fracture of right tibial tuberosity, subsequent encounter for closed fracture with routine healing: Secondary | ICD-10-CM | POA: Diagnosis not present

## 2022-06-09 DIAGNOSIS — I110 Hypertensive heart disease with heart failure: Secondary | ICD-10-CM | POA: Diagnosis not present

## 2022-06-09 DIAGNOSIS — M810 Age-related osteoporosis without current pathological fracture: Secondary | ICD-10-CM | POA: Diagnosis not present

## 2022-06-09 DIAGNOSIS — I480 Paroxysmal atrial fibrillation: Secondary | ICD-10-CM | POA: Diagnosis not present

## 2022-06-11 DIAGNOSIS — I5032 Chronic diastolic (congestive) heart failure: Secondary | ICD-10-CM | POA: Diagnosis not present

## 2022-06-11 DIAGNOSIS — S82154D Nondisplaced fracture of right tibial tuberosity, subsequent encounter for closed fracture with routine healing: Secondary | ICD-10-CM | POA: Diagnosis not present

## 2022-06-11 DIAGNOSIS — I110 Hypertensive heart disease with heart failure: Secondary | ICD-10-CM | POA: Diagnosis not present

## 2022-06-11 DIAGNOSIS — I480 Paroxysmal atrial fibrillation: Secondary | ICD-10-CM | POA: Diagnosis not present

## 2022-06-11 DIAGNOSIS — E039 Hypothyroidism, unspecified: Secondary | ICD-10-CM | POA: Diagnosis not present

## 2022-06-11 DIAGNOSIS — M810 Age-related osteoporosis without current pathological fracture: Secondary | ICD-10-CM | POA: Diagnosis not present

## 2022-06-13 DIAGNOSIS — Z853 Personal history of malignant neoplasm of breast: Secondary | ICD-10-CM | POA: Diagnosis not present

## 2022-06-13 DIAGNOSIS — I480 Paroxysmal atrial fibrillation: Secondary | ICD-10-CM | POA: Diagnosis not present

## 2022-06-13 DIAGNOSIS — N319 Neuromuscular dysfunction of bladder, unspecified: Secondary | ICD-10-CM | POA: Diagnosis not present

## 2022-06-13 DIAGNOSIS — Z7901 Long term (current) use of anticoagulants: Secondary | ICD-10-CM | POA: Diagnosis not present

## 2022-06-13 DIAGNOSIS — B029 Zoster without complications: Secondary | ICD-10-CM | POA: Diagnosis not present

## 2022-06-13 DIAGNOSIS — M170 Bilateral primary osteoarthritis of knee: Secondary | ICD-10-CM | POA: Diagnosis not present

## 2022-06-13 DIAGNOSIS — K589 Irritable bowel syndrome without diarrhea: Secondary | ICD-10-CM | POA: Diagnosis not present

## 2022-06-13 DIAGNOSIS — G35 Multiple sclerosis: Secondary | ICD-10-CM | POA: Diagnosis not present

## 2022-06-13 DIAGNOSIS — I110 Hypertensive heart disease with heart failure: Secondary | ICD-10-CM | POA: Diagnosis not present

## 2022-06-13 DIAGNOSIS — Z9181 History of falling: Secondary | ICD-10-CM | POA: Diagnosis not present

## 2022-06-13 DIAGNOSIS — E039 Hypothyroidism, unspecified: Secondary | ICD-10-CM | POA: Diagnosis not present

## 2022-06-13 DIAGNOSIS — Z87440 Personal history of urinary (tract) infections: Secondary | ICD-10-CM | POA: Diagnosis not present

## 2022-06-13 DIAGNOSIS — N39498 Other specified urinary incontinence: Secondary | ICD-10-CM | POA: Diagnosis not present

## 2022-06-13 DIAGNOSIS — I272 Pulmonary hypertension, unspecified: Secondary | ICD-10-CM | POA: Diagnosis not present

## 2022-06-13 DIAGNOSIS — Z8673 Personal history of transient ischemic attack (TIA), and cerebral infarction without residual deficits: Secondary | ICD-10-CM | POA: Diagnosis not present

## 2022-06-13 DIAGNOSIS — M80061D Age-related osteoporosis with current pathological fracture, right lower leg, subsequent encounter for fracture with routine healing: Secondary | ICD-10-CM | POA: Diagnosis not present

## 2022-06-13 DIAGNOSIS — R159 Full incontinence of feces: Secondary | ICD-10-CM | POA: Diagnosis not present

## 2022-06-13 DIAGNOSIS — I5032 Chronic diastolic (congestive) heart failure: Secondary | ICD-10-CM | POA: Diagnosis not present

## 2022-06-13 DIAGNOSIS — K633 Ulcer of intestine: Secondary | ICD-10-CM | POA: Diagnosis not present

## 2022-06-13 DIAGNOSIS — F419 Anxiety disorder, unspecified: Secondary | ICD-10-CM | POA: Diagnosis not present

## 2022-06-13 DIAGNOSIS — J449 Chronic obstructive pulmonary disease, unspecified: Secondary | ICD-10-CM | POA: Diagnosis not present

## 2022-06-13 DIAGNOSIS — D649 Anemia, unspecified: Secondary | ICD-10-CM | POA: Diagnosis not present

## 2022-06-15 ENCOUNTER — Ambulatory Visit: Payer: Medicare Other | Admitting: Physician Assistant

## 2022-06-15 DIAGNOSIS — M80061D Age-related osteoporosis with current pathological fracture, right lower leg, subsequent encounter for fracture with routine healing: Secondary | ICD-10-CM | POA: Diagnosis not present

## 2022-06-15 DIAGNOSIS — K633 Ulcer of intestine: Secondary | ICD-10-CM | POA: Diagnosis not present

## 2022-06-15 DIAGNOSIS — I480 Paroxysmal atrial fibrillation: Secondary | ICD-10-CM | POA: Diagnosis not present

## 2022-06-15 DIAGNOSIS — I110 Hypertensive heart disease with heart failure: Secondary | ICD-10-CM | POA: Diagnosis not present

## 2022-06-15 DIAGNOSIS — I5032 Chronic diastolic (congestive) heart failure: Secondary | ICD-10-CM | POA: Diagnosis not present

## 2022-06-15 DIAGNOSIS — E039 Hypothyroidism, unspecified: Secondary | ICD-10-CM | POA: Diagnosis not present

## 2022-06-16 DIAGNOSIS — I110 Hypertensive heart disease with heart failure: Secondary | ICD-10-CM | POA: Diagnosis not present

## 2022-06-16 DIAGNOSIS — K633 Ulcer of intestine: Secondary | ICD-10-CM | POA: Diagnosis not present

## 2022-06-16 DIAGNOSIS — I480 Paroxysmal atrial fibrillation: Secondary | ICD-10-CM | POA: Diagnosis not present

## 2022-06-16 DIAGNOSIS — M80061D Age-related osteoporosis with current pathological fracture, right lower leg, subsequent encounter for fracture with routine healing: Secondary | ICD-10-CM | POA: Diagnosis not present

## 2022-06-16 DIAGNOSIS — E039 Hypothyroidism, unspecified: Secondary | ICD-10-CM | POA: Diagnosis not present

## 2022-06-16 DIAGNOSIS — I5032 Chronic diastolic (congestive) heart failure: Secondary | ICD-10-CM | POA: Diagnosis not present

## 2022-06-17 ENCOUNTER — Telehealth: Payer: Self-pay | Admitting: *Deleted

## 2022-06-17 DIAGNOSIS — I480 Paroxysmal atrial fibrillation: Secondary | ICD-10-CM | POA: Diagnosis not present

## 2022-06-17 DIAGNOSIS — M80061D Age-related osteoporosis with current pathological fracture, right lower leg, subsequent encounter for fracture with routine healing: Secondary | ICD-10-CM | POA: Diagnosis not present

## 2022-06-17 DIAGNOSIS — I110 Hypertensive heart disease with heart failure: Secondary | ICD-10-CM | POA: Diagnosis not present

## 2022-06-17 DIAGNOSIS — E039 Hypothyroidism, unspecified: Secondary | ICD-10-CM | POA: Diagnosis not present

## 2022-06-17 DIAGNOSIS — K633 Ulcer of intestine: Secondary | ICD-10-CM | POA: Diagnosis not present

## 2022-06-17 DIAGNOSIS — I5032 Chronic diastolic (congestive) heart failure: Secondary | ICD-10-CM | POA: Diagnosis not present

## 2022-06-17 NOTE — Telephone Encounter (Signed)
Received a voicemail from [place] with [facility] and she stated she was unable to get the pt's INR level and they would go back out tomorrow. Called her back and inquired about the message  and she stated the machine would not move past the code number and that she had stuck the pt 2 times and was going to get another nurse to do it tomorrow.  Advised her on what the 507 is stuck on her POC machine because the is the verification for bottle of strips and she has to press the M button to have it start counting down. She stated no none has showed her that and now she got it and was thankful. Pt declined to have another finger stick so she will come back tomorrow,

## 2022-06-18 ENCOUNTER — Ambulatory Visit (INDEPENDENT_AMBULATORY_CARE_PROVIDER_SITE_OTHER): Payer: Medicare Other

## 2022-06-18 ENCOUNTER — Telehealth: Payer: Self-pay | Admitting: Cardiology

## 2022-06-18 DIAGNOSIS — E039 Hypothyroidism, unspecified: Secondary | ICD-10-CM | POA: Diagnosis not present

## 2022-06-18 DIAGNOSIS — Z8679 Personal history of other diseases of the circulatory system: Secondary | ICD-10-CM

## 2022-06-18 DIAGNOSIS — I480 Paroxysmal atrial fibrillation: Secondary | ICD-10-CM | POA: Diagnosis not present

## 2022-06-18 DIAGNOSIS — Z5181 Encounter for therapeutic drug level monitoring: Secondary | ICD-10-CM

## 2022-06-18 DIAGNOSIS — Z952 Presence of prosthetic heart valve: Secondary | ICD-10-CM | POA: Diagnosis not present

## 2022-06-18 DIAGNOSIS — K633 Ulcer of intestine: Secondary | ICD-10-CM | POA: Diagnosis not present

## 2022-06-18 DIAGNOSIS — Z9889 Other specified postprocedural states: Secondary | ICD-10-CM

## 2022-06-18 DIAGNOSIS — I5032 Chronic diastolic (congestive) heart failure: Secondary | ICD-10-CM | POA: Diagnosis not present

## 2022-06-18 DIAGNOSIS — I110 Hypertensive heart disease with heart failure: Secondary | ICD-10-CM | POA: Diagnosis not present

## 2022-06-18 DIAGNOSIS — M80061D Age-related osteoporosis with current pathological fracture, right lower leg, subsequent encounter for fracture with routine healing: Secondary | ICD-10-CM | POA: Diagnosis not present

## 2022-06-18 LAB — POCT INR: INR: 2.4 (ref 2.0–3.0)

## 2022-06-18 NOTE — Patient Instructions (Signed)
Description   Spoke with Mickel Baas, RN at [facility] and pt, advised to take 1.5 tablets today and then continue taking 1 tablet daily. Remain consistent with greens.  Recheck INR in 2 weeks. Coumadin Clinic [phone].

## 2022-06-18 NOTE — Telephone Encounter (Signed)
Orders received and signed by Dr. Johney Frame.  Will fax forms to the contact information provided on cover sheet from [facility].

## 2022-06-18 NOTE — Telephone Encounter (Signed)
Confirmation fax received. 

## 2022-06-18 NOTE — Telephone Encounter (Signed)
  [facility] is calling to let Dr. Johney Frame know she is going to fax an order for pt's PT INR order

## 2022-06-18 NOTE — Telephone Encounter (Signed)
Will await fax and have Dr. Johney Frame sign this once received. Will fax back to [facility] thereafter.

## 2022-06-23 DIAGNOSIS — K633 Ulcer of intestine: Secondary | ICD-10-CM | POA: Diagnosis not present

## 2022-06-23 DIAGNOSIS — E039 Hypothyroidism, unspecified: Secondary | ICD-10-CM | POA: Diagnosis not present

## 2022-06-23 DIAGNOSIS — I480 Paroxysmal atrial fibrillation: Secondary | ICD-10-CM | POA: Diagnosis not present

## 2022-06-23 DIAGNOSIS — I110 Hypertensive heart disease with heart failure: Secondary | ICD-10-CM | POA: Diagnosis not present

## 2022-06-23 DIAGNOSIS — M80061D Age-related osteoporosis with current pathological fracture, right lower leg, subsequent encounter for fracture with routine healing: Secondary | ICD-10-CM | POA: Diagnosis not present

## 2022-06-23 DIAGNOSIS — I5032 Chronic diastolic (congestive) heart failure: Secondary | ICD-10-CM | POA: Diagnosis not present

## 2022-06-25 DIAGNOSIS — K633 Ulcer of intestine: Secondary | ICD-10-CM | POA: Diagnosis not present

## 2022-06-25 DIAGNOSIS — E039 Hypothyroidism, unspecified: Secondary | ICD-10-CM | POA: Diagnosis not present

## 2022-06-25 DIAGNOSIS — I480 Paroxysmal atrial fibrillation: Secondary | ICD-10-CM | POA: Diagnosis not present

## 2022-06-25 DIAGNOSIS — I5032 Chronic diastolic (congestive) heart failure: Secondary | ICD-10-CM | POA: Diagnosis not present

## 2022-06-25 DIAGNOSIS — M80061D Age-related osteoporosis with current pathological fracture, right lower leg, subsequent encounter for fracture with routine healing: Secondary | ICD-10-CM | POA: Diagnosis not present

## 2022-06-25 DIAGNOSIS — I110 Hypertensive heart disease with heart failure: Secondary | ICD-10-CM | POA: Diagnosis not present

## 2022-06-29 DIAGNOSIS — E039 Hypothyroidism, unspecified: Secondary | ICD-10-CM | POA: Diagnosis not present

## 2022-06-29 DIAGNOSIS — I110 Hypertensive heart disease with heart failure: Secondary | ICD-10-CM | POA: Diagnosis not present

## 2022-06-29 DIAGNOSIS — M80061D Age-related osteoporosis with current pathological fracture, right lower leg, subsequent encounter for fracture with routine healing: Secondary | ICD-10-CM | POA: Diagnosis not present

## 2022-06-29 DIAGNOSIS — I480 Paroxysmal atrial fibrillation: Secondary | ICD-10-CM | POA: Diagnosis not present

## 2022-06-29 DIAGNOSIS — K633 Ulcer of intestine: Secondary | ICD-10-CM | POA: Diagnosis not present

## 2022-06-29 DIAGNOSIS — I5032 Chronic diastolic (congestive) heart failure: Secondary | ICD-10-CM | POA: Diagnosis not present

## 2022-07-02 ENCOUNTER — Ambulatory Visit (INDEPENDENT_AMBULATORY_CARE_PROVIDER_SITE_OTHER): Payer: Medicare Other | Admitting: Cardiology

## 2022-07-02 DIAGNOSIS — I110 Hypertensive heart disease with heart failure: Secondary | ICD-10-CM | POA: Diagnosis not present

## 2022-07-02 DIAGNOSIS — K633 Ulcer of intestine: Secondary | ICD-10-CM | POA: Diagnosis not present

## 2022-07-02 DIAGNOSIS — Z5181 Encounter for therapeutic drug level monitoring: Secondary | ICD-10-CM

## 2022-07-02 DIAGNOSIS — Z952 Presence of prosthetic heart valve: Secondary | ICD-10-CM

## 2022-07-02 DIAGNOSIS — I5032 Chronic diastolic (congestive) heart failure: Secondary | ICD-10-CM | POA: Diagnosis not present

## 2022-07-02 DIAGNOSIS — Z8679 Personal history of other diseases of the circulatory system: Secondary | ICD-10-CM

## 2022-07-02 DIAGNOSIS — M80061D Age-related osteoporosis with current pathological fracture, right lower leg, subsequent encounter for fracture with routine healing: Secondary | ICD-10-CM | POA: Diagnosis not present

## 2022-07-02 DIAGNOSIS — I480 Paroxysmal atrial fibrillation: Secondary | ICD-10-CM | POA: Diagnosis not present

## 2022-07-02 DIAGNOSIS — E039 Hypothyroidism, unspecified: Secondary | ICD-10-CM | POA: Diagnosis not present

## 2022-07-02 LAB — POCT INR: INR: 1.3 — AB (ref 2.0–3.0)

## 2022-07-03 DIAGNOSIS — M80061D Age-related osteoporosis with current pathological fracture, right lower leg, subsequent encounter for fracture with routine healing: Secondary | ICD-10-CM | POA: Diagnosis not present

## 2022-07-03 DIAGNOSIS — I110 Hypertensive heart disease with heart failure: Secondary | ICD-10-CM | POA: Diagnosis not present

## 2022-07-03 DIAGNOSIS — I480 Paroxysmal atrial fibrillation: Secondary | ICD-10-CM | POA: Diagnosis not present

## 2022-07-03 DIAGNOSIS — I5032 Chronic diastolic (congestive) heart failure: Secondary | ICD-10-CM | POA: Diagnosis not present

## 2022-07-03 DIAGNOSIS — E039 Hypothyroidism, unspecified: Secondary | ICD-10-CM | POA: Diagnosis not present

## 2022-07-03 DIAGNOSIS — K633 Ulcer of intestine: Secondary | ICD-10-CM | POA: Diagnosis not present

## 2022-07-07 DIAGNOSIS — E039 Hypothyroidism, unspecified: Secondary | ICD-10-CM | POA: Diagnosis not present

## 2022-07-07 DIAGNOSIS — K633 Ulcer of intestine: Secondary | ICD-10-CM | POA: Diagnosis not present

## 2022-07-07 DIAGNOSIS — I480 Paroxysmal atrial fibrillation: Secondary | ICD-10-CM | POA: Diagnosis not present

## 2022-07-07 DIAGNOSIS — I5032 Chronic diastolic (congestive) heart failure: Secondary | ICD-10-CM | POA: Diagnosis not present

## 2022-07-07 DIAGNOSIS — M80061D Age-related osteoporosis with current pathological fracture, right lower leg, subsequent encounter for fracture with routine healing: Secondary | ICD-10-CM | POA: Diagnosis not present

## 2022-07-07 DIAGNOSIS — I110 Hypertensive heart disease with heart failure: Secondary | ICD-10-CM | POA: Diagnosis not present

## 2022-07-08 ENCOUNTER — Ambulatory Visit (INDEPENDENT_AMBULATORY_CARE_PROVIDER_SITE_OTHER): Payer: Medicare Other

## 2022-07-08 DIAGNOSIS — Z952 Presence of prosthetic heart valve: Secondary | ICD-10-CM | POA: Diagnosis not present

## 2022-07-08 DIAGNOSIS — I5032 Chronic diastolic (congestive) heart failure: Secondary | ICD-10-CM | POA: Diagnosis not present

## 2022-07-08 DIAGNOSIS — I480 Paroxysmal atrial fibrillation: Secondary | ICD-10-CM | POA: Diagnosis not present

## 2022-07-08 DIAGNOSIS — K633 Ulcer of intestine: Secondary | ICD-10-CM | POA: Diagnosis not present

## 2022-07-08 DIAGNOSIS — Z5181 Encounter for therapeutic drug level monitoring: Secondary | ICD-10-CM | POA: Diagnosis not present

## 2022-07-08 DIAGNOSIS — Z8679 Personal history of other diseases of the circulatory system: Secondary | ICD-10-CM | POA: Diagnosis not present

## 2022-07-08 DIAGNOSIS — M80061D Age-related osteoporosis with current pathological fracture, right lower leg, subsequent encounter for fracture with routine healing: Secondary | ICD-10-CM | POA: Diagnosis not present

## 2022-07-08 DIAGNOSIS — Z9889 Other specified postprocedural states: Secondary | ICD-10-CM | POA: Diagnosis not present

## 2022-07-08 DIAGNOSIS — E039 Hypothyroidism, unspecified: Secondary | ICD-10-CM | POA: Diagnosis not present

## 2022-07-08 DIAGNOSIS — I110 Hypertensive heart disease with heart failure: Secondary | ICD-10-CM | POA: Diagnosis not present

## 2022-07-08 LAB — POCT INR: INR: 1.6 — AB (ref 2.0–3.0)

## 2022-07-08 NOTE — Patient Instructions (Signed)
Description   Spoke with Caryl Pina, LPN at [facility] and pt, advised to take 2 tablets today and 1.5 tablets on Thursday and then START taking 1 tablet daily EXCEPT 1.5 tablets on Mondays. Remain consistent with greens.  Recheck INR in 1 week. Coumadin Clinic [phone].

## 2022-07-10 DIAGNOSIS — I110 Hypertensive heart disease with heart failure: Secondary | ICD-10-CM | POA: Diagnosis not present

## 2022-07-10 DIAGNOSIS — K633 Ulcer of intestine: Secondary | ICD-10-CM | POA: Diagnosis not present

## 2022-07-10 DIAGNOSIS — I480 Paroxysmal atrial fibrillation: Secondary | ICD-10-CM | POA: Diagnosis not present

## 2022-07-10 DIAGNOSIS — I5032 Chronic diastolic (congestive) heart failure: Secondary | ICD-10-CM | POA: Diagnosis not present

## 2022-07-10 DIAGNOSIS — M80061D Age-related osteoporosis with current pathological fracture, right lower leg, subsequent encounter for fracture with routine healing: Secondary | ICD-10-CM | POA: Diagnosis not present

## 2022-07-10 DIAGNOSIS — E039 Hypothyroidism, unspecified: Secondary | ICD-10-CM | POA: Diagnosis not present

## 2022-07-13 DIAGNOSIS — Z87440 Personal history of urinary (tract) infections: Secondary | ICD-10-CM | POA: Diagnosis not present

## 2022-07-13 DIAGNOSIS — Z853 Personal history of malignant neoplasm of breast: Secondary | ICD-10-CM | POA: Diagnosis not present

## 2022-07-13 DIAGNOSIS — K589 Irritable bowel syndrome without diarrhea: Secondary | ICD-10-CM | POA: Diagnosis not present

## 2022-07-13 DIAGNOSIS — B029 Zoster without complications: Secondary | ICD-10-CM | POA: Diagnosis not present

## 2022-07-13 DIAGNOSIS — Z7901 Long term (current) use of anticoagulants: Secondary | ICD-10-CM | POA: Diagnosis not present

## 2022-07-13 DIAGNOSIS — R159 Full incontinence of feces: Secondary | ICD-10-CM | POA: Diagnosis not present

## 2022-07-13 DIAGNOSIS — J449 Chronic obstructive pulmonary disease, unspecified: Secondary | ICD-10-CM | POA: Diagnosis not present

## 2022-07-13 DIAGNOSIS — I110 Hypertensive heart disease with heart failure: Secondary | ICD-10-CM | POA: Diagnosis not present

## 2022-07-13 DIAGNOSIS — G35 Multiple sclerosis: Secondary | ICD-10-CM | POA: Diagnosis not present

## 2022-07-13 DIAGNOSIS — Z9181 History of falling: Secondary | ICD-10-CM | POA: Diagnosis not present

## 2022-07-13 DIAGNOSIS — K633 Ulcer of intestine: Secondary | ICD-10-CM | POA: Diagnosis not present

## 2022-07-13 DIAGNOSIS — I480 Paroxysmal atrial fibrillation: Secondary | ICD-10-CM | POA: Diagnosis not present

## 2022-07-13 DIAGNOSIS — F419 Anxiety disorder, unspecified: Secondary | ICD-10-CM | POA: Diagnosis not present

## 2022-07-13 DIAGNOSIS — I5032 Chronic diastolic (congestive) heart failure: Secondary | ICD-10-CM | POA: Diagnosis not present

## 2022-07-13 DIAGNOSIS — N319 Neuromuscular dysfunction of bladder, unspecified: Secondary | ICD-10-CM | POA: Diagnosis not present

## 2022-07-13 DIAGNOSIS — Z8673 Personal history of transient ischemic attack (TIA), and cerebral infarction without residual deficits: Secondary | ICD-10-CM | POA: Diagnosis not present

## 2022-07-13 DIAGNOSIS — N39498 Other specified urinary incontinence: Secondary | ICD-10-CM | POA: Diagnosis not present

## 2022-07-13 DIAGNOSIS — M80061D Age-related osteoporosis with current pathological fracture, right lower leg, subsequent encounter for fracture with routine healing: Secondary | ICD-10-CM | POA: Diagnosis not present

## 2022-07-13 DIAGNOSIS — D649 Anemia, unspecified: Secondary | ICD-10-CM | POA: Diagnosis not present

## 2022-07-13 DIAGNOSIS — I272 Pulmonary hypertension, unspecified: Secondary | ICD-10-CM | POA: Diagnosis not present

## 2022-07-13 DIAGNOSIS — M170 Bilateral primary osteoarthritis of knee: Secondary | ICD-10-CM | POA: Diagnosis not present

## 2022-07-13 DIAGNOSIS — E039 Hypothyroidism, unspecified: Secondary | ICD-10-CM | POA: Diagnosis not present

## 2022-07-14 ENCOUNTER — Encounter: Payer: Self-pay | Admitting: Physician Assistant

## 2022-07-14 DIAGNOSIS — M80061D Age-related osteoporosis with current pathological fracture, right lower leg, subsequent encounter for fracture with routine healing: Secondary | ICD-10-CM | POA: Diagnosis not present

## 2022-07-14 DIAGNOSIS — K633 Ulcer of intestine: Secondary | ICD-10-CM | POA: Diagnosis not present

## 2022-07-14 DIAGNOSIS — I5032 Chronic diastolic (congestive) heart failure: Secondary | ICD-10-CM | POA: Diagnosis not present

## 2022-07-14 DIAGNOSIS — I110 Hypertensive heart disease with heart failure: Secondary | ICD-10-CM | POA: Diagnosis not present

## 2022-07-14 DIAGNOSIS — I480 Paroxysmal atrial fibrillation: Secondary | ICD-10-CM | POA: Diagnosis not present

## 2022-07-14 DIAGNOSIS — E039 Hypothyroidism, unspecified: Secondary | ICD-10-CM | POA: Diagnosis not present

## 2022-07-14 NOTE — Progress Notes (Unsigned)
Cardiology Office Note    Date:  07/15/2022   ID:  [name] [name], DOB 06-Apr-1951, MRN [id number]  PCP:  Ma Hillock, DO  Cardiologist:  Freada Bergeron, MD  Electrophysiologist:  Cristopher Peru, MD (only PRN now)  Chief Complaint: f/u MVR, PAF, cardiac issues below  History of Present Illness:   [name] [name] is a 71 y.o. 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  She was remotely followed by Dr. Ron Parker then Dr. Meda Coffee, and more recently Dr. Lovena Le and Dr. Johney Frame. She had prior hx of PAF and severe MR. Pre-op cath 01/2012 showed no significant CAD. She underwent MAZE and mechanical MV replacement 01/2012. In 2019 she had mild LV dysfunction with EF 45-50% so lisinopril was added. In 2020 she was seen for low HR readings felt to be due to pseudobradycardia from frequent PVCs (9.3% of overall beats, few short runs SVT, range 50-125bpm. She was seen by EP and preferred more natural, non-AAD approach. Last echo was in 04/2020 with EF 65-70%, normal RV, normal PASP, normal MVR. Of note, she had a stroke in 2022 which neurology felt was due to vertebral stenosis, fall and leg fractures 12/2021, and GIB 01/2022 felt due to fecal impaction versus stercoral ulcer.  She is seen back for follow-up today and feels that she has been doing well without any recurrent events since April. No CP, SOB, palpitations, syncope, or bleeding. She prefers her holistic doctor manage her cholesterol. She reports that her dentist takes care of prescribing her amoxicillin SBE prophylaxis (this is managed  with prescription medicine).   Labwork independently reviewed: 01/2022 Hgb 10.3, plt 436, Na 133, albumin 2.6, K 4.0, Cr 0.66, LFTs ok 12/2020 LDL 131, trig 53, TSH wnl  Cardiology Studies:   Studies reviewed are outlined and summarized above. Reports included below if pertinent.   Echo 04/2020   1. Left ventricular ejection fraction, by estimation, is 65 to 70%. The  left ventricle has normal function. The left ventricle has no regional  wall motion abnormalities. Left ventricular diastolic function could not  be evaluated.   2. Right ventricular systolic function is normal. The right ventricular  size is normal. There is normal pulmonary artery systolic pressure. The  estimated right ventricular systolic pressure is 81.8 mmHg.   3. The mitral valve is normal in structure. No evidence of mitral valve  regurgitation. No evidence of mitral stenosis. The mean mitral valve  gradient is 3.0 mmHg with average heart rate of 65 bpm. There is a 31 mm  Sorin present in the mitral position.   4. The aortic valve is normal in structure. Aortic valve regurgitation is  not visualized. No aortic stenosis is present.   5. The inferior vena cava is normal in size with greater than 50%  respiratory variability, suggesting right atrial pressure of 3 mmHg.   Monitor 2020 Sinus bradycardia to sinus tachycardia. Few very short runs of SVT. Very frequent PVCs (9.3% of overall beats).   Very frequent PVCs (9.3% of overall beats). Referral to EP is recommended.   Carotid 2013 Summary:  No significant extracranial carotid artery stenosis  demonstrated    Past Medical History:  Diagnosis Date   Anxiety    Arthritis    knees   Breast cancer ([place]) 1999   COPD (chronic obstructive pulmonary disease) ([place])    COPD with emphysema.. Assess by pulmonary team in the hospital April, 2013   GI bleed    Herpes    Hypothyroidism    IBS (irritable bowel syndrome)    Mitral valve regurgitation    Mitral  valve replacement April, 2013, Mitral valve prolapse   Multiple sclerosis ([place])    Neurogenic bladder    NICM (nonischemic cardiomyopathy) ([facility])    Osteoporosis    Ovarian cyst    Paroxysmal atrial fibrillation ([facility])    Rapid atrial fibrillation in-hospital, Rapid cardioversion,  before mitral valve surgery   Pulmonary hypertension ([place])    Echo, April, 2013, before mitral valve surgery   PVC's (premature ventricular contractions)    S/P Maze operation for atrial fibrillation 01/26/2012   Complete biatrial lesion set using cryothermy via right mini thoracotomy   S/P mitral valve replacement 01/26/2012   23m Sorin [employer] Optiform mechanical prosthesis via right mini thoracotomy   Stroke (cerebrum) ([facility]    Vertebral artery stenosis    right   Warfarin anticoagulation    Mechanical mitral prosthesis, April, 20136    Past Surgical History:  Procedure Laterality Date   BREAST LUMPECTOMY Right 1999   with sent.node, and axillary dissection (20)   CHEST TUBE INSERTION  01/26/2012   Procedure: CHEST TUBE INSERTION;  Surgeon: CRexene Alberts MD;  [facility]  Service: Open Heart Surgery;  Laterality: Left;   COLONOSCOPY  2010   "normal"   [facility]  urethral stricture repair   LEFT AND RIGHT HEART CATHETERIZATION WITH CORONARY ANGIOGRAM N/A 01/20/2012   Procedure: LEFT AND RIGHT HEART CATHETERIZATION WITH CORONARY ANGIOGRAM;  Surgeon: CBurnell Blanks MD;  [facility];  Service: Cardiovascular;  Laterality: N/A;   LYMPHADENECTOMY     MAZE  01/26/2012   Procedure: MAZE;  Surgeon: CRexene Alberts MD;  [place] [facility]  Service: Open Heart Surgery;  Laterality: N/A;   MITRAL VALVE REPLACEMENT  01/26/2012   Procedure: MINIMALLY INVASIVE MITRAL VALVE (MV) REPLACEMENT;  Surgeon: CRexene Alberts MD;  [facility]  Service: Open Heart Surgery;  Laterality: Right;   ORIF TIBIA PLATEAU Right 01/05/2022   Procedure: OPEN  REDUCTION INTERNAL FIXATION (ORIF) TIBIAL PLATEAU AND SHAFT;  Surgeon: HAltamese Aynor MD;  [facility]  Service: Orthopedics;  Laterality: Right;   TEE WITHOUT CARDIOVERSION  01/19/2012   Procedure: TRANSESOPHAGEAL ECHOCARDIOGRAM (TEE);  Surgeon: Peter M JMartinique MD;  [facility];  Service: Cardiovascular;  Laterality: N/A;   TONSILLECTOMY  [id number]  UMBILICAL HERNIA REPAIR  1952   WRIST SURGERY Right 2012    Current Medications: Current Meds  Medication Sig   acetaminophen (TYLENOL) 325 MG tablet Take 650 mg by mouth every 6 (six) hours. scheduled   Alpha-Lipoic Acid 100 MG CAPS Take by mouth 3 (three) times daily.   Biotin 10 MG CAPS Take 1 capsule by mouth 2 (two) times daily.   calcium carbonate (OS-CAL - DOSED IN MG OF ELEMENTAL CALCIUM) 1250 (500 Ca) MG tablet Take 1 tablet by mouth. Taking 1,500 mg by mouth every morning   Cholecalciferol (VITAMIN D3) 50 MCG (2000 UT) TABS Take 2,000-4,000 Units by mouth See admin  instructions. Take one tablet (2000 units) by mouth every other day and take 2 tablets (4000 units) every other day   Coenzyme Q10 (CO Q-10) 100 MG CAPS Take 100 mg by mouth every morning.   Cranberry 500 MG CAPS Take 500 mg by mouth 2 (two) times daily with a meal.   Cyanocobalamin (VITAMIN B-12) 1000 MCG SUBL Place 1,000 mcg under the tongue every morning.   fluticasone (FLONASE) 50 MCG/ACT nasal spray Place 1 spray into both nostrils daily. (Patient taking differently: Place 1 spray into both nostrils every morning.)   glucosamine-chondroitin 500-400 MG tablet Take 1 tablet by mouth 2 (two) times daily with a meal.   lisinopril (ZESTRIL) 5 MG tablet Take 1 tablet (5 mg total) by mouth daily. (Patient taking differently: Take 5 mg by mouth every morning.)   Lysine 500 MG CAPS Take 1,000 mg by mouth 2 (two) times daily.   Magnesium Citrate 100 MG TABS Take 200 mg by mouth every morning.   metoprolol tartrate (LOPRESSOR) 25 MG tablet TAKE 1 TAB (25 MG) PO TWICE  DAILY - pt must make appt with provider for further refills - 1st attempt (Patient taking differently: Take 25 mg by mouth 2 (two) times daily.)   Nutritional Supplements (BOOST VHC) LIQD Take 120 mLs by mouth in the morning and at bedtime.   Omega-3 Fatty Acids (FISH OIL) 1200 MG CAPS Take 1,200 mg by mouth 2 (two) times daily with a meal.   progesterone (PROMETRIUM) 100 MG capsule Take 100 mg by mouth every morning.   thyroid (ARMOUR) 90 MG tablet Take 90 mg by mouth every morning.   Turmeric 500 MG CAPS Take 500 mg by mouth every evening.   vitamin A 10000 UNIT capsule Take 10,000 Units by mouth every evening.   vitamin E 400 UNIT capsule Take 400 Units by mouth every evening.   warfarin (COUMADIN) 5 MG tablet Take 1 tablet by mouth daily except 1.5 tablets on Sundays, Tuesdays, and Thursdays or as directed Anticoagulation Clinic. (Patient taking differently: Take 5-7.5 mg by mouth See admin instructions. Takes '5mg'$   (1 tablet) daily except 7.5 gm (1.5 tablets) on Tuesday, Thursday and Sunday)   [DISCONTINUED] Black Cohosh 40 MG CAPS Take 40 mg by mouth 2 (two) times daily.   [DISCONTINUED] calcium carbonate (OSCAL) 1500 (600 Ca) MG TABS tablet Take 1,500 mg by mouth every morning.      Allergies:   Erythromycin, Atorvastatin, Lactose intolerance (gi), and Valsartan   Social History   Socioeconomic History   Marital status: Widowed    Spouse name: Not on file   Number of children: 2   Years of education: 75   Highest education level: Not on file  Occupational History   Occupation: Retired  Tobacco Use   Smoking status: Never   Smokeless tobacco: Never  Vaping Use   Vaping Use: Never used  Substance and Sexual Activity   Alcohol use: No   Drug use: No   Sexual activity: Never    Birth control/protection: None  Other Topics Concern   Not on file  Social History Narrative   Patient is a widower (since 65), he currently lives with her son and daughter-in-law. She has 2  children.   She is college educated, and retired from the [employer].   She uses herbal remedies, and multiple over-the-counter supplements. She takes a daily vitamin.   She wears her seatbelt, exercises routinely, smoke detector in the home.   Requires a walker  or wheelchair at times.   Feels safe in her relationships.   Social Determinants of Health   Financial Resource Strain: Low Risk  (05/20/2022)   Overall Financial Resource Strain (CARDIA)    Difficulty of Paying Living Expenses: Not hard at all  Food Insecurity: No Food Insecurity (05/20/2022)   Hunger Vital Sign    Worried About Running Out of Food in the Last Year: Never true    Ran Out of Food in the Last Year: Never true  Transportation Needs: No Transportation Needs (05/20/2022)   PRAPARE - Hydrologist (Medical): No    Lack of Transportation (Non-Medical): No  Physical Activity: Insufficiently Active (05/20/2022)   Exercise Vital Sign    Days of Exercise per Week: 2 days    Minutes of Exercise per Session: 60 min  Stress: No Stress Concern Present (05/20/2022)   [place]    Feeling of Stress : Not at all  Social Connections: Socially Isolated (05/20/2022)   Social Connection and Isolation Panel [NHANES]    Frequency of Communication with Friends and Family: More than three times a week    Frequency of Social Gatherings with Friends and Family: Never    Attends Religious Services: Never    Marine scientist or Organizations: No    Attends Archivist Meetings: Never    Marital Status: Widowed     Family History:  The patient's family history includes Breast cancer in her maternal aunt and maternal grandmother; CAD in her father, mother, and another family member; Heart disease in her father; Hyperlipidemia in her mother; Hypertension in her father and mother.  ROS:   Please see the history of present illness.   All other systems are reviewed and otherwise negative.    EKG(s)/Additional Labs   EKG:  EKG is ordered today, personally reviewed, demonstrating NSR 65bpm first degree AVB, LVH with QRS widening and secondary repol change. Has had varying degrees of STTW changes in the past. Today's tracing appears most similar to 2021.  Recent Labs: 01/21/2022: ALT 14; BUN 23; Creatinine, Ser 0.66; Hemoglobin 10.3; Platelets 436; Potassium 4.0; Sodium 133  Recent Lipid Panel    Component Value Date/Time   CHOL 204 (H) 01/12/2021 0207   TRIG 53 01/12/2021 0207   HDL 62 01/12/2021 0207   CHOLHDL 3.3 01/12/2021 0207   VLDL 11 01/12/2021 0207   LDLCALC 131 (H) 01/12/2021 0207    PHYSICAL EXAM:    VS:  BP 120/60   Pulse 65   Ht '5\' 7"'$  (1.702 m)   Wt 139 lb (63 kg)   SpO2 96%   BMI 21.77 kg/m   BMI: Body mass index is 21.77 kg/m.  GEN: Well nourished, well developed female in no acute distress HEENT: normocephalic, atraumatic Neck: no JVD, carotid bruits, or masses Cardiac: RRR; crisp valve sound, no murmurs, rubs, or gallops, no edema  Respiratory:  clear to auscultation bilaterally, normal work of breathing GI: soft, nontender, nondistended, + BS MS: no deformity or atrophy Skin: warm and dry, no rash Neuro:  Alert and Oriented x 3, Strength and sensation are intact, follows commands Psych: euthymic mood, full affect  Wt Readings from Last 3 Encounters:  07/15/22 139 lb (63 kg)  01/20/22 140 lb (63.5 kg)  01/05/22 145 lb (65.8 kg)     ASSESSMENT & PLAN:   1. MR s/p mechanical MVR 2013 - doing well clinically. Anticoagulated with warfarin.  I recommended rechecking blood count today. She declined. She is having labs at [facility] by her holistic doctor soon and will fax [place] a copy when the results are in. Reminder her of SBE ppx; she states this is prescribed by her dentist. We will repeat echocardiogram for surveillance given stroke in 2022. Otherwise she is doing well and will plan 1 year  f/u.  2. Prior NICM - no s/sx of HF. Continue low dose lisinopril. F/u EF on echocardiogram.  3. PAF, PVCs - quiescent. Continue metoprolol as ordered.  4. H/o stroke - per neuro notes, felt related to vertebral stenosis. Neuro notes indicate no indication for ASA in addition to her warfarin, so she is on warfarin on its own. She prefers that her holistic doctor continue to manage her lipids.   5. First degree AVB - no associated significant bradycardia at this time. Follow clinically.     Disposition: F/u with Dr. Johney Frame in 1 year.   Medication Adjustments/Labs and Tests Ordered: Current medicines are reviewed at length with the patient today.  Concerns regarding medicines are outlined above. Medication changes, Labs and Tests ordered today are summarized above and listed in the Patient Instructions accessible in Encounters.   Signed, Charlie Pitter, PA-C  07/15/2022 4:14 PM    [place] Phone: [phone]; Fax: [phone]

## 2022-07-15 ENCOUNTER — Encounter: Payer: Self-pay | Admitting: Physician Assistant

## 2022-07-15 ENCOUNTER — Ambulatory Visit: Payer: Medicare Other | Attending: Physician Assistant | Admitting: Physician Assistant

## 2022-07-15 ENCOUNTER — Ambulatory Visit (INDEPENDENT_AMBULATORY_CARE_PROVIDER_SITE_OTHER): Payer: Medicare Other

## 2022-07-15 VITALS — BP 120/60 | HR 65 | Ht 67.0 in | Wt 139.0 lb

## 2022-07-15 DIAGNOSIS — Z952 Presence of prosthetic heart valve: Secondary | ICD-10-CM

## 2022-07-15 DIAGNOSIS — I428 Other cardiomyopathies: Secondary | ICD-10-CM | POA: Diagnosis not present

## 2022-07-15 DIAGNOSIS — I5032 Chronic diastolic (congestive) heart failure: Secondary | ICD-10-CM | POA: Diagnosis not present

## 2022-07-15 DIAGNOSIS — E039 Hypothyroidism, unspecified: Secondary | ICD-10-CM | POA: Diagnosis not present

## 2022-07-15 DIAGNOSIS — I110 Hypertensive heart disease with heart failure: Secondary | ICD-10-CM | POA: Diagnosis not present

## 2022-07-15 DIAGNOSIS — M80061D Age-related osteoporosis with current pathological fracture, right lower leg, subsequent encounter for fracture with routine healing: Secondary | ICD-10-CM | POA: Diagnosis not present

## 2022-07-15 DIAGNOSIS — I493 Ventricular premature depolarization: Secondary | ICD-10-CM | POA: Diagnosis not present

## 2022-07-15 DIAGNOSIS — Z8673 Personal history of transient ischemic attack (TIA), and cerebral infarction without residual deficits: Secondary | ICD-10-CM

## 2022-07-15 DIAGNOSIS — I440 Atrioventricular block, first degree: Secondary | ICD-10-CM | POA: Diagnosis not present

## 2022-07-15 DIAGNOSIS — I480 Paroxysmal atrial fibrillation: Secondary | ICD-10-CM

## 2022-07-15 DIAGNOSIS — Z5181 Encounter for therapeutic drug level monitoring: Secondary | ICD-10-CM

## 2022-07-15 DIAGNOSIS — K633 Ulcer of intestine: Secondary | ICD-10-CM | POA: Diagnosis not present

## 2022-07-15 LAB — POCT INR: INR: 3 (ref 2.0–3.0)

## 2022-07-15 NOTE — Patient Instructions (Signed)
Endocarditis Information  You may be at risk for developing endocarditis since you have an artificial heart valve or a repaired heart valve. Endocarditis is an infection of the lining of the heart or heart valves. Certain surgical and dental procedures may put you at risk, such as teeth cleaning or other dental procedures or other medical procedures. Notify our office or your dentist before having any dental work or invasive/surgical procedures. You will need to take antibiotics before certain procedures. To prevent endocarditis, maintain good oral health. Seek prompt medical attention for any mouth/gum, skin or urinary tract infections.  Medication Instructions:  Your physician recommends that you continue on your current medications as directed. Please refer to the Current Medication list given to you today.  *If you need a refill on your cardiac medications before your next appointment, please call your pharmacy*   Lab Work: NONE **Please fax copies of upcoming blood work to [phone]** If you have labs (blood work) drawn today and your tests are completely normal, you will receive your results only by: [place] (if you have MyChart) OR A paper copy in the mail If you have any lab test that is abnormal or we need to change your treatment, we will call you to review the results.   Testing/Procedures: ECHO Your physician has requested that you have an echocardiogram. Echocardiography is a painless test that uses sound waves to create images of your heart. It provides your doctor with information about the size and shape of your heart and how well your heart's chambers and valves are working. This procedure takes approximately one hour. There are no restrictions for this procedure.  Follow-Up: At [facility], you and your health needs are our priority.  As part of our continuing mission to provide you with exceptional heart care, we have created designated Provider Care  Teams.  These Care Teams include your primary Cardiologist (physician) and Advanced Practice Providers (APPs -  Physician Assistants and Nurse Practitioners) who all work together to provide you with the care you need, when you need it.  Your next appointment:   1 year(s)  The format for your next appointment:   In Person  Provider:   Freada Bergeron, MD        Important Information About Sugar

## 2022-07-15 NOTE — Patient Instructions (Signed)
Description   Spoke with Mickel Baas, RN at [facility] and pt, advised to continue taking 1 tablet daily EXCEPT 1.5 tablets on Mondays.  Remain consistent with greens.  Recheck INR in 2 weeks. Coumadin Clinic [phone].

## 2022-07-16 ENCOUNTER — Telehealth (HOSPITAL_COMMUNITY): Payer: Self-pay | Admitting: Physician Assistant

## 2022-07-16 NOTE — Telephone Encounter (Signed)
I called patient to schedule echocardiogram. Patient had a fall and is in rehab and very busy. Patient states she will call us back when she is ready to schedule. Order will be removed from the active echo WQ and when patient calls back to reschedule we will reinstate the order. Thank you.

## 2022-07-16 NOTE — Telephone Encounter (Signed)
Will send this message to the ordering Provider and covering, as a general FYI.

## 2022-07-17 DIAGNOSIS — K633 Ulcer of intestine: Secondary | ICD-10-CM | POA: Diagnosis not present

## 2022-07-17 DIAGNOSIS — I110 Hypertensive heart disease with heart failure: Secondary | ICD-10-CM | POA: Diagnosis not present

## 2022-07-17 DIAGNOSIS — I480 Paroxysmal atrial fibrillation: Secondary | ICD-10-CM | POA: Diagnosis not present

## 2022-07-17 DIAGNOSIS — I5032 Chronic diastolic (congestive) heart failure: Secondary | ICD-10-CM | POA: Diagnosis not present

## 2022-07-17 DIAGNOSIS — E039 Hypothyroidism, unspecified: Secondary | ICD-10-CM | POA: Diagnosis not present

## 2022-07-17 DIAGNOSIS — M80061D Age-related osteoporosis with current pathological fracture, right lower leg, subsequent encounter for fracture with routine healing: Secondary | ICD-10-CM | POA: Diagnosis not present

## 2022-07-20 DIAGNOSIS — E039 Hypothyroidism, unspecified: Secondary | ICD-10-CM | POA: Diagnosis not present

## 2022-07-20 DIAGNOSIS — I110 Hypertensive heart disease with heart failure: Secondary | ICD-10-CM | POA: Diagnosis not present

## 2022-07-20 DIAGNOSIS — I480 Paroxysmal atrial fibrillation: Secondary | ICD-10-CM | POA: Diagnosis not present

## 2022-07-20 DIAGNOSIS — M80061D Age-related osteoporosis with current pathological fracture, right lower leg, subsequent encounter for fracture with routine healing: Secondary | ICD-10-CM | POA: Diagnosis not present

## 2022-07-20 DIAGNOSIS — K633 Ulcer of intestine: Secondary | ICD-10-CM | POA: Diagnosis not present

## 2022-07-20 DIAGNOSIS — I5032 Chronic diastolic (congestive) heart failure: Secondary | ICD-10-CM | POA: Diagnosis not present

## 2022-07-21 ENCOUNTER — Telehealth: Payer: Self-pay | Admitting: Cardiology

## 2022-07-21 DIAGNOSIS — I110 Hypertensive heart disease with heart failure: Secondary | ICD-10-CM | POA: Diagnosis not present

## 2022-07-21 DIAGNOSIS — E039 Hypothyroidism, unspecified: Secondary | ICD-10-CM | POA: Diagnosis not present

## 2022-07-21 DIAGNOSIS — K633 Ulcer of intestine: Secondary | ICD-10-CM | POA: Diagnosis not present

## 2022-07-21 DIAGNOSIS — I480 Paroxysmal atrial fibrillation: Secondary | ICD-10-CM | POA: Diagnosis not present

## 2022-07-21 DIAGNOSIS — M80061D Age-related osteoporosis with current pathological fracture, right lower leg, subsequent encounter for fracture with routine healing: Secondary | ICD-10-CM | POA: Diagnosis not present

## 2022-07-21 DIAGNOSIS — I5032 Chronic diastolic (congestive) heart failure: Secondary | ICD-10-CM | POA: Diagnosis not present

## 2022-07-21 MED ORDER — LISINOPRIL 5 MG PO TABS: 5.0000 mg | ORAL_TABLET | Freq: Every day | ORAL | 3 refills | Status: DC

## 2022-07-21 NOTE — Telephone Encounter (Signed)
Returned call to pt.  Left a detailed that we have sent in a refill for Lisinopril to [facility], per her request.

## 2022-07-21 NOTE — Telephone Encounter (Signed)
*  STAT* If patient is at the pharmacy, call can be transferred to refill team.   1. Which medications need to be refilled? (please list name of each medication and dose if known) lisinopril (ZESTRIL) 5 MG tablet  2. Which pharmacy/location (including street and city if local pharmacy) is medication to be sent to? [id number] - [place], [place] - [address] AT [address]  3. Do they need a 30 day or 90 day supply? 90 day  Patient has 1 tablet left.

## 2022-07-24 DIAGNOSIS — I110 Hypertensive heart disease with heart failure: Secondary | ICD-10-CM | POA: Diagnosis not present

## 2022-07-24 DIAGNOSIS — M80061D Age-related osteoporosis with current pathological fracture, right lower leg, subsequent encounter for fracture with routine healing: Secondary | ICD-10-CM | POA: Diagnosis not present

## 2022-07-24 DIAGNOSIS — E039 Hypothyroidism, unspecified: Secondary | ICD-10-CM | POA: Diagnosis not present

## 2022-07-24 DIAGNOSIS — I5032 Chronic diastolic (congestive) heart failure: Secondary | ICD-10-CM | POA: Diagnosis not present

## 2022-07-24 DIAGNOSIS — K633 Ulcer of intestine: Secondary | ICD-10-CM | POA: Diagnosis not present

## 2022-07-24 DIAGNOSIS — I480 Paroxysmal atrial fibrillation: Secondary | ICD-10-CM | POA: Diagnosis not present

## 2022-07-27 DIAGNOSIS — I110 Hypertensive heart disease with heart failure: Secondary | ICD-10-CM | POA: Diagnosis not present

## 2022-07-27 DIAGNOSIS — M80061D Age-related osteoporosis with current pathological fracture, right lower leg, subsequent encounter for fracture with routine healing: Secondary | ICD-10-CM | POA: Diagnosis not present

## 2022-07-27 DIAGNOSIS — I5032 Chronic diastolic (congestive) heart failure: Secondary | ICD-10-CM | POA: Diagnosis not present

## 2022-07-27 DIAGNOSIS — E039 Hypothyroidism, unspecified: Secondary | ICD-10-CM | POA: Diagnosis not present

## 2022-07-27 DIAGNOSIS — K633 Ulcer of intestine: Secondary | ICD-10-CM | POA: Diagnosis not present

## 2022-07-27 DIAGNOSIS — I480 Paroxysmal atrial fibrillation: Secondary | ICD-10-CM | POA: Diagnosis not present

## 2022-07-28 DIAGNOSIS — M80061D Age-related osteoporosis with current pathological fracture, right lower leg, subsequent encounter for fracture with routine healing: Secondary | ICD-10-CM | POA: Diagnosis not present

## 2022-07-28 DIAGNOSIS — I480 Paroxysmal atrial fibrillation: Secondary | ICD-10-CM | POA: Diagnosis not present

## 2022-07-28 DIAGNOSIS — E039 Hypothyroidism, unspecified: Secondary | ICD-10-CM | POA: Diagnosis not present

## 2022-07-28 DIAGNOSIS — I5032 Chronic diastolic (congestive) heart failure: Secondary | ICD-10-CM | POA: Diagnosis not present

## 2022-07-28 DIAGNOSIS — I110 Hypertensive heart disease with heart failure: Secondary | ICD-10-CM | POA: Diagnosis not present

## 2022-07-28 DIAGNOSIS — K633 Ulcer of intestine: Secondary | ICD-10-CM | POA: Diagnosis not present

## 2022-07-29 ENCOUNTER — Ambulatory Visit (INDEPENDENT_AMBULATORY_CARE_PROVIDER_SITE_OTHER): Payer: Medicare Other

## 2022-07-29 DIAGNOSIS — Z5181 Encounter for therapeutic drug level monitoring: Secondary | ICD-10-CM

## 2022-07-29 DIAGNOSIS — Z9889 Other specified postprocedural states: Secondary | ICD-10-CM

## 2022-07-29 DIAGNOSIS — I5032 Chronic diastolic (congestive) heart failure: Secondary | ICD-10-CM

## 2022-07-29 DIAGNOSIS — M80061D Age-related osteoporosis with current pathological fracture, right lower leg, subsequent encounter for fracture with routine healing: Secondary | ICD-10-CM | POA: Diagnosis not present

## 2022-07-29 DIAGNOSIS — Z952 Presence of prosthetic heart valve: Secondary | ICD-10-CM | POA: Diagnosis not present

## 2022-07-29 DIAGNOSIS — E039 Hypothyroidism, unspecified: Secondary | ICD-10-CM | POA: Diagnosis not present

## 2022-07-29 DIAGNOSIS — I480 Paroxysmal atrial fibrillation: Secondary | ICD-10-CM | POA: Diagnosis not present

## 2022-07-29 DIAGNOSIS — K633 Ulcer of intestine: Secondary | ICD-10-CM | POA: Diagnosis not present

## 2022-07-29 DIAGNOSIS — I110 Hypertensive heart disease with heart failure: Secondary | ICD-10-CM | POA: Diagnosis not present

## 2022-07-29 DIAGNOSIS — Z8679 Personal history of other diseases of the circulatory system: Secondary | ICD-10-CM

## 2022-07-29 LAB — POCT INR: INR: 2.5 (ref 2.0–3.0)

## 2022-07-29 NOTE — Patient Instructions (Signed)
Description   Spoke with Mickel Baas, RN at [facility] and pt, advised to continue taking 1 tablet daily EXCEPT 1.5 tablets on Tuesdays.  Remain consistent with greens.  Recheck INR in 2 weeks. Coumadin Clinic [phone].

## 2022-07-31 DIAGNOSIS — K633 Ulcer of intestine: Secondary | ICD-10-CM | POA: Diagnosis not present

## 2022-07-31 DIAGNOSIS — I480 Paroxysmal atrial fibrillation: Secondary | ICD-10-CM | POA: Diagnosis not present

## 2022-07-31 DIAGNOSIS — E039 Hypothyroidism, unspecified: Secondary | ICD-10-CM | POA: Diagnosis not present

## 2022-07-31 DIAGNOSIS — I5032 Chronic diastolic (congestive) heart failure: Secondary | ICD-10-CM | POA: Diagnosis not present

## 2022-07-31 DIAGNOSIS — M80061D Age-related osteoporosis with current pathological fracture, right lower leg, subsequent encounter for fracture with routine healing: Secondary | ICD-10-CM | POA: Diagnosis not present

## 2022-07-31 DIAGNOSIS — I110 Hypertensive heart disease with heart failure: Secondary | ICD-10-CM | POA: Diagnosis not present

## 2022-08-04 DIAGNOSIS — I110 Hypertensive heart disease with heart failure: Secondary | ICD-10-CM | POA: Diagnosis not present

## 2022-08-04 DIAGNOSIS — M80061D Age-related osteoporosis with current pathological fracture, right lower leg, subsequent encounter for fracture with routine healing: Secondary | ICD-10-CM | POA: Diagnosis not present

## 2022-08-04 DIAGNOSIS — E039 Hypothyroidism, unspecified: Secondary | ICD-10-CM | POA: Diagnosis not present

## 2022-08-04 DIAGNOSIS — I5032 Chronic diastolic (congestive) heart failure: Secondary | ICD-10-CM | POA: Diagnosis not present

## 2022-08-04 DIAGNOSIS — I480 Paroxysmal atrial fibrillation: Secondary | ICD-10-CM | POA: Diagnosis not present

## 2022-08-04 DIAGNOSIS — K633 Ulcer of intestine: Secondary | ICD-10-CM | POA: Diagnosis not present

## 2022-08-05 DIAGNOSIS — I110 Hypertensive heart disease with heart failure: Secondary | ICD-10-CM | POA: Diagnosis not present

## 2022-08-05 DIAGNOSIS — M80061D Age-related osteoporosis with current pathological fracture, right lower leg, subsequent encounter for fracture with routine healing: Secondary | ICD-10-CM | POA: Diagnosis not present

## 2022-08-05 DIAGNOSIS — E039 Hypothyroidism, unspecified: Secondary | ICD-10-CM | POA: Diagnosis not present

## 2022-08-05 DIAGNOSIS — I480 Paroxysmal atrial fibrillation: Secondary | ICD-10-CM | POA: Diagnosis not present

## 2022-08-05 DIAGNOSIS — K633 Ulcer of intestine: Secondary | ICD-10-CM | POA: Diagnosis not present

## 2022-08-05 DIAGNOSIS — I5032 Chronic diastolic (congestive) heart failure: Secondary | ICD-10-CM | POA: Diagnosis not present

## 2022-08-07 DIAGNOSIS — E039 Hypothyroidism, unspecified: Secondary | ICD-10-CM | POA: Diagnosis not present

## 2022-08-07 DIAGNOSIS — I110 Hypertensive heart disease with heart failure: Secondary | ICD-10-CM | POA: Diagnosis not present

## 2022-08-07 DIAGNOSIS — K633 Ulcer of intestine: Secondary | ICD-10-CM | POA: Diagnosis not present

## 2022-08-07 DIAGNOSIS — M80061D Age-related osteoporosis with current pathological fracture, right lower leg, subsequent encounter for fracture with routine healing: Secondary | ICD-10-CM | POA: Diagnosis not present

## 2022-08-07 DIAGNOSIS — I5032 Chronic diastolic (congestive) heart failure: Secondary | ICD-10-CM | POA: Diagnosis not present

## 2022-08-07 DIAGNOSIS — I480 Paroxysmal atrial fibrillation: Secondary | ICD-10-CM | POA: Diagnosis not present

## 2022-08-10 DIAGNOSIS — K633 Ulcer of intestine: Secondary | ICD-10-CM | POA: Diagnosis not present

## 2022-08-10 DIAGNOSIS — I5032 Chronic diastolic (congestive) heart failure: Secondary | ICD-10-CM | POA: Diagnosis not present

## 2022-08-10 DIAGNOSIS — E039 Hypothyroidism, unspecified: Secondary | ICD-10-CM | POA: Diagnosis not present

## 2022-08-10 DIAGNOSIS — I480 Paroxysmal atrial fibrillation: Secondary | ICD-10-CM | POA: Diagnosis not present

## 2022-08-10 DIAGNOSIS — M80061D Age-related osteoporosis with current pathological fracture, right lower leg, subsequent encounter for fracture with routine healing: Secondary | ICD-10-CM | POA: Diagnosis not present

## 2022-08-10 DIAGNOSIS — I110 Hypertensive heart disease with heart failure: Secondary | ICD-10-CM | POA: Diagnosis not present

## 2022-08-11 ENCOUNTER — Ambulatory Visit (INDEPENDENT_AMBULATORY_CARE_PROVIDER_SITE_OTHER): Payer: Medicare Other | Admitting: Cardiology

## 2022-08-11 DIAGNOSIS — Z5181 Encounter for therapeutic drug level monitoring: Secondary | ICD-10-CM | POA: Diagnosis not present

## 2022-08-11 DIAGNOSIS — K633 Ulcer of intestine: Secondary | ICD-10-CM | POA: Diagnosis not present

## 2022-08-11 DIAGNOSIS — E039 Hypothyroidism, unspecified: Secondary | ICD-10-CM | POA: Diagnosis not present

## 2022-08-11 DIAGNOSIS — I480 Paroxysmal atrial fibrillation: Secondary | ICD-10-CM | POA: Diagnosis not present

## 2022-08-11 DIAGNOSIS — M80061D Age-related osteoporosis with current pathological fracture, right lower leg, subsequent encounter for fracture with routine healing: Secondary | ICD-10-CM | POA: Diagnosis not present

## 2022-08-11 DIAGNOSIS — I5032 Chronic diastolic (congestive) heart failure: Secondary | ICD-10-CM | POA: Diagnosis not present

## 2022-08-11 DIAGNOSIS — I110 Hypertensive heart disease with heart failure: Secondary | ICD-10-CM | POA: Diagnosis not present

## 2022-08-11 LAB — POCT INR: INR: 1.7 — AB (ref 2.0–3.0)

## 2022-08-12 ENCOUNTER — Encounter: Payer: Self-pay | Admitting: Family Medicine

## 2022-08-12 ENCOUNTER — Telehealth: Payer: Self-pay | Admitting: Family Medicine

## 2022-08-12 ENCOUNTER — Ambulatory Visit (HOSPITAL_BASED_OUTPATIENT_CLINIC_OR_DEPARTMENT_OTHER)
Admission: RE | Admit: 2022-08-12 | Discharge: 2022-08-12 | Disposition: A | Discharge status: Home / Self Care | Payer: Medicare Other | Source: Ambulatory Visit | Attending: Family Medicine | Admitting: Family Medicine

## 2022-08-12 ENCOUNTER — Ambulatory Visit (INDEPENDENT_AMBULATORY_CARE_PROVIDER_SITE_OTHER): Payer: Medicare Other | Admitting: Family Medicine

## 2022-08-12 VITALS — BP 124/75 | HR 75 | Temp 98.0°F | Wt 136.6 lb

## 2022-08-12 DIAGNOSIS — Z9181 History of falling: Secondary | ICD-10-CM | POA: Diagnosis not present

## 2022-08-12 DIAGNOSIS — Z853 Personal history of malignant neoplasm of breast: Secondary | ICD-10-CM | POA: Diagnosis not present

## 2022-08-12 DIAGNOSIS — Z79899 Other long term (current) drug therapy: Secondary | ICD-10-CM | POA: Diagnosis not present

## 2022-08-12 DIAGNOSIS — E039 Hypothyroidism, unspecified: Secondary | ICD-10-CM | POA: Diagnosis not present

## 2022-08-12 DIAGNOSIS — G35 Multiple sclerosis: Secondary | ICD-10-CM | POA: Diagnosis not present

## 2022-08-12 DIAGNOSIS — M170 Bilateral primary osteoarthritis of knee: Secondary | ICD-10-CM | POA: Diagnosis not present

## 2022-08-12 DIAGNOSIS — N950 Postmenopausal bleeding: Secondary | ICD-10-CM

## 2022-08-12 DIAGNOSIS — I5032 Chronic diastolic (congestive) heart failure: Secondary | ICD-10-CM | POA: Diagnosis not present

## 2022-08-12 DIAGNOSIS — N80202 Endometriosis of left fallopian tube, unspecified depth: Secondary | ICD-10-CM | POA: Diagnosis not present

## 2022-08-12 DIAGNOSIS — Z87440 Personal history of urinary (tract) infections: Secondary | ICD-10-CM | POA: Diagnosis not present

## 2022-08-12 DIAGNOSIS — F419 Anxiety disorder, unspecified: Secondary | ICD-10-CM | POA: Diagnosis not present

## 2022-08-12 DIAGNOSIS — M80061D Age-related osteoporosis with current pathological fracture, right lower leg, subsequent encounter for fracture with routine healing: Secondary | ICD-10-CM | POA: Diagnosis not present

## 2022-08-12 DIAGNOSIS — B029 Zoster without complications: Secondary | ICD-10-CM | POA: Diagnosis not present

## 2022-08-12 DIAGNOSIS — Z8673 Personal history of transient ischemic attack (TIA), and cerebral infarction without residual deficits: Secondary | ICD-10-CM | POA: Diagnosis not present

## 2022-08-12 DIAGNOSIS — K633 Ulcer of intestine: Secondary | ICD-10-CM | POA: Diagnosis not present

## 2022-08-12 DIAGNOSIS — I4891 Unspecified atrial fibrillation: Secondary | ICD-10-CM | POA: Diagnosis not present

## 2022-08-12 DIAGNOSIS — N319 Neuromuscular dysfunction of bladder, unspecified: Secondary | ICD-10-CM | POA: Diagnosis not present

## 2022-08-12 DIAGNOSIS — I480 Paroxysmal atrial fibrillation: Secondary | ICD-10-CM | POA: Diagnosis not present

## 2022-08-12 DIAGNOSIS — Z7901 Long term (current) use of anticoagulants: Secondary | ICD-10-CM | POA: Diagnosis not present

## 2022-08-12 DIAGNOSIS — R159 Full incontinence of feces: Secondary | ICD-10-CM | POA: Diagnosis not present

## 2022-08-12 DIAGNOSIS — N39498 Other specified urinary incontinence: Secondary | ICD-10-CM | POA: Diagnosis not present

## 2022-08-12 DIAGNOSIS — Z5181 Encounter for therapeutic drug level monitoring: Secondary | ICD-10-CM | POA: Diagnosis not present

## 2022-08-12 DIAGNOSIS — D649 Anemia, unspecified: Secondary | ICD-10-CM | POA: Diagnosis not present

## 2022-08-12 DIAGNOSIS — I272 Pulmonary hypertension, unspecified: Secondary | ICD-10-CM | POA: Diagnosis not present

## 2022-08-12 DIAGNOSIS — K589 Irritable bowel syndrome without diarrhea: Secondary | ICD-10-CM | POA: Diagnosis not present

## 2022-08-12 DIAGNOSIS — I110 Hypertensive heart disease with heart failure: Secondary | ICD-10-CM | POA: Diagnosis not present

## 2022-08-12 DIAGNOSIS — J449 Chronic obstructive pulmonary disease, unspecified: Secondary | ICD-10-CM | POA: Diagnosis not present

## 2022-08-12 NOTE — Telephone Encounter (Signed)
Pt called and stated that she wanted to speak with a nurse in the office. She declined scheduling an appt and wanted to speak with a nurse first. She informed me that she has been going through menopause since 1999 and yesterday she began to bleed as though she was having a period. Please call patient back at [phone]

## 2022-08-12 NOTE — Telephone Encounter (Signed)
Pt scheduled  

## 2022-08-12 NOTE — Patient Instructions (Signed)
Postmenopausal Bleeding Postmenopausal bleeding is any bleeding that occurs after menopause. Menopause is a time in a woman's life when monthly periods stop. Any type of bleeding after menopause should be checked by your doctor. Treatment will depend on the cause. This kind of bleeding can be caused by: Taking hormones during menopause. Low or high amounts of female hormones in the body. This can cause the lining of the womb (uterus) to become too thin or too thick. Cancer. Growths in the womb that are not cancer. Follow these instructions at home:  Watch for any changes in your symptoms. Let your doctor know about them. Avoid using tampons and douches as told by your doctor. Change your pads regularly. Get regular pelvic exams. This includes Pap tests. Take iron pills as told by your doctor. Take over-the-counter and prescription medicines only as told by your doctor. Keep all follow-up visits. Contact a doctor if: You have new bleeding from the vagina after menopause. You have pain in your belly (abdomen). Get help right away if: You have a fever or chills. You have very bad pain with bleeding. You have clumps of blood (blood clots) coming from your vagina. You have a lot of bleeding, and: You use more than 1 pad an hour. This kind of bleeding has never happened before. You have headaches. You feel dizzy or you feel like you are going to pass out (faint). Summary Any type of bleeding after menopause should be checked by your doctor. Avoid using tampons or douches. Get regular pelvic exams. This includes Pap tests. Contact a doctor if you have new bleeding or pain in your belly. Watch for any changes in your symptoms. Let your doctor know about them. This information is not intended to replace advice given to you by your health care provider. Make sure you discuss any questions you have with your health care provider. Document Revised: 03/21/2020 Document Reviewed:  03/21/2020 Elsevier Patient Education  2023 [employer]vier [employer].  

## 2022-08-12 NOTE — Progress Notes (Signed)
[name] [name] , 19-Feb-1951, 71 y.o., female MRN: [id number] Patient Care Team    Relationship Specialty Notifications Start End  Ma Hillock, DO PCP - General Family Medicine  07/31/16   Freada Bergeron, MD PCP - Cardiology Cardiology  07/14/22   Evans Lance, MD PCP - Electrophysiology Cardiology  07/14/22   Elsie Stain, MD Attending Physician Pulmonary Disease Abnormal results only, Admissions 03/03/12   Wallene Huh, DPM Consulting Physician Podiatry  08/11/16   Dorothy Spark, MD Consulting Physician Cardiology  08/11/16   Mingo Amber, MD  Alternative Medicine  08/11/16   Ditty, Kevan Ny, MD Consulting Physician Neurosurgery  08/11/16   Princess Bruins, MD Consulting Physician Obstetrics and Gynecology  09/18/16   Vinnie Level  Dentistry  09/18/16   Druscilla Brownie, MD Consulting Physician Dermatology  09/18/16   Pieter Partridge, DO Consulting Physician Neurology  12/19/18   Calvert Cantor, MD Consulting Physician Ophthalmology  03/28/19    Comment: [place] [facility]    Chief Complaint  Patient presents with   Vaginal Bleeding    Noticed last night/ almost nauseous/shaky/passing clots     Subjective: Pt presents for an OV with complaints of postmenopausal vaginal bleeding.  She is on Coumadin and reports her dose was increased yesterday.  She noticed vaginal bleeding that started last night and she reports it can be like a gush when she sits on the toilet and passing dime sized to quarter size clots.  She states she was becoming a little nauseous last night but denies any dizziness, weakness or lightheadedness currently. She reports she has not had a menstrual cycle since 1999 when she had her chemotherapy for her breast cancer.  She currently is not established with a gynecologist, was established with [facility] gynecology in 2017.     05/20/2022    2:52 PM 02/04/2022    1:28 PM 05/14/2021    3:04 PM 02/06/2021    2:46 PM 12/18/2019    4:16 PM   Depression screen PHQ 2/9  Decreased Interest 0 0 0 0 0  Down, Depressed, Hopeless 0 0 0 0 0  PHQ - 2 Score 0 0 0 0 0  Tired, decreased energy    1   Change in appetite    0   Feeling bad or failure about yourself     0   Trouble concentrating    0   Moving slowly or fidgety/restless    0   Suicidal thoughts    0   Difficult doing work/chores    Not difficult at all     Allergies  Allergen Reactions   Erythromycin Nausea And Vomiting   Atorvastatin Other (See Comments)    Pt reports causes body to be stiff, joints ache, aching in knees and ankles, feels more fatigued.    Lactose Intolerance (Gi) Other (See Comments)    Upset stomach - yoghurt   Valsartan Other (See Comments)    Pt reports caused her depression   Social History   Social History Narrative   Patient is a widower (since 24), he currently lives with her son and daughter-in-law. She has 2 children.   She is college educated, and retired from the [employer].   She uses herbal remedies, and multiple over-the-counter supplements. She takes a daily vitamin.   She wears her seatbelt, exercises routinely, smoke detector in the home.   Requires a walker or wheelchair at times.  Feels safe in her relationships.   Past Medical History:  Diagnosis Date   Anxiety    Arthritis    knees   Breast cancer ([place]) 1999   COPD (chronic obstructive pulmonary disease) ([place])    COPD with emphysema.. Assess by pulmonary team in the hospital April, 2013   GI bleed    Herpes    Hypothyroidism    IBS (irritable bowel syndrome)    Mitral valve regurgitation    Mitral valve replacement April, 2013, Mitral valve prolapse   Multiple sclerosis ([place])    Neurogenic bladder    NICM (nonischemic cardiomyopathy) (HCC)    Osteoporosis    Ovarian cyst    Paroxysmal atrial fibrillation (HCC)    Rapid atrial fibrillation in-hospital, Rapid cardioversion,  before mitral valve surgery   Pulmonary hypertension ([place])    Echo, April,  2013, before mitral valve surgery   PVC's (premature ventricular contractions)    S/P Maze operation for atrial fibrillation 01/26/2012   Complete biatrial lesion set using cryothermy via right mini thoracotomy   S/P mitral valve replacement 01/26/2012   62m Sorin [employer] Optiform mechanical prosthesis via right mini thoracotomy   Stroke (cerebrum) [place]    Vertebral artery stenosis    right   Warfarin anticoagulation    Mechanical mitral prosthesis, April, 20136   Past Surgical History:  Procedure Laterality Date   BREAST LUMPECTOMY Right 1999   with sent.node, and axillary dissection (18)   CHEST TUBE INSERTION  01/26/2012   Procedure: CHEST TUBE INSERTION;  Surgeon: CRexene Alberts MD;  [place]  Service: Open Heart Surgery;  Laterality: Left;   COLONOSCOPY  2010   "normal"   CTetherow  urethral stricture repair   LEFT AND RIGHT HEART CATHETERIZATION WITH CORONARY ANGIOGRAM N/A 01/20/2012   Procedure: LEFT AND RIGHT HEART CATHETERIZATION WITH CORONARY ANGIOGRAM;  Surgeon: CBurnell Blanks MD;  [facility];  Service: Cardiovascular;  Laterality: N/A;   LYMPHADENECTOMY     MAZE  01/26/2012   Procedure: MAZE;  Surgeon: CRexene Alberts MD;  [place]  Service: Open Heart Surgery;  Laterality: N/A;   MITRAL VALVE REPLACEMENT  01/26/2012   Procedure: MINIMALLY INVASIVE MITRAL VALVE (MV) REPLACEMENT;  Surgeon: CRexene Alberts MD;  [place]  Service: Open Heart Surgery;  Laterality: Right;   ORIF TIBIA PLATEAU Right 01/05/2022   Procedure: OPEN REDUCTION INTERNAL FIXATION (ORIF) TIBIAL PLATEAU AND SHAFT;  Surgeon: HAltamese Fairfield Harbour MD;  [place] [facility]  Service: Orthopedics;  Laterality: Right;   TEE WITHOUT CARDIOVERSION  01/19/2012   Procedure: TRANSESOPHAGEAL ECHOCARDIOGRAM (TEE);  Surgeon: Peter M JMartinique MD;  [facility];  Service: Cardiovascular;  Laterality: N/A;   TONSILLECTOMY  18416   UMBILICAL HERNIA REPAIR  1952   WRIST SURGERY Right 2012   Family History  Problem Relation Age of Onset   Heart disease Father        cardiac arrest    CAD Father    Hypertension Father    CAD Mother        5 stents and numerous bypass surgery   Hypertension Mother    Hyperlipidemia Mother    CAD Other    Breast cancer Maternal Grandmother    Breast cancer Maternal Aunt    Allergies as of 08/12/2022       Reactions   Erythromycin Nausea And Vomiting   Atorvastatin Other (See Comments)  Pt reports causes body to be stiff, joints ache, aching in knees and ankles, feels more fatigued.    Lactose Intolerance (gi) Other (See Comments)   Upset stomach - yoghurt   Valsartan Other (See Comments)   Pt reports caused her depression        Medication List        Accurate as of August 12, 2022  4:53 PM. If you have any questions, ask your nurse or doctor.          acetaminophen 325 MG tablet Commonly known as: TYLENOL Take 650 mg by mouth every 6 (six) hours. scheduled   Alpha-Lipoic Acid 100 MG Caps Take by mouth 3 (three) times daily.   Biotin 10 MG Caps Take 1 capsule by mouth 2 (two) times daily.   Boost VHC Liqd Take 120 mLs by mouth in the morning and at bedtime.   calcium carbonate 1250 (500 Ca) MG tablet Commonly known as: OS-CAL - dosed in mg of elemental calcium Take 1 tablet by mouth. Taking 1,500 mg by mouth every morning   Co Q-10 100 MG Caps Take 100 mg by mouth every morning.   Cranberry 500 MG Caps Take 500 mg by mouth 2 (two) times daily with a meal.   Fish Oil 1200 MG Caps Take 1,200 mg by mouth 2 (two) times daily with a meal.   fluticasone 50 MCG/ACT nasal spray Commonly known as: FLONASE Place 1 spray into both nostrils daily. What changed: when to take this   glucosamine-chondroitin 500-400 MG tablet Take 1 tablet by mouth 2 (two) times daily with a meal.   lisinopril 5 MG tablet Commonly known as: ZESTRIL Take 1 tablet (5 mg  total) by mouth daily.   Lysine 500 MG Caps Take 1,000 mg by mouth 2 (two) times daily.   Magnesium Citrate 100 MG Tabs Take 200 mg by mouth every morning.   metoprolol tartrate 25 MG tablet Commonly known as: LOPRESSOR TAKE 1 TAB (25 MG) PO TWICE DAILY - pt must make appt with provider for further refills - 1st attempt What changed:  how much to take how to take this when to take this additional instructions   progesterone 100 MG capsule Commonly known as: PROMETRIUM Take 100 mg by mouth every morning.   thyroid 90 MG tablet Commonly known as: ARMOUR Take 90 mg by mouth every morning.   Turmeric 500 MG Caps Take 500 mg by mouth every evening.   vitamin A 10000 UNIT capsule Take 10,000 Units by mouth every evening.   Vitamin B-12 1000 MCG Subl Place 1,000 mcg under the tongue every morning.   Vitamin D3 50 MCG (2000 UT) Tabs Take 2,000-4,000 Units by mouth See admin instructions. Take one tablet (2000 units) by mouth every other day and take 2 tablets (4000 units) every other day   vitamin E 180 MG (400 UNITS) capsule Take 400 Units by mouth every evening.   warfarin 5 MG tablet Commonly known as: Coumadin Take as directed by the anticoagulation clinic. If you are unsure how to take this medication, talk to your nurse or doctor. Original instructions: Take 1 tablet by mouth daily except 1.5 tablets on Sundays, Tuesdays, and Thursdays or as directed Anticoagulation Clinic. What changed:  how much to take how to take this when to take this additional instructions        All past medical history, surgical history, allergies, family history, immunizations andmedications were updated in the EMR today and reviewed under  the history and medication portions of their EMR.     ROS Negative, with the exception of above mentioned in HPI   Objective:  BP 124/75   Pulse 75   Temp 98 F (36.7 C)   Wt 136 lb 9.6 oz (62 kg)   SpO2 97%   BMI 21.39 kg/m  Body mass  index is 21.39 kg/m. Physical Exam Vitals and nursing note reviewed.  Constitutional:      General: She is not in acute distress.    Appearance: Normal appearance. She is normal weight. She is not ill-appearing or toxic-appearing.  Eyes:     Extraocular Movements: Extraocular movements intact.     Conjunctiva/sclera: Conjunctivae normal.     Pupils: Pupils are equal, round, and reactive to light.  Cardiovascular:     Rate and Rhythm: Normal rate and regular rhythm.     Heart sounds: No murmur heard. Pulmonary:     Effort: Pulmonary effort is normal. No respiratory distress.     Breath sounds: Normal breath sounds. No wheezing, rhonchi or rales.  Abdominal:     General: Abdomen is flat. There is no distension.     Palpations: Abdomen is soft.     Tenderness: There is no abdominal tenderness. There is no guarding or rebound.  Neurological:     Mental Status: She is alert and oriented to person, place, and time. Mental status is at baseline.  Psychiatric:        Mood and Affect: Mood normal.        Behavior: Behavior normal.        Thought Content: Thought content normal.        Judgment: Judgment normal.      No results found. No results found. No results found. However, due to the size of the patient record, not all encounters were searched. Please check Results Review for a complete set of results.  Assessment/Plan: MITSUE PEERY is a 71 y.o. female present for OV for  Postmenopausal bleeding It was difficult to gauge amount of blood loss from last 24 hours by her report.  Her vital signs are stable and she is asymptomatic at this time. We discussed that any postmenopausal bleeding is taking very seriously to rule out uterine cancer.  No postmenopausal bleeding is considered normal. Would not suspect a mild increase in her warfarin dose last night would cause her to have vaginal bleeding last night. - Ambulatory referral to Gynecology - CBC w/Diff - US PELVIC COMPLETE  WITH TRANSVAGINAL; Future Patient was strongly encouraged to report to the emergency room if vaginal bleeding worsens, abdominal pain, fever, chills, weakness, fatigue or feeling lightheaded.   Reviewed expectations re: course of current medical issues. Discussed self-management of symptoms. Outlined signs and symptoms indicating need for more acute intervention. Patient verbalized understanding and all questions were answered. Patient received an After-Visit Summary.    Orders Placed This Encounter  Procedures   US PELVIC COMPLETE WITH TRANSVAGINAL   CBC w/Diff   Ambulatory referral to Gynecology   No orders of the defined types were placed in this encounter.  Referral Orders         Ambulatory referral to Gynecology       Note is dictated utilizing voice recognition software. Although note has been proof read prior to signing, occasional typographical errors still can be missed. If any questions arise, please do not hesitate to call for verification.   electronically signed by:  Howard Pouch, DO  [facility] - [place]

## 2022-08-13 ENCOUNTER — Telehealth: Payer: Self-pay | Admitting: Family Medicine

## 2022-08-13 ENCOUNTER — Other Ambulatory Visit: Payer: Self-pay

## 2022-08-13 DIAGNOSIS — N951 Menopausal and female climacteric states: Secondary | ICD-10-CM | POA: Diagnosis not present

## 2022-08-13 LAB — CBC WITH DIFFERENTIAL/PLATELET
Absolute Monocytes: 627 cells/uL (ref 200–950)
Basophils Absolute: 40 cells/uL (ref 0–200)
Basophils Relative: 0.6 %
Eosinophils Absolute: 40 cells/uL (ref 15–500)
Eosinophils Relative: 0.6 %
HCT: 39 % (ref 35.0–45.0)
Hemoglobin: 13.5 g/dL (ref 11.7–15.5)
Lymphs Abs: 891 cells/uL (ref 850–3900)
MCH: 31.8 pg (ref 27.0–33.0)
MCHC: 34.6 g/dL (ref 32.0–36.0)
MCV: 91.8 fL (ref 80.0–100.0)
MPV: 11 fL (ref 7.5–12.5)
Monocytes Relative: 9.5 %
Neutro Abs: 5003 cells/uL (ref 1500–7800)
Neutrophils Relative %: 75.8 %
Platelets: 267 10*3/uL (ref 140–400)
RBC: 4.25 10*6/uL (ref 3.80–5.10)
RDW: 13.7 % (ref 11.0–15.0)
Total Lymphocyte: 13.5 %
WBC: 6.6 10*3/uL (ref 3.8–10.8)

## 2022-08-13 MED ORDER — METOPROLOL TARTRATE 25 MG PO TABS: ORAL_TABLET | ORAL | 3 refills | Status: DC

## 2022-08-13 NOTE — Telephone Encounter (Signed)
Please inform patient the following information: Her hemoglobin and hematocrit are normal.  No signs of anemia Blood cell counts are normal, no signs of infection The lining of her uterus, called endometrium, is mildly thickened for postmenopausal female.  There were no other abnormalities visualized in her ultrasound.  I had placed a referral back urgently to the gynecology practice she has seen in the past.  She should be hearing from them, but she can also call them and let them know if there is an urgent referral so they can expedite her appointment.  She will require further work-up and endometrial biopsy by her gynecology team the next step.

## 2022-08-13 NOTE — Telephone Encounter (Signed)
Pt's medication was sent to pt's pharmacy as requested. Confirmation received.  °

## 2022-08-13 NOTE — Telephone Encounter (Signed)
Pt called and asked to speak with Lorriane Shire. I informed her that Lorriane Shire was gone for the day. However she wanted to speak with Lorriane Shire after her phone call this morning; she realized she has some additional questions.

## 2022-08-13 NOTE — Telephone Encounter (Signed)
Spoke with patient regarding results/recommendations.  

## 2022-08-14 DIAGNOSIS — N951 Menopausal and female climacteric states: Secondary | ICD-10-CM | POA: Diagnosis not present

## 2022-08-14 NOTE — Telephone Encounter (Signed)
Pt wanted to know if there was something that she could take to stop the bleeding. Pt has an appt scheduled with GYN on Nov 27 and is on a cancellation list. Pt states that she spoke with her holistic doctor and was told that it is not an effect of anything (hormonal) that she is taking.  Please advise

## 2022-08-14 NOTE — Telephone Encounter (Signed)
Unfortunately, there is nothing she can take that would stop vaginal bleeding. If vaginal bleeding worsens, I would encourage her to be seen in the emergency room or call her gynecology office for further advice.  They may have recommendations for her as well.

## 2022-08-14 NOTE — Telephone Encounter (Signed)
Patient requesting to speak to Dominica.  Patient states that she left message for her to call her, and she has not rec'd call yet.  Please call [phone]

## 2022-08-14 NOTE — Telephone Encounter (Signed)
Spoke with patient regarding results/recommendations.  

## 2022-08-17 ENCOUNTER — Other Ambulatory Visit: Payer: Medicare Other

## 2022-08-17 DIAGNOSIS — E039 Hypothyroidism, unspecified: Secondary | ICD-10-CM | POA: Diagnosis not present

## 2022-08-17 DIAGNOSIS — I480 Paroxysmal atrial fibrillation: Secondary | ICD-10-CM | POA: Diagnosis not present

## 2022-08-17 DIAGNOSIS — I5032 Chronic diastolic (congestive) heart failure: Secondary | ICD-10-CM | POA: Diagnosis not present

## 2022-08-17 DIAGNOSIS — M80061D Age-related osteoporosis with current pathological fracture, right lower leg, subsequent encounter for fracture with routine healing: Secondary | ICD-10-CM | POA: Diagnosis not present

## 2022-08-17 DIAGNOSIS — I110 Hypertensive heart disease with heart failure: Secondary | ICD-10-CM | POA: Diagnosis not present

## 2022-08-17 DIAGNOSIS — K633 Ulcer of intestine: Secondary | ICD-10-CM | POA: Diagnosis not present

## 2022-08-18 DIAGNOSIS — N951 Menopausal and female climacteric states: Secondary | ICD-10-CM | POA: Diagnosis not present

## 2022-08-18 NOTE — Telephone Encounter (Signed)
FYI  Patient wanted to let Dr. Raoul Pitch she was able to get in earlier to her GYN.  Patient was on wait list; appt became available.  [name] [name] is scheduled to see Dr. Princess Bruins on 08/20/22 at [place].  No return call needed.

## 2022-08-19 ENCOUNTER — Ambulatory Visit (INDEPENDENT_AMBULATORY_CARE_PROVIDER_SITE_OTHER): Payer: Medicare Other | Admitting: *Deleted

## 2022-08-19 DIAGNOSIS — I110 Hypertensive heart disease with heart failure: Secondary | ICD-10-CM | POA: Diagnosis not present

## 2022-08-19 DIAGNOSIS — Z952 Presence of prosthetic heart valve: Secondary | ICD-10-CM

## 2022-08-19 DIAGNOSIS — Z8679 Personal history of other diseases of the circulatory system: Secondary | ICD-10-CM

## 2022-08-19 DIAGNOSIS — Z5181 Encounter for therapeutic drug level monitoring: Secondary | ICD-10-CM

## 2022-08-19 DIAGNOSIS — I480 Paroxysmal atrial fibrillation: Secondary | ICD-10-CM | POA: Diagnosis not present

## 2022-08-19 DIAGNOSIS — E039 Hypothyroidism, unspecified: Secondary | ICD-10-CM | POA: Diagnosis not present

## 2022-08-19 DIAGNOSIS — I5032 Chronic diastolic (congestive) heart failure: Secondary | ICD-10-CM | POA: Diagnosis not present

## 2022-08-19 DIAGNOSIS — K633 Ulcer of intestine: Secondary | ICD-10-CM | POA: Diagnosis not present

## 2022-08-19 DIAGNOSIS — Z9889 Other specified postprocedural states: Secondary | ICD-10-CM | POA: Diagnosis not present

## 2022-08-19 DIAGNOSIS — M80061D Age-related osteoporosis with current pathological fracture, right lower leg, subsequent encounter for fracture with routine healing: Secondary | ICD-10-CM | POA: Diagnosis not present

## 2022-08-19 LAB — POCT INR: INR: 2 (ref 2.0–3.0)

## 2022-08-19 NOTE — Patient Instructions (Signed)
Description   Spoke with Mickel Baas, RN at [facility] and pt, advised to take 2 tablets today only then start taking 1 tablet daily EXCEPT 1.5 tablets on Tuesdays and Saturdays.  Remain consistent with greens.  Recheck INR in 1 week. Coumadin Clinic [phone].

## 2022-08-19 NOTE — Telephone Encounter (Signed)
Noted  

## 2022-08-20 ENCOUNTER — Ambulatory Visit (INDEPENDENT_AMBULATORY_CARE_PROVIDER_SITE_OTHER): Payer: Medicare Other | Admitting: Obstetrics & Gynecology

## 2022-08-20 ENCOUNTER — Encounter: Payer: Self-pay | Admitting: Obstetrics & Gynecology

## 2022-08-20 ENCOUNTER — Other Ambulatory Visit (HOSPITAL_COMMUNITY)
Admission: RE | Admit: 2022-08-20 | Discharge: 2022-08-20 | Disposition: A | Discharge status: Home / Self Care | Payer: Medicare Other | Source: Ambulatory Visit | Attending: Obstetrics & Gynecology | Admitting: Obstetrics & Gynecology

## 2022-08-20 VITALS — BP 118/76 | HR 70

## 2022-08-20 DIAGNOSIS — N950 Postmenopausal bleeding: Secondary | ICD-10-CM | POA: Insufficient documentation

## 2022-08-20 DIAGNOSIS — N858 Other specified noninflammatory disorders of uterus: Secondary | ICD-10-CM | POA: Diagnosis not present

## 2022-08-20 NOTE — Progress Notes (Signed)
    [name] [name] 01-29-1951 [id number]        71 y.o.  G2P0020   RP: PMB  HPI: Followed by Integrative medicine, on Progesterone 100 mg PO daily.  Taking Black Cohash supplements.  PMB started on 08/11/22 x 4 days with moderate red dark flow.  No vaginal bleeding anymore.  No pelvic pain.  Abstinent.  Urine/BMs normal.  H/O Breast Ca.  No adjuvant therapy x many years.  On Coumadin.   OB History  Gravida Para Term Preterm AB Living  2       2    SAB IAB Ectopic Multiple Live Births    2          # Outcome Date GA Lbr Len/2nd Weight Sex Delivery Anes PTL Lv  2 IAB           1 IAB             Past medical history,surgical history, problem list, medications, allergies, family history and social history were all reviewed and documented in the EPIC chart.   Directed ROS with pertinent positives and negatives documented in the history of present illness/assessment and plan.  Exam:  Vitals:   08/20/22 0951  BP: 118/76  Pulse: 70  SpO2: 97%   General appearance:  Normal  Pelvic US 08/12/22:  Uterus Measurements: 4.7 x 2.4 x 3.3 cm = volume: 19 mL. The uterus is anteverted. No fibroids or other mass visualized. Endometrium Thickness: 5 mm. The endometrium is mildly heterogeneous but no focal abnormality is visualized. Right ovary The right ovary was not visualized. Left ovary Measurements: 1.6 x 1.0 x 1.4 cm = volume: 1.2 mL. A simple cyst is seen within the left ovary measuring up to 8 mm. Pulsed Doppler evaluation of both ovaries demonstrates normal low-resistance arterial and venous waveforms. Trace free fluid is seen within the pelvis.  Written informed consent for EBx obtained.  EBx prodedure:  Vulva normal.  Cervix normal, vagina normal.  Betadine prep and  Hurricane spray of the cervix.  Tenaculum on anterior lip of the cervix.  Os finder.  Endometrial Bx with Pipette using negative pressure on all endometrial surfaces.  Good specimen obtained, sent to pathology.   Well tolerated, no Cx.  Post procedure precautions.    Assessment/Plan:  71 y.o. G2P0020   1. Postmenopausal bleeding Followed by Integrative medicine, on Progesterone 100 mg PO daily.  Taking Black Cohash supplements.  PMB started on 08/11/22 x 4 days with moderate red dark flow.  No vaginal bleeding anymore.  No pelvic pain.  Abstinent.  Urine/BMs normal.  H/O Breast Ca.  No adjuvant therapy x many years.  On Coumadin.  Pelvic US findings reviewed with patient.  Successful EBx after written informed consent.  No Cx.  Post procedure precautions.  Management per EBx results.  Strongly recommend to stop Black Cohash supplements and Progesterone 100 mg caps. - Surgical pathology( [facility]/ [facility])   Princess Bruins MD, 10:02 AM 08/20/2022

## 2022-08-24 DIAGNOSIS — M80061D Age-related osteoporosis with current pathological fracture, right lower leg, subsequent encounter for fracture with routine healing: Secondary | ICD-10-CM | POA: Diagnosis not present

## 2022-08-24 DIAGNOSIS — E039 Hypothyroidism, unspecified: Secondary | ICD-10-CM | POA: Diagnosis not present

## 2022-08-24 DIAGNOSIS — K633 Ulcer of intestine: Secondary | ICD-10-CM | POA: Diagnosis not present

## 2022-08-24 DIAGNOSIS — I5032 Chronic diastolic (congestive) heart failure: Secondary | ICD-10-CM | POA: Diagnosis not present

## 2022-08-24 DIAGNOSIS — I480 Paroxysmal atrial fibrillation: Secondary | ICD-10-CM | POA: Diagnosis not present

## 2022-08-24 DIAGNOSIS — I110 Hypertensive heart disease with heart failure: Secondary | ICD-10-CM | POA: Diagnosis not present

## 2022-08-24 LAB — SURGICAL PATHOLOGY

## 2022-08-26 ENCOUNTER — Ambulatory Visit (INDEPENDENT_AMBULATORY_CARE_PROVIDER_SITE_OTHER): Payer: Medicare Other | Admitting: *Deleted

## 2022-08-26 DIAGNOSIS — M80061D Age-related osteoporosis with current pathological fracture, right lower leg, subsequent encounter for fracture with routine healing: Secondary | ICD-10-CM | POA: Diagnosis not present

## 2022-08-26 DIAGNOSIS — Z952 Presence of prosthetic heart valve: Secondary | ICD-10-CM

## 2022-08-26 DIAGNOSIS — Z9889 Other specified postprocedural states: Secondary | ICD-10-CM

## 2022-08-26 DIAGNOSIS — I5032 Chronic diastolic (congestive) heart failure: Secondary | ICD-10-CM | POA: Diagnosis not present

## 2022-08-26 DIAGNOSIS — Z5181 Encounter for therapeutic drug level monitoring: Secondary | ICD-10-CM | POA: Diagnosis not present

## 2022-08-26 DIAGNOSIS — K633 Ulcer of intestine: Secondary | ICD-10-CM | POA: Diagnosis not present

## 2022-08-26 DIAGNOSIS — Z8679 Personal history of other diseases of the circulatory system: Secondary | ICD-10-CM

## 2022-08-26 DIAGNOSIS — I110 Hypertensive heart disease with heart failure: Secondary | ICD-10-CM | POA: Diagnosis not present

## 2022-08-26 DIAGNOSIS — E039 Hypothyroidism, unspecified: Secondary | ICD-10-CM | POA: Diagnosis not present

## 2022-08-26 DIAGNOSIS — I480 Paroxysmal atrial fibrillation: Secondary | ICD-10-CM | POA: Diagnosis not present

## 2022-08-26 LAB — POCT INR: INR: 2.5 (ref 2.0–3.0)

## 2022-08-26 NOTE — Patient Instructions (Signed)
Description   Spoke with Mickel Baas, RN at [facility] and pt, advised to continue taking 1 tablet daily EXCEPT 1.5 tablets on Tuesdays and Saturdays. Remain consistent with greens. Recheck INR in 1 week. Coumadin Clinic [phone].

## 2022-08-31 DIAGNOSIS — M80061D Age-related osteoporosis with current pathological fracture, right lower leg, subsequent encounter for fracture with routine healing: Secondary | ICD-10-CM | POA: Diagnosis not present

## 2022-08-31 DIAGNOSIS — I5032 Chronic diastolic (congestive) heart failure: Secondary | ICD-10-CM | POA: Diagnosis not present

## 2022-08-31 DIAGNOSIS — K633 Ulcer of intestine: Secondary | ICD-10-CM | POA: Diagnosis not present

## 2022-08-31 DIAGNOSIS — E039 Hypothyroidism, unspecified: Secondary | ICD-10-CM | POA: Diagnosis not present

## 2022-08-31 DIAGNOSIS — I110 Hypertensive heart disease with heart failure: Secondary | ICD-10-CM | POA: Diagnosis not present

## 2022-08-31 DIAGNOSIS — I480 Paroxysmal atrial fibrillation: Secondary | ICD-10-CM | POA: Diagnosis not present

## 2022-09-02 ENCOUNTER — Ambulatory Visit (INDEPENDENT_AMBULATORY_CARE_PROVIDER_SITE_OTHER): Payer: Medicare Other

## 2022-09-02 DIAGNOSIS — K633 Ulcer of intestine: Secondary | ICD-10-CM | POA: Diagnosis not present

## 2022-09-02 DIAGNOSIS — Z8679 Personal history of other diseases of the circulatory system: Secondary | ICD-10-CM | POA: Diagnosis not present

## 2022-09-02 DIAGNOSIS — Z952 Presence of prosthetic heart valve: Secondary | ICD-10-CM

## 2022-09-02 DIAGNOSIS — Z5181 Encounter for therapeutic drug level monitoring: Secondary | ICD-10-CM

## 2022-09-02 DIAGNOSIS — I110 Hypertensive heart disease with heart failure: Secondary | ICD-10-CM | POA: Diagnosis not present

## 2022-09-02 DIAGNOSIS — Z9889 Other specified postprocedural states: Secondary | ICD-10-CM | POA: Diagnosis not present

## 2022-09-02 DIAGNOSIS — I5032 Chronic diastolic (congestive) heart failure: Secondary | ICD-10-CM | POA: Diagnosis not present

## 2022-09-02 DIAGNOSIS — E039 Hypothyroidism, unspecified: Secondary | ICD-10-CM | POA: Diagnosis not present

## 2022-09-02 DIAGNOSIS — I480 Paroxysmal atrial fibrillation: Secondary | ICD-10-CM | POA: Diagnosis not present

## 2022-09-02 DIAGNOSIS — M80061D Age-related osteoporosis with current pathological fracture, right lower leg, subsequent encounter for fracture with routine healing: Secondary | ICD-10-CM | POA: Diagnosis not present

## 2022-09-02 LAB — POCT INR: INR: 2.5 (ref 2.0–3.0)

## 2022-09-02 NOTE — Patient Instructions (Signed)
Description   Spoke with Donita, RN with [facility] and pt, advised to take 1.5 tablets today and then continue taking 1 tablet daily EXCEPT 1.5 tablets on Tuesdays and Saturdays.  Remain consistent with greens.  Recheck INR in 2 weeks.  Coumadin Clinic [phone]

## 2022-09-05 DIAGNOSIS — N951 Menopausal and female climacteric states: Secondary | ICD-10-CM | POA: Diagnosis not present

## 2022-09-08 DIAGNOSIS — I5032 Chronic diastolic (congestive) heart failure: Secondary | ICD-10-CM | POA: Diagnosis not present

## 2022-09-08 DIAGNOSIS — E039 Hypothyroidism, unspecified: Secondary | ICD-10-CM | POA: Diagnosis not present

## 2022-09-08 DIAGNOSIS — M80061D Age-related osteoporosis with current pathological fracture, right lower leg, subsequent encounter for fracture with routine healing: Secondary | ICD-10-CM | POA: Diagnosis not present

## 2022-09-08 DIAGNOSIS — K633 Ulcer of intestine: Secondary | ICD-10-CM | POA: Diagnosis not present

## 2022-09-08 DIAGNOSIS — I110 Hypertensive heart disease with heart failure: Secondary | ICD-10-CM | POA: Diagnosis not present

## 2022-09-08 DIAGNOSIS — I480 Paroxysmal atrial fibrillation: Secondary | ICD-10-CM | POA: Diagnosis not present

## 2022-09-09 DIAGNOSIS — E039 Hypothyroidism, unspecified: Secondary | ICD-10-CM | POA: Diagnosis not present

## 2022-09-09 DIAGNOSIS — I5032 Chronic diastolic (congestive) heart failure: Secondary | ICD-10-CM | POA: Diagnosis not present

## 2022-09-09 DIAGNOSIS — M80061D Age-related osteoporosis with current pathological fracture, right lower leg, subsequent encounter for fracture with routine healing: Secondary | ICD-10-CM | POA: Diagnosis not present

## 2022-09-09 DIAGNOSIS — I480 Paroxysmal atrial fibrillation: Secondary | ICD-10-CM | POA: Diagnosis not present

## 2022-09-09 DIAGNOSIS — K633 Ulcer of intestine: Secondary | ICD-10-CM | POA: Diagnosis not present

## 2022-09-09 DIAGNOSIS — I110 Hypertensive heart disease with heart failure: Secondary | ICD-10-CM | POA: Diagnosis not present

## 2022-09-14 ENCOUNTER — Ambulatory Visit: Payer: Medicare Other | Admitting: Obstetrics & Gynecology

## 2022-09-16 ENCOUNTER — Ambulatory Visit: Payer: Medicare Other | Attending: Cardiology | Admitting: *Deleted

## 2022-09-16 DIAGNOSIS — Z952 Presence of prosthetic heart valve: Secondary | ICD-10-CM

## 2022-09-16 DIAGNOSIS — Z5181 Encounter for therapeutic drug level monitoring: Secondary | ICD-10-CM

## 2022-09-16 DIAGNOSIS — I5032 Chronic diastolic (congestive) heart failure: Secondary | ICD-10-CM

## 2022-09-16 DIAGNOSIS — Z8679 Personal history of other diseases of the circulatory system: Secondary | ICD-10-CM | POA: Diagnosis not present

## 2022-09-16 DIAGNOSIS — Z9889 Other specified postprocedural states: Secondary | ICD-10-CM

## 2022-09-16 LAB — POCT INR: INR: 1.9 — AB (ref 2.0–3.0)

## 2022-09-16 NOTE — Patient Instructions (Signed)
Description   Today take 1.5 tablets of warfarin then start taking warfarin 1 tablet daily EXCEPT 1.5 tablets on Tuesdays, Thursdays, and Saturdays. Remain consistent with greens. Recheck INR in 2 weeks.  Coumadin Clinic [phone] or [phone]

## 2022-09-22 DIAGNOSIS — E039 Hypothyroidism, unspecified: Secondary | ICD-10-CM | POA: Diagnosis not present

## 2022-09-22 DIAGNOSIS — G35 Multiple sclerosis: Secondary | ICD-10-CM | POA: Diagnosis not present

## 2022-09-22 DIAGNOSIS — E559 Vitamin D deficiency, unspecified: Secondary | ICD-10-CM | POA: Diagnosis not present

## 2022-09-22 DIAGNOSIS — R7989 Other specified abnormal findings of blood chemistry: Secondary | ICD-10-CM | POA: Diagnosis not present

## 2022-09-22 DIAGNOSIS — N951 Menopausal and female climacteric states: Secondary | ICD-10-CM | POA: Diagnosis not present

## 2022-09-28 DIAGNOSIS — N951 Menopausal and female climacteric states: Secondary | ICD-10-CM | POA: Diagnosis not present

## 2022-09-30 ENCOUNTER — Ambulatory Visit: Payer: Medicare Other

## 2022-09-30 DIAGNOSIS — N951 Menopausal and female climacteric states: Secondary | ICD-10-CM | POA: Diagnosis not present

## 2022-10-01 DIAGNOSIS — N951 Menopausal and female climacteric states: Secondary | ICD-10-CM | POA: Diagnosis not present

## 2022-10-02 ENCOUNTER — Ambulatory Visit: Payer: Medicare Other | Attending: Cardiovascular Disease

## 2022-10-02 DIAGNOSIS — Z5181 Encounter for therapeutic drug level monitoring: Secondary | ICD-10-CM

## 2022-10-02 DIAGNOSIS — Z952 Presence of prosthetic heart valve: Secondary | ICD-10-CM | POA: Diagnosis not present

## 2022-10-02 DIAGNOSIS — Z9889 Other specified postprocedural states: Secondary | ICD-10-CM

## 2022-10-02 DIAGNOSIS — Z8679 Personal history of other diseases of the circulatory system: Secondary | ICD-10-CM | POA: Diagnosis not present

## 2022-10-02 LAB — POCT INR: INR: 2.2 (ref 2.0–3.0)

## 2022-10-02 NOTE — Patient Instructions (Signed)
Today take 1.5 tablets of warfarin then start taking warfarin 1 tablet daily EXCEPT 1.5 tablets on Tuesdays, Thursdays, and Saturdays. Remain consistent with greens. Recheck INR in 3 weeks.  Coumadin Clinic [phone] or [phone]

## 2022-10-21 IMAGING — CT CT HEAD CODE STROKE
3 of 4 series · 13 of 47 positions shown, 15 images · IV contrast (omnipaque)
Comparison: None.

CLINICAL DATA: Code stroke

EXAM:
CT HEAD WITHOUT CONTRAST
CT ANGIOGRAPHY OF THE HEAD AND NECK
TECHNIQUE: Contiguous axial images were obtained from the base of the skull
through the vertex without intravenous contrast. Multidetector CT
imaging of the head and neck was performed using the standard
protocol during bolus administration of intravenous contrast.
Multiplanar CT image reconstructions and MIPs were obtained to
evaluate the vascular anatomy. Carotid stenosis measurements (when
applicable) are obtained utilizing NASCET criteria, using the distal
internal carotid diameter as the denominator.
CONTRAST:  80mL OMNIPAQUE IOHEXOL 350 MG/ML SOLN

[Series 3: head wo · axial · 0.47mm/px · z∈[-122,+8]mm · 7 of 36 slices shown, 9 images]
[im 5/36  brain]
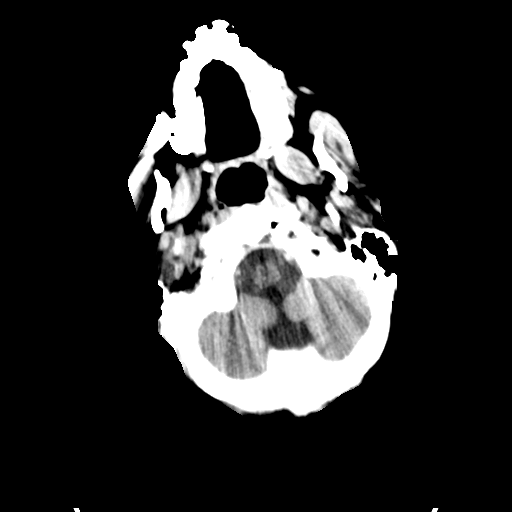
[im 5/36  bone]
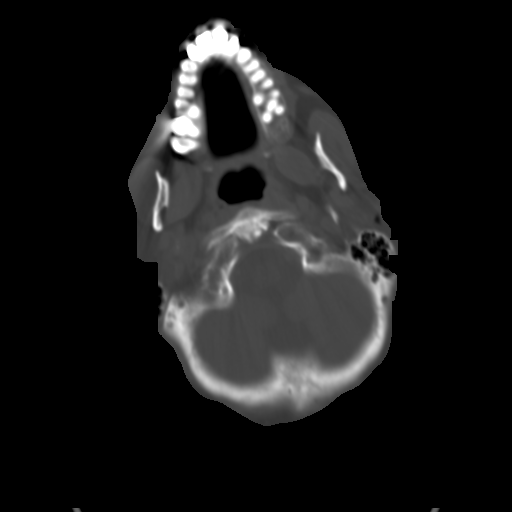
[im 9/36  brain]
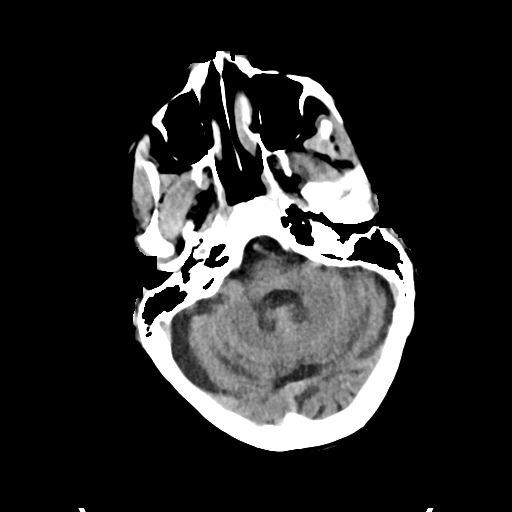
[im 14/36  brain]
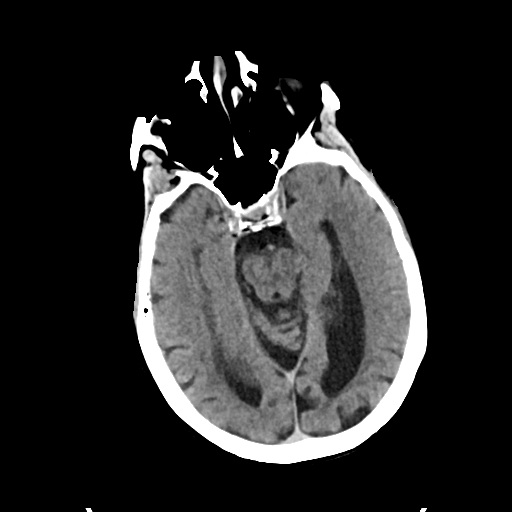
[im 18/36  brain]
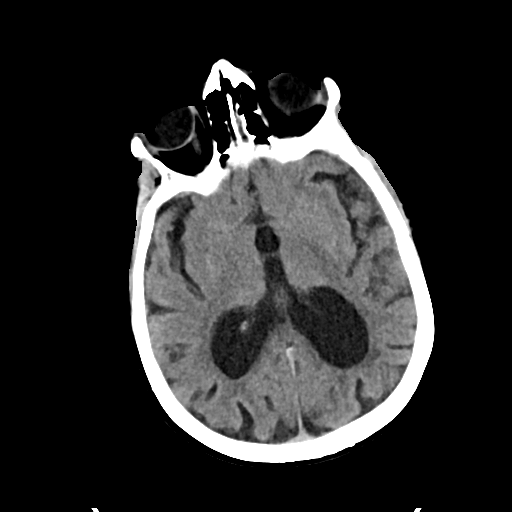
[im 22/36  brain]
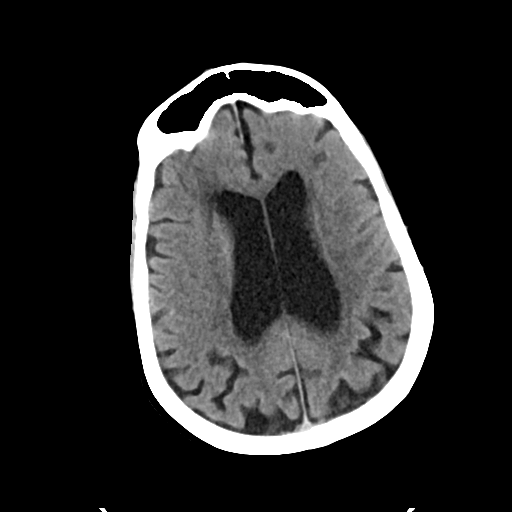
[im 22/36  bone]
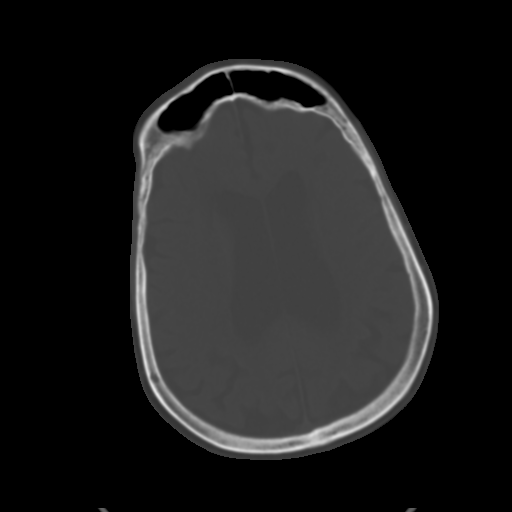
[im 27/36  brain]
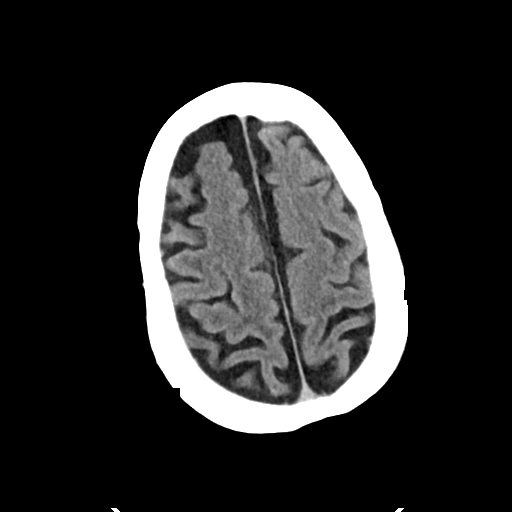
[im 31/36  brain]
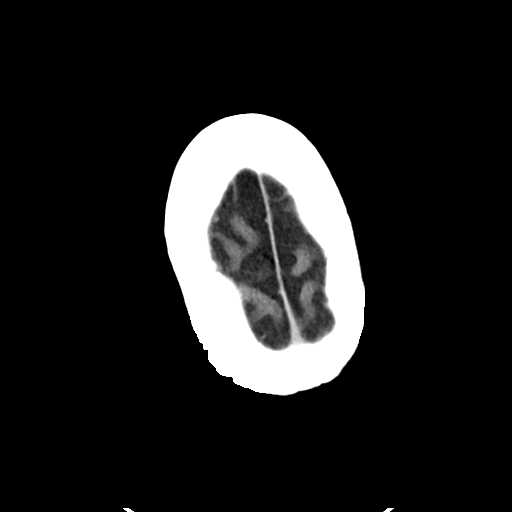

[Series 5: cor soft · coronal · 0.31mm/px · 3 of 70 slices shown]
[im 24/70  brain]
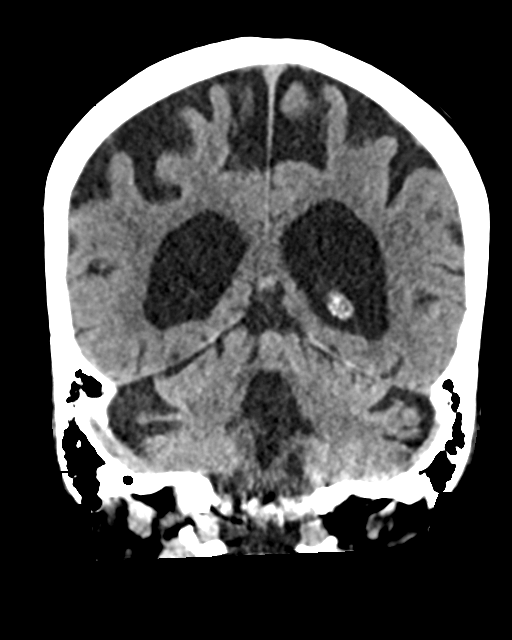
[im 31/70  brain]
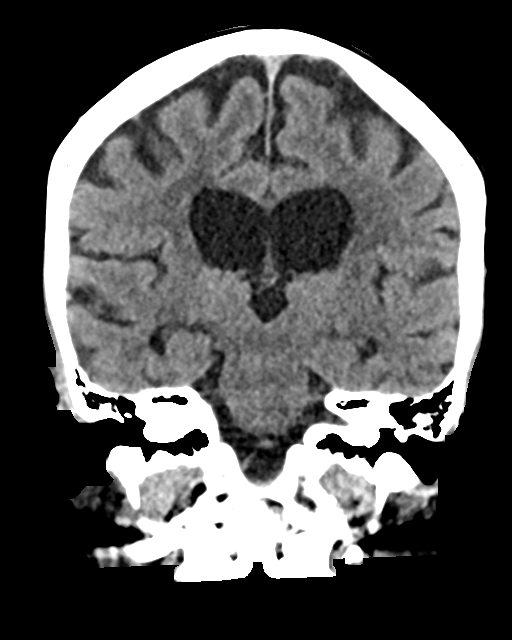
[im 39/70  brain]
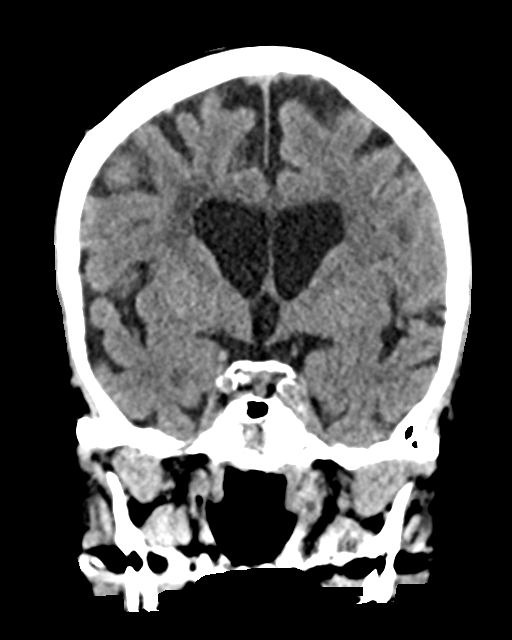

[Series 6: sag soft · sagittal · 0.39mm/px · 3 of 54 slices shown]
[im 18/54  brain]
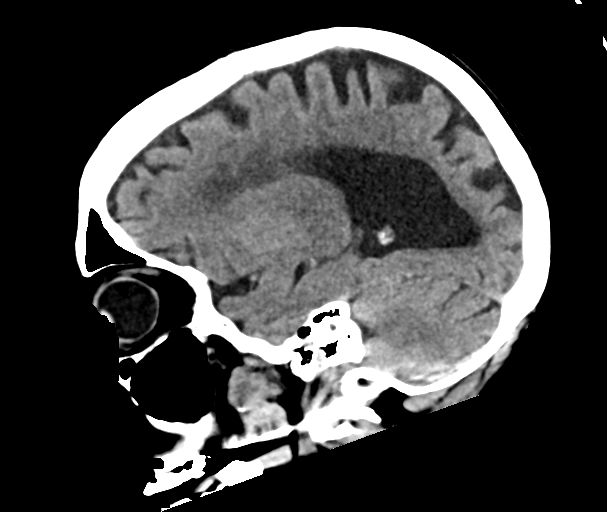
[im 27/54  brain]
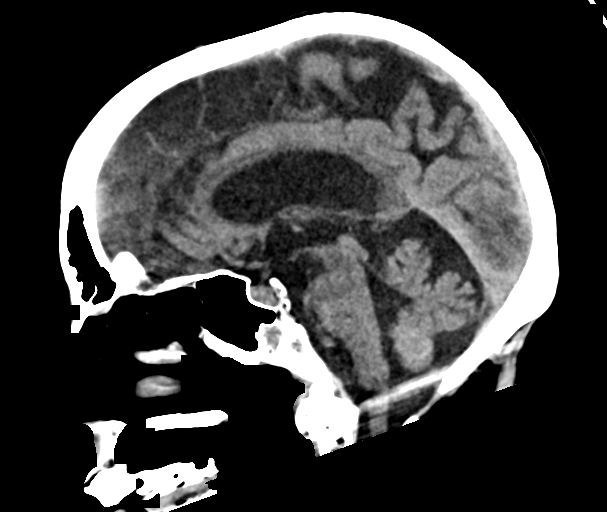
[im 36/54  brain]
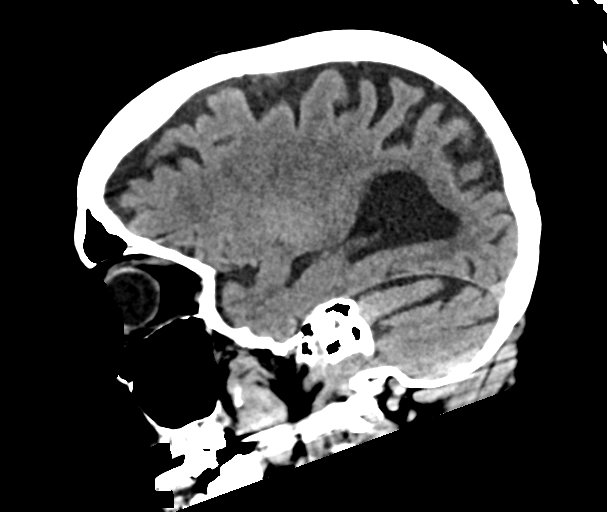

[13 of 47 positions shown; findings below may reference images not displayed]

FINDINGS: CT HEAD

Brain: No acute intracranial hemorrhage, mass effect, or edema.
Gray-white differentiation is preserved. Patchy hypoattenuation in
the supratentorial white matter is nonspecific but probably reflects
chronic microvascular ischemic changes. Prominence of the ventricles
and sulci reflects generalized parenchymal volume loss. No
extra-axial collection.

Vascular: No hyperdense vessel.

Skull: Unremarkable.

Sinuses/Orbits: No acute abnormality.

Other: Mastoid air cells are clear.

ASPECTS (Alberta Stroke Program Early CT Score)

- Ganglionic level infarction (caudate, lentiform nuclei, internal
capsule, insula, M1-M3 cortex): 7

- Supraganglionic infarction (M4-M6 cortex): 3

Total score (0-10 with 10 being normal): 10

CTA NECK

Aortic arch: Great vessel origins are patent. There is mixed plaque
along the innominate extending into the subclavian origin with less
than 50% stenosis.

Right carotid system: Patent. Atherosclerotic wall thickening with
eccentric plaque along the proximal common carotid causing less than
50% stenosis. Minimal calcified plaque at the bifurcation. No
stenosis at the ICA origin.

Left carotid system: Patent. Mild calcified plaque at the ICA origin
without stenosis.

Vertebral arteries:Left vertebral artery is patent. Right V1
vertebral artery is not opacified. There is faint enhancement of the
right V2 and V3 segments.

Skeleton: Degenerative changes of the cervical spine.

Other neck: Scarring at the right lung apex with traction
bronchiectasis.

CTA HEAD

Anterior circulation: Intracranial internal carotid arteries are
patent with minimal calcified plaque. Anterior and middle cerebral
arteries are patent.

Posterior circulation: Intracranial vertebral arteries are patent.
Improved opacification of the right vertebral artery beyond the PICA
origin probably related to retrograde flow. Basilar artery is
patent. Posterior cerebral arteries are patent. Bilateral posterior
communicating arteries are present, right larger than left.

Venous sinuses: Not well evaluated.
IMPRESSION: There is no acute intracranial hemorrhage or evidence of acute
infarction. ASPECT score is 10.

Age-indeterminate but probably chronic high-grade stenosis/occlusion
of the proximal right vertebral artery with diminished enhancement
in the neck. Patent intracranially with greater flow beyond PICA
origin likely from retrograde flow.

Otherwise, no hemodynamically significant stenosis.

Initial results were communicated to Dr. Toral at [DATE] p.m. on
01/11/2021 by text page via the [employer] messaging system.

## 2022-10-23 ENCOUNTER — Ambulatory Visit: Payer: Medicare Other | Attending: Cardiology

## 2022-10-23 DIAGNOSIS — Z5181 Encounter for therapeutic drug level monitoring: Secondary | ICD-10-CM | POA: Diagnosis not present

## 2022-10-23 DIAGNOSIS — Z952 Presence of prosthetic heart valve: Secondary | ICD-10-CM

## 2022-10-23 DIAGNOSIS — Z8679 Personal history of other diseases of the circulatory system: Secondary | ICD-10-CM | POA: Diagnosis not present

## 2022-10-23 DIAGNOSIS — Z9889 Other specified postprocedural states: Secondary | ICD-10-CM | POA: Diagnosis not present

## 2022-10-23 LAB — POCT INR: INR: 3.5 — AB (ref 2.0–3.0)

## 2022-10-23 NOTE — Patient Instructions (Signed)
Continue 1 tablet daily EXCEPT 1.5 tablets on Tuesdays, Thursdays, and Saturdays. Remain consistent with greens. Recheck INR in 5 weeks.  Coumadin Clinic [phone] or [phone]

## 2022-11-01 DIAGNOSIS — N951 Menopausal and female climacteric states: Secondary | ICD-10-CM | POA: Diagnosis not present

## 2022-11-01 DIAGNOSIS — R5383 Other fatigue: Secondary | ICD-10-CM | POA: Diagnosis not present

## 2022-11-02 ENCOUNTER — Other Ambulatory Visit: Payer: Self-pay

## 2022-11-02 MED ORDER — WARFARIN SODIUM 5 MG PO TABS: ORAL_TABLET | ORAL | 1 refills | Status: DC

## 2022-11-16 ENCOUNTER — Encounter: Payer: Self-pay | Admitting: Podiatry

## 2022-11-16 ENCOUNTER — Ambulatory Visit (INDEPENDENT_AMBULATORY_CARE_PROVIDER_SITE_OTHER): Payer: Medicare Other | Admitting: Podiatry

## 2022-11-16 VITALS — BP 142/68 | HR 69

## 2022-11-16 DIAGNOSIS — M79676 Pain in unspecified toe(s): Secondary | ICD-10-CM

## 2022-11-16 DIAGNOSIS — L84 Corns and callosities: Secondary | ICD-10-CM | POA: Diagnosis not present

## 2022-11-16 DIAGNOSIS — B351 Tinea unguium: Secondary | ICD-10-CM

## 2022-11-16 DIAGNOSIS — L309 Dermatitis, unspecified: Secondary | ICD-10-CM

## 2022-11-16 DIAGNOSIS — I739 Peripheral vascular disease, unspecified: Secondary | ICD-10-CM | POA: Diagnosis not present

## 2022-11-16 MED ORDER — TRIAMCINOLONE ACETONIDE 0.1 % EX CREA: 1.0000 | TOPICAL_CREAM | Freq: Two times a day (BID) | CUTANEOUS | 0 refills | Status: AC

## 2022-11-16 NOTE — Progress Notes (Signed)
Subjective: [name] [name] is a 72 y.o. female patient seen today in office with complaint of mildly painful thickened and elongated toenails and callus to toes; unable to trim.  Patient denies any other pedal complaints at this time. History of PVD   Patient Active Problem List   Diagnosis Date Noted   Pyuria 01/21/2022   Hematuria 01/21/2022   GIB (gastrointestinal bleeding) 01/20/2022   Chronic heart failure with preserved ejection fraction (HFpEF) ([place]) 01/20/2022   Rectal bleeding    Fecal impaction ([place])    Malnutrition of moderate degree 01/03/2022   Normocytic anemia 01/02/2022   Tibial plateau fracture, right 01/01/2022   Vertigo    Elevated BUN    Prolonged Q-T interval on ECG    Chronic obstructive pulmonary disease (HCC)    Premature ventricular contractions    Slow transit constipation    Torticollis    Essential hypertension    Acute lower UTI    Brainstem infarct, acute ([place]) 01/13/2021   Left pontine cerebrovascular accident ([place]) 01/13/2021   CVA (cerebral vascular accident) ([place]) 01/11/2021   Right sided weakness    PVCs (premature ventricular contractions) 03/30/2020   Osteoporosis 07/05/2019   Nonischemic cardiomyopathy ([place]) 07/20/2018   Scoliosis 11/20/2016   Compression fracture of lumbar spine, non-traumatic, sequela 08/11/2016   At high risk for injury related to fall 08/11/2016   Encounter for therapeutic drug monitoring 11/27/2013   Hypothyroidism    CHF (congestive heart failure) (HCC)    Paroxysmal atrial fibrillation (HCC)    Arthritis    Neurogenic bladder    Warfarin anticoagulation    First degree heart block 01/29/2012   S/P mitral valve replacement 01/26/2012   S/P Maze operation for atrial fibrillation 01/26/2012   Anxiety 01/18/2012   Multiple sclerosis ([place]) 01/19/2007    Current Outpatient Medications on File Prior to Visit  Medication Sig Dispense Refill   Alpha-Lipoic Acid 100 MG CAPS Take by mouth 3 (three) times daily.      Biotin 10 MG CAPS Take 1 capsule by mouth 2 (two) times daily.     calcium carbonate (OS-CAL - DOSED IN MG OF ELEMENTAL CALCIUM) 1250 (500 Ca) MG tablet Take 1 tablet by mouth. Taking 1,500 mg by mouth every morning     Cholecalciferol (VITAMIN D3) 50 MCG (2000 UT) TABS Take 2,000-4,000 Units by mouth See admin instructions. Take one tablet (2000 units) by mouth every other day and take 2 tablets (4000 units) every other day     Coenzyme Q10 (CO Q-10) 100 MG CAPS Take 100 mg by mouth every morning.     Cranberry 500 MG CAPS Take 500 mg by mouth 2 (two) times daily with a meal.     Cyanocobalamin (VITAMIN B-12) 1000 MCG SUBL Place 1,000 mcg under the tongue every morning.     fluticasone (FLONASE) 50 MCG/ACT nasal spray Place 1 spray into both nostrils daily. (Patient taking differently: Place 1 spray into both nostrils every morning.) 16 g 6   glucosamine-chondroitin 500-400 MG tablet Take 1 tablet by mouth 2 (two) times daily with a meal.     lisinopril (ZESTRIL) 5 MG tablet Take 1 tablet (5 mg total) by mouth daily. 90 tablet 3   Lysine 500 MG CAPS Take 1,000 mg by mouth 2 (two) times daily.     Magnesium Citrate 100 MG TABS Take 200 mg by mouth every morning.     metoprolol tartrate (LOPRESSOR) 25 MG tablet TAKE 1 TAB (25 MG) PO TWICE DAILY  180 tablet 3   Nutritional Supplements (BOOST VHC) LIQD Take 120 mLs by mouth in the morning and at bedtime.     Omega-3 Fatty Acids (FISH OIL) 1200 MG CAPS Take 1,200 mg by mouth 2 (two) times daily with a meal.     progesterone (PROMETRIUM) 100 MG capsule Take 100 mg by mouth every morning.     thyroid (ARMOUR) 90 MG tablet Take 90 mg by mouth every morning.     Turmeric 500 MG CAPS Take 500 mg by mouth every evening.     vitamin A 10000 UNIT capsule Take 10,000 Units by mouth every evening.     vitamin E 400 UNIT capsule Take 400 Units by mouth every evening.     warfarin (COUMADIN) 5 MG tablet Take 1 tablet by mouth daily except 1.5 tablets on Sundays,  Tuesdays, and Thursdays or as directed Anticoagulation Clinic. 120 tablet 1   No current facility-administered medications on file prior to visit.    Allergies  Allergen Reactions   Erythromycin Nausea And Vomiting   Atorvastatin Other (See Comments)    Pt reports causes body to be stiff, joints ache, aching in knees and ankles, feels more fatigued.    Lactose Intolerance (Gi) Other (See Comments)    Upset stomach - yoghurt   Valsartan Other (See Comments)    Pt reports caused her depression    Objective: Physical Exam  General: Well developed, nourished, no acute distress, awake, alert and oriented x 3  Vascular: Dorsalis pedis artery 1/4 bilateral, Posterior tibial artery 0/4 bilateral, skin temperature warm to warm proximal to distal bilateral lower extremities, moderate varicosities, no pedal hair present bilateral.  Trace edema noted bilateral.  Neurological: Gross sensation present via light touch bilateral.   Dermatological: Skin is warm, dry, and supple bilateral, Nails 1-10 are tender, long, thick, and discolored with mild subungal debris with first toes most involved bilateral, no webspace macerations present bilateral, no open lesions present bilateral, + callus/corns/hyperkeratotic tissue present bilateral 3rd toes and left fourth toe. No signs of infection bilateral. Erythema and scalling noted to dorsum of left hallux.   Musculoskeletal: Asymptomatic hammertoe boney deformities and fat pad atrophy noted bilateral. Muscular strength within normal limits without painon range of motion. No pain with calf compression bilateral.  Assessment and Plan:  Problem List Items Addressed This Visit   None Visit Diagnoses     Pain due to onychomycosis of toenail    -  Primary   PVD (peripheral vascular disease) ([place])       Corn of toe       Dermatitis           -Examined patient.  -Discussed treatment options for painful mycotic nails and corns. -Mechanically debrided and  reduced mycotic nails with sterile nail nipper and dremel nail file without incident. -Mechanically debrided corns bilateral third toes using a chisel blade without incident -Triamcinalone sent to pharmacy to help with some possible dermatitis on left hallux  -Advised good supportive shoes and sizing up to avoid rubbing and hammertoes -Return in 3 months for routine foot care or sooner if problems or issues arise  Lorenda Peck, DPM

## 2022-11-27 ENCOUNTER — Ambulatory Visit: Payer: Medicare Other

## 2022-12-08 ENCOUNTER — Ambulatory Visit: Payer: Medicare Other

## 2022-12-15 ENCOUNTER — Ambulatory Visit: Payer: Medicare Other | Attending: Cardiovascular Disease | Admitting: *Deleted

## 2022-12-15 DIAGNOSIS — Z8679 Personal history of other diseases of the circulatory system: Secondary | ICD-10-CM

## 2022-12-15 DIAGNOSIS — Z9889 Other specified postprocedural states: Secondary | ICD-10-CM | POA: Diagnosis not present

## 2022-12-15 DIAGNOSIS — Z5181 Encounter for therapeutic drug level monitoring: Secondary | ICD-10-CM | POA: Diagnosis not present

## 2022-12-15 DIAGNOSIS — Z952 Presence of prosthetic heart valve: Secondary | ICD-10-CM | POA: Diagnosis not present

## 2022-12-15 LAB — POCT INR: POC INR: 5.2

## 2022-12-15 NOTE — Patient Instructions (Addendum)
Description   Hold warfarin today and tomorrow Then START taking warfarin 1 tablet daily except for 1.5 tablets on Thursday and Saturday. Recheck INR in 2 weeks.  [facility] Clinic [phone] or [phone]

## 2022-12-16 DIAGNOSIS — N951 Menopausal and female climacteric states: Secondary | ICD-10-CM | POA: Diagnosis not present

## 2022-12-29 ENCOUNTER — Ambulatory Visit: Payer: Medicare Other | Attending: Cardiovascular Disease | Admitting: *Deleted

## 2022-12-29 DIAGNOSIS — Z9889 Other specified postprocedural states: Secondary | ICD-10-CM | POA: Diagnosis not present

## 2022-12-29 DIAGNOSIS — Z8679 Personal history of other diseases of the circulatory system: Secondary | ICD-10-CM | POA: Insufficient documentation

## 2022-12-29 DIAGNOSIS — Z952 Presence of prosthetic heart valve: Secondary | ICD-10-CM | POA: Diagnosis not present

## 2022-12-29 DIAGNOSIS — Z5181 Encounter for therapeutic drug level monitoring: Secondary | ICD-10-CM

## 2022-12-29 LAB — POCT INR: INR: 2.8 (ref 2.0–3.0)

## 2022-12-29 NOTE — Patient Instructions (Signed)
Description   Continue taking warfarin 1 tablet daily except for 1.5 tablets on Thursday and Saturday. Recheck INR in 3 weeks. [facility] Clinic [phone] or [phone]

## 2023-01-06 DIAGNOSIS — N951 Menopausal and female climacteric states: Secondary | ICD-10-CM | POA: Diagnosis not present

## 2023-01-21 ENCOUNTER — Ambulatory Visit: Payer: Medicare Other

## 2023-01-26 ENCOUNTER — Ambulatory Visit: Payer: Medicare Other

## 2023-01-29 ENCOUNTER — Ambulatory Visit: Payer: Medicare Other | Attending: Cardiovascular Disease | Admitting: *Deleted

## 2023-01-29 DIAGNOSIS — Z952 Presence of prosthetic heart valve: Secondary | ICD-10-CM

## 2023-01-29 DIAGNOSIS — Z8679 Personal history of other diseases of the circulatory system: Secondary | ICD-10-CM | POA: Diagnosis not present

## 2023-01-29 DIAGNOSIS — Z5181 Encounter for therapeutic drug level monitoring: Secondary | ICD-10-CM | POA: Diagnosis not present

## 2023-01-29 DIAGNOSIS — Z9889 Other specified postprocedural states: Secondary | ICD-10-CM

## 2023-01-29 LAB — POCT INR: INR: 2.8 (ref 2.0–3.0)

## 2023-01-29 NOTE — Patient Instructions (Signed)
Description   Continue taking warfarin 1 tablet daily except for 1.5 tablets on Thursday and Saturday. Recheck INR in 4 weeks. [facility]in Clinic [phone] or [phone]

## 2023-02-23 ENCOUNTER — Ambulatory Visit: Payer: Medicare Other | Attending: Cardiology | Admitting: *Deleted

## 2023-02-23 DIAGNOSIS — Z9889 Other specified postprocedural states: Secondary | ICD-10-CM | POA: Insufficient documentation

## 2023-02-23 DIAGNOSIS — Z952 Presence of prosthetic heart valve: Secondary | ICD-10-CM | POA: Insufficient documentation

## 2023-02-23 DIAGNOSIS — Z8679 Personal history of other diseases of the circulatory system: Secondary | ICD-10-CM | POA: Insufficient documentation

## 2023-02-23 DIAGNOSIS — Z5181 Encounter for therapeutic drug level monitoring: Secondary | ICD-10-CM | POA: Insufficient documentation

## 2023-02-23 LAB — POCT INR: INR: 3.2 — AB (ref 2.0–3.0)

## 2023-02-23 NOTE — Patient Instructions (Signed)
Description   Continue taking warfarin 1 tablet daily except for 1.5 tablets on Thursday and Saturday. Recheck INR in 5 weeks. [facility]in Clinic [phone] or [phone]

## 2023-03-05 ENCOUNTER — Other Ambulatory Visit: Payer: Medicare Other

## 2023-03-17 ENCOUNTER — Ambulatory Visit (INDEPENDENT_AMBULATORY_CARE_PROVIDER_SITE_OTHER): Payer: Medicare Other | Admitting: Podiatry

## 2023-03-17 ENCOUNTER — Encounter: Payer: Self-pay | Admitting: Podiatry

## 2023-03-17 DIAGNOSIS — I739 Peripheral vascular disease, unspecified: Secondary | ICD-10-CM

## 2023-03-17 DIAGNOSIS — M79676 Pain in unspecified toe(s): Secondary | ICD-10-CM | POA: Diagnosis not present

## 2023-03-17 DIAGNOSIS — B351 Tinea unguium: Secondary | ICD-10-CM | POA: Diagnosis not present

## 2023-03-17 NOTE — Progress Notes (Signed)
Subjective: [name] [name] is a 72 y.o. female patient seen today in office with complaint of mildly painful thickened and elongated toenails and callus to toes; unable to trim.  Patient denies any other pedal complaints at this time. History of PVD   Patient Active Problem List   Diagnosis Date Noted   Pyuria 01/21/2022   Hematuria 01/21/2022   GIB (gastrointestinal bleeding) 01/20/2022   Chronic heart failure with preserved ejection fraction (HFpEF) (HCC) 01/20/2022   Rectal bleeding    Fecal impaction (HCC)    Malnutrition of moderate degree 01/03/2022   Normocytic anemia 01/02/2022   Tibial plateau fracture, right 01/01/2022   Vertigo    Elevated BUN    Prolonged Q-T interval on ECG    Chronic obstructive pulmonary disease (HCC)    Premature ventricular contractions    Slow transit constipation    Torticollis    Essential hypertension    Acute lower UTI    Brainstem infarct, acute (HCC) 01/13/2021   Left pontine cerebrovascular accident (HCC) 01/13/2021   CVA (cerebral vascular accident) (HCC) 01/11/2021   Right sided weakness    PVCs (premature ventricular contractions) 03/30/2020   Osteoporosis 07/05/2019   Nonischemic cardiomyopathy (HCC) 07/20/2018   Scoliosis 11/20/2016   Compression fracture of lumbar spine, non-traumatic, sequela 08/11/2016   At high risk for injury related to fall 08/11/2016   Encounter for therapeutic drug monitoring 11/27/2013   Hypothyroidism    CHF (congestive heart failure) (HCC)    Paroxysmal atrial fibrillation (HCC)    Arthritis    Neurogenic bladder    Warfarin anticoagulation    First degree heart block 01/29/2012   S/P mitral valve replacement 01/26/2012   S/P Maze operation for atrial fibrillation 01/26/2012   Anxiety 01/18/2012   Multiple sclerosis (HCC) 01/19/2007    Current Outpatient Medications on File Prior to Visit  Medication Sig Dispense Refill   Alpha-Lipoic Acid 100 MG CAPS Take by mouth 3 (three) times daily.      Biotin 10 MG CAPS Take 1 capsule by mouth 2 (two) times daily.     calcium carbonate (OS-CAL - DOSED IN MG OF ELEMENTAL CALCIUM) 1250 (500 Ca) MG tablet Take 1 tablet by mouth. Taking 1,500 mg by mouth every morning     Cholecalciferol (VITAMIN D3) 50 MCG (2000 UT) TABS Take 2,000-4,000 Units by mouth See admin instructions. Take one tablet (2000 units) by mouth every other day and take 2 tablets (4000 units) every other day     Coenzyme Q10 (CO Q-10) 100 MG CAPS Take 100 mg by mouth every morning.     Cranberry 500 MG CAPS Take 500 mg by mouth 2 (two) times daily with a meal.     Cyanocobalamin (VITAMIN B-12) 1000 MCG SUBL Place 1,000 mcg under the tongue every morning.     fluticasone (FLONASE) 50 MCG/ACT nasal spray Place 1 spray into both nostrils daily. (Patient taking differently: Place 1 spray into both nostrils every morning.) 16 g 6   glucosamine-chondroitin 500-400 MG tablet Take 1 tablet by mouth 2 (two) times daily with a meal.     lisinopril (ZESTRIL) 5 MG tablet Take 1 tablet (5 mg total) by mouth daily. 90 tablet 3   Lysine 500 MG CAPS Take 1,000 mg by mouth 2 (two) times daily.     Magnesium Citrate 100 MG TABS Take 200 mg by mouth every morning.     metoprolol tartrate (LOPRESSOR) 25 MG tablet TAKE 1 TAB (25 MG) PO TWICE DAILY  180 tablet 3   Nutritional Supplements (BOOST VHC) LIQD Take 120 mLs by mouth in the morning and at bedtime.     Omega-3 Fatty Acids (FISH OIL) 1200 MG CAPS Take 1,200 mg by mouth 2 (two) times daily with a meal.     progesterone (PROMETRIUM) 100 MG capsule Take 100 mg by mouth every morning.     thyroid (ARMOUR) 90 MG tablet Take 90 mg by mouth every morning.     triamcinolone cream (KENALOG) 0.1 % Apply 1 Application topically 2 (two) times daily. 30 g 0   Turmeric 500 MG CAPS Take 500 mg by mouth every evening.     vitamin A 16109 UNIT capsule Take 10,000 Units by mouth every evening.     vitamin E 400 UNIT capsule Take 400 Units by mouth every evening.      warfarin (COUMADIN) 5 MG tablet Take 1 tablet by mouth daily except 1.5 tablets on Sundays, Tuesdays, and Thursdays or as directed Anticoagulation Clinic. 120 tablet 1   No current facility-administered medications on file prior to visit.    Allergies  Allergen Reactions   Erythromycin Nausea And Vomiting   Atorvastatin Other (See Comments)    Pt reports causes body to be stiff, joints ache, aching in knees and ankles, feels more fatigued.    Lactose Intolerance (Gi) Other (See Comments)    Upset stomach - yoghurt   Valsartan Other (See Comments)    Pt reports caused her depression    Objective: Physical Exam  General: Well developed, nourished, no acute distress, awake, alert and oriented x 3  Vascular: Dorsalis pedis artery 1/4 bilateral, Posterior tibial artery 0/4 bilateral, skin temperature warm to warm proximal to distal bilateral lower extremities, moderate varicosities, no pedal hair present bilateral.  Trace edema noted bilateral.  Neurological: Gross sensation present via light touch bilateral.   Dermatological: Skin is warm, dry, and supple bilateral, Nails 1-10 are tender, long, thick, and discolored with mild subungal debris with first toes most involved bilateral, no webspace macerations present bilateral, no open lesions present bilateral, + callus/corns/hyperkeratotic tissue present bilateral 3rd toes and left fourth toe. No signs of infection bilateral. Erythema and scalling noted to dorsum of left hallux.   Musculoskeletal: Asymptomatic hammertoe boney deformities and fat pad atrophy noted bilateral. Muscular strength within normal limits without painon range of motion. No pain with calf compression bilateral.  Assessment and Plan:  Problem List Items Addressed This Visit   None    -Examined patient.  -Discussed treatment options for painful mycotic nails and corns. -Mechanically debrided and reduced mycotic nails with sterile nail nipper and dremel nail file  without incident. -Mechanically debrided corns bilateral third toes using a chisel blade without incident -Continue triamcinalone -Advised good supportive shoes and sizing up to avoid rubbing and hammertoes -Return in 3 months for routine foot care or sooner if problems or issues arise  Louann Sjogren, DPM

## 2023-03-19 DIAGNOSIS — E039 Hypothyroidism, unspecified: Secondary | ICD-10-CM | POA: Diagnosis not present

## 2023-03-30 ENCOUNTER — Ambulatory Visit: Payer: Medicare Other | Attending: Cardiology | Admitting: *Deleted

## 2023-03-30 DIAGNOSIS — E039 Hypothyroidism, unspecified: Secondary | ICD-10-CM | POA: Diagnosis not present

## 2023-03-30 DIAGNOSIS — Z952 Presence of prosthetic heart valve: Secondary | ICD-10-CM | POA: Diagnosis not present

## 2023-03-30 DIAGNOSIS — Z5181 Encounter for therapeutic drug level monitoring: Secondary | ICD-10-CM | POA: Insufficient documentation

## 2023-03-30 DIAGNOSIS — Z9889 Other specified postprocedural states: Secondary | ICD-10-CM | POA: Diagnosis not present

## 2023-03-30 DIAGNOSIS — Z8679 Personal history of other diseases of the circulatory system: Secondary | ICD-10-CM | POA: Insufficient documentation

## 2023-03-30 LAB — POCT INR: INR: 2 (ref 2.0–3.0)

## 2023-03-30 NOTE — Patient Instructions (Addendum)
Description   Today take 1.5 tablets of warfarin then continue taking warfarin 1 tablet daily except for 1.5 tablets on Thursday and Saturday. Recheck INR in 5 weeks. [facility] Clinic [phone] or [phone]

## 2023-05-04 ENCOUNTER — Ambulatory Visit: Payer: Medicare Other

## 2023-05-07 ENCOUNTER — Telehealth: Payer: Self-pay | Admitting: Family Medicine

## 2023-05-07 ENCOUNTER — Telehealth: Payer: Self-pay | Admitting: *Deleted

## 2023-05-07 NOTE — Telephone Encounter (Signed)
Spoke with patient regarding results/recommendations.  

## 2023-05-07 NOTE — Telephone Encounter (Signed)
[name] called and asked to speak with [name] [name]. She would like to speak with [name] [name] or another CMA regarding potential home health orders due to difficulty coming to appointments. Please give the patient a call back to discuss information.

## 2023-05-07 NOTE — Telephone Encounter (Signed)
Pt called to reschedule Anticoagulation Appt. She states she will call back after she contacts her PCP to see if she can get Home Health Services and Physical Therapy.

## 2023-05-11 ENCOUNTER — Ambulatory Visit: Payer: Medicare Other

## 2023-05-12 ENCOUNTER — Encounter: Payer: Self-pay | Admitting: Family Medicine

## 2023-05-12 ENCOUNTER — Telehealth: Payer: Self-pay | Admitting: Cardiology

## 2023-05-12 ENCOUNTER — Telehealth (INDEPENDENT_AMBULATORY_CARE_PROVIDER_SITE_OTHER): Payer: Medicare Other | Admitting: Family Medicine

## 2023-05-12 VITALS — Wt 138.0 lb

## 2023-05-12 DIAGNOSIS — Z7901 Long term (current) use of anticoagulants: Secondary | ICD-10-CM

## 2023-05-12 DIAGNOSIS — R531 Weakness: Secondary | ICD-10-CM

## 2023-05-12 DIAGNOSIS — I480 Paroxysmal atrial fibrillation: Secondary | ICD-10-CM

## 2023-05-12 DIAGNOSIS — M436 Torticollis: Secondary | ICD-10-CM | POA: Diagnosis not present

## 2023-05-12 DIAGNOSIS — M4150 Other secondary scoliosis, site unspecified: Secondary | ICD-10-CM

## 2023-05-12 DIAGNOSIS — I5032 Chronic diastolic (congestive) heart failure: Secondary | ICD-10-CM

## 2023-05-12 DIAGNOSIS — I63342 Cerebral infarction due to thrombosis of left cerebellar artery: Secondary | ICD-10-CM

## 2023-05-12 DIAGNOSIS — Z8673 Personal history of transient ischemic attack (TIA), and cerebral infarction without residual deficits: Secondary | ICD-10-CM

## 2023-05-12 DIAGNOSIS — G35 Multiple sclerosis: Secondary | ICD-10-CM | POA: Diagnosis not present

## 2023-05-12 NOTE — Telephone Encounter (Signed)
Also placed to community care coordination referral in hopes to help her with transportation needs and see if patient can get PT/INR checks at home.  This order and management would come from her cardiology team and the Coumadin clinic-Candace Hemphill, she manages this condition for her.

## 2023-05-12 NOTE — Telephone Encounter (Signed)
Called and spoke with Erie Noe, CMA at PCP to determine if pt has Home Health order. Pt saw PCP today and a Home Health Referral was placed as well as a Community Care order. I made Erie Noe aware that our Coumadin Clinic can provide a order for INR to be checked when pt is established with a Home Health Agency. Erie Noe verbalized understanding and stated she would call Coumadin Clinic back when [facility] agency has been established. I also called pt and made her aware of this information. Pt verbalized understanding.

## 2023-05-12 NOTE — Telephone Encounter (Signed)
Pt would like a callback regarding an order for Home Health per her PCP. Pt states that her PCP is willing to put in an order for her to have Home Health so that she is able to have her INR checks being that pt is unable to come into the office now. Pt says that PCP needs an sign off of approval from our office. She would like a callback regarding this matter. Please advise

## 2023-05-12 NOTE — Progress Notes (Signed)
VIRTUAL VISIT VIA VIDEO  I connected with [name] [name] on 05/12/23 at 10:40 AM EDT by a video enabled telemedicine application and verified that I am speaking with the correct person using two identifiers. Location patient: Home Location provider: [facility], Office Persons participating in the virtual visit: Patient, Dr. Claiborne Billings and Ivonne Andrew, CMA  I discussed the limitations of evaluation and management by telemedicine and the availability of in person appointments. The patient expressed understanding and agreed to proceed.   [name] [name] , May 27, 1951, 72 y.o., female MRN: [id number] Patient Care Team    Relationship Specialty Notifications Start End  Natalia Leatherwood, DO PCP - General Family Medicine  07/31/16   Meriam Sprague, MD PCP - Cardiology Cardiology  07/14/22   Marinus Maw, MD PCP - Electrophysiology Cardiology  07/14/22   Storm Frisk, MD Attending Physician Pulmonary Disease Abnormal results only, Admissions 03/03/12   Lenn Sink, DPM Consulting Physician Podiatry  08/11/16   Lars Masson, MD Consulting Physician Cardiology  08/11/16   Becky Augusta, MD  Alternative Medicine  08/11/16   Ditty, Loura Halt, MD Consulting Physician Neurosurgery  08/11/16   Genia Del, MD Consulting Physician Obstetrics and Gynecology  09/18/16   Casimer Leek  Dentistry  09/18/16   Cherlyn Roberts, MD Consulting Physician Dermatology  09/18/16   Drema Dallas, DO Consulting Physician Neurology  12/19/18   Nelson Chimes, MD Consulting Physician Ophthalmology  03/28/19    Comment: [place]    Chief Complaint  Patient presents with   Referral    Discuss need for home health     Subjective: [name] [name] is a 72 y.o. Pt presents for virtual appt to discuss need for home health.  She reports her needs are Home health physical therapy to help her strengthen.  She still feels weak and has a fear of falling since her fall last  year in which she sustained fracture. She also reports having difficulty getting to her appointments secondary to lack of transportation. She also has a pertinent medical history of CHF, A-fib, warfarin anticoagulation, lumbar spine compression fracture, scoliosis, osteoporosis, history of CVA and multiple sclerosis She has been following routinely with cardiology, gynecology and podiatry.      05/20/2022    2:52 PM 02/04/2022    1:28 PM 05/14/2021    3:04 PM 02/06/2021    2:46 PM 12/18/2019    4:16 PM  Depression screen PHQ 2/9  Decreased Interest 0 0 0 0 0  Down, Depressed, Hopeless 0 0 0 0 0  PHQ - 2 Score 0 0 0 0 0  Tired, decreased energy    1   Change in appetite    0   Feeling bad or failure about yourself     0   Trouble concentrating    0   Moving slowly or fidgety/restless    0   Suicidal thoughts    0   Difficult doing work/chores    Not difficult at all     Allergies  Allergen Reactions   Erythromycin Nausea And Vomiting   Atorvastatin Other (See Comments)    Pt reports causes body to be stiff, joints ache, aching in knees and ankles, feels more fatigued.    Lactose Intolerance (Gi) Other (See Comments)    Upset stomach - yoghurt   Valsartan Other (See Comments)    Pt reports caused her depression   Social History  Social History Narrative   Patient is a widower (since 37), he currently lives with her son and daughter-in-law. She has 2 children.   She is college educated, and retired from the [employer].   She uses herbal remedies, and multiple over-the-counter supplements. She takes a daily vitamin.   She wears her seatbelt, exercises routinely, smoke detector in the home.   Requires a walker or wheelchair at times.   Feels safe in her relationships.   Past Medical History:  Diagnosis Date   Anxiety    Arthritis    knees   Breast cancer (HCC) 1999   COPD (chronic obstructive pulmonary disease) (HCC)    COPD with emphysema.. Assess by pulmonary team in  the hospital April, 2013   GI bleed    GIB (gastrointestinal bleeding) 01/20/2022   Herpes    Hypothyroidism    IBS (irritable bowel syndrome)    Mitral valve regurgitation    Mitral valve replacement April, 2013, Mitral valve prolapse   Multiple sclerosis (HCC)    Neurogenic bladder    NICM (nonischemic cardiomyopathy) (HCC)    Osteoporosis    Ovarian cyst    Paroxysmal atrial fibrillation (HCC)    Rapid atrial fibrillation in-hospital, Rapid cardioversion,  before mitral valve surgery   Pulmonary hypertension (HCC)    Echo, April, 2013, before mitral valve surgery   PVC's (premature ventricular contractions)    S/P Maze operation for atrial fibrillation 01/26/2012   Complete biatrial lesion set using cryothermy via right mini thoracotomy   S/P mitral valve replacement 01/26/2012   31mm Sorin [employer] Optiform mechanical prosthesis via right mini thoracotomy   Stroke (cerebrum) (HCC)    Tibial plateau fracture, right 01/01/2022   Vertebral artery stenosis    right   Warfarin anticoagulation    Mechanical mitral prosthesis, April, 16109   Past Surgical History:  Procedure Laterality Date   BREAST LUMPECTOMY Right 1999   with sent.node, and axillary dissection (20)   CHEST TUBE INSERTION  01/26/2012   Procedure: CHEST TUBE INSERTION;  Surgeon: Purcell Nails, MD;  Location: [facility] OR;  Service: Open Heart Surgery;  Laterality: Left;   COLONOSCOPY  2010   "normal"   CYSTOSCOPY  1992   LAPAROSCOPIC OVARIAN CYSTECTOMY  1978   urethral stricture repair   LEFT AND RIGHT HEART CATHETERIZATION WITH CORONARY ANGIOGRAM N/A 01/20/2012   Procedure: LEFT AND RIGHT HEART CATHETERIZATION WITH CORONARY ANGIOGRAM;  Surgeon: Kathleene Hazel, MD;  Location: [facility] CATH LAB;  Service: Cardiovascular;  Laterality: N/A;   LYMPHADENECTOMY     MAZE  01/26/2012   Procedure: MAZE;  Surgeon: Purcell Nails, MD;  Location: [facility] OR;  Service: Open Heart Surgery;  Laterality: N/A;   MITRAL VALVE  REPLACEMENT  01/26/2012   Procedure: MINIMALLY INVASIVE MITRAL VALVE (MV) REPLACEMENT;  Surgeon: Purcell Nails, MD;  Location: [facility] OR;  Service: Open Heart Surgery;  Laterality: Right;   ORIF TIBIA PLATEAU Right 01/05/2022   Procedure: OPEN REDUCTION INTERNAL FIXATION (ORIF) TIBIAL PLATEAU AND SHAFT;  Surgeon: Myrene Galas, MD;  Location: [facility] OR;  Service: Orthopedics;  Laterality: Right;   TEE WITHOUT CARDIOVERSION  01/19/2012   Procedure: TRANSESOPHAGEAL ECHOCARDIOGRAM (TEE);  Surgeon: Peter M Swaziland, MD;  Location[facility] ENDOSCOPY;  Service: Cardiovascular;  Laterality: N/A;   TONSILLECTOMY  1970   UMBILICAL HERNIA REPAIR  1952   WRIST SURGERY Right 2012   Family History  Problem Relation Age of Onset   Heart disease Father  cardiac arrest    CAD Father    Hypertension Father    CAD Mother        5 stents and numerous bypass surgery   Hypertension Mother    Hyperlipidemia Mother    CAD Other    Breast cancer Maternal Grandmother    Breast cancer Maternal Aunt    Allergies as of 05/12/2023       Reactions   Erythromycin Nausea And Vomiting   Atorvastatin Other (See Comments)   Pt reports causes body to be stiff, joints ache, aching in knees and ankles, feels more fatigued.    Lactose Intolerance (gi) Other (See Comments)   Upset stomach - yoghurt   Valsartan Other (See Comments)   Pt reports caused her depression        Medication List        Accurate as of May 12, 2023 12:39 PM. If you have any questions, ask your nurse or doctor.          STOP taking these medications    Boost VHC Liqd Stopped by: Felix Pacini       TAKE these medications    Alpha-Lipoic Acid 100 MG Caps Take by mouth 3 (three) times daily.   Biotin 10 MG Caps Take 1 capsule by mouth 2 (two) times daily.   calcium carbonate 1250 (500 Ca) MG tablet Commonly known as: OS-CAL - dosed in mg of elemental calcium Take 1 tablet by mouth. Taking 1,500 mg by mouth every morning   Co Q-10  100 MG Caps Take 100 mg by mouth every morning.   Cranberry 500 MG Caps Take 500 mg by mouth 2 (two) times daily with a meal.   Fish Oil 1200 MG Caps Take 1,200 mg by mouth 2 (two) times daily with a meal.   fluticasone 50 MCG/ACT nasal spray Commonly known as: FLONASE Place 1 spray into both nostrils daily. What changed: when to take this   glucosamine-chondroitin 500-400 MG tablet Take 1 tablet by mouth 2 (two) times daily with a meal.   lisinopril 5 MG tablet Commonly known as: ZESTRIL Take 1 tablet (5 mg total) by mouth daily.   Lysine 500 MG Caps Take 1,000 mg by mouth 2 (two) times daily.   Magnesium Citrate 100 MG Tabs Take 200 mg by mouth every morning.   metoprolol tartrate 25 MG tablet Commonly known as: LOPRESSOR TAKE 1 TAB (25 MG) PO TWICE DAILY   progesterone 100 MG capsule Commonly known as: PROMETRIUM Take 100 mg by mouth every morning.   thyroid 90 MG tablet Commonly known as: ARMOUR Take 90 mg by mouth every morning.   triamcinolone cream 0.1 % Commonly known as: KENALOG Apply 1 Application topically 2 (two) times daily.   Turmeric 500 MG Caps Take 500 mg by mouth every evening.   vitamin A 40981 UNIT capsule Take 10,000 Units by mouth every evening.   Vitamin B-12 1000 MCG Subl Place 1,000 mcg under the tongue every morning.   Vitamin D3 50 MCG (2000 UT) Tabs Take 2,000-4,000 Units by mouth See admin instructions. Take one tablet (2000 units) by mouth every other day and take 2 tablets (4000 units) every other day   vitamin E 180 MG (400 UNITS) capsule Take 400 Units by mouth every evening.   warfarin 5 MG tablet Commonly known as: Coumadin Take as directed by the anticoagulation clinic. If you are unsure how to take this medication, talk to your nurse or doctor. Original instructions:  Take 1 tablet by mouth daily except 1.5 tablets on Sundays, Tuesdays, and Thursdays or as directed Anticoagulation Clinic.        All past medical  history, surgical history, allergies, family history, immunizations andmedications were updated in the EMR today and reviewed under the history and medication portions of their EMR.     ROS Negative, with the exception of above mentioned in HPI   Objective:  Wt 138 lb (62.6 kg)   BMI 21.61 kg/m  Body mass index is 21.61 kg/m. Physical Exam Vitals and nursing note reviewed.  Constitutional:      General: She is not in acute distress.    Appearance: Normal appearance. She is not toxic-appearing.  HENT:     Head: Normocephalic and atraumatic.  Eyes:     General: No scleral icterus.       Right eye: No discharge.        Left eye: No discharge.     Conjunctiva/sclera: Conjunctivae normal.  Pulmonary:     Effort: Pulmonary effort is normal.  Musculoskeletal:     Cervical back: Normal range of motion.  Skin:    Findings: No rash.  Neurological:     Mental Status: She is alert and oriented to person, place, and time. Mental status is at baseline.  Psychiatric:        Mood and Affect: Mood normal.        Behavior: Behavior normal.        Thought Content: Thought content normal.        Judgment: Judgment normal.      No results found. No results found. No results found. However, due to the size of the patient record, not all encounters were searched. Please check Results Review for a complete set of results.  Assessment/Plan: [name] [name] is a 72 y.o. female present for OV for  Multiple sclerosis (HCC)-scoliosis/torticollis/right-sided weakness/history of CVA/CHF/A-fib/warfarin anticoagulation Discussed physical therapy to come out of the home to help her with strengthening and balancing exercises by home health referral.  She has been established with [facility] in the past and prefers to be referred back to them. Social work referral also placed to help her if needed Also placed to community care coordination referral in hopes to help her with transportation needs  and see if patient can get PT/INR checks at home.  This order and management would come from her cardiology team and the Coumadin clinic-Candace Hemphill, she manages this condition for her. - AMB Referral to Community Care Coordinaton ([facility] Patients) - Ambulatory referral to Home Health   Reviewed expectations re: course of current medical issues. Discussed self-management of symptoms. Outlined signs and symptoms indicating need for more acute intervention. Patient verbalized understanding and all questions were answered. Patient received an After-Visit Summary.    Orders Placed This Encounter  Procedures   AMB Referral to Community Care Coordinaton ([facility] Patients)   Ambulatory referral to Home Health   No orders of the defined types were placed in this encounter.  Referral Orders         AMB Referral to Community Care Coordinaton ([facility] Patients)         Ambulatory referral to Home Health       Note is dictated utilizing voice recognition software. Although note has been proof read prior to signing, occasional typographical errors still can be missed. If any questions arise, please do not hesitate to call for verification.   electronically signed by:  Felix Pacini, DO  [facility] - [place]

## 2023-05-13 ENCOUNTER — Telehealth: Payer: Self-pay | Admitting: Family Medicine

## 2023-05-13 ENCOUNTER — Ambulatory Visit: Payer: Medicare Other

## 2023-05-13 NOTE — Telephone Encounter (Signed)
Home health referral placed yesterday for patient-[facility]. Please see if a nurse can be added on to have PT/INR collected at home to that order.  However, it is very important they understand that this part of the order and the results collected needs to be called to the Coumadin clinic nurse, and this provider.  We do not manage that condition.

## 2023-05-17 NOTE — Telephone Encounter (Signed)
Request faxed

## 2023-05-18 ENCOUNTER — Ambulatory Visit: Payer: Self-pay | Admitting: *Deleted

## 2023-05-18 DIAGNOSIS — F419 Anxiety disorder, unspecified: Secondary | ICD-10-CM | POA: Diagnosis not present

## 2023-05-18 DIAGNOSIS — Z952 Presence of prosthetic heart valve: Secondary | ICD-10-CM | POA: Diagnosis not present

## 2023-05-18 DIAGNOSIS — M436 Torticollis: Secondary | ICD-10-CM | POA: Diagnosis not present

## 2023-05-18 DIAGNOSIS — N319 Neuromuscular dysfunction of bladder, unspecified: Secondary | ICD-10-CM | POA: Diagnosis not present

## 2023-05-18 DIAGNOSIS — Z5181 Encounter for therapeutic drug level monitoring: Secondary | ICD-10-CM

## 2023-05-18 DIAGNOSIS — Z79899 Other long term (current) drug therapy: Secondary | ICD-10-CM | POA: Diagnosis not present

## 2023-05-18 DIAGNOSIS — Z8679 Personal history of other diseases of the circulatory system: Secondary | ICD-10-CM | POA: Diagnosis not present

## 2023-05-18 DIAGNOSIS — J439 Emphysema, unspecified: Secondary | ICD-10-CM | POA: Diagnosis not present

## 2023-05-18 DIAGNOSIS — Z5982 Transportation insecurity: Secondary | ICD-10-CM | POA: Diagnosis not present

## 2023-05-18 DIAGNOSIS — M4150 Other secondary scoliosis, site unspecified: Secondary | ICD-10-CM | POA: Diagnosis not present

## 2023-05-18 DIAGNOSIS — Z8673 Personal history of transient ischemic attack (TIA), and cerebral infarction without residual deficits: Secondary | ICD-10-CM | POA: Diagnosis not present

## 2023-05-18 DIAGNOSIS — E039 Hypothyroidism, unspecified: Secondary | ICD-10-CM | POA: Diagnosis not present

## 2023-05-18 DIAGNOSIS — Z9889 Other specified postprocedural states: Secondary | ICD-10-CM | POA: Diagnosis not present

## 2023-05-18 DIAGNOSIS — I272 Pulmonary hypertension, unspecified: Secondary | ICD-10-CM | POA: Diagnosis not present

## 2023-05-18 DIAGNOSIS — G35 Multiple sclerosis: Secondary | ICD-10-CM | POA: Diagnosis not present

## 2023-05-18 DIAGNOSIS — I110 Hypertensive heart disease with heart failure: Secondary | ICD-10-CM | POA: Diagnosis not present

## 2023-05-18 DIAGNOSIS — I5032 Chronic diastolic (congestive) heart failure: Secondary | ICD-10-CM | POA: Diagnosis not present

## 2023-05-18 DIAGNOSIS — Z853 Personal history of malignant neoplasm of breast: Secondary | ICD-10-CM | POA: Diagnosis not present

## 2023-05-18 DIAGNOSIS — I493 Ventricular premature depolarization: Secondary | ICD-10-CM | POA: Diagnosis not present

## 2023-05-18 DIAGNOSIS — D649 Anemia, unspecified: Secondary | ICD-10-CM | POA: Diagnosis not present

## 2023-05-18 DIAGNOSIS — Z9181 History of falling: Secondary | ICD-10-CM | POA: Diagnosis not present

## 2023-05-18 DIAGNOSIS — M810 Age-related osteoporosis without current pathological fracture: Secondary | ICD-10-CM | POA: Diagnosis not present

## 2023-05-18 DIAGNOSIS — I480 Paroxysmal atrial fibrillation: Secondary | ICD-10-CM | POA: Diagnosis not present

## 2023-05-18 DIAGNOSIS — Z7901 Long term (current) use of anticoagulants: Secondary | ICD-10-CM | POA: Diagnosis not present

## 2023-05-18 DIAGNOSIS — I428 Other cardiomyopathies: Secondary | ICD-10-CM | POA: Diagnosis not present

## 2023-05-18 LAB — POCT INR: INR: 3.5 — AB (ref 2.0–3.0)

## 2023-05-18 NOTE — Patient Instructions (Signed)
Description   Spoke with Tresa Endo PT [facility] and advised continue taking warfarin 1 tablet daily except for 1.5 tablets on Thursday and Saturday. Recheck INR in 4 weeks. Coumadin Clinic [phone] or [phone]

## 2023-05-19 ENCOUNTER — Encounter (INDEPENDENT_AMBULATORY_CARE_PROVIDER_SITE_OTHER): Payer: Self-pay

## 2023-05-20 DIAGNOSIS — J439 Emphysema, unspecified: Secondary | ICD-10-CM | POA: Diagnosis not present

## 2023-05-20 DIAGNOSIS — I480 Paroxysmal atrial fibrillation: Secondary | ICD-10-CM | POA: Diagnosis not present

## 2023-05-20 DIAGNOSIS — I428 Other cardiomyopathies: Secondary | ICD-10-CM | POA: Diagnosis not present

## 2023-05-20 DIAGNOSIS — G35 Multiple sclerosis: Secondary | ICD-10-CM | POA: Diagnosis not present

## 2023-05-20 DIAGNOSIS — I5032 Chronic diastolic (congestive) heart failure: Secondary | ICD-10-CM | POA: Diagnosis not present

## 2023-05-20 DIAGNOSIS — I110 Hypertensive heart disease with heart failure: Secondary | ICD-10-CM | POA: Diagnosis not present

## 2023-05-24 ENCOUNTER — Telehealth: Payer: Self-pay

## 2023-05-24 NOTE — Telephone Encounter (Signed)
LM for pt to return call to discuss.  Also wanted to ask if PT/INR was being done for pt at home as well as requested by fax

## 2023-05-24 NOTE — Telephone Encounter (Signed)
Caller/Agency: Tresa Endo w/ [facility]  Callback Number: [phone]  Requesting OT/PT/Skilled Nursing/Social Work/Speech Therapy: PT  Frequency:  1x weekly for 1 week 2x weekly for 3 weeks 1x weekly for 5 weeks

## 2023-05-24 NOTE — Telephone Encounter (Signed)
Approved.  

## 2023-05-25 NOTE — Telephone Encounter (Signed)
LM for pt to return call to discuss.  

## 2023-05-25 NOTE — Telephone Encounter (Signed)
Please see if a nurse can be added on to have PT/INR collected at home to that order. However, it is very important they understand that this part of the order and the results collected needs to be called to the Coumadin clinic nurse, and this provider. We do not manage that condition.

## 2023-05-26 DIAGNOSIS — I428 Other cardiomyopathies: Secondary | ICD-10-CM | POA: Diagnosis not present

## 2023-05-26 DIAGNOSIS — I110 Hypertensive heart disease with heart failure: Secondary | ICD-10-CM | POA: Diagnosis not present

## 2023-05-26 DIAGNOSIS — I480 Paroxysmal atrial fibrillation: Secondary | ICD-10-CM | POA: Diagnosis not present

## 2023-05-26 DIAGNOSIS — I5032 Chronic diastolic (congestive) heart failure: Secondary | ICD-10-CM | POA: Diagnosis not present

## 2023-05-26 DIAGNOSIS — J439 Emphysema, unspecified: Secondary | ICD-10-CM | POA: Diagnosis not present

## 2023-05-26 DIAGNOSIS — G35 Multiple sclerosis: Secondary | ICD-10-CM | POA: Diagnosis not present

## 2023-05-29 DIAGNOSIS — I480 Paroxysmal atrial fibrillation: Secondary | ICD-10-CM | POA: Diagnosis not present

## 2023-05-29 DIAGNOSIS — J439 Emphysema, unspecified: Secondary | ICD-10-CM | POA: Diagnosis not present

## 2023-05-29 DIAGNOSIS — I5032 Chronic diastolic (congestive) heart failure: Secondary | ICD-10-CM | POA: Diagnosis not present

## 2023-05-29 DIAGNOSIS — I428 Other cardiomyopathies: Secondary | ICD-10-CM | POA: Diagnosis not present

## 2023-05-29 DIAGNOSIS — I110 Hypertensive heart disease with heart failure: Secondary | ICD-10-CM | POA: Diagnosis not present

## 2023-05-29 DIAGNOSIS — G35 Multiple sclerosis: Secondary | ICD-10-CM | POA: Diagnosis not present

## 2023-05-31 DIAGNOSIS — I5032 Chronic diastolic (congestive) heart failure: Secondary | ICD-10-CM | POA: Diagnosis not present

## 2023-05-31 DIAGNOSIS — I480 Paroxysmal atrial fibrillation: Secondary | ICD-10-CM | POA: Diagnosis not present

## 2023-05-31 DIAGNOSIS — J439 Emphysema, unspecified: Secondary | ICD-10-CM | POA: Diagnosis not present

## 2023-05-31 DIAGNOSIS — I428 Other cardiomyopathies: Secondary | ICD-10-CM | POA: Diagnosis not present

## 2023-05-31 DIAGNOSIS — G35 Multiple sclerosis: Secondary | ICD-10-CM | POA: Diagnosis not present

## 2023-05-31 DIAGNOSIS — I110 Hypertensive heart disease with heart failure: Secondary | ICD-10-CM | POA: Diagnosis not present

## 2023-06-02 ENCOUNTER — Ambulatory Visit (INDEPENDENT_AMBULATORY_CARE_PROVIDER_SITE_OTHER): Payer: Medicare Other

## 2023-06-02 VITALS — Wt 138.0 lb

## 2023-06-02 DIAGNOSIS — Z0000 Encounter for general adult medical examination without abnormal findings: Secondary | ICD-10-CM

## 2023-06-02 DIAGNOSIS — Z1231 Encounter for screening mammogram for malignant neoplasm of breast: Secondary | ICD-10-CM | POA: Diagnosis not present

## 2023-06-02 DIAGNOSIS — Z1211 Encounter for screening for malignant neoplasm of colon: Secondary | ICD-10-CM

## 2023-06-02 NOTE — Progress Notes (Addendum)
Subjective:   [name] [name] is a 72 y.o. female who presents for Medicare Annual (Subsequent) preventive examination.  Visit Complete: Virtual  I connected with  [name] [name] on 06/02/23 by a audio enabled telemedicine application and verified that I am speaking with the correct person using two identifiers.  Patient Location: Home  Provider Location: Home Office  I discussed the limitations of evaluation and management by telemedicine. The patient expressed understanding and agreed to proceed.  Patient Medicare AWV questionnaire was completed by the patient on 06/02/23; I have confirmed that all information answered by patient is correct and no changes since this date.   Vital Signs: Unable to obtain new vitals due to this being a telehealth visit.   Review of Systems     Cardiac Risk Factors include: advanced age (>46men, >12 women);hypertension     Objective:    Today's Vitals   06/02/23 1435  Weight: 138 lb (62.6 kg)   Body mass index is 21.61 kg/m.     06/02/2023    2:42 PM 05/20/2022    2:53 PM 02/04/2022    2:55 PM 01/20/2022    5:00 PM 01/20/2022    8:15 AM 01/03/2022    8:00 PM 05/14/2021    3:00 PM  Advanced Directives  Does Patient Have a Medical Advance Directive? Yes Yes Yes  Yes No Yes  Type of Estate agent of [place];Living will Healthcare Power of [employer] Power of [employer] Power of [place];Living will  Does patient want to make changes to medical advance directive? No - Patient declined  No - Patient declined No - Patient declined No - Patient declined    Copy of Healthcare Power of Attorney in Chart? Yes - validated most recent copy scanned in chart (See row information) Yes - validated most recent copy scanned in chart (See row information)     No - copy requested  Would patient like information on creating a medical advance directive?   No - Patient declined Yes (Inpatient - patient defers creating a  medical advance directive and declines information at this time) No - Patient declined No - Patient declined     Current Medications (verified) Outpatient Encounter Medications as of 06/02/2023  Medication Sig   Alpha-Lipoic Acid 100 MG CAPS Take by mouth 3 (three) times daily.   Biotin 10 MG CAPS Take 1 capsule by mouth 2 (two) times daily.   calcium carbonate (OS-CAL - DOSED IN MG OF ELEMENTAL CALCIUM) 1250 (500 Ca) MG tablet Take 1 tablet by mouth. Taking 1,500 mg by mouth every morning   Cholecalciferol (VITAMIN D3) 50 MCG (2000 UT) TABS Take 2,000-4,000 Units by mouth See admin instructions. Take one tablet (2000 units) by mouth every other day and take 2 tablets (4000 units) every other day   Coenzyme Q10 (CO Q-10) 100 MG CAPS Take 100 mg by mouth every morning.   Cranberry 500 MG CAPS Take 500 mg by mouth 2 (two) times daily with a meal.   Cyanocobalamin (VITAMIN B-12) 1000 MCG SUBL Place 1,000 mcg under the tongue every morning.   fluticasone (FLONASE) 50 MCG/ACT nasal spray Place 1 spray into both nostrils daily. (Patient taking differently: Place 1 spray into both nostrils every morning.)   glucosamine-chondroitin 500-400 MG tablet Take 1 tablet by mouth 2 (two) times daily with a meal.   lisinopril (ZESTRIL) 5 MG tablet Take 1 tablet (5 mg total) by mouth daily.   Lysine 500  MG CAPS Take 1,000 mg by mouth 2 (two) times daily.   Magnesium Citrate 100 MG TABS Take 200 mg by mouth every morning.   metoprolol tartrate (LOPRESSOR) 25 MG tablet TAKE 1 TAB (25 MG) PO TWICE DAILY   Omega-3 Fatty Acids (FISH OIL) 1200 MG CAPS Take 1,200 mg by mouth 2 (two) times daily with a meal.   progesterone (PROMETRIUM) 100 MG capsule Take 100 mg by mouth every morning.   thyroid (ARMOUR) 90 MG tablet Take 90 mg by mouth every morning.   triamcinolone cream (KENALOG) 0.1 % Apply 1 Application topically 2 (two) times daily.   Turmeric 500 MG CAPS Take 500 mg by mouth every evening.   vitamin A 40981 UNIT  capsule Take 10,000 Units by mouth every evening.   vitamin E 400 UNIT capsule Take 400 Units by mouth every evening.   warfarin (COUMADIN) 5 MG tablet Take 1 tablet by mouth daily except 1.5 tablets on Sundays, Tuesdays, and Thursdays or as directed Anticoagulation Clinic.   No facility-administered encounter medications on file as of 06/02/2023.    Allergies (verified) Erythromycin, Atorvastatin, Lactose intolerance (gi), and Valsartan   History: Past Medical History:  Diagnosis Date   Anxiety    Arthritis    knees   Breast cancer (HCC) 1999   COPD (chronic obstructive pulmonary disease) (HCC)    COPD with emphysema.. Assess by pulmonary team in the hospital April, 2013   GI bleed    GIB (gastrointestinal bleeding) 01/20/2022   Herpes    Hypothyroidism    IBS (irritable bowel syndrome)    Mitral valve regurgitation    Mitral valve replacement April, 2013, Mitral valve prolapse   Multiple sclerosis (HCC)    Neurogenic bladder    NICM (nonischemic cardiomyopathy) (HCC)    Osteoporosis    Ovarian cyst    Paroxysmal atrial fibrillation (HCC)    Rapid atrial fibrillation in-hospital, Rapid cardioversion,  before mitral valve surgery   Pulmonary hypertension (HCC)    Echo, April, 2013, before mitral valve surgery   PVC's (premature ventricular contractions)    S/P Maze operation for atrial fibrillation 01/26/2012   Complete biatrial lesion set using cryothermy via right mini thoracotomy   S/P mitral valve replacement 01/26/2012   31mm Sorin [employer] Optiform mechanical prosthesis via right mini thoracotomy   Stroke (cerebrum) (HCC)    Tibial plateau fracture, right 01/01/2022   Vertebral artery stenosis    right   Warfarin anticoagulation    Mechanical mitral prosthesis, April, 19147   Past Surgical History:  Procedure Laterality Date   BREAST LUMPECTOMY Right 1999   with sent.node, and axillary dissection (20)   CHEST TUBE INSERTION  01/26/2012   Procedure: CHEST TUBE  INSERTION;  Surgeon: Purcell Nails, MD;  Location: [facility] OR;  Service: Open Heart Surgery;  Laterality: Left;   COLONOSCOPY  2010   "normal"   CYSTOSCOPY  1992   LAPAROSCOPIC OVARIAN CYSTECTOMY  1978   urethral stricture repair   LEFT AND RIGHT HEART CATHETERIZATION WITH CORONARY ANGIOGRAM N/A 01/20/2012   Procedure: LEFT AND RIGHT HEART CATHETERIZATION WITH CORONARY ANGIOGRAM;  Surgeon: Kathleene Hazel, MD;  Location: [facility] CATH LAB;  Service: Cardiovascular;  Laterality: N/A;   LYMPHADENECTOMY     MAZE  01/26/2012   Procedure: MAZE;  Surgeon: Purcell Nails, MD;  Location: [facility] OR;  Service: Open Heart Surgery;  Laterality: N/A;   MITRAL VALVE REPLACEMENT  01/26/2012   Procedure: MINIMALLY INVASIVE MITRAL VALVE (MV) REPLACEMENT;  Surgeon: Purcell Nails, MD;  Location: [facility] OR;  Service: Open Heart Surgery;  Laterality: Right;   ORIF TIBIA PLATEAU Right 01/05/2022   Procedure: OPEN REDUCTION INTERNAL FIXATION (ORIF) TIBIAL PLATEAU AND SHAFT;  Surgeon: Myrene Galas, MD;  Location: [facility] OR;  Service: Orthopedics;  Laterality: Right;   TEE WITHOUT CARDIOVERSION  01/19/2012   Procedure: TRANSESOPHAGEAL ECHOCARDIOGRAM (TEE);  Surgeon: Peter M [place]az[place]and, MD;  Location: [facility] ENDOSCOPY;  Service: Cardiovascular;  Laterality: N/A;   TONSILLECTOMY  1970   UMBILICAL HERNIA REPAIR  1952   WRIST SURGERY Right 2012   Family History  Problem Relation Age of Onset   Heart disease Father        cardiac arrest    CAD Father    Hypertension Father    CAD Mother        5 stents and numerous bypass surgery   Hypertension Mother    Hyperlipidemia Mother    CAD Other    Breast cancer Maternal Grandmother    Breast cancer Maternal Aunt    Social History   Socioeconomic History   Marital status: Widowed    Spouse name: Not on file   Number of children: 2   Years of education: 31   Highest education level: Not on file  Occupational History   Occupation: Retired  Tobacco Use   Smoking status: Never    Smokeless tobacco: Never  Vaping Use   Vaping status: Never Used  Substance and Sexual Activity   Alcohol use: No   Drug use: No   Sexual activity: Not Currently    Partners: Male    Birth control/protection: Post-menopausal  Other Topics Concern   Not on file  Social History Narrative   Patient is a widower (since 64), he currently lives with her son and daughter-in-law. She has 2 children.   She is college educated, and retired from the [employer].   She uses herbal remedies, and multiple over-the-counter supplements. She takes a daily vitamin.   She wears her seatbelt, exercises routinely, smoke detector in the home.   Requires a walker or wheelchair at times.   Feels safe in her relationships.   Social Determinants of Health   Financial Resource Strain: Medium Risk (06/02/2023)   Overall Financial Resource Strain (CARDIA)    Difficulty of Paying Living Expenses: Somewhat hard  Food Insecurity: No Food Insecurity (06/02/2023)   Hunger Vital Sign    Worried About Running Out of Food in the Last Year: Never true    Ran Out of Food in the Last Year: Never true  Transportation Needs: Unmet Transportation Needs (06/02/2023)   PRAPARE - Transportation    Lack of Transportation (Medical): Yes    Lack of Transportation (Non-Medical): Patient declined  Physical Activity: Insufficiently Active (06/02/2023)   Exercise Vital Sign    Days of Exercise per Week: 2 days    Minutes of Exercise per Session: 60 min  Stress: No Stress Concern Present (06/02/2023)   [employer] of Occupational Health - Occupational Stress Questionnaire    Feeling of Stress : Not at all  Social Connections: Socially Isolated (06/02/2023)   Social Connection and Isolation Panel [NHANES]    Frequency of Communication with Friends and Family: More than three times a week    Frequency of Social Gatherings with Friends and Family: More than three times a week    Attends Religious Services: Never    Automotive engineer or Organizations: No    Attends  Club or Organization Meetings: Never    Marital Status: Widowed    Tobacco Counseling Counseling given: Not Answered   Clinical Intake:  Pre-visit preparation completed: Yes  Pain : No/denies pain     BMI - recorded: 21.61 Nutritional Status: BMI of 19-24  Normal Nutritional Risks: None  How often do you need to have someone help you when you read instructions, pamphlets, or other written materials from your doctor or pharmacy?: 1 - Never  Interpreter Needed?: No  Information entered by :: Lanier Ensign, LPN   Activities of Daily Living    06/02/2023   12:40 PM  In your present state of health, do you have any difficulty performing the following activities:  Hearing? 0  Vision? 0  Difficulty concentrating or making decisions? 0  Walking or climbing stairs? 1  Comment assistance from neighbors  Dressing or bathing? 0  Doing errands, shopping? 1  Preparing Food and eating ? N  Using the Toilet? N  In the past six months, have you accidently leaked urine? Y  Comment wears pads  Do you have problems with loss of bowel control? N  Managing your Medications? N  Managing your Finances? N  Housekeeping or managing your Housekeeping? Y    Patient Care Team: Natalia Leatherwood, DO as PCP - General (Family Medicine) Meriam Sprague, MD as PCP - Cardiology (Cardiology) Marinus Maw, MD as PCP - Electrophysiology (Cardiology) Storm Frisk, MD as Attending Physician (Pulmonary Disease) Charlsie Merles Kirstie Peri, DPM as Consulting Physician (Podiatry) Lars Masson, MD as Consulting Physician (Cardiology) Becky Augusta, MD (Alternative Medicine) Ditty, Loura Halt, MD as Consulting Physician (Neurosurgery) Genia Del, MD as Consulting Physician (Obstetrics and Gynecology) Casimer Leek (Dentistry) Cherlyn Roberts, MD as Consulting Physician (Dermatology) Drema Dallas, DO as Consulting Physician  (Neurology) Nelson Chimes, MD as Consulting Physician (Ophthalmology)  Indicate any recent Medical Services you may have received from other than [facility] providers in the past year (date may be approximate).     Assessment:   This is a routine wellness examination for [facility].  Hearing/Vision screen Hearing Screening - Comments:: Pt denies any hearing issues  Vision Screening - Comments:: Pt follows up with [facility] for annual eye exams   Dietary issues and exercise activities discussed:     Goals Addressed             This Visit's Progress    Patient Stated       To be able to use the cane        Depression Screen    06/02/2023    2:40 PM 08/12/2022   12:02 PM 05/20/2022    2:52 PM 02/04/2022    1:28 PM 05/14/2021    3:04 PM 02/06/2021    2:46 PM 12/18/2019    4:16 PM  PHQ 2/9 Scores  PHQ - 2 Score 0 0 0 0 0 0 0    Fall Risk    06/02/2023   12:40 PM 08/12/2022   12:02 PM 05/20/2022    2:54 PM 05/14/2021    3:06 PM 02/06/2021    2:46 PM  Fall Risk   Falls in the past year? 0 0 1 0 0  Number falls in past yr: 0 0 1 0 0  Injury with Fall? 0 0 1 0 0  Comment   completed rehab    Risk for fall due to : Impaired vision Impaired balance/gait;Impaired mobility;Orthopedic patient;Medication side effect Impaired vision;Impaired mobility Impaired balance/gait  Follow up Falls prevention discussed Falls evaluation completed Falls prevention discussed Falls evaluation completed;Falls prevention discussed     MEDICARE RISK AT HOME:   TIMED UP AND GO:  Was the test performed?  No    Cognitive Function:        06/02/2023    2:43 PM 05/20/2022    2:56 PM  6CIT Screen  What Year? 0 points 0 points  What month? 0 points 0 points  What time? 0 points 0 points  Count back from 20 0 points 0 points  Months in reverse 0 points 0 points  Repeat phrase 0 points 0 points  Total Score 0 points 0 points    Immunizations Immunization History  Administered Date(s) Administered    Td 10/21/2003   Tdap 02/17/2011    TDAP status: Due, Education has been provided regarding the importance of this vaccine. Advised may receive this vaccine at local pharmacy or Health Dept. Aware to provide a copy of the vaccination record if obtained from local pharmacy or Health Dept. Verbalized acceptance and understanding.  Flu Vaccine status: Due, Education has been provided regarding the importance of this vaccine. Advised may receive this vaccine at local pharmacy or Health Dept. Aware to provide a copy of the vaccination record if obtained from local pharmacy or Health Dept. Verbalized acceptance and understanding.  Pneumococcal vaccine status: Declined,  Education has been provided regarding the importance of this vaccine but patient still declined. Advised may receive this vaccine at local pharmacy or Health Dept. Aware to provide a copy of the vaccination record if obtained from local pharmacy or Health Dept. Verbalized acceptance and understanding.   Covid-19 vaccine status: Declined, Education has been provided regarding the importance of this vaccine but patient still declined. Advised may receive this vaccine at local pharmacy or Health Dept.or vaccine clinic. Aware to provide a copy of the vaccination record if obtained from local pharmacy or Health Dept. Verbalized acceptance and understanding.  Qualifies for Shingles Vaccine? No    Screening Tests Health Maintenance  Topic Date Due   DTaP/Tdap/Td (3 - Td or Tdap) 02/16/2021   Fecal DNA (Cologuard)  10/23/2022   INFLUENZA VACCINE  05/20/2023   MAMMOGRAM  08/08/2023   Medicare Annual Wellness (AWV)  06/01/2024   DEXA SCAN  Completed   Hepatitis C Screening  Completed   HPV VACCINES  Aged Out   Pneumonia Vaccine 62+ Years old  Discontinued   COVID-19 Vaccine  Discontinued   Zoster Vaccines- Shingrix  Discontinued    Health Maintenance  Health Maintenance Due  Topic Date Due   DTaP/Tdap/Td (3 - Td or Tdap)  02/16/2021   Fecal DNA (Cologuard)  10/23/2022   INFLUENZA VACCINE  05/20/2023    Colorectal cancer screening: Referral to GI placed 06/02/23. Pt aware the office will call re: appt.  Mammogram status: Ordered 06/02/23. Pt provided with contact info and advised to call to schedule appt.   Bone Density status: Completed 08/07/21. Results reflect: Bone density results: OSTEOPOROSIS. Repeat every 2 years.  Additional Screening:  Hepatitis C Screening:  Completed 10/19/13  Vision Screening: Recommended annual ophthalmology exams for early detection of glaucoma and other disorders of the eye. Is the patient up to date with their annual eye exam?  Yes  Who is the provider or what is the name of the office in which the patient attends annual eye exams? [facility]  If pt is not established with a provider, would they like to be referred to a  provider to establish care? No .   Dental Screening: Recommended annual dental exams for proper oral hygiene    Community Resource Referral / Chronic Care Management: CRR required this visit?  No   CCM required this visit?  No     Plan:     I have personally reviewed and noted the following in the patient's chart:   Medical and social history Use of alcohol, tobacco or illicit drugs  Current medications and supplements including opioid prescriptions. Patient is not currently taking opioid prescriptions. Functional ability and status Nutritional status Physical activity Advanced directives List of other physicians Hospitalizations, surgeries, and ER visits in previous 12 months Vitals Screenings to include cognitive, depression, and falls Referrals and appointments  In addition, I have reviewed and discussed with patient certain preventive protocols, quality metrics, and best practice recommendations. A written personalized care plan for preventive services as well as general preventive health recommendations were provided to patient.      Marzella Schlein, LPN   05/29/9146   After Visit Summary: (MyChart) Due to this being a telephonic visit, the after visit summary with patients personalized plan was offered to patient via MyChart   Nurse Notes: none

## 2023-06-02 NOTE — Patient Instructions (Addendum)
[name] [name] , Thank you for taking time to come for your Medicare Wellness Visit. I appreciate your ongoing commitment to your health goals. Please review the following plan we discussed and let me know if I can assist you in the future.   Referrals/Orders/Follow-Ups/Clinician Recommendations: get to using the cane  pt will follow up call to [facility] for appt  [phone]  This is a list of the screening recommended for you and due dates:  Health Maintenance  Topic Date Due   DTaP/Tdap/Td vaccine (3 - Td or Tdap) 02/16/2021   Cologuard (Stool DNA test)  10/23/2022   Medicare Annual Wellness Visit  05/21/2023   Flu Shot  05/20/2023   Mammogram  08/08/2023   DEXA scan (bone density measurement)  Completed   Hepatitis C Screening  Completed   HPV Vaccine  Aged Out   Pneumonia Vaccine  Discontinued   COVID-19 Vaccine  Discontinued   Zoster (Shingles) Vaccine  Discontinued    Advanced directives: (In Chart) A copy of your advanced directives are scanned into your chart should your provider ever need it.  Next Medicare Annual Wellness Visit scheduled for next year: Yes  Preventive Care 30 Years and Older, Female Preventive care refers to lifestyle choices and visits with your health care provider that can promote health and wellness. What does preventive care include? A yearly physical exam. This is also called an annual well check. Dental exams once or twice a year. Routine eye exams. Ask your health care provider how often you should have your eyes checked. Personal lifestyle choices, including: Daily care of your teeth and gums. Regular physical activity. Eating a healthy diet. Avoiding tobacco and drug use. Limiting alcohol use. Practicing safe sex. Taking low-dose aspirin every day. Taking vitamin and mineral supplements as recommended by your health care provider. What happens during an annual well check? The services and screenings done by your health care provider  during your annual well check will depend on your age, overall health, lifestyle risk factors, and family history of disease. Counseling  Your health care provider may ask you questions about your: Alcohol use. Tobacco use. Drug use. Emotional well-being. Home and relationship well-being. Sexual activity. Eating habits. History of falls. Memory and ability to understand (cognition). Work and work Astronomer. Reproductive health. Screening  You may have the following tests or measurements: Height, weight, and BMI. Blood pressure. Lipid and cholesterol levels. These may be checked every 5 years, or more frequently if you are over 16 years old. Skin check. Lung cancer screening. You may have this screening every year starting at age 53 if you have a 30-pack-year history of smoking and currently smoke or have quit within the past 15 years. Fecal occult blood test (FOBT) of the stool. You may have this test every year starting at age 80. Flexible sigmoidoscopy or colonoscopy. You may have a sigmoidoscopy every 5 years or a colonoscopy every 10 years starting at age 66. Hepatitis C blood test. Hepatitis B blood test. Sexually transmitted disease (STD) testing. Diabetes screening. This is done by checking your blood sugar (glucose) after you have not eaten for a while (fasting). You may have this done every 1-3 years. Bone density scan. This is done to screen for osteoporosis. You may have this done starting at age 28. Mammogram. This may be done every 1-2 years. Talk to your health care provider about how often you should have regular mammograms. Talk with your health care provider about your test results, treatment  options, and if necessary, the need for more tests. Vaccines  Your health care provider may recommend certain vaccines, such as: Influenza vaccine. This is recommended every year. Tetanus, diphtheria, and acellular pertussis (Tdap, Td) vaccine. You may need a Td booster every  10 years. Zoster vaccine. You may need this after age 75. Pneumococcal 13-valent conjugate (PCV13) vaccine. One dose is recommended after age 20. Pneumococcal polysaccharide (PPSV23) vaccine. One dose is recommended after age 52. Talk to your health care provider about which screenings and vaccines you need and how often you need them. This information is not intended to replace advice given to you by your health care provider. Make sure you discuss any questions you have with your health care provider. Document Released: 11/01/2015 Document Revised: 06/24/2016 Document Reviewed: 08/06/2015 Elsevier Interactive Patient Education  2017 [employer].  Fall Prevention in the Home Falls can cause injuries. They can happen to people of all ages. There are many things you can do to make your home safe and to help prevent falls. What can I do on the outside of my home? Regularly fix the edges of walkways and driveways and fix any cracks. Remove anything that might make you trip as you walk through a door, such as a raised step or threshold. Trim any bushes or trees on the path to your home. Use bright outdoor lighting. Clear any walking paths of anything that might make someone trip, such as rocks or tools. Regularly check to see if handrails are loose or broken. Make sure that both sides of any steps have handrails. Any raised decks and porches should have guardrails on the edges. Have any leaves, snow, or ice cleared regularly. Use sand or salt on walking paths during winter. Clean up any spills in your garage right away. This includes oil or grease spills. What can I do in the bathroom? Use night lights. Install grab bars by the toilet and in the tub and shower. Do not use towel bars as grab bars. Use non-skid mats or decals in the tub or shower. If you need to sit down in the shower, use a plastic, non-slip stool. Keep the floor dry. Clean up any water that spills on the floor as soon as it  happens. Remove soap buildup in the tub or shower regularly. Attach bath mats securely with double-sided non-slip rug tape. Do not have throw rugs and other things on the floor that can make you trip. What can I do in the bedroom? Use night lights. Make sure that you have a light by your bed that is easy to reach. Do not use any sheets or blankets that are too big for your bed. They should not hang down onto the floor. Have a firm chair that has side arms. You can use this for support while you get dressed. Do not have throw rugs and other things on the floor that can make you trip. What can I do in the kitchen? Clean up any spills right away. Avoid walking on wet floors. Keep items that you use a lot in easy-to-reach places. If you need to reach something above you, use a strong step stool that has a grab bar. Keep electrical cords out of the way. Do not use floor polish or wax that makes floors slippery. If you must use wax, use non-skid floor wax. Do not have throw rugs and other things on the floor that can make you trip. What can I do with my stairs? Do not  leave any items on the stairs. Make sure that there are handrails on both sides of the stairs and use them. Fix handrails that are broken or loose. Make sure that handrails are as long as the stairways. Check any carpeting to make sure that it is firmly attached to the stairs. Fix any carpet that is loose or worn. Avoid having throw rugs at the top or bottom of the stairs. If you do have throw rugs, attach them to the floor with carpet tape. Make sure that you have a light switch at the top of the stairs and the bottom of the stairs. If you do not have them, ask someone to add them for you. What else can I do to help prevent falls? Wear shoes that: Do not have high heels. Have rubber bottoms. Are comfortable and fit you well. Are closed at the toe. Do not wear sandals. If you use a stepladder: Make sure that it is fully opened.  Do not climb a closed stepladder. Make sure that both sides of the stepladder are locked into place. Ask someone to hold it for you, if possible. Clearly mark and make sure that you can see: Any grab bars or handrails. First and last steps. Where the edge of each step is. Use tools that help you move around (mobility aids) if they are needed. These include: Canes. Walkers. Scooters. Crutches. Turn on the lights when you go into a dark area. Replace any light bulbs as soon as they burn out. Set up your furniture so you have a clear path. Avoid moving your furniture around. If any of your floors are uneven, fix them. If there are any pets around you, be aware of where they are. Review your medicines with your doctor. Some medicines can make you feel dizzy. This can increase your chance of falling. Ask your doctor what other things that you can do to help prevent falls. This information is not intended to replace advice given to you by your health care provider. Make sure you discuss any questions you have with your health care provider. Document Released: 08/01/2009 Document Revised: 03/12/2016 Document Reviewed: 11/09/2014 Elsevier Interactive Patient Education  2017 [employer].

## 2023-06-04 DIAGNOSIS — I428 Other cardiomyopathies: Secondary | ICD-10-CM | POA: Diagnosis not present

## 2023-06-04 DIAGNOSIS — I5032 Chronic diastolic (congestive) heart failure: Secondary | ICD-10-CM | POA: Diagnosis not present

## 2023-06-04 DIAGNOSIS — G35 Multiple sclerosis: Secondary | ICD-10-CM | POA: Diagnosis not present

## 2023-06-04 DIAGNOSIS — J439 Emphysema, unspecified: Secondary | ICD-10-CM | POA: Diagnosis not present

## 2023-06-04 DIAGNOSIS — I480 Paroxysmal atrial fibrillation: Secondary | ICD-10-CM | POA: Diagnosis not present

## 2023-06-04 DIAGNOSIS — I110 Hypertensive heart disease with heart failure: Secondary | ICD-10-CM | POA: Diagnosis not present

## 2023-06-09 DIAGNOSIS — I428 Other cardiomyopathies: Secondary | ICD-10-CM | POA: Diagnosis not present

## 2023-06-09 DIAGNOSIS — J439 Emphysema, unspecified: Secondary | ICD-10-CM | POA: Diagnosis not present

## 2023-06-09 DIAGNOSIS — G35 Multiple sclerosis: Secondary | ICD-10-CM | POA: Diagnosis not present

## 2023-06-09 DIAGNOSIS — I480 Paroxysmal atrial fibrillation: Secondary | ICD-10-CM | POA: Diagnosis not present

## 2023-06-09 DIAGNOSIS — I110 Hypertensive heart disease with heart failure: Secondary | ICD-10-CM | POA: Diagnosis not present

## 2023-06-09 DIAGNOSIS — I5032 Chronic diastolic (congestive) heart failure: Secondary | ICD-10-CM | POA: Diagnosis not present

## 2023-06-14 ENCOUNTER — Ambulatory Visit: Payer: Self-pay

## 2023-06-14 DIAGNOSIS — Z5181 Encounter for therapeutic drug level monitoring: Secondary | ICD-10-CM

## 2023-06-14 DIAGNOSIS — I480 Paroxysmal atrial fibrillation: Secondary | ICD-10-CM | POA: Diagnosis not present

## 2023-06-14 DIAGNOSIS — I428 Other cardiomyopathies: Secondary | ICD-10-CM | POA: Diagnosis not present

## 2023-06-14 DIAGNOSIS — I5032 Chronic diastolic (congestive) heart failure: Secondary | ICD-10-CM | POA: Diagnosis not present

## 2023-06-14 DIAGNOSIS — G35 Multiple sclerosis: Secondary | ICD-10-CM | POA: Diagnosis not present

## 2023-06-14 DIAGNOSIS — I110 Hypertensive heart disease with heart failure: Secondary | ICD-10-CM | POA: Diagnosis not present

## 2023-06-14 DIAGNOSIS — J439 Emphysema, unspecified: Secondary | ICD-10-CM | POA: Diagnosis not present

## 2023-06-14 LAB — POCT INR: INR: 3.5 — AB (ref 2.0–3.0)

## 2023-06-14 NOTE — Patient Instructions (Signed)
 Description   Spoke with Tresa Endo PT [facility] and advised continue taking warfarin 1 tablet daily except for 1.5 tablets on Thursday and Saturday. Recheck INR in 4 weeks. [facility] [phone] or [phone]

## 2023-06-17 DIAGNOSIS — I480 Paroxysmal atrial fibrillation: Secondary | ICD-10-CM | POA: Diagnosis not present

## 2023-06-17 DIAGNOSIS — F419 Anxiety disorder, unspecified: Secondary | ICD-10-CM | POA: Diagnosis not present

## 2023-06-17 DIAGNOSIS — G35 Multiple sclerosis: Secondary | ICD-10-CM | POA: Diagnosis not present

## 2023-06-17 DIAGNOSIS — Z79899 Other long term (current) drug therapy: Secondary | ICD-10-CM | POA: Diagnosis not present

## 2023-06-17 DIAGNOSIS — I428 Other cardiomyopathies: Secondary | ICD-10-CM | POA: Diagnosis not present

## 2023-06-17 DIAGNOSIS — Z8673 Personal history of transient ischemic attack (TIA), and cerebral infarction without residual deficits: Secondary | ICD-10-CM | POA: Diagnosis not present

## 2023-06-17 DIAGNOSIS — Z9181 History of falling: Secondary | ICD-10-CM | POA: Diagnosis not present

## 2023-06-17 DIAGNOSIS — I272 Pulmonary hypertension, unspecified: Secondary | ICD-10-CM | POA: Diagnosis not present

## 2023-06-17 DIAGNOSIS — M436 Torticollis: Secondary | ICD-10-CM | POA: Diagnosis not present

## 2023-06-17 DIAGNOSIS — Z7901 Long term (current) use of anticoagulants: Secondary | ICD-10-CM | POA: Diagnosis not present

## 2023-06-17 DIAGNOSIS — J439 Emphysema, unspecified: Secondary | ICD-10-CM | POA: Diagnosis not present

## 2023-06-17 DIAGNOSIS — Z5982 Transportation insecurity: Secondary | ICD-10-CM | POA: Diagnosis not present

## 2023-06-17 DIAGNOSIS — E039 Hypothyroidism, unspecified: Secondary | ICD-10-CM | POA: Diagnosis not present

## 2023-06-17 DIAGNOSIS — D649 Anemia, unspecified: Secondary | ICD-10-CM | POA: Diagnosis not present

## 2023-06-17 DIAGNOSIS — N319 Neuromuscular dysfunction of bladder, unspecified: Secondary | ICD-10-CM | POA: Diagnosis not present

## 2023-06-17 DIAGNOSIS — I110 Hypertensive heart disease with heart failure: Secondary | ICD-10-CM | POA: Diagnosis not present

## 2023-06-17 DIAGNOSIS — Z952 Presence of prosthetic heart valve: Secondary | ICD-10-CM | POA: Diagnosis not present

## 2023-06-17 DIAGNOSIS — I493 Ventricular premature depolarization: Secondary | ICD-10-CM | POA: Diagnosis not present

## 2023-06-17 DIAGNOSIS — M4150 Other secondary scoliosis, site unspecified: Secondary | ICD-10-CM | POA: Diagnosis not present

## 2023-06-17 DIAGNOSIS — M810 Age-related osteoporosis without current pathological fracture: Secondary | ICD-10-CM | POA: Diagnosis not present

## 2023-06-17 DIAGNOSIS — I5032 Chronic diastolic (congestive) heart failure: Secondary | ICD-10-CM | POA: Diagnosis not present

## 2023-06-17 DIAGNOSIS — Z853 Personal history of malignant neoplasm of breast: Secondary | ICD-10-CM | POA: Diagnosis not present

## 2023-06-23 DIAGNOSIS — I480 Paroxysmal atrial fibrillation: Secondary | ICD-10-CM | POA: Diagnosis not present

## 2023-06-23 DIAGNOSIS — I110 Hypertensive heart disease with heart failure: Secondary | ICD-10-CM | POA: Diagnosis not present

## 2023-06-23 DIAGNOSIS — I428 Other cardiomyopathies: Secondary | ICD-10-CM | POA: Diagnosis not present

## 2023-06-23 DIAGNOSIS — I5032 Chronic diastolic (congestive) heart failure: Secondary | ICD-10-CM | POA: Diagnosis not present

## 2023-06-23 DIAGNOSIS — G35 Multiple sclerosis: Secondary | ICD-10-CM | POA: Diagnosis not present

## 2023-06-23 DIAGNOSIS — J439 Emphysema, unspecified: Secondary | ICD-10-CM | POA: Diagnosis not present

## 2023-06-24 ENCOUNTER — Other Ambulatory Visit: Payer: Self-pay | Admitting: *Deleted

## 2023-06-24 DIAGNOSIS — I480 Paroxysmal atrial fibrillation: Secondary | ICD-10-CM

## 2023-06-24 MED ORDER — WARFARIN SODIUM 5 MG PO TABS
ORAL_TABLET | ORAL | 1 refills | Status: DC
Start: 2023-06-24 — End: 2023-12-13
  Filled 2023-12-11: qty 7, 6d supply, fill #0

## 2023-06-24 NOTE — Telephone Encounter (Signed)
Pt requests a warfarin 5mg  refill S/P mitral valve replacement, S/P Maze operation for atrial fibrillation  Last INR 06/14/23 Last OV 07/15/22

## 2023-06-28 ENCOUNTER — Encounter (HOSPITAL_BASED_OUTPATIENT_CLINIC_OR_DEPARTMENT_OTHER): Payer: Self-pay | Admitting: Urology

## 2023-06-28 ENCOUNTER — Telehealth: Payer: Self-pay | Admitting: Family Medicine

## 2023-06-28 ENCOUNTER — Emergency Department (HOSPITAL_BASED_OUTPATIENT_CLINIC_OR_DEPARTMENT_OTHER)
Admission: EM | Admit: 2023-06-28 | Discharge: 2023-06-28 | Disposition: A | Discharge status: Home / Self Care | Payer: Medicare Other

## 2023-06-28 ENCOUNTER — Emergency Department (HOSPITAL_BASED_OUTPATIENT_CLINIC_OR_DEPARTMENT_OTHER): Payer: Medicare Other

## 2023-06-28 DIAGNOSIS — I10 Essential (primary) hypertension: Secondary | ICD-10-CM | POA: Diagnosis not present

## 2023-06-28 DIAGNOSIS — R079 Chest pain, unspecified: Secondary | ICD-10-CM | POA: Diagnosis not present

## 2023-06-28 DIAGNOSIS — R091 Pleurisy: Secondary | ICD-10-CM | POA: Diagnosis not present

## 2023-06-28 DIAGNOSIS — Z853 Personal history of malignant neoplasm of breast: Secondary | ICD-10-CM | POA: Diagnosis not present

## 2023-06-28 DIAGNOSIS — J984 Other disorders of lung: Secondary | ICD-10-CM | POA: Diagnosis not present

## 2023-06-28 DIAGNOSIS — I517 Cardiomegaly: Secondary | ICD-10-CM | POA: Diagnosis not present

## 2023-06-28 DIAGNOSIS — Z79899 Other long term (current) drug therapy: Secondary | ICD-10-CM | POA: Diagnosis not present

## 2023-06-28 LAB — CBC
HCT: 41.8 % (ref 36.0–46.0)
Hemoglobin: 14.1 g/dL (ref 12.0–15.0)
MCH: 32.2 pg (ref 26.0–34.0)
MCHC: 33.7 g/dL (ref 30.0–36.0)
MCV: 95.4 fL (ref 80.0–100.0)
Platelets: 260 10*3/uL (ref 150–400)
RBC: 4.38 MIL/uL (ref 3.87–5.11)
RDW: 12.8 % (ref 11.5–15.5)
WBC: 7.6 10*3/uL (ref 4.0–10.5)
nRBC: 0 % (ref 0.0–0.2)

## 2023-06-28 LAB — BASIC METABOLIC PANEL
Anion gap: 9 (ref 5–15)
BUN: 27 mg/dL — ABNORMAL HIGH (ref 8–23)
CO2: 26 mmol/L (ref 22–32)
Calcium: 9.3 mg/dL (ref 8.9–10.3)
Chloride: 101 mmol/L (ref 98–111)
Creatinine, Ser: 0.61 mg/dL (ref 0.44–1.00)
GFR, Estimated: >60 mL/min (ref >60)
Glucose, Bld: 124 mg/dL — ABNORMAL HIGH (ref 70–99)
Potassium: 4.1 mmol/L (ref 3.5–5.1)
Sodium: 136 mmol/L (ref 135–145)

## 2023-06-28 LAB — TROPONIN I (HIGH SENSITIVITY)
Troponin I (High Sensitivity): 12 ng/L (ref <18)
Troponin I (High Sensitivity): 14 ng/L (ref <18)

## 2023-06-28 NOTE — ED Triage Notes (Signed)
Pt states starting Saturday had sharp left sided chest pain that went away  No pain at this time,  2013 mitral valve replaced

## 2023-06-28 NOTE — ED Provider Notes (Signed)
EMERGENCY DEPARTMENT AT [facility] Provider Note   CSN: [id number] Arrival date & time: 06/28/23  1310     History  Chief Complaint  Patient presents with   Chest Pain    [name] [name] is a 72 y.o. female with past medical history of nonischemic cardiomyopathy, thyroid disease, breast cancer in remission, pulmonary hypertension, status post mitral valve replacement who presents to the ED complaining of chest pain that started 2 nights ago.  She states that this started around 7 PM Saturday night when she went to bend down to feed her cat.  She felt a sudden, sharp pain in the left side of her chest that persisted and was associated with breathing until 7 AM the following morning.  Pain has resolved since then.  No associated shortness of breath, cough, congestion, fever, leg pain or swelling, radiating pain, lightheadedness or dizziness, syncope, or other complaints.  No history of the symptoms.  Anticoagulated on warfarin and gets INR checks monthly.  No recent long distance travel, recent surgery, change in activity level, history of DVT/PE.  Currently pain-free.       Home Medications Prior to Admission medications   Medication Sig Start Date End Date Taking? Authorizing Provider  Alpha-Lipoic Acid 100 MG CAPS Take by mouth 3 (three) times daily.    [provider]  Biotin 10 MG CAPS Take 1 capsule by mouth 2 (two) times daily.    [provider]  calcium carbonate (OS-CAL - DOSED IN MG OF ELEMENTAL CALCIUM) 1250 (500 Ca) MG tablet Take 1 tablet by mouth. Taking 1,500 mg by mouth every morning    [provider]  Cholecalciferol (VITAMIN D3) 50 MCG (2000 UT) TABS Take 2,000-4,000 Units by mouth See admin instructions. Take one tablet (2000 units) by mouth every other day and take 2 tablets (4000 units) every other day    [provider]  Coenzyme Q10 (CO Q-10) 100 MG CAPS Take 100 mg by mouth every morning.    [provider]  Cranberry 500 MG CAPS Take 500 mg by mouth 2 (two) times daily with a meal.    [provider]  Cyanocobalamin (VITAMIN B-12) 1000 MCG SUBL Place 1,000 mcg under the tongue every morning.    [provider]  fluticasone (FLONASE) 50 MCG/ACT nasal spray Place 1 spray into both nostrils daily. Patient taking differently: Place 1 spray into both nostrils every morning. 04/29/21   Kuneff, Renee A, DO  glucosamine-chondroitin 500-400 MG tablet Take 1 tablet by mouth 2 (two) times daily with a meal.    [provider]  lisinopril (ZESTRIL) 5 MG tablet Take 1 tablet (5 mg total) by mouth daily. 07/21/22   Meriam Sprague, MD  Lysine 500 MG CAPS Take 1,000 mg by mouth 2 (two) times daily.    [provider]  Magnesium Citrate 100 MG TABS Take 200 mg by mouth every morning.    [provider]  metoprolol tartrate (LOPRESSOR) 25 MG tablet TAKE 1 TAB (25 MG) PO TWICE DAILY 08/13/22   Meriam Sprague, MD  Omega-3 Fatty Acids (FISH OIL) 1200 MG CAPS Take 1,200 mg by mouth 2 (two) times daily with a meal.    [provider]  progesterone (PROMETRIUM) 100 MG capsule Take 100 mg by mouth every morning.    [provider]  thyroid (ARMOUR) 90 MG tablet Take 90 mg by mouth every morning.    [provider]  triamcinolone  cream (KENALOG) 0.1 % Apply 1 Application topically 2 (two) times daily. 11/16/22   Louann Sjogren, DPM  Turmeric 500 MG CAPS Take 500 mg by mouth every evening.    [provider]  vitamin A 40981 UNIT capsule Take 10,000 Units by mouth every evening.    [provider]  vitamin E 400 UNIT capsule Take 400 Units by mouth every evening.    [provider]  warfarin (COUMADIN) 5 MG tablet Take 1 tablet by mouth daily except 1.5 tablets on Tuesdays and Thursdays or as directed Anticoagulation Clinic. 06/24/23   Pricilla Riffle, MD      Allergies    Erythromycin, Atorvastatin,  Lactose intolerance (gi), and Valsartan    Review of Systems   Review of Systems  All other systems reviewed and are negative.   Physical Exam Updated Vital Signs BP (!) 131/101 (BP Location: Left Arm)   Pulse 67   Temp 98.2 F (36.8 C) (Oral)   Resp 16   Ht 5\' 7"  (1.702 m)   Wt 62.6 kg   SpO2 97%   BMI 21.62 kg/m  Physical Exam Vitals and nursing note reviewed.  Constitutional:      General: She is not in acute distress.    Appearance: Normal appearance. She is not ill-appearing or toxic-appearing.  HENT:     Head: Normocephalic and atraumatic.     Mouth/Throat:     Mouth: Mucous membranes are moist.  Eyes:     Extraocular Movements: Extraocular movements intact.     Conjunctiva/sclera: Conjunctivae normal.     Pupils: Pupils are equal, round, and reactive to light.  Neck:     Vascular: No JVD.  Cardiovascular:     Rate and Rhythm: Normal rate and regular rhythm.  Pulmonary:     Effort: Pulmonary effort is normal. No tachypnea or respiratory distress.     Breath sounds: Normal breath sounds. No stridor. No decreased breath sounds, wheezing, rhonchi or rales.  Abdominal:     General: Abdomen is flat.     Palpations: Abdomen is soft. There is no mass.     Tenderness: There is no abdominal tenderness. There is no guarding or rebound.  Musculoskeletal:        General: Normal range of motion.     Cervical back: Normal range of motion and neck supple.     Right lower leg: No tenderness. No edema.     Left lower leg: No tenderness. No edema.  Skin:    General: Skin is warm and dry.     Capillary Refill: Capillary refill takes less than 2 seconds.  Neurological:     General: No focal deficit present.     Mental Status: She is alert and oriented to person, place, and time. Mental status is at baseline.  Psychiatric:        Mood and Affect: Mood normal.        Behavior: Behavior normal.     ED Results / Procedures / Treatments   Labs (all labs ordered are listed,  but only abnormal results are displayed) Labs Reviewed  BASIC METABOLIC PANEL - Abnormal; Notable for the following components:      Result Value   Glucose, Bld 124 (*)    BUN 27 (*)    All other components within normal limits  CBC  TROPONIN I (HIGH SENSITIVITY)  TROPONIN I (HIGH SENSITIVITY)    EKG EKG Interpretation Date/Time:  Monday June 28 2023 13:30:00 EDT Ventricular Rate:  70 PR Interval:  180 QRS Duration:  128 QT Interval:  401 QTC Calculation: 433 R Axis:   -18  Text Interpretation: Sinus rhythm Left ventricular hypertrophy Confirmed by Beckey Downing [phone]) on 06/28/2023 1:45:36 PM  Radiology DG Chest 2 View  Result Date: 06/28/2023 CLINICAL DATA:  Chest pain on the left EXAM: CHEST - 2 VIEW COMPARISON:  01/01/2022 FINDINGS: Mild cardiomegaly. Previous mitral valve replacement. Chronic pleural and parenchymal scarring at the right chest apex. Lungs are otherwise clear. No edema or effusion. Chronic surgical changes of the soft tissues of the right chest. IMPRESSION: No active disease. Chronic pleural and parenchymal scarring at the right chest apex. Previous mitral valve replacement. Electronically Signed   By: Paulina Fusi M.D.   On: 06/28/2023 15:42    Procedures Procedures    Medications Ordered in ED Medications - No data to display  ED Course/ Medical Decision Making/ A&P                                Medical Decision Making Amount and/or Complexity of Data Reviewed Labs: ordered. Decision-making details documented in ED Course. Radiology: ordered. Decision-making details documented in ED Course. ECG/medicine tests: ordered. Decision-making details documented in ED Course.   Medical Decision Making:   [name] [name] is a 72 y.o. female who presented to the ED today with chest pain detailed above.    Additional history discussed with patient's family/caregivers.  Patient's presentation is complicated by their history of mitral valve replacement,  advanced age.  Complete initial physical exam performed, notably the patient  was in NAD, nontoxic appearing, RRR, LCTA, no respiratory distress, abdomen soft and nontender, no LE edema.    Reviewed and confirmed nursing documentation for past medical history, family history, social history.    Initial Assessment:   With the patient's presentation of chest pain, the emergent differential diagnosis of chest pain includes: Acute coronary syndrome, pericarditis, aortic dissection, pulmonary embolism, tension pneumothorax, and esophageal rupture. Other urgent/non-acute considerations include, but are not limited to: chronic angina, aortic stenosis, cardiomyopathy, myocarditis, mitral valve prolapse, pulmonary hypertension, hypertrophic obstructive cardiomyopathy (HOCM), aortic insufficiency, right ventricular hypertrophy, pneumonia, pleuritis, bronchitis, pneumothorax, tumor, gastroesophageal reflux disease (GERD), esophageal spasm, Mallory-Weiss syndrome, peptic ulcer disease, biliary disease, pancreatitis, functional gastrointestinal pain, cervical or thoracic disk disease or arthritis, shoulder arthritis, costochondritis, subacromial bursitis, anxiety or panic attack, herpes zoster, breast disorders, chest wall tumors, thoracic outlet syndrome, mediastinitis.    Initial Plan:  Screening labs including CBC and Metabolic panel to evaluate for infectious or metabolic etiology of disease.  CXR to evaluate for structural/infectious intrathoracic pathology.  EKG and troponin to evaluate for cardiac pathology Objective evaluation as reviewed   Initial Study Results:   Laboratory  All laboratory results reviewed without evidence of clinically relevant pathology.   Exceptions include: Glucose 124, BUN 27  EKG EKG was reviewed independently. ST segments without concerns for elevations.   EKG: unchanged from previous tracings, normal sinus rhythm, LVH.   Radiology:  All images reviewed independently.  Agree with radiology report at this time.   DG Chest 2 View  Result Date: 06/28/2023 CLINICAL DATA:  Chest pain on the left EXAM: CHEST - 2 VIEW COMPARISON:  01/01/2022 FINDINGS: Mild cardiomegaly. Previous mitral valve replacement. Chronic pleural and parenchymal scarring at the right chest apex. Lungs are otherwise clear. No edema or effusion. Chronic surgical changes of the soft tissues of the right chest. IMPRESSION:  No active disease. Chronic pleural and parenchymal scarring at the right chest apex. Previous mitral valve replacement. Electronically Signed   By: Paulina Fusi M.D.   On: 06/28/2023 15:42      Final Assessment and Plan:   72 year old female presents to the ED with pleuritic type chest pain that started 2 nights ago.  All pain is resolved.  Currently asymptomatic.  No acute changes on EKG.  Vital signs reassuring.  Patient well-appearing.  No recent cough, congestion.  Does note some recent increase in activity the day before onset of symptoms and they also began after bending down to feed her cat.  No reproducible chest wall tenderness.  Negative Wells criteria.  EKG normal sinus rhythm.  Chest x-ray unremarkable.  Troponin normal x 2.  No other significant abnormalities in lab work.  Patient has remained asymptomatic throughout ED stay.  Neurologically intact.  Low suspicion for emergent cause of patient's chest pain today.  She does have an outpatient cardiologist secondary to history of mitral valve replacement.  She will schedule an outpatient follow-up appointment with them.  Strict ED return precautions given, all questions answered, and stable for discharge.   Clinical Impression:  1. Pleurisy      Discharge           Final Clinical Impression(s) / ED Diagnoses Final diagnoses:  Pleurisy    Rx / DC Orders ED Discharge Orders     None         Tonette Lederer, PA-C 06/28/23 1722    LongArlyss Repress, MD 06/28/23 [phone]

## 2023-06-28 NOTE — Telephone Encounter (Signed)
Pt currently at ED  

## 2023-06-28 NOTE — Discharge Instructions (Addendum)
Thank you for letting us take care of you today.   There were no changes on your EKG and both of your heart enzymes were normal. Your chest x-ray did not show any infection. There were no other significant findings in your labs.   Please follow-up with your PCP as well as her cardiologist to discuss your ED visit today and any continued symptoms.  If you develop new or worsening condition, return to the nearest ED for reevaluation.

## 2023-06-28 NOTE — Telephone Encounter (Signed)
[name] [name] called and reported having a heart episode on sat 9/7. She reports that she went to bend down to feed her cat/ place food in his bowl and she experienced an intense pinch on the left side of her chest. She says she has experienced this before. She suggested going to the med center to get accessed and I agreed with her, and she said she would go to the Med center today. She was not currently experiencing any symptoms or chest pain at all during the call or at all this morning. She wanted the advice of Dr. Claiborne Billings or a CMA. As previously stated she is going to the med center to get evaluated. She says that a CMA can still give her a call.  She did not seek medical attention on Sat when this occurred.

## 2023-06-29 DIAGNOSIS — N951 Menopausal and female climacteric states: Secondary | ICD-10-CM | POA: Diagnosis not present

## 2023-06-30 DIAGNOSIS — I5032 Chronic diastolic (congestive) heart failure: Secondary | ICD-10-CM | POA: Diagnosis not present

## 2023-06-30 DIAGNOSIS — I110 Hypertensive heart disease with heart failure: Secondary | ICD-10-CM | POA: Diagnosis not present

## 2023-06-30 DIAGNOSIS — J439 Emphysema, unspecified: Secondary | ICD-10-CM | POA: Diagnosis not present

## 2023-06-30 DIAGNOSIS — I480 Paroxysmal atrial fibrillation: Secondary | ICD-10-CM | POA: Diagnosis not present

## 2023-06-30 DIAGNOSIS — G35 Multiple sclerosis: Secondary | ICD-10-CM | POA: Diagnosis not present

## 2023-06-30 DIAGNOSIS — I428 Other cardiomyopathies: Secondary | ICD-10-CM | POA: Diagnosis not present

## 2023-07-05 ENCOUNTER — Telehealth: Payer: Self-pay

## 2023-07-05 NOTE — Telephone Encounter (Signed)
Spoke with patient regarding results/recommendations.  

## 2023-07-05 NOTE — Telephone Encounter (Signed)
Patient calling requesting to speak with [name] [name].  Patient was diagnosed with pleurisy. And she does not agree with diagnoses. Patient stated that she spoke to [name] last week and would like it very much to speak with her again.

## 2023-07-08 DIAGNOSIS — I5032 Chronic diastolic (congestive) heart failure: Secondary | ICD-10-CM | POA: Diagnosis not present

## 2023-07-08 DIAGNOSIS — J439 Emphysema, unspecified: Secondary | ICD-10-CM | POA: Diagnosis not present

## 2023-07-08 DIAGNOSIS — I428 Other cardiomyopathies: Secondary | ICD-10-CM | POA: Diagnosis not present

## 2023-07-08 DIAGNOSIS — G35 Multiple sclerosis: Secondary | ICD-10-CM | POA: Diagnosis not present

## 2023-07-08 DIAGNOSIS — I480 Paroxysmal atrial fibrillation: Secondary | ICD-10-CM | POA: Diagnosis not present

## 2023-07-08 DIAGNOSIS — I110 Hypertensive heart disease with heart failure: Secondary | ICD-10-CM | POA: Diagnosis not present

## 2023-07-13 ENCOUNTER — Ambulatory Visit (INDEPENDENT_AMBULATORY_CARE_PROVIDER_SITE_OTHER): Payer: Medicare Other | Admitting: Cardiovascular Disease

## 2023-07-13 ENCOUNTER — Telehealth: Payer: Self-pay

## 2023-07-13 DIAGNOSIS — I428 Other cardiomyopathies: Secondary | ICD-10-CM | POA: Diagnosis not present

## 2023-07-13 DIAGNOSIS — I110 Hypertensive heart disease with heart failure: Secondary | ICD-10-CM | POA: Diagnosis not present

## 2023-07-13 DIAGNOSIS — G35 Multiple sclerosis: Secondary | ICD-10-CM | POA: Diagnosis not present

## 2023-07-13 DIAGNOSIS — I5032 Chronic diastolic (congestive) heart failure: Secondary | ICD-10-CM | POA: Diagnosis not present

## 2023-07-13 DIAGNOSIS — I480 Paroxysmal atrial fibrillation: Secondary | ICD-10-CM | POA: Diagnosis not present

## 2023-07-13 DIAGNOSIS — Z952 Presence of prosthetic heart valve: Secondary | ICD-10-CM | POA: Diagnosis not present

## 2023-07-13 DIAGNOSIS — Z5181 Encounter for therapeutic drug level monitoring: Secondary | ICD-10-CM

## 2023-07-13 DIAGNOSIS — Z8679 Personal history of other diseases of the circulatory system: Secondary | ICD-10-CM

## 2023-07-13 DIAGNOSIS — J439 Emphysema, unspecified: Secondary | ICD-10-CM | POA: Diagnosis not present

## 2023-07-13 LAB — POCT INR: INR: 4.1 — AB (ref 2.0–3.0)

## 2023-07-13 NOTE — Telephone Encounter (Signed)
INR was due yesterday. Called Kelly with [facility] to determine when INR would be checked, no answer. Left message on voicemail.

## 2023-07-14 ENCOUNTER — Other Ambulatory Visit: Payer: Self-pay

## 2023-07-14 MED ORDER — LISINOPRIL 5 MG PO TABS: 5.0000 mg | ORAL_TABLET | Freq: Every day | ORAL | 0 refills | Status: DC

## 2023-07-15 ENCOUNTER — Telehealth: Payer: Self-pay | Admitting: Family Medicine

## 2023-07-15 NOTE — Telephone Encounter (Signed)
Lm to return call to discuss if needed

## 2023-07-15 NOTE — Telephone Encounter (Signed)
Approve

## 2023-07-15 NOTE — Telephone Encounter (Signed)
Caller/Agency [facility] Number: [phone] Requesting OT/PT/Skilled Nursing/Social Work/Speech Therapy: PT Frequency: 1 time a week for 8 weeks  Ok to leave verbal orders ok on Voicemail

## 2023-07-16 ENCOUNTER — Ambulatory Visit: Payer: Medicare Other | Admitting: Family Medicine

## 2023-07-17 DIAGNOSIS — I272 Pulmonary hypertension, unspecified: Secondary | ICD-10-CM | POA: Diagnosis not present

## 2023-07-17 DIAGNOSIS — M1990 Unspecified osteoarthritis, unspecified site: Secondary | ICD-10-CM | POA: Diagnosis not present

## 2023-07-17 DIAGNOSIS — K589 Irritable bowel syndrome without diarrhea: Secondary | ICD-10-CM | POA: Diagnosis not present

## 2023-07-17 DIAGNOSIS — Z8673 Personal history of transient ischemic attack (TIA), and cerebral infarction without residual deficits: Secondary | ICD-10-CM | POA: Diagnosis not present

## 2023-07-17 DIAGNOSIS — Z7901 Long term (current) use of anticoagulants: Secondary | ICD-10-CM | POA: Diagnosis not present

## 2023-07-17 DIAGNOSIS — I480 Paroxysmal atrial fibrillation: Secondary | ICD-10-CM | POA: Diagnosis not present

## 2023-07-17 DIAGNOSIS — I428 Other cardiomyopathies: Secondary | ICD-10-CM | POA: Diagnosis not present

## 2023-07-17 DIAGNOSIS — E039 Hypothyroidism, unspecified: Secondary | ICD-10-CM | POA: Diagnosis not present

## 2023-07-17 DIAGNOSIS — Z853 Personal history of malignant neoplasm of breast: Secondary | ICD-10-CM | POA: Diagnosis not present

## 2023-07-17 DIAGNOSIS — D649 Anemia, unspecified: Secondary | ICD-10-CM | POA: Diagnosis not present

## 2023-07-17 DIAGNOSIS — Z9181 History of falling: Secondary | ICD-10-CM | POA: Diagnosis not present

## 2023-07-17 DIAGNOSIS — Z5982 Transportation insecurity: Secondary | ICD-10-CM | POA: Diagnosis not present

## 2023-07-17 DIAGNOSIS — J439 Emphysema, unspecified: Secondary | ICD-10-CM | POA: Diagnosis not present

## 2023-07-17 DIAGNOSIS — M810 Age-related osteoporosis without current pathological fracture: Secondary | ICD-10-CM | POA: Diagnosis not present

## 2023-07-17 DIAGNOSIS — F419 Anxiety disorder, unspecified: Secondary | ICD-10-CM | POA: Diagnosis not present

## 2023-07-17 DIAGNOSIS — Z952 Presence of prosthetic heart valve: Secondary | ICD-10-CM | POA: Diagnosis not present

## 2023-07-17 DIAGNOSIS — I493 Ventricular premature depolarization: Secondary | ICD-10-CM | POA: Diagnosis not present

## 2023-07-17 DIAGNOSIS — I110 Hypertensive heart disease with heart failure: Secondary | ICD-10-CM | POA: Diagnosis not present

## 2023-07-17 DIAGNOSIS — Z79899 Other long term (current) drug therapy: Secondary | ICD-10-CM | POA: Diagnosis not present

## 2023-07-17 DIAGNOSIS — G35 Multiple sclerosis: Secondary | ICD-10-CM | POA: Diagnosis not present

## 2023-07-17 DIAGNOSIS — M436 Torticollis: Secondary | ICD-10-CM | POA: Diagnosis not present

## 2023-07-17 DIAGNOSIS — I5032 Chronic diastolic (congestive) heart failure: Secondary | ICD-10-CM | POA: Diagnosis not present

## 2023-07-17 DIAGNOSIS — M4150 Other secondary scoliosis, site unspecified: Secondary | ICD-10-CM | POA: Diagnosis not present

## 2023-07-17 DIAGNOSIS — N319 Neuromuscular dysfunction of bladder, unspecified: Secondary | ICD-10-CM | POA: Diagnosis not present

## 2023-07-19 ENCOUNTER — Telehealth: Payer: Self-pay | Admitting: Family Medicine

## 2023-07-19 DIAGNOSIS — N951 Menopausal and female climacteric states: Secondary | ICD-10-CM | POA: Diagnosis not present

## 2023-07-19 NOTE — Telephone Encounter (Signed)
[facility] certification and plan of care faxed along with episode summary report  to be filled out by provider. Patient requested to send it back via Fax within ASAP. Document is located in providers tray at front office.Please advise at Mobile [phone] (mobile) Attached with green pricing sheet place in providers in-box upfront.

## 2023-07-20 ENCOUNTER — Telehealth: Payer: Self-pay

## 2023-07-20 DIAGNOSIS — I5032 Chronic diastolic (congestive) heart failure: Secondary | ICD-10-CM | POA: Diagnosis not present

## 2023-07-20 DIAGNOSIS — J439 Emphysema, unspecified: Secondary | ICD-10-CM | POA: Diagnosis not present

## 2023-07-20 DIAGNOSIS — G35 Multiple sclerosis: Secondary | ICD-10-CM | POA: Diagnosis not present

## 2023-07-20 DIAGNOSIS — I493 Ventricular premature depolarization: Secondary | ICD-10-CM | POA: Diagnosis not present

## 2023-07-20 DIAGNOSIS — D649 Anemia, unspecified: Secondary | ICD-10-CM | POA: Diagnosis not present

## 2023-07-20 DIAGNOSIS — M4150 Other secondary scoliosis, site unspecified: Secondary | ICD-10-CM | POA: Diagnosis not present

## 2023-07-20 DIAGNOSIS — E039 Hypothyroidism, unspecified: Secondary | ICD-10-CM | POA: Diagnosis not present

## 2023-07-20 DIAGNOSIS — I110 Hypertensive heart disease with heart failure: Secondary | ICD-10-CM | POA: Diagnosis not present

## 2023-07-20 DIAGNOSIS — I480 Paroxysmal atrial fibrillation: Secondary | ICD-10-CM | POA: Diagnosis not present

## 2023-07-20 DIAGNOSIS — I272 Pulmonary hypertension, unspecified: Secondary | ICD-10-CM | POA: Diagnosis not present

## 2023-07-20 DIAGNOSIS — M810 Age-related osteoporosis without current pathological fracture: Secondary | ICD-10-CM | POA: Diagnosis not present

## 2023-07-20 DIAGNOSIS — I428 Other cardiomyopathies: Secondary | ICD-10-CM | POA: Diagnosis not present

## 2023-07-20 NOTE — Telephone Encounter (Signed)
Home health orders received 07/20/2023 for [name] [name] Home health initiation orders: No.  Home health re-certification orders: Yes. Patient last seen by ordering physician for this condition: yes (05/12/2023). Must be less than 90 days for re-certification and less than 30 days prior for initiation. Visit must have been for the condition the orders are being placed.  Patient meets criteria for Physician to sign orders: Yes.        Current med list has been attached: No        Orders placed on physicians desk for signature: 07/20/2023 (date) If patient does not meet criteria for orders to be signed: pt was called to schedule appt.   Felix Pacini, DO   Added nurse home health phrase.  Completed phrase and forms. 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    Phrase should be completed on all Home health forms.

## 2023-07-20 NOTE — Telephone Encounter (Signed)
I spoke with patient and left message for Kelly-PT [facility] in regards to checking INR.  The patient said that it should be done in the next few days.

## 2023-07-21 ENCOUNTER — Telehealth: Payer: Self-pay | Admitting: *Deleted

## 2023-07-21 NOTE — Telephone Encounter (Signed)
Spoke with Tresa Endo, [name] and she stated that the pt's INR will drawn on tomorrow and called into [place]. Will await and follow up.

## 2023-07-21 NOTE — Telephone Encounter (Signed)
Faxed

## 2023-07-22 ENCOUNTER — Ambulatory Visit: Payer: Medicare Other | Admitting: Family Medicine

## 2023-07-22 ENCOUNTER — Ambulatory Visit (INDEPENDENT_AMBULATORY_CARE_PROVIDER_SITE_OTHER): Payer: Medicare Other

## 2023-07-22 ENCOUNTER — Telehealth: Payer: Self-pay | Admitting: Family Medicine

## 2023-07-22 DIAGNOSIS — I5032 Chronic diastolic (congestive) heart failure: Secondary | ICD-10-CM | POA: Diagnosis not present

## 2023-07-22 DIAGNOSIS — I110 Hypertensive heart disease with heart failure: Secondary | ICD-10-CM | POA: Diagnosis not present

## 2023-07-22 DIAGNOSIS — I428 Other cardiomyopathies: Secondary | ICD-10-CM | POA: Diagnosis not present

## 2023-07-22 DIAGNOSIS — G35 Multiple sclerosis: Secondary | ICD-10-CM | POA: Diagnosis not present

## 2023-07-22 DIAGNOSIS — Z5181 Encounter for therapeutic drug level monitoring: Secondary | ICD-10-CM | POA: Diagnosis not present

## 2023-07-22 DIAGNOSIS — I480 Paroxysmal atrial fibrillation: Secondary | ICD-10-CM | POA: Diagnosis not present

## 2023-07-22 DIAGNOSIS — J439 Emphysema, unspecified: Secondary | ICD-10-CM | POA: Diagnosis not present

## 2023-07-22 LAB — POCT INR: INR: 2.6 (ref 2.0–3.0)

## 2023-07-22 NOTE — Patient Instructions (Signed)
Description   Spoke with Beth PT [facility] and advised to continue taking warfarin 1 tablet daily except for 1.5 tablets on Thursday and Saturday.  Recheck INR in 2 weeks.  [facility] [phone]

## 2023-07-22 NOTE — Telephone Encounter (Signed)
[facility] faxed orders , to be filled out by provider. Patient requested to send it back via Fax within ASAP. Document is located in providers tray at front office.Please advise at Mobile [phone] (mobile)  Placed in providers in basket up front.

## 2023-07-23 ENCOUNTER — Emergency Department (HOSPITAL_BASED_OUTPATIENT_CLINIC_OR_DEPARTMENT_OTHER)
Admission: EM | Admit: 2023-07-23 | Discharge: 2023-07-23 | Disposition: A | Discharge status: Home / Self Care | Payer: Medicare Other | Attending: Emergency Medicine | Admitting: Emergency Medicine

## 2023-07-23 ENCOUNTER — Emergency Department (HOSPITAL_BASED_OUTPATIENT_CLINIC_OR_DEPARTMENT_OTHER): Payer: Medicare Other

## 2023-07-23 ENCOUNTER — Encounter (HOSPITAL_BASED_OUTPATIENT_CLINIC_OR_DEPARTMENT_OTHER): Payer: Self-pay

## 2023-07-23 ENCOUNTER — Other Ambulatory Visit: Payer: Self-pay

## 2023-07-23 ENCOUNTER — Encounter: Payer: Medicare Other | Admitting: Family Medicine

## 2023-07-23 DIAGNOSIS — M11261 Other chondrocalcinosis, right knee: Secondary | ICD-10-CM | POA: Diagnosis not present

## 2023-07-23 DIAGNOSIS — I6782 Cerebral ischemia: Secondary | ICD-10-CM | POA: Diagnosis not present

## 2023-07-23 DIAGNOSIS — R9089 Other abnormal findings on diagnostic imaging of central nervous system: Secondary | ICD-10-CM | POA: Diagnosis not present

## 2023-07-23 DIAGNOSIS — W0110XA Fall on same level from slipping, tripping and stumbling with subsequent striking against unspecified object, initial encounter: Secondary | ICD-10-CM | POA: Diagnosis not present

## 2023-07-23 DIAGNOSIS — R58 Hemorrhage, not elsewhere classified: Secondary | ICD-10-CM | POA: Diagnosis not present

## 2023-07-23 DIAGNOSIS — J449 Chronic obstructive pulmonary disease, unspecified: Secondary | ICD-10-CM | POA: Insufficient documentation

## 2023-07-23 DIAGNOSIS — S80919A Unspecified superficial injury of unspecified knee, initial encounter: Secondary | ICD-10-CM | POA: Diagnosis not present

## 2023-07-23 DIAGNOSIS — Z853 Personal history of malignant neoplasm of breast: Secondary | ICD-10-CM | POA: Diagnosis not present

## 2023-07-23 DIAGNOSIS — Z7901 Long term (current) use of anticoagulants: Secondary | ICD-10-CM | POA: Diagnosis not present

## 2023-07-23 DIAGNOSIS — W19XXXA Unspecified fall, initial encounter: Secondary | ICD-10-CM | POA: Diagnosis not present

## 2023-07-23 DIAGNOSIS — S0990XA Unspecified injury of head, initial encounter: Secondary | ICD-10-CM | POA: Insufficient documentation

## 2023-07-23 DIAGNOSIS — E039 Hypothyroidism, unspecified: Secondary | ICD-10-CM | POA: Insufficient documentation

## 2023-07-23 DIAGNOSIS — Z7951 Long term (current) use of inhaled steroids: Secondary | ICD-10-CM | POA: Diagnosis not present

## 2023-07-23 DIAGNOSIS — I10 Essential (primary) hypertension: Secondary | ICD-10-CM | POA: Diagnosis not present

## 2023-07-23 DIAGNOSIS — Z8673 Personal history of transient ischemic attack (TIA), and cerebral infarction without residual deficits: Secondary | ICD-10-CM | POA: Insufficient documentation

## 2023-07-23 DIAGNOSIS — S199XXA Unspecified injury of neck, initial encounter: Secondary | ICD-10-CM | POA: Diagnosis not present

## 2023-07-23 DIAGNOSIS — M1711 Unilateral primary osteoarthritis, right knee: Secondary | ICD-10-CM | POA: Diagnosis not present

## 2023-07-23 DIAGNOSIS — M25561 Pain in right knee: Secondary | ICD-10-CM | POA: Diagnosis not present

## 2023-07-23 DIAGNOSIS — M25461 Effusion, right knee: Secondary | ICD-10-CM | POA: Diagnosis not present

## 2023-07-23 DIAGNOSIS — S8391XA Sprain of unspecified site of right knee, initial encounter: Secondary | ICD-10-CM | POA: Diagnosis not present

## 2023-07-23 NOTE — ED Triage Notes (Signed)
In for eval for mechanical fall from stepping off her stair lift at approx 0845 this am. Hit posterior head. Negative LOC. Denies headache or neck pain. Slight TTP occipital area. Right knee pain due to twisting motion. Denies blurred vision. Take coumadin daily.

## 2023-07-23 NOTE — ED Notes (Signed)
D/c paperwork reviewed with pt, including follow up care.  No questions or concerns voiced at time of d/c. . Pt verbalized understanding, Wheeled by ED staff to ED exit, NAD.   

## 2023-07-23 NOTE — Discharge Instructions (Signed)
The CT scan showed a potential fracture on your lower cervical spine of the transverse process.  Also potentially a compression fracture of T1.  Not specifically tender on this place more than baseline.  Follow-up with your doctors as needed.

## 2023-07-23 NOTE — Telephone Encounter (Signed)
Completed.

## 2023-07-23 NOTE — ED Notes (Signed)
Fall risk armband Fall risk sign on door Patient wearing sneekers

## 2023-07-23 NOTE — Telephone Encounter (Signed)
Home health orders received 07/22/23 for [name] Home health initiation orders: Yes.  Home health re-certification orders: No. Patient last seen by ordering physician for this condition: 05/12/23. Must be less than 90 days for re-certification and less than 30 days prior for initiation. Visit must have been for the condition the orders are being placed.  Patient meets criteria for Physician to sign orders: Yes.        Current med list has been attached: No        Orders placed on physicians desk for signature: 07/23/23 (date) If patient does not meet criteria for orders to be signed: pt was called to schedule appt. Appt is scheduled for N/A.   [name] [name]

## 2023-07-23 NOTE — Progress Notes (Signed)
err

## 2023-07-23 NOTE — ED Provider Notes (Signed)
[facility] Provider Note   CSN: [id number] Arrival date & time: 07/23/23  1028     History  Chief Complaint  Patient presents with   [name] [name]    [name] [name] is a 72 y.o. female.   Fall  Patient presents after mechanical fall.  States she was getting off a stair lift and her right knee gave out.  Complaining of right knee pain but fell and hitting her head.  Is on Coumadin due to mechanical valve.  Pain in the back of her head.  No neck pain.  Has had previous fracture of her right lower leg.  No chest or abdominal pain.    Past Medical History:  Diagnosis Date   Anxiety    Arthritis    knees   Breast cancer (HCC) 1999   COPD (chronic obstructive pulmonary disease) (HCC)    COPD with emphysema.. Assess by pulmonary team in the hospital April, 2013   GI bleed    GIB (gastrointestinal bleeding) 01/20/2022   Herpes    Hypothyroidism    IBS (irritable bowel syndrome)    Mitral valve regurgitation    Mitral valve replacement April, 2013, Mitral valve prolapse   Multiple sclerosis (HCC)    Neurogenic bladder    NICM (nonischemic cardiomyopathy) (HCC)    Osteoporosis    Ovarian cyst    Paroxysmal atrial fibrillation (HCC)    Rapid atrial fibrillation in-hospital, Rapid cardioversion,  before mitral valve surgery   Pulmonary hypertension (HCC)    Echo, April, 2013, before mitral valve surgery   PVC's (premature ventricular contractions)    S/P Maze operation for atrial fibrillation 01/26/2012   Complete biatrial lesion set using cryothermy via right mini thoracotomy   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 Authorizing Provider  Alpha-Lipoic Acid 100 MG CAPS Take by mouth 3 (three) times daily.    [provider]  Biotin 10 MG CAPS Take 1 capsule by mouth 2 (two) times daily.    [provider]  calcium carbonate (OS-CAL - DOSED IN MG OF ELEMENTAL CALCIUM) 1250 (500 Ca) MG tablet Take 1 tablet by mouth. Taking 1,500 mg by mouth every morning    [provider]  Cholecalciferol (VITAMIN D3) 50 MCG (2000 UT) TABS Take 2,000-4,000 Units by mouth See admin instructions. Take one tablet (2000 units) by mouth every other day and take 2 tablets (4000 units) every other day    [provider]  Coenzyme Q10 (CO Q-10) 100 MG CAPS Take 100 mg by mouth every morning.    [provider]  Cranberry 500 MG CAPS Take 500 mg by mouth 2 (two) times daily with a meal.    [provider]  Cyanocobalamin (VITAMIN B-12) 1000 MCG SUBL Place 1,000 mcg under the tongue every morning.    [provider]  fluticasone (FLONASE) 50 MCG/ACT nasal spray Place 1 spray into both nostrils daily. Patient taking differently: Place 1 spray into both nostrils every morning. 04/29/21   Kuneff, Renee A, DO  glucosamine-chondroitin 500-400 MG tablet Take 1 tablet by mouth 2 (two) times daily  with a meal.    [provider]  lisinopril (ZESTRIL) 5 MG tablet Take 1 tablet (5 mg total) by mouth daily. 07/14/23   Dunn, Dayna N, PA-C  Lysine 500 MG CAPS Take 1,000 mg by mouth 2 (two) times daily.    [provider]  Magnesium Citrate 100 MG TABS Take 200 mg by mouth every morning.    [provider]  metoprolol tartrate (LOPRESSOR) 25 MG tablet TAKE 1 TAB (25 MG) PO TWICE DAILY 08/13/22   Meriam Sprague, MD  Omega-3 Fatty Acids (FISH OIL) 1200 MG CAPS Take 1,200 mg by mouth 2 (two) times daily with a meal.    [provider]  progesterone (PROMETRIUM) 100 MG capsule Take 100 mg by mouth every morning.     [provider]  thyroid (ARMOUR) 90 MG tablet Take 90 mg by mouth every morning.    [provider]  triamcinolone cream (KENALOG) 0.1 % Apply 1 Application topically 2 (two) times daily. 11/16/22   Louann Sjogren, DPM  Turmeric 500 MG CAPS Take 500 mg by mouth every evening.    [provider]  vitamin A 24401 UNIT capsule Take 10,000 Units by mouth every evening.    [provider]  vitamin E 400 UNIT capsule Take 400 Units by mouth every evening.    [provider]  warfarin (COUMADIN) 5 MG tablet Take 1 tablet by mouth daily except 1.5 tablets on Tuesdays and Thursdays or as directed Anticoagulation Clinic. 06/24/23   Pricilla Riffle, MD      Allergies    Erythromycin, Atorvastatin, Lactose intolerance (gi), and Valsartan    Review of Systems   Review of Systems  Physical Exam Updated Vital Signs BP 133/68   Pulse 72   Temp 98.2 F (36.8 C)   Resp 15   Ht 5\' 7"  (1.702 m)   Wt 62.6 kg   SpO2 99%   BMI 21.62 kg/m  Physical Exam Nursing note reviewed.  HENT:     Head:     Comments: Occipital tenderness without deformity. Cardiovascular:     Rate and Rhythm: Regular rhythm.  Chest:     Chest wall: No tenderness.  Abdominal:     Tenderness: There is no abdominal tenderness.  Musculoskeletal:        General: Tenderness present.     Cervical back: Neck supple. No tenderness.     Comments: Tenderness to right knee medially.  No large deformity.  No large effusion.  Some pain with movement.  Neurological:     Mental Status: She is alert and oriented to person, place, and time.     ED Results / Procedures / Treatments   Labs (all labs ordered are listed, but only abnormal results are displayed) Labs Reviewed - No data to display  EKG None  Radiology DG Knee Complete 4 Views Right  Result Date: 07/23/2023 CLINICAL DATA:  Fall.  Right knee pain. EXAM: RIGHT KNEE - COMPLETE 4+ VIEW COMPARISON:  Right knee radiographs dated  January 06, 2022. FINDINGS: No significant focal soft tissue swelling. Small knee joint effusion. Partially visualized lateral plate and screw fixation construct of the proximal tibia. Visualized hardware appears intact without periprosthetic lucency. No acute fracture or dislocation. Degenerative changes of the knee with chondrocalcinosis. IMPRESSION: 1. No acute osseous abnormality. 2. 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 3. Partially visualized intact right proximal tibial fixation hardware. Electronically Signed   By: Maryan Char.D.  On: 07/23/2023 12:44   CT Head Wo Contrast  Result Date: 07/23/2023 CLINICAL DATA:  Head trauma, minor (Age >= 65y); Neck trauma (Age >= 65y) EXAM: CT HEAD WITHOUT CONTRAST CT CERVICAL SPINE WITHOUT CONTRAST TECHNIQUE: Multidetector CT imaging of the head and cervical spine was performed following the standard protocol without intravenous contrast. Multiplanar CT image reconstructions of the cervical spine were also generated. RADIATION DOSE REDUCTION: This exam was performed according to the departmental dose-optimization program which includes automated exposure control, adjustment of the mA and/or kV according to patient size and/or use of iterative reconstruction technique. COMPARISON:  MR C SPine 01/12/21, CTA head/neck 01/11/21 FINDINGS: CT HEAD FINDINGS Brain: No evidence of acute infarction, hemorrhage, hydrocephalus, extra-axial collection or mass lesion/mass effect. Generalized volume loss. Sequela of moderate chronic microvascular ischemic change. Vascular: No hyperdense vessel or unexpected calcification. Skull: Normal. Negative for fracture or focal lesion. Sinuses/Orbits: No middle ear or mastoid effusion. Paranasal sinuses clear. Orbits are unremarkable. Other: None. CT CERVICAL SPINE FINDINGS Alignment: Normal. Skull base and vertebrae: Compared to prior exam there is new mild superior endplate compression deformity  at T1. There is likely also a nondisplaced fracture through the left C7 transverse process. Soft tissues and spinal canal: No prevertebral fluid or swelling. No visible canal hematoma. Disc levels:  No evidence high-grade spinal canal stenosis. Upper chest: Redemonstrated consolidative opacity at the right lung apex, unchanged from 01/11/2021 Other: None IMPRESSION: 1. No acute intracranial abnormality. 2. Compared to prior exam there is new mild superior endplate compression deformity at T1. There is likely also a nondisplaced fracture through the left C7 transverse process. Electronically Signed   By: Lorenza Cambridge M.D.   On: 07/23/2023 12:15   CT Cervical Spine Wo Contrast  Result Date: 07/23/2023 CLINICAL DATA:  Head trauma, minor (Age >= 65y); Neck trauma (Age >= 65y) EXAM: CT HEAD WITHOUT CONTRAST CT CERVICAL SPINE WITHOUT CONTRAST TECHNIQUE: Multidetector CT imaging of the head and cervical spine was performed following the standard protocol without intravenous contrast. Multiplanar CT image reconstructions of the cervical spine were also generated. RADIATION DOSE REDUCTION: This exam was performed according to the departmental dose-optimization program which includes automated exposure control, adjustment of the mA and/or kV according to patient size and/or use of iterative reconstruction technique. COMPARISON:  MR C SPine 01/12/21, CTA head/neck 01/11/21 FINDINGS: CT HEAD FINDINGS Brain: No evidence of acute infarction, hemorrhage, hydrocephalus, extra-axial collection or mass lesion/mass effect. Generalized volume loss. Sequela of moderate chronic microvascular ischemic change. Vascular: No hyperdense vessel or unexpected calcification. Skull: Normal. Negative for fracture or focal lesion. Sinuses/Orbits: No middle ear or mastoid effusion. Paranasal sinuses clear. Orbits are unremarkable. Other: None. CT CERVICAL SPINE FINDINGS Alignment: Normal. Skull base and vertebrae: Compared to prior exam there is  new mild superior endplate compression deformity at T1. There is likely also a nondisplaced fracture through the left C7 transverse process. Soft tissues and spinal canal: No prevertebral fluid or swelling. No visible canal hematoma. Disc levels:  No evidence high-grade spinal canal stenosis. Upper chest: Redemonstrated consolidative opacity at the right lung apex, unchanged from 01/11/2021 Other: None IMPRESSION: 1. No acute intracranial abnormality. 2. Compared to prior exam there is new mild superior endplate compression deformity at T1. There is likely also a nondisplaced fracture through the left C7 transverse process. Electronically Signed   By: Lorenza Cambridge M.D.   On: 07/23/2023 12:15    Procedures Procedures    Medications Ordered in ED Medications - No data to display  ED Course/ Medical Decision Making/ A&P                                 Medical Decision Making Amount and/or Complexity of Data Reviewed Radiology: ordered.   Patient with fall.  Right knee pain and hit head.  Is on anticoagulation.  Will get head CT and cervical spine CT due to age and anticoagulation.  Will also get right knee x-ray.  Had INR checked yesterday and was therapeutic.  Head CT reassuring.  Knee also reassuring.  However cervical spine did show potential left C7 transverse process fracture.  Also potential T1 compression fracture.  Patient denies injury to this area.  Does have some mild tenderness in the area but states she is always tender there this is unchanged today.  Fractures could be old.  Either way has other providers who have been following compression fractures.  I think        Final Clinical Impression(s) / ED Diagnoses Final diagnoses:  Fall, initial encounter  Injury of head, initial encounter  Sprain of right knee, unspecified ligament, initial encounter    Rx / DC Orders ED Discharge Orders     None         Benjiman Core, MD 07/23/23 1309

## 2023-07-23 NOTE — ED Notes (Signed)
Pt transported to imaging.

## 2023-07-26 NOTE — Telephone Encounter (Signed)
Faxed

## 2023-07-27 DIAGNOSIS — J439 Emphysema, unspecified: Secondary | ICD-10-CM | POA: Diagnosis not present

## 2023-07-27 DIAGNOSIS — I5032 Chronic diastolic (congestive) heart failure: Secondary | ICD-10-CM | POA: Diagnosis not present

## 2023-07-27 DIAGNOSIS — G35 Multiple sclerosis: Secondary | ICD-10-CM | POA: Diagnosis not present

## 2023-07-27 DIAGNOSIS — I110 Hypertensive heart disease with heart failure: Secondary | ICD-10-CM | POA: Diagnosis not present

## 2023-07-27 DIAGNOSIS — I480 Paroxysmal atrial fibrillation: Secondary | ICD-10-CM | POA: Diagnosis not present

## 2023-07-27 DIAGNOSIS — I428 Other cardiomyopathies: Secondary | ICD-10-CM | POA: Diagnosis not present

## 2023-08-04 ENCOUNTER — Ambulatory Visit (INDEPENDENT_AMBULATORY_CARE_PROVIDER_SITE_OTHER): Payer: Self-pay | Admitting: Family Medicine

## 2023-08-04 DIAGNOSIS — Z91199 Patient's noncompliance with other medical treatment and regimen due to unspecified reason: Secondary | ICD-10-CM

## 2023-08-04 NOTE — Progress Notes (Signed)
NO SHOW

## 2023-08-05 ENCOUNTER — Ambulatory Visit (INDEPENDENT_AMBULATORY_CARE_PROVIDER_SITE_OTHER): Payer: Medicare Other | Admitting: *Deleted

## 2023-08-05 DIAGNOSIS — Z8679 Personal history of other diseases of the circulatory system: Secondary | ICD-10-CM | POA: Diagnosis not present

## 2023-08-05 DIAGNOSIS — I110 Hypertensive heart disease with heart failure: Secondary | ICD-10-CM | POA: Diagnosis not present

## 2023-08-05 DIAGNOSIS — Z952 Presence of prosthetic heart valve: Secondary | ICD-10-CM

## 2023-08-05 DIAGNOSIS — J439 Emphysema, unspecified: Secondary | ICD-10-CM | POA: Diagnosis not present

## 2023-08-05 DIAGNOSIS — Z5181 Encounter for therapeutic drug level monitoring: Secondary | ICD-10-CM | POA: Diagnosis not present

## 2023-08-05 DIAGNOSIS — Z9889 Other specified postprocedural states: Secondary | ICD-10-CM | POA: Diagnosis not present

## 2023-08-05 DIAGNOSIS — I428 Other cardiomyopathies: Secondary | ICD-10-CM | POA: Diagnosis not present

## 2023-08-05 DIAGNOSIS — G35 Multiple sclerosis: Secondary | ICD-10-CM | POA: Diagnosis not present

## 2023-08-05 DIAGNOSIS — I480 Paroxysmal atrial fibrillation: Secondary | ICD-10-CM | POA: Diagnosis not present

## 2023-08-05 DIAGNOSIS — I5032 Chronic diastolic (congestive) heart failure: Secondary | ICD-10-CM | POA: Diagnosis not present

## 2023-08-05 LAB — POCT INR: INR: 4.5 — AB (ref 2.0–3.0)

## 2023-08-05 NOTE — Patient Instructions (Addendum)
Description   Spoke with Beth PT [facility] and advised not to take any warfarin today and tomorrow take 1/2 tablet on Friday then continue taking warfarin 1 tablet daily except for 1.5 tablets on Thursday and Saturday. Recheck INR in 1 week.  Coumadin Clinic [phone]

## 2023-08-11 ENCOUNTER — Ambulatory Visit (INDEPENDENT_AMBULATORY_CARE_PROVIDER_SITE_OTHER): Payer: Self-pay | Admitting: *Deleted

## 2023-08-11 DIAGNOSIS — I5032 Chronic diastolic (congestive) heart failure: Secondary | ICD-10-CM | POA: Diagnosis not present

## 2023-08-11 DIAGNOSIS — J439 Emphysema, unspecified: Secondary | ICD-10-CM | POA: Diagnosis not present

## 2023-08-11 DIAGNOSIS — Z5181 Encounter for therapeutic drug level monitoring: Secondary | ICD-10-CM | POA: Diagnosis not present

## 2023-08-11 DIAGNOSIS — Z952 Presence of prosthetic heart valve: Secondary | ICD-10-CM

## 2023-08-11 DIAGNOSIS — G35 Multiple sclerosis: Secondary | ICD-10-CM | POA: Diagnosis not present

## 2023-08-11 DIAGNOSIS — I110 Hypertensive heart disease with heart failure: Secondary | ICD-10-CM | POA: Diagnosis not present

## 2023-08-11 DIAGNOSIS — I428 Other cardiomyopathies: Secondary | ICD-10-CM | POA: Diagnosis not present

## 2023-08-11 DIAGNOSIS — Z8679 Personal history of other diseases of the circulatory system: Secondary | ICD-10-CM | POA: Diagnosis not present

## 2023-08-11 DIAGNOSIS — Z9889 Other specified postprocedural states: Secondary | ICD-10-CM | POA: Diagnosis not present

## 2023-08-11 DIAGNOSIS — I480 Paroxysmal atrial fibrillation: Secondary | ICD-10-CM | POA: Diagnosis not present

## 2023-08-11 LAB — POCT INR: INR: 3.1 — AB (ref 2.0–3.0)

## 2023-08-11 NOTE — Patient Instructions (Signed)
Description   Spoke with Tresa Endo PT [facility] and advised to continue taking warfarin 1 tablet daily except for 1.5 tablets on Thursday and Saturday. Recheck INR in 2 weeks.  Coumadin Clinic [phone]

## 2023-08-12 ENCOUNTER — Telehealth: Payer: Self-pay | Admitting: Family Medicine

## 2023-08-12 DIAGNOSIS — H52222 Regular astigmatism, left eye: Secondary | ICD-10-CM | POA: Diagnosis not present

## 2023-08-12 DIAGNOSIS — H0102A Squamous blepharitis right eye, upper and lower eyelids: Secondary | ICD-10-CM | POA: Diagnosis not present

## 2023-08-12 DIAGNOSIS — H524 Presbyopia: Secondary | ICD-10-CM | POA: Diagnosis not present

## 2023-08-12 DIAGNOSIS — H0102B Squamous blepharitis left eye, upper and lower eyelids: Secondary | ICD-10-CM | POA: Diagnosis not present

## 2023-08-12 DIAGNOSIS — H5213 Myopia, bilateral: Secondary | ICD-10-CM | POA: Diagnosis not present

## 2023-08-12 DIAGNOSIS — H2513 Age-related nuclear cataract, bilateral: Secondary | ICD-10-CM | POA: Diagnosis not present

## 2023-08-12 DIAGNOSIS — H43811 Vitreous degeneration, right eye: Secondary | ICD-10-CM | POA: Diagnosis not present

## 2023-08-12 NOTE — Telephone Encounter (Signed)
[facility] faxed form , to be filled out by provider. Patient requested to send it back via Fax within ASAP. Document is located in providers tray at front office.Please advise at Mobile [phone] (mobile)

## 2023-08-16 DIAGNOSIS — I480 Paroxysmal atrial fibrillation: Secondary | ICD-10-CM | POA: Diagnosis not present

## 2023-08-16 DIAGNOSIS — Z853 Personal history of malignant neoplasm of breast: Secondary | ICD-10-CM | POA: Diagnosis not present

## 2023-08-16 DIAGNOSIS — I110 Hypertensive heart disease with heart failure: Secondary | ICD-10-CM | POA: Diagnosis not present

## 2023-08-16 DIAGNOSIS — Z8673 Personal history of transient ischemic attack (TIA), and cerebral infarction without residual deficits: Secondary | ICD-10-CM | POA: Diagnosis not present

## 2023-08-16 DIAGNOSIS — N319 Neuromuscular dysfunction of bladder, unspecified: Secondary | ICD-10-CM | POA: Diagnosis not present

## 2023-08-16 DIAGNOSIS — Z952 Presence of prosthetic heart valve: Secondary | ICD-10-CM | POA: Diagnosis not present

## 2023-08-16 DIAGNOSIS — Z9181 History of falling: Secondary | ICD-10-CM | POA: Diagnosis not present

## 2023-08-16 DIAGNOSIS — I5032 Chronic diastolic (congestive) heart failure: Secondary | ICD-10-CM | POA: Diagnosis not present

## 2023-08-16 DIAGNOSIS — K589 Irritable bowel syndrome without diarrhea: Secondary | ICD-10-CM | POA: Diagnosis not present

## 2023-08-16 DIAGNOSIS — M4150 Other secondary scoliosis, site unspecified: Secondary | ICD-10-CM | POA: Diagnosis not present

## 2023-08-16 DIAGNOSIS — J439 Emphysema, unspecified: Secondary | ICD-10-CM | POA: Diagnosis not present

## 2023-08-16 DIAGNOSIS — M436 Torticollis: Secondary | ICD-10-CM | POA: Diagnosis not present

## 2023-08-16 DIAGNOSIS — M810 Age-related osteoporosis without current pathological fracture: Secondary | ICD-10-CM | POA: Diagnosis not present

## 2023-08-16 DIAGNOSIS — E039 Hypothyroidism, unspecified: Secondary | ICD-10-CM | POA: Diagnosis not present

## 2023-08-16 DIAGNOSIS — F419 Anxiety disorder, unspecified: Secondary | ICD-10-CM | POA: Diagnosis not present

## 2023-08-16 DIAGNOSIS — I493 Ventricular premature depolarization: Secondary | ICD-10-CM | POA: Diagnosis not present

## 2023-08-16 DIAGNOSIS — Z79899 Other long term (current) drug therapy: Secondary | ICD-10-CM | POA: Diagnosis not present

## 2023-08-16 DIAGNOSIS — I272 Pulmonary hypertension, unspecified: Secondary | ICD-10-CM | POA: Diagnosis not present

## 2023-08-16 DIAGNOSIS — D649 Anemia, unspecified: Secondary | ICD-10-CM | POA: Diagnosis not present

## 2023-08-16 DIAGNOSIS — M1990 Unspecified osteoarthritis, unspecified site: Secondary | ICD-10-CM | POA: Diagnosis not present

## 2023-08-16 DIAGNOSIS — Z5982 Transportation insecurity: Secondary | ICD-10-CM | POA: Diagnosis not present

## 2023-08-16 DIAGNOSIS — I428 Other cardiomyopathies: Secondary | ICD-10-CM | POA: Diagnosis not present

## 2023-08-16 DIAGNOSIS — Z7901 Long term (current) use of anticoagulants: Secondary | ICD-10-CM | POA: Diagnosis not present

## 2023-08-16 DIAGNOSIS — G35 Multiple sclerosis: Secondary | ICD-10-CM | POA: Diagnosis not present

## 2023-08-17 ENCOUNTER — Telehealth: Payer: Self-pay | Admitting: Physician Assistant

## 2023-08-17 NOTE — Telephone Encounter (Signed)
Office calling in regards to a home health order they sent over and have not heard anything back. Please advise

## 2023-08-17 NOTE — Telephone Encounter (Signed)
Left message to call office

## 2023-08-18 ENCOUNTER — Other Ambulatory Visit (HOSPITAL_COMMUNITY): Payer: Self-pay

## 2023-08-18 ENCOUNTER — Other Ambulatory Visit: Payer: Self-pay | Admitting: Internal Medicine

## 2023-08-18 ENCOUNTER — Other Ambulatory Visit: Payer: Self-pay

## 2023-08-18 MED ORDER — METOPROLOL TARTRATE 25 MG PO TABS: ORAL_TABLET | ORAL | 0 refills | Status: DC

## 2023-08-18 MED ORDER — METOPROLOL TARTRATE 25 MG PO TABS
25.0000 mg | ORAL_TABLET | Freq: Two times a day (BID) | ORAL | 0 refills | Status: DC
  Filled 2023-08-18 – 2023-08-19 (×2): qty 60, 30d supply, fill #0

## 2023-08-19 ENCOUNTER — Other Ambulatory Visit (HOSPITAL_COMMUNITY): Payer: Self-pay

## 2023-08-19 ENCOUNTER — Telehealth: Payer: Self-pay | Admitting: Family Medicine

## 2023-08-19 ENCOUNTER — Other Ambulatory Visit (HOSPITAL_BASED_OUTPATIENT_CLINIC_OR_DEPARTMENT_OTHER): Payer: Self-pay

## 2023-08-19 DIAGNOSIS — I110 Hypertensive heart disease with heart failure: Secondary | ICD-10-CM | POA: Diagnosis not present

## 2023-08-19 DIAGNOSIS — G35 Multiple sclerosis: Secondary | ICD-10-CM | POA: Diagnosis not present

## 2023-08-19 DIAGNOSIS — I480 Paroxysmal atrial fibrillation: Secondary | ICD-10-CM | POA: Diagnosis not present

## 2023-08-19 DIAGNOSIS — I5032 Chronic diastolic (congestive) heart failure: Secondary | ICD-10-CM | POA: Diagnosis not present

## 2023-08-19 DIAGNOSIS — J439 Emphysema, unspecified: Secondary | ICD-10-CM | POA: Diagnosis not present

## 2023-08-19 DIAGNOSIS — I428 Other cardiomyopathies: Secondary | ICD-10-CM | POA: Diagnosis not present

## 2023-08-19 NOTE — Telephone Encounter (Signed)
[facility] faxed Physician order, to be filled out by provider. Patient requested to send it back via Fax within ASAP. Document is located in providers tray at front office.Please advise at Mobile [phone] (mobile)

## 2023-08-20 ENCOUNTER — Other Ambulatory Visit: Payer: Self-pay

## 2023-08-20 ENCOUNTER — Ambulatory Visit (INDEPENDENT_AMBULATORY_CARE_PROVIDER_SITE_OTHER): Payer: Medicare Other | Admitting: Family Medicine

## 2023-08-20 ENCOUNTER — Encounter: Payer: Self-pay | Admitting: Family Medicine

## 2023-08-20 ENCOUNTER — Other Ambulatory Visit (HOSPITAL_BASED_OUTPATIENT_CLINIC_OR_DEPARTMENT_OTHER): Payer: Self-pay

## 2023-08-20 VITALS — BP 124/60 | HR 70 | Temp 97.7°F | Ht 67.0 in | Wt 137.0 lb

## 2023-08-20 DIAGNOSIS — R531 Weakness: Secondary | ICD-10-CM

## 2023-08-20 DIAGNOSIS — G35 Multiple sclerosis: Secondary | ICD-10-CM

## 2023-08-20 DIAGNOSIS — Z9181 History of falling: Secondary | ICD-10-CM | POA: Diagnosis not present

## 2023-08-20 DIAGNOSIS — M4856XS Collapsed vertebra, not elsewhere classified, lumbar region, sequela of fracture: Secondary | ICD-10-CM | POA: Diagnosis not present

## 2023-08-20 NOTE — Progress Notes (Unsigned)
[name] [name] , August 09, 1951, 72 y.o., female MRN: [id number] Patient Care Team    Relationship Specialty Notifications Start End  Natalia Leatherwood, DO PCP - General Family Medicine  07/31/16   Meriam Sprague, MD (Inactive) PCP - Cardiology Cardiology  07/14/22   Marinus Maw, MD PCP - Electrophysiology Cardiology  07/14/22   Storm Frisk, MD Attending Physician Pulmonary Disease Abnormal results only, Admissions 03/03/12   Lenn Sink, DPM Consulting Physician Podiatry  08/11/16   Lars Masson, MD Consulting Physician Cardiology  08/11/16   Becky Augusta, MD  Alternative Medicine  08/11/16   Ditty, Loura Halt, MD Consulting Physician Neurosurgery  08/11/16   Genia Del, MD Consulting Physician Obstetrics and Gynecology  09/18/16   Casimer Leek  Dentistry  09/18/16   Cherlyn Roberts, MD Consulting Physician Dermatology  09/18/16   Drema Dallas, DO Consulting Physician Neurology  12/19/18   Nelson Chimes, MD Consulting Physician Ophthalmology  03/28/19    Comment: [place] [facility]    Chief Complaint  Patient presents with   Follow-up    ED visit for fall on 07/23/23. Patient had recent PT appt yesterday.     Subjective: [name] [name] is a 72 y.o. Pt presents to discuss recent ED visit for fall on 07/23/2023.  Patient has been working with physical therapy to help with her strength. She reports she has an area she has to maneuver around at the bottom of her steps, and she lost her balance and fell backwards.  She states she did not hit her head, there were books behind her that braced her fall. She also had right knee pain.  X-ray reassuring. CT did show old compression fractions, which she has neurology establishment. CT head reassuring no bleed. He has started a new supplement called Insta Flex, which she did stop the extra Coumadin.  When transferring between these 2 she did have some light vaginal bleeding.  She has had this in the past and is  established with gynecology.  She reports gynecology told her if it happens again to let it run its course.  She states the vaginal bleeding stopped after about 6-7 days of light spotting. She has been following routinely with cardiology, gynecology and podiatry.       06/02/2023    2:40 PM 08/12/2022   12:02 PM 05/20/2022    2:52 PM 02/04/2022    1:28 PM 05/14/2021    3:04 PM  Depression screen PHQ 2/9  Decreased Interest 0 0 0 0 0  Down, Depressed, Hopeless 0 0 0 0 0  PHQ - 2 Score 0 0 0 0 0    Allergies  Allergen Reactions   Erythromycin Nausea And Vomiting   Atorvastatin Other (See Comments)    Pt reports causes body to be stiff, joints ache, aching in knees and ankles, feels more fatigued.    Lactose Intolerance (Gi) Other (See Comments)    Upset stomach - yoghurt   Valsartan Other (See Comments)    Pt reports caused her depression   Social History   Social History Narrative   Patient is a widower (since 75), he currently lives with her son and daughter-in-law. She has 2 children.   She is college educated, and retired from the [employer].   She uses herbal remedies, and multiple over-the-counter supplements. She takes a daily vitamin.   She wears her seatbelt, exercises routinely, smoke detector in the home.  Requires a walker or wheelchair at times.   Feels safe in her relationships.   Past Medical History:  Diagnosis Date   Anxiety    Arthritis    knees   Breast cancer (HCC) 1999   COPD (chronic obstructive pulmonary disease) (HCC)    COPD with emphysema.. Assess by pulmonary team in the hospital April, 2013   GI bleed    GIB (gastrointestinal bleeding) 01/20/2022   Herpes    Hypothyroidism    IBS (irritable bowel syndrome)    Mitral valve regurgitation    Mitral valve replacement April, 2013, Mitral valve prolapse   Multiple sclerosis (HCC)    Neurogenic bladder    NICM (nonischemic cardiomyopathy) (HCC)    Osteoporosis    Ovarian cyst    Paroxysmal  atrial fibrillation (HCC)    Rapid atrial fibrillation in-hospital, Rapid cardioversion,  before mitral valve surgery   Pulmonary hypertension (HCC)    Echo, April, 2013, before mitral valve surgery   PVC's (premature ventricular contractions)    S/P Maze operation for atrial fibrillation 01/26/2012   Complete biatrial lesion set using cryothermy via right mini thoracotomy   S/P mitral valve replacement 01/26/2012   31mm Sorin [employer]omedics Optiform mechanical prosthesis via right mini thoracotomy   Stroke (cerebrum) (HCC)    Tibial plateau fracture, right 01/01/2022   Vertebral artery stenosis    right   Warfarin anticoagulation    Mechanical mitral prosthesis, April, 16109   Past Surgical History:  Procedure Laterality Date   BREAST LUMPECTOMY Right 1999   with sent.node, and axillary dissection (20)   CHEST TUBE INSERTION  01/26/2012   Procedure: CHEST TUBE INSERTION;  Surgeon: Purcell Nails, MD;  Location: [facility] OR;  Service: Open Heart Surgery;  Laterality: Left;   COLONOSCOPY  2010   "normal"   CYSTOSCOPY  1992   LAPAROSCOPIC OVARIAN CYSTECTOMY  1978   urethral stricture repair   LEFT AND RIGHT HEART CATHETERIZATION WITH CORONARY ANGIOGRAM N/A 01/20/2012   Procedure: LEFT AND RIGHT HEART CATHETERIZATION WITH CORONARY ANGIOGRAM;  Surgeon: Kathleene Hazel, MD;  Location: [facility] CATH LAB;  Service: Cardiovascular;  Laterality: N/A;   LYMPHADENECTOMY     MAZE  01/26/2012   Procedure: MAZE;  Surgeon: Purcell Nails, MD;  Location: [facility] OR;  Service: Open Heart Surgery;  Laterality: N/A;   MITRAL VALVE REPLACEMENT  01/26/2012   Procedure: MINIMALLY INVASIVE MITRAL VALVE (MV) REPLACEMENT;  Surgeon: Purcell Nails, MD;  Location: [facility] OR;  Service: Open Heart Surgery;  Laterality: Right;   ORIF TIBIA PLATEAU Right 01/05/2022   Procedure: OPEN REDUCTION INTERNAL FIXATION (ORIF) TIBIAL PLATEAU AND SHAFT;  Surgeon: Myrene Galas, MD;  Location: [facility] OR;  Service: Orthopedics;  Laterality: Right;    TEE WITHOUT CARDIOVERSION  01/19/2012   Procedure: TRANSESOPHAGEAL ECHOCARDIOGRAM (TEE);  Surgeon: Peter M Swaziland, MD;  Location: [facility] ENDOSCOPY;  Service: Cardiovascular;  Laterality: N/A;   TONSILLECTOMY  1970   UMBILICAL HERNIA REPAIR  1952   WRIST SURGERY Right 2012   Family History  Problem Relation Age of Onset   Heart disease Father        cardiac arrest    CAD Father    Hypertension Father    CAD Mother        5 stents and numerous bypass surgery   Hypertension Mother    Hyperlipidemia Mother    CAD Other    Breast cancer Maternal Grandmother    Breast cancer Maternal Aunt    Allergies as  of 08/20/2023       Reactions   Erythromycin Nausea And Vomiting   Atorvastatin Other (See Comments)   Pt reports causes body to be stiff, joints ache, aching in knees and ankles, feels more fatigued.    Lactose Intolerance (gi) Other (See Comments)   Upset stomach - yoghurt   Valsartan Other (See Comments)   Pt reports caused her depression        Medication List        Accurate as of August 20, 2023 11:59 PM. If you have any questions, ask your nurse or doctor.          Alpha-Lipoic Acid 100 MG Caps Take by mouth 3 (three) times daily.   Biotin 10 MG Caps Take 1 capsule by mouth 2 (two) times daily.   calcium carbonate 1250 (500 Ca) MG tablet Commonly known as: OS-CAL - dosed in mg of elemental calcium Take 1 tablet by mouth. Taking 1,500 mg by mouth every morning   Co Q-10 100 MG Caps Take 100 mg by mouth every morning.   Cranberry 500 MG Caps Take 500 mg by mouth 2 (two) times daily with a meal.   Fish Oil 1200 MG Caps Take 1,200 mg by mouth 2 (two) times daily with a meal.   fluticasone 50 MCG/ACT nasal spray Commonly known as: FLONASE Place 1 spray into both nostrils daily. What changed: when to take this   glucosamine-chondroitin 500-400 MG tablet Take 1 tablet by mouth 2 (two) times daily with a meal.   lisinopril 5 MG tablet Commonly known as:  ZESTRIL Take 1 tablet (5 mg total) by mouth daily.   Lysine 500 MG Caps Take 1,000 mg by mouth 2 (two) times daily.   Magnesium Citrate 100 MG Tabs Take 200 mg by mouth every morning.   metoprolol tartrate 25 MG tablet Commonly known as: LOPRESSOR Take 1 tablet (25 mg total) by mouth 2 (two) times daily.   OSTEO BI-FLEX ONE PER DAY PO Take 1 capsule by mouth daily.   progesterone 100 MG capsule Commonly known as: PROMETRIUM Take 100 mg by mouth every morning.   thyroid 90 MG tablet Commonly known as: ARMOUR Take 90 mg by mouth every morning.   triamcinolone cream 0.1 % Commonly known as: KENALOG Apply 1 Application topically 2 (two) times daily.   Turmeric 500 MG Caps Take 500 mg by mouth every evening.   vitamin A 16109 UNIT capsule Take 10,000 Units by mouth every evening.   Vitamin B-12 1000 MCG Subl Place 1,000 mcg under the tongue every morning.   Vitamin D3 50 MCG (2000 UT) Tabs Take 2,000-4,000 Units by mouth See admin instructions. Take one tablet (2000 units) by mouth every other day and take 2 tablets (4000 units) every other day   vitamin E 180 MG (400 UNITS) capsule Take 400 Units by mouth every evening.   warfarin 5 MG tablet Commonly known as: Coumadin Take as directed by the anticoagulation clinic. If you are unsure how to take this medication, talk to your nurse or doctor. Original instructions: Take 1 tablet by mouth daily except 1.5 tablets on Tuesdays and Thursdays or as directed Anticoagulation Clinic.        All past medical history, surgical history, allergies, family history, immunizations andmedications were updated in the EMR today and reviewed under the history and medication portions of their EMR.     ROS Negative, with the exception of above mentioned in HPI   Objective:  BP 124/60   Pulse 70   Temp 97.7 F (36.5 C)   Ht 5\' 7"  (1.702 m)   Wt 137 lb (62.1 kg)   SpO2 98%   BMI 21.46 kg/m  Body mass index is 21.46  kg/m. Physical Exam Vitals and nursing note reviewed.  Constitutional:      General: She is not in acute distress.    Appearance: Normal appearance. She is not toxic-appearing.  HENT:     Head: Normocephalic and atraumatic.  Eyes:     General: No scleral icterus.       Right eye: No discharge.        Left eye: No discharge.     Conjunctiva/sclera: Conjunctivae normal.  Pulmonary:     Effort: Pulmonary effort is normal.  Musculoskeletal:     Cervical back: Normal range of motion.  Skin:    Findings: No rash.  Neurological:     Mental Status: She is alert and oriented to person, place, and time. Mental status is at baseline.  Psychiatric:        Mood and Affect: Mood normal.        Behavior: Behavior normal.        Thought Content: Thought content normal.        Judgment: Judgment normal.    No results found. No results found. No results found. However, due to the size of the patient record, not all encounters were searched. Please check Results Review for a complete set of results.  Assessment/Plan: [name] [name] is a 72 y.o. female present for OV for  Multiple sclerosis (HCC)-scoliosis/torticollis/right-sided weakness/history of CVA/CHF/A-fib/warfarin anticoagulation/recent fall Continue with physical therapy.  She does feel like this has been helping.  They have worked on a fix to help her avoid future falls from the last step in her home.   Overall she feels home health has been helpful and would like to continue.    Reviewed expectations re: course of current medical issues. Discussed self-management of symptoms. Outlined signs and symptoms indicating need for more acute intervention. Patient verbalized understanding and all questions were answered. Patient received an After-Visit Summary.    No orders of the defined types were placed in this encounter.  No orders of the defined types were placed in this encounter.  Referral Orders  No referral(s) requested  today      Note is dictated utilizing voice recognition software. Although note has been proof read prior to signing, occasional typographical errors still can be missed. If any questions arise, please do not hesitate to call for verification.   electronically signed by:  Felix Pacini, DO  [facility] - OR

## 2023-08-20 NOTE — Patient Instructions (Signed)
 No follow-ups on file.        Great to see you today.  I have refilled the medication(s) we provide.   If labs were collected or images ordered, we will inform you of  results once we have received them and reviewed. We will contact you either by echart message, or telephone call.  Please give ample time to the testing facility, and our office to run,  receive and review results. Please do not call inquiring of results, even if you can see them in your chart. We will contact you as soon as we are able. If it has been over 1 week since the test was completed, and you have not yet heard from [place], then please call us.    - echart message- for normal results that have been seen by the patient already.   - telephone call: abnormal results or if patient has not viewed results in their echart.  If a referral to a specialist was entered for you, please call us in 2 weeks if you have not heard from the specialist office to schedule.

## 2023-08-20 NOTE — Telephone Encounter (Signed)
[facility] faxed forms back.

## 2023-08-20 NOTE — Telephone Encounter (Signed)
Completed forms x 2 today after patient appointment. Forms given to [facility]

## 2023-08-20 NOTE — Telephone Encounter (Signed)
Placed in PCP office. Pt aware that orders can not be signed prior to appt.   Home health orders received 10/31/2/24 for [name] [name]  Home health initiation orders: Yes.  Home health re-certification orders: No.  Patient last seen by ordering physician for this condition: n/a. Must be less than 90 days for re-certification and less than 30 days prior for initiation. Visit must have been for the condition the orders are being placed.  Patient meets criteria for Physician to sign orders: No.        Current med list has been attached: No        Orders placed on physicians desk for signature: 08/20/23 (date)  If patient does not meet criteria for orders to be signed: pt was called to schedule appt. Appt is scheduled for 08/20/23.

## 2023-08-24 NOTE — Telephone Encounter (Signed)
Addressed by PCP

## 2023-08-25 ENCOUNTER — Ambulatory Visit (INDEPENDENT_AMBULATORY_CARE_PROVIDER_SITE_OTHER): Payer: Medicare Other

## 2023-08-25 DIAGNOSIS — G35 Multiple sclerosis: Secondary | ICD-10-CM | POA: Diagnosis not present

## 2023-08-25 DIAGNOSIS — J439 Emphysema, unspecified: Secondary | ICD-10-CM | POA: Diagnosis not present

## 2023-08-25 DIAGNOSIS — I480 Paroxysmal atrial fibrillation: Secondary | ICD-10-CM | POA: Diagnosis not present

## 2023-08-25 DIAGNOSIS — I5032 Chronic diastolic (congestive) heart failure: Secondary | ICD-10-CM | POA: Diagnosis not present

## 2023-08-25 DIAGNOSIS — I110 Hypertensive heart disease with heart failure: Secondary | ICD-10-CM | POA: Diagnosis not present

## 2023-08-25 DIAGNOSIS — I428 Other cardiomyopathies: Secondary | ICD-10-CM | POA: Diagnosis not present

## 2023-08-25 DIAGNOSIS — Z5181 Encounter for therapeutic drug level monitoring: Secondary | ICD-10-CM | POA: Diagnosis not present

## 2023-08-25 LAB — POCT INR: INR: 4.4 — AB (ref 2.0–3.0)

## 2023-08-25 NOTE — Patient Instructions (Signed)
Description   Spoke with Waynetta Sandy, PT [facility] and patient. Advised for pt to HOLD today's dose and then START taking warfarin 1 tablet daily except for 1.5 tablets on Thursdays.  Stay consistent  Recheck INR in 2 weeks.  Coumadin Clinic [phone]

## 2023-08-26 ENCOUNTER — Telehealth: Payer: Self-pay | Admitting: Internal Medicine

## 2023-08-26 NOTE — Telephone Encounter (Signed)
[facility] called to repot that they are missing a signed order for the pts 06/14/23 INR... they are resending via fax a new order today.. I will forward to our anticoagulation clinic for review.

## 2023-08-26 NOTE — Telephone Encounter (Signed)
Fax received from [facility]. [address] office will have Dr Tenny Craw sign order.

## 2023-08-26 NOTE — Telephone Encounter (Signed)
[facility] is calling to follow up on an order that they sent to [place]. [facility] stated the orders came from Dr. Tenny Craw. The call back number is [phone]. Please advise.

## 2023-08-26 NOTE — Telephone Encounter (Signed)
Returned call and spoke with Olegario Messier at [facility]. Requested for order to be sent to Coumadin Clinic fax (#[phone]) so it can be signed by Dr Tenny Craw. Olegario Messier stated order will be resent shortly to provided fax number.

## 2023-09-01 DIAGNOSIS — I480 Paroxysmal atrial fibrillation: Secondary | ICD-10-CM | POA: Diagnosis not present

## 2023-09-01 DIAGNOSIS — G35 Multiple sclerosis: Secondary | ICD-10-CM | POA: Diagnosis not present

## 2023-09-01 DIAGNOSIS — J439 Emphysema, unspecified: Secondary | ICD-10-CM | POA: Diagnosis not present

## 2023-09-01 DIAGNOSIS — I110 Hypertensive heart disease with heart failure: Secondary | ICD-10-CM | POA: Diagnosis not present

## 2023-09-01 DIAGNOSIS — I428 Other cardiomyopathies: Secondary | ICD-10-CM | POA: Diagnosis not present

## 2023-09-01 DIAGNOSIS — I5032 Chronic diastolic (congestive) heart failure: Secondary | ICD-10-CM | POA: Diagnosis not present

## 2023-09-02 ENCOUNTER — Encounter: Payer: Self-pay | Admitting: Family Medicine

## 2023-09-08 ENCOUNTER — Ambulatory Visit (INDEPENDENT_AMBULATORY_CARE_PROVIDER_SITE_OTHER): Payer: Medicare Other | Admitting: *Deleted

## 2023-09-08 DIAGNOSIS — Z8679 Personal history of other diseases of the circulatory system: Secondary | ICD-10-CM | POA: Diagnosis not present

## 2023-09-08 DIAGNOSIS — I480 Paroxysmal atrial fibrillation: Secondary | ICD-10-CM | POA: Diagnosis not present

## 2023-09-08 DIAGNOSIS — Z952 Presence of prosthetic heart valve: Secondary | ICD-10-CM

## 2023-09-08 DIAGNOSIS — I5032 Chronic diastolic (congestive) heart failure: Secondary | ICD-10-CM | POA: Diagnosis not present

## 2023-09-08 DIAGNOSIS — I428 Other cardiomyopathies: Secondary | ICD-10-CM | POA: Diagnosis not present

## 2023-09-08 DIAGNOSIS — G35 Multiple sclerosis: Secondary | ICD-10-CM | POA: Diagnosis not present

## 2023-09-08 DIAGNOSIS — Z9889 Other specified postprocedural states: Secondary | ICD-10-CM

## 2023-09-08 DIAGNOSIS — I110 Hypertensive heart disease with heart failure: Secondary | ICD-10-CM | POA: Diagnosis not present

## 2023-09-08 DIAGNOSIS — Z5181 Encounter for therapeutic drug level monitoring: Secondary | ICD-10-CM

## 2023-09-08 DIAGNOSIS — J439 Emphysema, unspecified: Secondary | ICD-10-CM | POA: Diagnosis not present

## 2023-09-08 LAB — POCT INR: INR: 3.3 — AB (ref 2.0–3.0)

## 2023-09-08 NOTE — Patient Instructions (Signed)
Description   Spoke with Tresa Endo, PT [facility] and patient and advised to continue taking warfarin 1 tablet daily except for 1.5 tablets on Thursdays.  Stay consistent with leafy vegetables. Recheck INR in 3 weeks.  Coumadin Clinic [phone]

## 2023-09-14 ENCOUNTER — Ambulatory Visit: Payer: Medicare Other | Admitting: Internal Medicine

## 2023-09-18 ENCOUNTER — Other Ambulatory Visit: Payer: Self-pay | Admitting: Internal Medicine

## 2023-09-21 ENCOUNTER — Other Ambulatory Visit (HOSPITAL_BASED_OUTPATIENT_CLINIC_OR_DEPARTMENT_OTHER): Payer: Self-pay

## 2023-09-22 ENCOUNTER — Other Ambulatory Visit: Payer: Self-pay | Admitting: Internal Medicine

## 2023-09-22 ENCOUNTER — Encounter: Payer: Self-pay | Admitting: Physician Assistant

## 2023-09-22 ENCOUNTER — Other Ambulatory Visit: Payer: Self-pay | Admitting: Physician Assistant

## 2023-09-22 ENCOUNTER — Telehealth: Payer: Self-pay | Admitting: Family Medicine

## 2023-09-22 ENCOUNTER — Other Ambulatory Visit (HOSPITAL_COMMUNITY): Payer: Self-pay

## 2023-09-22 MED ORDER — METOPROLOL TARTRATE 25 MG PO TABS
25.0000 mg | ORAL_TABLET | Freq: Two times a day (BID) | ORAL | 0 refills | Status: DC
  Filled 2023-09-22 (×2): qty 180, 90d supply, fill #0

## 2023-09-22 NOTE — Telephone Encounter (Signed)
[facility] faxed form , to be filled out by provider. Patient requested to send it back via Fax within ASAP. Document is located in providers tray at front office.Please advise at Mobile [phone] (mobile)

## 2023-09-22 NOTE — Progress Notes (Signed)
Pharm entered verbal order for metoprolol. See doc note.

## 2023-09-22 NOTE — Progress Notes (Signed)
Received msg from pharmacist regarding metoprolol refill. Patient is due for f/u but missed appt 08/2023 due to heavy rains/weather, was not able to get appt until 11/18/23. Chart reviewed. Gave verbal OK for 90 day fill of metoprolol to allow time for her appt with buffer. Pharmacist took verbal order.

## 2023-09-29 ENCOUNTER — Ambulatory Visit: Payer: Medicare Other

## 2023-10-01 DIAGNOSIS — R5383 Other fatigue: Secondary | ICD-10-CM | POA: Diagnosis not present

## 2023-10-01 DIAGNOSIS — E039 Hypothyroidism, unspecified: Secondary | ICD-10-CM | POA: Diagnosis not present

## 2023-10-01 DIAGNOSIS — G35 Multiple sclerosis: Secondary | ICD-10-CM | POA: Diagnosis not present

## 2023-10-01 DIAGNOSIS — N951 Menopausal and female climacteric states: Secondary | ICD-10-CM | POA: Diagnosis not present

## 2023-10-01 DIAGNOSIS — R7989 Other specified abnormal findings of blood chemistry: Secondary | ICD-10-CM | POA: Diagnosis not present

## 2023-10-01 DIAGNOSIS — E559 Vitamin D deficiency, unspecified: Secondary | ICD-10-CM | POA: Diagnosis not present

## 2023-10-05 ENCOUNTER — Ambulatory Visit: Payer: Medicare Other

## 2023-10-05 ENCOUNTER — Ambulatory Visit: Payer: Medicare Other | Attending: Cardiology

## 2023-10-05 DIAGNOSIS — Z8679 Personal history of other diseases of the circulatory system: Secondary | ICD-10-CM | POA: Diagnosis not present

## 2023-10-05 DIAGNOSIS — Z5181 Encounter for therapeutic drug level monitoring: Secondary | ICD-10-CM | POA: Diagnosis not present

## 2023-10-05 DIAGNOSIS — N951 Menopausal and female climacteric states: Secondary | ICD-10-CM | POA: Diagnosis not present

## 2023-10-05 DIAGNOSIS — Z9889 Other specified postprocedural states: Secondary | ICD-10-CM | POA: Insufficient documentation

## 2023-10-05 DIAGNOSIS — Z952 Presence of prosthetic heart valve: Secondary | ICD-10-CM | POA: Diagnosis not present

## 2023-10-05 LAB — POCT INR: INR: 4.1 — AB (ref 2.0–3.0)

## 2023-10-05 NOTE — Patient Instructions (Signed)
HOLD TODAY ONLY THEN continue taking warfarin 1 tablet daily except for 1.5 tablets on Thursdays. Decrease to 1 cranberry supplement daily. Stay consistent with leafy vegetables. Recheck INR in 3 weeks.  Coumadin Clinic [phone]

## 2023-10-08 ENCOUNTER — Other Ambulatory Visit: Payer: Self-pay

## 2023-10-08 ENCOUNTER — Other Ambulatory Visit (HOSPITAL_COMMUNITY): Payer: Self-pay

## 2023-10-08 ENCOUNTER — Telehealth: Payer: Self-pay | Admitting: Internal Medicine

## 2023-10-08 MED ORDER — LISINOPRIL 5 MG PO TABS
5.0000 mg | ORAL_TABLET | Freq: Every day | ORAL | 0 refills | Status: DC
  Filled 2023-10-08 – 2023-10-11 (×2): qty 30, 30d supply, fill #0

## 2023-10-08 NOTE — Telephone Encounter (Signed)
*  STAT* If patient is at the pharmacy, call can be transferred to refill team.   1. Which medications need to be refilled? (please list name of each medication and dose if known) lisinopril (ZESTRIL) 5 MG tablet    2. Would you like to learn more about the convenience, safety, & potential cost savings by using the [facility]? N/A   3. Are you open to using the [facility] (Type [facility]. N/A   4. Which pharmacy/location (including street and city if local pharmacy) is medication to be sent to?  [id number] - [place], [place] - [address]     5. Do they need a 30 day or 90 day supply? 90 day    Patient only has 5 tablets left.

## 2023-10-11 ENCOUNTER — Other Ambulatory Visit (HOSPITAL_COMMUNITY): Payer: Self-pay

## 2023-10-11 DIAGNOSIS — N951 Menopausal and female climacteric states: Secondary | ICD-10-CM | POA: Diagnosis not present

## 2023-10-21 DIAGNOSIS — N951 Menopausal and female climacteric states: Secondary | ICD-10-CM | POA: Diagnosis not present

## 2023-10-26 ENCOUNTER — Ambulatory Visit: Payer: Medicare Other | Attending: Cardiology | Admitting: *Deleted

## 2023-10-26 DIAGNOSIS — Z952 Presence of prosthetic heart valve: Secondary | ICD-10-CM | POA: Insufficient documentation

## 2023-10-26 DIAGNOSIS — Z8679 Personal history of other diseases of the circulatory system: Secondary | ICD-10-CM | POA: Diagnosis not present

## 2023-10-26 DIAGNOSIS — Z5181 Encounter for therapeutic drug level monitoring: Secondary | ICD-10-CM | POA: Insufficient documentation

## 2023-10-26 DIAGNOSIS — Z9889 Other specified postprocedural states: Secondary | ICD-10-CM | POA: Diagnosis not present

## 2023-10-26 LAB — POCT INR: INR: 2.5 (ref 2.0–3.0)

## 2023-10-26 NOTE — Patient Instructions (Addendum)
 Description   Continue taking warfarin 1 tablet daily except for 1.5 tablets on Thursdays. Continue taking 1 cranberry supplement daily. Stay consistent with leafy vegetables. Recheck INR in 4 weeks.  Coumadin Clinic [phone]

## 2023-10-27 DIAGNOSIS — Z1211 Encounter for screening for malignant neoplasm of colon: Secondary | ICD-10-CM | POA: Diagnosis not present

## 2023-11-02 LAB — COLOGUARD: COLOGUARD: NEGATIVE

## 2023-11-09 ENCOUNTER — Other Ambulatory Visit: Payer: Self-pay | Admitting: Nurse Practitioner

## 2023-11-09 ENCOUNTER — Other Ambulatory Visit (HOSPITAL_BASED_OUTPATIENT_CLINIC_OR_DEPARTMENT_OTHER): Payer: Self-pay

## 2023-11-11 ENCOUNTER — Other Ambulatory Visit (HOSPITAL_BASED_OUTPATIENT_CLINIC_OR_DEPARTMENT_OTHER): Payer: Self-pay

## 2023-11-11 ENCOUNTER — Other Ambulatory Visit (HOSPITAL_COMMUNITY): Payer: Self-pay

## 2023-11-11 MED ORDER — LISINOPRIL 5 MG PO TABS
5.0000 mg | ORAL_TABLET | Freq: Every day | ORAL | 0 refills | Status: DC
  Filled 2023-11-11 (×2): qty 30, 30d supply, fill #0

## 2023-11-12 ENCOUNTER — Other Ambulatory Visit (HOSPITAL_BASED_OUTPATIENT_CLINIC_OR_DEPARTMENT_OTHER): Payer: Self-pay

## 2023-11-18 ENCOUNTER — Encounter: Payer: Self-pay | Admitting: Nurse Practitioner

## 2023-11-18 ENCOUNTER — Ambulatory Visit: Payer: Medicare Other | Attending: Nurse Practitioner | Admitting: Nurse Practitioner

## 2023-11-18 VITALS — BP 132/68 | HR 67 | Ht 67.0 in | Wt 134.4 lb

## 2023-11-18 DIAGNOSIS — Z9889 Other specified postprocedural states: Secondary | ICD-10-CM | POA: Diagnosis not present

## 2023-11-18 DIAGNOSIS — Z952 Presence of prosthetic heart valve: Secondary | ICD-10-CM | POA: Insufficient documentation

## 2023-11-18 DIAGNOSIS — E785 Hyperlipidemia, unspecified: Secondary | ICD-10-CM | POA: Insufficient documentation

## 2023-11-18 DIAGNOSIS — Z8679 Personal history of other diseases of the circulatory system: Secondary | ICD-10-CM | POA: Insufficient documentation

## 2023-11-18 DIAGNOSIS — I10 Essential (primary) hypertension: Secondary | ICD-10-CM | POA: Insufficient documentation

## 2023-11-18 DIAGNOSIS — Z8673 Personal history of transient ischemic attack (TIA), and cerebral infarction without residual deficits: Secondary | ICD-10-CM | POA: Diagnosis not present

## 2023-11-18 MED ORDER — LISINOPRIL 5 MG PO TABS: 5.0000 mg | ORAL_TABLET | Freq: Every day | ORAL | 3 refills | Status: DC

## 2023-11-18 MED ORDER — METOPROLOL TARTRATE 25 MG PO TABS
25.0000 mg | ORAL_TABLET | Freq: Two times a day (BID) | ORAL | 3 refills | Status: AC
  Filled 2023-12-11 – 2024-03-16 (×2): qty 180, 90d supply, fill #0
  Filled 2024-06-28: qty 180, 90d supply, fill #1
  Filled 2024-09-27: qty 180, 90d supply, fill #2

## 2023-11-18 NOTE — Patient Instructions (Signed)
Medication Instructions:   Your physician recommends that you continue on your current medications as directed. Please refer to the Current Medication list given to you today.   *If you need a refill on your cardiac medications before your next appointment, please call your pharmacy*   Lab Work:  None ordered.  If you have labs (blood work) drawn today and your tests are completely normal, you will receive your results only by: MyChart Message (if you have MyChart) OR A paper copy in the mail If you have any lab test that is abnormal or we need to change your treatment, we will call you to review the results.   Testing/Procedures:  Your physician has requested that you have an echocardiogram. Echocardiography is a painless test that uses sound waves to create images of your heart. It provides your doctor with information about the size and shape of your heart and how well your heart's chambers and valves are working. This procedure takes approximately one hour. There are no restrictions for this procedure. Please do NOT wear cologne, perfume or lotions (deodorant is allowed). Please arrive 15 minutes prior to your appointment time.  Please note: We ask at that you not bring children with you during ultrasound (echo/ vascular) testing. Due to room size and safety concerns, children are not allowed in the ultrasound rooms during exams. Our front office staff cannot provide observation of children in our lobby area while testing is being conducted. An adult accompanying a patient to their appointment will only be allowed in the ultrasound room at the discretion of the ultrasound technician under special circumstances. We apologize for any inconvenience.    Follow-Up: At [facility], you and your health needs are our priority.  As part of our continuing mission to provide you with exceptional heart care, we have created designated Provider Care Teams.  These Care Teams include your  primary Cardiologist (physician) and Advanced Practice Providers (APPs -  Physician Assistants and Nurse Practitioners) who all work together to provide you with the care you need, when you need it.  We recommend signing up for the patient portal called "MyChart".  Sign up information is provided on this After Visit Summary.  MyChart is used to connect with patients for Virtual Visits (Telemedicine).  Patients are able to view lab/test results, encounter notes, upcoming appointments, etc.  Non-urgent messages can be sent to your provider as well.   To learn more about what you can do with MyChart, go to [email or web address].    Your next appointment:   6 month(s)  Provider:   Dietrich Pates, MD     Other Instructions  Your physician wants you to follow-up in: 6 months.  You will receive a reminder letter in the mail two months in advance. If you don't receive a letter, please call our office to schedule the follow-up appointment.    1st Floor: - Lobby - Registration  - Pharmacy  - Lab - Cafe  2nd Floor: - PV Lab - Diagnostic Testing (echo, CT, nuclear med)  3rd Floor: - Vacant  4th Floor: - TCTS (cardiothoracic surgery) - AFib Clinic - Structural Heart Clinic - Vascular Surgery  - Vascular Ultrasound  5th Floor: - HeartCare Cardiology (general and EP) - Clinical Pharmacy for coumadin, hypertension, lipid, weight-loss medications, and med management appointments    Valet parking services will be available as well.

## 2023-11-18 NOTE — Progress Notes (Signed)
Cardiology Office Note:  .   Date:  11/18/2023  ID:  [name] [name], DOB [birth year], MRN [id number] PCP: Natalia Leatherwood, DO  [facility] Providers Cardiologist:  Dietrich Pates, MD Electrophysiologist:  Lewayne Bunting, MD    Patient Profile: .      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 Myrtis Ser and Dr. Delton See, more recently by Dr. Ladona Ridgel and Dr. Shari Prows.  She had prior history of PAF and severe MR.  Preop cath 01/2012 showed no significant CAD.  She underwent maze and mechanical MV replacement 01/2012.  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  She had a stroke in 2022 which neurology felt was due to vertebral stenosis.  She had a fall with leg fractures 12/2021 and a GI bleed in 01/2022 felt due to fecal impaction versus sterocoral ulcer.   Last cardiology clinic visit was 07/15/2022 with Ronie Spies, PA. Prefers holistic approach to hyperlipidemia. She was encouraged to get labs but she preferred management by holistic team. Recommendation for echo for surveillance of MVR but she reported she was busy working with rehab and would call back to schedule.        History of Present Illness: .   [name] [name] is a very pleasant 73 y.o. female  who is here today for follow-up of mitral valve replacement. She is on coumadin for chronic anticoagulation for her mechanical valve. She reports she is feeling well, continues to get stronger following 3 months in rehab following multiple leg fractures from a fall. She also fell in October and bumped her head but no concussion or bleed on CT. she continues to use a knee brace and has installed a chairlift on her staircase at home  for safety.  Reports she is overall feeling well. She reports occasional shortness of breath, particularly when saying affirmations while walking down the stairs in the mornings. She also experiences occasional dizziness, especially when turning the head too fast. She denies chest pain, orthopnea, PND, edema, palpitations, presyncope, or syncope. She reports a single episode of atrial fibrillation in 2014, a year after her valve replacement surgery. The patient is currently on Lisinopril and takes several supplements, including one with red yeast rice.  Discussed the use of AI scribe software for clinical note transcription with the patient, who gave verbal consent to proceed.   ROS: See HPI       Studies Reviewed: .        Risk Assessment/Calculations:             Physical Exam:   VS:  BP 132/68   Pulse 67   Ht 5\' 7"  (1.702 m)   Wt 134 lb 6.4 oz (61 kg)   SpO2 99%   BMI 21.05 kg/m    Wt Readings from Last 3 Encounters:  11/18/23 134 lb 6.4 oz (61 kg)  08/20/23 137 lb (62.1 kg)  07/23/23 138 lb 0.1 oz (62.6 kg)    GEN: Well nourished, well developed in no acute distress NECK: No JVD; No carotid bruits CARDIAC: RRR, no murmurs, rubs, gallops RESPIRATORY:  Clear to auscultation without rales, wheezing or rhonchi  ABDOMEN: Soft, non-tender, non-distended EXTREMITIES:  No edema; No deformity     ASSESSMENT AND PLAN: .    Mitral valve: S/p mechanical MVR and MAZE by Dr. Cornelius Moras in 2013.  She is asymptomatic.  Reports some shortness of breath when coming down the stairs in the mornings and reciting her affirmations.  No chest pain, orthopnea, PND, presyncope, or syncope.  She continues to follow with Coumadin clinic.  No bleeding concerns. Continue SBE prophylaxis. We will update echocardiogram for evaluation of valve function given last echo in 2021.  Hypertension: BP is well controlled.  Stable renal function on labs completed 06/28/2023.  No medication changes today.  We will continue  lisinopril and metoprolol. Refills have been provided.   Hyperlipidemia LDL goal < 70: No recent lipid panel to review.  She advised that she prefers holistic management of hyperlipidemia.  LDL goal 70 or lower due to history of stroke.  I advised that higher LDL increases risk of ASCVD. She verbalized understanding. She takes red yeast rice supplement.  History of stroke: Felt secondary to vertebral stenosis.  She continues to have some physical limitation also due to history of multiple sclerosis and scoliosis. She is not on aspirin in the setting of anticoagulation with Coumadin. BP is well controlled. No acute concerns today.        Disposition:6 months with Dr. Tenny Craw (transfer from Dr. Shari Prows)  Signed, Eligha Bridegroom, NP-C

## 2023-11-23 ENCOUNTER — Ambulatory Visit: Payer: Medicare Other | Attending: Cardiology

## 2023-11-23 DIAGNOSIS — Z9889 Other specified postprocedural states: Secondary | ICD-10-CM | POA: Insufficient documentation

## 2023-11-23 DIAGNOSIS — Z5181 Encounter for therapeutic drug level monitoring: Secondary | ICD-10-CM | POA: Diagnosis not present

## 2023-11-23 DIAGNOSIS — Z8679 Personal history of other diseases of the circulatory system: Secondary | ICD-10-CM | POA: Insufficient documentation

## 2023-11-23 DIAGNOSIS — Z952 Presence of prosthetic heart valve: Secondary | ICD-10-CM | POA: Insufficient documentation

## 2023-11-23 LAB — POCT INR: INR: 4.3 — AB (ref 2.0–3.0)

## 2023-11-23 NOTE — Patient Instructions (Signed)
Hold today only then Continue taking warfarin 1 tablet daily except for 1.5 tablets on Thursdays. Continue taking 1 cranberry supplement daily. Stay consistent with leafy vegetables. Recheck INR in 2 weeks.  [facility] [phone]

## 2023-12-06 ENCOUNTER — Ambulatory Visit (HOSPITAL_COMMUNITY): Payer: Medicare Other

## 2023-12-07 ENCOUNTER — Ambulatory Visit: Payer: Medicare Other

## 2023-12-09 ENCOUNTER — Other Ambulatory Visit (HOSPITAL_COMMUNITY): Payer: Self-pay

## 2023-12-09 ENCOUNTER — Other Ambulatory Visit: Payer: Self-pay | Admitting: Nurse Practitioner

## 2023-12-09 ENCOUNTER — Other Ambulatory Visit: Payer: Self-pay | Admitting: Internal Medicine

## 2023-12-09 MED FILL — Lisinopril Tab 5 MG: ORAL | 90 days supply | Qty: 90 | Fill #0 | Status: CN

## 2023-12-10 ENCOUNTER — Other Ambulatory Visit (HOSPITAL_COMMUNITY): Payer: Self-pay

## 2023-12-10 MED FILL — Lisinopril Tab 5 MG: ORAL | 90 days supply | Qty: 90 | Fill #0 | Status: CN

## 2023-12-11 ENCOUNTER — Other Ambulatory Visit (HOSPITAL_COMMUNITY): Payer: Self-pay

## 2023-12-11 MED ORDER — THYROID 90 MG PO TABS
90.0000 mg | ORAL_TABLET | Freq: Every morning | ORAL | 5 refills | Status: DC
  Filled 2023-12-11: qty 30, 30d supply, fill #0
  Filled 2024-01-08: qty 30, 30d supply, fill #1
  Filled 2024-02-07: qty 30, 30d supply, fill #2
  Filled 2024-03-08: qty 30, 30d supply, fill #3

## 2023-12-13 ENCOUNTER — Other Ambulatory Visit: Payer: Self-pay

## 2023-12-13 ENCOUNTER — Other Ambulatory Visit (HOSPITAL_COMMUNITY): Payer: Self-pay

## 2023-12-13 ENCOUNTER — Other Ambulatory Visit: Payer: Self-pay | Admitting: Internal Medicine

## 2023-12-13 ENCOUNTER — Encounter: Payer: Self-pay | Admitting: Pharmacist

## 2023-12-13 DIAGNOSIS — I480 Paroxysmal atrial fibrillation: Secondary | ICD-10-CM

## 2023-12-13 MED ORDER — WARFARIN SODIUM 5 MG PO TABS
ORAL_TABLET | ORAL | 1 refills | Status: DC
Start: 2023-12-13 — End: 2024-08-17
  Filled 2023-12-13: qty 110, 90d supply, fill #0
  Filled 2024-04-19: qty 110, 90d supply, fill #1

## 2023-12-14 ENCOUNTER — Other Ambulatory Visit (HOSPITAL_COMMUNITY): Payer: Self-pay

## 2023-12-14 ENCOUNTER — Ambulatory Visit: Payer: Medicare Other | Attending: Cardiology | Admitting: *Deleted

## 2023-12-14 DIAGNOSIS — Z952 Presence of prosthetic heart valve: Secondary | ICD-10-CM | POA: Diagnosis not present

## 2023-12-14 DIAGNOSIS — Z8679 Personal history of other diseases of the circulatory system: Secondary | ICD-10-CM

## 2023-12-14 DIAGNOSIS — Z9889 Other specified postprocedural states: Secondary | ICD-10-CM

## 2023-12-14 DIAGNOSIS — Z5181 Encounter for therapeutic drug level monitoring: Secondary | ICD-10-CM | POA: Diagnosis not present

## 2023-12-14 LAB — POCT INR: INR: 3.9 — AB (ref 2.0–3.0)

## 2023-12-14 NOTE — Patient Instructions (Addendum)
 Description   Today take 1/2 tablet of warfarin then START taking warfarin 1 tablet daily. Continue taking 1 cranberry supplement every other day (Tu, Th, Sa). Stay consistent with leafy vegetables. Recheck INR in 3 weeks per request.  [facility] [phone]

## 2023-12-17 ENCOUNTER — Other Ambulatory Visit (HOSPITAL_COMMUNITY): Payer: Self-pay

## 2023-12-20 ENCOUNTER — Other Ambulatory Visit (HOSPITAL_COMMUNITY): Payer: Self-pay

## 2023-12-21 ENCOUNTER — Other Ambulatory Visit: Payer: Self-pay

## 2023-12-21 ENCOUNTER — Other Ambulatory Visit (HOSPITAL_COMMUNITY): Payer: Self-pay

## 2023-12-21 MED ORDER — AMOXICILLIN 500 MG PO CAPS
ORAL_CAPSULE | ORAL | 99 refills | Status: DC
  Filled 2023-12-21: qty 16, 4d supply, fill #0

## 2023-12-31 ENCOUNTER — Other Ambulatory Visit (HOSPITAL_COMMUNITY): Payer: Medicare Other

## 2024-01-04 ENCOUNTER — Ambulatory Visit: Payer: Medicare Other | Attending: Internal Medicine

## 2024-01-04 DIAGNOSIS — Z9889 Other specified postprocedural states: Secondary | ICD-10-CM | POA: Diagnosis not present

## 2024-01-04 DIAGNOSIS — Z952 Presence of prosthetic heart valve: Secondary | ICD-10-CM | POA: Insufficient documentation

## 2024-01-04 DIAGNOSIS — Z8679 Personal history of other diseases of the circulatory system: Secondary | ICD-10-CM | POA: Diagnosis not present

## 2024-01-04 DIAGNOSIS — Z5181 Encounter for therapeutic drug level monitoring: Secondary | ICD-10-CM | POA: Insufficient documentation

## 2024-01-04 LAB — POCT INR: INR: 2.8 (ref 2.0–3.0)

## 2024-01-04 NOTE — Patient Instructions (Signed)
 Continue taking warfarin 1 tablet daily. Continue taking 1 cranberry supplement every other day (Tu, Th, Sa). Stay consistent with leafy vegetables. Recheck INR in 3 weeks per request.  [facility] [phone]

## 2024-01-08 ENCOUNTER — Other Ambulatory Visit (HOSPITAL_COMMUNITY): Payer: Self-pay

## 2024-01-10 ENCOUNTER — Other Ambulatory Visit: Payer: Self-pay

## 2024-01-10 ENCOUNTER — Other Ambulatory Visit (HOSPITAL_COMMUNITY): Payer: Self-pay

## 2024-01-14 ENCOUNTER — Ambulatory Visit (HOSPITAL_COMMUNITY): Payer: Medicare Other

## 2024-01-21 ENCOUNTER — Ambulatory Visit: Attending: Cardiology

## 2024-01-21 DIAGNOSIS — Z952 Presence of prosthetic heart valve: Secondary | ICD-10-CM | POA: Insufficient documentation

## 2024-01-21 DIAGNOSIS — Z5181 Encounter for therapeutic drug level monitoring: Secondary | ICD-10-CM | POA: Insufficient documentation

## 2024-01-21 DIAGNOSIS — Z8679 Personal history of other diseases of the circulatory system: Secondary | ICD-10-CM | POA: Insufficient documentation

## 2024-01-21 DIAGNOSIS — Z9889 Other specified postprocedural states: Secondary | ICD-10-CM | POA: Insufficient documentation

## 2024-01-21 LAB — POCT INR: INR: 3.1 — AB (ref 2.0–3.0)

## 2024-01-21 NOTE — Patient Instructions (Addendum)
 Description   Continue taking warfarin 1 tablet daily.  Continue taking 1 cranberry supplement every other day (Tu, Th, Sa). Stay consistent with leafy vegetables. Recheck INR in 4 weeks per request.  [facility] Clinic [phone]

## 2024-01-25 ENCOUNTER — Encounter

## 2024-02-07 ENCOUNTER — Other Ambulatory Visit: Payer: Self-pay

## 2024-02-07 ENCOUNTER — Other Ambulatory Visit (HOSPITAL_COMMUNITY): Payer: Self-pay

## 2024-02-22 ENCOUNTER — Ambulatory Visit

## 2024-02-22 ENCOUNTER — Ambulatory Visit (HOSPITAL_COMMUNITY): Attending: Cardiology

## 2024-02-22 ENCOUNTER — Encounter (HOSPITAL_COMMUNITY): Payer: Self-pay

## 2024-02-22 DIAGNOSIS — Z8679 Personal history of other diseases of the circulatory system: Secondary | ICD-10-CM

## 2024-02-22 DIAGNOSIS — Z952 Presence of prosthetic heart valve: Secondary | ICD-10-CM | POA: Diagnosis not present

## 2024-02-22 DIAGNOSIS — I10 Essential (primary) hypertension: Secondary | ICD-10-CM | POA: Diagnosis not present

## 2024-02-22 DIAGNOSIS — Z9889 Other specified postprocedural states: Secondary | ICD-10-CM

## 2024-02-22 DIAGNOSIS — I361 Nonrheumatic tricuspid (valve) insufficiency: Secondary | ICD-10-CM

## 2024-02-22 DIAGNOSIS — Z5181 Encounter for therapeutic drug level monitoring: Secondary | ICD-10-CM

## 2024-02-22 LAB — POCT INR: INR: 3 (ref 2.0–3.0)

## 2024-02-22 LAB — ECHOCARDIOGRAM COMPLETE
Area-P 1/2: 3.21 cm2
MV VTI: 0.85 cm2
S' Lateral: 4.1 cm

## 2024-02-22 NOTE — Patient Instructions (Signed)
 Description   Continue taking warfarin 1 tablet daily.  Continue taking 1 cranberry supplement every other day (Tu, Th, Sa). Stay consistent with leafy vegetables. Recheck INR in 4 weeks per request.  [facility] Clinic [phone]

## 2024-02-23 ENCOUNTER — Encounter (HOSPITAL_BASED_OUTPATIENT_CLINIC_OR_DEPARTMENT_OTHER): Payer: Self-pay

## 2024-03-07 ENCOUNTER — Other Ambulatory Visit (HOSPITAL_COMMUNITY): Payer: Self-pay

## 2024-03-07 ENCOUNTER — Other Ambulatory Visit: Payer: Self-pay

## 2024-03-07 MED FILL — Lisinopril Tab 5 MG: ORAL | 90 days supply | Qty: 90 | Fill #0 | Status: AC

## 2024-03-08 ENCOUNTER — Other Ambulatory Visit (HOSPITAL_COMMUNITY): Payer: Self-pay

## 2024-03-08 ENCOUNTER — Other Ambulatory Visit: Payer: Self-pay

## 2024-03-16 ENCOUNTER — Other Ambulatory Visit: Payer: Self-pay

## 2024-03-16 ENCOUNTER — Other Ambulatory Visit (HOSPITAL_COMMUNITY): Payer: Self-pay

## 2024-03-21 ENCOUNTER — Ambulatory Visit: Attending: Cardiology

## 2024-03-21 DIAGNOSIS — Z9889 Other specified postprocedural states: Secondary | ICD-10-CM | POA: Diagnosis not present

## 2024-03-21 DIAGNOSIS — Z952 Presence of prosthetic heart valve: Secondary | ICD-10-CM | POA: Insufficient documentation

## 2024-03-21 DIAGNOSIS — Z8679 Personal history of other diseases of the circulatory system: Secondary | ICD-10-CM | POA: Diagnosis not present

## 2024-03-21 DIAGNOSIS — Z5181 Encounter for therapeutic drug level monitoring: Secondary | ICD-10-CM | POA: Diagnosis not present

## 2024-03-21 LAB — POCT INR: INR: 2.9 (ref 2.0–3.0)

## 2024-03-21 NOTE — Patient Instructions (Signed)
 Continue taking warfarin 1 tablet daily.  Continue taking 1 cranberry supplement every other day (Tu, Th, Sa). Stay consistent with leafy vegetables. Recheck INR in 4 weeks per request.  Coumadin  Clinic [phone]

## 2024-03-24 NOTE — Telephone Encounter (Signed)
 No further action needed at this time.

## 2024-04-11 ENCOUNTER — Other Ambulatory Visit (HOSPITAL_COMMUNITY): Payer: Self-pay

## 2024-04-11 DIAGNOSIS — E039 Hypothyroidism, unspecified: Secondary | ICD-10-CM | POA: Diagnosis not present

## 2024-04-11 MED ORDER — ARMOUR THYROID 90 MG PO TABS
90.0000 mg | ORAL_TABLET | Freq: Every morning | ORAL | 5 refills | Status: DC
  Filled 2024-04-11: qty 30, 30d supply, fill #0
  Filled 2024-05-09: qty 30, 30d supply, fill #1
  Filled 2024-05-15: qty 30, 30d supply, fill #0
  Filled 2024-06-17: qty 30, 30d supply, fill #1
  Filled 2024-06-21: qty 30, 30d supply, fill #0
  Filled 2024-07-19: qty 30, 30d supply, fill #1
  Filled 2024-08-17: qty 30, 30d supply, fill #2
  Filled 2024-09-18: qty 30, 30d supply, fill #3

## 2024-04-12 ENCOUNTER — Other Ambulatory Visit: Payer: Self-pay

## 2024-04-18 ENCOUNTER — Ambulatory Visit: Attending: Cardiology

## 2024-04-18 DIAGNOSIS — Z8679 Personal history of other diseases of the circulatory system: Secondary | ICD-10-CM | POA: Insufficient documentation

## 2024-04-18 DIAGNOSIS — Z9889 Other specified postprocedural states: Secondary | ICD-10-CM | POA: Diagnosis not present

## 2024-04-18 DIAGNOSIS — Z952 Presence of prosthetic heart valve: Secondary | ICD-10-CM | POA: Insufficient documentation

## 2024-04-18 DIAGNOSIS — Z5181 Encounter for therapeutic drug level monitoring: Secondary | ICD-10-CM | POA: Insufficient documentation

## 2024-04-18 LAB — POCT INR: INR: 2.5 (ref 2.0–3.0)

## 2024-04-18 NOTE — Patient Instructions (Signed)
 Continue taking warfarin 1 tablet daily.  Continue taking 1 cranberry supplement every other day (Tu, Th, Sa). Stay consistent with leafy vegetables. Recheck INR in 4 weeks per request.  Coumadin  Clinic [phone]

## 2024-04-18 NOTE — Progress Notes (Signed)
Please see anticoagulation encounter.

## 2024-04-19 ENCOUNTER — Other Ambulatory Visit (HOSPITAL_COMMUNITY): Payer: Self-pay

## 2024-04-19 ENCOUNTER — Other Ambulatory Visit: Payer: Self-pay

## 2024-05-09 ENCOUNTER — Other Ambulatory Visit (HOSPITAL_COMMUNITY): Payer: Self-pay

## 2024-05-09 ENCOUNTER — Other Ambulatory Visit: Payer: Self-pay

## 2024-05-09 DIAGNOSIS — N951 Menopausal and female climacteric states: Secondary | ICD-10-CM | POA: Diagnosis not present

## 2024-05-10 ENCOUNTER — Other Ambulatory Visit (HOSPITAL_COMMUNITY): Payer: Self-pay

## 2024-05-10 ENCOUNTER — Other Ambulatory Visit: Payer: Self-pay

## 2024-05-15 ENCOUNTER — Other Ambulatory Visit: Payer: Self-pay

## 2024-05-15 ENCOUNTER — Other Ambulatory Visit (HOSPITAL_COMMUNITY): Payer: Self-pay

## 2024-05-16 ENCOUNTER — Ambulatory Visit

## 2024-05-19 ENCOUNTER — Ambulatory Visit: Attending: Cardiology

## 2024-05-19 DIAGNOSIS — Z5181 Encounter for therapeutic drug level monitoring: Secondary | ICD-10-CM | POA: Insufficient documentation

## 2024-05-19 DIAGNOSIS — Z8679 Personal history of other diseases of the circulatory system: Secondary | ICD-10-CM | POA: Diagnosis not present

## 2024-05-19 DIAGNOSIS — Z9889 Other specified postprocedural states: Secondary | ICD-10-CM | POA: Insufficient documentation

## 2024-05-19 DIAGNOSIS — Z952 Presence of prosthetic heart valve: Secondary | ICD-10-CM | POA: Diagnosis not present

## 2024-05-19 LAB — POCT INR: INR: 2.3 (ref 2.0–3.0)

## 2024-05-19 NOTE — Progress Notes (Signed)
 INR 2.3. Please see anticoagulation encounter

## 2024-05-19 NOTE — Patient Instructions (Signed)
 Take 1.5 tablets today only then Continue taking warfarin 1 tablet daily.  Continue taking 1 cranberry supplement every other day (Tu, Th, Sa). Stay consistent with leafy vegetables. Recheck INR in 3 weeks per request.  Coumadin  Clinic [phone]

## 2024-06-09 ENCOUNTER — Ambulatory Visit

## 2024-06-17 ENCOUNTER — Other Ambulatory Visit (HOSPITAL_COMMUNITY): Payer: Self-pay

## 2024-06-17 MED FILL — Lisinopril Tab 5 MG: ORAL | 90 days supply | Qty: 90 | Fill #1 | Status: AC

## 2024-06-18 ENCOUNTER — Other Ambulatory Visit (HOSPITAL_COMMUNITY): Payer: Self-pay

## 2024-06-20 ENCOUNTER — Other Ambulatory Visit: Payer: Self-pay

## 2024-06-20 ENCOUNTER — Ambulatory Visit: Attending: Cardiology | Admitting: *Deleted

## 2024-06-20 ENCOUNTER — Encounter: Payer: Self-pay | Admitting: Pharmacist

## 2024-06-20 ENCOUNTER — Other Ambulatory Visit (HOSPITAL_COMMUNITY): Payer: Self-pay

## 2024-06-20 DIAGNOSIS — Z5181 Encounter for therapeutic drug level monitoring: Secondary | ICD-10-CM | POA: Insufficient documentation

## 2024-06-20 DIAGNOSIS — Z8679 Personal history of other diseases of the circulatory system: Secondary | ICD-10-CM | POA: Insufficient documentation

## 2024-06-20 DIAGNOSIS — Z9889 Other specified postprocedural states: Secondary | ICD-10-CM | POA: Insufficient documentation

## 2024-06-20 DIAGNOSIS — Z952 Presence of prosthetic heart valve: Secondary | ICD-10-CM | POA: Diagnosis not present

## 2024-06-20 LAB — POCT INR: INR: 2.6 (ref 2.0–3.0)

## 2024-06-20 NOTE — Patient Instructions (Addendum)
 Description   Today take 1.5 tablets of warfarin then continue taking warfarin 1 tablet daily.  Continue taking 1 cranberry supplement every other day (Tu, Th, Sa). Stay consistent with leafy vegetables. Recheck INR in 3 weeks per request.  Coumadin  Clinic [phone]

## 2024-06-20 NOTE — Progress Notes (Cosign Needed Addendum)
  Description   Today take 1.5 tablets of warfarin then continue taking warfarin 1 tablet daily.  Continue taking 1 cranberry supplement every other day (Tu, Th, Sa). Stay consistent with leafy vegetables. Recheck INR in 3 weeks per request.  Coumadin  Clinic [phone]

## 2024-06-21 ENCOUNTER — Other Ambulatory Visit (HOSPITAL_COMMUNITY): Payer: Self-pay

## 2024-06-22 ENCOUNTER — Other Ambulatory Visit (HOSPITAL_COMMUNITY): Payer: Self-pay

## 2024-06-22 ENCOUNTER — Other Ambulatory Visit: Payer: Self-pay

## 2024-06-28 ENCOUNTER — Other Ambulatory Visit (HOSPITAL_COMMUNITY): Payer: Self-pay

## 2024-06-28 ENCOUNTER — Other Ambulatory Visit: Payer: Self-pay

## 2024-07-07 ENCOUNTER — Encounter: Payer: Self-pay | Admitting: Family Medicine

## 2024-07-07 ENCOUNTER — Ambulatory Visit: Admitting: Family Medicine

## 2024-07-07 ENCOUNTER — Other Ambulatory Visit: Payer: Self-pay

## 2024-07-07 ENCOUNTER — Other Ambulatory Visit (HOSPITAL_COMMUNITY): Payer: Self-pay

## 2024-07-07 VITALS — BP 130/64 | HR 77 | Temp 98.1°F

## 2024-07-07 DIAGNOSIS — Z23 Encounter for immunization: Secondary | ICD-10-CM

## 2024-07-07 DIAGNOSIS — D229 Melanocytic nevi, unspecified: Secondary | ICD-10-CM | POA: Diagnosis not present

## 2024-07-07 DIAGNOSIS — Z0000 Encounter for general adult medical examination without abnormal findings: Secondary | ICD-10-CM | POA: Diagnosis not present

## 2024-07-07 DIAGNOSIS — R21 Rash and other nonspecific skin eruption: Secondary | ICD-10-CM | POA: Diagnosis not present

## 2024-07-07 MED ORDER — CLOTRIMAZOLE 1 % EX CREA
1.0000 | TOPICAL_CREAM | Freq: Two times a day (BID) | CUTANEOUS | 0 refills | Status: AC
  Filled 2024-07-07: qty 60, 30d supply, fill #0

## 2024-07-07 NOTE — Progress Notes (Signed)
 [name] [name] , 01-May-1951, 73 y.o., female MRN: [id number] Patient Care Team    Relationship Specialty Notifications Start End  Catherine Charlies LABOR, DO PCP - General Family Medicine  07/31/16   Waddell Danelle ORN, MD PCP - Electrophysiology Cardiology  07/14/22   Okey Vina GAILS, MD PCP - Cardiology Cardiology  11/18/23   Brien Belvie BRAVO, MD Attending Physician Pulmonary Disease Yes. All abnormal results, Admissions 03/03/12   Magdalen Pasco RAMAN, DPM Consulting Physician Podiatry  08/11/16   Maranda Leim DEL, MD Consulting Physician Cardiology  08/11/16   Edna Norris, MD  Alternative Medicine  08/11/16   Ditty, Morene Hicks, MD Consulting Physician Neurosurgery  08/11/16   Lavoie, Marie-Lyne, MD Consulting Physician Obstetrics and Gynecology  09/18/16   Robynn Mems  Dentistry  09/18/16   Ivin Kocher, MD Consulting Physician Dermatology  09/18/16   Skeet Juliene SAUNDERS, DO Consulting Physician Neurology  12/19/18   Camillo Golas, MD Consulting Physician Ophthalmology  03/28/19    Comment: [place] [facility]    Chief Complaint  Patient presents with   Rash    A few months; round white spot underneath L arm. Denies irritation/pain. Pt inquiring about labs to test for parasite.    Medicare Wellness     Subjective: VALETA [name] is a 73 y.o. Pt presents for an OV with complaints of area under left axilla concerning for ringworm.  She denies any pain, irritation, redness or itchiness.  She states she was washing underneath her arm and noticed it a few months ago.  It has not changed since that time.   Patient reports she would like referral back to dermatology for a mole on her abdomen.  [name] quite a few years ago for the same mall and at the time they were going to watch it.  She would like them to look at it again.      07/07/2024    1:33 PM 06/02/2023    2:40 PM 08/12/2022   12:02 PM 05/20/2022    2:52 PM 02/04/2022    1:28 PM  Depression screen PHQ 2/9   Decreased Interest 0 0 0 0 0  Down, Depressed, Hopeless 0 0 0 0 0  PHQ - 2 Score 0 0 0 0 0    Allergies  Allergen Reactions   Erythromycin Nausea And Vomiting   Atorvastatin  Other (See Comments)    Pt reports causes body to be stiff, joints ache, aching in knees and ankles, feels more fatigued.    Lactose Intolerance (Gi) Other (See Comments)    Upset stomach - yoghurt   Valsartan  Other (See Comments)    Pt reports caused her depression   Social History   Social History Narrative   Patient is a widower (since 80), he currently lives with her son and daughter-in-law. She has 2 children.   She is college educated, and retired from the [employer].   She uses herbal remedies, and multiple over-the-counter supplements. She takes a daily vitamin.   She wears her seatbelt, exercises routinely, smoke detector in the home.   Requires a walker or wheelchair at times.   Feels safe in her relationships.   Past Medical History:  Diagnosis Date   Anxiety    Arthritis    knees   Breast cancer (HCC) 1999   COPD (chronic obstructive pulmonary disease) (HCC)    COPD with emphysema.. Assess by pulmonary team in the hospital April, 2013  GI bleed    GIB (gastrointestinal bleeding) 01/20/2022   Herpes    Hypothyroidism    IBS (irritable bowel syndrome)    Mitral valve regurgitation    Mitral valve replacement April, 2013, Mitral valve prolapse   Multiple sclerosis (HCC)    Neurogenic bladder    NICM (nonischemic cardiomyopathy) (HCC)    Osteoporosis    Ovarian cyst    Paroxysmal atrial fibrillation (HCC)    Rapid atrial fibrillation in-hospital, Rapid cardioversion,  before mitral valve surgery   Pulmonary hypertension (HCC)    Echo, April, 2013, before mitral valve surgery   PVC's (premature ventricular contractions)    S/P Maze operation for atrial fibrillation 01/26/2012   Complete biatrial lesion set using cryothermy via right mini thoracotomy   S/P mitral valve  replacement 01/26/2012   31mm Sorin [employer]omed[employer] Optiform mechanical prosthesis via right mini thoracotomy   Stroke (cerebrum) (HCC)    Tibial plateau fracture, right 01/01/2022   Vertebral artery stenosis    right   Warfarin anticoagulation    Mechanical mitral prosthesis, April, [id number]   Past Surgical History:  Procedure Laterality Date   BREAST LUMPECTOMY Right 1999   with sent.node, and axillary dissection (20)   CHEST TUBE INSERTION  01/26/2012   Procedure: CHEST TUBE INSERTION;  Surgeon: Sudie VEAR Laine, MD;  Location: [facility] OR;  Service: Open Heart Surgery;  Laterality: Left;   COLONOSCOPY  2010   normal   CYSTOSCOPY  1992   LAPAROSCOPIC OVARIAN CYSTECTOMY  1978   urethral stricture repair   LEFT AND RIGHT HEART CATHETERIZATION WITH CORONARY ANGIOGRAM N/A 01/20/2012   Procedure: LEFT AND RIGHT HEART CATHETERIZATION WITH CORONARY ANGIOGRAM;  Surgeon: Lonni JONETTA Cash, MD;  Location: [facility] LAB;  Service: Cardiovascular;  Laterality: N/A;   LYMPHADENECTOMY     MAZE  01/26/2012   Procedure: MAZE;  Surgeon: Sudie VEAR Laine, MD;  Location: [facility] OR;  Service: Open Heart Surgery;  Laterality: N/A;   MITRAL VALVE REPLACEMENT  01/26/2012   Procedure: MINIMALLY INVASIVE MITRAL VALVE (MV) REPLACEMENT;  Surgeon: Sudie VEAR Laine, MD;  Location: [facility] OR;  Service: Open Heart Surgery;  Laterality: Right;   ORIF TIBIA PLATEAU Right 01/05/2022   Procedure: OPEN REDUCTION INTERNAL FIXATION (ORIF) TIBIAL PLATEAU AND SHAFT;  Surgeon: Celena Sharper, MD;  Location: [facility] OR;  Service: Orthopedics;  Laterality: Right;   TEE WITHOUT CARDIOVERSION  01/19/2012   Procedure: TRANSESOPHAGEAL ECHOCARDIOGRAM (TEE);  Surgeon: Peter M Swaziland, MD;  Location: [facility] ENDOSCOPY;  Service: Cardiovascular;  Laterality: N/A;   TONSILLECTOMY  1970   UMBILICAL HERNIA REPAIR  1952   WRIST SURGERY Right 2012   Family History  Problem Relation Age of Onset   Heart disease Father        cardiac arrest    CAD Father    Hypertension  Father    CAD Mother        5 stents and numerous bypass surgery   Hypertension Mother    Hyperlipidemia Mother    CAD Other    Breast cancer Maternal Grandmother    Breast cancer Maternal Aunt    Allergies as of 07/07/2024       Reactions   Erythromycin Nausea And Vomiting   Atorvastatin  Other (See Comments)   Pt reports causes body to be stiff, joints ache, aching in knees and ankles, feels more fatigued.    Lactose Intolerance (gi) Other (See Comments)   Upset stomach - yoghurt   Valsartan  Other (See Comments)   Pt  reports caused her depression        Medication List        Accurate as of July 07, 2024  3:39 PM. If you have any questions, ask your nurse or doctor.          STOP taking these medications    amoxicillin  500 MG capsule Commonly known as: AMOXIL  Stopped by: Charlies Bellini   fluticasone  50 MCG/ACT nasal spray Commonly known as: FLONASE  Stopped by: Charlies Bellini       TAKE these medications    Armour Thyroid  90 MG tablet Generic drug: thyroid  Take 1 tablet (90 mg total) by mouth in the morning 30 minutes before food. What changed: Another medication with the same name was removed. Continue taking this medication, and follow the directions you see here. Changed by: Charlies Bellini   Biotin  10 MG Caps Take 1 capsule by mouth 2 (two) times daily.   calcium  carbonate 1250 (500 Ca) MG tablet Commonly known as: OS-CAL - dosed in mg of elemental calcium  Take 1 tablet by mouth. Taking 1,500 mg by mouth every morning   clotrimazole  1 % cream Commonly known as: Clotrimazole  Anti-Fungal Apply 1 Application topically 2 (two) times daily for 28 days. Started by: Charlies Bellini   Co Q-10 100 MG Caps Take 100 mg by mouth every morning.   Cranberry 500 MG Caps Take 500 mg by mouth daily.   Fish Oil 1200 MG Caps Take 1,200 mg by mouth 2 (two) times daily with a meal.   glucosamine-chondroitin 500-400 MG tablet Take 1 tablet by mouth 2 (two) times  daily with a meal.   lisinopril  5 MG tablet Commonly known as: ZESTRIL  Take 1 tablet (5 mg total) by mouth daily.   Lysine 500 MG Caps Take 1,000 mg by mouth 2 (two) times daily.   Magnesium  Glycinate 100 MG Caps Take by mouth.   metoprolol  tartrate 25 MG tablet Commonly known as: LOPRESSOR  Take 1 tablet (25 mg total) by mouth 2 (two) times daily.   progesterone  100 MG capsule Commonly known as: PROMETRIUM  Take 100 mg by mouth every morning.   triamcinolone  cream 0.1 % Commonly known as: KENALOG  Apply 1 Application topically 2 (two) times daily. What changed: when to take this   vitamin A 10000 UNIT capsule Take 10,000 Units by mouth every evening.   Vitamin B-12 1000 MCG Subl Place 1,000 mcg under the tongue every morning.   Vitamin D3 50 MCG (2000 UT) Tabs Take 2,000-4,000 Units by mouth See admin instructions. Take one tablet (2000 units) by mouth every other day and take 2 tablets (4000 units) every other day   vitamin E 180 MG (400 UNITS) capsule Take 400 Units by mouth every evening.   warfarin 5 MG tablet Commonly known as: COUMADIN  Take as directed by the anticoagulation clinic. If you are unsure how to take this medication, talk to your nurse or doctor. Original instructions: Take 1 tablet by mouth daily except 1.5 tablets on Tuesdays and Thursdays or as directed Anticoagulation Clinic.        All past medical history, surgical history, allergies, family history, immunizations andmedications were updated in the EMR today and reviewed under the history and medication portions of their EMR.     ROS Negative, with the exception of above mentioned in HPI   Objective:  BP 130/64   Pulse 77   Temp 98.1 F (36.7 C)   SpO2 97%  There is no height or weight on file to  calculate BMI. Physical Exam Vitals and nursing note reviewed.  Constitutional:      General: She is not in acute distress.    Appearance: Normal appearance. She is not ill-appearing,  toxic-appearing or diaphoretic.  HENT:     Head: Normocephalic and atraumatic.  Eyes:     General: No scleral icterus.       Right eye: No discharge.        Left eye: No discharge.     Extraocular Movements: Extraocular movements intact.     Conjunctiva/sclera: Conjunctivae normal.     Pupils: Pupils are equal, round, and reactive to light.  Cardiovascular:     Rate and Rhythm: Normal rate and regular rhythm.  Pulmonary:     Effort: Pulmonary effort is normal. No respiratory distress.     Breath sounds: Normal breath sounds. No wheezing, rhonchi or rales.  Musculoskeletal:     Right lower leg: No edema.     Left lower leg: No edema.  Skin:    General: Skin is warm.     Findings: No rash.     Comments: Left axilla: Round hyperpigmented lesion with central sparing noted.  No erythema or scaling is present. Abdomen: 4 mm raised hyperpigmented mole present.  Neurological:     Mental Status: She is alert and oriented to person, place, and time. Mental status is at baseline.     Motor: No weakness.     Gait: Gait normal.  Psychiatric:        Mood and Affect: Mood normal.        Behavior: Behavior normal.        Thought Content: Thought content normal.        Judgment: Judgment normal.     No results found. No results found. No results found. However, due to the size of the patient record, not all encounters were searched. Please check Results Review for a complete set of results.  Assessment/Plan: KIMYATA MILICH is a 73 y.o. female present for OV for  Influenza vaccine needed (Primary) Declined  Medicare annual wellness visit, subsequent Completed today  Atypical mole Appears benign, referral back to dermatology for recheck - Ambulatory referral to Dermatology  Rash Area is round with central sparing.  Not certain it is a tinea infection versus hyperpigmentation. Elected to treat with clotrimazole  twice daily for 3 to 4 weeks.  If area does not resolve, would not  expect her to need additional treatment unless symptomatic or becoming larger, suspect that his likely hyperpigmentation of skin.  Reviewed expectations re: course of current medical issues. Discussed self-management of symptoms. Outlined signs and symptoms indicating need for more acute intervention. Patient verbalized understanding and all questions were answered. Patient received an After-Visit Summary.    Orders Placed This Encounter  Procedures   Ambulatory referral to Dermatology   Meds ordered this encounter  Medications   clotrimazole  (CLOTRIMAZOLE  ANTI-FUNGAL) 1 % cream    Sig: Apply 1 Application topically 2 (two) times daily for 28 days.    Dispense:  60 g    Refill:  0   Referral Orders         Ambulatory referral to Dermatology       Note is dictated utilizing voice recognition software. Although note has been proof read prior to signing, occasional typographical errors still can be missed. If any questions arise, please do not hesitate to call for verification.   electronically signed by:  Charlies Bellini, DO  [facility] -  OR

## 2024-07-07 NOTE — Progress Notes (Addendum)
 Subjective:   [name] [name] is a 73 y.o. female who presents for Medicare Annual (Subsequent) preventive examination.  Visit Complete: In person  Patient Medicare AWV questionnaire was completed by the patient on 07/07/24; I have confirmed that all information answered by patient is correct and no changes since this date.  Cardiac Risk Factors include: advanced age (>83men, >13 women);hypertension     Objective:    Today's Vitals   07/07/24 1310  BP: 130/64  Pulse: 77  Temp: 98.1 F (36.7 C)  SpO2: 97%   There is no height or weight on file to calculate BMI.     07/07/2024    1:37 PM 07/23/2023   10:40 AM 06/28/2023    1:23 PM 06/02/2023    2:42 PM 05/20/2022    2:53 PM 02/04/2022    2:55 PM 01/20/2022    5:00 PM  Advanced Directives  Does Patient Have a Medical Advance Directive? Yes No No Yes Yes Yes   Type of Estate agent of [place];Living will   Healthcare Power of [place];Living will Healthcare Power of [employer] Power of Attorney   Does patient want to make changes to medical advance directive? No - Patient declined   No - Patient declined  No - Patient declined No - Patient declined  Copy of Healthcare Power of Attorney in Chart? Yes - validated most recent copy scanned in chart (See row information)   Yes - validated most recent copy scanned in chart (See row information) Yes - validated most recent copy scanned in chart (See row information)    Would patient like information on creating a medical advance directive?  No - Patient declined    No - Patient declined Yes (Inpatient - patient defers creating a medical advance directive and declines information at this time)    Current Medications (verified) Outpatient Encounter Medications as of 07/07/2024  Medication Sig   ARMOUR THYROID  90 MG tablet Take 1 tablet (90 mg total) by mouth in the morning 30 minutes before food.   Biotin  10 MG CAPS Take 1 capsule by mouth 2 (two) times daily.    calcium  carbonate (OS-CAL - DOSED IN MG OF ELEMENTAL CALCIUM ) 1250 (500 Ca) MG tablet Take 1 tablet by mouth. Taking 1,500 mg by mouth every morning   Cholecalciferol  (VITAMIN D3) 50 MCG (2000 UT) TABS Take 2,000-4,000 Units by mouth See admin instructions. Take one tablet (2000 units) by mouth every other day and take 2 tablets (4000 units) every other day   clotrimazole  (CLOTRIMAZOLE  ANTI-FUNGAL) 1 % cream Apply 1 Application topically 2 (two) times daily for 28 days.   Coenzyme Q10 (CO Q-10) 100 MG CAPS Take 100 mg by mouth every morning.   Cranberry 500 MG CAPS Take 500 mg by mouth daily.   Cyanocobalamin  (VITAMIN B-12) 1000 MCG SUBL Place 1,000 mcg under the tongue every morning.   glucosamine-chondroitin 500-400 MG tablet Take 1 tablet by mouth 2 (two) times daily with a meal.   lisinopril  (ZESTRIL ) 5 MG tablet Take 1 tablet (5 mg total) by mouth daily.   Lysine 500 MG CAPS Take 1,000 mg by mouth 2 (two) times daily.   Magnesium  Glycinate 100 MG CAPS Take by mouth.   metoprolol  tartrate (LOPRESSOR ) 25 MG tablet Take 1 tablet (25 mg total) by mouth 2 (two) times daily.   Omega-3 Fatty Acids (FISH OIL) 1200 MG CAPS Take 1,200 mg by mouth 2 (two) times daily with a meal.   progesterone  (PROMETRIUM )  100 MG capsule Take 100 mg by mouth every morning.   triamcinolone  cream (KENALOG ) 0.1 % Apply 1 Application topically 2 (two) times daily. (Patient taking differently: Apply 1 Application topically daily.)   vitamin A 10000 UNIT capsule Take 10,000 Units by mouth every evening.   vitamin E 400 UNIT capsule Take 400 Units by mouth every evening.   warfarin (COUMADIN ) 5 MG tablet Take 1 tablet by mouth daily except 1.5 tablets on Tuesdays and Thursdays or as directed Anticoagulation Clinic.   [DISCONTINUED] amoxicillin  (AMOXIL ) 500 MG capsule Take 4 capsules 1 hour prior to dental treatment.   [DISCONTINUED] fluticasone  (FLONASE ) 50 MCG/ACT nasal spray Place 1 spray into both nostrils daily.  (Patient taking differently: Place 1 spray into both nostrils every morning.)   [DISCONTINUED] thyroid  (ARMOUR) 90 MG tablet Take 90 mg by mouth every morning.   No facility-administered encounter medications on file as of 07/07/2024.    Allergies (verified) Erythromycin, Atorvastatin , Lactose intolerance (gi), and Valsartan    History: Past Medical History:  Diagnosis Date   Anxiety    Arthritis    knees   Breast cancer (HCC) 1999   COPD (chronic obstructive pulmonary disease) (HCC)    COPD with emphysema.. Assess by pulmonary team in the hospital April, 2013   GI bleed    GIB (gastrointestinal bleeding) 01/20/2022   Herpes    Hypothyroidism    IBS (irritable bowel syndrome)    Mitral valve regurgitation    Mitral valve replacement April, 2013, Mitral valve prolapse   Multiple sclerosis (HCC)    Neurogenic bladder    NICM (nonischemic cardiomyopathy) (HCC)    Osteoporosis    Ovarian cyst    Paroxysmal atrial fibrillation (HCC)    Rapid atrial fibrillation in-hospital, Rapid cardioversion,  before mitral valve surgery   Pulmonary hypertension (HCC)    Echo, April, 2013, before mitral valve surgery   PVC's (premature ventricular contractions)    S/P Maze operation for atrial fibrillation 01/26/2012   Complete biatrial lesion set using cryothermy via right mini thoracotomy   S/P mitral valve replacement 01/26/2012   31mm Sorin [employer]omed[employer] Optiform mechanical prosthesis via right mini thoracotomy   Stroke (cerebrum) (HCC)    Tibial plateau fracture, right 01/01/2022   Vertebral artery stenosis    right   Warfarin anticoagulation    Mechanical mitral prosthesis, April, [id number]   Past Surgical History:  Procedure Laterality Date   BREAST LUMPECTOMY Right 1999   with sent.node, and axillary dissection (20)   CHEST TUBE INSERTION  01/26/2012   Procedure: CHEST TUBE INSERTION;  Surgeon: Sudie VEAR Laine, MD;  Location: [facility] OR;  Service: Open Heart Surgery;  Laterality: Left;    COLONOSCOPY  2010   normal   CYSTOSCOPY  1992   LAPAROSCOPIC OVARIAN CYSTECTOMY  1978   urethral stricture repair   LEFT AND RIGHT HEART CATHETERIZATION WITH CORONARY ANGIOGRAM N/A 01/20/2012   Procedure: LEFT AND RIGHT HEART CATHETERIZATION WITH CORONARY ANGIOGRAM;  Surgeon: Lonni JONETTA Cash, MD;  Location: [facility] CATH LAB;  Service: Cardiovascular;  Laterality: N/A;   LYMPHADENECTOMY     MAZE  01/26/2012   Procedure: MAZE;  Surgeon: Sudie VEAR Laine, MD;  Location: [facility] OR;  Service: Open Heart Surgery;  Laterality: N/A;   MITRAL VALVE REPLACEMENT  01/26/2012   Procedure: MINIMALLY INVASIVE MITRAL VALVE (MV) REPLACEMENT;  Surgeon: Sudie VEAR Laine, MD;  Location: [facility] OR;  Service: Open Heart Surgery;  Laterality: Right;   ORIF TIBIA PLATEAU Right 01/05/2022   Procedure: OPEN  REDUCTION INTERNAL FIXATION (ORIF) TIBIAL PLATEAU AND SHAFT;  Surgeon: Celena Sharper, MD;  Location: [facility] OR;  Service: Orthopedics;  Laterality: Right;   TEE WITHOUT CARDIOVERSION  01/19/2012   Procedure: TRANSESOPHAGEAL ECHOCARDIOGRAM (TEE);  Surgeon: Peter M Swaziland, MD;  Location[facility] ENDOSCOPY;  Service: Cardiovascular;  Laterality: N/A;   TONSILLECTOMY  1970   UMBILICAL HERNIA REPAIR  1952   WRIST SURGERY Right 2012   Family History  Problem Relation Age of Onset   Heart disease Father        cardiac arrest    CAD Father    Hypertension Father    CAD Mother        5 stents and numerous bypass surgery   Hypertension Mother    Hyperlipidemia Mother    CAD Other    Breast cancer Maternal Grandmother    Breast cancer Maternal Aunt    Social History   Socioeconomic History   Marital status: Widowed    Spouse name: Not on file   Number of children: 2   Years of education: 16   Highest education level: Bachelor's degree (e.g., BA, AB, BS)  Occupational History   Occupation: Retired  Tobacco Use   Smoking status: Never   Smokeless tobacco: Never  Vaping Use   Vaping status: Never Used  Substance and Sexual  Activity   Alcohol use: No   Drug use: No   Sexual activity: Not Currently    Partners: Male    Birth control/protection: Post-menopausal  Other Topics Concern   Not on file  Social History Narrative   Patient is a widower (since 89), he currently lives with her son and daughter-in-law. She has 2 children.   She is college educated, and retired from the [employer].   She uses herbal remedies, and multiple over-the-counter supplements. She takes a daily vitamin.   She wears her seatbelt, exercises routinely, smoke detector in the home.   Requires a walker or wheelchair at times.   Feels safe in her relationships.   Social Drivers of Health   Financial Resource Strain: Medium Risk (07/07/2024)   Overall Financial Resource Strain (CARDIA)    Difficulty of Paying Living Expenses: Somewhat hard  Food Insecurity: Food Insecurity Present (07/07/2024)   Hunger Vital Sign    Worried About Running Out of Food in the Last Year: Sometimes true    Ran Out of Food in the Last Year: Never true  Transportation Needs: Unmet Transportation Needs (07/07/2024)   PRAPARE - Administrator, Civil Service (Medical): Yes    Lack of Transportation (Non-Medical): No  Physical Activity: Insufficiently Active (07/07/2024)   Exercise Vital Sign    Days of Exercise per Week: 7 days    Minutes of Exercise per Session: 20 min  Stress: No Stress Concern Present (07/07/2024)   [employer] of Occupational Health - Occupational Stress Questionnaire    Feeling of Stress: Only a little  Social Connections: Socially Isolated (07/07/2024)   Social Connection and Isolation Panel    Frequency of Communication with Friends and Family: More than three times a week    Frequency of Social Gatherings with Friends and Family: Three times a week    Attends Religious Services: Never    Active Member of Clubs or Organizations: No    Attends Banker Meetings: Not on file    Marital Status:  Widowed    Tobacco Counseling Counseling given: Not Answered   Clinical Intake:  Pre-visit preparation completed:  No  Pain : No/denies pain     Nutritional Risks: None Diabetes: No  How often do you need to have someone help you when you read instructions, pamphlets, or other written materials from your doctor or pharmacy?: 1 - Never  Interpreter Needed?: No      Activities of Daily Living    07/07/2024    1:38 PM  In your present state of health, do you have any difficulty performing the following activities:  Hearing? 0  Vision? 0  Difficulty concentrating or making decisions? 0  Walking or climbing stairs? 1  Dressing or bathing? 0  Doing errands, shopping? 1  Preparing Food and eating ? N  Using the Toilet? N  In the past six months, have you accidently leaked urine? Y  Do you have problems with loss of bowel control? N  Managing your Medications? N  Managing your Finances? N  Housekeeping or managing your Housekeeping? N    Patient Care Team: Catherine Charlies LABOR, DO as PCP - General (Family Medicine) Waddell Danelle ORN, MD as PCP - Electrophysiology (Cardiology) Okey Vina GAILS, MD as PCP - Cardiology (Cardiology) Brien Belvie BRAVO, MD as Attending Physician (Pulmonary Disease) Magdalen Pasco RAMAN, DPM as Consulting Physician (Podiatry) Maranda Leim DEL, MD as Consulting Physician (Cardiology) Edna Norris, MD (Alternative Medicine) Ditty, Morene Hicks, MD as Consulting Physician (Neurosurgery) Lavoie, Marie-Lyne, MD as Consulting Physician (Obstetrics and Gynecology) Robynn Mems (Dentistry) Ivin Kocher, MD as Consulting Physician (Dermatology) Skeet Juliene SAUNDERS, DO as Consulting Physician (Neurology) Camillo Golas, MD as Consulting Physician (Ophthalmology)  Indicate any recent Medical Services you may have received from other than [facility] providers in the past year (date may be approximate).     Assessment:   This is a routine wellness examination for  [facility].  Hearing/Vision screen No results found.   Goals Addressed               This Visit's Progress     Patient Stated (pt-stated)        Exercise more       Depression Screen    07/07/2024    1:33 PM 06/02/2023    2:40 PM 08/12/2022   12:02 PM 05/20/2022    2:52 PM 02/04/2022    1:28 PM 05/14/2021    3:04 PM 02/06/2021    2:46 PM  PHQ 2/9 Scores  PHQ - 2 Score 0 0 0 0 0 0 0    Fall Risk    07/07/2024    1:37 PM 08/20/2023    1:22 PM 07/22/2023   11:12 AM 07/12/2023   11:30 AM 06/02/2023   12:40 PM  Fall Risk   Falls in the past year? 0 1 0 0 0  Number falls in past yr: 0 0   0  Injury with Fall? 0 1   0  Risk for fall due to : No Fall Risks History of fall(s);Impaired vision   Impaired vision  Follow up Falls evaluation completed Falls evaluation completed   Falls prevention discussed    MEDICARE RISK AT HOME: Medicare Risk at Home Any stairs in or around the home?: Yes If so, are there any without handrails?: No Home free of loose throw rugs in walkways, pet beds, electrical cords, etc?: Yes Adequate lighting in your home to reduce risk of falls?: Yes Life alert?: Yes Use of a cane, walker or w/c?: Yes Grab bars in the bathroom?: Yes Shower chair or bench in shower?: Yes Elevated toilet seat or  a handicapped toilet?: Yes  TIMED UP AND GO:  Was the test performed?  Yes  Length of time to ambulate 10 feet: 29 sec Gait slow and steady with assistive device    Cognitive Function:        07/07/2024    1:40 PM 06/02/2023    2:43 PM 05/20/2022    2:56 PM  6CIT Screen  What Year? 0 points 0 points 0 points  What month? 0 points 0 points 0 points  What time? 0 points 0 points 0 points  Count back from 20 0 points 0 points 0 points  Months in reverse 0 points 0 points 0 points  Repeat phrase 0 points 0 points 0 points  Total Score 0 points 0 points 0 points    Immunizations Immunization History  Administered Date(s) Administered   Td 10/21/2003    Tdap 02/17/2011    TDAP status: Due, Education has been provided regarding the importance of this vaccine. Advised may receive this vaccine at local pharmacy or Health Dept. Aware to provide a copy of the vaccination record if obtained from local pharmacy or Health Dept. Verbalized acceptance and understanding.  Flu Vaccine status: Declined, Education has been provided regarding the importance of this vaccine but patient still declined. Advised may receive this vaccine at local pharmacy or Health Dept. Aware to provide a copy of the vaccination record if obtained from local pharmacy or Health Dept. Verbalized acceptance and understanding.  Pneumococcal vaccine status: Declined,  Education has been provided regarding the importance of this vaccine but patient still declined. Advised may receive this vaccine at local pharmacy or Health Dept. Aware to provide a copy of the vaccination record if obtained from local pharmacy or Health Dept. Verbalized acceptance and understanding.   Covid-19 vaccine status: Declined, Education has been provided regarding the importance of this vaccine but patient still declined. Advised may receive this vaccine at local pharmacy or Health Dept.or vaccine clinic. Aware to provide a copy of the vaccination record if obtained from local pharmacy or Health Dept. Verbalized acceptance and understanding.  Qualifies for Shingles Vaccine? Yes   Zostavax completed No   Shingrix Completed?: No.    Education has been provided regarding the importance of this vaccine. Patient has been advised to call insurance company to determine out of pocket expense if they have not yet received this vaccine. Advised may also receive vaccine at local pharmacy or Health Dept. Verbalized acceptance and understanding.  Screening Tests Health Maintenance  Topic Date Due   Influenza Vaccine  01/16/2025 (Originally 05/19/2024)   Medicare Annual Wellness (AWV)  07/07/2025   Fecal DNA (Cologuard)   10/26/2026   DEXA SCAN  Completed   Hepatitis C Screening  Completed   HPV VACCINES  Aged Out   Meningococcal B Vaccine  Aged Out   DTaP/Tdap/Td  Discontinued   Pneumococcal Vaccine: 50+ Years  Discontinued   Mammogram  Discontinued   COVID-19 Vaccine  Discontinued   Zoster Vaccines- Shingrix  Discontinued    Health Maintenance  There are no preventive care reminders to display for this patient.  Colorectal cancer screening: Type of screening: Cologuard. Completed 10/27/23. Repeat every 3 years  Mammogram status: No longer required due to age.  Bone Density status: Completed 08/07/21. Results reflect: Bone density results: OSTEOPOROSIS. Repeat every 2 years.  Lung Cancer Screening: (Low Dose CT Chest recommended if Age 53-80 years, 20 pack-year currently smoking OR have quit w/in 15years.) does not qualify.   Lung Cancer Screening  Referral: n/a  Additional Screening:  Hepatitis C Screening: does qualify; Completed 10/19/2013  Vision Screening: Recommended annual ophthalmology exams for early detection of glaucoma and other disorders of the eye. Is the patient up to date with their annual eye exam?  Yes  Who is the provider or what is the name of the office in which the patient attends annual eye exams? Camillo Golas, MD If pt is not established with a provider, would they like to be referred to a provider to establish care? No .   Dental Screening: Recommended annual dental exams for proper oral hygiene    Community Resource Referral / Chronic Care Management: CRR required this visit?  No   CCM required this visit?  No     Plan:     I have personally reviewed and noted the following in the patient's chart:   Medical and social history Use of alcohol, tobacco or illicit drugs  Current medications and supplements including opioid prescriptions. Patient is not currently taking opioid prescriptions. Functional ability and status Nutritional status Physical  activity Advanced directives List of other physicians Hospitalizations, surgeries, and ER visits in previous 12 months Vitals Screenings to include cognitive, depression, and falls Referrals and appointments  In addition, I have reviewed and discussed with patient certain preventive protocols, quality metrics, and best practice recommendations. A written personalized care plan for preventive services as well as general preventive health recommendations were provided to patient.     Shanda LELON Sharps, CMA   07/07/2024   After Visit Summary: (In Person-Declined) Patient declined AVS at this time.  Nurse Notes: Non-Face to Face or Face to Face 15 minute visit Encounter   Ms. Roberta , Thank you for taking time to come for your Medicare Wellness Visit. I appreciate your ongoing commitment to your health goals. Please review the following plan we discussed and let me know if I can assist you in the future.   These are the goals we discussed:  Goals       <enter goal here> (pt-stated)      to be able to walk again      Patient Stated      Would like to chiropractor To improve balance/better posture        Patient Stated      None at this time       Patient Stated      To be able to use the cane       Patient Stated (pt-stated)      Exercise more        This is a list of the screening recommended for you and due dates:  Health Maintenance  Topic Date Due   Flu Shot  01/16/2025*   Medicare Annual Wellness Visit  07/07/2025   Cologuard (Stool DNA test)  10/26/2026   DEXA scan (bone density measurement)  Completed   Hepatitis C Screening  Completed   HPV Vaccine  Aged Out   Meningitis B Vaccine  Aged Out   DTaP/Tdap/Td vaccine  Discontinued   Pneumococcal Vaccine for age over 34  Discontinued   Breast Cancer Screening  Discontinued   COVID-19 Vaccine  Discontinued   Zoster (Shingles) Vaccine  Discontinued  *Topic was postponed. The date shown is not the original due  date.

## 2024-07-07 NOTE — Patient Instructions (Addendum)
 Return in 1 year (on 07/07/2025).        Great to see you today.  I have refilled the medication(s) we provide.   If labs were collected or images ordered, we will inform you of  results once we have received them and reviewed. We will contact you either by echart message, or telephone call.  Please give ample time to the testing facility, and our office to run,  receive and review results. Please do not call inquiring of results, even if you can see them in your chart. We will contact you as soon as we are able. If it has been over 1 week since the test was completed, and you have not yet heard from us , then please call us .    - echart message- for normal results that have been seen by the patient already.   - telephone call: abnormal results or if patient has not viewed results in their echart.  If a referral to a specialist was entered for you, please call us  in 2 weeks if you have not heard from the specialist office to schedule.   Health Maintenance, Female Adopting a healthy lifestyle and getting preventive care are important in promoting health and wellness. Ask your health care provider about: The right schedule for you to have regular tests and exams. Things you can do on your own to prevent diseases and keep yourself healthy. What should I know about diet, weight, and exercise? Eat a healthy diet  Eat a diet that includes plenty of vegetables, fruits, low-fat dairy products, and lean protein. Do not eat a lot of foods that are high in solid fats, added sugars, or sodium. Maintain a healthy weight Body mass index (BMI) is used to identify weight problems. It estimates body fat based on height and weight. Your health care provider can help determine your BMI and help you achieve or maintain a healthy weight. Get regular exercise Get regular exercise. This is one of the most important things you can do for your health. Most adults should: Exercise for at least 150 minutes each  week. The exercise should increase your heart rate and make you sweat (moderate-intensity exercise). Do strengthening exercises at least twice a week. This is in addition to the moderate-intensity exercise. Spend less time sitting. Even light physical activity can be beneficial. Watch cholesterol and blood lipids Have your blood tested for lipids and cholesterol at 75 years of age, then have this test every 5 years. Have your cholesterol levels checked more often if: Your lipid or cholesterol levels are high. You are older than 75 years of age. You are at high risk for heart disease. What should I know about cancer screening? Depending on your health history and family history, you may need to have cancer screening at various ages. This may include screening for: Breast cancer. Cervical cancer. Colorectal cancer. Skin cancer. Lung cancer. What should I know about heart disease, diabetes, and high blood pressure? Blood pressure and heart disease High blood pressure causes heart disease and increases the risk of stroke. This is more likely to develop in people who have high blood pressure readings or are overweight. Have your blood pressure checked: Every 3-5 years if you are 54-55 years of age. Every year if you are 4 years old or older. Diabetes Have regular diabetes screenings. This checks your fasting blood sugar level. Have the screening done: Once every three years after age 46 if you are at a normal weight and have  a low risk for diabetes. More often and at a younger age if you are overweight or have a high risk for diabetes. What should I know about preventing infection? Hepatitis B If you have a higher risk for hepatitis B, you should be screened for this virus. Talk with your health care provider to find out if you are at risk for hepatitis B infection. Hepatitis C Testing is recommended for: Everyone born from [birth year] through 1967. Anyone with known risk factors for hepatitis  C. Sexually transmitted infections (STIs) Get screened for STIs, including gonorrhea and chlamydia, if: You are sexually active and are younger than 75 years of age. You are older than 75 years of age and your health care provider tells you that you are at risk for this type of infection. Your sexual activity has changed since you were last screened, and you are at increased risk for chlamydia or gonorrhea. Ask your health care provider if you are at risk. Ask your health care provider about whether you are at high risk for HIV. Your health care provider may recommend a prescription medicine to help prevent HIV infection. If you choose to take medicine to prevent HIV, you should first get tested for HIV. You should then be tested every 3 months for as long as you are taking the medicine. Pregnancy If you are about to stop having your period (premenopausal) and you may become pregnant, seek counseling before you get pregnant. Take 400 to 800 micrograms (mcg) of folic acid every day if you become pregnant. Ask for birth control (contraception) if you want to prevent pregnancy. Osteoporosis and menopause Osteoporosis is a disease in which the bones lose minerals and strength with aging. This can result in bone fractures. If you are 28 years old or older, or if you are at risk for osteoporosis and fractures, ask your health care provider if you should: Be screened for bone loss. Take a calcium  or vitamin D  supplement to lower your risk of fractures. Be given hormone replacement therapy (HRT) to treat symptoms of menopause. Follow these instructions at home: Alcohol use Do not drink alcohol if: Your health care provider tells you not to drink. You are pregnant, may be pregnant, or are planning to become pregnant. If you drink alcohol: Limit how much you have to: 0-1 drink a day. Know how much alcohol is in your drink. In the [place] one drink equals one 12 oz bottle of beer (355 mL), one 5 oz glass  of wine (148 mL), or one 1 oz glass of hard liquor (44 mL). Lifestyle Do not use any products that contain nicotine or tobacco. These products include cigarettes, chewing tobacco, and vaping devices, such as e-cigarettes. If you need help quitting, ask your health care provider. Do not use street drugs. Do not share needles. Ask your health care provider for help if you need support or information about quitting drugs. General instructions Schedule regular health, dental, and eye exams. Stay current with your vaccines. Tell your health care provider if: You often feel depressed. You have ever been abused or do not feel safe at home. Summary Adopting a healthy lifestyle and getting preventive care are important in promoting health and wellness. Follow your health care provider's instructions about healthy diet, exercising, and getting tested or screened for diseases. Follow your health care provider's instructions on monitoring your cholesterol and blood pressure. This information is not intended to replace advice given to you by your health care provider. Make sure  you discuss any questions you have with your health care provider. Document Revised: 02/24/2021 Document Reviewed: 02/24/2021 Elsevier Patient Education  2024 [employer].

## 2024-07-10 ENCOUNTER — Encounter: Payer: Self-pay | Admitting: Pharmacist

## 2024-07-10 ENCOUNTER — Other Ambulatory Visit: Payer: Self-pay

## 2024-07-12 ENCOUNTER — Encounter

## 2024-07-13 ENCOUNTER — Other Ambulatory Visit (HOSPITAL_COMMUNITY): Payer: Self-pay

## 2024-07-13 ENCOUNTER — Ambulatory Visit: Attending: Cardiology | Admitting: *Deleted

## 2024-07-13 ENCOUNTER — Other Ambulatory Visit: Payer: Self-pay

## 2024-07-13 DIAGNOSIS — Z952 Presence of prosthetic heart valve: Secondary | ICD-10-CM | POA: Insufficient documentation

## 2024-07-13 DIAGNOSIS — Z5181 Encounter for therapeutic drug level monitoring: Secondary | ICD-10-CM | POA: Insufficient documentation

## 2024-07-13 DIAGNOSIS — Z8679 Personal history of other diseases of the circulatory system: Secondary | ICD-10-CM | POA: Diagnosis not present

## 2024-07-13 DIAGNOSIS — Z9889 Other specified postprocedural states: Secondary | ICD-10-CM | POA: Diagnosis not present

## 2024-07-13 LAB — POCT INR: POC INR: 3.8

## 2024-07-13 NOTE — Progress Notes (Signed)
 Lab Results  Component Value Date   INR 3.8 07/13/2024   INR 2.6 06/20/2024   INR 2.3 05/19/2024    Description   INR: 3.8, Hold warfarin today and then continue taking warfarin 1 tablet daily.  Continue taking 1 cranberry supplement every other day (Tu, Th, Sa). Stay consistent with leafy vegetables. Recheck INR in 4 weeks per request.  [facility] [phone]

## 2024-07-13 NOTE — Patient Instructions (Addendum)
 Description   INR: 3.8, Hold warfarin today and then continue taking warfarin 1 tablet daily.  Continue taking 1 cranberry supplement every other day (Tu, Th, Sa). Stay consistent with leafy vegetables. Recheck INR in 4 weeks per request.  [facility] [phone]

## 2024-07-18 ENCOUNTER — Encounter

## 2024-07-19 ENCOUNTER — Other Ambulatory Visit (HOSPITAL_COMMUNITY): Payer: Self-pay

## 2024-08-10 ENCOUNTER — Ambulatory Visit: Attending: Cardiovascular Disease

## 2024-08-10 DIAGNOSIS — Z952 Presence of prosthetic heart valve: Secondary | ICD-10-CM | POA: Diagnosis not present

## 2024-08-10 DIAGNOSIS — Z5181 Encounter for therapeutic drug level monitoring: Secondary | ICD-10-CM | POA: Insufficient documentation

## 2024-08-10 DIAGNOSIS — Z8679 Personal history of other diseases of the circulatory system: Secondary | ICD-10-CM | POA: Diagnosis not present

## 2024-08-10 DIAGNOSIS — Z9889 Other specified postprocedural states: Secondary | ICD-10-CM | POA: Diagnosis not present

## 2024-08-10 LAB — POCT INR: INR: 3.3 — AB (ref 2.0–3.0)

## 2024-08-10 NOTE — Patient Instructions (Signed)
 continue taking warfarin 1 tablet daily.  Continue taking 1 cranberry supplement every other day (Tu, Th, Sa). Stay consistent with leafy vegetables. Recheck INR in 4 weeks per request.  [facility] [phone]

## 2024-08-10 NOTE — Progress Notes (Signed)
 INR 3.3 Please see anticoagulation encounter  continue taking warfarin 1 tablet daily.  Continue taking 1 cranberry supplement every other day (Tu, Th, Sa). Stay consistent with leafy vegetables. Recheck INR in 4 weeks per request.  [facility] [phone]

## 2024-08-17 ENCOUNTER — Other Ambulatory Visit: Payer: Self-pay | Admitting: Cardiology

## 2024-08-17 ENCOUNTER — Other Ambulatory Visit: Payer: Self-pay

## 2024-08-17 ENCOUNTER — Other Ambulatory Visit (HOSPITAL_COMMUNITY): Payer: Self-pay

## 2024-08-17 DIAGNOSIS — I480 Paroxysmal atrial fibrillation: Secondary | ICD-10-CM

## 2024-08-17 MED ORDER — WARFARIN SODIUM 5 MG PO TABS
ORAL_TABLET | ORAL | 1 refills | Status: AC
  Filled 2024-08-17: qty 100, 87d supply, fill #0

## 2024-08-18 ENCOUNTER — Other Ambulatory Visit: Payer: Self-pay

## 2024-08-22 ENCOUNTER — Other Ambulatory Visit (HOSPITAL_COMMUNITY): Payer: Self-pay

## 2024-08-23 ENCOUNTER — Other Ambulatory Visit: Payer: Self-pay

## 2024-08-24 ENCOUNTER — Telehealth: Payer: Self-pay | Admitting: *Deleted

## 2024-08-24 NOTE — Telephone Encounter (Signed)
 Pt called and stated that she missed 3 days of warfarin because she was out. Pt stated that she does not have transportation to get to another appointment so will keep INR appointment for 11/18 at 3:30. Instructed pt to take 2 tablets of warfarin today (10mg ) and 1 and 1/2 tablets of warfarin (7.5mg ) tomorrow then continue normal dose of warfarin 1 tablet daily. Informed pt that warfarin is a medication that  we do not want her to miss and if she is ever out then to call our clinic. Pt thanked me for the call.

## 2024-09-05 ENCOUNTER — Ambulatory Visit: Attending: Internal Medicine | Admitting: *Deleted

## 2024-09-05 DIAGNOSIS — Z5181 Encounter for therapeutic drug level monitoring: Secondary | ICD-10-CM | POA: Insufficient documentation

## 2024-09-05 DIAGNOSIS — Z8679 Personal history of other diseases of the circulatory system: Secondary | ICD-10-CM | POA: Insufficient documentation

## 2024-09-05 DIAGNOSIS — Z952 Presence of prosthetic heart valve: Secondary | ICD-10-CM | POA: Insufficient documentation

## 2024-09-05 DIAGNOSIS — Z9889 Other specified postprocedural states: Secondary | ICD-10-CM | POA: Insufficient documentation

## 2024-09-05 LAB — POCT INR: INR: 3.2 — AB (ref 2.0–3.0)

## 2024-09-05 NOTE — Patient Instructions (Signed)
 Description   INR-3.2; Continue taking warfarin 1 tablet daily.  Continue taking 1 cranberry supplement every other day (Tu, Th, Sa). Stay consistent with leafy vegetables. Recheck INR in 5 weeks. [facility] [phone]

## 2024-09-05 NOTE — Progress Notes (Signed)
 Description   INR-3.2; Continue taking warfarin 1 tablet daily.  Continue taking 1 cranberry supplement every other day (Tu, Th, Sa). Stay consistent with leafy vegetables. Recheck INR in 5 weeks. [facility] [phone]

## 2024-09-07 ENCOUNTER — Ambulatory Visit

## 2024-09-18 ENCOUNTER — Other Ambulatory Visit (HOSPITAL_COMMUNITY): Payer: Self-pay

## 2024-09-18 MED FILL — Lisinopril Tab 5 MG: ORAL | 90 days supply | Qty: 90 | Fill #2 | Status: CN

## 2024-09-27 ENCOUNTER — Other Ambulatory Visit (HOSPITAL_COMMUNITY): Payer: Self-pay

## 2024-09-27 DIAGNOSIS — H524 Presbyopia: Secondary | ICD-10-CM | POA: Diagnosis not present

## 2024-09-27 DIAGNOSIS — H52222 Regular astigmatism, left eye: Secondary | ICD-10-CM | POA: Diagnosis not present

## 2024-09-27 DIAGNOSIS — H5213 Myopia, bilateral: Secondary | ICD-10-CM | POA: Diagnosis not present

## 2024-09-27 DIAGNOSIS — H0102A Squamous blepharitis right eye, upper and lower eyelids: Secondary | ICD-10-CM | POA: Diagnosis not present

## 2024-09-27 DIAGNOSIS — H43811 Vitreous degeneration, right eye: Secondary | ICD-10-CM | POA: Diagnosis not present

## 2024-09-27 DIAGNOSIS — H2513 Age-related nuclear cataract, bilateral: Secondary | ICD-10-CM | POA: Diagnosis not present

## 2024-09-27 DIAGNOSIS — H0102B Squamous blepharitis left eye, upper and lower eyelids: Secondary | ICD-10-CM | POA: Diagnosis not present

## 2024-09-27 MED FILL — Lisinopril Tab 5 MG: ORAL | 90 days supply | Qty: 90 | Fill #2 | Status: AC

## 2024-10-09 ENCOUNTER — Ambulatory Visit: Attending: Cardiology | Admitting: Pharmacist

## 2024-10-09 DIAGNOSIS — Z952 Presence of prosthetic heart valve: Secondary | ICD-10-CM | POA: Insufficient documentation

## 2024-10-09 DIAGNOSIS — Z9889 Other specified postprocedural states: Secondary | ICD-10-CM | POA: Insufficient documentation

## 2024-10-09 DIAGNOSIS — Z5181 Encounter for therapeutic drug level monitoring: Secondary | ICD-10-CM | POA: Insufficient documentation

## 2024-10-09 DIAGNOSIS — Z8679 Personal history of other diseases of the circulatory system: Secondary | ICD-10-CM | POA: Insufficient documentation

## 2024-10-09 LAB — POCT INR: INR: 3.2 — AB (ref 2.0–3.0)

## 2024-10-09 NOTE — Progress Notes (Signed)
 Description   INR-3.2; Continue taking warfarin 1 tablet daily.  Continue taking 1 cranberry supplement every other day (Tu, Th, Sa). Stay consistent with leafy vegetables. Recheck INR in 5 weeks. [facility] [phone]

## 2024-10-09 NOTE — Patient Instructions (Signed)
 Description   INR-3.2; Continue taking warfarin 1 tablet daily.  Continue taking 1 cranberry supplement every other day (Tu, Th, Sa). Stay consistent with leafy vegetables. Recheck INR in 5 weeks. [facility] [phone]

## 2024-10-24 ENCOUNTER — Other Ambulatory Visit: Payer: Self-pay

## 2024-10-24 ENCOUNTER — Other Ambulatory Visit (HOSPITAL_COMMUNITY): Payer: Self-pay

## 2024-10-24 MED ORDER — ARMOUR THYROID 90 MG PO TABS
90.0000 mg | ORAL_TABLET | Freq: Every morning | ORAL | 5 refills | Status: AC
  Filled 2024-10-24: qty 30, 30d supply, fill #0

## 2024-10-25 ENCOUNTER — Other Ambulatory Visit (HOSPITAL_COMMUNITY): Payer: Self-pay

## 2024-10-25 ENCOUNTER — Other Ambulatory Visit: Payer: Self-pay

## 2024-11-13 ENCOUNTER — Ambulatory Visit

## 2024-11-15 ENCOUNTER — Ambulatory Visit

## 2024-11-23 ENCOUNTER — Ambulatory Visit

## 2024-12-07 ENCOUNTER — Ambulatory Visit

## 2025-07-11 ENCOUNTER — Ambulatory Visit
